# Patient Record
Sex: Female | Born: 1950
Health system: Southern US, Community
[De-identification: ages and names within clinical notes are randomized; demographics above are authoritative.]

## PROBLEM LIST (undated history)

## (undated) DIAGNOSIS — I639 Cerebral infarction, unspecified: Secondary | ICD-10-CM

## (undated) DIAGNOSIS — J209 Acute bronchitis, unspecified: Secondary | ICD-10-CM

## (undated) DIAGNOSIS — E669 Obesity, unspecified: Secondary | ICD-10-CM

## (undated) DIAGNOSIS — Z8249 Family history of ischemic heart disease and other diseases of the circulatory system: Secondary | ICD-10-CM

## (undated) DIAGNOSIS — G2581 Restless legs syndrome: Secondary | ICD-10-CM

## (undated) DIAGNOSIS — K589 Irritable bowel syndrome without diarrhea: Secondary | ICD-10-CM

## (undated) DIAGNOSIS — R079 Chest pain, unspecified: Secondary | ICD-10-CM

## (undated) DIAGNOSIS — M1990 Unspecified osteoarthritis, unspecified site: Secondary | ICD-10-CM

## (undated) DIAGNOSIS — F329 Major depressive disorder, single episode, unspecified: Secondary | ICD-10-CM

## (undated) DIAGNOSIS — I509 Heart failure, unspecified: Secondary | ICD-10-CM

## (undated) DIAGNOSIS — F32A Depression, unspecified: Secondary | ICD-10-CM

## (undated) DIAGNOSIS — J45909 Unspecified asthma, uncomplicated: Secondary | ICD-10-CM

## (undated) DIAGNOSIS — I5032 Chronic diastolic (congestive) heart failure: Secondary | ICD-10-CM

## (undated) DIAGNOSIS — I10 Essential (primary) hypertension: Secondary | ICD-10-CM

## (undated) DIAGNOSIS — G4733 Obstructive sleep apnea (adult) (pediatric): Secondary | ICD-10-CM

## (undated) DIAGNOSIS — F419 Anxiety disorder, unspecified: Secondary | ICD-10-CM

## (undated) DIAGNOSIS — E538 Deficiency of other specified B group vitamins: Secondary | ICD-10-CM

## (undated) DIAGNOSIS — D649 Anemia, unspecified: Secondary | ICD-10-CM

## (undated) DIAGNOSIS — J449 Chronic obstructive pulmonary disease, unspecified: Secondary | ICD-10-CM

## (undated) HISTORY — PX: TUBAL LIGATION: SHX77

## (undated) HISTORY — DX: Heart failure, unspecified: I50.9

## (undated) HISTORY — DX: Obesity, unspecified: E66.9

## (undated) HISTORY — DX: Chronic diastolic (congestive) heart failure: I50.32

## (undated) HISTORY — DX: Essential (primary) hypertension: I10

## (undated) HISTORY — DX: Anxiety disorder, unspecified: F41.9

## (undated) HISTORY — DX: Acute bronchitis, unspecified: J20.9

## (undated) HISTORY — DX: Depression, unspecified: F32.A

## (undated) HISTORY — DX: Deficiency of other specified B group vitamins: E53.8

## (undated) HISTORY — DX: Major depressive disorder, single episode, unspecified: F32.9

## (undated) HISTORY — DX: Restless legs syndrome: G25.81

## (undated) HISTORY — DX: Chronic obstructive pulmonary disease, unspecified: J44.9

## (undated) HISTORY — DX: Unspecified asthma, uncomplicated: J45.909

## (undated) HISTORY — DX: Cerebral infarction, unspecified: I63.9

## (undated) HISTORY — DX: Family history of ischemic heart disease and other diseases of the circulatory system: Z82.49

## (undated) HISTORY — PX: CHOLECYSTECTOMY: SHX55

## (undated) HISTORY — DX: Obstructive sleep apnea (adult) (pediatric): G47.33

## (undated) HISTORY — DX: Chest pain, unspecified: R07.9

## (undated) HISTORY — DX: Irritable bowel syndrome, unspecified: K58.9

---

## 1993-05-20 DIAGNOSIS — I639 Cerebral infarction, unspecified: Secondary | ICD-10-CM

## 1993-05-20 HISTORY — DX: Cerebral infarction, unspecified: I63.9

## 1996-05-20 HISTORY — PX: OTHER SURGICAL HISTORY: SHX169

## 2004-08-27 ENCOUNTER — Encounter: Payer: Self-pay | Admitting: General Practice

## 2004-09-17 ENCOUNTER — Encounter: Payer: Self-pay | Admitting: General Practice

## 2004-09-26 ENCOUNTER — Ambulatory Visit: Payer: Self-pay | Admitting: Specialist

## 2004-10-12 ENCOUNTER — Ambulatory Visit: Payer: Self-pay | Admitting: Unknown Physician Specialty

## 2004-12-04 ENCOUNTER — Ambulatory Visit: Payer: Self-pay

## 2005-03-07 ENCOUNTER — Ambulatory Visit: Payer: Self-pay

## 2005-05-02 ENCOUNTER — Ambulatory Visit: Payer: Self-pay

## 2006-06-24 ENCOUNTER — Emergency Department: Payer: Self-pay | Admitting: Unknown Physician Specialty

## 2006-06-24 ENCOUNTER — Other Ambulatory Visit: Payer: Self-pay

## 2007-03-21 ENCOUNTER — Other Ambulatory Visit: Payer: Self-pay

## 2007-03-21 ENCOUNTER — Emergency Department: Payer: Self-pay | Admitting: Emergency Medicine

## 2007-07-05 ENCOUNTER — Ambulatory Visit: Payer: Self-pay | Admitting: Family Medicine

## 2007-07-13 ENCOUNTER — Ambulatory Visit: Payer: Self-pay | Admitting: Family Medicine

## 2007-08-27 ENCOUNTER — Ambulatory Visit: Payer: Self-pay | Admitting: Family Medicine

## 2007-11-25 ENCOUNTER — Ambulatory Visit: Payer: Self-pay | Admitting: General Practice

## 2008-03-02 ENCOUNTER — Ambulatory Visit: Payer: Self-pay | Admitting: General Practice

## 2008-03-03 ENCOUNTER — Ambulatory Visit: Payer: Self-pay | Admitting: Family Medicine

## 2008-08-02 ENCOUNTER — Ambulatory Visit: Payer: Self-pay | Admitting: Family Medicine

## 2008-09-05 ENCOUNTER — Inpatient Hospital Stay: Payer: Self-pay | Admitting: Internal Medicine

## 2008-09-20 ENCOUNTER — Ambulatory Visit: Payer: Self-pay | Admitting: Family Medicine

## 2008-12-02 ENCOUNTER — Ambulatory Visit: Payer: Self-pay | Admitting: Family Medicine

## 2009-01-10 ENCOUNTER — Ambulatory Visit: Payer: Self-pay | Admitting: Family Medicine

## 2009-03-23 ENCOUNTER — Ambulatory Visit: Payer: Self-pay | Admitting: Family Medicine

## 2010-01-05 ENCOUNTER — Ambulatory Visit: Payer: Self-pay | Admitting: Family Medicine

## 2010-02-13 ENCOUNTER — Other Ambulatory Visit: Payer: Self-pay | Admitting: Family Medicine

## 2010-02-13 ENCOUNTER — Ambulatory Visit: Payer: Self-pay | Admitting: Cardiology

## 2010-03-15 ENCOUNTER — Other Ambulatory Visit: Payer: Self-pay | Admitting: Family Medicine

## 2010-04-04 ENCOUNTER — Ambulatory Visit: Payer: Self-pay | Admitting: Family Medicine

## 2010-05-04 ENCOUNTER — Emergency Department: Payer: Self-pay | Admitting: Emergency Medicine

## 2010-06-19 ENCOUNTER — Ambulatory Visit: Payer: Self-pay | Admitting: Family Medicine

## 2010-07-03 ENCOUNTER — Ambulatory Visit: Payer: Self-pay

## 2010-08-03 ENCOUNTER — Ambulatory Visit: Payer: Self-pay | Admitting: General Practice

## 2010-08-10 ENCOUNTER — Ambulatory Visit: Payer: Self-pay | Admitting: General Practice

## 2010-08-28 ENCOUNTER — Inpatient Hospital Stay: Payer: Self-pay | Admitting: Internal Medicine

## 2010-09-03 LAB — PATHOLOGY REPORT

## 2010-10-28 ENCOUNTER — Ambulatory Visit: Payer: Self-pay

## 2010-11-04 ENCOUNTER — Ambulatory Visit: Payer: Self-pay | Admitting: Family Medicine

## 2010-11-30 ENCOUNTER — Ambulatory Visit: Payer: Self-pay | Admitting: General Practice

## 2010-12-17 ENCOUNTER — Ambulatory Visit: Payer: Self-pay | Admitting: General Practice

## 2011-01-07 ENCOUNTER — Encounter: Payer: Self-pay | Admitting: General Practice

## 2011-01-19 ENCOUNTER — Encounter: Payer: Self-pay | Admitting: General Practice

## 2012-01-22 ENCOUNTER — Emergency Department: Payer: Self-pay | Admitting: Emergency Medicine

## 2012-01-22 LAB — CBC WITH DIFFERENTIAL/PLATELET
Basophil #: 0.1 10*3/uL (ref 0.0–0.1)
Eosinophil #: 0.3 10*3/uL (ref 0.0–0.7)
HCT: 35.8 % (ref 35.0–47.0)
HGB: 12 g/dL (ref 12.0–16.0)
Lymphocyte %: 24.7 %
MCHC: 33.4 g/dL (ref 32.0–36.0)
MCV: 83 fL (ref 80–100)
Monocyte #: 0.6 x10 3/mm (ref 0.2–0.9)
Neutrophil #: 5.8 10*3/uL (ref 1.4–6.5)
Neutrophil %: 64.7 %
RBC: 4.33 10*6/uL (ref 3.80–5.20)
RDW: 14.3 % (ref 11.5–14.5)
WBC: 9 10*3/uL (ref 3.6–11.0)

## 2012-01-22 LAB — COMPREHENSIVE METABOLIC PANEL
Albumin: 3.3 g/dL — ABNORMAL LOW (ref 3.4–5.0)
Alkaline Phosphatase: 135 U/L (ref 50–136)
Bilirubin,Total: 0.3 mg/dL (ref 0.2–1.0)
Chloride: 106 mmol/L (ref 98–107)
Creatinine: 0.97 mg/dL (ref 0.60–1.30)
EGFR (African American): >60
EGFR (Non-African Amer.): >60
Glucose: 89 mg/dL (ref 65–99)
Osmolality: 281 (ref 275–301)
Potassium: 3.9 mmol/L (ref 3.5–5.1)
SGOT(AST): 21 U/L (ref 15–37)
SGPT (ALT): 22 U/L (ref 12–78)
Sodium: 141 mmol/L (ref 136–145)
Total Protein: 7.5 g/dL (ref 6.4–8.2)

## 2012-01-22 LAB — PROTIME-INR
INR: 1
Prothrombin Time: 13.6 secs (ref 11.5–14.7)

## 2012-01-22 LAB — APTT: Activated PTT: 28 secs (ref 23.6–35.9)

## 2012-01-24 ENCOUNTER — Other Ambulatory Visit: Payer: Self-pay | Admitting: Family Medicine

## 2012-01-24 LAB — COMPREHENSIVE METABOLIC PANEL
Alkaline Phosphatase: 131 U/L (ref 50–136)
Anion Gap: 5 — ABNORMAL LOW (ref 7–16)
Bilirubin,Total: 0.3 mg/dL (ref 0.2–1.0)
Calcium, Total: 9.2 mg/dL (ref 8.5–10.1)
Creatinine: 0.97 mg/dL (ref 0.60–1.30)
EGFR (African American): >60
EGFR (Non-African Amer.): >60
Glucose: 84 mg/dL (ref 65–99)
Potassium: 4.2 mmol/L (ref 3.5–5.1)
SGOT(AST): 20 U/L (ref 15–37)

## 2012-02-26 ENCOUNTER — Emergency Department: Payer: Self-pay | Admitting: Emergency Medicine

## 2012-02-26 LAB — URINALYSIS, COMPLETE
Bilirubin,UR: NEGATIVE
Ketone: NEGATIVE
Protein: NEGATIVE
RBC,UR: 1 /HPF (ref 0–5)
WBC UR: 1 /HPF (ref 0–5)

## 2012-02-26 LAB — COMPREHENSIVE METABOLIC PANEL
Anion Gap: 9 (ref 7–16)
BUN: 9 mg/dL (ref 7–18)
Bilirubin,Total: 0.4 mg/dL (ref 0.2–1.0)
Chloride: 104 mmol/L (ref 98–107)
Creatinine: 0.82 mg/dL (ref 0.60–1.30)
EGFR (Non-African Amer.): >60
Osmolality: 284 (ref 275–301)
Potassium: 3.2 mmol/L — ABNORMAL LOW (ref 3.5–5.1)
Sodium: 143 mmol/L (ref 136–145)
Total Protein: 7.3 g/dL (ref 6.4–8.2)

## 2012-02-26 LAB — CBC
HCT: 37.2 % (ref 35.0–47.0)
MCH: 26.7 pg (ref 26.0–34.0)
Platelet: 255 10*3/uL (ref 150–440)
RBC: 4.55 10*6/uL (ref 3.80–5.20)
WBC: 6.5 10*3/uL (ref 3.6–11.0)

## 2012-02-26 LAB — TROPONIN I: Troponin-I: <0.02 ng/mL

## 2012-04-27 ENCOUNTER — Ambulatory Visit: Payer: Self-pay | Admitting: General Practice

## 2012-04-27 LAB — BASIC METABOLIC PANEL
Co2: 29 mmol/L (ref 21–32)
Creatinine: 0.87 mg/dL (ref 0.60–1.30)
EGFR (African American): >60
EGFR (Non-African Amer.): >60
Osmolality: 276 (ref 275–301)
Sodium: 138 mmol/L (ref 136–145)

## 2012-04-27 LAB — URINALYSIS, COMPLETE
Bilirubin,UR: NEGATIVE
Glucose,UR: NEGATIVE mg/dL (ref 0–75)
Hyaline Cast: 1
Specific Gravity: 1.014 (ref 1.003–1.030)
Squamous Epithelial: 2

## 2012-04-27 LAB — CBC
HCT: 35.6 % (ref 35.0–47.0)
HGB: 11.7 g/dL — ABNORMAL LOW (ref 12.0–16.0)
MCV: 80 fL (ref 80–100)
Platelet: 237 10*3/uL (ref 150–440)
RBC: 4.44 10*6/uL (ref 3.80–5.20)
WBC: 7.7 10*3/uL (ref 3.6–11.0)

## 2012-04-27 LAB — PROTIME-INR: INR: 1

## 2012-04-29 LAB — URINE CULTURE

## 2012-05-11 ENCOUNTER — Inpatient Hospital Stay: Payer: Self-pay | Admitting: General Practice

## 2012-05-12 LAB — BASIC METABOLIC PANEL
Anion Gap: 6 — ABNORMAL LOW (ref 7–16)
BUN: 13 mg/dL (ref 7–18)
Chloride: 104 mmol/L (ref 98–107)
Co2: 27 mmol/L (ref 21–32)
Creatinine: 1.01 mg/dL (ref 0.60–1.30)
EGFR (African American): >60
Glucose: 111 mg/dL — ABNORMAL HIGH (ref 65–99)
Potassium: 3.8 mmol/L (ref 3.5–5.1)
Sodium: 137 mmol/L (ref 136–145)

## 2012-05-13 LAB — BASIC METABOLIC PANEL
Anion Gap: 6 — ABNORMAL LOW (ref 7–16)
Calcium, Total: 8.2 mg/dL — ABNORMAL LOW (ref 8.5–10.1)
Chloride: 103 mmol/L (ref 98–107)
Co2: 27 mmol/L (ref 21–32)
Creatinine: 0.97 mg/dL (ref 0.60–1.30)
EGFR (African American): >60
Osmolality: 272 (ref 275–301)
Potassium: 3.8 mmol/L (ref 3.5–5.1)

## 2012-05-13 LAB — PLATELET COUNT: Platelet: 213 10*3/uL (ref 150–440)

## 2012-05-20 HISTORY — PX: JOINT REPLACEMENT: SHX530

## 2012-05-20 HISTORY — PX: TOTAL KNEE ARTHROPLASTY: SHX125

## 2012-06-01 ENCOUNTER — Encounter: Payer: Self-pay | Admitting: General Practice

## 2012-06-20 ENCOUNTER — Encounter: Payer: Self-pay | Admitting: General Practice

## 2013-06-04 ENCOUNTER — Ambulatory Visit: Payer: Self-pay | Admitting: Family Medicine

## 2013-09-22 ENCOUNTER — Inpatient Hospital Stay: Payer: Self-pay | Admitting: Internal Medicine

## 2013-09-22 LAB — CBC WITH DIFFERENTIAL/PLATELET
BASOS ABS: 0.1 10*3/uL (ref 0.0–0.1)
BASOS PCT: 0.8 %
Eosinophil #: 0.1 10*3/uL (ref 0.0–0.7)
Eosinophil %: 1.5 %
HCT: 35.5 % (ref 35.0–47.0)
HGB: 11.3 g/dL — ABNORMAL LOW (ref 12.0–16.0)
LYMPHS PCT: 11.4 %
Lymphocyte #: 1.1 10*3/uL (ref 1.0–3.6)
MCH: 23.9 pg — ABNORMAL LOW (ref 26.0–34.0)
MCHC: 31.9 g/dL — AB (ref 32.0–36.0)
MCV: 75 fL — ABNORMAL LOW (ref 80–100)
Monocyte #: 0.6 x10 3/mm (ref 0.2–0.9)
Monocyte %: 5.8 %
NEUTROS PCT: 80.5 %
Neutrophil #: 8 10*3/uL — ABNORMAL HIGH (ref 1.4–6.5)
Platelet: 180 10*3/uL (ref 150–440)
RBC: 4.74 10*6/uL (ref 3.80–5.20)
RDW: 16 % — AB (ref 11.5–14.5)
WBC: 9.9 10*3/uL (ref 3.6–11.0)

## 2013-09-22 LAB — BASIC METABOLIC PANEL
Anion Gap: 7 (ref 7–16)
BUN: 14 mg/dL (ref 7–18)
CALCIUM: 8.8 mg/dL (ref 8.5–10.1)
CHLORIDE: 100 mmol/L (ref 98–107)
Co2: 25 mmol/L (ref 21–32)
Creatinine: 1.26 mg/dL (ref 0.60–1.30)
GFR CALC AF AMER: 53 — AB
GFR CALC NON AF AMER: 46 — AB
Glucose: 114 mg/dL — ABNORMAL HIGH (ref 65–99)
Osmolality: 266 (ref 275–301)
POTASSIUM: 4.4 mmol/L (ref 3.5–5.1)
Sodium: 132 mmol/L — ABNORMAL LOW (ref 136–145)

## 2013-09-22 LAB — TROPONIN I: Troponin-I: <0.02 ng/mL

## 2013-09-26 LAB — CBC WITH DIFFERENTIAL/PLATELET
BASOS PCT: 0.1 %
Basophil #: 0 10*3/uL (ref 0.0–0.1)
EOS ABS: 0 10*3/uL (ref 0.0–0.7)
Eosinophil %: 0.1 %
HCT: 33.3 % — ABNORMAL LOW (ref 35.0–47.0)
HGB: 10.3 g/dL — AB (ref 12.0–16.0)
Lymphocyte #: 1.1 10*3/uL (ref 1.0–3.6)
Lymphocyte %: 8.8 %
MCH: 23.2 pg — AB (ref 26.0–34.0)
MCHC: 30.9 g/dL — ABNORMAL LOW (ref 32.0–36.0)
MCV: 75 fL — AB (ref 80–100)
Monocyte #: 0.5 x10 3/mm (ref 0.2–0.9)
Monocyte %: 3.7 %
NEUTROS ABS: 11.3 10*3/uL — AB (ref 1.4–6.5)
Neutrophil %: 87.3 %
Platelet: 241 10*3/uL (ref 150–440)
RBC: 4.44 10*6/uL (ref 3.80–5.20)
RDW: 16.4 % — ABNORMAL HIGH (ref 11.5–14.5)
WBC: 12.9 10*3/uL — ABNORMAL HIGH (ref 3.6–11.0)

## 2013-09-26 LAB — BASIC METABOLIC PANEL
ANION GAP: 2 — AB (ref 7–16)
BUN: 24 mg/dL — AB (ref 7–18)
Calcium, Total: 8.7 mg/dL (ref 8.5–10.1)
Chloride: 105 mmol/L (ref 98–107)
Co2: 28 mmol/L (ref 21–32)
Creatinine: 1.24 mg/dL (ref 0.60–1.30)
EGFR (African American): 54 — ABNORMAL LOW
EGFR (Non-African Amer.): 47 — ABNORMAL LOW
GLUCOSE: 143 mg/dL — AB (ref 65–99)
Osmolality: 277 (ref 275–301)
Potassium: 4.4 mmol/L (ref 3.5–5.1)
Sodium: 135 mmol/L — ABNORMAL LOW (ref 136–145)

## 2013-10-05 ENCOUNTER — Ambulatory Visit: Payer: Self-pay | Admitting: Family Medicine

## 2013-11-29 ENCOUNTER — Institutional Professional Consult (permissible substitution): Payer: Self-pay | Admitting: Internal Medicine

## 2014-01-11 ENCOUNTER — Institutional Professional Consult (permissible substitution): Payer: Self-pay | Admitting: Pulmonary Disease

## 2014-03-08 ENCOUNTER — Ambulatory Visit: Payer: Self-pay | Admitting: Family Medicine

## 2014-03-08 LAB — TSH: Thyroid Stimulating Horm: 5.63 u[IU]/mL — ABNORMAL HIGH

## 2014-03-22 ENCOUNTER — Inpatient Hospital Stay: Payer: Self-pay | Admitting: Internal Medicine

## 2014-03-22 LAB — CBC WITH DIFFERENTIAL/PLATELET
BASOS PCT: 0.6 %
Basophil #: 0.1 10*3/uL (ref 0.0–0.1)
EOS PCT: 0.6 %
Eosinophil #: 0.1 10*3/uL (ref 0.0–0.7)
HCT: 34.6 % — AB (ref 35.0–47.0)
HGB: 10.6 g/dL — ABNORMAL LOW (ref 12.0–16.0)
Lymphocyte #: 1.2 10*3/uL (ref 1.0–3.6)
Lymphocyte %: 8.4 %
MCH: 22.6 pg — AB (ref 26.0–34.0)
MCHC: 30.6 g/dL — ABNORMAL LOW (ref 32.0–36.0)
MCV: 74 fL — ABNORMAL LOW (ref 80–100)
Monocyte #: 0.5 x10 3/mm (ref 0.2–0.9)
Monocyte %: 3.5 %
Neutrophil #: 12.3 10*3/uL — ABNORMAL HIGH (ref 1.4–6.5)
Neutrophil %: 86.9 %
PLATELETS: 240 10*3/uL (ref 150–440)
RBC: 4.68 10*6/uL (ref 3.80–5.20)
RDW: 16.6 % — ABNORMAL HIGH (ref 11.5–14.5)
WBC: 14.2 10*3/uL — AB (ref 3.6–11.0)

## 2014-03-22 LAB — TROPONIN I: Troponin-I: <0.02 ng/mL

## 2014-03-22 LAB — BASIC METABOLIC PANEL
Anion Gap: 13 (ref 7–16)
BUN: 10 mg/dL (ref 7–18)
CALCIUM: 7.9 mg/dL — AB (ref 8.5–10.1)
CHLORIDE: 104 mmol/L (ref 98–107)
CO2: 25 mmol/L (ref 21–32)
Creatinine: 1.23 mg/dL (ref 0.60–1.30)
EGFR (African American): 57 — ABNORMAL LOW
EGFR (Non-African Amer.): 47 — ABNORMAL LOW
GLUCOSE: 184 mg/dL — AB (ref 65–99)
OSMOLALITY: 287 (ref 275–301)
Potassium: 2.9 mmol/L — ABNORMAL LOW (ref 3.5–5.1)
SODIUM: 142 mmol/L (ref 136–145)

## 2014-03-22 LAB — ED INFLUENZA
H1N1 flu by pcr: NOT DETECTED
Influenza A By PCR: NEGATIVE
Influenza B By PCR: NEGATIVE

## 2014-03-23 LAB — COMPREHENSIVE METABOLIC PANEL
ALBUMIN: 2.9 g/dL — AB (ref 3.4–5.0)
ANION GAP: 7 (ref 7–16)
Alkaline Phosphatase: 107 U/L
BILIRUBIN TOTAL: 0.2 mg/dL (ref 0.2–1.0)
BUN: 16 mg/dL (ref 7–18)
CHLORIDE: 102 mmol/L (ref 98–107)
CO2: 31 mmol/L (ref 21–32)
Calcium, Total: 9 mg/dL (ref 8.5–10.1)
Creatinine: 1.17 mg/dL (ref 0.60–1.30)
EGFR (Non-African Amer.): 50 — ABNORMAL LOW
Glucose: 144 mg/dL — ABNORMAL HIGH (ref 65–99)
Osmolality: 283 (ref 275–301)
Potassium: 4.2 mmol/L (ref 3.5–5.1)
SGOT(AST): 18 U/L (ref 15–37)
SGPT (ALT): 15 U/L
SODIUM: 140 mmol/L (ref 136–145)
TOTAL PROTEIN: 7.2 g/dL (ref 6.4–8.2)

## 2014-03-23 LAB — CBC WITH DIFFERENTIAL/PLATELET
BASOS ABS: 0 10*3/uL (ref 0.0–0.1)
BASOS PCT: 0.1 %
EOS ABS: 0 10*3/uL (ref 0.0–0.7)
Eosinophil %: 0.1 %
HCT: 32.6 % — ABNORMAL LOW (ref 35.0–47.0)
HGB: 10.3 g/dL — ABNORMAL LOW (ref 12.0–16.0)
Lymphocyte #: 0.6 10*3/uL — ABNORMAL LOW (ref 1.0–3.6)
Lymphocyte %: 3.6 %
MCH: 23.1 pg — ABNORMAL LOW (ref 26.0–34.0)
MCHC: 31.5 g/dL — ABNORMAL LOW (ref 32.0–36.0)
MCV: 73 fL — AB (ref 80–100)
Monocyte #: 0.4 x10 3/mm (ref 0.2–0.9)
Monocyte %: 2 %
Neutrophil #: 16.7 10*3/uL — ABNORMAL HIGH (ref 1.4–6.5)
Neutrophil %: 94.2 %
Platelet: 257 10*3/uL (ref 150–440)
RBC: 4.45 10*6/uL (ref 3.80–5.20)
RDW: 17.2 % — ABNORMAL HIGH (ref 11.5–14.5)
WBC: 17.7 10*3/uL — AB (ref 3.6–11.0)

## 2014-03-27 LAB — CULTURE, BLOOD (SINGLE)

## 2014-03-29 ENCOUNTER — Encounter: Payer: Self-pay | Admitting: *Deleted

## 2014-04-05 ENCOUNTER — Encounter (INDEPENDENT_AMBULATORY_CARE_PROVIDER_SITE_OTHER): Payer: Self-pay

## 2014-04-05 ENCOUNTER — Ambulatory Visit (INDEPENDENT_AMBULATORY_CARE_PROVIDER_SITE_OTHER): Payer: 59 | Admitting: Cardiovascular Disease

## 2014-04-05 ENCOUNTER — Encounter: Payer: Self-pay | Admitting: Cardiovascular Disease

## 2014-04-05 VITALS — BP 150/80 | HR 88 | Ht 64.0 in | Wt 295.1 lb

## 2014-04-05 DIAGNOSIS — R0602 Shortness of breath: Secondary | ICD-10-CM

## 2014-04-05 NOTE — Patient Instructions (Addendum)
[place]  Your caregiver has ordered a Stress Test with nuclear imaging. The purpose of this test is to evaluate the blood supply to your heart muscle. This procedure is referred to as a "Non-Invasive Stress Test." This is because other than having an IV started in your vein, nothing is inserted or "invades" your body. Cardiac stress tests are done to find areas of poor blood flow to the heart by determining the extent of coronary artery disease (CAD). Some patients exercise on a treadmill, which naturally increases the blood flow to your heart, while others who are  unable to walk on a treadmill due to physical limitations have a pharmacologic/chemical stress agent called Lexiscan . This medicine will mimic walking on a treadmill by temporarily increasing your coronary blood flow.   Please note: these test may take anywhere between 2-4 hours to complete  PLEASE REPORT TO [facility] AT THE FIRST DESK WILL DIRECT YOU WHERE TO GO  Date of Procedure:_________11/20/15____________________________  Arrival Time for Procedure:_______0715 am _______________________   PLEASE NOTIFY THE OFFICE AT LEAST 24 HOURS IN ADVANCE IF YOU ARE UNABLE TO KEEP YOUR APPOINTMENT.  [phone] AND  PLEASE NOTIFY NUCLEAR MEDICINE AT [facility] AT LEAST 24 HOURS IN ADVANCE IF YOU ARE UNABLE TO KEEP YOUR APPOINTMENT. [phone]  How to prepare for your Myoview test:  1. Do not eat or drink after midnight 2. No caffeine for 24 hours prior to test 3. No smoking 24 hours prior to test. 4. Your medication may be taken with water.  If your doctor stopped a medication because of this test, do not take that medication. 5. Ladies, please do not wear dresses.  Skirts or pants are appropriate. Please wear a short sleeve shirt. 6. No perfume, cologne or lotion. 7. Wear comfortable walking shoes. No heels!     Your physician recommends that you schedule a follow-up appointment in:  As needed

## 2014-04-07 ENCOUNTER — Encounter: Payer: Self-pay | Admitting: Cardiovascular Disease

## 2014-04-07 DIAGNOSIS — R0602 Shortness of breath: Secondary | ICD-10-CM | POA: Insufficient documentation

## 2014-04-07 NOTE — Assessment & Plan Note (Signed)
The patient's shortness of breath is likely due to her lung disease. However, she had some nonspecific inferior ST and T-wave changes during recent hospitalization. Chest x-ray showed mild pulmonary vascular congestion. I do not see evidence of heart failure by physical exam. I agree with further ischemic cardiac evaluation. I requested a treadmill nuclear stress test.

## 2014-04-07 NOTE — Progress Notes (Signed)
Primary care physician: Dr. Rutherford Nail  HPI  This is a 63 year old female who was referred from [facility] for evaluation of shortness of breath and abnormal EKG. She was hospitalized early this month with asthma exacerbation shortly after her trip back from [place]. She has been suffering from asthma since 1995. She also had a previous stroke. There is no previous cardiac history. No diabetes, hypertension or hyperlipidemia. She is not a smoker. There is no family history of premature coronary artery disease. She was treated for asthma during her hospitalization and was significantly hypertensive and tachycardic. EKG showed ST and T wave changes in the inferior leads. Cardiac enzymes were negative. Chest x-ray showed pulmonary vascular congestion. Echocardiogram showed normal LV systolic function with no significant valvular abnormalities. She reports overall improvement in symptoms. She continues to have significant exertional dyspnea without chest pain.  Allergies  Allergen Reactions  . Penicillins   . Sterapred [Prednisone]      Current Outpatient Prescriptions on File Prior to Visit  Medication Sig Dispense Refill  . budesonide-formoterol (SYMBICORT) 160-4.5 MCG/ACT inhaler Inhale 2 puffs into the lungs 2 (two) times daily as needed.    . cetirizine (ZYRTEC) 10 MG tablet Take 10 mg by mouth daily.    . chlorpheniramine-HYDROcodone (TUSSIONEX PENNKINETIC ER) 10-8 MG/5ML LQCR Take 5 mLs by mouth every 12 (twelve) hours as needed for cough.    . diphenoxylate-atropine (LOMOTIL) 2.5-0.025 MG per tablet Take 2 tablets by mouth 4 (four) times daily as needed for diarrhea or loose stools.    Marland Kitchen escitalopram (LEXAPRO) 10 MG tablet Take 20 mg by mouth daily.     . Ipratropium-Albuterol (COMBIVENT RESPIMAT) 20-100 MCG/ACT AERS respimat Inhale 1 puff into the lungs 4 (four) times daily as needed for wheezing.    . pantoprazole (PROTONIX) 40 MG tablet Take 40 mg by mouth daily.    Marland Kitchen rOPINIRole (REQUIP) 2  MG tablet Take 2 mg by mouth at bedtime.    Marland Kitchen tiotropium (SPIRIVA) 18 MCG inhalation capsule Place 18 mcg into inhaler and inhale daily.     No current facility-administered medications on file prior to visit.     Past Medical History  Diagnosis Date  . Acute bronchitis   . OSA (obstructive sleep apnea)   . Irritable bowel syndrome   . Restless leg syndrome   . Anxiety and depression   . Stroke   . Obesity   . Malignant hypertension   . Asthma      Past Surgical History  Procedure Laterality Date  . Total knee arthroplasty Bilateral   . Tubal ligation Bilateral   . Cholecystectomy       No family history on file.   History   Social History  . Marital Status: Married    Spouse Name: N/A    Number of Children: N/A  . Years of Education: N/A   Occupational History  . Not on file.   Social History Main Topics  . Smoking status: Never Smoker   . Smokeless tobacco: Not on file  . Alcohol Use: Not on file  . Drug Use: Not on file  . Sexual Activity: Not on file   Other Topics Concern  . Not on file   Social History Narrative     ROS A 10 point review of system was performed. It is negative other than that mentioned in the history of present illness.   PHYSICAL EXAM   BP 150/80 mmHg  Pulse 88  Ht 5\' 4"  (1.626 m)  Wt 295 lb 1.9 oz (133.866 kg)  BMI 50.63 kg/m2 Constitutional: She is oriented to person, place, and time. She appears well-developed and well-nourished. No distress.  HENT: No nasal discharge.  Head: Normocephalic and atraumatic.  Eyes: Pupils are equal and round. No discharge.  Neck: Normal range of motion. Neck supple. No JVD present. No thyromegaly present.  Cardiovascular: Normal rate, regular rhythm, normal heart sounds. Exam reveals no gallop and no friction rub. No murmur heard.  Pulmonary/Chest: Effort normal and breath sounds normal. No stridor. No respiratory distress. She has no wheezes. She has no rales. She exhibits no  tenderness.  Abdominal: Soft. Bowel sounds are normal. She exhibits no distension. There is no tenderness. There is no rebound and no guarding.  Musculoskeletal: Normal range of motion. She exhibits no edema and no tenderness.  Neurological: She is alert and oriented to person, place, and time. Coordination normal.  Skin: Skin is warm and dry. No rash noted. She is not diaphoretic. No erythema. No pallor.  Psychiatric: She has a normal mood and affect. Her behavior is normal. Judgment and thought content normal.     EKG: Normal sinus rhythm with low voltage. No significant ST or T wave changes.   ASSESSMENT AND PLAN

## 2014-04-08 ENCOUNTER — Telehealth: Payer: Self-pay

## 2014-04-08 NOTE — Telephone Encounter (Signed)
Called to let us know this pt did not show up for stress test this am. Due to pt was not feeling well.

## 2014-04-08 NOTE — Telephone Encounter (Signed)
Yes please

## 2014-04-11 NOTE — Telephone Encounter (Signed)
Attempted to call patient to reschedule myoview  No answer no voicemail 11/23

## 2014-04-20 ENCOUNTER — Ambulatory Visit: Payer: Self-pay | Admitting: Family Medicine

## 2014-04-20 LAB — CBC WITH DIFFERENTIAL/PLATELET
Basophil #: 0 10*3/uL (ref 0.0–0.1)
Basophil %: 0.7 %
EOS PCT: 4.2 %
Eosinophil #: 0.2 10*3/uL (ref 0.0–0.7)
HCT: 33.3 % — AB (ref 35.0–47.0)
HGB: 10.2 g/dL — AB (ref 12.0–16.0)
LYMPHS ABS: 1.6 10*3/uL (ref 1.0–3.6)
Lymphocyte %: 29.6 %
MCH: 23.4 pg — AB (ref 26.0–34.0)
MCHC: 30.7 g/dL — AB (ref 32.0–36.0)
MCV: 76 fL — AB (ref 80–100)
MONOS PCT: 6.8 %
Monocyte #: 0.4 x10 3/mm (ref 0.2–0.9)
Neutrophil #: 3.2 10*3/uL (ref 1.4–6.5)
Neutrophil %: 58.7 %
PLATELETS: 267 10*3/uL (ref 150–440)
RBC: 4.37 10*6/uL (ref 3.80–5.20)
RDW: 17.4 % — ABNORMAL HIGH (ref 11.5–14.5)
WBC: 5.5 10*3/uL (ref 3.6–11.0)

## 2014-04-20 LAB — COMPREHENSIVE METABOLIC PANEL
Albumin: 2.9 g/dL — ABNORMAL LOW (ref 3.4–5.0)
Alkaline Phosphatase: 140 U/L — ABNORMAL HIGH
Anion Gap: 7 (ref 7–16)
BUN: 13 mg/dL (ref 7–18)
Bilirubin,Total: 0.4 mg/dL (ref 0.2–1.0)
CHLORIDE: 106 mmol/L (ref 98–107)
Calcium, Total: 8.5 mg/dL (ref 8.5–10.1)
Co2: 28 mmol/L (ref 21–32)
Creatinine: 1.13 mg/dL (ref 0.60–1.30)
EGFR (African American): >60
EGFR (Non-African Amer.): 52 — ABNORMAL LOW
GLUCOSE: 104 mg/dL — AB (ref 65–99)
OSMOLALITY: 282 (ref 275–301)
POTASSIUM: 4.4 mmol/L (ref 3.5–5.1)
SGOT(AST): 23 U/L (ref 15–37)
SGPT (ALT): 21 U/L
SODIUM: 141 mmol/L (ref 136–145)
Total Protein: 6.8 g/dL (ref 6.4–8.2)

## 2014-04-29 ENCOUNTER — Telehealth: Payer: Self-pay

## 2014-05-10 NOTE — Telephone Encounter (Signed)
lmam 05/10/14

## 2014-05-18 NOTE — Telephone Encounter (Signed)
appt 01/16 with Mungal

## 2014-05-25 ENCOUNTER — Ambulatory Visit: Payer: Self-pay | Admitting: Family Medicine

## 2014-05-25 LAB — COMPREHENSIVE METABOLIC PANEL
ALBUMIN: 3.2 g/dL — AB (ref 3.4–5.0)
ALK PHOS: 130 U/L — AB
Anion Gap: 7 (ref 7–16)
BUN: 15 mg/dL (ref 7–18)
Bilirubin,Total: 0.3 mg/dL (ref 0.2–1.0)
CALCIUM: 8.6 mg/dL (ref 8.5–10.1)
CHLORIDE: 106 mmol/L (ref 98–107)
CO2: 28 mmol/L (ref 21–32)
Creatinine: 1.24 mg/dL (ref 0.60–1.30)
EGFR (African American): 56 — ABNORMAL LOW
EGFR (Non-African Amer.): 47 — ABNORMAL LOW
Glucose: 86 mg/dL (ref 65–99)
Osmolality: 281 (ref 275–301)
Potassium: 3.6 mmol/L (ref 3.5–5.1)
SGOT(AST): 19 U/L (ref 15–37)
SGPT (ALT): 15 U/L
Sodium: 141 mmol/L (ref 136–145)
Total Protein: 7.2 g/dL (ref 6.4–8.2)

## 2014-05-31 ENCOUNTER — Ambulatory Visit (INDEPENDENT_AMBULATORY_CARE_PROVIDER_SITE_OTHER): Payer: 59 | Admitting: Internal Medicine

## 2014-05-31 ENCOUNTER — Encounter: Payer: Self-pay | Admitting: Internal Medicine

## 2014-05-31 VITALS — BP 142/78 | HR 91 | Temp 98.1°F | Ht 64.0 in | Wt 293.0 lb

## 2014-05-31 DIAGNOSIS — Z6841 Body Mass Index (BMI) 40.0 and over, adult: Secondary | ICD-10-CM | POA: Insufficient documentation

## 2014-05-31 DIAGNOSIS — J4530 Mild persistent asthma, uncomplicated: Secondary | ICD-10-CM

## 2014-05-31 DIAGNOSIS — R0602 Shortness of breath: Secondary | ICD-10-CM

## 2014-05-31 NOTE — Progress Notes (Signed)
Date: 05/31/2014  MRN# [id number] Samantha Mason [birth year]  Referring Physician: Dr. Hall Busing is a 64 y.o. old female seen in consultation for asthma optimization  CC:  Chief Complaint  Patient presents with  . Advice Only    pt referred by Dr. Rutherford Nail for sob. She had pneumo in 5/15 and was hospitalized and in Nov 15 bronchitis and was hospitalized. She is still having sob with exhertion. Pt has sl. wheezing. She denies chest tightness.    HPI: (prior hospitalizations and imaging reviewed).  Patient is a pleasant 64 yo female referred by Dr. Rutherford Nail for optimization of Asthma.  Patient was diagnosed with Asthma in 1995 (sob with rest and exertion for a day, went to the ED, found have a pneumonia and low hemoglobin, was advised to use albuterol as needed, see Dr. Alveta Heimlich in the interim, who clinically diagnosed her with asthma).  Her  triggers are sinus infection which leads to bronchitis and then asthma "attack" In 2014 had one episode of bronchitis, was treated with steroids and nebs, also started on Symbicort at that time. In May 2015 had bronchitis which lead pneumonia and was then hospitalized for 1 week, given IV antibiotic, steroids, nebulizers and discharged on Combivent, Symbicort and steroid taper. Between May 2015 and Nov 2015, patient was doing well, not using rescue inhaler monthly.  In Nov 2015 had another bronchitis episode with severe SOB, went to ED, noted to have mild EKG changes and was admitted for further observation.  Just prior to admission in May and Nov 2015, patient and her mother visited [place] ([place], [place]), and shortly became ill after both visits; this had not occurred during prior visits to [place]. Patient has been visiting [place] since 2000 to visit her daughter, in 55 was the first time she had 3 visits in one year.  Patient usually visits [place] during the summer.  In the visit of Nov 2015, trees were recently cut down  and the soil was turned up. Patient visited [place] in Aug 2015 for a weekend, did not have any symptoms after the visit.   After her Nov hospital admission, spiriva was added to her asthma regiment. During hospitalization in May 2015 she was given positive pressure with Bipap.  During Nov admission, was noted to have some mild vascular congestion, but with normal ECHO, saw [facility] cardiology, scheduled for a stress test, which is currently pending.  Patient has not had PFTs in 20years, she has office spirometry at [place] office in April 2015. Patient has a history of OSA on CPAP for the past 5 years, wears CPAP machine nightly. Patient endorses dyspnea with exertion. Can walk about 20yards or one flight of stair before experiencing dyspnea.  Has mild leg swelling if eating salty food Asthma triggers - environmental (heavy rain, cool air, pollen, ragweed) Pets - 2 dogs, 1 cat   PMHX:   Past Medical History  Diagnosis Date  . Acute bronchitis   . OSA (obstructive sleep apnea)   . Irritable bowel syndrome   . Restless leg syndrome   . Anxiety and depression   . Stroke   . Obesity   . Malignant hypertension   . Asthma    Surgical Hx:  Past Surgical History  Procedure Laterality Date  . Total knee arthroplasty Bilateral   . Tubal ligation Bilateral   . Cholecystectomy     Family Hx:  No family history on file. Social Hx:   History  Substance Use Topics  . Smoking status: Never Smoker   . Smokeless tobacco: Not on file  . Alcohol Use: Not on file   Medication:   Current Outpatient Rx  Name  Route  Sig  Dispense  Refill  . budesonide-formoterol (SYMBICORT) 160-4.5 MCG/ACT inhaler   Inhalation   Inhale 2 puffs into the lungs 2 (two) times daily as needed.         . cetirizine (ZYRTEC) 10 MG tablet   Oral   Take 10 mg by mouth daily.         . cholestyramine (QUESTRAN) 4 GM/DOSE powder      daily.      5   . diphenoxylate-atropine (LOMOTIL) 2.5-0.025 MG per tablet    Oral   Take 2 tablets by mouth 4 (four) times daily as needed for diarrhea or loose stools.         Marland Kitchen escitalopram (LEXAPRO) 10 MG tablet   Oral   Take 20 mg by mouth daily.          . furosemide (LASIX) 20 MG tablet   Oral   Take 20 mg by mouth daily as needed.         Marland Kitchen NUVIGIL 200 MG TABS   Oral   Take 200 mg by mouth daily.      2   . pantoprazole (PROTONIX) 40 MG tablet   Oral   Take 40 mg by mouth daily.         . potassium chloride SA (K-DUR,KLOR-CON) 20 MEQ tablet   Oral   Take 20 mEq by mouth daily.         Marland Kitchen rOPINIRole (REQUIP) 2 MG tablet   Oral   Take 2 mg by mouth at bedtime.         . Ipratropium-Albuterol (COMBIVENT RESPIMAT) 20-100 MCG/ACT AERS respimat   Inhalation   Inhale 1 puff into the lungs 4 (four) times daily as needed for wheezing.         . tiotropium (SPIRIVA) 18 MCG inhalation capsule   Inhalation   Place 18 mcg into inhaler and inhale daily.             Allergies:  Penicillins and Sterapred  Review of Systems: Gen:  Denies  fever, sweats, chills HEENT: Denies blurred vision, double vision, ear pain, eye pain, hearing loss, nose bleeds, sore throat Cvc:  No dizziness, chest pain or heaviness Resp:   SOB, mild cough Gi: Denies swallowing difficulty, stomach pain, nausea or vomiting, diarrhea, constipation, bowel incontinence Gu:  Denies bladder incontinence, burning urine Ext:   No Joint pain, stiffness or swelling Skin: No skin rash, easy bruising or bleeding or hives Endoc:  No polyuria, polydipsia , polyphagia or weight change Psych: No depression, insomnia or hallucinations  Other:  All other systems negative  Physical Examination:   VS: BP 142/78 mmHg  Pulse 91  Temp(Src) 98.1 F (36.7 C) (Oral)  Ht 5\' 4"  (1.626 m)  Wt 293 lb (132.904 kg)  BMI 50.27 kg/m2  SpO2 98%  General Appearance: No distress  Neuro:without focal findings, mental status, speech normal, alert and oriented, cranial nerves 2-12  intact, reflexes normal and symmetric, sensation grossly normal  HEENT: PERRLA, EOM intact, no ptosis, no other lesions noticed; Mallampati 3 Pulmonary: normal breath sounds., diaphragmatic excursion normal.No wheezing, No rales;   Sputum Production:none   CardiovascularNormal S1,S2.  No m/r/g.  Abdominal aorta pulsation normal.    Abdomen: Benign, Soft, non-tender, No masses,  hepatosplenomegaly, No lymphadenopathy Renal:  No costovertebral tenderness  GU:  No performed at this time. Endoc: No evident thyromegaly, no signs of acromegaly or Cushing features Skin:   warm, no rashes, no ecchymosis  Extremities: normal, no cyanosis, clubbing, no edema, warm with normal capillary refill. Other findings:none   Rad results: (The following images and results were reviewed by Dr. Stevenson Clinch). CXR 03/23/14 FINDINGS: Mild vascular congestion without edema or effusion. Negative for pneumonia.  IMPRESSION: Mild vascular congestion without edema.  CXR 10/05/13 FINDINGS: Mild interstitial prominence noted bilaterally. This have improved from prior exam and is consistent with improving pneumonitis/ bronchitis. Heart size stable. No pleural effusion or pneumothorax. No acute osseus abnormality   IMPRESSION: Findings consistent with improving interstitial pneumonitis/bronchitis.    Assessment and Plan: Asthma, chronic Patient with know diagnosis of asthma, with good control prior to 2014. I believe her recent asthma exacerbations has a temporal relationship to her travels to [place] (possible sick contact exposure, allergens\triggers, mold, mildew, fungus) Currently will plan the following - stop spiriva - cont with symbicort - avoid asthma triggers - order for HRCT (given persistent infections, evaluate for any bronchiectasis or atelectasis) - PFTs\6MWT - goal o her asthma is to reduce exacerbations\ED\Urgent care visits\use of prednisone - best controller at this time would be Symbicort and  PRN albuterol (both these medication reviewed with patient, use and administration).  - cont with CPAP compliance - weight loss (patient recently joined [facility] wt loss service, will plan to have diet modifications and exercise)  Obesity, morbid OBESITY  Wt: 293 BMI:50 Discussed importance of weight reduction.  Educated regarding limitation of  intake of greasy/fried foods.  Instructed on benefit of  a low-impact exercise program, starting slowly.  Discussed benefits of 30-45 minutes of some form of exercise daily as well as benefit of supervised exercise program.  - weight loss (patient recently joined [facility] wt loss service, will plan to have diet modifications and exercise)     Updated Medication List Outpatient Encounter Prescriptions as of 05/31/2014  Medication Sig  . budesonide-formoterol (SYMBICORT) 160-4.5 MCG/ACT inhaler Inhale 2 puffs into the lungs 2 (two) times daily as needed.  . cetirizine (ZYRTEC) 10 MG tablet Take 10 mg by mouth daily.  . cholestyramine (QUESTRAN) 4 GM/DOSE powder daily.  . diphenoxylate-atropine (LOMOTIL) 2.5-0.025 MG per tablet Take 2 tablets by mouth 4 (four) times daily as needed for diarrhea or loose stools.  Marland Kitchen escitalopram (LEXAPRO) 10 MG tablet Take 20 mg by mouth daily.   . furosemide (LASIX) 20 MG tablet Take 20 mg by mouth daily as needed.  Marland Kitchen NUVIGIL 200 MG TABS Take 200 mg by mouth daily.  . pantoprazole (PROTONIX) 40 MG tablet Take 40 mg by mouth daily.  . potassium chloride SA (K-DUR,KLOR-CON) 20 MEQ tablet Take 20 mEq by mouth daily.  Marland Kitchen rOPINIRole (REQUIP) 2 MG tablet Take 2 mg by mouth at bedtime.  . Ipratropium-Albuterol (COMBIVENT RESPIMAT) 20-100 MCG/ACT AERS respimat Inhale 1 puff into the lungs 4 (four) times daily as needed for wheezing.  . tiotropium (SPIRIVA) 18 MCG inhalation capsule Place 18 mcg into inhaler and inhale daily.  . [DISCONTINUED] chlorpheniramine-HYDROcodone (TUSSIONEX PENNKINETIC ER) 10-8 MG/5ML LQCR Take 5 mLs by  mouth every 12 (twelve) hours as needed for cough.    Orders for this visit: Orders Placed This Encounter  Procedures  . CT Chest High Resolution    Standing Status: Future     Number of Occurrences:      Standing Expiration Date:  07/30/2015    Scheduling Instructions:     Please schedule at [facility]    Order Specific Question:  Reason for Exam (SYMPTOM  OR DIAGNOSIS REQUIRED)    Answer:  sob/ cough    Order Specific Question:  Preferred imaging location?    Answer:  [facility]  . Pulmonary function test    Standing Status: Future     Number of Occurrences:      Standing Expiration Date: 06/01/2015    Scheduling Instructions:     Please schedule at [facility] office/ not scheduled yet.    Order Specific Question:  Where should this test be performed?    Answer:  [facility]    Order Specific Question:  Full PFT: includes the following: basic spirometry, spirometry pre & post bronchodilator, diffusion capacity (DLCO), lung volumes    Answer:  Full PFT    Order Specific Question:  MIP/MEP    Answer:  No    Order Specific Question:  6 minute walk    Answer:  Yes    Order Specific Question:  ABG    Answer:  No    Order Specific Question:  Diffusion capacity (DLCO)    Answer:  No    Order Specific Question:  Lung volumes    Answer:  No    Order Specific Question:  Methacholine challenge    Answer:  No     Thank  you for the consultation and for allowing [facility] to assist in the care of your patient. Our recommendations are noted above.  Please contact us if we can be of further service.   Vilinda Boehringer, MD [facility] Office Number: [phone]

## 2014-05-31 NOTE — Assessment & Plan Note (Addendum)
Patient with know diagnosis of asthma, with good control prior to 2014. I believe her recent asthma exacerbations has a temporal relationship to her travels to [place] (possible sick contact exposure, allergens\triggers, mold, mildew, fungus) Currently will plan the following - stop spiriva - cont with symbicort - avoid asthma triggers - order for HRCT (given persistent infections, evaluate for any bronchiectasis or atelectasis) - PFTs\6MWT - goal o her asthma is to reduce exacerbations\ED\Urgent care visits\use of prednisone - best controller at this time would be Symbicort and PRN albuterol (both these medication reviewed with patient, use and administration).  - cont with CPAP compliance - weight loss (patient recently joined [facility] wt loss service, will plan to have diet modifications and exercise)

## 2014-05-31 NOTE — Assessment & Plan Note (Signed)
OBESITY  Wt: 293 BMI:50 Discussed importance of weight reduction.  Educated regarding limitation of  intake of greasy/fried foods.  Instructed on benefit of  a low-impact exercise program, starting slowly.  Discussed benefits of 30-45 minutes of some form of exercise daily as well as benefit of supervised exercise program.  - weight loss (patient recently joined [facility] wt loss service, will plan to have diet modifications and exercise)

## 2014-05-31 NOTE — Patient Instructions (Signed)
We will get you set up for a CT scan. We will schedule you for a breathing and walk test. Stop Spiriva! Continue with Symbicort and Albuterol as needed. We will see you back in 1 month.

## 2014-09-06 NOTE — Op Note (Signed)
PATIENT NAME:  Samantha Mason, Samantha Mason MR#:  [id number] DATE OF BIRTH:  12-28-1950  DATE OF PROCEDURE:  05/11/2012  PREOPERATIVE DIAGNOSIS: Degenerative arthrosis of the left knee.   POSTOPERATIVE DIAGNOSIS: Degenerative arthrosis of the left knee.   PROCEDURE PERFORMED: Left total knee arthroplasty using computer-assisted navigation.   SURGEON: Laurice Record. Hooten, MD  ASSISTANT: Vance Peper, PA-C (required to maintain retraction throughout the procedure)   ANESTHESIA: Femoral nerve block and general.   ESTIMATED BLOOD LOSS: 50 mL.   FLUIDS REPLACED: 1500 mL of crystalloid.   TOURNIQUET TIME: 103 minutes.   DRAINS: Two medium drains to reinfusion system.   SOFT TISSUE RELEASES: Anterior cruciate ligament, posterior cruciate ligament, deep and superficial medial collateral ligament, and patellofemoral ligament.   IMPLANTS UTILIZED: DePuy PFC Sigma size 3 posterior stabilized femoral component (cemented), size 3 MBT tibial component (cemented), 32 mm three peg oval dome patella (cemented), and a 10 mm stabilized rotating platform polyethylene insert.   INDICATIONS FOR SURGERY: The patient is a 64 year old female who has been followed and seen for complaints of progressive left knee pain. X-rays demonstrated severe degenerative changes in tricompartmental fashion with relative varus deformity. She did not see any significant improvement despite activity modification, previous arthroscopy, and other conservative nonsurgical intervention. After discussion of the risks and benefits of surgical intervention, the patient expressed her understanding of the risks and benefits and agreed with plans for surgical intervention.   PROCEDURE IN DETAIL: The patient was brought to the operating room and, after adequate femoral nerve block and general anesthesia was achieved, a tourniquet was placed on the patient's upper left thigh. The patient's left knee and leg were cleaned and prepped with alcohol and DuraPrep  and draped in the usual sterile fashion. A "timeout" was performed as per usual protocol. The left lower extremity was exsanguinated using an Esmarch, and the tourniquet was inflated to 300 mmHg. An anterior longitudinal incision was made followed by a standard mid vastus approach. A large effusion was evacuated. The deep fibers of the medial collateral ligament were elevated in subperiosteal fashion off the medial flare of the tibia so as to maintain a continuous soft tissue sleeve. The patella was subluxed laterally and patellofemoral ligament was incised. Inspection of the knee demonstrated severe degenerative changes with full thickness loss or articular cartilage to the medial compartment and substantial degenerative changes to the patellofemoral and lateral compartments. Osteophytes were debrided using a rongeur. Anterior and posterior cruciate ligaments were excised. Two 4.0 mm Schanz pins were inserted into the femur and into the tibia for attachment of the array of trackers used for computer-assisted navigation. The hip center was identified using the circumduction technique. Distal landmarks were mapped using the computer. Distal femur and proximal tibia were mapped using the computer. Distal femoral cutting guide was positioned using computer-assisted navigation and the distal cut was performed and verified using the computer. The distal femur was sized and it was felt that a size 3 femoral component was appropriate. Size 3 cutting guide was positioned and the anterior cut was performed and verified using the computer. This was followed by completion of the posterior and chamfer cuts. Femoral cutting guide for the central box was then positioned and the central box cut was performed.   Attention was then directed to the proximal tibia. Medial and lateral menisci were excised. The extramedullary tibial cutting guide was positioned using computer-assisted navigation so as to achieve 0 degree varus valgus  alignment and 0 degree posterior slope. Cut was  performed and verified using the computer. The proximal tibia was sized and it was felt that a size 3 tibial tray was appropriate. Tibial and femoral trials were inserted followed by insertion of a 10 mm polyethylene trial. The knee was felt to be tight medially. A Cobb elevator was used to elevate the superficial fibers of the medial collateral ligament. This allowed for excellent mediolateral soft tissue balancing both in full extension and in flexion. Finally, the patella was cut and prepared so as to accommodate a 32 mm three peg oval dome patella. Patellar trial was placed and the knee was placed through range of motion with excellent patellar tracking appreciated.   Femoral trial was removed. Central post hole for the tibial component was reamed followed by insertion of a keel punch. Tibial trial was then removed. Cut surfaces of bone were irrigated with copious amounts of normal saline with antibiotic solution using pulsatile lavage and then suctioned dry. Polymethyl methacrylate cement with gentamicin was prepared in the usual fashion using a vacuum mixer. Cement was applied to the cut surface of the proximal tibia as well as along the undersurface of a size 3 MBT tibial component. The tibial component was positioned and impacted into place. Excess cement was removed using freer elevators. Cement was then applied to the cut surface of the femur as well as along the posterior flanges of a size 3 posterior stabilized femoral component. Femoral component was positioned and impacted into place. Excess cement was removed using freer elevators. A 10 mm polyethylene trial was inserted and the knee was brought in full extension with steady axial compression applied. Finally, cement was applied to the backside of a 32 mm three peg oval dome patella and the patellar component was positioned and patellar clamp applied.   After adequate curing of cement, the tourniquet  was deflated after total tourniquet time of 103 minutes. Hemostasis was achieved using electrocautery. The knee was irrigated with copious amounts of normal saline with antibiotic solution using pulsatile lavage and then suctioned dry. The knee was inspected for any residual cement debris. 30 mL of 0.25% Marcaine with epinephrine was injected along the posterior capsule. A 10 mm stabilized rotating platform polyethylene insert was placed and the knee was placed through a range of motion. Excellent patellar tracking was appreciated and excellent mediolateral soft tissue balancing was appreciated both in full extension and in flexion. Two medium drains were placed in the wound bed and brought out through a separate stab incision to be attached to a reinfusion system. The medial parapatellar portion of the incision was reapproximated using interrupted sutures of #1 Vicryl. The subcutaneous tissue was approximated in layers using first #0 Vicryl followed by #2-0 Vicryl. Skin was closed with skin staples. A sterile dressing was applied.   The patient tolerated the procedure well. She was transported to the recovery room in stable condition.  ____________________________ Laurice Record. Holley Bouche., MD jph:sb D: 05/11/2012 15:39:12 ET T: 05/12/2012 08:46:11 ET JOB#: [id number]  cc: Jeneen Rinks P. Holley Bouche., MD, <Dictator> JAMES P Holley Bouche MD ELECTRONICALLY SIGNED 05/17/2012 20:35

## 2014-09-06 NOTE — Discharge Summary (Signed)
PATIENT NAME:  Samantha Mason, Samantha Mason MR#:  [id number] DATE OF BIRTH:  September 04, 1950  DATE OF ADMISSION:  05/11/2012.  DATE OF DISCHARGE:  05/14/2012.  ADMITTING DIAGNOSIS: Degenerative arthrosis of the left knee.   DISCHARGE DIAGNOSIS: Degenerative arthrosis of the left knee.    HISTORY OF PRESENT ILLNESS: The patient is a 64 year old female who has been followed at [facility] for progression of left knee discomfort. She had previously underwent 2 left knee arthroscopies for meniscectomies. She was noted at that time to have grade III chondromalacia involving the medial compartment and as well as the patellofemoral compartment. She was unable to tolerate anti-inflammatory medications secondary to her history of gastritis. The patient did not see any significant improvement in her symptoms despite the series of Synvisc injections, tramadol, as well as hydrocodone. At the time of surgery, she was using a cane for ambulation. She had attempted to continue to do her regular work duties but was having difficulty time due to the severity of knee pain. She denied any locking of the knee but occasionally it was noted to have some near giving way and occasional activity-related swelling. The pain had progressed to the point that it was significantly interfering with her activities of daily living. X-rays taken in [place] showed the medial cartilage space narrowing with associated varus alignment. She was noted to have osteophytes as well as subchondral sclerosis. After discussion of the risks and benefits of surgical intervention, the patient expressed her understanding of the risks and benefits and agreed for plans for surgical intervention.   HOSPITAL COURSE/PROCEDURE: Left total knee arthroplasty using computer-assisted navigation.   ANESTHESIA: Femoral nerve block with general.   SOFT TISSUE RELEASE: Anterior cruciate ligament, posterior cruciate ligament, deep and superficial medial  collateral ligaments, as well as the patellofemoral ligament.   IMPLANTS UTILIZED: DePuy PFC Sigma size 3 posterior stabilized femoral component (cemented), size 3 MBT tibial component (cemented), 32 mm 3-pegged double-dome patella (cemented), and a 10 mm stabilized rotating platform polyethylene insert.   The patient tolerated the procedure very well. She had no complications. She was then taken to the PACU where she was stabilized and then transferred to the Orthopaedic floor. She began receiving anticoagulation therapy of Lovenox 30 mg subcutaneous every 12 hours per Anesthesia and Pharmacy protocol. She was fitted with TED stockings bilaterally. These were allowed to be removed 1 hour per 8 hour shift. The left one was applied on day 2 following removal of the Hemovac and dressing change. The patient was also fitted with the AV-I compression foot pumps bilaterally set at 80 mmHg. There has been no evidence of any DVTs of the lower extremity. The calves have been nontender. Her heels were elevated off the bed using rolled towels.   The patient has denied any chest pain or shortness of breath. Vital signs have been stable. She has been afebrile. Hemodynamically she was stable and no transfusions were given postoperatively other than the AutoVac transfusion given the first 6 hours. The patient was noted to use her CPAP intermittently while in the hospital.   The patient began receiving physical therapy on day 1 for gait training and transfers. She has done very well. Upon being discharged, was able to ambulate greater than 200 feet. She was independent with bed to chair transfers. Was able to go up and down 4 sets of steps. Occupational therapy was also initiated on day 1 for ADL and assistive devices. There have been no complications.   The patient's  IV, Foley and Hemovac were discontinued on day 2 along with dressing change. The Polar Care was reapplied to the surgical leg maintaining a temperature of  40 to 50 degrees Fahrenheit. Overall this has been an unremarkable hospital course.   DISPOSITION: The patient is being discharged to home in improved stable condition.   DISCHARGE INSTRUCTIONS:  1. She may weight bear as tolerated.  2. Continue TED stockings. These are to be worn during the day but may be removed at night.  3. Continue to use a walker until cleared by Physical Therapy to go to a quad cane.  4. She will receive home health physical therapy for 2 weeks followed by outpatient therapy.  5. She has a follow-up appointment in the clinic in 2 weeks for evaluation and staple removal. In the meantime, she is to keep the wound clean and dry.  6. She is placed on a regular diet.  7. She is to call the clinic if she shows any temperatures of 101.5 or greater or excessive bleeding.  8. She is to continue the Polar Care maintaining a temperature of 40 to 50 degrees Fahrenheit. Recommend that she try to wear this as much as she can around-the-clock.  9. She is to resume her regular medication she was on prior to admission.  10. She was given 3 prescriptions on discharge. These included Lovenox 40 mg subcutaneously daily 14 days, and then discontinue and begin taking one 81 mg enteric-coated aspirin. Ultram 50 to 100 mg every 4 to 6 hours p.r.n. for pain, and Roxicodone 5 to 10 mg every 4 to 6 hours p.r.n. for pain.   PAST MEDICAL HISTORY:  1. Asthma.  2. Seasonal allergies. 3. IBS.  4. Shingles.  5. Stroke in 1995.  6. Sleep apnea. 7. Hospitalization of gastritis in April 2012.   ____________________________ Vance Peper, PA jrw:jm D: 05/15/2012 07:44:32 ET T: 05/15/2012 12:59:07 ET JOB#: [id number]  cc: Vance Peper, PA, <Dictator> JON WOLFE PA ELECTRONICALLY SIGNED 05/18/2012 18:21

## 2014-09-10 NOTE — Discharge Summary (Signed)
PATIENT NAME:  Samantha Mason, WAHLEN MR#:  [id number] DATE OF BIRTH:  Mar 25, 1951  DATE OF ADMISSION:  09/22/2013 DATE OF DISCHARGE:  09/26/2013  PRIMARY CARE PHYSICIAN: Dr. Hardin Negus.   FINAL DIAGNOSES:  1. Acute respiratory failure which resolved.  2. Asthma exacerbation.  3. Hypertension.  4. Hyponatremia.  5. Gastroesophageal reflux disease.   MEDICATIONS ON DISCHARGE: Include Protonix 40 mg daily, Lexapro 10 mg daily, Requip 2 mg at bedtime, Zyrtec 10 mg daily, promethazine 25 mg every 6 hours as needed, Lomotil 0.025 mg/2.5 mg 2 tablets 4 times a day as needed, Librax 5 mg/2.5 mg 1 capsule 4 times a day before meals and at bedtime as needed, Symbicort 160/4.5 two puffs twice a day, prednisone 10 mg 4 tablets day one, 3 tablets day two, 2 tablets day three, 1 tablet days four and five, 1/2 tablet days six and seven, Combivent Respimat 1 puff 4 times a day as needed for shortness of breath.   DIET: Low-sodium diet, regular consistency.   ACTIVITY: As tolerated.   FOLLOWUP: Keep appointment with Dr. Hardin Negus.   HOSPITAL COURSE: The patient was admitted 09/22/2013 and discharged 09/26/2013. The patient was admitted with shortness of breath, asthma exacerbation, acute respiratory failure. Started on nebulizers, Solu-Medrol and Zithromax. Blood pressure was elevated when she came in, hypertensive urgency.   LABORATORY AND RADIOLOGICAL DATA DURING THE HOSPITAL COURSE: Included a troponin that was negative. White blood cell count 9.9, H and H 11.3 and 35.5, platelet count of 180. Glucose 114, BUN 14, creatinine 1.26, sodium 132, potassium 4.4, chloride 100, CO2 25. EKG: Sinus tachycardia. Chest x-ray showed bilateral diffuse interstitial thickening and peribronchial cuffing, most concerning for bronchitis. Creatinine upon discharge 1.24 with a GFR of 47, white count 12.9, hemoglobin 10.3.   HOSPITAL COURSE PER PROBLEM LIST:  1. For the patient's acute respiratory failure, the patient was requiring  oxygen on presentation. The patient was tapered off to room air upon discharge. This had resolved.  2. Asthma exacerbation: Advised Symbicort is a twice a day medication. She was given IV Solu-Medrol during the hospital course and a prednisone taper upon discharge. She finished a whole course of Zithromax while here in the hospital on discharge there was slight expiratory wheeze at bilateral bases. This should continue to improve over time.  3. Hypertension: Blood pressure was elevated on presentation but then came down to 127/66 once the patient was breathing better. Likely, the accelerated hypertension was secondary to the respiratory failure.  4. Hyponatremia: Sodium just 1 point less than the normal range. Not much emphasis put on this.  5. Gastroesophageal reflux disease, on Protonix.  6. Chronic kidney disease stage III: I think this is her baseline. Can check what Dr. Hardin Negus' records as outpatient.   TIME SPENT ON DISCHARGE: 35 minutes.   ____________________________ Tana Conch. Leslye Peer, MD rjw:gb D: 09/26/2013 14:32:05 ET T: 09/27/2013 02:32:35 ET JOB#: [id number]  cc: Tana Conch. Leslye Peer, MD, <Dictator> Morton Peters., MD Marisue Brooklyn MD ELECTRONICALLY SIGNED 10/06/2013 14:02

## 2014-09-10 NOTE — H&P (Signed)
PATIENT NAME:  Samantha Mason, ORBAN MR#:  [id number] DATE OF BIRTH:  30-Sep-1950  DATE OF ADMISSION:  09/22/2013  REFERRING PHYSICIAN:  Dr. Robet Leu.   PRIMARY CARE PHYSICIAN:  Dr. Rutherford Nail.   CHIEF COMPLAINT:  Short of breath.  HISTORY OF PRESENT ILLNESS:  A 64 year old Caucasian female with a history of asthma, obstructive sleep apnea on CPAP therapy, hypertension, presenting with shortness of breath.  She describes a four day duration of shortness of breath with progressively worsening associated URI-like symptoms including nasal congestion and discharge with associated cough of yellowish sputum as well as subjective chills.  Denies any fevers.  Her PCP prescribed her a five day course of Levaquin which she has completed; however, no change of her symptoms, noted symptoms progressively worsened today thus presented to the Emergency Department.  Currently complaining of shortness of breath.   REVIEW OF SYSTEMS:  CONSTITUTIONAL:  Positive for chills, fatigue, weakness.  Denies fevers.  EYES:  Denied blurred vision, double vision, eye pain. EARS, NOSE, THROAT:  Denies tinnitus, ear pain, hearing loss.  RESPIRATORY:  Positive for cough and shortness of breath described above.  CARDIOVASCULAR:  Denies chest pain, palpitations. GASTROINTESTINAL  Denies nausea, vomiting, diarrhea, abdominal pain.  GENITOURINARY:  Denies dysuria, hematuria.  ENDOCRINE:  Denies nocturia or thyroid problems.  HEMATOLOGY AND LYMPHATIC:  Denies easy bruising, bleeding.   SKIN:  Denies rash or lesion.   MUSCULOSKELETAL:  Denies pain in the neck, back, shoulder, knees, hip or arthritic symptoms.  NEUROLOGIC:  Denies paralysis, paresthesias.  PSYCHIATRIC:  Denies anxiety or depressive symptoms.  Otherwise, a full review of systems performed by me is negative.   PAST MEDICAL HISTORY:  Anemia, obstructive sleep apnea on CPAP therapy, hypertension, asthma usually well controlled, has never been intubated, usually does not  require rescue nebulizers as well as GERD.   SOCIAL HISTORY:  Denies any tobacco, alcohol or drug usage.   FAMILY HISTORY:  Positive for hypertension.   ALLERGIES:  PENICILLIN AND STERAPRED.   HOME MEDICATIONS:  Include Lexapro 10 mg by mouth daily, Lomotil 0.025 mg/2.5 mg 2 tabs 4 times daily as needed, promethazine 25 mg by mouth q. 6 hours as needed, Zyrtec 10 mg by mouth daily, Requip 2 mg by mouth at bedtime, Symbicort 160/4.5 mcg inhalation 2 puffs twice daily, Librax 5/2.5 mg 4 times daily before meals and bedtime as needed, Protonix 40 mg by mouth daily.   PHYSICAL EXAMINATION: VITAL SIGNS:  Temperature 100.8 degrees Fahrenheit, heart rate 131, respirations 38, blood pressure 198/83, saturating 90% on room air, saturating 100% on 2 liters nasal cannula.  Weight 137 kg, BUN 51.9.  GENERAL:  Obese Caucasian female currently in minimal distress secondary to respiratory status.  HEAD:  Normocephalic, atraumatic.  EYES:  Pupils equal, round, reactive to light.  Extraocular muscles intact.  No scleral icterus.   MOUTH:  Moist mucous membranes.  Dentition intact.  No abscess noted.  EARS, NOSE AND THROAT:  Clear without exudates.  No external lesions.  NECK:  Supple.  No thyromegaly.  No nodules.  No JVD.  PULMONARY:  Scant wheezing with prolonged expiratory phase noted, most prominently in right upper lobe as well as tachypnea.  No use of accessory muscles.  Good respiratory effort.  CHEST:  Nontender to palpation.  CARDIOVASCULAR:  S1, S2, tachycardic.  No murmurs, rubs or gallops.  No edema.  Pedal pulses 2+ bilaterally.  GASTROINTESTINAL:  Soft, nontender, nondistended.  No masses.  Positive bowel sounds.  No hepatosplenomegaly.  MUSCULOSKELETAL:  No swelling, clubbing, edema.  Range of motion full in all extremities.  NEUROLOGIC:  Cranial nerves II through XII intact.  No gross focal neurological deficits.  Sensation intact.  Reflexes intact.  SKIN:  No ulceration, lesions, rash or  cyanosis.  Skin warm and dry.  Turgor intact.  PSYCHIATRIC:  Mood and affect within normal limits.  The patient is alert and oriented x 3.  Insight and judgment intact.   LABORATORY DATA:  Chest x-ray performed revealing bilateral diffuse interstitial thickening and peribronchial cuffing concerning for bronchitis.  No evidence of pneumonia.  Sodium 132, potassium 4.4, chloride 100, bicarb 25, BUN 14, creatinine 1.26, glucose 114.  WBC 9.9, hemoglobin 11.3, platelets 180.   ASSESSMENT AND PLAN:  A 64 year old Caucasian female with history of asthma, obstructive sleep apnea, presenting with shortness of breath.  1.  Asthma exacerbation secondary to allergy/upper respiratory infection.  Provide azithromycin as well as supplemental O2 to keep SaO2 greater than 92%.  DuoNeb therapy q. 4 hours.  Steroids as well as continue home dosage of Symbicort.  2.  Hypertensive urgency:  We will resume home medications.  Add as needed hydralazine for systolic blood pressure greater than 99991111 or diastolic greater than [id number].  3.  Hyponatremia:  Intravenous fluid hydration with normal saline.  4.  Gastroesophageal reflux disease:  Proton pump inhibitor therapy.  5.  Venous thromboembolism prophylaxis with heparin subQ.  6.  CODE STATUS:  THE PATIENT IS A FULL CODE.   TIME SPENT:  45 minutes.    ____________________________ Aaron Mose. Hower, MD dkh:ea D: 09/22/2013 23:12:37 ET T: 09/22/2013 23:53:55 ET JOB#: [id number]  cc: Aaron Mose. Hower, MD, <Dictator> DAVID Woodfin Ganja MD ELECTRONICALLY SIGNED 09/24/2013 1:00

## 2014-09-10 NOTE — H&P (Signed)
PATIENT NAME:  Samantha Mason, Samantha Mason MR#:  [id number] DATE OF BIRTH:  12/06/1950  DATE OF ADMISSION:  03/22/2014  REFERRING PHYSICIAN:  Gretchen Short. Beather Arbour, MD  PRIMARY CARE PHYSICIAN:  Ashok Norris, MD   ADMITTING DOCTOR:  Juluis Mire, MD   CHIEF COMPLAINT:   1.  Cough with expectoration associated with shortness of breath and wheezing for the past 4 days.  2.  Fever of 1-day duration.    HISTORY OF PRESENT ILLNESS:  This is a 64 year old Caucasian female with a past medical history of bronchial asthma, history of obstructive sleep apnea on CPAP, irritable bowel syndrome, chronic anemia, restless leg syndrome, generalized anxiety disorder/depression, history of CVA with complete recovery, who was brought to the Emergency Room with the complaints of ongoing upper respiratory symptoms with cough with expectoration of yellow sputum for the past 3 to 4 days, associated with shortness of breath and wheezing. The patient was also noted to have a fever of 1-day duration. The symptoms continued to increase; hence, EMS was called, who gave the patient 2 rounds of DuoNebs and 1 round of albuterol and also IV Solu-Medrol 125 mg, following which the patient was brought to the Emergency Room for further evaluation. In the Emergency Room, the patient was evaluated by the ED physician and workup revealed an elevated white blood cell count of 14.2, chest x-ray was unremarkable, and she was diagnosed to have acute exacerbation of bronchial asthma secondary due to acute bronchitis versus pneumonia. Hence, the hospitalist service was consulted for further management and advice. The patient started feeling better after she received DuoNebs in the Emergency Room but states she is still having significant wheezing with shortness of breath, and oxygen saturation are maintaining around 90 to 94%.   PAST MEDICAL HISTORY:  1.  Bronchial asthma, history of obstructive sleep apnea on CPAP.  2.  Irritable bowel syndrome.  3.   Restless leg syndrome.  4.  Chronic anemia.  5.  Generalized anxiety disorder/depression.  6.  History of CVA with complete recovery.   PAST SURGICAL HISTORY: 1.  Status post left total knee replacement.  2.  Bilateral tubal ligation.  3.  Cholecystectomy.   ALLERGIES:   1.  PENICILLIN, WHICH CAUSES HER GASTROINTESTINAL UPSET.  2.  STERAPRED CAUSES HER DIFFICULTY IN SLEEP.   The patient received IV Solu-Medrol through EMS and tolerated it well, and also the patient stated that she received Solu-Medrol in the past without any problems.   HOME MEDICATIONS:  1.  Combivent 100/20 mcg inhalation 4 times a day as needed.  2.  Lexapro 10 mg p.o. 1 tablet a day.  3.  Lomotil 0.25 mg/2.5 mg orally 2 tablets 4 times a day as needed.  4.  Protonix 40 mg 1 tablet orally once a day.  5.  Questran 4 grams/5 grams portable reconstitution one packet orally once a day at bedtime.  6.  Requip 2 mg tablet 1 tablet orally at bedtime.  7.  Symbicort 160/4.5 mcg inhalation 2 puff inhalations 2 times a day.  8.  Zyrtec 10 mg tablet 1 tablet orally once a day.   FAMILY HISTORY:  Significant for hypertension.   SOCIAL HISTORY:  She is widowed. She works as a Software engineer at [employer]. Denies any history of smoking, alcohol, or substance abuse.   REVIEW OF SYSTEMS:  CONSTITUTIONAL:  Positive for fever, fatigue, and weakness of 1-day duration.   EYES:  Negative for blurred vision or double vision. No pain.  No redness. No inflammation.  EARS, NOSE, AND THROAT:  Negative for tinnitus, ear pain, hearing loss, or epistaxis. She did have some sinus congestion, which started about 3 to 4 days ago, which progressed into cough with expectoration.  RESPIRATORY:  Cough with expectoration of yellow sputum for the past 3 to 4 days, associated with shortness of breath with wheezing for the past 3 to 4 days. No hemoptysis. No painful respirations. She does have a history of asthma, for which she takes  her chronic medications.  CARDIOVASCULAR:  Negative for chest pain, palpitations, syncope, or dizziness. No orthopnea. No pedal edema.  GASTROINTESTINAL:  Negative for nausea, vomiting, diarrhea, abdominal pain, hematemesis, melena, or GERD symptoms.  GENITOURINARY:  Negative for dysuria, hematuria, frequency, or urgency.  ENDOCRINE:  Negative for polyuria or polydipsia. No heat or cold intolerance.  HEMATOLOGIC AND LYMPHATIC:  History of chronic anemia present. Negative for easy bruising or bleeding.   INTEGUMENTARY:  Negative for acne, skin rash, or lesions.  MUSCULOSKELETAL:  Negative for arthritis, back pain, or gout.  NEUROLOGICAL:  Negative for focal weakness or numbness. History of CVA with complete recovery in the past. Negative for any TIA or seizures.   PSYCHIATRIC:  Positive for anxiety/depression, for which she takes Lexapro and is under control.   PHYSICAL EXAMINATION: VITAL SIGNS:  On arrival to the Emergency Room, temperature 101.2 degrees Fahrenheit, pulse rate 135 per minute, respirations 33 per minute, blood pressure 184/69, oxygen saturation 94% on oxygen supplementation of 2 liters. Current vital signs:  Temperature 98.2, pulse rate 170, respirations 20 per minute, blood pressure 167/62, oxygen saturation 93% on oxygen 2 liters.  GENERAL:  Well-developed, well-nourished, obese lady, alert and oriented, not in acute distress.  HEAD:  Atraumatic, normocephalic.  EYES:  Pupils are equal and reactive to light. No conjunctival erythema. No scleral icterus. Extraocular movements are intact.  NOSE:  No nasal lesions. No drainage.  EARS:  No drainage. No external lesions.  ORAL CAVITY:  No mucosal lesions. No exudates. No thrush. No masses.  NECK:  Supple. No JVD. No thyromegaly. No carotid bruit. Range of motion is normal.  RESPIRATORY:  Bilateral air entry present. Not using accessory muscles of respiration. Bilateral rhonchi present. No rales.  CARDIOVASCULAR:  S1, S2 regular.  Tachycardia present. No murmurs, gallops, or clicks appreciated. Peripheral pulses are equal at carotid, femoral, and pedal pulses. No pedal edema.  GASTROINTESTINAL:  Abdomen is soft, obese, and nontender. No hepatosplenomegaly. Bowel sounds present and equal in all 4 quadrants. No rigidity. No guarding.  GENITOURINARY:  Deferred.  MUSCULOSKELETAL:  Normal range of motion. Strength and tone are equal bilaterally in all 4 limbs. No tenderness. No effusion.  SKIN:  Inspection within normal limits.  LYMPH NODES:  No lymphadenopathy.  VASCULAR:  Good dorsalis pedis and posterior tibial pulses.  NEUROLOGICAL:  Alert, awake, and oriented x 3. Cranial nerves II through XII are grossly intact. Deep tendon reflexes are 2+ bilaterally and symmetrical. Motor strength is 5/5 in both upper and lower extremities.  PSYCHIATRIC:  Judgment and insight are adequate. Alert and oriented x 3. Memory and mood are within normal limits.   LABORATORY DATA:  Serum glucose is 184, BUN 10, creatinine 1.23, sodium 142, potassium 2.9, chloride 104, bicarbonate 25, and total calcium is 7.9. Troponin is less than 0.02. WBC is 14.2, hemoglobin 10.6, hematocrit 34.6, platelet count 240, and MCV is 74.   IMAGING STUDIES:  Chest x-ray:  Heart size and mediastinal contour within normal limits. Both  lungs are clear. No active disease.   EKG:  Sinus tachycardia with ventricular rate of 130 beats per minute. Nonspecific ST-T changes.    ASSESSMENT AND PLAN:  This is a 64 year old Caucasian female with a past medical history significant for bronchial asthma, history of obstructive sleep apnea on continuous positive airway pressure, irritable bowel syndrome, restless leg syndrome, chronic anemia, generalized anxiety disorder/depression, and history of cerebrovascular accident with complete recovery, who presents with the complaints of cough with expectoration of yellow sputum, associated with shortness of breath and wheezing ongoing for the  past 3 to 4 days and fever with chills of 1-day duration.   1.  Acute exacerbation of bronchial asthma secondary due to acute bronchitis, rule out pneumonia. Plan:  Admit to med-surge under telemetry. Oxygen supplementation, sputum cultures, blood cultures, vigorous nebulizers, IV Solu-Medrol, and IV Levaquin.  2.  Hypokalemia, unclear etiology. Plan:  Potassium supplementation. Follow BMP.  3.  Obstructive sleep apnea on continuous positive airway pressure. Continue same.  4.  History of irritable bowel syndrome, stable on home medications. Continue same.  5.  History of chronic anemia, stable. Monitor.  6.  History of restless leg syndrome, stable on home medications. Continue same.  7.  History of generalized anxiety disorder/depression, stable on Lexapro. Continue same.  8.  History of cerebrovascular accident with complete recovery. No symptoms. Monitor.  9.  Deep vein thrombosis prophylaxis with subcutaneous Lovenox.   CODE STATUS:  Full code.   TIME SPENT:  55 minutes.    ____________________________ Juluis Mire, MD enr:nb D: 03/22/2014 05:21:38 ET T: 03/22/2014 05:50:28 ET JOB#: [id number]  cc: Juluis Mire, MD, <Dictator> Ashok Norris, MD  Juluis Mire MD ELECTRONICALLY SIGNED 03/22/2014 18:29

## 2014-09-10 NOTE — Discharge Summary (Signed)
PATIENT NAME:  Samantha Mason, Samantha Mason MR#:  [id number] DATE OF BIRTH:  Nov 29, 1950  DATE OF ADMISSION:  03/22/2014 DATE OF DISCHARGE:  03/25/2014  ADMITTING DIAGNOSIS: Chronic obstructive pulmonary disease exacerbation.   DISCHARGE DIAGNOSES:  1.  Systemic inflammatory response syndrome due to asthma exacerbation.  2.  Acute bronchitis.  3.  History of obstructive sleep apnea on continuous positive airway pressure at night.  4.  History of irritable bowel syndrome.  5.  Restless leg syndrome.  6.  Generalized anxiety/depression.  7.  Stroke with complete recovery.  8.  Obesity. 9.  Malignant hypertension during this admission. 10.  Questionable fluid overload.   DISCHARGE CONDITION: Stable.   DISCHARGE MEDICATIONS: The patient is to continue  Protonix 40 mg p.o. daily, Lexapro 10 mg daily. Requip 2 mg at bedtime, Zyrtec 10 mg daily, Lomotil 0.025 mg/2.5 mg 2 tablets 4 times daily as needed, Questran 1 packet once daily at bedtime, Combivent respimat CFC free 1 puff 4 times daily as needed, DuoNeb 1 inhalation up to 6 times daily as needed with nebulizing machine with prescription for which he is given upon discharge, Symbicort 160/4.5 at 2 puffs twice daily, prednisone taper at 60 mg p.o. once on March 26, 2014, then taper by 10 mg every 2 days until stopped, lisinopril 10 mg p.o. twice daily, tiotropium 1 inhalation once daily, guaifenesin 300 mg twice daily, Levaquin 500 mg p.o. every 24 hours for 7 more days, Tussionex 5 mL twice daily.   HOME OXYGEN: None.   DIET: 2 gram salt, low-fat, low-cholesterol, low-calorie diet. The patient was advised to lose weight, if possible. Diet consistency: Regular.   ACTIVITY LIMITATIONS: As tolerated.   REFERRAL: To pulmonary rehabilitation.   FOLLOWUP APPOINTMENT: With Dr. Rutherford Nail in 2 days after discharge, Allyne Gee, MD, in 1 week after discharge. Also, [facility] in 2 or 3 days for outpatient stress test.   CONSULTANTS: Care management,  social work.   RADIOLOGIC STUDIES: Chest x-ray, portable, single view, done on of the 3rd of November revealing no active disease. Repeated chest x-ray, portable, single view of March 23, 2014, showed mild vascular congestion without edema. Echocardiogram on November 6, /2015. The results are still pending at the time of dictation.   HOSPITAL COURSE: The patient is a 64 year old Caucasian female with past medical history significant for history of asthma who presents to the hospital with complaints of cough, expectoration, shortness of breath, as well as wheezing and fever. Please refer to Dr. Ephriam Jenkins admission note on March 22, 2014. On arrival to the hospital to the Emergency Room, the patient denied any smoking. Her physical examination revealed unremarkable cardiovascular examination. Respiratory; bilateral air entry was present and now using accessory muscles of respiration, but bilateral rhonchi were also present, but no rales. The patient's lab data done on arrival to the hospital showed elevated glucose level of 184, potassium of 2.9. Otherwise BMP was unremarkable. The patient's cardiac enzymes; troponin was less than 0.02. White blood cell count was elevated to 14.2, hemoglobin was 10.6, platelet count was 240,000. MCV was low at 74.  Absolute neutrophil count was 12.3. Blood cultures taken on the day of admission, March 22, 2014, showed no growth. Influenza, tuberculosis testing were negative. EKG showed sinus tachycardia at 130 beats per minute ST-T wave abnormality, consider inferior ischemia according to EKG criteria.   The patient was admitted to the hospital for further evaluation. She was initiated on antibiotics, as well as inhalers, inhalation therapy, nebulizers, and steroids.  With this, she improved significantly. Her blood pressure; however, was noted to be markedly elevated ranging from [id number] to [id number] systolic. She was initiated also on lisinopril for blood pressure management, which  was advanced to current levels. With this dual therapy, her condition significantly improved. It was felt that the patient's shortness of breath, cough was most likely bronchitis, but because she did have mild vascular congestion on her repeated chest x-ray and elevated blood pressure, we were also concerned about possible diastolic congestive heart failure. For this reason, we got an echocardiogram the results of which are still pending at the time of dictation. We recommend patient to follow up with cardiologist in the next few days after discharge to get an outpatient stress test done. We also recommend to follow her blood pressure readings very closely and advance her blood pressure medications if needed. In regard to her chronic medical problems such as obstructive sleep apnea, irritable bowel syndrome, restless leg syndrome, generalized anxiety/depression, the patient is to continue her outpatient management. No changes were made. The patient is being discharged in stable condition with the above-mentioned medications and followup. On the day of discharge, temperature was 98.5, pulse was 86, respiration rate was 20, blood pressure 165/71. Saturation was 91% to 93% on room air at rest.   TIME SPENT: 40 minutes.   ____________________________ Theodoro Grist, MD rv:am D: 03/25/2014 18:18:38 ET T: 03/26/2014 05:14:39 ET JOB#: [id number]  cc: Theodoro Grist, MD, <Dictator> Ashok Norris, MD Allyne Gee, MD [facility]   [place] MD ELECTRONICALLY SIGNED 04/17/2014 20:06

## 2014-11-07 ENCOUNTER — Other Ambulatory Visit: Payer: Self-pay | Admitting: Family Medicine

## 2014-12-07 ENCOUNTER — Ambulatory Visit (INDEPENDENT_AMBULATORY_CARE_PROVIDER_SITE_OTHER): Payer: 59 | Admitting: Family Medicine

## 2014-12-07 ENCOUNTER — Encounter: Payer: Self-pay | Admitting: Family Medicine

## 2014-12-07 VITALS — BP 142/72 | HR 95 | Temp 98.7°F | Resp 18 | Ht 64.0 in | Wt 295.1 lb

## 2014-12-07 DIAGNOSIS — N183 Chronic kidney disease, stage 3 unspecified: Secondary | ICD-10-CM

## 2014-12-07 DIAGNOSIS — Z9989 Dependence on other enabling machines and devices: Secondary | ICD-10-CM

## 2014-12-07 DIAGNOSIS — E039 Hypothyroidism, unspecified: Secondary | ICD-10-CM | POA: Diagnosis not present

## 2014-12-07 DIAGNOSIS — I5020 Unspecified systolic (congestive) heart failure: Secondary | ICD-10-CM

## 2014-12-07 DIAGNOSIS — E669 Obesity, unspecified: Secondary | ICD-10-CM

## 2014-12-07 DIAGNOSIS — G4733 Obstructive sleep apnea (adult) (pediatric): Secondary | ICD-10-CM | POA: Diagnosis not present

## 2014-12-07 MED ORDER — ROPINIROLE HCL 2 MG PO TABS: 2.0000 mg | ORAL_TABLET | Freq: Every day | ORAL | Status: DC

## 2014-12-07 MED ORDER — DIPHENOXYLATE-ATROPINE 2.5-0.025 MG PO TABS: 1.0000 | ORAL_TABLET | Freq: Three times a day (TID) | ORAL | Status: DC

## 2014-12-07 MED ORDER — ARMODAFINIL 200 MG PO TABS: 200.0000 mg | ORAL_TABLET | Freq: Every day | ORAL | Status: DC

## 2014-12-07 MED ORDER — CETIRIZINE HCL 10 MG PO TABS: 10.0000 mg | ORAL_TABLET | Freq: Every day | ORAL | Status: DC

## 2014-12-07 MED ORDER — PANTOPRAZOLE SODIUM 40 MG PO TBEC: 40.0000 mg | DELAYED_RELEASE_TABLET | Freq: Every day | ORAL | Status: DC

## 2014-12-07 MED ORDER — CHOLESTYRAMINE 4 GM/DOSE PO POWD: 4.0000 g | Freq: Every day | ORAL | Status: DC

## 2014-12-07 MED ORDER — ESCITALOPRAM OXALATE 10 MG PO TABS: 20.0000 mg | ORAL_TABLET | Freq: Every day | ORAL | Status: DC

## 2014-12-07 NOTE — Patient Instructions (Signed)

## 2014-12-07 NOTE — Progress Notes (Signed)
Name: Samantha Mason   MRN: [id number]    DOB: [birth year]   Date:12/07/2014       Progress Note  Subjective  Chief Complaint  Chief Complaint  Patient presents with  . Chronic Kidney Disease  . Obesity  . Asthma  . Congestive Heart Failure  . Hypertension    Asthma She complains of chest tightness, cough, difficulty breathing, shortness of breath and wheezing. There is no hemoptysis. This is a recurrent problem. The current episode started more than 1 year ago. The problem occurs intermittently. The problem has been unchanged. The cough is non-productive. Associated symptoms include nasal congestion. Pertinent negatives include no chest pain, fever, headaches, heartburn, myalgias, sore throat or weight loss. Her symptoms are alleviated by beta-agonist and steroid inhaler. Her past medical history is significant for asthma.  Congestive Heart Failure Presents for follow-up visit. Associated symptoms include edema and shortness of breath. Pertinent negatives include no chest pain, claudication or palpitations. The symptoms have been resolved. Past treatments include salt and fluid restriction and exercise. Compliance with prior treatments has been good. Compliance problems include adherence to exercise and adherence to diet.  Compliance with diet is 0-25%. Compliance with exercise is 0-25%. Compliance with medications is 76-100%.  Hypertension This is a chronic problem. The current episode started more than 1 year ago. The problem is unchanged. The problem is controlled. Associated symptoms include shortness of breath. Pertinent negatives include no blurred vision, chest pain, headaches, neck pain, orthopnea or palpitations. Risk factors for coronary artery disease include sedentary lifestyle, stress, obesity and post-menopausal state. Past treatments include diuretics. The current treatment provides mild improvement. There are no compliance problems.  Hypertensive end-organ damage includes a  thyroid problem. Identifiable causes of hypertension include chronic renal disease.  Thyroid Problem Presents for follow-up visit. Symptoms include depressed mood and weight gain. Patient reports no anxiety, constipation, diarrhea, palpitations, tremors or weight loss. The symptoms have been stable. Past treatments include levothyroxine.    Chronic kidney disease  Patient was noted during hospitalization to have C daily. She has was given an appointment to nephrologist but has not kept it. Chronic creatinine clearance was estimated to be 40. Currently she has some peripheral edema and is on Lasix. His note that she also has a history of congestive heart failure as noted above and acute hospitalization with exacerbation of her bronchitis. Workup by cardiologist was negative for any ongoing ischemic cardiac problems.  Obstructive sleep apnea  Patient has a long-standing history of obstructive sleep apnea and some CPAP. She still remains quite somnolent during the day and therefore is on Nuvigil.  Past Medical History  Diagnosis Date  . Acute bronchitis   . OSA (obstructive sleep apnea)   . Irritable bowel syndrome   . Restless leg syndrome   . Anxiety and depression   . Stroke   . Obesity   . Malignant hypertension   . Asthma     History  Substance Use Topics  . Smoking status: Never Smoker   . Smokeless tobacco: Never Used  . Alcohol Use: No     Current outpatient prescriptions:  .  budesonide-formoterol (SYMBICORT) 160-4.5 MCG/ACT inhaler, Inhale 2 puffs into the lungs 2 (two) times daily as needed., Disp: , Rfl:  .  cetirizine (ZYRTEC) 10 MG tablet, Take 10 mg by mouth daily., Disp: , Rfl:  .  cholestyramine (QUESTRAN) 4 GM/DOSE powder, daily., Disp: , Rfl: 5 .  diphenoxylate-atropine (LOMOTIL) 2.5-0.025 MG per tablet, TAKE 1 TABLET BY MOUTH  3 TIMES DAILY, Disp: 90 tablet, Rfl: 3 .  escitalopram (LEXAPRO) 10 MG tablet, Take 20 mg by mouth daily. , Disp: , Rfl:  .  NUVIGIL 200  MG TABS, Take 200 mg by mouth daily., Disp: , Rfl: 2 .  pantoprazole (PROTONIX) 40 MG tablet, Take 40 mg by mouth daily., Disp: , Rfl:  .  rOPINIRole (REQUIP) 2 MG tablet, Take 2 mg by mouth at bedtime., Disp: , Rfl:   Allergies  Allergen Reactions  . Penicillins     Review of Systems  Constitutional: Positive for weight gain. Negative for fever, chills and weight loss.  HENT: Negative for congestion, hearing loss, sore throat and tinnitus.   Eyes: Negative for blurred vision, double vision and redness.  Respiratory: Positive for cough, shortness of breath and wheezing. Negative for hemoptysis.   Cardiovascular: Positive for leg swelling. Negative for chest pain, palpitations, orthopnea and claudication.  Gastrointestinal: Negative for heartburn, nausea, vomiting, diarrhea, constipation and blood in stool.  Genitourinary: Negative for dysuria, urgency, frequency and hematuria.  Musculoskeletal: Negative for myalgias, back pain, joint pain, falls and neck pain.  Skin: Negative for itching.  Neurological: Positive for weakness. Negative for dizziness, tingling, tremors, focal weakness, seizures, loss of consciousness and headaches.  Endo/Heme/Allergies: Does not bruise/bleed easily.  Psychiatric/Behavioral: Negative for depression and substance abuse. The patient is not nervous/anxious and does not have insomnia.      Objective  Filed Vitals:   12/07/14 1209  BP: 142/72  Pulse: 95  Temp: 98.7 F (37.1 C)  Resp: 18  Height: 5\' 4"  (1.626 m)  Weight: 295 lb 2 oz (133.868 kg)  SpO2: 97%     Physical Exam  Constitutional: She is oriented to person, place, and time and well-developed, well-nourished, and in no distress.  Morbidly obese  HENT:  Head: Normocephalic.  Eyes: EOM are normal. Pupils are equal, round, and reactive to light.  Neck: Normal range of motion. No thyromegaly present.  Cardiovascular: Normal rate, regular rhythm and normal heart sounds.   No murmur  heard. Pulmonary/Chest: Effort normal and breath sounds normal.  Abdominal: Soft. Bowel sounds are normal.  Musculoskeletal: Normal range of motion. She exhibits edema.  Neurological: She is alert and oriented to person, place, and time. No cranial nerve deficit. Gait normal.  Skin: Skin is warm and dry. No rash noted.  Psychiatric: Mood, memory, affect and judgment normal.      Assessment & Plan  1. Systolic congestive heart failure, unspecified congestive heart failure chronicity Stable stable - Comprehensive metabolic panel - TSH  2. Hypothyroidism, unspecified hypothyroidism type Labs today  - TSH  3. Obesity Diet sheet - Comprehensive metabolic panel - TSH  4. OSA on CPAP Continue no Nuvigil - Armodafinil (NUVIGIL) 200 MG TABS; Take 200 mg by mouth daily.  Dispense: 30 tablet; Refill: 5  5. CKD (chronic kidney disease) stage 3, GFR 30-59 ml/min Referred to nephrologist

## 2014-12-08 ENCOUNTER — Encounter: Payer: Self-pay | Admitting: Family Medicine

## 2014-12-08 DIAGNOSIS — E669 Obesity, unspecified: Secondary | ICD-10-CM | POA: Insufficient documentation

## 2014-12-08 DIAGNOSIS — N183 Chronic kidney disease, stage 3 unspecified: Secondary | ICD-10-CM | POA: Insufficient documentation

## 2014-12-08 DIAGNOSIS — Z9989 Dependence on other enabling machines and devices: Secondary | ICD-10-CM | POA: Insufficient documentation

## 2014-12-08 DIAGNOSIS — E039 Hypothyroidism, unspecified: Secondary | ICD-10-CM | POA: Insufficient documentation

## 2014-12-08 DIAGNOSIS — I509 Heart failure, unspecified: Secondary | ICD-10-CM | POA: Insufficient documentation

## 2014-12-08 DIAGNOSIS — G4733 Obstructive sleep apnea (adult) (pediatric): Secondary | ICD-10-CM | POA: Insufficient documentation

## 2015-03-08 ENCOUNTER — Encounter: Payer: 59 | Admitting: Family Medicine

## 2015-05-21 DIAGNOSIS — J189 Pneumonia, unspecified organism: Secondary | ICD-10-CM

## 2015-05-21 HISTORY — DX: Pneumonia, unspecified organism: J18.9

## 2015-06-12 ENCOUNTER — Ambulatory Visit: Payer: 59 | Admitting: Family Medicine

## 2016-10-16 ENCOUNTER — Ambulatory Visit (INDEPENDENT_AMBULATORY_CARE_PROVIDER_SITE_OTHER): Payer: 59 | Admitting: Family Medicine

## 2016-10-16 ENCOUNTER — Encounter: Payer: Self-pay | Admitting: Family Medicine

## 2016-10-16 DIAGNOSIS — I10 Essential (primary) hypertension: Secondary | ICD-10-CM | POA: Diagnosis not present

## 2016-10-16 DIAGNOSIS — R197 Diarrhea, unspecified: Secondary | ICD-10-CM

## 2016-10-16 DIAGNOSIS — K219 Gastro-esophageal reflux disease without esophagitis: Secondary | ICD-10-CM

## 2016-10-16 MED ORDER — PANTOPRAZOLE SODIUM 40 MG PO TBEC: 40.0000 mg | DELAYED_RELEASE_TABLET | Freq: Every day | ORAL | 0 refills | Status: DC

## 2016-10-16 MED ORDER — DIPHENOXYLATE-ATROPINE 2.5-0.025 MG PO TABS: 1.0000 | ORAL_TABLET | Freq: Three times a day (TID) | ORAL | 0 refills | Status: DC | PRN

## 2016-10-16 NOTE — Progress Notes (Signed)
Name: Samantha Mason   MRN: [id number]    DOB: 29-Apr-1951   Date:10/16/2016       Progress Note  Subjective  Chief Complaint  Chief Complaint  Patient presents with  . GI Problem  . Depression    GI Problem  The primary symptoms include abdominal pain, nausea, diarrhea, myalgias and arthralgias. Primary symptoms do not include fever, weight loss or vomiting. The illness began more than 7 days ago. The onset was gradual.  The abdominal pain began more than 2 days ago (went on an ulcer diet consisting of jello, boiled noodles, and pasta.). The abdominal pain has been unchanged since its onset. The abdominal pain is located in the epigastric region.  The diarrhea began yesterday. The diarrhea is semi-solid (and foul smelling).  The illness does not include chills. Significant associated medical issues include irritable bowel syndrome.     Past Medical History:  Diagnosis Date  . Acute bronchitis   . Anxiety and depression   . Asthma   . Irritable bowel syndrome   . Malignant hypertension   . Obesity   . OSA (obstructive sleep apnea)   . Restless leg syndrome   . Stroke [facility])     Past Surgical History:  Procedure Laterality Date  . CHOLECYSTECTOMY    . saliva gland removal  1998  . TOTAL KNEE ARTHROPLASTY Left 2014  . TUBAL LIGATION Bilateral     Family History  Problem Relation Age of Onset  . Dementia Mother   . COPD Father        79  . Diabetes Father   . Heart failure Brother        2010    Social History   Social History  . Marital status: Married    Spouse name: N/A  . Number of children: 2  . Years of education: N/A   Occupational History  . Not on file.   Social History Main Topics  . Smoking status: Never Smoker  . Smokeless tobacco: Never Used  . Alcohol use No  . Drug use: No  . Sexual activity: No   Other Topics Concern  . Not on file   Social History Narrative  . No narrative on file     Current Outpatient Prescriptions:  .   budesonide-formoterol (SYMBICORT) 160-4.5 MCG/ACT inhaler, Inhale 2 puffs into the lungs 2 (two) times daily as needed., Disp: , Rfl:  .  cetirizine (ZYRTEC) 10 MG tablet, Take 1 tablet (10 mg total) by mouth daily., Disp: 30 tablet, Rfl: 2 .  diphenoxylate-atropine (LOMOTIL) 2.5-0.025 MG per tablet, Take 1 tablet by mouth 3 (three) times daily., Disp: 90 tablet, Rfl: 3 .  loperamide (IMODIUM A-D) 2 MG capsule, Take 2 mg by mouth 3 (three) times daily as needed for diarrhea or loose stools., Disp: , Rfl:  .  pantoprazole (PROTONIX) 40 MG tablet, Take 1 tablet (40 mg total) by mouth daily. (Patient taking differently: Take 40 mg by mouth 2 (two) times daily. ), Disp: 30 tablet, Rfl: 2 .  rOPINIRole (REQUIP) 2 MG tablet, Take 1 tablet (2 mg total) by mouth at bedtime., Disp: 30 tablet, Rfl: 2 .  Armodafinil (NUVIGIL) 200 MG TABS, Take 200 mg by mouth daily. (Patient not taking: Reported on 10/16/2016), Disp: 30 tablet, Rfl: 5 .  cholestyramine (QUESTRAN) 4 GM/DOSE powder, Take 1 packet (4 g total) by mouth daily. (Patient not taking: Reported on 10/16/2016), Disp: 378 g, Rfl: 5 .  escitalopram (LEXAPRO) 10 MG tablet,  Take 2 tablets (20 mg total) by mouth daily. (Patient not taking: Reported on 10/16/2016), Disp: 60 tablet, Rfl: 2  Allergies  Allergen Reactions  . Penicillins Diarrhea and Nausea And Vomiting     Review of Systems  Constitutional: Negative for chills, fever and weight loss.  Gastrointestinal: Positive for abdominal pain, diarrhea and nausea. Negative for vomiting.  Musculoskeletal: Positive for arthralgias and myalgias.     Objective  Vitals:   10/16/16 1142  BP: (!) 156/72  Pulse: (!) 103  Resp: 16  Temp: 98.2 F (36.8 C)  TempSrc: Oral  SpO2: 92%  Weight: 296 lb 3.2 oz (134.4 kg)  Height: 5\' 4"  (1.626 m)    Physical Exam  Constitutional: She is oriented to person, place, and time and well-developed, well-nourished, and in no distress.  HENT:  Head: Normocephalic  and atraumatic.  Cardiovascular: Normal rate, regular rhythm and normal heart sounds.   No murmur heard. Pulmonary/Chest: Effort normal and breath sounds normal.  Abdominal: Soft. Bowel sounds are normal. There is tenderness in the epigastric area.  Neurological: She is alert and oriented to person, place, and time.  Nursing note and vitals reviewed.      Assessment & Plan  1. Diarrhea in adult patient Likely a component of irritable bowel syndrome versus gastrointestinal irritation, changed antidiarrheal agent to Lomotil to be taken 3 times daily as needed for loose stools. She was advised to eat more fruits and vegetables to prevent onset of constipation - diphenoxylate-atropine (LOMOTIL) 2.5-0.025 MG tablet; Take 1 tablet by mouth 3 (three) times daily as needed for diarrhea or loose stools.  Dispense: 30 tablet; Refill: 0  2. Gastroesophageal reflux disease, esophagitis presence not specified Restart on pantoprazole 40 mg to be taken for gastric reflux - pantoprazole (PROTONIX) 40 MG tablet; Take 1 tablet (40 mg total) by mouth daily.  Dispense: 90 tablet; Refill: 0  3. Hypertension, unspecified type BP elevated, patient is not on any anti-hypertensive agent. Asked to check blood pressure at home and return in 1-2 weeks to be started on therapy if indicated.   Mariateresa Batra [facility] 10/16/2016 11:58 AM

## 2016-10-23 ENCOUNTER — Ambulatory Visit (INDEPENDENT_AMBULATORY_CARE_PROVIDER_SITE_OTHER): Payer: 59 | Admitting: Family Medicine

## 2016-10-23 ENCOUNTER — Encounter: Payer: Self-pay | Admitting: Family Medicine

## 2016-10-23 VITALS — BP 118/56 | HR 89 | Temp 98.1°F | Resp 20 | Ht 64.0 in | Wt 297.4 lb

## 2016-10-23 DIAGNOSIS — K219 Gastro-esophageal reflux disease without esophagitis: Secondary | ICD-10-CM

## 2016-10-23 DIAGNOSIS — G4726 Circadian rhythm sleep disorder, shift work type: Secondary | ICD-10-CM

## 2016-10-23 DIAGNOSIS — J4520 Mild intermittent asthma, uncomplicated: Secondary | ICD-10-CM

## 2016-10-23 MED ORDER — ARMODAFINIL 200 MG PO TABS: 200.0000 mg | ORAL_TABLET | Freq: Every day | ORAL | 0 refills | Status: DC

## 2016-10-23 MED ORDER — BUDESONIDE-FORMOTEROL FUMARATE 160-4.5 MCG/ACT IN AERO: 2.0000 | INHALATION_SPRAY | Freq: Two times a day (BID) | RESPIRATORY_TRACT | 2 refills | Status: DC | PRN

## 2016-10-23 NOTE — Progress Notes (Signed)
Name: Samantha Mason   MRN: [id number]    DOB: August 30, 1950   Date:10/23/2016       Progress Note  Subjective  Chief Complaint  Chief Complaint  Patient presents with  . Follow-up    1 week F/U BP  . Labs Only    Gastroesophageal Reflux  She complains of abdominal pain (burning in the epigastric region at times.) and chest pain (occasional chest pain). She reports no dysphagia, no heartburn, no nausea, no sore throat or no wheezing. This is a chronic problem. The problem occurs frequently. The problem has been gradually improving (Improved since she was started back on Protonix, ). Pertinent negatives include no melena or weight loss. Risk factors include obesity. She has tried a PPI for the symptoms. The treatment provided moderate relief. Past procedures do not include an abdominal ultrasound or an EGD.   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 No reported adverse effects.  Patient requesting some Court inhaler refill, she has chronic asthma, well controlled, no recent exacerbations, denies any shortness of breath   Past Medical History:  Diagnosis Date  . Acute bronchitis   . Anxiety and depression   . Asthma   . Irritable bowel syndrome   . Malignant hypertension   . Obesity   . OSA (obstructive sleep apnea)   . Restless leg syndrome   . Stroke [facility])     Past Surgical History:  Procedure Laterality Date  . CHOLECYSTECTOMY    . saliva gland removal  1998  . TOTAL KNEE ARTHROPLASTY Left 2014  . TUBAL LIGATION Bilateral     Family History  Problem Relation Age of Onset  . Dementia Mother   . COPD Father        60  . Diabetes Father   . Heart failure Brother        2010    Social History   Social History  . Marital status: Married    Spouse name: N/A  . Number of children: 2  . Years of education: N/A   Occupational  History  . Not on file.   Social History Main Topics  . Smoking status: Never Smoker  . Smokeless tobacco: Never Used  . Alcohol use No  . Drug use: No  . Sexual activity: No   Other Topics Concern  . Not on file   Social History Narrative  . No narrative on file     Current Outpatient Prescriptions:  .  Armodafinil (NUVIGIL) 200 MG TABS, Take 200 mg by mouth daily., Disp: 30 tablet, Rfl: 5 .  budesonide-formoterol (SYMBICORT) 160-4.5 MCG/ACT inhaler, Inhale 2 puffs into the lungs 2 (two) times daily as needed., Disp: , Rfl:  .  cetirizine (ZYRTEC) 10 MG tablet, Take 1 tablet (10 mg total) by mouth daily., Disp: 30 tablet, Rfl: 2 .  cholestyramine (QUESTRAN) 4 GM/DOSE powder, Take 1 packet (4 g total) by mouth daily., Disp: 378 g, Rfl: 5 .  diphenoxylate-atropine (LOMOTIL) 2.5-0.025 MG per tablet, Take 1 tablet by mouth 3 (three) times daily., Disp: 90 tablet, Rfl: 3 .  diphenoxylate-atropine (LOMOTIL) 2.5-0.025 MG tablet, Take 1 tablet by mouth 3 (three) times daily as needed for diarrhea or loose stools., Disp: 30 tablet, Rfl: 0 .  escitalopram (LEXAPRO) 10 MG tablet, Take 2 tablets (20 mg total) by mouth daily., Disp: 60 tablet, Rfl: 2 .  pantoprazole (PROTONIX) 40 MG tablet, Take 1 tablet (40 mg total) by mouth daily., Disp: 90 tablet, Rfl: 0 .  rOPINIRole (REQUIP) 2 MG tablet, Take 1 tablet (2 mg total) by mouth at bedtime., Disp: 30 tablet, Rfl: 2  Allergies  Allergen Reactions  . Penicillins Diarrhea and Nausea And Vomiting     Review of Systems  Constitutional: Negative for weight loss.  HENT: Negative for sore throat.   Respiratory: Negative for wheezing.   Cardiovascular: Positive for chest pain (occasional chest pain).  Gastrointestinal: Positive for abdominal pain (burning in the epigastric region at times.). Negative for dysphagia, heartburn, melena and nausea.      Objective  Vitals:   10/23/16 1138  BP: (!) 118/56  Pulse: 89  Resp: 20  Temp: 98.1 F  (36.7 C)  TempSrc: Oral  SpO2: 97%  Weight: 297 lb 6.4 oz (134.9 kg)  Height: 5\' 4"  (1.626 m)    Physical Exam  Constitutional: She is oriented to person, place, and time and well-developed, well-nourished, and in no distress.  HENT:  Head: Normocephalic and atraumatic.  Cardiovascular: Normal rate, regular rhythm and normal heart sounds.   No murmur heard. Pulmonary/Chest: Effort normal and breath sounds normal. She has no wheezes.  Abdominal: Soft. Bowel sounds are normal. There is no tenderness.  Musculoskeletal: She exhibits no edema.  Neurological: She is alert and oriented to person, place, and time.  Psychiatric: Mood, memory, affect and judgment normal.  Nursing note and vitals reviewed.     Assessment & Plan  1. Mild intermittent chronic asthma without complication Continue on maintenance Symbicort - budesonide-formoterol (SYMBICORT) 160-4.5 MCG/ACT inhaler; Inhale 2 puffs into the lungs 2 (two) times daily as needed.  Dispense: 1 Inhaler; Refill: 2  2. Shift work sleep disorder Prescription for Nuvigil provided - Armodafinil (NUVIGIL) 200 MG TABS; Take 200 mg by mouth daily.  Dispense: 30 tablet; Refill: 0  3. Gastroesophageal reflux disease, esophagitis presence not specified Referral to gastroenterology for consideration of endoscopy - Ambulatory referral to Gastroenterology   Rhyanna Sorce [facility] 10/23/2016 12:30 PM

## 2016-10-24 ENCOUNTER — Encounter: Payer: Self-pay | Admitting: Gastroenterology

## 2016-12-04 ENCOUNTER — Other Ambulatory Visit: Payer: Self-pay | Admitting: Family Medicine

## 2016-12-04 DIAGNOSIS — G4726 Circadian rhythm sleep disorder, shift work type: Secondary | ICD-10-CM

## 2016-12-17 ENCOUNTER — Other Ambulatory Visit: Payer: Self-pay | Admitting: Family Medicine

## 2016-12-17 DIAGNOSIS — G4726 Circadian rhythm sleep disorder, shift work type: Secondary | ICD-10-CM

## 2016-12-19 ENCOUNTER — Other Ambulatory Visit: Payer: Self-pay | Admitting: Family Medicine

## 2016-12-19 ENCOUNTER — Telehealth: Payer: Self-pay | Admitting: Family Medicine

## 2016-12-19 DIAGNOSIS — G4726 Circadian rhythm sleep disorder, shift work type: Secondary | ICD-10-CM

## 2016-12-19 NOTE — Telephone Encounter (Signed)
Dr. Manuella Ghazi, Pt was seen in June and was told to return in 3 months. She does have appointment scheduled for September. Asking for a refill on nuvigil. Please send to [place]

## 2016-12-19 NOTE — Telephone Encounter (Signed)
Pt was seen in June and was told to return in 3 months. She does have appointment scheduled for September. Asking for a refill on nuvigil. Please send to [place]

## 2016-12-19 NOTE — Telephone Encounter (Signed)
Prescription for armodafinil 200 mg daily has been sent to patient's pharmacy

## 2016-12-20 NOTE — Telephone Encounter (Signed)
Patient has been notified that prescription is ready for pickup

## 2016-12-30 ENCOUNTER — Other Ambulatory Visit: Payer: Self-pay | Admitting: Family Medicine

## 2016-12-30 DIAGNOSIS — G4726 Circadian rhythm sleep disorder, shift work type: Secondary | ICD-10-CM

## 2016-12-31 ENCOUNTER — Telehealth: Payer: Self-pay | Admitting: Family Medicine

## 2016-12-31 NOTE — Telephone Encounter (Signed)
PT SAID THAT PHARM IS SAYING YOU NEED HER TO HAVE AN APPT BEFORE SHE CAN GET THIS REFILLED BUT SHE HAS AN APPT WITH YOU 01-23-17 AT 11:05. 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

## 2017-01-02 NOTE — Telephone Encounter (Signed)
If this is regarding armodafinil, the prescription was signed on 12/19/2016. If patient has not received the prescription yet, it may be reprinted

## 2017-01-03 NOTE — Telephone Encounter (Signed)
Prescription has been reprinted

## 2017-01-23 ENCOUNTER — Ambulatory Visit: Payer: 59 | Admitting: Family Medicine

## 2017-03-12 ENCOUNTER — Other Ambulatory Visit
Admission: RE | Admit: 2017-03-12 | Discharge: 2017-03-12 | Disposition: A | Discharge status: Home / Self Care | Payer: 59 | Source: Ambulatory Visit | Attending: Family Medicine | Admitting: Family Medicine

## 2017-03-12 ENCOUNTER — Ambulatory Visit (INDEPENDENT_AMBULATORY_CARE_PROVIDER_SITE_OTHER): Payer: 59 | Admitting: Family Medicine

## 2017-03-12 ENCOUNTER — Encounter: Payer: Self-pay | Admitting: Family Medicine

## 2017-03-12 VITALS — BP 132/82 | HR 75 | Resp 16 | Ht 64.0 in | Wt 297.8 lb

## 2017-03-12 DIAGNOSIS — R2681 Unsteadiness on feet: Secondary | ICD-10-CM | POA: Diagnosis not present

## 2017-03-12 DIAGNOSIS — G4733 Obstructive sleep apnea (adult) (pediatric): Secondary | ICD-10-CM

## 2017-03-12 DIAGNOSIS — Z1239 Encounter for other screening for malignant neoplasm of breast: Secondary | ICD-10-CM

## 2017-03-12 DIAGNOSIS — J302 Other seasonal allergic rhinitis: Secondary | ICD-10-CM

## 2017-03-12 DIAGNOSIS — Z1231 Encounter for screening mammogram for malignant neoplasm of breast: Secondary | ICD-10-CM

## 2017-03-12 DIAGNOSIS — Z0001 Encounter for general adult medical examination with abnormal findings: Secondary | ICD-10-CM | POA: Diagnosis not present

## 2017-03-12 DIAGNOSIS — Z833 Family history of diabetes mellitus: Secondary | ICD-10-CM

## 2017-03-12 DIAGNOSIS — K589 Irritable bowel syndrome without diarrhea: Secondary | ICD-10-CM | POA: Insufficient documentation

## 2017-03-12 DIAGNOSIS — Z1211 Encounter for screening for malignant neoplasm of colon: Secondary | ICD-10-CM | POA: Diagnosis not present

## 2017-03-12 DIAGNOSIS — J4520 Mild intermittent asthma, uncomplicated: Secondary | ICD-10-CM | POA: Diagnosis not present

## 2017-03-12 DIAGNOSIS — K219 Gastro-esophageal reflux disease without esophagitis: Secondary | ICD-10-CM

## 2017-03-12 DIAGNOSIS — Z23 Encounter for immunization: Secondary | ICD-10-CM | POA: Diagnosis not present

## 2017-03-12 DIAGNOSIS — N183 Chronic kidney disease, stage 3 unspecified: Secondary | ICD-10-CM

## 2017-03-12 DIAGNOSIS — J309 Allergic rhinitis, unspecified: Secondary | ICD-10-CM | POA: Insufficient documentation

## 2017-03-12 DIAGNOSIS — G2581 Restless legs syndrome: Secondary | ICD-10-CM

## 2017-03-12 DIAGNOSIS — K580 Irritable bowel syndrome with diarrhea: Secondary | ICD-10-CM

## 2017-03-12 DIAGNOSIS — Z0000 Encounter for general adult medical examination without abnormal findings: Secondary | ICD-10-CM

## 2017-03-12 DIAGNOSIS — Z9989 Dependence on other enabling machines and devices: Secondary | ICD-10-CM

## 2017-03-12 LAB — LIPID PANEL
CHOL/HDL RATIO: 2.5 ratio
Cholesterol: 138 mg/dL (ref 0–200)
HDL: 56 mg/dL (ref >40)
LDL CALC: 66 mg/dL (ref 0–99)
Triglycerides: 81 mg/dL (ref <150)
VLDL: 16 mg/dL (ref 0–40)

## 2017-03-12 LAB — CBC
HEMATOCRIT: 35.1 % (ref 35.0–47.0)
HEMOGLOBIN: 11.4 g/dL — AB (ref 12.0–16.0)
MCH: 25.6 pg — AB (ref 26.0–34.0)
MCHC: 32.5 g/dL (ref 32.0–36.0)
MCV: 79 fL — AB (ref 80.0–100.0)
Platelets: 244 10*3/uL (ref 150–440)
RBC: 4.44 MIL/uL (ref 3.80–5.20)
RDW: 15.2 % — ABNORMAL HIGH (ref 11.5–14.5)
WBC: 7.2 10*3/uL (ref 3.6–11.0)

## 2017-03-12 LAB — COMPREHENSIVE METABOLIC PANEL
ALT: 12 U/L — ABNORMAL LOW (ref 14–54)
ANION GAP: 9 (ref 5–15)
AST: 16 U/L (ref 15–41)
Albumin: 3.6 g/dL (ref 3.5–5.0)
Alkaline Phosphatase: 73 U/L (ref 38–126)
BUN: 18 mg/dL (ref 6–20)
CO2: 29 mmol/L (ref 22–32)
Calcium: 8.7 mg/dL — ABNORMAL LOW (ref 8.9–10.3)
Chloride: 101 mmol/L (ref 101–111)
Creatinine, Ser: 0.95 mg/dL (ref 0.44–1.00)
GFR calc non Af Amer: >60 mL/min (ref >60)
GLUCOSE: 102 mg/dL — AB (ref 65–99)
Potassium: 3.6 mmol/L (ref 3.5–5.1)
SODIUM: 139 mmol/L (ref 135–145)
Total Bilirubin: 0.7 mg/dL (ref 0.3–1.2)
Total Protein: 7.2 g/dL (ref 6.5–8.1)

## 2017-03-12 LAB — TSH: TSH: 2.615 u[IU]/mL (ref 0.350–4.500)

## 2017-03-12 LAB — VITAMIN B12: VITAMIN B 12: 173 pg/mL — AB (ref 180–914)

## 2017-03-12 MED ORDER — CHOLESTYRAMINE 4 GM/DOSE PO POWD: 4.0000 g | Freq: Every day | ORAL | 5 refills | Status: DC

## 2017-03-12 MED ORDER — ZOSTER VAC RECOMB ADJUVANTED 50 MCG/0.5ML IM SUSR: 0.5000 mL | Freq: Once | INTRAMUSCULAR | 1 refills | Status: AC

## 2017-03-12 MED ORDER — ROPINIROLE HCL 1 MG PO TABS: 1.0000 mg | ORAL_TABLET | Freq: Every day | ORAL | 3 refills | Status: DC

## 2017-03-13 LAB — HIV ANTIBODY (ROUTINE TESTING W REFLEX): HIV Screen 4th Generation wRfx: NONREACTIVE

## 2017-03-13 LAB — HEPATITIS C ANTIBODY: HCV Ab: <0.1 s/co ratio (ref 0.0–0.9)

## 2017-03-13 LAB — HEMOGLOBIN A1C
Hgb A1c MFr Bld: 5.7 % — ABNORMAL HIGH (ref 4.8–5.6)
Mean Plasma Glucose: 116.89 mg/dL

## 2017-03-13 NOTE — Progress Notes (Addendum)
Date:  03/12/2017   Name:  Samantha Mason   DOB:  12-26-1950   MRN:  [id number]  PCP:  Roselee Nova, MD    Chief Complaint: Establish Care   History of Present Illness:  This is a 66 y.o. female seen for initial visit. S/p L TKR 2013, "light stroke" 1995 felt stress-induced. RLS on Requip, IBS with diarrhea, uses Questran daily and Phenergan prn, none in 8 months. GERD on Zantac qAM. Has used Provigil to stay awake during shift work in past and requests refill. AR on Zyrtec daily. Obesity stable, no regular exercise, torn cartilage R knee. CKD3, eGFR 47 in 2016. Hx asthma and shingles. OSA on CPAP. Father diedCOPD 9, mother alive 9 CHF/COPD/dementia, brother died 45 heart dz/DM. Had flu imm in August, Prevnar 2015, colonoscopy > 10 yrs ago, requests Cologuard, no recent mammogram.  Review of Systems:  Review of Systems  Constitutional: Negative for chills and fever.  HENT: Negative for ear pain, sinus pain, sore throat and trouble swallowing.   Eyes: Negative for pain.  Respiratory: Negative for cough and shortness of breath.   Cardiovascular: Negative for chest pain and leg swelling.  Gastrointestinal: Negative for abdominal pain.  Genitourinary: Negative for difficulty urinating.  Musculoskeletal: Negative for joint swelling.  Neurological: Negative for syncope and light-headedness.    Patient Active Problem List   Diagnosis Date Noted  . IBS (irritable bowel syndrome) 03/12/2017  . Allergic rhinitis 03/12/2017  . Restless leg syndrome 03/12/2017  . FH: diabetes mellitus 03/12/2017  . Diarrhea in adult patient 10/16/2016  . Acid reflux 10/16/2016  . Thyroid activity decreased 12/08/2014  . OSA on CPAP 12/08/2014  . CKD (chronic kidney disease) stage 3, GFR 30-59 ml/min (HCC) 12/08/2014  . Asthma, chronic 05/31/2014  . Obesity, morbid ([place]) 05/31/2014    Prior to Admission medications   Medication Sig Start Date End Date Taking? Authorizing Provider  cetirizine  (ZYRTEC) 10 MG tablet Take 1 tablet (10 mg total) by mouth daily. 12/07/14  Yes Ashok Norris, MD  cholestyramine Lucrezia Starch) 4 GM/DOSE powder Take 1 packet (4 g total) by mouth daily. 03/12/17  Yes Malary Aylesworth, [name], MD  diphenoxylate-atropine (LOMOTIL) 2.5-0.025 MG tablet Take 1 tablet by mouth 3 (three) times daily as needed for diarrhea or loose stools. 10/16/16  Yes Roselee Nova, MD  promethazine (PHENERGAN) 12.5 MG tablet Take 12.5 mg by mouth every 6 (six) hours as needed for nausea or vomiting.   Yes [provider]  ranitidine (ZANTAC) 150 MG capsule Take 150 mg by mouth daily.   Yes [provider]  rOPINIRole (REQUIP) 1 MG tablet Take 1 tablet (1 mg total) by mouth at bedtime. 03/12/17  Yes Joycelyn Liska, [name], MD    Allergies  Allergen Reactions  . Penicillins Diarrhea and Nausea And Vomiting    Past Surgical History:  Procedure Laterality Date  . CHOLECYSTECTOMY    . saliva gland removal  1998  . TOTAL KNEE ARTHROPLASTY Left 2014  . TUBAL LIGATION Bilateral     Social History  Substance Use Topics  . Smoking status: Never Smoker  . Smokeless tobacco: Never Used  . Alcohol use No    Family History  Problem Relation Age of Onset  . Dementia Mother   . COPD Father        53  . Diabetes Father   . Heart failure Brother        2010    Medication list has been reviewed and  updated.  Physical Examination: BP 132/82   Pulse 75   Resp 16   Ht '5\' 4"'$  (1.626 m)   Wt 297 lb 12.8 oz (135.1 kg)   SpO2 96%   BMI 51.12 kg/m   Physical Exam  Constitutional: She is oriented to person, place, and time. She appears well-developed and well-nourished.  HENT:  Head: Normocephalic and atraumatic.  Right Ear: External ear normal.  Left Ear: External ear normal.  Nose: Nose normal.  Mouth/Throat: Oropharynx is clear and moist.  TMs clear  Eyes: Pupils are equal, round, and reactive to light. Conjunctivae and EOM are normal.  Neck: Neck supple. No  thyromegaly present.  Cardiovascular: Normal rate, regular rhythm, normal heart sounds and intact distal pulses.   Pulmonary/Chest: Effort normal and breath sounds normal.  Abdominal: Soft. She exhibits no distension and no mass. There is no tenderness.  Musculoskeletal: She exhibits no edema.  Lymphadenopathy:    She has no cervical adenopathy.  Neurological: She is alert and oriented to person, place, and time. Coordination normal.  Romberg negative, gait wide-based  Skin: Skin is warm and dry.  Psychiatric: She has a normal mood and affect. Her behavior is normal.  Nursing note and vitals reviewed.   Assessment and Plan:  1. CKD (chronic kidney disease) stage 3, GFR 30-59 ml/min (HCC) eGFR 47 in 2016 - Comprehensive Metabolic Panel (CMET) - CBC  2. OSA on CPAP Discussed benefit of weight loss  3. Mild intermittent chronic asthma without complication No current meds  4. Gastroesophageal reflux disease, esophagitis presence not specified On Zantac daily, consider d/c  5. Obesity, morbid (HCC) Exercise/weight loss discussed - TSH - Lipid Profile  6. Irritable bowel syndrome with diarrhea Well controlled on Questran/Phenergan prn  7. Restless leg syndrome Well controlled on Requip  8. Seasonal allergic rhinitis, unspecified trigger Well controlled on Zyrtec daily  9. Gait instability - B12  10. FH: diabetes mellitus - HgB A1c  11. Need for pneumococcal vaccination - Pneumococcal polysaccharide vaccine 23-valent greater than or equal to 2yo subcutaneous/IM  12. Need for zoster vaccination - Zoster Vaccine Adjuvanted [facility]) injection; Inject 0.5 mLs into the muscle once.  Dispense: 0.5 mL; Refill: 1  13. Screening for colon cancer - Cologuard  14. Screening for breast cancer - MM Digital Screening; Future  15. Healthcare maintenance - HIV antibody (with reflex) - Hepatitis C Antibody  Return in about 4 weeks (around 04/09/2017).   45 mins spent  with pt, over half in counseling  Satira Anis. [facility]  03/13/2017   Addendum: Request for Provigil refill declined.

## 2017-03-14 ENCOUNTER — Encounter: Payer: Self-pay | Admitting: Family Medicine

## 2017-03-14 ENCOUNTER — Other Ambulatory Visit: Payer: Self-pay | Admitting: Family Medicine

## 2017-03-14 DIAGNOSIS — R7303 Prediabetes: Secondary | ICD-10-CM | POA: Insufficient documentation

## 2017-03-14 DIAGNOSIS — E538 Deficiency of other specified B group vitamins: Secondary | ICD-10-CM

## 2017-03-14 DIAGNOSIS — D509 Iron deficiency anemia, unspecified: Secondary | ICD-10-CM | POA: Insufficient documentation

## 2017-03-14 HISTORY — DX: Deficiency of other specified B group vitamins: E53.8

## 2017-03-14 MED ORDER — B-12 1000 MCG PO TABS: 1.0000 | ORAL_TABLET | Freq: Every day | ORAL | Status: DC

## 2017-03-14 NOTE — Progress Notes (Signed)
B12 level low, begin oral replacement

## 2017-03-15 LAB — VITAMIN D 25 HYDROXY (VIT D DEFICIENCY, FRACTURES): VIT D 25 HYDROXY: 8.9 ng/mL — AB (ref 30.0–100.0)

## 2017-03-17 ENCOUNTER — Other Ambulatory Visit: Payer: Self-pay | Admitting: Family Medicine

## 2017-03-17 DIAGNOSIS — E559 Vitamin D deficiency, unspecified: Secondary | ICD-10-CM

## 2017-03-17 MED ORDER — VITAMIN D3 125 MCG (5000 UT) PO CAPS: 1.0000 | ORAL_CAPSULE | Freq: Every day | ORAL | Status: DC

## 2017-03-17 NOTE — Progress Notes (Signed)
Vit D level low, begin oral replacement

## 2017-04-09 ENCOUNTER — Encounter: Payer: Self-pay | Admitting: Family Medicine

## 2017-04-09 ENCOUNTER — Ambulatory Visit (INDEPENDENT_AMBULATORY_CARE_PROVIDER_SITE_OTHER): Payer: 59 | Admitting: Family Medicine

## 2017-04-09 DIAGNOSIS — K219 Gastro-esophageal reflux disease without esophagitis: Secondary | ICD-10-CM

## 2017-04-09 DIAGNOSIS — R7303 Prediabetes: Secondary | ICD-10-CM

## 2017-04-09 DIAGNOSIS — E559 Vitamin D deficiency, unspecified: Secondary | ICD-10-CM | POA: Diagnosis not present

## 2017-04-09 DIAGNOSIS — E538 Deficiency of other specified B group vitamins: Secondary | ICD-10-CM

## 2017-04-09 DIAGNOSIS — Z9989 Dependence on other enabling machines and devices: Secondary | ICD-10-CM | POA: Diagnosis not present

## 2017-04-09 DIAGNOSIS — K580 Irritable bowel syndrome with diarrhea: Secondary | ICD-10-CM

## 2017-04-09 DIAGNOSIS — G4733 Obstructive sleep apnea (adult) (pediatric): Secondary | ICD-10-CM | POA: Diagnosis not present

## 2017-04-09 MED ORDER — ACETAMINOPHEN 500 MG PO TABS: 1000.0000 mg | ORAL_TABLET | Freq: Three times a day (TID) | ORAL | 0 refills | Status: DC | PRN

## 2017-04-09 NOTE — Progress Notes (Signed)
Date:  04/09/2017   Name:  Samantha Mason   DOB:  04/14/1951   MRN:  [id number]  PCP:  Adline Potter, MD    Chief Complaint: Chronic Kidney Disease   History of Present Illness:  This is a 66 y.o. female seen for one month f/u from initial visit. Blood work showed low B12/vit D and prediabetes, CKD3 resolved. Still on daily Zantac, willing to try taper. Using Aleve prn pain, IBS/RLS sxs stable.   Review of Systems:  Review of Systems  Constitutional: Negative for chills and fever.  Respiratory: Negative for cough and shortness of breath.   Cardiovascular: Negative for chest pain and leg swelling.  Genitourinary: Negative for difficulty urinating.  Neurological: Negative for syncope and light-headedness.    Patient Active Problem List   Diagnosis Date Noted  . Vitamin D deficiency 03/17/2017  . Prediabetes 03/14/2017  . Microcytic anemia 03/14/2017  . B12 deficiency 03/14/2017  . IBS (irritable bowel syndrome) 03/12/2017  . Allergic rhinitis 03/12/2017  . Restless leg syndrome 03/12/2017  . FH: diabetes mellitus 03/12/2017  . Diarrhea in adult patient 10/16/2016  . Acid reflux 10/16/2016  . OSA on CPAP 12/08/2014  . Asthma, chronic 05/31/2014  . Obesity, morbid ([place]) 05/31/2014    Prior to Admission medications   Medication Sig Start Date End Date Taking? Authorizing Provider  cetirizine (ZYRTEC) 10 MG tablet Take 1 tablet (10 mg total) by mouth daily. 12/07/14  Yes Ashok Norris, MD  Cholecalciferol (VITAMIN D3) 5000 units CAPS Take 1 capsule (5,000 Units total) by mouth daily. 03/17/17  Yes Anay Walter, [name], MD  cholestyramine Lucrezia Starch) 4 GM/DOSE powder Take 1 packet (4 g total) by mouth daily. 03/12/17  Yes Samantha Mason, [name], MD  Cyanocobalamin (B-12) 1000 MCG TABS Take 1 tablet by mouth daily. 03/14/17  Yes Melanee Cordial, [name], MD  diphenoxylate-atropine (LOMOTIL) 2.5-0.025 MG tablet Take 1 tablet by mouth 3 (three) times daily as needed for diarrhea or loose stools.  10/16/16  Yes Roselee Nova, MD  promethazine (PHENERGAN) 12.5 MG tablet Take 12.5 mg by mouth every 6 (six) hours as needed for nausea or vomiting.   Yes [provider]  ranitidine (ZANTAC) 150 MG capsule Take 150 mg by mouth daily as needed.   Yes [provider]  rOPINIRole (REQUIP) 1 MG tablet Take 1 tablet (1 mg total) by mouth at bedtime. 03/12/17  Yes Lauria Depoy, [name], MD  acetaminophen (TYLENOL) 500 MG tablet Take 2 tablets (1,000 mg total) by mouth every 8 (eight) hours as needed for moderate pain. 04/09/17   Adline Potter, MD    Allergies  Allergen Reactions  . Penicillins Diarrhea and Nausea And Vomiting    Past Surgical History:  Procedure Laterality Date  . CHOLECYSTECTOMY    . saliva gland removal  1998  . TOTAL KNEE ARTHROPLASTY Left 2014  . TUBAL LIGATION Bilateral     Social History   Tobacco Use  . Smoking status: Never Smoker  . Smokeless tobacco: Never Used  Substance Use Topics  . Alcohol use: No    Alcohol/week: 0.0 oz  . Drug use: No    Family History  Problem Relation Age of Onset  . Dementia Mother   . COPD Father        64  . Diabetes Father   . Heart failure Brother        2010    Medication list has been reviewed and updated.  Physical Examination: BP (!) 144/89   Pulse 75  Resp 16   Ht 5\' 4"  (1.626 m)   Wt 295 lb (133.8 kg)   SpO2 97%   BMI 50.64 kg/m   Physical Exam  Constitutional: She appears well-developed and well-nourished.  Cardiovascular: Normal rate, regular rhythm and normal heart sounds.  Pulmonary/Chest: Effort normal and breath sounds normal.  Musculoskeletal: She exhibits no edema.  Neurological: She is alert.  Skin: Skin is warm and dry.  Psychiatric: She has a normal mood and affect. Her behavior is normal.  Nursing note and vitals reviewed.   Assessment and Plan:  1. Obesity, morbid ([place]) Weight down 2#, exercise/weight loss discussed  2. Gastroesophageal reflux disease,  esophagitis presence not specified Attempt Zantac wean  3. Irritable bowel syndrome with diarrhea Stable on current regimen  4. OSA on CPAP Stable  5. Prediabetes Dx/px discussed, a1c next visit  6. B12 deficiency On supplement, level next visit  7. Vitamin D deficiency On supplement, level next visit  Return in about 3 months (around 07/10/2017).  Satira Anis. Dawsyn Ramsaran, [facility]  04/09/2017

## 2017-07-16 ENCOUNTER — Encounter: Payer: Self-pay | Admitting: Family Medicine

## 2017-07-16 ENCOUNTER — Other Ambulatory Visit
Admission: RE | Admit: 2017-07-16 | Discharge: 2017-07-16 | Disposition: A | Discharge status: Home / Self Care | Payer: 59 | Source: Ambulatory Visit | Attending: Family Medicine | Admitting: Family Medicine

## 2017-07-16 ENCOUNTER — Ambulatory Visit: Payer: 59 | Admitting: Family Medicine

## 2017-07-16 DIAGNOSIS — G4733 Obstructive sleep apnea (adult) (pediatric): Secondary | ICD-10-CM | POA: Diagnosis not present

## 2017-07-16 DIAGNOSIS — K219 Gastro-esophageal reflux disease without esophagitis: Secondary | ICD-10-CM

## 2017-07-16 DIAGNOSIS — Z23 Encounter for immunization: Secondary | ICD-10-CM

## 2017-07-16 DIAGNOSIS — K580 Irritable bowel syndrome with diarrhea: Secondary | ICD-10-CM

## 2017-07-16 DIAGNOSIS — E559 Vitamin D deficiency, unspecified: Secondary | ICD-10-CM | POA: Insufficient documentation

## 2017-07-16 DIAGNOSIS — G2581 Restless legs syndrome: Secondary | ICD-10-CM

## 2017-07-16 DIAGNOSIS — J302 Other seasonal allergic rhinitis: Secondary | ICD-10-CM

## 2017-07-16 DIAGNOSIS — Z8249 Family history of ischemic heart disease and other diseases of the circulatory system: Secondary | ICD-10-CM | POA: Diagnosis not present

## 2017-07-16 DIAGNOSIS — R7303 Prediabetes: Secondary | ICD-10-CM | POA: Insufficient documentation

## 2017-07-16 DIAGNOSIS — D509 Iron deficiency anemia, unspecified: Secondary | ICD-10-CM

## 2017-07-16 DIAGNOSIS — E538 Deficiency of other specified B group vitamins: Secondary | ICD-10-CM | POA: Diagnosis not present

## 2017-07-16 DIAGNOSIS — Z9989 Dependence on other enabling machines and devices: Secondary | ICD-10-CM

## 2017-07-16 HISTORY — DX: Family history of ischemic heart disease and other diseases of the circulatory system: Z82.49

## 2017-07-16 LAB — COMPREHENSIVE METABOLIC PANEL
ALBUMIN: 3.6 g/dL (ref 3.5–5.0)
ALK PHOS: 84 U/L (ref 38–126)
ALT: 19 U/L (ref 14–54)
ANION GAP: 6 (ref 5–15)
AST: 21 U/L (ref 15–41)
BUN: 14 mg/dL (ref 6–20)
CHLORIDE: 102 mmol/L (ref 101–111)
CO2: 30 mmol/L (ref 22–32)
Calcium: 8.9 mg/dL (ref 8.9–10.3)
Creatinine, Ser: 0.92 mg/dL (ref 0.44–1.00)
GFR calc Af Amer: >60 mL/min (ref >60)
GFR calc non Af Amer: >60 mL/min (ref >60)
Glucose, Bld: 91 mg/dL (ref 65–99)
POTASSIUM: 4.4 mmol/L (ref 3.5–5.1)
SODIUM: 138 mmol/L (ref 135–145)
Total Bilirubin: 0.5 mg/dL (ref 0.3–1.2)
Total Protein: 7.4 g/dL (ref 6.5–8.1)

## 2017-07-16 LAB — CBC
HEMATOCRIT: 35 % (ref 35.0–47.0)
HEMOGLOBIN: 11.4 g/dL — AB (ref 12.0–16.0)
MCH: 25.6 pg — AB (ref 26.0–34.0)
MCHC: 32.7 g/dL (ref 32.0–36.0)
MCV: 78.3 fL — AB (ref 80.0–100.0)
Platelets: 250 10*3/uL (ref 150–440)
RBC: 4.47 MIL/uL (ref 3.80–5.20)
RDW: 16.3 % — ABNORMAL HIGH (ref 11.5–14.5)
WBC: 8.6 10*3/uL (ref 3.6–11.0)

## 2017-07-16 LAB — HEMOGLOBIN A1C
HEMOGLOBIN A1C: 5.9 % — AB (ref 4.8–5.6)
Mean Plasma Glucose: 122.63 mg/dL

## 2017-07-16 LAB — FERRITIN: FERRITIN: 19 ng/mL (ref 11–307)

## 2017-07-16 LAB — VITAMIN B12: Vitamin B-12: 191 pg/mL (ref 180–914)

## 2017-07-16 LAB — SEDIMENTATION RATE: SED RATE: 57 mm/h — AB (ref 0–30)

## 2017-07-16 NOTE — Progress Notes (Signed)
Date:  07/16/2017   Name:  Samantha Mason   DOB:  [birth year]   MRN:  [id number]  PCP:  Adline Potter, MD    Chief Complaint: Obesity (f/u )   History of Present Illness:  This is a 67 y.o. female seen for three month f/u. Mother on hospice, going downhill. Concerned about FH blood clots, wonders what testing needs to be done. GERD taking Zantac twice weekly only. Using CPAP nightly. Prediabetes, B12 def, vit D def dx'd in Oct, on B12/D supplements. Also had microcytic anemia, TSh ok. IBS worse with family stress, RLS well controlled on Requip. BLE not improving overnight.  Review of Systems:  Review of Systems  Constitutional: Negative for chills and fever.  Respiratory: Negative for cough and shortness of breath.   Cardiovascular: Negative for chest pain.  Genitourinary: Negative for difficulty urinating.  Musculoskeletal: Negative for joint swelling.  Neurological: Negative for syncope and light-headedness.    Patient Active Problem List   Diagnosis Date Noted  . Family history of blood clots 07/16/2017  . Vitamin D deficiency 03/17/2017  . Prediabetes 03/14/2017  . Microcytic anemia 03/14/2017  . B12 deficiency 03/14/2017  . IBS (irritable bowel syndrome) 03/12/2017  . Allergic rhinitis 03/12/2017  . Restless leg syndrome 03/12/2017  . FH: diabetes mellitus 03/12/2017  . Diarrhea in adult patient 10/16/2016  . Acid reflux 10/16/2016  . OSA on CPAP 12/08/2014  . Asthma, chronic 05/31/2014  . Obesity, morbid ([place]) 05/31/2014    Prior to Admission medications   Medication Sig Start Date End Date Taking? Authorizing Provider  acetaminophen (TYLENOL) 500 MG tablet Take 2 tablets (1,000 mg total) by mouth every 8 (eight) hours as needed for moderate pain. 04/09/17  Yes Sander Remedios, [name], MD  aspirin 81 MG tablet Take 81 mg by mouth daily.   Yes [provider]  cetirizine (ZYRTEC) 10 MG tablet Take 1 tablet (10 mg total) by mouth daily. 12/07/14  Yes Ashok Norris, MD  Cholecalciferol (VITAMIN D3) 5000 units CAPS Take 1 capsule (5,000 Units total) by mouth daily. 03/17/17  Yes Amelie Caracci, [name], MD  cholestyramine Lucrezia Starch) 4 GM/DOSE powder Take 1 packet (4 g total) by mouth daily. 03/12/17  Yes Aaiden Depoy, [name], MD  Cyanocobalamin (B-12) 1000 MCG TABS Take 1 tablet by mouth daily. 03/14/17  Yes Juvia Aerts, [name], MD  diphenoxylate-atropine (LOMOTIL) 2.5-0.025 MG tablet Take 1 tablet by mouth 3 (three) times daily as needed for diarrhea or loose stools. 10/16/16  Yes Roselee Nova, MD  promethazine (PHENERGAN) 12.5 MG tablet Take 12.5 mg by mouth every 6 (six) hours as needed for nausea or vomiting.   Yes [provider]  ranitidine (ZANTAC) 150 MG capsule Take 150 mg by mouth daily as needed.   Yes [provider]  rOPINIRole (REQUIP) 1 MG tablet Take 1 tablet (1 mg total) by mouth at bedtime. 03/12/17  Yes Daichi Moris, [name], MD    Allergies  Allergen Reactions  . Penicillins Diarrhea and Nausea And Vomiting    Past Surgical History:  Procedure Laterality Date  . CHOLECYSTECTOMY    . saliva gland removal  1998  . TOTAL KNEE ARTHROPLASTY Left 2014  . TUBAL LIGATION Bilateral     Social History   Tobacco Use  . Smoking status: Never Smoker  . Smokeless tobacco: Never Used  Substance Use Topics  . Alcohol use: No    Alcohol/week: 0.0 oz  . Drug use: No    Family History  Problem Relation Age of  Onset  . Dementia Mother   . COPD Father        5  . Diabetes Father   . Heart failure Brother        2010    Medication list has been reviewed and updated.  Physical Examination: BP 122/82   Pulse 79   Resp 16   Ht '5\' 4"'$  (1.626 m)   Wt (!) 300 lb 12.8 oz (136.4 kg)   SpO2 95%   BMI 51.63 kg/m   Physical Exam  Constitutional: She appears well-developed and well-nourished.  Cardiovascular: Normal rate, regular rhythm and normal heart sounds.  Pulmonary/Chest: Effort normal and breath sounds normal.   Musculoskeletal:  1+ BLE edema  Neurological: She is alert.  Skin: Skin is warm and dry.  Psychiatric: She has a normal mood and affect. Her behavior is normal.  Nursing note and vitals reviewed.   Assessment and Plan:  1. Obesity, morbid ([place]) Weight up 5#, exercise/weight loss discussed, consider MNT referral  2. OSA on CPAP Stable  3. Irritable bowel syndrome with diarrhea Worse lately due to stress, cont Questran/Lomotil/Phenergan prn  4. Gastroesophageal reflux disease, esophagitis presence not specified Stable on Zantac prn  5. Prediabetes - HgB A1c  6. Restless leg syndrome Well controlled on Requip  7. Seasonal allergic rhinitis, unspecified trigger Well controlled on Zyrtec  8. Microcytic anemia Cologuard ordered (? returned), consider colonoscopy if persists - CBC - Ferritin  9. B12 deficiency On supplement - B12  10. Vitamin D deficiency On supplement - Vitamin D (25 hydroxy)  11. Family history of blood clots Consider asa 81 mg daily, hypercoag testing - Sed Rate (ESR) - Comprehensive Metabolic Panel (CMET)  12. Need for pneumococcal vaccination - Pneumococcal polysaccharide vaccine 23-valent greater than or equal to 2yo subcutaneous/IM  Return in about 6 months (around 01/13/2018).  Satira Anis. [facility]  07/16/2017

## 2017-07-17 LAB — VITAMIN D 25 HYDROXY (VIT D DEFICIENCY, FRACTURES): VIT D 25 HYDROXY: 5.1 ng/mL — AB (ref 30.0–100.0)

## 2017-10-03 ENCOUNTER — Ambulatory Visit: Payer: 59 | Admitting: Podiatry

## 2017-10-03 ENCOUNTER — Ambulatory Visit: Payer: Self-pay | Admitting: Podiatry

## 2017-10-03 ENCOUNTER — Ambulatory Visit (INDEPENDENT_AMBULATORY_CARE_PROVIDER_SITE_OTHER): Payer: 59

## 2017-10-03 ENCOUNTER — Encounter: Payer: Self-pay | Admitting: Podiatry

## 2017-10-03 DIAGNOSIS — M7752 Other enthesopathy of left foot: Secondary | ICD-10-CM

## 2017-10-03 DIAGNOSIS — M779 Enthesopathy, unspecified: Secondary | ICD-10-CM | POA: Diagnosis not present

## 2017-10-03 DIAGNOSIS — M84375A Stress fracture, left foot, initial encounter for fracture: Secondary | ICD-10-CM

## 2017-10-06 MED ORDER — NONFORMULARY OR COMPOUNDED ITEM: 2 refills | Status: DC

## 2017-10-06 NOTE — Progress Notes (Signed)
   HPI: 67 year old female presenting today as a new patient with a chief complaint of progressively worsening, constant, sharp, stabbing pain to the dorsum of the left foot that began 2 months ago. She reports an associated painful nodule to the area. She states the pain wakes her at night. Walking and standing increases the pain. Icing helps alleviate the pain minimally. She has been using Voltaren gel with no significant relief. Patient is here for further evaluation and treatment.   Past Medical History:  Diagnosis Date  . Acute bronchitis   . Anxiety and depression   . Asthma   . Irritable bowel syndrome   . Malignant hypertension   . Obesity   . OSA (obstructive sleep apnea)   . Restless leg syndrome   . Stroke [facility])      Physical Exam: General: The patient is alert and oriented x3 in no acute distress.  Dermatology: Skin is warm, dry and supple bilateral lower extremities. Negative for open lesions or macerations.  Vascular: Palpable pedal pulses bilaterally. No edema or erythema noted. Capillary refill within normal limits.  Neurological: Epicritic and protective threshold grossly intact bilaterally.   Musculoskeletal Exam: Pain with palpation to the dorsolateral left foot. Range of motion within normal limits to all pedal and ankle joints bilateral. Muscle strength 5/5 in all groups bilateral.   Radiographic Exam:  Normal osseous mineralization. Joint spaces preserved. No fracture/dislocation/boney destruction.    Assessment: 1. Tendinitis dorsolateral left foot   Plan of Care:  1. Patient evaluated. X-Rays reviewed.  2. Injection of 0.5 mLs Celestone Soluspan injected into the dorsolateral midfoot.  3. Prescription for pain cream to be dispensed from [facility].  4. Compression anklet dispensed.  5. H/o gastritis. Patient cannot take oral NSAIDs.  6. Return to clinic in 4 weeks.      Edrick Kins, DPM [facility]  Dr. Edrick Kins, DPM     [address], [place] [address]                Office [phone]  Fax [phone]

## 2017-11-04 ENCOUNTER — Ambulatory Visit: Payer: 59 | Admitting: Podiatry

## 2017-11-04 ENCOUNTER — Encounter: Payer: Self-pay | Admitting: Podiatry

## 2017-11-04 DIAGNOSIS — M779 Enthesopathy, unspecified: Secondary | ICD-10-CM | POA: Diagnosis not present

## 2017-11-04 DIAGNOSIS — M7752 Other enthesopathy of left foot: Secondary | ICD-10-CM

## 2017-11-04 MED ORDER — METHYLPREDNISOLONE 4 MG PO TBPK: ORAL_TABLET | ORAL | 0 refills | Status: DC

## 2017-11-07 NOTE — Progress Notes (Signed)
   HPI: 67 year old female presenting today for follow up evaluation of left foot pain. She states her pain has improved. She does, however, note she is now experiencing throbbing pain to the left ankle if she walks without the ankle brace. Wearing the compression anklet helps alleviate her symptoms. Patient is here for further evaluation and treatment.   Past Medical History:  Diagnosis Date  . Acute bronchitis   . Anxiety and depression   . Asthma   . Irritable bowel syndrome   . Malignant hypertension   . Obesity   . OSA (obstructive sleep apnea)   . Restless leg syndrome   . Stroke [facility])      Physical Exam: General: The patient is alert and oriented x3 in no acute distress.  Dermatology: Skin is warm, dry and supple bilateral lower extremities. Negative for open lesions or macerations.  Vascular: Palpable pedal pulses bilaterally. No edema or erythema noted. Capillary refill within normal limits.  Neurological: Epicritic and protective threshold grossly intact bilaterally.   Musculoskeletal Exam: Pain with palpation to the dorsolateral left foot. Range of motion within normal limits to all pedal and ankle joints bilateral. Muscle strength 5/5 in all groups bilateral.   Assessment: 1. Extensor tendinitis left    Plan of Care:  1. Patient evaluated.  2. Injection of 0.5 mLs Celestone Soluspan injected into the dorsolateral midfoot along the extensor tendon sheath 3. Prescription for Medrol Dose Pak provided to patient.  4. Ankle brace dispensed.  5. Continue using pain cream from [employer].  6. H/o gastritis. Patient cannot take oral NSAIDs.  7. Return to clinic in 4 weeks.      Edrick Kins, DPM [facility]  Dr. Edrick Kins, DPM    [address], [place] [address]                Office [phone]  Fax [phone]

## 2017-12-02 ENCOUNTER — Ambulatory Visit: Payer: 59 | Admitting: Podiatry

## 2018-01-11 ENCOUNTER — Other Ambulatory Visit: Payer: Self-pay | Admitting: Internal Medicine

## 2018-01-11 ENCOUNTER — Encounter: Payer: Self-pay | Admitting: Internal Medicine

## 2018-01-14 ENCOUNTER — Ambulatory Visit: Payer: 59 | Admitting: Internal Medicine

## 2018-01-14 ENCOUNTER — Ambulatory Visit: Payer: 59 | Admitting: Family Medicine

## 2018-02-13 ENCOUNTER — Encounter: Payer: Self-pay | Admitting: Internal Medicine

## 2018-02-13 ENCOUNTER — Other Ambulatory Visit
Admission: RE | Admit: 2018-02-13 | Discharge: 2018-02-13 | Disposition: A | Discharge status: Home / Self Care | Payer: 59 | Source: Ambulatory Visit | Attending: Internal Medicine | Admitting: Internal Medicine

## 2018-02-13 ENCOUNTER — Ambulatory Visit (INDEPENDENT_AMBULATORY_CARE_PROVIDER_SITE_OTHER): Payer: 59 | Admitting: Internal Medicine

## 2018-02-13 VITALS — BP 144/82 | HR 76 | Ht 64.0 in | Wt 289.0 lb

## 2018-02-13 DIAGNOSIS — R7303 Prediabetes: Secondary | ICD-10-CM

## 2018-02-13 DIAGNOSIS — Z1231 Encounter for screening mammogram for malignant neoplasm of breast: Secondary | ICD-10-CM

## 2018-02-13 DIAGNOSIS — J4520 Mild intermittent asthma, uncomplicated: Secondary | ICD-10-CM | POA: Diagnosis not present

## 2018-02-13 DIAGNOSIS — Z1211 Encounter for screening for malignant neoplasm of colon: Secondary | ICD-10-CM

## 2018-02-13 DIAGNOSIS — Z1239 Encounter for other screening for malignant neoplasm of breast: Secondary | ICD-10-CM

## 2018-02-13 DIAGNOSIS — K580 Irritable bowel syndrome with diarrhea: Secondary | ICD-10-CM

## 2018-02-13 DIAGNOSIS — E538 Deficiency of other specified B group vitamins: Secondary | ICD-10-CM | POA: Diagnosis not present

## 2018-02-13 DIAGNOSIS — Z9989 Dependence on other enabling machines and devices: Secondary | ICD-10-CM

## 2018-02-13 DIAGNOSIS — E559 Vitamin D deficiency, unspecified: Secondary | ICD-10-CM | POA: Diagnosis not present

## 2018-02-13 DIAGNOSIS — Z0000 Encounter for general adult medical examination without abnormal findings: Secondary | ICD-10-CM | POA: Insufficient documentation

## 2018-02-13 DIAGNOSIS — G2581 Restless legs syndrome: Secondary | ICD-10-CM | POA: Diagnosis not present

## 2018-02-13 DIAGNOSIS — G4733 Obstructive sleep apnea (adult) (pediatric): Secondary | ICD-10-CM

## 2018-02-13 DIAGNOSIS — I10 Essential (primary) hypertension: Secondary | ICD-10-CM

## 2018-02-13 DIAGNOSIS — J4550 Severe persistent asthma, uncomplicated: Secondary | ICD-10-CM | POA: Insufficient documentation

## 2018-02-13 LAB — LIPID PANEL
Cholesterol: 152 mg/dL (ref 0–200)
HDL: 56 mg/dL (ref >40)
LDL Cholesterol: 86 mg/dL (ref 0–99)
Total CHOL/HDL Ratio: 2.7 RATIO
Triglycerides: 50 mg/dL (ref <150)
VLDL: 10 mg/dL (ref 0–40)

## 2018-02-13 LAB — CBC WITH DIFFERENTIAL/PLATELET
BASOS ABS: 0.1 10*3/uL (ref 0–0.1)
Basophils Relative: 1 %
Eosinophils Absolute: 0.3 10*3/uL (ref 0–0.7)
Eosinophils Relative: 5 %
HCT: 35.8 % (ref 35.0–47.0)
HEMOGLOBIN: 11.7 g/dL — AB (ref 12.0–16.0)
LYMPHS PCT: 28 %
Lymphs Abs: 2 10*3/uL (ref 1.0–3.6)
MCH: 26.6 pg (ref 26.0–34.0)
MCHC: 32.7 g/dL (ref 32.0–36.0)
MCV: 81.2 fL (ref 80.0–100.0)
Monocytes Absolute: 0.5 10*3/uL (ref 0.2–0.9)
Monocytes Relative: 7 %
NEUTROS PCT: 59 %
Neutro Abs: 4.4 10*3/uL (ref 1.4–6.5)
Platelets: 265 10*3/uL (ref 150–440)
RBC: 4.41 MIL/uL (ref 3.80–5.20)
RDW: 14.9 % — ABNORMAL HIGH (ref 11.5–14.5)
WBC: 7.4 10*3/uL (ref 3.6–11.0)

## 2018-02-13 LAB — COMPREHENSIVE METABOLIC PANEL
ALK PHOS: 65 U/L (ref 38–126)
ALT: 14 U/L (ref 0–44)
AST: 17 U/L (ref 15–41)
Albumin: 3.7 g/dL (ref 3.5–5.0)
Anion gap: 8 (ref 5–15)
BUN: 16 mg/dL (ref 8–23)
CO2: 27 mmol/L (ref 22–32)
CREATININE: 0.94 mg/dL (ref 0.44–1.00)
Calcium: 9.4 mg/dL (ref 8.9–10.3)
Chloride: 105 mmol/L (ref 98–111)
GFR calc non Af Amer: >60 mL/min (ref >60)
Glucose, Bld: 102 mg/dL — ABNORMAL HIGH (ref 70–99)
Potassium: 3.7 mmol/L (ref 3.5–5.1)
SODIUM: 140 mmol/L (ref 135–145)
TOTAL PROTEIN: 7.3 g/dL (ref 6.5–8.1)
Total Bilirubin: 0.7 mg/dL (ref 0.3–1.2)

## 2018-02-13 LAB — POCT URINALYSIS DIPSTICK
Bilirubin, UA: NEGATIVE
GLUCOSE UA: NEGATIVE
Ketones, UA: NEGATIVE
LEUKOCYTES UA: NEGATIVE
Nitrite, UA: NEGATIVE
Protein, UA: NEGATIVE
Spec Grav, UA: 1.015 (ref 1.010–1.025)
Urobilinogen, UA: 0.2 E.U./dL
pH, UA: 6 (ref 5.0–8.0)

## 2018-02-13 LAB — HEMOGLOBIN A1C
HEMOGLOBIN A1C: 5.8 % — AB (ref 4.8–5.6)
MEAN PLASMA GLUCOSE: 119.76 mg/dL

## 2018-02-13 LAB — VITAMIN B12: VITAMIN B 12: 471 pg/mL (ref 180–914)

## 2018-02-13 LAB — TSH: TSH: 2.178 u[IU]/mL (ref 0.350–4.500)

## 2018-02-13 MED ORDER — LISINOPRIL 10 MG PO TABS: 10.0000 mg | ORAL_TABLET | Freq: Every day | ORAL | 0 refills | Status: DC

## 2018-02-13 MED ORDER — ARMODAFINIL 200 MG PO TABS: 200.0000 mg | ORAL_TABLET | Freq: Every day | ORAL | 0 refills | Status: DC | PRN

## 2018-02-13 MED ORDER — ROPINIROLE HCL 1 MG PO TABS: 1.0000 mg | ORAL_TABLET | Freq: Every day | ORAL | 3 refills | Status: DC

## 2018-02-13 NOTE — Progress Notes (Signed)
Date:  02/13/2018   Name:  Samantha Mason   DOB:  February 06, 1951   MRN:  [id number]   Chief Complaint: Annual Exam (Breast Exam.) Samantha Mason is a 67 y.o. female who presents today for her Complete Annual Exam. She feels fairly well. She reports exercising little due to knee and foot pain. She reports she is sleeping fairly well.  She is due for mammogram and CRC screening.  Diabetes  She presents for her follow-up diabetic visit. Diabetes type: prediabetes. Pertinent negatives for hypoglycemia include no dizziness, headaches, nervousness/anxiousness or tremors. Pertinent negatives for diabetes include no chest pain, no fatigue, no polydipsia and no polyuria.  Asthma  She complains of chest tightness and wheezing. There is no cough or shortness of breath. This is a recurrent problem. The problem occurs intermittently. The problem has been unchanged. Pertinent negatives include no chest pain, fever, headaches or trouble swallowing. Her past medical history is significant for asthma.  Gastroesophageal Reflux  She complains of wheezing. She reports no abdominal pain, no chest pain or no coughing. This is a chronic problem. Pertinent negatives include no fatigue. She has tried a histamine-2 antagonist for the symptoms.  IBS with diarrhea - had a colonoscopy many years ago. Sx are stable on questran since gall bladder surgery. RLS - on ropinrole with good control of symptoms. OSA - on CPAP, no study in over 10 years.  More sleepy during the day.  Had to drive to [place] and almost fell asleep driving,  She has used nuvigil in the past and would like another Rx.  BP - was on medications in the past but stopped them when BP seemed better.  She does not monitor BP at home.  Review of chart shows most of her reading are greater that 962 systolic.  She relates a history of small stroke many years ago and is afraid of having another.  Review of Systems  Constitutional: Negative for chills, fatigue  and fever.  HENT: Negative for congestion, hearing loss, tinnitus, trouble swallowing and voice change.   Eyes: Negative for visual disturbance.  Respiratory: Positive for wheezing. Negative for cough, chest tightness and shortness of breath.   Cardiovascular: Negative for chest pain, palpitations and leg swelling.  Gastrointestinal: Negative for abdominal pain, constipation, diarrhea and vomiting.  Endocrine: Negative for polydipsia and polyuria.  Genitourinary: Negative for dysuria, frequency, genital sores, vaginal bleeding and vaginal discharge.  Musculoskeletal: Negative for arthralgias, gait problem and joint swelling.  Skin: Negative for color change and rash.  Neurological: Negative for dizziness, tremors, light-headedness and headaches.  Hematological: Negative for adenopathy. Does not bruise/bleed easily.  Psychiatric/Behavioral: Negative for dysphoric mood and sleep disturbance. The patient is not nervous/anxious.     Patient Active Problem List   Diagnosis Date Noted  . Mild intermittent asthma without complication 83/66/2947  . Vitamin D deficiency 03/17/2017  . Prediabetes 03/14/2017  . Microcytic anemia 03/14/2017  . B12 deficiency 03/14/2017  . IBS (irritable bowel syndrome) 03/12/2017  . Allergic rhinitis 03/12/2017  . Restless leg syndrome 03/12/2017  . Acid reflux 10/16/2016  . OSA on CPAP 12/08/2014  . Obesity, morbid ([place]) 05/31/2014    Allergies  Allergen Reactions  . Penicillins Diarrhea and Nausea And Vomiting    Past Surgical History:  Procedure Laterality Date  . CHOLECYSTECTOMY    . saliva gland removal  1998  . TOTAL KNEE ARTHROPLASTY Left 2014  . TUBAL LIGATION Bilateral     Social History   Tobacco  Use  . Smoking status: Never Smoker  . Smokeless tobacco: Never Used  Substance Use Topics  . Alcohol use: No    Alcohol/week: 0.0 standard drinks  . Drug use: No     Medication list has been reviewed and updated.  Current Meds    Medication Sig  . acetaminophen (TYLENOL) 500 MG tablet Take 2 tablets (1,000 mg total) by mouth every 8 (eight) hours as needed for moderate pain.  Marland Kitchen albuterol (ACCUNEB) 1.25 MG/3ML nebulizer solution Take 1 ampule by nebulization every 6 (six) hours as needed for wheezing.  . budesonide-formoterol (SYMBICORT) 160-4.5 MCG/ACT inhaler Inhale 2 puffs into the lungs 2 (two) times daily as needed.  . cetirizine (ZYRTEC) 10 MG tablet Take 1 tablet (10 mg total) by mouth daily.  . cholestyramine (QUESTRAN) 4 GM/DOSE powder Take 1 packet (4 g total) by mouth daily.  . diphenoxylate-atropine (LOMOTIL) 2.5-0.025 MG tablet Take 1 tablet by mouth 3 (three) times daily as needed for diarrhea or loose stools.  . NON FORMULARY CPAP at night  . ranitidine (ZANTAC) 150 MG capsule Take 150 mg by mouth daily as needed.  Marland Kitchen rOPINIRole (REQUIP) 1 MG tablet Take 1 tablet (1 mg total) by mouth at bedtime.  . [DISCONTINUED] rOPINIRole (REQUIP) 1 MG tablet Take 1 tablet (1 mg total) by mouth at bedtime.    PHQ 2/9 Scores 02/13/2018 03/12/2017 10/16/2016 12/07/2014  PHQ - 2 Score 1 0 6 0  PHQ- 9 Score - - 12 -    Physical Exam  Constitutional: She is oriented to person, place, and time. She appears well-developed and well-nourished. No distress.  HENT:  Head: Normocephalic and atraumatic.  Right Ear: Tympanic membrane and ear canal normal.  Left Ear: Tympanic membrane and ear canal normal.  Nose: Right sinus exhibits no maxillary sinus tenderness. Left sinus exhibits no maxillary sinus tenderness.  Mouth/Throat: Uvula is midline and oropharynx is clear and moist.  Eyes: Conjunctivae and EOM are normal. Right eye exhibits no discharge. Left eye exhibits no discharge. No scleral icterus.  Neck: Normal range of motion. Carotid bruit is not present. No erythema present. No thyromegaly present.  Cardiovascular: Normal rate, regular rhythm, normal heart sounds and normal pulses.  Pulmonary/Chest: Effort normal. No  respiratory distress. She has no wheezes. Right breast exhibits no mass, no nipple discharge, no skin change and no tenderness. Left breast exhibits no mass, no nipple discharge, no skin change and no tenderness.  Abdominal: Soft. Bowel sounds are normal. There is no hepatosplenomegaly. There is no tenderness. There is no CVA tenderness.  Musculoskeletal: She exhibits no edema or tenderness.       Right knee: She exhibits decreased range of motion.       Left knee: She exhibits decreased range of motion.  Lymphadenopathy:    She has no cervical adenopathy.    She has no axillary adenopathy.  Neurological: She is alert and oriented to person, place, and time. She has normal reflexes. No cranial nerve deficit or sensory deficit. Gait (antalgic) abnormal.  Skin: Skin is warm, dry and intact. No rash noted.  Psychiatric: She has a normal mood and affect. Her speech is normal and behavior is normal. Thought content normal.  Nursing note and vitals reviewed.   BP (!) 144/82 (BP Location: Right Arm, Patient Position: Sitting, Cuff Size: Large)   Pulse 76   Ht 5\' 4"  (1.626 m)   Wt 289 lb (131.1 kg)   SpO2 97%   BMI 49.61 kg/m  Assessment and Plan: 1. Annual physical exam - Lipid panel - POCT urinalysis dipstick  2. Breast cancer screening - MM 3D SCREEN BREAST BILATERAL; Future  3. Colon cancer screening Submit Cologuard in the next 2 weeks  4. Mild intermittent asthma without complication Continue current inhalers  5. Restless leg syndrome Doing well on requip nightly - CBC with Differential/Platelet - rOPINIRole (REQUIP) 1 MG tablet; Take 1 tablet (1 mg total) by mouth at bedtime.  Dispense: 90 tablet; Refill: 3  6. Essential hypertension Begin medications Recheck in 6 weeks - lisinopril (PRINIVIL,ZESTRIL) 10 MG tablet; Take 1 tablet (10 mg total) by mouth daily.  Dispense: 90 tablet; Refill: 0  7. Prediabetes Check labs - Comprehensive metabolic panel - Hemoglobin A1c -  TSH  8. B12 deficiency - Vitamin B12  9. Vitamin D deficiency - VITAMIN D 25 Hydroxy (Vit-D Deficiency, Fractures)  10. OSA on CPAP Likely needs repeat study - discuss next visit Will give a few Nuvigil  11. Irritable bowel syndrome with diarrhea Continue current management   Partially dictated using Editor, commissioning. Any errors are unintentional.  Halina Maidens, MD [facility]  02/13/2018

## 2018-02-14 LAB — VITAMIN D 25 HYDROXY (VIT D DEFICIENCY, FRACTURES): Vit D, 25-Hydroxy: 38.1 ng/mL (ref 30.0–100.0)

## 2018-03-03 DIAGNOSIS — Z1211 Encounter for screening for malignant neoplasm of colon: Secondary | ICD-10-CM | POA: Diagnosis not present

## 2018-03-03 DIAGNOSIS — Z1212 Encounter for screening for malignant neoplasm of rectum: Secondary | ICD-10-CM | POA: Diagnosis not present

## 2018-03-03 LAB — COLOGUARD: Cologuard: POSITIVE

## 2018-03-13 ENCOUNTER — Telehealth: Payer: Self-pay | Admitting: Internal Medicine

## 2018-03-13 ENCOUNTER — Encounter: Payer: Self-pay | Admitting: Internal Medicine

## 2018-03-13 ENCOUNTER — Other Ambulatory Visit: Payer: Self-pay

## 2018-03-13 DIAGNOSIS — R195 Other fecal abnormalities: Secondary | ICD-10-CM

## 2018-03-13 NOTE — Telephone Encounter (Signed)
I am entering referral to [facility] for Positive Cologaurd patient aware.

## 2018-03-13 NOTE — Telephone Encounter (Signed)
Please let pt know that Cologuard test was positive. This means that she has a 90% of having either a pre-cancerous polyp or colon cancer and she should have a colonoscopy. Let me know where she wants to go and I will place the referral.

## 2018-03-13 NOTE — Telephone Encounter (Signed)
Please review note and inform patient

## 2018-03-16 ENCOUNTER — Other Ambulatory Visit: Payer: Self-pay

## 2018-03-16 DIAGNOSIS — R195 Other fecal abnormalities: Secondary | ICD-10-CM

## 2018-03-19 ENCOUNTER — Other Ambulatory Visit: Payer: Self-pay | Admitting: Family Medicine

## 2018-03-27 ENCOUNTER — Ambulatory Visit: Payer: 59 | Admitting: Internal Medicine

## 2018-03-27 ENCOUNTER — Encounter: Payer: Self-pay | Admitting: Internal Medicine

## 2018-03-27 VITALS — BP 128/78 | HR 74 | Resp 16 | Ht 64.0 in | Wt 283.0 lb

## 2018-03-27 DIAGNOSIS — K219 Gastro-esophageal reflux disease without esophagitis: Secondary | ICD-10-CM

## 2018-03-27 DIAGNOSIS — I10 Essential (primary) hypertension: Secondary | ICD-10-CM | POA: Diagnosis not present

## 2018-03-27 DIAGNOSIS — R195 Other fecal abnormalities: Secondary | ICD-10-CM

## 2018-03-27 DIAGNOSIS — M546 Pain in thoracic spine: Secondary | ICD-10-CM | POA: Diagnosis not present

## 2018-03-27 MED ORDER — PANTOPRAZOLE SODIUM 40 MG PO TBEC: 40.0000 mg | DELAYED_RELEASE_TABLET | Freq: Every day | ORAL | 1 refills | Status: DC

## 2018-03-27 NOTE — Progress Notes (Signed)
Date:  03/27/2018   Name:  Samantha Mason   DOB:  10-25-1950   MRN:  [id number]   Chief Complaint: Obesity; Foot Pain (right heel pain-? spur ); and Back Pain (Right Lumbar region x 2 months feels like spasms )  Hypertension  This is a new problem. The problem has been gradually improving since onset. The problem is controlled. Pertinent negatives include no chest pain, headaches, palpitations or shortness of breath. Risk factors for coronary artery disease include diabetes mellitus and dyslipidemia. Past treatments include ACE inhibitors.  Gastroesophageal Reflux  She complains of heartburn. She reports no chest pain, no coughing or no wheezing. This is a recurrent problem. The problem has been unchanged. The heartburn is located in the substernum. The heartburn is of moderate intensity. Associated symptoms include fatigue. She has tried a PPI for the symptoms. The treatment provided moderate relief.   Positive Cologuard test - pt is now scheduled for a colonoscopy next month. She also has mammogram scheduled for next week.  Back ache - she has pain on her right lateral mid back for the past few months.  No known injury.  Taking tylenol.  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    Review of Systems  Constitutional: Positive for fatigue. Negative for chills and fever.  Respiratory: Negative for cough, chest tightness, shortness of breath and wheezing.   Cardiovascular: Negative for chest pain and palpitations.  Gastrointestinal: Positive for heartburn.  Musculoskeletal: Positive for arthralgias, gait problem and myalgias (in right mid flank area).  Neurological: Negative for dizziness and headaches.    Patient Active Problem List   Diagnosis Date Noted  . Positive colorectal cancer screening using Cologuard test 03/13/2018  . Mild intermittent asthma without complication 50/01/3817  . Vitamin D deficiency 03/17/2017  . Prediabetes  03/14/2017  . Microcytic anemia 03/14/2017  . B12 deficiency 03/14/2017  . IBS (irritable bowel syndrome) 03/12/2017  . Allergic rhinitis 03/12/2017  . Restless leg syndrome 03/12/2017  . Acid reflux 10/16/2016  . Essential hypertension 10/16/2016  . OSA on CPAP 12/08/2014  . Obesity, morbid ([place]) 05/31/2014    Allergies  Allergen Reactions  . Penicillins Diarrhea and Nausea And Vomiting    Past Surgical History:  Procedure Laterality Date  . CHOLECYSTECTOMY    . saliva gland removal  1998  . TOTAL KNEE ARTHROPLASTY Left 2014  . TUBAL LIGATION Bilateral     Social History   Tobacco Use  . Smoking status: Never Smoker  . Smokeless tobacco: Never Used  Substance Use Topics  . Alcohol use: No    Alcohol/week: 0.0 standard drinks  . Drug use: No     Medication list has been reviewed and updated.  Current Meds  Medication Sig  . acetaminophen (TYLENOL) 500 MG tablet Take 2 tablets (1,000 mg total) by mouth every 8 (eight) hours as needed for moderate pain.  Marland Kitchen albuterol (ACCUNEB) 1.25 MG/3ML nebulizer solution Take 1 ampule by nebulization every 6 (six) hours as needed for wheezing.  . Armodafinil 200 MG TABS Take 200 mg by mouth daily as needed.  . budesonide-formoterol (SYMBICORT) 160-4.5 MCG/ACT inhaler Inhale 2 puffs into the lungs 2 (two) times daily as needed.  . cetirizine (ZYRTEC) 10 MG tablet Take 1 tablet (10 mg total) by mouth daily.  . cholestyramine (QUESTRAN) 4 GM/DOSE powder MIX 4GM AND DRINK BY MOUTH DAILY.  . diphenoxylate-atropine (LOMOTIL) 2.5-0.025 MG tablet Take 1 tablet by mouth  3 (three) times daily as needed for diarrhea or loose stools.  Marland Kitchen lisinopril (PRINIVIL,ZESTRIL) 10 MG tablet Take 1 tablet (10 mg total) by mouth daily.  . NON FORMULARY CPAP at night  . pantoprazole (PROTONIX) 40 MG tablet Take 40 mg by mouth daily.  Marland Kitchen rOPINIRole (REQUIP) 1 MG tablet Take 1 tablet (1 mg total) by mouth at bedtime.    PHQ 2/9 Scores 02/13/2018 03/12/2017  10/16/2016 12/07/2014  PHQ - 2 Score 1 0 6 0  PHQ- 9 Score - - 12 -    Physical Exam  Constitutional: She is oriented to person, place, and time. She appears well-developed. No distress.  HENT:  Head: Normocephalic and atraumatic.  Neck: Normal range of motion. Neck supple.  Cardiovascular: Normal rate, regular rhythm and normal heart sounds.  Pulmonary/Chest: Effort normal and breath sounds normal. No respiratory distress.  Abdominal: Soft.  Musculoskeletal: She exhibits no edema.  Lymphadenopathy:    She has no cervical adenopathy.  Neurological: She is alert and oriented to person, place, and time.  Skin: Skin is warm and dry. No rash noted.  Psychiatric: She has a normal mood and affect. Her behavior is normal. Thought content normal.  Nursing note and vitals reviewed.   BP 128/78   Pulse 74   Resp 16   Ht 5\' 4"  (1.626 m)   Wt 283 lb (128.4 kg)   SpO2 95%   BMI 48.58 kg/m   Assessment and Plan: 1. Essential hypertension Controlled Continue current dose of lisinopril  2. Positive colorectal cancer screening using Cologuard test Colonoscopy scheduled  3. Gastroesophageal reflux disease, esophagitis presence not specified Resume PPI as needed - pantoprazole (PROTONIX) 40 MG tablet; Take 1 tablet (40 mg total) by mouth daily.  Dispense: 90 tablet; Refill: 1  4. Acute right-sided thoracic back pain Likely muscular - continue tylenol and add heat  Partially dictated using Editor, commissioning. Any errors are unintentional.  Halina Maidens, MD [facility]  03/27/2018

## 2018-03-30 ENCOUNTER — Ambulatory Visit
Admission: RE | Admit: 2018-03-30 | Discharge: 2018-03-30 | Disposition: A | Discharge status: Home / Self Care | Payer: 59 | Source: Ambulatory Visit | Attending: Internal Medicine | Admitting: Internal Medicine

## 2018-03-30 ENCOUNTER — Encounter: Payer: Self-pay | Admitting: *Deleted

## 2018-03-30 DIAGNOSIS — Z1239 Encounter for other screening for malignant neoplasm of breast: Secondary | ICD-10-CM

## 2018-03-30 DIAGNOSIS — Z1231 Encounter for screening mammogram for malignant neoplasm of breast: Secondary | ICD-10-CM | POA: Diagnosis not present

## 2018-03-31 ENCOUNTER — Ambulatory Visit: Payer: 59 | Admitting: Certified Registered"

## 2018-03-31 ENCOUNTER — Encounter
Admission: RE | Disposition: A | Discharge status: Home / Self Care | Payer: Self-pay | Source: Ambulatory Visit | Attending: Gastroenterology

## 2018-03-31 ENCOUNTER — Ambulatory Visit
Admission: RE | Admit: 2018-03-31 | Discharge: 2018-03-31 | Disposition: A | Discharge status: Home / Self Care | Payer: 59 | Source: Ambulatory Visit | Attending: Gastroenterology | Admitting: Gastroenterology

## 2018-03-31 ENCOUNTER — Encounter: Payer: Self-pay | Admitting: *Deleted

## 2018-03-31 DIAGNOSIS — G2581 Restless legs syndrome: Secondary | ICD-10-CM | POA: Insufficient documentation

## 2018-03-31 DIAGNOSIS — Z8249 Family history of ischemic heart disease and other diseases of the circulatory system: Secondary | ICD-10-CM | POA: Insufficient documentation

## 2018-03-31 DIAGNOSIS — F329 Major depressive disorder, single episode, unspecified: Secondary | ICD-10-CM | POA: Insufficient documentation

## 2018-03-31 DIAGNOSIS — Z6841 Body Mass Index (BMI) 40.0 and over, adult: Secondary | ICD-10-CM | POA: Diagnosis not present

## 2018-03-31 DIAGNOSIS — K589 Irritable bowel syndrome without diarrhea: Secondary | ICD-10-CM | POA: Diagnosis not present

## 2018-03-31 DIAGNOSIS — R195 Other fecal abnormalities: Secondary | ICD-10-CM | POA: Insufficient documentation

## 2018-03-31 DIAGNOSIS — Z79899 Other long term (current) drug therapy: Secondary | ICD-10-CM | POA: Insufficient documentation

## 2018-03-31 DIAGNOSIS — Z96652 Presence of left artificial knee joint: Secondary | ICD-10-CM | POA: Insufficient documentation

## 2018-03-31 DIAGNOSIS — G4733 Obstructive sleep apnea (adult) (pediatric): Secondary | ICD-10-CM | POA: Insufficient documentation

## 2018-03-31 DIAGNOSIS — J4520 Mild intermittent asthma, uncomplicated: Secondary | ICD-10-CM | POA: Diagnosis not present

## 2018-03-31 DIAGNOSIS — K640 First degree hemorrhoids: Secondary | ICD-10-CM | POA: Diagnosis not present

## 2018-03-31 DIAGNOSIS — D122 Benign neoplasm of ascending colon: Secondary | ICD-10-CM

## 2018-03-31 DIAGNOSIS — F418 Other specified anxiety disorders: Secondary | ICD-10-CM | POA: Diagnosis not present

## 2018-03-31 DIAGNOSIS — I10 Essential (primary) hypertension: Secondary | ICD-10-CM | POA: Diagnosis not present

## 2018-03-31 DIAGNOSIS — Z8673 Personal history of transient ischemic attack (TIA), and cerebral infarction without residual deficits: Secondary | ICD-10-CM | POA: Insufficient documentation

## 2018-03-31 DIAGNOSIS — J45909 Unspecified asthma, uncomplicated: Secondary | ICD-10-CM | POA: Diagnosis not present

## 2018-03-31 DIAGNOSIS — Z880 Allergy status to penicillin: Secondary | ICD-10-CM | POA: Insufficient documentation

## 2018-03-31 DIAGNOSIS — Z7951 Long term (current) use of inhaled steroids: Secondary | ICD-10-CM | POA: Insufficient documentation

## 2018-03-31 DIAGNOSIS — K635 Polyp of colon: Secondary | ICD-10-CM | POA: Diagnosis not present

## 2018-03-31 DIAGNOSIS — E669 Obesity, unspecified: Secondary | ICD-10-CM | POA: Insufficient documentation

## 2018-03-31 HISTORY — PX: COLONOSCOPY WITH PROPOFOL: SHX5780

## 2018-03-31 SURGERY — COLONOSCOPY WITH PROPOFOL
Anesthesia: General

## 2018-03-31 MED ORDER — PROPOFOL 500 MG/50ML IV EMUL
INTRAVENOUS | Status: DC | PRN
  Administered 2018-03-31: 100 ug/kg/min via INTRAVENOUS

## 2018-03-31 MED ORDER — LIDOCAINE HCL (CARDIAC) PF 100 MG/5ML IV SOSY
PREFILLED_SYRINGE | INTRAVENOUS | Status: DC | PRN
  Administered 2018-03-31: 80 mg via INTRAVENOUS

## 2018-03-31 MED ORDER — SODIUM CHLORIDE 0.9 % IV SOLN
INTRAVENOUS | Status: DC
  Administered 2018-03-31: 1000 mL via INTRAVENOUS

## 2018-03-31 MED ORDER — PROPOFOL 10 MG/ML IV BOLUS
INTRAVENOUS | Status: AC
  Filled 2018-03-31: qty 20

## 2018-03-31 MED ORDER — PROPOFOL 10 MG/ML IV BOLUS
INTRAVENOUS | Status: DC | PRN
  Administered 2018-03-31: 70 mg via INTRAVENOUS

## 2018-03-31 NOTE — Anesthesia Preprocedure Evaluation (Signed)
Anesthesia Evaluation  Patient identified by MRN, date of birth, ID band Patient awake    Reviewed: Allergy & Precautions, NPO status , Patient's Chart, lab work & pertinent test results  History of Anesthesia Complications Negative for: history of anesthetic complications  Airway Mallampati: III  TM Distance: >3 FB Neck ROM: Full    Dental  (+) Poor Dentition, Chipped   Pulmonary asthma , sleep apnea ,    breath sounds clear to auscultation- rhonchi (-) wheezing      Cardiovascular hypertension, Pt. on medications (-) CAD, (-) Past MI, (-) Cardiac Stents and (-) CABG  Rhythm:Regular Rate:Normal - Systolic murmurs and - Diastolic murmurs    Neuro/Psych PSYCHIATRIC DISORDERS Anxiety Depression CVA, No Residual Symptoms    GI/Hepatic Neg liver ROS, GERD  ,  Endo/Other  negative endocrine ROSneg diabetes  Renal/GU negative Renal ROS     Musculoskeletal negative musculoskeletal ROS (+)   Abdominal (+) + obese,   Peds  Hematology  (+) anemia ,   Anesthesia Other Findings Past Medical History: No date: Acute bronchitis No date: Anxiety and depression No date: Asthma 07/16/2017: Family history of blood clots No date: Irritable bowel syndrome No date: Malignant hypertension No date: Obesity No date: OSA (obstructive sleep apnea) No date: Restless leg syndrome No date: Stroke [facility])   Reproductive/Obstetrics                             Anesthesia Physical Anesthesia Plan  ASA: III  Anesthesia Plan: General   Post-op Pain Management:    Induction: Intravenous  PONV Risk Score and Plan: 2 and Propofol infusion  Airway Management Planned: Natural Airway  Additional Equipment:   Intra-op Plan:   Post-operative Plan:   Informed Consent: I have reviewed the patients History and Physical, chart, labs and discussed the procedure including the risks, benefits and alternatives for the  proposed anesthesia with the patient or authorized representative who has indicated his/her understanding and acceptance.   Dental advisory given  Plan Discussed with: CRNA and Anesthesiologist  Anesthesia Plan Comments:         Anesthesia Quick Evaluation

## 2018-03-31 NOTE — Anesthesia Postprocedure Evaluation (Signed)
Anesthesia Post Note  Patient: Samantha Mason  Procedure(s) Performed: COLONOSCOPY WITH PROPOFOL (N/A )  Patient location during evaluation: Endoscopy Anesthesia Type: General Level of consciousness: awake and alert and oriented Pain management: pain level controlled Vital Signs Assessment: post-procedure vital signs reviewed and stable Respiratory status: spontaneous breathing, nonlabored ventilation and respiratory function stable Cardiovascular status: blood pressure returned to baseline and stable Postop Assessment: no signs of nausea or vomiting Anesthetic complications: no     Last Vitals:  Vitals:   03/31/18 1008 03/31/18 1018  BP: 136/60 (!) 164/83  Pulse: 77 78  Resp: 18 (!) 21  Temp:    SpO2: 100% 100%    Last Pain:  Vitals:   03/31/18 1018  TempSrc:   PainSc: 0-No pain                 Larri Brewton

## 2018-03-31 NOTE — Anesthesia Post-op Follow-up Note (Signed)
Anesthesia QCDR form completed.        

## 2018-03-31 NOTE — H&P (Signed)
Lucilla Lame, MD [facility] [address] [place], [place] [address] Phone:[phone] Fax : [phone]  Primary Care Physician:  Glean Hess, MD Primary Gastroenterologist:  Dr. Allen Norris  Pre-Procedure History & Physical: HPI:  Samantha Mason is a 67 y.o. female is here for an colonoscopy.   Past Medical History:  Diagnosis Date  . Acute bronchitis   . Anxiety and depression   . Asthma   . Family history of blood clots 07/16/2017  . Irritable bowel syndrome   . Malignant hypertension   . Obesity   . OSA (obstructive sleep apnea)   . Restless leg syndrome   . Stroke [facility])     Past Surgical History:  Procedure Laterality Date  . CHOLECYSTECTOMY    . JOINT REPLACEMENT  2014  . saliva gland removal  1998  . TOTAL KNEE ARTHROPLASTY Left 2014  . TUBAL LIGATION Bilateral     Prior to Admission medications   Medication Sig Start Date End Date Taking? Authorizing Provider  acetaminophen (TYLENOL) 500 MG tablet Take 2 tablets (1,000 mg total) by mouth every 8 (eight) hours as needed for moderate pain. 04/09/17  Yes Plonk, [name], MD  Armodafinil 200 MG TABS Take 200 mg by mouth daily as needed. 02/13/18  Yes Glean Hess, MD  cetirizine (ZYRTEC) 10 MG tablet Take 1 tablet (10 mg total) by mouth daily. 12/07/14  Yes Ashok Norris, MD  cholestyramine Lucrezia Starch) 4 GM/DOSE powder MIX 4GM AND DRINK BY MOUTH DAILY. 03/20/18  Yes Glean Hess, MD  diphenoxylate-atropine (LOMOTIL) 2.5-0.025 MG tablet Take 1 tablet by mouth 3 (three) times daily as needed for diarrhea or loose stools. 10/16/16  Yes Rochel Brome A, MD  lisinopril (PRINIVIL,ZESTRIL) 10 MG tablet Take 1 tablet (10 mg total) by mouth daily. 02/13/18  Yes Glean Hess, MD  NON FORMULARY CPAP at night   Yes [provider]  pantoprazole (PROTONIX) 40 MG tablet Take 1 tablet (40 mg total) by mouth daily. 03/27/18  Yes Glean Hess, MD  rOPINIRole (REQUIP) 1 MG tablet Take 1 tablet (1 mg  total) by mouth at bedtime. 02/13/18  Yes Glean Hess, MD  albuterol (ACCUNEB) 1.25 MG/3ML nebulizer solution Take 1 ampule by nebulization every 6 (six) hours as needed for wheezing.    [provider]  budesonide-formoterol (SYMBICORT) 160-4.5 MCG/ACT inhaler Inhale 2 puffs into the lungs 2 (two) times daily as needed.    [provider]    Allergies as of 03/16/2018 - Review Complete 02/13/2018  Allergen Reaction Noted  . Penicillins Diarrhea and Nausea And Vomiting 03/29/2014    Family History  Problem Relation Age of Onset  . Dementia Mother   . COPD Father        51  . Diabetes Father   . Heart failure Brother        2010    Social History   Socioeconomic History  . Marital status: Widowed    Spouse name: Not on file  . Number of children: 2  . Years of education: Not on file  . Highest education level: Not on file  Occupational History  . Not on file  Social Needs  . Financial resource strain: Not on file  . Food insecurity:    Worry: Not on file    Inability: Not on file  . Transportation needs:    Medical: Not on file    Non-medical: Not on file  Tobacco Use  . Smoking status:  Never Smoker  . Smokeless tobacco: Never Used  Substance and Sexual Activity  . Alcohol use: No    Alcohol/week: 0.0 standard drinks  . Drug use: No  . Sexual activity: Never  Lifestyle  . Physical activity:    Days per week: Not on file    Minutes per session: Not on file  . Stress: Not on file  Relationships  . Social connections:    Talks on phone: Not on file    Gets together: Not on file    Attends religious service: Not on file    Active member of club or organization: Not on file    Attends meetings of clubs or organizations: Not on file    Relationship status: Not on file  . Intimate partner violence:    Fear of current or ex partner: Not on file    Emotionally abused: Not on file    Physically abused: Not on file    Forced sexual activity:  Not on file  Other Topics Concern  . Not on file  Social History Narrative  . Not on file    Review of Systems: See HPI, otherwise negative ROS  Physical Exam: BP (!) 174/71   Pulse 75   Temp 97.7 F (36.5 C) (Tympanic)   Resp 18   Ht 5\' 4"  (1.626 m)   Wt 127.9 kg   SpO2 96%   BMI 48.41 kg/m  General:   Alert,  pleasant and cooperative in NAD Head:  Normocephalic and atraumatic. Neck:  Supple; no masses or thyromegaly. Lungs:  Clear throughout to auscultation.    Heart:  Regular rate and rhythm. Abdomen:  Soft, nontender and nondistended. Normal bowel sounds, without guarding, and without rebound.   Neurologic:  Alert and  oriented x4;  grossly normal neurologically.  Impression/Plan: Samantha Mason is here for an colonoscopy to be performed for positive cologard  Risks, benefits, limitations, and alternatives regarding  colonoscopy have been reviewed with the patient.  Questions have been answered.  All parties agreeable.   Lucilla Lame, MD  03/31/2018, 9:29 AM

## 2018-03-31 NOTE — Op Note (Signed)
[facility] Gastroenterology Patient Name: Samantha Mason Procedure Date: 03/31/2018 9:36 AM MRN: [id number] Account #: [id number] Date of Birth: [birth year] Admit Type: Outpatient Age: 67 Room: [facility] ENDO ROOM 4 Gender: Female Note Status: Finalized Procedure:            Colonoscopy Indications:          Positive Cologuard test Providers:            Lucilla Lame MD, MD Referring MD:         Halina Maidens, MD (Referring MD) Medicines:            Propofol per Anesthesia Complications:        No immediate complications. Procedure:            Pre-Anesthesia Assessment:                       - Prior to the procedure, a History and Physical was                        performed, and patient medications and allergies were                        reviewed. The patient's tolerance of previous                        anesthesia was also reviewed. The risks and benefits of                        the procedure and the sedation options and risks were                        discussed with the patient. All questions were                        answered, and informed consent was obtained. Prior                        Anticoagulants: The patient has taken no previous                        anticoagulant or antiplatelet agents. ASA Grade                        Assessment: II - A patient with mild systemic disease.                        After reviewing the risks and benefits, the patient was                        deemed in satisfactory condition to undergo the                        procedure.                       After obtaining informed consent, the colonoscope was                        passed under direct vision. Throughout the procedure,  the patient's blood pressure, pulse, and oxygen                        saturations were monitored continuously. The                        Colonoscope was introduced through the anus and                        advanced  to the the cecum, identified by appendiceal                        orifice and ileocecal valve. The colonoscopy was                        performed without difficulty. The patient tolerated the                        procedure well. The quality of the bowel preparation                        was excellent. Findings:      The perianal and digital rectal examinations were normal.      A 3 mm polyp was found in the ascending colon. The polyp was sessile.       The polyp was removed with a cold biopsy forceps. Resection and       retrieval were complete.      Non-bleeding internal hemorrhoids were found during retroflexion. The       hemorrhoids were Grade I (internal hemorrhoids that do not prolapse). Impression:           - One 3 mm polyp in the ascending colon, removed with a                        cold biopsy forceps. Resected and retrieved.                       - Non-bleeding internal hemorrhoids. Recommendation:       - Discharge patient to home.                       - Resume previous diet.                       - Continue present medications.                       - Await pathology results.                       - Repeat colonoscopy in 5 years if polyp adenoma and 10                        years if hyperplastic Procedure Code(s):    --- Professional ---                       [phone], Colonoscopy, flexible; with biopsy, single or                        multiple Diagnosis Code(s):    --- Professional ---  R19.5, Other fecal abnormalities                       D12.2, Benign neoplasm of ascending colon CPT copyright 2018 American Medical Association. All rights reserved. The codes documented in this report are preliminary and upon coder review may  be revised to meet current compliance requirements. Lucilla Lame MD, MD 03/31/2018 9:55:08 AM This report has been signed electronically. Number of Addenda: 0 Note Initiated On: 03/31/2018 9:36 AM Scope Withdrawal  Time: 0 hours 6 minutes 22 seconds  Total Procedure Duration: 0 hours 12 minutes 48 seconds       [facility]

## 2018-03-31 NOTE — Transfer of Care (Signed)
Immediate Anesthesia Transfer of Care Note  Patient: Samantha Mason  Procedure(s) Performed: COLONOSCOPY WITH PROPOFOL (N/A )  Patient Location: PACU  Anesthesia Type:General  Level of Consciousness: awake, alert  and oriented  Airway & Oxygen Therapy: Patient Spontanous Breathing and Patient connected to nasal cannula oxygen  Post-op Assessment: Report given to RN and Post -op Vital signs reviewed and stable  Post vital signs: Reviewed and stable  Last Vitals:  Vitals Value Taken Time  BP 118/53 03/31/2018  9:58 AM  Temp 36.4 C 03/31/2018  9:58 AM  Pulse 81 03/31/2018  9:58 AM  Resp 17 03/31/2018  9:58 AM  SpO2 98 % 03/31/2018  9:58 AM  Vitals shown include unvalidated device data.  Last Pain:  Vitals:   03/31/18 0958  TempSrc: Tympanic  PainSc: Asleep         Complications: No apparent anesthesia complications

## 2018-04-01 ENCOUNTER — Encounter: Payer: Self-pay | Admitting: Gastroenterology

## 2018-04-03 LAB — SURGICAL PATHOLOGY

## 2018-04-04 ENCOUNTER — Encounter: Payer: Self-pay | Admitting: Gastroenterology

## 2018-05-26 ENCOUNTER — Other Ambulatory Visit: Payer: Self-pay | Admitting: Internal Medicine

## 2018-05-26 DIAGNOSIS — I10 Essential (primary) hypertension: Secondary | ICD-10-CM

## 2018-07-01 ENCOUNTER — Other Ambulatory Visit: Payer: Self-pay

## 2018-07-01 ENCOUNTER — Encounter: Payer: Self-pay | Admitting: Emergency Medicine

## 2018-07-01 ENCOUNTER — Emergency Department: Payer: 59

## 2018-07-01 ENCOUNTER — Observation Stay
Admission: EM | Admit: 2018-07-01 | Discharge: 2018-07-02 | Disposition: A | Discharge status: Home / Self Care | Payer: 59 | Attending: Specialist | Admitting: Specialist

## 2018-07-01 DIAGNOSIS — Z6841 Body Mass Index (BMI) 40.0 and over, adult: Secondary | ICD-10-CM | POA: Diagnosis not present

## 2018-07-01 DIAGNOSIS — Z79899 Other long term (current) drug therapy: Secondary | ICD-10-CM | POA: Insufficient documentation

## 2018-07-01 DIAGNOSIS — R079 Chest pain, unspecified: Secondary | ICD-10-CM | POA: Diagnosis present

## 2018-07-01 DIAGNOSIS — G4733 Obstructive sleep apnea (adult) (pediatric): Secondary | ICD-10-CM | POA: Diagnosis not present

## 2018-07-01 DIAGNOSIS — N179 Acute kidney failure, unspecified: Secondary | ICD-10-CM | POA: Diagnosis not present

## 2018-07-01 DIAGNOSIS — F329 Major depressive disorder, single episode, unspecified: Secondary | ICD-10-CM | POA: Diagnosis not present

## 2018-07-01 DIAGNOSIS — K219 Gastro-esophageal reflux disease without esophagitis: Secondary | ICD-10-CM | POA: Insufficient documentation

## 2018-07-01 DIAGNOSIS — R7989 Other specified abnormal findings of blood chemistry: Secondary | ICD-10-CM | POA: Diagnosis not present

## 2018-07-01 DIAGNOSIS — R42 Dizziness and giddiness: Secondary | ICD-10-CM | POA: Diagnosis not present

## 2018-07-01 DIAGNOSIS — G2581 Restless legs syndrome: Secondary | ICD-10-CM | POA: Insufficient documentation

## 2018-07-01 DIAGNOSIS — R519 Headache, unspecified: Secondary | ICD-10-CM

## 2018-07-01 DIAGNOSIS — R0602 Shortness of breath: Secondary | ICD-10-CM | POA: Diagnosis not present

## 2018-07-01 DIAGNOSIS — R0789 Other chest pain: Secondary | ICD-10-CM | POA: Diagnosis not present

## 2018-07-01 DIAGNOSIS — R51 Headache: Principal | ICD-10-CM | POA: Insufficient documentation

## 2018-07-01 DIAGNOSIS — Z8673 Personal history of transient ischemic attack (TIA), and cerebral infarction without residual deficits: Secondary | ICD-10-CM | POA: Insufficient documentation

## 2018-07-01 DIAGNOSIS — I10 Essential (primary) hypertension: Secondary | ICD-10-CM | POA: Insufficient documentation

## 2018-07-01 DIAGNOSIS — F419 Anxiety disorder, unspecified: Secondary | ICD-10-CM | POA: Diagnosis not present

## 2018-07-01 LAB — BASIC METABOLIC PANEL
Anion gap: 7 (ref 5–15)
BUN: 22 mg/dL (ref 8–23)
CALCIUM: 9.5 mg/dL (ref 8.9–10.3)
CO2: 26 mmol/L (ref 22–32)
Chloride: 104 mmol/L (ref 98–111)
Creatinine, Ser: 1.19 mg/dL — ABNORMAL HIGH (ref 0.44–1.00)
GFR calc Af Amer: 55 mL/min — ABNORMAL LOW (ref >60)
GFR, EST NON AFRICAN AMERICAN: 48 mL/min — AB (ref >60)
GLUCOSE: 132 mg/dL — AB (ref 70–99)
Potassium: 3.9 mmol/L (ref 3.5–5.1)
Sodium: 137 mmol/L (ref 135–145)

## 2018-07-01 LAB — CBC
HEMATOCRIT: 37.7 % (ref 36.0–46.0)
Hemoglobin: 11.9 g/dL — ABNORMAL LOW (ref 12.0–15.0)
MCH: 26.4 pg (ref 26.0–34.0)
MCHC: 31.6 g/dL (ref 30.0–36.0)
MCV: 83.6 fL (ref 80.0–100.0)
Platelets: 313 10*3/uL (ref 150–400)
RBC: 4.51 MIL/uL (ref 3.87–5.11)
RDW: 13.9 % (ref 11.5–15.5)
WBC: 10.1 10*3/uL (ref 4.0–10.5)
nRBC: 0 % (ref 0.0–0.2)

## 2018-07-01 LAB — TROPONIN I
Troponin I: <0.03 ng/mL (ref <0.03)
Troponin I: <0.03 ng/mL (ref <0.03)

## 2018-07-01 LAB — FIBRIN DERIVATIVES D-DIMER (ARMC ONLY): FIBRIN DERIVATIVES D-DIMER (ARMC): 751.21 ng{FEU}/mL — AB (ref 0.00–499.00)

## 2018-07-01 MED ORDER — SODIUM CHLORIDE 0.9 % IV BOLUS
1000.0000 mL | Freq: Once | INTRAVENOUS | Status: AC
  Administered 2018-07-01: 1000 mL via INTRAVENOUS

## 2018-07-01 MED ORDER — ONDANSETRON HCL 4 MG/2ML IJ SOLN
4.0000 mg | Freq: Once | INTRAMUSCULAR | Status: AC
  Administered 2018-07-01: 4 mg via INTRAVENOUS
  Filled 2018-07-01: qty 2

## 2018-07-01 NOTE — ED Notes (Signed)
Patient is resting comfortably. Blood sent to the lab.

## 2018-07-01 NOTE — ED Notes (Signed)
Patient complaining of nausea. IV zofran given per order. Will continue to monitor.

## 2018-07-01 NOTE — ED Provider Notes (Signed)
[facility] Emergency Department Provider Note  ___________________________________________   First MD Initiated Contact with Patient 07/01/18 2040     (approximate)  I have reviewed the triage vital signs and the nursing notes.   HISTORY  Chief Complaint Chest Pain   HPI Samantha Mason is a 68 y.o. female with a history of anxiety as well as malignant hypertension and stroke was presented emergency department with chest pressure as well as back pain that started approximately 3 PM this afternoon.  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  States that the chest pressure as well as back pain have improved and are only minimal at this time.  No exacerbating factors such as movement, deep breathing or cough or exertion.  States that her blood pressure was in the 150s earlier today and that she has had similar symptoms in the past and says that she was diagnosed with stroke at that time.  However, at that time said that she was having left-sided facial numbness which she denies this time.  Denies any focal weakness as well.  Says has been eating and drinking normally lately.  No medication changes.  No nausea vomiting or diarrhea.  States that she is having 9 out of 10, throbbing headache to the right occipital region which has been slowly increasing over the course of the day.  Not reporting any neck pain.  No fever or body aches.   Past Medical History:  Diagnosis Date  . Acute bronchitis   . Anxiety and depression   . Asthma   . Family history of blood clots 07/16/2017  . Irritable bowel syndrome   . Malignant hypertension   . Obesity   . OSA (obstructive sleep apnea)   . Restless leg syndrome   . Stroke [facility])     Patient Active Problem List   Diagnosis Date Noted  . Benign neoplasm of ascending colon   . Positive colorectal cancer screening using Cologuard test 03/13/2018  . Mild intermittent asthma without  complication 58/01/9832  . Vitamin D deficiency 03/17/2017  . Prediabetes 03/14/2017  . Microcytic anemia 03/14/2017  . B12 deficiency 03/14/2017  . IBS (irritable bowel syndrome) 03/12/2017  . Allergic rhinitis 03/12/2017  . Restless leg syndrome 03/12/2017  . Acid reflux 10/16/2016  . Essential hypertension 10/16/2016  . OSA on CPAP 12/08/2014  . Obesity, morbid ([place]) 05/31/2014    Past Surgical History:  Procedure Laterality Date  . CHOLECYSTECTOMY    . COLONOSCOPY WITH PROPOFOL N/A 03/31/2018   Procedure: COLONOSCOPY WITH PROPOFOL;  Surgeon: Lucilla Lame, MD;  Location: [facility] ENDOSCOPY;  Service: Endoscopy;  Laterality: N/A;  . JOINT REPLACEMENT  2014  . saliva gland removal  1998  . TOTAL KNEE ARTHROPLASTY Left 2014  . TUBAL LIGATION Bilateral     Prior to Admission medications   Medication Sig Start Date End Date Taking? Authorizing Provider  acetaminophen (TYLENOL) 500 MG tablet Take 2 tablets (1,000 mg total) by mouth every 8 (eight) hours as needed for moderate pain. 04/09/17   Plonk, [name], MD  albuterol (ACCUNEB) 1.25 MG/3ML nebulizer solution Take 1 ampule by nebulization every 6 (six) hours as needed for wheezing.    [provider]  Armodafinil 200 MG TABS Take 200 mg by mouth daily as needed. 02/13/18   Glean Hess, MD  budesonide-formoterol [facility]) 160-4.5 MCG/ACT inhaler Inhale 2 puffs into the lungs 2 (two) times daily as needed.  [provider]  cetirizine (ZYRTEC) 10 MG tablet Take 1 tablet (10 mg total) by mouth daily. 12/07/14   Ashok Norris, MD  cholestyramine Lucrezia Starch) 4 GM/DOSE powder MIX 4GM AND DRINK BY MOUTH DAILY. 03/20/18   Glean Hess, MD  diphenoxylate-atropine (LOMOTIL) 2.5-0.025 MG tablet Take 1 tablet by mouth 3 (three) times daily as needed for diarrhea or loose stools. 10/16/16   Roselee Nova, MD  lisinopril (PRINIVIL,ZESTRIL) 10 MG tablet TAKE 1 TABLET (10 MG TOTAL) BY MOUTH DAILY. 05/26/18   Glean Hess, MD  NON FORMULARY CPAP at night    [provider]  pantoprazole (PROTONIX) 40 MG tablet Take 1 tablet (40 mg total) by mouth daily. 03/27/18   Glean Hess, MD  rOPINIRole (REQUIP) 1 MG tablet Take 1 tablet (1 mg total) by mouth at bedtime. 02/13/18   Glean Hess, MD    Allergies Penicillins  Family History  Problem Relation Age of Onset  . Dementia Mother   . COPD Father        58  . Diabetes Father   . Heart failure Brother        2010    Social History Social History   Tobacco Use  . Smoking status: Never Smoker  . Smokeless tobacco: Never Used  Substance Use Topics  . Alcohol use: No    Alcohol/week: 0.0 standard drinks  . Drug use: No    Review of Systems  Constitutional: No fever/chills Eyes: No visual changes. ENT: No sore throat. Cardiovascular: As above Respiratory: Denies shortness of breath. Gastrointestinal: No abdominal pain.  No nausea, no vomiting.  No diarrhea.  No constipation. Genitourinary: Negative for dysuria. Musculoskeletal: As above Skin: Negative for rash. Neurological: Negative for  focal weakness or numbness.   ____________________________________________   PHYSICAL EXAM:  VITAL SIGNS: ED Triage Vitals  Enc Vitals Group     BP 07/01/18 1922 (!) 157/67     Pulse Rate 07/01/18 1922 97     Resp 07/01/18 1922 18     Temp 07/01/18 1922 98.6 F (37 C)     Temp Source 07/01/18 1922 Oral     SpO2 07/01/18 1922 97 %     Weight 07/01/18 1918 287 lb (130.2 kg)     Height 07/01/18 1918 5\' 4"  (1.626 m)     Head Circumference --      Peak Flow --      Pain Score 07/01/18 1918 8     Pain Loc --      Pain Edu? --      Excl. in [place]? --     Constitutional: Alert and oriented. Well appearing and in no acute distress. Eyes: Conjunctivae are normal.  Head: Atraumatic. Nose: No congestion/rhinnorhea. Mouth/Throat: Mucous membranes are moist.  Neck: No stridor.   Cardiovascular: Normal rate, regular rhythm.  Grossly normal heart sounds.  Good peripheral circulation with equal and bilateral radial as well as dorsalis pedis pulses.  Chest pain is not reproducible to palpation. Respiratory: Normal respiratory effort.  No retractions. Lungs CTAB. Gastrointestinal: Soft and nontender. No distention. No CVA tenderness. Musculoskeletal: No lower extremity tenderness nor edema.  No joint effusions.  No reproducible back pain upon palpation. Neurologic:  Normal speech and language. No gross focal neurologic deficits are appreciated. Skin:  Skin is warm, dry and intact. No rash noted. Psychiatric: Mood and affect are normal. Speech and behavior are normal.  ____________________________________________   LABS (all labs ordered are listed,  but only abnormal results are displayed)  Labs Reviewed  BASIC METABOLIC PANEL - Abnormal; Notable for the following components:      Result Value   Glucose, Bld 132 (*)    Creatinine, Ser 1.19 (*)    GFR calc non Af Amer 48 (*)    GFR calc Af Amer 55 (*)    All other components within normal limits  CBC - Abnormal; Notable for the following components:   Hemoglobin 11.9 (*)    All other components within normal limits  TROPONIN I  TROPONIN I  FIBRIN DERIVATIVES D-DIMER ([facility] ONLY)   ____________________________________________  EKG  ED ECG REPORT I, Doran Stabler, the attending physician, personally viewed and interpreted this ECG.   Date: 07/01/2018  EKG Time: 1920  Rate: 98  Rhythm: normal sinus rhythm  Axis: Normal  Intervals:none  ST&T Change: No ST segment elevation or depression.  No abnormal T wave inversion.  ____________________________________________  RADIOLOGY  No active cardiopulmonary disease on chest x-ray.  CT head without acute pathology. ____________________________________________   PROCEDURES  Procedure(s) performed:   Procedures  Critical Care performed:   ____________________________________________   INITIAL  IMPRESSION / ASSESSMENT AND PLAN / ED COURSE  Pertinent labs & imaging results that were available during my care of the patient were reviewed by me and considered in my medical decision making (see chart for details).  Differential diagnosis includes, but is not limited to, ACS, aortic dissection, pulmonary embolism, cardiac tamponade, pneumothorax, pneumonia, pericarditis, myocarditis, GI-related causes including esophagitis/gastritis, and musculoskeletal chest wall pain.   As part of my medical decision making, I reviewed the following data within the electronic MEDICAL RECORD NUMBER Notes from prior ED visits  ----------------------------------------- 11:23 PM on 07/01/2018 -----------------------------------------  Patient says that after fluids that her headache has resolved and her back pain is resolved but now she is having a recurrence of her chest pressure.  Reassuring EKG, 2- troponins.  I asked about PE risk factors and she says that her mother died from multiple PEs.  Will send d-dimer.  Also discussed admission versus discharge and the patient would prefer to be discharged and follow-up with cardiology providing she has a negative d-dimer.  She is understanding of the suspected diagnosis as well as the treatment and willing to comply.  Signed up to Dr. Owens Shark.  Unlikely to be aortic dissection.  Back pain has resolved.  Pulses equal and bilateral to the radial as well as dorsalis pedis pulses.  Normal mediastinum on the chest x-ray. ____________________________________________   FINAL CLINICAL IMPRESSION(S) / ED DIAGNOSES  Chest pain.  Headache.  NEW MEDICATIONS STARTED DURING THIS VISIT:  New Prescriptions   No medications on file     Note:  This document was prepared using Dragon voice recognition software and may include unintentional dictation errors.     Orbie Pyo, MD 07/01/18 [phone]

## 2018-07-01 NOTE — ED Notes (Signed)
Patient transported to CT 

## 2018-07-01 NOTE — ED Triage Notes (Signed)
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  Denies n/v/d.  States SOB.  Pt speaking in complete and coherent sentences, chest rise even and unlabored, skin warm and dry and in NAD at this time.

## 2018-07-02 ENCOUNTER — Observation Stay (HOSPITAL_BASED_OUTPATIENT_CLINIC_OR_DEPARTMENT_OTHER): Payer: 59

## 2018-07-02 ENCOUNTER — Encounter: Payer: Self-pay | Admitting: Radiology

## 2018-07-02 ENCOUNTER — Emergency Department: Payer: 59

## 2018-07-02 DIAGNOSIS — F419 Anxiety disorder, unspecified: Secondary | ICD-10-CM | POA: Diagnosis not present

## 2018-07-02 DIAGNOSIS — R079 Chest pain, unspecified: Secondary | ICD-10-CM

## 2018-07-02 DIAGNOSIS — R0789 Other chest pain: Secondary | ICD-10-CM | POA: Diagnosis not present

## 2018-07-02 DIAGNOSIS — R7989 Other specified abnormal findings of blood chemistry: Secondary | ICD-10-CM | POA: Diagnosis not present

## 2018-07-02 DIAGNOSIS — I10 Essential (primary) hypertension: Secondary | ICD-10-CM | POA: Diagnosis not present

## 2018-07-02 DIAGNOSIS — R0602 Shortness of breath: Secondary | ICD-10-CM | POA: Diagnosis not present

## 2018-07-02 DIAGNOSIS — N179 Acute kidney failure, unspecified: Secondary | ICD-10-CM | POA: Diagnosis not present

## 2018-07-02 DIAGNOSIS — F329 Major depressive disorder, single episode, unspecified: Secondary | ICD-10-CM | POA: Diagnosis not present

## 2018-07-02 DIAGNOSIS — G4733 Obstructive sleep apnea (adult) (pediatric): Secondary | ICD-10-CM

## 2018-07-02 DIAGNOSIS — R51 Headache: Principal | ICD-10-CM

## 2018-07-02 DIAGNOSIS — J45909 Unspecified asthma, uncomplicated: Secondary | ICD-10-CM | POA: Diagnosis not present

## 2018-07-02 DIAGNOSIS — G2581 Restless legs syndrome: Secondary | ICD-10-CM | POA: Diagnosis not present

## 2018-07-02 HISTORY — DX: Chest pain, unspecified: R07.9

## 2018-07-02 LAB — NM MYOCAR MULTI W/SPECT W/WALL MOTION / EF
CHL CUP NUCLEAR SDS: 0
CSEPEW: 1 METS
Exercise duration (min): 0 min
Exercise duration (sec): 0 s
LV dias vol: 64 mL (ref 46–106)
LV sys vol: 24 mL
MPHR: 154 {beats}/min
NUC STRESS TID: 1.24
Peak HR: 97 {beats}/min
Percent HR: 62 %
Rest HR: 77 {beats}/min
SRS: 0
SSS: 1

## 2018-07-02 LAB — CBC
HCT: 35.5 % — ABNORMAL LOW (ref 36.0–46.0)
Hemoglobin: 10.9 g/dL — ABNORMAL LOW (ref 12.0–15.0)
MCH: 26.3 pg (ref 26.0–34.0)
MCHC: 30.7 g/dL (ref 30.0–36.0)
MCV: 85.5 fL (ref 80.0–100.0)
Platelets: 297 10*3/uL (ref 150–400)
RBC: 4.15 MIL/uL (ref 3.87–5.11)
RDW: 14.1 % (ref 11.5–15.5)
WBC: 10.1 10*3/uL (ref 4.0–10.5)
nRBC: 0 % (ref 0.0–0.2)

## 2018-07-02 LAB — CREATININE, SERUM
Creatinine, Ser: 1.09 mg/dL — ABNORMAL HIGH (ref 0.44–1.00)
GFR calc Af Amer: >60 mL/min (ref >60)
GFR calc non Af Amer: 53 mL/min — ABNORMAL LOW (ref >60)

## 2018-07-02 LAB — TROPONIN I: Troponin I: <0.03 ng/mL (ref <0.03)

## 2018-07-02 MED ORDER — PANTOPRAZOLE SODIUM 40 MG PO TBEC
40.0000 mg | DELAYED_RELEASE_TABLET | Freq: Every day | ORAL | Status: DC
  Administered 2018-07-02: 40 mg via ORAL
  Filled 2018-07-02: qty 1

## 2018-07-02 MED ORDER — CHOLESTYRAMINE LIGHT 4 G PO PACK
4.0000 g | PACK | Freq: Every day | ORAL | Status: DC
  Filled 2018-07-02: qty 1

## 2018-07-02 MED ORDER — ALBUTEROL SULFATE (2.5 MG/3ML) 0.083% IN NEBU: 1.5000 mL | INHALATION_SOLUTION | Freq: Four times a day (QID) | RESPIRATORY_TRACT | Status: DC | PRN

## 2018-07-02 MED ORDER — TECHNETIUM TC 99M TETROFOSMIN IV KIT
11.6300 | PACK | Freq: Once | INTRAVENOUS | Status: AC | PRN
  Administered 2018-07-02: 11.63 via INTRAVENOUS

## 2018-07-02 MED ORDER — ENOXAPARIN SODIUM 40 MG/0.4ML ~~LOC~~ SOLN: 40.0000 mg | Freq: Two times a day (BID) | SUBCUTANEOUS | Status: DC

## 2018-07-02 MED ORDER — MORPHINE SULFATE (PF) 2 MG/ML IV SOLN
INTRAVENOUS | Status: AC
  Filled 2018-07-02: qty 1

## 2018-07-02 MED ORDER — METOPROLOL TARTRATE 25 MG PO TABS
25.0000 mg | ORAL_TABLET | Freq: Two times a day (BID) | ORAL | Status: DC
  Filled 2018-07-02: qty 1

## 2018-07-02 MED ORDER — IOHEXOL 350 MG/ML SOLN
75.0000 mL | Freq: Once | INTRAVENOUS | Status: AC | PRN
  Administered 2018-07-02: 75 mL via INTRAVENOUS

## 2018-07-02 MED ORDER — SODIUM CHLORIDE 0.9% FLUSH: 3.0000 mL | INTRAVENOUS | Status: DC | PRN

## 2018-07-02 MED ORDER — ASPIRIN 81 MG PO CHEW
CHEWABLE_TABLET | ORAL | Status: AC
  Filled 2018-07-02: qty 4

## 2018-07-02 MED ORDER — ASPIRIN EC 325 MG PO TBEC
325.0000 mg | DELAYED_RELEASE_TABLET | Freq: Every day | ORAL | Status: DC
  Administered 2018-07-02: 325 mg via ORAL
  Filled 2018-07-02: qty 1

## 2018-07-02 MED ORDER — LISINOPRIL 10 MG PO TABS
10.0000 mg | ORAL_TABLET | Freq: Every day | ORAL | Status: DC
  Administered 2018-07-02: 10 mg via ORAL
  Filled 2018-07-02: qty 1

## 2018-07-02 MED ORDER — ONDANSETRON HCL 4 MG/2ML IJ SOLN: 4.0000 mg | Freq: Four times a day (QID) | INTRAMUSCULAR | Status: DC | PRN

## 2018-07-02 MED ORDER — ENOXAPARIN SODIUM 40 MG/0.4ML ~~LOC~~ SOLN
40.0000 mg | Freq: Two times a day (BID) | SUBCUTANEOUS | Status: DC
  Administered 2018-07-02: 40 mg via SUBCUTANEOUS
  Filled 2018-07-02 (×2): qty 0.4

## 2018-07-02 MED ORDER — MORPHINE SULFATE (PF) 2 MG/ML IV SOLN: 2.0000 mg | INTRAVENOUS | Status: DC | PRN

## 2018-07-02 MED ORDER — CHOLESTYRAMINE LIGHT 4 G PO PACK
4.0000 g | PACK | Freq: Once | ORAL | Status: AC
  Administered 2018-07-02: 4 g via ORAL
  Filled 2018-07-02: qty 1

## 2018-07-02 MED ORDER — ASPIRIN 81 MG PO CHEW
324.0000 mg | CHEWABLE_TABLET | Freq: Once | ORAL | Status: AC
  Administered 2018-07-02: 324 mg via ORAL

## 2018-07-02 MED ORDER — ROPINIROLE HCL 1 MG PO TABS: 2.0000 mg | ORAL_TABLET | Freq: Every day | ORAL | Status: DC

## 2018-07-02 MED ORDER — MOMETASONE FURO-FORMOTEROL FUM 200-5 MCG/ACT IN AERO
2.0000 | INHALATION_SPRAY | Freq: Two times a day (BID) | RESPIRATORY_TRACT | Status: DC
  Administered 2018-07-02: 2 via RESPIRATORY_TRACT
  Filled 2018-07-02: qty 8.8

## 2018-07-02 MED ORDER — LORATADINE 10 MG PO TABS
10.0000 mg | ORAL_TABLET | Freq: Every day | ORAL | Status: DC
  Administered 2018-07-02: 10 mg via ORAL
  Filled 2018-07-02: qty 1

## 2018-07-02 MED ORDER — HYOSCYAMINE SULFATE 0.125 MG SL SUBL
0.2500 mg | SUBLINGUAL_TABLET | Freq: Once | SUBLINGUAL | Status: DC
  Filled 2018-07-02: qty 2

## 2018-07-02 MED ORDER — SODIUM CHLORIDE 0.9% FLUSH
3.0000 mL | Freq: Two times a day (BID) | INTRAVENOUS | Status: DC
  Administered 2018-07-02: 3 mL via INTRAVENOUS

## 2018-07-02 MED ORDER — ALUM & MAG HYDROXIDE-SIMETH 200-200-20 MG/5ML PO SUSP: 30.0000 mL | Freq: Once | ORAL | Status: DC

## 2018-07-02 MED ORDER — HYDRALAZINE HCL 20 MG/ML IJ SOLN: 10.0000 mg | INTRAMUSCULAR | Status: DC | PRN

## 2018-07-02 MED ORDER — REGADENOSON 0.4 MG/5ML IV SOLN
0.4000 mg | Freq: Once | INTRAVENOUS | Status: AC
  Administered 2018-07-02: 0.4 mg via INTRAVENOUS

## 2018-07-02 MED ORDER — ACETAMINOPHEN 500 MG PO TABS: 1000.0000 mg | ORAL_TABLET | Freq: Three times a day (TID) | ORAL | Status: DC | PRN

## 2018-07-02 MED ORDER — ALPRAZOLAM 0.25 MG PO TABS: 0.2500 mg | ORAL_TABLET | Freq: Two times a day (BID) | ORAL | Status: DC | PRN

## 2018-07-02 MED ORDER — NITROGLYCERIN 0.4 MG SL SUBL: 0.4000 mg | SUBLINGUAL_TABLET | SUBLINGUAL | Status: DC | PRN

## 2018-07-02 MED ORDER — ACETAMINOPHEN 325 MG PO TABS: 650.0000 mg | ORAL_TABLET | ORAL | Status: DC | PRN

## 2018-07-02 MED ORDER — TECHNETIUM TC 99M TETROFOSMIN IV KIT
32.1030 | PACK | Freq: Once | INTRAVENOUS | Status: AC | PRN
  Administered 2018-07-02: 32.103 via INTRAVENOUS

## 2018-07-02 MED ORDER — MORPHINE SULFATE (PF) 2 MG/ML IV SOLN
2.0000 mg | Freq: Once | INTRAVENOUS | Status: AC
  Administered 2018-07-02: 2 mg via INTRAVENOUS

## 2018-07-02 MED ORDER — DIPHENOXYLATE-ATROPINE 2.5-0.025 MG PO TABS
1.0000 | ORAL_TABLET | Freq: Three times a day (TID) | ORAL | Status: DC | PRN
  Filled 2018-07-02: qty 1

## 2018-07-02 NOTE — H&P (Signed)
[facility] NAME: Samantha Mason    MR#:  [id number]  DATE OF BIRTH:  Nov 23, 1950  DATE OF ADMISSION:  07/01/2018  PRIMARY CARE PHYSICIAN: Glean Hess, MD   REQUESTING/REFERRING PHYSICIAN:   CHIEF COMPLAINT:   Chief Complaint  Patient presents with  . Chest Pain    HISTORY OF PRESENT ILLNESS: Samantha Mason  is a 68 y.o. female with a known history per below presented to the emergency room with acute chest pressure with associated back pain that started around 3 PM today associated with shortness of breath, lightheadedness, dizziness, ill feeling, left facial numbness, mild headache, not associated with exertion, ER work-up was unimpressive, patient evaluated in the emergency room, no apparent distress, resting comfortably in bed, patient stated that her blood pressure was in the 150s to 846K range systolically earlier today, patient is now being admitted for acute chest pain secondary to unknown etiology.  PAST MEDICAL HISTORY:   Past Medical History:  Diagnosis Date  . Acute bronchitis   . Anxiety and depression   . Asthma   . Family history of blood clots 07/16/2017  . Irritable bowel syndrome   . Malignant hypertension   . Obesity   . OSA (obstructive sleep apnea)   . Restless leg syndrome   . Stroke [facility])     PAST SURGICAL HISTORY:  Past Surgical History:  Procedure Laterality Date  . CHOLECYSTECTOMY    . COLONOSCOPY WITH PROPOFOL N/A 03/31/2018   Procedure: COLONOSCOPY WITH PROPOFOL;  Surgeon: Lucilla Lame, MD;  Location: [facility] ENDOSCOPY;  Service: Endoscopy;  Laterality: N/A;  . JOINT REPLACEMENT  2014  . saliva gland removal  1998  . TOTAL KNEE ARTHROPLASTY Left 2014  . TUBAL LIGATION Bilateral     SOCIAL HISTORY:  Social History   Tobacco Use  . Smoking status: Never Smoker  . Smokeless tobacco: Never Used  Substance Use Topics  . Alcohol use: No    Alcohol/week: 0.0 standard drinks    FAMILY  HISTORY:  Family History  Problem Relation Age of Onset  . Dementia Mother   . COPD Father        14  . Diabetes Father   . Heart failure Brother        2010    DRUG ALLERGIES:  Allergies  Allergen Reactions  . Penicillins Diarrhea and Nausea And Vomiting    REVIEW OF SYSTEMS:   CONSTITUTIONAL: No fever, fatigue or weakness.  EYES: No blurred or double vision.  EARS, NOSE, AND THROAT: No tinnitus or ear pain.  RESPIRATORY: No cough, shortness of breath, wheezing or hemoptysis.  CARDIOVASCULAR: +chest pain,no orthopnea, edema.  GASTROINTESTINAL: No nausea, vomiting, diarrhea or abdominal pain.  GENITOURINARY: No dysuria, hematuria.  ENDOCRINE: No polyuria, nocturia,  HEMATOLOGY: No anemia, easy bruising or bleeding SKIN: No rash or lesion. MUSCULOSKELETAL: No joint pain or arthritis.   NEUROLOGIC: No tingling, numbness, weakness.  PSYCHIATRY: No anxiety or depression.   MEDICATIONS AT HOME:  Prior to Admission medications   Medication Sig Start Date End Date Taking? Authorizing Provider  acetaminophen (TYLENOL) 500 MG tablet Take 2 tablets (1,000 mg total) by mouth every 8 (eight) hours as needed for moderate pain. 04/09/17  Yes Plonk, [name], MD  albuterol (ACCUNEB) 1.25 MG/3ML nebulizer solution Take 1 ampule by nebulization every 6 (six) hours as needed for wheezing.   Yes [provider]  budesonide-formoterol (SYMBICORT) 160-4.5 MCG/ACT inhaler Inhale 2 puffs into the lungs 2 (  two) times daily as needed.   Yes [provider]  cetirizine (ZYRTEC) 10 MG tablet Take 1 tablet (10 mg total) by mouth daily. 12/07/14  Yes Ashok Norris, MD  cholestyramine Lucrezia Starch) 4 GM/DOSE powder MIX 4GM AND DRINK BY MOUTH DAILY. 03/20/18  Yes Glean Hess, MD  diphenoxylate-atropine (LOMOTIL) 2.5-0.025 MG tablet Take 1 tablet by mouth 3 (three) times daily as needed for diarrhea or loose stools. 10/16/16  Yes Keith Rake Asad A, MD  lisinopril (PRINIVIL,ZESTRIL) 10  MG tablet TAKE 1 TABLET (10 MG TOTAL) BY MOUTH DAILY. 05/26/18  Yes Glean Hess, MD  NON FORMULARY CPAP at night   Yes [provider]  pantoprazole (PROTONIX) 40 MG tablet Take 1 tablet (40 mg total) by mouth daily. 03/27/18  Yes Glean Hess, MD  rOPINIRole (REQUIP) 1 MG tablet Take 1 tablet (1 mg total) by mouth at bedtime. Patient taking differently: Take 2 mg by mouth at bedtime.  02/13/18  Yes Glean Hess, MD      PHYSICAL EXAMINATION:   VITAL SIGNS: Blood pressure 132/60, pulse 84, temperature 98.6 F (37 C), temperature source Oral, resp. rate 18, height 5\' 4"  (1.626 m), weight 130.2 kg, SpO2 96 %.  GENERAL:  68 y.o.-year-old patient lying in the bed with no acute distress.  Extreme morbid obesity, nontoxic-appearing EYES: Pupils equal, round, reactive to light and accommodation. No scleral icterus. Extraocular muscles intact.  HEENT: Head atraumatic, normocephalic. Oropharynx and nasopharynx clear.  NECK:  Supple, no jugular venous distention. No thyroid enlargement, no tenderness.  LUNGS: Normal breath sounds bilaterally, no wheezing, rales,rhonchi or crepitation. No use of accessory muscles of respiration.  CARDIOVASCULAR: S1, S2 normal. No murmurs, rubs, or gallops.  ABDOMEN: Soft, nontender, nondistended. Bowel sounds present. No organomegaly or mass.  EXTREMITIES: No pedal edema, cyanosis, or clubbing.  NEUROLOGIC: Cranial nerves II through XII are intact. Muscle strength 5/5 in all extremities. Sensation intact. Gait not checked.  PSYCHIATRIC: The patient is alert and oriented x 3.  SKIN: No obvious rash, lesion, or ulcer.   LABORATORY PANEL:   CBC Recent Labs  Lab 07/01/18 1922  WBC 10.1  HGB 11.9*  HCT 37.7  PLT 313  MCV 83.6  MCH 26.4  MCHC 31.6  RDW 13.9   ------------------------------------------------------------------------------------------------------------------  Chemistries  Recent Labs  Lab 07/01/18 1922  NA 137  K 3.9   CL 104  CO2 26  GLUCOSE 132*  BUN 22  CREATININE 1.19*  CALCIUM 9.5   ------------------------------------------------------------------------------------------------------------------ estimated creatinine clearance is 62.3 mL/min (A) (by C-G formula based on SCr of 1.19 mg/dL (H)). ------------------------------------------------------------------------------------------------------------------ No results for input(s): TSH, T4TOTAL, T3FREE, THYROIDAB in the last 72 hours.  Invalid input(s): FREET3   Coagulation profile No results for input(s): INR, PROTIME in the last 168 hours. ------------------------------------------------------------------------------------------------------------------- No results for input(s): DDIMER in the last 72 hours. -------------------------------------------------------------------------------------------------------------------  Cardiac Enzymes Recent Labs  Lab 07/01/18 1922 07/01/18 2213  TROPONINI <0.03 <0.03   ------------------------------------------------------------------------------------------------------------------ Invalid input(s): POCBNP  ---------------------------------------------------------------------------------------------------------------  Urinalysis    Component Value Date/Time   COLORURINE Yellow 04/27/2012 0958   APPEARANCEUR Clear 04/27/2012 0958   LABSPEC 1.014 04/27/2012 0958   PHURINE 6.0 04/27/2012 0958   GLUCOSEU Negative 04/27/2012 0958   HGBUR 1+ 04/27/2012 0958   BILIRUBINUR neg 02/13/2018 0945   BILIRUBINUR Negative 04/27/2012 0958   KETONESUR Negative 04/27/2012 0958   PROTEINUR Negative 02/13/2018 0945   PROTEINUR Negative 04/27/2012 0958   UROBILINOGEN 0.2 02/13/2018 0945   NITRITE neg 02/13/2018  0945   NITRITE Negative 04/27/2012 0958   LEUKOCYTESUR Negative 02/13/2018 0945   LEUKOCYTESUR Negative 04/27/2012 0958     RADIOLOGY: Dg Chest 2 View  Result Date: 07/01/2018 CLINICAL DATA:   Central chest pain radiating to the back, dizziness. EXAM: CHEST - 2 VIEW COMPARISON:  03/23/2014. FINDINGS: The heart size and mediastinal contours are within normal limits. Both lungs are clear. The visualized skeletal structures are unremarkable. Mildly prominent central markings could represent bronchitic change. No effusion or pneumothorax. Bones unremarkable. IMPRESSION: No active cardiopulmonary disease.  Question mild bronchitic change. Electronically Signed   By: Staci Righter M.D.   On: 07/01/2018 19:50   Ct Head Wo Contrast  Result Date: 07/01/2018 CLINICAL DATA:  Dizziness, chest pain EXAM: CT HEAD WITHOUT CONTRAST TECHNIQUE: Contiguous axial images were obtained from the base of the skull through the vertex without intravenous contrast. COMPARISON:  None. FINDINGS: Brain: Mild low-density areas throughout the deep white matter, likely chronic small vessel disease. No acute intracranial abnormality. Specifically, no hemorrhage, hydrocephalus, mass lesion, acute infarction, or significant intracranial injury. Vascular: No hyperdense vessel or unexpected calcification. Skull: No acute calvarial abnormality. Sinuses/Orbits: Visualized paranasal sinuses and mastoids clear. Orbital soft tissues unremarkable. Other: None IMPRESSION: Chronic small vessel disease throughout the deep white matter. No acute intracranial abnormality. Electronically Signed   By: Rolm Baptise M.D.   On: 07/01/2018 21:18   Ct Angio Chest Pe W And/or Wo Contrast  Result Date: 07/02/2018 CLINICAL DATA:  Chest pain, shortness of breath, elevated D-dimer EXAM: CT ANGIOGRAPHY CHEST WITH CONTRAST TECHNIQUE: Multidetector CT imaging of the chest was performed using the standard protocol during bolus administration of intravenous contrast. Multiplanar CT image reconstructions and MIPs were obtained to evaluate the vascular anatomy. CONTRAST:  53mL OMNIPAQUE IOHEXOL 350 MG/ML SOLN COMPARISON:  Chest x-ray earlier today. FINDINGS:  Cardiovascular: Heart is normal size. Aorta is normal caliber. No filling defects in the pulmonary arteries to suggest pulmonary emboli. Mediastinum/Nodes: No mediastinal, hilar, or axillary adenopathy. Lungs/Pleura: Lungs are clear. No focal airspace opacities or suspicious nodules. No effusions. Upper Abdomen: Imaging into the upper abdomen shows no acute findings. Prior cholecystectomy Musculoskeletal: Chest wall soft tissues are unremarkable. No acute bony abnormality. Review of the MIP images confirms the above findings. IMPRESSION: No evidence of pulmonary embolus. No acute cardiopulmonary disease. Electronically Signed   By: Rolm Baptise M.D.   On: 07/02/2018 00:54    EKG: Orders placed or performed during the hospital encounter of 07/01/18  . EKG 12-Lead  . EKG 12-Lead  . ED EKG  . ED EKG    IMPRESSION AND PLAN: *Acute chest pain Referred to the observation unit on a chest pain protocol, continue to cycle cardiac enzymes, if rules out will proceed with nuclear medicine stress testing in the morning, aspirin, beta-blocker therapy, supplemental oxygen PRN, nitrates as needed, IV morphine PRN breakthrough pain, check lipids in the morning, cardiology to see, continue close medical monitoring  *Chronic asthma without exacerbation Stable Continue home regiment, breathing treatments PRN  *Chronic obstructive sleep apnea Stable CPAP at bedtime/as needed  *Chronic hypertension Continue home regiment, add Lopressor, PRN hydralazine, vitals per routine, make changes as per necessary  *Chronic anxiety Ativan as needed  *Chronic GERD PPI daily  *Chronic morbid obesity Most likely secondary to excess calories Lifestyle modification recommended   All the records are reviewed and case discussed with ED provider. Management plans discussed with the patient, family and they are in agreement.  CODE STATUS:full  TOTAL TIME TAKING CARE OF THIS PATIENT: 40 minutes.    Avel Peace  Righteous Claiborne M.D on 07/02/2018   Between 7am to 6pm - Pager - [phone]  After 6pm go to [email or web address] - password [email or web address] [place] [facility]  Office  [phone]  CC: Primary care physician; Glean Hess, MD   Note: This dictation was prepared with Dragon dictation along with smaller phrase technology. Any transcriptional errors that result from this process are unintentional.

## 2018-07-02 NOTE — Consult Note (Deleted)
Samantha Mason is a 68 y.o. female  [id number]  Primary Cardiologist: New patient to Dr. Neoma Laming Reason for Consultation: Chest pain  HPI: 68yo female with a past medical history of OSA, HTN, stroke, obesity, and anxiety presented to ER with acute chest pressure and back pain with shortness of breath, dizziness, and left facial numbness. Initial workup was negative with no acute changes on EKG, negative troponin x3, negative head CT and chest CT. Admitted for observation.    Review of Systems: Patient is feeling better today, mild chest tightness and reports shortness of breath with exertion, but no further dizziness.    Past Medical History:  Diagnosis Date  . Acute bronchitis   . Anxiety and depression   . Asthma   . Family history of blood clots 07/16/2017  . Irritable bowel syndrome   . Malignant hypertension   . Obesity   . OSA (obstructive sleep apnea)   . Restless leg syndrome   . Stroke [facility])     Medications Prior to Admission  Medication Sig Dispense Refill  . acetaminophen (TYLENOL) 500 MG tablet Take 2 tablets (1,000 mg total) by mouth every 8 (eight) hours as needed for moderate pain. 30 tablet 0  . albuterol (ACCUNEB) 1.25 MG/3ML nebulizer solution Take 1 ampule by nebulization every 6 (six) hours as needed for wheezing.    . budesonide-formoterol (SYMBICORT) 160-4.5 MCG/ACT inhaler Inhale 2 puffs into the lungs 2 (two) times daily as needed.    . cetirizine (ZYRTEC) 10 MG tablet Take 1 tablet (10 mg total) by mouth daily. 30 tablet 2  . cholestyramine (QUESTRAN) 4 GM/DOSE powder MIX 4GM AND DRINK BY MOUTH DAILY. 378 g 5  . diphenoxylate-atropine (LOMOTIL) 2.5-0.025 MG tablet Take 1 tablet by mouth 3 (three) times daily as needed for diarrhea or loose stools. 30 tablet 0  . lisinopril (PRINIVIL,ZESTRIL) 10 MG tablet TAKE 1 TABLET (10 MG TOTAL) BY MOUTH DAILY. 90 tablet 3  . NON FORMULARY CPAP at night    . pantoprazole (PROTONIX) 40 MG tablet Take 1 tablet (40  mg total) by mouth daily. 90 tablet 1  . rOPINIRole (REQUIP) 1 MG tablet Take 1 tablet (1 mg total) by mouth at bedtime. (Patient taking differently: Take 2 mg by mouth at bedtime. ) 90 tablet 3     . alum & mag hydroxide-simeth  30 mL Oral Once  . aspirin EC  325 mg Oral Daily  . cholestyramine light  4 g Oral QHS  . enoxaparin (LOVENOX) injection  40 mg Subcutaneous BID  . lisinopril  10 mg Oral Daily  . loratadine  10 mg Oral Daily  . metoprolol tartrate  25 mg Oral BID  . mometasone-formoterol  2 puff Inhalation BID  . pantoprazole  40 mg Oral Daily  . rOPINIRole  2 mg Oral QHS    Infusions:   Allergies  Allergen Reactions  . Penicillins Diarrhea and Nausea And Vomiting    Social History   Socioeconomic History  . Marital status: Widowed    Spouse name: Not on file  . Number of children: 2  . Years of education: Not on file  . Highest education level: Not on file  Occupational History  . Not on file  Social Needs  . Financial resource strain: Not on file  . Food insecurity:    Worry: Not on file    Inability: Not on file  . Transportation needs:    Medical: Not on file  Non-medical: Not on file  Tobacco Use  . Smoking status: Never Smoker  . Smokeless tobacco: Never Used  Substance and Sexual Activity  . Alcohol use: No    Alcohol/week: 0.0 standard drinks  . Drug use: No  . Sexual activity: Never  Lifestyle  . Physical activity:    Days per week: Not on file    Minutes per session: Not on file  . Stress: Not on file  Relationships  . Social connections:    Talks on phone: Not on file    Gets together: Not on file    Attends religious service: Not on file    Active member of club or organization: Not on file    Attends meetings of clubs or organizations: Not on file    Relationship status: Not on file  . Intimate partner violence:    Fear of current or ex partner: Not on file    Emotionally abused: Not on file    Physically abused: Not on file     Forced sexual activity: Not on file  Other Topics Concern  . Not on file  Social History Narrative  . Not on file    Family History  Problem Relation Age of Onset  . Dementia Mother   . COPD Father        48  . Diabetes Father   . Heart failure Brother        2010    PHYSICAL EXAM: Vitals:   07/02/18 0331 07/02/18 0813  BP: (!) 147/59 (!) 118/56  Pulse: 86 78  Resp: 20   Temp: 98.7 F (37.1 C) 98.4 F (36.9 C)  SpO2: 95% 93%    No intake or output data in the 24 hours ending 07/02/18 0845  General:  Well appearing. No respiratory difficulty HEENT: normal Neck: supple. no JVD. Carotids 2+ bilat; no bruits. No lymphadenopathy or thryomegaly appreciated. Cor: PMI nondisplaced. Regular rate & rhythm. No rubs, gallops or murmurs. Lungs: clear Abdomen: soft, nontender, nondistended. No hepatosplenomegaly. No bruits or masses. Good bowel sounds. Extremities: no cyanosis, clubbing, rash, edema Neuro: alert & oriented x 3, cranial nerves grossly intact. moves all 4 extremities w/o difficulty. Affect pleasant.  ECG: NSR 98bpm normal EKG  Results for orders placed or performed during the hospital encounter of 07/01/18 (from the past 24 hour(s))  Basic metabolic panel     Status: Abnormal   Collection Time: 07/01/18  7:22 PM  Result Value Ref Range   Sodium 137 135 - 145 mmol/L   Potassium 3.9 3.5 - 5.1 mmol/L   Chloride 104 98 - 111 mmol/L   CO2 26 22 - 32 mmol/L   Glucose, Bld 132 (H) 70 - 99 mg/dL   BUN 22 8 - 23 mg/dL   Creatinine, Ser 1.19 (H) 0.44 - 1.00 mg/dL   Calcium 9.5 8.9 - 10.3 mg/dL   GFR calc non Af Amer 48 (L) >60 mL/min   GFR calc Af Amer 55 (L) >60 mL/min   Anion gap 7 5 - 15  CBC     Status: Abnormal   Collection Time: 07/01/18  7:22 PM  Result Value Ref Range   WBC 10.1 4.0 - 10.5 K/uL   RBC 4.51 3.87 - 5.11 MIL/uL   Hemoglobin 11.9 (L) 12.0 - 15.0 g/dL   HCT 37.7 36.0 - 46.0 %   MCV 83.6 80.0 - 100.0 fL   MCH 26.4 26.0 - 34.0 pg   MCHC  31.6 30.0 - 36.0 g/dL  RDW 13.9 11.5 - 15.5 %   Platelets 313 150 - 400 K/uL   nRBC 0.0 0.0 - 0.2 %  Troponin I - ONCE - STAT     Status: None   Collection Time: 07/01/18  7:22 PM  Result Value Ref Range   Troponin I <0.03 <0.03 ng/mL  Troponin I - Once-Timed     Status: None   Collection Time: 07/01/18 10:13 PM  Result Value Ref Range   Troponin I <0.03 <0.03 ng/mL  Fibrin derivatives D-Dimer ([facility] only)     Status: Abnormal   Collection Time: 07/01/18 11:16 PM  Result Value Ref Range   Fibrin derivatives D-dimer ([facility]) 751.21 (H) 0.00 - 499.00 ng/mL (FEU)  Troponin I - Now Then Q3H     Status: None   Collection Time: 07/02/18  3:52 AM  Result Value Ref Range   Troponin I <0.03 <0.03 ng/mL  CBC     Status: Abnormal   Collection Time: 07/02/18  3:52 AM  Result Value Ref Range   WBC 10.1 4.0 - 10.5 K/uL   RBC 4.15 3.87 - 5.11 MIL/uL   Hemoglobin 10.9 (L) 12.0 - 15.0 g/dL   HCT 35.5 (L) 36.0 - 46.0 %   MCV 85.5 80.0 - 100.0 fL   MCH 26.3 26.0 - 34.0 pg   MCHC 30.7 30.0 - 36.0 g/dL   RDW 14.1 11.5 - 15.5 %   Platelets 297 150 - 400 K/uL   nRBC 0.0 0.0 - 0.2 %  Creatinine, serum     Status: Abnormal   Collection Time: 07/02/18  3:52 AM  Result Value Ref Range   Creatinine, Ser 1.09 (H) 0.44 - 1.00 mg/dL   GFR calc non Af Amer 53 (L) >60 mL/min   GFR calc Af Amer >60 >60 mL/min   Dg Chest 2 View  Result Date: 07/01/2018 CLINICAL DATA:  Central chest pain radiating to the back, dizziness. EXAM: CHEST - 2 VIEW COMPARISON:  03/23/2014. FINDINGS: The heart size and mediastinal contours are within normal limits. Both lungs are clear. The visualized skeletal structures are unremarkable. Mildly prominent central markings could represent bronchitic change. No effusion or pneumothorax. Bones unremarkable. IMPRESSION: No active cardiopulmonary disease.  Question mild bronchitic change. Electronically Signed   By: Staci Righter M.D.   On: 07/01/2018 19:50   Ct Head Wo Contrast  Result  Date: 07/01/2018 CLINICAL DATA:  Dizziness, chest pain EXAM: CT HEAD WITHOUT CONTRAST TECHNIQUE: Contiguous axial images were obtained from the base of the skull through the vertex without intravenous contrast. COMPARISON:  None. FINDINGS: Brain: Mild low-density areas throughout the deep white matter, likely chronic small vessel disease. No acute intracranial abnormality. Specifically, no hemorrhage, hydrocephalus, mass lesion, acute infarction, or significant intracranial injury. Vascular: No hyperdense vessel or unexpected calcification. Skull: No acute calvarial abnormality. Sinuses/Orbits: Visualized paranasal sinuses and mastoids clear. Orbital soft tissues unremarkable. Other: None IMPRESSION: Chronic small vessel disease throughout the deep white matter. No acute intracranial abnormality. Electronically Signed   By: Rolm Baptise M.D.   On: 07/01/2018 21:18   Ct Angio Chest Pe W And/or Wo Contrast  Result Date: 07/02/2018 CLINICAL DATA:  Chest pain, shortness of breath, elevated D-dimer EXAM: CT ANGIOGRAPHY CHEST WITH CONTRAST TECHNIQUE: Multidetector CT imaging of the chest was performed using the standard protocol during bolus administration of intravenous contrast. Multiplanar CT image reconstructions and MIPs were obtained to evaluate the vascular anatomy. CONTRAST:  11mL OMNIPAQUE IOHEXOL 350 MG/ML SOLN COMPARISON:  Chest x-ray earlier  today. FINDINGS: Cardiovascular: Heart is normal size. Aorta is normal caliber. No filling defects in the pulmonary arteries to suggest pulmonary emboli. Mediastinum/Nodes: No mediastinal, hilar, or axillary adenopathy. Lungs/Pleura: Lungs are clear. No focal airspace opacities or suspicious nodules. No effusions. Upper Abdomen: Imaging into the upper abdomen shows no acute findings. Prior cholecystectomy Musculoskeletal: Chest wall soft tissues are unremarkable. No acute bony abnormality. Review of the MIP images confirms the above findings. IMPRESSION: No evidence of  pulmonary embolus. No acute cardiopulmonary disease. Electronically Signed   By: Rolm Baptise M.D.   On: 07/02/2018 00:54     ASSESSMENT AND PLAN: Chest pain and dizziness of unknown etiology: negative troponin x 3, normal EKG, negative chest CT, continues to have mild chest tightness, but much improved from yesterday.Suspect non-cardiac etiology.   Advise discharge today with outpatient follow up and testing. Will schedule CCTA and echo as early as today.   Schedule testing at [facility], will place orders.    Jake Bathe, NP-C Cell: [phone]

## 2018-07-02 NOTE — Consult Note (Signed)
Cardiology Consultation:   Patient ID: Samantha Mason MRN: [id number]; DOB: 1950/11/12  Admit date: 07/01/2018 Date of Consult: 07/02/2018  Primary Care Provider: Glean Hess, MD Primary Cardiologist: Iverson Alamin, Dr. Rockey Situ Primary Electrophysiologist:  None    Patient Profile:   Samantha Mason is a 68 y.o. female with a hx of malignant HTN, obesity, OSA, and past h/o CVA but no other known past cardiac medical history who is being seen today for the evaluation of atypical chest pain at the request of Dr. Verdell Carmine.  History of Present Illness:   Samantha Mason is a 68 yo female with no past cardiac history other than HTN on PTA lisinopril 10mg . 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   Over the last few weeks, the patient has reportedly felt very fatigued despite increasing her amount of sleep. She also reported she chronically feels cold.  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 The CP started in her chest and radiated to her back. It progressed while she was working at the [employer], causing her to need to sit down. Associated symptoms included progressive up to 9/10 in severity "throbbing headache," worse in the right occipital region. No associated nausea, emesis, or SOB. She did note similar sx in the past and reportedly at the time of her stroke; however, she stated that at the time of her CVA, she also had left sided facial numbness. Some RNs present at that time did an initial EKG and felt it might be abnormal so referred her to the ED for for further workup. In the ED vitals significant for BP 157/67, HR 97. Labs with glucose 132, Cr 1.19, Hgb 11.9. EKG NSR 98 bpm, no acute changes. Ddimer 751.21. CT without PE.   At the time of consultation, patient stated she also had baseline worry regarding a family history PE. Since  admission, patient has had an episode of nausea, relieved with zofran.  Past Medical History:  Diagnosis Date  . Acute bronchitis   . Anxiety and depression   . Asthma   . Family history of blood clots 07/16/2017  . Irritable bowel syndrome   . Malignant hypertension   . Obesity   . OSA (obstructive sleep apnea)   . Restless leg syndrome   . Stroke Samantha Mason)     Past Surgical History:  Procedure Laterality Date  . CHOLECYSTECTOMY    . COLONOSCOPY WITH PROPOFOL N/A 03/31/2018   Procedure: COLONOSCOPY WITH PROPOFOL;  Surgeon: Lucilla Lame, MD;  Location: [facility] ENDOSCOPY;  Service: Endoscopy;  Laterality: N/A;  . JOINT REPLACEMENT  2014  . saliva gland removal  1998  . TOTAL KNEE ARTHROPLASTY Left 2014  . TUBAL LIGATION Bilateral      Home Medications:  Prior to Admission medications   Medication Sig Start Date End Date Taking? Authorizing Provider  acetaminophen (TYLENOL) 500 MG tablet Take 2 tablets (1,000 mg total) by mouth every 8 (eight) hours as needed for moderate pain. 04/09/17  Yes Plonk, [name], MD  albuterol (ACCUNEB) 1.25 MG/3ML nebulizer solution Take 1 ampule by nebulization every 6 (six) hours as needed for wheezing.   Yes [provider]  budesonide-formoterol (SYMBICORT) 160-4.5 MCG/ACT inhaler Inhale 2 puffs into the lungs 2 (two) times daily as needed.   Yes [provider]  cetirizine (ZYRTEC) 10 MG tablet Take 1 tablet (10 mg total)  by mouth daily. 12/07/14  Yes Ashok Norris, MD  cholestyramine Lucrezia Starch) 4 GM/DOSE powder MIX 4GM AND DRINK BY MOUTH DAILY. 03/20/18  Yes Glean Hess, MD  diphenoxylate-atropine (LOMOTIL) 2.5-0.025 MG tablet Take 1 tablet by mouth 3 (three) times daily as needed for diarrhea or loose stools. 10/16/16  Yes Keith Rake Asad A, MD  lisinopril (PRINIVIL,ZESTRIL) 10 MG tablet TAKE 1 TABLET (10 MG TOTAL) BY MOUTH DAILY. 05/26/18  Yes Glean Hess, MD  NON FORMULARY CPAP at night   Yes [provider]    pantoprazole (PROTONIX) 40 MG tablet Take 1 tablet (40 mg total) by mouth daily. 03/27/18  Yes Glean Hess, MD  rOPINIRole (REQUIP) 1 MG tablet Take 1 tablet (1 mg total) by mouth at bedtime. Patient taking differently: Take 2 mg by mouth at bedtime.  02/13/18  Yes Glean Hess, MD    Inpatient Medications: Scheduled Meds: . alum & mag hydroxide-simeth  30 mL Oral Once  . aspirin EC  325 mg Oral Daily  . cholestyramine light  4 g Oral QHS  . enoxaparin (LOVENOX) injection  40 mg Subcutaneous BID  . lisinopril  10 mg Oral Daily  . loratadine  10 mg Oral Daily  . metoprolol tartrate  25 mg Oral BID  . mometasone-formoterol  2 puff Inhalation BID  . pantoprazole  40 mg Oral Daily  . rOPINIRole  2 mg Oral QHS   Continuous Infusions:  PRN Meds: acetaminophen, albuterol, ALPRAZolam, diphenoxylate-atropine, hydrALAZINE, morphine injection, nitroGLYCERIN, ondansetron (ZOFRAN) IV  Allergies:    Allergies  Allergen Reactions  . Penicillins Diarrhea and Nausea And Vomiting    Social History:   Social History   Socioeconomic History  . Marital status: Widowed    Spouse name: Not on file  . Number of children: 2  . Years of education: Not on file  . Highest education level: Not on file  Occupational History  . Not on file  Social Needs  . Financial resource strain: Not on file  . Food insecurity:    Worry: Not on file    Inability: Not on file  . Transportation needs:    Medical: Not on file    Non-medical: Not on file  Tobacco Use  . Smoking status: Never Smoker  . Smokeless tobacco: Never Used  Substance and Sexual Activity  . Alcohol use: No    Alcohol/week: 0.0 standard drinks  . Drug use: No  . Sexual activity: Never  Lifestyle  . Physical activity:    Days per week: Not on file    Minutes per session: Not on file  . Stress: Not on file  Relationships  . Social connections:    Talks on phone: Not on file    Gets together: Not on file    Attends  religious service: Not on file    Active member of club or organization: Not on file    Attends meetings of clubs or organizations: Not on file    Relationship status: Not on file  . Intimate partner violence:    Fear of current or ex partner: Not on file    Emotionally abused: Not on file    Physically abused: Not on file    Forced sexual activity: Not on file  Other Topics Concern  . Not on file  Social History Narrative  . Not on file    Family History:    Family History  Problem Relation Age of Onset  . Dementia Mother   .  COPD Father        36  . Diabetes Father   . Heart failure Brother        2010     ROS:  Please see the history of present illness.   Review of Systems  Constitutional: Positive for malaise/fatigue. Negative for chills and fever.  Respiratory: Negative for cough and shortness of breath.   Cardiovascular: Positive for chest pain. Negative for palpitations, orthopnea and leg swelling.  Genitourinary:       Gas  Neurological: Positive for dizziness.  Psychiatric/Behavioral:       Anxiety d/t family history of PE Hypersomnia  All other systems reviewed and are negative.   All other ROS reviewed and negative.     Physical Exam/Data:   Vitals:   07/02/18 0200 07/02/18 0230 07/02/18 0306 07/02/18 0331  BP: 132/60 (!) 141/55 (!) 138/49 (!) 147/59  Pulse: 84 86 84 86  Resp:   17 20  Temp:    98.7 F (37.1 C)  TempSrc:    Oral  SpO2: 96% 95% 97% 95%  Weight:      Height:       No intake or output data in the 24 hours ending 07/02/18 0816 Filed Weights   07/01/18 1918  Weight: 130.2 kg   Body mass index is 49.26 kg/m.  General:  Well nourished, well developed, in no acute distress HEENT: normal Neck: JVD difficult to assess d/t body habitus Vascular: Radial pulses 2+ bilaterally  No carotid bruit bilaterally Cardiac:  normal S1, S2; RRR; no murmur  Lungs:  clear to auscultation bilaterally, no wheezing, rhonchi or rales  Abd: soft,  nontender, no hepatomegaly  Ext: no bilateral lower extremity edema, pedal pulses 2+ Musculoskeletal:  No deformities Skin: warm and dry  Neuro:  no focal abnormalities noted Psych:  Normal affect   EKG: Refer to HPI: NSR, no acute changes Telemetry:  Telemetry was personally reviewed and demonstrates:  NSR  CV Studies:   Relevant CV Studies:  Pending NM study  Laboratory Data:  Chemistry Recent Labs  Lab 07/01/18 1922 07/02/18 0352  NA 137  --   K 3.9  --   CL 104  --   CO2 26  --   GLUCOSE 132*  --   BUN 22  --   CREATININE 1.19* 1.09*  CALCIUM 9.5  --   GFRNONAA 48* 53*  GFRAA 55* >60  ANIONGAP 7  --     No results for input(s): PROT, ALBUMIN, AST, ALT, ALKPHOS, BILITOT in the last 168 hours. Hematology Recent Labs  Lab 07/01/18 1922 07/02/18 0352  WBC 10.1 10.1  RBC 4.51 4.15  HGB 11.9* 10.9*  HCT 37.7 35.5*  MCV 83.6 85.5  MCH 26.4 26.3  MCHC 31.6 30.7  RDW 13.9 14.1  PLT 313 297   Cardiac Enzymes Recent Labs  Lab 07/01/18 1922 07/01/18 2213 07/02/18 0352  TROPONINI <0.03 <0.03 <0.03   No results for input(s): TROPIPOC in the last 168 hours.  BNPNo results for input(s): BNP, PROBNP in the last 168 hours.  DDimer No results for input(s): DDIMER in the last 168 hours.  Radiology/Studies:  Dg Chest 2 View  Result Date: 07/01/2018 CLINICAL DATA:  Central chest pain radiating to the back, dizziness. EXAM: CHEST - 2 VIEW COMPARISON:  03/23/2014. FINDINGS: The heart size and mediastinal contours are within normal limits. Both lungs are clear. The visualized skeletal structures are unremarkable. Mildly prominent central markings could represent bronchitic change. No effusion  or pneumothorax. Bones unremarkable. IMPRESSION: No active cardiopulmonary disease.  Question mild bronchitic change. Electronically Signed   By: Staci Righter M.D.   On: 07/01/2018 19:50   Ct Head Wo Contrast  Result Date: 07/01/2018 CLINICAL DATA:  Dizziness, chest pain EXAM: CT  HEAD WITHOUT CONTRAST TECHNIQUE: Contiguous axial images were obtained from the base of the skull through the vertex without intravenous contrast. COMPARISON:  None. FINDINGS: Brain: Mild low-density areas throughout the deep white matter, likely chronic small vessel disease. No acute intracranial abnormality. Specifically, no hemorrhage, hydrocephalus, mass lesion, acute infarction, or significant intracranial injury. Vascular: No hyperdense vessel or unexpected calcification. Skull: No acute calvarial abnormality. Sinuses/Orbits: Visualized paranasal sinuses and mastoids clear. Orbital soft tissues unremarkable. Other: None IMPRESSION: Chronic small vessel disease throughout the deep white matter. No acute intracranial abnormality. Electronically Signed   By: Rolm Baptise M.D.   On: 07/01/2018 21:18   Ct Angio Chest Pe W And/or Wo Contrast  Result Date: 07/02/2018 CLINICAL DATA:  Chest pain, shortness of breath, elevated D-dimer EXAM: CT ANGIOGRAPHY CHEST WITH CONTRAST TECHNIQUE: Multidetector CT imaging of the chest was performed using the standard protocol during bolus administration of intravenous contrast. Multiplanar CT image reconstructions and MIPs were obtained to evaluate the vascular anatomy. CONTRAST:  67mL OMNIPAQUE IOHEXOL 350 MG/ML SOLN COMPARISON:  Chest x-ray earlier today. FINDINGS: Cardiovascular: Heart is normal size. Aorta is normal caliber. No filling defects in the pulmonary arteries to suggest pulmonary emboli. Mediastinum/Nodes: No mediastinal, hilar, or axillary adenopathy. Lungs/Pleura: Lungs are clear. No focal airspace opacities or suspicious nodules. No effusions. Upper Abdomen: Imaging into the upper abdomen shows no acute findings. Prior cholecystectomy Musculoskeletal: Chest wall soft tissues are unremarkable. No acute bony abnormality. Review of the MIP images confirms the above findings. IMPRESSION: No evidence of pulmonary embolus. No acute cardiopulmonary disease.  Electronically Signed   By: Rolm Baptise M.D.   On: 07/02/2018 00:54    Assessment and Plan:   Atypical chest pain  - No current chest pain. No known history of cardiac disease.  - Troponin negative. EKG without acute changes. Chest pain reported in the setting of elevated BP. Ddimer 700s but CTA negative.   - Rules out for ACS given negative enzymes and EKG without changes. - In the ED, started on ASA 325mg  daily. Patient has remote h/o stroke but as not on as PTA medication, consider stopping or changing to 81mg  daily. Started on Hydralazine PRN for elevated SBP >160. Continue with PTA lisinopril. Pending A1C and NM study to further risk stratify. If low risk study, no further ischemic workup needed and patient could follow-up as outpatient if further chest pain. Recommendation for aggressive risk factor of elevated BP, however, as well as diet and exercise changes. - Pending NM study results.  HTN - Currently controlled at 118/49, HR 75. Continue lisinopril 10mg .  Could consider escalation of lisinopril to 20mg  daily.       AKI - Cr mildly elevated from baseline in ED, now decreasing: 0.9  1.19  1.09. Daily BMET to monitor renal function and electrolytes.    Anemia - Hgb 11.9  10.9. Hct decreased. Monitor with daily CBC. Recommend transfusion for Hgb less than 8. Stop ASA and consider etiology workup with further drop.  OSA - Continue CPAP as needed  Obesity - Lifestyle changes recommended including diet and exercise  For questions or updates, please contact [place] Please consult [email or web address] for contact info under     Signed, Robin Searing  Mickle Plumb, PA-C  07/02/2018 8:16 AM

## 2018-07-02 NOTE — Progress Notes (Signed)
Patient admitted for CP while working and was feeling dizzy. Went to ED for CP workup, trops neg, EKG NSR, although D-dimer was elevated, CT showed no PE. Patient is being admitted for observation.  Patient was ordered lovenox 40 mg daily for VTE prophylaxis; however, BMI > 40. CrCl 62.3 ml/min (Scr 1.19 baseline 0.92 - 0.94).  As a result will readjust lovenox to 40 mg twice daily for BMI > 40 w/ CrCl > 30 ml/min.  Tobie Lords, PharmD, BCPS Clinical Pharmacist 07/02/2018

## 2018-07-02 NOTE — Progress Notes (Signed)
Stress test within normal limits.  Discharged to home.  No changes in her medications.  She will schedule a follow up with her PCP.

## 2018-07-02 NOTE — Discharge Summary (Signed)
[facility] [place] NAME: Samantha Mason    MR#:  [id number]  DATE OF BIRTH:  1951/05/02  DATE OF ADMISSION:  07/01/2018 ADMITTING PHYSICIAN: Gorden Harms, MD  DATE OF DISCHARGE: 07/02/2018  PRIMARY CARE PHYSICIAN: Glean Hess, MD    ADMISSION DIAGNOSIS:  Nonintractable headache, unspecified chronicity pattern, unspecified headache type [R51] Chest pain, unspecified type [R07.9]  DISCHARGE DIAGNOSIS:  Active Problems:   Chest pain   SECONDARY DIAGNOSIS:   Past Medical History:  Diagnosis Date  . Acute bronchitis   . Anxiety and depression   . Asthma   . Family history of blood clots 07/16/2017  . Irritable bowel syndrome   . Malignant hypertension   . Obesity   . OSA (obstructive sleep apnea)   . Restless leg syndrome   . Stroke [facility])     HOSPITAL COURSE:   68 year old female with past medical history obstructive sleep apnea, restless leg syndrome, history of previous CVA, anxiety/depression, asthma, hypertension who presented to the hospital due to chest pain, shortness of breath.  1.  Chest pain/shortness of breath that given patient's risk factors she was observed overnight telemetry.  3 sets of cardiac markers were checked which were negative. - Patient had CT chest with contrast which showed no evidence of acute pulmonary embolism. -Patient also underwent a nuclear medicine stress test which was negative for acute ischemia.  She is clinically symptomatic and therefore being discharged.  2.  History of asthma-no acute exacerbation.  Patient will continue her Symbicort.  3.  Restless leg syndrome-patient will continue her Requip.    4.  Essential hypertension-continue lisinopril.  DISCHARGE CONDITIONS:   Stable.   CONSULTS OBTAINED:  Treatment Team:  Minna Merritts, MD  DRUG ALLERGIES:   Allergies  Allergen Reactions  . Penicillins Diarrhea and Nausea And Vomiting    DISCHARGE MEDICATIONS:    Allergies as of 07/02/2018      Reactions   Penicillins Diarrhea, Nausea And Vomiting      Medication List    TAKE these medications   acetaminophen 500 MG tablet Commonly known as:  TYLENOL Take 2 tablets (1,000 mg total) by mouth every 8 (eight) hours as needed for moderate pain. Notes to patient:  NONE GIVEN TODAY\   albuterol 1.25 MG/3ML nebulizer solution Commonly known as:  ACCUNEB Take 1 ampule by nebulization every 6 (six) hours as needed for wheezing. Notes to patient:  NONE GIVEN TODAY   budesonide-formoterol 160-4.5 MCG/ACT inhaler Commonly known as:  SYMBICORT Inhale 2 puffs into the lungs 2 (two) times daily as needed. Notes to patient:  NONE GIVEN TODAY   cetirizine 10 MG tablet Commonly known as:  ZYRTEC Take 1 tablet (10 mg total) by mouth daily.   cholestyramine 4 GM/DOSE powder Commonly known as:  QUESTRAN MIX 4GM AND DRINK BY MOUTH DAILY.   diphenoxylate-atropine 2.5-0.025 MG tablet Commonly known as:  LOMOTIL Take 1 tablet by mouth 3 (three) times daily as needed for diarrhea or loose stools. Notes to patient:  NONE GIVEN TODAY   lisinopril 10 MG tablet Commonly known as:  PRINIVIL,ZESTRIL TAKE 1 TABLET (10 MG TOTAL) BY MOUTH DAILY.   NON FORMULARY CPAP at night   pantoprazole 40 MG tablet Commonly known as:  PROTONIX Take 1 tablet (40 mg total) by mouth daily.   rOPINIRole 1 MG tablet Commonly known as:  REQUIP Take 1 tablet (1 mg total) by mouth at bedtime. What changed:  how much  to take         DISCHARGE INSTRUCTIONS:   DIET:  Cardiac diet  DISCHARGE CONDITION:  Stable  ACTIVITY:  Activity as tolerated  OXYGEN:  Home Oxygen: No.   Oxygen Delivery: room air  DISCHARGE LOCATION:  home   If you experience worsening of your admission symptoms, develop shortness of breath, life threatening emergency, suicidal or homicidal thoughts you must seek medical attention immediately by calling 911 or calling your MD immediately   if symptoms less severe.  You Must read complete instructions/literature along with all the possible adverse reactions/side effects for all the Medicines you take and that have been prescribed to you. Take any new Medicines after you have completely understood and accpet all the possible adverse reactions/side effects.   Please note  You were cared for by a hospitalist during your hospital stay. If you have any questions about your discharge medications or the care you received while you were in the hospital after you are discharged, you can call the unit and asked to speak with the hospitalist on call if the hospitalist that took care of you is not available. Once you are discharged, your primary care physician will handle any further medical issues. Please note that NO REFILLS for any discharge medications will be authorized once you are discharged, as it is imperative that you return to your primary care physician (or establish a relationship with a primary care physician if you do not have one) for your aftercare needs so that they can reassess your need for medications and monitor your lab values.     Today   No acute complaints presently, chest pain has resolved.  Will discharge home today.  VITAL SIGNS:  Blood pressure (!) 118/49, pulse 75, temperature 98.4 F (36.9 C), temperature source Oral, resp. rate 20, height 5\' 4"  (1.626 m), weight 130.2 kg, SpO2 93 %.  I/O:    Intake/Output Summary (Last 24 hours) at 07/02/2018 1433 Last data filed at 07/02/2018 1300 Gross per 24 hour  Intake 240 ml  Output -  Net 240 ml    PHYSICAL EXAMINATION:  GENERAL:  68 y.o.-year-old patient lying in the bed with no acute distress.  EYES: Pupils equal, round, reactive to light and accommodation. No scleral icterus. Extraocular muscles intact.  HEENT: Head atraumatic, normocephalic. Oropharynx and nasopharynx clear.  NECK:  Supple, no jugular venous distention. No thyroid enlargement, no  tenderness.  LUNGS: Normal breath sounds bilaterally, no wheezing, rales,rhonchi. No use of accessory muscles of respiration.  CARDIOVASCULAR: S1, S2 normal. No murmurs, rubs, or gallops.  ABDOMEN: Soft, non-tender, non-distended. Bowel sounds present. No organomegaly or mass.  EXTREMITIES: No pedal edema, cyanosis, or clubbing.  NEUROLOGIC: Cranial nerves II through XII are intact. No focal motor or sensory defecits b/l.  PSYCHIATRIC: The patient is alert and oriented x 3.  SKIN: No obvious rash, lesion, or ulcer.   DATA REVIEW:   CBC Recent Labs  Lab 07/02/18 0352  WBC 10.1  HGB 10.9*  HCT 35.5*  PLT 297    Chemistries  Recent Labs  Lab 07/01/18 1922 07/02/18 0352  NA 137  --   K 3.9  --   CL 104  --   CO2 26  --   GLUCOSE 132*  --   BUN 22  --   CREATININE 1.19* 1.09*  CALCIUM 9.5  --     Cardiac Enzymes Recent Labs  Lab 07/02/18 [place] <0.03    Microbiology Results  No  results found for this or any previous visit.  RADIOLOGY:  Dg Chest 2 View  Result Date: 07/01/2018 CLINICAL DATA:  Central chest pain radiating to the back, dizziness. EXAM: CHEST - 2 VIEW COMPARISON:  03/23/2014. FINDINGS: The heart size and mediastinal contours are within normal limits. Both lungs are clear. The visualized skeletal structures are unremarkable. Mildly prominent central markings could represent bronchitic change. No effusion or pneumothorax. Bones unremarkable. IMPRESSION: No active cardiopulmonary disease.  Question mild bronchitic change. Electronically Signed   By: Staci Righter M.D.   On: 07/01/2018 19:50   Ct Head Wo Contrast  Result Date: 07/01/2018 CLINICAL DATA:  Dizziness, chest pain EXAM: CT HEAD WITHOUT CONTRAST TECHNIQUE: Contiguous axial images were obtained from the base of the skull through the vertex without intravenous contrast. COMPARISON:  None. FINDINGS: Brain: Mild low-density areas throughout the deep white matter, likely chronic small vessel  disease. No acute intracranial abnormality. Specifically, no hemorrhage, hydrocephalus, mass lesion, acute infarction, or significant intracranial injury. Vascular: No hyperdense vessel or unexpected calcification. Skull: No acute calvarial abnormality. Sinuses/Orbits: Visualized paranasal sinuses and mastoids clear. Orbital soft tissues unremarkable. Other: None IMPRESSION: Chronic small vessel disease throughout the deep white matter. No acute intracranial abnormality. Electronically Signed   By: Rolm Baptise M.D.   On: 07/01/2018 21:18   Ct Angio Chest Pe W And/or Wo Contrast  Result Date: 07/02/2018 CLINICAL DATA:  Chest pain, shortness of breath, elevated D-dimer EXAM: CT ANGIOGRAPHY CHEST WITH CONTRAST TECHNIQUE: Multidetector CT imaging of the chest was performed using the standard protocol during bolus administration of intravenous contrast. Multiplanar CT image reconstructions and MIPs were obtained to evaluate the vascular anatomy. CONTRAST:  89mL OMNIPAQUE IOHEXOL 350 MG/ML SOLN COMPARISON:  Chest x-ray earlier today. FINDINGS: Cardiovascular: Heart is normal size. Aorta is normal caliber. No filling defects in the pulmonary arteries to suggest pulmonary emboli. Mediastinum/Nodes: No mediastinal, hilar, or axillary adenopathy. Lungs/Pleura: Lungs are clear. No focal airspace opacities or suspicious nodules. No effusions. Upper Abdomen: Imaging into the upper abdomen shows no acute findings. Prior cholecystectomy Musculoskeletal: Chest wall soft tissues are unremarkable. No acute bony abnormality. Review of the MIP images confirms the above findings. IMPRESSION: No evidence of pulmonary embolus. No acute cardiopulmonary disease. Electronically Signed   By: Rolm Baptise M.D.   On: 07/02/2018 00:54   Nm Myocar Multi W/spect W/wall Motion / Ef  Result Date: 07/02/2018 Pharmacological myocardial perfusion imaging study with no significant  ischemia Normal wall motion, EF estimated at 66% No EKG changes  concerning for ischemia at peak stress or in recovery. Low risk scan Signed, Esmond Plants, MD, Ph.D [facility]      Management plans discussed with the patient, family and they are in agreement.  CODE STATUS:     Code Status Orders  (From admission, onward)         Start     Ordered   07/02/18 0321  Full code  Continuous     07/02/18 0320        Code Status History    This patient has a current code status but no historical code status.      TOTAL TIME TAKING CARE OF THIS PATIENT: 40 minutes.    Henreitta Leber M.D on 07/02/2018 at 2:33 PM  Between 7am to 6pm - Pager - [phone]  After 6pm go to [email or web address] - Proofreader  [facility]  Office  [phone]  CC: Primary care physician; Glean Hess, MD

## 2018-07-02 NOTE — Progress Notes (Signed)
Family Meeting Note  Advance Directive:yes  Today a meeting took place with the Patient.  Patient is able to participate   The following clinical team members were present during this meeting:MD  The following were discussed:Patient's diagnosis:CP , Patient's progosis: Unable to determine and Goals for treatment: Full Code  Additional follow-up to be provided: prn  Time spent during discussion:20 minutes  Gorden Harms, MD

## 2018-07-02 NOTE — Progress Notes (Signed)
[place] was admitted to the Hospital on 07/01/2018 and Discharged  07/02/2018 and should be excused from work/school   for 3 days starting 07/01/2018 , may return to work/school without any restrictions.  Call Abel Presto MD, [facility]  [phone] with questions.  Henreitta Leber M.D on 07/02/2018,at 2:46 PM

## 2018-07-02 NOTE — ED Notes (Signed)
ED TO INPATIENT HANDOFF REPORT  Name/Age/Gender Samantha Mason 68 y.o. female  Code Status   Home/SNF/Other Home  Chief Complaint lightheaded, SOB, chest pain  Level of Care/Admitting Diagnosis ED Disposition    ED Disposition Condition [place]: [place] [100120]  Level of Care: Med-Surg [16]  Diagnosis: Chest pain [789381]  Admitting Physician: Gorden Harms [[id number]]  Attending Physician: Gorden Harms [[id number]]  PT Class (Do Not Modify): Observation [104]  PT Acc Code (Do Not Modify): Observation [10022]       Medical History Past Medical History:  Diagnosis Date  . Acute bronchitis   . Anxiety and depression   . Asthma   . Family history of blood clots 07/16/2017  . Irritable bowel syndrome   . Malignant hypertension   . Obesity   . OSA (obstructive sleep apnea)   . Restless leg syndrome   . Stroke [facility])     Allergies Allergies  Allergen Reactions  . Penicillins Diarrhea and Nausea And Vomiting    IV Location/Drains/Wounds Patient Lines/Drains/Airways Status   Active Line/Drains/Airways    Name:   Placement date:   Placement time:   Site:   Days:   Peripheral IV 07/01/18 Right Antecubital   07/01/18    2117    Antecubital   1          Labs/Imaging Results for orders placed or performed during the hospital encounter of 07/01/18 (from the past 48 hour(s))  Basic metabolic panel     Status: Abnormal   Collection Time: 07/01/18  7:22 PM  Result Value Ref Range   Sodium 137 135 - 145 mmol/L   Potassium 3.9 3.5 - 5.1 mmol/L   Chloride 104 98 - 111 mmol/L   CO2 26 22 - 32 mmol/L   Glucose, Bld 132 (H) 70 - 99 mg/dL   BUN 22 8 - 23 mg/dL   Creatinine, Ser 1.19 (H) 0.44 - 1.00 mg/dL   Calcium 9.5 8.9 - 10.3 mg/dL   GFR calc non Af Amer 48 (L) >60 mL/min   GFR calc Af Amer 55 (L) >60 mL/min   Anion gap 7 5 - 15    Comment: Performed at [facility], [address]  [place], [place] [address]  CBC     Status: Abnormal   Collection Time: 07/01/18  7:22 PM  Result Value Ref Range   WBC 10.1 4.0 - 10.5 K/uL   RBC 4.51 3.87 - 5.11 MIL/uL   Hemoglobin 11.9 (L) 12.0 - 15.0 g/dL   HCT 37.7 36.0 - 46.0 %   MCV 83.6 80.0 - 100.0 fL   MCH 26.4 26.0 - 34.0 pg   MCHC 31.6 30.0 - 36.0 g/dL   RDW 13.9 11.5 - 15.5 %   Platelets 313 150 - 400 K/uL   nRBC 0.0 0.0 - 0.2 %    Comment: Performed at [facility], [address] [place], [place] [address]  Troponin I - ONCE - STAT     Status: None   Collection Time: 07/01/18  7:22 PM  Result Value Ref Range   Troponin I <0.03 <0.03 ng/mL    Comment: Performed at [facility], [address] [place], [place] [address]  Troponin I - Once-Timed     Status: None   Collection Time: 07/01/18 10:13 PM  Result Value Ref Range   Troponin I <0.03 <0.03 ng/mL    Comment: Performed at [facility]  [facility], [place], [place] [address]  Fibrin derivatives D-Dimer [facility] only)     Status: Abnormal   Collection Time: 07/01/18 11:16 PM  Result Value Ref Range   Fibrin derivatives D-dimer ([facility]) 751.21 (H) 0.00 - 499.00 ng/mL (FEU)    Comment: (NOTE) <> Exclusion of Venous Thromboembolism (VTE) - OUTPATIENT ONLY   (Emergency Department or [place])   0-499 ng/ml (FEU): With a low to intermediate pretest probability                      for VTE this test result excludes the diagnosis                      of VTE.   >499 ng/ml (FEU) : VTE not excluded; additional work up for VTE is                      required. <> Testing on Inpatients and Evaluation of Disseminated Intravascular   Coagulation (DIC) Reference Range:   0-499 ng/ml (FEU) Performed at [facility], [address] [place], [place] [address]    Dg Chest 2 View  Result Date: 07/01/2018 CLINICAL DATA:  Central chest pain radiating to the back, dizziness. EXAM: CHEST - 2 VIEW COMPARISON:  03/23/2014. FINDINGS: The heart size  and mediastinal contours are within normal limits. Both lungs are clear. The visualized skeletal structures are unremarkable. Mildly prominent central markings could represent bronchitic change. No effusion or pneumothorax. Bones unremarkable. IMPRESSION: No active cardiopulmonary disease.  Question mild bronchitic change. Electronically Signed   By: Staci Righter M.D.   On: 07/01/2018 19:50   Ct Head Wo Contrast  Result Date: 07/01/2018 CLINICAL DATA:  Dizziness, chest pain EXAM: CT HEAD WITHOUT CONTRAST TECHNIQUE: Contiguous axial images were obtained from the base of the skull through the vertex without intravenous contrast. COMPARISON:  None. FINDINGS: Brain: Mild low-density areas throughout the deep white matter, likely chronic small vessel disease. No acute intracranial abnormality. Specifically, no hemorrhage, hydrocephalus, mass lesion, acute infarction, or significant intracranial injury. Vascular: No hyperdense vessel or unexpected calcification. Skull: No acute calvarial abnormality. Sinuses/Orbits: Visualized paranasal sinuses and mastoids clear. Orbital soft tissues unremarkable. Other: None IMPRESSION: Chronic small vessel disease throughout the deep white matter. No acute intracranial abnormality. Electronically Signed   By: Rolm Baptise M.D.   On: 07/01/2018 21:18   Ct Angio Chest Pe W And/or Wo Contrast  Result Date: 07/02/2018 CLINICAL DATA:  Chest pain, shortness of breath, elevated D-dimer EXAM: CT ANGIOGRAPHY CHEST WITH CONTRAST TECHNIQUE: Multidetector CT imaging of the chest was performed using the standard protocol during bolus administration of intravenous contrast. Multiplanar CT image reconstructions and MIPs were obtained to evaluate the vascular anatomy. CONTRAST:  62mL OMNIPAQUE IOHEXOL 350 MG/ML SOLN COMPARISON:  Chest x-ray earlier today. FINDINGS: Cardiovascular: Heart is normal size. Aorta is normal caliber. No filling defects in the pulmonary arteries to suggest pulmonary  emboli. Mediastinum/Nodes: No mediastinal, hilar, or axillary adenopathy. Lungs/Pleura: Lungs are clear. No focal airspace opacities or suspicious nodules. No effusions. Upper Abdomen: Imaging into the upper abdomen shows no acute findings. Prior cholecystectomy Musculoskeletal: Chest wall soft tissues are unremarkable. No acute bony abnormality. Review of the MIP images confirms the above findings. IMPRESSION: No evidence of pulmonary embolus. No acute cardiopulmonary disease. Electronically Signed   By: Rolm Baptise M.D.   On: 07/02/2018 00:54    Pending Labs Unresulted Labs (From admission, onward)  Start     Ordered   Signed and Held  HIV antibody (Routine Testing)  Once,   R     Signed and Held   Signed and Held  Troponin I - Now Then Q3H  Now then every 3 hours,   TIMED     Signed and Held   Signed and Held  CBC  (enoxaparin (LOVENOX)    CrCl >/= 30 ml/min)  Once,   R    Comments:  Baseline for enoxaparin therapy IF NOT ALREADY DRAWN.  Notify MD if PLT < 100 K.    Signed and Held   Signed and Held  Creatinine, serum  (enoxaparin (LOVENOX)    CrCl >/= 30 ml/min)  Once,   R    Comments:  Baseline for enoxaparin therapy IF NOT ALREADY DRAWN.    Signed and Held   Signed and Held  Creatinine, serum  (enoxaparin (LOVENOX)    CrCl >/= 30 ml/min)  Weekly,   R    Comments:  while on enoxaparin therapy    Signed and Held          Vitals/Pain Today's Vitals   07/02/18 0129 07/02/18 0130 07/02/18 0200 07/02/18 0230  BP:  138/60 132/60 (!) 141/55  Pulse:  90 84 86  Resp:      Temp:      TempSrc:      SpO2:  95% 96% 95%  Weight:      Height:      PainSc: 3        Isolation Precautions No active isolations  Medications Medications  sodium chloride 0.9 % bolus 1,000 mL (0 mLs Intravenous Stopped 07/01/18 2208)  ondansetron (ZOFRAN) injection 4 mg (4 mg Intravenous Given 07/01/18 2351)  aspirin chewable tablet 324 mg (324 mg Oral Given 07/02/18 0020)  morphine 2 MG/ML injection 2  mg (2 mg Intravenous Given 07/02/18 0020)  iohexol (OMNIPAQUE) 350 MG/ML injection 75 mL (75 mLs Intravenous Contrast Given 07/02/18 0043)    Mobility walks

## 2018-07-03 ENCOUNTER — Telehealth: Payer: Self-pay

## 2018-07-03 LAB — HIV ANTIBODY (ROUTINE TESTING W REFLEX): HIV Screen 4th Generation wRfx: NONREACTIVE

## 2018-07-03 LAB — HEMOGLOBIN A1C
Hgb A1c MFr Bld: 5.6 % (ref 4.8–5.6)
Mean Plasma Glucose: 114 mg/dL

## 2018-07-03 NOTE — Telephone Encounter (Signed)
Transition Care Management Follow-up Telephone Call  Date of discharge and from where: 07/02/18 [facility]  How have you been since you were released from the hospital? Pt states she has been doing okay. Had some pain in her back and slight tightness in her chest but it went away quickly and nothing like the pain she experienced before  Any questions or concerns? No   Items Reviewed:  Did the pt receive and understand the discharge instructions provided? Yes   Medications obtained and verified? Yes   Any new allergies since your discharge? No   Dietary orders reviewed? Yes  Do you have support at home? Yes   Functional Questionnaire: (I = Independent and D = Dependent) ADLs: I  Bathing/Dressing- I  Meal Prep- I  Eating- I  Maintaining continence- I  Transferring/Ambulation- i  Managing Meds- I  Follow up appointments reviewed:   PCP Hospital f/u appt confirmed? Yes  Scheduled to see Dr. Army Melia on 07/07/18 @ 3:20 pm.  [facility] f/u appt confirmed? Yes  Scheduled to see Dr. Sharolyn Douglas on 08/04/18  Are transportation arrangements needed? No   If their condition worsens, is the pt aware to call PCP or go to the Emergency Dept.? Yes  Was the patient provided with contact information for the PCP's office or ED? Yes  Was to pt encouraged to call back with questions or concerns? Yes

## 2018-07-03 NOTE — Telephone Encounter (Signed)
1st attempt to reach patient for TCM call and to confirm hospital follow up appt.   Left msg for pt to call back at [phone] or call the office.

## 2018-07-07 ENCOUNTER — Ambulatory Visit: Payer: 59 | Admitting: Internal Medicine

## 2018-07-07 ENCOUNTER — Other Ambulatory Visit: Payer: Self-pay

## 2018-07-07 ENCOUNTER — Encounter: Payer: Self-pay | Admitting: Internal Medicine

## 2018-07-07 VITALS — BP 110/64 | HR 75 | Ht 64.0 in | Wt 281.0 lb

## 2018-07-07 DIAGNOSIS — R079 Chest pain, unspecified: Secondary | ICD-10-CM | POA: Diagnosis not present

## 2018-07-07 DIAGNOSIS — I10 Essential (primary) hypertension: Secondary | ICD-10-CM

## 2018-07-07 DIAGNOSIS — R42 Dizziness and giddiness: Secondary | ICD-10-CM

## 2018-07-07 DIAGNOSIS — D519 Vitamin B12 deficiency anemia, unspecified: Secondary | ICD-10-CM | POA: Insufficient documentation

## 2018-07-07 DIAGNOSIS — G4726 Circadian rhythm sleep disorder, shift work type: Secondary | ICD-10-CM | POA: Diagnosis not present

## 2018-07-07 MED ORDER — NUVIGIL 200 MG PO TABS: 200.0000 mg | ORAL_TABLET | Freq: Every day | ORAL | 0 refills | Status: DC

## 2018-07-07 NOTE — Progress Notes (Signed)
Date:  07/07/2018   Name:  Samantha Mason   DOB:  March 27, 1951   MRN:  [id number]   Chief Complaint: No chief complaint on file. Hospital follow up.  Admitted with chest pain to [facility] 07/01/18 to 07/02/18.  She is here for one week follow up.  68 year old female with past medical history obstructive sleep apnea, restless leg syndrome, history of previous CVA, anxiety/depression, asthma, hypertension who presented to the hospital due to chest pain, shortness of breath.  1.  Chest pain/shortness of breath that given patient's risk factors she was observed overnight telemetry.  3 sets of cardiac markers were checked which were negative. - Patient had CT chest with contrast which showed no evidence of acute pulmonary embolism. -Patient also underwent a nuclear medicine stress test which was negative for acute ischemia.  She is clinically symptomatic and therefore being discharged.  2.  History of asthma-no acute exacerbation.  Patient will continue her Symbicort.  3.  Restless leg syndrome-patient will continue her Requip.    4.  Essential hypertension-continue lisinopril.  Pharmacological myocardial perfusion imaging study with no significant  ischemia Normal wall motion, EF estimated at 66% No EKG changes concerning for ischemia at peak stress or in recovery. Low risk scan  Lab Results  Component Value Date   CREATININE 1.09 (H) 07/02/2018   BUN 22 07/01/2018   NA 137 07/01/2018   K 3.9 07/01/2018   CL 104 07/01/2018   CO2 26 07/01/2018   Lab Results  Component Value Date   HGBA1C 5.6 07/02/2018    Chest Pain   This is a new problem. The current episode started in the past 7 days. The problem has been resolved. Associated symptoms include weakness (leg heaviness when walking). Pertinent negatives include no abdominal pain, cough, diaphoresis, dizziness, fever, nausea, palpitations or vomiting.  Her past medical history is significant for hypertension. Prior diagnostic  workup includes chest x-ray (and serial enzymes).  Hypertension  This is a chronic problem. The problem is unchanged. The problem is controlled. Pertinent negatives include no chest pain or palpitations. Past treatments include ACE inhibitors. The current treatment provides significant improvement.  Dizziness  This is a recurrent problem. The problem occurs every several days. Associated symptoms include arthralgias and weakness (leg heaviness when walking). Pertinent negatives include no abdominal pain, chest pain, chills, coughing, diaphoresis, fatigue, fever, joint swelling, nausea, vertigo, visual change or vomiting. Associated symptoms comments: Had a more severe episode associated with chest pain for which she was recently admitted.    Review of Systems  Constitutional: Negative for chills, diaphoresis, fatigue, fever and unexpected weight change.  HENT: Negative for ear pain, hearing loss, tinnitus and trouble swallowing.   Respiratory: Negative for cough, chest tightness and wheezing.   Cardiovascular: Negative for chest pain, palpitations and leg swelling.  Gastrointestinal: Negative for abdominal pain, diarrhea, nausea and vomiting.  Musculoskeletal: Positive for arthralgias and gait problem. Negative for joint swelling.  Neurological: Positive for weakness (leg heaviness when walking) and light-headedness. Negative for dizziness, vertigo, syncope and speech difficulty.  Psychiatric/Behavioral: Negative for dysphoric mood and sleep disturbance. The patient is not nervous/anxious.     Patient Active Problem List   Diagnosis Date Noted  . B12 deficiency anemia 07/07/2018  . Benign neoplasm of ascending colon   . Positive colorectal cancer screening using Cologuard test 03/13/2018  . Mild intermittent asthma without complication [id number]/41/[id number]  . Vitamin D deficiency 03/17/2017  . Prediabetes 03/14/2017  . Microcytic anemia 03/14/2017  .  IBS (irritable bowel syndrome) 03/12/2017  .  Allergic rhinitis 03/12/2017  . Restless leg syndrome 03/12/2017  . Acid reflux 10/16/2016  . Essential hypertension 10/16/2016  . OSA on CPAP 12/08/2014  . Obesity, morbid ([place]) 05/31/2014    Allergies  Allergen Reactions  . Penicillins Diarrhea and Nausea And Vomiting    Past Surgical History:  Procedure Laterality Date  . CHOLECYSTECTOMY    . COLONOSCOPY WITH PROPOFOL N/A 03/31/2018   Procedure: COLONOSCOPY WITH PROPOFOL;  Surgeon: Lucilla Lame, MD;  Location: [facility] ENDOSCOPY;  Service: Endoscopy;  Laterality: N/A;  . JOINT REPLACEMENT  2014  . saliva gland removal  1998  . TOTAL KNEE ARTHROPLASTY Left 2014  . TUBAL LIGATION Bilateral     Social History   Tobacco Use  . Smoking status: Never Smoker  . Smokeless tobacco: Never Used  Substance Use Topics  . Alcohol use: No    Alcohol/week: 0.0 standard drinks  . Drug use: No     Medication list has been reviewed and updated.  Current Meds  Medication Sig  . acetaminophen (TYLENOL) 500 MG tablet Take 2 tablets (1,000 mg total) by mouth every 8 (eight) hours as needed for moderate pain.  Marland Kitchen albuterol (ACCUNEB) 1.25 MG/3ML nebulizer solution Take 1 ampule by nebulization every 6 (six) hours as needed for wheezing.  . budesonide-formoterol (SYMBICORT) 160-4.5 MCG/ACT inhaler Inhale 2 puffs into the lungs 2 (two) times daily as needed.  . cetirizine (ZYRTEC) 10 MG tablet Take 1 tablet (10 mg total) by mouth daily.  . cholestyramine (QUESTRAN) 4 GM/DOSE powder MIX 4GM AND DRINK BY MOUTH DAILY.  . diphenoxylate-atropine (LOMOTIL) 2.5-0.025 MG tablet Take 1 tablet by mouth 3 (three) times daily as needed for diarrhea or loose stools.  Marland Kitchen lisinopril (PRINIVIL,ZESTRIL) 10 MG tablet TAKE 1 TABLET (10 MG TOTAL) BY MOUTH DAILY.  . NON FORMULARY CPAP at night  . pantoprazole (PROTONIX) 40 MG tablet Take 1 tablet (40 mg total) by mouth daily.  Marland Kitchen rOPINIRole (REQUIP) 1 MG tablet Take 1 tablet (1 mg total) by mouth at bedtime. (Patient  taking differently: Take 2 mg by mouth at bedtime. )    PHQ 2/9 Scores 07/07/2018 02/13/2018 03/12/2017 10/16/2016  PHQ - 2 Score 0 1 0 6  PHQ- 9 Score - - - 12    Physical Exam Vitals signs and nursing note reviewed.  Constitutional:      General: She is not in acute distress.    Appearance: She is well-developed.  HENT:     Head: Normocephalic and atraumatic.  Eyes:     Extraocular Movements:     Right eye: Nystagmus present.     Left eye: Nystagmus present.     Pupils: Pupils are equal, round, and reactive to light.     Comments: With lateral gaze to the right  Neck:     Musculoskeletal: Normal range of motion and neck supple.  Cardiovascular:     Rate and Rhythm: Normal rate and regular rhythm.     Pulses: Normal pulses.  Pulmonary:     Effort: Pulmonary effort is normal. No respiratory distress.     Breath sounds: Normal breath sounds.  Musculoskeletal: Normal range of motion.  Lymphadenopathy:     Cervical: No cervical adenopathy.  Skin:    General: Skin is warm and dry.     Findings: No rash.  Neurological:     Mental Status: She is alert and oriented to person, place, and time.  Psychiatric:  Behavior: Behavior normal.        Thought Content: Thought content normal.     BP 110/64   Pulse 75   Ht 5\' 4"  (1.626 m)   Wt 281 lb (127.5 kg)   SpO2 98%   BMI 48.23 kg/m   Assessment and Plan: 1. Vertigo Increase fluids, no medication for now - Ambulatory referral to ENT  2. Chest pain, unspecified type Resolved Myoview stress test was low suspicion No further intervention at this time Does need to work on weight loss with diet since unable to exercise  3. Essential hypertension controlled  4. Shift work sleep disorder Use as needed only - NUVIGIL 200 MG TABS; Take 200 mg by mouth daily.  Dispense: 10 tablet; Refill: 0   Partially dictated using Editor, commissioning. Any errors are unintentional.  Halina Maidens, MD [facility]  07/07/2018

## 2018-07-24 ENCOUNTER — Other Ambulatory Visit: Payer: Self-pay

## 2018-07-24 ENCOUNTER — Inpatient Hospital Stay
Admission: EM | Admit: 2018-07-24 | Discharge: 2018-07-26 | DRG: 190 | Disposition: A | Discharge status: Home / Self Care | Payer: 59 | Attending: Specialist | Admitting: Specialist

## 2018-07-24 ENCOUNTER — Encounter: Payer: Self-pay | Admitting: Emergency Medicine

## 2018-07-24 ENCOUNTER — Emergency Department: Payer: 59

## 2018-07-24 DIAGNOSIS — Z825 Family history of asthma and other chronic lower respiratory diseases: Secondary | ICD-10-CM

## 2018-07-24 DIAGNOSIS — Z6841 Body Mass Index (BMI) 40.0 and over, adult: Secondary | ICD-10-CM

## 2018-07-24 DIAGNOSIS — J9601 Acute respiratory failure with hypoxia: Secondary | ICD-10-CM | POA: Diagnosis present

## 2018-07-24 DIAGNOSIS — G2581 Restless legs syndrome: Secondary | ICD-10-CM | POA: Diagnosis present

## 2018-07-24 DIAGNOSIS — R05 Cough: Secondary | ICD-10-CM

## 2018-07-24 DIAGNOSIS — Z8673 Personal history of transient ischemic attack (TIA), and cerebral infarction without residual deficits: Secondary | ICD-10-CM | POA: Diagnosis not present

## 2018-07-24 DIAGNOSIS — J441 Chronic obstructive pulmonary disease with (acute) exacerbation: Principal | ICD-10-CM | POA: Diagnosis present

## 2018-07-24 DIAGNOSIS — Z7951 Long term (current) use of inhaled steroids: Secondary | ICD-10-CM

## 2018-07-24 DIAGNOSIS — K219 Gastro-esophageal reflux disease without esophagitis: Secondary | ICD-10-CM | POA: Diagnosis present

## 2018-07-24 DIAGNOSIS — I10 Essential (primary) hypertension: Secondary | ICD-10-CM | POA: Diagnosis present

## 2018-07-24 DIAGNOSIS — G4733 Obstructive sleep apnea (adult) (pediatric): Secondary | ICD-10-CM | POA: Diagnosis not present

## 2018-07-24 DIAGNOSIS — Z7722 Contact with and (suspected) exposure to environmental tobacco smoke (acute) (chronic): Secondary | ICD-10-CM | POA: Diagnosis present

## 2018-07-24 DIAGNOSIS — Z8249 Family history of ischemic heart disease and other diseases of the circulatory system: Secondary | ICD-10-CM | POA: Diagnosis not present

## 2018-07-24 DIAGNOSIS — F419 Anxiety disorder, unspecified: Secondary | ICD-10-CM | POA: Diagnosis not present

## 2018-07-24 DIAGNOSIS — J449 Chronic obstructive pulmonary disease, unspecified: Secondary | ICD-10-CM | POA: Diagnosis present

## 2018-07-24 DIAGNOSIS — R0602 Shortness of breath: Secondary | ICD-10-CM | POA: Diagnosis not present

## 2018-07-24 DIAGNOSIS — Z833 Family history of diabetes mellitus: Secondary | ICD-10-CM

## 2018-07-24 DIAGNOSIS — Z96652 Presence of left artificial knee joint: Secondary | ICD-10-CM | POA: Diagnosis present

## 2018-07-24 DIAGNOSIS — J45901 Unspecified asthma with (acute) exacerbation: Secondary | ICD-10-CM | POA: Diagnosis present

## 2018-07-24 DIAGNOSIS — R000 Tachycardia, unspecified: Secondary | ICD-10-CM | POA: Diagnosis not present

## 2018-07-24 DIAGNOSIS — R0600 Dyspnea, unspecified: Secondary | ICD-10-CM | POA: Diagnosis not present

## 2018-07-24 DIAGNOSIS — R059 Cough, unspecified: Secondary | ICD-10-CM

## 2018-07-24 HISTORY — DX: Chronic obstructive pulmonary disease, unspecified: J44.9

## 2018-07-24 LAB — CBC WITH DIFFERENTIAL/PLATELET
Abs Immature Granulocytes: 0.05 10*3/uL (ref 0.00–0.07)
Basophils Absolute: 0.1 10*3/uL (ref 0.0–0.1)
Basophils Relative: 0 %
EOS PCT: 1 %
Eosinophils Absolute: 0.1 10*3/uL (ref 0.0–0.5)
HCT: 33.8 % — ABNORMAL LOW (ref 36.0–46.0)
Hemoglobin: 10.6 g/dL — ABNORMAL LOW (ref 12.0–15.0)
Immature Granulocytes: 0 %
Lymphocytes Relative: 11 %
Lymphs Abs: 1.3 10*3/uL (ref 0.7–4.0)
MCH: 26.4 pg (ref 26.0–34.0)
MCHC: 31.4 g/dL (ref 30.0–36.0)
MCV: 84.3 fL (ref 80.0–100.0)
Monocytes Absolute: 0.7 10*3/uL (ref 0.1–1.0)
Monocytes Relative: 6 %
Neutro Abs: 10.3 10*3/uL — ABNORMAL HIGH (ref 1.7–7.7)
Neutrophils Relative %: 82 %
Platelets: 251 10*3/uL (ref 150–400)
RBC: 4.01 MIL/uL (ref 3.87–5.11)
RDW: 14 % (ref 11.5–15.5)
WBC: 12.6 10*3/uL — ABNORMAL HIGH (ref 4.0–10.5)
nRBC: 0 % (ref 0.0–0.2)

## 2018-07-24 LAB — COMPREHENSIVE METABOLIC PANEL
ALT: 13 U/L (ref 0–44)
AST: 17 U/L (ref 15–41)
Albumin: 3.6 g/dL (ref 3.5–5.0)
Alkaline Phosphatase: 63 U/L (ref 38–126)
Anion gap: 11 (ref 5–15)
BUN: 13 mg/dL (ref 8–23)
CO2: 27 mmol/L (ref 22–32)
Calcium: 9.5 mg/dL (ref 8.9–10.3)
Chloride: 101 mmol/L (ref 98–111)
Creatinine, Ser: 0.87 mg/dL (ref 0.44–1.00)
GFR calc Af Amer: >60 mL/min (ref >60)
GFR calc non Af Amer: >60 mL/min (ref >60)
Glucose, Bld: 121 mg/dL — ABNORMAL HIGH (ref 70–99)
Potassium: 4.3 mmol/L (ref 3.5–5.1)
Sodium: 139 mmol/L (ref 135–145)
Total Bilirubin: 0.5 mg/dL (ref 0.3–1.2)
Total Protein: 7.4 g/dL (ref 6.5–8.1)

## 2018-07-24 LAB — BRAIN NATRIURETIC PEPTIDE: B Natriuretic Peptide: 52 pg/mL (ref 0.0–100.0)

## 2018-07-24 LAB — TROPONIN I

## 2018-07-24 MED ORDER — HYDROCODONE-ACETAMINOPHEN 5-325 MG PO TABS: 1.0000 | ORAL_TABLET | ORAL | Status: DC | PRN

## 2018-07-24 MED ORDER — HYDROXYZINE HCL 10 MG PO TABS
10.0000 mg | ORAL_TABLET | Freq: Three times a day (TID) | ORAL | Status: DC | PRN
  Filled 2018-07-24: qty 1

## 2018-07-24 MED ORDER — IPRATROPIUM-ALBUTEROL 0.5-2.5 (3) MG/3ML IN SOLN
3.0000 mL | Freq: Four times a day (QID) | RESPIRATORY_TRACT | Status: DC
  Administered 2018-07-24: 3 mL via RESPIRATORY_TRACT
  Filled 2018-07-24: qty 3

## 2018-07-24 MED ORDER — METHYLPREDNISOLONE SODIUM SUCC 125 MG IJ SOLR
60.0000 mg | Freq: Four times a day (QID) | INTRAMUSCULAR | Status: DC
  Administered 2018-07-24 – 2018-07-25 (×2): 60 mg via INTRAVENOUS
  Filled 2018-07-24 (×2): qty 2

## 2018-07-24 MED ORDER — ROPINIROLE HCL 1 MG PO TABS
2.0000 mg | ORAL_TABLET | Freq: Every day | ORAL | Status: DC
  Administered 2018-07-24 – 2018-07-25 (×2): 2 mg via ORAL
  Filled 2018-07-24 (×2): qty 2

## 2018-07-24 MED ORDER — ENOXAPARIN SODIUM 40 MG/0.4ML ~~LOC~~ SOLN
40.0000 mg | Freq: Two times a day (BID) | SUBCUTANEOUS | Status: DC
  Administered 2018-07-24 – 2018-07-26 (×4): 40 mg via SUBCUTANEOUS
  Filled 2018-07-24 (×4): qty 0.4

## 2018-07-24 MED ORDER — IPRATROPIUM-ALBUTEROL 0.5-2.5 (3) MG/3ML IN SOLN
RESPIRATORY_TRACT | Status: AC
  Administered 2018-07-24: 3 mL
  Filled 2018-07-24: qty 3

## 2018-07-24 MED ORDER — MOMETASONE FURO-FORMOTEROL FUM 200-5 MCG/ACT IN AERO
2.0000 | INHALATION_SPRAY | Freq: Two times a day (BID) | RESPIRATORY_TRACT | Status: DC
  Administered 2018-07-24: 2 via RESPIRATORY_TRACT
  Filled 2018-07-24: qty 8.8

## 2018-07-24 MED ORDER — CHOLESTYRAMINE 4 G PO PACK
4.0000 g | PACK | Freq: Every day | ORAL | Status: DC
  Administered 2018-07-24: 22:00:00 4 g via ORAL
  Filled 2018-07-24 (×2): qty 1

## 2018-07-24 MED ORDER — IPRATROPIUM-ALBUTEROL 0.5-2.5 (3) MG/3ML IN SOLN
3.0000 mL | Freq: Once | RESPIRATORY_TRACT | Status: AC
  Administered 2018-07-24: 3 mL via RESPIRATORY_TRACT

## 2018-07-24 MED ORDER — LISINOPRIL 10 MG PO TABS
10.0000 mg | ORAL_TABLET | Freq: Every day | ORAL | Status: DC
  Administered 2018-07-24 – 2018-07-26 (×3): 10 mg via ORAL
  Filled 2018-07-24 (×3): qty 1

## 2018-07-24 MED ORDER — GUAIFENESIN ER 600 MG PO TB12
600.0000 mg | ORAL_TABLET | Freq: Two times a day (BID) | ORAL | Status: DC
  Administered 2018-07-25 – 2018-07-26 (×3): 600 mg via ORAL
  Filled 2018-07-24 (×4): qty 1

## 2018-07-24 MED ORDER — ALBUTEROL SULFATE (2.5 MG/3ML) 0.083% IN NEBU
2.5000 mg | INHALATION_SOLUTION | RESPIRATORY_TRACT | Status: DC | PRN
  Administered 2018-07-25: 01:00:00 2.5 mg via RESPIRATORY_TRACT
  Filled 2018-07-24: qty 3

## 2018-07-24 MED ORDER — IPRATROPIUM-ALBUTEROL 0.5-2.5 (3) MG/3ML IN SOLN
3.0000 mL | Freq: Once | RESPIRATORY_TRACT | Status: AC
  Administered 2018-07-24: 3 mL via RESPIRATORY_TRACT
  Filled 2018-07-24: qty 3

## 2018-07-24 MED ORDER — IPRATROPIUM-ALBUTEROL 0.5-2.5 (3) MG/3ML IN SOLN: 3.0000 mL | RESPIRATORY_TRACT | Status: DC | PRN

## 2018-07-24 MED ORDER — ARMODAFINIL 200 MG PO TABS: 200.0000 mg | ORAL_TABLET | Freq: Every day | ORAL | Status: DC

## 2018-07-24 MED ORDER — ONDANSETRON HCL 4 MG/2ML IJ SOLN: 4.0000 mg | Freq: Four times a day (QID) | INTRAMUSCULAR | Status: DC | PRN

## 2018-07-24 MED ORDER — ACETAMINOPHEN 500 MG PO TABS: 500.0000 mg | ORAL_TABLET | Freq: Three times a day (TID) | ORAL | Status: DC | PRN

## 2018-07-24 MED ORDER — IPRATROPIUM-ALBUTEROL 0.5-2.5 (3) MG/3ML IN SOLN
3.0000 mL | RESPIRATORY_TRACT | Status: DC
  Filled 2018-07-24: qty 3

## 2018-07-24 MED ORDER — DIPHENOXYLATE-ATROPINE 2.5-0.025 MG PO TABS: 1.0000 | ORAL_TABLET | Freq: Three times a day (TID) | ORAL | Status: DC | PRN

## 2018-07-24 MED ORDER — SODIUM CHLORIDE 0.45 % IV SOLN
INTRAVENOUS | Status: DC
  Administered 2018-07-24: via INTRAVENOUS

## 2018-07-24 MED ORDER — SODIUM CHLORIDE 0.9 % IV SOLN
100.0000 mg | Freq: Two times a day (BID) | INTRAVENOUS | Status: DC
  Administered 2018-07-25: 05:00:00 100 mg via INTRAVENOUS
  Filled 2018-07-24 (×3): qty 100

## 2018-07-24 MED ORDER — METHYLPREDNISOLONE SODIUM SUCC 125 MG IJ SOLR
125.0000 mg | Freq: Once | INTRAMUSCULAR | Status: AC
  Administered 2018-07-24: 125 mg via INTRAVENOUS
  Filled 2018-07-24: qty 2

## 2018-07-24 MED ORDER — LORATADINE 10 MG PO TABS
10.0000 mg | ORAL_TABLET | Freq: Every day | ORAL | Status: DC
  Administered 2018-07-25: 11:00:00 10 mg via ORAL
  Filled 2018-07-24 (×2): qty 1

## 2018-07-24 MED ORDER — ONDANSETRON HCL 4 MG PO TABS: 4.0000 mg | ORAL_TABLET | Freq: Four times a day (QID) | ORAL | Status: DC | PRN

## 2018-07-24 MED ORDER — POLYETHYLENE GLYCOL 3350 17 G PO PACK: 17.0000 g | PACK | Freq: Every day | ORAL | Status: DC | PRN

## 2018-07-24 MED ORDER — METOPROLOL TARTRATE 25 MG PO TABS
25.0000 mg | ORAL_TABLET | Freq: Two times a day (BID) | ORAL | Status: DC
  Administered 2018-07-24 – 2018-07-26 (×5): 25 mg via ORAL
  Filled 2018-07-24 (×5): qty 1

## 2018-07-24 MED ORDER — IPRATROPIUM-ALBUTEROL 0.5-2.5 (3) MG/3ML IN SOLN
3.0000 mL | RESPIRATORY_TRACT | Status: DC
  Administered 2018-07-24 – 2018-07-25 (×3): 3 mL via RESPIRATORY_TRACT
  Filled 2018-07-24 (×3): qty 3

## 2018-07-24 MED ORDER — PANTOPRAZOLE SODIUM 40 MG PO TBEC
40.0000 mg | DELAYED_RELEASE_TABLET | Freq: Every day | ORAL | Status: DC
  Administered 2018-07-25 – 2018-07-26 (×2): 40 mg via ORAL
  Filled 2018-07-24 (×2): qty 1

## 2018-07-24 NOTE — Progress Notes (Signed)
Patient seen for svn. Treatments were intially q2 hours. Found patient on 3 liter nasal cannula with o2 sat of 88 rr 26 hr 112; sitting at side of bed distressed and barely moving air bilaterally. svn given. Hr increased to 123 rr 24 and diffused wheezing throughout after tx.  o2 saturation increased to 93%. I placed patient on 40% venturi mask and asked for patient to at least be moved closer to nursing station due to difficulty breathing and status of breathing and consideration of a telemetry monitor if increase in svn treatments is ordered for this patient. Patient states her breathing is breathing with venturi at 40%. Will continue to monitor.

## 2018-07-24 NOTE — Progress Notes (Signed)
Anticoagulation monitoring(Lovenox):  67 o femaleordered Lovenox 40 mg Q12h  Filed Weights   07/24/18 1113  Weight: 283 lb (128.4 kg)   BMI 48.6  Lab Results  Component Value Date   CREATININE 0.87 07/24/2018   CREATININE 1.09 (H) 07/02/2018   CREATININE 1.19 (H) 07/01/2018   Estimated Creatinine Clearance: 84.5 mL/min (by C-G formula based on SCr of 0.87 mg/dL). Hemoglobin & Hematocrit     Component Value Date/Time   HGB 10.6 (L) 07/24/2018 1133   HGB 10.2 (L) 04/20/2014 1610   HCT 33.8 (L) 07/24/2018 1133   HCT 33.3 (L) 04/20/2014 1610     Per Protocol for Patient with estCrcl > 30 ml/min and BMI > 40, will transition to Lovenox 40 mg Q12h.

## 2018-07-24 NOTE — ED Notes (Signed)
Partient AAOx4 no SOB or DOE on 3L no Acute distress.

## 2018-07-24 NOTE — Progress Notes (Signed)
Family Meeting Note  Advance Directive:yes  Today a meeting took place with the Patient.  Patient is able to participate   The following clinical team members were present during this meeting:MD  The following were discussed:Patient's diagnosis: COPD exacerbation, Patient's progosis: Unable to determine and Goals for treatment: Full Code  Additional follow-up to be provided: prn  Time spent during discussion:20 minutes  Gorden Harms, MD

## 2018-07-24 NOTE — H&P (Signed)
[facility] NAME: Samantha Mason    MR#:  [id number]  DATE OF BIRTH:  05-13-1951  DATE OF ADMISSION:  07/24/2018  PRIMARY CARE PHYSICIAN: Glean Hess, MD   REQUESTING/REFERRING PHYSICIAN:   CHIEF COMPLAINT:   Chief Complaint  Patient presents with  . Shortness of Breath    HISTORY OF PRESENT ILLNESS: Samantha Mason  is a 68 y.o. female with a known history per below presenting from home with 1 day history of worsening shortness of breath, dyspnea on exertion, had a head cold a few days ago that went down in her chest per patient, generalized weakness, fatigue, chest tightness with deep breathing, in the emergency room patient was hypoxic, placed on oxygen via nasal cannula, white count was 12,000, chest x-ray negative, noted sinus tachycardia, patient valuated in the emergency room, no apparent distress, resting comfortably in bed, son is at the bedside, patient admitted for acute on COPD exacerbation   PAST MEDICAL HISTORY:   Past Medical History:  Diagnosis Date  . Acute bronchitis   . Anxiety and depression   . Asthma   . B12 deficiency 03/14/2017  . Chest pain 07/02/2018  . Family history of blood clots 07/16/2017  . Irritable bowel syndrome   . Malignant hypertension   . Obesity   . OSA (obstructive sleep apnea)   . Restless leg syndrome   . Stroke [facility])     PAST SURGICAL HISTORY:  Past Surgical History:  Procedure Laterality Date  . CHOLECYSTECTOMY    . COLONOSCOPY WITH PROPOFOL N/A 03/31/2018   Procedure: COLONOSCOPY WITH PROPOFOL;  Surgeon: Lucilla Lame, MD;  Location: [facility] ENDOSCOPY;  Service: Endoscopy;  Laterality: N/A;  . JOINT REPLACEMENT  2014  . saliva gland removal  1998  . TOTAL KNEE ARTHROPLASTY Left 2014  . TUBAL LIGATION Bilateral     SOCIAL HISTORY:  Social History   Tobacco Use  . Smoking status: Never Smoker  . Smokeless tobacco: Never Used  Substance Use Topics  . Alcohol use: No   Alcohol/week: 0.0 standard drinks    FAMILY HISTORY:  Family History  Problem Relation Age of Onset  . Dementia Mother   . COPD Father        84  . Diabetes Father   . Heart failure Brother        2010    DRUG ALLERGIES:  Allergies  Allergen Reactions  . Penicillins Diarrhea and Nausea And Vomiting    REVIEW OF SYSTEMS:   CONSTITUTIONAL: + Subjective fever, fatigue, weakness.  EYES: No blurred or double vision.  EARS, NOSE, AND THROAT: No tinnitus or ear pain.  RESPIRATORY: + cough, shortness of breath, wheezing  CARDIOVASCULAR: No chest pain, orthopnea, edema.  GASTROINTESTINAL: No nausea, vomiting, diarrhea or abdominal pain.  GENITOURINARY: No dysuria, hematuria.  ENDOCRINE: No polyuria, nocturia,  HEMATOLOGY: No anemia, easy bruising or bleeding SKIN: No rash or lesion. MUSCULOSKELETAL: No joint pain or arthritis.   NEUROLOGIC: No tingling, numbness, weakness.  PSYCHIATRY: No anxiety or depression.   MEDICATIONS AT HOME:  Prior to Admission medications   Medication Sig Start Date End Date Taking? Authorizing Provider  albuterol (ACCUNEB) 1.25 MG/3ML nebulizer solution Take 1 ampule by nebulization every 6 (six) hours as needed for wheezing.   Yes [provider]  cetirizine (ZYRTEC) 10 MG tablet Take 1 tablet (10 mg total) by mouth daily. 12/07/14  Yes Ashok Norris, MD  cholestyramine Lucrezia Starch) 4 GM/DOSE powder MIX 4GM  AND DRINK BY MOUTH DAILY. 03/20/18  Yes Glean Hess, MD  lisinopril (PRINIVIL,ZESTRIL) 10 MG tablet TAKE 1 TABLET (10 MG TOTAL) BY MOUTH DAILY. 05/26/18  Yes Glean Hess, MD  mometasone-formoterol (DULERA) 100-5 MCG/ACT AERO Inhale 2 puffs into the lungs 2 (two) times daily.   Yes [provider]  pantoprazole (PROTONIX) 40 MG tablet Take 1 tablet (40 mg total) by mouth daily. 03/27/18  Yes Glean Hess, MD  rOPINIRole (REQUIP) 1 MG tablet Take 1 tablet (1 mg total) by mouth at bedtime. Patient taking differently:  Take 2 mg by mouth at bedtime.  02/13/18  Yes Glean Hess, MD  acetaminophen (TYLENOL) 500 MG tablet Take 2 tablets (1,000 mg total) by mouth every 8 (eight) hours as needed for moderate pain. 04/09/17   Plonk, [name], MD  budesonide-formoterol (SYMBICORT) 160-4.5 MCG/ACT inhaler Inhale 2 puffs into the lungs 2 (two) times daily as needed.    [provider]  diphenoxylate-atropine (LOMOTIL) 2.5-0.025 MG tablet Take 1 tablet by mouth 3 (three) times daily as needed for diarrhea or loose stools. 10/16/16   Roselee Nova, MD  NUVIGIL 200 MG TABS Take 200 mg by mouth daily. 07/07/18   Glean Hess, MD      PHYSICAL EXAMINATION:   VITAL SIGNS: Blood pressure (!) 152/61, pulse (!) 116, temperature 99.6 F (37.6 C), temperature source Oral, resp. rate 20, height 5\' 4"  (1.626 m), weight 128.4 kg, SpO2 94 %.  GENERAL:  68 y.o.-year-old patient lying in the bed with no acute distress.  Morbid obesity EYES: Pupils equal, round, reactive to light and accommodation. No scleral icterus. Extraocular muscles intact.  HEENT: Head atraumatic, normocephalic. Oropharynx and nasopharynx clear.  NECK:  Supple, no jugular venous distention. No thyroid enlargement, no tenderness.  LUNGS: Diminished breath sounds with rhonchi bilaterally. No use of accessory muscles of respiration.  CARDIOVASCULAR: S1, S2 normal. No murmurs, rubs, or gallops.  ABDOMEN: Soft, nontender, nondistended. Bowel sounds present. No organomegaly or mass.  EXTREMITIES: No pedal edema, cyanosis, or clubbing.  NEUROLOGIC: Cranial nerves II through XII are intact. Muscle strength 5/5 in all extremities. Sensation intact. Gait not checked.  PSYCHIATRIC: The patient is alert and oriented x 3.  SKIN: No obvious rash, lesion, or ulcer.   LABORATORY PANEL:   CBC Recent Labs  Lab 07/24/18 1133  WBC 12.6*  HGB 10.6*  HCT 33.8*  PLT 251  MCV 84.3  MCH 26.4  MCHC 31.4  RDW 14.0  LYMPHSABS 1.3  MONOABS 0.7  EOSABS  0.1  BASOSABS 0.1   ------------------------------------------------------------------------------------------------------------------  Chemistries  Recent Labs  Lab 07/24/18 1133  NA 139  K 4.3  CL 101  CO2 27  GLUCOSE 121*  BUN 13  CREATININE 0.87  CALCIUM 9.5  AST 17  ALT 13  ALKPHOS 63  BILITOT 0.5   ------------------------------------------------------------------------------------------------------------------ estimated creatinine clearance is 84.5 mL/min (by C-G formula based on SCr of 0.87 mg/dL). ------------------------------------------------------------------------------------------------------------------ No results for input(s): TSH, T4TOTAL, T3FREE, THYROIDAB in the last 72 hours.  Invalid input(s): FREET3   Coagulation profile No results for input(s): INR, PROTIME in the last 168 hours. ------------------------------------------------------------------------------------------------------------------- No results for input(s): DDIMER in the last 72 hours. -------------------------------------------------------------------------------------------------------------------  Cardiac Enzymes Recent Labs  Lab 07/24/18 1133  TROPONINI <0.03   ------------------------------------------------------------------------------------------------------------------ Invalid input(s): POCBNP  ---------------------------------------------------------------------------------------------------------------  Urinalysis    Component Value Date/Time   COLORURINE Yellow 04/27/2012 0958   APPEARANCEUR Clear 04/27/2012 0958   LABSPEC 1.014 04/27/2012 2440  PHURINE 6.0 04/27/2012 0958   GLUCOSEU Negative 04/27/2012 0958   HGBUR 1+ 04/27/2012 0958   BILIRUBINUR neg 02/13/2018 0945   BILIRUBINUR Negative 04/27/2012 0958   KETONESUR Negative 04/27/2012 0958   PROTEINUR Negative 02/13/2018 0945   PROTEINUR Negative 04/27/2012 0958   UROBILINOGEN 0.2 02/13/2018 0945    NITRITE neg 02/13/2018 0945   NITRITE Negative 04/27/2012 0958   LEUKOCYTESUR Negative 02/13/2018 0945   LEUKOCYTESUR Negative 04/27/2012 0958     RADIOLOGY: Dg Chest Port 1 View  Result Date: 07/24/2018 CLINICAL DATA:  Short of breath. EXAM: PORTABLE CHEST 1 VIEW COMPARISON:  07/01/2018 FINDINGS: The heart size and mediastinal contours are within normal limits. Both lungs are clear. The visualized skeletal structures are unremarkable. IMPRESSION: No active disease. Electronically Signed   By: Kerby Moors M.D.   On: 07/24/2018 12:08    EKG: Orders placed or performed during the hospital encounter of 07/24/18  . ED EKG  . ED EKG  . EKG 12-Lead  . EKG 12-Lead    IMPRESSION AND PLAN: *Acute on COPD/asthmatic exacerbation Admit to regular nursing for bed, IV Solu-Medrol with tapering as tolerated, aggressive pulmonary toilet bronchodilator therapy, continue Dulera, mucolytic agents, empiric doxycycline for 5-day course, respiratory therapy to see, check sputum cultures, and wean O2 off as tolerated  *Chronic obstructive sleep apnea CPAP at bedtime/as needed-may use home device  *Chronic GAD, unspecified Vistaril as needed  *Acute sinus tachycardia Secondary to COPD exacerbation as well as mild dehydration Noted nuclear medicine stress testing done last month which was a normal study IV fluids for rehydration, low-dose beta-blocker therapy, and continue close medical monitoring  *Chronic hypertension Continue lisinopril, add beta-blocker therapy, vitals per routine, make changes as per necessary    All the records are reviewed and case discussed with ED provider. Management plans discussed with the patient, family and they are in agreement.  CODE STATUS:full Code Status History    Date Active Date Inactive Code Status Order ID Comments User Context   07/02/2018 0320 07/02/2018 1855 Full Code [id number]  Dion Sibal, Avel Peace, MD Inpatient       TOTAL TIME TAKING CARE OF THIS  PATIENT: 45 minutes.    Avel Peace Samantha Mason M.D on 07/24/2018   Between 7am to 6pm - Pager - [phone]  After 6pm go to [email or web address] - password [email or web address] [place] [facility]  Office  [phone]  CC: Primary care physician; Glean Hess, MD   Note: This dictation was prepared with Dragon dictation along with smaller phrase technology. Any transcriptional errors that result from this process are unintentional.

## 2018-07-24 NOTE — ED Notes (Signed)
Pt awake and alert still complains of shortness of breath. Patient exhibits pursed lip breathing on 3 L.

## 2018-07-24 NOTE — ED Notes (Signed)
RA sats 88-89%.  Patient placed back on 3L/ [place].  Sats improved to 93%

## 2018-07-24 NOTE — ED Provider Notes (Signed)
[facility] Provider Note   ____________________________________________    I have reviewed the triage vital signs and the nursing notes.   HISTORY  Chief Complaint Shortness of Breath     HPI Samantha Mason is a 68 y.o. female with a history of COPD who presents today with worsening shortness of breath.  Patient describes several days of worsening wheezing, shortness of breath especially with exertion.  Denies chest pain but does describe chest tightness.  No fevers or chills.  Positive dry cough.  Some body aches.  No nausea or vomiting or diaphoresis.  Is not take anything for this except for nebs at home which have only helped somewhat  Past Medical History:  Diagnosis Date  . Acute bronchitis   . Anxiety and depression   . Asthma   . B12 deficiency 03/14/2017  . Chest pain 07/02/2018  . Family history of blood clots 07/16/2017  . Irritable bowel syndrome   . Malignant hypertension   . Obesity   . OSA (obstructive sleep apnea)   . Restless leg syndrome   . Stroke [facility])     Patient Active Problem List   Diagnosis Date Noted  . COPD (chronic obstructive pulmonary disease) ([place]) 07/24/2018  . B12 deficiency anemia 07/07/2018  . Benign neoplasm of ascending colon   . Positive colorectal cancer screening using Cologuard test 03/13/2018  . Mild intermittent asthma without complication 56/25/6389  . Vitamin D deficiency 03/17/2017  . Prediabetes 03/14/2017  . Microcytic anemia 03/14/2017  . IBS (irritable bowel syndrome) 03/12/2017  . Allergic rhinitis 03/12/2017  . Restless leg syndrome 03/12/2017  . Acid reflux 10/16/2016  . Essential hypertension 10/16/2016  . OSA on CPAP 12/08/2014  . Obesity, morbid ([place]) 05/31/2014    Past Surgical History:  Procedure Laterality Date  . CHOLECYSTECTOMY    . COLONOSCOPY WITH PROPOFOL N/A 03/31/2018   Procedure: COLONOSCOPY WITH PROPOFOL;  Surgeon: Lucilla Lame, MD;   Location: [facility] ENDOSCOPY;  Service: Endoscopy;  Laterality: N/A;  . JOINT REPLACEMENT  2014  . saliva gland removal  1998  . TOTAL KNEE ARTHROPLASTY Left 2014  . TUBAL LIGATION Bilateral     Prior to Admission medications   Medication Sig Start Date End Date Taking? Authorizing Provider  albuterol (ACCUNEB) 1.25 MG/3ML nebulizer solution Take 1 ampule by nebulization every 6 (six) hours as needed for wheezing.   Yes [provider]  cetirizine (ZYRTEC) 10 MG tablet Take 1 tablet (10 mg total) by mouth daily. 12/07/14  Yes Ashok Norris, MD  cholestyramine Lucrezia Starch) 4 GM/DOSE powder MIX 4GM AND DRINK BY MOUTH DAILY. 03/20/18  Yes Glean Hess, MD  lisinopril (PRINIVIL,ZESTRIL) 10 MG tablet TAKE 1 TABLET (10 MG TOTAL) BY MOUTH DAILY. 05/26/18  Yes Glean Hess, MD  mometasone-formoterol (DULERA) 100-5 MCG/ACT AERO Inhale 2 puffs into the lungs 2 (two) times daily.   Yes [provider]  pantoprazole (PROTONIX) 40 MG tablet Take 1 tablet (40 mg total) by mouth daily. 03/27/18  Yes Glean Hess, MD  rOPINIRole (REQUIP) 1 MG tablet Take 1 tablet (1 mg total) by mouth at bedtime. Patient taking differently: Take 2 mg by mouth at bedtime.  02/13/18  Yes Glean Hess, MD  acetaminophen (TYLENOL) 500 MG tablet Take 2 tablets (1,000 mg total) by mouth every 8 (eight) hours as needed for moderate pain. 04/09/17   Plonk, [name], MD  budesonide-formoterol (SYMBICORT) 160-4.5 MCG/ACT inhaler Inhale 2 puffs into the lungs 2 (  two) times daily as needed.    [provider]  diphenoxylate-atropine (LOMOTIL) 2.5-0.025 MG tablet Take 1 tablet by mouth 3 (three) times daily as needed for diarrhea or loose stools. 10/16/16   Roselee Nova, MD  NUVIGIL 200 MG TABS Take 200 mg by mouth daily. 07/07/18   Glean Hess, MD     Allergies Penicillins  Family History  Problem Relation Age of Onset  . Dementia Mother   . COPD Father        31  . Diabetes Father    . Heart failure Brother        2010    Social History Social History   Tobacco Use  . Smoking status: Never Smoker  . Smokeless tobacco: Never Used  Substance Use Topics  . Alcohol use: No    Alcohol/week: 0.0 standard drinks  . Drug use: No    Review of Systems  Constitutional: No fever/chills Eyes: No visual changes.  ENT: No sore throat. Cardiovascular: As above Respiratory: As above Gastrointestinal: No abdominal pain.  No nausea, no vomiting.   Genitourinary: Negative for dysuria. Musculoskeletal: Negative for back pain. Skin: Negative for rash. Neurological: Negative for headaches or weakness   ____________________________________________   PHYSICAL EXAM:  VITAL SIGNS: ED Triage Vitals  Enc Vitals Group     BP 07/24/18 1117 (!) 177/82     Pulse Rate 07/24/18 1116 (!) 125     Resp 07/24/18 1116 (!) 32     Temp 07/24/18 1116 99.6 F (37.6 C)     Temp Source 07/24/18 1116 Oral     SpO2 07/24/18 1116 (!) 89 %     Weight 07/24/18 1113 128.4 kg (283 lb)     Height 07/24/18 1113 1.626 m (5\' 4" )     Head Circumference --      Peak Flow --      Pain Score 07/24/18 1112 9     Pain Loc --      Pain Edu? --      Excl. in [place]? --     Constitutional: Alert and oriented.  Eyes: Conjunctivae are normal.   Nose: No congestion/rhinnorhea. Mouth/Throat: Mucous membranes are moist.    Cardiovascular: Tachycardia regular rhythm. Grossly normal heart sounds.  Good peripheral circulation. Respiratory: Increased respiratory effort with tachypnea, no retractions, diffuse wheezing Gastrointestinal: Soft and nontender. No distention.  No CVA tenderness. Genitourinary: deferred Musculoskeletal: No lower extremity tenderness nor edema.  Warm and well perfused Neurologic:  Normal speech and language. No gross focal neurologic deficits are appreciated.  Skin:  Skin is warm, dry and intact. No rash noted.   ____________________________________________   LABS (all labs  ordered are listed, but only abnormal results are displayed)  Labs Reviewed  CBC WITH DIFFERENTIAL/PLATELET - Abnormal; Notable for the following components:      Result Value   WBC 12.6 (*)    Hemoglobin 10.6 (*)    HCT 33.8 (*)    Neutro Abs 10.3 (*)    All other components within normal limits  COMPREHENSIVE METABOLIC PANEL - Abnormal; Notable for the following components:   Glucose, Bld 121 (*)    All other components within normal limits  BRAIN NATRIURETIC PEPTIDE  TROPONIN I   ____________________________________________  EKG  ED ECG REPORT I, Lavonia Drafts, the attending physician, personally viewed and interpreted this ECG.  Date: 07/24/2018  Rhythm: Sinus tachycardia QRS Axis: normal Intervals: normal ST/T Wave abnormalities: Nonspecific changes Narrative Interpretation: no evidence of  acute ischemia  ____________________________________________  RADIOLOGY  X-ray negative for pneumonia ____________________________________________   PROCEDURES  Procedure(s) performed: No  Procedures   Critical Care performed:yes  CRITICAL CARE Performed by: Lavonia Drafts   Total critical care time: 30 minutes  Critical care time was exclusive of separately billable procedures and treating other patients.  Critical care was necessary to treat or prevent imminent or life-threatening deterioration.  Critical care was time spent personally by me on the following activities: development of treatment plan with patient and/or surrogate as well as nursing, discussions with consultants, evaluation of patient's response to treatment, examination of patient, obtaining history from patient or surrogate, ordering and performing treatments and interventions, ordering and review of laboratory studies, ordering and review of radiographic studies, pulse oximetry and re-evaluation of patient's condition.  ____________________________________________   INITIAL IMPRESSION /  ASSESSMENT AND PLAN / ED COURSE  Pertinent labs & imaging results that were available during my care of the patient were reviewed by me and considered in my medical decision making (see chart for details).  Patient presents with significant shortness of breath with tachypnea, she is tachycardic and obviously working to breathe.  IV Solu-Medrol ordered, multiple duo nebs, placed on a cardiac monitor, we will watch carefully in case she requires BiPAP  Lab work is overall reassuring, no evidence of pneumonia.  Patient's breathing has improved somewhat however she is still wheezing diffusely, she will require admission for further management    ____________________________________________   FINAL CLINICAL IMPRESSION(S) / ED DIAGNOSES  Final diagnoses:  COPD exacerbation ([place])        Note:  This document was prepared using Dragon voice recognition software and may include unintentional dictation errors.   Lavonia Drafts, MD 07/24/18 [id number]

## 2018-07-24 NOTE — ED Notes (Signed)
ED TO INPATIENT HANDOFF REPORT  ED Nurse Name and Phone #: George Ina [phone]  S Name/Age/Gender Samantha Mason 68 y.o. female Room/Bed: ED06A/ED06A  Code Status   Code Status: Prior  Home/SNF/Other Home Patient oriented to: self, place, time and situation Is this baseline? Yes   Triage Complete: Triage complete  Chief Complaint Difficutly Breathing   Triage Note Started with [facility] over night. 4 neb at home. Labored. Wheezing. Hypoxia.    Allergies Allergies  Allergen Reactions  . Penicillins Diarrhea and Nausea And Vomiting    Level of Care/Admitting Diagnosis ED Disposition    ED Disposition Condition [facility] Area: [place] [[id number]]  Level of Care: Med-Surg [16]  Diagnosis: COPD (chronic obstructive pulmonary disease) [facility]) [[id number]]  Admitting Physician: Gorden Harms [[id number]]  Attending Physician: Gorden Harms [[id number]]  Estimated length of stay: past midnight tomorrow  Certification:: I certify this patient will need inpatient services for at least 2 midnights  PT Class (Do Not Modify): Inpatient [101]  PT Acc Code (Do Not Modify): Private [1]       B Medical/Surgery History Past Medical History:  Diagnosis Date  . Acute bronchitis   . Anxiety and depression   . Asthma   . B12 deficiency 03/14/2017  . Chest pain 07/02/2018  . Family history of blood clots 07/16/2017  . Irritable bowel syndrome   . Malignant hypertension   . Obesity   . OSA (obstructive sleep apnea)   . Restless leg syndrome   . Stroke [facility])    Past Surgical History:  Procedure Laterality Date  . CHOLECYSTECTOMY    . COLONOSCOPY WITH PROPOFOL N/A 03/31/2018   Procedure: COLONOSCOPY WITH PROPOFOL;  Surgeon: Lucilla Lame, MD;  Location: [facility] ENDOSCOPY;  Service: Endoscopy;  Laterality: N/A;  . JOINT REPLACEMENT  2014  . saliva gland removal  1998  . TOTAL KNEE ARTHROPLASTY Left 2014  . TUBAL LIGATION Bilateral      A IV  Location/Drains/Wounds Patient Lines/Drains/Airways Status   Active Line/Drains/Airways    Name:   Placement date:   Placement time:   Site:   Days:   Peripheral IV 07/24/18 Right Antecubital   07/24/18    1130    Antecubital   less than 1          Intake/Output Last 24 hours No intake or output data in the 24 hours ending 07/24/18 1424  Labs/Imaging Results for orders placed or performed during the hospital encounter of 07/24/18 (from the past 48 hour(s))  Brain natriuretic peptide     Status: None   Collection Time: 07/24/18 11:33 AM  Result Value Ref Range   B Natriuretic Peptide 52.0 0.0 - 100.0 pg/mL    Comment: Performed at [facility], [address] [place], [place] [address]  CBC with Differential     Status: Abnormal   Collection Time: 07/24/18 11:33 AM  Result Value Ref Range   WBC 12.6 (H) 4.0 - 10.5 K/uL   RBC 4.01 3.87 - 5.11 MIL/uL   Hemoglobin 10.6 (L) 12.0 - 15.0 g/dL   HCT 33.8 (L) 36.0 - 46.0 %   MCV 84.3 80.0 - 100.0 fL   MCH 26.4 26.0 - 34.0 pg   MCHC 31.4 30.0 - 36.0 g/dL   RDW 14.0 11.5 - 15.5 %   Platelets 251 150 - 400 K/uL   nRBC 0.0 0.0 - 0.2 %   Neutrophils Relative % 82 %   Neutro  Abs 10.3 (H) 1.7 - 7.7 K/uL   Lymphocytes Relative 11 %   Lymphs Abs 1.3 0.7 - 4.0 K/uL   Monocytes Relative 6 %   Monocytes Absolute 0.7 0.1 - 1.0 K/uL   Eosinophils Relative 1 %   Eosinophils Absolute 0.1 0.0 - 0.5 K/uL   Basophils Relative 0 %   Basophils Absolute 0.1 0.0 - 0.1 K/uL   Immature Granulocytes 0 %   Abs Immature Granulocytes 0.05 0.00 - 0.07 K/uL    Comment: Performed at [facility], [address] [place], [place] [address]  Comprehensive metabolic panel     Status: Abnormal   Collection Time: 07/24/18 11:33 AM  Result Value Ref Range   Sodium 139 135 - 145 mmol/L   Potassium 4.3 3.5 - 5.1 mmol/L   Chloride 101 98 - 111 mmol/L   CO2 27 22 - 32 mmol/L   Glucose, Bld 121 (H) 70 - 99 mg/dL   BUN 13 8 - 23 mg/dL    Creatinine, Ser 0.87 0.44 - 1.00 mg/dL   Calcium 9.5 8.9 - 10.3 mg/dL   Total Protein 7.4 6.5 - 8.1 g/dL   Albumin 3.6 3.5 - 5.0 g/dL   AST 17 15 - 41 U/L   ALT 13 0 - 44 U/L   Alkaline Phosphatase 63 38 - 126 U/L   Total Bilirubin 0.5 0.3 - 1.2 mg/dL   GFR calc non Af Amer >60 >60 mL/min   GFR calc Af Amer >60 >60 mL/min   Anion gap 11 5 - 15    Comment: Performed at [facility], [address] [place], [place] [address]  Troponin I - ONCE - STAT     Status: None   Collection Time: 07/24/18 11:33 AM  Result Value Ref Range   Troponin I <0.03 <0.03 ng/mL    Comment: Performed at [facility], [address], [place], [place] [address]   Dg Chest Port 1 View  Result Date: 07/24/2018 CLINICAL DATA:  Short of breath. EXAM: PORTABLE CHEST 1 VIEW COMPARISON:  07/01/2018 FINDINGS: The heart size and mediastinal contours are within normal limits. Both lungs are clear. The visualized skeletal structures are unremarkable. IMPRESSION: No active disease. Electronically Signed   By: Kerby Moors M.D.   On: 07/24/2018 12:08    Pending Labs Unresulted Labs (From admission, onward)    Start     Ordered   Signed and Held  CBC  (enoxaparin (LOVENOX)    CrCl >/= 30 ml/min)  Once,   R    Comments:  Baseline for enoxaparin therapy IF NOT ALREADY DRAWN.  Notify MD if PLT < 100 K.    Signed and Held   Signed and Held  Creatinine, serum  (enoxaparin (LOVENOX)    CrCl >/= 30 ml/min)  Once,   R    Comments:  Baseline for enoxaparin therapy IF NOT ALREADY DRAWN.    Signed and Held   Signed and Held  Creatinine, serum  (enoxaparin (LOVENOX)    CrCl >/= 30 ml/min)  Weekly,   R    Comments:  while on enoxaparin therapy    Signed and Held   Signed and Held  Culture, sputum-assessment  Once,   R     Signed and Held          Vitals/Pain Today's Vitals   07/24/18 1245 07/24/18 1300 07/24/18 1330 07/24/18 1408  BP:  (!) 156/76 (!) 152/61   Pulse: (!) 117  (!) 116  Resp: 20 (!)  22 20   Temp:      TempSrc:      SpO2: (!) 89%  94%   Weight:      Height:      PainSc:    6     Isolation Precautions Droplet precaution  Medications Medications  ipratropium-albuterol (DUONEB) 0.5-2.5 (3) MG/3ML nebulizer solution (  Not Given 07/24/18 1420)  metoprolol tartrate (LOPRESSOR) tablet 25 mg (has no administration in time range)  ipratropium-albuterol (DUONEB) 0.5-2.5 (3) MG/3ML nebulizer solution 3 mL (3 mLs Nebulization Given 07/24/18 1126)  ipratropium-albuterol (DUONEB) 0.5-2.5 (3) MG/3ML nebulizer solution 3 mL (3 mLs Nebulization Given 07/24/18 1126)  methylPREDNISolone sodium succinate (SOLU-MEDROL) 125 mg/2 mL injection 125 mg (125 mg Intravenous Given 07/24/18 1131)  ipratropium-albuterol (DUONEB) 0.5-2.5 (3) MG/3ML nebulizer solution 3 mL (3 mLs Nebulization Given 07/24/18 1321)    Mobility walks Low fall risk   Focused Assessments Pulmonary Assessment Handoff:  Lung sounds: Bilateral Breath Sounds: Diminished, Expiratory wheezes L Breath Sounds: Diminished, Expiratory wheezes R Breath Sounds: Diminished, Expiratory wheezes O2 Device: Nasal Cannula O2 Flow Rate (L/min): 3 L/min      R Recommendations: See Admitting Provider Note  Report given to:   Additional Notes:

## 2018-07-24 NOTE — ED Triage Notes (Signed)
Started with [facility] over night. 4 neb at home. Labored. Wheezing. Hypoxia.

## 2018-07-25 ENCOUNTER — Inpatient Hospital Stay: Payer: 59

## 2018-07-25 LAB — BLOOD GAS, ARTERIAL
Acid-Base Excess: 5.6 mmol/L — ABNORMAL HIGH (ref 0.0–2.0)
Bicarbonate: 29.8 mmol/L — ABNORMAL HIGH (ref 20.0–28.0)
FIO2: 0.4
O2 Saturation: 96.8 %
Patient temperature: 37
pCO2 arterial: 41 mmHg (ref 32.0–48.0)
pH, Arterial: 7.47 — ABNORMAL HIGH (ref 7.350–7.450)
pO2, Arterial: 83 mmHg (ref 83.0–108.0)

## 2018-07-25 MED ORDER — ARMODAFINIL 200 MG PO TABS: 200.0000 mg | ORAL_TABLET | Freq: Every day | ORAL | Status: DC | PRN

## 2018-07-25 MED ORDER — CHOLESTYRAMINE LIGHT 4 G PO PACK
4.0000 g | PACK | Freq: Every day | ORAL | Status: DC
  Filled 2018-07-25: qty 1

## 2018-07-25 MED ORDER — FUROSEMIDE 10 MG/ML IJ SOLN
20.0000 mg | Freq: Once | INTRAMUSCULAR | Status: DC
  Filled 2018-07-25: qty 2

## 2018-07-25 MED ORDER — IPRATROPIUM-ALBUTEROL 0.5-2.5 (3) MG/3ML IN SOLN
3.0000 mL | RESPIRATORY_TRACT | Status: DC
  Administered 2018-07-25 – 2018-07-26 (×7): 3 mL via RESPIRATORY_TRACT
  Filled 2018-07-25 (×7): qty 3

## 2018-07-25 MED ORDER — IPRATROPIUM-ALBUTEROL 0.5-2.5 (3) MG/3ML IN SOLN: 3.0000 mL | Freq: Four times a day (QID) | RESPIRATORY_TRACT | Status: DC

## 2018-07-25 MED ORDER — CHOLESTYRAMINE LIGHT 4 G PO PACK
4.0000 g | PACK | Freq: Every day | ORAL | Status: DC
  Administered 2018-07-25: 4 g via ORAL
  Filled 2018-07-25 (×2): qty 1

## 2018-07-25 MED ORDER — BUDESONIDE 0.5 MG/2ML IN SUSP
0.5000 mg | Freq: Two times a day (BID) | RESPIRATORY_TRACT | Status: DC
  Administered 2018-07-25 – 2018-07-26 (×2): 0.5 mg via RESPIRATORY_TRACT
  Filled 2018-07-25 (×2): qty 2

## 2018-07-25 MED ORDER — DOXYCYCLINE HYCLATE 100 MG PO TABS
100.0000 mg | ORAL_TABLET | Freq: Two times a day (BID) | ORAL | Status: DC
  Administered 2018-07-25 – 2018-07-26 (×3): 100 mg via ORAL
  Filled 2018-07-25 (×3): qty 1

## 2018-07-25 MED ORDER — CHOLESTYRAMINE LIGHT 4 G PO PACK: 4.0000 g | PACK | Freq: Every day | ORAL | Status: DC

## 2018-07-25 MED ORDER — METHYLPREDNISOLONE SODIUM SUCC 125 MG IJ SOLR
60.0000 mg | Freq: Two times a day (BID) | INTRAMUSCULAR | Status: DC
  Administered 2018-07-25 – 2018-07-26 (×2): 60 mg via INTRAVENOUS
  Filled 2018-07-25 (×2): qty 2

## 2018-07-25 NOTE — Progress Notes (Signed)
PT c/o "just not feel the same", "something's different doesn't feel right" Contacted on call MD whom ordered ABG and discontinuation of fluids. Fluids stopped. Pt requesting to see pulmonologist. MD is aware of request. Per MD once have ABG results will see if we need to more up a level of care/ BiPAP whereby e-link pulmonologist may consult.

## 2018-07-25 NOTE — Progress Notes (Signed)
Patient's breathing is much more improved on venturi mask. Not feeling as sob or tight in chest but continues to have persistent cough.  Resting saturation is now in the 90s vs over 110 when initially seen. She is now on a telemetry monitor. Ambulating to bathroom was very hard for her so staff provided bedside commode for now.  Continues to open up nicely after duoneb tx. Will continue to monitor.

## 2018-07-25 NOTE — Progress Notes (Signed)
Patient has since been placed on bipap after abg and cxr. She is resting well and able to sleep. Breathing tx given in line with bipap. Wheezing remains. Will continue to monitor.

## 2018-07-25 NOTE — Progress Notes (Signed)
[place] [facility] [place] NAME: Samantha Mason    MR#:  [id number]  DATE OF BIRTH:  1950/10/02  SUBJECTIVE:   She presented to the hospital due to shortness of breath, wheezing and noted to be in asthma/COPD exacerbation.  Patient says she feels a lot better since yesterday.   REVIEW OF SYSTEMS:    Review of Systems  Constitutional: Negative for chills and fever.  HENT: Negative for congestion and tinnitus.   Eyes: Negative for blurred vision and double vision.  Respiratory: Positive for shortness of breath and wheezing. Negative for cough.   Cardiovascular: Negative for chest pain, orthopnea and PND.  Gastrointestinal: Negative for abdominal pain, diarrhea, nausea and vomiting.  Genitourinary: Negative for dysuria and hematuria.  Neurological: Negative for dizziness, sensory change and focal weakness.  All other systems reviewed and are negative.   Nutrition: Regular diet Tolerating Diet: Y es Tolerating PT: Ambulatory  DRUG ALLERGIES:   Allergies  Allergen Reactions  . Penicillins Diarrhea and Nausea And Vomiting    VITALS:  Blood pressure (!) 161/75, pulse (!) 102, temperature 98.3 F (36.8 C), temperature source Oral, resp. rate (!) 22, height 5\' 4"  (1.626 m), weight 128.4 kg, SpO2 95 %.  PHYSICAL EXAMINATION:   Physical Exam  GENERAL:  68 y.o.-year-old obese patient lying in bed in no acute distress.  EYES: Pupils equal, round, reactive to light and accommodation. No scleral icterus. Extraocular muscles intact.  HEENT: Head atraumatic, normocephalic. Oropharynx and nasopharynx clear.  NECK:  Supple, no jugular venous distention. No thyroid enlargement, no tenderness.  LUNGS: Good air entry bilaterally, diffuse expiratory wheezing bilaterally, no rales, rhonchi CARDIOVASCULAR: S1, S2 normal. No murmurs, rubs, or gallops.  ABDOMEN: Soft, nontender, nondistended. Bowel sounds present. No organomegaly or mass.  EXTREMITIES:  No cyanosis, clubbing or edema b/l.    NEUROLOGIC: Cranial nerves II through XII are intact. No focal Motor or sensory deficits b/l.   PSYCHIATRIC: The patient is alert and oriented x 3.  SKIN: No obvious rash, lesion, or ulcer.    LABORATORY PANEL:   CBC Recent Labs  Lab 07/24/18 1133  WBC 12.6*  HGB 10.6*  HCT 33.8*  PLT 251   ------------------------------------------------------------------------------------------------------------------  Chemistries  Recent Labs  Lab 07/24/18 1133  NA 139  K 4.3  CL 101  CO2 27  GLUCOSE 121*  BUN 13  CREATININE 0.87  CALCIUM 9.5  AST 17  ALT 13  ALKPHOS 63  BILITOT 0.5   ------------------------------------------------------------------------------------------------------------------  Cardiac Enzymes Recent Labs  Lab 07/24/18 1133  TROPONINI <0.03   ------------------------------------------------------------------------------------------------------------------  RADIOLOGY:  Dg Chest Port 1 View  Result Date: 07/25/2018 CLINICAL DATA:  Labored breathing tonight.  History of bronchitis. EXAM: PORTABLE CHEST 1 VIEW COMPARISON:  Chest radiograph July 24, 2018 FINDINGS: Cardiomediastinal silhouette is normal. Mildly calcified aortic arch. Bronchitic changes out pleural effusion or focal consolidation. No pneumothorax. Soft tissue planes and included osseous structures are non suspicious. IMPRESSION: 1. Bronchitic changes without focal consolidation. 2.  Aortic Atherosclerosis (ICD10-I70.0). Electronically Signed   By: Elon Alas M.D.   On: 07/25/2018 01:50   Dg Chest Port 1 View  Result Date: 07/24/2018 CLINICAL DATA:  Short of breath. EXAM: PORTABLE CHEST 1 VIEW COMPARISON:  07/01/2018 FINDINGS: The heart size and mediastinal contours are within normal limits. Both lungs are clear. The visualized skeletal structures are unremarkable. IMPRESSION: No active disease. Electronically Signed   By: Kerby Moors M.D.   On:  07/24/2018  12:08     ASSESSMENT AND PLAN:   68 year old female with past medical history of asthma, secondhand smoke exposure, restless leg syndrome, history of previous CVA, hypertension, obesity, anxiety/depression who presented to the hospital due to shortness of breath and noted to be in acute respiratory failure with hypoxia.  1.  Acute respiratory failure with hypoxia- secondary to COPD/asthma exacerbation. -Continue O2 supplementation, was on BiPAP initially but now weaned off.  Overall feels much better. -Continue IV steroids, scheduled duo nebs, Pulmicort nebs.  2.  COPD/asthma exacerbation source of patient's worsening shortness of breath. - Continue IV steroids, scheduled duo nebs, Pulmicort nebs. Chest x-ray was negative for acute pneumonia.  Continue oral doxycycline.  3.  Essential hypertension-continue lisinopril, metoprolol.  4.  Restless leg syndrome-continue Requip.  5.  GERD-continue Protonix.    All the records are reviewed and case discussed with Care Management/Social Worker. Management plans discussed with the patient, family and they are in agreement.  CODE STATUS: Full code  DVT Prophylaxis: Lovenox  TOTAL TIME TAKING CARE OF THIS PATIENT: 30 minutes.   POSSIBLE D/C IN 1-2 DAYS, DEPENDING ON CLINICAL CONDITION.   Henreitta Leber M.D on 07/25/2018 at 1:27 PM  Between 7am to 6pm - Pager - [phone]  After 6pm go to [email or web address] - Proofreader  [facility]  Office  [phone]  CC: Primary care physician; Glean Hess, MD

## 2018-07-26 MED ORDER — GUAIFENESIN-DM 100-10 MG/5ML PO SYRP: 5.0000 mL | ORAL_SOLUTION | ORAL | 0 refills | Status: DC | PRN

## 2018-07-26 MED ORDER — DOXYCYCLINE HYCLATE 100 MG PO TABS: 100.0000 mg | ORAL_TABLET | Freq: Two times a day (BID) | ORAL | 0 refills | Status: AC

## 2018-07-26 MED ORDER — PREDNISONE 10 MG PO TABS: ORAL_TABLET | ORAL | 0 refills | Status: DC

## 2018-07-26 MED ORDER — GUAIFENESIN-DM 100-10 MG/5ML PO SYRP: 5.0000 mL | ORAL_SOLUTION | ORAL | Status: DC | PRN

## 2018-07-26 NOTE — Discharge Summary (Signed)
[facility] NAME: Samantha Mason    MR#:  [id number]  DATE OF BIRTH:  1950-06-26  DATE OF ADMISSION:  07/24/2018 ADMITTING PHYSICIAN: Gorden Harms, MD  DATE OF DISCHARGE: 07/26/2018  PRIMARY CARE PHYSICIAN: Glean Hess, MD    ADMISSION DIAGNOSIS:  COPD exacerbation ([place]) [J44.1]  DISCHARGE DIAGNOSIS:  Active Problems:   COPD (chronic obstructive pulmonary disease) ([place])   SECONDARY DIAGNOSIS:   Past Medical History:  Diagnosis Date  . Acute bronchitis   . Anxiety and depression   . Asthma   . B12 deficiency 03/14/2017  . Chest pain 07/02/2018  . Family history of blood clots 07/16/2017  . Irritable bowel syndrome   . Malignant hypertension   . Obesity   . OSA (obstructive sleep apnea)   . Restless leg syndrome   . Stroke [facility])     HOSPITAL COURSE:   68 year old female with past medical history of asthma, secondhand smoke exposure, restless leg syndrome, history of previous CVA, hypertension, obesity, anxiety/depression who presented to the hospital due to shortness of breath and noted to be in acute respiratory failure with hypoxia.  1.  Acute respiratory failure with hypoxia- secondary to COPD/asthma exacerbation. -Patient was treated with IV steroids scheduled duo nebs, Pulmicort nebs and given empiric doxycycline.  Patient also initially was placed on BiPAP.  She has improved and now has been weaned off of BiPAP.  She ambulates and is no longer hypoxic.  She is being discharged home on a prednisone taper and empiric doxycycline with maintenance of her inhalers and nebulizer treatments.  2.  COPD/asthma exacerbation source of patient's worsening shortness of breath. -Improved with therapy as mentioned above.  Patient's chest x-ray was negative for acute pneumonia.  She is now being discharged on oral prednisone taper and empiric antibiotics.  She will continue her maintenance inhalers as stated below. -She was  advised to seek out a pulmonologist to get outpatient PFTs done.  3.  Essential hypertension- she will continue lisinopril, metoprolol.  4.  Restless leg syndrome- she will continue Requip.  5.  GERD- she will continue Protonix.  DISCHARGE CONDITIONS:   Stable   CONSULTS OBTAINED:    DRUG ALLERGIES:   Allergies  Allergen Reactions  . Penicillins Diarrhea and Nausea And Vomiting    DISCHARGE MEDICATIONS:   Allergies as of 07/26/2018      Reactions   Penicillins Diarrhea, Nausea And Vomiting      Medication List    TAKE these medications   acetaminophen 500 MG tablet Commonly known as:  TYLENOL Take 2 tablets (1,000 mg total) by mouth every 8 (eight) hours as needed for moderate pain.   albuterol 1.25 MG/3ML nebulizer solution Commonly known as:  ACCUNEB Take 1 ampule by nebulization every 6 (six) hours as needed for wheezing.   budesonide-formoterol 160-4.5 MCG/ACT inhaler Commonly known as:  SYMBICORT Inhale 2 puffs into the lungs 2 (two) times daily as needed.   cetirizine 10 MG tablet Commonly known as:  ZYRTEC Take 1 tablet (10 mg total) by mouth daily.   cholestyramine 4 GM/DOSE powder Commonly known as:  QUESTRAN MIX 4GM AND DRINK BY MOUTH DAILY.   diphenoxylate-atropine 2.5-0.025 MG tablet Commonly known as:  Lomotil Take 1 tablet by mouth 3 (three) times daily as needed for diarrhea or loose stools.   doxycycline 100 MG tablet Commonly known as:  VIBRA-TABS Take 1 tablet (100 mg total) by mouth every 12 (twelve) hours for  5 days.   guaiFENesin-dextromethorphan 100-10 MG/5ML syrup Commonly known as:  ROBITUSSIN DM Take 5 mLs by mouth every 4 (four) hours as needed for cough.   lisinopril 10 MG tablet Commonly known as:  PRINIVIL,ZESTRIL TAKE 1 TABLET (10 MG TOTAL) BY MOUTH DAILY.   mometasone-formoterol 100-5 MCG/ACT Aero Commonly known as:  DULERA Inhale 2 puffs into the lungs 2 (two) times daily.   Nuvigil 200 MG Tabs Generic drug:   Armodafinil Take 200 mg by mouth daily.   pantoprazole 40 MG tablet Commonly known as:  PROTONIX Take 1 tablet (40 mg total) by mouth daily.   predniSONE 10 MG tablet Commonly known as:  DELTASONE Label  & dispense according to the schedule below. 5 Pills PO for 1 day then, 4 Pills PO for 1 day, 3 Pills PO for 1 day, 2 Pills PO for 1 day, 1 Pill PO for 1 days then STOP.   rOPINIRole 1 MG tablet Commonly known as:  REQUIP Take 1 tablet (1 mg total) by mouth at bedtime. What changed:  how much to take         DISCHARGE INSTRUCTIONS:   DIET:  Regular diet  DISCHARGE CONDITION:  Stable  ACTIVITY:  Activity as tolerated  OXYGEN:  Home Oxygen: No.   Oxygen Delivery: room air  DISCHARGE LOCATION:  home   If you experience worsening of your admission symptoms, develop shortness of breath, life threatening emergency, suicidal or homicidal thoughts you must seek medical attention immediately by calling 911 or calling your MD immediately  if symptoms less severe.  You Must read complete instructions/literature along with all the possible adverse reactions/side effects for all the Medicines you take and that have been prescribed to you. Take any new Medicines after you have completely understood and accpet all the possible adverse reactions/side effects.   Please note  You were cared for by a hospitalist during your hospital stay. If you have any questions about your discharge medications or the care you received while you were in the hospital after you are discharged, you can call the unit and asked to speak with the hospitalist on call if the hospitalist that took care of you is not available. Once you are discharged, your primary care physician will handle any further medical issues. Please note that NO REFILLS for any discharge medications will be authorized once you are discharged, as it is imperative that you return to your primary care physician (or establish a relationship  with a primary care physician if you do not have one) for your aftercare needs so that they can reassess your need for medications and monitor your lab values.     Today   Shortness of breath, wheezing improved since admission.  Patient ambulated is no longer hypoxic.  Still has some exertional dyspnea.  Will discharge home today on oral prednisone taper and empiric antibiotics.  VITAL SIGNS:  Blood pressure (!) 129/57, pulse 86, temperature 98.3 F (36.8 C), resp. rate 20, height 5\' 4"  (1.626 m), weight 128.4 kg, SpO2 93 %.  I/O:    Intake/Output Summary (Last 24 hours) at 07/26/2018 1323 Last data filed at 07/26/2018 0900 Gross per 24 hour  Intake 720 ml  Output -  Net 720 ml    PHYSICAL EXAMINATION:   GENERAL:  68 y.o.-year-old obese patient lying in bed in no acute distress.  EYES: Pupils equal, round, reactive to light and accommodation. No scleral icterus. Extraocular muscles intact.  HEENT: Head atraumatic, normocephalic. Oropharynx and  nasopharynx clear.  NECK:  Supple, no jugular venous distention. No thyroid enlargement, no tenderness.  LUNGS: Good air entry bilaterally,minimal end-exp. Wheezing b/l, no rales, rhonchi CARDIOVASCULAR: S1, S2 normal. No murmurs, rubs, or gallops.  ABDOMEN: Soft, nontender, nondistended. Bowel sounds present. No organomegaly or mass.  EXTREMITIES: No cyanosis, clubbing or edema b/l.    NEUROLOGIC: Cranial nerves II through XII are intact. No focal Motor or sensory deficits b/l.   PSYCHIATRIC: The patient is alert and oriented x 3.  SKIN: No obvious rash, lesion, or ulcer.   DATA REVIEW:   CBC Recent Labs  Lab 07/24/18 1133  WBC 12.6*  HGB 10.6*  HCT 33.8*  PLT 251    Chemistries  Recent Labs  Lab 07/24/18 1133  NA 139  K 4.3  CL 101  CO2 27  GLUCOSE 121*  BUN 13  CREATININE 0.87  CALCIUM 9.5  AST 17  ALT 13  ALKPHOS 63  BILITOT 0.5    Cardiac Enzymes Recent Labs  Lab 07/24/18 1133  TROPONINI <0.03     Microbiology Results  No results found for this or any previous visit.  RADIOLOGY:  Dg Chest Port 1 View  Result Date: 07/25/2018 CLINICAL DATA:  Labored breathing tonight.  History of bronchitis. EXAM: PORTABLE CHEST 1 VIEW COMPARISON:  Chest radiograph July 24, 2018 FINDINGS: Cardiomediastinal silhouette is normal. Mildly calcified aortic arch. Bronchitic changes out pleural effusion or focal consolidation. No pneumothorax. Soft tissue planes and included osseous structures are non suspicious. IMPRESSION: 1. Bronchitic changes without focal consolidation. 2.  Aortic Atherosclerosis (ICD10-I70.0). Electronically Signed   By: Elon Alas M.D.   On: 07/25/2018 01:50      Management plans discussed with the patient, family and they are in agreement.  CODE STATUS:     Code Status Orders  (From admission, onward)         Start     Ordered   07/24/18 1522  Full code  Continuous     07/24/18 1521        TOTAL TIME TAKING CARE OF THIS PATIENT: 40 minutes.    Henreitta Leber M.D on 07/26/2018 at 1:23 PM  Between 7am to 6pm - Pager - [phone]  After 6pm go to [email or web address] - Proofreader  [facility]  Office  [phone]  CC: Primary care physician; Glean Hess, MD

## 2018-07-26 NOTE — Progress Notes (Signed)
Samantha Mason was admitted to the Hospital on 07/24/2018 and Discharged  07/26/2018 and should be excused from work/school   for 7 days starting 07/24/2018 , may return to work/school without any restrictions.  Call Abel Presto MD, [facility]  [phone] with questions.  Henreitta Leber M.D on 07/26/2018,at 1:21 PM

## 2018-07-27 ENCOUNTER — Telehealth: Payer: Self-pay

## 2018-07-27 NOTE — Telephone Encounter (Signed)
Transition Care Management Follow-up Telephone Call  Date of discharge and from where: 07/26/18 [facility]  How have you been since you were released from the hospital? Pt states she is feeling better but still doing duoneb nebulizer treatments every 4 hours. She is still feeling weak, especially with any exertion.   Any questions or concerns? No   Items Reviewed:  Did the pt receive and understand the discharge instructions provided? Yes   Medications obtained and verified? Yes   Any new allergies since your discharge? No   Dietary orders reviewed? Yes  Do you have support at home? Yes   Functional Questionnaire: (I = Independent and D = Dependent) ADLs: I  Bathing/Dressing- I  Meal Prep- I  Eating- I  Maintaining continence- I  Transferring/Ambulation- I  Managing Meds- I  Follow up appointments reviewed:   PCP Hospital f/u appt confirmed? Yes  Scheduled to see Dr. Army Melia on 08/03/18 @ 2:40.  Are transportation arrangements needed? No   If their condition worsens, is the pt aware to call PCP or go to the Emergency Dept.? Yes  Was the patient provided with contact information for the PCP's office or ED? Yes  Was to pt encouraged to call back with questions or concerns? Yes

## 2018-07-28 ENCOUNTER — Other Ambulatory Visit: Payer: Self-pay | Admitting: *Deleted

## 2018-07-28 NOTE — Patient Outreach (Signed)
Samantha Mason [facility]) Care Management  07/28/2018  Samantha Mason January 28, 1951 [id number]   Transition of care telephone call  Referral received: 07/28/18 Initial outreach: 07/28/18 Insurance: [place]  Initial unsuccessful telephone call to patient's preferred number in order to complete transition of care assessment; no answer, left HIPAA compliant voicemail message requesting return call.   Objective: Per the electronic medical record, Samantha Mason was hospitalized at [facility] from  3/6-07/26/18 for COPD exacerbation. Comorbidities include: HTN, COPD, OSA on CPAP, GERD, IBS, B12 deficiency, microcytic anemia, morbid obesity, prediabetes, restless leg syndrome and VIt D deficiency. She was discharged to home on 07/26/18 without the need for home health services or durable medical equipment per the discharge summary.   Plan: If no return call from the patient by end of business day today, this RNCM will route unsuccessful outreach letter with [place] Management pamphlet and 24 hour Nurse Advice Line Magnet to [place] Management clinical pool to be mailed to patient's home address. This RNCM will attempt another outreach within 4 business days.

## 2018-07-31 ENCOUNTER — Other Ambulatory Visit: Payer: Self-pay | Admitting: *Deleted

## 2018-07-31 ENCOUNTER — Ambulatory Visit: Payer: Self-pay | Admitting: *Deleted

## 2018-07-31 NOTE — Patient Outreach (Signed)
[facility]) Care Management  07/31/2018  Samantha Mason May 27, 1950 [id number]   Transition of care telephone call  Referral received: 07/28/18 Initial outreach: 07/28/18 Insurance: [place] Choice Plan  Subjective: Successful telephone call to patient's mobile number in order to complete transition of care assessment; 2 HIPAA identifiers verified. Explained purpose of call and completed transition of care assessment.  States she is still easily fatigued and dyspneic with minimal activity. She is using her medications include her home nebulizer and maintenance inhaler as directed. She says she will speak with her provider on 3/16 about changing back to Symbicort in place of the [facility] as she feels the [facility] is less effective.    Objective: Samantha Mason was hospitalized at [facility] from  3/6-07/26/18 for COPD exacerbation. Comorbidities include: HTN, COPD, OSA on CPAP, GERD, IBS, B12 deficiency, microcytic anemia, morbid obesity, prediabetes, restless leg syndrome and Vit D deficiency. She was discharged to home on 07/26/18 without the need for home health services or durable medical equipment per the discharge summary.   Assessment:  Patient voices good understanding of all discharge instructions.  See transition of care flowsheet for assessment details.  Plan:  No ongoing care management needs identified so will close case to [place] Management care management services and route successful outreach letter with [place] Management pamphlet and 24 Hour Nurse Line Magnet to [facility] Management clinical pool to be mailed to patient's home address.   Barrington Ellison RN,CCM,CDE [place] Management Coordinator Office Phone [phone] Office Fax [phone]

## 2018-08-01 ENCOUNTER — Encounter: Payer: Self-pay | Admitting: Internal Medicine

## 2018-08-01 ENCOUNTER — Other Ambulatory Visit: Payer: Self-pay | Admitting: Internal Medicine

## 2018-08-01 DIAGNOSIS — I700 Atherosclerosis of aorta: Secondary | ICD-10-CM | POA: Insufficient documentation

## 2018-08-03 ENCOUNTER — Other Ambulatory Visit: Payer: Self-pay

## 2018-08-03 ENCOUNTER — Ambulatory Visit (INDEPENDENT_AMBULATORY_CARE_PROVIDER_SITE_OTHER): Payer: 59 | Admitting: Internal Medicine

## 2018-08-03 ENCOUNTER — Encounter: Payer: Self-pay | Admitting: Internal Medicine

## 2018-08-03 VITALS — BP 140/82 | HR 86 | Temp 98.1°F | Resp 16 | Ht 64.0 in | Wt 290.0 lb

## 2018-08-03 DIAGNOSIS — I700 Atherosclerosis of aorta: Secondary | ICD-10-CM | POA: Diagnosis not present

## 2018-08-03 DIAGNOSIS — J441 Chronic obstructive pulmonary disease with (acute) exacerbation: Secondary | ICD-10-CM | POA: Diagnosis not present

## 2018-08-03 DIAGNOSIS — J4520 Mild intermittent asthma, uncomplicated: Secondary | ICD-10-CM | POA: Diagnosis not present

## 2018-08-03 MED ORDER — BUDESONIDE-FORMOTEROL FUMARATE 160-4.5 MCG/ACT IN AERO: 2.0000 | INHALATION_SPRAY | Freq: Two times a day (BID) | RESPIRATORY_TRACT | 3 refills | Status: DC

## 2018-08-03 NOTE — Progress Notes (Signed)
Date:  08/03/2018   Name:  Samantha Mason   DOB:  [birth year]   MRN:  [id number]   Chief Complaint: Hospitalization Follow-up (Admit 3/6 Disch 3/8 COPD  not feeling better yet and is still tight Wants to STOP Dulera and go back to Symbicort. ) Admitted to [facility] with COPD/asthma exacerbation.  Treated briefly with BiPap and was able to be weaned off O2.  Sent home with prednisone taper and Doxycycline which she has finished.  She feels much better but does not like Dulera (formulary requirement) and would like to go back to Symbicort. CXR showed no pneumonia.  There was note made of aortic atherosclerosis.  HPI  Review of Systems  Constitutional: Negative for chills, fatigue and fever.  HENT: Negative for trouble swallowing.   Respiratory: Positive for shortness of breath. Negative for cough, chest tightness and wheezing.   Cardiovascular: Negative for chest pain, palpitations and leg swelling.  Neurological: Negative for facial asymmetry and headaches.  Psychiatric/Behavioral: Negative for dysphoric mood and sleep disturbance.    Patient Active Problem List   Diagnosis Date Noted  . Aortic atherosclerosis ([place]) 08/01/2018  . COPD (chronic obstructive pulmonary disease) ([place]) 07/24/2018  . B12 deficiency anemia 07/07/2018  . Benign neoplasm of ascending colon   . Positive colorectal cancer screening using Cologuard test 03/13/2018  . Mild intermittent asthma without complication 47/01/6282  . Vitamin D deficiency 03/17/2017  . Prediabetes 03/14/2017  . Microcytic anemia 03/14/2017  . IBS (irritable bowel syndrome) 03/12/2017  . Allergic rhinitis 03/12/2017  . Restless leg syndrome 03/12/2017  . Acid reflux 10/16/2016  . Essential hypertension 10/16/2016  . OSA on CPAP 12/08/2014  . Obesity, morbid ([place]) 05/31/2014    Allergies  Allergen Reactions  . Penicillins Diarrhea and Nausea And Vomiting    Past Surgical History:  Procedure Laterality Date  . CHOLECYSTECTOMY     . COLONOSCOPY WITH PROPOFOL N/A 03/31/2018   Procedure: COLONOSCOPY WITH PROPOFOL;  Surgeon: Lucilla Lame, MD;  Location: [facility] ENDOSCOPY;  Service: Endoscopy;  Laterality: N/A;  . JOINT REPLACEMENT  2014  . saliva gland removal  1998  . TOTAL KNEE ARTHROPLASTY Left 2014  . TUBAL LIGATION Bilateral     Social History   Tobacco Use  . Smoking status: Never Smoker  . Smokeless tobacco: Never Used  Substance Use Topics  . Alcohol use: No    Alcohol/week: 0.0 standard drinks  . Drug use: No     Medication list has been reviewed and updated.  Current Meds  Medication Sig  . albuterol (ACCUNEB) 1.25 MG/3ML nebulizer solution Take 1 ampule by nebulization every 6 (six) hours as needed for wheezing.  . cetirizine (ZYRTEC) 10 MG tablet Take 1 tablet (10 mg total) by mouth daily.  . cholestyramine (QUESTRAN) 4 GM/DOSE powder MIX 4GM AND DRINK BY MOUTH DAILY.  . diphenoxylate-atropine (LOMOTIL) 2.5-0.025 MG tablet Take 1 tablet by mouth 3 (three) times daily as needed for diarrhea or loose stools.  Marland Kitchen lisinopril (PRINIVIL,ZESTRIL) 10 MG tablet TAKE 1 TABLET (10 MG TOTAL) BY MOUTH DAILY.  . mometasone-formoterol (DULERA) 100-5 MCG/ACT AERO Inhale 2 puffs into the lungs 2 (two) times daily.  Marland Kitchen NUVIGIL 200 MG TABS Take 200 mg by mouth daily.  . pantoprazole (PROTONIX) 40 MG tablet Take 1 tablet (40 mg total) by mouth daily.  Marland Kitchen rOPINIRole (REQUIP) 1 MG tablet Take 1 tablet (1 mg total) by mouth at bedtime. (Patient taking differently: Take 2 mg by mouth at bedtime. )  PHQ 2/9 Scores 07/07/2018 02/13/2018 03/12/2017 10/16/2016  PHQ - 2 Score 0 1 0 6  PHQ- 9 Score - - - 12    Physical Exam Vitals signs and nursing note reviewed.  Constitutional:      General: She is not in acute distress.    Appearance: She is well-developed.  HENT:     Head: Normocephalic and atraumatic.  Neck:     Musculoskeletal: Normal range of motion and neck supple.  Cardiovascular:     Rate and Rhythm: Normal  rate and regular rhythm.     Heart sounds: No murmur.  Pulmonary:     Effort: Pulmonary effort is normal. No respiratory distress.     Breath sounds: Normal breath sounds. No wheezing or rales.  Lymphadenopathy:     Cervical: No cervical adenopathy.  Skin:    General: Skin is warm and dry.     Findings: No rash.  Neurological:     Mental Status: She is alert and oriented to person, place, and time.  Psychiatric:        Behavior: Behavior normal.        Thought Content: Thought content normal.     Wt Readings from Last 3 Encounters:  08/03/18 290 lb (131.5 kg)  07/24/18 283 lb (128.4 kg)  07/07/18 281 lb (127.5 kg)    BP 140/82   Pulse 86   Temp 98.1 F (36.7 C) (Oral)   Resp 16   Ht 5\' 4"  (1.626 m)   Wt 290 lb (131.5 kg)   SpO2 98%   BMI 49.78 kg/m   Assessment and Plan: 1. Chronic obstructive pulmonary disease with acute exacerbation (HCC) Change to symbicort and refer to Pulmonary for further testing - budesonide-formoterol (SYMBICORT) 160-4.5 MCG/ACT inhaler; Inhale 2 puffs into the lungs 2 (two) times daily.  Dispense: 1 Inhaler; Refill: 3 - Ambulatory referral to Pulmonology  2. Mild intermittent asthma without complication Continue as above - pulmonary to help clarify dx  3. Aortic atherosclerosis ([place]) On Questran Should probably be on statin - will discuss further at visit next month   Partially dictated using [place]. Any errors are unintentional.  Halina Maidens, MD [facility]  08/03/2018

## 2018-08-04 ENCOUNTER — Ambulatory Visit: Payer: 59 | Admitting: Nurse Practitioner

## 2018-08-11 ENCOUNTER — Encounter: Payer: Self-pay | Admitting: Internal Medicine

## 2018-08-11 ENCOUNTER — Other Ambulatory Visit: Payer: Self-pay

## 2018-08-11 ENCOUNTER — Ambulatory Visit: Payer: 59 | Admitting: Internal Medicine

## 2018-08-11 VITALS — BP 152/84 | HR 102 | Ht 64.0 in | Wt 292.0 lb

## 2018-08-11 DIAGNOSIS — J4550 Severe persistent asthma, uncomplicated: Secondary | ICD-10-CM

## 2018-08-11 NOTE — Patient Instructions (Signed)
Continue symbicort 2 puffs twice daily, rinse mouth after use.  Continue zyrtec, CPAP, requip.

## 2018-08-11 NOTE — Progress Notes (Signed)
[facility] Consultation      Assessment and Plan:  Asthma with recent exacerbation. - Currently doing better, appears to have recovered from her recent exacerbation. - Continue Symbicort, Zyrtec.  Obstructive sleep apnea. - Patient states she is using CPAP every night, does not have daytime sleepiness. - Her CPAP is 67 years old, we discussed at next visit to possibly send her for a new sleep study try to get her new device.  Restless leg syndrome. - Symptoms currently well controlled with Requip, to continue.  Return in about 6 months (around 02/11/2019).    Date: 08/11/2018  MRN# [id number] Samantha Mason 09-15-1950  Referring Physician:  Dr. Verdell Carmine.   Samantha Mason is a 68 y.o. old female seen in consultation for chief complaint of:    Chief Complaint  Patient presents with  . pulmonary consult    per Dr. Army Melia- pt reports of sob with exertion, non prod cough, wheezing & nasal/head congestion. recent admission 07/24/18    HPI:  The patient is a 68 year old female she has a history of asthma, she was recently admitted to the hospital from 3/6 to 3/8 for COPD exacerbation, treated with IV steroids and duo nebs, she was initially placed on BiPAP. She had previously seen Dr. Stevenson Clinch for asthma in 2016, noted to have triggers of pollen.  Patient also has a history of restless leg syndrome on Requip, OSA on nuvigil. Her medication list includes Symbicort, she was tried on dulera in the past but it made her throat sore.  She is taking symbicort 2 puffs twice daily, rinses mouth after use.   Up until recently she has not had to use her nebs, she had occasional chest tightness. She takes zyrtec daily, no singulair. She has 2 dogs, 1 sleeps in bed with her.  She has reflux controlled with protonix.  She has sinus drainage, controlled with zyrtec.   She uses cpap every night, she is not sleepy during the day, but she takes nuvigil when she is driving long  distances.  She is taking requip which controls her RLS.   **CBC 07/24/18>> AEC 300.  **ABG 07/25/2018>> 7.47/40 1/80 3/29.8. **CT chest 07/02/2018>> images personally reviewed, lung volumes are reduced consistent with restrictive lung disease from body habitus **Chest x-ray 07/24/2018>> imaging personally reviewed, increased lung markings suggestive of chronic bronchitis, hyperinflation suggestive of emphysema.  PMHX:   Past Medical History:  Diagnosis Date  . Acute bronchitis   . Anxiety and depression   . Asthma   . B12 deficiency 03/14/2017  . Chest pain 07/02/2018  . Family history of blood clots 07/16/2017  . Irritable bowel syndrome   . Malignant hypertension   . Obesity   . OSA (obstructive sleep apnea)   . Restless leg syndrome   . Stroke [facility])    Surgical Hx:  Past Surgical History:  Procedure Laterality Date  . CHOLECYSTECTOMY    . COLONOSCOPY WITH PROPOFOL N/A 03/31/2018   Procedure: COLONOSCOPY WITH PROPOFOL;  Surgeon: Lucilla Lame, MD;  Location[facility] ENDOSCOPY;  Service: Endoscopy;  Laterality: N/A;  . JOINT REPLACEMENT  2014  . saliva gland removal  1998  . TOTAL KNEE ARTHROPLASTY Left 2014  . TUBAL LIGATION Bilateral    Family Hx:  Family History  Problem Relation Age of Onset  . Dementia Mother   . COPD Father        1  . Diabetes Father   . Heart failure Brother  2010   Social Hx:   Social History   Tobacco Use  . Smoking status: Never Smoker  . Smokeless tobacco: Never Used  Substance Use Topics  . Alcohol use: No    Alcohol/week: 0.0 standard drinks  . Drug use: No   Medication:    Current Outpatient Medications:  .  acetaminophen (TYLENOL) 500 MG tablet, Take 2 tablets (1,000 mg total) by mouth every 8 (eight) hours as needed for moderate pain., Disp: 30 tablet, Rfl: 0 .  albuterol (ACCUNEB) 1.25 MG/3ML nebulizer solution, Take 1 ampule by nebulization every 6 (six) hours as needed for wheezing., Disp: , Rfl:  .  budesonide-formoterol  (SYMBICORT) 160-4.5 MCG/ACT inhaler, Inhale 2 puffs into the lungs 2 (two) times daily., Disp: 1 Inhaler, Rfl: 3 .  cetirizine (ZYRTEC) 10 MG tablet, Take 1 tablet (10 mg total) by mouth daily., Disp: 30 tablet, Rfl: 2 .  cholestyramine (QUESTRAN) 4 GM/DOSE powder, MIX 4GM AND DRINK BY MOUTH DAILY., Disp: 378 g, Rfl: 5 .  diphenoxylate-atropine (LOMOTIL) 2.5-0.025 MG tablet, Take 1 tablet by mouth 3 (three) times daily as needed for diarrhea or loose stools., Disp: 30 tablet, Rfl: 0 .  lisinopril (PRINIVIL,ZESTRIL) 10 MG tablet, TAKE 1 TABLET (10 MG TOTAL) BY MOUTH DAILY., Disp: 90 tablet, Rfl: 3 .  NUVIGIL 200 MG TABS, Take 200 mg by mouth daily., Disp: 10 tablet, Rfl: 0 .  pantoprazole (PROTONIX) 40 MG tablet, Take 1 tablet (40 mg total) by mouth daily., Disp: 90 tablet, Rfl: 1 .  rOPINIRole (REQUIP) 1 MG tablet, Take 1 tablet (1 mg total) by mouth at bedtime. (Patient taking differently: Take 2 mg by mouth at bedtime. ), Disp: 90 tablet, Rfl: 3   Allergies:  Penicillins  Review of Systems: Gen:  Denies  fever, sweats, chills HEENT: Denies blurred vision, double vision. bleeds, sore throat Cvc:  No dizziness, chest pain. Resp:   Denies cough or sputum production, shortness of breath Gi: Denies swallowing difficulty, stomach pain. Gu:  Denies bladder incontinence, burning urine Ext:   No Joint pain, stiffness. Skin: No skin rash,  hives  Endoc:  No polyuria, polydipsia. Psych: No depression, insomnia. Other:  All other systems were reviewed with the patient and were negative other that what is mentioned in the HPI.   Physical Examination:   VS: BP (!) 152/84 (BP Location: Left Arm, Cuff Size: Normal)   Pulse (!) 102   Ht 5\' 4"  (1.626 m)   Wt 292 lb (132.5 kg)   SpO2 96%   BMI 50.12 kg/m   General Appearance: No distress  Neuro:without focal findings,  speech normal,  HEENT: PERRLA, EOM intact.   Pulmonary: normal breath sounds, No wheezing.  CardiovascularNormal S1,S2.  No  m/r/g.   Abdomen: Benign, Soft, non-tender. Renal:  No costovertebral tenderness  GU:  No performed at this time. Endoc: No evident thyromegaly, no signs of acromegaly. Skin:   warm, no rashes, no ecchymosis  Extremities: normal, no cyanosis, clubbing.  Other findings:    LABORATORY PANEL:   CBC No results for input(s): WBC, HGB, HCT, PLT in the last 168 hours. ------------------------------------------------------------------------------------------------------------------  Chemistries  No results for input(s): NA, K, CL, CO2, GLUCOSE, BUN, CREATININE, CALCIUM, MG, AST, ALT, ALKPHOS, BILITOT in the last 168 hours.  Invalid input(s): GFRCGP ------------------------------------------------------------------------------------------------------------------  Cardiac Enzymes No results for input(s): TROPONINI in the last 168 hours. ------------------------------------------------------------  RADIOLOGY:  No results found.     Thank  you for the consultation and for allowing [facility] to assist in the care of your patient. Our recommendations are noted above.  Please contact us if we can be of further service.   Marda Stalker, M.D., F.C.C.P.  Board Certified in Internal Medicine, Pulmonary Medicine, [place], and Sleep Medicine.  [facility] Office Number: [phone]   08/11/2018

## 2018-08-23 ENCOUNTER — Encounter: Payer: Self-pay | Admitting: Internal Medicine

## 2018-08-26 ENCOUNTER — Ambulatory Visit: Payer: 59 | Admitting: Internal Medicine

## 2018-11-09 ENCOUNTER — Other Ambulatory Visit: Payer: Self-pay | Admitting: Internal Medicine

## 2018-11-09 DIAGNOSIS — K219 Gastro-esophageal reflux disease without esophagitis: Secondary | ICD-10-CM

## 2019-01-08 ENCOUNTER — Other Ambulatory Visit: Payer: Self-pay | Admitting: Internal Medicine

## 2019-01-08 DIAGNOSIS — G2581 Restless legs syndrome: Secondary | ICD-10-CM

## 2019-01-11 ENCOUNTER — Emergency Department
Admission: EM | Admit: 2019-01-11 | Discharge: 2019-01-11 | Disposition: A | Discharge status: Home / Self Care | Payer: 59 | Attending: Emergency Medicine | Admitting: Emergency Medicine

## 2019-01-11 ENCOUNTER — Telehealth: Payer: Self-pay | Admitting: Internal Medicine

## 2019-01-11 ENCOUNTER — Emergency Department: Payer: 59

## 2019-01-11 ENCOUNTER — Encounter: Payer: Self-pay | Admitting: Emergency Medicine

## 2019-01-11 ENCOUNTER — Other Ambulatory Visit: Payer: Self-pay

## 2019-01-11 DIAGNOSIS — Z79899 Other long term (current) drug therapy: Secondary | ICD-10-CM | POA: Diagnosis not present

## 2019-01-11 DIAGNOSIS — I10 Essential (primary) hypertension: Secondary | ICD-10-CM | POA: Insufficient documentation

## 2019-01-11 DIAGNOSIS — R51 Headache: Secondary | ICD-10-CM | POA: Diagnosis not present

## 2019-01-11 DIAGNOSIS — R0602 Shortness of breath: Secondary | ICD-10-CM | POA: Insufficient documentation

## 2019-01-11 DIAGNOSIS — Z8673 Personal history of transient ischemic attack (TIA), and cerebral infarction without residual deficits: Secondary | ICD-10-CM | POA: Diagnosis not present

## 2019-01-11 LAB — COMPREHENSIVE METABOLIC PANEL
ALT: 13 U/L (ref 0–44)
AST: 16 U/L (ref 15–41)
Albumin: 3.5 g/dL (ref 3.5–5.0)
Alkaline Phosphatase: 71 U/L (ref 38–126)
Anion gap: 9 (ref 5–15)
BUN: 16 mg/dL (ref 8–23)
CO2: 25 mmol/L (ref 22–32)
Calcium: 9.4 mg/dL (ref 8.9–10.3)
Chloride: 105 mmol/L (ref 98–111)
Creatinine, Ser: 1.06 mg/dL — ABNORMAL HIGH (ref 0.44–1.00)
GFR calc Af Amer: >60 mL/min (ref >60)
GFR calc non Af Amer: 55 mL/min — ABNORMAL LOW (ref >60)
Glucose, Bld: 119 mg/dL — ABNORMAL HIGH (ref 70–99)
Potassium: 3.9 mmol/L (ref 3.5–5.1)
Sodium: 139 mmol/L (ref 135–145)
Total Bilirubin: 0.4 mg/dL (ref 0.3–1.2)
Total Protein: 7.2 g/dL (ref 6.5–8.1)

## 2019-01-11 LAB — CBC
HCT: 37 % (ref 36.0–46.0)
Hemoglobin: 11.6 g/dL — ABNORMAL LOW (ref 12.0–15.0)
MCH: 25.6 pg — ABNORMAL LOW (ref 26.0–34.0)
MCHC: 31.4 g/dL (ref 30.0–36.0)
MCV: 81.7 fL (ref 80.0–100.0)
Platelets: 289 10*3/uL (ref 150–400)
RBC: 4.53 MIL/uL (ref 3.87–5.11)
RDW: 14.4 % (ref 11.5–15.5)
WBC: 9.2 10*3/uL (ref 4.0–10.5)
nRBC: 0 % (ref 0.0–0.2)

## 2019-01-11 LAB — BRAIN NATRIURETIC PEPTIDE: B Natriuretic Peptide: 27 pg/mL (ref 0.0–100.0)

## 2019-01-11 LAB — DIFFERENTIAL
Abs Immature Granulocytes: 0.05 10*3/uL (ref 0.00–0.07)
Basophils Absolute: 0.1 10*3/uL (ref 0.0–0.1)
Basophils Relative: 1 %
Eosinophils Absolute: 0.3 10*3/uL (ref 0.0–0.5)
Eosinophils Relative: 3 %
Immature Granulocytes: 1 %
Lymphocytes Relative: 21 %
Lymphs Abs: 1.9 10*3/uL (ref 0.7–4.0)
Monocytes Absolute: 0.6 10*3/uL (ref 0.1–1.0)
Monocytes Relative: 7 %
Neutro Abs: 6.3 10*3/uL (ref 1.7–7.7)
Neutrophils Relative %: 67 %

## 2019-01-11 LAB — PROTIME-INR
INR: 1 (ref 0.8–1.2)
Prothrombin Time: 12.8 seconds (ref 11.4–15.2)

## 2019-01-11 LAB — TROPONIN I (HIGH SENSITIVITY)
Troponin I (High Sensitivity): 5 ng/L (ref <18)
Troponin I (High Sensitivity): 6 ng/L (ref <18)

## 2019-01-11 LAB — APTT: aPTT: 30 seconds (ref 24–36)

## 2019-01-11 MED ORDER — KETOROLAC TROMETHAMINE 30 MG/ML IJ SOLN
15.0000 mg | Freq: Once | INTRAMUSCULAR | Status: AC
  Administered 2019-01-11: 15 mg via INTRAVENOUS
  Filled 2019-01-11: qty 1

## 2019-01-11 MED ORDER — PROCHLORPERAZINE EDISYLATE 10 MG/2ML IJ SOLN
10.0000 mg | Freq: Once | INTRAMUSCULAR | Status: AC
  Administered 2019-01-11: 10 mg via INTRAVENOUS
  Filled 2019-01-11: qty 2

## 2019-01-11 MED ORDER — FUROSEMIDE 10 MG/ML IJ SOLN
20.0000 mg | Freq: Once | INTRAMUSCULAR | Status: AC
  Administered 2019-01-11: 20 mg via INTRAVENOUS
  Filled 2019-01-11: qty 4

## 2019-01-11 MED ORDER — DIPHENHYDRAMINE HCL 50 MG/ML IJ SOLN
25.0000 mg | Freq: Once | INTRAMUSCULAR | Status: AC
  Administered 2019-01-11: 25 mg via INTRAVENOUS
  Filled 2019-01-11: qty 1

## 2019-01-11 MED ORDER — LISINOPRIL 10 MG PO TABS: 15.0000 mg | ORAL_TABLET | Freq: Every day | ORAL | 0 refills | Status: DC

## 2019-01-11 NOTE — ED Notes (Signed)
Patient transported to X-ray 

## 2019-01-11 NOTE — ED Provider Notes (Signed)
[facility] Emergency Department Provider Note  ____________________________________________   First MD Initiated Contact with Patient 01/11/19 1537     (approximate)  I have reviewed the triage vital signs and the nursing notes.   HISTORY  Chief Complaint Headache and Hypertension    HPI Samantha Mason is a 68 y.o. female with past medical history as below including OSA, stroke, hypertension, reported congestive heart failure, here with headache and shortness of breath.  The patient states that starting at the end of last week, she began to experience a mild, aching, throbbing, frontal headache.  Since then, she has had intermittently mild to moderate headache.  The headache feels like it is behind her bilateral eyes.  No vision changes.  She checked her blood pressure at work, and it was in the 244 systolic which is very high for her.  She subsequent presents for evaluation.  She he reports a remote history of stroke, but denies any new neurological deficits.  No vision changes, focal numbness or weakness.  No facial droop.  No speech difficulty.  She also endorses mild worsening of shortness of breath with exertion.  She denies any shortness of breath or chest pain at rest.  She reportedly has had to be on Lasix in the past for congestive heart failure but does not regularly see a cardiologist.  No other acute complaints.  No fevers or chills.       Past Medical History:  Diagnosis Date   Acute bronchitis    Anxiety and depression    Asthma    B12 deficiency 03/14/2017   Chest pain 07/02/2018   COPD (chronic obstructive pulmonary disease) ([place]) 07/24/2018   Family history of blood clots 07/16/2017   Irritable bowel syndrome    Malignant hypertension    Obesity    OSA (obstructive sleep apnea)    Restless leg syndrome    Stroke [facility])     Patient Active Problem List   Diagnosis Date Noted   Aortic atherosclerosis ([place]) 08/01/2018    B12 deficiency anemia 07/07/2018   Benign neoplasm of ascending colon    Positive colorectal cancer screening using Cologuard test 03/13/2018   Severe persistent asthma 02/13/2018   Vitamin D deficiency 03/17/2017   Prediabetes 03/14/2017   Microcytic anemia 03/14/2017   IBS (irritable bowel syndrome) 03/12/2017   Allergic rhinitis 03/12/2017   Restless leg syndrome 03/12/2017   Acid reflux 10/16/2016   Essential hypertension 10/16/2016   OSA on CPAP 12/08/2014   Obesity, morbid ([place]) 05/31/2014    Past Surgical History:  Procedure Laterality Date   CHOLECYSTECTOMY     COLONOSCOPY WITH PROPOFOL N/A 03/31/2018   Procedure: COLONOSCOPY WITH PROPOFOL;  Surgeon: Lucilla Lame, MD;  Location: [facility] ENDOSCOPY;  Service: Endoscopy;  Laterality: N/A;   JOINT REPLACEMENT  2014   saliva gland removal  1998   TOTAL KNEE ARTHROPLASTY Left 2014   TUBAL LIGATION Bilateral     Prior to Admission medications   Medication Sig Start Date End Date Taking? Authorizing Provider  acetaminophen (TYLENOL) 500 MG tablet Take 2 tablets (1,000 mg total) by mouth every 8 (eight) hours as needed for moderate pain. 04/09/17   Plonk, [name], MD  albuterol (ACCUNEB) 1.25 MG/3ML nebulizer solution Take 1 ampule by nebulization every 6 (six) hours as needed for wheezing.    [provider]  budesonide-formoterol (SYMBICORT) 160-4.5 MCG/ACT inhaler Inhale 2 puffs into the lungs 2 (two) times daily. 08/03/18   Glean Hess, MD  cetirizine (ZYRTEC) 10 MG tablet Take 1 tablet (10 mg total) by mouth daily. 12/07/14   Ashok Norris, MD  cholestyramine Lucrezia Starch) 4 GM/DOSE powder MIX 4GM AND DRINK BY MOUTH DAILY. 03/20/18   Glean Hess, MD  diphenoxylate-atropine (LOMOTIL) 2.5-0.025 MG tablet Take 1 tablet by mouth 3 (three) times daily as needed for diarrhea or loose stools. 10/16/16   Roselee Nova, MD  lisinopril (ZESTRIL) 10 MG tablet Take 1.5 tablets (15 mg total) by mouth  daily for 10 days. 01/11/19 01/21/19  Duffy Bruce, MD  NUVIGIL 200 MG TABS Take 200 mg by mouth daily. 07/07/18   Glean Hess, MD  pantoprazole (PROTONIX) 40 MG tablet TAKE 1 TABLET BY MOUTH DAILY 11/09/18   Glean Hess, MD  rOPINIRole (REQUIP) 1 MG tablet TAKE 1 TABLET (1 MG TOTAL) BY MOUTH AT BEDTIME. 01/08/19   Glean Hess, MD    Allergies Penicillins  Family History  Problem Relation Age of Onset   Dementia Mother    COPD Father        38   Diabetes Father    Heart failure Brother        2010    Social History Social History   Tobacco Use   Smoking status: Never Smoker   Smokeless tobacco: Never Used  Substance Use Topics   Alcohol use: No    Alcohol/week: 0.0 standard drinks   Drug use: No    Review of Systems  Review of Systems  Constitutional: Negative for fatigue and fever.  HENT: Negative for congestion and sore throat.   Eyes: Negative for visual disturbance.  Respiratory: Positive for shortness of breath. Negative for cough.   Cardiovascular: Negative for chest pain.  Gastrointestinal: Negative for abdominal pain, diarrhea, nausea and vomiting.  Genitourinary: Negative for flank pain.  Musculoskeletal: Negative for back pain and neck pain.  Skin: Negative for rash and wound.  Neurological: Positive for headaches. Negative for weakness.  All other systems reviewed and are negative.    ____________________________________________  PHYSICAL EXAM:      VITAL SIGNS: ED Triage Vitals [01/11/19 1448]  Enc Vitals Group     BP (!) 203/80     Pulse Rate (!) 102     Resp 18     Temp 98.4 F (36.9 C)     Temp Source Oral     SpO2 96 %     Weight (!) 310 lb (140.6 kg)     Height 5\' 4"  (1.626 m)     Head Circumference      Peak Flow      Pain Score 9     Pain Loc      Pain Edu?      Excl. in [place]?      Physical Exam Vitals signs and nursing note reviewed.  Constitutional:      General: She is not in acute distress.     Appearance: She is well-developed.  HENT:     Head: Normocephalic and atraumatic.  Eyes:     Conjunctiva/sclera: Conjunctivae normal.  Neck:     Musculoskeletal: Neck supple.  Cardiovascular:     Rate and Rhythm: Normal rate and regular rhythm.     Heart sounds: Normal heart sounds. No murmur. No friction rub.  Pulmonary:     Effort: Pulmonary effort is normal. No respiratory distress.     Breath sounds: Normal breath sounds. No wheezing or rales.  Abdominal:     General: There is  no distension.     Palpations: Abdomen is soft.     Tenderness: There is no abdominal tenderness.  Musculoskeletal:     Comments: 2+ pitting edema bilateral lower extremities  Skin:    General: Skin is warm.     Capillary Refill: Capillary refill takes less than 2 seconds.  Neurological:     Mental Status: She is alert and oriented to person, place, and time.     Motor: No abnormal muscle tone.      Neurological Exam:  Mental Status: Alert and oriented to person, place, and time. Attention and concentration normal. Speech clear. Recent memory is intact. Cranial Nerves: Visual fields grossly intact. EOMI and PERRLA. No nystagmus noted. Facial sensation intact at forehead, maxillary cheek, and chin/mandible bilaterally. No facial asymmetry or weakness. Hearing grossly normal. Uvula is midline, and palate elevates symmetrically. Normal SCM and trapezius strength. Tongue midline without fasciculations. Motor: Muscle strength 5/5 in proximal and distal UE and LE bilaterally. No pronator drift. Muscle tone normal. Reflexes: 2+ and symmetrical in all four extremities.  Sensation: Intact to light touch in upper and lower extremities distally bilaterally.  Gait: Normal without ataxia. Coordination: Normal FTN bilaterally.    ____________________________________________   LABS (all labs ordered are listed, but only abnormal results are displayed)  Labs Reviewed  CBC - Abnormal; Notable for the following  components:      Result Value   Hemoglobin 11.6 (*)    MCH 25.6 (*)    All other components within normal limits  COMPREHENSIVE METABOLIC PANEL - Abnormal; Notable for the following components:   Glucose, Bld 119 (*)    Creatinine, Ser 1.06 (*)    GFR calc non Af Amer 55 (*)    All other components within normal limits  PROTIME-INR  APTT  DIFFERENTIAL  BRAIN NATRIURETIC PEPTIDE  TROPONIN I (HIGH SENSITIVITY)  TROPONIN I (HIGH SENSITIVITY)    ____________________________________________  EKG: Normal sinus rhythm, ventricular rate 99.  No acute ST or T-segment changes.  Normal axis.  QTc 436. ________________________________________  RADIOLOGY All imaging, including plain films, CT scans, and ultrasounds, independently reviewed by me, and interpretations confirmed via formal radiology reads.  ED MD interpretation:   CT head: No acute abnormality Chest x-ray: No acute abnormality  Official radiology report(s): Dg Chest 2 View  Result Date: 01/11/2019 CLINICAL DATA:  Shortness of breath. Dyspnea on exertion. EXAM: CHEST - 2 VIEW COMPARISON:  Chest x-rays dated 07/25/2018 and 07/01/2018 FINDINGS: The heart size and pulmonary vascularity are. There is slight chronic peribronchial thickening. No infiltrates or effusions. No bone abnormality. IMPRESSION: No acute cardiopulmonary disease. Slight chronic bronchitic changes. Electronically Signed   By: Lorriane Shire M.D.   On: 01/11/2019 16:45   Ct Head Wo Contrast  Result Date: 01/11/2019 CLINICAL DATA:  Acute severe headache, hypertension EXAM: CT HEAD WITHOUT CONTRAST TECHNIQUE: Contiguous axial images were obtained from the base of the skull through the vertex without intravenous contrast. COMPARISON:  07/01/2018 FINDINGS: Brain: Similar minor chronic white microvascular ischemic changes throughout both cerebral hemispheres. No acute intracranial, mass lesion, new infarction, shift, herniation, hydrocephalus, or extra-axial collection.  No focal mass effect or edema. Cisterns patent. No cerebellar abnormality. Vascular: No hyperdense vessel or unexpected calcification. Skull: Normal. Negative for fracture or focal lesion. Sinuses/Orbits: No acute finding. Other: None. IMPRESSION: Stable mild white matter microvascular ischemic changes. No interval change or acute process by noncontrast CT. Electronically Signed   By: Jerilynn Mages.  Shick M.D.   On: 01/11/2019 15:14  ____________________________________________  PROCEDURES   Procedure(s) performed (including Critical Care):  Procedures  ____________________________________________  INITIAL IMPRESSION / MDM / [place] / ED COURSE  As part of my medical decision making, I reviewed the following data within the electronic MEDICAL RECORD NUMBER Notes from prior ED visits and [place] Controlled Substance Database      *DELESHA POHLMAN was evaluated in Emergency Department on 01/11/2019 for the symptoms described in the history of present illness. She was evaluated in the context of the global COVID-19 pandemic, which necessitated consideration that the patient might be at risk for infection with the SARS-CoV-2 virus that causes COVID-19. Institutional protocols and algorithms that pertain to the evaluation of patients at risk for COVID-19 are in a state of rapid change based on information released by regulatory bodies including the CDC and federal and state organizations. These policies and algorithms were followed during the patient's care in the ED.  Some ED evaluations and interventions may be delayed as a result of limited staffing during the pandemic.*      Medical Decision Making: 68 yo F here with mild generalized HA, SOB w/ exertion. Suspect HTN-related headache, possible underlying CHF based no history. CT scan neg, no focal deficits to suggset CVA. She Aox4 with no signs of HTN encephalopathy. No CP, EKG nonischemic with neg trop - doubt ACS. CXR clear. BNP, remainder of labs  very reassuring. Renal function @ baseline. Will treat for HTN, HA, and refer to PCP. May ultimately need outpt TTE but do not feel this is emergently indicated. BP and HA both improved. No fever, chills, or signs of meningitis or encephalitis. HA began gradually and is not c/f SAH.  ____________________________________________  FINAL CLINICAL IMPRESSION(S) / ED DIAGNOSES  Final diagnoses:  Essential hypertension     MEDICATIONS GIVEN DURING THIS VISIT:  Medications  furosemide (LASIX) injection 20 mg (20 mg Intravenous Given 01/11/19 1610)  prochlorperazine (COMPAZINE) injection 10 mg (10 mg Intravenous Given 01/11/19 1729)  diphenhydrAMINE (BENADRYL) injection 25 mg (25 mg Intravenous Given 01/11/19 1730)  ketorolac (TORADOL) 30 MG/ML injection 15 mg (15 mg Intravenous Given 01/11/19 1729)     ED Discharge Orders         Ordered    lisinopril (ZESTRIL) 10 MG tablet  Daily     01/11/19 1823           Note:  This document was prepared using Dragon voice recognition software and may include unintentional dictation errors.   Duffy Bruce, MD 01/11/19 1850

## 2019-01-11 NOTE — ED Triage Notes (Signed)
Pt presents to ED via POV with c/o HA, HTN, and blurred vision. Pt states hx of stroke. 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 Pt ambulatory without difficulty, still c/o HA and mild blurry vision.

## 2019-01-11 NOTE — ED Notes (Signed)
Called lab and spoke with [facility] and they will add on Trop and BNP

## 2019-01-11 NOTE — Discharge Instructions (Signed)
For now, to treat your high blood pressure, I would recommend taking 1.5 tablets of the lisinopril for a total of 15 mg, instead of 10 mg, once daily.  Monitor your blood pressure twice a day at home.  Call your doctor in the morning to discuss your ER visit.  Given your shortness of breath, I think it be very reasonable to set you up with possible outpatient cardiac work-up.  For now, take Tylenol as needed for headache and the lisinopril for your high blood pressure.  You should also discuss your leg swelling with your doctor and consider possible Lasix as needed.

## 2019-01-11 NOTE — Telephone Encounter (Signed)
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

## 2019-01-11 NOTE — Telephone Encounter (Signed)
Patient was told go to the ER for her BP- it was running 235/124

## 2019-01-13 ENCOUNTER — Other Ambulatory Visit: Payer: Self-pay

## 2019-01-13 ENCOUNTER — Encounter: Payer: Self-pay | Admitting: Internal Medicine

## 2019-01-13 ENCOUNTER — Ambulatory Visit: Payer: 59 | Admitting: Internal Medicine

## 2019-01-13 DIAGNOSIS — I10 Essential (primary) hypertension: Secondary | ICD-10-CM | POA: Diagnosis not present

## 2019-01-13 MED ORDER — LISINOPRIL 20 MG PO TABS: 20.0000 mg | ORAL_TABLET | Freq: Every day | ORAL | 1 refills | Status: DC

## 2019-01-13 MED ORDER — HYDROCHLOROTHIAZIDE 25 MG PO TABS: 25.0000 mg | ORAL_TABLET | Freq: Every day | ORAL | 1 refills | Status: DC

## 2019-01-13 NOTE — Progress Notes (Signed)
Date:  01/13/2019   Name:  Samantha Mason   DOB:  09-25-1950   MRN:  [id number]   Chief Complaint: Hypertension (Er follow up for High BP. Still having a headache but not as bad as the day of the ER visit.)  Hypertension This is a chronic problem. The problem has been gradually worsening since onset. Associated symptoms include headaches. Pertinent negatives include no chest pain, palpitations, peripheral edema or shortness of breath. There are no associated agents to hypertension. Treatments tried: seen in ED - lisinopril changed to 15 mg. Improvement on treatment: pt unable to check BP at home. There are no compliance problems.   Headache  This is a new problem. Pertinent negatives include no abdominal pain, dizziness or fever. Treatments tried: seen in ED - CT showed only microvascular changes with no acute abnormality.  HA felt to be related HTN. Her past medical history is significant for hypertension.  Headache was treated in ED with compazine, benadryl and toradol.  She is feeling much better, still has mild headache.   Review of Systems  Constitutional: Positive for unexpected weight change (has gained a fair amount of weight). Negative for chills, fatigue and fever.  Respiratory: Negative for choking, shortness of breath and wheezing.   Cardiovascular: Positive for leg swelling (has mild chronic edema). Negative for chest pain and palpitations.  Gastrointestinal: Negative for abdominal pain and constipation.  Neurological: Positive for headaches. Negative for dizziness.  Hematological: Negative for adenopathy.  Psychiatric/Behavioral: Negative for confusion, decreased concentration and sleep disturbance.    Patient Active Problem List   Diagnosis Date Noted  . Aortic atherosclerosis ([place]) 08/01/2018  . B12 deficiency anemia 07/07/2018  . Benign neoplasm of ascending colon   . Positive colorectal cancer screening using Cologuard test 03/13/2018  . Severe persistent asthma  02/13/2018  . Vitamin D deficiency 03/17/2017  . Prediabetes 03/14/2017  . Microcytic anemia 03/14/2017  . IBS (irritable bowel syndrome) 03/12/2017  . Allergic rhinitis 03/12/2017  . Restless leg syndrome 03/12/2017  . Acid reflux 10/16/2016  . Essential hypertension 10/16/2016  . OSA on CPAP 12/08/2014  . Obesity, morbid ([place]) 05/31/2014    Allergies  Allergen Reactions  . Penicillins Diarrhea and Nausea And Vomiting    Past Surgical History:  Procedure Laterality Date  . CHOLECYSTECTOMY    . COLONOSCOPY WITH PROPOFOL N/A 03/31/2018   Procedure: COLONOSCOPY WITH PROPOFOL;  Surgeon: Lucilla Lame, MD;  Location: [facility] ENDOSCOPY;  Service: Endoscopy;  Laterality: N/A;  . JOINT REPLACEMENT  2014  . saliva gland removal  1998  . TOTAL KNEE ARTHROPLASTY Left 2014  . TUBAL LIGATION Bilateral     Social History   Tobacco Use  . Smoking status: Never Smoker  . Smokeless tobacco: Never Used  Substance Use Topics  . Alcohol use: No    Alcohol/week: 0.0 standard drinks  . Drug use: No     Medication list has been reviewed and updated.  Current Meds  Medication Sig  . acetaminophen (TYLENOL) 500 MG tablet Take 2 tablets (1,000 mg total) by mouth every 8 (eight) hours as needed for moderate pain.  Marland Kitchen albuterol (ACCUNEB) 1.25 MG/3ML nebulizer solution Take 1 ampule by nebulization every 6 (six) hours as needed for wheezing.  . budesonide-formoterol (SYMBICORT) 160-4.5 MCG/ACT inhaler Inhale 2 puffs into the lungs 2 (two) times daily.  . cetirizine (ZYRTEC) 10 MG tablet Take 1 tablet (10 mg total) by mouth daily.  . cholestyramine (QUESTRAN) 4 GM/DOSE powder MIX 4GM AND  DRINK BY MOUTH DAILY.  . diphenoxylate-atropine (LOMOTIL) 2.5-0.025 MG tablet Take 1 tablet by mouth 3 (three) times daily as needed for diarrhea or loose stools.  Marland Kitchen lisinopril (ZESTRIL) 20 MG tablet Take 1 tablet (20 mg total) by mouth daily for 20 days.  Marland Kitchen NUVIGIL 200 MG TABS Take 200 mg by mouth daily.  .  pantoprazole (PROTONIX) 40 MG tablet TAKE 1 TABLET BY MOUTH DAILY  . rOPINIRole (REQUIP) 1 MG tablet TAKE 1 TABLET (1 MG TOTAL) BY MOUTH AT BEDTIME. (Patient taking differently: Take 2 mg by mouth at bedtime. )  . [DISCONTINUED] lisinopril (ZESTRIL) 10 MG tablet Take 1.5 tablets (15 mg total) by mouth daily for 10 days.    PHQ 2/9 Scores 01/13/2019 07/07/2018 02/13/2018 03/12/2017  PHQ - 2 Score 0 0 1 0  PHQ- 9 Score - - - -    BP Readings from Last 3 Encounters:  01/13/19 (!) 164/80  01/11/19 (!) 161/64  08/11/18 (!) 152/84    Physical Exam Vitals signs and nursing note reviewed.  Constitutional:      General: She is not in acute distress.    Appearance: She is well-developed.  HENT:     Head: Normocephalic and atraumatic.  Cardiovascular:     Rate and Rhythm: Normal rate and regular rhythm.     Heart sounds: No murmur.  Pulmonary:     Effort: Pulmonary effort is normal. No respiratory distress.  Musculoskeletal: Normal range of motion.     Right lower leg: Edema present.     Left lower leg: Edema present.  Skin:    General: Skin is warm and dry.     Findings: No rash.  Neurological:     Mental Status: She is alert and oriented to person, place, and time.  Psychiatric:        Behavior: Behavior normal.        Thought Content: Thought content normal.     Wt Readings from Last 3 Encounters:  01/13/19 (!) 306 lb (138.8 kg)  01/11/19 (!) 310 lb (140.6 kg)  08/11/18 292 lb (132.5 kg)    BP (!) 164/80   Pulse 90   Ht 5\' 4"  (1.626 m)   Wt (!) 306 lb (138.8 kg)   SpO2 93%   BMI 52.52 kg/m   Assessment and Plan: 1. Essential hypertension Uncontrolled with essentially normal ED workup Pt will require more medication to get BP to goal Will avoid CCB due to edema Plan to increase ACEI to 20 mg per day and add HCTZ 25 mg Note to be out of work through the end of the week Follow up in 4 weeks at next appt May take tylenol as needed for headache - lisinopril (ZESTRIL)  20 MG tablet; Take 1 tablet (20 mg total) by mouth daily for 20 days.  Dispense: 90 tablet; Refill: 1 - hydrochlorothiazide (HYDRODIURIL) 25 MG tablet; Take 1 tablet (25 mg total) by mouth daily.  Dispense: 90 tablet; Refill: 1   Partially dictated using Editor, commissioning. Any errors are unintentional.  Halina Maidens, MD [facility]  01/13/2019

## 2019-01-15 NOTE — Telephone Encounter (Signed)
Please advise? This is regarding patients FMLA forms.

## 2019-01-15 NOTE — Telephone Encounter (Signed)
Replied in a separate my chart message because it would not let me reply to this message. See my chart message named FMLA after this.

## 2019-01-18 NOTE — Telephone Encounter (Signed)
Dr. Army Melia- you may need to respond to the patient so that she understands?  Please advise.

## 2019-01-20 ENCOUNTER — Ambulatory Visit: Payer: 59 | Admitting: Internal Medicine

## 2019-01-27 ENCOUNTER — Other Ambulatory Visit: Payer: Self-pay

## 2019-01-27 DIAGNOSIS — G2581 Restless legs syndrome: Secondary | ICD-10-CM

## 2019-01-27 MED ORDER — ROPINIROLE HCL 1 MG PO TABS: 1.0000 mg | ORAL_TABLET | Freq: Two times a day (BID) | ORAL | 2 refills | Status: DC

## 2019-02-04 ENCOUNTER — Telehealth: Payer: Self-pay

## 2019-02-04 NOTE — Telephone Encounter (Signed)
Received [employer] FMLA papers and filled out the forms based on HTN as examples did list as FMLA reasoning. I simply said that she was out 3 days Dx: Elevated BP episode/ EDEMA R/L leg and that yes she has chronic condition but it does require f/u appts for essential Htn related top this as well as her Edema R and L leg.  I also stated she will not need  she can go back on 01/18/2019 with no restrictions. Patient must have this to keep from being given points anytime she is taken out of work and [employer] does cover HTN and Edema. Forms are on PCP desk for sign and fax if she approves.

## 2019-02-09 DIAGNOSIS — Z96652 Presence of left artificial knee joint: Secondary | ICD-10-CM | POA: Diagnosis not present

## 2019-02-09 DIAGNOSIS — G8929 Other chronic pain: Secondary | ICD-10-CM | POA: Diagnosis not present

## 2019-02-09 DIAGNOSIS — M25561 Pain in right knee: Secondary | ICD-10-CM | POA: Diagnosis not present

## 2019-02-09 DIAGNOSIS — M1711 Unilateral primary osteoarthritis, right knee: Secondary | ICD-10-CM | POA: Diagnosis not present

## 2019-02-09 DIAGNOSIS — M1712 Unilateral primary osteoarthritis, left knee: Secondary | ICD-10-CM | POA: Diagnosis not present

## 2019-02-13 DIAGNOSIS — Z96652 Presence of left artificial knee joint: Secondary | ICD-10-CM | POA: Insufficient documentation

## 2019-02-13 DIAGNOSIS — M1711 Unilateral primary osteoarthritis, right knee: Secondary | ICD-10-CM | POA: Insufficient documentation

## 2019-02-15 ENCOUNTER — Encounter: Payer: Self-pay | Admitting: Internal Medicine

## 2019-02-15 ENCOUNTER — Other Ambulatory Visit: Payer: Self-pay

## 2019-02-15 ENCOUNTER — Other Ambulatory Visit
Admission: RE | Admit: 2019-02-15 | Discharge: 2019-02-15 | Disposition: A | Discharge status: Home / Self Care | Payer: 59 | Attending: Internal Medicine | Admitting: Internal Medicine

## 2019-02-15 ENCOUNTER — Ambulatory Visit (INDEPENDENT_AMBULATORY_CARE_PROVIDER_SITE_OTHER): Payer: 59 | Admitting: Internal Medicine

## 2019-02-15 VITALS — BP 124/66 | HR 85 | Ht 64.0 in | Wt 303.0 lb

## 2019-02-15 DIAGNOSIS — Z0000 Encounter for general adult medical examination without abnormal findings: Secondary | ICD-10-CM

## 2019-02-15 DIAGNOSIS — Z1231 Encounter for screening mammogram for malignant neoplasm of breast: Secondary | ICD-10-CM | POA: Diagnosis not present

## 2019-02-15 DIAGNOSIS — J4550 Severe persistent asthma, uncomplicated: Secondary | ICD-10-CM

## 2019-02-15 DIAGNOSIS — R7303 Prediabetes: Secondary | ICD-10-CM

## 2019-02-15 DIAGNOSIS — E559 Vitamin D deficiency, unspecified: Secondary | ICD-10-CM | POA: Insufficient documentation

## 2019-02-15 DIAGNOSIS — I10 Essential (primary) hypertension: Secondary | ICD-10-CM | POA: Insufficient documentation

## 2019-02-15 DIAGNOSIS — D513 Other dietary vitamin B12 deficiency anemia: Secondary | ICD-10-CM | POA: Diagnosis not present

## 2019-02-15 DIAGNOSIS — Z23 Encounter for immunization: Secondary | ICD-10-CM | POA: Diagnosis not present

## 2019-02-15 LAB — TSH: TSH: 1.856 u[IU]/mL (ref 0.350–4.500)

## 2019-02-15 LAB — POCT URINALYSIS DIPSTICK
Bilirubin, UA: NEGATIVE
Glucose, UA: NEGATIVE
Ketones, UA: NEGATIVE
Leukocytes, UA: NEGATIVE
Nitrite, UA: NEGATIVE
Protein, UA: NEGATIVE
Spec Grav, UA: 1.02 (ref 1.010–1.025)
Urobilinogen, UA: 0.2 E.U./dL
pH, UA: 5 (ref 5.0–8.0)

## 2019-02-15 LAB — HEMOGLOBIN A1C
Hgb A1c MFr Bld: 6 % — ABNORMAL HIGH (ref 4.8–5.6)
Mean Plasma Glucose: 125.5 mg/dL

## 2019-02-15 LAB — LIPID PANEL
Cholesterol: 148 mg/dL (ref 0–200)
HDL: 57 mg/dL (ref >40)
LDL Cholesterol: 76 mg/dL (ref 0–99)
Total CHOL/HDL Ratio: 2.6 RATIO
Triglycerides: 77 mg/dL (ref <150)
VLDL: 15 mg/dL (ref 0–40)

## 2019-02-15 LAB — VITAMIN B12: Vitamin B-12: 223 pg/mL (ref 180–914)

## 2019-02-15 MED ORDER — MODAFINIL 200 MG PO TABS: 200.0000 mg | ORAL_TABLET | Freq: Every day | ORAL | 0 refills | Status: DC | PRN

## 2019-02-15 MED ORDER — LISINOPRIL 10 MG PO TABS: 10.0000 mg | ORAL_TABLET | Freq: Every day | ORAL | 1 refills | Status: DC

## 2019-02-15 NOTE — Progress Notes (Signed)
Date:  02/15/2019   Name:  Samantha Mason   DOB:  1950-10-17   MRN:  [id number]   Chief Complaint: Annual Exam (Breast Exam. High dose flu shot. ) Samantha Mason is a 68 y.o. female who presents today for her Complete Annual Exam. She feels fairly well. She reports exercising some. She reports she is sleeping fairly well. She denies breast issues.  She is due for mammogram. She is having a rash from Nuvigil that she took this past weekend.  Mammogram 01/2018 Colonoscopy 01/2018 Pap discontinued  Hypertension This is a chronic problem. The problem is controlled (138/60). Pertinent negatives include no chest pain, headaches, palpitations or shortness of breath. Past treatments include ACE inhibitors and diuretics (cut lisinopril back to 10 mg due to dizziness). The current treatment provides significant improvement.  Gastroesophageal Reflux She complains of heartburn. She reports no abdominal pain, no chest pain, no coughing or no wheezing. Pertinent negatives include no fatigue.  Asthma There is no cough, shortness of breath or wheezing. This is a recurrent problem. The problem occurs intermittently. Associated symptoms include heartburn. Pertinent negatives include no chest pain, fever, headaches or trouble swallowing. Her symptoms are alleviated by beta-agonist and steroid inhaler (Symbicort). Her past medical history is significant for asthma.  Knee Pain  There was no injury mechanism. The pain is present in the left knee and right knee. The quality of the pain is described as aching and shooting. The pain is at a severity of 6/10. The pain is moderate. The pain has been constant since onset. The symptoms are aggravated by weight bearing and movement. She has tried acetaminophen and NSAIDs for the symptoms. The treatment provided mild relief.   Lab Results  Component Value Date   WBC 9.2 01/11/2019   HGB 11.6 (L) 01/11/2019   HCT 37.0 01/11/2019   MCV 81.7 01/11/2019   PLT 289  01/11/2019   Lab Results  Component Value Date   CREATININE 1.06 (H) 01/11/2019   BUN 16 01/11/2019   NA 139 01/11/2019   K 3.9 01/11/2019   CL 105 01/11/2019   CO2 25 01/11/2019   Lab Results  Component Value Date   CHOL 152 02/13/2018   HDL 56 02/13/2018   LDLCALC 86 02/13/2018   TRIG 50 02/13/2018   CHOLHDL 2.7 02/13/2018   Lab Results  Component Value Date   TSH 2.178 02/13/2018     Review of Systems  Constitutional: Negative for chills, fatigue and fever.  HENT: Negative for congestion, hearing loss, tinnitus, trouble swallowing and voice change.   Eyes: Negative for visual disturbance.  Respiratory: Negative for cough, chest tightness, shortness of breath and wheezing.   Cardiovascular: Negative for chest pain, palpitations and leg swelling.  Gastrointestinal: Positive for heartburn. Negative for abdominal pain, constipation, diarrhea and vomiting.  Endocrine: Negative for polydipsia and polyuria.  Genitourinary: Negative for dysuria, frequency, genital sores, vaginal bleeding and vaginal discharge.  Musculoskeletal: Positive for arthralgias (right knee OA - needs TKA). Negative for gait problem and joint swelling.  Skin: Positive for rash. Negative for color change.  Neurological: Negative for dizziness, tremors, light-headedness and headaches.  Hematological: Negative for adenopathy. Does not bruise/bleed easily.  Psychiatric/Behavioral: Negative for dysphoric mood and sleep disturbance. The patient is not nervous/anxious.     Patient Active Problem List   Diagnosis Date Noted  . Aortic atherosclerosis ([place]) 08/01/2018  . B12 deficiency anemia 07/07/2018  . Benign neoplasm of ascending colon   . Positive colorectal  cancer screening using Cologuard test 03/13/2018  . Severe persistent asthma 02/13/2018  . Vitamin D deficiency 03/17/2017  . Prediabetes 03/14/2017  . Microcytic anemia 03/14/2017  . IBS (irritable bowel syndrome) 03/12/2017  . Allergic rhinitis  03/12/2017  . Restless leg syndrome 03/12/2017  . Acid reflux 10/16/2016  . Essential hypertension 10/16/2016  . OSA on CPAP 12/08/2014  . Obesity, morbid ([place]) 05/31/2014    Allergies  Allergen Reactions  . Nuvigil [Armodafinil] Hives  . Penicillins Diarrhea and Nausea And Vomiting    Past Surgical History:  Procedure Laterality Date  . CHOLECYSTECTOMY    . COLONOSCOPY WITH PROPOFOL N/A 03/31/2018   Procedure: COLONOSCOPY WITH PROPOFOL;  Surgeon: Lucilla Lame, MD;  Location: [facility] ENDOSCOPY;  Service: Endoscopy;  Laterality: N/A;  . JOINT REPLACEMENT  2014  . saliva gland removal  1998  . TOTAL KNEE ARTHROPLASTY Left 2014  . TUBAL LIGATION Bilateral     Social History   Tobacco Use  . Smoking status: Never Smoker  . Smokeless tobacco: Never Used  Substance Use Topics  . Alcohol use: No    Alcohol/week: 0.0 standard drinks  . Drug use: No     Medication list has been reviewed and updated.  Current Meds  Medication Sig  . acetaminophen (TYLENOL) 500 MG tablet Take 2 tablets (1,000 mg total) by mouth every 8 (eight) hours as needed for moderate pain.  Marland Kitchen albuterol (ACCUNEB) 1.25 MG/3ML nebulizer solution Take 1 ampule by nebulization every 6 (six) hours as needed for wheezing.  . budesonide-formoterol (SYMBICORT) 160-4.5 MCG/ACT inhaler Inhale 2 puffs into the lungs 2 (two) times daily.  . cetirizine (ZYRTEC) 10 MG tablet Take 1 tablet (10 mg total) by mouth daily.  . cholestyramine (QUESTRAN) 4 GM/DOSE powder MIX 4GM AND DRINK BY MOUTH DAILY.  . diphenoxylate-atropine (LOMOTIL) 2.5-0.025 MG tablet Take 1 tablet by mouth 3 (three) times daily as needed for diarrhea or loose stools.  . hydrochlorothiazide (HYDRODIURIL) 25 MG tablet Take 1 tablet (25 mg total) by mouth daily.  Marland Kitchen lisinopril (ZESTRIL) 20 MG tablet Take 1 tablet (20 mg total) by mouth daily for 20 days. (Patient taking differently: Take 10 mg by mouth daily. )  . modafinil (PROVIGIL) 200 MG tablet Take 1  tablet (200 mg total) by mouth daily as needed.  . pantoprazole (PROTONIX) 40 MG tablet TAKE 1 TABLET BY MOUTH DAILY  . rOPINIRole (REQUIP) 1 MG tablet Take 1 tablet (1 mg total) by mouth 2 (two) times daily.  . [DISCONTINUED] modafinil (PROVIGIL) 200 MG tablet Take 200 mg by mouth daily.    PHQ 2/9 Scores 02/15/2019 01/13/2019 07/07/2018 02/13/2018  PHQ - 2 Score 0 0 0 1  PHQ- 9 Score - - - -    BP Readings from Last 3 Encounters:  02/15/19 124/66  01/13/19 (!) 164/80  01/11/19 (!) 161/64    Physical Exam Vitals signs and nursing note reviewed.  Constitutional:      General: She is not in acute distress.    Appearance: She is well-developed.  HENT:     Head: Normocephalic and atraumatic.     Right Ear: Tympanic membrane and ear canal normal.     Left Ear: Tympanic membrane and ear canal normal.     Nose:     Right Sinus: No maxillary sinus tenderness.     Left Sinus: No maxillary sinus tenderness.  Eyes:     General: No scleral icterus.       Right eye: No discharge.  Left eye: No discharge.     Conjunctiva/sclera: Conjunctivae normal.  Neck:     Musculoskeletal: Normal range of motion. No erythema.     Thyroid: No thyromegaly.     Vascular: No carotid bruit.  Cardiovascular:     Rate and Rhythm: Normal rate and regular rhythm.     Pulses: Normal pulses.     Heart sounds: Normal heart sounds.  Pulmonary:     Effort: Pulmonary effort is normal. No respiratory distress.     Breath sounds: No wheezing.  Chest:     Breasts:        Right: No mass, nipple discharge, skin change or tenderness.        Left: No mass, nipple discharge, skin change or tenderness.  Abdominal:     General: Bowel sounds are normal.     Palpations: Abdomen is soft.     Tenderness: There is no abdominal tenderness.  Musculoskeletal:     Right knee: She exhibits decreased range of motion. She exhibits no effusion.     Right lower leg: No edema.     Left lower leg: No edema.   Lymphadenopathy:     Cervical: No cervical adenopathy.  Skin:    General: Skin is warm and dry.     Findings: No rash.  Neurological:     Mental Status: She is alert and oriented to person, place, and time.     Cranial Nerves: No cranial nerve deficit.     Sensory: No sensory deficit.     Deep Tendon Reflexes: Reflexes are normal and symmetric.  Psychiatric:        Speech: Speech normal.        Behavior: Behavior normal.        Thought Content: Thought content normal.     Wt Readings from Last 3 Encounters:  02/15/19 (!) 303 lb (137.4 kg)  01/13/19 (!) 306 lb (138.8 kg)  01/11/19 (!) 310 lb (140.6 kg)    BP 124/66   Pulse 85   Ht 5\' 4"  (1.626 m)   Wt (!) 303 lb (137.4 kg)   SpO2 95%   BMI 52.01 kg/m   Assessment and Plan: 1. Annual physical exam Normal exam except for weight Continue to work on diet changes for weight loss - Lipid panel - POCT urinalysis dipstick  2. Encounter for screening mammogram for breast cancer To be scheduled - MM 3D SCREEN BREAST BILATERAL; Future  3. Essential hypertension Clinically stable exam with well controlled BP.   Tolerating medications, lisinopril and hctz, without side effects at this time. Pt to continue current regimen and low sodium diet; benefits of regular exercise as able discussed. - TSH  4. Prediabetes Controlled; continue diet and weight loss - Hemoglobin A1c  5. Severe persistent asthma without complication Symptoms are well controlled on current therapy No escalation in symptoms, night time inhaler use, or recent ED or antibiotic/oral steroid use.  6. Need for immunization against influenza - Flu Vaccine QUAD High Dose(Fluad)  7. Vitamin D deficiency Encourage pt to take regularly to maintain adequate levels for eventual TKAs - VITAMIN D 25 Hydroxy (Vit-D Deficiency, Fractures)  8. Other dietary vitamin B12 deficiency anemia Check labs, continue supplementation - Vitamin B12   Partially dictated using  Editor, commissioning. Any errors are unintentional.  Halina Maidens, MD [facility]  02/15/2019

## 2019-02-16 ENCOUNTER — Other Ambulatory Visit: Payer: Self-pay | Admitting: Internal Medicine

## 2019-02-16 ENCOUNTER — Encounter: Payer: Self-pay | Admitting: Internal Medicine

## 2019-02-16 DIAGNOSIS — E559 Vitamin D deficiency, unspecified: Secondary | ICD-10-CM

## 2019-02-16 LAB — VITAMIN D 25 HYDROXY (VIT D DEFICIENCY, FRACTURES): Vit D, 25-Hydroxy: 16.3 ng/mL — ABNORMAL LOW (ref 30.0–100.0)

## 2019-02-16 MED ORDER — VITAMIN D (ERGOCALCIFEROL) 1.25 MG (50000 UNIT) PO CAPS: 50000.0000 [IU] | ORAL_CAPSULE | ORAL | 3 refills | Status: DC

## 2019-02-16 NOTE — Progress Notes (Signed)
Patient agrees to take vit supplement. Wants sent to pharmacy.

## 2019-02-23 ENCOUNTER — Other Ambulatory Visit: Payer: Self-pay | Admitting: Internal Medicine

## 2019-03-03 ENCOUNTER — Ambulatory Visit: Payer: 59 | Admitting: Internal Medicine

## 2019-03-03 ENCOUNTER — Ambulatory Visit: Payer: 59 | Admitting: Pulmonary Disease

## 2019-03-03 ENCOUNTER — Encounter: Payer: Self-pay | Admitting: Internal Medicine

## 2019-03-03 ENCOUNTER — Other Ambulatory Visit: Payer: Self-pay

## 2019-03-03 VITALS — BP 138/78 | HR 118 | Temp 97.9°F | Ht 64.0 in | Wt 297.8 lb

## 2019-03-03 DIAGNOSIS — J4520 Mild intermittent asthma, uncomplicated: Secondary | ICD-10-CM

## 2019-03-03 DIAGNOSIS — G4733 Obstructive sleep apnea (adult) (pediatric): Secondary | ICD-10-CM

## 2019-03-03 NOTE — Progress Notes (Signed)
[facility] Consultation      Date: 03/03/2019  MRN# [id number] Samantha Mason 08/25/1950  Referring Physician:  Dr. Verdell Carmine.   **CBC 07/24/18>> AEC 300.  **ABG 07/25/2018>> 7.47/40 1/80 3/29.8. **CT chest 07/02/2018>> images personally reviewed, lung volumes are reduced consistent with restrictive lung disease from body habitus **Chest x-ray 07/24/2018>>  increased lung markings suggestive of chronic bronchitis, hyperinflation suggestive of emphysema.   SYNOPSIS The patient is a 68 year old female she has a history of asthma admitted to the hospital from 3/6 to 3/8 for COPD exacerbation, treated with IV steroids and duo nebs, she was initially placed on BiPAP. She had previously seen Dr. Stevenson Clinch for asthma in 2016, noted to have triggers of pollen.  -history of restless leg syndrome on Requip, OSA on nuvigil. -Her medication list includes Symbicort, she was tried on dulera in the past but it made her throat sore.  -rinses mouth after use.  - She has 2 dogs, 1 sleeps in bed with her.  She has reflux controlled with protonix.  She has sinus drainage, controlled with zyrtec.   She uses cpap every night -she is not sleepy during the day, but she takes nuvigil when she is driving long distances.  She is taking requip which controls her RLS.   CC  Follow up ASTHMA Follow up OSA   HPI:   Follow-up for asthma At this time her asthma seems to be well-controlled at this time No signs of exacerbation at this time No indication for steroids at this time or antibiotics    Follow-up OSA Patient uses CPAP every night She takes Requip for restless leg syndrome   No asthma exacerbation at this time No evidence of heart failure at this time No evidence or signs of infection at this time No respiratory distress No fevers, chills, nausea, vomiting, diarrhea No evidence of lower extremity edema No evidence hemoptysis  Surgical preop assessment Patient has low to  moderate risk for postop complications Patient is to undergo knee replacement surgery Patient is optimized with her medical therapy and CPAP     PMHX:   Past Medical History:  Diagnosis Date  . Acute bronchitis   . Anxiety and depression   . Asthma   . B12 deficiency 03/14/2017  . Chest pain 07/02/2018  . COPD (chronic obstructive pulmonary disease) ([place]) 07/24/2018  . Family history of blood clots 07/16/2017  . Irritable bowel syndrome   . Malignant hypertension   . Obesity   . OSA (obstructive sleep apnea)   . Restless leg syndrome   . Stroke [facility])    Surgical Hx:  Past Surgical History:  Procedure Laterality Date  . CHOLECYSTECTOMY    . COLONOSCOPY WITH PROPOFOL N/A 03/31/2018   Procedure: COLONOSCOPY WITH PROPOFOL;  Surgeon: Lucilla Lame, MD;  Location: [facility] ENDOSCOPY;  Service: Endoscopy;  Laterality: N/A;  . JOINT REPLACEMENT  2014  . saliva gland removal  1998  . TOTAL KNEE ARTHROPLASTY Left 2014  . TUBAL LIGATION Bilateral    Family Hx:  Family History  Problem Relation Age of Onset  . Dementia Mother   . COPD Father        84  . Diabetes Father   . Heart failure Brother        2010   Social Hx:   Social History   Tobacco Use  . Smoking status: Never Smoker  . Smokeless tobacco: Never Used  Substance Use Topics  . Alcohol use: No    Alcohol/week:  0.0 standard drinks  . Drug use: No   Medication:    Current Outpatient Medications:  .  acetaminophen (TYLENOL) 500 MG tablet, Take 2 tablets (1,000 mg total) by mouth every 8 (eight) hours as needed for moderate pain., Disp: 30 tablet, Rfl: 0 .  albuterol (ACCUNEB) 1.25 MG/3ML nebulizer solution, Take 1 ampule by nebulization every 6 (six) hours as needed for wheezing., Disp: , Rfl:  .  budesonide-formoterol (SYMBICORT) 160-4.5 MCG/ACT inhaler, Inhale 2 puffs into the lungs 2 (two) times daily., Disp: 1 Inhaler, Rfl: 3 .  cetirizine (ZYRTEC) 10 MG tablet, Take 1 tablet (10 mg total) by mouth daily., Disp:  30 tablet, Rfl: 2 .  cholestyramine (QUESTRAN) 4 GM/DOSE powder, MIX 4GM AND DRINK BY MOUTH DAILY. *SANDOZ ONLY*, Disp: 378 g, Rfl: 5 .  diphenoxylate-atropine (LOMOTIL) 2.5-0.025 MG tablet, Take 1 tablet by mouth 3 (three) times daily as needed for diarrhea or loose stools., Disp: 30 tablet, Rfl: 0 .  hydrochlorothiazide (HYDRODIURIL) 25 MG tablet, Take 1 tablet (25 mg total) by mouth daily., Disp: 90 tablet, Rfl: 1 .  lisinopril (ZESTRIL) 10 MG tablet, Take 1 tablet (10 mg total) by mouth daily., Disp: 90 tablet, Rfl: 1 .  modafinil (PROVIGIL) 200 MG tablet, Take 1 tablet (200 mg total) by mouth daily as needed., Disp: 10 tablet, Rfl: 0 .  pantoprazole (PROTONIX) 40 MG tablet, TAKE 1 TABLET BY MOUTH DAILY, Disp: 90 tablet, Rfl: 1 .  rOPINIRole (REQUIP) 1 MG tablet, Take 1 tablet (1 mg total) by mouth 2 (two) times daily., Disp: 180 tablet, Rfl: 2 .  Vitamin D, Ergocalciferol, (DRISDOL) 1.25 MG (50000 UT) CAPS capsule, Take 1 capsule (50,000 Units total) by mouth every 7 (seven) days., Disp: 12 capsule, Rfl: 3   Allergies:  Nuvigil [armodafinil] and Penicillins   Review of Systems:  Gen:  Denies  fever, sweats, chills weight loss  HEENT: Denies blurred vision, double vision, ear pain, eye pain, hearing loss, nose bleeds, sore throat Cardiac:  No dizziness, chest pain or heaviness, chest tightness,edema, No JVD Resp:   No cough, -sputum production, -shortness of breath,-wheezing, -hemoptysis,  Gi: Denies swallowing difficulty, stomach pain, nausea or vomiting, diarrhea, constipation, bowel incontinence Gu:  Denies bladder incontinence, burning urine Ext:   Denies Joint pain, stiffness or swelling Skin: Denies  skin rash, easy bruising or bleeding or hives Endoc:  Denies polyuria, polydipsia , polyphagia or weight change Psych:   Denies depression, insomnia or hallucinations  Other:  All other systems negative  BP 138/78   Pulse (!) 118   Temp 97.9 F (36.6 C) (Temporal)   Ht 5\' 4"   (1.626 m)   Wt 297 lb 12.8 oz (135.1 kg)   SpO2 97%   BMI 51.12 kg/m    Physical Examination:   GENERAL:NAD, no fevers, chills, no weakness no fatigue HEAD: Normocephalic, atraumatic.  EYES: PERLA, EOMI No scleral icterus.  NECK: Supple. No thyromegaly.  No JVD.  PULMONARY: CTA B/L no wheezing, rhonchi, crackles CARDIOVASCULAR: S1 and S2. Regular rate and rhythm. No murmurs GASTROINTESTINAL: Soft, nontender, nondistended. Positive bowel sounds.  MUSCULOSKELETAL: No swelling, clubbing, or edema.  NEUROLOGIC: No gross focal neurological deficits. 5/5 strength all extremities SKIN: No ulceration, lesions, rashes, or cyanosis.  PSYCHIATRIC: Insight, judgment intact. -depression -anxiety ALL OTHER ROS ARE NEGATIVE      Assessment and Plan:  Mild intermittent asthma Seems to be well-controlled at this time with Symbicort She continues to take Zyrtec  OSA Patient uses and benefits from  CPAP therapy Patient with excellent compliance   Restless leg syndrome Seems to be well-controlled with Requip  Obesity -recommend significant weight loss -recommend changing diet  Deconditioned state -Recommend increased daily activity and exercise    Surgical preop assessment Patient has low to moderate risk for postop complications Patient is to undergo knee replacement surgery Patient is optimized with her medical therapy and CPAP  Preoperative Pulmonary Risk Assessment Patient is proposed for low to moderate risk surgery.   Definite risk factors for post-operative pulmonary complications include age >49, COPD, CHF, ASA class >2, functional dependence, OSA, pulmonary HTN, baseline hypoxia, poor nutritional status, surgery >3 hours, emergency surgery and high-risk surgical sites (AAA, upper abdomen, thoracic, head/neck). - ABG is unlikely to aid in pre-op evaluation  General Risk Reduction Strategies: - All patients warrant post-operative incentive spirometry. For those with  obstruction, also consider flutter valve. - Early ambulation, PT/OT - DVT prophylaxis where appropriate - Adequate pain control without oversedation       COVID-19 EDUCATION: The signs and symptoms of COVID-19 were discussed with the patient and how to seek care for testing.  The importance of social distancing was discussed today. Hand Washing Techniques and avoid touching face was advised.     MEDICATION ADJUSTMENTS/LABS AND TESTS ORDERED: Continue CPAP as prescribed  Continue Symbicort  ALBUTEROL As needed  AVOID allergens  Try Caffeine pills OTC on long drives   CURRENT MEDICATIONS REVIEWED AT LENGTH WITH PATIENT TODAY   Patient satisfied with Plan of action and management. All questions answered  Follow up in 6 months    Courtnay Petrilla Patricia Pesa, M.D.  [facility]  Medical Director [place] Director [facility] Cardio-Pulmonary Department

## 2019-03-03 NOTE — Patient Instructions (Signed)
Continue CPAP as prescribed  Continue Symbicort  ALBUTEROL As needed  AVOID allergens  Try Caffeine pills OTC on long drives

## 2019-03-15 IMAGING — MG DIGITAL SCREENING BILATERAL MAMMOGRAM WITH TOMO AND CAD
6 of 10 series · 6 of 30 positions shown · non-contrast
Comparison: None.

ACR Breast Density Category a: The breast tissue is almost entirely
fatty.

CLINICAL DATA: Screening.

EXAM:
DIGITAL SCREENING BILATERAL MAMMOGRAM WITH TOMO AND CAD

[L MLO synth-2D]
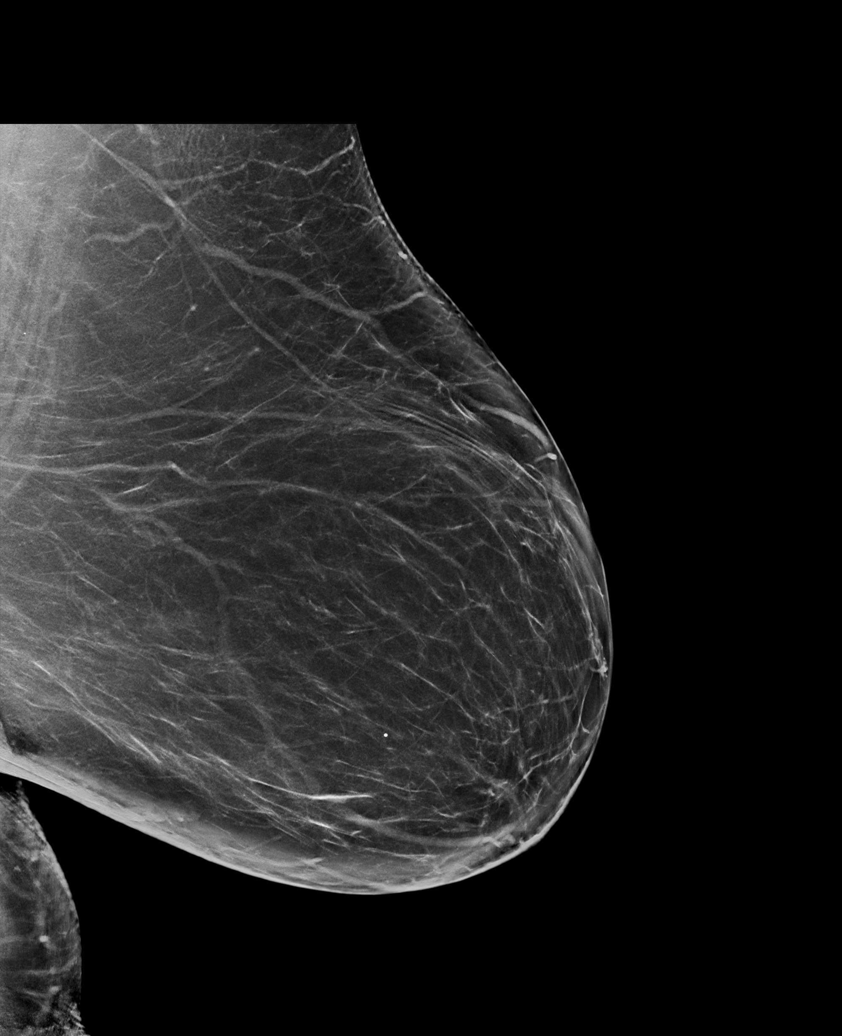

[L CC synth-2D]
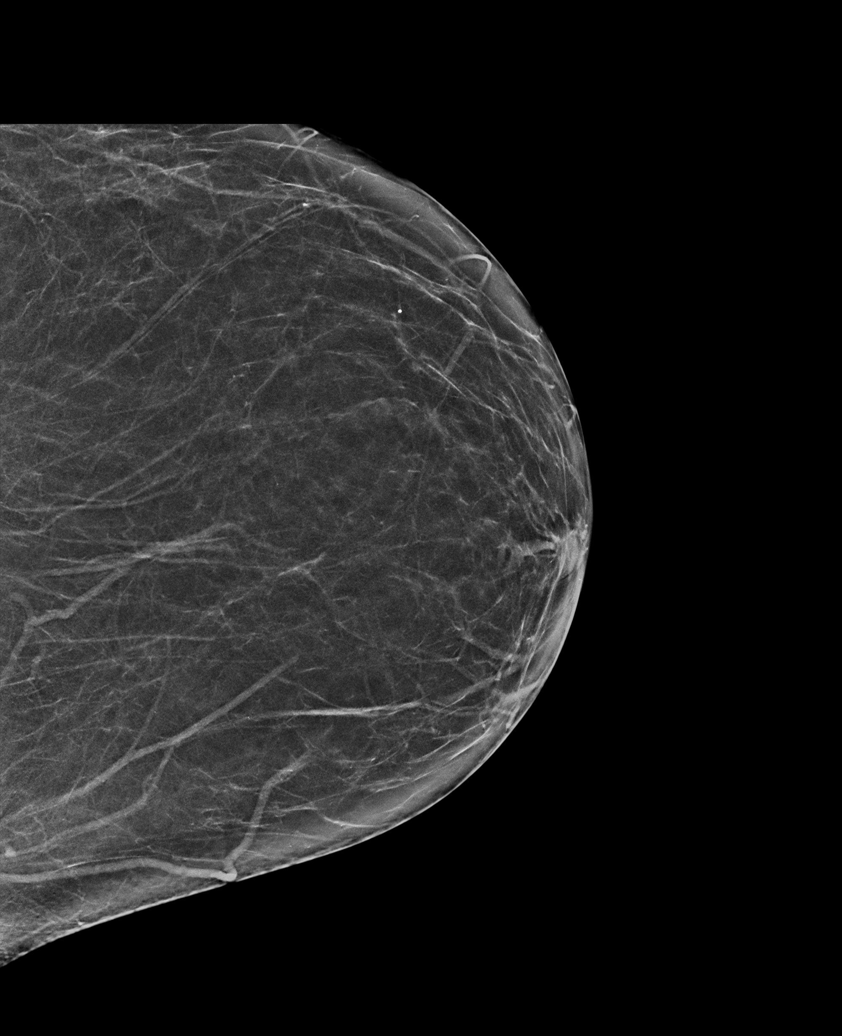

[R CC synth-2D]
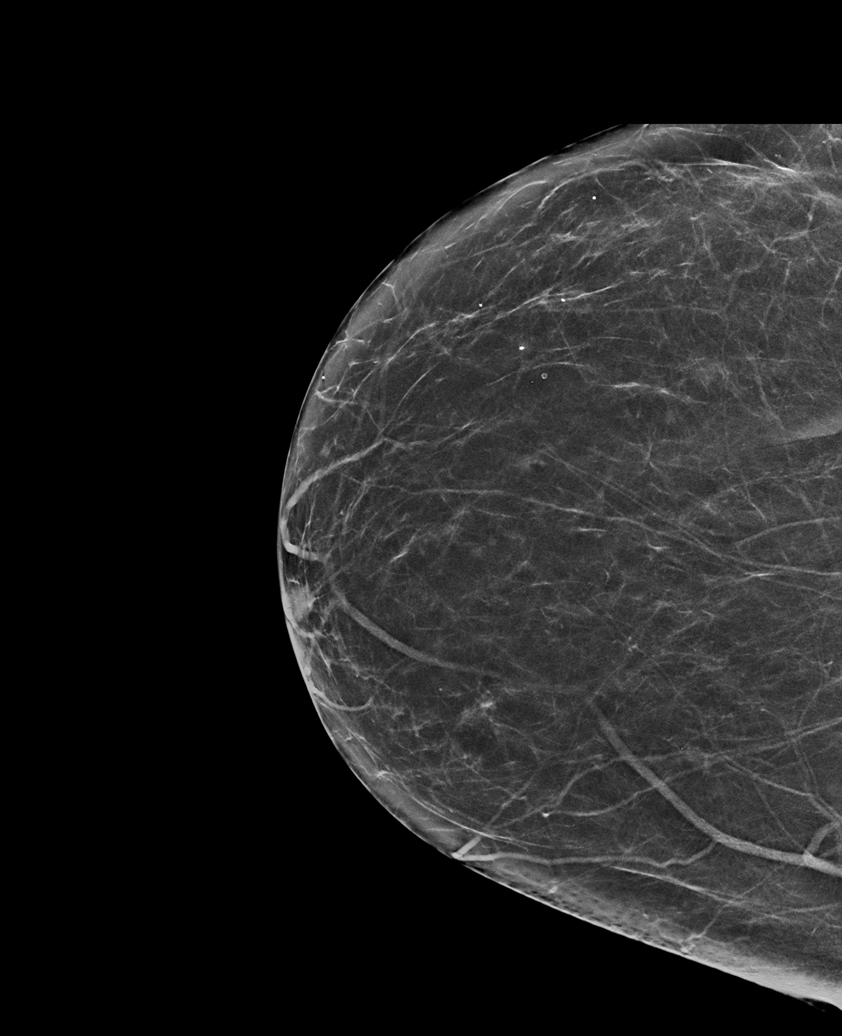

[L XCCL synth-2D]
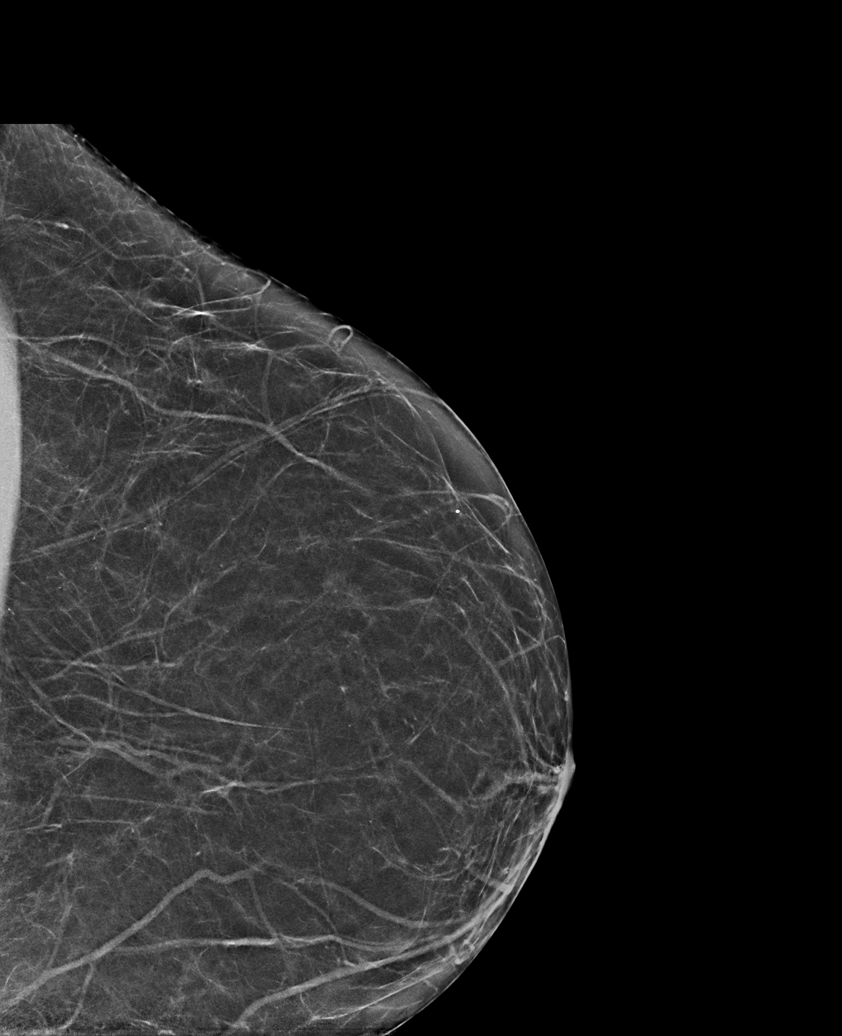

[R MLO synth-2D]
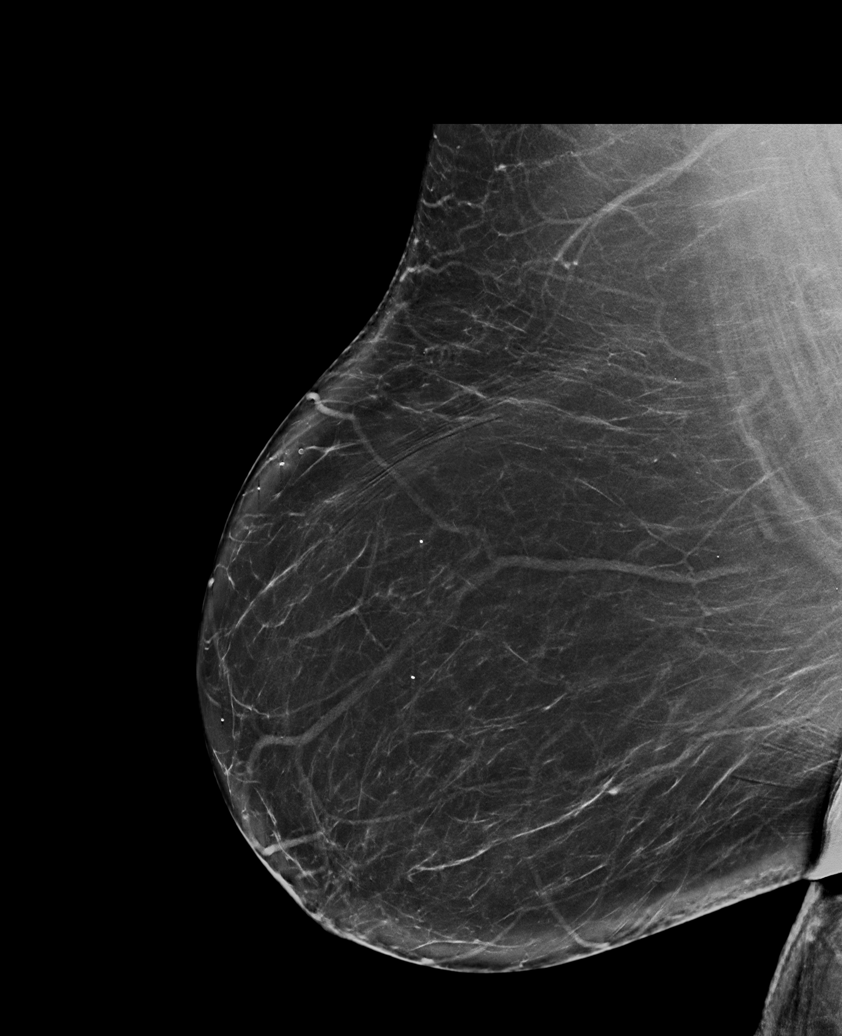

[L XCCL tomo · tomo slice 31/61.0]
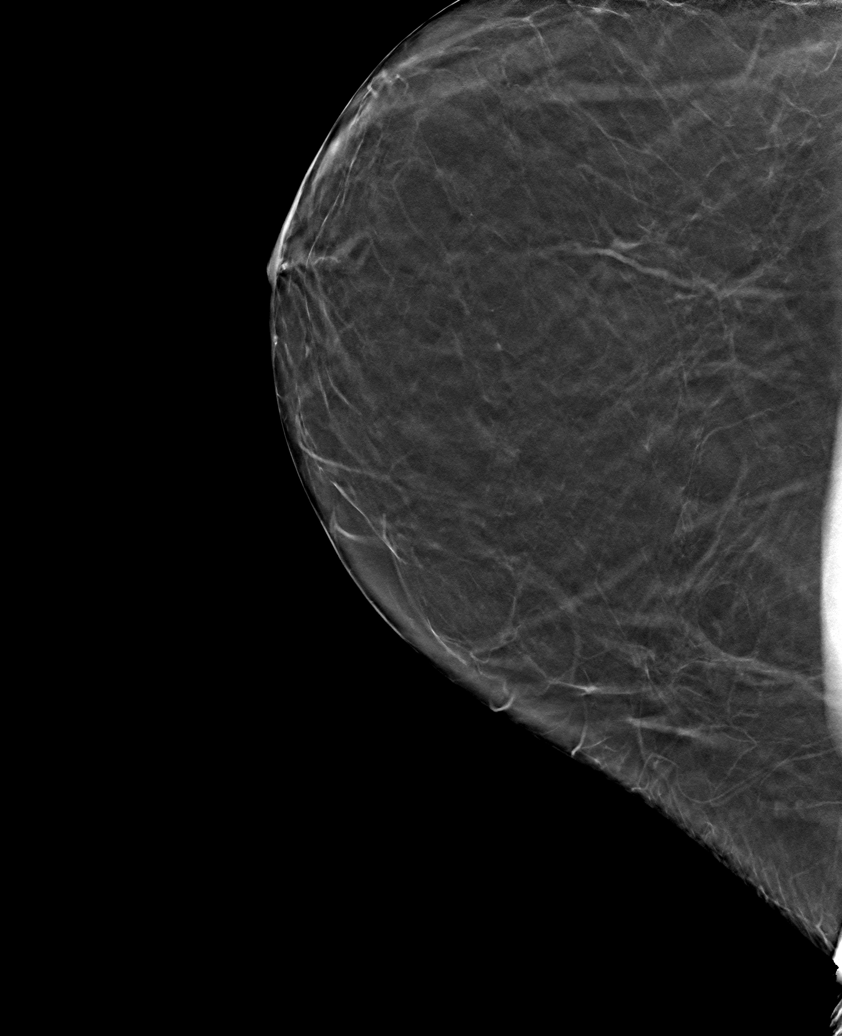

[6 of 30 positions shown; findings below may reference images not displayed]

FINDINGS: There are no findings suspicious for malignancy. Images were
processed with CAD.
IMPRESSION: No mammographic evidence of malignancy. A result letter of this
screening mammogram will be mailed directly to the patient.

RECOMMENDATION:
Screening mammogram in one year. (Code:[id number])

BI-RADS CATEGORY  1: Negative.

## 2019-03-19 ENCOUNTER — Other Ambulatory Visit: Payer: Self-pay | Admitting: Internal Medicine

## 2019-03-19 DIAGNOSIS — J441 Chronic obstructive pulmonary disease with (acute) exacerbation: Secondary | ICD-10-CM

## 2019-04-04 NOTE — Discharge Instructions (Signed)
Instructions after Total Knee Replacement   Samantha Mason, Jr., M.D.     Dept. of Orthopaedics & Sports Medicine  [facility]  [address]  [place], [place]  [address]  Phone: [phone]   Fax: [phone]    DIET: Drink plenty of non-alcoholic fluids. Resume your normal diet. Include foods high in fiber.  ACTIVITY:  You may use crutches or a walker with weight-bearing as tolerated, unless instructed otherwise. You may be weaned off of the walker or crutches by your Physical Therapist.  Do NOT place pillows under the knee. Anything placed under the knee could limit your ability to straighten the knee.   Continue doing gentle exercises. Exercising will reduce the pain and swelling, increase motion, and prevent muscle weakness.   Please continue to use the TED compression stockings for 6 weeks. You may remove the stockings at night, but should reapply them in the morning. Do not drive or operate any equipment until instructed.  WOUND CARE:  Continue to use the PolarCare or ice packs periodically to reduce pain and swelling. You may bathe or shower after the staples are removed at the first office visit following surgery.  MEDICATIONS: You may resume your regular medications. Please take the pain medication as prescribed on the medication. Do not take pain medication on an empty stomach. You have been given a prescription for a blood thinner (Lovenox or Coumadin). Please take the medication as instructed. (NOTE: After completing a 2 week course of Lovenox, take one Enteric-coated aspirin once a day. This along with elevation will help reduce the possibility of phlebitis in your operated leg.) Do not drive or drink alcoholic beverages when taking pain medications.  CALL THE OFFICE FOR: Temperature above 101 degrees Excessive bleeding or drainage on the dressing. Excessive swelling, coldness, or paleness of the toes. Persistent nausea and vomiting.  FOLLOW-UP:  You  should have an appointment to return to the office in 10-14 days after surgery. Arrangements have been made for continuation of Physical Therapy (either home therapy or outpatient therapy).   [facility] Department Directory         [email or web address]       [email or web address]          Cardiology  Appointments: [place] - [phone] [place] - [phone]  Endocrinology  Appointments: [place] - [phone] [place] - [phone]  Gastroenterology  Appointments: [place] - [phone] [place] - [phone]        General Surgery   Appointments: [place] - [phone]  Internal Medicine/Family Medicine  Appointments: [place] - [phone] [place] - [phone] [place] - [phone]  Metabolic and Weigh Loss Surgery  Appointments: [place] - [phone]        Neurology  Appointments: [place] - [phone] [place] - [phone]  Neurosurgery  Appointments: [place] - [phone]  Obstetrics & Gynecology  Appointments: [place] - [phone] [place] - [phone]        Pediatrics  Appointments: [place] - [phone] [place] - [phone]  Physiatry  Appointments: [place] -[phone]  Physical Therapy  Appointments: [place] - [phone] [place] - [phone]        Podiatry  Appointments: [place] - [phone] [place] - [phone]  Pulmonology  Appointments: [place] - [phone]  Rheumatology  Appointments: [place] - [phone]        [place] Location: [facility]  [address] [place], [place]  [address]  [place] Location: [facility] [address] [place], [place]  [address]  [place] Location: [facility] [address] [place], [place]  [address]    

## 2019-04-19 ENCOUNTER — Encounter
Admission: RE | Admit: 2019-04-19 | Discharge: 2019-04-19 | Disposition: A | Discharge status: Home / Self Care | Payer: 59 | Source: Ambulatory Visit | Attending: Orthopedic Surgery | Admitting: Orthopedic Surgery

## 2019-04-19 ENCOUNTER — Other Ambulatory Visit: Payer: Self-pay

## 2019-04-19 DIAGNOSIS — Z01818 Encounter for other preprocedural examination: Secondary | ICD-10-CM | POA: Insufficient documentation

## 2019-04-19 HISTORY — DX: Anemia, unspecified: D64.9

## 2019-04-19 HISTORY — DX: Unspecified osteoarthritis, unspecified site: M19.90

## 2019-04-19 LAB — URINALYSIS, ROUTINE W REFLEX MICROSCOPIC
Bacteria, UA: NONE SEEN
Bilirubin Urine: NEGATIVE
Glucose, UA: NEGATIVE mg/dL
Ketones, ur: NEGATIVE mg/dL
Leukocytes,Ua: NEGATIVE
Nitrite: NEGATIVE
Protein, ur: NEGATIVE mg/dL
Specific Gravity, Urine: 1.008 (ref 1.005–1.030)
pH: 5 (ref 5.0–8.0)

## 2019-04-19 LAB — COMPREHENSIVE METABOLIC PANEL
ALT: 15 U/L (ref 0–44)
AST: 17 U/L (ref 15–41)
Albumin: 3.4 g/dL — ABNORMAL LOW (ref 3.5–5.0)
Alkaline Phosphatase: 71 U/L (ref 38–126)
Anion gap: 11 (ref 5–15)
BUN: 39 mg/dL — ABNORMAL HIGH (ref 8–23)
CO2: 21 mmol/L — ABNORMAL LOW (ref 22–32)
Calcium: 9.2 mg/dL (ref 8.9–10.3)
Chloride: 106 mmol/L (ref 98–111)
Creatinine, Ser: 1.75 mg/dL — ABNORMAL HIGH (ref 0.44–1.00)
GFR calc Af Amer: 34 mL/min — ABNORMAL LOW (ref >60)
GFR calc non Af Amer: 30 mL/min — ABNORMAL LOW (ref >60)
Glucose, Bld: 113 mg/dL — ABNORMAL HIGH (ref 70–99)
Potassium: 3.7 mmol/L (ref 3.5–5.1)
Sodium: 138 mmol/L (ref 135–145)
Total Bilirubin: 0.6 mg/dL (ref 0.3–1.2)
Total Protein: 7.2 g/dL (ref 6.5–8.1)

## 2019-04-19 LAB — TYPE AND SCREEN
ABO/RH(D): B POS
Antibody Screen: NEGATIVE

## 2019-04-19 LAB — CBC
HCT: 34.8 % — ABNORMAL LOW (ref 36.0–46.0)
Hemoglobin: 11 g/dL — ABNORMAL LOW (ref 12.0–15.0)
MCH: 25.9 pg — ABNORMAL LOW (ref 26.0–34.0)
MCHC: 31.6 g/dL (ref 30.0–36.0)
MCV: 81.9 fL (ref 80.0–100.0)
Platelets: 272 10*3/uL (ref 150–400)
RBC: 4.25 MIL/uL (ref 3.87–5.11)
RDW: 15.4 % (ref 11.5–15.5)
WBC: 9.7 10*3/uL (ref 4.0–10.5)
nRBC: 0 % (ref 0.0–0.2)

## 2019-04-19 LAB — SURGICAL PCR SCREEN
MRSA, PCR: NEGATIVE
Staphylococcus aureus: NEGATIVE

## 2019-04-19 LAB — PROTIME-INR
INR: 1.1 (ref 0.8–1.2)
Prothrombin Time: 14.1 seconds (ref 11.4–15.2)

## 2019-04-19 LAB — HEMOGLOBIN A1C
Hgb A1c MFr Bld: 6.1 % — ABNORMAL HIGH (ref 4.8–5.6)
Mean Plasma Glucose: 128.37 mg/dL

## 2019-04-19 LAB — C-REACTIVE PROTEIN: CRP: 0.8 mg/dL (ref <1.0)

## 2019-04-19 LAB — SEDIMENTATION RATE: Sed Rate: 56 mm/hr — ABNORMAL HIGH (ref 0–30)

## 2019-04-19 LAB — APTT: aPTT: 29 seconds (ref 24–36)

## 2019-04-19 NOTE — Patient Instructions (Addendum)
Your procedure is scheduled on: 04-28-19 [facility] Report to Same Day Surgery 2nd floor medical mall [facility] Entrance-take elevator on left to 2nd floor.  Check in with surgery information desk.) To find out your arrival time please call [phone] between 1PM - 3PM on 04-27-19 TUESDAY  Remember: Instructions that are not followed completely may result in serious medical risk, up to and including death, or upon the discretion of your surgeon and anesthesiologist your surgery may need to be rescheduled.    _x___ 1. Do not eat food after midnight the night before your procedure. NO GUM OR CANDY AFTER MIDNIGHT. You may drink clear liquids up to 2 hours before you are scheduled to arrive at the hospital for your procedure.  Do not drink clear liquids within 2 hours of your scheduled arrival to the hospital.  Clear liquids include  --Water or Apple juice without pulp  --Gatorade  --Black Coffee or Clear Tea (No milk, no creamers, do not add anything to the coffee or Tea   ____Ensure clear carbohydrate drink on the way to the hospital for bariatric patients  _X___Ensure clear carbohydrate drink 3 hours before surgery.     __x__ 2. No Alcohol for 24 hours before or after surgery.   __x__3. No Smoking or e-cigarettes for 24 prior to surgery.  Do not use any chewable tobacco products for at least 6 hour prior to surgery   ____  4. Bring all medications with you on the day of surgery if instructed.    __x__ 5. Notify your doctor if there is any change in your medical condition     (cold, fever, infections).    x___6. On the morning of surgery brush your teeth with toothpaste and water.  You may rinse your mouth with mouth wash if you wish.  Do not swallow any toothpaste or mouthwash.   Do not wear jewelry, make-up, hairpins, clips or nail polish.  Do not wear lotions, powders, or perfumes.  Do not shave 48 hours prior to surgery. Men may shave face and neck.  Do not bring valuables to  the hospital.    [facility] is not responsible for any belongings or valuables.               Contacts, dentures or bridgework may not be worn into surgery.  Leave your suitcase in the car. After surgery it may be brought to your room.  For patients admitted to the hospital, discharge time is determined by your treatment team.  _  Patients discharged the day of surgery will not be allowed to drive home.  You will need someone to drive you home and stay with you the night of your procedure.    Please read over the following fact sheets that you were given:   [facility] Preparing for Surgery and or MRSA Information   _x___ TAKE THE FOLLOWING MEDICATION THE MORNING OF SURGERY WITH A SMALL SIP OF WATER. These include:  1. ZYRTEC (CETIRIZINE)  2. PROTONIX (PANTOPRAZOLE)  3. TAKE AN EXTRA PROTONIX THE NIGHT BEFORE YOUR SURGERY  4.   5.  6.  ____Fleets enema or Magnesium Citrate as directed.   _x___ Use CHG Soap or sage wipes as directed on instruction sheet   _X___ Use inhalers on the day of surgery and bring to hospital day of surgery-USE YOUR SYMBICORT AND YOUR ALBUTEROL NEBULIZER AM OF SURGERY   ____ Stop Metformin and Janumet 2 days prior to surgery.    ____ Take  1/2 of usual insulin dose the night before surgery and none on the morning surgery.   ____ Follow recommendations from Cardiologist, Pulmonologist or PCP regarding stopping Aspirin, Coumadin, Plavix ,Eliquis, Effient, or Pradaxa, and Pletal.  X____Stop Anti-inflammatories such as Advil, Aleve, Ibuprofen, Motrin, Naproxen, Naprosyn, Goodies powders or aspirin products 7 DAYS PRIOR TO SURGERY-OK to take Tylenol    ____ Stop supplements until after surgery.   _X___ Bring C-Pap to the hospital.

## 2019-04-19 NOTE — Pre-Procedure Instructions (Signed)
PULMONARY CLEARANCE ON CHART-MODERATE RISK

## 2019-04-20 ENCOUNTER — Other Ambulatory Visit: Payer: 59

## 2019-04-20 LAB — URINE CULTURE
Culture: <10000 — AB
Special Requests: NORMAL

## 2019-04-21 ENCOUNTER — Other Ambulatory Visit: Payer: Self-pay | Admitting: *Deleted

## 2019-04-21 LAB — IGE: IgE (Immunoglobulin E), Serum: 34 IU/mL (ref 6–495)

## 2019-04-21 NOTE — Patient Outreach (Signed)
[facility]) Care Management  04/21/2019  Samantha Mason 1950-08-08 [id number]   Preoperative Screening Call Referral received: 04/19/19 Surgery/procedure date: 04/28/19 Insurance: [employer]   Subjective:  Initial successful telephone call to patient's preferred number in order to complete preoperative screening. 2 HIPAA identifiers verified. Discussed purpose of preoperative call. Patient voices understanding and agrees to call. She states she understands the reason for the surgery and will call on 04/27/19 to find out the time of arrival for surgery on 12/9. She   completed her preoperative testing on 04/19/19  and has no additional questions she will return on 12/4 for covid testing.  She says she expects to be in the hospital 2-3 days.  She states that she is currently on shortterm disability due to difficulty with mobility  and she is  in contact  with [employer]. She does have  hospital indemnity benefit and is aware she will have to file the claim after her  surgery. She  says she  will have 24/7 care at home provided by her son  to assist in her recovery. She does have advanced directive in place. She agrees to a post hospital discharge transition of care call.    Objective:  Per chart review, patient scheduled for Total knee arthroplasty Right  . She completed pre-op testing on 04/19/19 .  Assessment: Preoperative call completed, no preoperative needs identified.   Plan: RNCM will call patient for transition of care outreach within 72 hours of hospital discharge notification.   Joylene Draft, RN, [place] Management Coordinator  [phone]- Mobile [phone]- Toll Free Main Office

## 2019-04-23 ENCOUNTER — Other Ambulatory Visit: Payer: Self-pay

## 2019-04-23 ENCOUNTER — Other Ambulatory Visit
Admission: RE | Admit: 2019-04-23 | Discharge: 2019-04-23 | Disposition: A | Discharge status: Home / Self Care | Payer: 59 | Source: Ambulatory Visit | Attending: Orthopedic Surgery | Admitting: Orthopedic Surgery

## 2019-04-23 DIAGNOSIS — Z20828 Contact with and (suspected) exposure to other viral communicable diseases: Secondary | ICD-10-CM | POA: Diagnosis not present

## 2019-04-23 DIAGNOSIS — Z01812 Encounter for preprocedural laboratory examination: Secondary | ICD-10-CM | POA: Insufficient documentation

## 2019-04-23 LAB — SARS CORONAVIRUS 2 (TAT 6-24 HRS): SARS Coronavirus 2: NEGATIVE

## 2019-04-27 ENCOUNTER — Other Ambulatory Visit: Admission: RE | Admit: 2019-04-27 | Payer: 59 | Source: Ambulatory Visit

## 2019-04-28 NOTE — Pre-Procedure Instructions (Signed)
MEDICAL CLEARANCE AND PULMONARY CLEARANCE ON CHART FROM DR [place] DR Mortimer Fries

## 2019-04-29 MED ORDER — TRANEXAMIC ACID-NACL 1000-0.7 MG/100ML-% IV SOLN
1000.0000 mg | INTRAVENOUS | Status: AC
  Administered 2019-04-30: 1000 mg via INTRAVENOUS

## 2019-04-29 MED ORDER — CLINDAMYCIN PHOSPHATE 900 MG/50ML IV SOLN
900.0000 mg | INTRAVENOUS | Status: AC
  Administered 2019-04-30: 900 mg via INTRAVENOUS

## 2019-04-30 ENCOUNTER — Inpatient Hospital Stay: Payer: 59

## 2019-04-30 ENCOUNTER — Other Ambulatory Visit: Payer: Self-pay

## 2019-04-30 ENCOUNTER — Encounter
Admission: RE | Disposition: A | Discharge status: Home Health | Payer: Self-pay | Source: Home / Self Care | Attending: Orthopedic Surgery

## 2019-04-30 ENCOUNTER — Encounter: Payer: Self-pay | Admitting: Orthopedic Surgery

## 2019-04-30 ENCOUNTER — Inpatient Hospital Stay
Admission: RE | Admit: 2019-04-30 | Discharge: 2019-05-02 | DRG: 470 | Disposition: A | Discharge status: Home Health | Payer: 59 | Attending: Orthopedic Surgery | Admitting: Orthopedic Surgery

## 2019-04-30 ENCOUNTER — Inpatient Hospital Stay: Payer: 59 | Admitting: Anesthesiology

## 2019-04-30 DIAGNOSIS — Z8249 Family history of ischemic heart disease and other diseases of the circulatory system: Secondary | ICD-10-CM

## 2019-04-30 DIAGNOSIS — Z6841 Body Mass Index (BMI) 40.0 and over, adult: Secondary | ICD-10-CM | POA: Diagnosis not present

## 2019-04-30 DIAGNOSIS — Z841 Family history of disorders of kidney and ureter: Secondary | ICD-10-CM

## 2019-04-30 DIAGNOSIS — I10 Essential (primary) hypertension: Secondary | ICD-10-CM | POA: Diagnosis not present

## 2019-04-30 DIAGNOSIS — Z825 Family history of asthma and other chronic lower respiratory diseases: Secondary | ICD-10-CM | POA: Diagnosis not present

## 2019-04-30 DIAGNOSIS — Z96652 Presence of left artificial knee joint: Secondary | ICD-10-CM | POA: Diagnosis present

## 2019-04-30 DIAGNOSIS — E669 Obesity, unspecified: Secondary | ICD-10-CM | POA: Diagnosis not present

## 2019-04-30 DIAGNOSIS — K219 Gastro-esophageal reflux disease without esophagitis: Secondary | ICD-10-CM | POA: Diagnosis present

## 2019-04-30 DIAGNOSIS — Z8673 Personal history of transient ischemic attack (TIA), and cerebral infarction without residual deficits: Secondary | ICD-10-CM | POA: Diagnosis not present

## 2019-04-30 DIAGNOSIS — J449 Chronic obstructive pulmonary disease, unspecified: Secondary | ICD-10-CM | POA: Diagnosis present

## 2019-04-30 DIAGNOSIS — Z79899 Other long term (current) drug therapy: Secondary | ICD-10-CM

## 2019-04-30 DIAGNOSIS — D519 Vitamin B12 deficiency anemia, unspecified: Secondary | ICD-10-CM | POA: Diagnosis present

## 2019-04-30 DIAGNOSIS — G4733 Obstructive sleep apnea (adult) (pediatric): Secondary | ICD-10-CM | POA: Diagnosis not present

## 2019-04-30 DIAGNOSIS — E559 Vitamin D deficiency, unspecified: Secondary | ICD-10-CM | POA: Diagnosis present

## 2019-04-30 DIAGNOSIS — Z96651 Presence of right artificial knee joint: Secondary | ICD-10-CM | POA: Diagnosis not present

## 2019-04-30 DIAGNOSIS — M1711 Unilateral primary osteoarthritis, right knee: Principal | ICD-10-CM | POA: Diagnosis present

## 2019-04-30 DIAGNOSIS — G2581 Restless legs syndrome: Secondary | ICD-10-CM | POA: Diagnosis present

## 2019-04-30 DIAGNOSIS — F418 Other specified anxiety disorders: Secondary | ICD-10-CM | POA: Diagnosis not present

## 2019-04-30 DIAGNOSIS — Z7951 Long term (current) use of inhaled steroids: Secondary | ICD-10-CM | POA: Diagnosis not present

## 2019-04-30 DIAGNOSIS — Z96659 Presence of unspecified artificial knee joint: Secondary | ICD-10-CM

## 2019-04-30 DIAGNOSIS — Z471 Aftercare following joint replacement surgery: Secondary | ICD-10-CM | POA: Diagnosis not present

## 2019-04-30 HISTORY — PX: KNEE ARTHROPLASTY: SHX992

## 2019-04-30 LAB — ABO/RH: ABO/RH(D): B POS

## 2019-04-30 SURGERY — ARTHROPLASTY, KNEE, TOTAL, USING IMAGELESS COMPUTER-ASSISTED NAVIGATION
Anesthesia: Spinal | Site: Knee | Laterality: Right

## 2019-04-30 MED ORDER — TRAMADOL HCL 50 MG PO TABS
50.0000 mg | ORAL_TABLET | ORAL | Status: DC | PRN
  Administered 2019-05-02: 50 mg via ORAL
  Filled 2019-04-30: qty 1

## 2019-04-30 MED ORDER — CLINDAMYCIN PHOSPHATE 900 MG/50ML IV SOLN
INTRAVENOUS | Status: AC
  Filled 2019-04-30: qty 50

## 2019-04-30 MED ORDER — ALBUTEROL SULFATE (2.5 MG/3ML) 0.083% IN NEBU: 1.5000 mL | INHALATION_SOLUTION | Freq: Four times a day (QID) | RESPIRATORY_TRACT | Status: DC | PRN

## 2019-04-30 MED ORDER — PROPOFOL 500 MG/50ML IV EMUL
INTRAVENOUS | Status: AC
  Filled 2019-04-30: qty 50

## 2019-04-30 MED ORDER — VITAMIN B-12 1000 MCG PO TABS
1000.0000 ug | ORAL_TABLET | Freq: Every day | ORAL | Status: DC
  Administered 2019-05-01 – 2019-05-02 (×2): 1000 ug via ORAL
  Filled 2019-04-30 (×2): qty 1

## 2019-04-30 MED ORDER — BUDESONIDE-FORMOTEROL FUMARATE 160-4.5 MCG/ACT IN AERO
2.0000 | INHALATION_SPRAY | Freq: Two times a day (BID) | RESPIRATORY_TRACT | Status: DC
  Administered 2019-04-30 – 2019-05-02 (×4): 2 via RESPIRATORY_TRACT
  Filled 2019-04-30: qty 6

## 2019-04-30 MED ORDER — TETRACAINE HCL 1 % IJ SOLN
INTRAMUSCULAR | Status: DC | PRN
  Administered 2019-04-30: 3 mg via INTRASPINAL

## 2019-04-30 MED ORDER — GABAPENTIN 300 MG PO CAPS
ORAL_CAPSULE | ORAL | Status: AC
  Administered 2019-04-30: 300 mg via ORAL
  Filled 2019-04-30: qty 1

## 2019-04-30 MED ORDER — OXYCODONE HCL 5 MG PO TABS
5.0000 mg | ORAL_TABLET | Freq: Once | ORAL | Status: AC | PRN
  Administered 2019-04-30: 5 mg via ORAL

## 2019-04-30 MED ORDER — SODIUM CHLORIDE 0.9 % IV SOLN
INTRAVENOUS | Status: DC | PRN
  Administered 2019-04-30: 20 ug/min via INTRAVENOUS

## 2019-04-30 MED ORDER — CLINDAMYCIN PHOSPHATE 600 MG/50ML IV SOLN
600.0000 mg | Freq: Four times a day (QID) | INTRAVENOUS | Status: AC
  Administered 2019-04-30 – 2019-05-01 (×4): 600 mg via INTRAVENOUS
  Filled 2019-04-30 (×4): qty 50

## 2019-04-30 MED ORDER — HYDROMORPHONE HCL 1 MG/ML IJ SOLN: 0.5000 mg | INTRAMUSCULAR | Status: DC | PRN

## 2019-04-30 MED ORDER — CELECOXIB 200 MG PO CAPS
200.0000 mg | ORAL_CAPSULE | Freq: Two times a day (BID) | ORAL | Status: DC
  Administered 2019-04-30 – 2019-05-02 (×3): 200 mg via ORAL
  Filled 2019-04-30 (×4): qty 1

## 2019-04-30 MED ORDER — VITAMIN D (ERGOCALCIFEROL) 1.25 MG (50000 UNIT) PO CAPS
50000.0000 [IU] | ORAL_CAPSULE | ORAL | Status: DC
  Filled 2019-04-30: qty 1

## 2019-04-30 MED ORDER — TRANEXAMIC ACID-NACL 1000-0.7 MG/100ML-% IV SOLN
INTRAVENOUS | Status: AC
  Administered 2019-04-30: 1000 mg via INTRAVENOUS
  Filled 2019-04-30: qty 100

## 2019-04-30 MED ORDER — LORATADINE 10 MG PO TABS
10.0000 mg | ORAL_TABLET | Freq: Every day | ORAL | Status: DC
  Administered 2019-05-01 – 2019-05-02 (×2): 10 mg via ORAL
  Filled 2019-04-30 (×2): qty 1

## 2019-04-30 MED ORDER — ALUM & MAG HYDROXIDE-SIMETH 200-200-20 MG/5ML PO SUSP: 30.0000 mL | ORAL | Status: DC | PRN

## 2019-04-30 MED ORDER — SODIUM CHLORIDE 0.9 % IV SOLN
INTRAVENOUS | Status: DC
  Administered 2019-04-30 (×2): via INTRAVENOUS

## 2019-04-30 MED ORDER — MIDAZOLAM HCL 2 MG/2ML IJ SOLN
INTRAMUSCULAR | Status: AC
  Filled 2019-04-30: qty 2

## 2019-04-30 MED ORDER — MAGNESIUM HYDROXIDE 400 MG/5ML PO SUSP
30.0000 mL | Freq: Every day | ORAL | Status: DC
  Filled 2019-04-30 (×2): qty 30

## 2019-04-30 MED ORDER — OXYCODONE HCL 5 MG/5ML PO SOLN: 5.0000 mg | Freq: Once | ORAL | Status: AC | PRN

## 2019-04-30 MED ORDER — GABAPENTIN 300 MG PO CAPS: 300.0000 mg | ORAL_CAPSULE | Freq: Once | ORAL | Status: AC

## 2019-04-30 MED ORDER — MENTHOL 3 MG MT LOZG
1.0000 | LOZENGE | OROMUCOSAL | Status: DC | PRN
  Filled 2019-04-30 (×2): qty 9

## 2019-04-30 MED ORDER — ACETAMINOPHEN 10 MG/ML IV SOLN
1000.0000 mg | Freq: Four times a day (QID) | INTRAVENOUS | Status: AC
  Administered 2019-04-30 – 2019-05-01 (×4): 1000 mg via INTRAVENOUS
  Filled 2019-04-30 (×4): qty 100

## 2019-04-30 MED ORDER — SENNOSIDES-DOCUSATE SODIUM 8.6-50 MG PO TABS
1.0000 | ORAL_TABLET | Freq: Two times a day (BID) | ORAL | Status: DC
  Administered 2019-04-30 – 2019-05-02 (×4): 1 via ORAL
  Filled 2019-04-30 (×4): qty 1

## 2019-04-30 MED ORDER — PANTOPRAZOLE SODIUM 40 MG PO TBEC
40.0000 mg | DELAYED_RELEASE_TABLET | Freq: Two times a day (BID) | ORAL | Status: DC
  Administered 2019-04-30 – 2019-05-02 (×4): 40 mg via ORAL
  Filled 2019-04-30 (×4): qty 1

## 2019-04-30 MED ORDER — MOMETASONE FURO-FORMOTEROL FUM 200-5 MCG/ACT IN AERO
2.0000 | INHALATION_SPRAY | Freq: Two times a day (BID) | RESPIRATORY_TRACT | Status: DC
  Filled 2019-04-30: qty 8.8

## 2019-04-30 MED ORDER — ACETAMINOPHEN 325 MG PO TABS
325.0000 mg | ORAL_TABLET | Freq: Four times a day (QID) | ORAL | Status: DC | PRN
  Administered 2019-05-02: 650 mg via ORAL
  Filled 2019-04-30: qty 2

## 2019-04-30 MED ORDER — METOCLOPRAMIDE HCL 10 MG PO TABS
10.0000 mg | ORAL_TABLET | Freq: Three times a day (TID) | ORAL | Status: AC
  Administered 2019-04-30 – 2019-05-02 (×7): 10 mg via ORAL
  Filled 2019-04-30 (×7): qty 1

## 2019-04-30 MED ORDER — OXYCODONE HCL 5 MG PO TABS: 10.0000 mg | ORAL_TABLET | ORAL | Status: DC | PRN

## 2019-04-30 MED ORDER — CELECOXIB 200 MG PO CAPS: 400.0000 mg | ORAL_CAPSULE | Freq: Once | ORAL | Status: AC

## 2019-04-30 MED ORDER — BUPIVACAINE HCL (PF) 0.25 % IJ SOLN
INTRAMUSCULAR | Status: AC
  Filled 2019-04-30: qty 60

## 2019-04-30 MED ORDER — TRANEXAMIC ACID-NACL 1000-0.7 MG/100ML-% IV SOLN: 1000.0000 mg | Freq: Once | INTRAVENOUS | Status: AC

## 2019-04-30 MED ORDER — FENTANYL CITRATE (PF) 100 MCG/2ML IJ SOLN
INTRAMUSCULAR | Status: AC
  Administered 2019-04-30: 25 ug via INTRAVENOUS
  Filled 2019-04-30: qty 2

## 2019-04-30 MED ORDER — PHENYLEPHRINE HCL (PRESSORS) 10 MG/ML IV SOLN
INTRAVENOUS | Status: AC
  Filled 2019-04-30: qty 1

## 2019-04-30 MED ORDER — TRANEXAMIC ACID-NACL 1000-0.7 MG/100ML-% IV SOLN
INTRAVENOUS | Status: AC
  Filled 2019-04-30: qty 100

## 2019-04-30 MED ORDER — LISINOPRIL 10 MG PO TABS
10.0000 mg | ORAL_TABLET | ORAL | Status: DC
  Administered 2019-05-01 – 2019-05-02 (×2): 10 mg via ORAL
  Filled 2019-04-30 (×2): qty 1

## 2019-04-30 MED ORDER — MIDAZOLAM HCL 5 MG/5ML IJ SOLN
INTRAMUSCULAR | Status: DC | PRN
  Administered 2019-04-30: 2 mg via INTRAVENOUS

## 2019-04-30 MED ORDER — BUPIVACAINE LIPOSOME 1.3 % IJ SUSP
INTRAMUSCULAR | Status: AC
  Filled 2019-04-30: qty 20

## 2019-04-30 MED ORDER — ENSURE PRE-SURGERY PO LIQD
296.0000 mL | Freq: Once | ORAL | Status: AC
  Administered 2019-04-30: 296 mL via ORAL
  Filled 2019-04-30: qty 296

## 2019-04-30 MED ORDER — FENTANYL CITRATE (PF) 100 MCG/2ML IJ SOLN
25.0000 ug | INTRAMUSCULAR | Status: DC | PRN
  Administered 2019-04-30 (×3): 25 ug via INTRAVENOUS

## 2019-04-30 MED ORDER — CHLORHEXIDINE GLUCONATE 4 % EX LIQD
60.0000 mL | Freq: Once | CUTANEOUS | Status: AC
  Administered 2019-04-30: 4 via TOPICAL

## 2019-04-30 MED ORDER — FLEET ENEMA 7-19 GM/118ML RE ENEM: 1.0000 | ENEMA | Freq: Once | RECTAL | Status: DC | PRN

## 2019-04-30 MED ORDER — OXYCODONE HCL 5 MG PO TABS
5.0000 mg | ORAL_TABLET | ORAL | Status: DC | PRN
  Administered 2019-04-30 – 2019-05-02 (×3): 5 mg via ORAL
  Filled 2019-04-30 (×3): qty 1

## 2019-04-30 MED ORDER — LACTATED RINGERS IV SOLN
INTRAVENOUS | Status: DC
  Administered 2019-04-30 (×2): via INTRAVENOUS

## 2019-04-30 MED ORDER — NEOMYCIN-POLYMYXIN B GU 40-200000 IR SOLN
Status: AC
  Filled 2019-04-30: qty 20

## 2019-04-30 MED ORDER — ONDANSETRON HCL 4 MG PO TABS: 4.0000 mg | ORAL_TABLET | Freq: Four times a day (QID) | ORAL | Status: DC | PRN

## 2019-04-30 MED ORDER — BISACODYL 10 MG RE SUPP: 10.0000 mg | Freq: Every day | RECTAL | Status: DC | PRN

## 2019-04-30 MED ORDER — PHENOL 1.4 % MT LIQD
1.0000 | OROMUCOSAL | Status: DC | PRN
  Filled 2019-04-30: qty 177

## 2019-04-30 MED ORDER — ACETAMINOPHEN 10 MG/ML IV SOLN
INTRAVENOUS | Status: DC | PRN
  Administered 2019-04-30: 1000 mg via INTRAVENOUS

## 2019-04-30 MED ORDER — FERROUS SULFATE 325 (65 FE) MG PO TABS
325.0000 mg | ORAL_TABLET | Freq: Two times a day (BID) | ORAL | Status: DC
  Administered 2019-04-30 – 2019-05-02 (×4): 325 mg via ORAL
  Filled 2019-04-30 (×4): qty 1

## 2019-04-30 MED ORDER — NEOMYCIN-POLYMYXIN B GU 40-200000 IR SOLN
Status: DC | PRN
  Administered 2019-04-30: 14 mL

## 2019-04-30 MED ORDER — CHOLESTYRAMINE 4 G PO PACK
4.0000 g | PACK | Freq: Every day | ORAL | Status: DC
  Administered 2019-05-01: 4 g via ORAL
  Filled 2019-04-30 (×3): qty 1

## 2019-04-30 MED ORDER — METOCLOPRAMIDE HCL 5 MG/ML IJ SOLN: 5.0000 mg | Freq: Three times a day (TID) | INTRAMUSCULAR | Status: DC | PRN

## 2019-04-30 MED ORDER — METOCLOPRAMIDE HCL 10 MG PO TABS: 5.0000 mg | ORAL_TABLET | Freq: Three times a day (TID) | ORAL | Status: DC | PRN

## 2019-04-30 MED ORDER — BUPIVACAINE HCL (PF) 0.25 % IJ SOLN
INTRAMUSCULAR | Status: DC | PRN
  Administered 2019-04-30: 60 mL

## 2019-04-30 MED ORDER — ROPINIROLE HCL 1 MG PO TABS
1.0000 mg | ORAL_TABLET | Freq: Two times a day (BID) | ORAL | Status: DC
  Administered 2019-04-30 – 2019-05-02 (×4): 1 mg via ORAL
  Filled 2019-04-30 (×4): qty 1

## 2019-04-30 MED ORDER — SODIUM CHLORIDE 0.9 % IV SOLN
INTRAVENOUS | Status: DC | PRN
  Administered 2019-04-30: 60 mL

## 2019-04-30 MED ORDER — SODIUM CHLORIDE FLUSH 0.9 % IV SOLN
INTRAVENOUS | Status: AC
  Filled 2019-04-30: qty 40

## 2019-04-30 MED ORDER — ACETAMINOPHEN 10 MG/ML IV SOLN
INTRAVENOUS | Status: AC
  Filled 2019-04-30: qty 100

## 2019-04-30 MED ORDER — BUPIVACAINE HCL (PF) 0.5 % IJ SOLN
INTRAMUSCULAR | Status: DC | PRN
  Administered 2019-04-30: 2.5 mL

## 2019-04-30 MED ORDER — DEXAMETHASONE SODIUM PHOSPHATE 10 MG/ML IJ SOLN
INTRAMUSCULAR | Status: AC
  Administered 2019-04-30: 8 mg via INTRAVENOUS
  Filled 2019-04-30: qty 1

## 2019-04-30 MED ORDER — DIPHENOXYLATE-ATROPINE 2.5-0.025 MG PO TABS: 1.0000 | ORAL_TABLET | Freq: Three times a day (TID) | ORAL | Status: DC | PRN

## 2019-04-30 MED ORDER — OXYCODONE HCL 5 MG PO TABS
ORAL_TABLET | ORAL | Status: AC
  Filled 2019-04-30: qty 1

## 2019-04-30 MED ORDER — ONDANSETRON HCL 4 MG/2ML IJ SOLN: 4.0000 mg | Freq: Four times a day (QID) | INTRAMUSCULAR | Status: DC | PRN

## 2019-04-30 MED ORDER — HYDROMORPHONE HCL 1 MG/ML IJ SOLN
INTRAMUSCULAR | Status: AC
  Administered 2019-04-30: 0.25 mg via INTRAVENOUS
  Filled 2019-04-30: qty 1

## 2019-04-30 MED ORDER — DIPHENHYDRAMINE HCL 12.5 MG/5ML PO ELIX: 12.5000 mg | ORAL_SOLUTION | ORAL | Status: DC | PRN

## 2019-04-30 MED ORDER — ENOXAPARIN SODIUM 30 MG/0.3ML ~~LOC~~ SOLN
30.0000 mg | Freq: Two times a day (BID) | SUBCUTANEOUS | Status: DC
  Administered 2019-05-01 – 2019-05-02 (×3): 30 mg via SUBCUTANEOUS
  Filled 2019-04-30 (×3): qty 0.3

## 2019-04-30 MED ORDER — PROPOFOL 500 MG/50ML IV EMUL
INTRAVENOUS | Status: DC | PRN
  Administered 2019-04-30: 75 ug/kg/min via INTRAVENOUS

## 2019-04-30 MED ORDER — DEXAMETHASONE SODIUM PHOSPHATE 10 MG/ML IJ SOLN: 8.0000 mg | Freq: Once | INTRAMUSCULAR | Status: AC

## 2019-04-30 MED ORDER — CELECOXIB 200 MG PO CAPS
ORAL_CAPSULE | ORAL | Status: AC
  Administered 2019-04-30: 06:00:00 400 mg via ORAL
  Filled 2019-04-30: qty 2

## 2019-04-30 MED ORDER — GABAPENTIN 300 MG PO CAPS
300.0000 mg | ORAL_CAPSULE | Freq: Every day | ORAL | Status: DC
  Administered 2019-04-30: 21:00:00 300 mg via ORAL
  Filled 2019-04-30 (×2): qty 1

## 2019-04-30 MED ORDER — HYDROMORPHONE HCL 1 MG/ML IJ SOLN
0.2500 mg | INTRAMUSCULAR | Status: DC | PRN
  Administered 2019-04-30 (×3): 0.25 mg via INTRAVENOUS

## 2019-04-30 MED ORDER — HYDROCHLOROTHIAZIDE 25 MG PO TABS
25.0000 mg | ORAL_TABLET | Freq: Every day | ORAL | Status: DC
  Administered 2019-05-01 – 2019-05-02 (×2): 25 mg via ORAL
  Filled 2019-04-30 (×2): qty 1

## 2019-04-30 SURGICAL SUPPLY — 77 items
BATTERY INSTRU NAVIGATION (MISCELLANEOUS) ×8 IMPLANT
BLADE SAW 70X12.5 (BLADE) ×2 IMPLANT
BLADE SAW 90X13X1.19 OSCILLAT (BLADE) ×2 IMPLANT
BLADE SAW 90X25X1.19 OSCILLAT (BLADE) ×2 IMPLANT
BONE CEMENT GENTAMICIN (Cement) ×4 IMPLANT
BTRY SRG DRVR LF (MISCELLANEOUS) ×4
CANISTER SUCT 3000ML PPV (MISCELLANEOUS) ×2 IMPLANT
CEMENT BONE GENTAMICIN 40 (Cement) IMPLANT
CEMENT TIBIA MBT SIZE 4 (Knees) IMPLANT
COOLER ICEMAN CLASSIC (MISCELLANEOUS) ×2 IMPLANT
COVER WAND RF STERILE (DRAPES) ×2 IMPLANT
CUFF TOURN SGL QUICK 24 (TOURNIQUET CUFF)
CUFF TOURN SGL QUICK 30 (TOURNIQUET CUFF)
CUFF TOURN SGL QUICK 34 (TOURNIQUET CUFF) ×2
CUFF TRNQT CYL 24X4X16.5-23 (TOURNIQUET CUFF) IMPLANT
CUFF TRNQT CYL 30X4X21-28X (TOURNIQUET CUFF) IMPLANT
CUFF TRNQT CYL 34X4.125X (TOURNIQUET CUFF) IMPLANT
DRAPE 3/4 80X56 (DRAPES) ×2 IMPLANT
DRSG DERMACEA 8X12 NADH (GAUZE/BANDAGES/DRESSINGS) ×2 IMPLANT
DRSG OPSITE POSTOP 4X14 (GAUZE/BANDAGES/DRESSINGS) ×2 IMPLANT
DRSG TEGADERM 4X4.75 (GAUZE/BANDAGES/DRESSINGS) ×2 IMPLANT
DURAPREP 26ML APPLICATOR (WOUND CARE) ×4 IMPLANT
ELECT REM PT RETURN 9FT ADLT (ELECTROSURGICAL) ×2
ELECTRODE REM PT RTRN 9FT ADLT (ELECTROSURGICAL) ×1 IMPLANT
EX-PIN ORTHOLOCK NAV 4X150 (PIN) ×4 IMPLANT
FEMUR SIGMA PS SZ 3.0 R (Femur) ×1 IMPLANT
GLOVE BIO SURGEON STRL SZ7.5 (GLOVE) ×4 IMPLANT
GLOVE BIOGEL M STRL SZ7.5 (GLOVE) ×4 IMPLANT
GLOVE BIOGEL PI IND STRL 7.5 (GLOVE) ×1 IMPLANT
GLOVE BIOGEL PI INDICATOR 7.5 (GLOVE) ×6
GLOVE INDICATOR 8.0 STRL GRN (GLOVE) ×2 IMPLANT
GOWN STRL REUS W/ TWL LRG LVL3 (GOWN DISPOSABLE) ×2 IMPLANT
GOWN STRL REUS W/ TWL XL LVL3 (GOWN DISPOSABLE) ×1 IMPLANT
GOWN STRL REUS W/TWL LRG LVL3 (GOWN DISPOSABLE) ×4
GOWN STRL REUS W/TWL XL LVL3 (GOWN DISPOSABLE) ×2
HEMOVAC 400CC 10FR (MISCELLANEOUS) ×2 IMPLANT
HOLDER FOLEY CATH W/STRAP (MISCELLANEOUS) ×2 IMPLANT
HOOD PEEL AWAY FLYTE STAYCOOL (MISCELLANEOUS) ×4 IMPLANT
INSERT TIBIAL PFC SIG SZ3 10MM (Knees) ×1 IMPLANT
KIT TURNOVER KIT A (KITS) ×2 IMPLANT
KNIFE SCULPS 14X20 (INSTRUMENTS) ×2 IMPLANT
LABEL OR SOLS (LABEL) ×2 IMPLANT
MANIFOLD NEPTUNE II (INSTRUMENTS) ×2 IMPLANT
NDL SAFETY ECLIPSE 18X1.5 (NEEDLE) ×1 IMPLANT
NDL SPNL 20GX3.5 QUINCKE YW (NEEDLE) ×2 IMPLANT
NEEDLE HYPO 18GX1.5 SHARP (NEEDLE) ×2
NEEDLE SPNL 20GX3.5 QUINCKE YW (NEEDLE) ×4 IMPLANT
NS IRRIG 500ML POUR BTL (IV SOLUTION) ×2 IMPLANT
PACK TOTAL KNEE (MISCELLANEOUS) ×2 IMPLANT
PAD WRAPON POLAR KNEE (MISCELLANEOUS) ×1 IMPLANT
PATELLA DOME PFC 35MM (Knees) ×1 IMPLANT
PENCIL SMOKE ULTRAEVAC 22 CON (MISCELLANEOUS) ×2 IMPLANT
PIN DRILL QUICK PACK ×2 IMPLANT
PIN FIXATION 1/8DIA X 3INL (PIN) ×6 IMPLANT
PULSAVAC PLUS IRRIG FAN TIP (DISPOSABLE) ×2
SOL .9 NS 3000ML IRR  AL (IV SOLUTION) ×1
SOL .9 NS 3000ML IRR AL (IV SOLUTION) ×1
SOL .9 NS 3000ML IRR UROMATIC (IV SOLUTION) ×1 IMPLANT
SOL PREP PVP 2OZ (MISCELLANEOUS) ×2
SOLUTION PREP PVP 2OZ (MISCELLANEOUS) ×1 IMPLANT
SPONGE DRAIN TRACH 4X4 STRL 2S (GAUZE/BANDAGES/DRESSINGS) ×2 IMPLANT
STAPLER SKIN PROX 35W (STAPLE) ×2 IMPLANT
STOCKINETTE IMPERV 14X48 (MISCELLANEOUS) IMPLANT
STRAP TIBIA SHORT (MISCELLANEOUS) ×2 IMPLANT
SUCTION FRAZIER HANDLE 10FR (MISCELLANEOUS) ×1
SUCTION TUBE FRAZIER 10FR DISP (MISCELLANEOUS) ×1 IMPLANT
SUT VIC AB 0 CT1 36 (SUTURE) ×3 IMPLANT
SUT VIC AB 1 CT1 36 (SUTURE) ×4 IMPLANT
SUT VIC AB 2-0 CT2 27 (SUTURE) ×2 IMPLANT
SYR 20ML LL LF (SYRINGE) ×2 IMPLANT
SYR 30ML LL (SYRINGE) ×4 IMPLANT
TIBIA MBT CEMENT SIZE 4 (Knees) ×2 IMPLANT
TIP FAN IRRIG PULSAVAC PLUS (DISPOSABLE) ×1 IMPLANT
TOWEL OR 17X26 4PK STRL BLUE (TOWEL DISPOSABLE) ×2 IMPLANT
TOWER CARTRIDGE SMART MIX (DISPOSABLE) ×2 IMPLANT
TRAY FOLEY MTR SLVR 16FR STAT (SET/KITS/TRAYS/PACK) ×2 IMPLANT
WRAPON POLAR PAD KNEE (MISCELLANEOUS) ×2

## 2019-04-30 NOTE — Evaluation (Signed)
Physical Therapy Evaluation Patient Details Name: Samantha Mason MRN: [id number] DOB: 1950/09/22 Today's Date: 04/30/2019   History of Present Illness  Pt is a 68 yo female diagnosed with degenerative arthrosis of the right knee and is s/p elective R TKA.  PMH includes: HTN, COPD, asthma, anxiety, depression, and GERD.    Clinical Impression  Pt presented with deficits in strength, transfers, mobility, gait, balance, R knee ROM, and activity tolerance but overall performed well especially considering POD#0 status.  Pt did initially c/o mild double vision that resolved during the session but pt continued to c/o stationary objects appearing to move slightly, nursing notified.  Pt required no physical assistance during the session and was able to amb 10' with a RW and step-to pattern with good stability.  Pt reported 8/10 R knee pain at onset of session but was not limited by pain at any point.  Pt should make good progress while in acute care and will benefit from HHPT services upon discharge to safely address above deficits for decreased caregiver assistance and eventual return to PLOF.      Follow Up Recommendations Home health PT;Supervision for mobility/OOB    Equipment Recommendations  Other (comment)(Pt has a BSC and a standard sized RW but would benefit from a bariatric RW)    Recommendations for Other Services       Precautions / Restrictions Precautions Precautions: Fall Precaution Comments: Pt able to perform Ind RLE SLR's without extensor lag, no KI required Restrictions Weight Bearing Restrictions: Yes RLE Weight Bearing: Weight bearing as tolerated      Mobility  Bed Mobility Overal bed mobility: Modified Independent             General bed mobility comments: Extra time and effort during sup to/from sit and use of the bed rail  Transfers Overall transfer level: Needs assistance Equipment used: Rolling walker (2 wheeled) Transfers: Sit to/from Stand Sit to  Stand: Min guard;From elevated surface         General transfer comment: Mod verbal cues for R foot placement during sit to/from stand with pt demonstrating fair concentric and eccentric control  Ambulation/Gait Ambulation/Gait assistance: Min guard Gait Distance (Feet): 10 Feet Assistive device: Rolling walker (2 wheeled) Gait Pattern/deviations: Step-to pattern;Decreased step length - left;Decreased stance time - right;Antalgic Gait velocity: decreased   General Gait Details: Mildly antalgic gait pattern on the RLE but steady without LOB or buckling  Stairs            Wheelchair Mobility    Modified Rankin (Stroke Patients Only)       Balance Overall balance assessment: Needs assistance   Sitting balance-Leahy Scale: Normal     Standing balance support: Bilateral upper extremity supported;During functional activity Standing balance-Leahy Scale: Good Standing balance comment: Mod lean on the RW for support but no LOB noted                             Pertinent Vitals/Pain Pain Assessment: 0-10 Pain Score: 8  Pain Location: R knee Pain Descriptors / Indicators: Aching;Sore Pain Intervention(s): Premedicated before session;Monitored during session    Home Living Family/patient expects to be discharged to:: Private residence Living Arrangements: Children;Other (Comment)(Son available 24/7) Available Help at Discharge: Family;Available 24 hours/day Type of Home: House Home Access: Stairs to enter Entrance Stairs-Rails: Left Entrance Stairs-Number of Steps: 1 Home Layout: One level Home Equipment: Bedside commode;Walker - 2 wheels;Walker - 4 wheels;Cane - single  point      Prior Function Level of Independence: Independent         Comments: Pt Ind with amb without an AD HH distances and with a SPC limited community distances, no fall history, Ind with ADLs     Hand Dominance        Extremity/Trunk Assessment   Upper Extremity  Assessment Upper Extremity Assessment: Overall WFL for tasks assessed    Lower Extremity Assessment Lower Extremity Assessment: Generalized weakness;RLE deficits/detail RLE Deficits / Details: Pt with good bilateral ankle AROM with DF and PF strong against manual resistance; R hip flex strength >/= 3/5 RLE: Unable to fully assess due to pain RLE Sensation: WNL RLE Coordination: WNL       Communication   Communication: No difficulties  Cognition Arousal/Alertness: Awake/alert Behavior During Therapy: WFL for tasks assessed/performed Overall Cognitive Status: Within Functional Limits for tasks assessed                                        General Comments      Exercises Total Joint Exercises Ankle Circles/Pumps: Strengthening;AROM;Both;10 reps Quad Sets: AROM;Strengthening;Right;5 reps;10 reps Gluteal Sets: Strengthening;Both;10 reps Heel Slides: AROM;Right;5 reps Hip ABduction/ADduction: AROM;Both;10 reps Straight Leg Raises: AROM;Both;10 reps Long Arc Quad: AROM;Strengthening;Right;10 reps;15 reps Knee Flexion: AROM;Strengthening;Right;10 reps;15 reps Goniometric ROM: R knee AROM: 0-78 Marching in Standing: AROM;Both;5 reps;Standing Other Exercises Other Exercises: Positioning education to promote R knee ext PROM Other Exercises: HEP education and review per handout   Assessment/Plan    PT Assessment Patient needs continued PT services  PT Problem List Decreased strength;Decreased range of motion;Decreased activity tolerance;Decreased balance;Decreased mobility;Decreased knowledge of use of DME       PT Treatment Interventions DME instruction;Gait training;Stair training;Functional mobility training;Therapeutic activities;Therapeutic exercise;Balance training;Patient/family education    PT Goals (Current goals can be found in the Care Plan section)  Acute Rehab PT Goals Patient Stated Goal: To be able to walk and not hurt PT Goal Formulation: With  patient Time For Goal Achievement: 05/13/19 Potential to Achieve Goals: Good    Frequency BID   Barriers to discharge        Co-evaluation               AM-PAC PT "6 Clicks" Mobility  Outcome Measure Help needed turning from your back to your side while in a flat bed without using bedrails?: A Little Help needed moving from lying on your back to sitting on the side of a flat bed without using bedrails?: A Little Help needed moving to and from a bed to a chair (including a wheelchair)?: A Little Help needed standing up from a chair using your arms (e.g., wheelchair or bedside chair)?: A Little Help needed to walk in hospital room?: A Little Help needed climbing 3-5 steps with a railing? : A Little 6 Click Score: 18    End of Session Equipment Utilized During Treatment: Gait belt Activity Tolerance: Patient tolerated treatment well Patient left: in bed;with call bell/phone within reach;with bed alarm set;with family/visitor present;with SCD's reapplied;Other (comment)(Polar care to R knee) Nurse Communication: Mobility status;Other (comment)(Pt c/o double vision that resolved and changed to stationary objects slightly moving) PT Visit Diagnosis: Other abnormalities of gait and mobility (R26.89);Muscle weakness (generalized) (M62.81)    Time: 1610-9604 PT Time Calculation (min) (ACUTE ONLY): 56 min   Charges:   PT Evaluation $PT Eval Moderate Complexity: 1  Mod PT Treatments $Therapeutic Exercise: 8-22 mins $Therapeutic Activity: 8-22 mins        D. Royetta Asal PT, DPT 04/30/19, 5:40 PM

## 2019-04-30 NOTE — Anesthesia Procedure Notes (Signed)
Spinal  Patient location during procedure: OR Start time: 04/30/2019 7:32 AM End time: 04/30/2019 7:36 AM Staffing Performed: resident/CRNA  Anesthesiologist: Durenda Hurt, MD Resident/CRNA: Jerrye Noble, CRNA Preanesthetic Checklist Completed: patient identified, IV checked, site marked, risks and benefits discussed, surgical consent, monitors and equipment checked, pre-op evaluation and timeout performed Spinal Block Patient position: sitting Prep: ChloraPrep Patient monitoring: continuous pulse ox and blood pressure Approach: midline Location: L3-4 Injection technique: single-shot Needle Needle type: Pencan  Needle gauge: 24 G Needle length: 9 cm

## 2019-04-30 NOTE — H&P (Signed)
The patient has been re-examined, and the chart reviewed, and there have been no interval changes to the documented history and physical.    The risks, benefits, and alternatives have been discussed at length. The patient expressed understanding of the risks benefits and agreed with plans for surgical intervention.  Mistee Soliman P. Kelvin Sennett, Jr. M.D.    

## 2019-04-30 NOTE — Anesthesia Preprocedure Evaluation (Addendum)
Anesthesia Evaluation  Patient identified by MRN, date of birth, ID band Patient awake    Reviewed: Allergy & Precautions, H&P , NPO status , Patient's Chart, lab work & pertinent test results  Airway Mallampati: II  TM Distance: >3 FB Neck ROM: full    Dental  (+) Teeth Intact   Pulmonary asthma , sleep apnea and Continuous Positive Airway Pressure Ventilation , COPD,           Cardiovascular hypertension,      Neuro/Psych PSYCHIATRIC DISORDERS Anxiety Depression CVA    GI/Hepatic Neg liver ROS, GERD  Controlled,  Endo/Other  Morbid obesity (super morbid obesity BMI 51)  Renal/GU      Musculoskeletal   Abdominal   Peds  Hematology  (+) Blood dyscrasia, anemia ,   Anesthesia Other Findings Past Medical History: No date: Acute bronchitis No date: Anemia No date: Anxiety and depression No date: Arthritis No date: Asthma 03/14/2017: B12 deficiency 07/02/2018: Chest pain 07/24/2018: COPD (chronic obstructive pulmonary disease) ([place]) 07/16/2017: Family history of blood clots No date: Irritable bowel syndrome No date: Malignant hypertension No date: Obesity No date: OSA (obstructive sleep apnea)     Comment:  uses cpap 2017: Pneumonia No date: Restless leg syndrome 1995: Stroke ([place])     Comment:  mild  Past Surgical History: No date: CHOLECYSTECTOMY 03/31/2018: COLONOSCOPY WITH PROPOFOL; N/A     Comment:  Procedure: COLONOSCOPY WITH PROPOFOL;  Surgeon: Lucilla Lame, MD;  Location: [facility] ENDOSCOPY;  Service:               Endoscopy;  Laterality: N/A; 2014: JOINT REPLACEMENT 1998: saliva gland removal 2014: TOTAL KNEE ARTHROPLASTY; Left No date: TUBAL LIGATION; Bilateral  BMI    Body Mass Index: 51.28 kg/m      Reproductive/Obstetrics negative OB ROS                           Anesthesia Physical Anesthesia Plan  ASA: III  Anesthesia Plan: Spinal   Post-op Pain  Management:    Induction:   PONV Risk Score and Plan: Propofol infusion  Airway Management Planned: Natural Airway and Simple Face Mask  Additional Equipment:   Intra-op Plan:   Post-operative Plan:   Informed Consent: I have reviewed the patients History and Physical, chart, labs and discussed the procedure including the risks, benefits and alternatives for the proposed anesthesia with the patient or authorized representative who has indicated his/her understanding and acceptance.     Dental Advisory Given  Plan Discussed with: Anesthesiologist  Anesthesia Plan Comments:        Anesthesia Quick Evaluation

## 2019-04-30 NOTE — Progress Notes (Signed)
Dr. Marry Guan paged per pt request to get Symbicort home medication to replace dulera inhaler. Verbal order for Symbicort placed. MD made aware of blurry vision. VSS. No other symptoms. Denies headache. Pt is not in distress. Will continue to monitor.

## 2019-04-30 NOTE — Progress Notes (Signed)
Patient in recovery room. Came out able to move her toes. Patient states 10/10 pain and wants to bend her knee states she thinks the pain will go away. Unable to bend knee due to surgery. Patient given pain medication.

## 2019-04-30 NOTE — Anesthesia Post-op Follow-up Note (Signed)
Anesthesia QCDR form completed.        

## 2019-04-30 NOTE — Transfer of Care (Signed)
Immediate Anesthesia Transfer of Care Note  Patient: Samantha Mason  Procedure(s) Performed: COMPUTER ASSISTED TOTAL KNEE ARTHROPLASTY (Right Knee)  Patient Location: PACU  Anesthesia Type:Spinal  Level of Consciousness: awake, alert  and oriented  Airway & Oxygen Therapy: Patient Spontanous Breathing  Post-op Assessment: Report given to RN and Post -op Vital signs reviewed and stable  Post vital signs: Reviewed and stable  Last Vitals:  Vitals Value Taken Time  BP 131/68 04/30/19 1219  Temp    Pulse 115 04/30/19 1221  Resp 22 04/30/19 1221  SpO2 96 % 04/30/19 1221  Vitals shown include unvalidated device data.  Last Pain:  Vitals:   04/30/19 0602  TempSrc: Tympanic         Complications: No apparent anesthesia complications

## 2019-04-30 NOTE — Op Note (Signed)
OPERATIVE NOTE  DATE OF SURGERY:  04/30/2019  PATIENT NAME:  Samantha Mason   DOB: Oct 12, 1950  MRN: [id number]  PRE-OPERATIVE DIAGNOSIS: Degenerative arthrosis of the right knee, primary  POST-OPERATIVE DIAGNOSIS:  Same  PROCEDURE:  Right total knee arthroplasty using computer-assisted navigation  SURGEON:  Marciano Sequin. M.D.  ASSISTANT: Cassell Smiles, PA-C (present and scrubbed throughout the case, critical for assistance with exposure, retraction, instrumentation, and closure)  ANESTHESIA: spinal  ESTIMATED BLOOD LOSS: 50 mL  FLUIDS REPLACED: 1500 mL of crystalloid  TOURNIQUET TIME: 128 minutes  DRAINS: 2 medium Hemovac drains  SOFT TISSUE RELEASES: Anterior cruciate ligament, posterior cruciate ligament, deep and superficial medial collateral ligament, patellofemoral ligament  IMPLANTS UTILIZED: DePuy PFC Sigma size 3 posterior stabilized femoral component (cemented), size 4 MBT tibial component (cemented), 35 mm 3 peg oval dome patella (cemented), and a 10 mm stabilized rotating platform polyethylene insert.  INDICATIONS FOR SURGERY: Samantha Mason is a 68 y.o. year old female with a long history of progressive knee pain. X-rays demonstrated severe degenerative changes in tricompartmental fashion. The patient had not seen any significant improvement despite conservative nonsurgical intervention. After discussion of the risks and benefits of surgical intervention, the patient expressed understanding of the risks benefits and agree with plans for total knee arthroplasty.   The risks, benefits, and alternatives were discussed at length including but not limited to the risks of infection, bleeding, nerve injury, stiffness, blood clots, the need for revision surgery, cardiopulmonary complications, among others, and they were willing to proceed.  PROCEDURE IN DETAIL: The patient was brought into the operating room and, after adequate spinal anesthesia was achieved, a tourniquet  was placed on the patient's upper thigh. The patient's knee and leg were cleaned and prepped with alcohol and DuraPrep and draped in the usual sterile fashion. A "timeout" was performed as per usual protocol. The lower extremity was exsanguinated using an Esmarch, and the tourniquet was inflated to 300 mmHg. An anterior longitudinal incision was made followed by a standard mid vastus approach. The deep fibers of the medial collateral ligament were elevated in a subperiosteal fashion off of the medial flare of the tibia so as to maintain a continuous soft tissue sleeve. The patella was subluxed laterally and the patellofemoral ligament was incised. Inspection of the knee demonstrated severe degenerative changes with full-thickness loss of articular cartilage. Osteophytes were debrided using a rongeur. Anterior and posterior cruciate ligaments were excised. Two 4.0 mm Schanz pins were inserted in the femur and into the tibia for attachment of the array of trackers used for computer-assisted navigation. Hip center was identified using a circumduction technique. Distal landmarks were mapped using the computer. The distal femur and proximal tibia were mapped using the computer. The distal femoral cutting guide was positioned using computer-assisted navigation so as to achieve a 5 distal valgus cut. The femur was sized and it was felt that a size 3 femoral component was appropriate. A size 3 femoral cutting guide was positioned and the anterior cut was performed and verified using the computer. This was followed by completion of the posterior and chamfer cuts. Femoral cutting guide for the central box was then positioned in the center box cut was performed.  Attention was then directed to the proximal tibia. Medial and lateral menisci were excised. The extramedullary tibial cutting guide was positioned using computer-assisted navigation so as to achieve a 0 varus-valgus alignment and 0 posterior slope. The cut was  performed and verified using the computer.  The proximal tibia was sized and it was felt that a size 4 tibial tray was appropriate. Tibial and femoral trials were inserted followed by insertion of a 10 mm polyethylene insert. The knee was felt to be tight medially. A Cobb elevator was used to elevate the superficial fibers of the medial collateral ligament.  This allowed for excellent mediolateral soft tissue balancing both in flexion and in full extension. Finally, the patella was cut and prepared so as to accommodate a 35 mm 3 peg oval dome patella. A patella trial was placed and the knee was placed through a range of motion with excellent patellar tracking appreciated. The femoral trial was removed after debridement of posterior osteophytes. The central post-hole for the tibial component was reamed followed by insertion of a keel punch. Tibial trials were then removed. Cut surfaces of bone were irrigated with copious amounts of normal saline with antibiotic solution using pulsatile lavage and then suctioned dry. Polymethylmethacrylate cement with gentamicin was prepared in the usual fashion using a vacuum mixer. Cement was applied to the cut surface of the proximal tibia as well as along the undersurface of a size 4 MBT tibial component. Tibial component was positioned and impacted into place. Excess cement was removed using [employer] fast streamer. Cement was then applied to the cut surfaces of the femur as well as along the posterior flanges of the size 3 femoral component. The femoral component was positioned and impacted into place. Excess cement was removed using [employer] fast streamer. A 10 mm polyethylene trial was inserted and the knee was brought into full extension with steady axial compression applied. Finally, cement was applied to the backside of a 35 mm 3 peg oval dome patella and the patellar component was positioned and patellar clamp applied. Excess cement was removed using [employer] fast streamer. After adequate curing  of the cement, the tourniquet was deflated after a total tourniquet time of 128 minutes. Hemostasis was achieved using electrocautery. The knee was irrigated with copious amounts of normal saline with antibiotic solution using pulsatile lavage and then suctioned dry. 20 mL of 1.3% Exparel and 60 mL of 0.25% Marcaine in 40 mL of normal saline was injected along the posterior capsule, medial and lateral gutters, and along the arthrotomy site. A 10 mm stabilized rotating platform polyethylene insert was inserted and the knee was placed through a range of motion with excellent mediolateral soft tissue balancing appreciated and excellent patellar tracking noted. 2 medium drains were placed in the wound bed and brought out through separate stab incisions. The medial parapatellar portion of the incision was reapproximated using interrupted sutures of #1 Vicryl. Subcutaneous tissue was approximated in layers using first #0 Vicryl followed #2-0 Vicryl. The skin was approximated with skin staples. A sterile dressing was applied.  The patient tolerated the procedure well and was transported to the recovery room in stable condition.    Wojciech Willetts P. Holley Bouche., M.D.

## 2019-04-30 NOTE — Progress Notes (Addendum)
Patient laying in bed asleep.  Eyes closed. NAD.

## 2019-04-30 NOTE — H&P (Signed)
Progress Notes  Table of Contents for Progress Notes  Samantha Mason, [place] - 04/20/2019 2:15 PM EST  Rush Landmark, CMA - 04/20/2019 2:15 PM EST    Samantha Fudge, PA - 04/20/2019 2:15 PM EST Formatting of this note might be different from the original. [facility] Chief Complaint:   Chief Complaint  Patient presents with  . Knee Pain  H & P RIGHT KNEE   History of Present Illness:   Samantha Mason is a 68 y.o. female that presents to clinic today for her preoperative history and evaluation. Patient presents unaccompanied. The patient is scheduled to undergo a right total knee arthroplasty on 04/28/19 by Dr. Marry Guan. Her pain began man years ago. The pain is located primarily along the medial aspect of the knee. She reports associated swelling and some giving way of the knee. She denies associated numbness or tingling, or locking of the knee.  The patient's symptoms have progressed to the point that they decrease her quality of life. The patient has previously undergone conservative treatment including NSAIDS and injections to the knee without adequate control of her symptoms.  Of note, patient is also status post left total knee arthroplasty by Dr. Marry Guan on 05/11/12 and states the left knee is doing well.   Patient denies any significant cardiac history or history of blood clots.   Past Medical, Surgical, Family, Social History, Allergies, Medications:   Past Medical History:  Past Medical History:  Diagnosis Date  . Acid reflux 10/16/2016  . Asthma, unspecified asthma severity, unspecified whether complicated, unspecified whether persistent  . B12 deficiency anemia 07/07/2018  . Hypertension  . IBS (irritable bowel syndrome) 03/12/2017  With diarrhea, worse after cholecystectomy  . 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 Prediabetes 03/14/2017  . Restless leg syndrome 03/12/2017  . Vitamin D deficiency 03/17/2017   Past Surgical History:  Past Surgical History:  Procedure Laterality Date  . CHOLECYSTECTOMY  1982  . EXCISION SALIVARY GLAND 1980s  . Left knee arthroscopy 08/10/2010  Dr. Marry Guan  . Left knee arthroscopy 12/17/2010  . Left total knee arthroplasty 05/11/2012  Dr Marry Guan   Current Medications:  Current Outpatient Medications  Medication Sig Dispense Refill  . cetirizine (ZYRTEC) 10 MG tablet Take 10 mg by mouth once daily  . acetaminophen (TYLENOL) 500 MG tablet Take 1,000 mg by mouth 3 (three) times daily  . cholestyramine (QUESTRAN) 4 gram oral powder Take 5.7 g by mouth nightly  . cyanocobalamin (VITAMIN B12) 1000 MCG tablet Take 1,000 mcg by mouth once daily  . hydroCHLOROthiazide (HYDRODIURIL) 25 MG tablet Take 25 mg by mouth once daily  . lisinopriL (ZESTRIL) 10 MG tablet Take 10 mg by mouth once daily  . modafiniL (PROVIGIL) 200 MG tablet 200 mg once daily  . pantoprazole (PROTONIX) 40 MG DR tablet Take 40 mg by mouth once daily  . rOPINIRole (REQUIP) 1 MG tablet 1 mg nightly  . SYMBICORT 160-4.5 mcg/actuation inhaler Inhale 2 inhalations into the lungs 2 (two) times daily  . VITAMIN D2 1,250 mcg (50,000 unit) capsule 50,000 Units once a week   No current facility-administered medications for this visit.   Allergies:  Allergies  Allergen Reactions  . Armodafinil Hives  . Penicillins Diarrhea and Nausea And Vomiting  Did it involve swelling of  the face/tongue/throat, SOB, or low BP? no Did it involve sudden or severe rash/hives, skin peeling, or any reaction on the inside of your mouth or nose? No Did you need to seek medical attention at a hospital or doctor's office? No When did it last happen?in her 37s If all above answers are "NO", may proceed with cephalosporin use.   Social History:  Social History   Socioeconomic History   . Marital status: Widowed  Spouse name: Not on file  . Number of children: 2  . Years of education: 35  . Highest education level: Not on file  Occupational History  . Occupation: [employer]  . Financial resource strain: Not on file  . Food insecurity  Worry: Not on file  Inability: Not on file  . Transportation needs  Medical: Not on file  Non-medical: Not on file  Tobacco Use  . Smoking status: Never Smoker  . Smokeless tobacco: Never Used  Substance and Sexual Activity  . Alcohol use: Never  Frequency: Never  . Drug use: Never  . Sexual activity: Defer  Partners: Male  Lifestyle  . Physical activity  Days per week: Not on file  Minutes per session: Not on file  . Stress: Not on file  Relationships  . Social Medical illustrator on phone: Not on file  Gets together: Not on file  Attends religious service: Not on file  Active member of club or organization: Not on file  Attends meetings of clubs or organizations: Not on file  Relationship status: Not on file  Other Topics Concern  . Not on file  Social History Narrative  . Not on file   Family History:  Family History  Problem Relation Age of Onset  . Chronic renal disease Mother  . COPD Father  . Heart disease Brother   Review of Systems:   A 10+ ROS was performed, reviewed, and the pertinent orthopaedic findings are documented in the HPI.   Physical Examination:   BP 140/80  Ht 162.6 cm (5\' 4" )  Wt (!) 135.5 kg (298 lb 12.8 oz)  BMI 51.29 kg/m   Patient is a well-developed, well-nourished female in no acute distress. Patient has normal mood and affect. Patient is alert and oriented to person, place, and time.   HEENT: Atraumatic, normocephalic. Pupils equal and reactive to light. Extraocular motion intact. Noninjected sclera.  Cardiovascular: Regular rate and rhythm, with no murmurs, rubs, or gallops. Distal pulses auscultated with doppler.  Respiratory: Lungs clear to  auscultation bilaterally.   Right Knee: Soft tissue swelling: mild Effusion: minimal Erythema: none Crepitance: mild Tenderness: medial Alignment: relative varus Mediolateral laxity: medial pseudolaxity Posterior sag: negative Patellar tracking: Good tracking without evidence of subluxation or tilt Atrophy: No significant atrophy.  Quadriceps tone was fair to good. Range of motion: 0/0/110 degrees  Sensation intact over the saphenous, lateral sural cutaneous, superficial fibular, and deep fibular nerve distributions.  Tests Performed/Reviewed:  X-rays  No new radiographs were obtained today. Previous radiographs were reviewed of the right knee and revealed complete loss of medial joint space with bone-on-bone contact and subchondral sclerosis. Significant osteophyte formation noted in the medial compartment. Lateral view reveals significant osteophyte formation posteriorly. No fractures noted.  Impression:   ICD-10-CM  1. Primary osteoarthritis of right knee M17.11  2. BMI 50.0-59.9, adult (CMS-HCC) Active [phone]   Plan:   The patient has end-stage degenerative changes of the right knee. It was explained to the patient that the condition  is progressive in nature. Having failed conservative treatment, the patient has elected to proceed with a total joint arthroplasty. The patient will undergo a total joint arthroplasty with Dr. Marry Guan. The risks of surgery, including blood clot and infection, were discussed with the patient. Measures to reduce these risks, including the use of anticoagulation, perioperative antibiotics, and early ambulation were discussed. The importance of postoperative physical therapy was discussed with the patient. The patient elects to proceed with surgery. The patient is instructed to stop all blood thinners prior to surgery. The patient is instructed to call the hospital the day before surgery to learn of the proper arrival time. Patient is instructed to bring  her CPAP machine with her to the hospital.  Contact our office with any questions or concerns. Follow up as indicated, or sooner should any new problems arise, if conditions worsen, or if they are otherwise concerned.   Samantha Fudge, PA [facility] [place], [place] [address] Phone: [phone]  This note was generated in part with voice recognition software and I apologize for any typographical errors that were not detected and corrected.   Electronically signed by Samantha Fudge, PA at 04/23/2019 11:27 AM EST

## 2019-05-01 LAB — GLUCOSE, CAPILLARY: Glucose-Capillary: 151 mg/dL — ABNORMAL HIGH (ref 70–99)

## 2019-05-01 NOTE — Evaluation (Signed)
Occupational Therapy Evaluation Patient Details Name: Samantha Mason MRN: [id number] DOB: 1951-01-07 Today's Date: 05/01/2019    History of Present Illness Pt is a 68 yo female diagnosed with degenerative arthrosis of the right knee and is s/p elective R TKA.  PMH includes: HTN, COPD, asthma, anxiety, depression, and GERD.   Clinical Impression   Pt is very pleasant and motivated 68 yrs old female that was working up to Oct 19 in [place] and driving the day prior to surgery - pt has a lot of DME like shower transfer bench for tub shower combo- BSC  And RW to use at home - and son lives with her. She was Supervision in mobility with good safety - pt can benefit from one more session of OT for AE and DME use and functional mobility safety in IADL's     Follow Up Recommendations  Supervision - Intermittent    Equipment Recommendations       Recommendations for Other Services       Precautions / Restrictions Precautions Precautions: Fall;Knee Precaution Comments: Pt able to perform Ind RLE SLR's without extensor lag, no KI required Restrictions Weight Bearing Restrictions: Yes RLE Weight Bearing: Weight bearing as tolerated      Mobility Bed Mobility Overal bed mobility: Modified Independent             General bed mobility comments: Extra time and effort during sup to/from sit and use of the bed rail  Transfers Overall transfer level: Needs assistance Equipment used: Rolling walker (2 wheeled) Transfers: Sit to/from Stand Sit to Stand: Min guard;From elevated surface         General transfer comment: CG and supervision for sit<> stand , and functional mobilyt to bathroom - no LOB and good safety    Balance Overall balance assessment: Needs assistance   Sitting balance-Leahy Scale: Normal     Standing balance support: Bilateral upper extremity supported;During functional activity Standing balance-Leahy Scale: Good Standing balance comment: Can stand  without holding on to walker to wash hands at sink                           ADL either performed or assessed with clinical judgement   ADL                                         General ADL Comments: Min A using sock aid for socks donn and reacher for doffing , max for shoes and tedhose, supervision for pants ; S for bathroom hygiene and standing at sink for grooming     Vision Baseline Vision/History: Wears glasses Patient Visual Report: No change from baseline       Perception     Praxis      Pertinent Vitals/Pain Pain Assessment: No/denies pain Pain Score: 3  Pain Location: R knee; 0/10 at rest, 3/10 during ambulation Pain Descriptors / Indicators: Aching;Sore Pain Intervention(s): Premedicated before session;Monitored during session     Hand Dominance Left   Extremity/Trunk Assessment Upper Extremity Assessment Upper Extremity Assessment: Overall WFL for tasks assessed           Communication Communication Communication: No difficulties   Cognition Arousal/Alertness: Awake/alert Behavior During Therapy: WFL for tasks assessed/performed Overall Cognitive Status: Within Functional Limits for tasks assessed  General Comments       Exercises Total Joint Exercises Ankle Circles/Pumps: Strengthening;AROM;Both;10 reps Quad Sets: AROM;Strengthening;Right;10 reps;15 reps Gluteal Sets: Strengthening;Both;10 reps Heel Slides: AROM;Right;10 reps Hip ABduction/ADduction: AROM;Both;10 reps Straight Leg Raises: AROM;Both;10 reps Long Arc Quad: AROM;Strengthening;Right;10 reps;15 reps Knee Flexion: AROM;Strengthening;Right;10 reps;15 reps Goniometric ROM: R knee AROM: 0-76 deg Marching in Standing: AROM;Both;Standing;10 reps Other Exercises Other Exercises: Pt ed on safety in bathroom - not to pick up walker , narrow quaters - side stepping , and turn to face sink to was hands,  ed on use  of sock aid for socks donning and using reacher for doffing socks and shoes - do have reacher at home Other Exercises: HEP education and review per handout   Shoulder Instructions      Home Living Family/patient expects to be discharged to:: Private residence Living Arrangements: Children;Other (Comment)(sone lives with her) Available Help at Discharge: Family;Available 24 hours/day Type of Home: House Home Access: Stairs to enter [employer] of Steps: 3 in the back per pt   Home Layout: One level     Bathroom Shower/Tub: Tub/shower unit(but she had shower bench)         Home Equipment: Bedside commode;Walker - 2 wheels;Walker - 4 wheels;Cane - single point          Prior Functioning/Environment Level of Independence: Independent        Comments: Pt Ind with amb without an AD HH distances and with a SPC limited community distances, no fall history, Ind with ADLs and 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 [facility]        OT Problem List: Decreased strength;Decreased knowledge of use of DME or AE      OT Treatment/Interventions: Self-care/ADL training;Patient/family education;Therapeutic activities;DME and/or AE instruction    OT Goals(Current goals can be found in the care plan section) Acute Rehab OT Goals Patient Stated Goal: Go home - son at home to help as needed OT Goal Formulation: With patient Time For Goal Achievement: 05/08/19 Potential to Achieve Goals: Good  OT Frequency: Min 1X/week   Barriers to D/C:            Co-evaluation              AM-PAC OT "6 Clicks" Daily Activity     Outcome Measure Help from another person eating meals?: None Help from another person taking care of personal grooming?: None Help from another person toileting, which includes using toliet, bedpan, or urinal?: A Little Help from another person bathing (including washing, rinsing, drying)?: A Little Help from another person to put on and taking off regular upper  body clothing?: None Help from another person to put on and taking off regular lower body clothing?: A Lot 6 Click Score: 20   End of Session Equipment Utilized During Treatment: Gait belt;Rolling walker  Activity Tolerance: Patient tolerated treatment well Patient left: in chair;with call bell/phone within reach;with chair alarm set  OT Visit Diagnosis: Other abnormalities of gait and mobility (R26.89)                Time: 6433-2951 OT Time Calculation (min): 24 min Charges:  OT General Charges $OT Visit: 1 Visit OT Evaluation $OT Eval Low Complexity: 1 Low OT Treatments $Self Care/Home Management : 8-22 mins  1. Pt to be Supervision in functional mobility in simulate IADLs and to bathroom with good safety and no LOB  1 week 2. Pt to be min A in donning and doffing of shoes  1 week   Rosalyn Gess OTR/L,CLT 05/01/2019, 12:59 PM

## 2019-05-01 NOTE — Progress Notes (Signed)
Physical Therapy Treatment Patient Details Name: Samantha Mason MRN: [id number] DOB: 1950-12-09 Today's Date: 05/01/2019    History of Present Illness Pt is a 68 yo female diagnosed with degenerative arthrosis of the right knee and is s/p elective R TKA.  PMH includes: HTN, COPD, asthma, anxiety, depression, and GERD.    PT Comments    Pt presented with deficits in strength, transfers, mobility, gait, balance, R knee ROM, and activity tolerance but made good progress towards goals.  Pt found on 2LO2/min with SpO2 99-100%.  Per nursing ok for trial wean on room air.  Pt remained Mod Ind with bed mobility tasks with grossly improving speed and control of the RLE out of bed.  Pt required some effort to stand from the EOB but was steady without the need of physical assistance.  Pt ambulated 1 x 30' and 1 x 100' with a RW and CGA with gait pattern slowly progressing from step-to to step-through pattern with improving cadence.  Max R knee pain reported during ambulation was 3/10 with no pain at rest.  Pt's SpO2 ranged from 95% during amb to 98% at rest on room air with pt left on room air per nursing request.  Pt will require stair training prior to discharge. Pt will benefit from HHPT services upon discharge to safely address above deficits for decreased caregiver assistance and eventual return to PLOF.     Follow Up Recommendations  Home health PT;Supervision for mobility/OOB     Equipment Recommendations  Other (comment)(Pt may benefit from Bariatric RW)    Recommendations for Other Services       Precautions / Restrictions Precautions Precautions: Fall Precaution Comments: Pt able to perform Ind RLE SLR's without extensor lag, no KI required Restrictions Weight Bearing Restrictions: Yes RLE Weight Bearing: Weight bearing as tolerated    Mobility  Bed Mobility Overal bed mobility: Modified Independent             General bed mobility comments: Extra time and effort during  sup to/from sit and use of the bed rail  Transfers Overall transfer level: Needs assistance Equipment used: Rolling walker (2 wheeled) Transfers: Sit to/from Stand Sit to Stand: Min guard;From elevated surface         General transfer comment: Mod verbal cues for R foot placement during sit to/from stand with pt demonstrating fair concentric and eccentric control  Ambulation/Gait Ambulation/Gait assistance: Min guard Gait Distance (Feet): 100 Feet x 1, 30 Feet x 1 Assistive device: Rolling walker (2 wheeled) Gait Pattern/deviations: Step-to pattern;Decreased step length - left;Decreased stance time - right;Antalgic;Step-through pattern Gait velocity: decreased   General Gait Details: Mildly antalgic gait pattern on the RLE but steady without LOB or buckling; gait pattern gradually progressed from step-to to step-through   Stairs             Wheelchair Mobility    Modified Rankin (Stroke Patients Only)       Balance Overall balance assessment: Needs assistance   Sitting balance-Leahy Scale: Normal     Standing balance support: Bilateral upper extremity supported;During functional activity Standing balance-Leahy Scale: Good Standing balance comment: Min lean on the RW for support but no LOB noted                            Cognition Arousal/Alertness: Awake/alert Behavior During Therapy: WFL for tasks assessed/performed Overall Cognitive Status: Within Functional Limits for tasks assessed  Exercises Total Joint Exercises Ankle Circles/Pumps: Strengthening;AROM;Both;10 reps Quad Sets: AROM;Strengthening;Right;10 reps;15 reps Gluteal Sets: Strengthening;Both;10 reps Heel Slides: AROM;Right;10 reps Hip ABduction/ADduction: AROM;Both;10 reps Straight Leg Raises: AROM;Both;10 reps Long Arc Quad: AROM;Strengthening;Right;10 reps;15 reps Knee Flexion: AROM;Strengthening;Right;10 reps;15  reps Goniometric ROM: R knee AROM: 0-76 deg Marching in Standing: AROM;Both;Standing;10 reps Other Exercises Other Exercises: 90 deg R turn training x 4 to prevent CKC twisting on R knee Other Exercises: HEP education and review per handout    General Comments        Pertinent Vitals/Pain Pain Assessment: 0-10 Pain Score: 3  Pain Location: R knee; 0/10 at rest, 3/10 during ambulation Pain Descriptors / Indicators: Aching;Sore Pain Intervention(s): Premedicated before session;Monitored during session    Home Living                      Prior Function            PT Goals (current goals can now be found in the care plan section) Progress towards PT goals: Progressing toward goals    Frequency    BID      PT Plan Current plan remains appropriate    Co-evaluation              AM-PAC PT "6 Clicks" Mobility   Outcome Measure  Help needed turning from your back to your side while in a flat bed without using bedrails?: A Little Help needed moving from lying on your back to sitting on the side of a flat bed without using bedrails?: A Little Help needed moving to and from a bed to a chair (including a wheelchair)?: A Little Help needed standing up from a chair using your arms (e.g., wheelchair or bedside chair)?: A Little Help needed to walk in hospital room?: A Little Help needed climbing 3-5 steps with a railing? : A Little 6 Click Score: 18    End of Session Equipment Utilized During Treatment: Gait belt Activity Tolerance: Patient tolerated treatment well Patient left: in chair;with call bell/phone within reach;with chair alarm set;with SCD's reapplied;Other (comment)(Polar care donned to R knee) Nurse Communication: Mobility status;Other (comment)(Results of trail wean on room air, per nursing ok to leave pt on room air at end of session) PT Visit Diagnosis: Other abnormalities of gait and mobility (R26.89);Muscle weakness (generalized)  (M62.81);Pain Pain - Right/Left: Right Pain - part of body: Knee     Time: 9163-8466 PT Time Calculation (min) (ACUTE ONLY): 42 min  Charges:  $Gait Training: 8-22 mins $Therapeutic Exercise: 8-22 mins $Therapeutic Activity: 8-22 mins                     D. Scott Gamble Enderle PT, DPT 05/01/19, 11:43 AM

## 2019-05-01 NOTE — Progress Notes (Signed)
After speaking with on call MD He thought that perhaps the neuroyin prescribed may have be the cause of her drowsiness. Insrtucted to continue to watch patient and to re page if any other symptoms emerged.

## 2019-05-01 NOTE — Progress Notes (Signed)
   Subjective: 1 Day Post-Op Procedure(s) (LRB): COMPUTER ASSISTED TOTAL KNEE ARTHROPLASTY (Right) Patient reports pain as 0 on 0-10 scale.   Patient is well, and has had no acute complaints or problems.  Patient drowsy, lethargic last night, this has resolved.  Patient states she feels normal and at baseline. Denies any CP, SOB, ABD pain. We will start physical therapy today.  Plan is to go Home after hospital stay.  Objective: Vital signs in last 24 hours: Temp:  [97 F (36.1 C)-98.2 F (36.8 C)] 97.6 F (36.4 C) (12/12 0821) Pulse Rate:  [64-78] 64 (12/12 0821) Resp:  [16-18] 17 (12/12 0821) BP: (107-155)/(49-121) 110/58 (12/12 0821) SpO2:  [93 %-100 %] 97 % (12/12 0821) Weight:  [134.7 kg] 134.7 kg (12/11 1700)  Intake/Output from previous day: 12/11 0701 - 12/12 0700 In: 3938.3 [I.V.:3503.2; IV Piggyback:335.1] Out: 900 [Urine:850; Blood:50] Intake/Output this shift: Total I/O In: 210.5 [I.V.:95.2; IV Piggyback:115.2] Out: -   No results for input(s): HGB in the last 72 hours. No results for input(s): WBC, RBC, HCT, PLT in the last 72 hours. No results for input(s): NA, K, CL, CO2, BUN, CREATININE, GLUCOSE, CALCIUM in the last 72 hours. No results for input(s): LABPT, INR in the last 72 hours.  EXAM General - Patient is Alert, Appropriate and Oriented.  No signs of confusion Extremity - Neurovascular intact Sensation intact distally Intact pulses distally Dorsiflexion/Plantar flexion intact No cellulitis present Compartment soft Dressing - dressing C/D/I and no drainage, Hemovac intact Motor Function - intact, moving foot and toes well on exam.   Past Medical History:  Diagnosis Date  . Acute bronchitis   . Anemia   . Anxiety and depression   . Arthritis   . Asthma   . B12 deficiency 03/14/2017  . Chest pain 07/02/2018  . COPD (chronic obstructive pulmonary disease) ([facility]) 07/24/2018  . Family history of blood clots 07/16/2017  . Irritable bowel syndrome   .  Malignant hypertension   . Obesity   . OSA (obstructive sleep apnea)    uses cpap  . Pneumonia 2017  . Restless leg syndrome   . Stroke ([place]) 1995   mild    Assessment/Plan:   1 Day Post-Op Procedure(s) (LRB): COMPUTER ASSISTED TOTAL KNEE ARTHROPLASTY (Right) Active Problems:   Total knee replacement status  Estimated body mass index is 50.98 kg/m as calculated from the following:   Height as of this encounter: 5\' 4"  (1.626 m).   Weight as of this encounter: 134.7 kg. Advance diet Up with therapy, weightbearing as tolerated Work on bowel movement Vital signs are stable Pain well controlled Care management to assist with discharge   DVT Prophylaxis - Lovenox, Foot Pumps and TED hose Weight-Bearing as tolerated to right leg   T. Rachelle Hora, PA-C [facility] 05/01/2019, 8:58 AM

## 2019-05-01 NOTE — Progress Notes (Signed)
Physical Therapy Treatment Patient Details Name: Samantha Mason MRN: [id number] DOB: Sep 30, 1950 Today's Date: 05/01/2019    History of Present Illness Pt is a 68 yo female diagnosed with degenerative arthrosis of the right knee and is s/p elective R TKA.  PMH includes: HTN, COPD, asthma, anxiety, depression, and GERD. OF note, rapid response called this date, pt stable at this time, no new orders    PT Comments    Pt is making great progress towards goals with ability to navigate RN station demonstrate good weight acceptance on surgical limb as well as improvement in gait sequencing. Stair training performed and pt feels safe to dc home. Good endurance with there-ex. Limited pain with exertion. All mobility performed safely on RA. At this time, would recommend dc home as pt has reached all acute care goals.   Follow Up Recommendations  Home health PT     Equipment Recommendations  (BRW)    Recommendations for Other Services       Precautions / Restrictions Precautions Precautions: Fall;Knee Precaution Booklet Issued: Yes (comment) Precaution Comments: Pt able to perform Ind RLE SLR's without extensor lag, no KI required Restrictions Weight Bearing Restrictions: Yes RLE Weight Bearing: Weight bearing as tolerated    Mobility  Bed Mobility Overal bed mobility: Modified Independent             General bed mobility comments: not performed as pt received in recliner  Transfers Overall transfer level: Needs assistance Equipment used: Rolling walker (2 wheeled) Transfers: Sit to/from Stand Sit to Stand: Supervision         General transfer comment: safe technique with 1 cue for correct hand placement. upright posture noted  Ambulation/Gait Ambulation/Gait assistance: Supervision Gait Distance (Feet): 250 Feet Assistive device: Rolling walker (2 wheeled) Gait Pattern/deviations: Step-through pattern Gait velocity: decreased   General Gait Details: ambulated  with symmetrical reciprocal gait pattern. Able to demonstrate upright posture. Slow speed and minimal pain reported   Stairs Stairs: Yes Stairs assistance: Min guard Stair Management: One rail Left;Forwards;Step to pattern Number of Stairs: 8 General stair comments: 2 x 4 stairs with cga. First rep with B railing and 2nd attempt with L rail to simulate home environment. Step to gait pattern performed with safe technique. O2 sats at 100% on RA and HR at 107 bpm with exertion.    Wheelchair Mobility    Modified Rankin (Stroke Patients Only)       Balance Overall balance assessment: Needs assistance Sitting-balance support: Feet supported;No upper extremity supported Sitting balance-Leahy Scale: Normal     Standing balance support: Bilateral upper extremity supported;During functional activity Standing balance-Leahy Scale: Good Standing balance comment: able to maintain balance without hands on assist                            Cognition Arousal/Alertness: Awake/alert Behavior During Therapy: WFL for tasks assessed/performed Overall Cognitive Status: Within Functional Limits for tasks assessed                                        Exercises Total Joint Exercises Ankle Circles/Pumps: Strengthening;AROM;Both;10 reps Quad Sets: AROM;Strengthening;Right;10 reps;15 reps Gluteal Sets: Strengthening;Both;10 reps Heel Slides: AROM;Right;10 reps Hip ABduction/ADduction: AROM;Both;10 reps Straight Leg Raises: AROM;Both;10 reps Long Arc Quad: AROM;Strengthening;Right;10 reps;15 reps Knee Flexion: AROM;Strengthening;Right;10 reps;15 reps Goniometric ROM: R knee AROM: 0-76 deg Marching in Standing:  AROM;Both;Standing;10 reps Other Exercises Other Exercises: Pt ed on safety in bathroom - not to pick up walker , narrow quaters - side stepping , and turn to face sink to was hands,  ed on use of sock aid for socks donning and using reacher for doffing socks  and shoes - do have reacher at home Other Exercises: HEP education and review per handout Other Exercises: seated ther-ex performed on R LE including AP, quad sets, SLRs, hip abd/add, and SAQ. All ther-ex performed x 12 reps with cga. Other Exercises: Ambulated to bathroom with cga. Removed BSC as she prefers to sit on low toilet. CGA for transfers and able to maintain balance while standing at sink for hand washing.    General Comments        Pertinent Vitals/Pain Pain Assessment: 0-10 Pain Score: 2  Pain Location: R knee Pain Descriptors / Indicators: Tightness Pain Intervention(s): Limited activity within patient's tolerance;Repositioned;Ice applied    Home Living Family/patient expects to be discharged to:: Private residence Living Arrangements: Children;Other (Comment)(sone lives with her) Available Help at Discharge: Family;Available 24 hours/day Type of Home: House Home Access: Stairs to enter   Home Layout: One level Home Equipment: Bedside commode;Walker - 2 wheels;Walker - 4 wheels;Cane - single point      Prior Function Level of Independence: Independent      Comments: Pt Ind with amb without an AD HH distances and with a SPC limited community distances, no fall history, Ind with ADLs and 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 [facility]   PT Goals (current goals can now be found in the care plan section) Acute Rehab PT Goals Patient Stated Goal: Go home - son at home to help as needed PT Goal Formulation: With patient Time For Goal Achievement: 05/13/19 Potential to Achieve Goals: Good Progress towards PT goals: Progressing toward goals    Frequency    BID      PT Plan Current plan remains appropriate    Co-evaluation              AM-PAC PT "6 Clicks" Mobility   Outcome Measure  Help needed turning from your back to your side while in a flat bed without using bedrails?: A Little Help needed moving from lying on your back to sitting on the side of a  flat bed without using bedrails?: A Little Help needed moving to and from a bed to a chair (including a wheelchair)?: A Little Help needed standing up from a chair using your arms (e.g., wheelchair or bedside chair)?: A Little Help needed to walk in hospital room?: A Little Help needed climbing 3-5 steps with a railing? : A Little 6 Click Score: 18    End of Session Equipment Utilized During Treatment: Gait belt Activity Tolerance: Patient tolerated treatment well Patient left: in chair;with call bell/phone within reach;with chair alarm set;with SCD's reapplied;Other (comment) Nurse Communication: Mobility status;Other (comment) PT Visit Diagnosis: Other abnormalities of gait and mobility (R26.89);Muscle weakness (generalized) (M62.81);Pain Pain - Right/Left: Right Pain - part of body: Knee     Time: 3875-6433 PT Time Calculation (min) (ACUTE ONLY): 40 min  Charges:  $Gait Training: 8-22 mins $Therapeutic Exercise: 8-22 mins $Therapeutic Activity: 8-22 mins                     Greggory Stallion, PT, DPT [phone]    Samantha Mason 05/01/2019, 2:27 PM

## 2019-05-01 NOTE — Progress Notes (Signed)
Patient found to be very drowsy lethargic and unable to stay awake. BP taken; Initially high but when rechecked it was found to be in a normal range 108/53. BP was also taken manually to ensure accuracy. BP was 118/52. Patient pupils were pinpoint but grips were equal bilaterally. MD on call paged.

## 2019-05-01 NOTE — Progress Notes (Signed)
Patient started coughing and wheezing after drinking some tea. Her o2 sats dropped down into the 70's. Rapid response called O2 initiated. Patients o2 saturation immediately came back up to 99%. Patient states she feels better no additional difficulty breathing, lungs clear. Patient also asked about her recent drowsiness. Patient states this has also improved. Patient called her daughter to notify her she was feeling much better.

## 2019-05-02 MED ORDER — TRAMADOL HCL 50 MG PO TABS: 50.0000 mg | ORAL_TABLET | ORAL | 0 refills | Status: DC | PRN

## 2019-05-02 MED ORDER — ENOXAPARIN SODIUM 40 MG/0.4ML ~~LOC~~ SOLN: 40.0000 mg | SUBCUTANEOUS | 0 refills | Status: DC

## 2019-05-02 MED ORDER — OXYCODONE HCL 5 MG PO TABS: 5.0000 mg | ORAL_TABLET | ORAL | 0 refills | Status: DC | PRN

## 2019-05-02 NOTE — Plan of Care (Signed)
  Problem: Education: Goal: Knowledge of General Education information will improve Description: Including pain rating scale, medication(s)/side effects and non-pharmacologic comfort measures Outcome: Adequate for Discharge   Problem: Health Behavior/Discharge Planning: Goal: Ability to manage health-related needs will improve Outcome: Adequate for Discharge   Problem: Clinical Measurements: Goal: Ability to maintain clinical measurements within normal limits will improve Outcome: Adequate for Discharge Goal: Will remain free from infection Outcome: Adequate for Discharge Goal: Diagnostic test results will improve Outcome: Adequate for Discharge Goal: Respiratory complications will improve Outcome: Adequate for Discharge Goal: Cardiovascular complication will be avoided Outcome: Adequate for Discharge   Problem: Activity: Goal: Risk for activity intolerance will decrease Outcome: Adequate for Discharge   Problem: Nutrition: Goal: Adequate nutrition will be maintained Outcome: Adequate for Discharge   Problem: Coping: Goal: Level of anxiety will decrease Outcome: Adequate for Discharge   Problem: Elimination: Goal: Will not experience complications related to bowel motility Outcome: Adequate for Discharge Goal: Will not experience complications related to urinary retention Outcome: Adequate for Discharge   Problem: Pain Managment: Goal: General experience of comfort will improve Outcome: Adequate for Discharge   Problem: Safety: Goal: Ability to remain free from injury will improve Outcome: Adequate for Discharge   Problem: Skin Integrity: Goal: Risk for impaired skin integrity will decrease Outcome: Adequate for Discharge   Problem: Education: Goal: Knowledge of the prescribed therapeutic regimen will improve Outcome: Adequate for Discharge Goal: Individualized Educational Video(s) Outcome: Adequate for Discharge   Problem: Activity: Goal: Ability to avoid  complications of mobility impairment will improve Outcome: Adequate for Discharge Goal: Range of joint motion will improve Outcome: Adequate for Discharge   Problem: Clinical Measurements: Goal: Postoperative complications will be avoided or minimized Outcome: Adequate for Discharge   Problem: Pain Management: Goal: Pain level will decrease with appropriate interventions Outcome: Adequate for Discharge   Problem: Skin Integrity: Goal: Will show signs of wound healing Outcome: Adequate for Discharge   

## 2019-05-02 NOTE — Discharge Summary (Signed)
Physician Discharge Summary  Patient ID: ZOEE HEENEY MRN: [id number] DOB/AGE: 06/20/1950 68 y.o.  Admit date: 04/30/2019 Discharge date: 05/02/2019  Admission Diagnoses:  Total knee replacement status [Z96.659]   Discharge Diagnoses: Patient Active Problem List   Diagnosis Date Noted  . Total knee replacement status 04/30/2019  . Primary osteoarthritis of right knee 02/13/2019  . S/P total knee arthroplasty, left 02/13/2019  . Aortic atherosclerosis ([place]) 08/01/2018  . B12 deficiency anemia 07/07/2018  . Benign neoplasm of ascending colon   . Positive colorectal cancer screening using Cologuard test 03/13/2018  . Severe persistent asthma 02/13/2018  . Vitamin D deficiency 03/17/2017  . Prediabetes 03/14/2017  . Microcytic anemia 03/14/2017  . IBS (irritable bowel syndrome) 03/12/2017  . Allergic rhinitis 03/12/2017  . Restless leg syndrome 03/12/2017  . Acid reflux 10/16/2016  . Essential hypertension 10/16/2016  . OSA on CPAP 12/08/2014  . BMI 50.0-59.9, adult ([place]) 05/31/2014    Past Medical History:  Diagnosis Date  . Acute bronchitis   . Anemia   . Anxiety and depression   . Arthritis   . Asthma   . B12 deficiency 03/14/2017  . Chest pain 07/02/2018  . COPD (chronic obstructive pulmonary disease) ([place]) 07/24/2018  . Family history of blood clots 07/16/2017  . Irritable bowel syndrome   . Malignant hypertension   . Obesity   . OSA (obstructive sleep apnea)    uses cpap  . Pneumonia 2017  . Restless leg syndrome   . Stroke [facility]) 1995   mild     Transfusion: none   Consultants (if any):   Discharged Condition: Improved  Hospital Course: Samantha Mason is an 68 y.o. female who was admitted 04/30/2019 with a diagnosis of right knee osteoarthritis and went to the operating room on 04/30/2019 and underwent the above named procedures.    Surgeries: Procedure(s): COMPUTER ASSISTED TOTAL KNEE ARTHROPLASTY on 04/30/2019 Patient tolerated the surgery  well. Taken to PACU where she was stabilized and then transferred to the orthopedic floor.  Started on Lovenox 30 mg q 12 hrs. Foot pumps applied bilaterally at 80 mm. Heels elevated on bed with rolled towels. No evidence of DVT. Negative [place]. Physical therapy started on day #1 for gait training and transfer. OT started day #1 for ADL and assisted devices.  Patient's foley was d/c on day #1.  On postop day 1, patient made good progress of physical therapy.  Ambulating 250 feet plus stairs.  Patient's IV and hemovac was d/c on day #2.  On post op day #2 patient was stable and ready for discharge to home with home health PT.  Implants: DePuy PFC Sigma size 3 posterior stabilized femoral component (cemented), size 4 MBT tibial component (cemented), 35 mm 3 peg oval dome patella (cemented), and a 10 mm stabilized rotating platform polyethylene insert.  She was given perioperative antibiotics:  Anti-infectives (From admission, onward)   Start     Dose/Rate Route Frequency Ordered Stop   04/30/19 1430  clindamycin (CLEOCIN) IVPB 600 mg     600 mg 100 mL/hr over 30 Minutes Intravenous Every 6 hours 04/30/19 1426 05/01/19 1437   04/30/19 0600  clindamycin (CLEOCIN) IVPB 900 mg     900 mg 100 mL/hr over 30 Minutes Intravenous On call to O.R. 04/29/19 2225 04/30/19 0747    .  She was given sequential compression devices, early ambulation, and Lovenox, teds for DVT prophylaxis.  She benefited maximally from the hospital stay and there were no complications.  Recent vital signs:  Vitals:   05/01/19 1528 05/02/19 0022  BP: (!) 120/47 (!) 154/54  Pulse: 82 96  Resp: 17 18  Temp: 98 F (36.7 C) 98.3 F (36.8 C)  SpO2: 94% 97%    Recent laboratory studies:  Lab Results  Component Value Date   HGB 11.0 (L) 04/19/2019   HGB 11.6 (L) 01/11/2019   HGB 10.6 (L) 07/24/2018   Lab Results  Component Value Date   WBC 9.7 04/19/2019   PLT 272 04/19/2019   Lab Results  Component Value  Date   INR 1.1 04/19/2019   Lab Results  Component Value Date   NA 138 04/19/2019   K 3.7 04/19/2019   CL 106 04/19/2019   CO2 21 (L) 04/19/2019   BUN 39 (H) 04/19/2019   CREATININE 1.75 (H) 04/19/2019   GLUCOSE 113 (H) 04/19/2019    Discharge Medications:   Allergies as of 05/02/2019      Reactions   Nuvigil [armodafinil] Hives   Penicillins Diarrhea, Nausea And Vomiting   Did it involve swelling of the face/tongue/throat, SOB, or low BP? no Did it involve sudden or severe rash/hives, skin peeling, or any reaction on the inside of your mouth or nose? No Did you need to seek medical attention at a hospital or doctor's office? No When did it last happen?in her 24s If all above answers are "NO", may proceed with cephalosporin use.      Medication List    TAKE these medications   acetaminophen 500 MG tablet Commonly known as: TYLENOL Take 2 tablets (1,000 mg total) by mouth every 8 (eight) hours as needed for moderate pain.   albuterol 1.25 MG/3ML nebulizer solution Commonly known as: ACCUNEB Take 1 ampule by nebulization every 6 (six) hours as needed for wheezing.   cetirizine 10 MG tablet Commonly known as: ZYRTEC Take 1 tablet (10 mg total) by mouth daily. What changed: when to take this   cholestyramine 4 GM/DOSE powder Commonly known as: QUESTRAN MIX 4GM AND DRINK BY MOUTH DAILY. *SANDOZ ONLY* What changed: See the new instructions.   diphenoxylate-atropine 2.5-0.025 MG tablet Commonly known as: Lomotil Take 1 tablet by mouth 3 (three) times daily as needed for diarrhea or loose stools.   enoxaparin 40 MG/0.4ML injection Commonly known as: Lovenox Inject 0.4 mLs (40 mg total) into the skin daily for 14 days.   hydrochlorothiazide 25 MG tablet Commonly known as: HYDRODIURIL Take 1 tablet (25 mg total) by mouth daily.   lisinopril 10 MG tablet Commonly known as: ZESTRIL Take 1 tablet (10 mg total) by mouth daily. What changed: when to take this    modafinil 200 MG tablet Commonly known as: PROVIGIL Take 1 tablet (200 mg total) by mouth daily as needed.   oxyCODONE 5 MG immediate release tablet Commonly known as: Oxy IR/ROXICODONE Take 1-2 tablets (5-10 mg total) by mouth every 4 (four) hours as needed for moderate pain (pain score 4-6).   pantoprazole 40 MG tablet Commonly known as: PROTONIX TAKE 1 TABLET BY MOUTH DAILY What changed: when to take this   rOPINIRole 1 MG tablet Commonly known as: REQUIP Take 1 tablet (1 mg total) by mouth 2 (two) times daily. What changed:   when to take this  additional instructions   Symbicort 160-4.5 MCG/ACT inhaler Generic drug: budesonide-formoterol INHALE 2 PUFFS INTO THE LUNGS 2 (TWO) TIMES DAILY. What changed: See the new instructions.   traMADol 50 MG tablet Commonly known as: ULTRAM Take 1-2 tablets (  50-100 mg total) by mouth every 4 (four) hours as needed for moderate pain.   vitamin B-12 1000 MCG tablet Commonly known as: CYANOCOBALAMIN Take 1,000 mcg by mouth daily.   Vitamin D (Ergocalciferol) 1.25 MG (50000 UT) Caps capsule Commonly known as: DRISDOL Take 1 capsule (50,000 Units total) by mouth every 7 (seven) days.            Durable Medical Equipment  (From admission, onward)         Start     Ordered   04/30/19 1426  DME Walker rolling  Once    Question:  Patient needs a walker to treat with the following condition  Answer:  Total knee replacement status   04/30/19 1426   04/30/19 1426  DME Bedside commode  Once    Question:  Patient needs a bedside commode to treat with the following condition  Answer:  Total knee replacement status   04/30/19 1426          Diagnostic Studies: DG Knee Right Port  Result Date: 04/30/2019 CLINICAL DATA:  Status post right total knee replacement. EXAM: PORTABLE RIGHT KNEE - 1-2 VIEW COMPARISON:  Sep 26, 2004. FINDINGS: The right femoral and tibial components appear to be well situated. Expected postoperative  changes are noted in the surrounding soft tissues, including surgical drain seen anterior to distal femur. IMPRESSION: Status post right total knee arthroplasty. Electronically Signed   By: Marijo Conception M.D.   On: 04/30/2019 12:37    Disposition: Discharge disposition: 01-Home or Self Care         Follow-up Information    [facility] On 05/13/2019.   Specialty: Orthopedic Surgery Why: at 9:45am Contact information: [place] [address] [phone]        Dereck Leep, MD On 06/10/2019.   Specialty: Orthopedic Surgery Why: at 1:45pm Contact information: [place] [address] [phone]            Signed: Feliberto Gottron 05/02/2019, 9:18 AM

## 2019-05-02 NOTE — Progress Notes (Signed)
Physical Therapy Treatment Patient Details Name: Samantha Mason MRN: [id number] DOB: [birth year] Today's Date: 05/02/2019    History of Present Illness Pt is a 68 yo female diagnosed with degenerative arthrosis of the right knee and is s/p elective R TKA.  PMH includes: HTN, COPD, asthma, anxiety, depression, and GERD. OF note, rapid response called this date, pt stable at this time, no new orders    PT Comments    Pt presented with minor deficits in strength, transfers, mobility, gait, balance, and activity tolerance along with R knee ROM but continued to make good progress towards goals.  Pt steady with gait with improving cadence and LLE step length with no instability noted.  Pt declined to practice stairs again this date secondary to fatigue from a poor night's sleep but reported no concerns following stair training during the previous session.  Pt showed good functional strength during transfers from various height surfaces including from standard height commode.  Pt will benefit from HHPT services upon discharge to safely address above deficits for decreased caregiver assistance and eventual return to PLOF.     Follow Up Recommendations  Home health PT     Equipment Recommendations  Other (comment)(BRW)    Recommendations for Other Services       Precautions / Restrictions Precautions Precautions: Fall;Knee Precaution Comments: Pt able to perform Ind RLE SLR's without extensor lag, no KI required Restrictions Weight Bearing Restrictions: Yes RLE Weight Bearing: Weight bearing as tolerated    Mobility  Bed Mobility Overal bed mobility: Modified Independent             General bed mobility comments: Min extra time and effort with rail not required  Transfers Overall transfer level: Needs assistance Equipment used: Rolling walker (2 wheeled) Transfers: Sit to/from Stand Sit to Stand: Supervision         General transfer comment: Good eccentric and concentric  control with transfers from various height surfaces with min verbal cues for R foot positioning  Ambulation/Gait Ambulation/Gait assistance: Supervision Gait Distance (Feet): 200 Feet Assistive device: Rolling walker (2 wheeled) Gait Pattern/deviations: Step-through pattern Gait velocity: decreased   General Gait Details: Symmetrical reciprocal pattern with good control and stability and improved cadence   Stairs         General stair comments: Pt declined stair training this session secondary to general fatigue; pt reports no concerns with stairs after stair training previous session   Wheelchair Mobility    Modified Rankin (Stroke Patients Only)       Balance Overall balance assessment: Needs assistance Sitting-balance support: Feet supported;No upper extremity supported Sitting balance-Leahy Scale: Normal     Standing balance support: Bilateral upper extremity supported;During functional activity Standing balance-Leahy Scale: Good                              Cognition Arousal/Alertness: Awake/alert Behavior During Therapy: WFL for tasks assessed/performed Overall Cognitive Status: Within Functional Limits for tasks assessed                                        Exercises Total Joint Exercises Ankle Circles/Pumps: Strengthening;AROM;Both;10 reps Quad Sets: AROM;Strengthening;Right;10 reps;15 reps Gluteal Sets: Strengthening;Both;10 reps Heel Slides: AROM;Right;10 reps Hip ABduction/ADduction: AROM;Both;10 reps Straight Leg Raises: AROM;Both;10 reps Long Arc Quad: AROM;Strengthening;Right;10 reps;15 reps Knee Flexion: AROM;Strengthening;Right;10 reps;15 reps Goniometric ROM: R knee  AROM: 0-78 deg Marching in Standing: AROM;Both;Standing;10 reps Other Exercises Other Exercises: Positioning review for R knee ext PROM Other Exercises: HEP education and review per handout with focus on R knee ROM therex Other Exercises: Car  transfer sequencing discussion and visual demonstration using room chair to simulate car    General Comments        Pertinent Vitals/Pain Pain Assessment: No/denies pain    Home Living                      Prior Function            PT Goals (current goals can now be found in the care plan section) Progress towards PT goals: Progressing toward goals    Frequency    BID      PT Plan Current plan remains appropriate    Co-evaluation              AM-PAC PT "6 Clicks" Mobility   Outcome Measure  Help needed turning from your back to your side while in a flat bed without using bedrails?: None Help needed moving from lying on your back to sitting on the side of a flat bed without using bedrails?: None Help needed moving to and from a bed to a chair (including a wheelchair)?: A Little Help needed standing up from a chair using your arms (e.g., wheelchair or bedside chair)?: A Little Help needed to walk in hospital room?: A Little Help needed climbing 3-5 steps with a railing? : A Little 6 Click Score: 20    End of Session Equipment Utilized During Treatment: Gait belt Activity Tolerance: Patient tolerated treatment well Patient left: in chair;with call bell/phone within reach;with chair alarm set;with SCD's reapplied;Other (comment)(Polar care to R knee) Nurse Communication: Mobility status PT Visit Diagnosis: Other abnormalities of gait and mobility (R26.89);Muscle weakness (generalized) (M62.81);Pain Pain - Right/Left: Right Pain - part of body: Knee     Time: 1005-1045 PT Time Calculation (min) (ACUTE ONLY): 40 min  Charges:  $Gait Training: 8-22 mins $Therapeutic Exercise: 8-22 mins $Therapeutic Activity: 8-22 mins                     D. Scott Kaya Pottenger PT, DPT 05/02/19, 11:09 AM

## 2019-05-02 NOTE — TOC Initial Note (Addendum)
Transition of Care [facility]) - Initial/Assessment Note    Patient Details  Name: Samantha Mason MRN: [id number] Date of Birth: 18-Mar-1951  Transition of Care [facility]) CM/SW Contact:    Elliot Gurney [place], [place] Phone Number: 05/02/2019, 2:47 PM  Clinical Narrative:                 Patient is a Samantha Mason is an 68 y.o. female who was admitted 04/30/2019 for a right knee replacement. Patient assessed by phone and reports residing in a home with her adult son. Per patient, she has a walker, bedside commode and shower chair at home. Her son, provides transportation to her medical appointments. Patient agrees with the recommendation for [facility] and has no preference in terms of agency used.  This Education officer, museum contacted [facility], spoke with [place] who states that she already has a chart open on patient and will follow up post discharge to provide PT services. She will send a message to the [facility] contact Drue Novel regarding patient's  discharge today. Patient informed of referral and agrees.    Algis Lehenbauer, [place] Clinical Social Worker  [phone]   Expected Discharge Plan: [place]     Patient Goals and CMS Choice Patient states their goals for this hospitalization and ongoing recovery are:: To return home and get stronger      Expected Discharge Plan and Services Expected Discharge Plan: [place] In-house Referral: Clinical Social Work   Post Acute Care Choice: [place] arrangements for the past 2 months: [place] Expected Discharge Date: 05/02/19                         HH Arranged: PT [place]: [facility] [employer] Date [place]: 05/02/19 Time [place]: [place] Representative spoke with at [place]: Darrick Grinder, [facility] Arrangements/Services Living arrangements for the past 2 months: [place] Lives with:: Adult  Children Patient language and need for interpreter reviewed:: Yes Do you feel safe going back to the place where you live?: Yes      Need for Family Participation in Patient Care: Yes (Comment) Care giver support system in place?: Yes (comment) Current home services: DME(walker, bedside commode and shower chair) Criminal Activity/Legal Involvement Pertinent to Current Situation/Hospitalization: No - Comment as needed  Activities of Daily [place] Devices/Equipment: Eyeglasses, Cane (specify quad or straight), Walker (specify type), Shower chair with back, Bedside commode/3-in-1 ADL Screening (condition at time of admission) Patient's cognitive ability adequate to safely complete daily activities?: Yes Is the patient deaf or have difficulty hearing?: No Does the patient have difficulty seeing, even when wearing glasses/contacts?: Yes Does the patient have difficulty concentrating, remembering, or making decisions?: No Patient able to express need for assistance with ADLs?: Yes Does the patient have difficulty dressing or bathing?: No Independently performs ADLs?: No Does the patient have difficulty walking or climbing stairs?: Yes Weakness of Legs: Right Weakness of Arms/Hands: None  Permission Sought/Granted                  Emotional Assessment   Attitude/Demeanor/Rapport: Engaged Affect (typically observed): Accepting Orientation: : Oriented to Self, Oriented to Place, Oriented to  Time, Oriented to Situation Alcohol / Substance Use: Not Applicable Psych Involvement: No (comment)  Admission diagnosis:  Total knee replacement status [Z96.659] Patient Active Problem List   Diagnosis  Date Noted  . Total knee replacement status 04/30/2019  . Primary osteoarthritis of right knee 02/13/2019  . S/P total knee arthroplasty, left 02/13/2019  . Aortic atherosclerosis ([place]) 08/01/2018  . B12 deficiency anemia 07/07/2018  . Benign neoplasm of ascending colon   .  Positive colorectal cancer screening using Cologuard test 03/13/2018  . Severe persistent asthma 02/13/2018  . Vitamin D deficiency 03/17/2017  . Prediabetes 03/14/2017  . Microcytic anemia 03/14/2017  . IBS (irritable bowel syndrome) 03/12/2017  . Allergic rhinitis 03/12/2017  . Restless leg syndrome 03/12/2017  . Acid reflux 10/16/2016  . Essential hypertension 10/16/2016  . OSA on CPAP 12/08/2014  . BMI 50.0-59.9, adult ([place]) 05/31/2014   PCP:  Glean Hess, MD Pharmacy:   [place], [address] [place] [address] Phone: [phone] Fax: [phone]     Social Determinants of Health (SDOH) Interventions    Readmission Risk Interventions No flowsheet data found.

## 2019-05-02 NOTE — Progress Notes (Signed)
Patient discharging home. Instructions and prescriptions given to patient, verbalized understanding. IV removed. Son at bedside to take patient home.

## 2019-05-03 ENCOUNTER — Encounter: Payer: Self-pay | Admitting: *Deleted

## 2019-05-03 ENCOUNTER — Other Ambulatory Visit: Payer: Self-pay | Admitting: *Deleted

## 2019-05-03 NOTE — Anesthesia Postprocedure Evaluation (Signed)
Anesthesia Post Note  Patient: Samantha Mason  Procedure(s) Performed: COMPUTER ASSISTED TOTAL KNEE ARTHROPLASTY (Right Knee)  Patient location during evaluation: PACU Anesthesia Type: Spinal Level of consciousness: awake and alert Pain management: pain level controlled Vital Signs Assessment: post-procedure vital signs reviewed and stable Respiratory status: spontaneous breathing, nonlabored ventilation and respiratory function stable Cardiovascular status: blood pressure returned to baseline and stable Postop Assessment: no apparent nausea or vomiting Anesthetic complications: no     Last Vitals:  Vitals:   05/02/19 0022 05/02/19 1045  BP: (!) 154/54 (!) 125/55  Pulse: 96 97  Resp: 18 18  Temp: 36.8 C 36.8 C  SpO2: 97% 94%    Last Pain:  Vitals:   05/02/19 1427  TempSrc:   PainSc: [place]

## 2019-05-03 NOTE — Patient Outreach (Signed)
[name]) Care Management  05/03/2019  Samantha Mason 12/23/1950 [id number]   Transition of care call/case closure   Referral received: 04/19/19 Initial outreach: Preop call on 04/21/19, initial post discharge call on 05/03/19 Insurance: [employer]    Subjective: Initial successful telephone call to patient's preferred number in order to complete transition of care assessment; 2 HIPAA identifiers verified. Explained purpose of call and completed transition of care assessment.  States she is doing fairly well, having pain states currently taking extra strength tylenol to manage pain tolerable at 3/10.  She reports receiving paper  prescriptions to include oxycodone, tramadol and Lovenox at discharge, she uses [facility] outpatient pharmacy that was closed at discharge on yesterday.  She reports that her son is unable to go to pharmacy on today to pick up medication. 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 Discussed option of pharmacy closer to her , she declines prefers to use get filled at [place] outpatient pharmacy.  She has agreed to contact a friend to help with getting prescriptions today.  She denies post-operative problems, says surgical incisions area with dressing intact unremarkable.  She reports  tolerating diet, denies bowel or bladder problems. She reports that her son is at home to assist in her recovery. States he is able to assist,her up from sitting position when she is sitting in chair, she reports she is able walk to the bathroom using walker  states that she does  have a 3 in 1 at home but   managing with system with her bathroom set up. She reports getting up from the chair frequently during the day, she didn't sleep well in the bed on last night, so she got up to rest in recliner chair during the night.  She report having TED hose for use  at home , she hasn't put them on for today ,  but she will ask her son to assist .   She ongoing health issue of chronic condition of hypertension , asthma, she is agreeable to  referral to one of the [place] chronic disease management programs after explained.  She says she does  have the hospital indemnity She uses a [facility] outpatient pharmacy, at [place].  She denies educational needs related to staying safe during the COVID 19 pandemic.    Objective:  Samantha Mason  was hospitalized at [facility] from 12/11-12/13/20  For Right total knee Arthroplasty  Comorbidities include: Hypertension, Asthma, COPD, Obstructive sleep apnea.  She  was discharged to home on 05/02/19 with home health physical therapy, arranged [facility] with [facility] anticipating call from agency within 24 hours of discharge.    Assessment:  Patient voices good understanding of all discharge instructions.  See transition of care flowsheet for assessment details.   Plan:  Reviewed hospital discharge diagnosis of Right total knee arthroplasty  and discharge treatment plan using hospital discharge instructions, assessing medication adherence, reviewing problems requiring provider notification, and discussing the importance of follow up with surgeon as  directed. Reviewed [employer] announcements that all [place] members will receive the Healthy Lifestyle Premium rate in 2021.  Patient agreeable to referral to [facility] for chronic disease management .  Using [facility] website, Placed referral to  participate in [place]'s Active Health Management chronic disease management program,  for chronic conditions of Hypertension, Asthma Will send contact information patient personal email as requested for Hospital indemnity plan and [facility]  home health phone number, and active health  management .   Will plan follow up call in the next 2 business days,to verify she has home health in place and prescriptions filled.  Will route successful outreach letter with [place] [facility] pamphlet and 24 Hour Nurse Line Magnet to [facility] clinical pool to be mailed to patient's home address. Thanked patient for their services to [facility].  Joylene Draft, RN, [place] Management Coordinator  [phone]- Mobile [phone]- Toll Free Main Office

## 2019-05-04 ENCOUNTER — Other Ambulatory Visit: Payer: Self-pay | Admitting: *Deleted

## 2019-05-04 NOTE — Patient Outreach (Signed)
[place] [facility]) Care Management  05/04/2019  Samantha Mason [birth year] [id number]   Transition of care call/case closure   Referral received: 04/19/19 Initial outreach:12/2 preop screening call, initial post discharge call on 05/03/19.  Insurance: [employer]    Subjective: Successful follow up telephone call to patient's preferred number in order to follow up and complete transition of care.  HIPAA verified by 2 identifiers. She reports doing pretty good on today. She reports that she was able to get her prescriptions filled on yesterday, she began her lovenox on last evening. She states having knowledge on administering it , as she used it after her previous at her other knee surgery. She reports pain is controlled with prescribed medications , using oxycodone on last night. She continues to use ice to her knee. Reinforced use of incentive spirometry. She discussed continuing to move around home, walks down her hallway a few times a day, her son is assisting with getting up from her chair. She states she continues to work on exercises, taught by therapy inpatient . She reports that she has not received call from [facility] regarding initial home visit yet. I placed call to [facility] , representative verified referral and anticipates patient to receive a call from scheduled later today or tomorrow for visit arrangement, verified patient has contact number if concerns.  Discussed with patient outpatient pharmacy at [facility] does mail order delivery as an option, if difficulty getting to pharmacy , she appreciates the information but states she is set for right now using [facility] outpatient pharmacy.  Samantha Mason states she is out on [employer] and has made contact regarding hospital indemnity and filing a claim.    Objective:  Samantha Mason was hospitalized at [facility] from 12/11-12/13/20  For Right total knee Arthroplasty   Comorbidities include: Hypertension, Asthma, COPD, Obstructive sleep apnea.  She  was discharged to home on 12/13/20with home health physical therapy, arranged for follow [facility] with [facility].   Assessment:  Patient voices good understanding of all discharge instructions.  See transition of care flowsheet for assessment details.   Plan:  No ongoing care management needs identified so will close case to [place] [facility] services and route successful outreach letter with [facility] Management pamphlet and 24 Hour Nurse Line Magnet to [facility] clinical pool to be mailed to patient's home address. Thanked patient for their services to [facility].  Joylene Draft, RN, [place] Management Coordinator  [phone]- Mobile [phone]- Toll Free Main Office

## 2019-05-05 DIAGNOSIS — I700 Atherosclerosis of aorta: Secondary | ICD-10-CM | POA: Diagnosis not present

## 2019-05-05 DIAGNOSIS — D519 Vitamin B12 deficiency anemia, unspecified: Secondary | ICD-10-CM | POA: Diagnosis not present

## 2019-05-05 DIAGNOSIS — D509 Iron deficiency anemia, unspecified: Secondary | ICD-10-CM | POA: Diagnosis not present

## 2019-05-05 DIAGNOSIS — F329 Major depressive disorder, single episode, unspecified: Secondary | ICD-10-CM | POA: Diagnosis not present

## 2019-05-05 DIAGNOSIS — J449 Chronic obstructive pulmonary disease, unspecified: Secondary | ICD-10-CM | POA: Diagnosis not present

## 2019-05-05 DIAGNOSIS — Z471 Aftercare following joint replacement surgery: Secondary | ICD-10-CM | POA: Diagnosis not present

## 2019-05-05 DIAGNOSIS — I10 Essential (primary) hypertension: Secondary | ICD-10-CM | POA: Diagnosis not present

## 2019-05-05 DIAGNOSIS — J4550 Severe persistent asthma, uncomplicated: Secondary | ICD-10-CM | POA: Diagnosis not present

## 2019-05-05 DIAGNOSIS — G2581 Restless legs syndrome: Secondary | ICD-10-CM | POA: Diagnosis not present

## 2019-05-07 ENCOUNTER — Encounter: Payer: Self-pay | Admitting: Radiology

## 2019-05-07 ENCOUNTER — Emergency Department: Payer: 59

## 2019-05-07 ENCOUNTER — Emergency Department
Admission: EM | Admit: 2019-05-07 | Discharge: 2019-05-07 | Disposition: A | Discharge status: Home / Self Care | Payer: 59 | Attending: Emergency Medicine | Admitting: Emergency Medicine

## 2019-05-07 ENCOUNTER — Other Ambulatory Visit: Payer: Self-pay

## 2019-05-07 DIAGNOSIS — J45901 Unspecified asthma with (acute) exacerbation: Secondary | ICD-10-CM | POA: Diagnosis not present

## 2019-05-07 DIAGNOSIS — I700 Atherosclerosis of aorta: Secondary | ICD-10-CM | POA: Diagnosis not present

## 2019-05-07 DIAGNOSIS — Z471 Aftercare following joint replacement surgery: Secondary | ICD-10-CM | POA: Diagnosis not present

## 2019-05-07 DIAGNOSIS — D509 Iron deficiency anemia, unspecified: Secondary | ICD-10-CM | POA: Diagnosis not present

## 2019-05-07 DIAGNOSIS — J449 Chronic obstructive pulmonary disease, unspecified: Secondary | ICD-10-CM | POA: Diagnosis not present

## 2019-05-07 DIAGNOSIS — J4550 Severe persistent asthma, uncomplicated: Secondary | ICD-10-CM | POA: Diagnosis not present

## 2019-05-07 DIAGNOSIS — I10 Essential (primary) hypertension: Secondary | ICD-10-CM | POA: Insufficient documentation

## 2019-05-07 DIAGNOSIS — Z79899 Other long term (current) drug therapy: Secondary | ICD-10-CM | POA: Diagnosis not present

## 2019-05-07 DIAGNOSIS — D519 Vitamin B12 deficiency anemia, unspecified: Secondary | ICD-10-CM | POA: Diagnosis not present

## 2019-05-07 DIAGNOSIS — M79604 Pain in right leg: Secondary | ICD-10-CM | POA: Insufficient documentation

## 2019-05-07 DIAGNOSIS — R0602 Shortness of breath: Secondary | ICD-10-CM | POA: Diagnosis not present

## 2019-05-07 DIAGNOSIS — G2581 Restless legs syndrome: Secondary | ICD-10-CM | POA: Diagnosis not present

## 2019-05-07 DIAGNOSIS — F329 Major depressive disorder, single episode, unspecified: Secondary | ICD-10-CM | POA: Diagnosis not present

## 2019-05-07 DIAGNOSIS — Z96653 Presence of artificial knee joint, bilateral: Secondary | ICD-10-CM | POA: Diagnosis not present

## 2019-05-07 LAB — COMPREHENSIVE METABOLIC PANEL
ALT: 18 U/L (ref 0–44)
AST: 16 U/L (ref 15–41)
Albumin: 3 g/dL — ABNORMAL LOW (ref 3.5–5.0)
Alkaline Phosphatase: 63 U/L (ref 38–126)
Anion gap: 8 (ref 5–15)
BUN: 17 mg/dL (ref 8–23)
CO2: 27 mmol/L (ref 22–32)
Calcium: 9.3 mg/dL (ref 8.9–10.3)
Chloride: 105 mmol/L (ref 98–111)
Creatinine, Ser: 1.36 mg/dL — ABNORMAL HIGH (ref 0.44–1.00)
GFR calc Af Amer: 47 mL/min — ABNORMAL LOW (ref >60)
GFR calc non Af Amer: 40 mL/min — ABNORMAL LOW (ref >60)
Glucose, Bld: 112 mg/dL — ABNORMAL HIGH (ref 70–99)
Potassium: 4.3 mmol/L (ref 3.5–5.1)
Sodium: 140 mmol/L (ref 135–145)
Total Bilirubin: 0.6 mg/dL (ref 0.3–1.2)
Total Protein: 6.9 g/dL (ref 6.5–8.1)

## 2019-05-07 LAB — CBC
HCT: 29.7 % — ABNORMAL LOW (ref 36.0–46.0)
Hemoglobin: 9.6 g/dL — ABNORMAL LOW (ref 12.0–15.0)
MCH: 26.3 pg (ref 26.0–34.0)
MCHC: 32.3 g/dL (ref 30.0–36.0)
MCV: 81.4 fL (ref 80.0–100.0)
Platelets: 366 10*3/uL (ref 150–400)
RBC: 3.65 MIL/uL — ABNORMAL LOW (ref 3.87–5.11)
RDW: 15.7 % — ABNORMAL HIGH (ref 11.5–15.5)
WBC: 9.2 10*3/uL (ref 4.0–10.5)
nRBC: 0 % (ref 0.0–0.2)

## 2019-05-07 LAB — TROPONIN I (HIGH SENSITIVITY): Troponin I (High Sensitivity): 5 ng/L (ref <18)

## 2019-05-07 MED ORDER — PREDNISONE 10 MG (21) PO TBPK: ORAL_TABLET | ORAL | 0 refills | Status: DC

## 2019-05-07 MED ORDER — IOHEXOL 350 MG/ML SOLN
75.0000 mL | Freq: Once | INTRAVENOUS | Status: AC | PRN
  Administered 2019-05-07: 75 mL via INTRAVENOUS

## 2019-05-07 MED ORDER — METHYLPREDNISOLONE SODIUM SUCC 125 MG IJ SOLR
125.0000 mg | Freq: Once | INTRAMUSCULAR | Status: AC
  Administered 2019-05-07: 125 mg via INTRAVENOUS
  Filled 2019-05-07: qty 2

## 2019-05-07 MED ORDER — OXYCODONE-ACETAMINOPHEN 5-325 MG PO TABS
1.0000 | ORAL_TABLET | Freq: Once | ORAL | Status: AC
  Administered 2019-05-07: 1 via ORAL
  Filled 2019-05-07: qty 1

## 2019-05-07 NOTE — Discharge Instructions (Addendum)
Please seek medical attention for any high fevers, chest pain, shortness of breath, change in behavior, persistent vomiting, bloody stool or any other new or concerning symptoms.  

## 2019-05-07 NOTE — ED Notes (Signed)
Patient transported to Ultrasound 

## 2019-05-07 NOTE — ED Triage Notes (Signed)
Pt states last night she had difficulty breathing throughout the night, states that she hasn't had breathing problems since march and doesn't feel like it is bronchitis, pt states she is post op knee surg a week. Pt states pt visited her today and did a test that hurt the back of her calf, pt states she used her inhaler 4 times over night and last use was 0730 this am

## 2019-05-07 NOTE — ED Provider Notes (Signed)
[facility] Emergency Department Provider Note   ____________________________________________   I have reviewed the triage vital signs and the nursing notes.   HISTORY  Chief Complaint Wheezing, Shortness of Breath, and Post-op Problem   History limited by: Not Limited   HPI Samantha Mason is a 68 y.o. female who presents to the emergency department today because of concern for possible blood clot. The patient underwent right knee surgery roughly one week ago. Last night when she was in bed she had somewhat sudden onset of shortness of breath. This was accompanied by chest tightness. The patient did use her inhaler at home which did offer some relief for the patient's symptoms. She says that when nurse came out today and squeezed her right calf she had severe pain. She has noticed that the pain in her leg has been increasing over the past couple of days. She denies any fevers.    Records reviewed. Per medical record review patient has a history of asthma, COPD.   Past Medical History:  Diagnosis Date  . Acute bronchitis   . Anemia   . Anxiety and depression   . Arthritis   . Asthma   . B12 deficiency 03/14/2017  . Chest pain 07/02/2018  . COPD (chronic obstructive pulmonary disease) ([place]) 07/24/2018  . Family history of blood clots 07/16/2017  . Irritable bowel syndrome   . Malignant hypertension   . Obesity   . OSA (obstructive sleep apnea)    uses cpap  . Pneumonia 2017  . Restless leg syndrome   . Stroke [facility]) 1995   mild    Patient Active Problem List   Diagnosis Date Noted  . Total knee replacement status 04/30/2019  . Primary osteoarthritis of right knee 02/13/2019  . S/P total knee arthroplasty, left 02/13/2019  . Aortic atherosclerosis ([place]) 08/01/2018  . B12 deficiency anemia 07/07/2018  . Benign neoplasm of ascending colon   . Positive colorectal cancer screening using Cologuard test 03/13/2018  . Severe persistent asthma 02/13/2018   . Vitamin D deficiency 03/17/2017  . Prediabetes 03/14/2017  . Microcytic anemia 03/14/2017  . IBS (irritable bowel syndrome) 03/12/2017  . Allergic rhinitis 03/12/2017  . Restless leg syndrome 03/12/2017  . Acid reflux 10/16/2016  . Essential hypertension 10/16/2016  . OSA on CPAP 12/08/2014  . BMI 50.0-59.9, adult ([place]) 05/31/2014    Past Surgical History:  Procedure Laterality Date  . CHOLECYSTECTOMY    . COLONOSCOPY WITH PROPOFOL N/A 03/31/2018   Procedure: COLONOSCOPY WITH PROPOFOL;  Surgeon: Lucilla Lame, MD;  Location: [facility] ENDOSCOPY;  Service: Endoscopy;  Laterality: N/A;  . JOINT REPLACEMENT  2014  . KNEE ARTHROPLASTY Right 04/30/2019   Procedure: COMPUTER ASSISTED TOTAL KNEE ARTHROPLASTY;  Surgeon: Dereck Leep, MD;  Location: [facility] ORS;  Service: Orthopedics;  Laterality: Right;  . saliva gland removal  1998  . TOTAL KNEE ARTHROPLASTY Left 2014  . TUBAL LIGATION Bilateral     Prior to Admission medications   Medication Sig Start Date End Date Taking? Authorizing Provider  acetaminophen (TYLENOL) 500 MG tablet Take 2 tablets (1,000 mg total) by mouth every 8 (eight) hours as needed for moderate pain. 04/09/17   Plonk, [name], MD  albuterol (ACCUNEB) 1.25 MG/3ML nebulizer solution Take 1 ampule by nebulization every 6 (six) hours as needed for wheezing.    [provider]  cetirizine (ZYRTEC) 10 MG tablet Take 1 tablet (10 mg total) by mouth daily. Patient taking differently: Take 10 mg by mouth every morning.  12/07/14   Ashok Norris, MD  cholestyramine Lucrezia Starch) 4 GM/DOSE powder MIX 4GM AND DRINK BY MOUTH DAILY. *SANDOZ ONLY* Patient taking differently: Take 4 g by mouth at bedtime.  02/23/19   Glean Hess, MD  diphenoxylate-atropine (LOMOTIL) 2.5-0.025 MG tablet Take 1 tablet by mouth 3 (three) times daily as needed for diarrhea or loose stools. 10/16/16   Roselee Nova, MD  enoxaparin (LOVENOX) 40 MG/0.4ML injection Inject 0.4 mLs (40 mg total)  into the skin daily for 14 days. 05/02/19 05/16/19  Duanne Guess, PA-C  hydrochlorothiazide (HYDRODIURIL) 25 MG tablet Take 1 tablet (25 mg total) by mouth daily. 01/13/19   Glean Hess, MD  lisinopril (ZESTRIL) 10 MG tablet Take 1 tablet (10 mg total) by mouth daily. Patient taking differently: Take 10 mg by mouth every morning.  02/15/19 05/16/19  Glean Hess, MD  modafinil (PROVIGIL) 200 MG tablet Take 1 tablet (200 mg total) by mouth daily as needed. Patient not taking: Reported on 04/19/2019 02/15/19   Glean Hess, MD  oxyCODONE (OXY IR/ROXICODONE) 5 MG immediate release tablet Take 1-2 tablets (5-10 mg total) by mouth every 4 (four) hours as needed for moderate pain (pain score 4-6). 05/02/19   Duanne Guess, PA-C  pantoprazole (PROTONIX) 40 MG tablet TAKE 1 TABLET BY MOUTH DAILY Patient taking differently: Take 40 mg by mouth every morning.  11/09/18   Glean Hess, MD  rOPINIRole (REQUIP) 1 MG tablet Take 1 tablet (1 mg total) by mouth 2 (two) times daily. Patient taking differently: Take 1 mg by mouth at bedtime. And prn throughout the day if needed 01/27/19   Glean Hess, MD  SYMBICORT 160-4.5 MCG/ACT inhaler INHALE 2 PUFFS INTO THE LUNGS 2 (TWO) TIMES DAILY. Patient taking differently: Inhale 2 puffs into the lungs 2 (two) times daily.  03/20/19   Glean Hess, MD  traMADol (ULTRAM) 50 MG tablet Take 1-2 tablets (50-100 mg total) by mouth every 4 (four) hours as needed for moderate pain. 05/02/19   Duanne Guess, PA-C  vitamin B-12 (CYANOCOBALAMIN) 1000 MCG tablet Take 1,000 mcg by mouth daily.    [provider]  Vitamin D, Ergocalciferol, (DRISDOL) 1.25 MG (50000 UT) CAPS capsule Take 1 capsule (50,000 Units total) by mouth every 7 (seven) days. 02/16/19   Glean Hess, MD    Allergies Nuvigil [armodafinil], Penicillins, and Gabapentin  Family History  Problem Relation Age of Onset  . Dementia Mother   . COPD Father        69   . Diabetes Father   . Heart failure Brother        2010    Social History Social History   Tobacco Use  . Smoking status: Never Smoker  . Smokeless tobacco: Never Used  Substance Use Topics  . Alcohol use: No    Alcohol/week: 0.0 standard drinks  . Drug use: No    Review of Systems Constitutional: No fever/chills Eyes: No visual changes. ENT: No sore throat. Cardiovascular: Positive for chest tightness.  Respiratory: Positive for shortness of breath.  Gastrointestinal: No abdominal pain.  No nausea, no vomiting.  No diarrhea.   Genitourinary: Negative for dysuria. Musculoskeletal: Positive for right knee and right calf pain.  Skin: Negative for rash. Neurological: Negative for headaches, focal weakness or numbness.  ____________________________________________   PHYSICAL EXAM:  VITAL SIGNS: ED Triage Vitals  Enc Vitals Group     BP 05/07/19 1149 (!) 170/69  Pulse Rate 05/07/19 1149 (!) 104     Resp 05/07/19 1149 18     Temp 05/07/19 1149 99 F (37.2 C)     Temp Source 05/07/19 1149 Oral     SpO2 05/07/19 1149 98 %     Weight 05/07/19 1150 296 lb 15.4 oz (134.7 kg)     Height 05/07/19 1150 5\' 4"  (1.626 m)     Head Circumference --      Peak Flow --      Pain Score 05/07/19 1150 7   Constitutional: Alert and oriented.  Eyes: Conjunctivae are normal.  ENT      Head: Normocephalic and atraumatic.      Nose: No congestion/rhinnorhea.      Mouth/Throat: Mucous membranes are moist.      Neck: No stridor. Hematological/Lymphatic/Immunilogical: No cervical lymphadenopathy. Cardiovascular: Normal rate, regular rhythm.  No murmurs, rubs, or gallops.  Respiratory: Normal respiratory effort without tachypnea nor retractions. Positive for bilateral and diffuse expiratory wheezing.  Gastrointestinal: Soft and non tender. No rebound. No guarding.  Genitourinary: Deferred Musculoskeletal: Normal range of motion in all extremities. Right knee with vertical incision  scar. Some tenderness to palpation of the right calf.  Neurologic:  Normal speech and language. No gross focal neurologic deficits are appreciated.  Skin:  Skin is warm, dry and intact. No rash noted. Psychiatric: Mood and affect are normal. Speech and behavior are normal. Patient exhibits appropriate insight and judgment.  ____________________________________________    LABS (pertinent positives/negatives)  CBC wbc 9.2, hgb 9.6, plt 366 CMP wnl except glu 112, cr 1.36, alb 3.0 Trop 5  ____________________________________________    RADIOLOGY  CT angio No evidence of PE and no acute cardiopulmonary disease  [place] right lower extremity Negative for DVT ____________________________________________   PROCEDURES  Procedures  ____________________________________________   INITIAL IMPRESSION / ASSESSMENT AND PLAN / ED COURSE  Pertinent labs & imaging results that were available during my care of the patient were reviewed by me and considered in my medical decision making (see chart for details).   Patient presented to the emergency department today with primary concern for shortness of breath, chest tightness and concern for possible blood clot.  CT angios performed prior to my evaluation was negative for acute pulmonary embolism.  I did obtain a right lower extremity ultrasound which also was negative for DVT.  This point I think likely suffering from asthma exacerbation.  Patient was given steroids here.  Will give patient further steroids as a prescription.  Discussed with patient continuation of inhalers.  Did offer Covid test however patient declined.  ___________________________________________   FINAL CLINICAL IMPRESSION(S) / ED DIAGNOSES  Final diagnoses:  Exacerbation of asthma, unspecified asthma severity, unspecified whether persistent  Right leg pain     Note: This dictation was prepared with Dragon dictation. Any transcriptional errors that result from this  process are unintentional     Nance Pear, MD 05/07/19 1801

## 2019-05-10 ENCOUNTER — Other Ambulatory Visit: Payer: Self-pay | Admitting: Internal Medicine

## 2019-05-10 DIAGNOSIS — J4550 Severe persistent asthma, uncomplicated: Secondary | ICD-10-CM | POA: Diagnosis not present

## 2019-05-10 DIAGNOSIS — K219 Gastro-esophageal reflux disease without esophagitis: Secondary | ICD-10-CM

## 2019-05-10 DIAGNOSIS — F329 Major depressive disorder, single episode, unspecified: Secondary | ICD-10-CM | POA: Diagnosis not present

## 2019-05-10 DIAGNOSIS — D509 Iron deficiency anemia, unspecified: Secondary | ICD-10-CM | POA: Diagnosis not present

## 2019-05-10 DIAGNOSIS — G2581 Restless legs syndrome: Secondary | ICD-10-CM | POA: Diagnosis not present

## 2019-05-10 DIAGNOSIS — D519 Vitamin B12 deficiency anemia, unspecified: Secondary | ICD-10-CM | POA: Diagnosis not present

## 2019-05-10 DIAGNOSIS — J449 Chronic obstructive pulmonary disease, unspecified: Secondary | ICD-10-CM | POA: Diagnosis not present

## 2019-05-10 DIAGNOSIS — Z471 Aftercare following joint replacement surgery: Secondary | ICD-10-CM | POA: Diagnosis not present

## 2019-05-10 DIAGNOSIS — I10 Essential (primary) hypertension: Secondary | ICD-10-CM | POA: Diagnosis not present

## 2019-05-10 DIAGNOSIS — I700 Atherosclerosis of aorta: Secondary | ICD-10-CM | POA: Diagnosis not present

## 2019-05-12 DIAGNOSIS — G2581 Restless legs syndrome: Secondary | ICD-10-CM | POA: Diagnosis not present

## 2019-05-12 DIAGNOSIS — J4550 Severe persistent asthma, uncomplicated: Secondary | ICD-10-CM | POA: Diagnosis not present

## 2019-05-12 DIAGNOSIS — F329 Major depressive disorder, single episode, unspecified: Secondary | ICD-10-CM | POA: Diagnosis not present

## 2019-05-12 DIAGNOSIS — I10 Essential (primary) hypertension: Secondary | ICD-10-CM | POA: Diagnosis not present

## 2019-05-12 DIAGNOSIS — J449 Chronic obstructive pulmonary disease, unspecified: Secondary | ICD-10-CM | POA: Diagnosis not present

## 2019-05-12 DIAGNOSIS — D519 Vitamin B12 deficiency anemia, unspecified: Secondary | ICD-10-CM | POA: Diagnosis not present

## 2019-05-12 DIAGNOSIS — D509 Iron deficiency anemia, unspecified: Secondary | ICD-10-CM | POA: Diagnosis not present

## 2019-05-12 DIAGNOSIS — Z471 Aftercare following joint replacement surgery: Secondary | ICD-10-CM | POA: Diagnosis not present

## 2019-05-12 DIAGNOSIS — I700 Atherosclerosis of aorta: Secondary | ICD-10-CM | POA: Diagnosis not present

## 2019-05-13 ENCOUNTER — Ambulatory Visit: Payer: 59 | Admitting: Physical Therapy

## 2019-05-18 ENCOUNTER — Ambulatory Visit: Payer: 59 | Attending: Orthopedic Surgery | Admitting: Physical Therapy

## 2019-05-18 ENCOUNTER — Encounter: Payer: Self-pay | Admitting: Physical Therapy

## 2019-05-18 ENCOUNTER — Other Ambulatory Visit: Payer: Self-pay

## 2019-05-18 DIAGNOSIS — M6281 Muscle weakness (generalized): Secondary | ICD-10-CM | POA: Diagnosis not present

## 2019-05-18 DIAGNOSIS — M25661 Stiffness of right knee, not elsewhere classified: Secondary | ICD-10-CM | POA: Insufficient documentation

## 2019-05-18 DIAGNOSIS — Z96651 Presence of right artificial knee joint: Secondary | ICD-10-CM | POA: Insufficient documentation

## 2019-05-18 DIAGNOSIS — R269 Unspecified abnormalities of gait and mobility: Secondary | ICD-10-CM | POA: Diagnosis not present

## 2019-05-18 DIAGNOSIS — M25561 Pain in right knee: Secondary | ICD-10-CM | POA: Diagnosis not present

## 2019-05-19 NOTE — Therapy (Signed)
[facility], [place] [address] [place], [place], [address] Phone: [phone]   Fax:  [phone]  Physical Therapy Evaluation  Patient Details  Name: Samantha Mason MRN: [id number] Date of Birth: [birth year] Referring Provider (PT): Dr. Marry Guan   Encounter Date: 05/18/2019  PT End of Session - 05/19/19 1249    Visit Number  1    Number of Visits  8    Date for PT Re-Evaluation  06/15/19    Authorization - Visit Number  1    Authorization - Number of Visits  10    PT Start Time  4818    PT Stop Time  1521    PT Time Calculation (min)  56 min    Activity Tolerance  Patient tolerated treatment well;Patient limited by pain    Behavior During Therapy  [facility] for tasks assessed/performed       Past Medical History:  Diagnosis Date  . Acute bronchitis   . Anemia   . Anxiety and depression   . Arthritis   . Asthma   . B12 deficiency 03/14/2017  . Chest pain 07/02/2018  . COPD (chronic obstructive pulmonary disease) ([place]) 07/24/2018  . Family history of blood clots 07/16/2017  . Irritable bowel syndrome   . Malignant hypertension   . Obesity   . OSA (obstructive sleep apnea)    uses cpap  . Pneumonia 2017  . Restless leg syndrome   . Stroke [facility]) 1995   mild    Past Surgical History:  Procedure Laterality Date  . CHOLECYSTECTOMY    . COLONOSCOPY WITH PROPOFOL N/A 03/31/2018   Procedure: COLONOSCOPY WITH PROPOFOL;  Surgeon: Lucilla Lame, MD;  [facility]: [facility] ENDOSCOPY;  Service: Endoscopy;  Laterality: N/A;  . JOINT REPLACEMENT  2014  . KNEE ARTHROPLASTY Right 04/30/2019   Procedure: COMPUTER ASSISTED TOTAL KNEE ARTHROPLASTY;  Surgeon: Dereck Leep, MD;  Location: [facility] ORS;  Service: Orthopedics;  Laterality: Right;  . saliva gland removal  1998  . TOTAL KNEE ARTHROPLASTY Left 2014  . TUBAL LIGATION Bilateral     There were no vitals filed for this visit.   Subjective Assessment - 05/19/19 1237    Subjective  Pt. s/p R  TKA on 05/11/2019 and has been receiving HHPT.  Pt. reports 6/10 R knee pain walking in to PT with use of RW.  Pt. reports 0/10 R knee pain at best.  Pt. reports on 12/18 she was having increase pain in R lower leg (no DVT).  Pt. lives with son and is hoping to return to work at [employer] par-time vs. full-time.    Patient is accompained by:  Family member    Pertinent History  Pt. known to PT clinic.  See medical chart.    Limitations  Standing;Walking;House hold activities    Patient Stated Goals  Increase R knee ROM/ strength to improve pain-free mobility    Currently in Pain?  Yes    Pain Score  6     Pain Location  Knee    Pain Orientation  Right    Pain Descriptors / Indicators  Tender;Tightness;Aching    Pain Type  Surgical pain;Acute pain         [facility] PT Assessment - 05/19/19 0001      Assessment   Medical Diagnosis  s/p R TKA    Referring Provider (PT)  Dr. 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 Amb. With RW currently, pt. Has SPC at home.   Stitches removed last week/ steri strips in place  Pt. Not sleeping well at night Pt. Taking Tramadol for pain/ avoiding Oxy at this time. Pt. Is hoping to return to driving at 6 weeks s/p   Objective measurements completed on examination: See above findings.     Discussed ex. Program Nustep L3 10 min. Seat position 9-7.      PT Education - 05/19/19 1248    Education Details  Discussed  HEP (reviewed HHPT ex. program)- supine based    Person(s) Educated  Patient    Methods  Explanation;Demonstration    Comprehension  Verbalized understanding;Returned demonstration          PT Long Term Goals - 05/19/19 1302      PT LONG TERM GOAL #1   Title  Pt. will increase FOTO to 53 to improve functional mobility.    Baseline  Initial FOTO: 31    Time  4    Period  Weeks    Status  New    Target Date  06/15/19      PT LONG TERM GOAL #2   Title  Pt. will increase R knee AROM (0 to >110 deg.) to improve standing/walking.    Baseline  L knee AROM: 0 to 111 deg. R knee AROM: -4 to 84 deg. R knee ROM flexion 88 deg.    Time  4    Period  Weeks    Status  New    Target Date  06/15/19      PT LONG TERM GOAL #3   Title  Pt. independent with HEP to increase LE strength 1/2 muscle grade to improve standing tolerance/ return to work.    Baseline  L LE strength grossly 5/5 MMT except hip flexion/ abduction 4+/5 MMT. R LE strength grossly 4+/5 MMT except hip flexion 4/5 MMT    Time  4    Period  Weeks    Status  New    Target Date  06/15/19      PT LONG TERM GOAL #4   Title  Pt. will ambulate with consistent 2-point gait pattern with use of SPC on inside/ outside surfaces to improve mobility.    Baseline  Moderate R antalgic gait pattern with use of RW    Time  4    Period  Weeks    Status  New    Target Date  06/15/19          Plan - 05/19/19 1251    Clinical Impression Statement  Pt. is a pleasant 68 y/o female s/p R TKA on 04/30/2019.  Pt. reports 6/10 R knee pain with walking/ knee flexion and 0/10 at best.  Pt. reports increase R knee swelling over past week.  R/L circumferential measurements: joint line (59.5/55 cm), distal quad (65/63 cm), mid-calf (53.5/49 cm).  L knee AROM: 0 to 111 deg.  R knee AROM: -4 to 84 deg.  R knee ROM flexion 88 deg.  L LE strength grossly 5/5 MMT except hip flexion/ abduction 4+/5 MMT.  R LE strength grossly 4+/5 MMT except hip flexion 4/5  MMT.  Pt. ambulates with moderate R antalgic gait pattern with RW.  Pt. ambulates with limited R hip/knee flexion and step length due to R knee joint stiffness.  FOTO: initial 31/ goal 53.  PT unable to access R knee incision due to TED hose/ pants on today.  Pt. will benefit from skilled PT services to increase R knee ROM/ strength to improve pain-free functional mobility.    Stability/Clinical Decision Making  Evolving/Moderate complexity    Clinical Decision Making  Moderate    Rehab Potential  Good    PT Frequency  2x / week    PT Duration  4 weeks    PT Treatment/Interventions  ADLs/Self Care Home Management;Aquatic Therapy;Cryotherapy;Moist Heat;Electrical Stimulation;Gait training;Stair training;Functional mobility training;Neuromuscular re-education;Balance training;Therapeutic exercise;Therapeutic activities;Patient/family education;Manual techniques;Passive range of motion    PT Next Visit Plan  Issue HEP    PT Home Exercise Plan  discussed HHPT ex. program.       Patient will benefit from skilled therapeutic intervention in order to improve the following deficits and impairments:  Abnormal gait, Decreased balance, Decreased endurance, Decreased mobility, Difficulty walking, Hypomobility, Obesity, Improper body mechanics, Decreased range of motion, Decreased activity tolerance, Decreased strength, Impaired flexibility, Postural dysfunction, Pain  Visit Diagnosis: Status post total right knee replacement  Joint stiffness of knee, right  Muscle weakness (generalized)  Gait difficulty  Acute pain of right knee     Problem List Patient Active Problem List   Diagnosis Date Noted  . Total knee replacement status 04/30/2019  . Primary osteoarthritis of right knee 02/13/2019  . S/P total knee arthroplasty, left 02/13/2019  . Aortic atherosclerosis ([place]) 08/01/2018  . B12 deficiency anemia 07/07/2018  . Benign neoplasm of ascending colon   . Positive colorectal cancer screening  using Cologuard test 03/13/2018  . Severe persistent asthma 02/13/2018  . Vitamin D deficiency 03/17/2017  . Prediabetes 03/14/2017  . Microcytic anemia 03/14/2017  . IBS (irritable bowel syndrome) 03/12/2017  . Allergic rhinitis 03/12/2017  . Restless leg syndrome 03/12/2017  . Acid reflux 10/16/2016  . Essential hypertension 10/16/2016  . OSA on CPAP 12/08/2014  . BMI 50.0-59.9, adult ([place]) 05/31/2014   Pura Spice, PT, DPT # [phone] 05/19/2019, 1:09 PM  [place] [facility] [address] [place], [place], [address] Phone: [phone]   Fax:  [phone]  Name: RETTIE LAIRD MRN: [id number] Date of Birth: 05-07-1951

## 2019-05-20 ENCOUNTER — Ambulatory Visit: Payer: 59 | Admitting: Physical Therapy

## 2019-05-20 ENCOUNTER — Encounter: Payer: Self-pay | Admitting: Physical Therapy

## 2019-05-20 ENCOUNTER — Other Ambulatory Visit: Payer: Self-pay

## 2019-05-20 DIAGNOSIS — M25661 Stiffness of right knee, not elsewhere classified: Secondary | ICD-10-CM

## 2019-05-20 DIAGNOSIS — M6281 Muscle weakness (generalized): Secondary | ICD-10-CM | POA: Diagnosis not present

## 2019-05-20 DIAGNOSIS — R269 Unspecified abnormalities of gait and mobility: Secondary | ICD-10-CM

## 2019-05-20 DIAGNOSIS — Z96651 Presence of right artificial knee joint: Secondary | ICD-10-CM

## 2019-05-20 DIAGNOSIS — M25561 Pain in right knee: Secondary | ICD-10-CM | POA: Diagnosis not present

## 2019-05-20 NOTE — Therapy (Signed)
[place] [facility] [address] [place], [place], [address] Phone: [phone]   Fax:  [phone]  Physical Therapy Treatment  Patient Details  Name: Samantha Mason MRN: [id number] Date of Birth: January 01, 1951 Referring Provider (PT): Dr. Marry Guan   Encounter Date: 05/20/2019  PT End of Session - 05/20/19 1759    Visit Number  2    Number of Visits  8    Date for PT Re-Evaluation  06/15/19    Authorization - Visit Number  2    Authorization - Number of Visits  10    PT Start Time  2951    PT Stop Time  1603    PT Time Calculation (min)  49 min    Activity Tolerance  Patient tolerated treatment well;Patient limited by pain    Behavior During Therapy  [facility] for tasks assessed/performed       Past Medical History:  Diagnosis Date  . Acute bronchitis   . Anemia   . Anxiety and depression   . Arthritis   . Asthma   . B12 deficiency 03/14/2017  . Chest pain 07/02/2018  . COPD (chronic obstructive pulmonary disease) ([place]) 07/24/2018  . Family history of blood clots 07/16/2017  . Irritable bowel syndrome   . Malignant hypertension   . Obesity   . OSA (obstructive sleep apnea)    uses cpap  . Pneumonia 2017  . Restless leg syndrome   . Stroke [facility]) 1995   mild    Past Surgical History:  Procedure Laterality Date  . CHOLECYSTECTOMY    . COLONOSCOPY WITH PROPOFOL N/A 03/31/2018   Procedure: COLONOSCOPY WITH PROPOFOL;  Surgeon: Lucilla Lame, MD;  Location: [facility] ENDOSCOPY;  Service: Endoscopy;  Laterality: N/A;  . JOINT REPLACEMENT  2014  . KNEE ARTHROPLASTY Right 04/30/2019   Procedure: COMPUTER ASSISTED TOTAL KNEE ARTHROPLASTY;  Surgeon: Dereck Leep, MD;  Location: [facility] ORS;  Service: Orthopedics;  Laterality: Right;  . saliva gland removal  1998  . TOTAL KNEE ARTHROPLASTY Left 2014  . TUBAL LIGATION Bilateral     There were no vitals filed for this visit.  Subjective Assessment - 05/20/19 1555    Subjective  Pt. entered PT  with use of SPC and wearing TED hose/ shorts.  Pt. presents with less swelling in R knee as compared to initial evaluation.    Patient is accompained by:  Family member    Pertinent History  Pt. known to PT clinic.  See medical chart.    Limitations  Standing;Walking;House hold activities    Patient Stated Goals  Increase R knee ROM/ strength to improve pain-free mobility    Currently in Pain?  Yes    Pain Score  4     Pain Location  Knee    Pain Orientation  Right         There.ex.:  Nustep L0-3 seat 8-7 13 min. B UE/LE (consistent cadence) Reviewed HEP Supine heel slides 20x. Supine SLR on R 10x (fatigue)- good R knee extension Standing knee flexion/ walking f/b in //-bars Ambulate in clinic with [facility] working on increase hip flexion/ heel strike.   Manual tx.:  Supine R patellar mobs. (sup./inf.) 5x each.  Assessment of incision (1 staple in place)- minimal scabbing/ good healing Supine R knee AA/PROM flexion 6x with static holds (pain limited in R calf)- 91 deg. Flexion STM To R distal quad/ hamstring/ proximal gastroc.   Pt. Will ice at home.  PT Long Term Goals - 05/19/19 1302      PT LONG TERM GOAL #1   Title  Pt. will increase FOTO to 53 to improve functional mobility.    Baseline  Initial FOTO: 31    Time  4    Period  Weeks    Status  New    Target Date  06/15/19      PT LONG TERM GOAL #2   Title  Pt. will increase R knee AROM (0 to >110 deg.) to improve standing/walking.    Baseline  L knee AROM: 0 to 111 deg. R knee AROM: -4 to 84 deg. R knee ROM flexion 88 deg.    Time  4    Period  Weeks    Status  New    Target Date  06/15/19      PT LONG TERM GOAL #3   Title  Pt. independent with HEP to increase LE strength 1/2 muscle grade to improve standing tolerance/ return to work.    Baseline  L LE strength grossly 5/5 MMT except hip flexion/ abduction 4+/5 MMT. R LE strength grossly 4+/5 MMT except hip flexion 4/5 MMT    Time  4    Period  Weeks     Status  New    Target Date  06/15/19      PT LONG TERM GOAL #4   Title  Pt. will ambulate with consistent 2-point gait pattern with use of SPC on inside/ outside surfaces to improve mobility.    Baseline  Moderate R antalgic gait pattern with use of RW    Time  4    Period  Weeks    Status  New    Target Date  06/15/19            Plan - 05/20/19 1800    Clinical Impression Statement  Pt. presents with good incision healing, but 1 staple noted.  Pt. will contact MD to discuss removing staple soon.  Pt. able to increase R knee flexion to 91 deg. in supine position.  Pain limited and c/o increase R gastroc pain with knee flexion.  Redness noted at distal incision but pt. reports she had a reaction of cold/ frostbite a week ago.  PT encouraged pt. to focus on R knee flexion and heel strike with gait to improve overall mobility.    Stability/Clinical Decision Making  Evolving/Moderate complexity    Clinical Decision Making  Moderate    Rehab Potential  Good    PT Frequency  2x / week    PT Duration  4 weeks    PT Treatment/Interventions  ADLs/Self Care Home Management;Aquatic Therapy;Cryotherapy;Moist Heat;Electrical Stimulation;Gait training;Stair training;Functional mobility training;Neuromuscular re-education;Balance training;Therapeutic exercise;Therapeutic activities;Patient/family education;Manual techniques;Passive range of motion    PT Next Visit Plan  Issue HEP    PT Home Exercise Plan  discussed HHPT ex. program.       Patient will benefit from skilled therapeutic intervention in order to improve the following deficits and impairments:  Abnormal gait, Decreased balance, Decreased endurance, Decreased mobility, Difficulty walking, Hypomobility, Obesity, Improper body mechanics, Decreased range of motion, Decreased activity tolerance, Decreased strength, Impaired flexibility, Postural dysfunction, Pain  Visit Diagnosis: Status post total right knee replacement  Joint stiffness  of knee, right  Muscle weakness (generalized)  Gait difficulty  Acute pain of right knee     Problem List Patient Active Problem List   Diagnosis Date Noted  . Total knee replacement status 04/30/2019  . Primary  osteoarthritis of right knee 02/13/2019  . S/P total knee arthroplasty, left 02/13/2019  . Aortic atherosclerosis ([place]) 08/01/2018  . B12 deficiency anemia 07/07/2018  . Benign neoplasm of ascending colon   . Positive colorectal cancer screening using Cologuard test 03/13/2018  . Severe persistent asthma 02/13/2018  . Vitamin D deficiency 03/17/2017  . Prediabetes 03/14/2017  . Microcytic anemia 03/14/2017  . IBS (irritable bowel syndrome) 03/12/2017  . Allergic rhinitis 03/12/2017  . Restless leg syndrome 03/12/2017  . Acid reflux 10/16/2016  . Essential hypertension 10/16/2016  . OSA on CPAP 12/08/2014  . BMI 50.0-59.9, adult ([place]) 05/31/2014   Pura Spice, PT, DPT # [phone] 05/20/2019, 6:05 PM  [place] [facility] [address] [place], [place], [address] Phone: [phone]   Fax:  [phone]  Name: Samantha Mason MRN: [id number] Date of Birth: [birth year]

## 2019-05-25 ENCOUNTER — Encounter: Payer: Self-pay | Admitting: Physical Therapy

## 2019-05-25 ENCOUNTER — Other Ambulatory Visit: Payer: Self-pay

## 2019-05-25 ENCOUNTER — Ambulatory Visit: Payer: 59 | Attending: Orthopedic Surgery | Admitting: Physical Therapy

## 2019-05-25 DIAGNOSIS — Z96651 Presence of right artificial knee joint: Secondary | ICD-10-CM | POA: Diagnosis not present

## 2019-05-25 DIAGNOSIS — M25661 Stiffness of right knee, not elsewhere classified: Secondary | ICD-10-CM | POA: Diagnosis not present

## 2019-05-25 DIAGNOSIS — M25561 Pain in right knee: Secondary | ICD-10-CM | POA: Insufficient documentation

## 2019-05-25 DIAGNOSIS — R269 Unspecified abnormalities of gait and mobility: Secondary | ICD-10-CM | POA: Insufficient documentation

## 2019-05-25 DIAGNOSIS — M6281 Muscle weakness (generalized): Secondary | ICD-10-CM | POA: Insufficient documentation

## 2019-05-25 NOTE — Patient Instructions (Signed)
Access Code: [email or web address]  URL: [email or web address]  Date: 05/25/2019  Prepared by: Dorcas Carrow   Exercises  Supine Quad Set - 10 reps - 3 sets - 3 s hold - 1x daily - 7x weekly  Supine Heel Slides - 15 reps - 3 sets - 5 s hold - 1x daily - 7x weekly  Mini Squat with Counter Support - 10 reps - 3 sets - 1x daily - 7x weekly  Standing March with Counter Support - 10 reps - 3 sets - 1x daily - 7x weekly  Standing Hip Abduction with Counter Support - 10 reps - 3 sets - 1x daily - 7x weekly  Standing Hamstring Stretch with Step - 3 reps - 1 sets - 30 s hold - 1x daily - 7x weekly  Standing Knee Flexion Stretch on Step - 3 reps - 1 sets - 30 s hold - 1x daily - 7x weekly

## 2019-05-27 ENCOUNTER — Other Ambulatory Visit: Payer: Self-pay

## 2019-05-27 ENCOUNTER — Ambulatory Visit: Payer: 59 | Admitting: Physical Therapy

## 2019-05-27 DIAGNOSIS — M25661 Stiffness of right knee, not elsewhere classified: Secondary | ICD-10-CM | POA: Diagnosis not present

## 2019-05-27 DIAGNOSIS — R269 Unspecified abnormalities of gait and mobility: Secondary | ICD-10-CM | POA: Diagnosis not present

## 2019-05-27 DIAGNOSIS — Z96651 Presence of right artificial knee joint: Secondary | ICD-10-CM

## 2019-05-27 DIAGNOSIS — M6281 Muscle weakness (generalized): Secondary | ICD-10-CM

## 2019-05-27 DIAGNOSIS — M25561 Pain in right knee: Secondary | ICD-10-CM | POA: Diagnosis not present

## 2019-05-28 NOTE — Therapy (Signed)
[place] [facility] [address] [place], [place], [address] Phone: [phone]   Fax:  [phone]  Physical Therapy Treatment  Patient Details  Name: Samantha Mason MRN: [id number] Date of Birth: 06-Nov-1950 Referring Provider (PT): Dr. Marry Guan   Encounter Date: 05/25/2019  PT End of Session - 05/28/19 1701    Visit Number  3    Number of Visits  8    Date for PT Re-Evaluation  06/15/19    Authorization - Visit Number  3    Authorization - Number of Visits  10    PT Start Time  1112    PT Stop Time  1202    PT Time Calculation (min)  50 min    Activity Tolerance  Patient tolerated treatment well;Patient limited by pain    Behavior During Therapy  [facility] for tasks assessed/performed       Past Medical History:  Diagnosis Date  . Acute bronchitis   . Anemia   . Anxiety and depression   . Arthritis   . Asthma   . B12 deficiency 03/14/2017  . Chest pain 07/02/2018  . COPD (chronic obstructive pulmonary disease) ([place]) 07/24/2018  . Family history of blood clots 07/16/2017  . Irritable bowel syndrome   . Malignant hypertension   . Obesity   . OSA (obstructive sleep apnea)    uses cpap  . Pneumonia 2017  . Restless leg syndrome   . Stroke [facility]) 1995   mild    Past Surgical History:  Procedure Laterality Date  . CHOLECYSTECTOMY    . COLONOSCOPY WITH PROPOFOL N/A 03/31/2018   Procedure: COLONOSCOPY WITH PROPOFOL;  Surgeon: Lucilla Lame, MD;  Location: [facility] ENDOSCOPY;  Service: Endoscopy;  Laterality: N/A;  . JOINT REPLACEMENT  2014  . KNEE ARTHROPLASTY Right 04/30/2019   Procedure: COMPUTER ASSISTED TOTAL KNEE ARTHROPLASTY;  Surgeon: Dereck Leep, MD;  Location: [facility] ORS;  Service: Orthopedics;  Laterality: Right;  . saliva gland removal  1998  . TOTAL KNEE ARTHROPLASTY Left 2014  . TUBAL LIGATION Bilateral     There were no vitals filed for this visit.  Subjective Assessment - 05/28/19 0914    Subjective  Pt. reports  swelling in knee is doing okay today.  Pt. had staple removed at MD office.  Pt. wearing TED hose and shorts today.    Patient is accompained by:  Family member    Pertinent History  Pt. known to PT clinic.  See medical chart.    Limitations  Standing;Walking;House hold activities    Patient Stated Goals  Increase R knee ROM/ strength to improve pain-free mobility    Currently in Pain?  Yes    Pain Score  4     Pain Location  Knee    Pain Orientation  Right    Pain Descriptors / Indicators  Tightness    Pain Type  Acute pain;Surgical pain       There.ex.:  Nustep L3 10 min. B UE/LE  Supine Quad Set - 10 reps - 3 sets - 3 s hold Supine Heel Slides - 15 reps - 3 sets - 5 s hold  Mini Squat with Counter Support - 10 reps - 3 sets  Standing March with Counter Support - 10 reps - 3 sets  Standing Hip Abduction with Counter Support - 10 reps - 3 sets  Standing Hamstring Stretch with Step - 3 reps - 1 sets - 30 s hold  Standing Knee Flexion Stretch  on Step - 3 reps - 1 sets - 30 s hold   Manual tx.:  Supine R knee AA/PROM flexion 8x with static holds Supine patellar mobs. (all planes) Discussed scar massage    PT Education - 05/28/19 1700    Education Details  See HEP    Person(s) Educated  Patient;Spouse    Methods  Explanation;Demonstration;Handout    Comprehension  Verbalized understanding;Returned demonstration          PT Long Term Goals - 05/19/19 1302      PT LONG TERM GOAL #1   Title  Pt. will increase FOTO to 53 to improve functional mobility.    Baseline  Initial FOTO: 31    Time  4    Period  Weeks    Status  New    Target Date  06/15/19      PT LONG TERM GOAL #2   Title  Pt. will increase R knee AROM (0 to >110 deg.) to improve standing/walking.    Baseline  L knee AROM: 0 to 111 deg. R knee AROM: -4 to 84 deg. R knee ROM flexion 88 deg.    Time  4    Period  Weeks    Status  New    Target Date  06/15/19      PT LONG TERM GOAL #3   Title  Pt.  independent with HEP to increase LE strength 1/2 muscle grade to improve standing tolerance/ return to work.    Baseline  L LE strength grossly 5/5 MMT except hip flexion/ abduction 4+/5 MMT. R LE strength grossly 4+/5 MMT except hip flexion 4/5 MMT    Time  4    Period  Weeks    Status  New    Target Date  06/15/19      PT LONG TERM GOAL #4   Title  Pt. will ambulate with consistent 2-point gait pattern with use of SPC on inside/ outside surfaces to improve mobility.    Baseline  Moderate R antalgic gait pattern with use of RW    Time  4    Period  Weeks    Status  New    Target Date  06/15/19        Pt. remains limited with R knee flexion in supine position to around 98 deg. Pt. tolerates 30 second static holds during R knee flexion before needing a rest break. Pain in R distal quad/ knee joint resolves quickly in extended posture. Pt. continues to benefit from use of SPC with all aspects of standing/ gait. Several small scabs present over incision (healing well).       Patient will benefit from skilled therapeutic intervention in order to improve the following deficits and impairments:  Abnormal gait, Decreased balance, Decreased endurance, Decreased mobility, Difficulty walking, Hypomobility, Obesity, Improper body mechanics, Decreased range of motion, Decreased activity tolerance, Decreased strength, Impaired flexibility, Postural dysfunction, Pain  Visit Diagnosis: Status post total right knee replacement  Joint stiffness of knee, right  Muscle weakness (generalized)  Gait difficulty  Acute pain of right knee     Problem List Patient Active Problem List   Diagnosis Date Noted  . Total knee replacement status 04/30/2019  . Primary osteoarthritis of right knee 02/13/2019  . S/P total knee arthroplasty, left 02/13/2019  . Aortic atherosclerosis ([place]) 08/01/2018  . B12 deficiency anemia 07/07/2018  . Benign neoplasm of ascending colon   . Positive colorectal cancer  screening using Cologuard test 03/13/2018  .  Severe persistent asthma 02/13/2018  . Vitamin D deficiency 03/17/2017  . Prediabetes 03/14/2017  . Microcytic anemia 03/14/2017  . IBS (irritable bowel syndrome) 03/12/2017  . Allergic rhinitis 03/12/2017  . Restless leg syndrome 03/12/2017  . Acid reflux 10/16/2016  . Essential hypertension 10/16/2016  . OSA on CPAP 12/08/2014  . BMI 50.0-59.9, adult ([place]) 05/31/2014   Pura Spice, PT, DPT # [phone] 05/28/2019, 5:06 PM  [place] [facility] [address] [place], [place], [address] Phone: [phone]   Fax:  [phone]  Name: LUANNA WEESNER MRN: [id number] Date of Birth: 11-Oct-1950

## 2019-05-29 ENCOUNTER — Encounter: Payer: Self-pay | Admitting: Physical Therapy

## 2019-05-29 NOTE — Therapy (Signed)
[name] [address]. [place], [place], [address] Phone: [phone]   Fax:  [phone]  Physical Therapy Treatment  Patient Details  Name: Samantha Mason MRN: [id number] Date of Birth: 07-19-1950 Referring Provider (PT): Dr. Marry Guan   Encounter Date: 05/27/2019  PT End of Session - 05/29/19 1122    Visit Number  4    Number of Visits  8    Date for PT Re-Evaluation  06/15/19    Authorization - Visit Number  4    Authorization - Number of Visits  10    PT Start Time  1110    PT Stop Time  1202    PT Time Calculation (min)  52 min    Activity Tolerance  Patient tolerated treatment well;Patient limited by pain    Behavior During Therapy  [facility] for tasks assessed/performed       Past Medical History:  Diagnosis Date  . Acute bronchitis   . Anemia   . Anxiety and depression   . Arthritis   . Asthma   . B12 deficiency 03/14/2017  . Chest pain 07/02/2018  . COPD (chronic obstructive pulmonary disease) ([place]) 07/24/2018  . Family history of blood clots 07/16/2017  . Irritable bowel syndrome   . Malignant hypertension   . Obesity   . OSA (obstructive sleep apnea)    uses cpap  . Pneumonia 2017  . Restless leg syndrome   . Stroke [facility]) 1995   mild    Past Surgical History:  Procedure Laterality Date  . CHOLECYSTECTOMY    . COLONOSCOPY WITH PROPOFOL N/A 03/31/2018   Procedure: COLONOSCOPY WITH PROPOFOL;  Surgeon: Lucilla Lame, MD;  Location: [facility] ENDOSCOPY;  Service: Endoscopy;  Laterality: N/A;  . JOINT REPLACEMENT  2014  . KNEE ARTHROPLASTY Right 04/30/2019   Procedure: COMPUTER ASSISTED TOTAL KNEE ARTHROPLASTY;  Surgeon: Dereck Leep, MD;  Location: [facility] ORS;  Service: Orthopedics;  Laterality: Right;  . saliva gland removal  1998  . TOTAL KNEE ARTHROPLASTY Left 2014  . TUBAL LIGATION Bilateral     There were no vitals filed for this visit.  Subjective Assessment - 05/29/19 1120    Subjective  Pt. states she  feels stiff in R knee this morning.  Pt. entered PT with use of SPC and TED hose.  Pt. has been using massage cream on incision and order scar massage cream.    Patient is accompained by:  Family member    Pertinent History  Pt. known to PT clinic.  See medical chart.    Limitations  Standing;Walking;House hold activities    Patient Stated Goals  Increase R knee ROM/ strength to improve pain-free mobility    Currently in Pain?  Yes    Pain Score  4     Pain Location  Knee    Pain Orientation  Right    Pain Descriptors / Indicators  Tightness;Aching         There.ex.:  Nustep seat 7 L3 10+ min. B UE/LE (focus on maintaining R knee midline/ flexion) //-bar walking: forward/ backwards/ lateral with min. To no UE assist 5x each Step ups/ lunges at stairs 10x2.  B UE assist required.   Reviewed standing there.ex/ HEP Supine R SLR/ marching/ quad sets (manual feedback)  Manual tx.:  Supine patellar mobs. (sup/inf.) 5x each (grade III) STM to R distal quad/ hamstring 4 min. Scar tissue massage with instruction on proper technique Supine R knee AA/PROM flexion 7x with  static holds (100 deg.)- pain limited.     PT Long Term Goals - 05/19/19 1302      PT LONG TERM GOAL #1   Title  Pt. will increase FOTO to 53 to improve functional mobility.    Baseline  Initial FOTO: 31    Time  4    Period  Weeks    Status  New    Target Date  06/15/19      PT LONG TERM GOAL #2   Title  Pt. will increase R knee AROM (0 to >110 deg.) to improve standing/walking.    Baseline  L knee AROM: 0 to 111 deg. R knee AROM: -4 to 84 deg. R knee ROM flexion 88 deg.    Time  4    Period  Weeks    Status  New    Target Date  06/15/19      PT LONG TERM GOAL #3   Title  Pt. independent with HEP to increase LE strength 1/2 muscle grade to improve standing tolerance/ return to work.    Baseline  L LE strength grossly 5/5 MMT except hip flexion/ abduction 4+/5 MMT. R LE strength grossly 4+/5 MMT except hip  flexion 4/5 MMT    Time  4    Period  Weeks    Status  New    Target Date  06/15/19      PT LONG TERM GOAL #4   Title  Pt. will ambulate with consistent 2-point gait pattern with use of SPC on inside/ outside surfaces to improve mobility.    Baseline  Moderate R antalgic gait pattern with use of RW    Time  4    Period  Weeks    Status  New    Target Date  06/15/19            Plan - 05/29/19 1123    Clinical Impression Statement  R knee flexion is slowly progressing but remains pain limited/ stiff in supine posture.  Pt. ambulates with more consistent step pattern/ heel strike in clinic with use of SPC.  Pt. is able to ambulate without use of SPC but gait pattern more antalgic/ limited knee flexion.  Pt. fatigues quickly with standing ther.ex. and reports difficulty breathing with use of mask.  Several short seated rest breaks taken t/o tx. session.    Stability/Clinical Decision Making  Evolving/Moderate complexity    Clinical Decision Making  Moderate    Rehab Potential  Good    PT Frequency  2x / week    PT Duration  4 weeks    PT Treatment/Interventions  ADLs/Self Care Home Management;Aquatic Therapy;Cryotherapy;Moist Heat;Electrical Stimulation;Gait training;Stair training;Functional mobility training;Neuromuscular re-education;Balance training;Therapeutic exercise;Therapeutic activities;Patient/family education;Manual techniques;Passive range of motion    PT Next Visit Plan  Progress R knee flexion    PT Home Exercise Plan  Access Code: [email or web address]       Patient will benefit from skilled therapeutic intervention in order to improve the following deficits and impairments:  Abnormal gait, Decreased balance, Decreased endurance, Decreased mobility, Difficulty walking, Hypomobility, Obesity, Improper body mechanics, Decreased range of motion, Decreased activity tolerance, Decreased strength, Impaired flexibility, Postural dysfunction, Pain  Visit Diagnosis: Status post total right  knee replacement  Joint stiffness of knee, right  Muscle weakness (generalized)  Gait difficulty  Acute pain of right knee     Problem List Patient Active Problem List   Diagnosis Date Noted  . Total knee replacement status 04/30/2019  . Primary  osteoarthritis of right knee 02/13/2019  . S/P total knee arthroplasty, left 02/13/2019  . Aortic atherosclerosis ([place]) 08/01/2018  . B12 deficiency anemia 07/07/2018  . Benign neoplasm of ascending colon   . Positive colorectal cancer screening using Cologuard test 03/13/2018  . Severe persistent asthma 02/13/2018  . Vitamin D deficiency 03/17/2017  . Prediabetes 03/14/2017  . Microcytic anemia 03/14/2017  . IBS (irritable bowel syndrome) 03/12/2017  . Allergic rhinitis 03/12/2017  . Restless leg syndrome 03/12/2017  . Acid reflux 10/16/2016  . Essential hypertension 10/16/2016  . OSA on CPAP 12/08/2014  . BMI 50.0-59.9, adult ([place]) 05/31/2014   Pura Spice, PT, DPT # [phone] 05/29/2019, 11:32 AM  [facility] [address] [place], [place], [address] Phone: [phone]   Fax:  [phone]  Name: Samantha Mason MRN: [id number] Date of Birth: 1950/10/21

## 2019-06-01 ENCOUNTER — Encounter: Payer: 59 | Admitting: Physical Therapy

## 2019-06-03 ENCOUNTER — Ambulatory Visit: Payer: 59 | Admitting: Physical Therapy

## 2019-06-08 ENCOUNTER — Encounter: Payer: 59 | Admitting: Physical Therapy

## 2019-06-10 ENCOUNTER — Ambulatory Visit: Payer: 59 | Admitting: Physical Therapy

## 2019-06-14 ENCOUNTER — Telehealth: Payer: Self-pay

## 2019-06-14 ENCOUNTER — Other Ambulatory Visit: Payer: Self-pay | Admitting: Internal Medicine

## 2019-06-14 ENCOUNTER — Telehealth: Payer: Self-pay | Admitting: Internal Medicine

## 2019-06-14 DIAGNOSIS — I10 Essential (primary) hypertension: Secondary | ICD-10-CM

## 2019-06-14 MED ORDER — PROMETHAZINE HCL 25 MG PO TABS: 25.0000 mg | ORAL_TABLET | Freq: Four times a day (QID) | ORAL | 0 refills | Status: DC | PRN

## 2019-06-14 NOTE — Telephone Encounter (Signed)
Spoke with pt- see telephone message from 06/14/19.

## 2019-06-14 NOTE — Telephone Encounter (Signed)
Patient is having headaches, vomiting, stomach irritability.

## 2019-06-14 NOTE — Telephone Encounter (Signed)
Pt said she takes tramadol PRN since she had a knee replacement 04/30/19. She said she believes this has flared up her gastritis.   Having nausea, vomiting about 3x in 24 hours, and stomach pains. No fever or SOB.  Wants stronger phenergan called into [facility].   Will try this for the next 24 hours and if no better then she will go to UC.

## 2019-06-15 ENCOUNTER — Encounter: Payer: 59 | Admitting: Physical Therapy

## 2019-06-17 ENCOUNTER — Ambulatory Visit: Payer: 59 | Admitting: Physical Therapy

## 2019-06-22 ENCOUNTER — Other Ambulatory Visit: Payer: Self-pay

## 2019-06-22 ENCOUNTER — Encounter: Payer: Self-pay | Admitting: Physical Therapy

## 2019-06-22 ENCOUNTER — Ambulatory Visit: Payer: 59 | Attending: Orthopedic Surgery | Admitting: Physical Therapy

## 2019-06-22 DIAGNOSIS — M25561 Pain in right knee: Secondary | ICD-10-CM | POA: Insufficient documentation

## 2019-06-22 DIAGNOSIS — R269 Unspecified abnormalities of gait and mobility: Secondary | ICD-10-CM | POA: Diagnosis not present

## 2019-06-22 DIAGNOSIS — Z96651 Presence of right artificial knee joint: Secondary | ICD-10-CM | POA: Diagnosis not present

## 2019-06-22 DIAGNOSIS — M25661 Stiffness of right knee, not elsewhere classified: Secondary | ICD-10-CM | POA: Diagnosis not present

## 2019-06-22 DIAGNOSIS — M6281 Muscle weakness (generalized): Secondary | ICD-10-CM | POA: Diagnosis not present

## 2019-06-22 DIAGNOSIS — M1711 Unilateral primary osteoarthritis, right knee: Secondary | ICD-10-CM | POA: Diagnosis not present

## 2019-06-22 NOTE — Therapy (Signed)
[facility] [address]. [place], [place], [address] Phone: [phone]   Fax:  [phone]  Physical Therapy Treatment  Patient Details  Name: Samantha Mason MRN: [id number] Date of Birth: 10-20-1950 Referring Provider (PT): Dr. Marry Guan   Encounter Date: 06/22/2019  PT End of Session - 06/22/19 1323    Visit Number  5    Number of Visits  13    Date for PT Re-Evaluation  07/20/19    Authorization - Visit Number  1    Authorization - Number of Visits  10    PT Start Time  1114    PT Stop Time  1203    PT Time Calculation (min)  49 min    Activity Tolerance  Patient tolerated treatment well;Patient limited by pain    Behavior During Therapy  [facility] for tasks assessed/performed       Past Medical History:  Diagnosis Date  . Acute bronchitis   . Anemia   . Anxiety and depression   . Arthritis   . Asthma   . B12 deficiency 03/14/2017  . Chest pain 07/02/2018  . COPD (chronic obstructive pulmonary disease) ([place]) 07/24/2018  . Family history of blood clots 07/16/2017  . Irritable bowel syndrome   . Malignant hypertension   . Obesity   . OSA (obstructive sleep apnea)    uses cpap  . Pneumonia 2017  . Restless leg syndrome   . Stroke [facility]) 1995   mild    Past Surgical History:  Procedure Laterality Date  . CHOLECYSTECTOMY    . COLONOSCOPY WITH PROPOFOL N/A 03/31/2018   Procedure: COLONOSCOPY WITH PROPOFOL;  Surgeon: Lucilla Lame, MD;  Location: [facility] ENDOSCOPY;  Service: Endoscopy;  Laterality: N/A;  . JOINT REPLACEMENT  2014  . KNEE ARTHROPLASTY Right 04/30/2019   Procedure: COMPUTER ASSISTED TOTAL KNEE ARTHROPLASTY;  Surgeon: Dereck Leep, MD;  Location: [facility] ORS;  Service: Orthopedics;  Laterality: Right;  . saliva gland removal  1998  . TOTAL KNEE ARTHROPLASTY Left 2014  . TUBAL LIGATION Bilateral     There were no vitals filed for this visit.  Subjective Assessment - 06/22/19 1321    Subjective  Pt. returned to  PT today after cancelling last 3 weeks of treatment sessions due to diagnosis of gastritis.  Pt. reports she has been really inactive and sleeping a lot due to nausea/ vomiting.  Pt. states gastritis was due to taking Tramadol.  Pt. reports 1/10 R knee pain currently at rest.    Patient is accompained by:  Family member    Pertinent History  Pt. known to PT clinic.  See medical chart.    Limitations  Standing;Walking;House hold activities    Patient Stated Goals  Increase R knee ROM/ strength to improve pain-free mobility    Currently in Pain?  Yes    Pain Score  1     Pain Location  Knee    Pain Orientation  Right    Pain Descriptors / Indicators  Aching    Pain Type  Chronic pain         [facility] PT Assessment - 06/22/19 0001      Assessment   Medical Diagnosis  s/p R TKA    Referring Provider (PT)  Dr. Marry Guan    Onset Date/Surgical Date  04/30/19    Next MD Visit  06/22/2019    Prior Therapy  yes, L TKA      Home Environment   Living  Environment  Private residence      Prior Function   Level of Independence  Independent      Cognition   Overall Cognitive Status  Within Functional Limits for tasks assessed        R knee pain 1/10, pt. Still has difficulty with R knee pain at night (8/10)- unable to sleep on back and prefers to sleep on R side.      There.ex.:  Nustep seat 8-7 L4 10+ min. B UE/LE (focus on maintaining R knee midline/ flexion) Step ups (step to/ recip.) at stairs 6x2.  B UE assist required.   Supine R SLR/ marching/ quad sets (manual feedback) Discussed HEP (moderate deconditioning noted)  Manual tx.:  Supine patellar mobs. (sup/inf.) 5x each (grade III) STM to R distal quad/ hamstring 4 min. Supine R knee AA/PROM flexion 8x with static holds (101 deg.)- pain limited.    PT Long Term Goals - 06/22/19 1148      PT LONG TERM GOAL #1   Title  Pt. will increase FOTO to 53 to improve functional mobility.    Baseline  Initial FOTO: 31.  2/2: 47  (marked increase).    Time  4    Period  Weeks    Status  Partially Met    Target Date  07/20/19      PT LONG TERM GOAL #2   Title  Pt. will increase R knee AROM (0 to >110 deg.) to improve standing/walking.    Baseline  L knee AROM: 0 to 111 deg. R knee AROM: -4 to 84 deg. R knee ROM flexion 88 deg.  2/2: R knee 0 to 101 deg. after manual stretches.    Time  4    Period  Weeks    Status  Partially Met    Target Date  07/20/19      PT LONG TERM GOAL #3   Title  Pt. independent with HEP to increase LE strength 1/2 muscle grade to improve standing tolerance/ return to work.    Baseline  L LE strength grossly 5/5 MMT except hip flexion/ abduction 4+/5 MMT. R LE strength grossly 4+/5 MMT except hip flexion 4/5 MMT.  2/2: B hip flexion 4/5 MMT and B quad/hamstring 5/5 MMT.    Time  4    Period  Weeks    Status  Partially Met    Target Date  07/20/19      PT LONG TERM GOAL #4   Title  Pt. will ambulate with consistent 2-point gait pattern with use of SPC on inside/ outside surfaces to improve mobility.    Baseline  Moderate R antalgic gait pattern with use of RW    Time  4    Period  Weeks    Status  Partially Met    Target Date  07/20/19            Plan - 06/22/19 1324    Clinical Impression Statement  Pt. presents to PT clinic with reports of moderate fatigue/ deconditioning due to being significantly inactive over past several weeks.  Pt. entered PT with R antalgic gait (moderate R hip ER/ limited R knee flexion/ heel strike with lateral trunk lean) without use of SPC.  Pt. continues to benefit from use of SPC with outside walking for safety and to correct gait pattern.  R knee AROM reamins limited (0 to 101 deg.) after manual stretches/ manual techniques to R distal quad and hamstring.  Good incision healing and  PT educated pt. on importance of scar massage.  B LE muscle strength grossly 5/5 MMT except hip flexion 4/5 MMT (no increase pain).  Pt. easily fatigued during PT tx.  session with several rest breaks reported.  Pt. has persistent cough noted during tx. session.  Pt. will benefit from increase skilled PT to increase hip strengthening/ R knee AROM to improve pain-free gait/ mobility/overall conditioning.    Stability/Clinical Decision Making  Evolving/Moderate complexity    Clinical Decision Making  Moderate    Rehab Potential  Good    PT Frequency  2x / week    PT Duration  4 weeks    PT Treatment/Interventions  ADLs/Self Care Home Management;Aquatic Therapy;Cryotherapy;Moist Heat;Electrical Stimulation;Gait training;Stair training;Functional mobility training;Neuromuscular re-education;Balance training;Therapeutic exercise;Therapeutic activities;Patient/family education;Manual techniques;Passive range of motion    PT Next Visit Plan  Discussed MD appt./ Increase R knee flexion/ LE strengthening.    PT Home Exercise Plan  Access Code: [email or web address]       Patient will benefit from skilled therapeutic intervention in order to improve the following deficits and impairments:  Abnormal gait, Decreased balance, Decreased endurance, Decreased mobility, Difficulty walking, Hypomobility, Obesity, Improper body mechanics, Decreased range of motion, Decreased activity tolerance, Decreased strength, Impaired flexibility, Postural dysfunction, Pain  Visit Diagnosis: Status post total right knee replacement  Joint stiffness of knee, right  Muscle weakness (generalized)  Gait difficulty  Acute pain of right knee     Problem List Patient Active Problem List   Diagnosis Date Noted  . Total knee replacement status 04/30/2019  . Primary osteoarthritis of right knee 02/13/2019  . S/P total knee arthroplasty, left 02/13/2019  . Aortic atherosclerosis ([place]) 08/01/2018  . B12 deficiency anemia 07/07/2018  . Benign neoplasm of ascending colon   . Positive colorectal cancer screening using Cologuard test 03/13/2018  . Severe persistent asthma 02/13/2018  . Vitamin D  deficiency 03/17/2017  . Prediabetes 03/14/2017  . Microcytic anemia 03/14/2017  . IBS (irritable bowel syndrome) 03/12/2017  . Allergic rhinitis 03/12/2017  . Restless leg syndrome 03/12/2017  . Acid reflux 10/16/2016  . Essential hypertension 10/16/2016  . OSA on CPAP 12/08/2014  . BMI 50.0-59.9, adult ([place]) 05/31/2014   Pura Spice, PT, DPT # [phone] 06/22/2019, 1:34 PM  [facility] [address] [place], [place], [address] Phone: [phone]   Fax:  [phone]  Name: Samantha Mason MRN: [id number] Date of Birth: 24-Aug-1950

## 2019-06-24 ENCOUNTER — Encounter: Payer: Self-pay | Admitting: Physical Therapy

## 2019-06-24 ENCOUNTER — Other Ambulatory Visit: Payer: Self-pay

## 2019-06-24 ENCOUNTER — Ambulatory Visit: Payer: 59 | Admitting: Physical Therapy

## 2019-06-24 DIAGNOSIS — M25661 Stiffness of right knee, not elsewhere classified: Secondary | ICD-10-CM

## 2019-06-24 DIAGNOSIS — M25561 Pain in right knee: Secondary | ICD-10-CM

## 2019-06-24 DIAGNOSIS — M6281 Muscle weakness (generalized): Secondary | ICD-10-CM

## 2019-06-24 DIAGNOSIS — Z96651 Presence of right artificial knee joint: Secondary | ICD-10-CM

## 2019-06-24 DIAGNOSIS — R269 Unspecified abnormalities of gait and mobility: Secondary | ICD-10-CM

## 2019-06-24 NOTE — Therapy (Signed)
[facility] [address] [place], [place], [address] Phone: [phone]   Fax:  [phone]  Physical Therapy Treatment  Patient Details  Name: Samantha Mason MRN: [id number] Date of Birth: 12-02-1950 Referring Provider (PT): Dr. Marry Guan   Encounter Date: 06/24/2019  PT End of Session - 06/24/19 1442    Visit Number  6    Number of Visits  13    Date for PT Re-Evaluation  07/20/19    Authorization - Visit Number  2    Authorization - Number of Visits  10    PT Start Time  1115    PT Stop Time  1208    PT Time Calculation (min)  53 min    Activity Tolerance  Patient tolerated treatment well;Patient limited by pain    Behavior During Therapy  [facility] for tasks assessed/performed       Past Medical History:  Diagnosis Date  . Acute bronchitis   . Anemia   . Anxiety and depression   . Arthritis   . Asthma   . B12 deficiency 03/14/2017  . Chest pain 07/02/2018  . COPD (chronic obstructive pulmonary disease) ([place]) 07/24/2018  . Family history of blood clots 07/16/2017  . Irritable bowel syndrome   . Malignant hypertension   . Obesity   . OSA (obstructive sleep apnea)    uses cpap  . Pneumonia 2017  . Restless leg syndrome   . Stroke [facility]) 1995   mild    Past Surgical History:  Procedure Laterality Date  . CHOLECYSTECTOMY    . COLONOSCOPY WITH PROPOFOL N/A 03/31/2018   Procedure: COLONOSCOPY WITH PROPOFOL;  Surgeon: Lucilla Lame, MD;  Location: [facility] ENDOSCOPY;  Service: Endoscopy;  Laterality: N/A;  . JOINT REPLACEMENT  2014  . KNEE ARTHROPLASTY Right 04/30/2019   Procedure: COMPUTER ASSISTED TOTAL KNEE ARTHROPLASTY;  Surgeon: Dereck Leep, MD;  Location: [facility] ORS;  Service: Orthopedics;  Laterality: Right;  . saliva gland removal  1998  . TOTAL KNEE ARTHROPLASTY Left 2014  . TUBAL LIGATION Bilateral     There were no vitals filed for this visit.  Subjective Assessment - 06/24/19 1429    Subjective  Pt. reports no  pain in her R knee and had increased soreness in her R thigh since last PT session but the soreness decreases today prior to this session. Pt. reports her sleep gets better after putting a pillow between two legs to take some pressure off from L leg to R leg during R sidelying sleeping. Pt. reports she has to swing her R leg in order to get in and out of her car due to limited R knee flexion ROM.    Patient is accompained by:  --    Pertinent History  Pt. known to PT clinic.  See medical chart.    Limitations  Standing;Walking;House hold activities    Patient Stated Goals  Increase R knee ROM/ strength to improve pain-free mobility    Currently in Pain?  No/denies    Pain Score  0-No pain    Pain Location  Knee    Pain Orientation  Right          R knee pain 0/10, pt. Has increased sleep quality during night by having a pillow between two legs to relieve pressure put from L leg to R leg.   There.ex.:  Nustep seat 8 L4 10 min. B UE/LE (focus on maintaining R knee midline/ flexion) Step ups and down (  step through/ recip.) at stairs 4 laps x 4 stairs. Heavy B UE assist required. Supine R SLR 8 reps x 1 set, 10 reps x 1 set Quad sets, hold for 5 s 15 reps x 2 sets, tactile cueing for quad contraction Standing knee to chest on stair on R knee 5 s hold x 10 reps, verbal cueing for performing to the point tightness is felt  Manual tx.:  Supine patellar mobs. (sup/inf.) 1 min each (grade III) STM to R distal quad/ hamstring 4 min. Supine R knee AA/PROM flexion 10-15 s with static holds x 10 reps x 2 sets, pain limited.    PT Long Term Goals - 06/22/19 1148      PT LONG TERM GOAL #1   Title  Pt. will increase FOTO to 53 to improve functional mobility.    Baseline  Initial FOTO: 31.  2/2: 47 (marked increase).    Time  4    Period  Weeks    Status  Partially Met    Target Date  07/20/19      PT LONG TERM GOAL #2   Title  Pt. will increase R knee AROM (0 to >110 deg.) to improve  standing/walking.    Baseline  L knee AROM: 0 to 111 deg. R knee AROM: -4 to 84 deg. R knee ROM flexion 88 deg.  2/2: R knee 0 to 101 deg. after manual stretches.    Time  4    Period  Weeks    Status  Partially Met    Target Date  07/20/19      PT LONG TERM GOAL #3   Title  Pt. independent with HEP to increase LE strength 1/2 muscle grade to improve standing tolerance/ return to work.    Baseline  L LE strength grossly 5/5 MMT except hip flexion/ abduction 4+/5 MMT. R LE strength grossly 4+/5 MMT except hip flexion 4/5 MMT.  2/2: B hip flexion 4/5 MMT and B quad/hamstring 5/5 MMT.    Time  4    Period  Weeks    Status  Partially Met    Target Date  07/20/19      PT LONG TERM GOAL #4   Title  Pt. will ambulate with consistent 2-point gait pattern with use of SPC on inside/ outside surfaces to improve mobility.    Baseline  Moderate R antalgic gait pattern with use of RW    Time  4    Period  Weeks    Status  Partially Met    Target Date  07/20/19            Plan - 06/24/19 1443    Clinical Impression Statement  Pt. responds well to STM in distal quadriceps muscles and tendon with mild increased soreness. Pt. has increased pain and tightness during supine AAROM in knee flexion but pain decreases once off the knee flexed position. Pt. needs to use heavy UE assist with both hands hold on handles and has more difficulty and less confidence when going down stair than going up during stair training. Pt. has less control on R knee during quad eccentric contraction phase during stair training. Pt. is limited by deconditioning during stair training.    Stability/Clinical Decision Making  Evolving/Moderate complexity    Clinical Decision Making  Moderate    Rehab Potential  Good    PT Frequency  2x / week    PT Duration  4 weeks    PT Treatment/Interventions  ADLs/Self Care Home Management;Aquatic Therapy;Cryotherapy;Moist Heat;Electrical Stimulation;Gait training;Stair training;Functional  mobility training;Neuromuscular re-education;Balance training;Therapeutic exercise;Therapeutic activities;Patient/family education;Manual techniques;Passive range of motion    PT Next Visit Plan  Discussed MD appt./ Increase R knee flexion/ LE strengthening/conditioning    PT Home Exercise Plan  Access Code: [email or web address]    Consulted and Agree with Plan of Care  Patient       Patient will benefit from skilled therapeutic intervention in order to improve the following deficits and impairments:  Abnormal gait, Decreased balance, Decreased endurance, Decreased mobility, Difficulty walking, Hypomobility, Obesity, Improper body mechanics, Decreased range of motion, Decreased activity tolerance, Decreased strength, Impaired flexibility, Postural dysfunction, Pain  Visit Diagnosis: Status post total right knee replacement  Joint stiffness of knee, right  Muscle weakness (generalized)  Gait difficulty  Acute pain of right knee     Problem List Patient Active Problem List   Diagnosis Date Noted  . Total knee replacement status 04/30/2019  . Primary osteoarthritis of right knee 02/13/2019  . S/P total knee arthroplasty, left 02/13/2019  . Aortic atherosclerosis ([place]) 08/01/2018  . B12 deficiency anemia 07/07/2018  . Benign neoplasm of ascending colon   . Positive colorectal cancer screening using Cologuard test 03/13/2018  . Severe persistent asthma 02/13/2018  . Vitamin D deficiency 03/17/2017  . Prediabetes 03/14/2017  . Microcytic anemia 03/14/2017  . IBS (irritable bowel syndrome) 03/12/2017  . Allergic rhinitis 03/12/2017  . Restless leg syndrome 03/12/2017  . Acid reflux 10/16/2016  . Essential hypertension 10/16/2016  . OSA on CPAP 12/08/2014  . BMI 50.0-59.9, adult ([place]) 05/31/2014   Pura Spice, PT, DPT # [id number] Mittie Bodo, SPT 06/25/2019, 1:26 PM  [facility] [address] [place], [place], [address] Phone: [phone]    Fax:  [phone]  Name: Samantha Mason MRN: [id number] Date of Birth: 01/28/1951

## 2019-06-25 DIAGNOSIS — Z471 Aftercare following joint replacement surgery: Secondary | ICD-10-CM | POA: Diagnosis not present

## 2019-06-30 ENCOUNTER — Encounter: Payer: Self-pay | Admitting: Physical Therapy

## 2019-06-30 ENCOUNTER — Other Ambulatory Visit: Payer: Self-pay

## 2019-06-30 ENCOUNTER — Ambulatory Visit: Payer: 59 | Admitting: Physical Therapy

## 2019-06-30 DIAGNOSIS — M6281 Muscle weakness (generalized): Secondary | ICD-10-CM | POA: Diagnosis not present

## 2019-06-30 DIAGNOSIS — M25561 Pain in right knee: Secondary | ICD-10-CM | POA: Diagnosis not present

## 2019-06-30 DIAGNOSIS — M25661 Stiffness of right knee, not elsewhere classified: Secondary | ICD-10-CM | POA: Diagnosis not present

## 2019-06-30 DIAGNOSIS — Z96651 Presence of right artificial knee joint: Secondary | ICD-10-CM | POA: Diagnosis not present

## 2019-06-30 DIAGNOSIS — R269 Unspecified abnormalities of gait and mobility: Secondary | ICD-10-CM

## 2019-06-30 NOTE — Therapy (Signed)
[place] [facility] [address] [place], [place], [address] Phone: [phone]   Fax:  [phone]  Physical Therapy Treatment  Patient Details  Name: Samantha Mason MRN: [id number] Date of Birth: 12/30/1950 Referring Provider (PT): Dr. Marry Guan   Encounter Date: 06/30/2019  PT End of Session - 06/30/19 1504    Visit Number  7    Number of Visits  13    Date for PT Re-Evaluation  07/20/19    Authorization - Visit Number  3    Authorization - Number of Visits  10    PT Start Time  1400    PT Stop Time  1447    PT Time Calculation (min)  47 min    Activity Tolerance  Patient tolerated treatment well;Patient limited by pain    Behavior During Therapy  [facility] for tasks assessed/performed       Past Medical History:  Diagnosis Date  . Acute bronchitis   . Anemia   . Anxiety and depression   . Arthritis   . Asthma   . B12 deficiency 03/14/2017  . Chest pain 07/02/2018  . COPD (chronic obstructive pulmonary disease) ([place]) 07/24/2018  . Family history of blood clots 07/16/2017  . Irritable bowel syndrome   . Malignant hypertension   . Obesity   . OSA (obstructive sleep apnea)    uses cpap  . Pneumonia 2017  . Restless leg syndrome   . Stroke [facility]) 1995   mild    Past Surgical History:  Procedure Laterality Date  . CHOLECYSTECTOMY    . COLONOSCOPY WITH PROPOFOL N/A 03/31/2018   Procedure: COLONOSCOPY WITH PROPOFOL;  Surgeon: Lucilla Lame, MD;  Location: [facility] ENDOSCOPY;  Service: Endoscopy;  Laterality: N/A;  . JOINT REPLACEMENT  2014  . KNEE ARTHROPLASTY Right 04/30/2019   Procedure: COMPUTER ASSISTED TOTAL KNEE ARTHROPLASTY;  Surgeon: Dereck Leep, MD;  Location: [facility] ORS;  Service: Orthopedics;  Laterality: Right;  . saliva gland removal  1998  . TOTAL KNEE ARTHROPLASTY Left 2014  . TUBAL LIGATION Bilateral     There were no vitals filed for this visit.  Subjective Assessment - 06/30/19 1458    Subjective  Pt. reports  2/10 pain in R posterior knee when bending. Pt. reports difficulty getting in and out of the hot tub at her home and she has to get her L leg in or out first before R leg. Pt. reports increased stiffness in R knee after prolonged sitting or more than 15 min of driving.    Patient is accompained by:  Family member    Pertinent History  Pt. known to PT clinic.  See medical chart.    Limitations  Standing;Walking;House hold activities    Patient Stated Goals  Increase R knee ROM/ strength to improve pain-free mobility    Currently in Pain?  Yes    Pain Score  2     Pain Location  Knee    Pain Orientation  Right    Pain Descriptors / Indicators  Aching    Pain Type  Chronic pain;Surgical pain    Pain Onset  More than a month ago    Pain Frequency  Intermittent      Therex: Nustep S7-9 L 4 10 min Standing forward marching/backward walking x 4 laps  Standing hip abd/ extension/flexion 10 reps x 1 set for L, 10 reps x 2 sets for R, limited by R knee pain (6/10) when standing on R leg during  these therex Forward step over two small green hurdles and two large hurdles 3 lap/step over two small green hurdles and two yoga blocks 3 laps, limited by hip flexor weakness in R when stepping over large green hurdles Standing isometric hold 5 s x 8 reps into knee flexion on stair  Manual tx: Supine L knee AA/PROM 6x with holds (94 deg. Initial flexion/ 100 deg. After manual tx.) Reassessed scar mobility/ massage Seated MET for R knee flexion, isometric hold 5 s x 8 reps, verbal cueing for isometric holds       PT Long Term Goals - 06/22/19 1148      PT LONG TERM GOAL #1   Title  Pt. will increase FOTO to 53 to improve functional mobility.    Baseline  Initial FOTO: 31.  2/2: 47 (marked increase).    Time  4    Period  Weeks    Status  Partially Met    Target Date  07/20/19      PT LONG TERM GOAL #2   Title  Pt. will increase R knee AROM (0 to >110 deg.) to improve standing/walking.     Baseline  L knee AROM: 0 to 111 deg. R knee AROM: -4 to 84 deg. R knee ROM flexion 88 deg.  2/2: R knee 0 to 101 deg. after manual stretches.    Time  4    Period  Weeks    Status  Partially Met    Target Date  07/20/19      PT LONG TERM GOAL #3   Title  Pt. independent with HEP to increase LE strength 1/2 muscle grade to improve standing tolerance/ return to work.    Baseline  L LE strength grossly 5/5 MMT except hip flexion/ abduction 4+/5 MMT. R LE strength grossly 4+/5 MMT except hip flexion 4/5 MMT.  2/2: B hip flexion 4/5 MMT and B quad/hamstring 5/5 MMT.    Time  4    Period  Weeks    Status  Partially Met    Target Date  07/20/19      PT LONG TERM GOAL #4   Title  Pt. will ambulate with consistent 2-point gait pattern with use of SPC on inside/ outside surfaces to improve mobility.    Baseline  Moderate R antalgic gait pattern with use of RW    Time  4    Period  Weeks    Status  Partially Met    Target Date  07/20/19            Plan - 06/30/19 1703    Clinical Impression Statement  R knee flexion PROM: 100 deg. Pt. has increased R knee pain to 6/10 during R leg stance phase when performing standing hip abd/ext/flex within //-bars and is limited by pain by 10 reps of 1 set for each of therex when standing on R leg. The symptom decreases during rest breaks between therex. Pt. has difficulty to step over large green hurdles due to weakness in R hip flexor and is modified to step over small green hurdles instead. Pt. demonstrates increased passive knee flexion during MET.    Examination-Activity Limitations  Bathing;Stairs;Sleep;Squat;Lift;Toileting;Stand    Examination-Participation Restrictions  Driving    Stability/Clinical Decision Making  Evolving/Moderate complexity    Clinical Decision Making  Moderate    Rehab Potential  Good    PT Frequency  2x / week    PT Duration  4 weeks    PT Treatment/Interventions  ADLs/Self Care Home Management;Aquatic  Therapy;Cryotherapy;Moist Heat;Electrical Stimulation;Gait training;Stair training;Functional mobility training;Neuromuscular re-education;Balance training;Therapeutic exercise;Therapeutic activities;Patient/family education;Manual techniques;Passive range of motion    PT Next Visit Plan  Discussed MD appt./ Increase R knee flexion(MET, reassess knee flexion)/ LE strengthening/conditioning/increase standing activities    PT Home Exercise Plan  Access Code: [email or web address]    Consulted and Agree with Plan of Care  Patient       Patient will benefit from skilled therapeutic intervention in order to improve the following deficits and impairments:  Abnormal gait, Decreased balance, Decreased endurance, Decreased mobility, Difficulty walking, Hypomobility, Obesity, Improper body mechanics, Decreased range of motion, Decreased activity tolerance, Decreased strength, Impaired flexibility, Postural dysfunction, Pain  Visit Diagnosis: Status post total right knee replacement  Joint stiffness of knee, right  Muscle weakness (generalized)  Gait difficulty  Acute pain of right knee     Problem List Patient Active Problem List   Diagnosis Date Noted  . Total knee replacement status 04/30/2019  . Primary osteoarthritis of right knee 02/13/2019  . S/P total knee arthroplasty, left 02/13/2019  . Aortic atherosclerosis ([place]) 08/01/2018  . B12 deficiency anemia 07/07/2018  . Benign neoplasm of ascending colon   . Positive colorectal cancer screening using Cologuard test 03/13/2018  . Severe persistent asthma 02/13/2018  . Vitamin D deficiency 03/17/2017  . Prediabetes 03/14/2017  . Microcytic anemia 03/14/2017  . IBS (irritable bowel syndrome) 03/12/2017  . Allergic rhinitis 03/12/2017  . Restless leg syndrome 03/12/2017  . Acid reflux 10/16/2016  . Essential hypertension 10/16/2016  . OSA on CPAP 12/08/2014  . BMI 50.0-59.9, adult ([place]) 05/31/2014   Pura Spice, PT, DPT # [place],  SPT 07/01/2019, 1:58 PM  [place] [facility] [address]. [place], [place], [address] Phone: [phone]   Fax:  [phone]  Name: Samantha Mason MRN: [id number] Date of Birth: 07-12-1950

## 2019-07-01 ENCOUNTER — Other Ambulatory Visit: Payer: Self-pay | Admitting: Internal Medicine

## 2019-07-02 ENCOUNTER — Other Ambulatory Visit: Payer: Self-pay

## 2019-07-02 ENCOUNTER — Ambulatory Visit: Payer: 59 | Admitting: Physical Therapy

## 2019-07-02 ENCOUNTER — Encounter: Payer: Self-pay | Admitting: Physical Therapy

## 2019-07-02 DIAGNOSIS — R269 Unspecified abnormalities of gait and mobility: Secondary | ICD-10-CM

## 2019-07-02 DIAGNOSIS — M25661 Stiffness of right knee, not elsewhere classified: Secondary | ICD-10-CM | POA: Diagnosis not present

## 2019-07-02 DIAGNOSIS — M25561 Pain in right knee: Secondary | ICD-10-CM

## 2019-07-02 DIAGNOSIS — Z96651 Presence of right artificial knee joint: Secondary | ICD-10-CM

## 2019-07-02 DIAGNOSIS — M6281 Muscle weakness (generalized): Secondary | ICD-10-CM

## 2019-07-02 NOTE — Therapy (Signed)
[place] [facility] [address] [place], [place], [address] Phone: [phone]   Fax:  [phone]  Physical Therapy Treatment  Patient Details  Name: Samantha Mason MRN: [id number] Date of Birth: June 09, 1950 Referring Provider (PT): Dr. Marry Guan   Encounter Date: 07/02/2019  PT End of Session - 07/02/19 1302    Visit Number  8    Number of Visits  13    Date for PT Re-Evaluation  07/20/19    Authorization - Visit Number  4    Authorization - Number of Visits  10    PT Start Time  1302    PT Stop Time  1357    PT Time Calculation (min)  55 min    Activity Tolerance  Patient tolerated treatment well;Patient limited by pain    Behavior During Therapy  [facility] for tasks assessed/performed       Past Medical History:  Diagnosis Date  . Acute bronchitis   . Anemia   . Anxiety and depression   . Arthritis   . Asthma   . B12 deficiency 03/14/2017  . Chest pain 07/02/2018  . COPD (chronic obstructive pulmonary disease) ([place]) 07/24/2018  . Family history of blood clots 07/16/2017  . Irritable bowel syndrome   . Malignant hypertension   . Obesity   . OSA (obstructive sleep apnea)    uses cpap  . Pneumonia 2017  . Restless leg syndrome   . Stroke [facility]) 1995   mild    Past Surgical History:  Procedure Laterality Date  . CHOLECYSTECTOMY    . COLONOSCOPY WITH PROPOFOL N/A 03/31/2018   Procedure: COLONOSCOPY WITH PROPOFOL;  Surgeon: Lucilla Lame, MD;  Location: [facility] ENDOSCOPY;  Service: Endoscopy;  Laterality: N/A;  . JOINT REPLACEMENT  2014  . KNEE ARTHROPLASTY Right 04/30/2019   Procedure: COMPUTER ASSISTED TOTAL KNEE ARTHROPLASTY;  Surgeon: Dereck Leep, MD;  Location: [facility] ORS;  Service: Orthopedics;  Laterality: Right;  . saliva gland removal  1998  . TOTAL KNEE ARTHROPLASTY Left 2014  . TUBAL LIGATION Bilateral     There were no vitals filed for this visit.  Subjective Assessment - 07/02/19 1255    Subjective  Pt. reports  doing well and states she has no pain prior to PT tx. session.    Patient is accompained by:  Family member    Pertinent History  Pt. known to PT clinic.  See medical chart.    Limitations  Standing;Walking;House hold activities    Patient Stated Goals  Increase R knee ROM/ strength to improve pain-free mobility    Currently in Pain?  No/denies    Pain Onset  More than a month ago         Therex: Nustep S8-7 L4 10 min. B UE/LE.   Walking around PT clinic with cuing to increase hip flexion/ heel strike 2x. Seated LE ex. (4#): LAQ/ standing marching/ hip abduction/ extension/ heel raises 20x each.   Lateral walking in //-bars 3x (mirror feedback).   Step ups at stairs (no ankle wts.)- 12x (recip. Pattern).   Supine R hip/knee flexion manual isometric ex. 10x  Manual tx: Supine R knee AA/PROM 5x with holds. (R knee PROM flexion 110 deg.- marked increase). Supine patellar mobs. (all planes).  Distal R quad STM during knee flexion static holds.     PT Long Term Goals - 06/22/19 1148      PT LONG TERM GOAL #1   Title  Pt. will increase FOTO  to 53 to improve functional mobility.    Baseline  Initial FOTO: 31.  2/2: 47 (marked increase).    Time  4    Period  Weeks    Status  Partially Met    Target Date  07/20/19      PT LONG TERM GOAL #2   Title  Pt. will increase R knee AROM (0 to >110 deg.) to improve standing/walking.    Baseline  L knee AROM: 0 to 111 deg. R knee AROM: -4 to 84 deg. R knee ROM flexion 88 deg.  2/2: R knee 0 to 101 deg. after manual stretches.    Time  4    Period  Weeks    Status  Partially Met    Target Date  07/20/19      PT LONG TERM GOAL #3   Title  Pt. independent with HEP to increase LE strength 1/2 muscle grade to improve standing tolerance/ return to work.    Baseline  L LE strength grossly 5/5 MMT except hip flexion/ abduction 4+/5 MMT. R LE strength grossly 4+/5 MMT except hip flexion 4/5 MMT.  2/2: B hip flexion 4/5 MMT and B quad/hamstring 5/5  MMT.    Time  4    Period  Weeks    Status  Partially Met    Target Date  07/20/19      PT LONG TERM GOAL #4   Title  Pt. will ambulate with consistent 2-point gait pattern with use of SPC on inside/ outside surfaces to improve mobility.    Baseline  Moderate R antalgic gait pattern with use of RW    Time  4    Period  Weeks    Status  Partially Met    Target Date  07/20/19            Plan - 07/02/19 1302    Clinical Impression Statement  Pt. has marked increase in R knee PROM flexion (110 deg.) after manual tx./ stretches.  Pt. progressing well with standing ther.ex./ LE resisted ex.  Pt. requires only 2 short seated rest breaks during the tx. session.  Good incision healing.  Pt. continues to ambulate with increase lateral lean and PT instructed on step pattern/ hip flexion/ heel strike.    Examination-Activity Limitations  Bathing;Stairs;Sleep;Squat;Lift;Toileting;Stand    Examination-Participation Restrictions  Driving    Stability/Clinical Decision Making  Evolving/Moderate complexity    Clinical Decision Making  Moderate    Rehab Potential  Good    PT Frequency  2x / week    PT Duration  4 weeks    PT Treatment/Interventions  ADLs/Self Care Home Management;Aquatic Therapy;Cryotherapy;Moist Heat;Electrical Stimulation;Gait training;Stair training;Functional mobility training;Neuromuscular re-education;Balance training;Therapeutic exercise;Therapeutic activities;Patient/family education;Manual techniques;Passive range of motion    PT Next Visit Plan  Discussed MD appt./ Increase R knee flexion(MET, reassess knee flexion)/ LE strengthening/conditioning/increase standing activities    PT Home Exercise Plan  Access Code: [email or web address]    Consulted and Agree with Plan of Care  Patient       Patient will benefit from skilled therapeutic intervention in order to improve the following deficits and impairments:  Abnormal gait, Decreased balance, Decreased endurance, Decreased mobility,  Difficulty walking, Hypomobility, Obesity, Improper body mechanics, Decreased range of motion, Decreased activity tolerance, Decreased strength, Impaired flexibility, Postural dysfunction, Pain  Visit Diagnosis: Status post total right knee replacement  Joint stiffness of knee, right  Muscle weakness (generalized)  Gait difficulty  Acute pain of right knee  Problem List Patient Active Problem List   Diagnosis Date Noted  . Total knee replacement status 04/30/2019  . Primary osteoarthritis of right knee 02/13/2019  . S/P total knee arthroplasty, left 02/13/2019  . Aortic atherosclerosis ([place]) 08/01/2018  . B12 deficiency anemia 07/07/2018  . Benign neoplasm of ascending colon   . Positive colorectal cancer screening using Cologuard test 03/13/2018  . Severe persistent asthma 02/13/2018  . Vitamin D deficiency 03/17/2017  . Prediabetes 03/14/2017  . Microcytic anemia 03/14/2017  . IBS (irritable bowel syndrome) 03/12/2017  . Allergic rhinitis 03/12/2017  . Restless leg syndrome 03/12/2017  . Acid reflux 10/16/2016  . Essential hypertension 10/16/2016  . OSA on CPAP 12/08/2014  . BMI 50.0-59.9, adult ([place]) 05/31/2014   Pura Spice, PT, DPT # [id number] 07/02/2019, 1:58 PM  [place] [facility] [address] [place], [place], [address] Phone: [phone]   Fax:  [phone]  Name: Samantha Mason MRN: [id number] Date of Birth: Dec 07, 1950

## 2019-07-06 ENCOUNTER — Other Ambulatory Visit: Payer: Self-pay

## 2019-07-06 ENCOUNTER — Ambulatory Visit: Payer: 59 | Admitting: Physical Therapy

## 2019-07-06 DIAGNOSIS — R269 Unspecified abnormalities of gait and mobility: Secondary | ICD-10-CM

## 2019-07-06 DIAGNOSIS — M25661 Stiffness of right knee, not elsewhere classified: Secondary | ICD-10-CM

## 2019-07-06 DIAGNOSIS — Z96651 Presence of right artificial knee joint: Secondary | ICD-10-CM

## 2019-07-06 DIAGNOSIS — M6281 Muscle weakness (generalized): Secondary | ICD-10-CM | POA: Diagnosis not present

## 2019-07-06 DIAGNOSIS — M25561 Pain in right knee: Secondary | ICD-10-CM | POA: Diagnosis not present

## 2019-07-06 NOTE — Therapy (Signed)
[place] [facility] [address]. [place], [place], [address] Phone: [phone]   Fax:  [phone]  Physical Therapy Treatment  Patient Details  Name: Samantha Mason MRN: [id number] Date of Birth: [birth year] Referring Provider (PT): Dr. Marry Guan   Encounter Date: 07/06/2019  PT End of Session - 07/06/19 1509    Visit Number  9    Number of Visits  13    Date for PT Re-Evaluation  07/20/19    Authorization - Visit Number  5    Authorization - Number of Visits  10    PT Start Time  1330    PT Stop Time  1415    PT Time Calculation (min)  45 min    Activity Tolerance  Patient tolerated treatment well;Patient limited by pain    Behavior During Therapy  [facility] for tasks assessed/performed       Past Medical History:  Diagnosis Date  . Acute bronchitis   . Anemia   . Anxiety and depression   . Arthritis   . Asthma   . B12 deficiency 03/14/2017  . Chest pain 07/02/2018  . COPD (chronic obstructive pulmonary disease) ([place]) 07/24/2018  . Family history of blood clots 07/16/2017  . Irritable bowel syndrome   . Malignant hypertension   . Obesity   . OSA (obstructive sleep apnea)    uses cpap  . Pneumonia 2017  . Restless leg syndrome   . Stroke [facility]) 1995   mild    Past Surgical History:  Procedure Laterality Date  . CHOLECYSTECTOMY    . COLONOSCOPY WITH PROPOFOL N/A 03/31/2018   Procedure: COLONOSCOPY WITH PROPOFOL;  Surgeon: Lucilla Lame, MD;  Location: [facility] ENDOSCOPY;  Service: Endoscopy;  Laterality: N/A;  . JOINT REPLACEMENT  2014  . KNEE ARTHROPLASTY Right 04/30/2019   Procedure: COMPUTER ASSISTED TOTAL KNEE ARTHROPLASTY;  Surgeon: Dereck Leep, MD;  Location: [facility] ORS;  Service: Orthopedics;  Laterality: Right;  . saliva gland removal  1998  . TOTAL KNEE ARTHROPLASTY Left 2014  . TUBAL LIGATION Bilateral     There were no vitals filed for this visit.  Subjective Assessment - 07/06/19 1507    Subjective  Pt. reports  she is doing well at home but if she standing on R leg and put too much pressure on it she feels like the R knee is going to buckle.    Patient is accompained by:  Family member    Pertinent History  Pt. known to PT clinic.  See medical chart.    Limitations  Standing;Walking;House hold activities    Patient Stated Goals  Increase R knee ROM/ strength to improve pain-free mobility    Currently in Pain?  No/denies    Pain Onset  More than a month ago      Therex: Nustep S8-7 L4 10 min. B UE/LE.   Seated LE ex. (4#): LAQ hold for 5 s/ standing hip abduction/ extension/ 10 x each.   Step ups at stairs (no ankle wts.)- 3 laps (recip. Pattern) with heavy UE assist.   Standing on stairs lean trunk forward hold for 5 s x 10 reps to increase R knee flexion  Supine R hip/knee flexion manual isometric ex. 10x Supine SAQ with red bolster/ SLR 10x2 (fatigue)  Manual tx: Supine R knee AA/PROM 5x with holds. (R knee PROM flexion 110 deg.- marked increase). Supine patellar mobs. (all planes).  Distal R quad STM during knee flexion static holds  PT Long Term Goals - 06/22/19 1148      PT LONG TERM GOAL #1   Title  Pt. will increase FOTO to 53 to improve functional mobility.    Baseline  Initial FOTO: 31.  2/2: 47 (marked increase).    Time  4    Period  Weeks    Status  Partially Met    Target Date  07/20/19      PT LONG TERM GOAL #2   Title  Pt. will increase R knee AROM (0 to >110 deg.) to improve standing/walking.    Baseline  L knee AROM: 0 to 111 deg. R knee AROM: -4 to 84 deg. R knee ROM flexion 88 deg.  2/2: R knee 0 to 101 deg. after manual stretches.    Time  4    Period  Weeks    Status  Partially Met    Target Date  07/20/19      PT LONG TERM GOAL #3   Title  Pt. independent with HEP to increase LE strength 1/2 muscle grade to improve standing tolerance/ return to work.    Baseline  L LE strength grossly 5/5 MMT except hip flexion/ abduction 4+/5 MMT. R LE strength  grossly 4+/5 MMT except hip flexion 4/5 MMT.  2/2: B hip flexion 4/5 MMT and B quad/hamstring 5/5 MMT.    Time  4    Period  Weeks    Status  Partially Met    Target Date  07/20/19      PT LONG TERM GOAL #4   Title  Pt. will ambulate with consistent 2-point gait pattern with use of SPC on inside/ outside surfaces to improve mobility.    Baseline  Moderate R antalgic gait pattern with use of RW    Time  4    Period  Weeks    Status  Partially Met    Target Date  07/20/19            Plan - 07/06/19 1510    Clinical Impression Statement  Pt demonstrates less tolerate in standing therex when stand on R leg due to fear of R knee buckling. Pt. requires heavy UE assist on handle during stair negotiation with step through pattern. Pt. tolerates well with manual isometric tx/stretches.    Examination-Activity Limitations  Bathing;Stairs;Sleep;Squat;Lift;Toileting;Stand    Examination-Participation Restrictions  Driving    Stability/Clinical Decision Making  Evolving/Moderate complexity    Clinical Decision Making  Moderate    Rehab Potential  Good    PT Frequency  2x / week    PT Duration  4 weeks    PT Treatment/Interventions  ADLs/Self Care Home Management;Aquatic Therapy;Cryotherapy;Moist Heat;Electrical Stimulation;Gait training;Stair training;Functional mobility training;Neuromuscular re-education;Balance training;Therapeutic exercise;Therapeutic activities;Patient/family education;Manual techniques;Passive range of motion    PT Next Visit Plan  Discussed MD appt./ Increase R knee flexion(MET, reassess knee flexion)/ LE strengthening/conditioning/increase standing activities    PT Home Exercise Plan  Access Code: [email or web address]    Consulted and Agree with Plan of Care  Patient       Patient will benefit from skilled therapeutic intervention in order to improve the following deficits and impairments:  Abnormal gait, Decreased balance, Decreased endurance, Decreased mobility, Difficulty  walking, Hypomobility, Obesity, Improper body mechanics, Decreased range of motion, Decreased activity tolerance, Decreased strength, Impaired flexibility, Postural dysfunction, Pain  Visit Diagnosis: Status post total right knee replacement  Joint stiffness of knee, right  Muscle weakness (generalized)  Gait difficulty     Problem List  Patient Active Problem List   Diagnosis Date Noted  . Total knee replacement status 04/30/2019  . Primary osteoarthritis of right knee 02/13/2019  . S/P total knee arthroplasty, left 02/13/2019  . Aortic atherosclerosis ([place]) 08/01/2018  . B12 deficiency anemia 07/07/2018  . Benign neoplasm of ascending colon   . Positive colorectal cancer screening using Cologuard test 03/13/2018  . Severe persistent asthma 02/13/2018  . Vitamin D deficiency 03/17/2017  . Prediabetes 03/14/2017  . Microcytic anemia 03/14/2017  . IBS (irritable bowel syndrome) 03/12/2017  . Allergic rhinitis 03/12/2017  . Restless leg syndrome 03/12/2017  . Acid reflux 10/16/2016  . Essential hypertension 10/16/2016  . OSA on CPAP 12/08/2014  . BMI 50.0-59.9, adult ([place]) 05/31/2014   Pura Spice, PT, DPT # [place], SPT 07/06/2019, 3:14 PM  [place] [facility] [address] [place], [place], [address] Phone: [phone]   Fax:  [phone]  Name: Samantha Mason MRN: [id number] Date of Birth: 1951/04/17

## 2019-07-08 ENCOUNTER — Ambulatory Visit: Payer: 59 | Admitting: Physical Therapy

## 2019-07-09 ENCOUNTER — Ambulatory Visit: Payer: 59 | Admitting: Physical Therapy

## 2019-07-13 ENCOUNTER — Other Ambulatory Visit: Payer: Self-pay

## 2019-07-13 ENCOUNTER — Ambulatory Visit: Payer: 59 | Admitting: Physical Therapy

## 2019-07-13 DIAGNOSIS — M25661 Stiffness of right knee, not elsewhere classified: Secondary | ICD-10-CM | POA: Diagnosis not present

## 2019-07-13 DIAGNOSIS — M6281 Muscle weakness (generalized): Secondary | ICD-10-CM | POA: Diagnosis not present

## 2019-07-13 DIAGNOSIS — R269 Unspecified abnormalities of gait and mobility: Secondary | ICD-10-CM

## 2019-07-13 DIAGNOSIS — M25561 Pain in right knee: Secondary | ICD-10-CM

## 2019-07-13 DIAGNOSIS — Z96651 Presence of right artificial knee joint: Secondary | ICD-10-CM | POA: Diagnosis not present

## 2019-07-13 NOTE — Therapy (Signed)
[facility] [address]. [place], [place], [address] Phone: [phone]   Fax:  [phone]  Physical Therapy Treatment  Patient Details  Name: Samantha Mason MRN: [id number] Date of Birth: 06-Jan-1951 Referring Provider (PT): Dr. Marry Guan   Encounter Date: 07/13/2019  PT End of Session - 07/13/19 1459    Visit Number  10    Number of Visits  13    Date for PT Re-Evaluation  07/20/19    Authorization - Visit Number  6    Authorization - Number of Visits  10    PT Start Time  3762    PT Stop Time  1444    PT Time Calculation (min)  47 min    Activity Tolerance  Patient tolerated treatment well;Patient limited by pain    Behavior During Therapy  [facility] for tasks assessed/performed       Past Medical History:  Diagnosis Date  . Acute bronchitis   . Anemia   . Anxiety and depression   . Arthritis   . Asthma   . B12 deficiency 03/14/2017  . Chest pain 07/02/2018  . COPD (chronic obstructive pulmonary disease) ([place]) 07/24/2018  . Family history of blood clots 07/16/2017  . Irritable bowel syndrome   . Malignant hypertension   . Obesity   . OSA (obstructive sleep apnea)    uses cpap  . Pneumonia 2017  . Restless leg syndrome   . Stroke [facility]) 1995   mild    Past Surgical History:  Procedure Laterality Date  . CHOLECYSTECTOMY    . COLONOSCOPY WITH PROPOFOL N/A 03/31/2018   Procedure: COLONOSCOPY WITH PROPOFOL;  Surgeon: Lucilla Lame, MD;  Location: [facility] ENDOSCOPY;  Service: Endoscopy;  Laterality: N/A;  . JOINT REPLACEMENT  2014  . KNEE ARTHROPLASTY Right 04/30/2019   Procedure: COMPUTER ASSISTED TOTAL KNEE ARTHROPLASTY;  Surgeon: Dereck Leep, MD;  Location: [facility] ORS;  Service: Orthopedics;  Laterality: Right;  . saliva gland removal  1998  . TOTAL KNEE ARTHROPLASTY Left 2014  . TUBAL LIGATION Bilateral     There were no vitals filed for this visit.  Subjective Assessment - 07/13/19 1509    Subjective  Pt. reports  that she was able to take a shower without having to hold on to anything and felt more confident in her legs. Pt. is happy with her progress but wants to improve trusting her R knee.    Patient is accompained by:  Family member    Pertinent History  Pt. known to PT clinic.  See medical chart.    Limitations  Standing;Walking;House hold activities    Patient Stated Goals  Increase R knee ROM/ strength to improve pain-free mobility    Currently in Pain?  Yes    Pain Score  2     Pain Location  [place]    Pain Orientation  Right;Proximal;Lateral    Pain Descriptors / Indicators  Aching    Pain Type  Surgical pain;Chronic pain    Pain Onset  More than a month ago        TheraEx Standing stair stretch 3 x 30 seconds Standing Hip 3-way in //-bars with UE assist (light touch) 15 x bilaterally Standing hip flexion with step over hurdle 3 second hold x 5 laps Forward and backwards tandem stance walking on airex balance pad x 5 laps Standing calf raises with 3 second lower x 15  Seated LAQ with 3 second hold x 10  Supine SLR  2 x 10   NuStep 14' level 5 seat 7  (.50 mile)  Manual Therapy Seated knee flexion MET with towel under distal femur 5 second knee extension 10 seconds knee flexion stretch x 15 Supine knee flexion end range stretching 10 seconds x 10 (pt. Achieved 109 degs of knee flexion)    PT Long Term Goals - 06/22/19 1148      PT LONG TERM GOAL #1   Title  Pt. will increase FOTO to 53 to improve functional mobility.    Baseline  Initial FOTO: 31.  2/2: 47 (marked increase).    Time  4    Period  Weeks    Status  Partially Met    Target Date  07/20/19      PT LONG TERM GOAL #2   Title  Pt. will increase R knee AROM (0 to >110 deg.) to improve standing/walking.    Baseline  L knee AROM: 0 to 111 deg. R knee AROM: -4 to 84 deg. R knee ROM flexion 88 deg.  2/2: R knee 0 to 101 deg. after manual stretches.    Time  4    Period  Weeks    Status  Partially Met    Target Date   07/20/19      PT LONG TERM GOAL #3   Title  Pt. independent with HEP to increase LE strength 1/2 muscle grade to improve standing tolerance/ return to work.    Baseline  L LE strength grossly 5/5 MMT except hip flexion/ abduction 4+/5 MMT. R LE strength grossly 4+/5 MMT except hip flexion 4/5 MMT.  2/2: B hip flexion 4/5 MMT and B quad/hamstring 5/5 MMT.    Time  4    Period  Weeks    Status  Partially Met    Target Date  07/20/19      PT LONG TERM GOAL #4   Title  Pt. will ambulate with consistent 2-point gait pattern with use of SPC on inside/ outside surfaces to improve mobility.    Baseline  Moderate R antalgic gait pattern with use of RW    Time  4    Period  Weeks    Status  Partially Met    Target Date  07/20/19         Plan - 07/13/19 1501    Clinical Impression Statement  Pt. demonstrated increased standing tolerance on the R leg with UE support with standing hip 3 way and airex balance pad. Pt. had increased fatigue with forward/backward tandem walking with balance beam. Pt. had increased circumduction with the R knee with walking in //-bars with hurdles. Pt. required verbal cueing to decrease UE usage but was able to only use 1 UE in //-bars. Pt. had increased forward trunk lean with standing hip extension.Pt. was able to progress to level 5 resistance on the NuStep for increased duration to improve LE musuclar endurance. Pt. achieved 109 deg of R knee flexion today after seated METs.    Examination-Activity Limitations  Bathing;Stairs;Sleep;Squat;Lift;Toileting;Stand    Examination-Participation Restrictions  Driving    Stability/Clinical Decision Making  Evolving/Moderate complexity    Rehab Potential  Good    PT Frequency  2x / week    PT Duration  4 weeks    PT Treatment/Interventions  ADLs/Self Care Home Management;Aquatic Therapy;Cryotherapy;Moist Heat;Electrical Stimulation;Gait training;Stair training;Functional mobility training;Neuromuscular re-education;Balance  training;Therapeutic exercise;Therapeutic activities;Patient/family education;Manual techniques;Passive range of motion    PT Next Visit Plan  Add weights for standing hip 3  way, introduce squats/lifting techniques    PT Home Exercise Plan  Access Code: [email or web address]    Consulted and Agree with Plan of Care  Patient       Patient will benefit from skilled therapeutic intervention in order to improve the following deficits and impairments:  Abnormal gait, Decreased balance, Decreased endurance, Decreased mobility, Difficulty walking, Hypomobility, Obesity, Improper body mechanics, Decreased range of motion, Decreased activity tolerance, Decreased strength, Impaired flexibility, Postural dysfunction, Pain  Visit Diagnosis: Status post total right knee replacement  Joint stiffness of knee, right  Muscle weakness (generalized)  Gait difficulty  Acute pain of right knee     Problem List Patient Active Problem List   Diagnosis Date Noted  . Total knee replacement status 04/30/2019  . Primary osteoarthritis of right knee 02/13/2019  . S/P total knee arthroplasty, left 02/13/2019  . Aortic atherosclerosis ([place]) 08/01/2018  . B12 deficiency anemia 07/07/2018  . Benign neoplasm of ascending colon   . Positive colorectal cancer screening using Cologuard test 03/13/2018  . Severe persistent asthma 02/13/2018  . Vitamin D deficiency 03/17/2017  . Prediabetes 03/14/2017  . Microcytic anemia 03/14/2017  . IBS (irritable bowel syndrome) 03/12/2017  . Allergic rhinitis 03/12/2017  . Restless leg syndrome 03/12/2017  . Acid reflux 10/16/2016  . Essential hypertension 10/16/2016  . OSA on CPAP 12/08/2014  . BMI 50.0-59.9, adult ([place]) 05/31/2014   Pura Spice, PT, DPT # [id number] Andrey Campanile, SPT 07/14/2019, 11:23 AM  [facility] [address] [facility] [address] [place], [place], [address] Phone: [phone]   Fax:  [phone]  Name: MYLENA SEDBERRY MRN: [id number] Date of Birth: Oct 23, 1950

## 2019-07-14 ENCOUNTER — Encounter: Payer: Self-pay | Admitting: Physical Therapy

## 2019-07-15 ENCOUNTER — Encounter: Payer: Self-pay | Admitting: Physical Therapy

## 2019-07-15 ENCOUNTER — Other Ambulatory Visit: Payer: Self-pay

## 2019-07-15 ENCOUNTER — Ambulatory Visit: Payer: 59 | Admitting: Physical Therapy

## 2019-07-15 DIAGNOSIS — M25561 Pain in right knee: Secondary | ICD-10-CM | POA: Diagnosis not present

## 2019-07-15 DIAGNOSIS — Z96651 Presence of right artificial knee joint: Secondary | ICD-10-CM

## 2019-07-15 DIAGNOSIS — M25661 Stiffness of right knee, not elsewhere classified: Secondary | ICD-10-CM | POA: Diagnosis not present

## 2019-07-15 DIAGNOSIS — M6281 Muscle weakness (generalized): Secondary | ICD-10-CM

## 2019-07-15 DIAGNOSIS — R269 Unspecified abnormalities of gait and mobility: Secondary | ICD-10-CM

## 2019-07-15 NOTE — Therapy (Signed)
[facility] [address]. [place], [place], [address] Phone: [phone]   Fax:  [phone]  Physical Therapy Treatment  Patient Details  Name: Samantha Mason MRN: [id number] Date of Birth: Jan 12, 1951 Referring Provider (PT): Dr. Marry Guan   Encounter Date: 07/15/2019  PT End of Session - 07/15/19 1451    Visit Number  11    Number of Visits  13    Date for PT Re-Evaluation  07/20/19    Authorization - Visit Number  7    Authorization - Number of Visits  10    PT Start Time  0157    PT Stop Time  2355    PT Time Calculation (min)  52 min    Activity Tolerance  Patient tolerated treatment well;Patient limited by fatigue;Patient limited by pain    Behavior During Therapy  [facility] for tasks assessed/performed       Past Medical History:  Diagnosis Date  . Acute bronchitis   . Anemia   . Anxiety and depression   . Arthritis   . Asthma   . B12 deficiency 03/14/2017  . Chest pain 07/02/2018  . COPD (chronic obstructive pulmonary disease) ([place]) 07/24/2018  . Family history of blood clots 07/16/2017  . Irritable bowel syndrome   . Malignant hypertension   . Obesity   . OSA (obstructive sleep apnea)    uses cpap  . Pneumonia 2017  . Restless leg syndrome   . Stroke [facility]) 1995   mild    Past Surgical History:  Procedure Laterality Date  . CHOLECYSTECTOMY    . COLONOSCOPY WITH PROPOFOL N/A 03/31/2018   Procedure: COLONOSCOPY WITH PROPOFOL;  Surgeon: Lucilla Lame, MD;  Location: [facility] ENDOSCOPY;  Service: Endoscopy;  Laterality: N/A;  . JOINT REPLACEMENT  2014  . KNEE ARTHROPLASTY Right 04/30/2019   Procedure: COMPUTER ASSISTED TOTAL KNEE ARTHROPLASTY;  Surgeon: Dereck Leep, MD;  Location: [facility] ORS;  Service: Orthopedics;  Laterality: Right;  . saliva gland removal  1998  . TOTAL KNEE ARTHROPLASTY Left 2014  . TUBAL LIGATION Bilateral     There were no vitals filed for this visit.  Subjective Assessment - 07/15/19 1450     Subjective  Pt. reports some pain in the R lateral joint line but does not have sensation to the area. Pt. reports some soreness from last treatment session.    Patient is accompained by:  Family member    Pertinent History  Pt. known to PT clinic.  See medical chart.    Limitations  Standing;Walking;House hold activities    Patient Stated Goals  Increase R knee ROM/ strength to improve pain-free mobility    Currently in Pain?  Yes    Pain Score  2     Pain Orientation  Right;Proximal;Lateral    Pain Descriptors / Indicators  Aching    Pain Type  Surgical pain    Pain Onset  More than a month ago       TheraEx NuStep seat #7 10 min level 6 resistance Standing knee flexion step stretches 10 second holds x 10  Squats to blue mat table w/ 8# ball (lowered all the way) 3 x 10  TRX squat touches to chair 3 x 10  Standing resisted side steps with RTB above knee 3 minutes with UE support  Supine knee flexion with swiss ball 3 x 10  Supine bridging 3 x 10   Manual Therapy Manual knee flexion stretching 10 sec hold at  end range x 10 with 15 second rest in between sets Biofreeze massage to lateral joint line x 2 minutes      PT Long Term Goals - 06/22/19 1148      PT LONG TERM GOAL #1   Title  Pt. will increase FOTO to 53 to improve functional mobility.    Baseline  Initial FOTO: 31.  2/2: 47 (marked increase).    Time  4    Period  Weeks    Status  Partially Met    Target Date  07/20/19      PT LONG TERM GOAL #2   Title  Pt. will increase R knee AROM (0 to >110 deg.) to improve standing/walking.    Baseline  L knee AROM: 0 to 111 deg. R knee AROM: -4 to 84 deg. R knee ROM flexion 88 deg.  2/2: R knee 0 to 101 deg. after manual stretches.    Time  4    Period  Weeks    Status  Partially Met    Target Date  07/20/19      PT LONG TERM GOAL #3   Title  Pt. independent with HEP to increase LE strength 1/2 muscle grade to improve standing tolerance/ return to work.    Baseline   L LE strength grossly 5/5 MMT except hip flexion/ abduction 4+/5 MMT. R LE strength grossly 4+/5 MMT except hip flexion 4/5 MMT.  2/2: B hip flexion 4/5 MMT and B quad/hamstring 5/5 MMT.    Time  4    Period  Weeks    Status  Partially Met    Target Date  07/20/19      PT LONG TERM GOAL #4   Title  Pt. will ambulate with consistent 2-point gait pattern with use of SPC on inside/ outside surfaces to improve mobility.    Baseline  Moderate R antalgic gait pattern with use of RW    Time  4    Period  Weeks    Status  Partially Met    Target Date  07/20/19            Plan - 07/15/19 1452    Clinical Impression Statement  Pt. demonstrated improved eccentric quad control with TRX squat touches to chair. Pt. was fatigued with standing resisted lateral walks with UE support after 3 minutes. Pt. has increased SOB with exercise and requires verbal cueing for decreased bilateral LE external rotation with side stepping. Pt. ambulates with increased lateral trunk lean. Pt. increased NuStep resistance to level 6 for 10 minutes.    Examination-Activity Limitations  Bathing;Stairs;Sleep;Squat;Lift;Toileting;Stand    Examination-Participation Restrictions  Driving    Stability/Clinical Decision Making  Evolving/Moderate complexity    Rehab Potential  Good    PT Frequency  2x / week    PT Duration  4 weeks    PT Treatment/Interventions  ADLs/Self Care Home Management;Aquatic Therapy;Cryotherapy;Moist Heat;Electrical Stimulation;Gait training;Stair training;Functional mobility training;Neuromuscular re-education;Balance training;Therapeutic exercise;Therapeutic activities;Patient/family education;Manual techniques;Passive range of motion    PT Next Visit Plan  Increase standing time, resisted sidesteps, stagger squats    PT Home Exercise Plan  Access Code: [email or web address]    Consulted and Agree with Plan of Care  Patient       Patient will benefit from skilled therapeutic intervention in order to improve  the following deficits and impairments:  Abnormal gait, Decreased balance, Decreased endurance, Decreased mobility, Difficulty walking, Hypomobility, Obesity, Improper body mechanics, Decreased range of motion, Decreased activity tolerance, Decreased strength, Impaired  flexibility, Postural dysfunction, Pain  Visit Diagnosis: Status post total right knee replacement  Joint stiffness of knee, right  Muscle weakness (generalized)  Gait difficulty  Acute pain of right knee     Problem List Patient Active Problem List   Diagnosis Date Noted  . Total knee replacement status 04/30/2019  . Primary osteoarthritis of right knee 02/13/2019  . S/P total knee arthroplasty, left 02/13/2019  . Aortic atherosclerosis ([place]) 08/01/2018  . B12 deficiency anemia 07/07/2018  . Benign neoplasm of ascending colon   . Positive colorectal cancer screening using Cologuard test 03/13/2018  . Severe persistent asthma 02/13/2018  . Vitamin D deficiency 03/17/2017  . Prediabetes 03/14/2017  . Microcytic anemia 03/14/2017  . IBS (irritable bowel syndrome) 03/12/2017  . Allergic rhinitis 03/12/2017  . Restless leg syndrome 03/12/2017  . Acid reflux 10/16/2016  . Essential hypertension 10/16/2016  . OSA on CPAP 12/08/2014  . BMI 50.0-59.9, adult ([place]) 05/31/2014   Pura Spice, PT, DPT # [id number] Andrey Campanile, SPT 07/16/2019, 12:36 PM  [place] [facility] [address] [place], [place], [address] Phone: [phone]   Fax:  [phone]  Name: Samantha Mason MRN: [id number] Date of Birth: 11-05-1950

## 2019-07-20 ENCOUNTER — Other Ambulatory Visit: Payer: Self-pay

## 2019-07-20 ENCOUNTER — Encounter: Payer: Self-pay | Admitting: Physical Therapy

## 2019-07-20 ENCOUNTER — Ambulatory Visit: Payer: 59 | Attending: Orthopedic Surgery | Admitting: Physical Therapy

## 2019-07-20 DIAGNOSIS — M6281 Muscle weakness (generalized): Secondary | ICD-10-CM | POA: Insufficient documentation

## 2019-07-20 DIAGNOSIS — M25561 Pain in right knee: Secondary | ICD-10-CM | POA: Diagnosis present

## 2019-07-20 DIAGNOSIS — R269 Unspecified abnormalities of gait and mobility: Secondary | ICD-10-CM | POA: Diagnosis present

## 2019-07-20 DIAGNOSIS — Z96651 Presence of right artificial knee joint: Secondary | ICD-10-CM | POA: Diagnosis not present

## 2019-07-20 DIAGNOSIS — M25661 Stiffness of right knee, not elsewhere classified: Secondary | ICD-10-CM | POA: Insufficient documentation

## 2019-07-20 NOTE — Therapy (Signed)
[place] [facility] [address]. [place], [place], [address] Phone: [phone]   Fax:  [phone]  Physical Therapy Treatment  Patient Details  Name: Samantha Mason MRN: [id number] Date of Birth: [birth year] Referring Provider (PT): Dr. Marry Guan   Encounter Date: 07/20/2019  PT End of Session - 07/20/19 1405    Visit Number  12    Number of Visits  13    Date for PT Re-Evaluation  07/20/19    Authorization - Visit Number  8    Authorization - Number of Visits  10    PT Start Time  1401    PT Stop Time  1451    PT Time Calculation (min)  50 min    Activity Tolerance  Patient tolerated treatment well;Patient limited by fatigue;Patient limited by pain    Behavior During Therapy  [facility] for tasks assessed/performed       Past Medical History:  Diagnosis Date  . Acute bronchitis   . Anemia   . Anxiety and depression   . Arthritis   . Asthma   . B12 deficiency 03/14/2017  . Chest pain 07/02/2018  . COPD (chronic obstructive pulmonary disease) ([place]) 07/24/2018  . Family history of blood clots 07/16/2017  . Irritable bowel syndrome   . Malignant hypertension   . Obesity   . OSA (obstructive sleep apnea)    uses cpap  . Pneumonia 2017  . Restless leg syndrome   . Stroke [facility]) 1995   mild    Past Surgical History:  Procedure Laterality Date  . CHOLECYSTECTOMY    . COLONOSCOPY WITH PROPOFOL N/A 03/31/2018   Procedure: COLONOSCOPY WITH PROPOFOL;  Surgeon: Lucilla Lame, MD;  Location: [facility] ENDOSCOPY;  Service: Endoscopy;  Laterality: N/A;  . JOINT REPLACEMENT  2014  . KNEE ARTHROPLASTY Right 04/30/2019   Procedure: COMPUTER ASSISTED TOTAL KNEE ARTHROPLASTY;  Surgeon: Dereck Leep, MD;  Location: [facility] ORS;  Service: Orthopedics;  Laterality: Right;  . saliva gland removal  1998  . TOTAL KNEE ARTHROPLASTY Left 2014  . TUBAL LIGATION Bilateral     There were no vitals filed for this visit.  Subjective Assessment - 07/20/19 1404    Subjective  Pt. reports no new complaints.  Pt. states she was inactive over the weekend.  Pt. hopes to return to work at end to the month.  MD f/u on 3/16.    Patient is accompained by:  Family member    Pertinent History  Pt. known to PT clinic.  See medical chart.    Limitations  Standing;Walking;House hold activities    Patient Stated Goals  Increase R knee ROM/ strength to improve pain-free mobility    Currently in Pain?  No/denies         Thera Ex.  NuStep seat #7 10 min level 6 resistance Standing 4# LE ex. Program (marching/ hip abduction/ heel raises/ knee flexion/ LAQ/ walking in PT clinic)- 20x each.  Cuing to improve posture.   Sit to standing from gray chair 5x (no UE assist)  Step ups/ down at stairs (focus on quad control) Walking in hallway with cuing to correct lateral leaning during gait  Manual Tx.  Manual knee flexion stretching 30 sec hold at end range x 5. Supine distal quad STM during knee flexion stretches.  Reviewed scar massage     PT Long Term Goals - 06/22/19 1148      PT LONG TERM GOAL #1   Title  Pt. will  increase FOTO to 53 to improve functional mobility.    Baseline  Initial FOTO: 31.  2/2: 47 (marked increase).    Time  4    Period  Weeks    Status  Partially Met    Target Date  07/20/19      PT LONG TERM GOAL #2   Title  Pt. will increase R knee AROM (0 to >110 deg.) to improve standing/walking.    Baseline  L knee AROM: 0 to 111 deg. R knee AROM: -4 to 84 deg. R knee ROM flexion 88 deg.  2/2: R knee 0 to 101 deg. after manual stretches.    Time  4    Period  Weeks    Status  Partially Met    Target Date  07/20/19      PT LONG TERM GOAL #3   Title  Pt. independent with HEP to increase LE strength 1/2 muscle grade to improve standing tolerance/ return to work.    Baseline  L LE strength grossly 5/5 MMT except hip flexion/ abduction 4+/5 MMT. R LE strength grossly 4+/5 MMT except hip flexion 4/5 MMT.  2/2: B hip flexion 4/5 MMT and B  quad/hamstring 5/5 MMT.    Time  4    Period  Weeks    Status  Partially Met    Target Date  07/20/19      PT LONG TERM GOAL #4   Title  Pt. will ambulate with consistent 2-point gait pattern with use of SPC on inside/ outside surfaces to improve mobility.    Baseline  Moderate R antalgic gait pattern with use of RW    Time  4    Period  Weeks    Status  Partially Met    Target Date  07/20/19            Plan - 07/20/19 1406    Clinical Impression Statement  R knee flexion to 110 deg. after LE stretching/ manual tx.  Pt. ambulates with less lateral leaning when cued during hallway/ outside walking.  Light UE assist on handrails during step ups/ down.  Improving quad control and progression to longer standing tolerance during therex. in //-bars.  Pt. hoping to return to work at end of month.    Examination-Activity Limitations  Bathing;Stairs;Sleep;Squat;Lift;Toileting;Stand    Examination-Participation Restrictions  Driving    Stability/Clinical Decision Making  Evolving/Moderate complexity    Clinical Decision Making  Moderate    Rehab Potential  Good    PT Frequency  2x / week    PT Duration  4 weeks    PT Treatment/Interventions  ADLs/Self Care Home Management;Aquatic Therapy;Cryotherapy;Moist Heat;Electrical Stimulation;Gait training;Stair training;Functional mobility training;Neuromuscular re-education;Balance training;Therapeutic exercise;Therapeutic activities;Patient/family education;Manual techniques;Passive range of motion    PT Next Visit Plan  Increase standing time, resisted sidesteps, quad strengthening.  RECERT next tx. session.    PT Home Exercise Plan  Access Code: [email or web address]    Consulted and Agree with Plan of Care  Patient       Patient will benefit from skilled therapeutic intervention in order to improve the following deficits and impairments:  Abnormal gait, Decreased balance, Decreased endurance, Decreased mobility, Difficulty walking, Hypomobility, Obesity,  Improper body mechanics, Decreased range of motion, Decreased activity tolerance, Decreased strength, Impaired flexibility, Postural dysfunction, Pain  Visit Diagnosis: Status post total right knee replacement  Joint stiffness of knee, right  Muscle weakness (generalized)  Gait difficulty  Acute pain of right knee     Problem List  Patient Active Problem List   Diagnosis Date Noted  . Total knee replacement status 04/30/2019  . Primary osteoarthritis of right knee 02/13/2019  . S/P total knee arthroplasty, left 02/13/2019  . Aortic atherosclerosis ([place]) 08/01/2018  . B12 deficiency anemia 07/07/2018  . Benign neoplasm of ascending colon   . Positive colorectal cancer screening using Cologuard test 03/13/2018  . Severe persistent asthma 02/13/2018  . Vitamin D deficiency 03/17/2017  . Prediabetes 03/14/2017  . Microcytic anemia 03/14/2017  . IBS (irritable bowel syndrome) 03/12/2017  . Allergic rhinitis 03/12/2017  . Restless leg syndrome 03/12/2017  . Acid reflux 10/16/2016  . Essential hypertension 10/16/2016  . OSA on CPAP 12/08/2014  . BMI 50.0-59.9, adult ([place]) 05/31/2014   Pura Spice, PT, DPT # [id number] 07/20/2019, 6:43 PM  [facility] [address] [place], [place], [address] Phone: [phone]   Fax:  [phone]  Name: Samantha Mason MRN: [id number] Date of Birth: 11/17/1950

## 2019-07-22 ENCOUNTER — Other Ambulatory Visit: Payer: Self-pay

## 2019-07-22 ENCOUNTER — Ambulatory Visit: Payer: 59 | Admitting: Physical Therapy

## 2019-07-22 ENCOUNTER — Encounter: Payer: Self-pay | Admitting: Physical Therapy

## 2019-07-22 DIAGNOSIS — M25661 Stiffness of right knee, not elsewhere classified: Secondary | ICD-10-CM

## 2019-07-22 DIAGNOSIS — M25561 Pain in right knee: Secondary | ICD-10-CM

## 2019-07-22 DIAGNOSIS — M6281 Muscle weakness (generalized): Secondary | ICD-10-CM

## 2019-07-22 DIAGNOSIS — Z96651 Presence of right artificial knee joint: Secondary | ICD-10-CM | POA: Diagnosis not present

## 2019-07-22 DIAGNOSIS — R269 Unspecified abnormalities of gait and mobility: Secondary | ICD-10-CM | POA: Diagnosis not present

## 2019-07-22 NOTE — Therapy (Signed)
[facility] [address] [place], [place], [address] Phone: [phone]   Fax:  [phone]  Physical Therapy Treatment  Patient Details  Name: Samantha Mason MRN: [id number] Date of Birth: 1951/03/07 Referring Provider (PT): Dr. Marry Guan   Encounter Date: 07/22/2019   Treatment: 43 of 21.  Recert date: 06/22/6331 5456 to 1444   Past Medical History:  Diagnosis Date  . Acute bronchitis   . Anemia   . Anxiety and depression   . Arthritis   . Asthma   . B12 deficiency 03/14/2017  . Chest pain 07/02/2018  . COPD (chronic obstructive pulmonary disease) ([place]) 07/24/2018  . Family history of blood clots 07/16/2017  . Irritable bowel syndrome   . Malignant hypertension   . Obesity   . OSA (obstructive sleep apnea)    uses cpap  . Pneumonia 2017  . Restless leg syndrome   . Stroke [facility]) 1995   mild    Past Surgical History:  Procedure Laterality Date  . CHOLECYSTECTOMY    . COLONOSCOPY WITH PROPOFOL N/A 03/31/2018   Procedure: COLONOSCOPY WITH PROPOFOL;  Surgeon: Lucilla Lame, MD;  Location: [facility] ENDOSCOPY;  Service: Endoscopy;  Laterality: N/A;  . JOINT REPLACEMENT  2014  . KNEE ARTHROPLASTY Right 04/30/2019   Procedure: COMPUTER ASSISTED TOTAL KNEE ARTHROPLASTY;  Surgeon: Dereck Leep, MD;  Location: [facility] ORS;  Service: Orthopedics;  Laterality: Right;  . saliva gland removal  1998  . TOTAL KNEE ARTHROPLASTY Left 2014  . TUBAL LIGATION Bilateral     There were no vitals filed for this visit.     Pt. reports no pain. Pt. states she hasn't been too busy at home. Pt. return to Dr. Marry Guan in a couple weeks to determine RTW date.       Thera Ex.  NuStep seat #7 10 min L6 (warm-up)- no rest breaks.   TRX sit to stand 10x2 (chair landmark).   Standing 4# LE ex. Program (high marching/ lateral walking/ toe and heel raises/ knee flexion/ LAQ/ walking in PT clinic with no assistive device).   20x each.  Cuing to  improve posture.   Step ups/ down at stairs (focus on quad control) Walking in hallway with cuing to correct lateral leaning during gait  Manual tx.:  Supine R knee flexion/ extension stretches at mat table (110 deg. Flexion after tx.).    FOTO: 63 (goal met)    PT Long Term Goals - 07/27/19 1629      PT LONG TERM GOAL #1   Title  Pt. will increase FOTO to 53 to improve functional mobility.    Baseline  Initial FOTO: 31.  2/2: 47 (marked increase).  3/4: 63    Time  4    Period  Weeks    Status  Achieved    Target Date  07/22/19      PT LONG TERM GOAL #2   Title  Pt. will increase R knee AROM (0 to >110 deg.) to improve standing/walking.    Baseline  L knee AROM: 0 to 111 deg. R knee AROM: -4 to 84 deg. R knee ROM flexion 88 deg.  2/2: R knee 0 to 101 deg. after manual stretches.  3/4: 110 deg. after manual stretches.    Time  4    Period  Weeks    Status  Partially Met    Target Date  08/19/19      PT LONG TERM GOAL #3   Title  Pt. independent with HEP to increase LE strength 1/2 muscle grade to improve standing tolerance/ return to work.    Baseline  L LE strength grossly 5/5 MMT except hip flexion/ abduction 4+/5 MMT. R LE strength grossly 4+/5 MMT except hip flexion 4/5 MMT.  2/2: B hip flexion 4/5 MMT and B quad/hamstring 5/5 MMT.    Time  4    Period  Weeks    Status  Partially Met    Target Date  08/19/19      PT LONG TERM GOAL #4   Title  Pt. will ambulate with consistent 2-point gait pattern with use of SPC on inside/ outside surfaces to improve mobility.    Baseline  Moderate R antalgic gait pattern with use of RW.  3/4: pt. ambulates without assistive device.  Cuing to correct lateral sway/ leaning.    Time  4    Period  Weeks    Status  Partially Met    Target Date  08/19/19         FOTO: initial 31/ today 63/ goal 53. Verbal correction with walking in hallway/ outside to prevent lateral leaning. R knee flexion to 110 deg. after LE stretching/ manual tx.  Pt. ambulates with less lateral leaning when cued during hallway/ outside walking. Light UE assist on handrails during step ups/ down. Improving quad control and progression to longer standing tolerance during therex. in //-bars. Pt. hoping to return to work at end of month.      Patient will benefit from skilled therapeutic intervention in order to improve the following deficits and impairments:  Abnormal gait, Decreased balance, Decreased endurance, Decreased mobility, Difficulty walking, Hypomobility, Obesity, Improper body mechanics, Decreased range of motion, Decreased activity tolerance, Decreased strength, Impaired flexibility, Postural dysfunction, Pain  Visit Diagnosis: Joint stiffness of knee, right  Status post total right knee replacement  Muscle weakness (generalized)  Gait difficulty  Acute pain of right knee     Problem List Patient Active Problem List   Diagnosis Date Noted  . Total knee replacement status 04/30/2019  . Primary osteoarthritis of right knee 02/13/2019  . S/P total knee arthroplasty, left 02/13/2019  . Aortic atherosclerosis ([place]) 08/01/2018  . B12 deficiency anemia 07/07/2018  . Benign neoplasm of ascending colon   . Positive colorectal cancer screening using Cologuard test 03/13/2018  . Severe persistent asthma 02/13/2018  . Vitamin D deficiency 03/17/2017  . Prediabetes 03/14/2017  . Microcytic anemia 03/14/2017  . IBS (irritable bowel syndrome) 03/12/2017  . Allergic rhinitis 03/12/2017  . Restless leg syndrome 03/12/2017  . Acid reflux 10/16/2016  . Essential hypertension 10/16/2016  . OSA on CPAP 12/08/2014  . BMI 50.0-59.9, adult ([place]) 05/31/2014   Pura Spice, PT, DPT # [phone] 07/27/2019, 4:31 PM  [place] [address] [place], [place], [address] Phone: [phone]   Fax:  [phone]  Name: Samantha Mason MRN: [id number] Date of Birth: 06/09/1950

## 2019-07-27 ENCOUNTER — Ambulatory Visit: Payer: 59 | Admitting: Physical Therapy

## 2019-07-27 ENCOUNTER — Other Ambulatory Visit: Payer: Self-pay

## 2019-07-27 DIAGNOSIS — M6281 Muscle weakness (generalized): Secondary | ICD-10-CM

## 2019-07-27 DIAGNOSIS — Z96651 Presence of right artificial knee joint: Secondary | ICD-10-CM | POA: Diagnosis not present

## 2019-07-27 DIAGNOSIS — M25661 Stiffness of right knee, not elsewhere classified: Secondary | ICD-10-CM

## 2019-07-27 DIAGNOSIS — R269 Unspecified abnormalities of gait and mobility: Secondary | ICD-10-CM | POA: Diagnosis not present

## 2019-07-27 DIAGNOSIS — M25561 Pain in right knee: Secondary | ICD-10-CM | POA: Diagnosis not present

## 2019-07-29 ENCOUNTER — Other Ambulatory Visit: Payer: Self-pay

## 2019-07-29 ENCOUNTER — Ambulatory Visit: Payer: 59 | Admitting: Physical Therapy

## 2019-07-29 ENCOUNTER — Encounter: Payer: Self-pay | Admitting: Physical Therapy

## 2019-07-29 DIAGNOSIS — Z96651 Presence of right artificial knee joint: Secondary | ICD-10-CM | POA: Diagnosis not present

## 2019-07-29 DIAGNOSIS — R269 Unspecified abnormalities of gait and mobility: Secondary | ICD-10-CM | POA: Diagnosis not present

## 2019-07-29 DIAGNOSIS — M25561 Pain in right knee: Secondary | ICD-10-CM | POA: Diagnosis not present

## 2019-07-29 DIAGNOSIS — M6281 Muscle weakness (generalized): Secondary | ICD-10-CM

## 2019-07-29 DIAGNOSIS — M25661 Stiffness of right knee, not elsewhere classified: Secondary | ICD-10-CM | POA: Diagnosis not present

## 2019-07-30 NOTE — Therapy (Signed)
[facility] [address] [place], [place], [address] Phone: [phone]   Fax:  [phone]  Physical Therapy Treatment  Patient Details  Name: Samantha Mason MRN: [id number] Date of Birth: 04-24-1951 Referring Provider (PT): Dr. Marry Guan   Encounter Date: 07/27/2019  PT End of Session - 07/30/19 1914    Visit Number  14    Number of Visits  21    Date for PT Re-Evaluation  08/19/19    Authorization - Visit Number  2    Authorization - Number of Visits  10    PT Start Time  4656    PT Stop Time  1450    PT Time Calculation (min)  53 min    Activity Tolerance  Patient tolerated treatment well;Patient limited by fatigue    Behavior During Therapy  [facility] for tasks assessed/performed       Past Medical History:  Diagnosis Date  . Acute bronchitis   . Anemia   . Anxiety and depression   . Arthritis   . Asthma   . B12 deficiency 03/14/2017  . Chest pain 07/02/2018  . COPD (chronic obstructive pulmonary disease) ([place]) 07/24/2018  . Family history of blood clots 07/16/2017  . Irritable bowel syndrome   . Malignant hypertension   . Obesity   . OSA (obstructive sleep apnea)    uses cpap  . Pneumonia 2017  . Restless leg syndrome   . Stroke [facility]) 1995   mild    Past Surgical History:  Procedure Laterality Date  . CHOLECYSTECTOMY    . COLONOSCOPY WITH PROPOFOL N/A 03/31/2018   Procedure: COLONOSCOPY WITH PROPOFOL;  Surgeon: Lucilla Lame, MD;  Location: [facility] ENDOSCOPY;  Service: Endoscopy;  Laterality: N/A;  . JOINT REPLACEMENT  2014  . KNEE ARTHROPLASTY Right 04/30/2019   Procedure: COMPUTER ASSISTED TOTAL KNEE ARTHROPLASTY;  Surgeon: Dereck Leep, MD;  Location: [facility] ORS;  Service: Orthopedics;  Laterality: Right;  . saliva gland removal  1998  . TOTAL KNEE ARTHROPLASTY Left 2014  . TUBAL LIGATION Bilateral     There were no vitals filed for this visit.      Pt. entered PT with no new complaints. Pt. is hoping to  return to work the week after MD f/u.      Thera Ex.  NuStep seat #7 10 min L6 (warm-up)- no rest breaks.   TRX sit to stand 10x2 (chair landmark).   Standing 5# LE ex. Program (high marching/ lateral walking/ toe and heel raises/ knee flexion/ LAQ/ walking in PT clinic with no assistive device).   20x each. Cuing to improve posture.  Step ups/ down at stairs (focus on quad control) Walking in hallway with cuing to correct lateral leaning during gait Airex step ups/ overs (forward/ lateral).   Walking lunges in //-bars 3x    PT Long Term Goals - 07/27/19 1629      PT LONG TERM GOAL #1   Title  Pt. will increase FOTO to 53 to improve functional mobility.    Baseline  Initial FOTO: 31.  2/2: 47 (marked increase).  3/4: 63    Time  4    Period  Weeks    Status  Achieved    Target Date  07/22/19      PT LONG TERM GOAL #2   Title  Pt. will increase R knee AROM (0 to >110 deg.) to improve standing/walking.    Baseline  L knee AROM: 0 to 111  deg. R knee AROM: -4 to 84 deg. R knee ROM flexion 88 deg.  2/2: R knee 0 to 101 deg. after manual stretches.  3/4: 110 deg. after manual stretches.    Time  4    Period  Weeks    Status  Partially Met    Target Date  08/19/19      PT LONG TERM GOAL #3   Title  Pt. independent with HEP to increase LE strength 1/2 muscle grade to improve standing tolerance/ return to work.    Baseline  L LE strength grossly 5/5 MMT except hip flexion/ abduction 4+/5 MMT. R LE strength grossly 4+/5 MMT except hip flexion 4/5 MMT.  2/2: B hip flexion 4/5 MMT and B quad/hamstring 5/5 MMT.    Time  4    Period  Weeks    Status  Partially Met    Target Date  08/19/19      PT LONG TERM GOAL #4   Title  Pt. will ambulate with consistent 2-point gait pattern with use of SPC on inside/ outside surfaces to improve mobility.    Baseline  Moderate R antalgic gait pattern with use of RW.  3/4: pt. ambulates without assistive device.  Cuing to correct lateral sway/  leaning.    Time  4    Period  Weeks    Status  Partially Met    Target Date  08/19/19            Plan - 07/30/19 1915    Clinical Impression Statement  PT focusing on LE muscle strengthening/endurance to assist with returning to work.  Pt. ambulates with consistent knee flexion/ step pattern and focused on preventing lateral leaning.  No issues with Airex step ups and progressing well with standing resisted LE ther.ex. in //-bars.  Pt. encouraged to walk on a daily basis to improve LE endurance.    Examination-Activity Limitations  Bathing;Stairs;Sleep;Squat;Lift;Toileting;Stand    Examination-Participation Restrictions  Driving    Stability/Clinical Decision Making  Evolving/Moderate complexity    Clinical Decision Making  Moderate    Rehab Potential  Good    PT Frequency  2x / week    PT Duration  4 weeks    PT Treatment/Interventions  ADLs/Self Care Home Management;Aquatic Therapy;Cryotherapy;Moist Heat;Electrical Stimulation;Gait training;Stair training;Functional mobility training;Neuromuscular re-education;Balance training;Therapeutic exercise;Therapeutic activities;Patient/family education;Manual techniques;Passive range of motion    PT Next Visit Plan  Increase B LE generalized strengthening/ standing endurance to improve return to work.    PT Home Exercise Plan  Access Code: [email or web address]    Consulted and Agree with Plan of Care  Patient       Patient will benefit from skilled therapeutic intervention in order to improve the following deficits and impairments:  Abnormal gait, Decreased balance, Decreased endurance, Decreased mobility, Difficulty walking, Hypomobility, Obesity, Improper body mechanics, Decreased range of motion, Decreased activity tolerance, Decreased strength, Impaired flexibility, Postural dysfunction, Pain  Visit Diagnosis: Status post total right knee replacement  Joint stiffness of knee, right  Muscle weakness (generalized)  Gait difficulty  Acute  pain of right knee     Problem List Patient Active Problem List   Diagnosis Date Noted  . Total knee replacement status 04/30/2019  . Primary osteoarthritis of right knee 02/13/2019  . S/P total knee arthroplasty, left 02/13/2019  . Aortic atherosclerosis ([place]) 08/01/2018  . B12 deficiency anemia 07/07/2018  . Benign neoplasm of ascending colon   . Positive colorectal cancer screening using Cologuard test 03/13/2018  . Severe persistent  asthma 02/13/2018  . Vitamin D deficiency 03/17/2017  . Prediabetes 03/14/2017  . Microcytic anemia 03/14/2017  . IBS (irritable bowel syndrome) 03/12/2017  . Allergic rhinitis 03/12/2017  . Restless leg syndrome 03/12/2017  . Acid reflux 10/16/2016  . Essential hypertension 10/16/2016  . OSA on CPAP 12/08/2014  . BMI 50.0-59.9, adult ([place]) 05/31/2014   Pura Spice, PT, DPT # [phone] 07/30/2019, 7:26 PM  [facility] [facility] [address] [place], [place], [address] Phone: [phone]   Fax:  [phone]  Name: Samantha Mason MRN: [id number] Date of Birth: Jul 12, 1950

## 2019-07-31 ENCOUNTER — Encounter: Payer: Self-pay | Admitting: Physical Therapy

## 2019-07-31 NOTE — Therapy (Signed)
[place] [address] [place], [place], [address] Phone: [phone]   Fax:  [phone]  Physical Therapy Treatment  Patient Details  Name: Samantha Mason MRN: [id number] Date of Birth: [birth year] Referring Provider (PT): Dr. Marry Guan   Encounter Date: 07/29/2019  PT End of Session - 07/31/19 1232    Visit Number  15    Number of Visits  21    Date for PT Re-Evaluation  08/19/19    Authorization - Visit Number  3    Authorization - Number of Visits  10    PT Start Time  7903    PT Stop Time  1453    PT Time Calculation (min)  51 min    Activity Tolerance  Patient tolerated treatment well;Patient limited by fatigue    Behavior During Therapy  [facility] for tasks assessed/performed       Past Medical History:  Diagnosis Date  . Acute bronchitis   . Anemia   . Anxiety and depression   . Arthritis   . Asthma   . B12 deficiency 03/14/2017  . Chest pain 07/02/2018  . COPD (chronic obstructive pulmonary disease) ([place]) 07/24/2018  . Family history of blood clots 07/16/2017  . Irritable bowel syndrome   . Malignant hypertension   . Obesity   . OSA (obstructive sleep apnea)    uses cpap  . Pneumonia 2017  . Restless leg syndrome   . Stroke [facility]) 1995   mild    Past Surgical History:  Procedure Laterality Date  . CHOLECYSTECTOMY    . COLONOSCOPY WITH PROPOFOL N/A 03/31/2018   Procedure: COLONOSCOPY WITH PROPOFOL;  Surgeon: Lucilla Lame, MD;  Location: [facility] ENDOSCOPY;  Service: Endoscopy;  Laterality: N/A;  . JOINT REPLACEMENT  2014  . KNEE ARTHROPLASTY Right 04/30/2019   Procedure: COMPUTER ASSISTED TOTAL KNEE ARTHROPLASTY;  Surgeon: Dereck Leep, MD;  Location: [facility] ORS;  Service: Orthopedics;  Laterality: Right;  . saliva gland removal  1998  . TOTAL KNEE ARTHROPLASTY Left 2014  . TUBAL LIGATION Bilateral     There were no vitals filed for this visit.  Subjective Assessment - 07/31/19 1225    Subjective  Pt.  reports no pain in knee.  Pt. is hoping to return to work the week after MD f/u.  Pt. feels she will be able to return to work and is hoping to start parttime and progress to fulltime over several weeks.    Patient is accompained by:  Family member    Pertinent History  Pt. known to PT clinic.  See medical chart.    Limitations  Standing;Walking;House hold activities    Patient Stated Goals  Increase R knee ROM/ strength to improve pain-free mobility    Currently in Pain?  No/denies          Thera Ex.  NuStep seat #7 10 minL6 (warm-up)- no rest breaks.  TRX sit to stand 10x2 (chair landmark). Standing 5# LE ex. Program (highmarching/lateral walking/toe andheel raises/ knee flexion/ LAQ/ walking in PT clinic with no assistive device). 20x each. Cuing to improve posture.  Step ups/ down at stairs (focus on quad control).  Step touches with no UE assist (recip. Pattern/ increase cadence) Walking in hallway with cuing to correct lateral leaning during gait Walking lunges in //-bars 3x Discussed home walking program to improve endurance   PT Long Term Goals - 07/27/19 1629      PT LONG TERM GOAL #1   Title  Pt. will increase FOTO to 53 to improve functional mobility.    Baseline  Initial FOTO: 31.  2/2: 47 (marked increase).  3/4: 63    Time  4    Period  Weeks    Status  Achieved    Target Date  07/22/19      PT LONG TERM GOAL #2   Title  Pt. will increase R knee AROM (0 to >110 deg.) to improve standing/walking.    Baseline  L knee AROM: 0 to 111 deg. R knee AROM: -4 to 84 deg. R knee ROM flexion 88 deg.  2/2: R knee 0 to 101 deg. after manual stretches.  3/4: 110 deg. after manual stretches.    Time  4    Period  Weeks    Status  Partially Met    Target Date  08/19/19      PT LONG TERM GOAL #3   Title  Pt. independent with HEP to increase LE strength 1/2 muscle grade to improve standing tolerance/ return to work.    Baseline  L LE strength grossly 5/5 MMT except  hip flexion/ abduction 4+/5 MMT. R LE strength grossly 4+/5 MMT except hip flexion 4/5 MMT.  2/2: B hip flexion 4/5 MMT and B quad/hamstring 5/5 MMT.    Time  4    Period  Weeks    Status  Partially Met    Target Date  08/19/19      PT LONG TERM GOAL #4   Title  Pt. will ambulate with consistent 2-point gait pattern with use of SPC on inside/ outside surfaces to improve mobility.    Baseline  Moderate R antalgic gait pattern with use of RW.  3/4: pt. ambulates without assistive device.  Cuing to correct lateral sway/ leaning.    Time  4    Period  Weeks    Status  Partially Met    Target Date  08/19/19            Plan - 07/31/19 1233    Clinical Impression Statement  Pt. presents with good R knee AROM (0 to 110 deg.) with no c/o pain.  Pt. progressing with LE strengthening to improve standing tolerance.  Generalized muscle fatigue is still noted but pt. recovers quickly with seated rest break.  Pt. encouraged to be more active at home with ex. program/ daily walking outside.  Pt. will be able to return to work once cleared by MD next week.    Examination-Activity Limitations  Bathing;Stairs;Sleep;Squat;Lift;Toileting;Stand    Examination-Participation Restrictions  Driving    Stability/Clinical Decision Making  Evolving/Moderate complexity    Clinical Decision Making  Moderate    Rehab Potential  Good    PT Frequency  2x / week    PT Duration  4 weeks    PT Treatment/Interventions  ADLs/Self Care Home Management;Aquatic Therapy;Cryotherapy;Moist Heat;Electrical Stimulation;Gait training;Stair training;Functional mobility training;Neuromuscular re-education;Balance training;Therapeutic exercise;Therapeutic activities;Patient/family education;Manual techniques;Passive range of motion    PT Next Visit Plan  Increase B LE generalized strengthening/ standing endurance to improve return to work.    PT Home Exercise Plan  Access Code: [email or web address]    Consulted and Agree with Plan of Care   Patient       Patient will benefit from skilled therapeutic intervention in order to improve the following deficits and impairments:  Abnormal gait, Decreased balance, Decreased endurance, Decreased mobility, Difficulty walking, Hypomobility, Obesity, Improper body mechanics, Decreased range of motion, Decreased activity tolerance, Decreased strength, Impaired flexibility, Postural  dysfunction, Pain  Visit Diagnosis: Status post total right knee replacement  Joint stiffness of knee, right  Muscle weakness (generalized)  Gait difficulty  Acute pain of right knee     Problem List Patient Active Problem List   Diagnosis Date Noted  . Total knee replacement status 04/30/2019  . Primary osteoarthritis of right knee 02/13/2019  . S/P total knee arthroplasty, left 02/13/2019  . Aortic atherosclerosis ([place]) 08/01/2018  . B12 deficiency anemia 07/07/2018  . Benign neoplasm of ascending colon   . Positive colorectal cancer screening using Cologuard test 03/13/2018  . Severe persistent asthma 02/13/2018  . Vitamin D deficiency 03/17/2017  . Prediabetes 03/14/2017  . Microcytic anemia 03/14/2017  . IBS (irritable bowel syndrome) 03/12/2017  . Allergic rhinitis 03/12/2017  . Restless leg syndrome 03/12/2017  . Acid reflux 10/16/2016  . Essential hypertension 10/16/2016  . OSA on CPAP 12/08/2014  . BMI 50.0-59.9, adult ([place]) 05/31/2014   Pura Spice, PT, DPT # [phone] 07/31/2019, 12:37 PM  [place] [facility] [address] [place], [place], [address] Phone: [phone]   Fax:  [phone]  Name: Samantha Mason MRN: [id number] Date of Birth: 12-31-1950

## 2019-08-03 ENCOUNTER — Encounter: Payer: 59 | Admitting: Physical Therapy

## 2019-08-03 DIAGNOSIS — Z96651 Presence of right artificial knee joint: Secondary | ICD-10-CM | POA: Diagnosis not present

## 2019-08-03 DIAGNOSIS — Z6841 Body Mass Index (BMI) 40.0 and over, adult: Secondary | ICD-10-CM | POA: Diagnosis not present

## 2019-08-05 ENCOUNTER — Encounter: Payer: Self-pay | Admitting: Physical Therapy

## 2019-08-05 ENCOUNTER — Other Ambulatory Visit: Payer: Self-pay

## 2019-08-05 ENCOUNTER — Ambulatory Visit: Payer: 59 | Admitting: Physical Therapy

## 2019-08-05 DIAGNOSIS — M25661 Stiffness of right knee, not elsewhere classified: Secondary | ICD-10-CM

## 2019-08-05 DIAGNOSIS — Z96651 Presence of right artificial knee joint: Secondary | ICD-10-CM | POA: Diagnosis not present

## 2019-08-05 DIAGNOSIS — M25561 Pain in right knee: Secondary | ICD-10-CM | POA: Diagnosis not present

## 2019-08-05 DIAGNOSIS — R269 Unspecified abnormalities of gait and mobility: Secondary | ICD-10-CM | POA: Diagnosis not present

## 2019-08-05 DIAGNOSIS — M6281 Muscle weakness (generalized): Secondary | ICD-10-CM | POA: Diagnosis not present

## 2019-08-05 NOTE — Therapy (Signed)
[facility] [address] [place], [place], [address] Phone: [phone]   Fax:  [phone]  Physical Therapy Treatment  Patient Details  Name: Samantha Mason MRN: [id number] Date of Birth: June 28, 1950 Referring Provider (PT): Dr. Marry Guan   Encounter Date: 08/05/2019  PT End of Session - 08/05/19 1341    Visit Number  16    Number of Visits  21    Date for PT Re-Evaluation  08/19/19    Authorization - Visit Number  4    Authorization - Number of Visits  10    PT Start Time  8264    PT Stop Time  1447    PT Time Calculation (min)  51 min    Activity Tolerance  Patient tolerated treatment well;Patient limited by fatigue    Behavior During Therapy  [facility] for tasks assessed/performed       Past Medical History:  Diagnosis Date  . Acute bronchitis   . Anemia   . Anxiety and depression   . Arthritis   . Asthma   . B12 deficiency 03/14/2017  . Chest pain 07/02/2018  . COPD (chronic obstructive pulmonary disease) ([place]) 07/24/2018  . Family history of blood clots 07/16/2017  . Irritable bowel syndrome   . Malignant hypertension   . Obesity   . OSA (obstructive sleep apnea)    uses cpap  . Pneumonia 2017  . Restless leg syndrome   . Stroke [facility]) 1995   mild    Past Surgical History:  Procedure Laterality Date  . CHOLECYSTECTOMY    . COLONOSCOPY WITH PROPOFOL N/A 03/31/2018   Procedure: COLONOSCOPY WITH PROPOFOL;  Surgeon: Lucilla Lame, MD;  Location: [facility] ENDOSCOPY;  Service: Endoscopy;  Laterality: N/A;  . JOINT REPLACEMENT  2014  . KNEE ARTHROPLASTY Right 04/30/2019   Procedure: COMPUTER ASSISTED TOTAL KNEE ARTHROPLASTY;  Surgeon: Dereck Leep, MD;  Location: [facility] ORS;  Service: Orthopedics;  Laterality: Right;  . saliva gland removal  1998  . TOTAL KNEE ARTHROPLASTY Left 2014  . TUBAL LIGATION Bilateral     There were no vitals filed for this visit.  Subjective Assessment - 08/05/19 1341    Subjective  Pt. states  MD f/u went well and pt. is scheduled to return to work next Monday part-time for a week or 2 before returning to 2nd shift full-time.  Pt. reports no knee pain prior to PT tx. session.    Patient is accompained by:  Family member    Pertinent History  Pt. known to PT clinic.  See medical chart.    Limitations  Standing;Walking;House hold activities    Patient Stated Goals  Increase R knee ROM/ strength to improve pain-free mobility    Currently in Pain?  No/denies         Thera Ex.  NuStep seat #7 10 minL6 (warm-up)- no rest breaks. Walking lunges in //-bars 3x (UE assist required).   Step ups/ down at stairs (focus on quad control).  Step touches with no UE assist (recip. Pattern/ increase cadence). Standing5# LE ex. Program (highmarching/LAQ/ walking in //-bars and PT clinic with no assistive device).Supine SAQ 5#/ hip flexion 5# 10x2 each.  Bolster bridging 20x.   Walking in hallway with cuing to correct lateral leaning during gait Supine R knee flexion/ extension AROM reassessment (0-110 deg.).   Goal reassessment (all goals met) Discussed home walking program to improve endurance      PT Long Term Goals - 08/05/19 1557  PT LONG TERM GOAL #1   Title  Pt. will increase FOTO to 53 to improve functional mobility.    Baseline  Initial FOTO: 31.  2/2: 47 (marked increase).  3/4: 63    Time  4    Period  Weeks    Status  Achieved    Target Date  07/22/19      PT LONG TERM GOAL #2   Title  Pt. will increase R knee AROM (0 to >110 deg.) to improve standing/walking.    Baseline  L knee AROM: 0 to 111 deg. R knee AROM: -4 to 84 deg. R knee ROM flexion 88 deg.  2/2: R knee 0 to 101 deg. after manual stretches.  3/4: 110 deg. after manual stretches.  3/18: R knee AROM: 0-110 deg. after stretches.    Time  4    Period  Weeks    Status  Achieved    Target Date  08/05/19      PT LONG TERM GOAL #3   Title  Pt. independent with HEP to increase LE strength 1/2 muscle  grade to improve standing tolerance/ return to work.    Baseline  L LE strength grossly 5/5 MMT except hip flexion/ abduction 4+/5 MMT. R LE strength grossly 4+/5 MMT except hip flexion 4/5 MMT.  2/2: B hip flexion 4/5 MMT and B quad/hamstring 5/5 MMT.    Time  4    Period  Weeks    Status  Achieved    Target Date  08/05/19      PT LONG TERM GOAL #4   Title  Pt. will ambulate with consistent 2-point gait pattern with use of SPC on inside/ outside surfaces to improve mobility.    Baseline  Moderate R antalgic gait pattern with use of RW.  3/4: pt. ambulates without assistive device.  Cuing to correct lateral sway/ leaning.    Time  4    Period  Weeks    Status  Achieved    Target Date  08/05/19         Plan - 08/05/19 1342    Clinical Impression Statement  Pt. has progressed well with skilled PT services over past couple months.  Pt. has made significant progress over past month to promote return to work.  R knee AROM remains at 0-110 deg. flexion after stretching/ ther.ex.  Pt. tolerates increase standing/ therex. with LE resistance.  Pt. will continue to benefit from consistent HEP to maintain physical/ functional gains.  Pt. instructed to contact PT if any questions or issues once pt. returns to work.  Discharge from PT at this time.    Examination-Activity Limitations  Bathing;Stairs;Sleep;Squat;Lift;Toileting;Stand    Examination-Participation Restrictions  Driving    Stability/Clinical Decision Making  Evolving/Moderate complexity    Clinical Decision Making  Moderate    Rehab Potential  Good    PT Frequency  2x / week    PT Duration  4 weeks    PT Treatment/Interventions  ADLs/Self Care Home Management;Aquatic Therapy;Cryotherapy;Moist Heat;Electrical Stimulation;Gait training;Stair training;Functional mobility training;Neuromuscular re-education;Balance training;Therapeutic exercise;Therapeutic activities;Patient/family education;Manual techniques;Passive range of motion    PT  Next Visit Plan  Discharge.  Pt. instructed to contact PT if any questions or issues upon return to work next week.    PT Home Exercise Plan  Access Code: [email or web address]    Consulted and Agree with Plan of Care  Patient       Patient will benefit from skilled therapeutic intervention in order to improve the  following deficits and impairments:  Abnormal gait, Decreased balance, Decreased endurance, Decreased mobility, Difficulty walking, Hypomobility, Obesity, Improper body mechanics, Decreased range of motion, Decreased activity tolerance, Decreased strength, Impaired flexibility, Postural dysfunction, Pain  Visit Diagnosis: Status post total right knee replacement  Joint stiffness of knee, right  Muscle weakness (generalized)  Gait difficulty  Acute pain of right knee     Problem List Patient Active Problem List   Diagnosis Date Noted  . Total knee replacement status 04/30/2019  . Primary osteoarthritis of right knee 02/13/2019  . S/P total knee arthroplasty, left 02/13/2019  . Aortic atherosclerosis ([place]) 08/01/2018  . B12 deficiency anemia 07/07/2018  . Benign neoplasm of ascending colon   . Positive colorectal cancer screening using Cologuard test 03/13/2018  . Severe persistent asthma 02/13/2018  . Vitamin D deficiency 03/17/2017  . Prediabetes 03/14/2017  . Microcytic anemia 03/14/2017  . IBS (irritable bowel syndrome) 03/12/2017  . Allergic rhinitis 03/12/2017  . Restless leg syndrome 03/12/2017  . Acid reflux 10/16/2016  . Essential hypertension 10/16/2016  . OSA on CPAP 12/08/2014  . BMI 50.0-59.9, adult ([place]) 05/31/2014   Pura Spice, PT, DPT # [phone] 08/05/2019, 3:59 PM  [facility] [address] [place], [place], [address] Phone: [phone]   Fax:  [phone]  Name: MARTIZA SPETH MRN: [id number] Date of Birth: Oct 12, 1950

## 2019-08-17 ENCOUNTER — Ambulatory Visit: Payer: 59 | Admitting: Internal Medicine

## 2019-08-20 ENCOUNTER — Other Ambulatory Visit: Payer: Self-pay | Admitting: Internal Medicine

## 2019-08-20 DIAGNOSIS — I10 Essential (primary) hypertension: Secondary | ICD-10-CM

## 2019-10-19 ENCOUNTER — Other Ambulatory Visit: Payer: Self-pay | Admitting: Internal Medicine

## 2019-10-19 DIAGNOSIS — J441 Chronic obstructive pulmonary disease with (acute) exacerbation: Secondary | ICD-10-CM

## 2019-10-20 ENCOUNTER — Inpatient Hospital Stay: Payer: 59 | Admitting: Internal Medicine

## 2019-11-30 ENCOUNTER — Other Ambulatory Visit: Payer: Self-pay | Admitting: Internal Medicine

## 2019-11-30 DIAGNOSIS — K219 Gastro-esophageal reflux disease without esophagitis: Secondary | ICD-10-CM

## 2019-12-13 ENCOUNTER — Other Ambulatory Visit: Payer: Self-pay | Admitting: Internal Medicine

## 2019-12-14 ENCOUNTER — Other Ambulatory Visit: Payer: Self-pay | Admitting: Internal Medicine

## 2019-12-14 NOTE — Telephone Encounter (Signed)
Requested medication (s) are due for refill today:   Provider to decide  Requested medication (s) are on the active medication list:   Yes  Future visit scheduled:   Yes in 2 months with Army Melia   Last ordered: Non delegated refill   Requested Prescriptions  Pending Prescriptions Disp Refills   promethazine (PHENERGAN) 25 MG tablet [Pharmacy Med Name: PROMETHAZINE 25 MG TABLET 25 Tablet] 30 tablet 0    Sig: TAKE 1 TABLET BY MOUTH EVERY 6 HOURS AS NEEDED FOR NAUSEA OR VOMITING.      Not Delegated - Gastroenterology: Antiemetics Failed - 12/14/2019  7:41 AM      Failed - This refill cannot be delegated      Failed - Valid encounter within last 6 months    Recent Outpatient Visits           10 months ago Annual physical exam   [facility] Glean Hess, MD   11 months ago Essential hypertension   [facility] Glean Hess, MD   1 year ago Chronic obstructive pulmonary disease with acute exacerbation [facility])   [facility] Glean Hess, MD   1 year ago Essential hypertension   [facility] Glean Hess, MD   1 year ago Essential hypertension   [facility] Glean Hess, MD       Future Appointments             In 2 months Army Melia Jesse Sans, MD [facility]

## 2020-02-01 ENCOUNTER — Other Ambulatory Visit: Payer: Self-pay | Admitting: Internal Medicine

## 2020-02-01 DIAGNOSIS — G2581 Restless legs syndrome: Secondary | ICD-10-CM

## 2020-02-01 NOTE — Telephone Encounter (Signed)
Requested medication (s) are due for refill today: Yes  Requested medication (s) are on the active medication list: Yes  Last refill:  01/27/19  Future visit scheduled: Yes  Notes to clinic:  Prescription has expired.    Requested Prescriptions  Pending Prescriptions Disp Refills   rOPINIRole (REQUIP) 1 MG tablet [Pharmacy Med Name: rOPINIRole HCL 1 MG TABS 1 Tablet] 30 tablet 3    Sig: TAKE 1 TABLET (1 MG TOTAL) BY MOUTH AT BEDTIME.      Neurology:  Parkinsonian Agents Failed - 02/01/2020  7:35 AM      Failed - Last BP in normal range    BP Readings from Last 1 Encounters:  05/07/19 (!) 147/62          Passed - Valid encounter within last 12 months    Recent Outpatient Visits           11 months ago Annual physical exam   [facility] Glean Hess, MD   1 year ago Essential hypertension   [facility] Glean Hess, MD   1 year ago Chronic obstructive pulmonary disease with acute exacerbation [facility])   [facility] Glean Hess, MD   1 year ago Essential hypertension   [facility] Glean Hess, MD   1 year ago Essential hypertension   [facility] Glean Hess, MD       Future Appointments             In 1 week Parrett, Fonnie Mu, NP [facility]   In 2 weeks Army Melia, Jesse Sans, MD [facility], [facility]

## 2020-02-10 ENCOUNTER — Ambulatory Visit
Admission: RE | Admit: 2020-02-10 | Discharge: 2020-02-10 | Disposition: A | Discharge status: Home / Self Care | Payer: 59 | Attending: Adult Health | Admitting: Adult Health

## 2020-02-10 ENCOUNTER — Other Ambulatory Visit
Admission: RE | Admit: 2020-02-10 | Discharge: 2020-02-10 | Disposition: A | Discharge status: Home / Self Care | Payer: 59 | Source: Home / Self Care | Attending: Adult Health | Admitting: Adult Health

## 2020-02-10 ENCOUNTER — Ambulatory Visit: Payer: 59 | Admitting: Adult Health

## 2020-02-10 ENCOUNTER — Other Ambulatory Visit: Payer: Self-pay

## 2020-02-10 ENCOUNTER — Ambulatory Visit
Admission: RE | Admit: 2020-02-10 | Discharge: 2020-02-10 | Disposition: A | Discharge status: Home / Self Care | Payer: 59 | Source: Ambulatory Visit | Attending: Adult Health | Admitting: Adult Health

## 2020-02-10 ENCOUNTER — Encounter: Payer: Self-pay | Admitting: Adult Health

## 2020-02-10 VITALS — BP 144/70 | HR 100 | Temp 97.7°F | Ht 64.0 in | Wt 304.8 lb

## 2020-02-10 DIAGNOSIS — R0602 Shortness of breath: Secondary | ICD-10-CM

## 2020-02-10 DIAGNOSIS — J4550 Severe persistent asthma, uncomplicated: Secondary | ICD-10-CM

## 2020-02-10 DIAGNOSIS — G4733 Obstructive sleep apnea (adult) (pediatric): Secondary | ICD-10-CM

## 2020-02-10 DIAGNOSIS — J90 Pleural effusion, not elsewhere classified: Secondary | ICD-10-CM | POA: Diagnosis not present

## 2020-02-10 DIAGNOSIS — J45909 Unspecified asthma, uncomplicated: Secondary | ICD-10-CM | POA: Diagnosis not present

## 2020-02-10 DIAGNOSIS — Z9989 Dependence on other enabling machines and devices: Secondary | ICD-10-CM

## 2020-02-10 LAB — CBC WITH DIFFERENTIAL/PLATELET
Abs Immature Granulocytes: 0.03 10*3/uL (ref 0.00–0.07)
Basophils Absolute: 0.1 10*3/uL (ref 0.0–0.1)
Basophils Relative: 1 %
Eosinophils Absolute: 0.3 10*3/uL (ref 0.0–0.5)
Eosinophils Relative: 3 %
HCT: 33.7 % — ABNORMAL LOW (ref 36.0–46.0)
Hemoglobin: 10.5 g/dL — ABNORMAL LOW (ref 12.0–15.0)
Immature Granulocytes: 0 %
Lymphocytes Relative: 19 %
Lymphs Abs: 1.8 10*3/uL (ref 0.7–4.0)
MCH: 25.6 pg — ABNORMAL LOW (ref 26.0–34.0)
MCHC: 31.2 g/dL (ref 30.0–36.0)
MCV: 82.2 fL (ref 80.0–100.0)
Monocytes Absolute: 0.6 10*3/uL (ref 0.1–1.0)
Monocytes Relative: 6 %
Neutro Abs: 6.7 10*3/uL (ref 1.7–7.7)
Neutrophils Relative %: 71 %
Platelets: 300 10*3/uL (ref 150–400)
RBC: 4.1 MIL/uL (ref 3.87–5.11)
RDW: 15.2 % (ref 11.5–15.5)
WBC: 9.5 10*3/uL (ref 4.0–10.5)
nRBC: 0 % (ref 0.0–0.2)

## 2020-02-10 LAB — BASIC METABOLIC PANEL
Anion gap: 11 (ref 5–15)
BUN: 33 mg/dL — ABNORMAL HIGH (ref 8–23)
CO2: 23 mmol/L (ref 22–32)
Calcium: 9.3 mg/dL (ref 8.9–10.3)
Chloride: 104 mmol/L (ref 98–111)
Creatinine, Ser: 1.31 mg/dL — ABNORMAL HIGH (ref 0.44–1.00)
GFR calc Af Amer: 49 mL/min — ABNORMAL LOW (ref >60)
GFR calc non Af Amer: 42 mL/min — ABNORMAL LOW (ref >60)
Glucose, Bld: 100 mg/dL — ABNORMAL HIGH (ref 70–99)
Potassium: 4.7 mmol/L (ref 3.5–5.1)
Sodium: 138 mmol/L (ref 135–145)

## 2020-02-10 LAB — BRAIN NATRIURETIC PEPTIDE: B Natriuretic Peptide: 29.5 pg/mL (ref 0.0–100.0)

## 2020-02-10 NOTE — Progress Notes (Signed)
$'@Patient'V$  ID: Samantha Mason, female    DOB: 1951-02-18, 69 y.o.   MRN: [id number]  Chief Complaint  Patient presents with  . Follow-up    OSA     Referring provider: Glean Hess, MD  HPI: 70 year old female followed for  asthma and obstructive sleep apnea and restless leg syndrome  TEST/EVENTS :    **ABG 07/25/2018>> 7.47/40 1/80 3/29.8.  **CT chest 07/02/2018>> I  lung volumes are reduced consistent with restrictive lung disease from body habitus  **Chest x-ray 07/24/2018>>  increased lung markings suggestive of chronic bronchitis, hyperinflation suggestive of emphysema.  CT chest 04/2019 -Neg PE, no acute process.    02/10/2020 Follow up : OSA , Asthma , RLS  Patient presents for 1 year follow-up.  Patient has underlying obstructive sleep apnea.  She is on nocturnal CPAP. Says she wears all night for ~7 hrs. Sleeps well with it but is always tired.   Patient has asthma says overall breathing has been doing okay.   She remains on Symbicort twice daily.  Of note patient is on ACE inhibitor. Does have cough , worse with eating .  Noticed breathing has not been as good, gets winded with minimal activity , especially if she bends over. More albuterol inhaler use over last 2 weeks. Says Asthma not as good in the fall .  Weight is up 10 pounds.  Denies any increased leg swelling.  No orthopnea.No record of PFTs.   Patient has restless leg syndrome.  She is on Requip at bedtime. Controls symptoms well.   Works partime , second shift.     Allergies  Allergen Reactions  . Nuvigil [Armodafinil] Hives  . Penicillins Diarrhea and Nausea And Vomiting    Did it involve swelling of the face/tongue/throat, SOB, or low BP? no Did it involve sudden or severe rash/hives, skin peeling, or any reaction on the inside of your mouth or nose? No Did you need to seek medical attention at a hospital or doctor's office? No When did it last happen?in her 30s If all above answers are "NO",  may proceed with cephalosporin use.   . Gabapentin Other (See Comments)    "wiped her out, couldn't stay awake"  . Provigil [Modafinil] Hives    Immunization History  Administered Date(s) Administered  . Fluad Quad(high Dose 65+) 02/15/2019  . Influenza Split 02/23/2018  . Influenza-Unspecified 02/16/2014, 01/18/2017  . Moderna SARS-COVID-2 Vaccination 01/05/2020, 02/01/2020  . Pneumococcal Conjugate-13 05/16/2014  . Pneumococcal Polysaccharide-23 03/12/2017, 07/16/2017    Past Medical History:  Diagnosis Date  . Acute bronchitis   . Anemia   . Anxiety and depression   . Arthritis   . Asthma   . B12 deficiency 03/14/2017  . Chest pain 07/02/2018  . COPD (chronic obstructive pulmonary disease) ([place]) 07/24/2018  . Family history of blood clots 07/16/2017  . Irritable bowel syndrome   . Malignant hypertension   . Obesity   . OSA (obstructive sleep apnea)    uses cpap  . Pneumonia 2017  . Restless leg syndrome   . Stroke [facility]) 1995   mild    Tobacco History: Social History   Tobacco Use  Smoking Status Never Smoker  Smokeless Tobacco Never Used   Counseling given: Not Answered   Outpatient Medications Prior to Visit  Medication Sig Dispense Refill  . acetaminophen (TYLENOL) 500 MG tablet Take 2 tablets (1,000 mg total) by mouth every 8 (eight) hours as needed for moderate pain. 30 tablet 0  .  albuterol (ACCUNEB) 1.25 MG/3ML nebulizer solution Take 1 ampule by nebulization every 6 (six) hours as needed for wheezing.    . cetirizine (ZYRTEC) 10 MG tablet Take 1 tablet (10 mg total) by mouth daily. (Patient taking differently: Take 10 mg by mouth every morning. ) 30 tablet 2  . cholestyramine (QUESTRAN) 4 GM/DOSE powder Take 1 packet (4 g total) by mouth at bedtime. 378 g 5  . diphenoxylate-atropine (LOMOTIL) 2.5-0.025 MG tablet Take 1 tablet by mouth 3 (three) times daily as needed for diarrhea or loose stools. 30 tablet 0  . hydrochlorothiazide (HYDRODIURIL) 25 MG  tablet TAKE 1 TABLET BY MOUTH DAILY. 90 tablet 1  . lisinopril (ZESTRIL) 10 MG tablet TAKE 1 TABLET BY MOUTH DAILY. 90 tablet 3  . pantoprazole (PROTONIX) 40 MG tablet TAKE 1 TABLET BY MOUTH DAILY 90 tablet 1  . promethazine (PHENERGAN) 25 MG tablet TAKE 1 TABLET BY MOUTH EVERY 6 HOURS AS NEEDED FOR NAUSEA OR VOMITING. 30 tablet 0  . rOPINIRole (REQUIP) 1 MG tablet Take 1 tablet (1 mg total) by mouth at bedtime. And prn throughout the day if needed 30 tablet 0  . SYMBICORT 160-4.5 MCG/ACT inhaler INHALE 2 PUFFS INTO THE LUNGS 2 (TWO) TIMES DAILY. 10.2 g 3  . traMADol (ULTRAM) 50 MG tablet Take 1-2 tablets (50-100 mg total) by mouth every 4 (four) hours as needed for moderate pain. 40 tablet 0  . vitamin B-12 (CYANOCOBALAMIN) 1000 MCG tablet Take 1,000 mcg by mouth daily.    . Vitamin D, Ergocalciferol, (DRISDOL) 1.25 MG (50000 UT) CAPS capsule Take 1 capsule (50,000 Units total) by mouth every 7 (seven) days. 12 capsule 3  . modafinil (PROVIGIL) 200 MG tablet Take 1 tablet (200 mg total) by mouth daily as needed. 10 tablet 0  . predniSONE (STERAPRED UNI-PAK 21 TAB) 10 MG (21) TBPK tablet Per packaging instructions 21 tablet 0  . oxyCODONE (OXY IR/ROXICODONE) 5 MG immediate release tablet Take 1-2 tablets (5-10 mg total) by mouth every 4 (four) hours as needed for moderate pain (pain score 4-6). (Patient not taking: Reported on 02/10/2020) 40 tablet 0  . enoxaparin (LOVENOX) 40 MG/0.4ML injection Inject 0.4 mLs (40 mg total) into the skin daily for 14 days. 5.6 mL 0   No facility-administered medications prior to visit.     Review of Systems:   Constitutional:   No  weight loss, night sweats,  Fevers, chills,  +fatigue, or  lassitude.  HEENT:   No headaches,  Difficulty swallowing,  Tooth/dental problems, or  Sore throat,                No sneezing, itching, ear ache, nasal congestion, post nasal drip,   CV:  No chest pain,  Orthopnea, PND, swelling in lower extremities, anasarca, dizziness,  palpitations, syncope.   GI  No heartburn, indigestion, abdominal pain, nausea, vomiting, diarrhea, change in bowel habits, loss of appetite, bloody stools.   Resp:    No chest wall deformity  Skin: no rash or lesions.  GU: no dysuria, change in color of urine, no urgency or frequency.  No flank pain, no hematuria   MS:  No joint pain or swelling.  No decreased range of motion.  No back pain.    Physical Exam  BP (!) 144/70 (BP Location: Left Arm, Cuff Size: Normal)   Pulse 100   Temp 97.7 F (36.5 C) (Temporal)   Ht $R'5\' 4"'Ki$  (1.626 m)   Wt (!) 304 lb 12.8 oz (138.3  kg)   SpO2 95%   BMI 52.32 kg/m   GEN: A/Ox3; pleasant , NAD, well nourished    HEENT:  [place]/AT,   NOSE-clear, THROAT-clear, no lesions, no postnasal drip or exudate noted.  Class III MP airway  NECK:  Supple w/ fair ROM; no JVD; normal carotid impulses w/o bruits; no thyromegaly or nodules palpated; no lymphadenopathy.    RESP  Clear  P & A; w/o, wheezes/ rales/ or rhonchi. no accessory muscle use, no dullness to percussion  CARD:  RRR, no m/r/g, tr  peripheral edema, pulses intact, no cyanosis or clubbing.  GI:   Soft & nt; nml bowel sounds; no organomegaly or masses detected.   Musco: Warm bil, no deformities or joint swelling noted.   Neuro: alert, no focal deficits noted.    Skin: Warm, no lesions or rashes    Lab Results:  CBC    Component Value Date/Time   WBC 9.2 05/07/2019 1208   RBC 3.65 (L) 05/07/2019 1208   HGB 9.6 (L) 05/07/2019 1208   HGB 10.2 (L) 04/20/2014 1610   HCT 29.7 (L) 05/07/2019 1208   HCT 33.3 (L) 04/20/2014 1610   PLT 366 05/07/2019 1208   PLT 267 04/20/2014 1610   MCV 81.4 05/07/2019 1208   MCV 76 (L) 04/20/2014 1610   MCH 26.3 05/07/2019 1208   MCHC 32.3 05/07/2019 1208   RDW 15.7 (H) 05/07/2019 1208   RDW 17.4 (H) 04/20/2014 1610   LYMPHSABS 1.9 01/11/2019 1453   LYMPHSABS 1.6 04/20/2014 1610   MONOABS 0.6 01/11/2019 1453   MONOABS 0.4 04/20/2014 1610   EOSABS  0.3 01/11/2019 1453   EOSABS 0.2 04/20/2014 1610   BASOSABS 0.1 01/11/2019 1453   BASOSABS 0.0 04/20/2014 1610    BMET    Component Value Date/Time   NA 140 05/07/2019 1208   NA 141 05/25/2014 1407   K 4.3 05/07/2019 1208   K 3.6 05/25/2014 1407   CL 105 05/07/2019 1208   CL 106 05/25/2014 1407   CO2 27 05/07/2019 1208   CO2 28 05/25/2014 1407   GLUCOSE 112 (H) 05/07/2019 1208   GLUCOSE 86 05/25/2014 1407   BUN 17 05/07/2019 1208   BUN 15 05/25/2014 1407   CREATININE 1.36 (H) 05/07/2019 1208   CREATININE 1.24 05/25/2014 1407   CALCIUM 9.3 05/07/2019 1208   CALCIUM 8.6 05/25/2014 1407   GFRNONAA 40 (L) 05/07/2019 1208   GFRNONAA 47 (L) 05/25/2014 1407   GFRNONAA 47 (L) 09/26/2013 0325   GFRAA 47 (L) 05/07/2019 1208   GFRAA 56 (L) 05/25/2014 1407   GFRAA 54 (L) 09/26/2013 0325    BNP    Component Value Date/Time   BNP 27.0 01/11/2019 1453    ProBNP No results found for: PROBNP  Imaging: No results found.    No flowsheet data found.  No results found for: NITRICOXIDE      Assessment & Plan:   OSA on CPAP Controlled on current CPAP.  Unable to get CPAP download.  Reached out to DME but unable to get due to age of machine  Needs new CPAP machine  Plan  Patient Instructions  Continue on CPAP at bedtime. We will order a new CPAP machine. Continue to wear all night long Work on healthy weight Do not drive a sleepy Continue on Requip at bedtime  Continue on Symbicort 2 puffs twice daily Albuterol inhaler as needed Would discuss with Primary MD that Lisinopril may be aggravating your cough /wheezing .  Chest  x-ray today Labs today Follow-up in 6 weeks with Dr. Mortimer Fries with PFTs and As needed    Please contact office for sooner follow up if symptoms do not improve or worsen or seek emergency care       Severe persistent asthma Moderate persistent asthma-patient has pretty significant burden questionable etiology may be other things going on.  We  will check chest x-ray labs with CBC be met and BNP.  Also will check PFTs.  For now continue on Symbicort have close follow-up and reevaluate once PFTs are completed Also has daily cough would consider changing ACE inhibitor if possible  Plan  Patient Instructions  Continue on CPAP at bedtime. We will order a new CPAP machine. Continue to wear all night long Work on healthy weight Do not drive a sleepy Continue on Requip at bedtime  Continue on Symbicort 2 puffs twice daily Albuterol inhaler as needed Would discuss with Primary MD that Lisinopril may be aggravating your cough /wheezing .  Chest x-ray today Labs today Follow-up in 6 weeks with Dr. Mortimer Fries with PFTs and As needed    Please contact office for sooner follow up if symptoms do not improve or worsen or seek emergency care          Rexene Edison, NP 02/10/2020

## 2020-02-10 NOTE — Addendum Note (Signed)
Addended by: Santiago Bur on: 02/10/2020 03:54 PM   Modules accepted: Orders

## 2020-02-10 NOTE — Assessment & Plan Note (Signed)
Controlled on current CPAP.  Unable to get CPAP download.  Reached out to DME but unable to get due to age of machine  Needs new CPAP machine  Plan  Patient Instructions  Continue on CPAP at bedtime. We will order a new CPAP machine. Continue to wear all night long Work on healthy weight Do not drive a sleepy Continue on Requip at bedtime  Continue on Symbicort 2 puffs twice daily Albuterol inhaler as needed Would discuss with Primary MD that Lisinopril may be aggravating your cough /wheezing .  Chest x-ray today Labs today Follow-up in 6 weeks with Dr. Mortimer Fries with PFTs and As needed    Please contact office for sooner follow up if symptoms do not improve or worsen or seek emergency care

## 2020-02-10 NOTE — Assessment & Plan Note (Signed)
Moderate persistent asthma-patient has pretty significant burden questionable etiology may be other things going on.  We will check chest x-ray labs with CBC be met and BNP.  Also will check PFTs.  For now continue on Symbicort have close follow-up and reevaluate once PFTs are completed Also has daily cough would consider changing ACE inhibitor if possible  Plan  Patient Instructions  Continue on CPAP at bedtime. We will order a new CPAP machine. Continue to wear all night long Work on healthy weight Do not drive a sleepy Continue on Requip at bedtime  Continue on Symbicort 2 puffs twice daily Albuterol inhaler as needed Would discuss with Primary MD that Lisinopril may be aggravating your cough /wheezing .  Chest x-ray today Labs today Follow-up in 6 weeks with Dr. Mortimer Mason with PFTs and As needed    Please contact office for sooner follow up if symptoms do not improve or worsen or seek emergency care

## 2020-02-10 NOTE — Patient Instructions (Addendum)
Continue on CPAP at bedtime. We will order a new CPAP machine. Continue to wear all night long Work on healthy weight Do not drive a sleepy Continue on Requip at bedtime  Continue on Symbicort 2 puffs twice daily Albuterol inhaler as needed Would discuss with Primary MD that Lisinopril may be aggravating your cough /wheezing .  Chest x-ray today Labs today Follow-up in 6 weeks with Dr. Mortimer Fries with PFTs and As needed    Please contact office for sooner follow up if symptoms do not improve or worsen or seek emergency care

## 2020-02-11 NOTE — Progress Notes (Signed)
Called and left message on voicemail to please return call for results. Contact number provided.

## 2020-02-15 ENCOUNTER — Telehealth: Payer: Self-pay | Admitting: Adult Health

## 2020-02-15 NOTE — Telephone Encounter (Signed)
ATC patient , per her DPR you can leave detailed message on home phone only.  I called home phone. Left message per TP recommendations to Chest Xray and Labs.  I will also send to normal results pool to send this in a letter as this is our 2nd attempt calling and no answer from patient. A MyChart message also sent to patient.

## 2020-02-15 NOTE — Progress Notes (Signed)
Date:  02/16/2020   Name:  Samantha Mason   DOB:  08-29-1950   MRN:  [id number]   Chief Complaint: Annual Exam (Breast Exam. No pap- aged out. ) and Flu Vaccine (Reg Dose. )  Samantha Mason is a 69 y.o. female who presents today for her Complete Annual Exam. She feels well. She reports exercising - none. She reports she is sleeping well. Breast complaints - none.  She has been having a bit more trouble breathing with SOB but no wheezing.  Seen by Pulmonary and had labs, CXR and planning breathing test and CPAP eval.  Mammogram: 03/2018 DEXA: none Pap smear: aged out Colonoscopy: 03/2018 (after positive cologuard)  Immunization History  Administered Date(s) Administered  . Fluad Quad(high Dose 65+) 02/15/2019  . Influenza Split 02/23/2018  . Influenza-Unspecified 02/16/2014, 01/18/2017  . Moderna SARS-COVID-2 Vaccination 01/05/2020, 02/01/2020  . Pneumococcal Conjugate-13 05/16/2014  . Pneumococcal Polysaccharide-23 03/12/2017, 07/16/2017    Hypertension This is a chronic problem. The problem is controlled. Associated symptoms include shortness of breath. Pertinent negatives include no chest pain, headaches or palpitations. Past treatments include ACE inhibitors and diuretics.  Diabetes She presents for her follow-up diabetic visit. Diabetes type: prediabetes. Pertinent negatives for hypoglycemia include no dizziness, headaches, nervousness/anxiousness or tremors. Pertinent negatives for diabetes include no chest pain, no fatigue, no polydipsia and no polyuria.  RLS - having more early evening sx now that she is home more.  Would like to increase the Requip to bid if possible.  Lab Results  Component Value Date   CREATININE 1.31 (H) 02/10/2020   BUN 33 (H) 02/10/2020   NA 138 02/10/2020   K 4.7 02/10/2020   CL 104 02/10/2020   CO2 23 02/10/2020   Lab Results  Component Value Date   CHOL 148 02/15/2019   HDL 57 02/15/2019   LDLCALC 76 02/15/2019   TRIG 77 02/15/2019     CHOLHDL 2.6 02/15/2019   Lab Results  Component Value Date   TSH 1.856 02/15/2019   Lab Results  Component Value Date   HGBA1C 6.1 (H) 04/19/2019   Lab Results  Component Value Date   WBC 9.5 02/10/2020   HGB 10.5 (L) 02/10/2020   HCT 33.7 (L) 02/10/2020   MCV 82.2 02/10/2020   PLT 300 02/10/2020   Lab Results  Component Value Date   ALT 18 05/07/2019   AST 16 05/07/2019   ALKPHOS 63 05/07/2019   BILITOT 0.6 05/07/2019     Review of Systems  Constitutional: Negative for chills, fatigue and fever.  HENT: Negative for congestion, hearing loss, tinnitus, trouble swallowing and voice change.   Eyes: Negative for visual disturbance.  Respiratory: Positive for shortness of breath. Negative for cough, chest tightness and wheezing.   Cardiovascular: Negative for chest pain, palpitations and leg swelling.  Gastrointestinal: Negative for abdominal pain, constipation, diarrhea and vomiting.  Endocrine: Negative for polydipsia and polyuria.  Genitourinary: Positive for frequency and urgency. Negative for dysuria, genital sores, vaginal bleeding and vaginal discharge.  Musculoskeletal: Positive for arthralgias. Negative for gait problem and joint swelling.  Skin: Negative for color change and rash.  Neurological: Negative for dizziness, tremors, light-headedness and headaches.  Hematological: Negative for adenopathy. Does not bruise/bleed easily.  Psychiatric/Behavioral: Negative for dysphoric mood and sleep disturbance. The patient is not nervous/anxious.     Patient Active Problem List   Diagnosis Date Noted  . Total knee replacement status 04/30/2019  . Primary osteoarthritis of right knee 02/13/2019  .  S/P total knee arthroplasty, left 02/13/2019  . Aortic atherosclerosis ([place]) 08/01/2018  . B12 deficiency anemia 07/07/2018  . Benign neoplasm of ascending colon   . Positive colorectal cancer screening using Cologuard test 03/13/2018  . Severe persistent asthma 02/13/2018   . Vitamin D deficiency 03/17/2017  . Prediabetes 03/14/2017  . Microcytic anemia 03/14/2017  . IBS (irritable bowel syndrome) 03/12/2017  . Allergic rhinitis 03/12/2017  . Restless leg syndrome 03/12/2017  . Acid reflux 10/16/2016  . Essential hypertension 10/16/2016  . OSA on CPAP 12/08/2014  . BMI 50.0-59.9, adult ([place]) 05/31/2014    Allergies  Allergen Reactions  . Nuvigil [Armodafinil] Hives  . Penicillins Diarrhea and Nausea And Vomiting    Did it involve swelling of the face/tongue/throat, SOB, or low BP? no Did it involve sudden or severe rash/hives, skin peeling, or any reaction on the inside of your mouth or nose? No Did you need to seek medical attention at a hospital or doctor's office? No When did it last happen?in her 27s If all above answers are "NO", may proceed with cephalosporin use.   . Gabapentin Other (See Comments)    "wiped her out, couldn't stay awake"  . Provigil [Modafinil] Hives    Past Surgical History:  Procedure Laterality Date  . CHOLECYSTECTOMY    . COLONOSCOPY WITH PROPOFOL N/A 03/31/2018   Procedure: COLONOSCOPY WITH PROPOFOL;  Surgeon: Lucilla Lame, MD;  Location: [facility] ENDOSCOPY;  Service: Endoscopy;  Laterality: N/A;  . KNEE ARTHROPLASTY Right 04/30/2019   Procedure: COMPUTER ASSISTED TOTAL KNEE ARTHROPLASTY;  Surgeon: Dereck Leep, MD;  Location: [facility] ORS;  Service: Orthopedics;  Laterality: Right;  . saliva gland removal  1998  . TOTAL KNEE ARTHROPLASTY Left 2014  . TUBAL LIGATION Bilateral     Social History   Tobacco Use  . Smoking status: Never Smoker  . Smokeless tobacco: Never Used  Vaping Use  . Vaping Use: Never used  Substance Use Topics  . Alcohol use: No    Alcohol/week: 0.0 standard drinks  . Drug use: No     Medication list has been reviewed and updated.  Current Meds  Medication Sig  . acetaminophen (TYLENOL) 500 MG tablet Take 2 tablets (1,000 mg total) by mouth every 8 (eight) hours as needed for  moderate pain.  Marland Kitchen albuterol (ACCUNEB) 1.25 MG/3ML nebulizer solution Take 1 ampule by nebulization every 6 (six) hours as needed for wheezing.  . cetirizine (ZYRTEC) 10 MG tablet Take 1 tablet (10 mg total) by mouth daily. (Patient taking differently: Take 10 mg by mouth every morning. )  . cholestyramine (QUESTRAN) 4 GM/DOSE powder Take 1 packet (4 g total) by mouth at bedtime.  . diphenoxylate-atropine (LOMOTIL) 2.5-0.025 MG tablet Take 1 tablet by mouth 3 (three) times daily as needed for diarrhea or loose stools.  Marland Kitchen lisinopril (ZESTRIL) 10 MG tablet TAKE 1 TABLET BY MOUTH DAILY.  . pantoprazole (PROTONIX) 40 MG tablet TAKE 1 TABLET BY MOUTH DAILY  . promethazine (PHENERGAN) 25 MG tablet TAKE 1 TABLET BY MOUTH EVERY 6 HOURS AS NEEDED FOR NAUSEA OR VOMITING.  . rOPINIRole (REQUIP) 1 MG tablet Take 1 tablet (1 mg total) by mouth in the morning and at bedtime. And prn throughout the day if needed  . SYMBICORT 160-4.5 MCG/ACT inhaler INHALE 2 PUFFS INTO THE LUNGS 2 (TWO) TIMES DAILY.  . [DISCONTINUED] rOPINIRole (REQUIP) 1 MG tablet Take 1 tablet (1 mg total) by mouth at bedtime. And prn throughout the day if needed  PHQ 2/9 Scores 02/16/2020 02/15/2019 01/13/2019 07/07/2018  PHQ - 2 Score 0 0 0 0  PHQ- 9 Score 0 - - -    GAD 7 : Generalized Anxiety Score 02/16/2020  Nervous, Anxious, on Edge 0  Control/stop worrying 0  Worry too much - different things 0  Trouble relaxing 0  Restless 0  Easily annoyed or irritable 0  Afraid - awful might happen 0  Total GAD 7 Score 0  Anxiety Difficulty Not difficult at all    BP Readings from Last 3 Encounters:  02/16/20 138/76  02/10/20 (!) 144/70  05/07/19 (!) 147/62    Physical Exam Vitals and nursing note reviewed.  Constitutional:      General: She is not in acute distress.    Appearance: She is well-developed. She is obese.  HENT:     Head: Normocephalic and atraumatic.     Right Ear: Tympanic membrane and ear canal normal.     Left  Ear: Tympanic membrane and ear canal normal.     Nose:     Right Sinus: No maxillary sinus tenderness.     Left Sinus: No maxillary sinus tenderness.  Eyes:     General: No scleral icterus.       Right eye: No discharge.        Left eye: No discharge.     Conjunctiva/sclera: Conjunctivae normal.  Neck:     Thyroid: No thyromegaly.     Vascular: No carotid bruit.  Cardiovascular:     Rate and Rhythm: Normal rate and regular rhythm.     Pulses: Normal pulses.     Heart sounds: Normal heart sounds.  Pulmonary:     Effort: Pulmonary effort is normal. No respiratory distress.     Breath sounds: No wheezing.  Chest:     Breasts:        Right: No mass, nipple discharge, skin change or tenderness.        Left: No mass, nipple discharge, skin change or tenderness.  Abdominal:     General: Bowel sounds are normal.     Palpations: Abdomen is soft.     Tenderness: There is no abdominal tenderness.  Musculoskeletal:     Cervical back: Normal range of motion. No erythema.     Right lower leg: No edema.     Left lower leg: No edema.  Lymphadenopathy:     Cervical: No cervical adenopathy.  Skin:    General: Skin is warm and dry.     Capillary Refill: Capillary refill takes less than 2 seconds.     Findings: No rash.  Neurological:     General: No focal deficit present.     Mental Status: She is alert and oriented to person, place, and time.     Cranial Nerves: No cranial nerve deficit.     Sensory: No sensory deficit.     Deep Tendon Reflexes: Reflexes are normal and symmetric.  Psychiatric:        Attention and Perception: Attention normal.        Mood and Affect: Mood normal.        Behavior: Behavior normal.     Wt Readings from Last 3 Encounters:  02/16/20 (!) 306 lb (138.8 kg)  02/10/20 (!) 304 lb 12.8 oz (138.3 kg)  05/07/19 296 lb 15.4 oz (134.7 kg)    BP 138/76 (BP Location: Right Arm, Patient Position: Sitting, Cuff Size: Large)   Pulse 90   Ht 5\' 4"  (1.626 m)  Wt  (!) 306 lb (138.8 kg)   SpO2 95%   BMI 52.52 kg/m   Assessment and Plan: 1. Annual physical exam Exam is normal except for weight. Encourage regular exercise and appropriate dietary changes. - Lipid panel - POCT urinalysis dipstick  2. Encounter for screening mammogram for breast cancer - MM 3D SCREEN BREAST BILATERAL; Future  3. Encounter for screening for osteoporosis - DG Bone Density; Future  4. Essential hypertension Clinically stable exam with well controlled BP. Tolerating medications without side effects at this time. Pt to continue current regimen and low sodium diet; benefits of regular exercise as able discussed. - Comprehensive metabolic panel - TSH  5. Prediabetes - Hemoglobin A1c  6. Other dietary vitamin B12 deficiency anemia - Vitamin B12  7. Vitamin D deficiency - VITAMIN D 25 Hydroxy (Vit-D Deficiency, Fractures)  8. Aortic atherosclerosis ([place]) Should be on statin therapy Will discuss when labs return  9. BMI 50.0-59.9, adult ([place]) Continue efforts at weight loss  10. Restless leg syndrome Increase to bid prn for improved control - rOPINIRole (REQUIP) 1 MG tablet; Take 1 tablet (1 mg total) by mouth in the morning and at bedtime. And prn throughout the day if needed  Dispense: 180 tablet; Refill: 1   Partially dictated using Editor, commissioning. Any errors are unintentional.  Halina Maidens, MD [facility]  02/16/2020

## 2020-02-16 ENCOUNTER — Other Ambulatory Visit: Payer: Self-pay

## 2020-02-16 ENCOUNTER — Other Ambulatory Visit: Payer: Self-pay | Admitting: Internal Medicine

## 2020-02-16 ENCOUNTER — Encounter: Payer: Self-pay | Admitting: Internal Medicine

## 2020-02-16 ENCOUNTER — Other Ambulatory Visit
Admission: RE | Admit: 2020-02-16 | Discharge: 2020-02-16 | Disposition: A | Discharge status: Home / Self Care | Payer: 59 | Attending: Internal Medicine | Admitting: Internal Medicine

## 2020-02-16 ENCOUNTER — Ambulatory Visit (INDEPENDENT_AMBULATORY_CARE_PROVIDER_SITE_OTHER): Payer: 59 | Admitting: Internal Medicine

## 2020-02-16 VITALS — BP 138/76 | HR 90 | Ht 64.0 in | Wt 306.0 lb

## 2020-02-16 DIAGNOSIS — E559 Vitamin D deficiency, unspecified: Secondary | ICD-10-CM

## 2020-02-16 DIAGNOSIS — I10 Essential (primary) hypertension: Secondary | ICD-10-CM

## 2020-02-16 DIAGNOSIS — R7303 Prediabetes: Secondary | ICD-10-CM

## 2020-02-16 DIAGNOSIS — Z13820 Encounter for screening for osteoporosis: Secondary | ICD-10-CM | POA: Diagnosis not present

## 2020-02-16 DIAGNOSIS — D513 Other dietary vitamin B12 deficiency anemia: Secondary | ICD-10-CM | POA: Diagnosis not present

## 2020-02-16 DIAGNOSIS — Z0000 Encounter for general adult medical examination without abnormal findings: Secondary | ICD-10-CM | POA: Insufficient documentation

## 2020-02-16 DIAGNOSIS — Z1231 Encounter for screening mammogram for malignant neoplasm of breast: Secondary | ICD-10-CM | POA: Diagnosis not present

## 2020-02-16 DIAGNOSIS — I700 Atherosclerosis of aorta: Secondary | ICD-10-CM

## 2020-02-16 DIAGNOSIS — Z6841 Body Mass Index (BMI) 40.0 and over, adult: Secondary | ICD-10-CM

## 2020-02-16 DIAGNOSIS — Z23 Encounter for immunization: Secondary | ICD-10-CM | POA: Diagnosis not present

## 2020-02-16 DIAGNOSIS — G2581 Restless legs syndrome: Secondary | ICD-10-CM | POA: Diagnosis not present

## 2020-02-16 LAB — HEMOGLOBIN A1C
Hgb A1c MFr Bld: 5.7 % — ABNORMAL HIGH (ref 4.8–5.6)
Mean Plasma Glucose: 116.89 mg/dL

## 2020-02-16 LAB — VITAMIN B12: Vitamin B-12: 324 pg/mL (ref 180–914)

## 2020-02-16 LAB — COMPREHENSIVE METABOLIC PANEL
ALT: 14 U/L (ref 0–44)
AST: 17 U/L (ref 15–41)
Albumin: 3.5 g/dL (ref 3.5–5.0)
Alkaline Phosphatase: 73 U/L (ref 38–126)
Anion gap: 10 (ref 5–15)
BUN: 35 mg/dL — ABNORMAL HIGH (ref 8–23)
CO2: 23 mmol/L (ref 22–32)
Calcium: 9 mg/dL (ref 8.9–10.3)
Chloride: 104 mmol/L (ref 98–111)
Creatinine, Ser: 1.25 mg/dL — ABNORMAL HIGH (ref 0.44–1.00)
GFR calc Af Amer: 52 mL/min — ABNORMAL LOW (ref >60)
GFR calc non Af Amer: 44 mL/min — ABNORMAL LOW (ref >60)
Glucose, Bld: 101 mg/dL — ABNORMAL HIGH (ref 70–99)
Potassium: 4.6 mmol/L (ref 3.5–5.1)
Sodium: 137 mmol/L (ref 135–145)
Total Bilirubin: 0.7 mg/dL (ref 0.3–1.2)
Total Protein: 7.5 g/dL (ref 6.5–8.1)

## 2020-02-16 LAB — POCT URINALYSIS DIPSTICK
Bilirubin, UA: NEGATIVE
Glucose, UA: NEGATIVE
Ketones, UA: NEGATIVE
Leukocytes, UA: NEGATIVE
Nitrite, UA: NEGATIVE
Protein, UA: NEGATIVE
Spec Grav, UA: 1.02 (ref 1.010–1.025)
Urobilinogen, UA: 0.2 E.U./dL
pH, UA: 5 (ref 5.0–8.0)

## 2020-02-16 LAB — LIPID PANEL
Cholesterol: 162 mg/dL (ref 0–200)
HDL: 64 mg/dL (ref >40)
LDL Cholesterol: 83 mg/dL (ref 0–99)
Total CHOL/HDL Ratio: 2.5 RATIO
Triglycerides: 75 mg/dL (ref <150)
VLDL: 15 mg/dL (ref 0–40)

## 2020-02-16 LAB — TSH: TSH: 1.582 u[IU]/mL (ref 0.350–4.500)

## 2020-02-16 LAB — VITAMIN D 25 HYDROXY (VIT D DEFICIENCY, FRACTURES): Vit D, 25-Hydroxy: 28.89 ng/mL — ABNORMAL LOW (ref 30–100)

## 2020-02-16 MED ORDER — ROPINIROLE HCL 1 MG PO TABS: 1.0000 mg | ORAL_TABLET | Freq: Two times a day (BID) | ORAL | 1 refills | Status: DC

## 2020-02-21 ENCOUNTER — Other Ambulatory Visit: Payer: Self-pay | Admitting: Internal Medicine

## 2020-02-21 DIAGNOSIS — E2839 Other primary ovarian failure: Secondary | ICD-10-CM | POA: Insufficient documentation

## 2020-02-22 DIAGNOSIS — H524 Presbyopia: Secondary | ICD-10-CM | POA: Diagnosis not present

## 2020-03-06 ENCOUNTER — Telehealth: Payer: Self-pay | Admitting: Internal Medicine

## 2020-03-06 NOTE — Telephone Encounter (Signed)
Lm to relay date/time of covid test prior PFT.  03/09/[facility]

## 2020-03-07 NOTE — Telephone Encounter (Signed)
Lm x2 for patient. Letter has been mailed to address on file per office protocol.   

## 2020-03-07 NOTE — Telephone Encounter (Signed)
Pt returning missed call. Informed of covid test/pft date/time. Nothing further needed.

## 2020-03-09 ENCOUNTER — Other Ambulatory Visit: Payer: Self-pay

## 2020-03-09 ENCOUNTER — Other Ambulatory Visit
Admission: RE | Admit: 2020-03-09 | Discharge: 2020-03-09 | Disposition: A | Discharge status: Home / Self Care | Payer: 59 | Source: Ambulatory Visit | Attending: Adult Health | Admitting: Adult Health

## 2020-03-09 DIAGNOSIS — Z01818 Encounter for other preprocedural examination: Secondary | ICD-10-CM | POA: Insufficient documentation

## 2020-03-09 DIAGNOSIS — Z20822 Contact with and (suspected) exposure to covid-19: Secondary | ICD-10-CM | POA: Insufficient documentation

## 2020-03-10 ENCOUNTER — Other Ambulatory Visit: Payer: Self-pay

## 2020-03-10 ENCOUNTER — Ambulatory Visit: Payer: 59 | Attending: Adult Health

## 2020-03-10 DIAGNOSIS — R0602 Shortness of breath: Secondary | ICD-10-CM | POA: Insufficient documentation

## 2020-03-10 DIAGNOSIS — J988 Other specified respiratory disorders: Secondary | ICD-10-CM | POA: Diagnosis not present

## 2020-03-10 DIAGNOSIS — J984 Other disorders of lung: Secondary | ICD-10-CM | POA: Diagnosis not present

## 2020-03-10 LAB — PULMONARY FUNCTION TEST ARMC ONLY
DL/VA % pred: 120 %
DL/VA: 5.01 ml/min/mmHg/L
DLCO unc % pred: 112 %
DLCO unc: 22.23 ml/min/mmHg
FEF 25-75 Post: 2.23 L/sec
FEF 25-75 Pre: 1.59 L/sec
FEF2575-%Change-Post: 40 %
FEF2575-%Pred-Post: 109 %
FEF2575-%Pred-Pre: 77 %
FEV1-%Change-Post: 7 %
FEV1-%Pred-Post: 68 %
FEV1-%Pred-Pre: 63 %
FEV1-Post: 1.61 L
FEV1-Pre: 1.49 L
FEV1FVC-%Change-Post: 0 %
FEV1FVC-%Pred-Pre: 107 %
FEV6-%Change-Post: 8 %
FEV6-%Pred-Post: 66 %
FEV6-%Pred-Pre: 61 %
FEV6-Post: 1.98 L
FEV6-Pre: 1.82 L
FEV6FVC-%Pred-Post: 104 %
FEV6FVC-%Pred-Pre: 104 %
FVC-%Change-Post: 8 %
FVC-%Pred-Post: 63 %
FVC-%Pred-Pre: 58 %
FVC-Post: 1.98 L
Post FEV1/FVC ratio: 81 %
Post FEV6/FVC ratio: 100 %
Pre FEV1/FVC ratio: 82 %
Pre FEV6/FVC Ratio: 100 %
RV % pred: 67 %
RV: 1.44 L
TLC % pred: 72 %
TLC: 3.66 L

## 2020-03-10 LAB — SARS CORONAVIRUS 2 (TAT 6-24 HRS): SARS Coronavirus 2: NEGATIVE

## 2020-03-17 NOTE — Progress Notes (Signed)
ATC x1, Left VM to return call.

## 2020-03-21 NOTE — Progress Notes (Signed)
LVM x2 to return call regarding PFT results.

## 2020-03-24 ENCOUNTER — Other Ambulatory Visit: Payer: Self-pay | Admitting: Internal Medicine

## 2020-03-24 ENCOUNTER — Ambulatory Visit (INDEPENDENT_AMBULATORY_CARE_PROVIDER_SITE_OTHER): Payer: 59 | Admitting: Internal Medicine

## 2020-03-24 ENCOUNTER — Encounter: Payer: Self-pay | Admitting: Internal Medicine

## 2020-03-24 ENCOUNTER — Other Ambulatory Visit: Payer: Self-pay

## 2020-03-24 VITALS — BP 144/78 | HR 95 | Temp 97.1°F | Ht 64.0 in | Wt 314.2 lb

## 2020-03-24 DIAGNOSIS — J449 Chronic obstructive pulmonary disease, unspecified: Secondary | ICD-10-CM

## 2020-03-24 DIAGNOSIS — J4520 Mild intermittent asthma, uncomplicated: Secondary | ICD-10-CM | POA: Diagnosis not present

## 2020-03-24 DIAGNOSIS — R0602 Shortness of breath: Secondary | ICD-10-CM | POA: Diagnosis not present

## 2020-03-24 MED ORDER — SPIRIVA RESPIMAT 1.25 MCG/ACT IN AERS: 2.0000 | INHALATION_SPRAY | RESPIRATORY_TRACT | 6 refills | Status: DC

## 2020-03-24 NOTE — Progress Notes (Signed)
[facility] Consultation      Date: 03/03/2019  MRN# [id number] Samantha Mason [birth year]  Referring Physician:  Dr. Verdell Carmine.   **CBC 07/24/18>> AEC 300.  **ABG 07/25/2018>> 7.47/40 1/80 3/29.8. **CT chest 07/02/2018>> images personally reviewed, lung volumes are reduced consistent with restrictive lung disease from body habitus **Chest x-ray 07/24/2018>>  increased lung markings suggestive of chronic bronchitis, hyperinflation suggestive of emphysema.   SYNOPSIS The patient is a 69 year old female she has a history of asthma admitted to the hospital from 3/6 to 3/8 for COPD exacerbation, treated with IV steroids and duo nebs, she was initially placed on BiPAP. She had previously seen Dr. Stevenson Clinch for asthma in 2016, noted to have triggers of pollen.  -history of restless leg syndrome on Requip, OSA on nuvigil. -Her medication list includes Symbicort, she was tried on dulera in the past but it made her throat sore.  -rinses mouth after use.  - She has 2 dogs, 1 sleeps in bed with her.  She has reflux controlled with protonix.  She has sinus drainage, controlled with zyrtec.   She uses cpap every night -she is not sleepy during the day, but she takes nuvigil when she is driving long distances.  She is taking requip which controls her RLS.   CC Follow-up asthma Follow-up sleep apnea   HPI:   Follow-up asthma Asthma seems to be well-controlled at this time No signs of exacerbation No indication for steroids at this time  No indication for antibiotics  No exacerbation at this time No evidence of heart failure at this time No evidence or signs of infection at this time No respiratory distress No fevers, chills, nausea, vomiting, diarrhea No evidence of lower extremity edema No evidence hemoptysis   Follow-up OSA Patient uses CPAP at night however she needs to get new machine Compliance report not available She takes Requip for restless leg  syndrome   Progressive shortness of breath and increased work of breathing over the last several months Patient complains of orthopnea Intermittent leg swelling Recommend cardiology referral for evaluation for cardiac dysfunction     PMHX:   Past Medical History:  Diagnosis Date  . Acute bronchitis   . Anemia   . Anxiety and depression   . Arthritis   . Asthma   . B12 deficiency 03/14/2017  . Chest pain 07/02/2018  . COPD (chronic obstructive pulmonary disease) ([place]) 07/24/2018  . Family history of blood clots 07/16/2017  . Irritable bowel syndrome   . Malignant hypertension   . Obesity   . OSA (obstructive sleep apnea)    uses cpap  . Pneumonia 2017  . Restless leg syndrome   . Stroke [facility]) 1995   mild   Surgical Hx:  Past Surgical History:  Procedure Laterality Date  . CHOLECYSTECTOMY    . COLONOSCOPY WITH PROPOFOL N/A 03/31/2018   Procedure: COLONOSCOPY WITH PROPOFOL;  Surgeon: Lucilla Lame, MD;  Location: [facility] ENDOSCOPY;  Service: Endoscopy;  Laterality: N/A;  . KNEE ARTHROPLASTY Right 04/30/2019   Procedure: COMPUTER ASSISTED TOTAL KNEE ARTHROPLASTY;  Surgeon: Dereck Leep, MD;  Location: [facility] ORS;  Service: Orthopedics;  Laterality: Right;  . saliva gland removal  1998  . TOTAL KNEE ARTHROPLASTY Left 2014  . TUBAL LIGATION Bilateral    Family Hx:  Family History  Problem Relation Age of Onset  . Dementia Mother   . COPD Father        57  . Diabetes Father   . Heart failure  Brother        2010   Social Hx:   Social History   Tobacco Use  . Smoking status: Never Smoker  . Smokeless tobacco: Never Used  Vaping Use  . Vaping Use: Never used  Substance Use Topics  . Alcohol use: No    Alcohol/week: 0.0 standard drinks  . Drug use: No   Medication:    Current Outpatient Medications:  .  acetaminophen (TYLENOL) 500 MG tablet, Take 2 tablets (1,000 mg total) by mouth every 8 (eight) hours as needed for moderate pain., Disp: 30 tablet, Rfl: 0 .   albuterol (ACCUNEB) 1.25 MG/3ML nebulizer solution, Take 1 ampule by nebulization every 6 (six) hours as needed for wheezing., Disp: , Rfl:  .  cetirizine (ZYRTEC) 10 MG tablet, Take 1 tablet (10 mg total) by mouth daily. (Patient taking differently: Take 10 mg by mouth every morning. ), Disp: 30 tablet, Rfl: 2 .  cholestyramine (QUESTRAN) 4 GM/DOSE powder, Take 1 packet (4 g total) by mouth at bedtime., Disp: 378 g, Rfl: 5 .  diphenoxylate-atropine (LOMOTIL) 2.5-0.025 MG tablet, Take 1 tablet by mouth 3 (three) times daily as needed for diarrhea or loose stools., Disp: 30 tablet, Rfl: 0 .  lisinopril (ZESTRIL) 10 MG tablet, TAKE 1 TABLET BY MOUTH DAILY., Disp: 90 tablet, Rfl: 3 .  pantoprazole (PROTONIX) 40 MG tablet, TAKE 1 TABLET BY MOUTH DAILY, Disp: 90 tablet, Rfl: 1 .  promethazine (PHENERGAN) 25 MG tablet, TAKE 1 TABLET BY MOUTH EVERY 6 HOURS AS NEEDED FOR NAUSEA OR VOMITING., Disp: 30 tablet, Rfl: 0 .  rOPINIRole (REQUIP) 1 MG tablet, Take 1 tablet (1 mg total) by mouth in the morning and at bedtime. And prn throughout the day if needed, Disp: 180 tablet, Rfl: 1 .  SYMBICORT 160-4.5 MCG/ACT inhaler, INHALE 2 PUFFS INTO THE LUNGS 2 (TWO) TIMES DAILY., Disp: 10.2 g, Rfl: 3   Allergies:  Nuvigil [armodafinil], Penicillins, Gabapentin, and Provigil [modafinil]    Review of Systems:  Gen:  Denies  fever, sweats, chills weight loss  HEENT: Denies blurred vision, double vision, ear pain, eye pain, hearing loss, nose bleeds, sore throat Cardiac:  No dizziness, chest pain or heaviness, chest tightness,edema, No JVD Resp:   No cough, -sputum production, +shortness of breath,+wheezing, -hemoptysis,  Gi: Denies swallowing difficulty, stomach pain, nausea or vomiting, diarrhea, constipation, bowel incontinence Other:  All other systems negative  BP (!) 144/78 (BP Location: Left Arm, Patient Position: Sitting, Cuff Size: Normal)   Pulse 95   Temp (!) 97.1 F (36.2 C) (Temporal)   Ht 5\' 4"   (1.626 m)   Wt (!) 314 lb 3.2 oz (142.5 kg)   SpO2 96%   BMI 53.93 kg/m    Physical Examination:   General Appearance: No distress  Neuro:without focal findings,  speech normal,  HEENT: PERRLA, EOM intact.   Pulmonary: normal breath sounds, No wheezing.  CardiovascularNormal S1,S2.  No m/r/g.   Abdomen: Benign, Soft, non-tender. Renal:  No costovertebral tenderness  GU:  Not performed at this time. Endoc: No evident thyromegaly Skin:   warm, no rashes, no ecchymosis  Extremities: normal, no cyanosis, clubbing. PSYCHIATRIC: Mood, affect within normal limits.   ALL OTHER ROS ARE NEGATIVE      Assessment and Plan:   Mild intermittent asthma Moderate Restrictive Lung disease Seems to be well-controlled at this time with Symbicort  Continues to take Zyrtec for allergic rhinitis  Avoid allergens  Add Spiriva Respimat 1.25 to her regimen  Progressive shortness of breath and work of breathing There is no active wheezing at this time however I do feel there may be a component of cardiac dysfunction especially in the setting of sleep apnea and orbit obesity Myoview scan September 2020 was within normal limits Recommend cardiology referral for reassessment  OSA Patient needs new machine Compliance report not available  Restless leg syndrome Seems to be well controlled with Requip  Obesity -recommend significant weight loss -recommend changing diet  Deconditioned state -Recommend increased daily activity and exercise   COVID-19 EDUCATION: The signs and symptoms of COVID-19 were discussed with the patient and how to seek care for testing.  The importance of social distancing was discussed today. Hand Washing Techniques and avoid touching face was advised.   Total time spent 37 minutes  MEDICATION ADJUSTMENTS/LABS AND TESTS ORDERED: Continue CPAP as prescribed, needs new machine  Continue Symbicort ADD SPIRIVA RESPIMAT 1.25  ALBUTEROL As needed  AVOID  allergens  Cardiology referral  for SOB    CURRENT MEDICATIONS REVIEWED AT LENGTH WITH PATIENT TODAY   Patient satisfied with Plan of action and management. All questions answered  Follow up in 1 year   Sarahlynn Cisnero Patricia Pesa, M.D.  [facility]  Medical Director [place] Director [facility] Cardio-Pulmonary Department

## 2020-03-24 NOTE — Patient Instructions (Addendum)
Continue CPAP as prescribed, needs new machine  Continue Symbicort ADD SPIRIVA RESPIMAT 1.25  ALBUTEROL As needed  AVOID allergens  Cardiology referral  for SOB

## 2020-03-30 NOTE — Progress Notes (Signed)
Patient had f/u appointment with Dr. Mortimer Fries on 03/24/2020.  Nothing further needed.

## 2020-04-05 ENCOUNTER — Other Ambulatory Visit: Payer: 59

## 2020-04-10 ENCOUNTER — Telehealth: Payer: Self-pay | Admitting: Adult Health

## 2020-04-10 DIAGNOSIS — Z9989 Dependence on other enabling machines and devices: Secondary | ICD-10-CM

## 2020-04-10 DIAGNOSIS — G4733 Obstructive sleep apnea (adult) (pediatric): Secondary | ICD-10-CM

## 2020-04-11 NOTE — Telephone Encounter (Signed)
Called and spoke to patient, who is requesting order for generic cpap machine.  Patient stated that order states resmed and these machines are currently on backorder.   Dr. Mortimer Fries, please advise. thanks

## 2020-04-12 NOTE — Telephone Encounter (Signed)
Proceed as requested

## 2020-04-12 NOTE — Telephone Encounter (Signed)
Order has been placed to [facility] for new cpap machine.  Patient is aware and voiced her understanding.  Nothing further needed.

## 2020-04-28 ENCOUNTER — Other Ambulatory Visit: Payer: Self-pay | Admitting: Internal Medicine

## 2020-04-28 DIAGNOSIS — J441 Chronic obstructive pulmonary disease with (acute) exacerbation: Secondary | ICD-10-CM

## 2020-05-24 ENCOUNTER — Other Ambulatory Visit: Payer: Self-pay | Admitting: Internal Medicine

## 2020-05-30 ENCOUNTER — Ambulatory Visit
Admission: RE | Admit: 2020-05-30 | Discharge: 2020-05-30 | Disposition: A | Discharge status: Home / Self Care | Payer: 59 | Source: Ambulatory Visit | Attending: Internal Medicine | Admitting: Internal Medicine

## 2020-05-30 ENCOUNTER — Other Ambulatory Visit: Payer: Self-pay

## 2020-05-30 DIAGNOSIS — Z1231 Encounter for screening mammogram for malignant neoplasm of breast: Secondary | ICD-10-CM | POA: Insufficient documentation

## 2020-05-30 DIAGNOSIS — E2839 Other primary ovarian failure: Secondary | ICD-10-CM | POA: Insufficient documentation

## 2020-05-30 DIAGNOSIS — Z780 Asymptomatic menopausal state: Secondary | ICD-10-CM | POA: Diagnosis not present

## 2020-05-30 DIAGNOSIS — M85851 Other specified disorders of bone density and structure, right thigh: Secondary | ICD-10-CM | POA: Diagnosis not present

## 2020-05-31 ENCOUNTER — Ambulatory Visit: Payer: Self-pay | Admitting: *Deleted

## 2020-05-31 NOTE — Telephone Encounter (Signed)
Spoke to pt. Went over labs results and mammogram results. Pt verbalized understanding. Told pt she should be getting results from mamm. Mailed to her.  KP

## 2020-05-31 NOTE — Telephone Encounter (Signed)
Patient calling to receive bone density results and mammogram results. No information noted from Dr. Army Melia at this time. Reviewed with patient she would receive a call back when information from PCP in chart. Patient verbalized understanding .

## 2020-06-09 ENCOUNTER — Other Ambulatory Visit: Payer: Self-pay | Admitting: Internal Medicine

## 2020-06-09 DIAGNOSIS — K219 Gastro-esophageal reflux disease without esophagitis: Secondary | ICD-10-CM

## 2020-07-03 DIAGNOSIS — D2372 Other benign neoplasm of skin of left lower limb, including hip: Secondary | ICD-10-CM | POA: Diagnosis not present

## 2020-07-03 DIAGNOSIS — M79672 Pain in left foot: Secondary | ICD-10-CM | POA: Diagnosis not present

## 2020-07-03 DIAGNOSIS — M19072 Primary osteoarthritis, left ankle and foot: Secondary | ICD-10-CM | POA: Diagnosis not present

## 2020-07-12 ENCOUNTER — Other Ambulatory Visit: Payer: Self-pay | Admitting: Internal Medicine

## 2020-07-12 DIAGNOSIS — I10 Essential (primary) hypertension: Secondary | ICD-10-CM

## 2020-08-02 ENCOUNTER — Ambulatory Visit: Payer: 59 | Admitting: Internal Medicine

## 2020-08-16 ENCOUNTER — Other Ambulatory Visit: Payer: Self-pay | Admitting: Internal Medicine

## 2020-08-16 DIAGNOSIS — G2581 Restless legs syndrome: Secondary | ICD-10-CM

## 2020-08-16 NOTE — Telephone Encounter (Signed)
Requested medication (s) are due for refill today - yes  Requested medication (s) are on the active medication list - yes  Future visit scheduled -yes  Last refill: 06/13/20  Notes to clinic: Request non delegated Rx  Requested Prescriptions  Pending Prescriptions Disp Refills   promethazine (PHENERGAN) 25 MG tablet [Pharmacy Med Name: PROMETHAZINE 25 MG TABLET 25 Tablet] 30 tablet 0    Sig: TAKE 1 TABLET BY MOUTH EVERY 6 HOURS AS NEEDED FOR NAUSEA OR VOMITING.      Not Delegated - Gastroenterology: Antiemetics Failed - 08/16/2020 12:11 PM      Failed - This refill cannot be delegated      Failed - Valid encounter within last 6 months    Recent Outpatient Visits           6 months ago Annual physical exam   [facility] Glean Hess, MD   1 year ago Annual physical exam   [facility] Glean Hess, MD   1 year ago Essential hypertension   [facility] Glean Hess, MD   2 years ago Chronic obstructive pulmonary disease with acute exacerbation [facility])   [facility] Glean Hess, MD   2 years ago Essential hypertension   [place], Laura H, MD       Future Appointments             In 6 days Glean Hess, MD [facility], [place]   In 6 months Glean Hess, MD [facility]              Signed Prescriptions Disp Refills   rOPINIRole (REQUIP) 1 MG tablet 180 tablet 0    Sig: TAKE 1 TABLET BY MOUTH IN THE MORNING AND AT BEDTIME AND AS NEEDED THROUGHOUT THE DAY IF NEEDED      Neurology:  Parkinsonian Agents Failed - 08/16/2020 12:11 PM      Failed - Last BP in normal range    BP Readings from Last 1 Encounters:  03/24/20 (!) 144/78          Passed - Valid encounter within last 12 months    Recent Outpatient Visits           6 months ago Annual physical exam   [facility] Glean Hess, MD   1 year ago Annual physical exam   [facility] Glean Hess, MD   1 year ago Essential hypertension   [facility] Glean Hess, MD   2 years ago Chronic obstructive pulmonary disease with acute exacerbation [facility])   [facility] Glean Hess, MD   2 years ago Essential hypertension   [place], Laura H, MD       Future Appointments             In 6 days Glean Hess, MD [facility], [place]   In 6 months [place] Melia Jesse Sans, MD [facility], [facility]                 Requested Prescriptions  Pending Prescriptions Disp Refills   promethazine (PHENERGAN) 25 MG tablet [Pharmacy Med Name: PROMETHAZINE 25 MG TABLET 25 Tablet] 30 tablet 0    Sig: TAKE 1 TABLET BY MOUTH EVERY 6 HOURS AS NEEDED FOR NAUSEA OR VOMITING.      Not Delegated - Gastroenterology: Antiemetics Failed - 08/16/2020 12:11 PM  Failed - This refill cannot be delegated      Failed - Valid encounter within last 6 months    Recent Outpatient Visits           6 months ago Annual physical exam   [facility] Glean Hess, MD   1 year ago Annual physical exam   [facility] Glean Hess, MD   1 year ago Essential hypertension   [facility] Glean Hess, MD   2 years ago Chronic obstructive pulmonary disease with acute exacerbation [facility])   [facility] Glean Hess, MD   2 years ago Essential hypertension   [place], Laura H, MD       Future Appointments             In 6 days Glean Hess, MD [facility], [place]   In 6 months Glean Hess, MD [facility], [facility]              Signed Prescriptions Disp Refills   rOPINIRole (REQUIP) 1 MG tablet 180 tablet 0    Sig: TAKE 1 TABLET BY MOUTH IN THE MORNING AND AT BEDTIME AND AS NEEDED THROUGHOUT THE DAY IF NEEDED      Neurology:  Parkinsonian Agents Failed - 08/16/2020 12:11 PM      Failed - Last BP in normal range    BP  Readings from Last 1 Encounters:  03/24/20 (!) 144/78          Passed - Valid encounter within last 12 months    Recent Outpatient Visits           6 months ago Annual physical exam   [facility] Glean Hess, MD   1 year ago Annual physical exam   [facility] Glean Hess, MD   1 year ago Essential hypertension   [facility] Glean Hess, MD   2 years ago Chronic obstructive pulmonary disease with acute exacerbation [facility])   [facility] Glean Hess, MD   2 years ago Essential hypertension   [facility] Glean Hess, MD       Future Appointments             In 6 days Glean Hess, MD [facility], [place]   In 6 months Army Melia, Jesse Sans, MD [facility], [facility]

## 2020-08-19 ENCOUNTER — Other Ambulatory Visit: Payer: Self-pay

## 2020-08-19 MED FILL — Tiotropium Bromide Monohydrate Inhal Aerosol 1.25 MCG/ACT: RESPIRATORY_TRACT | 30 days supply | Qty: 4 | Fill #0 | Status: AC

## 2020-08-19 MED FILL — Promethazine HCl Tab 25 MG: ORAL | 30 days supply | Qty: 30 | Fill #0 | Status: AC

## 2020-08-19 MED FILL — Budesonide-Formoterol Fumarate Dihyd Aerosol 160-4.5 MCG/ACT: RESPIRATORY_TRACT | 30 days supply | Qty: 10.2 | Fill #0 | Status: AC

## 2020-08-19 MED FILL — Ropinirole Hydrochloride Tab 1 MG: ORAL | 90 days supply | Qty: 180 | Fill #0 | Status: AC

## 2020-08-22 ENCOUNTER — Ambulatory Visit: Payer: 59 | Admitting: Internal Medicine

## 2020-08-29 ENCOUNTER — Emergency Department: Payer: 59

## 2020-08-29 ENCOUNTER — Encounter: Payer: Self-pay | Admitting: Emergency Medicine

## 2020-08-29 ENCOUNTER — Other Ambulatory Visit: Payer: Self-pay

## 2020-08-29 ENCOUNTER — Inpatient Hospital Stay
Admission: EM | Admit: 2020-08-29 | Discharge: 2020-09-02 | DRG: 202 | Disposition: A | Discharge status: Home / Self Care | Payer: 59 | Attending: Internal Medicine | Admitting: Internal Medicine

## 2020-08-29 DIAGNOSIS — G4733 Obstructive sleep apnea (adult) (pediatric): Secondary | ICD-10-CM | POA: Diagnosis not present

## 2020-08-29 DIAGNOSIS — Z888 Allergy status to other drugs, medicaments and biological substances status: Secondary | ICD-10-CM

## 2020-08-29 DIAGNOSIS — Z8673 Personal history of transient ischemic attack (TIA), and cerebral infarction without residual deficits: Secondary | ICD-10-CM

## 2020-08-29 DIAGNOSIS — Z6841 Body Mass Index (BMI) 40.0 and over, adult: Secondary | ICD-10-CM

## 2020-08-29 DIAGNOSIS — J9601 Acute respiratory failure with hypoxia: Secondary | ICD-10-CM | POA: Diagnosis present

## 2020-08-29 DIAGNOSIS — F419 Anxiety disorder, unspecified: Secondary | ICD-10-CM | POA: Diagnosis present

## 2020-08-29 DIAGNOSIS — E538 Deficiency of other specified B group vitamins: Secondary | ICD-10-CM | POA: Diagnosis present

## 2020-08-29 DIAGNOSIS — Z825 Family history of asthma and other chronic lower respiratory diseases: Secondary | ICD-10-CM

## 2020-08-29 DIAGNOSIS — K219 Gastro-esophageal reflux disease without esophagitis: Secondary | ICD-10-CM | POA: Diagnosis not present

## 2020-08-29 DIAGNOSIS — J45901 Unspecified asthma with (acute) exacerbation: Secondary | ICD-10-CM | POA: Diagnosis present

## 2020-08-29 DIAGNOSIS — K580 Irritable bowel syndrome with diarrhea: Secondary | ICD-10-CM | POA: Diagnosis not present

## 2020-08-29 DIAGNOSIS — Z9851 Tubal ligation status: Secondary | ICD-10-CM | POA: Diagnosis not present

## 2020-08-29 DIAGNOSIS — I129 Hypertensive chronic kidney disease with stage 1 through stage 4 chronic kidney disease, or unspecified chronic kidney disease: Secondary | ICD-10-CM | POA: Diagnosis not present

## 2020-08-29 DIAGNOSIS — Z880 Allergy status to penicillin: Secondary | ICD-10-CM

## 2020-08-29 DIAGNOSIS — D519 Vitamin B12 deficiency anemia, unspecified: Secondary | ICD-10-CM | POA: Diagnosis present

## 2020-08-29 DIAGNOSIS — Z9989 Dependence on other enabling machines and devices: Secondary | ICD-10-CM

## 2020-08-29 DIAGNOSIS — R0609 Other forms of dyspnea: Secondary | ICD-10-CM | POA: Diagnosis not present

## 2020-08-29 DIAGNOSIS — J449 Chronic obstructive pulmonary disease, unspecified: Secondary | ICD-10-CM | POA: Diagnosis not present

## 2020-08-29 DIAGNOSIS — Z8701 Personal history of pneumonia (recurrent): Secondary | ICD-10-CM

## 2020-08-29 DIAGNOSIS — N1831 Chronic kidney disease, stage 3a: Secondary | ICD-10-CM | POA: Diagnosis not present

## 2020-08-29 DIAGNOSIS — Z85038 Personal history of other malignant neoplasm of large intestine: Secondary | ICD-10-CM

## 2020-08-29 DIAGNOSIS — Z20822 Contact with and (suspected) exposure to covid-19: Secondary | ICD-10-CM | POA: Diagnosis present

## 2020-08-29 DIAGNOSIS — J4541 Moderate persistent asthma with (acute) exacerbation: Principal | ICD-10-CM | POA: Diagnosis present

## 2020-08-29 DIAGNOSIS — I10 Essential (primary) hypertension: Secondary | ICD-10-CM | POA: Diagnosis present

## 2020-08-29 DIAGNOSIS — M1990 Unspecified osteoarthritis, unspecified site: Secondary | ICD-10-CM | POA: Diagnosis present

## 2020-08-29 DIAGNOSIS — R0602 Shortness of breath: Secondary | ICD-10-CM | POA: Diagnosis not present

## 2020-08-29 DIAGNOSIS — F32A Depression, unspecified: Secondary | ICD-10-CM | POA: Diagnosis present

## 2020-08-29 DIAGNOSIS — G2581 Restless legs syndrome: Secondary | ICD-10-CM | POA: Diagnosis not present

## 2020-08-29 DIAGNOSIS — R079 Chest pain, unspecified: Secondary | ICD-10-CM | POA: Diagnosis not present

## 2020-08-29 DIAGNOSIS — K589 Irritable bowel syndrome without diarrhea: Secondary | ICD-10-CM | POA: Diagnosis present

## 2020-08-29 DIAGNOSIS — R000 Tachycardia, unspecified: Secondary | ICD-10-CM | POA: Diagnosis not present

## 2020-08-29 DIAGNOSIS — Z8249 Family history of ischemic heart disease and other diseases of the circulatory system: Secondary | ICD-10-CM

## 2020-08-29 LAB — BASIC METABOLIC PANEL
Anion gap: 9 (ref 5–15)
BUN: 16 mg/dL (ref 8–23)
CO2: 28 mmol/L (ref 22–32)
Calcium: 9.3 mg/dL (ref 8.9–10.3)
Chloride: 102 mmol/L (ref 98–111)
Creatinine, Ser: 1.16 mg/dL — ABNORMAL HIGH (ref 0.44–1.00)
GFR, Estimated: 51 mL/min — ABNORMAL LOW (ref >60)
Glucose, Bld: 116 mg/dL — ABNORMAL HIGH (ref 70–99)
Potassium: 3.8 mmol/L (ref 3.5–5.1)
Sodium: 139 mmol/L (ref 135–145)

## 2020-08-29 LAB — CBC
HCT: 32.8 % — ABNORMAL LOW (ref 36.0–46.0)
Hemoglobin: 10.2 g/dL — ABNORMAL LOW (ref 12.0–15.0)
MCH: 24.4 pg — ABNORMAL LOW (ref 26.0–34.0)
MCHC: 31.1 g/dL (ref 30.0–36.0)
MCV: 78.5 fL — ABNORMAL LOW (ref 80.0–100.0)
Platelets: 273 10*3/uL (ref 150–400)
RBC: 4.18 MIL/uL (ref 3.87–5.11)
RDW: 15.9 % — ABNORMAL HIGH (ref 11.5–15.5)
WBC: 8 10*3/uL (ref 4.0–10.5)
nRBC: 0 % (ref 0.0–0.2)

## 2020-08-29 LAB — PROCALCITONIN: Procalcitonin: <0.1 ng/mL

## 2020-08-29 LAB — BRAIN NATRIURETIC PEPTIDE: B Natriuretic Peptide: 44.6 pg/mL (ref 0.0–100.0)

## 2020-08-29 LAB — POC SARS CORONAVIRUS 2 AG -  ED: SARS Coronavirus 2 Ag: NEGATIVE

## 2020-08-29 LAB — TROPONIN I (HIGH SENSITIVITY)
Troponin I (High Sensitivity): 9 ng/L (ref <18)
Troponin I (High Sensitivity): 9 ng/L (ref <18)

## 2020-08-29 LAB — D-DIMER, QUANTITATIVE: D-Dimer, Quant: 0.87 ug/mL-FEU — ABNORMAL HIGH (ref 0.00–0.50)

## 2020-08-29 MED ORDER — METHYLPREDNISOLONE SODIUM SUCC 125 MG IJ SOLR
125.0000 mg | INTRAMUSCULAR | Status: AC
  Administered 2020-08-29: 125 mg via INTRAVENOUS
  Filled 2020-08-29: qty 2

## 2020-08-29 MED ORDER — LORATADINE 10 MG PO TABS
10.0000 mg | ORAL_TABLET | Freq: Every day | ORAL | Status: DC
  Administered 2020-08-30 – 2020-09-02 (×4): 10 mg via ORAL
  Filled 2020-08-29 (×4): qty 1

## 2020-08-29 MED ORDER — IOHEXOL 350 MG/ML SOLN
75.0000 mL | Freq: Once | INTRAVENOUS | Status: AC | PRN
  Administered 2020-08-29: 75 mL via INTRAVENOUS
  Filled 2020-08-29: qty 75

## 2020-08-29 MED ORDER — POLYETHYLENE GLYCOL 3350 17 G PO PACK: 17.0000 g | PACK | Freq: Every day | ORAL | Status: DC | PRN

## 2020-08-29 MED ORDER — LEVALBUTEROL HCL 1.25 MG/0.5ML IN NEBU
1.2500 mg | INHALATION_SOLUTION | Freq: Once | RESPIRATORY_TRACT | Status: AC
  Administered 2020-08-29: 1.25 mg via RESPIRATORY_TRACT
  Filled 2020-08-29: qty 0.5

## 2020-08-29 MED ORDER — MOMETASONE FURO-FORMOTEROL FUM 200-5 MCG/ACT IN AERO
2.0000 | INHALATION_SPRAY | Freq: Two times a day (BID) | RESPIRATORY_TRACT | Status: DC
  Filled 2020-08-29: qty 8.8

## 2020-08-29 MED ORDER — PANTOPRAZOLE SODIUM 40 MG PO TBEC
40.0000 mg | DELAYED_RELEASE_TABLET | Freq: Every day | ORAL | Status: DC
  Administered 2020-08-30 – 2020-09-02 (×4): 40 mg via ORAL
  Filled 2020-08-29 (×5): qty 1

## 2020-08-29 MED ORDER — IPRATROPIUM-ALBUTEROL 0.5-2.5 (3) MG/3ML IN SOLN
3.0000 mL | Freq: Once | RESPIRATORY_TRACT | Status: AC
  Administered 2020-08-29: 3 mL via RESPIRATORY_TRACT

## 2020-08-29 MED ORDER — IPRATROPIUM-ALBUTEROL 0.5-2.5 (3) MG/3ML IN SOLN
3.0000 mL | Freq: Four times a day (QID) | RESPIRATORY_TRACT | Status: DC
  Administered 2020-08-29 – 2020-08-31 (×7): 3 mL via RESPIRATORY_TRACT
  Filled 2020-08-29 (×7): qty 3

## 2020-08-29 MED ORDER — CHOLESTYRAMINE LIGHT 4 G PO PACK
4.0000 g | PACK | Freq: Every day | ORAL | Status: DC
  Administered 2020-08-30 – 2020-09-01 (×4): 4 g via ORAL
  Filled 2020-08-29 (×5): qty 1

## 2020-08-29 MED ORDER — ENOXAPARIN SODIUM 80 MG/0.8ML ~~LOC~~ SOLN
0.5000 mg/kg | SUBCUTANEOUS | Status: DC
  Administered 2020-08-30 – 2020-09-01 (×4): 72.5 mg via SUBCUTANEOUS
  Filled 2020-08-29 (×6): qty 0.8

## 2020-08-29 MED ORDER — MAGNESIUM SULFATE IN D5W 1-5 GM/100ML-% IV SOLN
1.0000 g | Freq: Once | INTRAVENOUS | Status: AC
  Administered 2020-08-29: 1 g via INTRAVENOUS
  Filled 2020-08-29: qty 100

## 2020-08-29 MED ORDER — SODIUM CHLORIDE 0.9 % IV SOLN
500.0000 mg | Freq: Once | INTRAVENOUS | Status: AC
  Administered 2020-08-29: 500 mg via INTRAVENOUS
  Filled 2020-08-29: qty 500

## 2020-08-29 MED ORDER — ROPINIROLE HCL 1 MG PO TABS: 1.0000 mg | ORAL_TABLET | Freq: Two times a day (BID) | ORAL | Status: DC | PRN

## 2020-08-29 MED ORDER — LISINOPRIL 10 MG PO TABS
10.0000 mg | ORAL_TABLET | Freq: Every day | ORAL | Status: DC
  Administered 2020-08-30 – 2020-09-02 (×4): 10 mg via ORAL
  Filled 2020-08-29 (×4): qty 1

## 2020-08-29 MED ORDER — SODIUM CHLORIDE 0.9% FLUSH
3.0000 mL | Freq: Two times a day (BID) | INTRAVENOUS | Status: DC
  Administered 2020-08-30 – 2020-09-01 (×7): 3 mL via INTRAVENOUS

## 2020-08-29 MED ORDER — ACETAMINOPHEN 325 MG PO TABS
650.0000 mg | ORAL_TABLET | Freq: Four times a day (QID) | ORAL | Status: DC | PRN
  Administered 2020-08-30: 650 mg via ORAL
  Filled 2020-08-29 (×2): qty 2

## 2020-08-29 MED ORDER — ALBUTEROL SULFATE (2.5 MG/3ML) 0.083% IN NEBU
2.5000 mg | INHALATION_SOLUTION | RESPIRATORY_TRACT | Status: DC | PRN
  Administered 2020-08-30 (×2): 2.5 mg via RESPIRATORY_TRACT
  Filled 2020-08-29 (×2): qty 3

## 2020-08-29 MED ORDER — ACETAMINOPHEN 650 MG RE SUPP: 650.0000 mg | Freq: Four times a day (QID) | RECTAL | Status: DC | PRN

## 2020-08-29 MED ORDER — PREDNISONE 20 MG PO TABS
40.0000 mg | ORAL_TABLET | Freq: Every day | ORAL | Status: DC
  Administered 2020-08-30 – 2020-09-02 (×4): 40 mg via ORAL
  Filled 2020-08-29 (×4): qty 2

## 2020-08-29 MED ORDER — ENOXAPARIN SODIUM 40 MG/0.4ML ~~LOC~~ SOLN: 40.0000 mg | SUBCUTANEOUS | Status: DC

## 2020-08-29 MED ORDER — IPRATROPIUM-ALBUTEROL 0.5-2.5 (3) MG/3ML IN SOLN
3.0000 mL | Freq: Once | RESPIRATORY_TRACT | Status: AC
  Administered 2020-08-29: 3 mL via RESPIRATORY_TRACT
  Filled 2020-08-29: qty 6

## 2020-08-29 MED ORDER — CHOLESTYRAMINE 4 GM/DOSE PO POWD: 4.0000 g | Freq: Every day | ORAL | Status: DC

## 2020-08-29 NOTE — ED Notes (Signed)
Pt to CT at this time.

## 2020-08-29 NOTE — ED Provider Notes (Signed)
[facility] Emergency Department Provider Note  ____________________________________________   Event Date/Time   First MD Initiated Contact with Patient 08/29/20 1235     (approximate)  I have reviewed the triage vital signs and the nursing notes.   HISTORY  Chief Complaint Short of breath   HPI Samantha Mason is a 70 y.o. female   here for evaluation of shortness of breath.  Patient began experiencing shortness of breath on Sunday evening.  Has history of asthma and COPD.  She tried multiple nebulizer treatments at home but is not improving.  Feels short of breath, also having some pain in her mid back.  No fevers.  She has been having a productive cough and when she coughs she is coughed up a phlegm that she reports tastes funny almost like a pus  No nausea vomiting no fevers or chills.  No abdominal pain.  Works as a Education administrator here  Past Medical History:  Diagnosis Date  . Acute bronchitis   . Anemia   . Anxiety and depression   . Arthritis   . Asthma   . B12 deficiency 03/14/2017  . Chest pain 07/02/2018  . COPD (chronic obstructive pulmonary disease) ([place]) 07/24/2018  . Family history of blood clots 07/16/2017  . Irritable bowel syndrome   . Malignant hypertension   . Obesity   . OSA (obstructive sleep apnea)    uses cpap  . Pneumonia 2017  . Restless leg syndrome   . Stroke [facility]) 1995   mild    Patient Active Problem List   Diagnosis Date Noted  . Asthma in adult, moderate persistent, with acute exacerbation 08/29/2020  . Ovarian failure 02/21/2020  . Total knee replacement status 04/30/2019  . Primary osteoarthritis of right knee 02/13/2019  . S/P total knee arthroplasty, left 02/13/2019  . Aortic atherosclerosis ([place]) 08/01/2018  . B12 deficiency anemia 07/07/2018  . Benign neoplasm of ascending colon   . Positive colorectal cancer screening using Cologuard test 03/13/2018  . Severe persistent asthma 02/13/2018  .  Vitamin D deficiency 03/17/2017  . Prediabetes 03/14/2017  . Microcytic anemia 03/14/2017  . IBS (irritable bowel syndrome) 03/12/2017  . Allergic rhinitis 03/12/2017  . Restless leg syndrome 03/12/2017  . Acid reflux 10/16/2016  . Essential hypertension 10/16/2016  . OSA on CPAP 12/08/2014  . BMI 50.0-59.9, adult ([place]) 05/31/2014    Past Surgical History:  Procedure Laterality Date  . CHOLECYSTECTOMY    . COLONOSCOPY WITH PROPOFOL N/A 03/31/2018   Procedure: COLONOSCOPY WITH PROPOFOL;  Surgeon: Lucilla Lame, MD;  Location: [facility] ENDOSCOPY;  Service: Endoscopy;  Laterality: N/A;  . KNEE ARTHROPLASTY Right 04/30/2019   Procedure: COMPUTER ASSISTED TOTAL KNEE ARTHROPLASTY;  Surgeon: Dereck Leep, MD;  Location: [facility] ORS;  Service: Orthopedics;  Laterality: Right;  . saliva gland removal  1998  . TOTAL KNEE ARTHROPLASTY Left 2014  . TUBAL LIGATION Bilateral     Prior to Admission medications   Medication Sig Start Date End Date Taking? Authorizing Provider  acetaminophen (TYLENOL) 500 MG tablet Take 2 tablets (1,000 mg total) by mouth every 8 (eight) hours as needed for moderate pain. 04/09/17   Plonk, [name], MD  albuterol (ACCUNEB) 1.25 MG/3ML nebulizer solution Take 1 ampule by nebulization every 6 (six) hours as needed for wheezing.    [provider]  budesonide-formoterol (SYMBICORT) 160-4.5 MCG/ACT inhaler INHALE 2 PUFFS BY MOUTH 2 TIMES DAILY. 04/28/20 04/28/21  Glean Hess, MD  cetirizine (ZYRTEC) 10 MG tablet  Take 1 tablet (10 mg total) by mouth daily. Patient taking differently: Take 10 mg by mouth every morning.  12/07/14   Ashok Norris, MD  cholestyramine Lucrezia Starch) 4 GM/DOSE powder MIX 4 GM AND DRINK BY MOUTH DAILY AT BEDTIME. 07/12/20   Glean Hess, MD  diphenoxylate-atropine (LOMOTIL) 2.5-0.025 MG tablet Take 1 tablet by mouth 3 (three) times daily as needed for diarrhea or loose stools. Patient not taking: Reported on 03/24/2020 10/16/16   Roselee Nova, MD  lisinopril (ZESTRIL) 10 MG tablet TAKE 1 TABLET BY MOUTH DAILY. 07/12/20 07/12/21  Glean Hess, MD  pantoprazole (PROTONIX) 40 MG tablet TAKE 1 TABLET BY MOUTH DAILY 06/09/20 06/09/21  Glean Hess, MD  promethazine (PHENERGAN) 25 MG tablet TAKE 1 TABLET BY MOUTH EVERY 6 HOURS AS NEEDED FOR NAUSEA OR VOMITING. 08/16/20 08/16/21  Glean Hess, MD  rOPINIRole (REQUIP) 1 MG tablet TAKE 1 TABLET BY MOUTH IN THE MORNING AND AT BEDTIME AND AS NEEDED THROUGHOUT THE DAY IF NEEDED 08/16/20 08/16/21  Glean Hess, MD  Tiotropium Bromide Monohydrate 1.25 MCG/ACT AERS INHALE 2 PUFFS INTO THE LUNGS ONCE DAILY 03/24/20 03/24/21  Flora Lipps, MD    Allergies Nuvigil [armodafinil], Penicillins, Gabapentin, and Provigil [modafinil]  Family History  Problem Relation Age of Onset  . Dementia Mother   . COPD Father        33  . Diabetes Father   . Heart failure Brother        2010  . Breast cancer Neg Hx     Social History Social History   Tobacco Use  . Smoking status: Never Smoker  . Smokeless tobacco: Never Used  Vaping Use  . Vaping Use: Never used  Substance Use Topics  . Alcohol use: No    Alcohol/week: 0.0 standard drinks  . Drug use: No    Review of Systems Constitutional: No fever/chills Eyes: No visual changes. ENT: No sore throat. Cardiovascular: Denies chest pain. Respiratory: Short of breath, noticeably worse Gastrointestinal: No abdominal pain.   Genitourinary: Negative for dysuria. Musculoskeletal: Negative for back pain. Skin: Negative for rash. Neurological: Negative for headaches, areas of focal weakness or numbness.    ____________________________________________   PHYSICAL EXAM:  VITAL SIGNS: ED Triage Vitals  Enc Vitals Group     BP 08/29/20 1227 (!) 164/58     Pulse Rate 08/29/20 1232 (!) 111     Resp 08/29/20 1227 20     Temp 08/29/20 1227 98.6 F (37 C)     Temp Source 08/29/20 1227 Oral     SpO2 08/29/20 1227 96 %      Weight 08/29/20 1232 (!) 320 lb (145.2 kg)     Height 08/29/20 1232 5\' 4"  (1.626 m)     Head Circumference --      Peak Flow --      Pain Score 08/29/20 1232 8     Pain Loc --      Pain Edu? --      Excl. in [place]? --     Constitutional: Alert and oriented.  Appears moderately dyspneic.  Some wheezing notable. Eyes: Conjunctivae are normal. Head: Atraumatic. Nose: No congestion/rhinnorhea. Mouth/Throat: Mucous membranes are moist. Neck: No stridor.  Cardiovascular: Mildly tachycardic rate, regular rhythm. Grossly normal heart sounds.  Good peripheral circulation. Respiratory: Mild increased work of breathing mild accessory muscle use.  Moderate end expiratory wheezing.  No focal rales, and speaks in phrases Gastrointestinal: Soft and nontender. No distention.  Morbid obesity. Musculoskeletal: No lower extremity tenderness Neurologic:  Normal speech and language. No gross focal neurologic deficits are appreciated.  Skin:  Skin is warm, dry and intact. No rash noted. Psychiatric: Mood and affect are normal. Speech and behavior are normal.  ____________________________________________   LABS (all labs ordered are listed, but only abnormal results are displayed)  Labs Reviewed  BASIC METABOLIC PANEL - Abnormal; Notable for the following components:      Result Value   Glucose, Bld 116 (*)    Creatinine, Ser 1.16 (*)    GFR, Estimated 51 (*)    All other components within normal limits  CBC - Abnormal; Notable for the following components:   Hemoglobin 10.2 (*)    HCT 32.8 (*)    MCV 78.5 (*)    MCH 24.4 (*)    RDW 15.9 (*)    All other components within normal limits  D-DIMER, QUANTITATIVE - Abnormal; Notable for the following components:   D-Dimer, Quant 0.87 (*)    All other components within normal limits  SARS CORONAVIRUS 2 (TAT 6-24 HRS)  CULTURE, BLOOD (ROUTINE X 2)  CULTURE, BLOOD (ROUTINE X 2)  RESPIRATORY PANEL BY PCR  BRAIN NATRIURETIC PEPTIDE  PROCALCITONIN   HIV ANTIBODY (ROUTINE TESTING W REFLEX)  BASIC METABOLIC PANEL  CBC  POC SARS CORONAVIRUS 2 AG -  ED  TROPONIN I (HIGH SENSITIVITY)  TROPONIN I (HIGH SENSITIVITY)   ____________________________________________  EKG  Reviewed inter by me at 1315 Heart rate 110 QRS 80 QTc 430 Sinus tachycardia no evidence of acute ischemia ____________________________________________  RADIOLOGY  DG Chest 2 View  Result Date: 08/29/2020 CLINICAL DATA:  Shortness of breath EXAM: CHEST - 2 VIEW COMPARISON:  02/10/2020 FINDINGS: Cardiac shadow is within normal limits. The lungs are well aerated bilaterally. Mild bronchitic changes are again identified and similar to that seen on the prior exam likely of a more chronic nature. No focal infiltrate or effusion is seen. No bony abnormality is noted. IMPRESSION: Chronic bronchitic changes without acute abnormality. Electronically Signed   By: Inez Catalina M.D.   On: 08/29/2020 14:14   CT Angio Chest PE W and/or Wo Contrast  Result Date: 08/29/2020 CLINICAL DATA:  70 year old with shortness of breath and chest pain. EXAM: CT ANGIOGRAPHY CHEST WITH CONTRAST TECHNIQUE: Multidetector CT imaging of the chest was performed using the standard protocol during bolus administration of intravenous contrast. Multiplanar CT image reconstructions and MIPs were obtained to evaluate the vascular anatomy. CONTRAST:  59mL OMNIPAQUE IOHEXOL 350 MG/ML SOLN COMPARISON:  Chest CT 05/07/2019 FINDINGS: Cardiovascular: No evidence for filling defects in the main or lobar pulmonary arteries. Limited evaluation of the segmental pulmonary arteries and more distal branches. Heart size is within normal limits. No significant pericardial fluid. Atherosclerotic calcification involving thoracic aorta without aneurysm. Mediastinum/Nodes: No significant lymph node enlargement in the mediastinum or hilar regions. No axillary lymph node enlargement. Lungs/Pleura: Trachea and mainstem bronchi are  patent. No large pleural effusions. Patchy densities along the periphery of the right lower lobe on sequence 6 image 46 are new. Questionable tiny nodule in the periphery of the right upper lobe on sequence 6, image 18 is probably chronic. Ground-glass opacities in the right upper lobe are similar to the previous examination may be related to areas of air trapping. Subtle ground-glass opacities in left upper lobe are nonspecific. Upper Abdomen: Cholecystectomy. No acute abnormality in the visualized upper abdomen. Musculoskeletal: No acute bone abnormality. Review of the MIP images confirms the above  findings. IMPRESSION: 1. Subtle patchy densities in the right lower lobe. Findings could represent a small area of infection in the right lower lobe. Additional ground-glass areas in the upper lobes which could be related air trapping versus atelectasis. 2. No evidence for a large or central pulmonary embolism. Study has technical limitations but there is no evidence for pulmonary embolism in the main or lobar pulmonary arteries. 3.  Aortic Atherosclerosis (ICD10-I70.0). Electronically Signed   By: Markus Daft M.D.   On: 08/29/2020 15:47    CT chest reviewed, no pulmonary embolism noted.  Some technical limitations.  Subtle patchy density right lower lobe. ____________________________________________   PROCEDURES  Procedure(s) performed: None  Procedures  Critical Care performed: Yes, see critical care note(s)  CRITICAL CARE Performed by: Delman Kitten   Total critical care time: 25 minutes  Critical care time was exclusive of separately billable procedures and treating other patients.  Critical care was necessary to treat or prevent imminent or life-threatening deterioration.  Critical care was time spent personally by me on the following activities: development of treatment plan with patient and/or surrogate as well as nursing, discussions with consultants, evaluation of patient's response to  treatment, examination of patient, obtaining history from patient or surrogate, ordering and performing treatments and interventions, ordering and review of laboratory studies, ordering and review of radiographic studies, pulse oximetry and re-evaluation of patient's condition.  ____________________________________________   INITIAL IMPRESSION / ASSESSMENT AND PLAN / ED COURSE  Pertinent labs & imaging results that were available during my care of the patient were reviewed by me and considered in my medical decision making (see chart for details).   Patient presents for dyspnea, has history of COPD asthma, audible wheezing on examination and increased work of breathing.  Been using inhalers at home without relief.  We will treat aggressively for what appears to be likely asthma or bronchitis type symptomatology, however also maintain broad differential.  No evidence of acute ACS.  Additionally further work-up included CT angiogram of the chest which reveals a possible right lower lobe pneumonia, will start azithromycin, order blood cultures, procalcitonin.  May also additionally represent viral etiology, nonetheless patient responding to treatments, but after feeling improved and wheezing had resolved, patient trial of ambulation did not become hypoxic but did become acutely tachycardic and quite dyspneic once again.  I suspect the patient may have relatively low reserve capacity, and given the patient's significant dyspnea on exertion I believe the patient would benefit from further hospitalization.  Discussed with the hospitalist Dr. Trilby Drummer, patient accepted.  Patient and her son at the bedside understanding agreeable with plan for admission.  Working diagnosis at this time possible viral versus bacterial pneumonia with associated asthma or COPD exacerbation.  Further care and work-up under the hospitalist service      ____________________________________________   FINAL CLINICAL IMPRESSION(S) /  ED DIAGNOSES  Final diagnoses:  Moderate persistent asthma with exacerbation        Note:  This document was prepared using Dragon voice recognition software and may include unintentional dictation errors       Delman Kitten, MD 08/29/20 2030

## 2020-08-29 NOTE — ED Triage Notes (Signed)
Pt arrived from home with c/o SOB starting Sunday night. Pt states hx of asthma. Pt states she used 3 nebulizer treatments with no relief. Pt states she is not wheezing, just feels like she is having trouble breathing.  Pt states she also used her daily and her rescue inhaler today with no relief.   Pt states "raw" feeling in her chest with the SOB.

## 2020-08-29 NOTE — ED Provider Notes (Incomplete)
[facility] Emergency Department Provider Note  ____________________________________________   Event Date/Time   First MD Initiated Contact with Patient 08/29/20 1235     (approximate)  I have reviewed the triage vital signs and the nursing notes.   HISTORY  Chief Complaint Short of breath   HPI Samantha Mason is a 70 y.o. female   here for evaluation of shortness of breath.  Patient began experiencing shortness of breath on Sunday evening.  Has history of asthma and COPD.  She tried multiple nebulizer treatments at home but is not improving.  Feels short of breath, also having some pain in her mid back.  No fevers.  She has been having a productive cough and when she coughs she is coughed up a phlegm that she reports tastes funny almost like a pus  No nausea vomiting no fevers or chills.  No abdominal pain.  Works as a Education administrator here  Past Medical History:  Diagnosis Date  . Acute bronchitis   . Anemia   . Anxiety and depression   . Arthritis   . Asthma   . B12 deficiency 03/14/2017  . Chest pain 07/02/2018  . COPD (chronic obstructive pulmonary disease) ([place]) 07/24/2018  . Family history of blood clots 07/16/2017  . Irritable bowel syndrome   . Malignant hypertension   . Obesity   . OSA (obstructive sleep apnea)    uses cpap  . Pneumonia 2017  . Restless leg syndrome   . Stroke [facility]) 1995   mild    Patient Active Problem List   Diagnosis Date Noted  . Ovarian failure 02/21/2020  . Total knee replacement status 04/30/2019  . Primary osteoarthritis of right knee 02/13/2019  . S/P total knee arthroplasty, left 02/13/2019  . Aortic atherosclerosis ([place]) 08/01/2018  . B12 deficiency anemia 07/07/2018  . Benign neoplasm of ascending colon   . Positive colorectal cancer screening using Cologuard test 03/13/2018  . Severe persistent asthma 02/13/2018  . Vitamin D deficiency 03/17/2017  . Prediabetes 03/14/2017  . Microcytic  anemia 03/14/2017  . IBS (irritable bowel syndrome) 03/12/2017  . Allergic rhinitis 03/12/2017  . Restless leg syndrome 03/12/2017  . Acid reflux 10/16/2016  . Essential hypertension 10/16/2016  . OSA on CPAP 12/08/2014  . BMI 50.0-59.9, adult ([place]) 05/31/2014    Past Surgical History:  Procedure Laterality Date  . CHOLECYSTECTOMY    . COLONOSCOPY WITH PROPOFOL N/A 03/31/2018   Procedure: COLONOSCOPY WITH PROPOFOL;  Surgeon: Lucilla Lame, MD;  Location: [facility] ENDOSCOPY;  Service: Endoscopy;  Laterality: N/A;  . KNEE ARTHROPLASTY Right 04/30/2019   Procedure: COMPUTER ASSISTED TOTAL KNEE ARTHROPLASTY;  Surgeon: Dereck Leep, MD;  Location: [facility] ORS;  Service: Orthopedics;  Laterality: Right;  . saliva gland removal  1998  . TOTAL KNEE ARTHROPLASTY Left 2014  . TUBAL LIGATION Bilateral     Prior to Admission medications   Medication Sig Start Date End Date Taking? Authorizing Provider  acetaminophen (TYLENOL) 500 MG tablet Take 2 tablets (1,000 mg total) by mouth every 8 (eight) hours as needed for moderate pain. 04/09/17   Plonk, [name], MD  albuterol (ACCUNEB) 1.25 MG/3ML nebulizer solution Take 1 ampule by nebulization every 6 (six) hours as needed for wheezing.    [provider]  budesonide-formoterol (SYMBICORT) 160-4.5 MCG/ACT inhaler INHALE 2 PUFFS BY MOUTH 2 TIMES DAILY. 04/28/20 04/28/21  Glean Hess, MD  cetirizine (ZYRTEC) 10 MG tablet Take 1 tablet (10 mg total) by mouth daily. Patient taking  differently: Take 10 mg by mouth every morning.  12/07/14   Ashok Norris, MD  cholestyramine Lucrezia Starch) 4 GM/DOSE powder MIX 4 GM AND DRINK BY MOUTH DAILY AT BEDTIME. 07/12/20   Glean Hess, MD  diphenoxylate-atropine (LOMOTIL) 2.5-0.025 MG tablet Take 1 tablet by mouth 3 (three) times daily as needed for diarrhea or loose stools. Patient not taking: Reported on 03/24/2020 10/16/16   Roselee Nova, MD  lisinopril (ZESTRIL) 10 MG tablet TAKE 1 TABLET BY MOUTH  DAILY. 07/12/20 07/12/21  Glean Hess, MD  pantoprazole (PROTONIX) 40 MG tablet TAKE 1 TABLET BY MOUTH DAILY 06/09/20 06/09/21  Glean Hess, MD  promethazine (PHENERGAN) 25 MG tablet TAKE 1 TABLET BY MOUTH EVERY 6 HOURS AS NEEDED FOR NAUSEA OR VOMITING. 08/16/20 08/16/21  Glean Hess, MD  rOPINIRole (REQUIP) 1 MG tablet TAKE 1 TABLET BY MOUTH IN THE MORNING AND AT BEDTIME AND AS NEEDED THROUGHOUT THE DAY IF NEEDED 08/16/20 08/16/21  Glean Hess, MD  Tiotropium Bromide Monohydrate 1.25 MCG/ACT AERS INHALE 2 PUFFS INTO THE LUNGS ONCE DAILY 03/24/20 03/24/21  Flora Lipps, MD    Allergies Nuvigil [armodafinil], Penicillins, Gabapentin, and Provigil [modafinil]  Family History  Problem Relation Age of Onset  . Dementia Mother   . COPD Father        38  . Diabetes Father   . Heart failure Brother        2010  . Breast cancer Neg Hx     Social History Social History   Tobacco Use  . Smoking status: Never Smoker  . Smokeless tobacco: Never Used  Vaping Use  . Vaping Use: Never used  Substance Use Topics  . Alcohol use: No    Alcohol/week: 0.0 standard drinks  . Drug use: No    Review of Systems Constitutional: No fever/chills Eyes: No visual changes. ENT: No sore throat. Cardiovascular: Denies chest pain. Respiratory: Short of breath, noticeably worse Gastrointestinal: No abdominal pain.   Genitourinary: Negative for dysuria. Musculoskeletal: Negative for back pain. Skin: Negative for rash. Neurological: Negative for headaches, areas of focal weakness or numbness.    ____________________________________________   PHYSICAL EXAM:  VITAL SIGNS: ED Triage Vitals  Enc Vitals Group     BP 08/29/20 1227 (!) 164/58     Pulse Rate 08/29/20 1232 (!) 111     Resp 08/29/20 1227 20     Temp 08/29/20 1227 98.6 F (37 C)     Temp Source 08/29/20 1227 Oral     SpO2 08/29/20 1227 96 %     Weight 08/29/20 1232 (!) 320 lb (145.2 kg)     Height 08/29/20 1232 5'  4" (1.626 m)     Head Circumference --      Peak Flow --      Pain Score 08/29/20 1232 8     Pain Loc --      Pain Edu? --      Excl. in [place]? --     Constitutional: Alert and oriented. Well appearing and in no acute distress. Eyes: Conjunctivae are normal. Head: Atraumatic. Nose: No congestion/rhinnorhea. Mouth/Throat: Mucous membranes are moist. Neck: No stridor.  Cardiovascular: Normal rate, regular rhythm. Grossly normal heart sounds.  Good peripheral circulation. Respiratory: Normal respiratory effort.  No retractions. Lungs CTAB. Gastrointestinal: Soft and nontender. No distention. {**Genitourinary:  **}Musculoskeletal: No lower extremity tenderness nor edema. Neurologic:  Normal speech and language. No gross focal neurologic deficits are appreciated.  Skin:  Skin is warm,  dry and intact. No rash noted. Psychiatric: Mood and affect are normal. Speech and behavior are normal.  ____________________________________________   LABS (all labs ordered are listed, but only abnormal results are displayed)  Labs Reviewed  BASIC METABOLIC PANEL - Abnormal; Notable for the following components:      Result Value   Glucose, Bld 116 (*)    Creatinine, Ser 1.16 (*)    GFR, Estimated 51 (*)    All other components within normal limits  CBC - Abnormal; Notable for the following components:   Hemoglobin 10.2 (*)    HCT 32.8 (*)    MCV 78.5 (*)    MCH 24.4 (*)    RDW 15.9 (*)    All other components within normal limits  D-DIMER, QUANTITATIVE - Abnormal; Notable for the following components:   D-Dimer, Quant 0.87 (*)    All other components within normal limits  SARS CORONAVIRUS 2 (TAT 6-24 HRS)  CULTURE, BLOOD (ROUTINE X 2)  CULTURE, BLOOD (ROUTINE X 2)  BRAIN NATRIURETIC PEPTIDE  PROCALCITONIN  POC SARS CORONAVIRUS 2 AG -  ED  TROPONIN I (HIGH SENSITIVITY)  TROPONIN I (HIGH SENSITIVITY)    ____________________________________________  EKG  *** ____________________________________________  RADIOLOGY  *** ____________________________________________   PROCEDURES  Procedure(s) performed: {Name/None:19197::"***, see procedure note(s).","None"}  Procedures  Critical Care performed: {CriticalCareYesNo:19197::"Yes, see critical care note(s)","No"}  ____________________________________________   INITIAL IMPRESSION / ASSESSMENT AND PLAN / ED COURSE  Pertinent labs & imaging results that were available during my care of the patient were reviewed by me and considered in my medical decision making (see chart for details).   ***      ____________________________________________   FINAL CLINICAL IMPRESSION(S) / ED DIAGNOSES  Final diagnoses:  None        Note:  This document was prepared using Dragon voice recognition software and may include unintentional dictation errors  {** REMINDER - THIS NOTE IS NOT A FINAL MEDICAL RECORD UNTIL IT IS SIGNED.  UNTIL THEN, THE CONTENT BELOW MAY REFLECT INFORMATION FROM A DOCUMENTATION TEMPLATE, NOT THE ACTUAL PATIENT VISIT. **}

## 2020-08-29 NOTE — ED Notes (Addendum)
Pt ambulated: Oxygen at 95%, HR 133; pt physically having a harder time catching her breathe states, "Can we take a short cut? Im out of breathe". Pt returned to room. Jacqualine Code MD and Bubba Hales RN notified.

## 2020-08-29 NOTE — H&P (Signed)
History and Physical   Samantha Mason:[id number] DOB: [birth year] DOA: 08/29/2020  PCP: Glean Hess, MD   Patient coming from: Home  Chief Complaint: Shortness of breath  HPI: Samantha Mason is a 70 y.o. female with medical history significant of anxiety, depression, GERD, B12 deficiency, obesity, hypertension, IBS, OSA, RLS, colon cancer, asthma, chronic bronchitis, CVA who presents with shortness of breath.  Patient reports she has had about 3 days of shortness of breath.  She has been using her home duo nebs consistently without improvement and in fact having some steady worsening despite the duo nebs.  Presented to the ED today due to failure to improve on home therapies.  She states she feels particularly symptomatic with any exertion.  And this was shown in the ED whenever she attempted to walk she became profoundly tachycardic.  She denies fevers, chills, abdominal pain, constipation, diarrhea, nausea, vomiting.  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  ED Course: Vital signs in the ED significant for heart rate in the 100s, blood pressure in the 321Y to 248 systolic, respirations in the 20s, saturating well on room air.  Lab work-up showed CMP with creatinine of 1.16 which is stable, glucose 110, hemoglobin 10.2 which is stable.  Troponin negative x2, D-dimer elevated to 0.87, BNP normal at 44, respiratory panel for flu and Covid pending, chest x-ray with chronic bronchitic changes, CT PE without large PE though technically limited, patchy right lower lobe changes question pneumonia.  Patient received 2 duo nebs, 2 albuterol nebs, magnesium, Solu-Medrol in the ED and also given a dose of azithromycin.  Review of Systems: As per HPI otherwise all other systems reviewed and are negative.  Past Medical History:  Diagnosis Date  . Acute bronchitis   . Anemia   . Anxiety and depression   . Arthritis   . Asthma   . B12 deficiency 03/14/2017  .  Chest pain 07/02/2018  . COPD (chronic obstructive pulmonary disease) ([place]) 07/24/2018  . Family history of blood clots 07/16/2017  . Irritable bowel syndrome   . Malignant hypertension   . Obesity   . OSA (obstructive sleep apnea)    uses cpap  . Pneumonia 2017  . Restless leg syndrome   . Stroke [facility]) 1995   mild    Past Surgical History:  Procedure Laterality Date  . CHOLECYSTECTOMY    . COLONOSCOPY WITH PROPOFOL N/A 03/31/2018   Procedure: COLONOSCOPY WITH PROPOFOL;  Surgeon: Lucilla Lame, MD;  Location: [facility] ENDOSCOPY;  Service: Endoscopy;  Laterality: N/A;  . KNEE ARTHROPLASTY Right 04/30/2019   Procedure: COMPUTER ASSISTED TOTAL KNEE ARTHROPLASTY;  Surgeon: Dereck Leep, MD;  Location: [facility] ORS;  Service: Orthopedics;  Laterality: Right;  . saliva gland removal  1998  . TOTAL KNEE ARTHROPLASTY Left 2014  . TUBAL LIGATION Bilateral     Social History  reports that she has never smoked. She has never used smokeless tobacco. She reports that she does not drink alcohol and does not use drugs.  Allergies  Allergen Reactions  . Nuvigil [Armodafinil] Hives  . Penicillins Diarrhea and Nausea And Vomiting    Did it involve swelling of the face/tongue/throat, SOB, or low BP? no Did it involve sudden or severe rash/hives, skin peeling, or any reaction on the inside of your mouth or nose? No Did you need to seek medical attention at a hospital or doctor's office? No When did it last happen?in her 73s If all above answers are "  NO", may proceed with cephalosporin use.   . Gabapentin Other (See Comments)    "wiped her out, couldn't stay awake"  . Provigil [Modafinil] Hives    Family History  Problem Relation Age of Onset  . Dementia Mother   . COPD Father        56  . Diabetes Father   . Heart failure Brother        2010  . Breast cancer Neg Hx   Reviewed on admission  Prior to Admission medications   Medication Sig Start Date End Date Taking? Authorizing  Provider  acetaminophen (TYLENOL) 500 MG tablet Take 2 tablets (1,000 mg total) by mouth every 8 (eight) hours as needed for moderate pain. 04/09/17   Plonk, [name], MD  albuterol (ACCUNEB) 1.25 MG/3ML nebulizer solution Take 1 ampule by nebulization every 6 (six) hours as needed for wheezing.    [provider]  budesonide-formoterol (SYMBICORT) 160-4.5 MCG/ACT inhaler INHALE 2 PUFFS BY MOUTH 2 TIMES DAILY. 04/28/20 04/28/21  Glean Hess, MD  cetirizine (ZYRTEC) 10 MG tablet Take 1 tablet (10 mg total) by mouth daily. Patient taking differently: Take 10 mg by mouth every morning.  12/07/14   Ashok Norris, MD  cholestyramine Lucrezia Starch) 4 GM/DOSE powder MIX 4 GM AND DRINK BY MOUTH DAILY AT BEDTIME. 07/12/20   Glean Hess, MD  diphenoxylate-atropine (LOMOTIL) 2.5-0.025 MG tablet Take 1 tablet by mouth 3 (three) times daily as needed for diarrhea or loose stools. Patient not taking: Reported on 03/24/2020 10/16/16   Roselee Nova, MD  lisinopril (ZESTRIL) 10 MG tablet TAKE 1 TABLET BY MOUTH DAILY. 07/12/20 07/12/21  Glean Hess, MD  pantoprazole (PROTONIX) 40 MG tablet TAKE 1 TABLET BY MOUTH DAILY 06/09/20 06/09/21  Glean Hess, MD  promethazine (PHENERGAN) 25 MG tablet TAKE 1 TABLET BY MOUTH EVERY 6 HOURS AS NEEDED FOR NAUSEA OR VOMITING. 08/16/20 08/16/21  Glean Hess, MD  rOPINIRole (REQUIP) 1 MG tablet TAKE 1 TABLET BY MOUTH IN THE MORNING AND AT BEDTIME AND AS NEEDED THROUGHOUT THE DAY IF NEEDED 08/16/20 08/16/21  Glean Hess, MD  Tiotropium Bromide Monohydrate 1.25 MCG/ACT AERS INHALE 2 PUFFS INTO THE LUNGS ONCE DAILY 03/24/20 03/24/21  Flora Lipps, MD    Physical Exam: Vitals:   08/29/20 1500 08/29/20 1630 08/29/20 1647 08/29/20 1806  BP: (!) 172/73 (!) 172/73  (!) 172/73  Pulse: (!) 109 (!) 105 (!) 115 (!) 110  Resp: 20 20  19   Temp:      TempSrc:      SpO2: 92% 97% 91% 94%  Weight:      Height:       Physical Exam Constitutional:       General: She is not in acute distress.    Appearance: Normal appearance. She is obese.  HENT:     Head: Normocephalic and atraumatic.     Mouth/Throat:     Mouth: Mucous membranes are moist.     Pharynx: Oropharynx is clear.  Eyes:     Extraocular Movements: Extraocular movements intact.     Pupils: Pupils are equal, round, and reactive to light.  Cardiovascular:     Rate and Rhythm: Regular rhythm. Tachycardia present.     Pulses: Normal pulses.     Heart sounds: Normal heart sounds.  Pulmonary:     Effort: Pulmonary effort is normal. No respiratory distress.     Breath sounds: Wheezing present.  Abdominal:     General: Bowel sounds  are normal. There is no distension.     Palpations: Abdomen is soft.     Tenderness: There is no abdominal tenderness.  Musculoskeletal:        General: No swelling or deformity.  Skin:    General: Skin is warm and dry.  Neurological:     General: No focal deficit present.     Mental Status: Mental status is at baseline.    Labs on Admission: I have personally reviewed following labs and imaging studies  CBC: Recent Labs  Lab 08/29/20 1314  WBC 8.0  HGB 10.2*  HCT 32.8*  MCV 78.5*  PLT 875    Basic Metabolic Panel: Recent Labs  Lab 08/29/20 1314  NA 139  K 3.8  CL 102  CO2 28  GLUCOSE 116*  BUN 16  CREATININE 1.16*  CALCIUM 9.3    GFR: Estimated Creatinine Clearance: 66.6 mL/min (A) (by C-G formula based on SCr of 1.16 mg/dL (H)).  Liver Function Tests: No results for input(s): AST, ALT, ALKPHOS, BILITOT, PROT, ALBUMIN in the last 168 hours.  Urine analysis:    Component Value Date/Time   COLORURINE STRAW (A) 04/19/2019 1314   APPEARANCEUR CLEAR (A) 04/19/2019 1314   APPEARANCEUR Clear 04/27/2012 0958   LABSPEC 1.008 04/19/2019 1314   LABSPEC 1.014 04/27/2012 0958   PHURINE 5.0 04/19/2019 1314   GLUCOSEU NEGATIVE 04/19/2019 1314   GLUCOSEU Negative 04/27/2012 0958   HGBUR SMALL (A) 04/19/2019 1314   BILIRUBINUR  neg 02/16/2020 1134   BILIRUBINUR Negative 04/27/2012 [employer] 04/19/2019 1314   PROTEINUR Negative 02/16/2020 1134   PROTEINUR NEGATIVE 04/19/2019 1314   UROBILINOGEN 0.2 02/16/2020 1134   NITRITE neg 02/16/2020 1134   NITRITE NEGATIVE 04/19/2019 1314   LEUKOCYTESUR Negative 02/16/2020 1134   LEUKOCYTESUR NEGATIVE 04/19/2019 1314   LEUKOCYTESUR Negative 04/27/2012 0958    Radiological Exams on Admission: DG Chest 2 View  Result Date: 08/29/2020 CLINICAL DATA:  Shortness of breath EXAM: CHEST - 2 VIEW COMPARISON:  02/10/2020 FINDINGS: Cardiac shadow is within normal limits. The lungs are well aerated bilaterally. Mild bronchitic changes are again identified and similar to that seen on the prior exam likely of a more chronic nature. No focal infiltrate or effusion is seen. No bony abnormality is noted. IMPRESSION: Chronic bronchitic changes without acute abnormality. Electronically Signed   By: Inez Catalina M.D.   On: 08/29/2020 14:14   CT Angio Chest PE W and/or Wo Contrast  Result Date: 08/29/2020 CLINICAL DATA:  70 year old with shortness of breath and chest pain. EXAM: CT ANGIOGRAPHY CHEST WITH CONTRAST TECHNIQUE: Multidetector CT imaging of the chest was performed using the standard protocol during bolus administration of intravenous contrast. Multiplanar CT image reconstructions and MIPs were obtained to evaluate the vascular anatomy. CONTRAST:  20mL OMNIPAQUE IOHEXOL 350 MG/ML SOLN COMPARISON:  Chest CT 05/07/2019 FINDINGS: Cardiovascular: No evidence for filling defects in the main or lobar pulmonary arteries. Limited evaluation of the segmental pulmonary arteries and more distal branches. Heart size is within normal limits. No significant pericardial fluid. Atherosclerotic calcification involving thoracic aorta without aneurysm. Mediastinum/Nodes: No significant lymph node enlargement in the mediastinum or hilar regions. No axillary lymph node enlargement. Lungs/Pleura:  Trachea and mainstem bronchi are patent. No large pleural effusions. Patchy densities along the periphery of the right lower lobe on sequence 6 image 46 are new. Questionable tiny nodule in the periphery of the right upper lobe on sequence 6, image 18 is probably chronic. Ground-glass opacities in the  right upper lobe are similar to the previous examination may be related to areas of air trapping. Subtle ground-glass opacities in left upper lobe are nonspecific. Upper Abdomen: Cholecystectomy. No acute abnormality in the visualized upper abdomen. Musculoskeletal: No acute bone abnormality. Review of the MIP images confirms the above findings. IMPRESSION: 1. Subtle patchy densities in the right lower lobe. Findings could represent a small area of infection in the right lower lobe. Additional ground-glass areas in the upper lobes which could be related air trapping versus atelectasis. 2. No evidence for a large or central pulmonary embolism. Study has technical limitations but there is no evidence for pulmonary embolism in the main or lobar pulmonary arteries. 3.  Aortic Atherosclerosis (ICD10-I70.0). Electronically Signed   By: Markus Daft M.D.   On: 08/29/2020 15:47   EKG: Independently reviewed.  Sinus tachycardia at 106 bpm.  Low voltage in some leads.  Assessment/Plan Principal Problem:   Asthma in adult, moderate persistent, with acute exacerbation Active Problems:   BMI 50.0-59.9, adult (HCC)   OSA on CPAP   Acid reflux   Essential hypertension   IBS (irritable bowel syndrome)   Restless leg syndrome   B12 deficiency anemia  Asthma exacerbation/COPD exacerbation > Patient with a history of asthma and chronic bronchitis presenting with 3 days of shortness of breath nonresponsive to duo nebs at home. > Received Solu-Medrol, magnesium, albuterol and DuoNebs in the ED with some improvement however continued have significant tachycardia and shortness of breath with ambulation. > CTA chest did not  show large PE however there was some technical limitations.  CT did show patchy right lower lobe changes possibly consistent with pneumonia.  However there was no leukocytosis. > Suspect viral etiology will check Covid and flu screening as well as full viral panel. > Patient also with some significant obesity likely leaving her with poor respiratory reserve possible OHS. - Monitor on telemetry - Continue DuoNebs every 6 hours while awake - As needed albuterol neb - Prednisone 50 mg daily - Please home Symbicort with formulary Dulera - Received a dose of azithromycin in the ED - Follow-up Covid and flu screen as well as viral panel - Trend fever curve and white count - Check procalcitonin  GERD - Continue home PPI  OSA ?OHS - Continue home CPAP  IBS - Cholestyramine nightly  Hypertension - Continue home lisinopril  Anemia > Chronic anemia due to B12 deficiency.  Hemoglobin 10.2 in the ED which is stable. - Trend CBC  ROS - As needed ropinirole  DVT prophylaxis: Lovenox  Code Status:   Full Family Communication:  None on admission  Disposition Plan:   Patient is from:  Home  Anticipated DC to:  Home  Anticipated DC date:  1 to 3 days  Anticipated DC barriers: None  Consults called:  None  Admission status:  Observation, MedSurg with telemetry  Severity of Illness: The appropriate patient status for this patient is OBSERVATION. Observation status is judged to be reasonable and necessary in order to provide the required intensity of service to ensure the patient's safety. The patient's presenting symptoms, physical exam findings, and initial radiographic and laboratory data in the context of their medical condition is felt to place them at decreased risk for further clinical deterioration. Furthermore, it is anticipated that the patient will be medically stable for discharge from the hospital within 2 midnights of admission. The following factors support the patient status of  observation.   " The patient's presenting symptoms include shortness  of breath, wheezing, tachycardia. " The physical exam findings include wheezing, tachycardia. " The initial radiographic and laboratory data are hemoglobin 10.2 which is stable, creatinine 1.16 which is stable, D-dimer 0.87, BNP 44, chest x-ray with chronic bronchitic changes, CT PE with patchy right lower lobe changes but no evidence of PE.   Marcelyn Bruins MD [facility]  How to contact the [facility] Attending or Consulting provider [place] or covering provider during after hours [place], for this patient?   1. Check the care team in [facility] and look for a) attending/consulting [facility] provider listed and b) the [facility] team listed 2. Log into [email or web address] and use [place]'s universal password to access. If you do not have the password, please contact the hospital operator. 3. Locate the [facility] provider you are looking for under [facility] and page to a number that you can be directly reached. 4. If you still have difficulty reaching the provider, please page the [facility] (Director on Call) for the Hospitalists listed on amion for assistance.  08/29/2020, 8:29 PM

## 2020-08-29 NOTE — ED Notes (Signed)
PT to X-Ray at this time

## 2020-08-29 NOTE — ED Notes (Addendum)
Lab called to draw labs

## 2020-08-29 NOTE — Progress Notes (Signed)
PHARMACIST - PHYSICIAN COMMUNICATION  CONCERNING:  Enoxaparin (Lovenox) for DVT Prophylaxis    RECOMMENDATION: Patient was prescribed enoxaprin 40mg  q24 hours for VTE prophylaxis.   Filed Weights   08/29/20 1232  Weight: (!) 145.2 kg (320 lb)    Body mass index is 54.93 kg/m.  Estimated Creatinine Clearance: 66.6 mL/min (A) (by C-G formula based on SCr of 1.16 mg/dL (H)).   Based on [place] patient is candidate for enoxaparin 0.5mg /kg TBW SQ every 24 hours based on BMI being >30.  DESCRIPTION: Pharmacy has adjusted enoxaparin dose per [facility] policy.  Patient is now receiving enoxaparin 72.5 mg every 24 hours    Vira Blanco, PharmD Clinical Pharmacist  08/29/2020 9:49 PM

## 2020-08-30 ENCOUNTER — Ambulatory Visit: Payer: 59 | Admitting: Internal Medicine

## 2020-08-30 DIAGNOSIS — J4541 Moderate persistent asthma with (acute) exacerbation: Secondary | ICD-10-CM | POA: Diagnosis not present

## 2020-08-30 DIAGNOSIS — I10 Essential (primary) hypertension: Secondary | ICD-10-CM | POA: Diagnosis not present

## 2020-08-30 LAB — CBC
HCT: 33.9 % — ABNORMAL LOW (ref 36.0–46.0)
Hemoglobin: 10.7 g/dL — ABNORMAL LOW (ref 12.0–15.0)
MCH: 24.4 pg — ABNORMAL LOW (ref 26.0–34.0)
MCHC: 31.6 g/dL (ref 30.0–36.0)
MCV: 77.4 fL — ABNORMAL LOW (ref 80.0–100.0)
Platelets: 304 10*3/uL (ref 150–400)
RBC: 4.38 MIL/uL (ref 3.87–5.11)
RDW: 16 % — ABNORMAL HIGH (ref 11.5–15.5)
WBC: 8.9 10*3/uL (ref 4.0–10.5)
nRBC: 0 % (ref 0.0–0.2)

## 2020-08-30 LAB — HIV ANTIBODY (ROUTINE TESTING W REFLEX): HIV Screen 4th Generation wRfx: NONREACTIVE

## 2020-08-30 LAB — BASIC METABOLIC PANEL
Anion gap: 10 (ref 5–15)
BUN: 22 mg/dL (ref 8–23)
CO2: 27 mmol/L (ref 22–32)
Calcium: 9.3 mg/dL (ref 8.9–10.3)
Chloride: 101 mmol/L (ref 98–111)
Creatinine, Ser: 1.31 mg/dL — ABNORMAL HIGH (ref 0.44–1.00)
GFR, Estimated: 44 mL/min — ABNORMAL LOW (ref >60)
Glucose, Bld: 144 mg/dL — ABNORMAL HIGH (ref 70–99)
Potassium: 4.4 mmol/L (ref 3.5–5.1)
Sodium: 138 mmol/L (ref 135–145)

## 2020-08-30 LAB — SARS CORONAVIRUS 2 (TAT 6-24 HRS): SARS Coronavirus 2: NEGATIVE

## 2020-08-30 NOTE — Plan of Care (Signed)
  Problem: Clinical Measurements: Goal: Will remain free from infection 08/30/2020 0140 by Delight Ovens, RN Outcome: Progressing 08/30/2020 0140 by Delight Ovens, RN Outcome: Progressing

## 2020-08-30 NOTE — Progress Notes (Addendum)
Progress Note    Samantha Mason  WCH:[id number] DOB: June 22, 1950  DOA: 08/29/2020 PCP: Glean Hess, MD      Brief Narrative:    Medical records reviewed and are as summarized below:  Samantha Mason is a 70 y.o. female       Assessment/Plan:   Principal Problem:   Asthma in adult, moderate persistent, with acute exacerbation Active Problems:   BMI 50.0-59.9, adult (HCC)   OSA on CPAP   Acid reflux   Essential hypertension   IBS (irritable bowel syndrome)   Restless leg syndrome   B12 deficiency anemia    Body mass index is 54.93 kg/m.  (Morbid obesity)   Acute asthma exacerbation: Continue prednisone and bronchodilators. Oxygen saturation went down to 90% on room air with ambulation.  Taper off oxygen.  Hypertension: Continue lisinopril  OSA: Continue CPAP at night  Other comorbidities include GERD, vitamin B12 deficiency, anxiety, depression, history of stroke  Diet Order            Diet Heart Room service appropriate? Yes; Fluid consistency: Thin  Diet effective now                    Consultants:  None  Procedures:  None    Medications:   . cholestyramine light  4 g Oral QHS  . enoxaparin (LOVENOX) injection  0.5 mg/kg Subcutaneous Q24H  . ipratropium-albuterol  3 mL Nebulization Q6H WA  . lisinopril  10 mg Oral Daily  . loratadine  10 mg Oral Daily  . mometasone-formoterol  2 puff Inhalation BID  . pantoprazole  40 mg Oral Daily  . predniSONE  40 mg Oral Q breakfast  . sodium chloride flush  3 mL Intravenous Q12H   Continuous Infusions:   Anti-infectives (From admission, onward)   Start     Dose/Rate Route Frequency Ordered Stop   08/29/20 1900  azithromycin (ZITHROMAX) 500 mg in sodium chloride 0.9 % 250 mL IVPB        500 mg 250 mL/hr over 60 Minutes Intravenous  Once 08/29/20 1859 08/29/20 2157             Family Communication/Anticipated D/C date and plan/Code Status   DVT prophylaxis:       Code Status: Full Code  Family Communication: None Disposition Plan:    Status is: Observation  The patient will require care spanning > 2 midnights and should be moved to inpatient because: Inpatient level of care appropriate due to severity of illness  Dispo: The patient is from: Home              Anticipated d/c is to: Home              Patient currently is not medically stable to d/c.   Difficult to place patient No           Subjective:   C/o: Cough, wheezing and shortness of breath  Objective:    Vitals:   08/30/20 0312 08/30/20 0542 08/30/20 0757 08/30/20 0852  BP:  (!) 162/63  (!) 163/67  Pulse:  99  (!) 110  Resp:  20  16  Temp:  98.4 F (36.9 C)  98.4 F (36.9 C)  TempSrc:  Oral    SpO2: 92% 91% 96% 94%  Weight:      Height:       No data found.   Intake/Output Summary (Last 24 hours) at 08/30/2020 0915 Last data filed  at 08/29/2020 2157 Gross per 24 hour  Intake 349.22 ml  Output --  Net 349.22 ml   Filed Weights   08/29/20 1232  Weight: (!) 145.2 kg    Exam:  GEN: NAD SKIN: No rash EYES: EOMI ENT: MMM CV: RRR PULM: b/l wheezing.  No rales heard ABD: soft, ND, NT, +BS CNS: AAO x 3, non focal EXT: No edema or tenderness        Data Reviewed:   I have personally reviewed following labs and imaging studies:  Labs: Labs show the following:   Basic Metabolic Panel: Recent Labs  Lab 08/29/20 1314 08/30/20 0531  NA 139 138  K 3.8 4.4  CL 102 101  CO2 28 27  GLUCOSE 116* 144*  BUN 16 22  CREATININE 1.16* 1.31*  CALCIUM 9.3 9.3   GFR Estimated Creatinine Clearance: 59 mL/min (A) (by C-G formula based on SCr of 1.31 mg/dL (H)). Liver Function Tests: No results for input(s): AST, ALT, ALKPHOS, BILITOT, PROT, ALBUMIN in the last 168 hours. No results for input(s): LIPASE, AMYLASE in the last 168 hours. No results for input(s): AMMONIA in the last 168 hours. Coagulation profile No results for input(s): INR, PROTIME in  the last 168 hours.  CBC: Recent Labs  Lab 08/29/20 1314 08/30/20 0531  WBC 8.0 8.9  HGB 10.2* 10.7*  HCT 32.8* 33.9*  MCV 78.5* 77.4*  PLT 273 304   Cardiac Enzymes: No results for input(s): CKTOTAL, CKMB, CKMBINDEX, TROPONINI in the last 168 hours. BNP (last 3 results) No results for input(s): PROBNP in the last 8760 hours. CBG: No results for input(s): GLUCAP in the last 168 hours. D-Dimer: Recent Labs    08/29/20 1314  DDIMER 0.87*   Hgb A1c: No results for input(s): HGBA1C in the last 72 hours. Lipid Profile: No results for input(s): CHOL, HDL, LDLCALC, TRIG, CHOLHDL, LDLDIRECT in the last 72 hours. Thyroid function studies: No results for input(s): TSH, T4TOTAL, T3FREE, THYROIDAB in the last 72 hours.  Invalid input(s): FREET3 Anemia work up: No results for input(s): VITAMINB12, FOLATE, FERRITIN, TIBC, IRON, RETICCTPCT in the last 72 hours. Sepsis Labs: Recent Labs  Lab 08/29/20 1314 08/29/20 1515 08/30/20 0531  PROCALCITON  --  <0.10  --   WBC 8.0  --  8.9    Microbiology Recent Results (from the past 240 hour(s))  SARS CORONAVIRUS 2 (TAT 6-24 HRS) Nasopharyngeal Nasopharyngeal Swab     Status: None   Collection Time: 08/29/20  1:37 PM   Specimen: Nasopharyngeal Swab  Result Value Ref Range Status   SARS Coronavirus 2 NEGATIVE NEGATIVE Final    Comment: (NOTE) SARS-CoV-2 target nucleic acids are NOT DETECTED.  The SARS-CoV-2 RNA is generally detectable in upper and lower respiratory specimens during the acute phase of infection. Negative results do not preclude SARS-CoV-2 infection, do not rule out co-infections with other pathogens, and should not be used as the sole basis for treatment or other patient management decisions. Negative results must be combined with clinical observations, patient history, and epidemiological information. The expected result is Negative.  Fact Sheet for Patients: SugarRoll.be  Fact  Sheet for Healthcare Providers: https://www.woods-mathews.com/  This test is not yet approved or cleared by the [place] FDA and  has been authorized for detection and/or diagnosis of SARS-CoV-2 by FDA under an Emergency Use Authorization (EUA). This EUA will remain  in effect (meaning this test can be used) for the duration of the COVID-19 declaration under Se ction 564(b)(1) of the  Act, 21 U.S.C. section 360bbb-3(b)(1), unless the authorization is terminated or revoked sooner.  Performed at [facility], [place] [address], [place], [place] [address]   Culture, blood (Routine X 2) w Reflex to ID Panel     Status: None (Preliminary result)   Collection Time: 08/29/20  8:07 PM   Specimen: Left Antecubital; Blood  Result Value Ref Range Status   Specimen Description LEFT ANTECUBITAL  Final   Special Requests   Final    BOTTLES DRAWN AEROBIC AND ANAEROBIC Blood Culture adequate volume   Culture   Final    NO GROWTH < 12 HOURS Performed at [facility], [address], [place], [place] [address]    Report Status PENDING  Incomplete  Culture, blood (Routine X 2) w Reflex to ID Panel     Status: None (Preliminary result)   Collection Time: 08/29/20  8:11 PM   Specimen: BLOOD LEFT HAND  Result Value Ref Range Status   Specimen Description BLOOD LEFT HAND  Final   Special Requests   Final    BOTTLES DRAWN AEROBIC ONLY Blood Culture adequate volume   Culture   Final    NO GROWTH < 12 HOURS Performed at [facility], [address], [place], [place] [address]    Report Status PENDING  Incomplete    Procedures and diagnostic studies:  DG Chest 2 View  Result Date: 08/29/2020 CLINICAL DATA:  Shortness of breath EXAM: CHEST - 2 VIEW COMPARISON:  02/10/2020 FINDINGS: Cardiac shadow is within normal limits. The lungs are well aerated bilaterally. Mild bronchitic changes are again identified and similar to that seen on the prior exam likely of a more  chronic nature. No focal infiltrate or effusion is seen. No bony abnormality is noted. IMPRESSION: Chronic bronchitic changes without acute abnormality. Electronically Signed   By: Inez Catalina M.D.   On: 08/29/2020 14:14   CT Angio Chest PE W and/or Wo Contrast  Result Date: 08/29/2020 CLINICAL DATA:  70 year old with shortness of breath and chest pain. EXAM: CT ANGIOGRAPHY CHEST WITH CONTRAST TECHNIQUE: Multidetector CT imaging of the chest was performed using the standard protocol during bolus administration of intravenous contrast. Multiplanar CT image reconstructions and MIPs were obtained to evaluate the vascular anatomy. CONTRAST:  35mL OMNIPAQUE IOHEXOL 350 MG/ML SOLN COMPARISON:  Chest CT 05/07/2019 FINDINGS: Cardiovascular: No evidence for filling defects in the main or lobar pulmonary arteries. Limited evaluation of the segmental pulmonary arteries and more distal branches. Heart size is within normal limits. No significant pericardial fluid. Atherosclerotic calcification involving thoracic aorta without aneurysm. Mediastinum/Nodes: No significant lymph node enlargement in the mediastinum or hilar regions. No axillary lymph node enlargement. Lungs/Pleura: Trachea and mainstem bronchi are patent. No large pleural effusions. Patchy densities along the periphery of the right lower lobe on sequence 6 image 46 are new. Questionable tiny nodule in the periphery of the right upper lobe on sequence 6, image 18 is probably chronic. Ground-glass opacities in the right upper lobe are similar to the previous examination may be related to areas of air trapping. Subtle ground-glass opacities in left upper lobe are nonspecific. Upper Abdomen: Cholecystectomy. No acute abnormality in the visualized upper abdomen. Musculoskeletal: No acute bone abnormality. Review of the MIP images confirms the above findings. IMPRESSION: 1. Subtle patchy densities in the right lower lobe. Findings could represent a small area of  infection in the right lower lobe. Additional ground-glass areas in the upper lobes which could be related air trapping  versus atelectasis. 2. No evidence for a large or central pulmonary embolism. Study has technical limitations but there is no evidence for pulmonary embolism in the main or lobar pulmonary arteries. 3.  Aortic Atherosclerosis (ICD10-I70.0). Electronically Signed   By: Markus Daft M.D.   On: 08/29/2020 15:47               LOS: 0 days   Arthi Mcdonald  [facility]   Pager on [email or web address]. If 7PM-7AM, please contact night-coverage at [email or web address]     08/30/2020, 9:15 AM

## 2020-08-31 ENCOUNTER — Observation Stay (HOSPITAL_COMMUNITY)
Admit: 2020-08-31 | Discharge: 2020-08-31 | Disposition: A | Discharge status: Home / Self Care | Payer: 59 | Attending: Internal Medicine | Admitting: Internal Medicine

## 2020-08-31 ENCOUNTER — Observation Stay: Admit: 2020-08-31 | Payer: 59

## 2020-08-31 ENCOUNTER — Encounter: Payer: Self-pay | Admitting: Internal Medicine

## 2020-08-31 DIAGNOSIS — I10 Essential (primary) hypertension: Secondary | ICD-10-CM | POA: Diagnosis not present

## 2020-08-31 DIAGNOSIS — J4541 Moderate persistent asthma with (acute) exacerbation: Secondary | ICD-10-CM | POA: Diagnosis not present

## 2020-08-31 DIAGNOSIS — R0609 Other forms of dyspnea: Secondary | ICD-10-CM | POA: Diagnosis not present

## 2020-08-31 LAB — ECHOCARDIOGRAM COMPLETE
AR max vel: 2.26 cm2
AV Area VTI: 2.1 cm2
AV Area mean vel: 2.02 cm2
AV Mean grad: 4.5 mmHg
AV Peak grad: 8.1 mmHg
Ao pk vel: 1.42 m/s
Area-P 1/2: 6.96 cm2
Height: 64 in
S' Lateral: 2.63 cm
Weight: 5120 oz

## 2020-08-31 MED ORDER — LEVALBUTEROL HCL 0.63 MG/3ML IN NEBU
0.6300 mg | INHALATION_SOLUTION | Freq: Four times a day (QID) | RESPIRATORY_TRACT | Status: DC
  Administered 2020-08-31 – 2020-09-01 (×4): 0.63 mg via RESPIRATORY_TRACT
  Filled 2020-08-31 (×6): qty 3

## 2020-08-31 NOTE — Progress Notes (Signed)
*  PRELIMINARY RESULTS* Echocardiogram 2D Echocardiogram has been performed.  Samantha Mason 08/31/2020, 2:04 PM

## 2020-08-31 NOTE — Progress Notes (Addendum)
Progress Note    Samantha Mason  RDE:[id number] DOB: 1950-06-03  DOA: 08/29/2020 PCP: Glean Hess, MD      Brief Narrative:    Medical records reviewed and are as summarized below:  Samantha Mason is a 70 y.o. female       Assessment/Plan:   Principal Problem:   Asthma in adult, moderate persistent, with acute exacerbation Active Problems:   BMI 50.0-59.9, adult (HCC)   OSA on CPAP   Acid reflux   Essential hypertension   IBS (irritable bowel syndrome)   Restless leg syndrome   B12 deficiency anemia    Body mass index is 54.93 kg/m.  (Morbid obesity)   Acute asthma exacerbation: Switch DuoNeb to Xopenex.  Continue prednisone.  Obtain 2D echo for further evaluation.  Acute hypoxic respiratory failure: Oxygen saturation dropped to 85% on room air last night.  Continue 2 L/min oxygen and taper off oxygen as able.  Sinus tachycardia: Likely from asthma exacerbation.  Monitor heart rate closely.  Hypertension: Continue lisinopril  OSA: Continue CPAP at night  Other comorbidities include GERD, vitamin B12 deficiency, anxiety, depression, history of stroke  Diet Order            Diet Heart Room service appropriate? Yes; Fluid consistency: Thin  Diet effective now                    Consultants:  None  Procedures:  None    Medications:   . cholestyramine light  4 g Oral QHS  . enoxaparin (LOVENOX) injection  0.5 mg/kg Subcutaneous Q24H  . levalbuterol  0.63 mg Nebulization Q6H  . lisinopril  10 mg Oral Daily  . loratadine  10 mg Oral Daily  . mometasone-formoterol  2 puff Inhalation BID  . pantoprazole  40 mg Oral Daily  . predniSONE  40 mg Oral Q breakfast  . sodium chloride flush  3 mL Intravenous Q12H   Continuous Infusions:   Anti-infectives (From admission, onward)   Start     Dose/Rate Route Frequency Ordered Stop   08/29/20 1900  azithromycin (ZITHROMAX) 500 mg in sodium chloride 0.9 % 250 mL IVPB        500  mg 250 mL/hr over 60 Minutes Intravenous  Once 08/29/20 1859 08/29/20 2157             Family Communication/Anticipated D/C date and plan/Code Status   DVT prophylaxis:      Code Status: Full Code  Family Communication: None Disposition Plan:    Status is: Observation  The patient will require care spanning > 2 midnights and should be moved to inpatient because: Inpatient level of care appropriate due to severity of illness  Dispo: The patient is from: Home              Anticipated d/c is to: Home              Patient currently is not medically stable to d/c.   Difficult to place patient No           Subjective:   C/o cough, shortness of breath and wheezing.  Shortness of breath is worse with exertion.  Heart rate also goes up with mild activity.  She requested that albuterol was switched to Xopenex (levalbuterol) because she believes she had better response with this in the emergency room.  Objective:    Vitals:   08/30/20 1949 08/31/20 0014 08/31/20 0343 08/31/20 0839  BP: Marland Kitchen)  163/65 (!) 174/67 (!) 173/56 (!) 147/68  Pulse: (!) 104 (!) 101 (!) 110 (!) 107  Resp: 18 20 20 16   Temp: 98.1 F (36.7 C) 98.3 F (36.8 C) 98.2 F (36.8 C) 98.2 F (36.8 C)  TempSrc: Oral Oral Oral   SpO2: 97% 96% 96% 96%  Weight:      Height:       No data found.   Intake/Output Summary (Last 24 hours) at 08/31/2020 1508 Last data filed at 08/31/2020 1347 Gross per 24 hour  Intake 240 ml  Output 0 ml  Net 240 ml   Filed Weights   08/29/20 1232  Weight: (!) 145.2 kg    Exam:  GEN: NAD SKIN: Warm and dry EYES: EOMI ENT: MMM CV: RRR, tachycardic PULM: Decreased air entry bilaterally.  B/l wheezing.  No rales heard. ABD: soft, obese, NT, +BS CNS: AAO x 3, non focal EXT: No edema or tenderness        Data Reviewed:   I have personally reviewed following labs and imaging studies:  Labs: Labs show the following:   Basic Metabolic Panel: Recent  Labs  Lab 08/29/20 1314 08/30/20 0531  NA 139 138  K 3.8 4.4  CL 102 101  CO2 28 27  GLUCOSE 116* 144*  BUN 16 22  CREATININE 1.16* 1.31*  CALCIUM 9.3 9.3   GFR Estimated Creatinine Clearance: 59 mL/min (A) (by C-G formula based on SCr of 1.31 mg/dL (H)). Liver Function Tests: No results for input(s): AST, ALT, ALKPHOS, BILITOT, PROT, ALBUMIN in the last 168 hours. No results for input(s): LIPASE, AMYLASE in the last 168 hours. No results for input(s): AMMONIA in the last 168 hours. Coagulation profile No results for input(s): INR, PROTIME in the last 168 hours.  CBC: Recent Labs  Lab 08/29/20 1314 08/30/20 0531  WBC 8.0 8.9  HGB 10.2* 10.7*  HCT 32.8* 33.9*  MCV 78.5* 77.4*  PLT 273 304   Cardiac Enzymes: No results for input(s): CKTOTAL, CKMB, CKMBINDEX, TROPONINI in the last 168 hours. BNP (last 3 results) No results for input(s): PROBNP in the last 8760 hours. CBG: No results for input(s): GLUCAP in the last 168 hours. D-Dimer: Recent Labs    08/29/20 1314  DDIMER 0.87*   Hgb A1c: No results for input(s): HGBA1C in the last 72 hours. Lipid Profile: No results for input(s): CHOL, HDL, LDLCALC, TRIG, CHOLHDL, LDLDIRECT in the last 72 hours. Thyroid function studies: No results for input(s): TSH, T4TOTAL, T3FREE, THYROIDAB in the last 72 hours.  Invalid input(s): FREET3 Anemia work up: No results for input(s): VITAMINB12, FOLATE, FERRITIN, TIBC, IRON, RETICCTPCT in the last 72 hours. Sepsis Labs: Recent Labs  Lab 08/29/20 1314 08/29/20 1515 08/30/20 0531  PROCALCITON  --  <0.10  --   WBC 8.0  --  8.9    Microbiology Recent Results (from the past 240 hour(s))  SARS CORONAVIRUS 2 (TAT 6-24 HRS) Nasopharyngeal Nasopharyngeal Swab     Status: None   Collection Time: 08/29/20  1:37 PM   Specimen: Nasopharyngeal Swab  Result Value Ref Range Status   SARS Coronavirus 2 NEGATIVE NEGATIVE Final    Comment: (NOTE) SARS-CoV-2 target nucleic acids are NOT  DETECTED.  The SARS-CoV-2 RNA is generally detectable in upper and lower respiratory specimens during the acute phase of infection. Negative results do not preclude SARS-CoV-2 infection, do not rule out co-infections with other pathogens, and should not be used as the sole basis for treatment or other patient management  decisions. Negative results must be combined with clinical observations, patient history, and epidemiological information. The expected result is Negative.  Fact Sheet for Patients: SugarRoll.be  Fact Sheet for Healthcare Providers: https://www.woods-mathews.com/  This test is not yet approved or cleared by the [place] FDA and  has been authorized for detection and/or diagnosis of SARS-CoV-2 by FDA under an Emergency Use Authorization (EUA). This EUA will remain  in effect (meaning this test can be used) for the duration of the COVID-19 declaration under Se ction 564(b)(1) of the Act, 21 U.S.C. section 360bbb-3(b)(1), unless the authorization is terminated or revoked sooner.  Performed at [facility], [place] [address] [place], [place] [address]   Culture, blood (Routine X 2) w Reflex to ID Panel     Status: None (Preliminary result)   Collection Time: 08/29/20  8:07 PM   Specimen: Left Antecubital; Blood  Result Value Ref Range Status   Specimen Description LEFT ANTECUBITAL  Final   Special Requests   Final    BOTTLES DRAWN AEROBIC AND ANAEROBIC Blood Culture adequate volume   Culture   Final    NO GROWTH 2 DAYS Performed at [facility], [address], [place], [place] [address]    Report Status PENDING  Incomplete  Culture, blood (Routine X 2) w Reflex to ID Panel     Status: None (Preliminary result)   Collection Time: 08/29/20  8:11 PM   Specimen: BLOOD LEFT HAND  Result Value Ref Range Status   Specimen Description BLOOD LEFT HAND  Final   Special Requests   Final    BOTTLES DRAWN AEROBIC  ONLY Blood Culture adequate volume   Culture   Final    NO GROWTH 2 DAYS Performed at [facility], [address] [place], [place] [address]    Report Status PENDING  Incomplete    Procedures and diagnostic studies:  CT Angio Chest PE W and/or Wo Contrast  Result Date: 08/29/2020 CLINICAL DATA:  70 year old with shortness of breath and chest pain. EXAM: CT ANGIOGRAPHY CHEST WITH CONTRAST TECHNIQUE: Multidetector CT imaging of the chest was performed using the standard protocol during bolus administration of intravenous contrast. Multiplanar CT image reconstructions and MIPs were obtained to evaluate the vascular anatomy. CONTRAST:  44mL OMNIPAQUE IOHEXOL 350 MG/ML SOLN COMPARISON:  Chest CT 05/07/2019 FINDINGS: Cardiovascular: No evidence for filling defects in the main or lobar pulmonary arteries. Limited evaluation of the segmental pulmonary arteries and more distal branches. Heart size is within normal limits. No significant pericardial fluid. Atherosclerotic calcification involving thoracic aorta without aneurysm. Mediastinum/Nodes: No significant lymph node enlargement in the mediastinum or hilar regions. No axillary lymph node enlargement. Lungs/Pleura: Trachea and mainstem bronchi are patent. No large pleural effusions. Patchy densities along the periphery of the right lower lobe on sequence 6 image 46 are new. Questionable tiny nodule in the periphery of the right upper lobe on sequence 6, image 18 is probably chronic. Ground-glass opacities in the right upper lobe are similar to the previous examination may be related to areas of air trapping. Subtle ground-glass opacities in left upper lobe are nonspecific. Upper Abdomen: Cholecystectomy. No acute abnormality in the visualized upper abdomen. Musculoskeletal: No acute bone abnormality. Review of the MIP images confirms the above findings. IMPRESSION: 1. Subtle patchy densities in the right lower lobe. Findings could represent a small  area of infection in the right lower lobe. Additional ground-glass areas in the upper lobes which could be related air trapping versus atelectasis.  2. No evidence for a large or central pulmonary embolism. Study has technical limitations but there is no evidence for pulmonary embolism in the main or lobar pulmonary arteries. 3.  Aortic Atherosclerosis (ICD10-I70.0). Electronically Signed   By: Markus Daft M.D.   On: 08/29/2020 15:47               LOS: 0 days   Princessa Lesmeister  [facility]   Pager on [email or web address]. If 7PM-7AM, please contact night-coverage at [email or web address]     08/31/2020, 3:08 PM

## 2020-09-01 DIAGNOSIS — Z6841 Body Mass Index (BMI) 40.0 and over, adult: Secondary | ICD-10-CM | POA: Diagnosis not present

## 2020-09-01 DIAGNOSIS — J449 Chronic obstructive pulmonary disease, unspecified: Secondary | ICD-10-CM | POA: Diagnosis present

## 2020-09-01 DIAGNOSIS — Z888 Allergy status to other drugs, medicaments and biological substances status: Secondary | ICD-10-CM | POA: Diagnosis not present

## 2020-09-01 DIAGNOSIS — I10 Essential (primary) hypertension: Secondary | ICD-10-CM | POA: Diagnosis not present

## 2020-09-01 DIAGNOSIS — Z20822 Contact with and (suspected) exposure to covid-19: Secondary | ICD-10-CM | POA: Diagnosis present

## 2020-09-01 DIAGNOSIS — J4541 Moderate persistent asthma with (acute) exacerbation: Secondary | ICD-10-CM | POA: Diagnosis present

## 2020-09-01 DIAGNOSIS — F419 Anxiety disorder, unspecified: Secondary | ICD-10-CM | POA: Diagnosis present

## 2020-09-01 DIAGNOSIS — M1990 Unspecified osteoarthritis, unspecified site: Secondary | ICD-10-CM | POA: Diagnosis present

## 2020-09-01 DIAGNOSIS — J9601 Acute respiratory failure with hypoxia: Secondary | ICD-10-CM | POA: Diagnosis present

## 2020-09-01 DIAGNOSIS — F32A Depression, unspecified: Secondary | ICD-10-CM | POA: Diagnosis present

## 2020-09-01 DIAGNOSIS — K589 Irritable bowel syndrome without diarrhea: Secondary | ICD-10-CM | POA: Diagnosis present

## 2020-09-01 DIAGNOSIS — N1831 Chronic kidney disease, stage 3a: Secondary | ICD-10-CM | POA: Diagnosis present

## 2020-09-01 DIAGNOSIS — Z8701 Personal history of pneumonia (recurrent): Secondary | ICD-10-CM | POA: Diagnosis not present

## 2020-09-01 DIAGNOSIS — E538 Deficiency of other specified B group vitamins: Secondary | ICD-10-CM | POA: Diagnosis present

## 2020-09-01 DIAGNOSIS — J45901 Unspecified asthma with (acute) exacerbation: Secondary | ICD-10-CM | POA: Diagnosis present

## 2020-09-01 DIAGNOSIS — Z9851 Tubal ligation status: Secondary | ICD-10-CM | POA: Diagnosis not present

## 2020-09-01 DIAGNOSIS — Z825 Family history of asthma and other chronic lower respiratory diseases: Secondary | ICD-10-CM | POA: Diagnosis not present

## 2020-09-01 DIAGNOSIS — D519 Vitamin B12 deficiency anemia, unspecified: Secondary | ICD-10-CM | POA: Diagnosis present

## 2020-09-01 DIAGNOSIS — G2581 Restless legs syndrome: Secondary | ICD-10-CM | POA: Diagnosis present

## 2020-09-01 DIAGNOSIS — Z85038 Personal history of other malignant neoplasm of large intestine: Secondary | ICD-10-CM | POA: Diagnosis not present

## 2020-09-01 DIAGNOSIS — G4733 Obstructive sleep apnea (adult) (pediatric): Secondary | ICD-10-CM | POA: Diagnosis present

## 2020-09-01 DIAGNOSIS — I129 Hypertensive chronic kidney disease with stage 1 through stage 4 chronic kidney disease, or unspecified chronic kidney disease: Secondary | ICD-10-CM | POA: Diagnosis present

## 2020-09-01 DIAGNOSIS — K219 Gastro-esophageal reflux disease without esophagitis: Secondary | ICD-10-CM | POA: Diagnosis present

## 2020-09-01 DIAGNOSIS — Z880 Allergy status to penicillin: Secondary | ICD-10-CM | POA: Diagnosis not present

## 2020-09-01 DIAGNOSIS — Z8673 Personal history of transient ischemic attack (TIA), and cerebral infarction without residual deficits: Secondary | ICD-10-CM | POA: Diagnosis not present

## 2020-09-01 LAB — BASIC METABOLIC PANEL
Anion gap: 9 (ref 5–15)
BUN: 30 mg/dL — ABNORMAL HIGH (ref 8–23)
CO2: 26 mmol/L (ref 22–32)
Calcium: 9 mg/dL (ref 8.9–10.3)
Chloride: 104 mmol/L (ref 98–111)
Creatinine, Ser: 1.36 mg/dL — ABNORMAL HIGH (ref 0.44–1.00)
GFR, Estimated: 42 mL/min — ABNORMAL LOW (ref >60)
Glucose, Bld: 121 mg/dL — ABNORMAL HIGH (ref 70–99)
Potassium: 3.7 mmol/L (ref 3.5–5.1)
Sodium: 139 mmol/L (ref 135–145)

## 2020-09-01 MED ORDER — FUROSEMIDE 10 MG/ML IJ SOLN
20.0000 mg | Freq: Once | INTRAMUSCULAR | Status: AC
  Administered 2020-09-01: 20 mg via INTRAVENOUS
  Filled 2020-09-01: qty 4

## 2020-09-01 MED ORDER — LEVALBUTEROL HCL 0.63 MG/3ML IN NEBU
0.6300 mg | INHALATION_SOLUTION | Freq: Three times a day (TID) | RESPIRATORY_TRACT | Status: DC
  Administered 2020-09-01 – 2020-09-02 (×3): 0.63 mg via RESPIRATORY_TRACT
  Filled 2020-09-01 (×5): qty 3

## 2020-09-01 NOTE — Progress Notes (Addendum)
Progress Note    Samantha Mason  CNO:[id number] DOB: 06-09-1950  DOA: 08/29/2020 PCP: Glean Hess, MD      Brief Narrative:    Medical records reviewed and are as summarized below:  Samantha Mason is a 70 y.o. female       Assessment/Plan:   Principal Problem:   Asthma in adult, moderate persistent, with acute exacerbation Active Problems:   BMI 50.0-59.9, adult (HCC)   OSA on CPAP   Acid reflux   Essential hypertension   IBS (irritable bowel syndrome)   Restless leg syndrome   B12 deficiency anemia    Body mass index is 54.93 kg/m.  (Morbid obesity)   Acute asthma exacerbation: Continue Xopenex and prednisone.  Consult pulmonologist to assist with management.   2D echo showed grade 1 diastolic dysfunction and EF estimated at 50 to 55%.  Patient was given IV Lasix 20 mg x 1 dose today.  Discussed management of diastolic dysfunction and the importance of positive lifestyle changes including weight loss.  Acute hypoxic respiratory failure: She remains on 2 L/min oxygen by nasal cannula.  Taper off oxygen as able.  Sinus tachycardia: Overall, heart rate is better.   Hypertension: Continue lisinopril  CKD stage IIIa: Creatinine is trending up.  Continue to monitor.  OSA: Continue CPAP at night  Other comorbidities include GERD, vitamin B12 deficiency, anxiety, depression, history of stroke  Diet Order            Diet Heart Room service appropriate? Yes; Fluid consistency: Thin  Diet effective now                    Consultants:  Consult pulmonologist  Procedures:  None    Medications:   . cholestyramine light  4 g Oral QHS  . enoxaparin (LOVENOX) injection  0.5 mg/kg Subcutaneous Q24H  . levalbuterol  0.63 mg Nebulization TID  . lisinopril  10 mg Oral Daily  . loratadine  10 mg Oral Daily  . mometasone-formoterol  2 puff Inhalation BID  . pantoprazole  40 mg Oral Daily  . predniSONE  40 mg Oral Q breakfast  . sodium  chloride flush  3 mL Intravenous Q12H   Continuous Infusions:   Anti-infectives (From admission, onward)   Start     Dose/Rate Route Frequency Ordered Stop   08/29/20 1900  azithromycin (ZITHROMAX) 500 mg in sodium chloride 0.9 % 250 mL IVPB        500 mg 250 mL/hr over 60 Minutes Intravenous  Once 08/29/20 1859 08/29/20 2157             Family Communication/Anticipated D/C date and plan/Code Status   DVT prophylaxis:      Code Status: Full Code  Family Communication: None Disposition Plan:    Status is: Inpatient  Remains inpatient appropriate because:Inpatient level of care appropriate due to severity of illness   Dispo: The patient is from: Home              Anticipated d/c is to: Home              Patient currently is not medically stable to d/c.   Difficult to place patient No                 Subjective:   C/o coughing spells, chest tightness and shortness of breath with activity.  She thinks Xopenex works better for her than DuoNeb.  Objective:    Vitals:  09/01/20 0243 09/01/20 0520 09/01/20 0746 09/01/20 1136  BP:  (!) 151/81 (!) 157/87 (!) 173/82  Pulse: 98 81 82 81  Resp: 16 18 18 18   Temp:  97.8 F (36.6 C) 97.9 F (36.6 C) 97.8 F (36.6 C)  TempSrc:      SpO2: 95% 92% 93% 94%  Weight:      Height:       No data found.   Intake/Output Summary (Last 24 hours) at 09/01/2020 1423 Last data filed at 09/01/2020 0959 Gross per 24 hour  Intake 240 ml  Output --  Net 240 ml   Filed Weights   08/29/20 1232  Weight: (!) 145.2 kg    Exam:  GEN: NAD SKIN: No rash EYES: EOMI ENT: MMM CV: RRR PULM: Decreased air entry bilaterally.  Diffuse bilateral expiratory wheezing.  No rales heard. ABD: soft, obese, NT, +BS CNS: AAO x 3, non focal EXT: No edema or tenderness       Data Reviewed:   I have personally reviewed following labs and imaging studies:  Labs: Labs show the following:   Basic Metabolic  Panel: Recent Labs  Lab 08/29/20 1314 08/30/20 0531 09/01/20 1026  NA 139 138 139  K 3.8 4.4 3.7  CL 102 101 104  CO2 28 27 26   GLUCOSE 116* 144* 121*  BUN 16 22 30*  CREATININE 1.16* 1.31* 1.36*  CALCIUM 9.3 9.3 9.0   GFR Estimated Creatinine Clearance: 56.8 mL/min (A) (by C-G formula based on SCr of 1.36 mg/dL (H)). Liver Function Tests: No results for input(s): AST, ALT, ALKPHOS, BILITOT, PROT, ALBUMIN in the last 168 hours. No results for input(s): LIPASE, AMYLASE in the last 168 hours. No results for input(s): AMMONIA in the last 168 hours. Coagulation profile No results for input(s): INR, PROTIME in the last 168 hours.  CBC: Recent Labs  Lab 08/29/20 1314 08/30/20 0531  WBC 8.0 8.9  HGB 10.2* 10.7*  HCT 32.8* 33.9*  MCV 78.5* 77.4*  PLT 273 304   Cardiac Enzymes: No results for input(s): CKTOTAL, CKMB, CKMBINDEX, TROPONINI in the last 168 hours. BNP (last 3 results) No results for input(s): PROBNP in the last 8760 hours. CBG: No results for input(s): GLUCAP in the last 168 hours. D-Dimer: No results for input(s): DDIMER in the last 72 hours. Hgb A1c: No results for input(s): HGBA1C in the last 72 hours. Lipid Profile: No results for input(s): CHOL, HDL, LDLCALC, TRIG, CHOLHDL, LDLDIRECT in the last 72 hours. Thyroid function studies: No results for input(s): TSH, T4TOTAL, T3FREE, THYROIDAB in the last 72 hours.  Invalid input(s): FREET3 Anemia work up: No results for input(s): VITAMINB12, FOLATE, FERRITIN, TIBC, IRON, RETICCTPCT in the last 72 hours. Sepsis Labs: Recent Labs  Lab 08/29/20 1314 08/29/20 1515 08/30/20 0531  PROCALCITON  --  <0.10  --   WBC 8.0  --  8.9    Microbiology Recent Results (from the past 240 hour(s))  SARS CORONAVIRUS 2 (TAT 6-24 HRS) Nasopharyngeal Nasopharyngeal Swab     Status: None   Collection Time: 08/29/20  1:37 PM   Specimen: Nasopharyngeal Swab  Result Value Ref Range Status   SARS Coronavirus 2 NEGATIVE  NEGATIVE Final    Comment: (NOTE) SARS-CoV-2 target nucleic acids are NOT DETECTED.  The SARS-CoV-2 RNA is generally detectable in upper and lower respiratory specimens during the acute phase of infection. Negative results do not preclude SARS-CoV-2 infection, do not rule out co-infections with other pathogens, and should not be used as  the sole basis for treatment or other patient management decisions. Negative results must be combined with clinical observations, patient history, and epidemiological information. The expected result is Negative.  Fact Sheet for Patients: SugarRoll.be  Fact Sheet for Healthcare Providers: https://www.woods-mathews.com/  This test is not yet approved or cleared by the [place] FDA and  has been authorized for detection and/or diagnosis of SARS-CoV-2 by FDA under an Emergency Use Authorization (EUA). This EUA will remain  in effect (meaning this test can be used) for the duration of the COVID-19 declaration under Se ction 564(b)(1) of the Act, 21 U.S.C. section 360bbb-3(b)(1), unless the authorization is terminated or revoked sooner.  Performed at [facility], [place] [address], [place], [place] [address]   Culture, blood (Routine X 2) w Reflex to ID Panel     Status: None (Preliminary result)   Collection Time: 08/29/20  8:07 PM   Specimen: Left Antecubital; Blood  Result Value Ref Range Status   Specimen Description LEFT ANTECUBITAL  Final   Special Requests   Final    BOTTLES DRAWN AEROBIC AND ANAEROBIC Blood Culture adequate volume   Culture   Final    NO GROWTH 3 DAYS Performed at [facility], [address] [place], [place] [address]    Report Status PENDING  Incomplete  Culture, blood (Routine X 2) w Reflex to ID Panel     Status: None (Preliminary result)   Collection Time: 08/29/20  8:11 PM   Specimen: BLOOD LEFT HAND  Result Value Ref Range Status   Specimen Description  BLOOD LEFT HAND  Final   Special Requests   Final    BOTTLES DRAWN AEROBIC ONLY Blood Culture adequate volume   Culture   Final    NO GROWTH 3 DAYS Performed at [facility], [address], [place], [place] [address]    Report Status PENDING  Incomplete    Procedures and diagnostic studies:  ECHOCARDIOGRAM COMPLETE  Result Date: 08/31/2020    ECHOCARDIOGRAM REPORT   Patient Name:   FRAYDA EGLEY Date of Exam: 08/31/2020 Medical Rec #:  [id number]        Height:       64.0 in Accession #:    [id number]       Weight:       320.0 lb Date of Birth:  1950-11-11       BSA:          2.389 m Patient Age:    40 years         BP:           147/68 mmHg Patient Gender: F                HR:           107 bpm. Exam Location:  [facility] Procedure: 2D Echo, Cardiac Doppler and Color Doppler Indications:     Dyspnea R06.00  History:         Patient has no prior history of Echocardiogram examinations.                  COPD and Stroke; Risk Factors:Hypertension.  Sonographer:     Sherrie Sport RDCS (AE) Referring Phys:  [id number] Jennye Boroughs Diagnosing Phys: Ida Rogue MD  Sonographer Comments: Suboptimal apical window and Technically challenging study due to limited acoustic windows. IMPRESSIONS  1. Left ventricular ejection fraction, by estimation, is 55 to 60%. The left ventricle has normal function. The left ventricle  has no regional wall motion abnormalities. Left ventricular diastolic parameters are consistent with Grade I diastolic dysfunction (impaired relaxation).  2. Right ventricular systolic function is normal. The right ventricular size is normal.  3. Challenging image quality. FINDINGS  Left Ventricle: Left ventricular ejection fraction, by estimation, is 55 to 60%. The left ventricle has normal function. The left ventricle has no regional wall motion abnormalities. The left ventricular internal cavity size was normal in size. There is  no left ventricular hypertrophy. Left ventricular diastolic  parameters are consistent with Grade I diastolic dysfunction (impaired relaxation). Right Ventricle: The right ventricular size is normal. No increase in right ventricular wall thickness. Right ventricular systolic function is normal. Left Atrium: Left atrial size was normal in size. Right Atrium: Right atrial size was normal in size. Pericardium: There is no evidence of pericardial effusion. Mitral Valve: The mitral valve is normal in structure. No evidence of mitral valve regurgitation. No evidence of mitral valve stenosis. Tricuspid Valve: The tricuspid valve is normal in structure. Tricuspid valve regurgitation is not demonstrated. No evidence of tricuspid stenosis. Aortic Valve: The aortic valve was not well visualized. Aortic valve regurgitation is not visualized. No aortic stenosis is present. Aortic valve mean gradient measures 4.5 mmHg. Aortic valve peak gradient measures 8.1 mmHg. Aortic valve area, by VTI measures 2.10 cm. Pulmonic Valve: The pulmonic valve was normal in structure. Pulmonic valve regurgitation is not visualized. No evidence of pulmonic stenosis. Aorta: The aortic root is normal in size and structure. Venous: The inferior vena cava is normal in size with greater than 50% respiratory variability, suggesting right atrial pressure of 3 mmHg. IAS/Shunts: No atrial level shunt detected by color flow Doppler.  LEFT VENTRICLE PLAX 2D LVIDd:         3.83 cm  Diastology LVIDs:         2.63 cm  LV e' medial:    5.55 cm/s LV PW:         1.33 cm  LV E/e' medial:  19.8 LV IVS:        1.04 cm  LV e' lateral:   6.85 cm/s LVOT diam:     2.00 cm  LV E/e' lateral: 16.1 LV SV:         61 LV SV Index:   25 LVOT Area:     3.14 cm  RIGHT VENTRICLE RV Basal diam:  3.33 cm RV S prime:     20.70 cm/s TAPSE (M-mode): 5.3 cm LEFT ATRIUM             Index       RIGHT ATRIUM           Index LA diam:        3.90 cm 1.63 cm/m  RA Area:     15.20 cm LA Vol (A2C):   51.9 ml 21.73 ml/m RA Volume:   33.90 ml  14.19  ml/m LA Vol (A4C):   47.9 ml 20.05 ml/m LA Biplane Vol: 49.9 ml 20.89 ml/m  AORTIC VALVE AV Area (Vmax):    2.26 cm AV Area (Vmean):   2.02 cm AV Area (VTI):     2.10 cm AV Vmax:           142.00 cm/s AV Vmean:          98.850 cm/s AV VTI:            0.289 m AV Peak Grad:      8.1 mmHg AV Mean Grad:  4.5 mmHg LVOT Vmax:         102.00 cm/s LVOT Vmean:        63.700 cm/s LVOT VTI:          0.193 m LVOT/AV VTI ratio: 0.67  AORTA Ao Root diam: 3.10 cm MITRAL VALVE                TRICUSPID VALVE MV Area (PHT): 6.96 cm     TR Peak grad:   11.7 mmHg MV Decel Time: 109 msec     TR Vmax:        171.00 cm/s MV E velocity: 110.00 cm/s MV A velocity: 144.00 cm/s  SHUNTS MV E/A ratio:  0.76         Systemic VTI:  0.19 m                             Systemic Diam: 2.00 cm Ida Rogue MD Electronically signed by Ida Rogue MD Signature Date/Time: 08/31/2020/5:29:21 PM    Final                LOS: 0 days   Azrielle Springsteen  [facility]   Pager on [email or web address]. If 7PM-7AM, please contact night-coverage at [email or web address]     09/01/2020, 2:23 PM

## 2020-09-02 MED ORDER — NYSTATIN 100000 UNIT/GM EX POWD
Freq: Two times a day (BID) | CUTANEOUS | Status: DC
  Filled 2020-09-02: qty 15

## 2020-09-02 NOTE — Discharge Instructions (Signed)
Asthma, Adult  Asthma is a long-term (chronic) condition in which the airways get tight and narrow. The airways are the breathing passages that lead from the nose and mouth down into the lungs. A person with asthma will have times when symptoms get worse. These are called asthma attacks. They can cause coughing, whistling sounds when you breathe (wheezing), shortness of breath, and chest pain. They can make it hard to breathe. There is no cure for asthma, but medicines and lifestyle changes can help control it. There are many things that can bring on an asthma attack or make asthma symptoms worse (triggers). Common triggers include:  Mold.  Dust.  Cigarette smoke.  Cockroaches.  Things that can cause allergy symptoms (allergens). These include animal skin flakes (dander) and pollen from trees or grass.  Things that pollute the air. These may include household cleaners, wood smoke, smog, or chemical odors.  Cold air, weather changes, and wind.  Crying or laughing hard.  Stress.  Certain medicines or drugs.  Certain foods such as dried fruit, potato chips, and grape juice.  Infections, such as a cold or the flu.  Certain medical conditions or diseases.  Exercise or tiring activities. Asthma may be treated with medicines and by staying away from the things that cause asthma attacks. Types of medicines may include:  Controller medicines. These help prevent asthma symptoms. They are usually taken every day.  Fast-acting reliever or rescue medicines. These quickly relieve asthma symptoms. They are used as needed and provide short-term relief.  Allergy medicines if your attacks are brought on by allergens.  Medicines to help control the body's defense (immune) system. Follow these instructions at home: Avoiding triggers in your home  Change your heating and air conditioning filter often.  Limit your use of fireplaces and wood stoves.  Get rid of pests (such as roaches and  mice) and their droppings.  Throw away plants if you see mold on them.  Clean your floors. Dust regularly. Use cleaning products that do not smell.  Have someone vacuum when you are not home. Use a vacuum cleaner with a HEPA filter if possible.  Replace carpet with wood, tile, or vinyl flooring. Carpet can trap animal skin flakes and dust.  Use allergy-proof pillows, mattress covers, and box spring covers.  Wash bed sheets and blankets every week in hot water. Dry them in a dryer.  Keep your bedroom free of any triggers.  Avoid pets and keep windows closed when things that cause allergy symptoms are in the air.  Use blankets that are made of polyester or cotton.  Clean bathrooms and kitchens with bleach. If possible, have someone repaint the walls in these rooms with mold-resistant paint. Keep out of the rooms that are being cleaned and painted.  Wash your hands often with soap and water. If soap and water are not available, use hand sanitizer.  Do not allow anyone to smoke in your home. General instructions  Take over-the-counter and prescription medicines only as told by your doctor. ? Talk with your doctor if you have questions about how or when to take your medicines. ? Make note if you need to use your medicines more often than usual.  Do not use any products that contain nicotine or tobacco, such as cigarettes and e-cigarettes. If you need help quitting, ask your doctor.  Stay away from secondhand smoke.  Avoid doing things outdoors when allergen counts are high and when air quality is low.  Wear a ski mask   when doing outdoor activities in the winter. The mask should cover your nose and mouth. Exercise indoors on cold days if you can.  Warm up before you exercise. Take time to cool down after exercise.  Use a peak flow meter as told by your doctor. A peak flow meter is a tool that measures how well the lungs are working.  Keep track of the peak flow meter's readings.  Write them down.  Follow your asthma action plan. This is a written plan for taking care of your asthma and treating your attacks.  Make sure you get all the shots (vaccines) that your doctor recommends. Ask your doctor about a flu shot and a pneumonia shot.  Keep all follow-up visits as told by your doctor. This is important. Contact a doctor if:  You have wheezing, shortness of breath, or a cough even while taking medicine to prevent attacks.  The mucus you cough up (sputum) is thicker than usual.  The mucus you cough up changes from clear or white to yellow, green, gray, or bloody.  You have problems from the medicine you are taking, such as: ? A rash. ? Itching. ? Swelling. ? Trouble breathing.  You need reliever medicines more than 2-3 times a week.  Your peak flow reading is still at 50-79% of your personal best after following the action plan for 1 hour.  You have a fever. Get help right away if:  You seem to be worse and are not responding to medicine during an asthma attack.  You are short of breath even at rest.  You get short of breath when doing very little activity.  You have trouble eating, drinking, or talking.  You have chest pain or tightness.  You have a fast heartbeat.  Your lips or fingernails start to turn blue.  You are light-headed or dizzy, or you faint.  Your peak flow is less than 50% of your personal best.  You feel too tired to breathe normally. Summary  Asthma is a long-term (chronic) condition in which the airways get tight and narrow. An asthma attack can make it hard to breathe.  Asthma cannot be cured, but medicines and lifestyle changes can help control it.  Make sure you understand how to avoid triggers and how and when to use your medicines. This information is not intended to replace advice given to you by your health care provider. Make sure you discuss any questions you have with your health care provider. Document Revised:  09/08/2019 Document Reviewed: 09/08/2019 Elsevier Patient Education  2021 [employer]   

## 2020-09-02 NOTE — Discharge Summary (Signed)
Physician Discharge Summary  KYRSTYN GREEAR FBP:[id number] DOB: 08-28-1950 DOA: 08/29/2020  PCP: Glean Hess, MD  Admit date: 08/29/2020 Discharge date: 09/02/2020  Discharge disposition: Home   Recommendations for Outpatient Follow-Up:   Follow-up with PCP or pulmonologist in 1 week   Discharge Diagnosis:   Principal Problem:   Asthma in adult, moderate persistent, with acute exacerbation Active Problems:   BMI 50.0-59.9, adult (HCC)   OSA on CPAP   Acid reflux   Essential hypertension   IBS (irritable bowel syndrome)   Restless leg syndrome   B12 deficiency anemia   Acute asthma exacerbation    Discharge Condition: Stable.  Diet recommendation:  Diet Order            Diet - low sodium heart healthy           Diet Heart Room service appropriate? Yes; Fluid consistency: Thin  Diet effective now                   Code Status: Full Code     Hospital Course:   Ms. Nekeshia Lenhardt is a 70 year old man with medical history significant for asthma, morbid obesity, anxiety, depression, GERD, vitamin B12 deficiency, hypertension IBS, OSA, restless leg syndrome, colon cancer, arthritis.  She presented to the hospital because of increasing shortness of breath.  Shortness of breath is particularly worse with exertion.  She had been using her nebulizer inhalers at home but symptoms did not improve.  She was admitted to the hospital for acute asthma exacerbation. She was treated with steroids and bronchodilators.  She developed acute hypoxic respiratory requiring oxygen therapy.  Symptoms have slowly improved and she has been able to ambulate without any problems prior to discharge.     Discharge Exam:    Vitals:   09/01/20 2057 09/02/20 0547 09/02/20 0832 09/02/20 0907  BP: (!) 168/76 (!) 146/68  (!) 150/62  Pulse: 87 79 86 85  Resp: 18 18 18 18   Temp: 98 F (36.7 C) 97.8 F (36.6 C)  98.3 F (36.8 C)  TempSrc:      SpO2: 94% 99% 93% 94%  Weight:       Height:         GEN: NAD SKIN: Warm and dry EYES: EOMI ENT: MMM CV: RRR PULM: Mild occasional wheezing. No rales heard ABD: soft, obese, NT, +BS CNS: AAO x 3, non focal EXT: No edema or tenderness   The results of significant diagnostics from this hospitalization (including imaging, microbiology, ancillary and laboratory) are listed below for reference.     Procedures and Diagnostic Studies:   ECHOCARDIOGRAM COMPLETE  Result Date: 08/31/2020    ECHOCARDIOGRAM REPORT   Patient Name:   CAYLIN NASS Date of Exam: 08/31/2020 Medical Rec #:  [id number]        Height:       64.0 in Accession #:    [id number]       Weight:       320.0 lb Date of Birth:  1950-12-21       BSA:          2.389 m Patient Age:    70 years         BP:           147/68 mmHg Patient Gender: F                HR:           107 bpm. Exam Location:  [facility] Procedure: 2D Echo, Cardiac Doppler and Color Doppler Indications:     Dyspnea R06.00  History:         Patient has no prior history of Echocardiogram examinations.                  COPD and Stroke; Risk Factors:Hypertension.  Sonographer:     Sherrie Sport RDCS (AE) Referring Phys:  [id number] Jennye Boroughs Diagnosing Phys: Ida Rogue MD  Sonographer Comments: Suboptimal apical window and Technically challenging study due to limited acoustic windows. IMPRESSIONS  1. Left ventricular ejection fraction, by estimation, is 55 to 60%. The left ventricle has normal function. The left ventricle has no regional wall motion abnormalities. Left ventricular diastolic parameters are consistent with Grade I diastolic dysfunction (impaired relaxation).  2. Right ventricular systolic function is normal. The right ventricular size is normal.  3. Challenging image quality. FINDINGS  Left Ventricle: Left ventricular ejection fraction, by estimation, is 55 to 60%. The left ventricle has normal function. The left ventricle has no regional wall motion abnormalities. The left ventricular internal  cavity size was normal in size. There is  no left ventricular hypertrophy. Left ventricular diastolic parameters are consistent with Grade I diastolic dysfunction (impaired relaxation). Right Ventricle: The right ventricular size is normal. No increase in right ventricular wall thickness. Right ventricular systolic function is normal. Left Atrium: Left atrial size was normal in size. Right Atrium: Right atrial size was normal in size. Pericardium: There is no evidence of pericardial effusion. Mitral Valve: The mitral valve is normal in structure. No evidence of mitral valve regurgitation. No evidence of mitral valve stenosis. Tricuspid Valve: The tricuspid valve is normal in structure. Tricuspid valve regurgitation is not demonstrated. No evidence of tricuspid stenosis. Aortic Valve: The aortic valve was not well visualized. Aortic valve regurgitation is not visualized. No aortic stenosis is present. Aortic valve mean gradient measures 4.5 mmHg. Aortic valve peak gradient measures 8.1 mmHg. Aortic valve area, by VTI measures 2.10 cm. Pulmonic Valve: The pulmonic valve was normal in structure. Pulmonic valve regurgitation is not visualized. No evidence of pulmonic stenosis. Aorta: The aortic root is normal in size and structure. Venous: The inferior vena cava is normal in size with greater than 50% respiratory variability, suggesting right atrial pressure of 3 mmHg. IAS/Shunts: No atrial level shunt detected by color flow Doppler.  LEFT VENTRICLE PLAX 2D LVIDd:         3.83 cm  Diastology LVIDs:         2.63 cm  LV e' medial:    5.55 cm/s LV PW:         1.33 cm  LV E/e' medial:  19.8 LV IVS:        1.04 cm  LV e' lateral:   6.85 cm/s LVOT diam:     2.00 cm  LV E/e' lateral: 16.1 LV SV:         61 LV SV Index:   25 LVOT Area:     3.14 cm  RIGHT VENTRICLE RV Basal diam:  3.33 cm RV S prime:     20.70 cm/s TAPSE (M-mode): 5.3 cm LEFT ATRIUM             Index       RIGHT ATRIUM           Index LA diam:        3.90 cm  1.63 cm/m  RA Area:     15.20 cm LA Vol (A2C):  51.9 ml 21.73 ml/m RA Volume:   33.90 ml  14.19 ml/m LA Vol (A4C):   47.9 ml 20.05 ml/m LA Biplane Vol: 49.9 ml 20.89 ml/m  AORTIC VALVE AV Area (Vmax):    2.26 cm AV Area (Vmean):   2.02 cm AV Area (VTI):     2.10 cm AV Vmax:           142.00 cm/s AV Vmean:          98.850 cm/s AV VTI:            0.289 m AV Peak Grad:      8.1 mmHg AV Mean Grad:      4.5 mmHg LVOT Vmax:         102.00 cm/s LVOT Vmean:        63.700 cm/s LVOT VTI:          0.193 m LVOT/AV VTI ratio: 0.67  AORTA Ao Root diam: 3.10 cm MITRAL VALVE                TRICUSPID VALVE MV Area (PHT): 6.96 cm     TR Peak grad:   11.7 mmHg MV Decel Time: 109 msec     TR Vmax:        171.00 cm/s MV E velocity: 110.00 cm/s MV A velocity: 144.00 cm/s  SHUNTS MV E/A ratio:  0.76         Systemic VTI:  0.19 m                             Systemic Diam: 2.00 cm Ida Rogue MD Electronically signed by Ida Rogue MD Signature Date/Time: 08/31/2020/5:29:21 PM    Final      Labs:   Basic Metabolic Panel: Recent Labs  Lab 08/29/20 1314 08/30/20 0531 09/01/20 1026  NA 139 138 139  K 3.8 4.4 3.7  CL 102 101 104  CO2 28 27 26   GLUCOSE 116* 144* 121*  BUN 16 22 30*  CREATININE 1.16* 1.31* 1.36*  CALCIUM 9.3 9.3 9.0   GFR Estimated Creatinine Clearance: 56.8 mL/min (A) (by C-G formula based on SCr of 1.36 mg/dL (H)). Liver Function Tests: No results for input(s): AST, ALT, ALKPHOS, BILITOT, PROT, ALBUMIN in the last 168 hours. No results for input(s): LIPASE, AMYLASE in the last 168 hours. No results for input(s): AMMONIA in the last 168 hours. Coagulation profile No results for input(s): INR, PROTIME in the last 168 hours.  CBC: Recent Labs  Lab 08/29/20 1314 08/30/20 0531  WBC 8.0 8.9  HGB 10.2* 10.7*  HCT 32.8* 33.9*  MCV 78.5* 77.4*  PLT 273 304   Cardiac Enzymes: No results for input(s): CKTOTAL, CKMB, CKMBINDEX, TROPONINI in the last 168 hours. BNP: Invalid  input(s): POCBNP CBG: No results for input(s): GLUCAP in the last 168 hours. D-Dimer No results for input(s): DDIMER in the last 72 hours. Hgb A1c No results for input(s): HGBA1C in the last 72 hours. Lipid Profile No results for input(s): CHOL, HDL, LDLCALC, TRIG, CHOLHDL, LDLDIRECT in the last 72 hours. Thyroid function studies No results for input(s): TSH, T4TOTAL, T3FREE, THYROIDAB in the last 72 hours.  Invalid input(s): FREET3 Anemia work up No results for input(s): VITAMINB12, FOLATE, FERRITIN, TIBC, IRON, RETICCTPCT in the last 72 hours. Microbiology Recent Results (from the past 240 hour(s))  SARS CORONAVIRUS 2 (TAT 6-24 HRS) Nasopharyngeal Nasopharyngeal Swab     Status: None   Collection Time:  08/29/20  1:37 PM   Specimen: Nasopharyngeal Swab  Result Value Ref Range Status   SARS Coronavirus 2 NEGATIVE NEGATIVE Final    Comment: (NOTE) SARS-CoV-2 target nucleic acids are NOT DETECTED.  The SARS-CoV-2 RNA is generally detectable in upper and lower respiratory specimens during the acute phase of infection. Negative results do not preclude SARS-CoV-2 infection, do not rule out co-infections with other pathogens, and should not be used as the sole basis for treatment or other patient management decisions. Negative results must be combined with clinical observations, patient history, and epidemiological information. The expected result is Negative.  Fact Sheet for Patients: SugarRoll.be  Fact Sheet for Healthcare Providers: https://www.woods-mathews.com/  This test is not yet approved or cleared by the [place] FDA and  has been authorized for detection and/or diagnosis of SARS-CoV-2 by FDA under an Emergency Use Authorization (EUA). This EUA will remain  in effect (meaning this test can be used) for the duration of the COVID-19 declaration under Se ction 564(b)(1) of the Act, 21 U.S.C. section 360bbb-3(b)(1), unless the  authorization is terminated or revoked sooner.  Performed at [facility], [place] [address] [place], [place] [address]   Culture, blood (Routine X 2) w Reflex to ID Panel     Status: None (Preliminary result)   Collection Time: 08/29/20  8:07 PM   Specimen: Left Antecubital; Blood  Result Value Ref Range Status   Specimen Description LEFT ANTECUBITAL  Final   Special Requests   Final    BOTTLES DRAWN AEROBIC AND ANAEROBIC Blood Culture adequate volume   Culture   Final    NO GROWTH 4 DAYS Performed at [facility], [address], [place], [place] [address]    Report Status PENDING  Incomplete  Culture, blood (Routine X 2) w Reflex to ID Panel     Status: None (Preliminary result)   Collection Time: 08/29/20  8:11 PM   Specimen: BLOOD LEFT HAND  Result Value Ref Range Status   Specimen Description BLOOD LEFT HAND  Final   Special Requests   Final    BOTTLES DRAWN AEROBIC ONLY Blood Culture adequate volume   Culture   Final    NO GROWTH 4 DAYS Performed at [facility], [address] [place], [place] [address]    Report Status PENDING  Incomplete     Discharge Instructions:   Discharge Instructions    Diet - low sodium heart healthy   Complete by: As directed    Discharge instructions   Complete by: As directed    Follow up with PCP or pulmonologist in 1 week   Increase activity slowly   Complete by: As directed      Allergies as of 09/02/2020      Reactions   Nuvigil [armodafinil] Hives   Penicillins Diarrhea, Nausea And Vomiting   Did it involve swelling of the face/tongue/throat, SOB, or low BP? no Did it involve sudden or severe rash/hives, skin peeling, or any reaction on the inside of your mouth or nose? No Did you need to seek medical attention at a hospital or doctor's office? No When did it last happen?in her 42s If all above answers are "NO", may proceed with cephalosporin use.   Gabapentin Other (See Comments)   "wiped  her out, couldn't stay awake"   Provigil [modafinil] Hives      Medication List    STOP taking these medications   diphenoxylate-atropine 2.5-0.025 MG tablet Commonly known as: Lomotil  TAKE these medications   acetaminophen 500 MG tablet Commonly known as: TYLENOL Take 2 tablets (1,000 mg total) by mouth every 8 (eight) hours as needed for moderate pain.   albuterol 1.25 MG/3ML nebulizer solution Commonly known as: ACCUNEB Take 1 ampule by nebulization every 6 (six) hours as needed for wheezing.   cetirizine 10 MG tablet Commonly known as: ZYRTEC Take 1 tablet (10 mg total) by mouth daily. What changed: when to take this   cholestyramine 4 GM/DOSE powder Commonly known as: QUESTRAN MIX 4 GM AND DRINK BY MOUTH DAILY AT BEDTIME.   lisinopril 10 MG tablet Commonly known as: ZESTRIL TAKE 1 TABLET BY MOUTH DAILY.   multivitamin-iron-minerals-folic acid chewable tablet Chew 1 tablet by mouth daily.   Oscal 500/200 D-3 500-200 MG-UNIT tablet Generic drug: calcium-vitamin D Take 1 tablet by mouth daily with breakfast.   pantoprazole 40 MG tablet Commonly known as: PROTONIX TAKE 1 TABLET BY MOUTH DAILY   promethazine 25 MG tablet Commonly known as: PHENERGAN TAKE 1 TABLET BY MOUTH EVERY 6 HOURS AS NEEDED FOR NAUSEA OR VOMITING.   rOPINIRole 1 MG tablet Commonly known as: REQUIP TAKE 1 TABLET BY MOUTH IN THE MORNING AND AT BEDTIME AND AS NEEDED THROUGHOUT THE DAY IF NEEDED What changed:   how much to take  how to take this  when to take this   Spiriva Respimat 1.25 MCG/ACT Aers Generic drug: Tiotropium Bromide Monohydrate INHALE 2 PUFFS INTO THE LUNGS ONCE DAILY   Symbicort 160-4.5 MCG/ACT inhaler Generic drug: budesonide-formoterol INHALE 2 PUFFS BY MOUTH 2 TIMES DAILY.         Time coordinating discharge: 29 minutes  Signed:  Rudell Marlowe  [facility] 09/02/2020, 9:16 AM   Pager on [email or web address]. If 7PM-7AM, please contact  night-coverage at [email or web address]

## 2020-09-02 NOTE — Plan of Care (Signed)

## 2020-09-02 NOTE — Progress Notes (Signed)
Patient discharging home. Instructions given to patient, verbalized understanding. Patients family will be transporting patient home.

## 2020-09-03 LAB — CULTURE, BLOOD (ROUTINE X 2)
Culture: NO GROWTH
Culture: NO GROWTH
Special Requests: ADEQUATE
Special Requests: ADEQUATE

## 2020-09-04 ENCOUNTER — Telehealth: Payer: Self-pay | Admitting: Internal Medicine

## 2020-09-04 ENCOUNTER — Telehealth: Payer: Self-pay

## 2020-09-04 ENCOUNTER — Other Ambulatory Visit: Payer: Self-pay

## 2020-09-04 ENCOUNTER — Other Ambulatory Visit: Payer: Self-pay | Admitting: *Deleted

## 2020-09-04 MED ORDER — PREDNISONE 20 MG PO TABS
20.0000 mg | ORAL_TABLET | Freq: Every day | ORAL | 0 refills | Status: DC
  Filled 2020-09-04: qty 10, 10d supply, fill #0

## 2020-09-04 MED ORDER — AZITHROMYCIN 250 MG PO TABS
ORAL_TABLET | ORAL | 0 refills | Status: AC
  Filled 2020-09-04: qty 6, 5d supply, fill #0

## 2020-09-04 NOTE — Telephone Encounter (Signed)
Pred 20 mg daily for 10 days Z pak

## 2020-09-04 NOTE — Patient Outreach (Signed)
[facility]) Care Management  09/04/2020  Samantha Mason 21-Apr-1951 [id number]  Transition of care telephone call  Referral received: 09/04/20 Initial outreach: 09/04/20 Insurance: [employer]  Initial unsuccessful telephone call to patient's preferred number in order to complete transition of care assessment; no answer, left HIPAA compliant voicemail message requesting return call.   Objective: Per electronic record Samantha Mason  was hospitalized at [facility] 4/15-4/16/22 for Acute  Asthma exacerbation.  Comorbidities include: Asthma, IBS, Hypertension , GERD She was discharged to home on 09/02/20 without the need for home health services or DME.     Plan: This RNCM will route unsuccessful outreach letter with [place] Management pamphlet and 24 hour Nurse Advice Line Magnet to [place] Management clinical pool to be mailed to patient's home address. This RNCM will attempt another outreach within 4 business days.   Joylene Draft, RN, BSN  [place] Management Coordinator  [phone]- Mobile [phone]- Toll Free Main Office

## 2020-09-04 NOTE — Telephone Encounter (Signed)
Called and spoke to patient.  Patient stated that she was recently discharged on 09/02/2020 for asthma exacerbation.  Since discharge she has been experiencing increased sob, dry cough, wheezing and chest tightness. Patient unable to complete full sentence without stopping for a breathe.  She was not discharged with prednisone or abx.  Using symbicort BID, Spiriva once, combivent Q2H and duoneb 1-2x daily with temporary relief.  Denied F/C/S.  Dr. Mortimer Fries, please advise. Thanks

## 2020-09-04 NOTE — Telephone Encounter (Signed)
Patient is aware of below recommendations. Advised patient to present to ED or UC if sx worsen. She voiced her understanding.   Rx for prednisone and zpak has been sent to preferred pharmacy.  Nothing further needed at this time.

## 2020-09-04 NOTE — Telephone Encounter (Signed)
Transition Care Management Follow-up Telephone Call  Date of discharge and from where: 09/02/20  How have you been since you were released from the hospital? Pt states she is doing okay; still having some shortness of breath and coughing  Any questions or concerns? No  Items Reviewed:  Did the pt receive and understand the discharge instructions provided? Yes   Medications obtained and verified? Yes   Other? No   Any new allergies since your discharge? No   Dietary orders reviewed? Yes  Do you have support at home? Yes   Home Care and Equipment/Supplies: Were home health services ordered? no  Were any new equipment or medical supplies ordered?  No  Functional Questionnaire: (I = Independent and D = Dependent) ADLs: I  Bathing/Dressing- I  Meal Prep- I  Eating- i  Maintaining continence- I  Transferring/Ambulation- I  Managing Meds- I  Follow up appointments reviewed:   PCP Hospital f/u appt confirmed? No  Pt trying to schedule with pulmonology for hosp follow up; pt aware PCP to contact if appt needed.   [facility] f/u appt confirmed? No  pt awaiting appt. Are transportation arrangements needed? No   If their condition worsens, is the pt aware to call PCP or go to the Emergency Dept.? Yes  Was the patient provided with contact information for the PCP's office or ED? Yes  Was to pt encouraged to call back with questions or concerns? Yes

## 2020-09-05 ENCOUNTER — Other Ambulatory Visit: Payer: Self-pay

## 2020-09-05 MED ORDER — CHOLESTYRAMINE 4 G PO PACK
4.0000 g | PACK | Freq: Every day | ORAL | 2 refills | Status: DC
  Filled 2020-09-05: qty 60, 60d supply, fill #0
  Filled 2020-11-20: qty 60, 60d supply, fill #1

## 2020-09-06 ENCOUNTER — Other Ambulatory Visit: Payer: Self-pay

## 2020-09-07 ENCOUNTER — Other Ambulatory Visit: Payer: Self-pay

## 2020-09-07 ENCOUNTER — Other Ambulatory Visit: Payer: Self-pay | Admitting: *Deleted

## 2020-09-07 NOTE — Patient Outreach (Signed)
[facility]) Care Management  09/07/2020  Samantha Mason 1950-05-27 [id number]   Transition of care call Referral received: 09/04/20 Initial outreach attempt: 09/04/20 Insurance: [place] attempt #2 2nd unsuccessful telephone call to patient's preferred contact number in order to complete post hospital discharge transition of care assessment , no answer mailbox is full unable to leave a message.     Objective: Per electronic record Samantha Mason was hospitalized [facility] 4/15-4/16/22 for Acute  Asthma exacerbation.  Comorbidities include: Asthma, IBS, Hypertension , GERD She was discharged to home on 09/02/20 without the need for home health servicesor DME.   Plan If no return call from patient will attempt 3rd outreach in the next 4 business days.    Joylene Draft, RN, BSN  [place] Management Coordinator  [phone]- Mobile [phone]- Toll Free Main Office

## 2020-09-13 ENCOUNTER — Other Ambulatory Visit: Payer: Self-pay | Admitting: *Deleted

## 2020-09-13 ENCOUNTER — Other Ambulatory Visit: Payer: Self-pay

## 2020-09-13 NOTE — Patient Outreach (Signed)
[facility]) Care Management  09/13/2020  Samantha Mason 05-26-1950 [id number]   Transition of care call Referral received: 09/04/20 Initial outreach attempt: 09/04/20 Insurance: [employer]   # 3 Outreach call attempt  Third unsuccessful telephone call to patient's preferred contact number in order to complete post hospital discharge transition of care assessment; no answer, left HIPAA compliant message requesting return call.   Objective: [facility]. Comorbidities include: Asthma, IBS, Hypertension , GERD Shewas discharged to home on 4/16/22without the need for home health servicesor DME.  Plan: If no return call from patient, will plan return call in the next 3 weeks.    Joylene Draft, RN, BSN  [place] Management Coordinator  [phone]- Mobile [phone]- Toll Free Main Office

## 2020-09-19 ENCOUNTER — Other Ambulatory Visit: Payer: Self-pay

## 2020-09-19 ENCOUNTER — Emergency Department: Payer: 59

## 2020-09-19 ENCOUNTER — Observation Stay
Admission: EM | Admit: 2020-09-19 | Discharge: 2020-09-22 | Disposition: A | Discharge status: Home / Self Care | Payer: 59 | Attending: Internal Medicine | Admitting: Internal Medicine

## 2020-09-19 DIAGNOSIS — G2581 Restless legs syndrome: Secondary | ICD-10-CM | POA: Diagnosis not present

## 2020-09-19 DIAGNOSIS — R7303 Prediabetes: Secondary | ICD-10-CM | POA: Diagnosis not present

## 2020-09-19 DIAGNOSIS — Z96653 Presence of artificial knee joint, bilateral: Secondary | ICD-10-CM | POA: Diagnosis not present

## 2020-09-19 DIAGNOSIS — Z79899 Other long term (current) drug therapy: Secondary | ICD-10-CM | POA: Diagnosis not present

## 2020-09-19 DIAGNOSIS — Z20822 Contact with and (suspected) exposure to covid-19: Secondary | ICD-10-CM | POA: Insufficient documentation

## 2020-09-19 DIAGNOSIS — Z9989 Dependence on other enabling machines and devices: Secondary | ICD-10-CM | POA: Diagnosis not present

## 2020-09-19 DIAGNOSIS — J449 Chronic obstructive pulmonary disease, unspecified: Secondary | ICD-10-CM | POA: Insufficient documentation

## 2020-09-19 DIAGNOSIS — J9601 Acute respiratory failure with hypoxia: Principal | ICD-10-CM | POA: Insufficient documentation

## 2020-09-19 DIAGNOSIS — J9602 Acute respiratory failure with hypercapnia: Secondary | ICD-10-CM | POA: Diagnosis present

## 2020-09-19 DIAGNOSIS — J4551 Severe persistent asthma with (acute) exacerbation: Secondary | ICD-10-CM | POA: Diagnosis not present

## 2020-09-19 DIAGNOSIS — G4733 Obstructive sleep apnea (adult) (pediatric): Secondary | ICD-10-CM

## 2020-09-19 DIAGNOSIS — K219 Gastro-esophageal reflux disease without esophagitis: Secondary | ICD-10-CM | POA: Diagnosis not present

## 2020-09-19 DIAGNOSIS — I10 Essential (primary) hypertension: Secondary | ICD-10-CM | POA: Insufficient documentation

## 2020-09-19 DIAGNOSIS — D509 Iron deficiency anemia, unspecified: Secondary | ICD-10-CM | POA: Diagnosis present

## 2020-09-19 DIAGNOSIS — Z85038 Personal history of other malignant neoplasm of large intestine: Secondary | ICD-10-CM | POA: Insufficient documentation

## 2020-09-19 DIAGNOSIS — J45901 Unspecified asthma with (acute) exacerbation: Secondary | ICD-10-CM | POA: Insufficient documentation

## 2020-09-19 DIAGNOSIS — R0602 Shortness of breath: Secondary | ICD-10-CM | POA: Diagnosis not present

## 2020-09-19 DIAGNOSIS — R079 Chest pain, unspecified: Secondary | ICD-10-CM | POA: Diagnosis not present

## 2020-09-19 DIAGNOSIS — J441 Chronic obstructive pulmonary disease with (acute) exacerbation: Secondary | ICD-10-CM

## 2020-09-19 LAB — RESP PANEL BY RT-PCR (FLU A&B, COVID) ARPGX2
Influenza A by PCR: NEGATIVE
Influenza B by PCR: NEGATIVE
SARS Coronavirus 2 by RT PCR: NEGATIVE

## 2020-09-19 LAB — BLOOD GAS, ARTERIAL
Acid-Base Excess: 2.6 mmol/L — ABNORMAL HIGH (ref 0.0–2.0)
Bicarbonate: 27.2 mmol/L (ref 20.0–28.0)
FIO2: 24
O2 Saturation: 96.7 %
Patient temperature: 37
pCO2 arterial: 41 mmHg (ref 32.0–48.0)
pH, Arterial: 7.43 (ref 7.350–7.450)
pO2, Arterial: 85 mmHg (ref 83.0–108.0)

## 2020-09-19 LAB — CBC
HCT: 35.3 % — ABNORMAL LOW (ref 36.0–46.0)
Hemoglobin: 10.9 g/dL — ABNORMAL LOW (ref 12.0–15.0)
MCH: 24.6 pg — ABNORMAL LOW (ref 26.0–34.0)
MCHC: 30.9 g/dL (ref 30.0–36.0)
MCV: 79.7 fL — ABNORMAL LOW (ref 80.0–100.0)
Platelets: 312 10*3/uL (ref 150–400)
RBC: 4.43 MIL/uL (ref 3.87–5.11)
RDW: 16.4 % — ABNORMAL HIGH (ref 11.5–15.5)
WBC: 12.3 10*3/uL — ABNORMAL HIGH (ref 4.0–10.5)
nRBC: 0 % (ref 0.0–0.2)

## 2020-09-19 LAB — BASIC METABOLIC PANEL
Anion gap: 10 (ref 5–15)
BUN: 30 mg/dL — ABNORMAL HIGH (ref 8–23)
CO2: 26 mmol/L (ref 22–32)
Calcium: 9.6 mg/dL (ref 8.9–10.3)
Chloride: 104 mmol/L (ref 98–111)
Creatinine, Ser: 1.62 mg/dL — ABNORMAL HIGH (ref 0.44–1.00)
GFR, Estimated: 34 mL/min — ABNORMAL LOW (ref >60)
Glucose, Bld: 133 mg/dL — ABNORMAL HIGH (ref 70–99)
Potassium: 4 mmol/L (ref 3.5–5.1)
Sodium: 140 mmol/L (ref 135–145)

## 2020-09-19 LAB — TROPONIN I (HIGH SENSITIVITY)
Troponin I (High Sensitivity): 9 ng/L (ref <18)
Troponin I (High Sensitivity): 9 ng/L (ref <18)

## 2020-09-19 LAB — BRAIN NATRIURETIC PEPTIDE: B Natriuretic Peptide: 20.3 pg/mL (ref 0.0–100.0)

## 2020-09-19 LAB — D-DIMER, QUANTITATIVE: D-Dimer, Quant: 0.61 ug/mL-FEU — ABNORMAL HIGH (ref 0.00–0.50)

## 2020-09-19 MED ORDER — MAGNESIUM SULFATE 2 GM/50ML IV SOLN
2.0000 g | Freq: Once | INTRAVENOUS | Status: AC
  Administered 2020-09-19: 2 g via INTRAVENOUS
  Filled 2020-09-19: qty 50

## 2020-09-19 MED ORDER — ACETAMINOPHEN 650 MG RE SUPP: 650.0000 mg | Freq: Four times a day (QID) | RECTAL | Status: DC | PRN

## 2020-09-19 MED ORDER — ENOXAPARIN SODIUM 80 MG/0.8ML IJ SOSY
0.5000 mg/kg | PREFILLED_SYRINGE | INTRAMUSCULAR | Status: DC
  Administered 2020-09-20 – 2020-09-21 (×3): 72.5 mg via SUBCUTANEOUS
  Filled 2020-09-19: qty 0.8
  Filled 2020-09-19: qty 0.72
  Filled 2020-09-19 (×2): qty 0.8

## 2020-09-19 MED ORDER — IPRATROPIUM-ALBUTEROL 0.5-2.5 (3) MG/3ML IN SOLN
3.0000 mL | Freq: Four times a day (QID) | RESPIRATORY_TRACT | Status: DC
  Administered 2020-09-19 – 2020-09-21 (×7): 3 mL via RESPIRATORY_TRACT
  Filled 2020-09-19 (×7): qty 3

## 2020-09-19 MED ORDER — ROPINIROLE HCL 1 MG PO TABS
1.0000 mg | ORAL_TABLET | Freq: Every day | ORAL | Status: DC
  Administered 2020-09-20 – 2020-09-21 (×2): 1 mg via ORAL
  Filled 2020-09-19 (×3): qty 1

## 2020-09-19 MED ORDER — PREDNISONE 20 MG PO TABS: 40.0000 mg | ORAL_TABLET | Freq: Every day | ORAL | Status: DC

## 2020-09-19 MED ORDER — PANTOPRAZOLE SODIUM 40 MG PO TBEC
40.0000 mg | DELAYED_RELEASE_TABLET | Freq: Every day | ORAL | Status: DC
  Administered 2020-09-20 – 2020-09-22 (×3): 40 mg via ORAL
  Filled 2020-09-19 (×3): qty 1

## 2020-09-19 MED ORDER — METHYLPREDNISOLONE SODIUM SUCC 125 MG IJ SOLR
125.0000 mg | Freq: Once | INTRAMUSCULAR | Status: AC
  Administered 2020-09-19: 125 mg via INTRAVENOUS
  Filled 2020-09-19: qty 2

## 2020-09-19 MED ORDER — ALBUTEROL SULFATE (2.5 MG/3ML) 0.083% IN NEBU: 2.5000 mg | INHALATION_SOLUTION | RESPIRATORY_TRACT | Status: DC | PRN

## 2020-09-19 MED ORDER — METHYLPREDNISOLONE SODIUM SUCC 125 MG IJ SOLR
60.0000 mg | Freq: Four times a day (QID) | INTRAMUSCULAR | Status: AC
  Administered 2020-09-20 – 2020-09-21 (×4): 60 mg via INTRAVENOUS
  Filled 2020-09-19 (×4): qty 2

## 2020-09-19 MED ORDER — DOXYCYCLINE HYCLATE 100 MG PO TABS
100.0000 mg | ORAL_TABLET | Freq: Two times a day (BID) | ORAL | Status: DC
  Administered 2020-09-19 – 2020-09-22 (×6): 100 mg via ORAL
  Filled 2020-09-19 (×6): qty 1

## 2020-09-19 MED ORDER — SODIUM CHLORIDE 0.9 % IV SOLN
75.0000 mL/h | INTRAVENOUS | Status: AC
  Administered 2020-09-19 – 2020-09-20 (×2): 75 mL/h via INTRAVENOUS

## 2020-09-19 MED ORDER — LORATADINE 10 MG PO TABS
10.0000 mg | ORAL_TABLET | Freq: Every day | ORAL | Status: DC
  Administered 2020-09-20 – 2020-09-22 (×3): 10 mg via ORAL
  Filled 2020-09-19 (×3): qty 1

## 2020-09-19 MED ORDER — ACETAMINOPHEN 325 MG PO TABS
650.0000 mg | ORAL_TABLET | Freq: Four times a day (QID) | ORAL | Status: DC | PRN
  Administered 2020-09-19 – 2020-09-20 (×3): 650 mg via ORAL
  Filled 2020-09-19 (×3): qty 2

## 2020-09-19 MED ORDER — IPRATROPIUM-ALBUTEROL 0.5-2.5 (3) MG/3ML IN SOLN
3.0000 mL | RESPIRATORY_TRACT | Status: DC | PRN
  Administered 2020-09-19: 3 mL via RESPIRATORY_TRACT
  Filled 2020-09-19: qty 3

## 2020-09-19 MED ORDER — IPRATROPIUM-ALBUTEROL 0.5-2.5 (3) MG/3ML IN SOLN
3.0000 mL | Freq: Once | RESPIRATORY_TRACT | Status: AC
  Administered 2020-09-19: 3 mL via RESPIRATORY_TRACT
  Filled 2020-09-19: qty 3

## 2020-09-19 MED ORDER — HYDROCODONE-ACETAMINOPHEN 5-325 MG PO TABS
1.0000 | ORAL_TABLET | ORAL | Status: DC | PRN
  Administered 2020-09-20: 1 via ORAL
  Administered 2020-09-21 – 2020-09-22 (×2): 2 via ORAL
  Filled 2020-09-19: qty 2
  Filled 2020-09-19: qty 1
  Filled 2020-09-19: qty 2

## 2020-09-19 MED ORDER — MOMETASONE FURO-FORMOTEROL FUM 200-5 MCG/ACT IN AERO
2.0000 | INHALATION_SPRAY | Freq: Two times a day (BID) | RESPIRATORY_TRACT | Status: DC
  Filled 2020-09-19: qty 8.8

## 2020-09-19 NOTE — ED Triage Notes (Addendum)
Pt states that she was seen 3 weeks ago with asthma issues and diagnosed with chf at that time, pt reports that today while at work her chest became tight and states that she is wheezing and is having left elbow pain and in between her shoulder blades, pt is breathing hard in triage. 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

## 2020-09-19 NOTE — ED Provider Notes (Signed)
[facility] Emergency Department Provider Note   ____________________________________________   Event Date/Time   First MD Initiated Contact with Patient 09/19/20 1732     (approximate)  I have reviewed the triage vital signs and the nursing notes.   HISTORY  Chief Complaint Chest Pain and Wheezing    HPI Samantha Mason is a 70 y.o. female with below stated past medical history and significant for asthma who presents for approximately 4 hours of worsening shortness of breath, chest tightness, wheezing, and left elbow pain.  Patient states that she is taken all of her medications on time and as prescribed today including Symbicort and tried a nebulizer prior to work given shortness of breath.  Patient states that symptoms have been worsening and have no relieving factors.  Does endorse dyspnea on exertion.  Patient currently denies any vision changes, tinnitus, difficulty speaking, facial droop, sore throat, abdominal pain, nausea/vomiting/diarrhea, dysuria, or weakness/numbness/paresthesias in any extremity         Past Medical History:  Diagnosis Date  . Acute bronchitis   . Anemia   . Anxiety and depression   . Arthritis   . Asthma   . B12 deficiency 03/14/2017  . Chest pain 07/02/2018  . COPD (chronic obstructive pulmonary disease) ([place]) 07/24/2018  . Family history of blood clots 07/16/2017  . Irritable bowel syndrome   . Malignant hypertension   . Obesity   . OSA (obstructive sleep apnea)    uses cpap  . Pneumonia 2017  . Restless leg syndrome   . Stroke [facility]) 1995   mild    Patient Active Problem List   Diagnosis Date Noted  . Acute respiratory failure with hypoxia ([place]) 09/20/2020  . Asthma exacerbation 09/19/2020  . Acute asthma exacerbation 09/01/2020  . Asthma in adult, moderate persistent, with acute exacerbation 08/29/2020  . Ovarian failure 02/21/2020  . Total knee replacement status 04/30/2019  . Primary osteoarthritis of  right knee 02/13/2019  . S/P total knee arthroplasty, left 02/13/2019  . Aortic atherosclerosis ([place]) 08/01/2018  . B12 deficiency anemia 07/07/2018  . Benign neoplasm of ascending colon   . Positive colorectal cancer screening using Cologuard test 03/13/2018  . Severe persistent asthma 02/13/2018  . Vitamin D deficiency 03/17/2017  . Prediabetes 03/14/2017  . Microcytic anemia 03/14/2017  . IBS (irritable bowel syndrome) 03/12/2017  . Allergic rhinitis 03/12/2017  . Restless leg syndrome 03/12/2017  . Acid reflux 10/16/2016  . Essential hypertension 10/16/2016  . OSA on CPAP 12/08/2014  . BMI 50.0-59.9, adult ([place]) 05/31/2014    Past Surgical History:  Procedure Laterality Date  . CHOLECYSTECTOMY    . COLONOSCOPY WITH PROPOFOL N/A 03/31/2018   Procedure: COLONOSCOPY WITH PROPOFOL;  Surgeon: Lucilla Lame, MD;  Location: [facility] END[facility]COPY;  Service: Endoscopy;  Laterality: N/A;  . KNEE ARTHROPLASTY Right 04/30/2019   Procedure: COMPUTER ASSISTED TOTAL KNEE ARTHROPLASTY;  Surgeon: Dereck Leep, MD;  Location: [facility] ORS;  Service: Orthopedics;  Laterality: Right;  . saliva gland removal  1998  . TOTAL KNEE ARTHROPLASTY Left 2014  . TUBAL LIGATION Bilateral     Prior to Admission medications   Medication Sig Start Date End Date Taking? Authorizing Provider  acetaminophen (TYLENOL) 500 MG tablet Take 2 tablets (1,000 mg total) by mouth every 8 (eight) hours as needed for moderate pain. 04/09/17  Yes Plonk, [name], MD  calcium-vitamin D (OSCAL 500/200 D-3) 500-200 MG-UNIT tablet Take 1 tablet by mouth daily with breakfast.   Yes [provider]  cetirizine (ZYRTEC) 10 MG tablet Take 1 tablet (10 mg total) by mouth daily. Patient taking differently: Take 10 mg by mouth every morning. 12/07/14  Yes Ashok Norris, MD  cholestyramine (QUESTRAN) 4 g packet Take 1 packet (4 g total) and drink by mouth at bedtime. 07/12/20  Yes Glean Hess, MD  fluticasone [facility]) 50  MCG/ACT nasal spray Place 1 spray into both nostrils daily. 09/22/20  Yes Rai, Ripudeep K, MD  lisinopril (ZESTRIL) 10 MG tablet TAKE 1 TABLET BY MOUTH DAILY. 07/12/20 07/12/21 Yes Glean Hess, MD  multivitamin-iron-minerals-folic acid (CENTRUM) chewable tablet Chew 1 tablet by mouth daily.   Yes [provider]  pantoprazole (PROTONIX) 40 MG tablet TAKE 1 TABLET BY MOUTH DAILY 06/09/20 06/09/21 Yes Glean Hess, MD  predniSONE (DELTASONE) 10 MG tablet Prednisone dosing: Take  Prednisone 40mg  (4 tabs) x 3 days, then taper to 30mg  (3 tabs) x 3 days, then 20mg  (2 tabs) x 3days, then 10mg  (1 tab) x 3days, then OFF. 09/22/20  Yes Rai, Ripudeep K, MD  promethazine (PHENERGAN) 25 MG tablet TAKE 1 TABLET BY MOUTH EVERY 6 HOURS AS NEEDED FOR NAUSEA OR VOMITING. 08/16/20 08/16/21 Yes Glean Hess, MD  Tiotropium Bromide Monohydrate 1.25 MCG/ACT AERS INHALE 2 PUFFS INTO THE LUNGS ONCE DAILY 03/24/20 03/24/21 Yes Flora Lipps, MD  albuterol (ACCUNEB) 1.25 MG/3ML nebulizer solution Take 3 mLs (1.25 mg total) by nebulization every 6 (six) hours as needed for wheezing. 09/22/20   Rai, Ripudeep Raliegh Ip, MD  benzonatate (TESSALON) 100 MG capsule Take 1 capsule (100 mg total) by mouth 3 (three) times daily as needed for cough. 09/22/20   Rai, Vernelle Emerald, MD  budesonide-formoterol (SYMBICORT) 160-4.5 MCG/ACT inhaler Inhale 2 puffs into the lungs in the morning and at bedtime. 09/22/20 04/14/21  Rai, Vernelle Emerald, MD  cholestyramine Lucrezia Starch) 4 GM/DOSE powder MIX 4 GM AND DRINK BY MOUTH DAILY AT BEDTIME. 07/12/20   Glean Hess, MD  doxycycline (VIBRA-TABS) 100 MG tablet Take 1 tablet (100 mg total) by mouth 2 (two) times daily for 4 days. 09/22/20 09/26/20  Rai, Ripudeep K, MD  rOPINIRole (REQUIP) 1 MG tablet Take 1 tablet (1 mg total) by mouth at bedtime. 09/22/20 01/20/21  Rai, Vernelle Emerald, MD    Allergies Nuvigil [armodafinil], Penicillins, Gabapentin, and Provigil [modafinil]  Family History  Problem Relation Age  of Onset  . Dementia Mother   . COPD Father        44  . Diabetes Father   . Heart failure Brother        2010  . Breast cancer Neg Hx     Social History Social History   Tobacco Use  . Smoking status: Never Smoker  . Smokeless tobacco: Never Used  Vaping Use  . Vaping Use: Never used  Substance Use Topics  . Alcohol use: No    Alcohol/week: 0.0 standard drinks  . Drug use: No    Review of Systems Constitutional: No fever/chills Eyes: No visual changes. ENT: No sore throat. Cardiovascular: Endorses chest pain. Respiratory: Endorses shortness of breath. Gastrointestinal: No abdominal pain.  No nausea, no vomiting.  No diarrhea. Genitourinary: Negative for dysuria. Musculoskeletal: Endorses left elbow pain Skin: Negative for rash. Neurological: Negative for headaches, weakness/numbness/paresthesias in any extremity Psychiatric: Negative for suicidal ideation/homicidal ideation   ____________________________________________   PHYSICAL EXAM:  VITAL SIGNS: ED Triage Vitals  Enc Vitals Group     BP 09/19/20 1644 (!) 150/76     Pulse Rate 09/19/20  1644 (!) 109     Resp 09/19/20 1644 (!) 24     Temp 09/19/20 1644 98.8 F (37.1 C)     Temp Source 09/19/20 1644 Oral     SpO2 09/19/20 1642 97 %     Weight 09/19/20 1646 (!) 322 lb (146.1 kg)     Height 09/19/20 1646 5\' 4"  (1.626 m)     Head Circumference --      Peak Flow --      Pain Score 09/19/20 1645 9     Pain Loc --      Pain Edu? --      Excl. in [place]? --    Constitutional: Alert and oriented. Well appearing and in no acute distress. Eyes: Conjunctivae are normal. PERRL. Head: Atraumatic. Nose: No congestion/rhinnorhea. Mouth/Throat: Mucous membranes are moist. Neck: No stridor Cardiovascular: Grossly normal heart sounds.  Good peripheral circulation. Respiratory: Mildly increased respiratory effort, expiratory wheezes over bilateral lung fields.  No retractions Gastrointestinal: Soft and nontender. No  distention. Musculoskeletal: No obvious deformities Neurologic:  Normal speech and language. No gross focal neurologic deficits are appreciated. Skin:  Skin is warm and dry. No rash noted. Psychiatric: Mood and affect are normal. Speech and behavior are normal.  ____________________________________________   LABS (all labs ordered are listed, but only abnormal results are displayed)  Labs Reviewed  BASIC METABOLIC PANEL - Abnormal; Notable for the following components:      Result Value   Glucose, Bld 133 (*)    BUN 30 (*)    Creatinine, Ser 1.62 (*)    GFR, Estimated 34 (*)    All other components within normal limits  CBC - Abnormal; Notable for the following components:   WBC 12.3 (*)    Hemoglobin 10.9 (*)    HCT 35.3 (*)    MCV 79.7 (*)    MCH 24.6 (*)    RDW 16.4 (*)    All other components within normal limits  D-DIMER, QUANTITATIVE - Abnormal; Notable for the following components:   D-Dimer, Quant 0.61 (*)    All other components within normal limits  BLOOD GAS, ARTERIAL - Abnormal; Notable for the following components:   Acid-Base Excess 2.6 (*)    All other components within normal limits  IRON AND TIBC - Abnormal; Notable for the following components:   Iron 19 (*)    Saturation Ratios 5 (*)    All other components within normal limits  CBC WITH DIFFERENTIAL/PLATELET - Abnormal; Notable for the following components:   WBC 11.3 (*)    Hemoglobin 11.0 (*)    MCH 24.5 (*)    RDW 16.5 (*)    Neutro Abs 10.5 (*)    Lymphs Abs 0.6 (*)    All other components within normal limits  COMPREHENSIVE METABOLIC PANEL - Abnormal; Notable for the following components:   Glucose, Bld 192 (*)    BUN 27 (*)    Creatinine, Ser 1.38 (*)    Albumin 3.4 (*)    GFR, Estimated 42 (*)    All other components within normal limits  BRAIN NATRIURETIC PEPTIDE - Abnormal; Notable for the following components:   B Natriuretic Peptide 131.4 (*)    All other components within normal  limits  RESP PANEL BY RT-PCR (FLU A&B, COVID) ARPGX2  MRSA PCR SCREENING  BRAIN NATRIURETIC PEPTIDE  VITAMIN B12  FOLATE  FERRITIN  RETICULOCYTES  MAGNESIUM  PHOSPHORUS  TSH  TROPONIN I (HIGH SENSITIVITY)  TROPONIN I (HIGH SENSITIVITY)  TROPONIN I (HIGH SENSITIVITY)   ____________________________________________  EKG  ED ECG REPORT I, Naaman Plummer, the attending physician, personally viewed and interpreted this ECG.  Date: 09/19/2020 EKG Time: 1646 Rate: 111 Rhythm: Tachycardic sinus rhythm QRS Axis: normal Intervals: normal ST/T Wave abnormalities: normal Narrative Interpretation: no evidence of acute ischemia  ____________________________________________  RADIOLOGY  ED MD interpretation: 2 view chest x-ray shows no evidence of acute abnormalities including no pneumonia, pneumothorax, or widened mediastinum  Official radiology report(s): No results found.  ____________________________________________   PROCEDURES  Procedure(s) performed (including Critical Care):  .1-3 Lead EKG Interpretation Performed by: Naaman Plummer, MD Authorized by: Naaman Plummer, MD     Interpretation: abnormal     ECG rate:  101   ECG rate assessment: tachycardic     Rhythm: sinus tachycardia     Ectopy: none     Conduction: normal   .Critical Care Performed by: Naaman Plummer, MD Authorized by: Naaman Plummer, MD   Critical care provider statement:    Critical care time (minutes):  31   Critical care time was exclusive of:  Separately billable procedures and treating other patients   Critical care was necessary to treat or prevent imminent or life-threatening deterioration of the following conditions:  Respiratory failure   Critical care was time spent personally by me on the following activities:  Discussions with consultants, evaluation of patient's response to treatment, examination of patient, ordering and performing treatments and interventions, ordering and  review of laboratory studies, ordering and review of radiographic studies, pulse oximetry, re-evaluation of patient's condition, obtaining history from patient or surrogate and review of old charts   I assumed direction of critical care for this patient from another provider in my specialty: no     Care discussed with: admitting provider       ____________________________________________   INITIAL IMPRESSION / [place] / ED COURSE  As part of my medical decision making, I reviewed the following data within the [place] notes reviewed and incorporated, Labs reviewed, EKG interpreted, Old chart reviewed, Radiograph reviewed and Notes from prior ED visits reviewed and incorporated        Presentation most consistent with acute asthma exacerbation.  Presentation less concerning for pneumonia, heart failure, foreign body airway obstruction, pulmonary embolism, tamponade, atypical ACS  Reassuring factors: No AMS, silent respirations, belly-breathing, or other sign of impending ventilatory failure. Never intubated or admitted to the hospital for asthma exacerbation.  Workup Defer labs and imaging given clinically in exacerbation of known asthma with similar exacerbation presentations per patient.  Reassessment: Patient STILL without AMS, silent respirations, belly-breathing, or other sign of impending ventilatory failure. HOWEVER, they are only with minimal improvement of symptoms with steroids and breathing treatments over several hours. Disposition: OBS admission for continued treatment and airway monitoring.      ____________________________________________   FINAL CLINICAL IMPRESSION(S) / ED DIAGNOSES  Final diagnoses:  Severe persistent asthma with exacerbation     ED Discharge Orders         Ordered    predniSONE (DELTASONE) 10 MG tablet        09/22/20 1157    doxycycline (VIBRA-TABS) 100 MG tablet  2 times daily        09/22/20  1157    rOPINIRole (REQUIP) 1 MG tablet  Daily at bedtime        09/22/20 1157    benzonatate (TESSALON) 100 MG capsule  3 times daily PRN  09/22/20 1157    budesonide-formoterol (SYMBICORT) 160-4.5 MCG/ACT inhaler  2 times daily        09/22/20 1157    albuterol (ACCUNEB) 1.25 MG/3ML nebulizer solution  Every 6 hours PRN        09/22/20 1157    Increase activity slowly        09/22/20 1157    Diet - low sodium heart healthy        09/22/20 1157    Ambulatory referral to Cardiology       Comments: History of diastolic CHF, patient requesting to follow up with cardiology.   09/22/20 1157    fluticasone (FLONASE) 50 MCG/ACT nasal spray  Daily        09/22/20 1200           Note:  This document was prepared using Dragon voice recognition software and may include unintentional dictation errors.   Naaman Plummer, MD 09/24/20 [phone]

## 2020-09-19 NOTE — ED Notes (Signed)
RT called for ABG.

## 2020-09-19 NOTE — ED Notes (Signed)
Ambulated in room on room air. Patient tachypneic while ambulating. RR 28-30. SPO2 91%. Dr. Cheri Fowler notified.

## 2020-09-19 NOTE — H&P (Addendum)
Samantha Mason UXN:[id number] DOB: 10-23-1950 DOA: 09/19/2020    PCP: Glean Hess, MD   Outpatient Specialists:   CARDS: needs to find a heart Dr.   Pulmonary  Dr. Mortimer Fries   Patient arrived to ER on 09/19/20 at 1637 Referred by Attending Naaman Plummer, MD   Patient coming from: home Lives   With family     Chief Complaint:   Chief Complaint  Patient presents with  . Chest Pain  . Wheezing    HPI: Samantha Mason is a 70 y.o. female with medical history significant of Asthma, OSA on CPAP, HTN,   morbid obesity, anxiety, depression, GERD, vitamin B12 deficiency, hypertension, CKD    Presented with  Worsening SOB noted to tachypnea She was at work at that time reports that her chest became tight and she started to feel she is wheezing.  This was similar to her prior asthma attacks. This was associated with pain between her shoulder blades and left elbow. Hypoxic down to 89% on RA and started on O2  Recent diagnosis with CHF She works as a Occupational psychologist for [employer] Last admit was mid April 2022 with asthma exacerbation  States she has been home only 3 days and started to feel sick again She called pulmonology and was given z-pack and prednisone She finished last prednisone on 5 days ago No sick contacts Infectious risk factors:  Reports  shortness of breath,      Has been vaccinated against COVID    Initial COVID TEST  NEGATIVE   Lab Results  Component Value Date   [place] 09/19/2020   [place] NEGATIVE 08/29/2020   [place] NEGATIVE 03/09/2020   [place] NEGATIVE 04/23/2019    Regarding pertinent Chronic problems:      HTN on lisinopril   chronic CHF diastolic/systolic/ combined - last echo April 2022 showing grade 1 diastolic dysfunction preserved EF      Morbid obesity-   BMI Readings from Last 1 Encounters:  09/19/20 55.27 kg/m      Asthma/COPD     - not  followed by pulmonology   not  on baseline oxygen      Takes Spiriva  Symbicort and albuterol as needed   OSA -on nocturnal  CPAP,     CKD stage IIIb- baseline Cr 1.3 Estimated Creatinine Clearance: 47.9 mL/min (A) (by C-G formula based on SCr of 1.62 mg/dL (H)).  Lab Results  Component Value Date   CREATININE 1.62 (H) 09/19/2020   CREATININE 1.36 (H) 09/01/2020   CREATININE 1.31 (H) 08/30/2020     Chronic anemia - baseline hg Hemoglobin & Hematocrit  Recent Labs    08/29/20 1314 08/30/20 0531 09/19/20 1650  HGB 10.2* 10.7* 10.9*    While in ER: Was given a dose of Solu-Medrol magnesium and nebulizer treatments but still continues to have significant wheezing and now requiring oxygen D-dimer mildly elevated but no current chest pain Patient noted to have mild AKI not a candidate for CTA at this time Troponin within normal limits no evidence of ischemic changes on EKG Admitted for asthma/COPD exacerbation   ED Triage Vitals  Enc Vitals Group     BP 09/19/20 1644 (!) 150/76     Pulse Rate 09/19/20 1644 (!) 109     Resp 09/19/20 1644 (!) 24     Temp 09/19/20 1644 98.8 F (37.1 C)     Temp Source 09/19/20 1644 Oral     SpO2 09/19/20 1642 97 %  Weight 09/19/20 1646 (!) 322 lb (146.1 kg)     Height 09/19/20 1646 5\' 4"  (1.626 m)     Head Circumference --      Peak Flow --      Pain Score 09/19/20 1645 9     Pain Loc --      Pain Edu? --      Excl. in [place]? --   TMAX(24)@     _________________________________________ Significant initial  Findings: Abnormal Labs Reviewed  BASIC METABOLIC PANEL - Abnormal; Notable for the following components:      Result Value   Glucose, Bld 133 (*)    BUN 30 (*)    Creatinine, Ser 1.62 (*)    GFR, Estimated 34 (*)    All other components within normal limits  CBC - Abnormal; Notable for the following components:   WBC 12.3 (*)    Hemoglobin 10.9 (*)    HCT 35.3 (*)    MCV 79.7 (*)    MCH 24.6 (*)    RDW 16.4 (*)    All other components within normal limits  D-DIMER, QUANTITATIVE - Abnormal;  Notable for the following components:   D-Dimer, Quant 0.61 (*)    All other components within normal limits   ____________________________________________   CXR -  NON acute  _________________________ Troponin 9  ECG: Ordered Personally reviewed by me showing: HR : 111 Rhythm:   Sinus tachycardia     no evidence of ischemic changes QTC 443 ____________________ This patient meets SIRS Criteria    The recent clinical data is shown below. Vitals:   09/19/20 1830 09/19/20 1900 09/19/20 1930 09/19/20 1951  BP: (!) 152/48 (!) 139/51    Pulse: (!) 102 (!) 104 (!) 102   Resp: 19 (!) 22 (!) 25 (!) 28  Temp:      TempSrc:      SpO2: 92% 93% 91% 91%  Weight:      Height:        WBC     Component Value Date/Time   WBC 12.3 (H) 09/19/2020 1650   LYMPHSABS 1.8 02/10/2020 1558   LYMPHSABS 1.6 04/20/2014 1610   MONOABS 0.6 02/10/2020 1558   MONOABS 0.4 04/20/2014 1610   EOSABS 0.3 02/10/2020 1558   EOSABS 0.2 04/20/2014 1610   BASOSABS 0.1 02/10/2020 1558   BASOSABS 0.0 04/20/2014 1610      UA     ordered     Results for orders placed or performed during the hospital encounter of 09/19/20  Resp Panel by RT-PCR (Flu A&B, Covid) Nasopharyngeal Swab     Status: None   Collection Time: 09/19/20  5:43 PM   Specimen: Nasopharyngeal Swab; Nasopharyngeal(NP) swabs in vial transport medium  Result Value Ref Range Status   SARS Coronavirus 2 by RT PCR NEGATIVE NEGATIVE Final         Influenza A by PCR NEGATIVE NEGATIVE Final   Influenza B by PCR NEGATIVE NEGATIVE Final          _______________________________________________ Hospitalist was called for admission for asthma/COPD exacerbation  The following Work up has been ordered so far:  Orders Placed This Encounter  Procedures  . Resp Panel by RT-PCR (Flu A&B, Covid) Nasopharyngeal Swab  . DG Chest 2 View  . Basic metabolic panel  . CBC  . D-dimer, quantitative  . Brain natriuretic peptide  . Document Height and Actual  Weight  . Consult to hospitalist  . Airborne and Contact precautions  . EKG 12-Lead  .  ED EKG    Following Medications were ordered in ER: Medications  ipratropium-albuterol (DUONEB) 0.5-2.5 (3) MG/3ML nebulizer solution 3 mL (has no administration in time range)  ipratropium-albuterol (DUONEB) 0.5-2.5 (3) MG/3ML nebulizer solution 3 mL (3 mLs Nebulization Given 09/19/20 1742)  methylPREDNISolone sodium succinate (SOLU-MEDROL) 125 mg/2 mL injection 125 mg (125 mg Intravenous Given 09/19/20 1742)        Consult Orders  (From admission, onward)         Start     Ordered   09/19/20 2020  Consult to hospitalist  Once       Provider:  (Not yet assigned)  Question Answer Comment  Place call to: [phone]   Reason for Consult Admit   Diagnosis/Clinical Info for Consult: asthma exacerbation      09/19/20 2019            OTHER Significant initial  Findings:  labs showing:    Recent Labs  Lab 09/19/20 1650  NA 140  K 4.0  CO2 26  GLUCOSE 133*  BUN 30*  CREATININE 1.62*  CALCIUM 9.6    Cr ,  Up from baseline see below Lab Results  Component Value Date   CREATININE 1.62 (H) 09/19/2020   CREATININE 1.36 (H) 09/01/2020   CREATININE 1.31 (H) 08/30/2020    No results for input(s): AST, ALT, ALKPHOS, BILITOT, PROT, ALBUMIN in the last 168 hours. Lab Results  Component Value Date   CALCIUM 9.6 09/19/2020      Plt: Lab Results  Component Value Date   PLT 312 09/19/2020     COVID-19 Labs  Recent Labs    09/19/20 1923  DDIMER 0.61*    Lab Results  Component Value Date   SARSCOV2NAA NEGATIVE 09/19/2020   SARSCOV2NAA NEGATIVE 08/29/2020   SARSCOV2NAA NEGATIVE 03/09/2020   SARSCOV2NAA NEGATIVE 04/23/2019      ABG    Component Value Date/Time   PHART 7.43 09/19/2020 2107   PCO2ART 41 09/19/2020 2107   PO2ART 85 09/19/2020 2107   HCO3 27.2 09/19/2020 2107   O2SAT 96.7 09/19/2020 2107        Recent Labs  Lab 09/19/20 1650  WBC 12.3*  HGB 10.9*   HCT 35.3*  MCV 79.7*  PLT 312    HG/HCT  Stable,     Component Value Date/Time   HGB 10.9 (L) 09/19/2020 1650   HGB 10.2 (L) 04/20/2014 1610   HCT 35.3 (L) 09/19/2020 1650   HCT 33.3 (L) 04/20/2014 1610   MCV 79.7 (L) 09/19/2020 1650   MCV 76 (L) 04/20/2014 1610       Cardiac Panel (last 3 results) No results for input(s): CKTOTAL, CKMB, TROPONINI, RELINDX in the last 72 hours.   BNP (last 3 results) Recent Labs    02/10/20 1558 08/29/20 1314 09/19/20 1650  BNP 29.5 44.6 20.3    DM  labs:  HbA1C: Recent Labs    02/16/20 1154  HGBA1C 5.7*       CBG (last 3)  No results for input(s): GLUCAP in the last 72 hours.       Cultures:    Component Value Date/Time   SDES BLOOD LEFT HAND 08/29/2020 2011   SPECREQUEST  08/29/2020 2011    BOTTLES DRAWN AEROBIC ONLY Blood Culture adequate volume   CULT  08/29/2020 2011    NO GROWTH 5 DAYS Performed at [facility], [address] [place], [place] [address]    REPTSTATUS 09/03/2020 FINAL 08/29/2020 2011  Radiological Exams on Admission: DG Chest 2 View  Result Date: 09/19/2020 CLINICAL DATA:  Shortness of breath EXAM: CHEST - 2 VIEW COMPARISON:  August 29, 2020 chest radiograph and chest CT FINDINGS: Lungs are clear. Heart size and pulmonary vascularity are normal. No adenopathy. No pneumothorax. No bone lesions. IMPRESSION: Lungs clear.  Cardiac silhouette normal. Electronically Signed   By: Lowella Grip III M.D.   On: 09/19/2020 17:19   _______________________________________________________________________________________________________ Latest  Blood pressure (!) 139/51, pulse (!) 102, temperature 98.8 F (37.1 C), temperature source Oral, resp. rate (!) 28, height 5\' 4"  (1.626 m), weight (!) 146.1 kg, SpO2 91 %.   Review of Systems:    Pertinent positives include:   fatigue,   shortness of breath at rest.   dyspnea on exertion Constitutional:  No weight loss, night sweats, Fevers, chills  weight loss  HEENT:  No headaches, Difficulty swallowing,Tooth/dental problems,Sore throat,  No sneezing, itching, ear ache, nasal congestion, post nasal drip,  Cardio-vascular:  No chest pain, Orthopnea, PND, anasarca, dizziness, palpitations.no Bilateral lower extremity swelling  GI:  No heartburn, indigestion, abdominal pain, nausea, vomiting, diarrhea, change in bowel habits, loss of appetite, melena, blood in stool, hematemesis Resp:  no, No excess mucus, no productive cough, No non-productive cough, No coughing up of blood.No change in color of mucus.No wheezing. Skin:  no rash or lesions. No jaundice GU:  no dysuria, change in color of urine, no urgency or frequency. No straining to urinate.  No flank pain.  Musculoskeletal:  No joint pain or no joint swelling. No decreased range of motion. No back pain.  Psych:  No change in mood or affect. No depression or anxiety. No memory loss.  Neuro: no localizing neurological complaints, no tingling, no weakness, no double vision, no gait abnormality, no slurred speech, no confusion  All systems reviewed and apart from [place] all are negative _______________________________________________________________________________________________ Past Medical History:   Past Medical History:  Diagnosis Date  . Acute bronchitis   . Anemia   . Anxiety and depression   . Arthritis   . Asthma   . B12 deficiency 03/14/2017  . Chest pain 07/02/2018  . COPD (chronic obstructive pulmonary disease) ([place]) 07/24/2018  . Family history of blood clots 07/16/2017  . Irritable bowel syndrome   . Malignant hypertension   . Obesity   . OSA (obstructive sleep apnea)    uses cpap  . Pneumonia 2017  . Restless leg syndrome   . Stroke [facility]) 1995   mild      Past Surgical History:  Procedure Laterality Date  . CHOLECYSTECTOMY    . COLONOSCOPY WITH PROPOFOL N/A 03/31/2018   Procedure: COLONOSCOPY WITH PROPOFOL;  Surgeon: Lucilla Lame, MD;  Location[facility]  ENDOSCOPY;  Service: Endoscopy;  Laterality: N/A;  . KNEE ARTHROPLASTY Right 04/30/2019   Procedure: COMPUTER ASSISTED TOTAL KNEE ARTHROPLASTY;  Surgeon: Dereck Leep, MD;  Location: [facility] ORS;  Service: Orthopedics;  Laterality: Right;  . saliva gland removal  1998  . TOTAL KNEE ARTHROPLASTY Left 2014  . TUBAL LIGATION Bilateral     Social History:  Ambulatory  independently       reports that she has never smoked. She has never used smokeless tobacco. She reports that she does not drink alcohol and does not use drugs.   Family History:   Family History  Problem Relation Age of Onset  . Dementia Mother   . COPD Father        33  . Diabetes Father   .  Heart failure Brother        2010  . Breast cancer Neg Hx    ______________________________________________________________________________________________ Allergies: Allergies  Allergen Reactions  . Nuvigil [Armodafinil] Hives  . Penicillins Diarrhea and Nausea And Vomiting    Did it involve swelling of the face/tongue/throat, SOB, or low BP? no Did it involve sudden or severe rash/hives, skin peeling, or any reaction on the inside of your mouth or nose? No Did you need to seek medical attention at a hospital or doctor's office? No When did it last happen?in her 36s If all above answers are "NO", may proceed with cephalosporin use.   . Gabapentin Other (See Comments)    "wiped her out, couldn't stay awake"  . Provigil [Modafinil] Hives     Prior to Admission medications   Medication Sig Start Date End Date Taking? Authorizing Provider  acetaminophen (TYLENOL) 500 MG tablet Take 2 tablets (1,000 mg total) by mouth every 8 (eight) hours as needed for moderate pain. 04/09/17   Plonk, [name], MD  albuterol (ACCUNEB) 1.25 MG/3ML nebulizer solution Take 1 ampule by nebulization every 6 (six) hours as needed for wheezing.    [provider]  budesonide-formoterol (SYMBICORT) 160-4.5 MCG/ACT inhaler INHALE 2  PUFFS BY MOUTH 2 TIMES DAILY. 04/28/20 04/28/21  Glean Hess, MD  calcium-vitamin D (OSCAL 500/200 D-3) 500-200 MG-UNIT tablet Take 1 tablet by mouth daily with breakfast.    [provider]  cetirizine (ZYRTEC) 10 MG tablet Take 1 tablet (10 mg total) by mouth daily. Patient taking differently: Take 10 mg by mouth every morning. 12/07/14   Ashok Norris, MD  cholestyramine (QUESTRAN) 4 g packet Take 1 packet (4 g total) and drink by mouth at bedtime. 07/12/20   Glean Hess, MD  cholestyramine Lucrezia Starch) 4 GM/DOSE powder MIX 4 GM AND DRINK BY MOUTH DAILY AT BEDTIME. 07/12/20   Glean Hess, MD  lisinopril (ZESTRIL) 10 MG tablet TAKE 1 TABLET BY MOUTH DAILY. 07/12/20 07/12/21  Glean Hess, MD  multivitamin-iron-minerals-folic acid (CENTRUM) chewable tablet Chew 1 tablet by mouth daily.    [provider]  pantoprazole (PROTONIX) 40 MG tablet TAKE 1 TABLET BY MOUTH DAILY 06/09/20 06/09/21  Glean Hess, MD  predniSONE (DELTASONE) 20 MG tablet Take 1 tablet (20 mg total) by mouth daily with breakfast. 09/04/20   Flora Lipps, MD  promethazine (PHENERGAN) 25 MG tablet TAKE 1 TABLET BY MOUTH EVERY 6 HOURS AS NEEDED FOR NAUSEA OR VOMITING. 08/16/20 08/16/21  Glean Hess, MD  rOPINIRole (REQUIP) 1 MG tablet TAKE 1 TABLET BY MOUTH IN THE MORNING AND AT BEDTIME AND AS NEEDED THROUGHOUT THE DAY IF NEEDED Patient taking differently: Take 1 mg by mouth at bedtime. 08/16/20 08/16/21  Glean Hess, MD  Tiotropium Bromide Monohydrate 1.25 MCG/ACT AERS INHALE 2 PUFFS INTO THE LUNGS ONCE DAILY 03/24/20 03/24/21  Flora Lipps, MD    ___________________________________________________________________________________________________ Physical Exam: Vitals with BMI 09/19/2020 09/19/2020 09/19/2020  Height - - -  Weight - - -  BMI - - -  Systolic - 244 010  Diastolic - 51 48  Pulse 272 104 102     1. General:  in No  Acute distress   Chronically ill -appearing 2.  Psychological: Alert and  Oriented 3. Head/ENT:     Dry Mucous Membranes                          Head Non traumatic, neck supple  Poor Dentition 4. SKIN:   decreased Skin turgor,  Skin clean Dry and intact no rash 5. Heart: Regular rate and rhythm no Murmur, no Rub or gallop 6. Lungs:some wheezes no crackles   7. Abdomen: Soft,  non-tender, Non distended obese bowel sounds present 8. Lower extremities: no clubbing, cyanosis, no  edema 9. Neurologically Grossly intact, moving all 4 extremities equally   10. MSK: Normal range of motion    Chart has been reviewed  ______________________________________________________________________________________________  Assessment/Plan  70 y.o. female with medical history significant of Asthma, OSA on CPAP, HTN,   morbid obesity, anxiety, depression, GERD, vitamin B12 deficiency, hypertension, CKD    Admitted for  asthma/COPD exacerbation   Present on Admission: . Asthma ?COPD exacerbation - Will initiate: Steroid taper  -  Antibiotics  Doxycycline, - Albuterol  PRN, - scheduled duoneb,  -  Breo or Dulera at discharge   -  Mucinex.  Titrate O2 to saturation >90%. Follow patients respiratory status.   nfluenza PCR neg ABG WNL  Currently mentating well no evidence of symptomatic hypercarbia  . Restless leg syndrome stable continue home  . Prediabetes -check hemoglobin A1c  . Microcytic anemia -anemia panel . Marland Kitchen Essential hypertension -switch to norvasc given AKI  Chronic diastolic CHF -give gentle fluids given significant tachycardia continue to monitor currently does not appear to be fluid overload  . Acid reflux -PPI  Morbid obesity - nutritional consult indicated Other plan as per orders.  AKI - obtain urine electrolytes, hold lisinopril gentle fluids  OSA - on CPAP  DVT prophylaxis:   Lovenox       Code Status:    Code Status: Prior FULL CODE  as per patient   I had personally discussed CODE  STATUS with patient     Family Communication:   Family   at  Bedside SOn   Disposition Plan:     To home once workup is complete and patient is stable   Following barriers for discharge:                            Electrolytes corrected                               Anemia stable                      Would benefit from PT/OT eval prior to DC  Ordered                                      Consults called: none   Admission status:  ED Disposition    ED Disposition Condition [place]: [place] [[id number]]  Level of Care: Progressive Cardiac [[id number]]  Admit to Progressive based on following criteria: CARDIOVASCULAR & THORACIC of moderate stability with acute coronary syndrome symptoms/low risk myocardial infarction/hypertensive urgency/arrhythmias/heart failure potentially compromising stability and stable post cardiovascular intervention patients.  Covid Evaluation: Confirmed COVID Negative  Diagnosis: Asthma exacerbation [[id number]]  Admitting Physician: Toy Baker [[id number]]  Attending Physician: Toy Baker [[id number]]  Estimated length of stay: past midnight tomorrow  Certification:: I certify this patient will need inpatient services for at least 2 midnights          inpatient     I  Expect 2 midnight stay secondary to severity of patient's current illness need for inpatient interventions justified by the following:  hemodynamic instability despite optimal treatment (tachycardia hypoxia )   and extensive comorbidities including:  CHF   COPD/asthma  Morbid Obesity .  CKD   That are currently affecting medical management.   I expect  patient to be hospitalized for 2 midnights requiring inpatient medical care.  Patient is at high risk for adverse outcome (such as loss of life or disability) if not treated.  Indication for inpatient stay as follows:    Hemodynamic instability despite maximal medical therapy,    New or  worsening hypoxia  Need for  , IV fluids, IV sterodis    Level of care progressive tele indefinitely please discontinue once patient no longer qualifies COVID-19 Labs    Lab Results  Component Value Date   [place] 09/19/2020     Precautions: admitted as Covid Negative     PPE: Used by the provider:   N95  eye Goggles,  Gloves       Samantha Mason 09/20/2020, 1:41 AM    [facility]     after 2 AM please page floor coverage PA If 7AM-7PM, please contact the day team taking care of the patient using [email or web address]   Patient was evaluated in the context of the global COVID-19 pandemic, which necessitated consideration that the patient might be at risk for infection with the SARS-CoV-2 virus that causes COVID-19. Institutional protocols and algorithms that pertain to the evaluation of patients at risk for COVID-19 are in a state of rapid change based on information released by regulatory bodies including the CDC and federal and state organizations. These policies and algorithms were followed during the patient's care.

## 2020-09-19 NOTE — Progress Notes (Signed)
PHARMACIST - PHYSICIAN COMMUNICATION  CONCERNING:  Enoxaparin (Lovenox) for DVT Prophylaxis    RECOMMENDATION: Patient was prescribed enoxaprin 40mg  q24 hours for VTE prophylaxis.   Filed Weights   09/19/20 1646  Weight: (!) 146.1 kg (322 lb)    Body mass index is 55.27 kg/m.  Estimated Creatinine Clearance: 47.9 mL/min (A) (by C-G formula based on SCr of 1.62 mg/dL (H)).   Based on [place] patient is candidate for enoxaparin 0.5mg /kg TBW SQ every 24 hours based on BMI being >30.  DESCRIPTION: Pharmacy has adjusted enoxaparin dose per [facility] [place] policy.  Patient is now receiving enoxaparin 0.5 mg/kg every 24 hours   Renda Rolls, PharmD, [facility] 09/19/2020 11:22 PM

## 2020-09-20 ENCOUNTER — Encounter: Payer: Self-pay | Admitting: Internal Medicine

## 2020-09-20 ENCOUNTER — Other Ambulatory Visit: Payer: Self-pay

## 2020-09-20 DIAGNOSIS — K219 Gastro-esophageal reflux disease without esophagitis: Secondary | ICD-10-CM | POA: Diagnosis not present

## 2020-09-20 DIAGNOSIS — J4551 Severe persistent asthma with (acute) exacerbation: Secondary | ICD-10-CM | POA: Diagnosis not present

## 2020-09-20 DIAGNOSIS — J441 Chronic obstructive pulmonary disease with (acute) exacerbation: Secondary | ICD-10-CM | POA: Diagnosis not present

## 2020-09-20 DIAGNOSIS — Z96653 Presence of artificial knee joint, bilateral: Secondary | ICD-10-CM | POA: Diagnosis not present

## 2020-09-20 DIAGNOSIS — K2100 Gastro-esophageal reflux disease with esophagitis, without bleeding: Secondary | ICD-10-CM

## 2020-09-20 DIAGNOSIS — Z9989 Dependence on other enabling machines and devices: Secondary | ICD-10-CM | POA: Diagnosis not present

## 2020-09-20 DIAGNOSIS — I10 Essential (primary) hypertension: Secondary | ICD-10-CM | POA: Diagnosis not present

## 2020-09-20 DIAGNOSIS — Z79899 Other long term (current) drug therapy: Secondary | ICD-10-CM | POA: Diagnosis not present

## 2020-09-20 DIAGNOSIS — D509 Iron deficiency anemia, unspecified: Secondary | ICD-10-CM

## 2020-09-20 DIAGNOSIS — J9601 Acute respiratory failure with hypoxia: Secondary | ICD-10-CM | POA: Diagnosis not present

## 2020-09-20 DIAGNOSIS — J449 Chronic obstructive pulmonary disease, unspecified: Secondary | ICD-10-CM | POA: Diagnosis not present

## 2020-09-20 DIAGNOSIS — R7303 Prediabetes: Secondary | ICD-10-CM | POA: Diagnosis not present

## 2020-09-20 DIAGNOSIS — G4733 Obstructive sleep apnea (adult) (pediatric): Secondary | ICD-10-CM | POA: Diagnosis not present

## 2020-09-20 DIAGNOSIS — Z85038 Personal history of other malignant neoplasm of large intestine: Secondary | ICD-10-CM | POA: Diagnosis not present

## 2020-09-20 DIAGNOSIS — Z20822 Contact with and (suspected) exposure to covid-19: Secondary | ICD-10-CM | POA: Diagnosis not present

## 2020-09-20 LAB — COMPREHENSIVE METABOLIC PANEL
ALT: 19 U/L (ref 0–44)
AST: 27 U/L (ref 15–41)
Albumin: 3.4 g/dL — ABNORMAL LOW (ref 3.5–5.0)
Alkaline Phosphatase: 71 U/L (ref 38–126)
Anion gap: 12 (ref 5–15)
BUN: 27 mg/dL — ABNORMAL HIGH (ref 8–23)
CO2: 24 mmol/L (ref 22–32)
Calcium: 9.3 mg/dL (ref 8.9–10.3)
Chloride: 101 mmol/L (ref 98–111)
Creatinine, Ser: 1.38 mg/dL — ABNORMAL HIGH (ref 0.44–1.00)
GFR, Estimated: 42 mL/min — ABNORMAL LOW (ref >60)
Glucose, Bld: 192 mg/dL — ABNORMAL HIGH (ref 70–99)
Potassium: 3.8 mmol/L (ref 3.5–5.1)
Sodium: 137 mmol/L (ref 135–145)
Total Bilirubin: 0.7 mg/dL (ref 0.3–1.2)
Total Protein: 7.4 g/dL (ref 6.5–8.1)

## 2020-09-20 LAB — CBC WITH DIFFERENTIAL/PLATELET
Abs Immature Granulocytes: 0.06 10*3/uL (ref 0.00–0.07)
Basophils Absolute: 0 10*3/uL (ref 0.0–0.1)
Basophils Relative: 0 %
Eosinophils Absolute: 0 10*3/uL (ref 0.0–0.5)
Eosinophils Relative: 0 %
HCT: 36.5 % (ref 36.0–46.0)
Hemoglobin: 11 g/dL — ABNORMAL LOW (ref 12.0–15.0)
Immature Granulocytes: 1 %
Lymphocytes Relative: 6 %
Lymphs Abs: 0.6 10*3/uL — ABNORMAL LOW (ref 0.7–4.0)
MCH: 24.5 pg — ABNORMAL LOW (ref 26.0–34.0)
MCHC: 30.1 g/dL (ref 30.0–36.0)
MCV: 81.3 fL (ref 80.0–100.0)
Monocytes Absolute: 0.1 10*3/uL (ref 0.1–1.0)
Monocytes Relative: 0 %
Neutro Abs: 10.5 10*3/uL — ABNORMAL HIGH (ref 1.7–7.7)
Neutrophils Relative %: 93 %
Platelets: 300 10*3/uL (ref 150–400)
RBC: 4.49 MIL/uL (ref 3.87–5.11)
RDW: 16.5 % — ABNORMAL HIGH (ref 11.5–15.5)
WBC: 11.3 10*3/uL — ABNORMAL HIGH (ref 4.0–10.5)
nRBC: 0 % (ref 0.0–0.2)

## 2020-09-20 LAB — FERRITIN: Ferritin: 31 ng/mL (ref 11–307)

## 2020-09-20 LAB — IRON AND TIBC
Iron: 19 ug/dL — ABNORMAL LOW (ref 28–170)
Saturation Ratios: 5 % — ABNORMAL LOW (ref 10.4–31.8)
TIBC: 388 ug/dL (ref 250–450)
UIBC: 369 ug/dL

## 2020-09-20 LAB — PHOSPHORUS: Phosphorus: 3 mg/dL (ref 2.5–4.6)

## 2020-09-20 LAB — TSH: TSH: 0.488 u[IU]/mL (ref 0.350–4.500)

## 2020-09-20 LAB — MAGNESIUM: Magnesium: 1.9 mg/dL (ref 1.7–2.4)

## 2020-09-20 LAB — RETICULOCYTES
Immature Retic Fract: 10.1 % (ref 2.3–15.9)
RBC.: 4.59 MIL/uL (ref 3.87–5.11)
Retic Count, Absolute: 63.3 10*3/uL (ref 19.0–186.0)
Retic Ct Pct: 1.4 % (ref 0.4–3.1)

## 2020-09-20 LAB — MRSA PCR SCREENING: MRSA by PCR: NEGATIVE

## 2020-09-20 LAB — VITAMIN B12: Vitamin B-12: 476 pg/mL (ref 180–914)

## 2020-09-20 LAB — FOLATE: Folate: 19.5 ng/mL (ref >5.9)

## 2020-09-20 MED ORDER — AMLODIPINE BESYLATE 5 MG PO TABS
5.0000 mg | ORAL_TABLET | Freq: Every day | ORAL | Status: DC
  Administered 2020-09-20 – 2020-09-22 (×3): 5 mg via ORAL
  Filled 2020-09-20 (×3): qty 1

## 2020-09-20 MED ORDER — METHYLPREDNISOLONE SODIUM SUCC 125 MG IJ SOLR: 60.0000 mg | Freq: Two times a day (BID) | INTRAMUSCULAR | Status: DC

## 2020-09-20 MED ORDER — ARFORMOTEROL TARTRATE 15 MCG/2ML IN NEBU
15.0000 ug | INHALATION_SOLUTION | Freq: Two times a day (BID) | RESPIRATORY_TRACT | Status: DC
  Administered 2020-09-20 – 2020-09-22 (×4): 15 ug via RESPIRATORY_TRACT
  Filled 2020-09-20 (×7): qty 2

## 2020-09-20 MED ORDER — LABETALOL HCL 5 MG/ML IV SOLN
INTRAVENOUS | Status: AC
  Filled 2020-09-20: qty 4

## 2020-09-20 MED ORDER — BUDESONIDE 0.25 MG/2ML IN SUSP
0.2500 mg | Freq: Two times a day (BID) | RESPIRATORY_TRACT | Status: DC
  Administered 2020-09-20 – 2020-09-22 (×5): 0.25 mg via RESPIRATORY_TRACT
  Filled 2020-09-20 (×5): qty 2

## 2020-09-20 MED ORDER — LABETALOL HCL 5 MG/ML IV SOLN
20.0000 mg | INTRAVENOUS | Status: DC | PRN
  Administered 2020-09-20 – 2020-09-22 (×3): 20 mg via INTRAVENOUS
  Filled 2020-09-20 (×2): qty 4

## 2020-09-20 MED ORDER — BENZONATATE 100 MG PO CAPS
100.0000 mg | ORAL_CAPSULE | Freq: Three times a day (TID) | ORAL | Status: DC
  Administered 2020-09-20 – 2020-09-22 (×6): 100 mg via ORAL
  Filled 2020-09-20 (×6): qty 1

## 2020-09-20 MED ORDER — PREDNISONE 20 MG PO TABS
40.0000 mg | ORAL_TABLET | Freq: Every day | ORAL | Status: DC
  Administered 2020-09-21: 40 mg via ORAL
  Filled 2020-09-20: qty 2

## 2020-09-20 NOTE — Evaluation (Signed)
Physical Therapy Evaluation Patient Details Name: Samantha Mason MRN: [id number] DOB: 15-Jan-1951 Today's Date: 09/20/2020   History of Present Illness  Pt is a 70 y.o. female with medical history significant of asthma, OSA on CPAP, HTN, morbid obesity, anxiety, depression, GERD, vitamin B12 deficiency, hypertension, CKD, who presented with worsening SOB and tachypnea.  MD assessment includes: COPD exacerbation, microcytic anemia, and AKI.    Clinical Impression  Pt was pleasant and motivated to participate during the session. Pt's SpO2 on room air at rest was 94% and HR 93 bpm.  After amb 125' pt's SpO2 was 97% and HR 100 bpm.  Pt was Ind with all functional tasks and was steady during gait assessment without an AD.  Pt took one standing rest break during ambulation and reported feeling decreased activity tolerance compared to her baseline.  Pt stated that she had no need of continued PT services and that she would follow up with her MD if that changes.  Will complete PT orders at this time but will reassess pt pending a change in status upon receipt of new PT orders.       Follow Up Recommendations No PT follow up    Equipment Recommendations  None recommended by PT    Recommendations for Other Services       Precautions / Restrictions Precautions Precautions: Fall Restrictions Weight Bearing Restrictions: No      Mobility  Bed Mobility Overal bed mobility: Independent             General bed mobility comments: Able to perform with ease    Transfers Overall transfer level: Independent Equipment used: None             General transfer comment: Good eccentric and concentric control and stability  Ambulation/Gait  Ind Amb without AD Gait Distance (Feet): 125 Feet Assistive device: None Gait Pattern/deviations: WFL(Within Functional Limits) Gait velocity: WFL   General Gait Details: Pt required one short standing rest break during ambulation with SpO2 and HR  WNL on room air  Stairs            Wheelchair Mobility    Modified Rankin (Stroke Patients Only)       Balance Overall balance assessment: No apparent balance deficits (not formally assessed)                                           Pertinent Vitals/Pain Pain Assessment: 0-10 Pain Score: 8  Pain Location: headache Pain Descriptors / Indicators: Headache Pain Intervention(s): Patient requesting pain meds-RN notified    Home Living Family/patient expects to be discharged to:: Private residence Living Arrangements: Children Available Help at Discharge: Family;Available 24 hours/day Type of Home: House Home Access: Stairs to enter Entrance Stairs-Rails: Chemical engineer of Steps: 2 in front and 3 in back Home Layout: One level Home Equipment: Bedside commode;Walker - 2 wheels;Walker - 4 wheels;Cane - single point Additional Comments: has access to late mom's DME but does not use herself, lives with son    Prior Function Level of Independence: Independent         Comments: Independent with ADLs and functional mobility without AD. No falls in past six months. Pt works as Occupational psychologist at [employer] and reported being on her feet/walking "95% of the time".     Hand Dominance        Extremity/Trunk Assessment  Upper Extremity Assessment Upper Extremity Assessment: Overall WFL for tasks assessed    Lower Extremity Assessment Lower Extremity Assessment: Overall WFL for tasks assessed    Cervical / Trunk Assessment Cervical / Trunk Assessment: Normal  Communication   Communication: No difficulties  Cognition Arousal/Alertness: Awake/alert Behavior During Therapy: WFL for tasks assessed/performed Overall Cognitive Status: Within Functional Limits for tasks assessed                                        General Comments General comments (skin integrity, edema, etc.): SpO2 93-96% on RA during functional  mobility of short household distances and ADLs    Exercises Other Exercises Other Exercises: Education on energy conservation strategies (i.e., pursed lipped breathing and seated rest breaks)   Assessment/Plan    PT Assessment Patent does not need any further PT services  PT Problem List         PT Treatment Interventions      PT Goals (Current goals can be found in the Care Plan section)  Acute Rehab PT Goals Patient Stated Goal: to go back to work PT Goal Formulation: All assessment and education complete, DC therapy    Frequency     Barriers to discharge        Co-evaluation               AM-PAC PT "6 Clicks" Mobility  Outcome Measure Help needed turning from your back to your side while in a flat bed without using bedrails?: None Help needed moving from lying on your back to sitting on the side of a flat bed without using bedrails?: None Help needed moving to and from a bed to a chair (including a wheelchair)?: None Help needed standing up from a chair using your arms (e.g., wheelchair or bedside chair)?: None Help needed to walk in hospital room?: None Help needed climbing 3-5 steps with a railing? : None 6 Click Score: 24    End of Session   Activity Tolerance: Patient tolerated treatment well Patient left: in chair;with call bell/phone within reach Nurse Communication: Mobility status PT Visit Diagnosis: Muscle weakness (generalized) (M62.81)    Time: 3086-5784 PT Time Calculation (min) (ACUTE ONLY): 19 min   Charges:   PT Evaluation $PT Eval Low Complexity: 1 Low          D. Scott Falon Huesca PT, DPT 09/20/20, 11:01 AM

## 2020-09-20 NOTE — ED Notes (Signed)
Urine specimen requested, urine cup provided.

## 2020-09-20 NOTE — Evaluation (Signed)
Occupational Therapy Evaluation Patient Details Name: Samantha Mason MRN: [id number] DOB: 07/18/1950 Today's Date: 09/20/2020    History of Present Illness 70 y.o. female with medical history significant of Asthma, OSA on CPAP, HTN,   morbid obesity, anxiety, depression, GERD, vitamin B12 deficiency, hypertension, CKD, who presented with worsening SOB noted to tachypnea   Clinical Impression   Pt seen for OT evaluation this date. Prior to hospital admission, pt was independent in all aspects of ADL/IADL and functional mobility w/out AD. Pt denies falls history in past 6 months. Pt lives with her son in a 1 story home with 2-3 steps to enter with hand rail available. This date, pt able to perform bed mobility, functional mobility of short household distances (~40 ft), toilet transfers, toilet hygiene, and standing hand hygiene, with SPO2 >93% throughout session while on RA. Pt educated on energy conservation strategies, including pursed lip breathing and seated rest breaks, with pt verbalizing understanding. Pt currently demonstrates baseline independence to perform ADLs and mobility tasks and no strength, sensory, coordination, cognitive, or visual deficits appreciated with assessment. No additional skilled OT needs identified at this time. Will sign off. Please re-consult if additional OT needs arise.    Follow Up Recommendations  No OT follow up    Equipment Recommendations  None recommended by OT       Precautions / Restrictions Precautions Precautions: Fall Restrictions Weight Bearing Restrictions: No      Mobility Bed Mobility Overal bed mobility: Independent             General bed mobility comments: Able to perform with ease    Transfers Overall transfer level: Independent Equipment used: None                  Balance Overall balance assessment: Independent                                         ADL either performed or assessed with  clinical judgement   ADL Overall ADL's : Independent                                       General ADL Comments: Independent with functional mobility of short household distances (~40 ft), toilet transfers, toilet hygiene, and standing hand hygiene, with SPO2 >93% throughout while on RA     Vision Baseline Vision/History: Wears glasses Wears Glasses: Reading only Patient Visual Report: No change from baseline              Pertinent Vitals/Pain Pain Assessment: 0-10 Pain Score: 8  Pain Location: headache Pain Descriptors / Indicators: Aching Pain Intervention(s): Limited activity within patient's tolerance;Monitored during session;Patient requesting pain meds-RN notified        Extremity/Trunk Assessment Upper Extremity Assessment Upper Extremity Assessment: Overall WFL for tasks assessed   Lower Extremity Assessment Lower Extremity Assessment: Overall WFL for tasks assessed   Cervical / Trunk Assessment Cervical / Trunk Assessment: Normal   Communication Communication Communication: No difficulties   Cognition Arousal/Alertness: Awake/alert Behavior During Therapy: WFL for tasks assessed/performed Overall Cognitive Status: Within Functional Limits for tasks assessed  General Comments  SpO2 93-96% on RA during functional mobility of short household distances and ADLs    Exercises Other Exercises Other Exercises: Education on energy conservation strategies (i.e., pursed lipped breathing and seated rest breaks)        Home Living Family/patient expects to be discharged to:: Private residence Living Arrangements: Children Available Help at Discharge: Family;Available 24 hours/day Type of Home: House Home Access: Stairs to enter [employer] of Steps: 2 in front and 3 in back Entrance Stairs-Rails: Left;Right Home Layout: One level     Bathroom Shower/Tub: Tub/shower unit          Home Equipment: Bedside commode;Walker - 2 wheels;Walker - 4 wheels;Cane - single point   Additional Comments: has access to [employer] DME but does not use herself      Prior Functioning/Environment Level of Independence: Independent        Comments: Independent with ADLs and functional mobility without AD. No falls in past six months. Pt works as Occupational psychologist at [employer] List: Cardiopulmonary status limiting activity      OT Treatment/Interventions:      OT Goals(Current goals can be found in the care plan section) Acute Rehab OT Goals Patient Stated Goal: to go back to work OT Goal Formulation: With patient Time For Goal Achievement: 10/04/20 Potential to Achieve Goals: Good  OT Frequency:      AM-PAC OT "6 Clicks" Daily Activity     Outcome Measure Help from another person eating meals?: None Help from another person taking care of personal grooming?: None Help from another person toileting, which includes using toliet, bedpan, or urinal?: None Help from another person bathing (including washing, rinsing, drying)?: None Help from another person to put on and taking off regular upper body clothing?: None Help from another person to put on and taking off regular lower body clothing?: A Little 6 Click Score: 23   End of Session Equipment Utilized During Treatment: Oxygen Nurse Communication: Mobility status  Activity Tolerance: Patient tolerated treatment well Patient left: in bed;with call bell/phone within reach  OT Visit Diagnosis: Muscle weakness (generalized) (M62.81)                Time: 9528-4132 OT Time Calculation (min): 23 min Charges:  OT General Charges $OT Visit: 1 Visit OT Evaluation $OT Eval Moderate Complexity: 1 Mod OT Treatments $Self Care/Home Management : 8-22 mins  Fredirick Maudlin, OTR/L [place]

## 2020-09-20 NOTE — Progress Notes (Signed)
[facility]                                                                              Patient Demographics  Samantha Mason, is a 70 y.o. female, DOB - 03-04-1951, HKV:[id number]  Admit date - 09/19/2020   Admitting Physician Toy Baker, MD  Outpatient Primary MD for the patient is Glean Hess, MD  Outpatient specialists:   LOS - 1  days   Medical records reviewed and are as summarized below:    Chief Complaint  Patient presents with  . Chest Pain  . Wheezing       Brief summary   Patient is a 70 year old female with history of asthma, OSA on CPAP, GERD, depression, vitamin B12 deficiency, hypertension, CKD, recent diagnosis of diastolic CHF presented with chest tightness and wheezing similar to prior asthma attacks.  Patient was noted to be hypoxic down to 89% on room air and was placed on O2.  Last admission in April 2022 with asthma exacerbation.  Patient reported that she was home only for 3 days and started to feel sick again.  She followed up with pulmonology, was given Z-Pak and prednisone.  Patient was admitted for acute hypoxic respiratory failure due to asthma exacerbation   Assessment & Plan    Principal Problem:   Acute respiratory failure with hypoxia (HCC) secondary to asthma exacerbation -Improving however still has wheezing bilaterally and not at baseline yet -Continue IV Solu-Medrol 60 mg every 6 hours today and hopefully will be able to transition to oral prednisone in a.m. -Continue scheduled duo nebs, placed on Tessalon, Brovana, Pulmicort -Home O2 evaluation prior to discharge -Wean O2 as tolerated.  Flutter valve  Active Problems:   OSA on CPAP -Continue CPAP qhs  Chronic diastolic CHF -DC IV fluids to avoid fluid overload -2D echo in 08/2020 had shown EF of 55 to 60% with G1 DD    Essential hypertension -BP currently stable  GERD -Continue PPI  AKI on CKD stage 3a -Baseline creatinine 1.3, presented with  creatinine of 1.6 -Patient was placed on gentle hydration, creatinine has now improved to baseline 1.3, DC IV fluids  Morbid obesity Estimated body mass index is 55.27 kg/m as calculated from the following:   Height as of this encounter: 5\' 4"  (1.626 m).   Weight as of this encounter: 146.1 kg.  Code Status: full  DVT Prophylaxis:  lovenox    Level of Care: Level of care: Progressive Cardiac Family Communication: Discussed all imaging results, lab results, explained to the patient    Disposition Plan:     Status is: Observation  The patient remains OBS appropriate and will d/c before 2 midnights.  Dispo: The patient is from: Home              Anticipated d/c is to: Home              Patient currently is not medically stable to d/c.  Still wheezing, not at baseline, hopefully will DC home in a.m.   Difficult to place patient No      Time Spent in minutes 35 minutes  Procedures:  None  Consultants:   None  Antimicrobials:   Anti-infectives (From admission, onward)   Start     Dose/Rate Route Frequency Ordered Stop   09/19/20 2245  doxycycline (VIBRA-TABS) tablet 100 mg        100 mg Oral Every 12 hours 09/19/20 2231 09/24/20 2159         Medications  Scheduled Meds: . amLODipine  5 mg Oral Daily  . arformoterol  15 mcg Nebulization BID  . benzonatate  100 mg Oral TID  . budesonide (PULMICORT) nebulizer solution  0.25 mg Nebulization BID  . doxycycline  100 mg Oral Q12H  . enoxaparin (LOVENOX) injection  0.5 mg/kg Subcutaneous Q24H  . ipratropium-albuterol  3 mL Nebulization Q6H  . loratadine  10 mg Oral Daily  . methylPREDNISolone (SOLU-MEDROL) injection  60 mg Intravenous Q6H  . pantoprazole  40 mg Oral Daily  . [START ON 09/21/2020] predniSONE  40 mg Oral Q breakfast  . rOPINIRole  1 mg Oral QHS   Continuous Infusions: PRN Meds:.acetaminophen **OR** acetaminophen, albuterol, HYDROcodone-acetaminophen, ipratropium-albuterol,  labetalol      Subjective:   Tzippy Testerman was seen and examined today.  O2 sats 93% on 2 L, still wheezing, shortness of breath on exertion.  Patient denies dizziness,  abdominal pain, N/V/D/C, new weakness, numbess, tingling. No acute events overnight.    Objective:   Vitals:   09/20/20 1000 09/20/20 1004 09/20/20 1349 09/20/20 1523  BP: (!) 148/82   (!) 156/57  Pulse: 88   98  Resp:    20  Temp: 98.2 F (36.8 C)   98.1 F (36.7 C)  TempSrc: Oral   Oral  SpO2: 93% 93% 97% 97%  Weight:      Height:        Intake/Output Summary (Last 24 hours) at 09/20/2020 1618 Last data filed at 09/20/2020 1300 Gross per 24 hour  Intake 50 ml  Output 450 ml  Net -400 ml     Wt Readings from Last 3 Encounters:  09/19/20 (!) 146.1 kg  08/29/20 (!) 145.2 kg  03/24/20 (!) 142.5 kg     Exam  General: Alert and oriented x 3, NAD  Cardiovascular: S1 S2 auscultated, no murmurs, RRR  Respiratory: Bilateral expiratory wheezing  Gastrointestinal: Soft, nontender, nondistended, + bowel sounds  Ext: no pedal edema bilaterally  Neuro: no neuro deficits  Musculoskeletal: No digital cyanosis, clubbing  Skin: No rashes  Psych: Normal affect and demeanor, alert and oriented x3    Data Reviewed:  I have personally reviewed following labs and imaging studies  Micro Results Recent Results (from the past 240 hour(s))  Resp Panel by RT-PCR (Flu A&B, Covid) Nasopharyngeal Swab     Status: None   Collection Time: 09/19/20  5:43 PM   Specimen: Nasopharyngeal Swab; Nasopharyngeal(NP) swabs in vial transport medium  Result Value Ref Range Status   SARS Coronavirus 2 by RT PCR NEGATIVE NEGATIVE Final    Comment: (NOTE) SARS-CoV-2 target nucleic acids are NOT DETECTED.  The SARS-CoV-2 RNA is generally detectable in upper respiratory specimens during the acute phase of infection. The lowest concentration of SARS-CoV-2 viral copies this assay can detect is 138 copies/mL. A negative result  does not preclude SARS-Cov-2 infection and should not be used as the sole basis for treatment or other patient management decisions. A negative result may occur with  improper specimen collection/handling, submission of specimen other than nasopharyngeal swab, presence of viral mutation(s) within the areas targeted by this assay, and inadequate  number of viral copies(<138 copies/mL). A negative result must be combined with clinical observations, patient history, and epidemiological information. The expected result is Negative.  Fact Sheet for Patients:  EntrepreneurPulse.com.au  Fact Sheet for Healthcare Providers:  IncredibleEmployment.be  This test is no t yet approved or cleared by the [place] FDA and  has been authorized for detection and/or diagnosis of SARS-CoV-2 by FDA under an Emergency Use Authorization (EUA). This EUA will remain  in effect (meaning this test can be used) for the duration of the COVID-19 declaration under Section 564(b)(1) of the Act, 21 U.S.C.section 360bbb-3(b)(1), unless the authorization is terminated  or revoked sooner.       Influenza A by PCR NEGATIVE NEGATIVE Final   Influenza B by PCR NEGATIVE NEGATIVE Final    Comment: (NOTE) The Xpert Xpress SARS-CoV-2/FLU/RSV plus assay is intended as an aid in the diagnosis of influenza from Nasopharyngeal swab specimens and should not be used as a sole basis for treatment. Nasal washings and aspirates are unacceptable for Xpert Xpress SARS-CoV-2/FLU/RSV testing.  Fact Sheet for Patients: EntrepreneurPulse.com.au  Fact Sheet for Healthcare Providers: IncredibleEmployment.be  This test is not yet approved or cleared by the [place] FDA and has been authorized for detection and/or diagnosis of SARS-CoV-2 by FDA under an Emergency Use Authorization (EUA). This EUA will remain in effect (meaning this test can be used) for  the duration of the COVID-19 declaration under Section 564(b)(1) of the Act, 21 U.S.C. section 360bbb-3(b)(1), unless the authorization is terminated or revoked.  Performed at [facility], [address] [place], [place] [address]   MRSA PCR Screening     Status: None   Collection Time: 09/19/20 11:25 PM   Specimen: Nasopharyngeal Swab  Result Value Ref Range Status   MRSA by PCR NEGATIVE NEGATIVE Final    Comment:        The GeneXpert MRSA Assay (FDA approved for NASAL specimens only), is one component of a comprehensive MRSA colonization surveillance program. It is not intended to diagnose MRSA infection nor to guide or monitor treatment for MRSA infections. Performed at [facility], [address] [place], [place] [address]     Radiology Reports DG Chest 2 View  Result Date: 09/19/2020 CLINICAL DATA:  Shortness of breath EXAM: CHEST - 2 VIEW COMPARISON:  August 29, 2020 chest radiograph and chest CT FINDINGS: Lungs are clear. Heart size and pulmonary vascularity are normal. No adenopathy. No pneumothorax. No bone lesions. IMPRESSION: Lungs clear.  Cardiac silhouette normal. Electronically Signed   By: Lowella Grip III M.D.   On: 09/19/2020 17:19   DG Chest 2 View  Result Date: 08/29/2020 CLINICAL DATA:  Shortness of breath EXAM: CHEST - 2 VIEW COMPARISON:  02/10/2020 FINDINGS: Cardiac shadow is within normal limits. The lungs are well aerated bilaterally. Mild bronchitic changes are again identified and similar to that seen on the prior exam likely of a more chronic nature. No focal infiltrate or effusion is seen. No bony abnormality is noted. IMPRESSION: Chronic bronchitic changes without acute abnormality. Electronically Signed   By: Inez Catalina M.D.   On: 08/29/2020 14:14   CT Angio Chest PE W and/or Wo Contrast  Result Date: 08/29/2020 CLINICAL DATA:  71 year old with shortness of breath and chest pain. EXAM: CT ANGIOGRAPHY CHEST WITH CONTRAST  TECHNIQUE: Multidetector CT imaging of the chest was performed using the standard protocol during bolus administration of intravenous contrast. Multiplanar CT image reconstructions and MIPs were obtained to evaluate the  vascular anatomy. CONTRAST:  86mL OMNIPAQUE IOHEXOL 350 MG/ML SOLN COMPARISON:  Chest CT 05/07/2019 FINDINGS: Cardiovascular: No evidence for filling defects in the main or lobar pulmonary arteries. Limited evaluation of the segmental pulmonary arteries and more distal branches. Heart size is within normal limits. No significant pericardial fluid. Atherosclerotic calcification involving thoracic aorta without aneurysm. Mediastinum/Nodes: No significant lymph node enlargement in the mediastinum or hilar regions. No axillary lymph node enlargement. Lungs/Pleura: Trachea and mainstem bronchi are patent. No large pleural effusions. Patchy densities along the periphery of the right lower lobe on sequence 6 image 46 are new. Questionable tiny nodule in the periphery of the right upper lobe on sequence 6, image 18 is probably chronic. Ground-glass opacities in the right upper lobe are similar to the previous examination may be related to areas of air trapping. Subtle ground-glass opacities in left upper lobe are nonspecific. Upper Abdomen: Cholecystectomy. No acute abnormality in the visualized upper abdomen. Musculoskeletal: No acute bone abnormality. Review of the MIP images confirms the above findings. IMPRESSION: 1. Subtle patchy densities in the right lower lobe. Findings could represent a small area of infection in the right lower lobe. Additional ground-glass areas in the upper lobes which could be related air trapping versus atelectasis. 2. No evidence for a large or central pulmonary embolism. Study has technical limitations but there is no evidence for pulmonary embolism in the main or lobar pulmonary arteries. 3.  Aortic Atherosclerosis (ICD10-I70.0). Electronically Signed   By: Markus Daft M.D.    On: 08/29/2020 15:47   ECHOCARDIOGRAM COMPLETE  Result Date: 08/31/2020    ECHOCARDIOGRAM REPORT   Patient Name:   AFRICA MASAKI Date of Exam: 08/31/2020 Medical Rec #:  [id number]        Height:       64.0 in Accession #:    [id number]       Weight:       320.0 lb Date of Birth:  17-Mar-1951       BSA:          2.389 m Patient Age:    36 years         BP:           147/68 mmHg Patient Gender: F                HR:           107 bpm. Exam Location:  [facility] Procedure: 2D Echo, Cardiac Doppler and Color Doppler Indications:     Dyspnea R06.00  History:         Patient has no prior history of Echocardiogram examinations.                  COPD and Stroke; Risk Factors:Hypertension.  Sonographer:     Sherrie Sport RDCS (AE) Referring Phys:  [id number] Jennye Boroughs Diagnosing Phys: Ida Rogue MD  Sonographer Comments: Suboptimal apical window and Technically challenging study due to limited acoustic windows. IMPRESSIONS  1. Left ventricular ejection fraction, by estimation, is 55 to 60%. The left ventricle has normal function. The left ventricle has no regional wall motion abnormalities. Left ventricular diastolic parameters are consistent with Grade I diastolic dysfunction (impaired relaxation).  2. Right ventricular systolic function is normal. The right ventricular size is normal.  3. Challenging image quality. FINDINGS  Left Ventricle: Left ventricular ejection fraction, by estimation, is 55 to 60%. The left ventricle has normal function. The left ventricle has no regional wall motion abnormalities. The left ventricular  internal cavity size was normal in size. There is  no left ventricular hypertrophy. Left ventricular diastolic parameters are consistent with Grade I diastolic dysfunction (impaired relaxation). Right Ventricle: The right ventricular size is normal. No increase in right ventricular wall thickness. Right ventricular systolic function is normal. Left Atrium: Left atrial size was normal in size. Right  Atrium: Right atrial size was normal in size. Pericardium: There is no evidence of pericardial effusion. Mitral Valve: The mitral valve is normal in structure. No evidence of mitral valve regurgitation. No evidence of mitral valve stenosis. Tricuspid Valve: The tricuspid valve is normal in structure. Tricuspid valve regurgitation is not demonstrated. No evidence of tricuspid stenosis. Aortic Valve: The aortic valve was not well visualized. Aortic valve regurgitation is not visualized. No aortic stenosis is present. Aortic valve mean gradient measures 4.5 mmHg. Aortic valve peak gradient measures 8.1 mmHg. Aortic valve area, by VTI measures 2.10 cm. Pulmonic Valve: The pulmonic valve was normal in structure. Pulmonic valve regurgitation is not visualized. No evidence of pulmonic stenosis. Aorta: The aortic root is normal in size and structure. Venous: The inferior vena cava is normal in size with greater than 50% respiratory variability, suggesting right atrial pressure of 3 mmHg. IAS/Shunts: No atrial level shunt detected by color flow Doppler.  LEFT VENTRICLE PLAX 2D LVIDd:         3.83 cm  Diastology LVIDs:         2.63 cm  LV e' medial:    5.55 cm/s LV PW:         1.33 cm  LV E/e' medial:  19.8 LV IVS:        1.04 cm  LV e' lateral:   6.85 cm/s LVOT diam:     2.00 cm  LV E/e' lateral: 16.1 LV SV:         61 LV SV Index:   25 LVOT Area:     3.14 cm  RIGHT VENTRICLE RV Basal diam:  3.33 cm RV S prime:     20.70 cm/s TAPSE (M-mode): 5.3 cm LEFT ATRIUM             Index       RIGHT ATRIUM           Index LA diam:        3.90 cm 1.63 cm/m  RA Area:     15.20 cm LA Vol (A2C):   51.9 ml 21.73 ml/m RA Volume:   33.90 ml  14.19 ml/m LA Vol (A4C):   47.9 ml 20.05 ml/m LA Biplane Vol: 49.9 ml 20.89 ml/m  AORTIC VALVE AV Area (Vmax):    2.26 cm AV Area (Vmean):   2.02 cm AV Area (VTI):     2.10 cm AV Vmax:           142.00 cm/s AV Vmean:          98.850 cm/s AV VTI:            0.289 m AV Peak Grad:      8.1 mmHg  AV Mean Grad:      4.5 mmHg LVOT Vmax:         102.00 cm/s LVOT Vmean:        63.700 cm/s LVOT VTI:          0.193 m LVOT/AV VTI ratio: 0.67  AORTA Ao Root diam: 3.10 cm MITRAL VALVE                TRICUSPID  VALVE MV Area (PHT): 6.96 cm     TR Peak grad:   11.7 mmHg MV Decel Time: 109 msec     TR Vmax:        171.00 cm/s MV E velocity: 110.00 cm/s MV A velocity: 144.00 cm/s  SHUNTS MV E/A ratio:  0.76         Systemic VTI:  0.19 m                             Systemic Diam: 2.00 cm Ida Rogue MD Electronically signed by Ida Rogue MD Signature Date/Time: 08/31/2020/5:29:21 PM    Final     Lab Data:  CBC: Recent Labs  Lab 09/19/20 1650 09/20/20 0408  WBC 12.3* 11.3*  NEUTROABS  --  10.5*  HGB 10.9* 11.0*  HCT 35.3* 36.5  MCV 79.7* 81.3  PLT 312 300   Basic Metabolic Panel: Recent Labs  Lab 09/19/20 1650 09/20/20 0408  NA 140 137  K 4.0 3.8  CL 104 101  CO2 26 24  GLUCOSE 133* 192*  BUN 30* 27*  CREATININE 1.62* 1.38*  CALCIUM 9.6 9.3  MG  --  1.9  PHOS  --  3.0   GFR: Estimated Creatinine Clearance: 56.2 mL/min (A) (by C-G formula based on SCr of 1.38 mg/dL (H)). Liver Function Tests: Recent Labs  Lab 09/20/20 0408  AST 27  ALT 19  ALKPHOS 71  BILITOT 0.7  PROT 7.4  ALBUMIN 3.4*   No results for input(s): LIPASE, AMYLASE in the last 168 hours. No results for input(s): AMMONIA in the last 168 hours. Coagulation Profile: No results for input(s): INR, PROTIME in the last 168 hours. Cardiac Enzymes: No results for input(s): CKTOTAL, CKMB, CKMBINDEX, TROPONINI in the last 168 hours. BNP (last 3 results) No results for input(s): PROBNP in the last 8760 hours. HbA1C: No results for input(s): HGBA1C in the last 72 hours. CBG: No results for input(s): GLUCAP in the last 168 hours. Lipid Profile: No results for input(s): CHOL, HDL, LDLCALC, TRIG, CHOLHDL, LDLDIRECT in the last 72 hours. Thyroid Function Tests: Recent Labs    09/20/20 0408  TSH 0.488    Anemia Panel: Recent Labs    09/20/20 0408  VITAMINB12 476  FOLATE 19.5  FERRITIN 31  TIBC 388  IRON 19*  RETICCTPCT 1.4   Urine analysis:    Component Value Date/Time   COLORURINE STRAW (A) 04/19/2019 1314   APPEARANCEUR CLEAR (A) 04/19/2019 1314   APPEARANCEUR Clear 04/27/2012 0958   LABSPEC 1.008 04/19/2019 1314   LABSPEC 1.014 04/27/2012 0958   PHURINE 5.0 04/19/2019 1314   GLUCOSEU NEGATIVE 04/19/2019 1314   GLUCOSEU Negative 04/27/2012 0958   HGBUR SMALL (A) 04/19/2019 1314   BILIRUBINUR neg 02/16/2020 1134   BILIRUBINUR Negative 04/27/2012 0958   KETONESUR NEGATIVE 04/19/2019 1314   PROTEINUR Negative 02/16/2020 1134   PROTEINUR NEGATIVE 04/19/2019 1314   UROBILINOGEN 0.2 02/16/2020 1134   NITRITE neg 02/16/2020 1134   NITRITE NEGATIVE 04/19/2019 1314   LEUKOCYTESUR Negative 02/16/2020 1134   LEUKOCYTESUR NEGATIVE 04/19/2019 1314   LEUKOCYTESUR Negative 04/27/2012 0958     Casandra Dallaire M.D. [facility] 09/20/2020, 4:18 PM  Available via Epic secure chat 7am-7pm After 7 pm, please refer to night coverage provider listed on amion.

## 2020-09-21 DIAGNOSIS — R7303 Prediabetes: Secondary | ICD-10-CM | POA: Diagnosis not present

## 2020-09-21 DIAGNOSIS — K219 Gastro-esophageal reflux disease without esophagitis: Secondary | ICD-10-CM | POA: Diagnosis not present

## 2020-09-21 DIAGNOSIS — Z9989 Dependence on other enabling machines and devices: Secondary | ICD-10-CM

## 2020-09-21 DIAGNOSIS — J9601 Acute respiratory failure with hypoxia: Secondary | ICD-10-CM | POA: Diagnosis not present

## 2020-09-21 DIAGNOSIS — Z20822 Contact with and (suspected) exposure to covid-19: Secondary | ICD-10-CM | POA: Diagnosis not present

## 2020-09-21 DIAGNOSIS — I10 Essential (primary) hypertension: Secondary | ICD-10-CM | POA: Diagnosis not present

## 2020-09-21 DIAGNOSIS — G4733 Obstructive sleep apnea (adult) (pediatric): Secondary | ICD-10-CM

## 2020-09-21 DIAGNOSIS — J4551 Severe persistent asthma with (acute) exacerbation: Secondary | ICD-10-CM | POA: Diagnosis not present

## 2020-09-21 DIAGNOSIS — Z96653 Presence of artificial knee joint, bilateral: Secondary | ICD-10-CM | POA: Diagnosis not present

## 2020-09-21 DIAGNOSIS — Z79899 Other long term (current) drug therapy: Secondary | ICD-10-CM | POA: Diagnosis not present

## 2020-09-21 DIAGNOSIS — Z85038 Personal history of other malignant neoplasm of large intestine: Secondary | ICD-10-CM | POA: Diagnosis not present

## 2020-09-21 DIAGNOSIS — D509 Iron deficiency anemia, unspecified: Secondary | ICD-10-CM | POA: Diagnosis not present

## 2020-09-21 DIAGNOSIS — J441 Chronic obstructive pulmonary disease with (acute) exacerbation: Secondary | ICD-10-CM | POA: Diagnosis not present

## 2020-09-21 DIAGNOSIS — J449 Chronic obstructive pulmonary disease, unspecified: Secondary | ICD-10-CM | POA: Diagnosis not present

## 2020-09-21 DIAGNOSIS — K2100 Gastro-esophageal reflux disease with esophagitis, without bleeding: Secondary | ICD-10-CM | POA: Diagnosis not present

## 2020-09-21 LAB — TROPONIN I (HIGH SENSITIVITY): Troponin I (High Sensitivity): 10 ng/L (ref <18)

## 2020-09-21 LAB — BRAIN NATRIURETIC PEPTIDE: B Natriuretic Peptide: 131.4 pg/mL — ABNORMAL HIGH (ref 0.0–100.0)

## 2020-09-21 MED ORDER — PREDNISONE 10 MG PO TABS: 10.0000 mg | ORAL_TABLET | Freq: Every day | ORAL | Status: DC

## 2020-09-21 MED ORDER — CHOLESTYRAMINE LIGHT 4 G PO PACK
4.0000 g | PACK | Freq: Every day | ORAL | Status: DC
  Administered 2020-09-21: 4 g via ORAL
  Filled 2020-09-21 (×2): qty 1

## 2020-09-21 MED ORDER — FUROSEMIDE 10 MG/ML IJ SOLN: 20.0000 mg | Freq: Once | INTRAMUSCULAR | Status: DC

## 2020-09-21 MED ORDER — PREDNISONE 20 MG PO TABS: 30.0000 mg | ORAL_TABLET | Freq: Every day | ORAL | Status: DC

## 2020-09-21 MED ORDER — IPRATROPIUM-ALBUTEROL 0.5-2.5 (3) MG/3ML IN SOLN
3.0000 mL | Freq: Three times a day (TID) | RESPIRATORY_TRACT | Status: DC
  Administered 2020-09-21 – 2020-09-22 (×3): 3 mL via RESPIRATORY_TRACT
  Filled 2020-09-21 (×3): qty 3

## 2020-09-21 MED ORDER — PREDNISONE 20 MG PO TABS
40.0000 mg | ORAL_TABLET | Freq: Every day | ORAL | Status: DC
  Administered 2020-09-22: 40 mg via ORAL
  Filled 2020-09-21: qty 2

## 2020-09-21 MED ORDER — PREDNISONE 20 MG PO TABS: 20.0000 mg | ORAL_TABLET | Freq: Every day | ORAL | Status: DC

## 2020-09-21 NOTE — Consult Note (Signed)
NAME:  Samantha Mason, MRN:  [id number], DOB:  [birth year], LOS: 1 ADMISSION DATE:  09/19/2020, CONSULTATION DATE: 09/21/2020 REFERRING MD: Dr. Tana Coast, CHIEF COMPLAINT: Shortness of breath, wheezing, chest tightness  History of Present Illness:  Samantha Mason is a 70 year old female with a past medical history significant for asthma, OSA on CPAP, GERD, HFpEF, CKD, hypertension who presented to [facility] ED on 09/19/2020 with complaints of shortness of breath, chest tightness and pain between her shoulder blades, and wheezing.  She reported her symptoms were similar to prior asthma attacks.  Of note she was recently admitted to [facility] on April 12 through September 02 2020 with asthma exacerbation, was discharged home with plans for pulmonary follow-up outpatient.  3 days after discharge she began to exhibit symptoms again, which she was unable to be seen in the pulmonary office.  However she was prescribed a Z-Pak and prednisone by pulmonary.  However her symptoms continued to worsen prompting her to seek medical evaluation.   She reports her triggers for asthma include cigarette smoke (she does NOT smoke or allow others to smoke around her) and pollen.  She endorses GERD which he takes Protonix.  She does have 2 dogs that reside in her home and sleep in bed with her.  Prior to her most recent exacerbation in April, she endorses her last asthma exacerbation was approximately 3 years ago.  ED Course: In the ED she was found to be hypoxic requiring supplemental O2.  She was given Solu-Medrol, magnesium, and nebulizer treatments.  D-dimer was mildly elevated, she is however she is not a candidate for CTA due to bout acute kidney injury.  Troponins were within normal limits and EKG with no evidence of ischemic changes.  She was admitted to the progressive cardiac unit by hospitalist for further work-up and treatment of acute hypoxic respiratory failure in setting of asthma exacerbation.  PCCM has been consulted for  further assistance in management of asthma exacerbation.  Pertinent  Medical History  Asthma COPD OSA on CPAP Pneumonia Hypertension GERD Anxiety Depression Irritable bowel syndrome Obesity Stroke  Significant Hospital Events: Including procedures, antibiotic start and stop dates in addition to other pertinent events   . 09/19/2020: Presented to ED, required admission to progressive cardiac unit for asthma exacerbation . 09/21/2020: PCCM consulted, wean to room air, IV steroids transitioned to p.o. prednisone  Interim History / Subjective:  -Patient reports she is feeling better -Does report dyspnea on exertion, intermittent nonproductive cough, and tightness between her shoulder blades and right elbow -Currently on room air, hemodynamically stable -IV Solu-Medrol transitioned to p.o. prednisone today  Objective   Blood pressure (!) 145/50, pulse 90, temperature 97.7 F (36.5 C), resp. rate 18, height 5\' 4"  (1.626 m), weight (!) 147.1 kg, SpO2 94 %.        Intake/Output Summary (Last 24 hours) at 09/21/2020 1800 Last data filed at 09/21/2020 0901 Gross per 24 hour  Intake 360 ml  Output 725 ml  Net -365 ml   Filed Weights   09/19/20 1646 09/21/20 0505 09/21/20 0700  Weight: (!) 146.1 kg (!) 147.1 kg (!) 147.1 kg    Examination: General: Acutely ill-appearing female, sitting in bed, on room air, no acute distress HENT: Atraumatic, normocephalic, neck supple, no JVD Lungs: Clear diminished to auscultation throughout, no wheezing or rales noted, even, nonlabored Cardiovascular: Regular rate and rhythm, S1-S2, no murmurs, rubs, gallops Abdomen: Obese, soft, nontender, nondistended, no guarding or rebound tenderness, bowel sounds positive x4 Extremities: Normal bulk  and tone, no deformities, 1+ edema bilateral lower extremities Neuro: Awake, alert and oriented x4, follows commands, no focal deficits, speech clear GU: Deferred  Labs/imaging that I havepersonally reviewed   (right click and "Reselect all SmartList Selections" daily)  Labs 09/21/20: BNP 131, HS Troponin 10 CXR 09/21/20>>Lungs are clear. Heart size and pulmonary vascularity are normal. No adenopathy. No pneumothorax. No bone lesions.  Resolved Hospital Problem list     Assessment & Plan:   Acute Hypoxic Respiratory Failure in the setting of Asthma Exacerbation PMHx of OSA on CPAP, COPD -Supplemental O2 as needed to maintain O2 sats >92% -CPAP qhs -Follow intermittent CXR & ABG as needed -Transitioned from IV Solu-Medrol to Prednisone today, orders placed for taper -Scheduled Bronchodilators & PRN -Continue Brovana & Pulmicort nebs -Continue Doxycycline -Antitussives -Continue Protonix for GERD -Follow up with Dr. Mortimer Fries upon discharge  Best practice (right click and "Reselect all SmartList Selections" daily)  Diet:  Oral Pain/Anxiety/Delirium protocol (if indicated): No VAP protocol (if indicated): Not indicated DVT prophylaxis: LMWH GI prophylaxis: PPI Glucose control:  SSI No Central venous access:  N/A Arterial line:  N/A Foley:  N/A Mobility:  OOB  PT consulted: N/A Last date of multidisciplinary goals of care discussion [N/A] Code Status:  full code Disposition: Progressive Cardiac  Myself and Dr. Loanne Drilling updated pt and her son at bedside 09/21/20.  Labs   CBC: Recent Labs  Lab 09/19/20 1650 09/20/20 0408  WBC 12.3* 11.3*  NEUTROABS  --  10.5*  HGB 10.9* 11.0*  HCT 35.3* 36.5  MCV 79.7* 81.3  PLT 312 462    Basic Metabolic Panel: Recent Labs  Lab 09/19/20 1650 09/20/20 0408  NA 140 137  K 4.0 3.8  CL 104 101  CO2 26 24  GLUCOSE 133* 192*  BUN 30* 27*  CREATININE 1.62* 1.38*  CALCIUM 9.6 9.3  MG  --  1.9  PHOS  --  3.0   GFR: Estimated Creatinine Clearance: 56.5 mL/min (A) (by C-G formula based on SCr of 1.38 mg/dL (H)). Recent Labs  Lab 09/19/20 1650 09/20/20 0408  WBC 12.3* 11.3*    Liver Function Tests: Recent Labs  Lab 09/20/20 0408   AST 27  ALT 19  ALKPHOS 71  BILITOT 0.7  PROT 7.4  ALBUMIN 3.4*   No results for input(s): LIPASE, AMYLASE in the last 168 hours. No results for input(s): AMMONIA in the last 168 hours.  ABG    Component Value Date/Time   PHART 7.43 09/19/2020 2107   PCO2ART 41 09/19/2020 2107   PO2ART 85 09/19/2020 2107   HCO3 27.2 09/19/2020 2107   O2SAT 96.7 09/19/2020 2107     Coagulation Profile: No results for input(s): INR, PROTIME in the last 168 hours.  Cardiac Enzymes: No results for input(s): CKTOTAL, CKMB, CKMBINDEX, TROPONINI in the last 168 hours.  HbA1C: Hgb A1c MFr Bld  Date/Time Value Ref Range Status  02/16/2020 11:54 AM 5.7 (H) 4.8 - 5.6 % Final    Comment:    (NOTE) Pre diabetes:          5.7%-6.4%  Diabetes:              >6.4%  Glycemic control for   <7.0% adults with diabetes   04/19/2019 01:14 PM 6.1 (H) 4.8 - 5.6 % Final    Comment:    (NOTE) Pre diabetes:          5.7%-6.4% Diabetes:              >  6.4% Glycemic control for   <7.0% adults with diabetes     CBG: No results for input(s): GLUCAP in the last 168 hours.  Review of Systems:   Positives in BOLD: Gen: Denies fever, chills, weight change, fatigue, night sweats HEENT: Denies blurred vision, double vision, hearing loss, tinnitus, sinus congestion, rhinorrhea, sore throat, neck stiffness, dysphagia PULM: Denies shortness of breath, cough, sputum production, hemoptysis, wheezing CV: Denies chest pain, edema, orthopnea, paroxysmal nocturnal dyspnea, palpitations GI: Denies abdominal pain, nausea, vomiting, diarrhea, hematochezia, melena, constipation, change in bowel habits GU: Denies dysuria, hematuria, polyuria, oliguria, urethral discharge Endocrine: Denies hot or cold intolerance, polyuria, polyphagia or appetite change Derm: Denies rash, dry skin, scaling or peeling skin change Heme: Denies easy bruising, bleeding, bleeding gums Neuro: Denies headache, numbness, weakness, slurred speech,  loss of memory or consciousness   Past Medical History:  She,  has a past medical history of Acute bronchitis, Anemia, Anxiety and depression, Arthritis, Asthma, B12 deficiency (03/14/2017), Chest pain (07/02/2018), COPD (chronic obstructive pulmonary disease) ([place]) (07/24/2018), Family history of blood clots (07/16/2017), Irritable bowel syndrome, Malignant hypertension, Obesity, OSA (obstructive sleep apnea), Pneumonia (2017), Restless leg syndrome, and Stroke ([place]) (1995).   Surgical History:   Past Surgical History:  Procedure Laterality Date  . CHOLECYSTECTOMY    . COLONOSCOPY WITH PROPOFOL N/A 03/31/2018   Procedure: COLONOSCOPY WITH PROPOFOL;  Surgeon: Lucilla Lame, MD;  Location: [facility] ENDOSCOPY;  Service: Endoscopy;  Laterality: N/A;  . KNEE ARTHROPLASTY Right 04/30/2019   Procedure: COMPUTER ASSISTED TOTAL KNEE ARTHROPLASTY;  Surgeon: Dereck Leep, MD;  Location: [facility] ORS;  Service: Orthopedics;  Laterality: Right;  . saliva gland removal  1998  . TOTAL KNEE ARTHROPLASTY Left 2014  . TUBAL LIGATION Bilateral      Social History:   reports that she has never smoked. She has never used smokeless tobacco. She reports that she does not drink alcohol and does not use drugs.   Family History:  Her family history includes COPD in her father; Dementia in her mother; Diabetes in her father; Heart failure in her brother. There is no history of Breast cancer.   Allergies Allergies  Allergen Reactions  . Nuvigil [Armodafinil] Hives  . Penicillins Diarrhea and Nausea And Vomiting    Did it involve swelling of the face/tongue/throat, SOB, or low BP? no Did it involve sudden or severe rash/hives, skin peeling, or any reaction on the inside of your mouth or nose? No Did you need to seek medical attention at a hospital or doctor's office? No When did it last happen?in her 59s If all above answers are "NO", may proceed with cephalosporin use.   . Gabapentin Other (See Comments)     "wiped her out, couldn't stay awake"  . Provigil [Modafinil] Hives     Home Medications  Prior to Admission medications   Medication Sig Start Date End Date Taking? Authorizing Provider  acetaminophen (TYLENOL) 500 MG tablet Take 2 tablets (1,000 mg total) by mouth every 8 (eight) hours as needed for moderate pain. 04/09/17  Yes Plonk, [name], MD  albuterol (ACCUNEB) 1.25 MG/3ML nebulizer solution Take 1 ampule by nebulization every 6 (six) hours as needed for wheezing.   Yes [provider]  budesonide-formoterol (SYMBICORT) 160-4.5 MCG/ACT inhaler INHALE 2 PUFFS BY MOUTH 2 TIMES DAILY. 04/28/20 04/28/21 Yes Glean Hess, MD  calcium-vitamin D (OSCAL 500/200 D-3) 500-200 MG-UNIT tablet Take 1 tablet by mouth daily with breakfast.   Yes [provider]  cetirizine (ZYRTEC) 10  MG tablet Take 1 tablet (10 mg total) by mouth daily. Patient taking differently: Take 10 mg by mouth every morning. 12/07/14  Yes Ashok Norris, MD  cholestyramine (QUESTRAN) 4 g packet Take 1 packet (4 g total) and drink by mouth at bedtime. 07/12/20  Yes Glean Hess, MD  lisinopril (ZESTRIL) 10 MG tablet TAKE 1 TABLET BY MOUTH DAILY. 07/12/20 07/12/21 Yes Glean Hess, MD  multivitamin-iron-minerals-folic acid (CENTRUM) chewable tablet Chew 1 tablet by mouth daily.   Yes [provider]  pantoprazole (PROTONIX) 40 MG tablet TAKE 1 TABLET BY MOUTH DAILY 06/09/20 06/09/21 Yes Glean Hess, MD  promethazine (PHENERGAN) 25 MG tablet TAKE 1 TABLET BY MOUTH EVERY 6 HOURS AS NEEDED FOR NAUSEA OR VOMITING. 08/16/20 08/16/21 Yes Glean Hess, MD  rOPINIRole (REQUIP) 1 MG tablet TAKE 1 TABLET BY MOUTH IN THE MORNING AND AT BEDTIME AND AS NEEDED THROUGHOUT THE DAY IF NEEDED Patient taking differently: Take 1 mg by mouth at bedtime. 08/16/20 08/16/21 Yes Glean Hess, MD  Tiotropium Bromide Monohydrate 1.25 MCG/ACT AERS INHALE 2 PUFFS INTO THE LUNGS ONCE DAILY 03/24/20 03/24/21 Yes  Kasa, Maretta Bees, MD  cholestyramine Lucrezia Starch) 4 GM/DOSE powder MIX 4 GM AND DRINK BY MOUTH DAILY AT BEDTIME. 07/12/20   Glean Hess, MD  predniSONE (DELTASONE) 20 MG tablet Take 1 tablet (20 mg total) by mouth daily with breakfast. Patient not taking: Reported on 09/19/2020 09/04/20   Flora Lipps, MD     Critical care time: 50 minutes    Darel Hong, AGACNP-BC  Pulmonary & [facility] epic messenger for cross cover needs If after hours, please call E-link

## 2020-09-21 NOTE — Progress Notes (Signed)
[facility]                                                                              Patient Demographics  Samantha Mason, is a 70 y.o. female, DOB - August 31, 1950, WUX:[id number]  Admit date - 09/19/2020   Admitting Physician Toy Baker, MD  Outpatient Primary MD for the patient is Glean Hess, MD  Outpatient specialists:   LOS - 1  days   Medical records reviewed and are as summarized below:    Chief Complaint  Patient presents with  . Chest Pain  . Wheezing       Brief summary   Patient is a 70 year old female with history of asthma, OSA on CPAP, GERD, depression, vitamin B12 deficiency, hypertension, CKD, recent diagnosis of diastolic CHF presented with chest tightness and wheezing similar to prior asthma attacks.  Patient was noted to be hypoxic down to 89% on room air and was placed on O2.  Last admission in April 2022 with asthma exacerbation.  Patient reported that she was home only for 3 days and started to feel sick again.  She followed up with pulmonology, was given Z-Pak and prednisone.  Patient was admitted for acute hypoxic respiratory failure due to asthma exacerbation   Assessment & Plan    Principal Problem:   Acute respiratory failure with hypoxia (HCC) secondary to asthma exacerbation - wheezing improving significantly however still coughing and feels "pressure between shoulder blades, chest, elbow" -IV Solu-Medrol, transition to oral prednisone today  -Continue scheduled duo nebs, placed on Tessalon, Brovana, Pulmicort, doxycycline -Home O2 evaluation prior to discharge -O2 weaned off, currently doing well -BNP 131.4, troponin 10.  EKG showed sinus tachycardia but no significant ST-T wave changes suggestive of ischemia.   -Recent echo in 08/2020 had shown normal EF of 55 to 60% with grade 1 DD -Had CTA chest during previous admission in 08/2020 had shown no PE, subtle patchy densities in right lower lobe and upper lobes could  be air trapping versus atelectasis - will give  Lasix 20 mg IV x1, states that she was not able to follow-up after previous admission with pulmonology, requested consultation with Dr Mortimer Fries    Active Problems:   OSA on CPAP -Continue CPAP qhs  Mild acute on chronic diastolic CHF likely due to the fluid overload -IV fluids were discontinued on 5/4, BNP 131.4 -2D echo in 08/2020 had shown EF of 55 to 60% with G1 DD -Lasix 20 mg IV x1    Essential hypertension -BP currently stable  GERD -Continue PPI  AKI on CKD stage 3a -Baseline creatinine 1.3, presented with creatinine of 1.6 - Creatinine back to baseline, follow closely   Morbid obesity Estimated body mass index is 55.65 kg/m as calculated from the following:   Height as of this encounter: 5\' 4"  (1.626 m).   Weight as of this encounter: 147.1 kg.  Code Status: full  DVT Prophylaxis:  lovenox    Level of Care: Level of care: Progressive Cardiac Family Communication: Discussed all imaging results, lab results, explained to the patient and daughter on the phone   Disposition Plan:  Status is: Observation  The patient remains OBS appropriate and will d/c before 2 midnights.  Dispo: The patient is from: Home              Anticipated d/c is to: Home              Patient currently is not medically stable to d/c.  States she feels chest pressure and between the shoulder blades, pulmonology consulted   Difficult to place patient No      Time Spent in minutes 35 minutes  Procedures:  None  Consultants:   None  Antimicrobials:   Anti-infectives (From admission, onward)   Start     Dose/Rate Route Frequency Ordered Stop   09/19/20 2245  doxycycline (VIBRA-TABS) tablet 100 mg        100 mg Oral Every 12 hours 09/19/20 2231 09/24/20 2159         Medications  Scheduled Meds: . amLODipine  5 mg Oral Daily  . arformoterol  15 mcg Nebulization BID  . benzonatate  100 mg Oral TID  . budesonide (PULMICORT)  nebulizer solution  0.25 mg Nebulization BID  . cholestyramine light  4 g Oral Daily  . doxycycline  100 mg Oral Q12H  . enoxaparin (LOVENOX) injection  0.5 mg/kg Subcutaneous Q24H  . ipratropium-albuterol  3 mL Nebulization Q6H  . loratadine  10 mg Oral Daily  . pantoprazole  40 mg Oral Daily  . predniSONE  40 mg Oral Q breakfast  . rOPINIRole  1 mg Oral QHS   Continuous Infusions: PRN Meds:.acetaminophen **OR** acetaminophen, albuterol, HYDROcodone-acetaminophen, ipratropium-albuterol, labetalol      Subjective:   Samantha Mason was seen and examined today.  Lungs improving but states pressure in the chest and between the shoulder blades, elbow pain.  Patient denies dizziness,  abdominal pain, N/V/D/C, new weakness, numbess, tingling. No acute events overnight.    Objective:   Vitals:   09/21/20 0105 09/21/20 0505 09/21/20 0700 09/21/20 1318  BP:  (!) 142/64  (!) 144/62  Pulse:  87  95  Resp:  18  18  Temp:  97.7 F (36.5 C)  97.9 F (36.6 C)  TempSrc:    Oral  SpO2: 94% 92%  95%  Weight:  (!) 147.1 kg (!) 147.1 kg   Height:        Intake/Output Summary (Last 24 hours) at 09/21/2020 1545 Last data filed at 09/21/2020 0901 Gross per 24 hour  Intake 360 ml  Output 725 ml  Net -365 ml     Wt Readings from Last 3 Encounters:  09/21/20 (!) 147.1 kg  08/29/20 (!) 145.2 kg  03/24/20 (!) 142.5 kg   Physical Exam  General: Alert and oriented x 3, NAD  Cardiovascular: S1 S2 clear, RRR.  Trace pedal edema b/l  Respiratory: Diminished breath sound at bases but wheezing improving  Gastrointestinal: Soft, nontender, nondistended, NBS  Ext: trace pedal edema bilaterally  Neuro: no new deficits  Musculoskeletal: No cyanosis, clubbing  Skin: No rashes  Psych: Normal affect and demeanor, alert and oriented x3    Data Reviewed:  I have personally reviewed following labs and imaging studies  Micro Results Recent Results (from the past 240 hour(s))  Resp Panel by  RT-PCR (Flu A&B, Covid) Nasopharyngeal Swab     Status: None   Collection Time: 09/19/20  5:43 PM   Specimen: Nasopharyngeal Swab; Nasopharyngeal(NP) swabs in vial transport medium  Result Value Ref Range Status   SARS Coronavirus 2 by RT  PCR NEGATIVE NEGATIVE Final    Comment: (NOTE) SARS-CoV-2 target nucleic acids are NOT DETECTED.  The SARS-CoV-2 RNA is generally detectable in upper respiratory specimens during the acute phase of infection. The lowest concentration of SARS-CoV-2 viral copies this assay can detect is 138 copies/mL. A negative result does not preclude SARS-Cov-2 infection and should not be used as the sole basis for treatment or other patient management decisions. A negative result may occur with  improper specimen collection/handling, submission of specimen other than nasopharyngeal swab, presence of viral mutation(s) within the areas targeted by this assay, and inadequate number of viral copies(<138 copies/mL). A negative result must be combined with clinical observations, patient history, and epidemiological information. The expected result is Negative.  Fact Sheet for Patients:  EntrepreneurPulse.com.au  Fact Sheet for Healthcare Providers:  IncredibleEmployment.be  This test is no t yet approved or cleared by the [place] FDA and  has been authorized for detection and/or diagnosis of SARS-CoV-2 by FDA under an Emergency Use Authorization (EUA). This EUA will remain  in effect (meaning this test can be used) for the duration of the COVID-19 declaration under Section 564(b)(1) of the Act, 21 U.S.C.section 360bbb-3(b)(1), unless the authorization is terminated  or revoked sooner.       Influenza A by PCR NEGATIVE NEGATIVE Final   Influenza B by PCR NEGATIVE NEGATIVE Final    Comment: (NOTE) The Xpert Xpress SARS-CoV-2/FLU/RSV plus assay is intended as an aid in the diagnosis of influenza from Nasopharyngeal swab  specimens and should not be used as a sole basis for treatment. Nasal washings and aspirates are unacceptable for Xpert Xpress SARS-CoV-2/FLU/RSV testing.  Fact Sheet for Patients: EntrepreneurPulse.com.au  Fact Sheet for Healthcare Providers: IncredibleEmployment.be  This test is not yet approved or cleared by the [place] FDA and has been authorized for detection and/or diagnosis of SARS-CoV-2 by FDA under an Emergency Use Authorization (EUA). This EUA will remain in effect (meaning this test can be used) for the duration of the COVID-19 declaration under Section 564(b)(1) of the Act, 21 U.S.C. section 360bbb-3(b)(1), unless the authorization is terminated or revoked.  Performed at [facility], [address] [place], [place] [address]   MRSA PCR Screening     Status: None   Collection Time: 09/19/20 11:25 PM   Specimen: Nasopharyngeal Swab  Result Value Ref Range Status   MRSA by PCR NEGATIVE NEGATIVE Final    Comment:        The GeneXpert MRSA Assay (FDA approved for NASAL specimens only), is one component of a comprehensive MRSA colonization surveillance program. It is not intended to diagnose MRSA infection nor to guide or monitor treatment for MRSA infections. Performed at [facility], [address] [place], [place] [address]     Radiology Reports DG Chest 2 View  Result Date: 09/19/2020 CLINICAL DATA:  Shortness of breath EXAM: CHEST - 2 VIEW COMPARISON:  August 29, 2020 chest radiograph and chest CT FINDINGS: Lungs are clear. Heart size and pulmonary vascularity are normal. No adenopathy. No pneumothorax. No bone lesions. IMPRESSION: Lungs clear.  Cardiac silhouette normal. Electronically Signed   By: Lowella Grip III M.D.   On: 09/19/2020 17:19   DG Chest 2 View  Result Date: 08/29/2020 CLINICAL DATA:  Shortness of breath EXAM: CHEST - 2 VIEW COMPARISON:  02/10/2020 FINDINGS: Cardiac shadow  is within normal limits. The lungs are well aerated bilaterally. Mild bronchitic changes are again identified and similar to that seen on the prior  exam likely of a more chronic nature. No focal infiltrate or effusion is seen. No bony abnormality is noted. IMPRESSION: Chronic bronchitic changes without acute abnormality. Electronically Signed   By: Inez Catalina M.D.   On: 08/29/2020 14:14   CT Angio Chest PE W and/or Wo Contrast  Result Date: 08/29/2020 CLINICAL DATA:  70 year old with shortness of breath and chest pain. EXAM: CT ANGIOGRAPHY CHEST WITH CONTRAST TECHNIQUE: Multidetector CT imaging of the chest was performed using the standard protocol during bolus administration of intravenous contrast. Multiplanar CT image reconstructions and MIPs were obtained to evaluate the vascular anatomy. CONTRAST:  41mL OMNIPAQUE IOHEXOL 350 MG/ML SOLN COMPARISON:  Chest CT 05/07/2019 FINDINGS: Cardiovascular: No evidence for filling defects in the main or lobar pulmonary arteries. Limited evaluation of the segmental pulmonary arteries and more distal branches. Heart size is within normal limits. No significant pericardial fluid. Atherosclerotic calcification involving thoracic aorta without aneurysm. Mediastinum/Nodes: No significant lymph node enlargement in the mediastinum or hilar regions. No axillary lymph node enlargement. Lungs/Pleura: Trachea and mainstem bronchi are patent. No large pleural effusions. Patchy densities along the periphery of the right lower lobe on sequence 6 image 46 are new. Questionable tiny nodule in the periphery of the right upper lobe on sequence 6, image 18 is probably chronic. Ground-glass opacities in the right upper lobe are similar to the previous examination may be related to areas of air trapping. Subtle ground-glass opacities in left upper lobe are nonspecific. Upper Abdomen: Cholecystectomy. No acute abnormality in the visualized upper abdomen. Musculoskeletal: No acute bone  abnormality. Review of the MIP images confirms the above findings. IMPRESSION: 1. Subtle patchy densities in the right lower lobe. Findings could represent a small area of infection in the right lower lobe. Additional ground-glass areas in the upper lobes which could be related air trapping versus atelectasis. 2. No evidence for a large or central pulmonary embolism. Study has technical limitations but there is no evidence for pulmonary embolism in the main or lobar pulmonary arteries. 3.  Aortic Atherosclerosis (ICD10-I70.0). Electronically Signed   By: Markus Daft M.D.   On: 08/29/2020 15:47   ECHOCARDIOGRAM COMPLETE  Result Date: 08/31/2020    ECHOCARDIOGRAM REPORT   Patient Name:   DRISHTI PEPPERMAN Date of Exam: 08/31/2020 Medical Rec #:  [id number]        Height:       64.0 in Accession #:    [id number]       Weight:       320.0 lb Date of Birth:  04/30/1951       BSA:          2.389 m Patient Age:    89 years         BP:           147/68 mmHg Patient Gender: F                HR:           107 bpm. Exam Location:  [facility] Procedure: 2D Echo, Cardiac Doppler and Color Doppler Indications:     Dyspnea R06.00  History:         Patient has no prior history of Echocardiogram examinations.                  COPD and Stroke; Risk Factors:Hypertension.  Sonographer:     Sherrie Sport RDCS (AE) Referring Phys:  [place] Diagnosing Phys: Ida Rogue MD  Sonographer Comments: Suboptimal apical window  and Technically challenging study due to limited acoustic windows. IMPRESSIONS  1. Left ventricular ejection fraction, by estimation, is 55 to 60%. The left ventricle has normal function. The left ventricle has no regional wall motion abnormalities. Left ventricular diastolic parameters are consistent with Grade I diastolic dysfunction (impaired relaxation).  2. Right ventricular systolic function is normal. The right ventricular size is normal.  3. Challenging image quality. FINDINGS  Left Ventricle: Left  ventricular ejection fraction, by estimation, is 55 to 60%. The left ventricle has normal function. The left ventricle has no regional wall motion abnormalities. The left ventricular internal cavity size was normal in size. There is  no left ventricular hypertrophy. Left ventricular diastolic parameters are consistent with Grade I diastolic dysfunction (impaired relaxation). Right Ventricle: The right ventricular size is normal. No increase in right ventricular wall thickness. Right ventricular systolic function is normal. Left Atrium: Left atrial size was normal in size. Right Atrium: Right atrial size was normal in size. Pericardium: There is no evidence of pericardial effusion. Mitral Valve: The mitral valve is normal in structure. No evidence of mitral valve regurgitation. No evidence of mitral valve stenosis. Tricuspid Valve: The tricuspid valve is normal in structure. Tricuspid valve regurgitation is not demonstrated. No evidence of tricuspid stenosis. Aortic Valve: The aortic valve was not well visualized. Aortic valve regurgitation is not visualized. No aortic stenosis is present. Aortic valve mean gradient measures 4.5 mmHg. Aortic valve peak gradient measures 8.1 mmHg. Aortic valve area, by VTI measures 2.10 cm. Pulmonic Valve: The pulmonic valve was normal in structure. Pulmonic valve regurgitation is not visualized. No evidence of pulmonic stenosis. Aorta: The aortic root is normal in size and structure. Venous: The inferior vena cava is normal in size with greater than 50% respiratory variability, suggesting right atrial pressure of 3 mmHg. IAS/Shunts: No atrial level shunt detected by color flow Doppler.  LEFT VENTRICLE PLAX 2D LVIDd:         3.83 cm  Diastology LVIDs:         2.63 cm  LV e' medial:    5.55 cm/s LV PW:         1.33 cm  LV E/e' medial:  19.8 LV IVS:        1.04 cm  LV e' lateral:   6.85 cm/s LVOT diam:     2.00 cm  LV E/e' lateral: 16.1 LV SV:         61 LV SV Index:   25 LVOT Area:      3.14 cm  RIGHT VENTRICLE RV Basal diam:  3.33 cm RV S prime:     20.70 cm/s TAPSE (M-mode): 5.3 cm LEFT ATRIUM             Index       RIGHT ATRIUM           Index LA diam:        3.90 cm 1.63 cm/m  RA Area:     15.20 cm LA Vol (A2C):   51.9 ml 21.73 ml/m RA Volume:   33.90 ml  14.19 ml/m LA Vol (A4C):   47.9 ml 20.05 ml/m LA Biplane Vol: 49.9 ml 20.89 ml/m  AORTIC VALVE AV Area (Vmax):    2.26 cm AV Area (Vmean):   2.02 cm AV Area (VTI):     2.10 cm AV Vmax:           142.00 cm/s AV Vmean:          98.850  cm/s AV VTI:            0.289 m AV Peak Grad:      8.1 mmHg AV Mean Grad:      4.5 mmHg LVOT Vmax:         102.00 cm/s LVOT Vmean:        63.700 cm/s LVOT VTI:          0.193 m LVOT/AV VTI ratio: 0.67  AORTA Ao Root diam: 3.10 cm MITRAL VALVE                TRICUSPID VALVE MV Area (PHT): 6.96 cm     TR Peak grad:   11.7 mmHg MV Decel Time: 109 msec     TR Vmax:        171.00 cm/s MV E velocity: 110.00 cm/s MV A velocity: 144.00 cm/s  SHUNTS MV E/A ratio:  0.76         Systemic VTI:  0.19 m                             Systemic Diam: 2.00 cm Ida Rogue MD Electronically signed by Ida Rogue MD Signature Date/Time: 08/31/2020/5:29:21 PM    Final     Lab Data:  CBC: Recent Labs  Lab 09/19/20 1650 09/20/20 0408  WBC 12.3* 11.3*  NEUTROABS  --  10.5*  HGB 10.9* 11.0*  HCT 35.3* 36.5  MCV 79.7* 81.3  PLT 312 323   Basic Metabolic Panel: Recent Labs  Lab 09/19/20 1650 09/20/20 0408  NA 140 137  K 4.0 3.8  CL 104 101  CO2 26 24  GLUCOSE 133* 192*  BUN 30* 27*  CREATININE 1.62* 1.38*  CALCIUM 9.6 9.3  MG  --  1.9  PHOS  --  3.0   GFR: Estimated Creatinine Clearance: 56.5 mL/min (A) (by C-G formula based on SCr of 1.38 mg/dL (H)). Liver Function Tests: Recent Labs  Lab 09/20/20 0408  AST 27  ALT 19  ALKPHOS 71  BILITOT 0.7  PROT 7.4  ALBUMIN 3.4*   No results for input(s): LIPASE, AMYLASE in the last 168 hours. No results for input(s): AMMONIA in the last 168  hours. Coagulation Profile: No results for input(s): INR, PROTIME in the last 168 hours. Cardiac Enzymes: No results for input(s): CKTOTAL, CKMB, CKMBINDEX, TROPONINI in the last 168 hours. BNP (last 3 results) No results for input(s): PROBNP in the last 8760 hours. HbA1C: No results for input(s): HGBA1C in the last 72 hours. CBG: No results for input(s): GLUCAP in the last 168 hours. Lipid Profile: No results for input(s): CHOL, HDL, LDLCALC, TRIG, CHOLHDL, LDLDIRECT in the last 72 hours. Thyroid Function Tests: Recent Labs    09/20/20 0408  TSH 0.488   Anemia Panel: Recent Labs    09/20/20 0408  VITAMINB12 476  FOLATE 19.5  FERRITIN 31  TIBC 388  IRON 19*  RETICCTPCT 1.4   Urine analysis:    Component Value Date/Time   COLORURINE STRAW (A) 04/19/2019 1314   APPEARANCEUR CLEAR (A) 04/19/2019 1314   APPEARANCEUR Clear 04/27/2012 0958   LABSPEC 1.008 04/19/2019 1314   LABSPEC 1.014 04/27/2012 0958   PHURINE 5.0 04/19/2019 1314   GLUCOSEU NEGATIVE 04/19/2019 1314   GLUCOSEU Negative 04/27/2012 0958   HGBUR SMALL (A) 04/19/2019 1314   BILIRUBINUR neg 02/16/2020 1134   BILIRUBINUR Negative 04/27/2012 [place] 04/19/2019 1314   PROTEINUR Negative 02/16/2020 1134  PROTEINUR NEGATIVE 04/19/2019 1314   UROBILINOGEN 0.2 02/16/2020 1134   NITRITE neg 02/16/2020 1134   NITRITE NEGATIVE 04/19/2019 1314   LEUKOCYTESUR Negative 02/16/2020 1134   LEUKOCYTESUR NEGATIVE 04/19/2019 1314   LEUKOCYTESUR Negative 04/27/2012 0958     Charne Mcbrien M.D. [facility] 09/21/2020, 3:45 PM  Available via Epic secure chat 7am-7pm After 7 pm, please refer to night coverage provider listed on amion.

## 2020-09-22 ENCOUNTER — Other Ambulatory Visit: Payer: Self-pay

## 2020-09-22 ENCOUNTER — Telehealth: Payer: Self-pay

## 2020-09-22 ENCOUNTER — Other Ambulatory Visit (HOSPITAL_COMMUNITY): Payer: Self-pay

## 2020-09-22 DIAGNOSIS — R7303 Prediabetes: Secondary | ICD-10-CM | POA: Diagnosis not present

## 2020-09-22 DIAGNOSIS — J4551 Severe persistent asthma with (acute) exacerbation: Secondary | ICD-10-CM | POA: Diagnosis not present

## 2020-09-22 DIAGNOSIS — K2100 Gastro-esophageal reflux disease with esophagitis, without bleeding: Secondary | ICD-10-CM | POA: Diagnosis not present

## 2020-09-22 DIAGNOSIS — J45901 Unspecified asthma with (acute) exacerbation: Secondary | ICD-10-CM | POA: Diagnosis not present

## 2020-09-22 DIAGNOSIS — J9601 Acute respiratory failure with hypoxia: Secondary | ICD-10-CM | POA: Diagnosis not present

## 2020-09-22 DIAGNOSIS — Z85038 Personal history of other malignant neoplasm of large intestine: Secondary | ICD-10-CM | POA: Diagnosis not present

## 2020-09-22 DIAGNOSIS — J441 Chronic obstructive pulmonary disease with (acute) exacerbation: Secondary | ICD-10-CM | POA: Diagnosis not present

## 2020-09-22 DIAGNOSIS — J449 Chronic obstructive pulmonary disease, unspecified: Secondary | ICD-10-CM | POA: Diagnosis not present

## 2020-09-22 DIAGNOSIS — D509 Iron deficiency anemia, unspecified: Secondary | ICD-10-CM | POA: Diagnosis not present

## 2020-09-22 DIAGNOSIS — I509 Heart failure, unspecified: Secondary | ICD-10-CM | POA: Diagnosis not present

## 2020-09-22 DIAGNOSIS — Z79899 Other long term (current) drug therapy: Secondary | ICD-10-CM | POA: Diagnosis not present

## 2020-09-22 DIAGNOSIS — K219 Gastro-esophageal reflux disease without esophagitis: Secondary | ICD-10-CM | POA: Diagnosis not present

## 2020-09-22 DIAGNOSIS — J45909 Unspecified asthma, uncomplicated: Secondary | ICD-10-CM | POA: Diagnosis not present

## 2020-09-22 DIAGNOSIS — G4733 Obstructive sleep apnea (adult) (pediatric): Secondary | ICD-10-CM | POA: Diagnosis not present

## 2020-09-22 DIAGNOSIS — Z9989 Dependence on other enabling machines and devices: Secondary | ICD-10-CM | POA: Diagnosis not present

## 2020-09-22 DIAGNOSIS — Z96653 Presence of artificial knee joint, bilateral: Secondary | ICD-10-CM | POA: Diagnosis not present

## 2020-09-22 DIAGNOSIS — I10 Essential (primary) hypertension: Secondary | ICD-10-CM | POA: Diagnosis not present

## 2020-09-22 DIAGNOSIS — Z20822 Contact with and (suspected) exposure to covid-19: Secondary | ICD-10-CM | POA: Diagnosis not present

## 2020-09-22 MED ORDER — ALBUTEROL SULFATE 1.25 MG/3ML IN NEBU
1.0000 | INHALATION_SOLUTION | Freq: Four times a day (QID) | RESPIRATORY_TRACT | 12 refills | Status: DC | PRN
  Filled 2020-09-22: qty 75, 7d supply, fill #0

## 2020-09-22 MED ORDER — BUDESONIDE-FORMOTEROL FUMARATE 160-4.5 MCG/ACT IN AERO
2.0000 | INHALATION_SPRAY | Freq: Two times a day (BID) | RESPIRATORY_TRACT | 3 refills | Status: DC
  Filled 2020-09-22: qty 10.2, 30d supply, fill #0
  Filled 2020-12-01: qty 10.2, 30d supply, fill #1

## 2020-09-22 MED ORDER — FLUTICASONE PROPIONATE 50 MCG/ACT NA SUSP
1.0000 | Freq: Every day | NASAL | 2 refills | Status: DC
  Filled 2020-09-22: qty 16, 60d supply, fill #0
  Filled 2020-09-22: qty 15.8, fill #0

## 2020-09-22 MED ORDER — ROPINIROLE HCL 1 MG PO TABS
1.0000 mg | ORAL_TABLET | Freq: Every day | ORAL | 3 refills | Status: DC
  Filled 2020-09-22: qty 30, 30d supply, fill #0

## 2020-09-22 MED ORDER — DOXYCYCLINE HYCLATE 100 MG PO TABS
100.0000 mg | ORAL_TABLET | Freq: Two times a day (BID) | ORAL | 0 refills | Status: AC
  Filled 2020-09-22: qty 8, 4d supply, fill #0

## 2020-09-22 MED ORDER — PREDNISONE 10 MG PO TABS
ORAL_TABLET | ORAL | 0 refills | Status: DC
  Filled 2020-09-22: qty 30, 12d supply, fill #0

## 2020-09-22 MED ORDER — BENZONATATE 100 MG PO CAPS
100.0000 mg | ORAL_CAPSULE | Freq: Three times a day (TID) | ORAL | 0 refills | Status: DC | PRN
  Filled 2020-09-22: qty 30, 10d supply, fill #0

## 2020-09-22 NOTE — Progress Notes (Addendum)
SATURATION QUALIFICATIONS: (This note is used to comply with regulatory documentation for home oxygen)  Patient Saturations on Room Air at Rest = 92%  Patient Saturations on Room Air while Ambulating = 88% End SpO2 90% on Room air.   Patient saturations on 2L nasal cannula while ambulating 95%    Please briefly explain why patient needs home oxygen: Patient dropped as low as 88% on RA while ambulating around nurses' station.

## 2020-09-22 NOTE — Progress Notes (Signed)
Patient discharged per orders. PIV/tele removed from patient. Discharged instructions provided, pt agreeable. Home O2 delivered. Patient taken down to vehicle.

## 2020-09-22 NOTE — Telephone Encounter (Signed)
Can patient come in next Wednesday, 09/27/20, at 1:40 PM for hospital follow-up?

## 2020-09-22 NOTE — Telephone Encounter (Signed)
Copied from [place] [phone]. Topic: Appointment Scheduling - Scheduling Inquiry for Clinic >> Sep 22, 2020 12:26 PM Yvette Rack wrote: Reason for CRM: Pt being discharged from hospital today and would like a call back to schedule hospital fu appt with Dr. Army Melia. Cb# [phone]

## 2020-09-22 NOTE — Plan of Care (Signed)

## 2020-09-22 NOTE — Discharge Summary (Addendum)
Physician Discharge Summary   Patient ID: Samantha Mason MRN: [id number] DOB/AGE: [birth year] 70 y.o.  Admit date: 09/19/2020 Discharge date: 09/22/2020  Primary Care Physician:  Glean Hess, MD   Recommendations for Outpatient Follow-up:  1. Follow up with PCP in 1-2 weeks 2. Outpatient referral to cardiology sent per patient's request  Home Health: None  Equipment/Devices: Home O2 evaluation qualifies for at least 2 L O2 while ambulating.  SATURATION QUALIFICATIONS: (This note is used to comply with regulatory documentation for home oxygen)  Patient Saturations on Room Air at Rest = 92%  Patient Saturations on Room Air while Ambulating = 88-91%   Please briefly explain why patient needs home oxygen: Patient dropped as low as 88% on RA while ambulating around nurses' station.   Discharge Condition: stable CODE STATUS: FULL Diet recommendation: Carb modified diet   Discharge Diagnoses:     . Acute respiratory failure with hypoxia (HCC) Asthma exacerbation OSA on CPAP Mild acute on chronic diastolic CHF likely due to fluid overload Acute kidney injury on CKD stage IIIa . Restless leg syndrome . Prediabetes . Microcytic anemia . Essential hypertension . GERD Morbid obesity   Consults: Pulmonology, Dr. Loanne Drilling    Allergies:   Allergies  Allergen Reactions  . Nuvigil [Armodafinil] Hives  . Penicillins Diarrhea and Nausea And Vomiting    Did it involve swelling of the face/tongue/throat, SOB, or low BP? no Did it involve sudden or severe rash/hives, skin peeling, or any reaction on the inside of your mouth or nose? No Did you need to seek medical attention at a hospital or doctor's office? No When did it last happen?in her 69s If all above answers are "NO", may proceed with cephalosporin use.   . Gabapentin Other (See Comments)    "wiped her out, couldn't stay awake"  . Provigil [Modafinil] Hives     DISCHARGE MEDICATIONS: Allergies as of  09/22/2020      Reactions   Nuvigil [armodafinil] Hives   Penicillins Diarrhea, Nausea And Vomiting   Did it involve swelling of the face/tongue/throat, SOB, or low BP? no Did it involve sudden or severe rash/hives, skin peeling, or any reaction on the inside of your mouth or nose? No Did you need to seek medical attention at a hospital or doctor's office? No When did it last happen?in her 69s If all above answers are "NO", may proceed with cephalosporin use.   Gabapentin Other (See Comments)   "wiped her out, couldn't stay awake"   Provigil [modafinil] Hives      Medication List    TAKE these medications   acetaminophen 500 MG tablet Commonly known as: TYLENOL Take 2 tablets (1,000 mg total) by mouth every 8 (eight) hours as needed for moderate pain.   albuterol 1.25 MG/3ML nebulizer solution Commonly known as: ACCUNEB Take 3 mLs (1.25 mg total) by nebulization every 6 (six) hours as needed for wheezing.   benzonatate 100 MG capsule Commonly known as: TESSALON Take 1 capsule (100 mg total) by mouth 3 (three) times daily as needed for cough.   budesonide-formoterol 160-4.5 MCG/ACT inhaler Commonly known as: SYMBICORT Inhale 2 puffs into the lungs in the morning and at bedtime. What changed:   how much to take  how to take this  when to take this   cetirizine 10 MG tablet Commonly known as: ZYRTEC Take 1 tablet (10 mg total) by mouth daily. What changed: when to take this   cholestyramine 4 GM/DOSE powder Commonly known as: Lucrezia Starch  MIX 4 GM AND DRINK BY MOUTH DAILY AT BEDTIME.   cholestyramine 4 g packet Commonly known as: QUESTRAN Take 1 packet (4 g total) and drink by mouth at bedtime.   doxycycline 100 MG tablet Commonly known as: VIBRA-TABS Take 1 tablet (100 mg total) by mouth 2 (two) times daily for 4 days.   lisinopril 10 MG tablet Commonly known as: ZESTRIL TAKE 1 TABLET BY MOUTH DAILY.   multivitamin-iron-minerals-folic acid chewable  tablet Chew 1 tablet by mouth daily.   Oscal 500/200 D-3 500-200 MG-UNIT tablet Generic drug: calcium-vitamin D Take 1 tablet by mouth daily with breakfast.   pantoprazole 40 MG tablet Commonly known as: PROTONIX TAKE 1 TABLET BY MOUTH DAILY   predniSONE 10 MG tablet Commonly known as: DELTASONE Prednisone dosing: Take  Prednisone 40mg  (4 tabs) x 3 days, then taper to 30mg  (3 tabs) x 3 days, then 20mg  (2 tabs) x 3days, then 10mg  (1 tab) x 3days, then OFF. What changed:   medication strength  how much to take  how to take this  when to take this  additional instructions   promethazine 25 MG tablet Commonly known as: PHENERGAN TAKE 1 TABLET BY MOUTH EVERY 6 HOURS AS NEEDED FOR NAUSEA OR VOMITING.   rOPINIRole 1 MG tablet Commonly known as: REQUIP Take 1 tablet (1 mg total) by mouth at bedtime.   Spiriva Respimat 1.25 MCG/ACT Aers Generic drug: Tiotropium Bromide Monohydrate INHALE 2 PUFFS INTO THE LUNGS ONCE DAILY        Brief H and P: For complete details please refer to admission H and P, but in brief *Patient is a 70 year old female with history of asthma, OSA on CPAP, GERD, depression, vitamin B12 deficiency, hypertension, CKD, recent diagnosis of diastolic CHF presented with chest tightness and wheezing similar to prior asthma attacks.  Patient was noted to be hypoxic down to 89% on room air and was placed on O2.  Last admission in April 2022 with asthma exacerbation.  Patient reported that she was home only for 3 days and started to feel sick again.  She followed up with pulmonology, was given Z-Pak and prednisone.  Patient was admitted for acute hypoxic respiratory failure due to asthma exacerbation.  Hospital Course:  Acute respiratory failure with hypoxia (HCC) secondary to asthma exacerbation - Presented with shortness of breath, chest tightness and wheezing, improved at the time of discharge. -Initially placed on IV Solu-Medrol, now transitioned to oral  prednisone with taper. -Continue Symbicort, duo nebs, tiotropium, doxycycline, Tessalon Perles as needed -BNP 131.4, troponin 10.  EKG showed sinus tachycardia but no significant ST-T wave changes suggestive of ischemia.   -Recent echo in 08/2020 had shown normal EF of 55 to 60% with grade 1 DD -Had CTA chest during previous admission in 08/2020 had shown no PE, subtle patchy densities in right lower lobe and upper lobes could be air trapping versus atelectasis -Patient was seen by pulmonology during admission, currently improving, recommended continue current management, prednisone taper, arrange follow-up with pulmonology outpatient Dr. Mortimer Fries  Home O2 evaluation prior to discharge     OSA on CPAP -Continue CPAP qhs  Chronic diastolic CHF -IV fluids were discontinued on 5/4, BNP 131.4 -2D echo in 08/2020 had shown EF of 55 to 60% with G1 DD    Essential hypertension -BP currently stable, continue lisinopril  GERD -Continue PPI  AKI on CKD stage 3a -Baseline creatinine 1.3, presented with creatinine of 1.6 - Creatinine back to baseline, creatinine 1.3  Morbid  obesity Estimated body mass index is 55.65 kg/m as calculated from the following:   Height as of this encounter: 5\' 4"  (1.626 m).   Weight as of this encounter: 147.1 kg.   Day of Discharge S: Doing much better, no chest tightness or any worsening shortness of breath.  Hoping to go home today  BP (!) 172/71 (BP Location: Right Arm)   Pulse 89   Temp 97.8 F (36.6 C) (Oral)   Resp 18   Ht 5\' 4"  (1.626 m)   Wt (!) 146.7 kg   SpO2 94%   BMI 55.53 kg/m   Physical Exam: General: Alert and awake oriented x3 not in any acute distress. CVS: S1-S2 clear no murmur rubs or gallops Chest: clear to auscultation bilaterally, no wheezing rales or rhonchi Abdomen: soft nontender, nondistended, normal bowel sounds Extremities: no cyanosis, clubbing or edema noted bilaterally Neuro: no new deficits    Get Medicines  reviewed and adjusted: Please take all your medications with you for your next visit with your Primary MD  Please request your Primary MD to go over all hospital tests and procedure/radiological results at the follow up. Please ask your Primary MD to get all Hospital records sent to his/her office.  If you experience worsening of your admission symptoms, develop shortness of breath, life threatening emergency, suicidal or homicidal thoughts you must seek medical attention immediately by calling 911 or calling your MD immediately  if symptoms less severe.  You must read complete instructions/literature along with all the possible adverse reactions/side effects for all the Medicines you take and that have been prescribed to you. Take any new Medicines after you have completely understood and accept all the possible adverse reactions/side effects.   Do not drive when taking pain medications.   Do not take more than prescribed Pain, Sleep and Anxiety Medications  Special Instructions: If you have smoked or chewed Tobacco  in the last 2 yrs please stop smoking, stop any regular Alcohol  and or any Recreational drug use.  Wear Seat belts while driving.  Please note  You were cared for by a hospitalist during your hospital stay. Once you are discharged, your primary care physician will handle any further medical issues. Please note that NO REFILLS for any discharge medications will be authorized once you are discharged, as it is imperative that you return to your primary care physician (or establish a relationship with a primary care physician if you do not have one) for your aftercare needs so that they can reassess your need for medications and monitor your lab values.   The results of significant diagnostics from this hospitalization (including imaging, microbiology, ancillary and laboratory) are listed below for reference.      Procedures/Studies:  DG Chest 2 View  Result Date:  09/19/2020 CLINICAL DATA:  Shortness of breath EXAM: CHEST - 2 VIEW COMPARISON:  August 29, 2020 chest radiograph and chest CT FINDINGS: Lungs are clear. Heart size and pulmonary vascularity are normal. No adenopathy. No pneumothorax. No bone lesions. IMPRESSION: Lungs clear.  Cardiac silhouette normal. Electronically Signed   By: Lowella Grip III M.D.   On: 09/19/2020 17:19   DG Chest 2 View  Result Date: 08/29/2020 CLINICAL DATA:  Shortness of breath EXAM: CHEST - 2 VIEW COMPARISON:  02/10/2020 FINDINGS: Cardiac shadow is within normal limits. The lungs are well aerated bilaterally. Mild bronchitic changes are again identified and similar to that seen on the prior exam likely of a more chronic nature. No  focal infiltrate or effusion is seen. No bony abnormality is noted. IMPRESSION: Chronic bronchitic changes without acute abnormality. Electronically Signed   By: Inez Catalina M.D.   On: 08/29/2020 14:14   CT Angio Chest PE W and/or Wo Contrast  Result Date: 08/29/2020 CLINICAL DATA:  70 year old with shortness of breath and chest pain. EXAM: CT ANGIOGRAPHY CHEST WITH CONTRAST TECHNIQUE: Multidetector CT imaging of the chest was performed using the standard protocol during bolus administration of intravenous contrast. Multiplanar CT image reconstructions and MIPs were obtained to evaluate the vascular anatomy. CONTRAST:  108mL OMNIPAQUE IOHEXOL 350 MG/ML SOLN COMPARISON:  Chest CT 05/07/2019 FINDINGS: Cardiovascular: No evidence for filling defects in the main or lobar pulmonary arteries. Limited evaluation of the segmental pulmonary arteries and more distal branches. Heart size is within normal limits. No significant pericardial fluid. Atherosclerotic calcification involving thoracic aorta without aneurysm. Mediastinum/Nodes: No significant lymph node enlargement in the mediastinum or hilar regions. No axillary lymph node enlargement. Lungs/Pleura: Trachea and mainstem bronchi are patent. No large  pleural effusions. Patchy densities along the periphery of the right lower lobe on sequence 6 image 46 are new. Questionable tiny nodule in the periphery of the right upper lobe on sequence 6, image 18 is probably chronic. Ground-glass opacities in the right upper lobe are similar to the previous examination may be related to areas of air trapping. Subtle ground-glass opacities in left upper lobe are nonspecific. Upper Abdomen: Cholecystectomy. No acute abnormality in the visualized upper abdomen. Musculoskeletal: No acute bone abnormality. Review of the MIP images confirms the above findings. IMPRESSION: 1. Subtle patchy densities in the right lower lobe. Findings could represent a small area of infection in the right lower lobe. Additional ground-glass areas in the upper lobes which could be related air trapping versus atelectasis. 2. No evidence for a large or central pulmonary embolism. Study has technical limitations but there is no evidence for pulmonary embolism in the main or lobar pulmonary arteries. 3.  Aortic Atherosclerosis (ICD10-I70.0). Electronically Signed   By: Markus Daft M.D.   On: 08/29/2020 15:47   ECHOCARDIOGRAM COMPLETE  Result Date: 08/31/2020    ECHOCARDIOGRAM REPORT   Patient Name:   Samantha Mason Date of Exam: 08/31/2020 Medical Rec #:  [id number]        Height:       64.0 in Accession #:    [id number]       Weight:       320.0 lb Date of Birth:  February 23, 1951       BSA:          2.389 m Patient Age:    48 years         BP:           147/68 mmHg Patient Gender: F                HR:           107 bpm. Exam Location:  [facility] Procedure: 2D Echo, Cardiac Doppler and Color Doppler Indications:     Dyspnea R06.00  History:         Patient has no prior history of Echocardiogram examinations.                  COPD and Stroke; Risk Factors:Hypertension.  Sonographer:     Sherrie Sport RDCS (AE) Referring Phys:  [id number] Jennye Boroughs Diagnosing Phys: Ida Rogue MD  Sonographer Comments: Suboptimal  apical window and Technically challenging study due to limited  acoustic windows. IMPRESSIONS  1. Left ventricular ejection fraction, by estimation, is 55 to 60%. The left ventricle has normal function. The left ventricle has no regional wall motion abnormalities. Left ventricular diastolic parameters are consistent with Grade I diastolic dysfunction (impaired relaxation).  2. Right ventricular systolic function is normal. The right ventricular size is normal.  3. Challenging image quality. FINDINGS  Left Ventricle: Left ventricular ejection fraction, by estimation, is 55 to 60%. The left ventricle has normal function. The left ventricle has no regional wall motion abnormalities. The left ventricular internal cavity size was normal in size. There is  no left ventricular hypertrophy. Left ventricular diastolic parameters are consistent with Grade I diastolic dysfunction (impaired relaxation). Right Ventricle: The right ventricular size is normal. No increase in right ventricular wall thickness. Right ventricular systolic function is normal. Left Atrium: Left atrial size was normal in size. Right Atrium: Right atrial size was normal in size. Pericardium: There is no evidence of pericardial effusion. Mitral Valve: The mitral valve is normal in structure. No evidence of mitral valve regurgitation. No evidence of mitral valve stenosis. Tricuspid Valve: The tricuspid valve is normal in structure. Tricuspid valve regurgitation is not demonstrated. No evidence of tricuspid stenosis. Aortic Valve: The aortic valve was not well visualized. Aortic valve regurgitation is not visualized. No aortic stenosis is present. Aortic valve mean gradient measures 4.5 mmHg. Aortic valve peak gradient measures 8.1 mmHg. Aortic valve area, by VTI measures 2.10 cm. Pulmonic Valve: The pulmonic valve was normal in structure. Pulmonic valve regurgitation is not visualized. No evidence of pulmonic stenosis. Aorta: The aortic root is normal in  size and structure. Venous: The inferior vena cava is normal in size with greater than 50% respiratory variability, suggesting right atrial pressure of 3 mmHg. IAS/Shunts: No atrial level shunt detected by color flow Doppler.  LEFT VENTRICLE PLAX 2D LVIDd:         3.83 cm  Diastology LVIDs:         2.63 cm  LV e' medial:    5.55 cm/s LV PW:         1.33 cm  LV E/e' medial:  19.8 LV IVS:        1.04 cm  LV e' lateral:   6.85 cm/s LVOT diam:     2.00 cm  LV E/e' lateral: 16.1 LV SV:         61 LV SV Index:   25 LVOT Area:     3.14 cm  RIGHT VENTRICLE RV Basal diam:  3.33 cm RV S prime:     20.70 cm/s TAPSE (M-mode): 5.3 cm LEFT ATRIUM             Index       RIGHT ATRIUM           Index LA diam:        3.90 cm 1.63 cm/m  RA Area:     15.20 cm LA Vol (A2C):   51.9 ml 21.73 ml/m RA Volume:   33.90 ml  14.19 ml/m LA Vol (A4C):   47.9 ml 20.05 ml/m LA Biplane Vol: 49.9 ml 20.89 ml/m  AORTIC VALVE AV Area (Vmax):    2.26 cm AV Area (Vmean):   2.02 cm AV Area (VTI):     2.10 cm AV Vmax:           142.00 cm/s AV Vmean:          98.850 cm/s AV VTI:  0.289 m AV Peak Grad:      8.1 mmHg AV Mean Grad:      4.5 mmHg LVOT Vmax:         102.00 cm/s LVOT Vmean:        63.700 cm/s LVOT VTI:          0.193 m LVOT/AV VTI ratio: 0.67  AORTA Ao Root diam: 3.10 cm MITRAL VALVE                TRICUSPID VALVE MV Area (PHT): 6.96 cm     TR Peak grad:   11.7 mmHg MV Decel Time: 109 msec     TR Vmax:        171.00 cm/s MV E velocity: 110.00 cm/s MV A velocity: 144.00 cm/s  SHUNTS MV E/A ratio:  0.76         Systemic VTI:  0.19 m                             Systemic Diam: 2.00 cm Ida Rogue MD Electronically signed by Ida Rogue MD Signature Date/Time: 08/31/2020/5:29:21 PM    Final        LAB RESULTS: Basic Metabolic Panel: Recent Labs  Lab 09/19/20 1650 09/20/20 0408  NA 140 137  K 4.0 3.8  CL 104 101  CO2 26 24  GLUCOSE 133* 192*  BUN 30* 27*  CREATININE 1.62* 1.38*  CALCIUM 9.6 9.3  MG  --   1.9  PHOS  --  3.0   Liver Function Tests: Recent Labs  Lab 09/20/20 0408  AST 27  ALT 19  ALKPHOS 71  BILITOT 0.7  PROT 7.4  ALBUMIN 3.4*   No results for input(s): LIPASE, AMYLASE in the last 168 hours. No results for input(s): AMMONIA in the last 168 hours. CBC: Recent Labs  Lab 09/19/20 1650 09/20/20 0408  WBC 12.3* 11.3*  NEUTROABS  --  10.5*  HGB 10.9* 11.0*  HCT 35.3* 36.5  MCV 79.7* 81.3  PLT 312 300   Cardiac Enzymes: No results for input(s): CKTOTAL, CKMB, CKMBINDEX, TROPONINI in the last 168 hours. BNP: Invalid input(s): POCBNP CBG: No results for input(s): GLUCAP in the last 168 hours.     Disposition and Follow-up: Discharge Instructions    Ambulatory referral to Cardiology   Complete by: As directed    History of diastolic CHF, patient requesting to follow up with cardiology.   Diet - low sodium heart healthy   Complete by: As directed    Increase activity slowly   Complete by: As directed        DISPOSITION: [facility]    Glean Hess, MD. Schedule an appointment as soon as possible for a visit in 2 week(s).   Specialty: Internal Medicine Contact information: [address] [place] [address] [phone]        Flora Lipps, MD. Schedule an appointment as soon as possible for a visit in 2 week(s).   Specialties: Pulmonary Disease, Cardiology Contact information: [address] [place] [address] [phone]                Time coordinating discharge:  35-minutes  Signed:   Estill Cotta M.D. [facility] 09/22/2020, 11:58 AM

## 2020-09-22 NOTE — Progress Notes (Deleted)
Patient does not have a legal guardian  

## 2020-09-25 ENCOUNTER — Other Ambulatory Visit: Payer: Self-pay

## 2020-09-25 ENCOUNTER — Other Ambulatory Visit: Payer: Self-pay | Admitting: *Deleted

## 2020-09-25 DIAGNOSIS — G4733 Obstructive sleep apnea (adult) (pediatric): Secondary | ICD-10-CM | POA: Diagnosis not present

## 2020-09-25 DIAGNOSIS — J45901 Unspecified asthma with (acute) exacerbation: Secondary | ICD-10-CM | POA: Diagnosis not present

## 2020-09-25 DIAGNOSIS — I509 Heart failure, unspecified: Secondary | ICD-10-CM | POA: Diagnosis not present

## 2020-09-25 NOTE — Patient Outreach (Addendum)
[facility]) Care Management  09/25/2020  Samantha Mason [birth year] [id number]   Transition of care call/case closure   Referral received: 09/04/20 Initial outreach:09/04/20 Insurance: [employer]    Outreach Attempt #1 after readmission  Subjective: Initial successful telephone call to patient's preferred number in order to complete transition of care assessment; 2 HIPAA identifiers verified. Explained purpose of call and completed transition of care assessment.  Samantha Mason states that she is doing okay, feeling tired some shortness of breath with activity. She discussed having home oxygen for  use reports not wearing at present reports oxygen saturation reading 95% resting, encouraged use with ambulating as prescribed and keeping a record of readings.  She is speaking in complete sentences during call, occasional cough she reports on today and has taken tessalon. She discussed recent episode of hospital stays related to Asthma , report otherwise condition had been under control unsure of new changes to aggravate condition,. She discussed  MD noting some heart failure this time, she report monitoring her weights at home, no increase in swelling or weight gain reviewed worsening sign symptoms to notify MD office of sooner. Noted Cardiology referral placed.   Patient son is assisting with her recovery.   Reviewed accessing the following [place] Benefits : She discussed  ongoing health issues of Asthma , hypertension  and declines enrolling in  [place] chronic disease management programs at this time .  She does have the hospital indemnity plan and has made contact to file claim.  She uses  [facility] outpatient pharmacy at [employer].   Objective:   [facility]. Comorbidities include: Asthma, IBS, Hypertension , GERD Shewas discharged to home on  4/16/22without the need for home health servicesor DME. Patient with readmission at [facility] 5/3-09/22/20 for Severe persistent Asthma exacerbation. She was discharged home on 09/22/20 with home oxygen at 2 liters when ambulatory Assessment:  Patient voices good understanding of all discharge instructions.  See transition of care flowsheet for assessment details.   Plan:  Reviewed hospital discharge diagnosis of severe persistent asthma exacerbation   and discharge treatment plan using hospital discharge instructions, assessing medication adherence, reviewing problems requiring provider notification, and discussing the importance of follow up with  primary care provider and/or specialists as directed.  Reinforced attending follow up appointments with PCP, Pulmonary office and notifying sooner if worsening of conditions .   Reviewed [place] healthy lifestyle program information to receive discounted premium for  2023   Step 1: Get  your annual physical  Step 2: Complete your health assessment  Step 3:Identify your current health status and complete the corresponding action step between May 20, 2020 and January 18, 2021.     No ongoing care management needs identified so will close case to [place] Management services and route successful outreach letter with [place] Management pamphlet and 24 Hour Nurse Line Magnet to [place] [facility] clinical pool to be mailed to patient's home address, along with heart healthy diet information.  Thanked patient for their services to [facility].   Samantha Draft, RN, BSN  [place] Management Coordinator  [phone]- Mobile [phone]- Toll Free Main Office

## 2020-09-26 ENCOUNTER — Other Ambulatory Visit: Payer: Self-pay | Admitting: Internal Medicine

## 2020-09-26 ENCOUNTER — Other Ambulatory Visit: Payer: Self-pay

## 2020-09-26 DIAGNOSIS — K219 Gastro-esophageal reflux disease without esophagitis: Secondary | ICD-10-CM

## 2020-09-26 MED ORDER — PANTOPRAZOLE SODIUM 40 MG PO TBEC
DELAYED_RELEASE_TABLET | Freq: Every day | ORAL | 1 refills | Status: DC
  Filled 2020-09-26: qty 90, 90d supply, fill #0

## 2020-10-03 ENCOUNTER — Ambulatory Visit: Payer: 59 | Admitting: *Deleted

## 2020-10-05 ENCOUNTER — Other Ambulatory Visit: Payer: Self-pay

## 2020-10-05 ENCOUNTER — Other Ambulatory Visit
Admission: RE | Admit: 2020-10-05 | Discharge: 2020-10-05 | Disposition: A | Discharge status: Home / Self Care | Payer: 59 | Attending: Internal Medicine | Admitting: Internal Medicine

## 2020-10-05 ENCOUNTER — Ambulatory Visit: Payer: 59 | Admitting: Internal Medicine

## 2020-10-05 ENCOUNTER — Encounter: Payer: Self-pay | Admitting: Internal Medicine

## 2020-10-05 VITALS — BP 140/64 | HR 98 | Temp 99.6°F | Ht 64.0 in | Wt 322.0 lb

## 2020-10-05 DIAGNOSIS — I10 Essential (primary) hypertension: Secondary | ICD-10-CM | POA: Insufficient documentation

## 2020-10-05 DIAGNOSIS — E8770 Fluid overload, unspecified: Secondary | ICD-10-CM | POA: Insufficient documentation

## 2020-10-05 DIAGNOSIS — J4541 Moderate persistent asthma with (acute) exacerbation: Secondary | ICD-10-CM | POA: Diagnosis not present

## 2020-10-05 DIAGNOSIS — R0602 Shortness of breath: Secondary | ICD-10-CM | POA: Insufficient documentation

## 2020-10-05 DIAGNOSIS — G4733 Obstructive sleep apnea (adult) (pediatric): Secondary | ICD-10-CM | POA: Diagnosis not present

## 2020-10-05 LAB — CBC WITH DIFFERENTIAL/PLATELET
Abs Immature Granulocytes: 0.08 10*3/uL — ABNORMAL HIGH (ref 0.00–0.07)
Basophils Absolute: 0 10*3/uL (ref 0.0–0.1)
Basophils Relative: 0 %
Eosinophils Absolute: 0.1 10*3/uL (ref 0.0–0.5)
Eosinophils Relative: 1 %
HCT: 35.2 % — ABNORMAL LOW (ref 36.0–46.0)
Hemoglobin: 11 g/dL — ABNORMAL LOW (ref 12.0–15.0)
Immature Granulocytes: 1 %
Lymphocytes Relative: 17 %
Lymphs Abs: 2.3 10*3/uL (ref 0.7–4.0)
MCH: 24.8 pg — ABNORMAL LOW (ref 26.0–34.0)
MCHC: 31.3 g/dL (ref 30.0–36.0)
MCV: 79.5 fL — ABNORMAL LOW (ref 80.0–100.0)
Monocytes Absolute: 1.2 10*3/uL — ABNORMAL HIGH (ref 0.1–1.0)
Monocytes Relative: 8 %
Neutro Abs: 10.4 10*3/uL — ABNORMAL HIGH (ref 1.7–7.7)
Neutrophils Relative %: 73 %
Platelets: 277 10*3/uL (ref 150–400)
RBC: 4.43 MIL/uL (ref 3.87–5.11)
RDW: 16.6 % — ABNORMAL HIGH (ref 11.5–15.5)
WBC: 14.1 10*3/uL — ABNORMAL HIGH (ref 4.0–10.5)
nRBC: 0 % (ref 0.0–0.2)

## 2020-10-05 LAB — BASIC METABOLIC PANEL
Anion gap: 7 (ref 5–15)
BUN: 20 mg/dL (ref 8–23)
CO2: 30 mmol/L (ref 22–32)
Calcium: 9.1 mg/dL (ref 8.9–10.3)
Chloride: 98 mmol/L (ref 98–111)
Creatinine, Ser: 1.32 mg/dL — ABNORMAL HIGH (ref 0.44–1.00)
GFR, Estimated: 44 mL/min — ABNORMAL LOW (ref >60)
Glucose, Bld: 94 mg/dL (ref 70–99)
Potassium: 4.1 mmol/L (ref 3.5–5.1)
Sodium: 135 mmol/L (ref 135–145)

## 2020-10-05 LAB — BRAIN NATRIURETIC PEPTIDE: B Natriuretic Peptide: 15.7 pg/mL (ref 0.0–100.0)

## 2020-10-05 NOTE — Patient Instructions (Signed)
Wear your oxygen while sleeping  Look for a call from Cardiology

## 2020-10-05 NOTE — Progress Notes (Signed)
Date:  10/05/2020   Name:  Samantha Mason   DOB:  1950-12-18   MRN:  [id number]   Chief Complaint: Hospitalization Follow-up (Asthma, COPD, pt feels horrible hasn't felt good since she came home, very fatigue, cough, back of shoulder blades hurt due to difficulty breathing) Hospital follow up.  Admitted to [facility] 09/19/20 to 09/22/20.  TOC call on 09/25/20.   Hospital Course:  Acute respiratory failure with hypoxia (HCC) secondary to asthma exacerbation -Presented with shortness of breath, chest tightness and wheezing, improved at the time of discharge. -Initially placed on IV Solu-Medrol, now transitioned to oral prednisone with taper. -Continue Symbicort, duo nebs, tiotropium, doxycycline, Tessalon Perles as needed -BNP 131.4, troponin10. EKG showed sinus tachycardia but no significant ST-T wave changes suggestive of ischemia.  -Recent echo in 4/2022had shown normal EF of 55 to 60% with grade 1 DD -Had CTA chest during previous admission in 08/2020 had shown no PE, subtle patchy densities in right lower lobe and upper lobes could be air trapping versus atelectasis -Patient was seen by pulmonology during admission, currently improving, recommended continue current management, prednisone taper, arrange follow-up with pulmonology outpatient Dr. Mortimer Fries Home O2 evaluation prior to discharge.  >>> She has finished the steroid taper - still short of breath even at rest at times.  She is not using the O2 unless her sats are below 90.  Most of the time her sats are 94-95 but may drop down while sleeping.  She continues to use Symbicort and Spiriva, along with albuterol nebs about once a day.  Also has a MDI.  She is not scheduled to see Pulmonary for three more weeks.  Home Health: None  Equipment/Devices: Home O2 evaluation qualifies for at least 2 L O2 while ambulating.  SATURATION QUALIFICATIONS: (Thisnote is usedto comply with regulatory documentation for home oxygen) Patient Saturations  on Room Air at Rest =92% Patient Saturations on Room Air while Ambulating =88-91% Please briefly explain why patient needs home oxygen:Patient dropped as low as 88% on RA while ambulating around nurses' station.  OSA on CPAP -Continue CPAP qhs  Chronic diastolic CHF -IV fluids were discontinued on 5/4, BNP 131.4 -2D echo in 08/2020 had shown EF of 55 to 60% with G1 DD >>> she had an elevated BNP.  Was advised to monitor her weights daily and take HCTZ as needed.  Several days ago her weight jumped up 5 lbs so she took HCTZ and diuresed.  She had asked for cardiology referral which was done but they claimed they could not contact her and sent her a letter.  Essential hypertension -BP currently stable, continue lisinopril  GERD -Continue PPI  AKI on CKD stage 3a -Baseline creatinine 1.3, presented with creatinine of 1.6 -Creatinine back to baseline, creatinine 1.3  Morbid obesity Estimated body mass index is 55.65 kg/m as calculated from the following: Height as of this encounter: 5\' 4"  (1.626 m). Weight as of this encounter: 147.1 kg. HPI  Lab Results  Component Value Date   CREATININE 1.32 (H) 10/05/2020   BUN 20 10/05/2020   NA 135 10/05/2020   K 4.1 10/05/2020   CL 98 10/05/2020   CO2 30 10/05/2020   Lab Results  Component Value Date   CHOL 162 02/16/2020   HDL 64 02/16/2020   LDLCALC 83 02/16/2020   TRIG 75 02/16/2020   CHOLHDL 2.5 02/16/2020   Lab Results  Component Value Date   TSH 0.488 09/20/2020   Lab Results  Component Value  Date   HGBA1C 5.7 (H) 02/16/2020   Lab Results  Component Value Date   WBC 14.1 (H) 10/05/2020   HGB 11.0 (L) 10/05/2020   HCT 35.2 (L) 10/05/2020   MCV 79.5 (L) 10/05/2020   PLT 277 10/05/2020   Lab Results  Component Value Date   ALT 19 09/20/2020   AST 27 09/20/2020   ALKPHOS 71 09/20/2020   BILITOT 0.7 09/20/2020     Review of Systems  Constitutional: Positive for fatigue, fever and unexpected  weight change. Negative for chills.  HENT: Negative for trouble swallowing.   Respiratory: Positive for chest tightness and shortness of breath. Negative for wheezing.   Cardiovascular: Positive for leg swelling. Negative for chest pain and palpitations.  Gastrointestinal: Negative for abdominal pain, diarrhea and vomiting.  Neurological: Positive for light-headedness. Negative for dizziness and headaches.  Psychiatric/Behavioral: Negative for dysphoric mood and sleep disturbance. The patient is not nervous/anxious.     Patient Active Problem List   Diagnosis Date Noted  . Asthma in adult, moderate persistent, with acute exacerbation 08/29/2020  . Ovarian failure 02/21/2020  . Total knee replacement status 04/30/2019  . Primary osteoarthritis of right knee 02/13/2019  . S/P total knee arthroplasty, left 02/13/2019  . Aortic atherosclerosis ([place]) 08/01/2018  . B12 deficiency anemia 07/07/2018  . Benign neoplasm of ascending colon   . Positive colorectal cancer screening using Cologuard test 03/13/2018  . Severe persistent asthma 02/13/2018  . Vitamin D deficiency 03/17/2017  . Prediabetes 03/14/2017  . Microcytic anemia 03/14/2017  . IBS (irritable bowel syndrome) 03/12/2017  . Allergic rhinitis 03/12/2017  . Restless leg syndrome 03/12/2017  . Acid reflux 10/16/2016  . Essential hypertension 10/16/2016  . OSA on CPAP 12/08/2014  . BMI 50.0-59.9, adult ([place]) 05/31/2014    Allergies  Allergen Reactions  . Nuvigil [Armodafinil] Hives  . Penicillins Diarrhea and Nausea And Vomiting    Did it involve swelling of the face/tongue/throat, SOB, or low BP? no Did it involve sudden or severe rash/hives, skin peeling, or any reaction on the inside of your mouth or nose? No Did you need to seek medical attention at a hospital or doctor's office? No When did it last happen?in her 22s If all above answers are "NO", may proceed with cephalosporin use.   . Gabapentin Other (See  Comments)    "wiped her out, couldn't stay awake"  . Provigil [Modafinil] Hives    Past Surgical History:  Procedure Laterality Date  . CHOLECYSTECTOMY    . COLONOSCOPY WITH PROPOFOL N/A 03/31/2018   Procedure: COLONOSCOPY WITH PROPOFOL;  Surgeon: Lucilla Lame, MD;  Location: [facility] ENDOSCOPY;  Service: Endoscopy;  Laterality: N/A;  . KNEE ARTHROPLASTY Right 04/30/2019   Procedure: COMPUTER ASSISTED TOTAL KNEE ARTHROPLASTY;  Surgeon: Dereck Leep, MD;  Location: [facility] ORS;  Service: Orthopedics;  Laterality: Right;  . saliva gland removal  1998  . TOTAL KNEE ARTHROPLASTY Left 2014  . TUBAL LIGATION Bilateral     Social History   Tobacco Use  . Smoking status: Never Smoker  . Smokeless tobacco: Never Used  Vaping Use  . Vaping Use: Never used  Substance Use Topics  . Alcohol use: No    Alcohol/week: 0.0 standard drinks  . Drug use: No     Medication list has been reviewed and updated.  Current Meds  Medication Sig  . acetaminophen (TYLENOL) 500 MG tablet Take 2 tablets (1,000 mg total) by mouth every 8 (eight) hours as needed for moderate pain.  Marland Kitchen  albuterol (ACCUNEB) 1.25 MG/3ML nebulizer solution Take 3 mLs (1.25 mg total) by nebulization every 6 (six) hours as needed for wheezing.  . benzonatate (TESSALON) 100 MG capsule Take 1 capsule (100 mg total) by mouth 3 (three) times daily as needed for cough.  . budesonide-formoterol (SYMBICORT) 160-4.5 MCG/ACT inhaler Inhale 2 puffs into the lungs in the morning and at bedtime.  . calcium-vitamin D (OSCAL 500/200 D-3) 500-200 MG-UNIT tablet Take 1 tablet by mouth daily with breakfast.  . cetirizine (ZYRTEC) 10 MG tablet Take 1 tablet (10 mg total) by mouth daily. (Patient taking differently: Take 10 mg by mouth every morning.)  . cholestyramine (QUESTRAN) 4 g packet Take 1 packet (4 g total) and drink by mouth at bedtime.  . cholestyramine (QUESTRAN) 4 GM/DOSE powder MIX 4 GM AND DRINK BY MOUTH DAILY AT BEDTIME.  Marland Kitchen lisinopril  (ZESTRIL) 10 MG tablet TAKE 1 TABLET BY MOUTH DAILY.  . multivitamin-iron-minerals-folic acid (CENTRUM) chewable tablet Chew 1 tablet by mouth daily.  . pantoprazole (PROTONIX) 40 MG tablet TAKE 1 TABLET BY MOUTH DAILY  . promethazine (PHENERGAN) 25 MG tablet TAKE 1 TABLET BY MOUTH EVERY 6 HOURS AS NEEDED FOR NAUSEA OR VOMITING.  . rOPINIRole (REQUIP) 1 MG tablet Take 1 tablet (1 mg total) by mouth at bedtime. (Patient taking differently: Take 1 mg by mouth 3 (three) times daily.)  . Tiotropium Bromide Monohydrate 1.25 MCG/ACT AERS INHALE 2 PUFFS INTO THE LUNGS ONCE DAILY    PHQ 2/9 Scores 10/05/2020 02/16/2020 02/15/2019 01/13/2019  PHQ - 2 Score 0 0 0 0  PHQ- 9 Score 3 0 - -    GAD 7 : Generalized Anxiety Score 10/05/2020 02/16/2020  Nervous, Anxious, on Edge 0 0  Control/stop worrying 0 0  Worry too much - different things 0 0  Trouble relaxing 0 0  Restless 0 0  Easily annoyed or irritable 0 0  Afraid - awful might happen 0 0  Total GAD 7 Score 0 0  Anxiety Difficulty - Not difficult at all    BP Readings from Last 3 Encounters:  10/05/20 140/64  09/22/20 (!) 175/73  09/02/20 (!) 150/62    Physical Exam Constitutional:      Appearance: She is obese. She is ill-appearing.  Neck:     Vascular: No carotid bruit.  Cardiovascular:     Rate and Rhythm: Regular rhythm. Tachycardia present.     Heart sounds: No murmur heard.   Pulmonary:     Effort: Pulmonary effort is normal. No respiratory distress.     Breath sounds: No wheezing or rhonchi.  Musculoskeletal:     Cervical back: Normal range of motion.     Right lower leg: No edema.     Left lower leg: No edema.  Lymphadenopathy:     Cervical: No cervical adenopathy.  Skin:    General: Skin is warm and dry.  Neurological:     General: No focal deficit present.     Mental Status: She is alert.     Wt Readings from Last 3 Encounters:  10/05/20 (!) 322 lb (146.1 kg)  09/22/20 (!) 323 lb 8 oz (146.7 kg)  08/29/20 (!)  320 lb (145.2 kg)    BP 140/64   Pulse 98   Temp 99.6 F (37.6 C) (Oral)   Ht 5\' 4"  (1.626 m)   Wt (!) 322 lb (146.1 kg)   SpO2 95%   BMI 55.27 kg/m   Assessment and Plan: 1. Asthma in adult, moderate persistent,  with acute exacerbation Clinically she is improved but with persistent SOB. Recommend wearing O2 while sleeping and PRN during the day Continue current regimen; no indication for additional steroids but do agree with Cardiology consultation - CBC with Differential/Platelet - Basic metabolic panel  2. Essential hypertension Clinically stable exam with well controlled BP. Tolerating medications without side effects at this time. Pt to continue current regimen and low sodium diet; benefits of regular exercise as able discussed.  3. Hypervolemia, unspecified hypervolemia type Recent volume status changes at home. Continue daily weights, HCTZ if needed - Ambulatory referral to Cardiology - B Nat Peptide  4. Shortness of breath - Ambulatory referral to Cardiology - B Nat Peptide   Partially dictated using [place]. Any errors are unintentional.  Halina Maidens, MD [facility]  10/05/2020

## 2020-10-17 NOTE — Progress Notes (Signed)
@Patient  ID: Samantha Mason, female    DOB: Oct 27, 1950, 70 y.o.   MRN: [id number]  Chief Complaint  Patient presents with  . Follow-up    OSA, ASTHMA, sob     Referring provider: Glean Hess, MD   SYNOPSIS The patient is a 70 year old female she has a history of asthma admitted to the hospital from 3/6 to 3/8 for COPD exacerbation, treated with IV steroids and duo nebs, she was initially placed on BiPAP. She had previously seen Dr. Stevenson Clinch for asthma in 2016, noted to have triggers of pollen.  HPI: 70 year old female, never smoked.  Past medical history significant for moderate persistent asthma, OSA, RLS. Patient of Dr. Mortimer Fries, last seen in office on 03/24/2020. She is on Symbicort, Requip and Nuvigil prn. She has 2 dogs at home, 1 sleeps in bed with her   Previous [facility] pulmonary encounter: 03/24/21- Dr. Mortimer Fries Follow-up asthma Asthma seems to be well-controlled at this time No signs of exacerbation No indication for steroids at this time  No indication for antibiotics No evidence of heart failure at this time No evidence or signs of infection at this time  Follow-up OSA Patient uses CPAP at night however she needs to get new machine Compliance report not available She takes Requip for restless leg syndrome  Progressive shortness of breath and increased work of breathing over the last several months Patient complains of orthopnea Intermittent leg swelling Recommend cardiology referral for evaluation for cardiac dysfunction   10/18/2020- Interim hx  Patient presents today for 13-month follow-up. During last visit she reported increased dyspnea symptoms with associated orthopnea and leg swelling. She was referred to cardiology in November 2022 for evaluation for cardiac dysfunction. Since last visit she was in the hospital in April and May for acute respiratory failure secondary to asthma exacerbation and congestive heart failure. She gets out of breath just sitting and/or  with minimal exertion. She has some associated chest tightness and occasional cough. Cough is dry, non-productive. She is compliant with Symbicort 160 and Spiriva 1.70mcg with little improvement. She tells me that she has been started on HCTZ but this is not on medication list or discharge summary. She has not yet seen cardiology in clinic yet, she has an apt in June with them. She has been 100% compliant with use since receiving new CPAP machine on May 19th.   Airview download 10/05/20-10/16/20 12/12 days (100%); 10 days > 4 hours Average usage days used  Pressure 5-15cm h20 (11.6cm h20- 95%) Airleaks 39.8L/min AHI 6.4    Imaging:  **CT chest 07/02/2018>> images personally reviewed, lung volumes are reduced consistent with restrictive lung disease from body habitus **Chest x-ray 07/24/2018>>  increased lung markings suggestive of chronic bronchitis, hyperinflation suggestive of emphysema.  Allergies  Allergen Reactions  . Nuvigil [Armodafinil] Hives  . Penicillins Diarrhea and Nausea And Vomiting    Did it involve swelling of the face/tongue/throat, SOB, or low BP? no Did it involve sudden or severe rash/hives, skin peeling, or any reaction on the inside of your mouth or nose? No Did you need to seek medical attention at a hospital or doctor's office? No When did it last happen?in her 50s If all above answers are "NO", may proceed with cephalosporin use.   . Gabapentin Other (See Comments)    "wiped her out, couldn't stay awake"  . Provigil [Modafinil] Hives    Immunization History  Administered Date(s) Administered  . Fluad Quad(high Dose 65+) 02/15/2019, 02/16/2020  . Influenza Split  02/23/2018  . Influenza-Unspecified 02/16/2014, 01/18/2017  . Moderna Sars-Covid-2 Vaccination 01/05/2020, 02/01/2020  . Pneumococcal Conjugate-13 05/16/2014  . Pneumococcal Polysaccharide-23 03/12/2017, 07/16/2017    Past Medical History:  Diagnosis Date  . Acute bronchitis   . Anemia   .  Anxiety and depression   . Arthritis   . Asthma   . B12 deficiency 03/14/2017  . Chest pain 07/02/2018  . COPD (chronic obstructive pulmonary disease) ([place]) 07/24/2018  . Family history of blood clots 07/16/2017  . Irritable bowel syndrome   . Malignant hypertension   . Obesity   . OSA (obstructive sleep apnea)    uses cpap  . Pneumonia 2017  . Restless leg syndrome   . Stroke [facility]) 1995   mild    Tobacco History: Social History   Tobacco Use  Smoking Status Never Smoker  Smokeless Tobacco Never Used   Counseling given: Not Answered   Outpatient Medications Prior to Visit  Medication Sig Dispense Refill  . acetaminophen (TYLENOL) 500 MG tablet Take 2 tablets (1,000 mg total) by mouth every 8 (eight) hours as needed for moderate pain. 30 tablet 0  . albuterol (ACCUNEB) 1.25 MG/3ML nebulizer solution Take 3 mLs (1.25 mg total) by nebulization every 6 (six) hours as needed for wheezing. 75 mL 12  . benzonatate (TESSALON) 100 MG capsule Take 1 capsule (100 mg total) by mouth 3 (three) times daily as needed for cough. 30 capsule 0  . budesonide-formoterol (SYMBICORT) 160-4.5 MCG/ACT inhaler Inhale 2 puffs into the lungs in the morning and at bedtime. 10.2 g 3  . calcium-vitamin D (OSCAL 500/200 D-3) 500-200 MG-UNIT tablet Take 1 tablet by mouth daily with breakfast.    . cetirizine (ZYRTEC) 10 MG tablet Take 1 tablet (10 mg total) by mouth daily. (Patient taking differently: Take 10 mg by mouth every morning.) 30 tablet 2  . cholestyramine (QUESTRAN) 4 g packet Take 1 packet (4 g total) and drink by mouth at bedtime. 60 each 2  . cholestyramine (QUESTRAN) 4 GM/DOSE powder MIX 4 GM AND DRINK BY MOUTH DAILY AT BEDTIME. 378 g 5  . lisinopril (ZESTRIL) 10 MG tablet TAKE 1 TABLET BY MOUTH DAILY. 90 tablet 3  . multivitamin-iron-minerals-folic acid (CENTRUM) chewable tablet Chew 1 tablet by mouth daily.    . pantoprazole (PROTONIX) 40 MG tablet TAKE 1 TABLET BY MOUTH DAILY 90 tablet 1  .  promethazine (PHENERGAN) 25 MG tablet TAKE 1 TABLET BY MOUTH EVERY 6 HOURS AS NEEDED FOR NAUSEA OR VOMITING. 30 tablet 0  . rOPINIRole (REQUIP) 1 MG tablet Take 1 tablet (1 mg total) by mouth at bedtime. (Patient taking differently: Take 1 mg by mouth 3 (three) times daily.) 30 tablet 3  . Tiotropium Bromide Monohydrate 1.25 MCG/ACT AERS INHALE 2 PUFFS INTO THE LUNGS ONCE DAILY 4 g 6   No facility-administered medications prior to visit.    Review of Systems  Review of Systems  Constitutional: Negative.   Respiratory: Positive for cough, chest tightness and shortness of breath. Negative for wheezing.   Cardiovascular: Positive for leg swelling.  Psychiatric/Behavioral: Negative.     Physical Exam  BP 130/62 (BP Location: Left Arm, Patient Position: Sitting, Cuff Size: Normal)   Pulse (!) 104   Temp (!) 97 F (36.1 C) (Temporal)   Ht 5\' 4"  (1.626 m)   Wt (!) 329 lb 12.8 oz (149.6 kg)   SpO2 94%   BMI 56.61 kg/m  Physical Exam Constitutional:      Appearance: Normal appearance.  HENT:     Head: Normocephalic and atraumatic.  Cardiovascular:     Rate and Rhythm: Normal rate and regular rhythm.     Comments: +1 BLE edema  Pulmonary:     Effort: Pulmonary effort is normal.     Breath sounds: Normal breath sounds. No wheezing, rhonchi or rales.  Skin:    General: Skin is warm and dry.  Neurological:     General: No focal deficit present.     Mental Status: She is alert and oriented to person, place, and time. Mental status is at baseline.  Psychiatric:        Mood and Affect: Mood normal.        Behavior: Behavior normal.        Thought Content: Thought content normal.        Judgment: Judgment normal.      Lab Results:  CBC    Component Value Date/Time   WBC 14.1 (H) 10/05/2020 1152   RBC 4.43 10/05/2020 1152   HGB 11.0 (L) 10/05/2020 1152   HGB 10.2 (L) 04/20/2014 1610   HCT 35.2 (L) 10/05/2020 1152   HCT 33.3 (L) 04/20/2014 1610   PLT 277 10/05/2020 1152    PLT 267 04/20/2014 1610   MCV 79.5 (L) 10/05/2020 1152   MCV 76 (L) 04/20/2014 1610   MCH 24.8 (L) 10/05/2020 1152   MCHC 31.3 10/05/2020 1152   RDW 16.6 (H) 10/05/2020 1152   RDW 17.4 (H) 04/20/2014 1610   LYMPHSABS 2.3 10/05/2020 1152   LYMPHSABS 1.6 04/20/2014 1610   MONOABS 1.2 (H) 10/05/2020 1152   MONOABS 0.4 04/20/2014 1610   EOSABS 0.1 10/05/2020 1152   EOSABS 0.2 04/20/2014 1610   BASOSABS 0.0 10/05/2020 1152   BASOSABS 0.0 04/20/2014 1610    BMET    Component Value Date/Time   NA 135 10/05/2020 1152   NA 141 05/25/2014 1407   K 4.1 10/05/2020 1152   K 3.6 05/25/2014 1407   CL 98 10/05/2020 1152   CL 106 05/25/2014 1407   CO2 30 10/05/2020 1152   CO2 28 05/25/2014 1407   GLUCOSE 94 10/05/2020 1152   GLUCOSE 86 05/25/2014 1407   BUN 20 10/05/2020 1152   BUN 15 05/25/2014 1407   CREATININE 1.32 (H) 10/05/2020 1152   CREATININE 1.24 05/25/2014 1407   CALCIUM 9.1 10/05/2020 1152   CALCIUM 8.6 05/25/2014 1407   GFRNONAA 44 (L) 10/05/2020 1152   GFRNONAA 47 (L) 05/25/2014 1407   GFRNONAA 47 (L) 09/26/2013 0325   GFRAA 52 (L) 02/16/2020 1154   GFRAA 56 (L) 05/25/2014 1407   GFRAA 54 (L) 09/26/2013 0325    BNP    Component Value Date/Time   BNP 15.7 10/05/2020 1152    ProBNP No results found for: PROBNP  Imaging: DG Chest 2 View  Result Date: 09/19/2020 CLINICAL DATA:  Shortness of breath EXAM: CHEST - 2 VIEW COMPARISON:  August 29, 2020 chest radiograph and chest CT FINDINGS: Lungs are clear. Heart size and pulmonary vascularity are normal. No adenopathy. No pneumothorax. No bone lesions. IMPRESSION: Lungs clear.  Cardiac silhouette normal. Electronically Signed   By: Lowella Grip III M.D.   On: 09/19/2020 17:19     Assessment & Plan:   OSA on CPAP - Received new CPAP machine on 10/05/20. She is 100% compliant with use. - Pressure 5-15cm h20; AHI 6.4 - No changes today - FU in 1 month with Dr. Mortimer Fries   Severe persistent asthma - Hospitalized in  April and May 2022 for acute respiratory failure secondary to acute asthma exacerbation and CHF. Trial Trelegy 100 x 2 weeks (hold Symb/Spiriva). Starting Singulair 10mg  qhs. Checking resp allergy panel, CBC with diff and Alpha-1 phenotype. Needs first available apt with Dr. Mortimer Fries which is in mid-July.   CHF (congestive heart failure) (HCC) - Echocardiogram in April 2022 showed EF 55%, grade 1 DD - Patient has apt with Dr. Birder Robson on June 30th    Martyn Ehrich, NP 10/18/2020

## 2020-10-18 ENCOUNTER — Encounter: Payer: Self-pay | Admitting: Primary Care

## 2020-10-18 ENCOUNTER — Other Ambulatory Visit: Payer: Self-pay

## 2020-10-18 ENCOUNTER — Ambulatory Visit (INDEPENDENT_AMBULATORY_CARE_PROVIDER_SITE_OTHER): Payer: 59 | Admitting: Primary Care

## 2020-10-18 VITALS — BP 130/62 | HR 104 | Temp 97.0°F | Ht 64.0 in | Wt 329.8 lb

## 2020-10-18 DIAGNOSIS — J4541 Moderate persistent asthma with (acute) exacerbation: Secondary | ICD-10-CM | POA: Diagnosis not present

## 2020-10-18 DIAGNOSIS — I509 Heart failure, unspecified: Secondary | ICD-10-CM | POA: Diagnosis not present

## 2020-10-18 DIAGNOSIS — J449 Chronic obstructive pulmonary disease, unspecified: Secondary | ICD-10-CM

## 2020-10-18 DIAGNOSIS — G4733 Obstructive sleep apnea (adult) (pediatric): Secondary | ICD-10-CM | POA: Diagnosis not present

## 2020-10-18 DIAGNOSIS — J4550 Severe persistent asthma, uncomplicated: Secondary | ICD-10-CM | POA: Diagnosis not present

## 2020-10-18 DIAGNOSIS — Z9989 Dependence on other enabling machines and devices: Secondary | ICD-10-CM

## 2020-10-18 MED ORDER — MONTELUKAST SODIUM 10 MG PO TABS
10.0000 mg | ORAL_TABLET | Freq: Every day | ORAL | 11 refills | Status: DC
  Filled 2020-10-18: qty 30, 30d supply, fill #0

## 2020-10-18 MED ORDER — TRELEGY ELLIPTA 100-62.5-25 MCG/INH IN AEPB: 100.0000 ug | INHALATION_SPRAY | Freq: Every day | RESPIRATORY_TRACT | 0 refills | Status: DC

## 2020-10-18 NOTE — Patient Instructions (Addendum)
COPD: - Start Singulair 10mg  daily at bedtime  - Stop Symbicort/Spiriva while TRYING sample Trelegy. - Start Trelegy 100, take one puff daily in am (rinse mouth after use). Check and see if insurance will covered Trelegy or [employer]. Notify us if you notice improvement in breathing on trelegy and you would like prescription sent into pharmacy for Trelegy    OSA: - CPAP download shows 1005 compliance with use. No changes today  - Aim to wear CPAP every night for 4-6 hours or longer - Do not drive if excessively tired   Orders: - Labs (CBC with diff, resp allergy profile, Alpha-1)  Follow-up: - Keep apt with cardiology in June as scheduled  - Needs visit on July 12 or 13th with Dr. Mortimer Fries    Montelukast Tablets What is this medicine? MONTELUKAST (mon te LOO kast) is used to prevent and treat the symptoms of asthma. It is also used to treat allergies. Do not use for an acute asthma attack. This medicine may be used for other purposes; ask your health care provider or pharmacist if you have questions. COMMON BRAND NAME(S): Singulair What should I tell my health care provider before I take this medicine? They need to know if you have any of these conditions:  liver disease  an unusual or allergic reaction to montelukast, other medicines, foods, dyes, or preservatives  pregnant or trying to get pregnant  breast-feeding How should I use this medicine? This medicine should be given by mouth. Follow the directions on the prescription label. Take this medicine at the same time every day. You may take this medicine with or without meals. Do not chew the tablets. Do not stop taking your medicine unless your doctor tells you to. Talk to your pediatrician regarding the use of this medicine in children. Special care may be needed. While this drug may be prescribed for children as young as 61 years of age for selected conditions, precautions do apply. Overdosage: If you think you have  taken too much of this medicine contact a poison control center or emergency room at once. NOTE: This medicine is only for you. Do not share this medicine with others. What if I miss a dose? If you miss a dose, skip it. Take your next dose at the normal time. Do not take extra or 2 doses at the same time to make up for the missed dose. What may interact with this medicine?  anti-infectives like rifampin and rifabutin  medicines for seizures like phenytoin, phenobarbital, and carbamazepine This list may not describe all possible interactions. Give your health care provider a list of all the medicines, herbs, non-prescription drugs, or dietary supplements you use. Also tell them if you smoke, drink alcohol, or use illegal drugs. Some items may interact with your medicine. What should I watch for while using this medicine? Visit your doctor or health care professional for regular checks on your progress. Tell your doctor or health care professional if your allergy or asthma symptoms do not improve. Take your medicine even when you do not have symptoms. Do not stop taking any of your medicine(s) unless your doctor tells you to. If you have asthma, talk to your doctor about what to do in an acute asthma attack. Always have your inhaled rescue medicine for asthma attacks with you. Patients and their families should watch for new or worsening thoughts of suicide or depression. Also watch for sudden changes in feelings such as feeling anxious, agitated, panicky, irritable, hostile, aggressive,  impulsive, severely restless, overly excited and hyperactive, or not being able to sleep. Any worsening of mood or thoughts of suicide or dying should be reported to your health care professional right away. What side effects may I notice from receiving this medicine? Side effects that you should report to your doctor or health care professional as soon as possible:  allergic reactions like skin rash or hives, or  swelling of the face, lips, or tongue  breathing problems  changes in emotions or moods  confusion  depressed mood  fever or infection  hallucinations  joint pain  painful lumps under the skin  pain, tingling, numbness in the hands or feet  redness, blistering, peeling, or loosening of the skin, including inside the mouth  restlessness  seizures  sleep walking  signs and symptoms of infection like fever; chills; cough; sore throat; flu-like illness  signs and symptoms of liver injury like dark yellow or brown urine; general ill feeling or flu-like symptoms; light-colored stools; loss of appetite; nausea; right upper belly pain; unusually weak or tired; yellowing of the eyes or skin  sinus pain or swelling  stuttering  suicidal thoughts or other mood changes  tremors  trouble sleeping  uncontrolled muscle movements  unusual bleeding or bruising  vivid or bad dreams Side effects that usually do not require medical attention (report to your doctor or health care professional if they continue or are bothersome):  dizziness  drowsiness  headache  runny nose  stomach upset  tiredness This list may not describe all possible side effects. Call your doctor for medical advice about side effects. You may report side effects to FDA at 1-800-FDA-1088. Where should I keep my medicine? Keep out of the reach of children. Store at room temperature between 15 and 30 degrees C (59 and 86 degrees F). Protect from light and moisture. Keep this medicine in the original bottle. Throw away any unused medicine after the expiration date. NOTE: This sheet is a summary. It may not cover all possible information. If you have questions about this medicine, talk to your doctor, pharmacist, or health care provider.  2021 Elsevier/Gold Standard (2020-04-01 15:22:47)

## 2020-10-18 NOTE — Assessment & Plan Note (Addendum)
-   Echocardiogram in April 2022 showed EF 55%, grade 1 DD - Patient has apt with Dr. Birder Robson on June 30th

## 2020-10-18 NOTE — Assessment & Plan Note (Addendum)
-   Hospitalized in April and May 2022 for acute respiratory failure secondary to acute asthma exacerbation and CHF. Trial Trelegy 100 x 2 weeks (hold Symb/Spiriva). Starting Singulair 10mg  qhs. Checking resp allergy panel, CBC with diff and Alpha-1 phenotype. Needs first available apt with Dr. Mortimer Fries which is in mid-July.

## 2020-10-18 NOTE — Assessment & Plan Note (Signed)
-   Received new CPAP machine on 10/05/20. She is 100% compliant with use. - Pressure 5-15cm h20; AHI 6.4 - No changes today - FU in 1 month with Dr. Mortimer Fries

## 2020-10-31 ENCOUNTER — Other Ambulatory Visit: Payer: Self-pay

## 2020-11-06 ENCOUNTER — Other Ambulatory Visit: Payer: Self-pay

## 2020-11-16 ENCOUNTER — Ambulatory Visit (INDEPENDENT_AMBULATORY_CARE_PROVIDER_SITE_OTHER): Payer: Medicare Other | Admitting: Internal Medicine

## 2020-11-16 ENCOUNTER — Other Ambulatory Visit: Payer: Self-pay

## 2020-11-16 ENCOUNTER — Encounter: Payer: Self-pay | Admitting: Internal Medicine

## 2020-11-16 VITALS — BP 140/62 | HR 113 | Ht 64.0 in | Wt 327.0 lb

## 2020-11-16 DIAGNOSIS — R0602 Shortness of breath: Secondary | ICD-10-CM

## 2020-11-16 DIAGNOSIS — I10 Essential (primary) hypertension: Secondary | ICD-10-CM

## 2020-11-16 DIAGNOSIS — Z8673 Personal history of transient ischemic attack (TIA), and cerebral infarction without residual deficits: Secondary | ICD-10-CM

## 2020-11-16 DIAGNOSIS — I5032 Chronic diastolic (congestive) heart failure: Secondary | ICD-10-CM

## 2020-11-16 DIAGNOSIS — I509 Heart failure, unspecified: Secondary | ICD-10-CM | POA: Diagnosis not present

## 2020-11-16 MED ORDER — FUROSEMIDE 40 MG PO TABS
40.0000 mg | ORAL_TABLET | Freq: Every day | ORAL | 1 refills | Status: DC
  Filled 2020-11-16: qty 30, 30d supply, fill #0

## 2020-11-16 MED ORDER — DILTIAZEM HCL ER COATED BEADS 180 MG PO CP24
180.0000 mg | ORAL_CAPSULE | Freq: Every day | ORAL | 1 refills | Status: DC
  Filled 2020-11-16: qty 30, 30d supply, fill #0

## 2020-11-16 NOTE — Patient Instructions (Signed)
Medication Instructions:   Your physician has recommended you make the following change in your medication:   START Furosemide (Lasix) 40mg  daily  START Diltiazem (extended release) 180mg  daily   *If you need a refill on your cardiac medications before your next appointment, please call your pharmacy*   Lab Work:  BMET today  If you have labs (blood work) drawn today and your tests are completely normal, you will receive your results only by: [place] (if you have MyChart) OR A paper copy in the mail If you have any lab test that is abnormal or we need to change your treatment, we will call you to review the results.   Testing/Procedures:  None ordered   Follow-Up: At [facility], you and your health needs are our priority.  As part of our continuing mission to provide you with exceptional heart care, we have created designated Provider Care Teams.  These Care Teams include your primary Cardiologist (physician) and Advanced Practice Providers (APPs -  Physician Assistants and Nurse Practitioners) who all work together to provide you with the care you need, when you need it.  We recommend signing up for the patient portal called "MyChart".  Sign up information is provided on this After Visit Summary.  MyChart is used to connect with patients for Virtual Visits (Telemedicine).  Patients are able to view lab/test results, encounter notes, upcoming appointments, etc.  Non-urgent messages can be sent to your provider as well.   To learn more about what you can do with MyChart, go to [email or web address].    Your next appointment:   1 month(s)  The format for your next appointment:   In Person  Provider:   You may see Dr. Harrell Gave End or one of the following Advanced Practice Providers on your designated Care Team:   Murray Hodgkins, NP Christell Faith, PA-C Marrianne Mood, PA-C Cadence Davie, [place] Laurann Montana, NP

## 2020-11-16 NOTE — Progress Notes (Signed)
New Outpatient Visit Date: 11/16/2020  Referring Provider: Glean Hess, MD [address] [place],  [place] [address]  Chief Complaint: Shortness of breath and diastolic dysfunction  HPI:  Ms. Kandler is a 69 y.o. female who is being seen today for the evaluation of shortness of breath and volume overload at the request of Dr. Army Melia. She has a history of chronic HFpEF, asthma, stroke (1995), hypertension, morbid obesity, and obstructive sleep apnea.  She was hospitalized last month with acute respiratory failure thought to be due to asthma exacerbation and a component of acute on chronic HFpEF.  She was seen once in our office in 2015 by Dr. Fletcher Anon for evaluation of shortness of breath and abnormal EKG.  She was also evaluated during a hospitalization in 06/2018 by Dr. Rockey Situ due to atypical chest pain.  Myocardial perfusion stress test at that time was low risk without ischemia.  Today, Ms. Olvera reports that she is feeling relatively well though she still has exertional dyspnea and occasional pain in her upper back and right arm at times.  She was hospitalized twice in the last few months, most recently in early May, with acute respiratory failure thought to be due to a combination of asthma and heart failure.  She continues to get short of breath when doing very light activity such as rolling in bed.  Her dyspnea seem to have gotten worse in late February.  She denies orthopnea and is compliant with her CPAP.  She has noticed occasional palpitations over the last month, typically lasting a few seconds at a time.  She denies associated symptoms.  Ms. Newlon reports waxing and waning edema of her ankles, which is dependent.  She has intermittently used hydrochlorothiazide samples to help with this, though she does not take this on a consistent basis.  --------------------------------------------------------------------------------------------------  Cardiovascular History &  Procedures: Cardiovascular Problems: Shortness of breath with history of HFpEF  Risk Factors: Hypertension, morbid obesity, and age greater than 91  Cath/PCI: None  CV Surgery: None  EP Procedures and Devices: None  Non-Invasive Evaluation(s): TTE (08/31/2020): Normal LV size and wall thickness.  LVEF 55-60% with grade 1 diastolic dysfunction.  Normal RV size and function.  Normal biatrial size.  No significant valvular abnormality.  Normal CVP. Pharmacologic MPI (07/02/2018): Low risk study without evidence of ischemia or scar.  LVEF 66%.  Recent CV Pertinent Labs: Lab Results  Component Value Date   CHOL 162 02/16/2020   HDL 64 02/16/2020   LDLCALC 83 02/16/2020   TRIG 75 02/16/2020   CHOLHDL 2.5 02/16/2020   INR 1.1 04/19/2019   INR 1.0 04/27/2012   BNP 15.7 10/05/2020   K 4.1 10/05/2020   K 3.6 05/25/2014   MG 1.9 09/20/2020   BUN 20 10/05/2020   BUN 15 05/25/2014   CREATININE 1.32 (H) 10/05/2020   CREATININE 1.24 05/25/2014    --------------------------------------------------------------------------------------------------  Past Medical History:  Diagnosis Date   Acute bronchitis    Anemia    Anxiety and depression    Arthritis    Asthma    B12 deficiency 03/14/2017   Chest pain 07/02/2018   COPD (chronic obstructive pulmonary disease) ([place]) 07/24/2018   Family history of blood clots 07/16/2017   Irritable bowel syndrome    Malignant hypertension    Obesity    OSA (obstructive sleep apnea)    uses cpap   Pneumonia 2017   Restless leg syndrome    Stroke ([place]) 1995   mild  Past Surgical History:  Procedure Laterality Date   CHOLECYSTECTOMY     COLONOSCOPY WITH PROPOFOL N/A 03/31/2018   Procedure: COLONOSCOPY WITH PROPOFOL;  Surgeon: Lucilla Lame, MD;  Location[facility] ENDOSCOPY;  Service: Endoscopy;  Laterality: N/A;   KNEE ARTHROPLASTY Right 04/30/2019   Procedure: COMPUTER ASSISTED TOTAL KNEE ARTHROPLASTY;  Surgeon: Dereck Leep, MD;  Location:  [facility] ORS;  Service: Orthopedics;  Laterality: Right;   saliva gland removal  1998   TOTAL KNEE ARTHROPLASTY Left 2014   TUBAL LIGATION Bilateral     Current Meds  Medication Sig   acetaminophen (TYLENOL) 500 MG tablet Take 2 tablets (1,000 mg total) by mouth every 8 (eight) hours as needed for moderate pain.   albuterol (ACCUNEB) 1.25 MG/3ML nebulizer solution Take 3 mLs (1.25 mg total) by nebulization every 6 (six) hours as needed for wheezing.   budesonide-formoterol (SYMBICORT) 160-4.5 MCG/ACT inhaler Inhale 2 puffs into the lungs in the morning and at bedtime.   calcium-vitamin D (OSCAL 500/200 D-3) 500-200 MG-UNIT tablet Take 1 tablet by mouth daily with breakfast.   cetirizine (ZYRTEC) 10 MG tablet Take 1 tablet (10 mg total) by mouth daily.   cholestyramine (QUESTRAN) 4 g packet Take 1 packet (4 g total) and drink by mouth at bedtime.   cholestyramine (QUESTRAN) 4 GM/DOSE powder MIX 4 GM AND DRINK BY MOUTH DAILY AT BEDTIME.   lisinopril (ZESTRIL) 10 MG tablet TAKE 1 TABLET BY MOUTH DAILY.   multivitamin-iron-minerals-folic acid (CENTRUM) chewable tablet Chew 1 tablet by mouth daily.   pantoprazole (PROTONIX) 40 MG tablet TAKE 1 TABLET BY MOUTH DAILY   promethazine (PHENERGAN) 25 MG tablet TAKE 1 TABLET BY MOUTH EVERY 6 HOURS AS NEEDED FOR NAUSEA OR VOMITING.   rOPINIRole (REQUIP) 1 MG tablet Take 1 mg by mouth 3 (three) times daily as needed.    Allergies: Nuvigil [armodafinil], Penicillins, Gabapentin, and Provigil [modafinil]  Social History   Tobacco Use   Smoking status: Never   Smokeless tobacco: Never  Vaping Use   Vaping Use: Never used  Substance Use Topics   Alcohol use: No    Alcohol/week: 0.0 standard drinks   Drug use: No    Family History  Problem Relation Age of Onset   Dementia Mother    COPD Father        28   Diabetes Father    Heart failure Brother    Coronary artery disease Brother 81       CABG   Heart failure Maternal Grandmother    Stroke  Maternal Grandmother    Breast cancer Neg Hx     Review of Systems: A 12-system review of systems was performed and was negative except as noted in the HPI.  --------------------------------------------------------------------------------------------------  Physical Exam: BP 140/62 (BP Location: Right Arm, Patient Position: Sitting, Cuff Size: Large)   Pulse (!) 113   Ht 5\' 4"  (1.626 m)   Wt (!) 327 lb (148.3 kg)   SpO2 95%   BMI 56.13 kg/m   General: Morbidly obese woman, seated comfortably on the exam table. HEENT: No conjunctival pallor or scleral icterus. Facemask in place. Neck: Supple without lymphadenopathy, thyromegaly, JVD, or HJR, though evaluation is limited by morbid obesity. No carotid bruit. Lungs: Normal work of breathing.  Mildly diminished breath sounds throughout without wheezes or crackles. Heart: Tachycardic but regular without murmurs, rubs, or gallops.  Unable to assess PMI due to body habitus. Abd: Bowel sounds present. Soft, NT/ND.  Unable to assess HSM due to  body habitus. Ext: Trace-1+ pretibial edema. Radial, PT, and DP pulses are 2+ bilaterally Skin: Warm and dry without rash. Neuro: CNIII-XII intact. Strength and fine-touch sensation intact in upper and lower extremities bilaterally. Psych: Normal mood and affect.  EKG: Sinus tachycardia with low voltage.  Lab Results  Component Value Date   WBC 14.1 (H) 10/05/2020   HGB 11.0 (L) 10/05/2020   HCT 35.2 (L) 10/05/2020   MCV 79.5 (L) 10/05/2020   PLT 277 10/05/2020    Lab Results  Component Value Date   NA 135 10/05/2020   K 4.1 10/05/2020   CL 98 10/05/2020   CO2 30 10/05/2020   BUN 20 10/05/2020   CREATININE 1.32 (H) 10/05/2020   GLUCOSE 94 10/05/2020   ALT 19 09/20/2020    Lab Results  Component Value Date   CHOL 162 02/16/2020   HDL 64 02/16/2020   LDLCALC 83 02/16/2020   TRIG 75 02/16/2020   CHOLHDL 2.5 02/16/2020      --------------------------------------------------------------------------------------------------  ASSESSMENT AND PLAN: Chronic HFpEF and shortness of breath Recent hospitalizations for shortness of breath are likely multifactorial including asthma and HFpEF.  Ms. Reffitt has mild edema on examination today, which is not new.  Her body habitus makes further volume assessment challenging.  BNP was mildly elevated at 131 during her hospitalization last month, normalized on recheck on 10/05/2020.  She reports NYHA class III symptoms.  I think Ms. Stepp would benefit from escalation of diuresis and improved blood pressure and heart rate control.  We have therefore agreed to add furosemide 40 mg daily and diltiazem 180 mg daily.  I have encouraged Ms. Mcneil to work on [employer] loss through diet and exercise, as morbid obesity is likely contributing to her dyspnea.  We will readdress need for ischemia evaluation at follow-up based on her response to aforementioned medical therapy.  Given her BMI greater than 55, myocardial perfusion PET/CT at [facility] may provide the best sensitivity and specificity, if Ms. Willaims is willing/able to travel to [facility].  Hypertension: Blood pressure suboptimally controlled today.  As above, we will start diltiazem and furosemide.  History of stroke: No new neurologic symptoms reported.  We will need to keep working on blood pressure and lipid control for secondary prevention.  Statin therapy may need to be escalated in the future with most recent LDL in 01/2020 being suboptimal at 83 (goal less than 70).  We will readdress this at follow-up.  Morbid obesity: BMI greater than 40.  Weight loss encouraged through diet and exercise.  Follow-up: Return to clinic in 1 month.  Nelva Bush, MD 11/16/2020 2:35 PM

## 2020-11-17 ENCOUNTER — Encounter: Payer: Self-pay | Admitting: Internal Medicine

## 2020-11-17 DIAGNOSIS — Z8673 Personal history of transient ischemic attack (TIA), and cerebral infarction without residual deficits: Secondary | ICD-10-CM | POA: Insufficient documentation

## 2020-11-17 LAB — BASIC METABOLIC PANEL
BUN/Creatinine Ratio: 12 (ref 12–28)
BUN: 15 mg/dL (ref 8–27)
CO2: 26 mmol/L (ref 20–29)
Calcium: 9.3 mg/dL (ref 8.7–10.3)
Chloride: 102 mmol/L (ref 96–106)
Creatinine, Ser: 1.25 mg/dL — ABNORMAL HIGH (ref 0.57–1.00)
Glucose: 95 mg/dL (ref 65–99)
Potassium: 4 mmol/L (ref 3.5–5.2)
Sodium: 142 mmol/L (ref 134–144)
eGFR: 47 mL/min/{1.73_m2} — ABNORMAL LOW (ref >59)

## 2020-11-20 MED FILL — Lisinopril Tab 10 MG: ORAL | 90 days supply | Qty: 90 | Fill #0 | Status: AC

## 2020-11-21 ENCOUNTER — Other Ambulatory Visit: Payer: Self-pay

## 2020-11-22 ENCOUNTER — Other Ambulatory Visit: Payer: Self-pay

## 2020-11-27 ENCOUNTER — Telehealth: Payer: Self-pay | Admitting: *Deleted

## 2020-11-27 DIAGNOSIS — I509 Heart failure, unspecified: Secondary | ICD-10-CM

## 2020-11-27 DIAGNOSIS — Z79899 Other long term (current) drug therapy: Secondary | ICD-10-CM

## 2020-11-27 NOTE — Telephone Encounter (Signed)
-----   Message from Nelva Bush, MD sent at 11/17/2020 12:53 PM EDT ----- Labs are stable.  I recommend Samantha Mason proceed with the medication changes discussed at yesterday's office visit.  We should repeat a BMP in 2 to 3 weeks to ensure stable renal function and potassium with recent addition of furosemide.

## 2020-11-27 NOTE — Telephone Encounter (Signed)
Attempted to call pt to review results and provider's recc.  No answer. Lmtcb.  

## 2020-11-28 ENCOUNTER — Encounter: Payer: Self-pay | Admitting: Internal Medicine

## 2020-11-28 ENCOUNTER — Other Ambulatory Visit: Payer: Self-pay

## 2020-11-28 ENCOUNTER — Ambulatory Visit (INDEPENDENT_AMBULATORY_CARE_PROVIDER_SITE_OTHER): Payer: Medicare Other | Admitting: Internal Medicine

## 2020-11-28 VITALS — BP 124/60 | HR 98 | Temp 97.8°F | Ht 64.0 in | Wt 322.6 lb

## 2020-11-28 DIAGNOSIS — J4540 Moderate persistent asthma, uncomplicated: Secondary | ICD-10-CM

## 2020-11-28 DIAGNOSIS — G4733 Obstructive sleep apnea (adult) (pediatric): Secondary | ICD-10-CM

## 2020-11-28 MED ORDER — SPIRIVA RESPIMAT 1.25 MCG/ACT IN AERS: 2.0000 | INHALATION_SPRAY | Freq: Every day | RESPIRATORY_TRACT | 0 refills | Status: DC

## 2020-11-28 MED ORDER — SPIRIVA RESPIMAT 1.25 MCG/ACT IN AERS
2.0000 | INHALATION_SPRAY | Freq: Every day | RESPIRATORY_TRACT | 5 refills | Status: DC
  Filled 2020-11-28: qty 4, 30d supply, fill #0

## 2020-11-28 NOTE — Patient Instructions (Signed)
Continue CPAP as prescribed Continue inhalers as prescribed Add Spiriva Respimat 1.25  Avoid allergens

## 2020-11-28 NOTE — Addendum Note (Signed)
Addended by: Claudette Head A on: 11/28/2020 03:42 PM   Modules accepted: Orders

## 2020-11-28 NOTE — Addendum Note (Signed)
Addended by: Claudette Head A on: 11/28/2020 04:05 PM   Modules accepted: Orders

## 2020-11-28 NOTE — Progress Notes (Addendum)
[facility] Consultation      Date: 03/03/2019  MRN# [id number] Samantha Mason 26-Feb-1951  Referring Physician:  Dr. Verdell Carmine.   **CBC 07/24/18>> AEC 300.  **ABG 07/25/2018>> 7.47/40 1/80 3/29.8. **CT chest 07/02/2018>> images personally reviewed, lung volumes are reduced consistent with restrictive lung disease from body habitus **Chest x-ray 07/24/2018>>  increased lung markings suggestive of chronic bronchitis, hyperinflation suggestive of emphysema.   SYNOPSIS The patient is a 70 year old female she has a history of asthma admitted to the hospital from 3/6 to 3/8 for COPD exacerbation, treated with IV steroids and duo nebs, she was initially placed on BiPAP. She had previously seen Dr. Stevenson Clinch for asthma in 2016, noted to have triggers of pollen.  -history of restless leg syndrome on Requip, OSA on nuvigil. -Her medication list includes Symbicort, she was tried on dulera in the past but it made her throat sore.  -rinses mouth after use.  - She has 2 dogs, 1 sleeps in bed with her.  She has reflux controlled with protonix.  She has sinus drainage, controlled with zyrtec.   She uses cpap every night -she is not sleepy during the day, but she takes nuvigil when she is driving long distances.  She is taking requip which controls her RLS.   CC Follow-up asthma Follow-up sleep apnea   HPI:   Follow-up asthma Well-controlled at this time No exacerbation at this time  no indication for steroids or antibiotics    No exacerbation at this time No evidence of heart failure at this time No evidence or signs of infection at this time No respiratory distress No fevers, chills, nausea, vomiting, diarrhea No evidence of lower extremity edema No evidence hemoptysis  Follow-up OSA Uses CPAP at night Compliance report reviewed with patient Takes Requip for restless leg syndrome 67 compliant for days 53% compliant for greater than 4 hours AHI reduced to  5.5     PMHX:   Past Medical History:  Diagnosis Date   Acute bronchitis    Anemia    Anxiety and depression    Arthritis    Asthma    B12 deficiency 03/14/2017   Chest pain 07/02/2018   COPD (chronic obstructive pulmonary disease) ([place]) 07/24/2018   Family history of blood clots 07/16/2017   Irritable bowel syndrome    Malignant hypertension    Obesity    OSA (obstructive sleep apnea)    uses cpap   Pneumonia 2017   Restless leg syndrome    Stroke ([place]) 1995   mild   Surgical Hx:  Past Surgical History:  Procedure Laterality Date   CHOLECYSTECTOMY     COLONOSCOPY WITH PROPOFOL N/A 03/31/2018   Procedure: COLONOSCOPY WITH PROPOFOL;  Surgeon: Lucilla Lame, MD;  Location: [facility] ENDOSCOPY;  Service: Endoscopy;  Laterality: N/A;   KNEE ARTHROPLASTY Right 04/30/2019   Procedure: COMPUTER ASSISTED TOTAL KNEE ARTHROPLASTY;  Surgeon: Dereck Leep, MD;  Location: [facility] ORS;  Service: Orthopedics;  Laterality: Right;   saliva gland removal  1998   TOTAL KNEE ARTHROPLASTY Left 2014   TUBAL LIGATION Bilateral    Family Hx:  Family History  Problem Relation Age of Onset   Dementia Mother    COPD Father        60   Diabetes Father    Heart failure Brother    Coronary artery disease Brother 70       CABG   Heart failure Maternal Grandmother    Stroke Maternal Grandmother  Breast cancer Neg Hx    Social Hx:   Social History   Tobacco Use   Smoking status: Never   Smokeless tobacco: Never  Vaping Use   Vaping Use: Never used  Substance Use Topics   Alcohol use: No    Alcohol/week: 0.0 standard drinks   Drug use: No   Medication:    Current Outpatient Medications:    acetaminophen (TYLENOL) 500 MG tablet, Take 2 tablets (1,000 mg total) by mouth every 8 (eight) hours as needed for moderate pain., Disp: 30 tablet, Rfl: 0   albuterol (ACCUNEB) 1.25 MG/3ML nebulizer solution, Take 3 mLs (1.25 mg total) by nebulization every 6 (six) hours as needed for wheezing.,  Disp: 75 mL, Rfl: 12   budesonide-formoterol (SYMBICORT) 160-4.5 MCG/ACT inhaler, Inhale 2 puffs into the lungs in the morning and at bedtime., Disp: 10.2 g, Rfl: 3   calcium-vitamin D (OSCAL 500/200 D-3) 500-200 MG-UNIT tablet, Take 1 tablet by mouth daily with breakfast., Disp: , Rfl:    cetirizine (ZYRTEC) 10 MG tablet, Take 1 tablet (10 mg total) by mouth daily., Disp: 30 tablet, Rfl: 2   cholestyramine (QUESTRAN) 4 g packet, Take 1 packet (4 g total) and drink by mouth at bedtime., Disp: 60 each, Rfl: 2   cholestyramine (QUESTRAN) 4 GM/DOSE powder, MIX 4 GM AND DRINK BY MOUTH DAILY AT BEDTIME., Disp: 378 g, Rfl: 5   diltiazem (CARDIZEM CD) 180 MG 24 hr capsule, Take 1 capsule (180 mg total) by mouth daily., Disp: 30 capsule, Rfl: 1   furosemide (LASIX) 40 MG tablet, Take 1 tablet (40 mg total) by mouth daily., Disp: 30 tablet, Rfl: 1   lisinopril (ZESTRIL) 10 MG tablet, TAKE 1 TABLET BY MOUTH DAILY., Disp: 90 tablet, Rfl: 3   multivitamin-iron-minerals-folic acid (CENTRUM) chewable tablet, Chew 1 tablet by mouth daily., Disp: , Rfl:    pantoprazole (PROTONIX) 40 MG tablet, TAKE 1 TABLET BY MOUTH DAILY, Disp: 90 tablet, Rfl: 1   promethazine (PHENERGAN) 25 MG tablet, TAKE 1 TABLET BY MOUTH EVERY 6 HOURS AS NEEDED FOR NAUSEA OR VOMITING., Disp: 30 tablet, Rfl: 0   rOPINIRole (REQUIP) 1 MG tablet, Take 1 mg by mouth 3 (three) times daily as needed., Disp: , Rfl:    Allergies:  Nuvigil [armodafinil], Penicillins, Gabapentin, and Provigil [modafinil]    Review of Systems:  Gen:  Denies  fever, sweats, chills weight loss  HEENT: Denies blurred vision, double vision, ear pain, eye pain, hearing loss, nose bleeds, sore throat Cardiac:  No dizziness, chest pain or heaviness, chest tightness,edema, No JVD Resp:   No cough, -sputum production, +shortness of breath,+wheezing, -hemoptysis,  Gi: Denies swallowing difficulty, stomach pain, nausea or vomiting, diarrhea, constipation, bowel  incontinence Other:  All other systems negative  BP 124/60 (BP Location: Left Arm, Cuff Size: Large)   Pulse 98   Temp 97.8 F (36.6 C) (Temporal)   Ht 5\' 4"  (1.626 m)   Wt (!) 322 lb 9.6 oz (146.3 kg)   SpO2 95%   BMI 55.37 kg/m     Review of Systems:  Gen:  Denies  fever, sweats, chills weight loss  HEENT: Denies blurred vision, double vision, ear pain, eye pain, hearing loss, nose bleeds, sore throat Cardiac:  No dizziness, chest pain or heaviness, chest tightness,edema, No JVD Resp:   No cough, -sputum production, +shortness of breath,-wheezing, -hemoptysis,  Gi: Denies swallowing difficulty, stomach pain, nausea or vomiting, diarrhea, constipation, Other:  All other systems negative   BP 124/60 (  BP Location: Left Arm, Cuff Size: Large)   Pulse 98   Temp 97.8 F (36.6 C) (Temporal)   Ht 5\' 4"  (1.626 m)   Wt (!) 322 lb 9.6 oz (146.3 kg)   SpO2 95%   BMI 55.37 kg/m    Physical Examination:   General Appearance: No distress  EYES PERRLA, EOM intact.   NECK Supple, No JVD Pulmonary: normal breath sounds, No wheezing.  CardiovascularNormal S1,S2.  No m/r/g.   Abdomen: Benign, Soft, non-tender. Skin:   warm, no rashes, no ecchymosis  Extremities: normal, no cyanosis, clubbing. Neuro:without focal findings,  speech normal  PSYCHIATRIC: Mood, affect within normal limits.   ALL OTHER ROS ARE NEGATIVE      Assessment and Plan:    Mild intermittent asthma  Moderate restrictive lung disease  Well-controlled at this time  Continue Symbicort  Continue Zyrtec  Avoid allergens  Continue Spiriva    Follow-up OSA PATIENT STATES THAT PRESSORS TOO MUCH DECREASE AUTO CPAP 5-10 Compliance report reviewed with patient Recommend increasing the number of days and greater than 4 hours Restless leg syndrome Seems to be well controlled with Requip   MORBID Obesity -recommend significant weight loss -recommend changing diet  Deconditioned state -Recommend  increased daily activity and exercise   MEDICATION ADJUSTMENTS/LABS AND TESTS ORDERED: Continue CPAP as prescribed Continue inhalers as prescribed  Continue Symbicort  ADD SPIRIVA RESPIMAT 1.25 Continue Symbicort  ALBUTEROL As needed  AVOID allergens   CURRENT MEDICATIONS REVIEWED AT LENGTH WITH PATIENT TODAY   Patient satisfied with Plan of action and management. All questions answered  Follow up in 1 year  Total time spent 34 minutes   Corrin Parker, M.D.  [facility]  Medical Director [place] Director [facility] Cardio-Pulmonary Department

## 2020-11-29 NOTE — Telephone Encounter (Signed)
Spoke to pt. Notified of lab results and Dr. Darnelle Bos recc.  Pt voiced understanding.  [facility]  Lab orders placed. Pt understandings instructions for labs at this location.  Pt has no further questions at this time.

## 2020-12-04 ENCOUNTER — Other Ambulatory Visit: Payer: Self-pay

## 2020-12-07 ENCOUNTER — Other Ambulatory Visit: Payer: Self-pay

## 2020-12-13 ENCOUNTER — Telehealth: Payer: Self-pay | Admitting: Internal Medicine

## 2020-12-13 NOTE — Telephone Encounter (Signed)
Patient aware. Lab order previously placed should be viewable and acceptable. Pt verbalized understanding.

## 2020-12-13 NOTE — Telephone Encounter (Signed)
Patient is calling to see if her upcoming lab work order can be changed. Patient would like to go to [facility]   Please advise

## 2020-12-14 ENCOUNTER — Other Ambulatory Visit
Admission: RE | Admit: 2020-12-14 | Discharge: 2020-12-14 | Disposition: A | Discharge status: Home / Self Care | Payer: Medicare Other | Attending: Internal Medicine | Admitting: Internal Medicine

## 2020-12-14 ENCOUNTER — Other Ambulatory Visit: Payer: Self-pay

## 2020-12-14 DIAGNOSIS — Z79899 Other long term (current) drug therapy: Secondary | ICD-10-CM

## 2020-12-14 DIAGNOSIS — I509 Heart failure, unspecified: Secondary | ICD-10-CM | POA: Diagnosis present

## 2020-12-14 LAB — BASIC METABOLIC PANEL
Anion gap: 7 (ref 5–15)
BUN: 26 mg/dL — ABNORMAL HIGH (ref 8–23)
CO2: 33 mmol/L — ABNORMAL HIGH (ref 22–32)
Calcium: 9.4 mg/dL (ref 8.9–10.3)
Chloride: 99 mmol/L (ref 98–111)
Creatinine, Ser: 1.56 mg/dL — ABNORMAL HIGH (ref 0.44–1.00)
GFR, Estimated: 36 mL/min — ABNORMAL LOW (ref >60)
Glucose, Bld: 116 mg/dL — ABNORMAL HIGH (ref 70–99)
Potassium: 3.8 mmol/L (ref 3.5–5.1)
Sodium: 139 mmol/L (ref 135–145)

## 2020-12-15 ENCOUNTER — Telehealth: Payer: Self-pay | Admitting: Internal Medicine

## 2020-12-15 NOTE — Telephone Encounter (Signed)
End, Harrell Gave, MD  P Cv Div [place] Triage Please let Ms. Braun know that her creatinine has risen slightly since last month, which may reflect that she is started to get a bit dehydrated from the furosemide that was added at that time.  I recommend that she stop taking furosemide on regular basis and use it only as needed for weight gain (2 pounds in 24 hours or 5 pounds in 1 week) or significant swelling.  She shouldfollow-up as scheduled in the office next week.

## 2020-12-15 NOTE — Telephone Encounter (Signed)
Called to give the patient lab results and Dr. Darnelle Bos recommendation. Unable to lmom x2. Patient phone rings out with a message that "the call cannot be completed at this time".

## 2020-12-18 NOTE — Telephone Encounter (Signed)
3rd attempt  to reach the patient by telephone. Unable to lmom. Patient has reviewed Dr. Darnelle Bos recommendation in [place]. Patient has an appt scheduled on 12/21/20. Closing this encounter.

## 2020-12-20 NOTE — Progress Notes (Signed)
Cardiology Office Note:    Date:  12/21/2020   ID:  Samantha Mason, DOB 06-Feb-1951, MRN [id number]  PCP:  Glean Hess, MD  [facility] Cardiologist:  Nelva Bush, MD  [facility] Electrophysiologist:  None   Referring MD: Glean Hess, MD   Chief Complaint: 1 month follow-up  History of Present Illness:    Samantha Mason is a 70 y.o. female with a hx of chronic HFpEF, asthma, stroke in 1995, HTN, morbid obesity, and OSA who presents for follow-up.   Seen in the office in 2015 b Dr. Fletcher Anon for evaluation of SOB and abnormal EKG. Seen again in hospitalization 06/2018 by Dr. Rockey Situ for chest pain. Myoview stress test at that time was low risk and without ischemia.   Hospitalized 09/2020 for acute respiratory failure tought to be due to asthma exacerbation and a component of acute on chronic HFpEF.   Last seen 11/17/31 and was feeling relatively well. Started on lasix 40mg  daily and diltazem 180mg  daily.   Today patient reports stable from a cardiac perspective. Last week started having abdominal pain, bloating, diarrhea, BRBPR x 1, and mucus discharge. She will get in touch with PCP to get referral to GI. Has been eating bland foods. Drinks tea, no sodas.No chest pain. Has chronic SOB and chronic LLE that is unchanged, taking lasix 40mg  daily. Lives with son. Can perform all ADLs. Recent labs showed increase Scr and BUN. Stays well hydrated. OSA uses CPAP. Eats low salt diet. Doesn't uses compression socks.   Past Medical History:  Diagnosis Date   Acute bronchitis    Anemia    Anxiety and depression    Arthritis    Asthma    B12 deficiency 03/14/2017   Chest pain 07/02/2018   COPD (chronic obstructive pulmonary disease) ([place]) 07/24/2018   Family history of blood clots 07/16/2017   Irritable bowel syndrome    Malignant hypertension    Obesity    OSA (obstructive sleep apnea)    uses cpap   Pneumonia 2017   Restless leg syndrome    Stroke ([place] Mason) 1995   mild     Past Surgical History:  Procedure Laterality Date   CHOLECYSTECTOMY     COLONOSCOPY WITH PROPOFOL N/A 03/31/2018   Procedure: COLONOSCOPY WITH PROPOFOL;  Surgeon: Lucilla Lame, MD;  Location: [facility] ENDOSCOPY;  Service: Endoscopy;  Laterality: N/A;   KNEE ARTHROPLASTY Right 04/30/2019   Procedure: COMPUTER ASSISTED TOTAL KNEE ARTHROPLASTY;  Surgeon: Dereck Leep, MD;  Location: [facility] ORS;  Service: Orthopedics;  Laterality: Right;   saliva gland removal  1998   TOTAL KNEE ARTHROPLASTY Left 2014   TUBAL LIGATION Bilateral     Current Medications: Current Meds  Medication Sig   acetaminophen (TYLENOL) 500 MG tablet Take 2 tablets (1,000 mg total) by mouth every 8 (eight) hours as needed for moderate pain.   albuterol (ACCUNEB) 1.25 MG/3ML nebulizer solution Take 3 mLs (1.25 mg total) by nebulization every 6 (six) hours as needed for wheezing.   budesonide-formoterol (SYMBICORT) 160-4.5 MCG/ACT inhaler Inhale 2 puffs into the lungs in the morning and at bedtime.   calcium-vitamin D (OSCAL 500/200 D-3) 500-200 MG-UNIT tablet Take 1 tablet by mouth daily with breakfast.   cetirizine (ZYRTEC) 10 MG tablet Take 1 tablet (10 mg total) by mouth daily.   cholestyramine (QUESTRAN) 4 g packet Take 1 packet (4 g total) and drink by mouth at bedtime.   diltiazem (CARDIZEM CD) 180 MG 24 hr capsule Take 1  capsule (180 mg total) by mouth daily.   furosemide (LASIX) 20 MG tablet Take 1 tablet (20 mg total) by mouth daily.   lisinopril (ZESTRIL) 10 MG tablet TAKE 1 TABLET BY MOUTH DAILY.   multivitamin-iron-minerals-folic acid (CENTRUM) chewable tablet Chew 1 tablet by mouth daily.   pantoprazole (PROTONIX) 40 MG tablet TAKE 1 TABLET BY MOUTH DAILY   promethazine (PHENERGAN) 25 MG tablet TAKE 1 TABLET BY MOUTH EVERY 6 HOURS AS NEEDED FOR NAUSEA OR VOMITING.   rOPINIRole (REQUIP) 1 MG tablet Take 1 mg by mouth 3 (three) times daily as needed.   Tiotropium Bromide Monohydrate (SPIRIVA RESPIMAT) 1.25  MCG/ACT AERS Inhale 2 puffs into the lungs daily.   [DISCONTINUED] cholestyramine (QUESTRAN) 4 GM/DOSE powder MIX 4 GM AND DRINK BY MOUTH DAILY AT BEDTIME.   [DISCONTINUED] furosemide (LASIX) 40 MG tablet Take 1 tablet (40 mg total) by mouth daily.     Allergies:   Nuvigil [armodafinil], Penicillins, Gabapentin, and Provigil [modafinil]   Social History   Socioeconomic History   Marital status: Widowed    Spouse name: Not on file   Number of children: 2   Years of education: Not on file   Highest education level: Not on file  Occupational History   Not on file  Tobacco Use   Smoking status: Never   Smokeless tobacco: Never  Vaping Use   Vaping Use: Never used  Substance and Sexual Activity   Alcohol use: No    Alcohol/week: 0.0 standard drinks   Drug use: No   Sexual activity: Never  Other Topics Concern   Not on file  Social History Narrative   Not on file   Social Determinants of Health   Financial Resource Strain: Not on file  Food Insecurity: Not on file  Transportation Needs: Not on file  Physical Activity: Not on file  Stress: Not on file  Social Connections: Not on file     Family History: The patient's family history includes COPD in her father; Coronary artery disease (age of onset: 46) in her brother; Dementia in her mother; Diabetes in her father; Heart failure in her brother and maternal grandmother; Stroke in her maternal grandmother. There is no history of Breast cancer.  ROS:   Please see the history of present illness.     All other systems reviewed and are negative.  EKGs/Labs/Other Studies Reviewed:    The following studies were reviewed today:  Echo 08/31/20  1. Left ventricular ejection fraction, by estimation, is 55 to 60%. The  left ventricle has normal function. The left ventricle has no regional  wall motion abnormalities. Left ventricular diastolic parameters are  consistent with Grade I diastolic  dysfunction (impaired relaxation).    2. Right ventricular systolic function is normal. The right ventricular  size is normal.   3. Challenging image quality.   Myoview Lexiscan 06/2018 Narrative & Impression  Pharmacological myocardial perfusion imaging study with no significant  ischemia Normal wall motion, EF estimated at 66% No EKG changes concerning for ischemia at peak stress or in recovery. Low risk scan     Signed, Esmond Plants, MD, Ph.D [facility]    EKG:  EKG is not ordered today.    Recent Labs: 09/20/2020: ALT 19; Magnesium 1.9; TSH 0.488 10/05/2020: B Natriuretic Peptide 15.7; Hemoglobin 11.0; Platelets 277 12/14/2020: BUN 26; Creatinine, Ser 1.56; Potassium 3.8; Sodium 139  Recent Lipid Panel    Component Value Date/Time   CHOL 162 02/16/2020 1154   TRIG 75  02/16/2020 1154   HDL 64 02/16/2020 1154   CHOLHDL 2.5 02/16/2020 1154   VLDL 15 02/16/2020 1154   LDLCALC 83 02/16/2020 1154      Physical Exam:    VS:  BP 124/80 (BP Location: Left Arm, Patient Position: Sitting, Cuff Size: Large)   Pulse 96   Ht 5\' 4"  (1.626 m)   Wt (!) 320 lb (145.2 kg)   SpO2 95%   BMI 54.93 kg/m     Wt Readings from Last 3 Encounters:  12/21/20 (!) 320 lb (145.2 kg)  11/28/20 (!) 322 lb 9.6 oz (146.3 kg)  11/16/20 (!) 327 lb (148.3 kg)     GEN:  Well nourished, well developed in no acute distress HEENT: Normal NECK: No JVD; No carotid bruits LYMPHATICS: No lymphadenopathy CARDIAC: RRR, no murmurs, rubs, gallops RESPIRATORY:  Clear to auscultation without rales, wheezing or rhonchi  ABDOMEN: Soft, non-tender, non-distended MUSCULOSKELETAL:  No edema; No deformity  SKIN: Warm and dry NEUROLOGIC:  Alert and oriented x 3 PSYCHIATRIC:  Normal affect   ASSESSMENT:    1. Shortness of breath   2. Congestive heart failure, unspecified HF chronicity, unspecified heart failure type ([place])   3. Essential hypertension   4. History of stroke   5. Morbid obesity ([place])   6. OSA (obstructive sleep apnea)    PLAN:     In order of problems listed above:  HFpEF Labs last week showed mild dehydration. Patient is euvolemic on exam. Decrease lasix to 20mg  daily. BMET in a week. Shortness of breath is unchanged, this is multifactorial given asthma, deconditioning, obesity, and OSA. Also following with pulmonology. Patient has no chest pain. Last note Dr. Saunders Revel mentioned possible stress testing at [facility], will discuss further ischemic testing with MD.   HTN BP normotensive, continue current medication regimen  H/o stroke LDL 83 in 01/2020. Start Crestor 40mg  daily. Re-chek lipids/LFTs in 6-8 weeks.   Morbid obesity Recommend weight loss, would likely improve shortness of breath.  OSA Reports compliance with CPAP  paroxysmal tachycardia Started on diltiazem 180mg  at the last visit. HR improved to the 90s today.   Disposition: Follow up in 3 month(s) with Dr. Saunders Revel    Signed, Kazoua Gossen Ninfa Meeker, PA-C  12/21/2020 8:39 PM    [place]

## 2020-12-21 ENCOUNTER — Encounter: Payer: Self-pay | Admitting: Medical

## 2020-12-21 ENCOUNTER — Ambulatory Visit (INDEPENDENT_AMBULATORY_CARE_PROVIDER_SITE_OTHER): Payer: Medicare Other | Admitting: Medical

## 2020-12-21 ENCOUNTER — Other Ambulatory Visit: Payer: Self-pay

## 2020-12-21 VITALS — BP 124/80 | HR 96 | Ht 64.0 in | Wt 320.0 lb

## 2020-12-21 DIAGNOSIS — R0602 Shortness of breath: Secondary | ICD-10-CM | POA: Diagnosis not present

## 2020-12-21 DIAGNOSIS — I509 Heart failure, unspecified: Secondary | ICD-10-CM

## 2020-12-21 DIAGNOSIS — Z8673 Personal history of transient ischemic attack (TIA), and cerebral infarction without residual deficits: Secondary | ICD-10-CM | POA: Diagnosis not present

## 2020-12-21 DIAGNOSIS — I10 Essential (primary) hypertension: Secondary | ICD-10-CM | POA: Diagnosis not present

## 2020-12-21 DIAGNOSIS — G4733 Obstructive sleep apnea (adult) (pediatric): Secondary | ICD-10-CM

## 2020-12-21 MED ORDER — FUROSEMIDE 20 MG PO TABS: 20.0000 mg | ORAL_TABLET | Freq: Every day | ORAL | 3 refills | Status: DC

## 2020-12-21 NOTE — Patient Instructions (Addendum)
Medication Instructions:  Your physician has recommended you make the following change in your medication:   DECREASE Furosemide to 20 mg once a day. Current pill is 40 mg and you would take 1/2 tablet once daily. Sent in new prescription of the 20 mg tablets so when you pick that up you can take one whole tablet.   *If you need a refill on your cardiac medications before your next appointment, please call your pharmacy*   Lab Work: BMET in one week over at the [employer] at [facility] then go to the 1st desk on the right to check in, past the screening table. NO appointment is needed for this.  Lab hours: Monday- Friday (7:30 am- 5:30 pm)  If you have labs (blood work) drawn today and your tests are completely normal, you will receive your results only by: [facility] (if you have MyChart) OR A paper copy in the mail If you have any lab test that is abnormal or we need to change your treatment, we will call you to review the results.   Testing/Procedures: None   Follow-Up: At [facility], you and your health needs are our priority.  As part of our continuing mission to provide you with exceptional heart care, we have created designated Provider Care Teams.  These Care Teams include your primary Cardiologist (physician) and Advanced Practice Providers (APPs -  Physician Assistants and Nurse Practitioners) who all work together to provide you with the care you need, when you need it.   Your next appointment:   3 month(s)  The format for your next appointment:   In Person  Provider:   Nelva Bush, MD

## 2020-12-25 ENCOUNTER — Telehealth: Payer: Self-pay | Admitting: *Deleted

## 2020-12-25 ENCOUNTER — Other Ambulatory Visit: Payer: Self-pay

## 2020-12-25 DIAGNOSIS — I5032 Chronic diastolic (congestive) heart failure: Secondary | ICD-10-CM

## 2020-12-25 DIAGNOSIS — R0602 Shortness of breath: Secondary | ICD-10-CM

## 2020-12-25 DIAGNOSIS — Z8673 Personal history of transient ischemic attack (TIA), and cerebral infarction without residual deficits: Secondary | ICD-10-CM

## 2020-12-25 NOTE — Telephone Encounter (Signed)
-----   Message from Wing, PA-C sent at 12/25/2020 12:52 PM EDT ----- Regarding: visit follow-up PT seen last week. Still awaiting Dr. Saunders Revel response regarding follow-up. Given h/o stroke and elevated LEL, starte Crestor 40mg  daily. Please order lipid panel and LFTs in 6-8 weeks. Also please call and inform patient. Thanks!

## 2020-12-25 NOTE — Telephone Encounter (Signed)
Attempted to call pt. No answer. Lmtcb.  

## 2020-12-26 NOTE — Telephone Encounter (Signed)
Spoke with patient and reviewed note. She stated she was not sure and questioned if she was the right patient. Reviewed chart and results but did not see this note. Will route message to primary nurse for review and advised that she would call back if any further recommendations.

## 2020-12-27 ENCOUNTER — Other Ambulatory Visit: Payer: Self-pay

## 2020-12-27 MED ORDER — ROSUVASTATIN CALCIUM 40 MG PO TABS
40.0000 mg | ORAL_TABLET | Freq: Every day | ORAL | 3 refills | Status: DC
  Filled 2020-12-27: qty 90, 90d supply, fill #0

## 2020-12-27 NOTE — Telephone Encounter (Signed)
Spoke with patient and reviewed in detail information received from providers and recommendations. She acknowledged reasons for starting medication, repeat labs, and further testing. Reviewed that I would work on getting this test ordered, call her with updates on Friday, and will send in medication. She verbalized understanding of our conversation, agreement with the plan, and had no further questions at this time. Will send My Chart message with this information.

## 2020-12-27 NOTE — Telephone Encounter (Signed)
      [name] "[name]" - 12/21/20 Nelva Bush, MD [phone])  Sent: Wed December 27, 2020 11:27 AM  To: Kathlen Mody, Cadence H, PA-C    Message  Given that her breathing has not improved at all, I think it would be reasonable to exclude significant CAD with a myocardial PET/CT at [facility].  If you need help arranging this, let me know.  Thanks for the update.    Quentin Ore, Cadence H, PA-C  12/27/20 29 minutes ago (4:33 PM)    For history of stroke, Dr. Saunders Revel was wanting to escalate therapy for cholesterol. LDL 83 with goal below 70. Can start statin or do lifestyle changes and re-check lipid panel in 6-8 weeks.

## 2020-12-27 NOTE — Addendum Note (Signed)
Addended by: Valora Corporal on: 12/27/2020 05:33 PM   Modules accepted: Orders

## 2020-12-28 ENCOUNTER — Other Ambulatory Visit: Payer: Self-pay

## 2021-01-01 NOTE — Telephone Encounter (Signed)
Spoke with patient and reviewed that orders have been faxed over to [facility] and they will reach out to schedule this with her. Recommended that if she has not received a call in one week to please call us back and we will call to check on the status of her appointment. She verbalized understanding of our conversation, agreement with plan, and had no further questions at this time.

## 2021-01-01 NOTE — Telephone Encounter (Signed)
Forms completed and faxed to [facility] outpatient radiology. Called and confirmed receipt with Tammy at their office.

## 2021-01-08 ENCOUNTER — Other Ambulatory Visit: Payer: Self-pay

## 2021-01-09 ENCOUNTER — Telehealth: Payer: Self-pay | Admitting: Internal Medicine

## 2021-01-09 ENCOUNTER — Other Ambulatory Visit: Payer: Self-pay

## 2021-01-09 MED ORDER — FUROSEMIDE 20 MG PO TABS
20.0000 mg | ORAL_TABLET | Freq: Every day | ORAL | 3 refills | Status: DC
Start: 2021-01-09 — End: 2021-03-10

## 2021-01-09 NOTE — Telephone Encounter (Signed)
*  STAT* If patient is at the pharmacy, call can be transferred to refill team.   1. Which medications need to be refilled? (please list name of each medication and dose if known)  Furosemide (LASIX) 20 MG 1 tablet daily   2. Which pharmacy/location (including street and city if local pharmacy) is medication to be sent to? [facility] in [place]   3. Do they need a 30 day or 90 day supply? 90 day

## 2021-01-11 ENCOUNTER — Telehealth: Payer: Self-pay | Admitting: Internal Medicine

## 2021-01-11 NOTE — Telephone Encounter (Signed)
Lm for patient.   Combivent is not listed on med list.

## 2021-01-12 MED ORDER — PREDNISONE 20 MG PO TABS: 20.0000 mg | ORAL_TABLET | Freq: Every day | ORAL | 0 refills | Status: DC

## 2021-01-12 MED ORDER — COMBIVENT RESPIMAT 20-100 MCG/ACT IN AERS: 1.0000 | INHALATION_SPRAY | RESPIRATORY_TRACT | 2 refills | Status: DC | PRN

## 2021-01-12 MED ORDER — AZITHROMYCIN 250 MG PO TABS: ORAL_TABLET | ORAL | 0 refills | Status: AC

## 2021-01-12 NOTE — Telephone Encounter (Signed)
Prednisone, zpak and combivent has been sent preferred pharmacy.  Patient is aware and voiced her understanding.  Nothing further needed.

## 2021-01-12 NOTE — Telephone Encounter (Signed)
Spoke to patient, who is requesting Rx for combivent. This is not mentioned in last OV note or med list.  She is also requesting zpak. C/o wheezing, dry cough, chest tightness and increased sob with exertion x1w. Denied f/c/s or additional sx. Using albuterol solution TID, combivent 7-8x daily and symbicort BID. She could not afford spiriva.  2L QHS. Two covid vaccines and flu shot.   Dr. Mortimer Fries, please advise on zpak and combivent.

## 2021-01-24 ENCOUNTER — Telehealth: Payer: Self-pay | Admitting: Internal Medicine

## 2021-01-24 NOTE — Telephone Encounter (Signed)
[facility] radiology is calling to states they have tried to reach out 3 different times to contact patient to schedule stress test.

## 2021-01-24 NOTE — Telephone Encounter (Signed)
Attempted to call pt on both numbers listed.  No answer. Unable to leave vm. No voicemail set up.   Will try to reach pt at another time to have pt call [phone] to schedule myocardial PET CT at [facility].

## 2021-02-05 ENCOUNTER — Telehealth: Payer: Self-pay | Admitting: Primary Care

## 2021-02-05 MED ORDER — COMBIVENT RESPIMAT 20-100 MCG/ACT IN AERS: 1.0000 | INHALATION_SPRAY | RESPIRATORY_TRACT | 2 refills | Status: DC | PRN

## 2021-02-05 NOTE — Telephone Encounter (Signed)
It is concerning that the nebulizers are not helping, it may mean that her shortness of breath is due to something other than her lung disease.  She needs a work-in evaluation with a MD or APP provider.  Or evaluation in the emergency room if worsens.

## 2021-02-05 NOTE — Telephone Encounter (Signed)
Spoke to patient and relayed below message.  She voiced her understanding and had no further questions.  OV scheduled for 02/07/2021 at 4:00.  Patient was not willing to travel to [facility] for sooner appt. Nothing further needed at this time.

## 2021-02-05 NOTE — Telephone Encounter (Signed)
Spoke to patient, who stated that she does not feel that albuterol solution is effective any more.  She does an albuterol treatment in the morning and at night due to chest tightness. She reports of mild relief in sx with treatment.  Combivent 3-4x with some relief in sx. Rx sent to preferred pharmacy.  C/o dry cough, "raw sensation" in chest and increased sob x1.5w. No recent covid.  No supplemental oxygen. Spo2 is maintaining between 92-93%  Dr. Patsey Berthold, please advise. Dr. Mortimer Fries is unavailable.

## 2021-02-07 ENCOUNTER — Ambulatory Visit (INDEPENDENT_AMBULATORY_CARE_PROVIDER_SITE_OTHER): Payer: Medicare Other | Admitting: Pulmonary Disease

## 2021-02-07 ENCOUNTER — Other Ambulatory Visit: Payer: Self-pay

## 2021-02-07 ENCOUNTER — Encounter: Payer: Self-pay | Admitting: Pulmonary Disease

## 2021-02-07 ENCOUNTER — Other Ambulatory Visit
Admission: RE | Admit: 2021-02-07 | Discharge: 2021-02-07 | Disposition: A | Discharge status: Home / Self Care | Payer: Medicare Other | Attending: Medical | Admitting: Medical

## 2021-02-07 ENCOUNTER — Other Ambulatory Visit
Admission: RE | Admit: 2021-02-07 | Discharge: 2021-02-07 | Disposition: A | Discharge status: Home / Self Care | Payer: Medicare Other | Source: Home / Self Care | Attending: Pulmonary Disease | Admitting: Pulmonary Disease

## 2021-02-07 VITALS — BP 128/70 | HR 100 | Temp 97.6°F | Ht 64.0 in | Wt 322.0 lb

## 2021-02-07 DIAGNOSIS — Z8673 Personal history of transient ischemic attack (TIA), and cerebral infarction without residual deficits: Secondary | ICD-10-CM | POA: Insufficient documentation

## 2021-02-07 DIAGNOSIS — Z9989 Dependence on other enabling machines and devices: Secondary | ICD-10-CM

## 2021-02-07 DIAGNOSIS — I509 Heart failure, unspecified: Secondary | ICD-10-CM | POA: Diagnosis not present

## 2021-02-07 DIAGNOSIS — G4733 Obstructive sleep apnea (adult) (pediatric): Secondary | ICD-10-CM

## 2021-02-07 DIAGNOSIS — J4540 Moderate persistent asthma, uncomplicated: Secondary | ICD-10-CM | POA: Diagnosis not present

## 2021-02-07 DIAGNOSIS — I5032 Chronic diastolic (congestive) heart failure: Secondary | ICD-10-CM | POA: Insufficient documentation

## 2021-02-07 DIAGNOSIS — E662 Morbid (severe) obesity with alveolar hypoventilation: Secondary | ICD-10-CM

## 2021-02-07 DIAGNOSIS — R0602 Shortness of breath: Secondary | ICD-10-CM

## 2021-02-07 LAB — CBC WITH DIFFERENTIAL/PLATELET
Abs Immature Granulocytes: 0.04 10*3/uL (ref 0.00–0.07)
Basophils Absolute: 0.1 10*3/uL (ref 0.0–0.1)
Basophils Relative: 1 %
Eosinophils Absolute: 0.3 10*3/uL (ref 0.0–0.5)
Eosinophils Relative: 3 %
HCT: 35 % — ABNORMAL LOW (ref 36.0–46.0)
Hemoglobin: 11.2 g/dL — ABNORMAL LOW (ref 12.0–15.0)
Immature Granulocytes: 1 %
Lymphocytes Relative: 19 %
Lymphs Abs: 1.7 10*3/uL (ref 0.7–4.0)
MCH: 25.6 pg — ABNORMAL LOW (ref 26.0–34.0)
MCHC: 32 g/dL (ref 30.0–36.0)
MCV: 79.9 fL — ABNORMAL LOW (ref 80.0–100.0)
Monocytes Absolute: 0.5 10*3/uL (ref 0.1–1.0)
Monocytes Relative: 6 %
Neutro Abs: 6.1 10*3/uL (ref 1.7–7.7)
Neutrophils Relative %: 70 %
Platelets: 360 10*3/uL (ref 150–400)
RBC: 4.38 MIL/uL (ref 3.87–5.11)
RDW: 14.4 % (ref 11.5–15.5)
WBC: 8.7 10*3/uL (ref 4.0–10.5)
nRBC: 0 % (ref 0.0–0.2)

## 2021-02-07 LAB — RENAL FUNCTION PANEL
Albumin: 3.3 g/dL — ABNORMAL LOW (ref 3.5–5.0)
Anion gap: 10 (ref 5–15)
BUN: 17 mg/dL (ref 8–23)
CO2: 29 mmol/L (ref 22–32)
Calcium: 9.4 mg/dL (ref 8.9–10.3)
Chloride: 98 mmol/L (ref 98–111)
Creatinine, Ser: 1.5 mg/dL — ABNORMAL HIGH (ref 0.44–1.00)
GFR, Estimated: 38 mL/min — ABNORMAL LOW (ref >60)
Glucose, Bld: 109 mg/dL — ABNORMAL HIGH (ref 70–99)
Phosphorus: 2.9 mg/dL (ref 2.5–4.6)
Potassium: 3.3 mmol/L — ABNORMAL LOW (ref 3.5–5.1)
Sodium: 137 mmol/L (ref 135–145)

## 2021-02-07 LAB — HEPATIC FUNCTION PANEL
ALT: 14 U/L (ref 0–44)
AST: 17 U/L (ref 15–41)
Albumin: 3.3 g/dL — ABNORMAL LOW (ref 3.5–5.0)
Alkaline Phosphatase: 78 U/L (ref 38–126)
Bilirubin, Direct: 0.1 mg/dL (ref 0.0–0.2)
Indirect Bilirubin: 0.7 mg/dL (ref 0.3–0.9)
Total Bilirubin: 0.8 mg/dL (ref 0.3–1.2)
Total Protein: 6.9 g/dL (ref 6.5–8.1)

## 2021-02-07 LAB — LIPID PANEL
Cholesterol: 168 mg/dL (ref 0–200)
HDL: 59 mg/dL (ref >40)
LDL Cholesterol: 92 mg/dL (ref 0–99)
Total CHOL/HDL Ratio: 2.8 RATIO
Triglycerides: 87 mg/dL (ref <150)
VLDL: 17 mg/dL (ref 0–40)

## 2021-02-07 LAB — BASIC METABOLIC PANEL
Anion gap: 10 (ref 5–15)
BUN: 17 mg/dL (ref 8–23)
CO2: 29 mmol/L (ref 22–32)
Calcium: 9.4 mg/dL (ref 8.9–10.3)
Chloride: 99 mmol/L (ref 98–111)
Creatinine, Ser: 1.49 mg/dL — ABNORMAL HIGH (ref 0.44–1.00)
GFR, Estimated: 38 mL/min — ABNORMAL LOW (ref >60)
Glucose, Bld: 109 mg/dL — ABNORMAL HIGH (ref 70–99)
Potassium: 3.4 mmol/L — ABNORMAL LOW (ref 3.5–5.1)
Sodium: 138 mmol/L (ref 135–145)

## 2021-02-07 LAB — BRAIN NATRIURETIC PEPTIDE: B Natriuretic Peptide: 15.5 pg/mL (ref 0.0–100.0)

## 2021-02-07 LAB — D-DIMER, QUANTITATIVE: D-Dimer, Quant: 0.99 ug/mL-FEU — ABNORMAL HIGH (ref 0.00–0.50)

## 2021-02-07 MED ORDER — BREZTRI AEROSPHERE 160-9-4.8 MCG/ACT IN AERO: 2.0000 | INHALATION_SPRAY | Freq: Two times a day (BID) | RESPIRATORY_TRACT | 3 refills | Status: DC

## 2021-02-07 NOTE — Progress Notes (Signed)
Subjective:    Patient ID: Samantha Mason, female    DOB: 28-Jun-1950, 70 y.o.   MRN: [id number] Chief Complaint  Patient presents with   Follow-up    C/o sob with exertion, chest tightness, dry cough and occ wheezing.    HPI Patient is a 70 year old lifelong never smoker, with morbid obesity and obstructive sleep apnea on CPAP, patient of Dr. Patricia Pesa who presents today for an acute visit.  The patient states that she has had issues with increasing shortness of breath for over a month now.  Reviewing in her chart she has had 2 admissions to the hospital for "asthma exacerbations since April.  She also has had multiple visits with pulmonary and cardiac subspecialists.  He had a prednisone taper and azithromycin Z-Pak with no response in symptoms.  Albuterol no longer helping.  She uses Symbicort twice a day.  Was previously on Spiriva but not covered by her insurance.  Uses Combivent with some mixed results.  She notes that she gets shortness of breath with minimal exertion like walking from her bedroom to the bathroom.  She has issues with diastolic dysfunction and congestive heart failure with preserved left ventricular ejection fraction.  In August she had Lasix decreased which has coincided with increasing shortness of breath.  She noted yesterday that she took a full dose of 40 mg Lasix and felt somewhat better.  Cardiology has ordered an ischemia work-up imaging at [facility]  The patient has not had this done yet she notes lower extremity edema every day.  She also notes some lower extremity pain and tenderness but cannot specify where.  This feels to be generalized.  She notes that when she walks her saturations are remaining between 92 to 93% as she does have an oximeter at home.  No orthopnea or paroxysmal nocturnal dyspnea.  She has had a dry cough has been nonproductive.  Of note she is on an ACE inhibitor.  She has been compliant with CPAP.  He does not endorse any fevers, chills or sweats.   No sputum production or hemoptysis.  She does not voice any other complaint today.  Family history reviewed and is notable for "blood clots" in various members of the family.  Review of Systems A 10 point review of systems was performed and it is as noted above otherwise negative.  Past Medical History:  Diagnosis Date   Acute bronchitis    Anemia    Anxiety and depression    Arthritis    Asthma    B12 deficiency 03/14/2017   Chest pain 07/02/2018   COPD (chronic obstructive pulmonary disease) ([place]) 07/24/2018   Family history of blood clots 07/16/2017   Irritable bowel syndrome    Malignant hypertension    Obesity    OSA (obstructive sleep apnea)    uses cpap   Pneumonia 2017   Restless leg syndrome    Stroke ([place]) 1995   mild   Past Surgical History:  Procedure Laterality Date   CHOLECYSTECTOMY     COLONOSCOPY WITH PROPOFOL N/A 03/31/2018   Procedure: COLONOSCOPY WITH PROPOFOL;  Surgeon: Lucilla Lame, MD;  Location: [facility] ENDOSCOPY;  Service: Endoscopy;  Laterality: N/A;   KNEE ARTHROPLASTY Right 04/30/2019   Procedure: COMPUTER ASSISTED TOTAL KNEE ARTHROPLASTY;  Surgeon: Dereck Leep, MD;  Location: [facility] ORS;  Service: Orthopedics;  Laterality: Right;   saliva gland removal  1998   TOTAL KNEE ARTHROPLASTY Left 2014   TUBAL LIGATION Bilateral    Family  History  Problem Relation Age of Onset   Dementia Mother    COPD Father        71   Diabetes Father    Heart failure Brother    Coronary artery disease Brother 73       CABG   Heart failure Maternal Grandmother    Stroke Maternal Grandmother    Breast cancer Neg Hx    Social History   Tobacco Use   Smoking status: Never   Smokeless tobacco: Never  Substance Use Topics   Alcohol use: No    Alcohol/week: 0.0 standard drinks   Allergies  Allergen Reactions   Nuvigil [Armodafinil] Hives   Penicillins Diarrhea and Nausea And Vomiting    Did it involve swelling of the face/tongue/throat, SOB, or low BP?  no Did it involve sudden or severe rash/hives, skin peeling, or any reaction on the inside of your mouth or nose? No Did you need to seek medical attention at a hospital or doctor's office? No When did it last happen?  in her 62s    If all above answers are "NO", may proceed with cephalosporin use.    Gabapentin Other (See Comments)    "wiped her out, couldn't stay awake"   Provigil [Modafinil] Hives   Current Meds  Medication Sig   acetaminophen (TYLENOL) 500 MG tablet Take 2 tablets (1,000 mg total) by mouth every 8 (eight) hours as needed for moderate pain.   albuterol (ACCUNEB) 1.25 MG/3ML nebulizer solution Take 3 mLs (1.25 mg total) by nebulization every 6 (six) hours as needed for wheezing.   budesonide-formoterol (SYMBICORT) 160-4.5 MCG/ACT inhaler Inhale 2 puffs into the lungs in the morning and at bedtime.   calcium-vitamin D (OSCAL 500/200 D-3) 500-200 MG-UNIT tablet Take 1 tablet by mouth daily with breakfast.   cetirizine (ZYRTEC) 10 MG tablet Take 1 tablet (10 mg total) by mouth daily.   cholestyramine (QUESTRAN) 4 g packet Take 1 packet (4 g total) and drink by mouth at bedtime.   furosemide (LASIX) 20 MG tablet Take 1 tablet (20 mg total) by mouth daily.   Ipratropium-Albuterol (COMBIVENT RESPIMAT) 20-100 MCG/ACT AERS respimat Inhale 1 puff into the lungs every 4 (four) hours as needed for wheezing.   lisinopril (ZESTRIL) 10 MG tablet TAKE 1 TABLET BY MOUTH DAILY.   multivitamin-iron-minerals-folic acid (CENTRUM) chewable tablet Chew 1 tablet by mouth daily.   pantoprazole (PROTONIX) 40 MG tablet TAKE 1 TABLET BY MOUTH DAILY   promethazine (PHENERGAN) 25 MG tablet TAKE 1 TABLET BY MOUTH EVERY 6 HOURS AS NEEDED FOR NAUSEA OR VOMITING.   rOPINIRole (REQUIP) 1 MG tablet Take 1 mg by mouth 3 (three) times daily as needed.   Immunization History  Administered Date(s) Administered   Fluad Quad(high Dose 65+) 02/15/2019, 02/16/2020   Influenza Split 02/23/2018    Influenza-Unspecified 02/16/2014, 01/18/2017   Moderna Sars-Covid-2 Vaccination 01/05/2020, 02/01/2020   Pneumococcal Conjugate-13 05/16/2014   Pneumococcal Polysaccharide-23 03/12/2017, 07/16/2017       Objective:   Physical Exam BP 128/70 (BP Location: Left Arm, Cuff Size: Large)   Pulse 100   Temp 97.6 F (36.4 C) (Temporal)   Ht 5\' 4"  (1.626 m)   Wt (!) 322 lb (146.1 kg)   SpO2 95%   BMI 55.27 kg/m  GENERAL: Morbidly obese woman, appears chronically ill.  No respiratory distress but some mild tachypnea. HEAD: Normocephalic, atraumatic.  EYES: Pupils equal, round, reactive to light.  No scleral icterus.  MOUTH: Oral mucosa moist.  No  thrush. NECK: Supple. No thyromegaly. Trachea midline. No JVD.  No adenopathy. PULMONARY: Good air entry bilaterally.  She is moving air very well she has absolutely no adventitious sounds. CARDIOVASCULAR: S1 and S2.  Mild tachycardia, regular rhythm.  Cannot appreciate murmur due to body habitus. ABDOMEN: Obese otherwise benign. MUSCULOSKELETAL: No joint deformity, no clubbing, he has nonpitting edema of the lower extremities 2+.   NEUROLOGIC: No overt focal deficit. SKIN: Intact,warm,dry. PSYCH: Flat affect, normal behavior.    Assessment & Plan:     ICD-10-CM   1. Shortness of breath  R06.02 D-dimer, quantitative    CBC w/Diff    B Nat Peptide    Renal Function Panel    DG Chest 2 View    NM Pulmonary Perfusion   Concerned that this is noncardiac in origin Will obtain D-dimer, VQ scan, CBC, renal panel Chest x-ray PA and lateral    2. Moderate persistent asthmatic bronchitis without complication  M62.94    I do not believe patient has an exacerbation of this She has not responded to usual management of this issue Does need optimization of her inhalers     3. Congestive heart failure, unspecified HF chronicity, unspecified heart failure type ([place])  I50.9    Encouraged to follow-up with the ischemia work-up as requested by Dr.  Saunders Revel Patient scheduling with [facility]    4. OSA on CPAP  G47.33    Z99.89    Continue CPAP    5. Obesity hypoventilation syndrome (HCC)  E66.2    Weight loss recommended     Meds ordered this encounter  Medications   Budeson-Glycopyrrol-Formoterol (BREZTRI AEROSPHERE) 160-9-4.8 MCG/ACT AERO    Sig: Inhale 2 puffs into the lungs in the morning and at bedtime.    Dispense:  10.7 g    Refill:  3   Orders Placed This Encounter  Procedures   DG Chest 2 View    Standing Status:   Future    Standing Expiration Date:   08/07/2021    Order Specific Question:   Reason for Exam (SYMPTOM  OR DIAGNOSIS REQUIRED)    Answer:   sob    Order Specific Question:   Preferred imaging location?    Answer:   [facility]   NM Pulmonary Perfusion    Standing Status:   Future    Standing Expiration Date:   02/07/2022    Order Specific Question:   If indicated for the ordered procedure, I authorize the administration of a radiopharmaceutical per Radiology protocol    Answer:   Yes    Order Specific Question:   Preferred imaging location?    Answer:   [facility]   D-dimer, quantitative    Standing Status:   Future    Standing Expiration Date:   02/07/2022   CBC w/Diff    Standing Status:   Future    Standing Expiration Date:   02/07/2022   B Nat Peptide    Standing Status:   Future    Standing Expiration Date:   02/07/2022   Renal Function Panel    Standing Status:   Future    Standing Expiration Date:   02/07/2022   I am concerned this patient's dyspnea has not necessarily due to pulmonary issues.  We are obtaining laboratory data as above.  1 possibility could be chronic PE with for which V/Q will be more accurate, in addition patient has known renal insufficiency.  We will optimize her inhaler by Judithann Sauger 2 puffs twice  a day.  Discontinue Symbicort.  Discontinue Combivent.  Continue as needed albuterol.  At present I do not see need for prednisone or repeat antibiotic.  Will await data order  above to better determine treatment.  Patient should be followed up in 2 to 3 weeks time with Dr. Mortimer Fries or nurse practitioner at that time.  Patient had difficulty controlling dry hacking cough.  He is on ACE inhibitor and would recommend that this be switched to ARB if possible.   Total visit time over 45 minutes.  Renold Don, MD Advanced Bronchoscopy PCCM [facility]    *This note was dictated using voice recognition software/Dragon.  Despite best efforts to proofread, errors can occur which can change the meaning.  Any change was purely unintentional.

## 2021-02-07 NOTE — Patient Instructions (Signed)
We are giving you a trial of Breztri 2 puffs twice a day this was called into your pharmacy this should be covered per our records.  You will need to get some blood work drawn today.  You will also get a chest x-ray.  We are also scheduling for a test called a VQ scan that will tell us about potential blood clots in your lung, we are using this test because your kidneys already have some injury to them.  I do not think that your shortness of breath is coming from a primary pulmonary issue at present.  We will call you with the results of your tests as they come back.  If your symptoms worsen please do not hesitate to come to the emergency room to be evaluated.   We will follow you up in 2 to 3 weeks time with the nurse practitioner at that time.

## 2021-02-08 ENCOUNTER — Telehealth: Payer: Self-pay | Admitting: *Deleted

## 2021-02-08 DIAGNOSIS — E876 Hypokalemia: Secondary | ICD-10-CM

## 2021-02-08 MED ORDER — POTASSIUM CHLORIDE ER 10 MEQ PO TBCR: 10.0000 meq | EXTENDED_RELEASE_TABLET | Freq: Every day | ORAL | 0 refills | Status: DC

## 2021-02-08 NOTE — Telephone Encounter (Signed)
Solmon Ice, RN  02/08/2021  1:33 PM EDT Back to Top    Per [place] via secure chat: Take Potassium 10 mEq daily and repeat BMET in 1 week. Patient notified of result.  Please refer to phone note from today for complete details.   Darlyne Russian, RN 02/08/2021 1:33 PM    Solmon Ice, RN  02/08/2021 12:50 PM EDT     Secure chat sent to Tarri Glenn, PA to advise of potassium result.    Solmon Ice, RN  02/08/2021  7:33 AM EDT     Preliminary results reivewed. Forwarded to provider's desktop for review and signature. Potassium 3.4 please advise.    New Rx sent to pharmacy for Potassium 10 mEq daily.  Repeat BMET ordered and scheduled to have drawn in [facility] office 02/15/21.

## 2021-02-08 NOTE — Telephone Encounter (Signed)
Spoke with pt.  She said [facility] did reach her to schedule myocardial PET CT. They wanted to scheduled her in the next 2 weeks and pt states she was unable to schedule that soon. So [facility] will reach back out to her the first of October when their schedule has opened up.  Pt states that she will schedule once [facility] calls her back.

## 2021-02-08 NOTE — Addendum Note (Signed)
Addended by: Claudette Head A on: 02/08/2021 08:09 AM   Modules accepted: Orders

## 2021-02-08 NOTE — Telephone Encounter (Signed)
Patient has been provided with information and [facility] will call patient back to schedule.  [facility] to call patient back to schedule at a later date due to patient not being able to schedule at that time. No further needs at this time.

## 2021-02-12 ENCOUNTER — Other Ambulatory Visit: Payer: Self-pay

## 2021-02-12 ENCOUNTER — Ambulatory Visit
Admission: RE | Admit: 2021-02-12 | Discharge: 2021-02-12 | Disposition: A | Discharge status: Home / Self Care | Payer: Medicare Other | Source: Ambulatory Visit | Attending: Pulmonary Disease | Admitting: Pulmonary Disease

## 2021-02-12 DIAGNOSIS — R0602 Shortness of breath: Secondary | ICD-10-CM | POA: Insufficient documentation

## 2021-02-12 MED ORDER — TECHNETIUM TO 99M ALBUMIN AGGREGATED
3.7900 | Freq: Once | INTRAVENOUS | Status: AC | PRN
  Administered 2021-02-12: 3.79 via INTRAVENOUS

## 2021-02-13 ENCOUNTER — Other Ambulatory Visit: Payer: Self-pay

## 2021-02-13 ENCOUNTER — Telehealth: Payer: Self-pay | Admitting: Pulmonary Disease

## 2021-02-13 ENCOUNTER — Emergency Department: Payer: Medicare Other

## 2021-02-13 ENCOUNTER — Emergency Department
Admission: EM | Admit: 2021-02-13 | Discharge: 2021-02-13 | Disposition: A | Discharge status: Home / Self Care | Payer: Medicare Other | Attending: Emergency Medicine | Admitting: Emergency Medicine

## 2021-02-13 DIAGNOSIS — I110 Hypertensive heart disease with heart failure: Secondary | ICD-10-CM | POA: Diagnosis not present

## 2021-02-13 DIAGNOSIS — Z7951 Long term (current) use of inhaled steroids: Secondary | ICD-10-CM | POA: Diagnosis not present

## 2021-02-13 DIAGNOSIS — Z79899 Other long term (current) drug therapy: Secondary | ICD-10-CM | POA: Diagnosis not present

## 2021-02-13 DIAGNOSIS — Z20822 Contact with and (suspected) exposure to covid-19: Secondary | ICD-10-CM | POA: Diagnosis not present

## 2021-02-13 DIAGNOSIS — R059 Cough, unspecified: Secondary | ICD-10-CM | POA: Diagnosis present

## 2021-02-13 DIAGNOSIS — I509 Heart failure, unspecified: Secondary | ICD-10-CM | POA: Diagnosis not present

## 2021-02-13 DIAGNOSIS — Z96653 Presence of artificial knee joint, bilateral: Secondary | ICD-10-CM | POA: Diagnosis not present

## 2021-02-13 DIAGNOSIS — J45901 Unspecified asthma with (acute) exacerbation: Secondary | ICD-10-CM | POA: Diagnosis not present

## 2021-02-13 DIAGNOSIS — Z85038 Personal history of other malignant neoplasm of large intestine: Secondary | ICD-10-CM | POA: Diagnosis not present

## 2021-02-13 DIAGNOSIS — J449 Chronic obstructive pulmonary disease, unspecified: Secondary | ICD-10-CM | POA: Insufficient documentation

## 2021-02-13 LAB — CBC WITH DIFFERENTIAL/PLATELET
Abs Immature Granulocytes: 0.04 K/uL (ref 0.00–0.07)
Basophils Absolute: 0.1 K/uL (ref 0.0–0.1)
Basophils Relative: 1 %
Eosinophils Absolute: 0.2 K/uL (ref 0.0–0.5)
Eosinophils Relative: 2 %
HCT: 33 % — ABNORMAL LOW (ref 36.0–46.0)
Hemoglobin: 10.5 g/dL — ABNORMAL LOW (ref 12.0–15.0)
Immature Granulocytes: 0 %
Lymphocytes Relative: 18 %
Lymphs Abs: 1.7 K/uL (ref 0.7–4.0)
MCH: 25.5 pg — ABNORMAL LOW (ref 26.0–34.0)
MCHC: 31.8 g/dL (ref 30.0–36.0)
MCV: 80.3 fL (ref 80.0–100.0)
Monocytes Absolute: 0.5 K/uL (ref 0.1–1.0)
Monocytes Relative: 6 %
Neutro Abs: 7 K/uL (ref 1.7–7.7)
Neutrophils Relative %: 73 %
Platelets: 345 K/uL (ref 150–400)
RBC: 4.11 MIL/uL (ref 3.87–5.11)
RDW: 14.6 % (ref 11.5–15.5)
WBC: 9.4 K/uL (ref 4.0–10.5)
nRBC: 0 % (ref 0.0–0.2)

## 2021-02-13 LAB — APTT: aPTT: 30 seconds (ref 24–36)

## 2021-02-13 LAB — COMPREHENSIVE METABOLIC PANEL WITH GFR
ALT: 14 U/L (ref 0–44)
AST: 15 U/L (ref 15–41)
Albumin: 3.3 g/dL — ABNORMAL LOW (ref 3.5–5.0)
Alkaline Phosphatase: 76 U/L (ref 38–126)
Anion gap: 9 (ref 5–15)
BUN: 15 mg/dL (ref 8–23)
CO2: 29 mmol/L (ref 22–32)
Calcium: 9.6 mg/dL (ref 8.9–10.3)
Chloride: 97 mmol/L — ABNORMAL LOW (ref 98–111)
Creatinine, Ser: 1.27 mg/dL — ABNORMAL HIGH (ref 0.44–1.00)
GFR, Estimated: 46 mL/min — ABNORMAL LOW
Glucose, Bld: 117 mg/dL — ABNORMAL HIGH (ref 70–99)
Potassium: 3.9 mmol/L (ref 3.5–5.1)
Sodium: 135 mmol/L (ref 135–145)
Total Bilirubin: 0.6 mg/dL (ref 0.3–1.2)
Total Protein: 7.1 g/dL (ref 6.5–8.1)

## 2021-02-13 LAB — PROTIME-INR
INR: 1.1 (ref 0.8–1.2)
Prothrombin Time: 13.7 seconds (ref 11.4–15.2)

## 2021-02-13 LAB — TROPONIN I (HIGH SENSITIVITY)
Troponin I (High Sensitivity): 7 ng/L
Troponin I (High Sensitivity): 6 ng/L

## 2021-02-13 LAB — RESP PANEL BY RT-PCR (FLU A&B, COVID) ARPGX2
Influenza A by PCR: NEGATIVE
Influenza B by PCR: NEGATIVE
SARS Coronavirus 2 by RT PCR: NEGATIVE

## 2021-02-13 MED ORDER — IPRATROPIUM-ALBUTEROL 0.5-2.5 (3) MG/3ML IN SOLN
9.0000 mL | Freq: Once | RESPIRATORY_TRACT | Status: AC
  Administered 2021-02-13: 9 mL via RESPIRATORY_TRACT
  Filled 2021-02-13: qty 3

## 2021-02-13 MED ORDER — PREDNISONE 10 MG PO TABS: 10.0000 mg | ORAL_TABLET | Freq: Every day | ORAL | 0 refills | Status: AC

## 2021-02-13 MED ORDER — METHYLPREDNISOLONE SODIUM SUCC 125 MG IJ SOLR
125.0000 mg | Freq: Once | INTRAMUSCULAR | Status: AC
  Administered 2021-02-13: 125 mg via INTRAVENOUS
  Filled 2021-02-13: qty 2

## 2021-02-13 MED ORDER — IOHEXOL 350 MG/ML SOLN
75.0000 mL | Freq: Once | INTRAVENOUS | Status: AC | PRN
  Administered 2021-02-13: 75 mL via INTRAVENOUS

## 2021-02-13 MED ORDER — IPRATROPIUM-ALBUTEROL 0.5-2.5 (3) MG/3ML IN SOLN: 3.0000 mL | RESPIRATORY_TRACT | 0 refills | Status: DC | PRN
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
## 2021-02-13 NOTE — Telephone Encounter (Signed)
Patient is aware of below message/recommendations and voiced her understanding. She will report to ED. Nothing further needed at this time.

## 2021-02-13 NOTE — Telephone Encounter (Signed)
Received call report from [facility] with [place] radiology on NM pulmonary perfusion. Results are in epic. Can not exclude PE.  Dr. Mortimer Fries, please advise. Dr. Patsey Berthold is unavailable.

## 2021-02-13 NOTE — ED Triage Notes (Signed)
Pt here with SOB x3 weeks. Pt had a pulmonary perfusion test yesterday and was told to come to the ED to be evaluated. Pt has a cough.

## 2021-02-13 NOTE — ED Provider Notes (Signed)
PA  [facility] Emergency Department Provider Note  ____________________________________________   Event Date/Time   First MD Initiated Contact with Patient 02/13/21 1737     (approximate)  I have reviewed the triage vital signs and the nursing notes.   HISTORY  Chief Complaint Shortness of Breath   HPI Samantha Mason is a 70 y.o. female with a past medical history of anemia, anxiety, arthritis, IBS, HTN, OSA and obesity as well as RLS as an asthma on oxygen as needed who presents for assessment approximate 1 week of worsening shortness of breath cough and wheezing not responding to her typical albuterol inhalers.  Patient states she had an outpatient VQ scan done and that was possibly concerning for PE at her pulmonologist told her to come to emergency room.  States he has had on and off headaches for the last couple days but no new chest pain, abdominal pain pain, back pain, vomiting, diarrhea, rash, recent falls or injuries or any other acute complaints.  She has not been coughing up any blood.  No other acute concerns at this time.  She is not currently on antibiotics or steroids.         Past Medical History:  Diagnosis Date   Acute bronchitis    Anemia    Anxiety and depression    Arthritis    Asthma    B12 deficiency 03/14/2017   Chest pain 07/02/2018   COPD (chronic obstructive pulmonary disease) ([place]) 07/24/2018   Family history of blood clots 07/16/2017   Irritable bowel syndrome    Malignant hypertension    Obesity    OSA (obstructive sleep apnea)    uses cpap   Pneumonia 2017   Restless leg syndrome    Stroke [facility]) 1995   mild    Patient Active Problem List   Diagnosis Date Noted   History of stroke 11/17/2020   Asthma in adult, moderate persistent, with acute exacerbation 08/29/2020   Ovarian failure 02/21/2020   Total knee replacement status 04/30/2019   Primary osteoarthritis of right knee 02/13/2019   S/P total knee  arthroplasty, left 02/13/2019   Aortic atherosclerosis ([place]) 08/01/2018   B12 deficiency anemia 07/07/2018   Benign neoplasm of ascending colon    Positive colorectal cancer screening using Cologuard test 03/13/2018   Severe persistent asthma 02/13/2018   Vitamin D deficiency 03/17/2017   Prediabetes 03/14/2017   Microcytic anemia 03/14/2017   IBS (irritable bowel syndrome) 03/12/2017   Allergic rhinitis 03/12/2017   Restless leg syndrome 03/12/2017   Acid reflux 10/16/2016   Essential hypertension 10/16/2016   CHF (congestive heart failure) ([place]) 12/08/2014   Morbid obesity ([place]) 12/08/2014   OSA on CPAP 12/08/2014   BMI 50.0-59.9, adult ([place]) 05/31/2014   Shortness of breath 04/07/2014    Past Surgical History:  Procedure Laterality Date   CHOLECYSTECTOMY     COLONOSCOPY WITH PROPOFOL N/A 03/31/2018   Procedure: COLONOSCOPY WITH PROPOFOL;  Surgeon: Lucilla Lame, MD;  Location: [facility] ENDOSCOPY;  Service: Endoscopy;  Laterality: N/A;   KNEE ARTHROPLASTY Right 04/30/2019   Procedure: COMPUTER ASSISTED TOTAL KNEE ARTHROPLASTY;  Surgeon: Dereck Leep, MD;  Location: [facility] ORS;  Service: Orthopedics;  Laterality: Right;   saliva gland removal  1998   TOTAL KNEE ARTHROPLASTY Left 2014   TUBAL LIGATION Bilateral     Prior to Admission medications   Medication Sig Start Date End Date Taking? Authorizing Provider  ipratropium-albuterol (DUONEB) 0.5-2.5 (3) MG/3ML SOLN Take 3 mLs by nebulization  every 4 (four) hours as needed for up to 7 days. 02/13/21 02/20/21 Yes Lucrezia Starch, MD  predniSONE (DELTASONE) 10 MG tablet Take 1 tablet (10 mg total) by mouth daily for 4 days. 02/13/21 02/17/21 Yes Lucrezia Starch, MD  acetaminophen (TYLENOL) 500 MG tablet Take 2 tablets (1,000 mg total) by mouth every 8 (eight) hours as needed for moderate pain. 04/09/17   Plonk, [name], MD  albuterol (ACCUNEB) 1.25 MG/3ML nebulizer solution Take 3 mLs (1.25 mg total) by nebulization every 6 (six) hours  as needed for wheezing. 09/22/20   Rai, Vernelle Emerald, MD  Budeson-Glycopyrrol-Formoterol (BREZTRI AEROSPHERE) 160-9-4.8 MCG/ACT AERO Inhale 2 puffs into the lungs in the morning and at bedtime. 02/07/21   Tyler Pita, MD  calcium-vitamin D (OSCAL 500/200 D-3) 500-200 MG-UNIT tablet Take 1 tablet by mouth daily with breakfast.    [provider]  cetirizine (ZYRTEC) 10 MG tablet Take 1 tablet (10 mg total) by mouth daily. 12/07/14   Ashok Norris, MD  cholestyramine (QUESTRAN) 4 g packet Take 1 packet (4 g total) and drink by mouth at bedtime. 07/12/20   Glean Hess, MD  diltiazem (CARDIZEM CD) 180 MG 24 hr capsule Take 1 capsule (180 mg total) by mouth daily. 11/16/20 01/15/21  End, Harrell Gave, MD  furosemide (LASIX) 20 MG tablet Take 1 tablet (20 mg total) by mouth daily. 01/09/21 04/09/21  Furth, Cadence H, PA-C  lisinopril (ZESTRIL) 10 MG tablet TAKE 1 TABLET BY MOUTH DAILY. 07/12/20 07/12/21  Glean Hess, MD  multivitamin-iron-minerals-folic acid (CENTRUM) chewable tablet Chew 1 tablet by mouth daily.    [provider]  pantoprazole (PROTONIX) 40 MG tablet TAKE 1 TABLET BY MOUTH DAILY 09/26/20 09/26/21  Glean Hess, MD  potassium chloride (KLOR-CON) 10 MEQ tablet Take 1 tablet (10 mEq total) by mouth daily. 02/08/21 03/10/21  Furth, Cadence H, PA-C  promethazine (PHENERGAN) 25 MG tablet TAKE 1 TABLET BY MOUTH EVERY 6 HOURS AS NEEDED FOR NAUSEA OR VOMITING. 08/16/20 08/16/21  Glean Hess, MD  rOPINIRole (REQUIP) 1 MG tablet Take 1 mg by mouth 3 (three) times daily as needed.    [provider]    Allergies Nuvigil [armodafinil], Penicillins, Gabapentin, and Provigil [modafinil]  Family History  Problem Relation Age of Onset   Dementia Mother    COPD Father        42   Diabetes Father    Heart failure Brother    Coronary artery disease Brother 57       CABG   Heart failure Maternal Grandmother    Stroke Maternal Grandmother    Breast  cancer Neg Hx     Social History Social History   Tobacco Use   Smoking status: Never   Smokeless tobacco: Never  Vaping Use   Vaping Use: Never used  Substance Use Topics   Alcohol use: No    Alcohol/week: 0.0 standard drinks   Drug use: No    Review of Systems  Review of Systems  Constitutional:  Positive for malaise/fatigue. Negative for chills and fever.  HENT:  Negative for sore throat.   Eyes:  Negative for pain.  Respiratory:  Positive for cough, shortness of breath and wheezing. Negative for stridor.   Cardiovascular:  Positive for orthopnea. Negative for chest pain.  Gastrointestinal:  Negative for vomiting.  Genitourinary:  Negative for dysuria.  Musculoskeletal:  Negative for myalgias.  Skin:  Negative for rash.  Neurological:  Negative for seizures, loss of consciousness  and headaches.  Psychiatric/Behavioral:  Negative for suicidal ideas.   All other systems reviewed and are negative.    ____________________________________________   PHYSICAL EXAM:  VITAL SIGNS: ED Triage Vitals  Enc Vitals Group     BP 02/13/21 1318 (!) 155/63     Pulse Rate 02/13/21 1318 99     Resp 02/13/21 1318 (!) 30     Temp 02/13/21 1318 98.5 F (36.9 C)     Temp Source 02/13/21 1318 Oral     SpO2 02/13/21 1318 (!) 89 %     Weight 02/13/21 1359 (!) 322 lb (146.1 kg)     Height 02/13/21 1359 5\' 4"  (1.626 m)     Head Circumference --      Peak Flow --      Pain Score 02/13/21 1359 6     Pain Loc --      Pain Edu? --      Excl. in [place]? --    Vitals:   02/13/21 1318 02/13/21 1812  BP: (!) 155/63 (!) 153/58  Pulse: 99 83  Resp: (!) 30 18  Temp: 98.5 F (36.9 C)   SpO2: (!) 89% 98%   Physical Exam Vitals and nursing note reviewed.  Constitutional:      General: She is not in acute distress.    Appearance: She is well-developed. She is obese.  HENT:     Head: Normocephalic and atraumatic.     Right Ear: External ear normal.     Left Ear: External ear normal.      Nose: Nose normal.  Eyes:     Conjunctiva/sclera: Conjunctivae normal.  Cardiovascular:     Rate and Rhythm: Normal rate and regular rhythm.     Heart sounds: No murmur heard. Pulmonary:     Effort: Tachypnea and respiratory distress present.     Breath sounds: Decreased breath sounds and wheezing present.  Abdominal:     Palpations: Abdomen is soft.     Tenderness: There is no abdominal tenderness.  Musculoskeletal:     Cervical back: Neck supple.  Skin:    General: Skin is warm and dry.     Capillary Refill: Capillary refill takes less than 2 seconds.  Neurological:     Mental Status: She is alert and oriented to person, place, and time.  Psychiatric:        Mood and Affect: Mood normal.     ____________________________________________   LABS (all labs ordered are listed, but only abnormal results are displayed)  Labs Reviewed  COMPREHENSIVE METABOLIC PANEL - Abnormal; Notable for the following components:      Result Value   Chloride 97 (*)    Glucose, Bld 117 (*)    Creatinine, Ser 1.27 (*)    Albumin 3.3 (*)    GFR, Estimated 46 (*)    All other components within normal limits  CBC WITH DIFFERENTIAL/PLATELET - Abnormal; Notable for the following components:   Hemoglobin 10.5 (*)    HCT 33.0 (*)    MCH 25.5 (*)    All other components within normal limits  RESP PANEL BY RT-PCR (FLU A&B, COVID) ARPGX2  PROTIME-INR  APTT  TROPONIN I (HIGH SENSITIVITY)  TROPONIN I (HIGH SENSITIVITY)   ____________________________________________  EKG  ECG shows normal sinus rhythm with a ventricular rate of 89 and some artifact in lead II and lead III without other clear evidence of acute ischemia or significant arrhythmia. ____________________________________________  RADIOLOGY  ED MD interpretation: CTA chest shows no  evidence of PE, aneurysm, significant pericardial effusion, pleural consolidation, effusion, edema, pneumothorax or other clear acute process but there is  evidence of some mosaicism concerning for likely small airway disease.   Official radiology report(s): CT Angio Chest PE W and/or Wo Contrast  Result Date: 02/13/2021 CLINICAL DATA:  Possible abnormal pulmonary perfusion test cough short of breath EXAM: CT ANGIOGRAPHY CHEST WITH CONTRAST TECHNIQUE: Multidetector CT imaging of the chest was performed using the standard protocol during bolus administration of intravenous contrast. Multiplanar CT image reconstructions and MIPs were obtained to evaluate the vascular anatomy. CONTRAST:  7mL OMNIPAQUE IOHEXOL 350 MG/ML SOLN COMPARISON:  Perfusion study 02/12/2021, chest x-ray 02/12/2021 CT chest 08/29/2020 FINDINGS: Cardiovascular: Satisfactory opacification of the pulmonary arteries to the segmental level. No evidence of pulmonary embolism. Nonaneurysmal aorta. No dissection seen. Borderline cardiomegaly. No pericardial effusion Mediastinum/Nodes: No enlarged mediastinal, hilar, or axillary lymph nodes. Thyroid gland, trachea, and esophagus demonstrate no significant findings. Lungs/Pleura: No consolidation, pleural effusion or pneumothorax. Mild mosaic attenuation likely due to small airways disease. Upper Abdomen: No acute abnormality. Musculoskeletal: Degenerative changes. No acute osseous abnormality. Review of the MIP images confirms the above findings. IMPRESSION: 1. Negative for acute pulmonary embolus. 2. Negative for consolidative pneumonia. Mild mosaicism, suspect that this is secondary to small airways disease Electronically Signed   By: Donavan Foil M.D.   On: 02/13/2021 15:40    ____________________________________________   PROCEDURES  Procedure(s) performed (including Critical Care):  Procedures   ____________________________________________   INITIAL IMPRESSION / ASSESSMENT AND PLAN / ED COURSE     Patient presents with above-stated history exam persistent worsening cough and shortness of breath of the last week not responding to  albuterol inhaler.  She states she is on 2 L of oxygen which she has been using at home and that she has this as a prescription as needed.  On arrival her room air sat is 89% which improved back to 99% on 2 L.  She does have diminished breath sounds on the next per wheezing on exam.  She is is been noted to be tachypneic with otherwise stable vital signs.  Differential clues acute asthma exacerbation, PE given nonspecific findings on recent VQ scan, bacterial pneumonia, CHF arrhythmia and anemia.  CMP has no significant electrolyte or metabolic derangements. ECG and troponin x2 are not suggestive of ACS or myocarditis there is no evidence of significant arrhythmia.  COVID influenza PCR is negative.  CBC shows no leukocytosis or significant acute anemia.  CTA chest shows no evidence of PE, aneurysm, significant pericardial effusion, pleural consolidation, effusion, edema, pneumothorax or other clear acute process but there is evidence of some mosaicism concerning for likely small airway disease.   Impression is likely asthma exacerbation.  Patient given Solu-Medrol and DuoNeb treatment.  On reassessment she was feeling much better and had improvement in work of breathing as well as decrease in wheezing.  She is still on her 2 L.  Given stable vitals with no increased oxygen requirement from what she has been prescribed at home with patient stating she is feeling better I think she is stable for discharge with close outpatient pulmonology follow-up.  He states DuoNeb's feel much better for her than albuterol and so she was given a nebulized prescription for this as well as a short course of prednisone.  Discharged stable condition.  Strict return precautions advised and discussed.     ____________________________________________   FINAL CLINICAL IMPRESSION(S) / ED DIAGNOSES  Final diagnoses:  Exacerbation of asthma,  unspecified asthma severity, unspecified whether persistent    Medications   iohexol (OMNIPAQUE) 350 MG/ML injection 75 mL (75 mLs Intravenous Contrast Given 02/13/21 1507)  ipratropium-albuterol (DUONEB) 0.5-2.5 (3) MG/3ML nebulizer solution 9 mL (9 mLs Nebulization Given 02/13/21 1816)  methylPREDNISolone sodium succinate (SOLU-MEDROL) 125 mg/2 mL injection 125 mg (125 mg Intravenous Given 02/13/21 1803)     ED Discharge Orders          Ordered    ipratropium-albuterol (DUONEB) 0.5-2.5 (3) MG/3ML SOLN  Every 4 hours PRN        02/13/21 1914    predniSONE (DELTASONE) 10 MG tablet  Daily        02/13/21 1914             Note:  This document was prepared using Dragon voice recognition software and may include unintentional dictation errors.    Lucrezia Starch, MD 02/13/21 [phone]

## 2021-02-14 ENCOUNTER — Telehealth: Payer: Self-pay | Admitting: Pulmonary Disease

## 2021-02-14 ENCOUNTER — Telehealth: Payer: Self-pay | Admitting: *Deleted

## 2021-02-14 NOTE — Telephone Encounter (Signed)
Thank you.  She is actually Dr. Zoila Shutter patient.  I saw her as work-in.

## 2021-02-14 NOTE — Telephone Encounter (Signed)
-----   Message from Solmon Ice, RN sent at 01/30/2021 11:51 AM EDT -----  ----- Message ----- From: Clarisse Gouge Sent: 01/25/2021   3:32 PM EDT To: Solmon Ice, RN  [facility]

## 2021-02-14 NOTE — Telephone Encounter (Signed)
Report received via fax from [facility] and sent to Dr. Saunders Revel and C. Kathlen Mody.

## 2021-02-15 ENCOUNTER — Other Ambulatory Visit: Payer: Medicare Other

## 2021-02-15 ENCOUNTER — Telehealth: Payer: Self-pay | Admitting: Internal Medicine

## 2021-02-15 MED ORDER — IPRATROPIUM-ALBUTEROL 0.5-2.5 (3) MG/3ML IN SOLN: 3.0000 mL | RESPIRATORY_TRACT | 1 refills | Status: DC | PRN

## 2021-02-15 NOTE — Telephone Encounter (Signed)
Patient is requesting a refill on duoneb. This was prescribed during ED visit.   Dr. Mortimer Fries please advise. Thanks

## 2021-02-15 NOTE — Telephone Encounter (Signed)
Patient calling to see if labs are still needed today was seen at ED 02/13/21, please advise

## 2021-02-15 NOTE — Telephone Encounter (Signed)
Left detailed message for patient making her aware that we are currently awaiting a response from Dr. Mortimer Fries.

## 2021-02-15 NOTE — Telephone Encounter (Signed)
Called over to [facility] imaging to follow up regarding patients pharmacological myocardial PET/CT. Lauren there in [facility] imaging states patient requested call back the second week in October in order to schedule this test. She states they have her in their system to call at that time.

## 2021-02-15 NOTE — Telephone Encounter (Signed)
Called [facility] to follow up if they have been able to schedule patient for this test. Will reach out to patient again to see if she can call to get this done.

## 2021-02-15 NOTE — Telephone Encounter (Signed)
Noted.  Will close encounter.  

## 2021-02-15 NOTE — Telephone Encounter (Signed)
Spoke with patient and she has called [facility] and they are supposed to call her sometime in October in order to schedule this testing. Advised that if she needed any assistance or if she does not hear from them to please call us back. I will also call to make sure they will call her next month as well. No further questions at this time.

## 2021-02-15 NOTE — Telephone Encounter (Signed)
Rx has been sent to preferred pharmacy.  Left message for patient.

## 2021-02-15 NOTE — Telephone Encounter (Signed)
Spoke with patient and reviewed that she did not need the lab for today per provider. She verbalized understanding with no further questions.   In a separate encounter I inquired if she had scheduled or spoke with [facility] about her testing. She states that [facility] is supposed to call her back in October to schedule. Will update that note and provider as well.    02/15/2021 Verbal discussion  Spoke with Cadence Kathlen Mody PA-C regarding repeat labs. Reviewed that patient had some done 2 days ago and wanted to see if she still wanted repeat labs. Verbal order was that patient did not need repeat labs that she is scheduled for today. Will review with scheduling to cancel appointment.

## 2021-02-16 NOTE — Telephone Encounter (Deleted)
Patient seen in office today with provider.

## 2021-02-18 NOTE — Progress Notes (Signed)
Date:  02/19/2021   Name:  CAMAURI FLEECE   DOB:  06-14-1950   MRN:  [id number]   Chief Complaint: Annual Exam (Breast exam no pap ) and Flu Vaccine MICKI CASSEL is a 70 y.o. female who presents today for her Complete Annual Exam. She feels well. She reports exercising none. She reports she is sleeping well. Breast complaints none. She was in the ED last week for a COPD exacerbation.  Extensive labs were done.  Mammogram: 05/2020 DEXA: 05/2020 normal/mild osteopenia hip Pap smear: discontinued Colonoscopy: 03/2018 repeat 2024  Immunization History  Administered Date(s) Administered   Fluad Quad(high Dose 65+) 02/15/2019, 02/16/2020, 02/19/2021   Influenza Split 02/23/2018   Influenza-Unspecified 02/16/2014, 01/18/2017   Moderna Sars-Covid-2 Vaccination 01/05/2020, 02/01/2020   Pneumococcal Conjugate-13 05/16/2014   Pneumococcal Polysaccharide-23 03/12/2017, 07/16/2017    Hypertension This is a chronic problem. The problem is controlled. Associated symptoms include shortness of breath. Pertinent negatives include no chest pain, headaches or palpitations. Past treatments include calcium channel blockers, ACE inhibitors and diuretics. The current treatment provides significant improvement. There are no compliance problems.  There is no history of kidney disease or CVA. chronic SOB may be related to CAD - currently being worked up.  Gastroesophageal Reflux She complains of heartburn. She reports no abdominal pain, no chest pain, no coughing or no wheezing. This is a recurrent problem. The problem occurs occasionally. The problem has been unchanged. Associated symptoms include fatigue. Risk factors include obesity and lack of exercise.  Asthma She complains of chest tightness, difficulty breathing and shortness of breath. There is no cough or wheezing. This is a chronic problem. The problem has been gradually worsening. Associated symptoms include heartburn. Pertinent negatives  include no chest pain, fever, headaches or trouble swallowing. Her symptoms are aggravated by any activity. Her symptoms are alleviated by ipratropium, leukotriene antagonist, steroid inhaler and beta-agonist. She reports minimal improvement on treatment. Her past medical history is significant for asthma.   Lab Results  Component Value Date   CREATININE 1.27 (H) 02/13/2021   BUN 15 02/13/2021   NA 135 02/13/2021   K 3.9 02/13/2021   CL 97 (L) 02/13/2021   CO2 29 02/13/2021   Lab Results  Component Value Date   CHOL 168 02/07/2021   HDL 59 02/07/2021   LDLCALC 92 02/07/2021   TRIG 87 02/07/2021   CHOLHDL 2.8 02/07/2021   Lab Results  Component Value Date   TSH 0.488 09/20/2020   Lab Results  Component Value Date   HGBA1C 5.7 (H) 02/16/2020   Lab Results  Component Value Date   WBC 9.4 02/13/2021   HGB 10.5 (L) 02/13/2021   HCT 33.0 (L) 02/13/2021   MCV 80.3 02/13/2021   PLT 345 02/13/2021   Lab Results  Component Value Date   ALT 14 02/13/2021   AST 15 02/13/2021   ALKPHOS 76 02/13/2021   BILITOT 0.6 02/13/2021  Last vitamin D Lab Results  Component Value Date   VD25OH 28.89 (L) 02/16/2020   Lab Results  Component Value Date   VITAMINB12 476 09/20/2020      Review of Systems  Constitutional:  Positive for fatigue. Negative for chills and fever.  HENT:  Negative for congestion, hearing loss, tinnitus, trouble swallowing and voice change.   Eyes:  Negative for visual disturbance.  Respiratory:  Positive for shortness of breath. Negative for cough, chest tightness and wheezing.   Cardiovascular:  Negative for chest pain, palpitations and leg swelling.  Gastrointestinal:  Positive for heartburn. Negative for abdominal pain, constipation, diarrhea and vomiting.  Endocrine: Negative for polydipsia and polyuria.  Genitourinary:  Negative for dysuria, frequency, genital sores, vaginal bleeding and vaginal discharge.  Musculoskeletal:  Negative for arthralgias,  gait problem and joint swelling.  Skin:  Negative for color change and rash.  Neurological:  Negative for dizziness, tremors, light-headedness and headaches.  Hematological:  Negative for adenopathy. Does not bruise/bleed easily.  Psychiatric/Behavioral:  Negative for dysphoric mood and sleep disturbance. The patient is not nervous/anxious.    Patient Active Problem List   Diagnosis Date Noted   History of stroke 11/17/2020   Ovarian failure 02/21/2020   Primary osteoarthritis of right knee 02/13/2019   S/P total knee arthroplasty, left 02/13/2019   Aortic atherosclerosis ([place]) 08/01/2018   B12 deficiency anemia 07/07/2018   Benign neoplasm of ascending colon    Severe persistent asthma 02/13/2018   Vitamin D deficiency 03/17/2017   Prediabetes 03/14/2017   Microcytic anemia 03/14/2017   IBS (irritable bowel syndrome) 03/12/2017   Allergic rhinitis 03/12/2017   Restless leg syndrome 03/12/2017   GERD (gastroesophageal reflux disease) 10/16/2016   Essential hypertension 10/16/2016   CHF (congestive heart failure) ([place]) 12/08/2014   Morbid obesity ([place]) 12/08/2014   OSA on CPAP 12/08/2014   Shortness of breath 04/07/2014    Allergies  Allergen Reactions   Nuvigil [Armodafinil] Hives   Penicillins Diarrhea and Nausea And Vomiting    Did it involve swelling of the face/tongue/throat, SOB, or low BP? no Did it involve sudden or severe rash/hives, skin peeling, or any reaction on the inside of your mouth or nose? No Did you need to seek medical attention at a hospital or doctor's office? No When did it last happen?  in her 97s    If all above answers are "NO", may proceed with cephalosporin use.    Gabapentin Other (See Comments)    "wiped her out, couldn't stay awake"   Provigil [Modafinil] Hives    Past Surgical History:  Procedure Laterality Date   CHOLECYSTECTOMY     COLONOSCOPY WITH PROPOFOL N/A 03/31/2018   Procedure: COLONOSCOPY WITH PROPOFOL;  Surgeon: Lucilla Lame,  MD;  Location: [facility] ENDOSCOPY;  Service: Endoscopy;  Laterality: N/A;   KNEE ARTHROPLASTY Right 04/30/2019   Procedure: COMPUTER ASSISTED TOTAL KNEE ARTHROPLASTY;  Surgeon: Dereck Leep, MD;  Location: [facility] ORS;  Service: Orthopedics;  Laterality: Right;   saliva gland removal  1998   TOTAL KNEE ARTHROPLASTY Left 2014   TUBAL LIGATION Bilateral     Social History   Tobacco Use   Smoking status: Never   Smokeless tobacco: Never  Vaping Use   Vaping Use: Never used  Substance Use Topics   Alcohol use: No    Alcohol/week: 0.0 standard drinks   Drug use: No     Medication list has been reviewed and updated.  Current Meds  Medication Sig   Budeson-Glycopyrrol-Formoterol (BREZTRI AEROSPHERE) 160-9-4.8 MCG/ACT AERO Inhale 2 puffs into the lungs in the morning and at bedtime.   calcium-vitamin D (OSCAL 500/200 D-3) 500-200 MG-UNIT tablet Take 1 tablet by mouth daily with breakfast.   cetirizine (ZYRTEC) 10 MG tablet Take 1 tablet (10 mg total) by mouth daily.   cholestyramine (QUESTRAN) 4 g packet Take 1 packet (4 g total) and drink by mouth at bedtime.   diltiazem (CARDIZEM CD) 180 MG 24 hr capsule Take 1 capsule (180 mg total) by mouth daily.   furosemide (LASIX) 20 MG tablet Take  1 tablet (20 mg total) by mouth daily.   ipratropium-albuterol (DUONEB) 0.5-2.5 (3) MG/3ML SOLN Take 3 mLs by nebulization every 4 (four) hours as needed.   lisinopril (ZESTRIL) 10 MG tablet TAKE 1 TABLET BY MOUTH DAILY.   multivitamin-iron-minerals-folic acid (CENTRUM) chewable tablet Chew 1 tablet by mouth daily.   pantoprazole (PROTONIX) 40 MG tablet TAKE 1 TABLET BY MOUTH DAILY   potassium chloride (KLOR-CON) 10 MEQ tablet Take 1 tablet (10 mEq total) by mouth daily.   promethazine (PHENERGAN) 25 MG tablet TAKE 1 TABLET BY MOUTH EVERY 6 HOURS AS NEEDED FOR NAUSEA OR VOMITING.   rOPINIRole (REQUIP) 1 MG tablet Take 1 mg by mouth 3 (three) times daily as needed.    PHQ 2/9 Scores 02/19/2021 10/05/2020  02/16/2020 02/15/2019  PHQ - 2 Score 0 0 0 0  PHQ- 9 Score 0 3 0 -    GAD 7 : Generalized Anxiety Score 02/19/2021 10/05/2020 02/16/2020  Nervous, Anxious, on Edge 0 0 0  Control/stop worrying 0 0 0  Worry too much - different things 0 0 0  Trouble relaxing 0 0 0  Restless 0 0 0  Easily annoyed or irritable 0 0 0  Afraid - awful might happen 0 0 0  Total GAD 7 Score 0 0 0  Anxiety Difficulty - - Not difficult at all    BP Readings from Last 3 Encounters:  02/19/21 138/88  02/13/21 (!) 152/49  02/07/21 128/70    Physical Exam Vitals and nursing note reviewed.  Constitutional:      General: She is not in acute distress.    Appearance: She is well-developed. She is obese.  HENT:     Head: Normocephalic and atraumatic.     Right Ear: Tympanic membrane and ear canal normal.     Left Ear: Tympanic membrane and ear canal normal.     Nose:     Right Sinus: No maxillary sinus tenderness.     Left Sinus: No maxillary sinus tenderness.  Eyes:     General: No scleral icterus.       Right eye: No discharge.        Left eye: No discharge.     Conjunctiva/sclera: Conjunctivae normal.  Neck:     Thyroid: No thyromegaly.     Vascular: No carotid bruit.  Cardiovascular:     Rate and Rhythm: Normal rate and regular rhythm.     Pulses: Normal pulses.     Heart sounds: Normal heart sounds.  Pulmonary:     Effort: Pulmonary effort is normal. No respiratory distress.     Breath sounds: Normal breath sounds. No wheezing.  Chest:  Breasts:    Right: No mass, nipple discharge, skin change or tenderness.     Left: No mass, nipple discharge, skin change or tenderness.  Abdominal:     General: Bowel sounds are normal.     Palpations: Abdomen is soft. There is no mass.     Tenderness: There is no abdominal tenderness.     Hernia: No hernia is present.  Musculoskeletal:     Cervical back: Normal range of motion. No erythema.     Right lower leg: No edema.     Left lower leg: No edema.   Lymphadenopathy:     Cervical: No cervical adenopathy.  Skin:    General: Skin is warm and dry.     Capillary Refill: Capillary refill takes less than 2 seconds.     Findings: No rash.  Neurological:  General: No focal deficit present.     Mental Status: She is alert and oriented to person, place, and time.     Cranial Nerves: No cranial nerve deficit.     Sensory: No sensory deficit.     Deep Tendon Reflexes: Reflexes are normal and symmetric.  Psychiatric:        Attention and Perception: Attention normal.        Mood and Affect: Mood normal.    Wt Readings from Last 3 Encounters:  02/19/21 (!) 329 lb (149.2 kg)  02/13/21 (!) 322 lb (146.1 kg)  02/07/21 (!) 322 lb (146.1 kg)    BP 138/88   Pulse 97   Temp 98.1 F (36.7 C) (Oral)   Ht 5\' 4"  (1.626 m)   Wt (!) 329 lb (149.2 kg)   SpO2 94%   BMI 56.47 kg/m   Assessment and Plan: 1. Essential hypertension Clinically stable exam with well controlled BP. Tolerating medications without side effects at this time. Currently being worked up by Cardiology for SOB - POCT urinalysis dipstick  2. Annual physical exam Up to date on screenings and immunizations  3. Encounter for screening mammogram for breast cancer Schedule at [facility] - MM 3D SCREEN BREAST BILATERAL  4. Gastroesophageal reflux disease with esophagitis without hemorrhage Symptoms well controlled on daily PPI No red flag signs such as weight loss, n/v, melena Will continue.  5. Prediabetes Check labs and advise - Hemoglobin A1c  6. Vitamin D deficiency Continue supplementation - VITAMIN D 25 Hydroxy (Vit-D Deficiency, Fractures)  7. Aortic atherosclerosis ([place]) Could not afford Lipitor - probably due to the wrong supplement  8. Severe persistent asthma, unspecified whether complicated Continue current regimen of Breztri and Duonebs  9. Need for immunization against influenza - Flu Vaccine QUAD High Dose(Fluad)   Partially dictated using Radio producer. Any errors are unintentional.  Halina Maidens, MD [facility]  02/19/2021

## 2021-02-19 ENCOUNTER — Other Ambulatory Visit: Payer: Self-pay

## 2021-02-19 ENCOUNTER — Other Ambulatory Visit
Admission: RE | Admit: 2021-02-19 | Discharge: 2021-02-19 | Disposition: A | Discharge status: Home / Self Care | Payer: Medicare Other | Attending: Internal Medicine | Admitting: Internal Medicine

## 2021-02-19 ENCOUNTER — Ambulatory Visit (INDEPENDENT_AMBULATORY_CARE_PROVIDER_SITE_OTHER): Payer: Medicare Other | Admitting: Internal Medicine

## 2021-02-19 ENCOUNTER — Encounter: Payer: Self-pay | Admitting: Internal Medicine

## 2021-02-19 ENCOUNTER — Other Ambulatory Visit: Payer: Self-pay | Admitting: Internal Medicine

## 2021-02-19 VITALS — BP 138/88 | HR 97 | Temp 98.1°F | Ht 64.0 in | Wt 329.0 lb

## 2021-02-19 DIAGNOSIS — E559 Vitamin D deficiency, unspecified: Secondary | ICD-10-CM

## 2021-02-19 DIAGNOSIS — R7303 Prediabetes: Secondary | ICD-10-CM | POA: Insufficient documentation

## 2021-02-19 DIAGNOSIS — I700 Atherosclerosis of aorta: Secondary | ICD-10-CM

## 2021-02-19 DIAGNOSIS — Z1231 Encounter for screening mammogram for malignant neoplasm of breast: Secondary | ICD-10-CM

## 2021-02-19 DIAGNOSIS — K2100 Gastro-esophageal reflux disease with esophagitis, without bleeding: Secondary | ICD-10-CM | POA: Diagnosis not present

## 2021-02-19 DIAGNOSIS — Z0000 Encounter for general adult medical examination without abnormal findings: Secondary | ICD-10-CM

## 2021-02-19 DIAGNOSIS — J4550 Severe persistent asthma, uncomplicated: Secondary | ICD-10-CM

## 2021-02-19 DIAGNOSIS — Z23 Encounter for immunization: Secondary | ICD-10-CM

## 2021-02-19 DIAGNOSIS — I10 Essential (primary) hypertension: Secondary | ICD-10-CM | POA: Diagnosis not present

## 2021-02-19 LAB — HEMOGLOBIN A1C
Hgb A1c MFr Bld: 6.2 % — ABNORMAL HIGH (ref 4.8–5.6)
Mean Plasma Glucose: 131.24 mg/dL

## 2021-02-19 LAB — POCT URINALYSIS DIPSTICK
Bilirubin, UA: NEGATIVE
Glucose, UA: NEGATIVE
Ketones, UA: NEGATIVE
Leukocytes, UA: NEGATIVE
Nitrite, UA: NEGATIVE
Protein, UA: NEGATIVE
Spec Grav, UA: 1.015 (ref 1.010–1.025)
Urobilinogen, UA: 0.2 E.U./dL
pH, UA: 5 (ref 5.0–8.0)

## 2021-02-19 LAB — VITAMIN B12: Vitamin B-12: 244 pg/mL (ref 180–914)

## 2021-02-19 NOTE — Telephone Encounter (Signed)
Medication Refill - Medication: requip 1 mg. Pt states she takes up to 3 times a day  Has the patient contacted their pharmacy? Yes.  Pt [facility] rx and [facility] to [facility] and [facility]  Preferred Pharm[facility] (with phone number [facility] [place] [address] in [place] the patient been seen for an appointment in the last year OR does the patient have an upcoming appointment? Yes.  Pt seen dr berglund today  Agent: Please be advised that RX refills may take up to 3 business days. We ask that you follow-up with your pharmacy.

## 2021-02-20 MED ORDER — ROPINIROLE HCL 1 MG PO TABS
1.0000 mg | ORAL_TABLET | Freq: Three times a day (TID) | ORAL | 0 refills | Status: DC | PRN
Start: 2021-02-20 — End: 2021-03-16

## 2021-02-20 NOTE — Telephone Encounter (Signed)
Medication refill

## 2021-02-20 NOTE — Telephone Encounter (Signed)
   Requested medications are on the active medication list yes   Last visit 02/19/21  Future visit scheduled 02/21/22  Notes to clinic This was ordered by a historical provider, please assess.

## 2021-02-22 ENCOUNTER — Ambulatory Visit: Payer: Medicare Other

## 2021-02-26 ENCOUNTER — Other Ambulatory Visit: Payer: Self-pay | Admitting: Internal Medicine

## 2021-02-26 NOTE — Telephone Encounter (Signed)
    Medication Refill - Medication: promethazine (PHENERGAN) 25 MG tablet  Has the patient contacted their pharmacy? No   (Agent: If no, request that the patient contact the pharmacy for the refill.) Patient states she has switched between several pharmacies and this is a new pharmacy and decided to call PCP because [facility].    Preferred Pharmacy (with phone number or street name):   [id number] [place] [place] Phone:  [phone]  Fax:  [phone]     Has the patient been seen for an appointment in the last year OR does the patient have an upcoming appointment? Yes   Agent: Please be advised that RX refills may take up to 3 business days. We ask that you follow-up with your pharmacy.     [id number] [address], [place] Phone:  [phone]  Fax:  [phone]

## 2021-02-27 MED ORDER — PROMETHAZINE HCL 25 MG PO TABS: ORAL_TABLET | Freq: Four times a day (QID) | ORAL | 0 refills | Status: DC | PRN

## 2021-02-27 NOTE — Telephone Encounter (Signed)
Requested medications are on the active medication list yes  Last visit 02/19/21  Future visit scheduled 02/21/22  Notes to clinic This medication can not be delegated, please assess.

## 2021-03-02 ENCOUNTER — Telehealth: Payer: Self-pay

## 2021-03-02 NOTE — Telephone Encounter (Signed)
Called and LVM for patient in regards to if she was still using her cpap.

## 2021-03-02 NOTE — Telephone Encounter (Signed)
Pt's old CPAP machine is currently not working and the new machine she feels as it is suffocating her.

## 2021-03-04 ENCOUNTER — Other Ambulatory Visit: Payer: Self-pay | Admitting: Medical

## 2021-03-05 ENCOUNTER — Encounter: Payer: Self-pay | Admitting: Primary Care

## 2021-03-05 ENCOUNTER — Other Ambulatory Visit: Payer: Self-pay

## 2021-03-05 ENCOUNTER — Emergency Department: Payer: Medicare Other

## 2021-03-05 ENCOUNTER — Inpatient Hospital Stay
Admission: EM | Admit: 2021-03-05 | Discharge: 2021-03-10 | DRG: 202 | Disposition: A | Discharge status: Home / Self Care | Payer: Medicare Other | Source: Ambulatory Visit | Attending: Internal Medicine | Admitting: Internal Medicine

## 2021-03-05 ENCOUNTER — Ambulatory Visit (INDEPENDENT_AMBULATORY_CARE_PROVIDER_SITE_OTHER): Payer: Medicare Other | Admitting: Primary Care

## 2021-03-05 DIAGNOSIS — E861 Hypovolemia: Secondary | ICD-10-CM | POA: Diagnosis not present

## 2021-03-05 DIAGNOSIS — R0602 Shortness of breath: Secondary | ICD-10-CM | POA: Diagnosis not present

## 2021-03-05 DIAGNOSIS — Z8249 Family history of ischemic heart disease and other diseases of the circulatory system: Secondary | ICD-10-CM

## 2021-03-05 DIAGNOSIS — J309 Allergic rhinitis, unspecified: Secondary | ICD-10-CM | POA: Diagnosis present

## 2021-03-05 DIAGNOSIS — G2581 Restless legs syndrome: Secondary | ICD-10-CM | POA: Diagnosis present

## 2021-03-05 DIAGNOSIS — I5033 Acute on chronic diastolic (congestive) heart failure: Secondary | ICD-10-CM | POA: Diagnosis present

## 2021-03-05 DIAGNOSIS — Z823 Family history of stroke: Secondary | ICD-10-CM

## 2021-03-05 DIAGNOSIS — J4551 Severe persistent asthma with (acute) exacerbation: Principal | ICD-10-CM | POA: Diagnosis present

## 2021-03-05 DIAGNOSIS — I5032 Chronic diastolic (congestive) heart failure: Secondary | ICD-10-CM | POA: Diagnosis present

## 2021-03-05 DIAGNOSIS — R0902 Hypoxemia: Secondary | ICD-10-CM

## 2021-03-05 DIAGNOSIS — R0600 Dyspnea, unspecified: Secondary | ICD-10-CM

## 2021-03-05 DIAGNOSIS — E860 Dehydration: Secondary | ICD-10-CM | POA: Diagnosis not present

## 2021-03-05 DIAGNOSIS — G4733 Obstructive sleep apnea (adult) (pediatric): Secondary | ICD-10-CM

## 2021-03-05 DIAGNOSIS — M1990 Unspecified osteoarthritis, unspecified site: Secondary | ICD-10-CM | POA: Diagnosis present

## 2021-03-05 DIAGNOSIS — E871 Hypo-osmolality and hyponatremia: Secondary | ICD-10-CM | POA: Diagnosis not present

## 2021-03-05 DIAGNOSIS — K219 Gastro-esophageal reflux disease without esophagitis: Secondary | ICD-10-CM | POA: Diagnosis present

## 2021-03-05 DIAGNOSIS — K580 Irritable bowel syndrome with diarrhea: Secondary | ICD-10-CM | POA: Diagnosis present

## 2021-03-05 DIAGNOSIS — I700 Atherosclerosis of aorta: Secondary | ICD-10-CM | POA: Diagnosis present

## 2021-03-05 DIAGNOSIS — J9601 Acute respiratory failure with hypoxia: Secondary | ICD-10-CM | POA: Diagnosis present

## 2021-03-05 DIAGNOSIS — Z20822 Contact with and (suspected) exposure to covid-19: Secondary | ICD-10-CM | POA: Diagnosis present

## 2021-03-05 DIAGNOSIS — Z8701 Personal history of pneumonia (recurrent): Secondary | ICD-10-CM

## 2021-03-05 DIAGNOSIS — Z880 Allergy status to penicillin: Secondary | ICD-10-CM

## 2021-03-05 DIAGNOSIS — I110 Hypertensive heart disease with heart failure: Secondary | ICD-10-CM | POA: Diagnosis present

## 2021-03-05 DIAGNOSIS — Z9851 Tubal ligation status: Secondary | ICD-10-CM

## 2021-03-05 DIAGNOSIS — Z8673 Personal history of transient ischemic attack (TIA), and cerebral infarction without residual deficits: Secondary | ICD-10-CM

## 2021-03-05 DIAGNOSIS — Z6841 Body Mass Index (BMI) 40.0 and over, adult: Secondary | ICD-10-CM

## 2021-03-05 DIAGNOSIS — E662 Morbid (severe) obesity with alveolar hypoventilation: Secondary | ICD-10-CM | POA: Diagnosis present

## 2021-03-05 DIAGNOSIS — N179 Acute kidney failure, unspecified: Secondary | ICD-10-CM | POA: Diagnosis not present

## 2021-03-05 DIAGNOSIS — Z9989 Dependence on other enabling machines and devices: Secondary | ICD-10-CM

## 2021-03-05 DIAGNOSIS — D649 Anemia, unspecified: Secondary | ICD-10-CM | POA: Diagnosis present

## 2021-03-05 DIAGNOSIS — J441 Chronic obstructive pulmonary disease with (acute) exacerbation: Principal | ICD-10-CM

## 2021-03-05 DIAGNOSIS — J4550 Severe persistent asthma, uncomplicated: Secondary | ICD-10-CM | POA: Diagnosis not present

## 2021-03-05 DIAGNOSIS — J45901 Unspecified asthma with (acute) exacerbation: Secondary | ICD-10-CM | POA: Diagnosis present

## 2021-03-05 DIAGNOSIS — Z888 Allergy status to other drugs, medicaments and biological substances status: Secondary | ICD-10-CM

## 2021-03-05 DIAGNOSIS — K589 Irritable bowel syndrome without diarrhea: Secondary | ICD-10-CM | POA: Diagnosis present

## 2021-03-05 DIAGNOSIS — Z825 Family history of asthma and other chronic lower respiratory diseases: Secondary | ICD-10-CM

## 2021-03-05 DIAGNOSIS — Z79899 Other long term (current) drug therapy: Secondary | ICD-10-CM

## 2021-03-05 DIAGNOSIS — I10 Essential (primary) hypertension: Secondary | ICD-10-CM | POA: Diagnosis present

## 2021-03-05 LAB — CBC
HCT: 34.2 % — ABNORMAL LOW (ref 36.0–46.0)
Hemoglobin: 10.6 g/dL — ABNORMAL LOW (ref 12.0–15.0)
MCH: 24.8 pg — ABNORMAL LOW (ref 26.0–34.0)
MCHC: 31 g/dL (ref 30.0–36.0)
MCV: 79.9 fL — ABNORMAL LOW (ref 80.0–100.0)
Platelets: 297 10*3/uL (ref 150–400)
RBC: 4.28 MIL/uL (ref 3.87–5.11)
RDW: 15.6 % — ABNORMAL HIGH (ref 11.5–15.5)
WBC: 9.4 10*3/uL (ref 4.0–10.5)
nRBC: 0 % (ref 0.0–0.2)

## 2021-03-05 LAB — COMPREHENSIVE METABOLIC PANEL
ALT: 10 U/L (ref 0–44)
AST: 16 U/L (ref 15–41)
Albumin: 3.2 g/dL — ABNORMAL LOW (ref 3.5–5.0)
Alkaline Phosphatase: 71 U/L (ref 38–126)
Anion gap: 10 (ref 5–15)
BUN: 15 mg/dL (ref 8–23)
CO2: 29 mmol/L (ref 22–32)
Calcium: 9.5 mg/dL (ref 8.9–10.3)
Chloride: 100 mmol/L (ref 98–111)
Creatinine, Ser: 1.11 mg/dL — ABNORMAL HIGH (ref 0.44–1.00)
GFR, Estimated: 54 mL/min — ABNORMAL LOW (ref >60)
Glucose, Bld: 97 mg/dL (ref 70–99)
Potassium: 3.9 mmol/L (ref 3.5–5.1)
Sodium: 139 mmol/L (ref 135–145)
Total Bilirubin: 0.6 mg/dL (ref 0.3–1.2)
Total Protein: 7.1 g/dL (ref 6.5–8.1)

## 2021-03-05 LAB — TROPONIN I (HIGH SENSITIVITY)
Troponin I (High Sensitivity): 8 ng/L (ref <18)
Troponin I (High Sensitivity): 8 ng/L (ref <18)

## 2021-03-05 LAB — BRAIN NATRIURETIC PEPTIDE: B Natriuretic Peptide: 23.9 pg/mL (ref 0.0–100.0)

## 2021-03-05 MED ORDER — ALBUTEROL SULFATE HFA 108 (90 BASE) MCG/ACT IN AERS
2.0000 | INHALATION_SPRAY | RESPIRATORY_TRACT | Status: DC | PRN
  Administered 2021-03-09: 2 via RESPIRATORY_TRACT
  Filled 2021-03-05: qty 6.7

## 2021-03-05 NOTE — ED Provider Notes (Signed)
Emergency Medicine Provider Triage Evaluation Note  Samantha Mason , a 70 y.o. female with a history of COPD and asthma presents to the emergency department complaining of worsening shortness of breath.  Patient is on supplemental oxygen at night and during the day at 2 L and has had no increased oxygen demands.  She denies chest pain or chest tightness.  No rhinorrhea, nasal congestion or nonproductive cough.  Denies recent weight gain.  Denies a history of CHF.  States that she does not feel increasingly anxious.  No pain in the upper back.  She has been afebrile at home.  Review of Systems  Positive: Patient has shortness of breath.  Negative: No chest pain, chest tightness   Physical Exam  BP (!) 163/71 (BP Location: Left Arm)   Pulse 86   Temp 98.3 F (36.8 C) (Oral)   Resp (!) 22   Ht 5\' 4"  (1.626 m)   Wt (!) 149.2 kg   SpO2 97%   BMI 56.47 kg/m  Gen:   Awake, no distress  Resp:  Patient seems breathless and tachypneic.  No wheezing or other adventitious lung sounds. MSK:   Moves extremities without difficulty  Other:    Medical Decision Making  Medically screening exam initiated at 3:32 PM.  Appropriate orders placed.  MANSI TOKAR was informed that the remainder of the evaluation will be completed by another provider, this initial triage assessment does not replace that evaluation, and the importance of remaining in the ED until their evaluation is complete.     Vallarie Mare [place], PA-C 03/05/21 1537    Lavonia Drafts, MD 03/05/21 [phone]

## 2021-03-05 NOTE — Assessment & Plan Note (Addendum)
-   Acute exacerbation. Patient is actively struggling to breath today in office. Her breathing has steadily worsening over the last few months. VSS, afebrile. Difficulty to assess lung sounds on exam d/t anxiety. She did not tolerate trial of Breztri. Currenly on Symbicort 165mcg two puffs twice daily, needs Spiriva 1.7mcg daily but can not afford.  Needs ED evaluation and treatment.

## 2021-03-05 NOTE — Assessment & Plan Note (Signed)
-   Not currently using CPAP. Pressure setting may need to be adjusted, likely would benefit from in-lab CPAP titration study on follow-up

## 2021-03-05 NOTE — Assessment & Plan Note (Addendum)
-   Symptoms are somewhat out of proportion with her PFTs results. CTA in September 2022 was negative fo PE or consolidative pneumonia. May need right heart cath,  HRCT and possibly may need neurology referral to rule out neuromuscular disease.

## 2021-03-05 NOTE — ED Triage Notes (Signed)
Pt states that her sob has cont to get worse, pt states this weekend is has gotten worse, pt was seen at her appointment at [facility] and brought the pt to the ER

## 2021-03-05 NOTE — Patient Instructions (Addendum)
Advised patient to present to ED d/t acute dyspnea symptoms

## 2021-03-05 NOTE — ED Provider Notes (Signed)
[facility] Emergency Department Provider Note   ____________________________________________   Event Date/Time   First MD Initiated Contact with Patient 03/05/21 2354     (approximate)  I have reviewed the triage vital signs and the nursing notes.   HISTORY  Chief Complaint Shortness of Breath    HPI Samantha Mason is a 70 y.o. female referred to the ED by her pulmonologist for admission for COPD exacerbation.  Patient with a history of COPD, OSA, hypertension, CVA who does not regularly use oxygen which she has available at home.  However, for the past 3 days she has used it continuously as she has noted room air saturations 88% while at rest.  She decided to wait until her previously scheduled pulmonology appointment today to be evaluated for acute breathing difficulty.  Her pulmonologist directed her to the ED for treatment and admission to further investigate underlying causes such as neuromuscular disease, cardiac etiology, etc.  Patient reports she recently had negative cardiac stress test and CTA chest evaluating PE.  Endorses chest tightness, wheezing, shortness of breath, dyspnea on exertion, dizziness.  Denies fever, cough, abdominal pain, nausea or vomiting.     Past Medical History:  Diagnosis Date   Acute bronchitis    Anemia    Anxiety and depression    Arthritis    Asthma    B12 deficiency 03/14/2017   Chest pain 07/02/2018   COPD (chronic obstructive pulmonary disease) ([place]) 07/24/2018   Family history of blood clots 07/16/2017   Irritable bowel syndrome    Malignant hypertension    Obesity    OSA (obstructive sleep apnea)    uses cpap   Pneumonia 2017   Restless leg syndrome    Stroke [facility]) 1995   mild    Patient Active Problem List   Diagnosis Date Noted   COPD with acute exacerbation ([place]) 03/06/2021   Chronic diastolic CHF (congestive heart failure) ([place]) 03/06/2021   History of stroke 11/17/2020   Ovarian failure  02/21/2020   Primary osteoarthritis of right knee 02/13/2019   S/P total knee arthroplasty, left 02/13/2019   Aortic atherosclerosis ([place]) 08/01/2018   B12 deficiency anemia 07/07/2018   Benign neoplasm of ascending colon    Severe persistent asthma 02/13/2018   Vitamin D deficiency 03/17/2017   Prediabetes 03/14/2017   Microcytic anemia 03/14/2017   IBS (irritable bowel syndrome) 03/12/2017   Allergic rhinitis 03/12/2017   Restless leg syndrome 03/12/2017   GERD (gastroesophageal reflux disease) 10/16/2016   Essential hypertension 10/16/2016   CHF (congestive heart failure) ([place]) 12/08/2014   Morbid obesity with BMI of 50.0-59.9, adult ([place]) 12/08/2014   OSA on CPAP 12/08/2014   Shortness of breath 04/07/2014    Past Surgical History:  Procedure Laterality Date   CHOLECYSTECTOMY     COLONOSCOPY WITH PROPOFOL N/A 03/31/2018   Procedure: COLONOSCOPY WITH PROPOFOL;  Surgeon: Lucilla Lame, MD;  Location: [facility] ENDOSCOPY;  Service: Endoscopy;  Laterality: N/A;   KNEE ARTHROPLASTY Right 04/30/2019   Procedure: COMPUTER ASSISTED TOTAL KNEE ARTHROPLASTY;  Surgeon: Dereck Leep, MD;  Location: [facility] ORS;  Service: Orthopedics;  Laterality: Right;   saliva gland removal  1998   TOTAL KNEE ARTHROPLASTY Left 2014   TUBAL LIGATION Bilateral     Prior to Admission medications   Medication Sig Start Date End Date Taking? Authorizing Provider  acetaminophen (TYLENOL) 500 MG tablet Take 1,000 mg by mouth every 6 (six) hours as needed.   Yes [provider]  azithromycin (  ZITHROMAX) 250 MG tablet TAKE 2 TABLETS BY MOUTH TODAY, THEN TAKE 1 TABLET DAILY FOR 4 DAYS 02/28/21  Yes [provider]  benzonatate (TESSALON) 100 MG capsule Take 100 mg by mouth 3 (three) times daily as needed for cough.   Yes [provider]  cetirizine (ZYRTEC) 10 MG tablet Take 1 tablet (10 mg total) by mouth daily. 12/07/14  Yes Ashok Norris, MD  cholestyramine (QUESTRAN) 4 g packet Take  1 packet (4 g total) and drink by mouth at bedtime. 07/12/20  Yes Glean Hess, MD  furosemide (LASIX) 20 MG tablet Take 1 tablet (20 mg total) by mouth daily. 01/09/21 04/09/21 Yes Furth, Cadence H, PA-C  ipratropium-albuterol (DUONEB) 0.5-2.5 (3) MG/3ML SOLN Take 3 mLs by nebulization every 4 (four) hours as needed. 02/15/21 03/17/21 Yes Kasa, Maretta Bees, MD  lisinopril (ZESTRIL) 10 MG tablet TAKE 1 TABLET BY MOUTH DAILY. 07/12/20 07/12/21 Yes Glean Hess, MD  multivitamin-iron-minerals-folic acid (CENTRUM) chewable tablet Chew 1 tablet by mouth daily.   Yes [provider]  pantoprazole (PROTONIX) 40 MG tablet TAKE 1 TABLET BY MOUTH DAILY 09/26/20 09/26/21 Yes Glean Hess, MD  promethazine (PHENERGAN) 25 MG tablet TAKE 1 TABLET BY MOUTH EVERY 6 HOURS AS NEEDED FOR NAUSEA OR VOMITING. 02/27/21 02/27/22 Yes Glean Hess, MD  rOPINIRole (REQUIP) 1 MG tablet Take 1 tablet (1 mg total) by mouth 3 (three) times daily as needed. 02/20/21  Yes Glean Hess, MD  albuterol (ACCUNEB) 1.25 MG/3ML nebulizer solution Take 3 mLs (1.25 mg total) by nebulization every 6 (six) hours as needed for wheezing. Patient not taking: Reported on 03/06/2021 09/22/20   Rai, Vernelle Emerald, MD  Budeson-Glycopyrrol-Formoterol (BREZTRI AEROSPHERE) 160-9-4.8 MCG/ACT AERO Inhale 2 puffs into the lungs in the morning and at bedtime. Patient not taking: Reported on 03/06/2021 02/07/21   Tyler Pita, MD  calcium-vitamin D (OSCAL 500/200 D-3) 500-200 MG-UNIT tablet Take 1 tablet by mouth daily with breakfast. Patient not taking: Reported on 03/06/2021    [provider]    Allergies Nuvigil [armodafinil], Penicillins, Gabapentin, and Provigil [modafinil]  Family History  Problem Relation Age of Onset   Dementia Mother    COPD Father        95   Diabetes Father    Heart failure Brother    Coronary artery disease Brother 28       CABG   Heart failure Maternal Grandmother    Stroke  Maternal Grandmother    Breast cancer Neg Hx     Social History Social History   Tobacco Use   Smoking status: Never   Smokeless tobacco: Never  Vaping Use   Vaping Use: Never used  Substance Use Topics   Alcohol use: No    Alcohol/week: 0.0 standard drinks   Drug use: No    Review of Systems  Constitutional: No fever/chills Eyes: No visual changes. ENT: No sore throat. Cardiovascular: Positive for chest pain. Respiratory: Positive for wheezing and shortness of breath. Gastrointestinal: No abdominal pain.  No nausea, no vomiting.  No diarrhea.  No constipation. Genitourinary: Negative for dysuria. Musculoskeletal: Negative for back pain. Skin: Negative for rash. Neurological: Positive for dizziness.  Negative for headaches, focal weakness or numbness.   ____________________________________________   PHYSICAL EXAM:  VITAL SIGNS: ED Triage Vitals [03/05/21 1518]  Enc Vitals Group     BP (!) 163/71     Pulse Rate 86     Resp (!) 22     Temp 98.3  F (36.8 C)     Temp Source Oral     SpO2 97 %     Weight (!) 329 lb (149.2 kg)     Height 5\' 4"  (1.626 m)     Head Circumference      Peak Flow      Pain Score 0     Pain Loc      Pain Edu?      Excl. in [place]?     Constitutional: Alert and oriented.  Elderly appearing and in mild to moderate acute distress. Eyes: Conjunctivae are normal. PERRL. EOMI. Head: Atraumatic. Nose: No congestion/rhinnorhea. Mouth/Throat: Mucous membranes are mildly dry. Neck: No stridor.   Cardiovascular: Normal rate, regular rhythm. Grossly normal heart sounds.  Good peripheral circulation. Respiratory: Increased respiratory effort.  Mild retractions. Lungs with audible wheezing. Gastrointestinal: Soft and nontender to light or deep palpation. No distention. No abdominal bruits. No CVA tenderness. Musculoskeletal: No lower extremity tenderness nor edema.  No joint effusions. Neurologic:  Normal speech and language. No gross focal  neurologic deficits are appreciated.  Skin:  Skin is warm, dry and intact. No rash noted. Psychiatric: Mood and affect are normal. Speech and behavior are normal.  ____________________________________________   LABS (all labs ordered are listed, but only abnormal results are displayed)  Labs Reviewed  CBC - Abnormal; Notable for the following components:      Result Value   Hemoglobin 10.6 (*)    HCT 34.2 (*)    MCV 79.9 (*)    MCH 24.8 (*)    RDW 15.6 (*)    All other components within normal limits  COMPREHENSIVE METABOLIC PANEL - Abnormal; Notable for the following components:   Creatinine, Ser 1.11 (*)    Albumin 3.2 (*)    GFR, Estimated 54 (*)    All other components within normal limits  RESP PANEL BY RT-PCR (FLU A&B, COVID) ARPGX2  BRAIN NATRIURETIC PEPTIDE  MAGNESIUM  TROPONIN I (HIGH SENSITIVITY)  TROPONIN I (HIGH SENSITIVITY)   ____________________________________________  EKG  ED ECG REPORT I, Jarid Sasso J, the attending physician, personally viewed and interpreted this ECG.   Date: 03/06/2021  EKG Time: 1542  Rate: 82  Rhythm: normal sinus rhythm  Axis: Normal  Intervals:none  ST&T Change: Nonspecific  ____________________________________________  RADIOLOGY I, Birdella Sippel J, personally viewed and evaluated these images (plain radiographs) as part of my medical decision making, as well as reviewing the written report by the radiologist.  ED MD interpretation: Chronic bronchitic lung changes, no acute abnormality  Official radiology report(s): DG Chest 2 View  Result Date: 03/05/2021 CLINICAL DATA:  Worsening shortness of breath. EXAM: CHEST - 2 VIEW COMPARISON:  CTA chest February 13, 2021 FINDINGS: The heart size and mediastinal contours are within normal limits. Chronic bronchitic lung changes. No focal airspace consolidation. Unchanged elevation/eventration of the right hemidiaphragm. No pleural effusion. No pneumothorax. The visualized skeletal  structures are unremarkable. IMPRESSION: Chronic bronchitic lung changes.  No focal consolidation. Electronically Signed   By: Dahlia Bailiff M.D.   On: 03/05/2021 16:28    ____________________________________________   PROCEDURES  Procedure(s) performed (including Critical Care):  .1-3 Lead EKG Interpretation Performed by: Paulette Blanch, MD Authorized by: Paulette Blanch, MD     Interpretation: normal     ECG rate:  85   ECG rate assessment: normal     Rhythm: sinus rhythm     Ectopy: none     Conduction: normal   Comments:  Patient placed on cardiac monitor to evaluate for arrhythmias   ____________________________________________   INITIAL IMPRESSION / ASSESSMENT AND PLAN / ED COURSE  As part of my medical decision making, I reviewed the following data within the [place] History obtained from family, Nursing notes reviewed and incorporated, Labs reviewed, EKG interpreted, Old chart reviewed, Radiograph reviewed, Discussed with admitting physician, and Notes from prior ED visits     70 year old female with COPD referred by her pulmonologist in mild respiratory distress. Differential includes, but is not limited to, viral syndrome, bronchitis including COPD exacerbation, pneumonia, reactive airway disease including asthma, CHF including exacerbation with or without pulmonary/interstitial edema, pneumothorax, ACS, thoracic trauma, and pulmonary embolism.   Laboratory results unremarkable; stable anemia.  Will start IV Solu-Medrol, DuoNeb, Magnesium.  Patient is currently on 2 L nasal cannula oxygen with saturations 97%.  Clinical Course as of 03/06/21 0526  Tue Mar 06, 2021  0127 Wheezing improved; diminished aeration remains.  Will administer second DuoNeb.  Respiratory panel is negative.  Will discuss with hospitalist services for admission. [JS]    Clinical Course User Index [JS] Paulette Blanch, MD      ____________________________________________   FINAL CLINICAL IMPRESSION(S) / ED DIAGNOSES  Final diagnoses:  COPD exacerbation ([place])  Dyspnea, unspecified type  Hypoxia     ED Discharge Orders     None        Note:  This document was prepared using Dragon voice recognition software and may include unintentional dictation errors.    Paulette Blanch, MD 03/06/21 [phone]

## 2021-03-05 NOTE — Progress Notes (Addendum)
@Patient  ID: Samantha Mason, female    DOB: 01-14-1951, 70 y.o.   MRN: [id number]  No chief complaint on file.   Referring provider: Glean Hess, MD   SYNOPSIS The patient is a 70 year old female she has a history of asthma admitted to the hospital from 3/6 to 3/8 for COPD exacerbation, treated with IV steroids and duo nebs, she was initially placed on BiPAP. She had previously seen Dr. Stevenson Clinch for asthma in 2016, noted to have triggers of pollen.  HPI: 70 year old female, never smoked.  Past medical history significant for moderate persistent asthma, OSA, RLS. Patient of Dr. Mortimer Fries, last seen in office on 03/24/2020. She is on Symbicort, Requip and Nuvigil prn. She has 2 dogs at home, 1 sleeps in bed with her   Previous [facility] pulmonary encounter: 03/24/21- Dr. Mortimer Fries Follow-up asthma Asthma seems to be well-controlled at this time No signs of exacerbation No indication for steroids at this time  No indication for antibiotics No evidence of heart failure at this time No evidence or signs of infection at this time  Follow-up OSA Patient uses CPAP at night however she needs to get new machine Compliance report not available She takes Requip for restless leg syndrome   Progressive shortness of breath and increased work of breathing over the last several months Patient complains of orthopnea Intermittent leg swelling Recommend cardiology referral for evaluation for cardiac dysfunction    10/18/2020 Patient presents today for 56-month follow-up. During last visit she reported increased dyspnea symptoms with associated orthopnea and leg swelling. She was referred to cardiology in November 2022 for evaluation for cardiac dysfunction. Since last visit she was in the hospital in April and May for acute respiratory failure secondary to asthma exacerbation and congestive heart failure. She gets out of breath just sitting and/or with minimal exertion. She has some associated chest tightness  and occasional cough. Cough is dry, non-productive. She is compliant with Symbicort 160 and Spiriva 1.58mcg with little improvement. She tells me that she has been started on HCTZ but this is not on medication list or discharge summary. She has not yet seen cardiology in clinic yet, she has an apt in June with them. She has been 100% compliant with use since receiving new CPAP machine on May 19th.   Airview download 10/05/20-10/16/20 12/12 days (100%); 10 days > 4 hours Average usage days used  Pressure 5-15cm h20 (11.6cm h20- 95%) Airleaks 39.8L/min AHI 6.4   11/28/20- Dr. Mortimer Fries Follow-up asthma Well-controlled at this time No exacerbation at this time  no indication for steroids or antibiotics    No exacerbation at this time No evidence of heart failure at this time No evidence or signs of infection at this time No respiratory distress No fevers, chills, nausea, vomiting, diarrhea No evidence of lower extremity edema No evidence hemoptysis  Follow-up OSA Uses CPAP at night Compliance report reviewed with patient Takes Requip for restless leg syndrome 67 compliant for days 53% compliant for greater than 4 hours AHI reduced to 5.5  02/07/21- Acute visit, Dr. Patsey Berthold  Patient is a 69 year old lifelong never smoker, with morbid obesity and obstructive sleep apnea on CPAP, patient of Dr. Patricia Pesa who presents today for an acute visit.  The patient states that she has had issues with increasing shortness of breath for over a month now.  Reviewing in her chart she has had 2 admissions to the hospital for "asthma exacerbations since April.  She also has had multiple visits with  pulmonary and cardiac subspecialists.  He had a prednisone taper and azithromycin Z-Pak with no response in symptoms.  Albuterol no longer helping.  She uses Symbicort twice a day.  Was previously on Spiriva but not covered by her insurance.  Uses Combivent with some mixed results.  She notes that she gets shortness of  breath with minimal exertion like walking from her bedroom to the bathroom.  She has issues with diastolic dysfunction and congestive heart failure with preserved left ventricular ejection fraction.  In August she had Lasix decreased which has coincided with increasing shortness of breath.  She noted yesterday that she took a full dose of 40 mg Lasix and felt somewhat better.  Cardiology has ordered an ischemia work-up imaging at [facility].  The patient has not had this done yet she notes lower extremity edema every day.  She also notes some lower extremity pain and tenderness but cannot specify where.  This feels to be generalized.  She notes that when she walks her saturations are remaining between 92 to 93% as she does have an oximeter at home.  No orthopnea or paroxysmal nocturnal dyspnea.  She has had a dry cough has been nonproductive.  Of note she is on an ACE inhibitor.  She has been compliant with CPAP.  He does not endorse any fevers, chills or sweats.  No sputum production or hemoptysis.  She does not voice any other complaint today.  Family history reviewed and is notable for "blood clots" in various members of the family.   03/05/2021- interim hx  Patient presents today for 2-3 week follow-up. She saw Dr. Patsey Berthold on 02/07/21 for acute visit for SOB. She was given trial Breztri Aerosphere 2 puffs twice daily. Ordered for CXR, labs and VQ scan. VQ scan was performed on 02/12/21 but could not exclude PE so she was advised to presents to ED. She had CTA in ED on 02/13/21 that was negative for PE or consolidative pneumonia.   She is not doing well today. Reports increased shortness of breath/work load to breath for the last 2-3 days. She has been having to use her oxygen at home, reports noticing O2 dropping to 88% RA with exertion over the weekend. She stopped using Breztri d/t sore throat. This has calmed down since she resume Symbicort. She is not currently taking Spiriva as it is not covered by her  insurance. She jsut recently needed to use her nebulizer yesterday fort the first time. Patient's old CPAP machine is not currently working and she feels new machine is suffocating her.    Imaging:  **CT chest 07/02/2018>> images personally reviewed, lung volumes are reduced consistent with restrictive lung disease from body habitus **Chest x-ray 07/24/2018>>  increased lung markings suggestive of chronic bronchitis, hyperinflation suggestive of emphysema. ** CTA in ED on 02/13/21 that was negative for PE or consolidative pneumonia   Allergies  Allergen Reactions   Nuvigil [Armodafinil] Hives   Penicillins Diarrhea and Nausea And Vomiting    Did it involve swelling of the face/tongue/throat, SOB, or low BP? no Did it involve sudden or severe rash/hives, skin peeling, or any reaction on the inside of your mouth or nose? No Did you need to seek medical attention at a hospital or doctor's office? No When did it last happen?  in her 78s    If all above answers are "NO", may proceed with cephalosporin use.    Gabapentin Other (See Comments)    "wiped her out, couldn't stay awake"  Provigil [Modafinil] Hives    Immunization History  Administered Date(s) Administered   Fluad Quad(high Dose 65+) 02/15/2019, 02/16/2020, 02/19/2021   Influenza Split 02/23/2018   Influenza-Unspecified 02/16/2014, 01/18/2017   Moderna Sars-Covid-2 Vaccination 01/05/2020, 02/01/2020   Pneumococcal Conjugate-13 05/16/2014   Pneumococcal Polysaccharide-23 03/12/2017, 07/16/2017    Past Medical History:  Diagnosis Date   Acute bronchitis    Anemia    Anxiety and depression    Arthritis    Asthma    B12 deficiency 03/14/2017   Chest pain 07/02/2018   COPD (chronic obstructive pulmonary disease) ([place]) 07/24/2018   Family history of blood clots 07/16/2017   Irritable bowel syndrome    Malignant hypertension    Obesity    OSA (obstructive sleep apnea)    uses cpap   Pneumonia 2017   Restless leg syndrome     Stroke ([place]) 1995   mild    Tobacco History: Social History   Tobacco Use  Smoking Status Never  Smokeless Tobacco Never   Counseling given: Not Answered   Outpatient Medications Prior to Visit  Medication Sig Dispense Refill   albuterol (ACCUNEB) 1.25 MG/3ML nebulizer solution Take 3 mLs (1.25 mg total) by nebulization every 6 (six) hours as needed for wheezing. 75 mL 12   azithromycin (ZITHROMAX) 250 MG tablet TAKE 2 TABLETS BY MOUTH TODAY, THEN TAKE 1 TABLET DAILY FOR 4 DAYS     Budeson-Glycopyrrol-Formoterol (BREZTRI AEROSPHERE) 160-9-4.8 MCG/ACT AERO Inhale 2 puffs into the lungs in the morning and at bedtime. 10.7 g 3   calcium-vitamin D (OSCAL 500/200 D-3) 500-200 MG-UNIT tablet Take 1 tablet by mouth daily with breakfast.     cetirizine (ZYRTEC) 10 MG tablet Take 1 tablet (10 mg total) by mouth daily. 30 tablet 2   cholestyramine (QUESTRAN) 4 g packet Take 1 packet (4 g total) and drink by mouth at bedtime. 60 each 2   furosemide (LASIX) 20 MG tablet Take 1 tablet (20 mg total) by mouth daily. 90 tablet 3   ipratropium-albuterol (DUONEB) 0.5-2.5 (3) MG/3ML SOLN Take 3 mLs by nebulization every 4 (four) hours as needed. 120 mL 1   lisinopril (ZESTRIL) 10 MG tablet TAKE 1 TABLET BY MOUTH DAILY. 90 tablet 3   multivitamin-iron-minerals-folic acid (CENTRUM) chewable tablet Chew 1 tablet by mouth daily.     pantoprazole (PROTONIX) 40 MG tablet TAKE 1 TABLET BY MOUTH DAILY 90 tablet 1   promethazine (PHENERGAN) 25 MG tablet TAKE 1 TABLET BY MOUTH EVERY 6 HOURS AS NEEDED FOR NAUSEA OR VOMITING. 30 tablet 0   rOPINIRole (REQUIP) 1 MG tablet Take 1 tablet (1 mg total) by mouth 3 (three) times daily as needed. 90 tablet 0   diltiazem (CARDIZEM CD) 180 MG 24 hr capsule Take 1 capsule (180 mg total) by mouth daily. 30 capsule 1   potassium chloride (KLOR-CON) 10 MEQ tablet Take 1 tablet (10 mEq total) by mouth daily. 30 tablet 0   No facility-administered medications prior to visit.     Review of Systems  Review of Systems  Constitutional: Negative.   HENT: Negative.    Respiratory:  Positive for cough and shortness of breath.   Cardiovascular:  Positive for leg swelling.    Physical Exam  BP 140/70 (BP Location: Left Arm, Patient Position: Sitting, Cuff Size: Normal)   Pulse 100   Temp (!) 97.5 F (36.4 C) (Oral)   Ht 5\' 4"  (1.626 m)   Wt (!) 329 lb (149.2 kg)   SpO2 96%  BMI 56.47 kg/m  Physical Exam Constitutional:      General: She is in acute distress.     Appearance: Normal appearance. She is obese.  HENT:     Head: Normocephalic and atraumatic.     Mouth/Throat:     Comments: Deferred d/t masking Cardiovascular:     Rate and Rhythm: Normal rate.  Pulmonary:     Comments: Difficult to assess lung sounds d/t anxiety/upper airway pseudo wheeze Musculoskeletal:     Comments: In WC  Skin:    General: Skin is warm and dry.  Neurological:     General: No focal deficit present.     Mental Status: She is alert and oriented to person, place, and time. Mental status is at baseline.  Psychiatric:        Mood and Affect: Mood normal.        Behavior: Behavior normal.        Thought Content: Thought content normal.        Judgment: Judgment normal.     Lab Results:  CBC    Component Value Date/Time   WBC 9.4 03/05/2021 1525   RBC 4.28 03/05/2021 1525   HGB 10.6 (L) 03/05/2021 1525   HGB 10.2 (L) 04/20/2014 1610   HCT 34.2 (L) 03/05/2021 1525   HCT 33.3 (L) 04/20/2014 1610   PLT 297 03/05/2021 1525   PLT 267 04/20/2014 1610   MCV 79.9 (L) 03/05/2021 1525   MCV 76 (L) 04/20/2014 1610   MCH 24.8 (L) 03/05/2021 1525   MCHC 31.0 03/05/2021 1525   RDW 15.6 (H) 03/05/2021 1525   RDW 17.4 (H) 04/20/2014 1610   LYMPHSABS 1.7 02/13/2021 1405   LYMPHSABS 1.6 04/20/2014 1610   MONOABS 0.5 02/13/2021 1405   MONOABS 0.4 04/20/2014 1610   EOSABS 0.2 02/13/2021 1405   EOSABS 0.2 04/20/2014 1610   BASOSABS 0.1 02/13/2021 1405   BASOSABS 0.0  04/20/2014 1610    BMET    Component Value Date/Time   NA 139 03/05/2021 1525   NA 142 11/16/2020 1451   NA 141 05/25/2014 1407   K 3.9 03/05/2021 1525   K 3.6 05/25/2014 1407   CL 100 03/05/2021 1525   CL 106 05/25/2014 1407   CO2 29 03/05/2021 1525   CO2 28 05/25/2014 1407   GLUCOSE 97 03/05/2021 1525   GLUCOSE 86 05/25/2014 1407   BUN 15 03/05/2021 1525   BUN 15 11/16/2020 1451   BUN 15 05/25/2014 1407   CREATININE 1.11 (H) 03/05/2021 1525   CREATININE 1.24 05/25/2014 1407   CALCIUM 9.5 03/05/2021 1525   CALCIUM 8.6 05/25/2014 1407   GFRNONAA 54 (L) 03/05/2021 1525   GFRNONAA 47 (L) 05/25/2014 1407   GFRNONAA 47 (L) 09/26/2013 0325   GFRAA 52 (L) 02/16/2020 1154   GFRAA 56 (L) 05/25/2014 1407   GFRAA 54 (L) 09/26/2013 0325    BNP    Component Value Date/Time   BNP 15.5 02/07/2021 1720    ProBNP No results found for: PROBNP  Imaging: DG Chest 2 View  Result Date: 02/13/2021 CLINICAL DATA:  Shortness of breath EXAM: CHEST - 2 VIEW COMPARISON:  09/19/2020 FINDINGS: Mild peribronchial thickening. Heart and mediastinal contours are within normal limits. No focal opacities or effusions. No acute bony abnormality. IMPRESSION: Mild bronchitic changes. Electronically Signed   By: Rolm Baptise M.D.   On: 02/13/2021 08:32   CT Angio Chest PE W and/or Wo Contrast  Result Date: 02/13/2021 CLINICAL DATA:  Possible abnormal pulmonary  perfusion test cough short of breath EXAM: CT ANGIOGRAPHY CHEST WITH CONTRAST TECHNIQUE: Multidetector CT imaging of the chest was performed using the standard protocol during bolus administration of intravenous contrast. Multiplanar CT image reconstructions and MIPs were obtained to evaluate the vascular anatomy. CONTRAST:  65mL OMNIPAQUE IOHEXOL 350 MG/ML SOLN COMPARISON:  Perfusion study 02/12/2021, chest x-ray 02/12/2021 CT chest 08/29/2020 FINDINGS: Cardiovascular: Satisfactory opacification of the pulmonary arteries to the segmental level. No  evidence of pulmonary embolism. Nonaneurysmal aorta. No dissection seen. Borderline cardiomegaly. No pericardial effusion Mediastinum/Nodes: No enlarged mediastinal, hilar, or axillary lymph nodes. Thyroid gland, trachea, and esophagus demonstrate no significant findings. Lungs/Pleura: No consolidation, pleural effusion or pneumothorax. Mild mosaic attenuation likely due to small airways disease. Upper Abdomen: No acute abnormality. Musculoskeletal: Degenerative changes. No acute osseous abnormality. Review of the MIP images confirms the above findings. IMPRESSION: 1. Negative for acute pulmonary embolus. 2. Negative for consolidative pneumonia. Mild mosaicism, suspect that this is secondary to small airways disease Electronically Signed   By: Donavan Foil M.D.   On: 02/13/2021 15:40   NM Pulmonary Perfusion  Result Date: 02/13/2021 CLINICAL DATA:  Shortness of breath. EXAM: NUCLEAR MEDICINE PERFUSION LUNG SCAN TECHNIQUE: Perfusion images were obtained in multiple projections after intravenous injection of radiopharmaceutical. Ventilation scans intentionally deferred if perfusion scan and chest x-ray adequate for interpretation during COVID 19 epidemic. RADIOPHARMACEUTICALS:  3.79 mCi Tc-22m MAA IV COMPARISON:  Chest x-ray 02/12/2021. CT chest 05/07/2019. Prior nuclear medicine lung scan report 08/08/1997. FINDINGS: Perfusion defects in the posterior left mid lung can not be excluded. Pulmonary embolus can not be excluded. Elevation right hemidiaphragm again noted. IMPRESSION: Perfusion defects in the posterior left mid lung can not be excluded. Pulmonary embolus can not be excluded. These results will be called to the ordering clinician or representative by the Radiologist Assistant, and communication documented in the PACS or [employer]. Electronically Signed   By: Marcello Moores  Register M.D.   On: 02/13/2021 11:15     Assessment & Plan:   Severe persistent asthma - Acute exacerbation. Patient is  actively struggling to breath today in office. Her breathing has steadily worsening over the last few months. VSS, afebrile. Difficulty to assess lung sounds on exam d/t anxiety. She did not tolerate trial of Breztri. Currenly on Symbicort 147mcg two puffs twice daily, needs Spiriva 1.69mcg daily but can not afford.  Needs ED evaluation and treatment.   OSA on CPAP - Not currently using CPAP. Pressure setting may need to be adjusted, likely would benefit from in-lab CPAP titration study on follow-up  Shortness of breath - Symptoms are somewhat out of proportion with her PFTs results. CTA in September 2022 was negative fo PE or consolidative pneumonia. May need right heart cath,  HRCT and possibly may need neurology referral to rule out neuromuscular disease.    Martyn Ehrich, NP 03/05/2021

## 2021-03-06 ENCOUNTER — Other Ambulatory Visit: Payer: Self-pay

## 2021-03-06 ENCOUNTER — Encounter: Payer: Self-pay | Admitting: Internal Medicine

## 2021-03-06 DIAGNOSIS — R0902 Hypoxemia: Secondary | ICD-10-CM | POA: Diagnosis present

## 2021-03-06 DIAGNOSIS — I5032 Chronic diastolic (congestive) heart failure: Secondary | ICD-10-CM | POA: Diagnosis present

## 2021-03-06 DIAGNOSIS — I10 Essential (primary) hypertension: Secondary | ICD-10-CM | POA: Diagnosis not present

## 2021-03-06 DIAGNOSIS — Z20822 Contact with and (suspected) exposure to covid-19: Secondary | ICD-10-CM | POA: Diagnosis present

## 2021-03-06 DIAGNOSIS — E662 Morbid (severe) obesity with alveolar hypoventilation: Secondary | ICD-10-CM | POA: Diagnosis present

## 2021-03-06 DIAGNOSIS — I110 Hypertensive heart disease with heart failure: Secondary | ICD-10-CM | POA: Diagnosis present

## 2021-03-06 DIAGNOSIS — J441 Chronic obstructive pulmonary disease with (acute) exacerbation: Secondary | ICD-10-CM | POA: Diagnosis present

## 2021-03-06 DIAGNOSIS — I700 Atherosclerosis of aorta: Secondary | ICD-10-CM | POA: Diagnosis present

## 2021-03-06 DIAGNOSIS — D649 Anemia, unspecified: Secondary | ICD-10-CM | POA: Diagnosis present

## 2021-03-06 DIAGNOSIS — K219 Gastro-esophageal reflux disease without esophagitis: Secondary | ICD-10-CM | POA: Diagnosis present

## 2021-03-06 DIAGNOSIS — J4551 Severe persistent asthma with (acute) exacerbation: Secondary | ICD-10-CM | POA: Diagnosis present

## 2021-03-06 DIAGNOSIS — Z880 Allergy status to penicillin: Secondary | ICD-10-CM | POA: Diagnosis not present

## 2021-03-06 DIAGNOSIS — I5033 Acute on chronic diastolic (congestive) heart failure: Secondary | ICD-10-CM | POA: Diagnosis present

## 2021-03-06 DIAGNOSIS — G2581 Restless legs syndrome: Secondary | ICD-10-CM | POA: Diagnosis present

## 2021-03-06 DIAGNOSIS — E861 Hypovolemia: Secondary | ICD-10-CM | POA: Diagnosis not present

## 2021-03-06 DIAGNOSIS — Z8673 Personal history of transient ischemic attack (TIA), and cerebral infarction without residual deficits: Secondary | ICD-10-CM | POA: Diagnosis not present

## 2021-03-06 DIAGNOSIS — Z6841 Body Mass Index (BMI) 40.0 and over, adult: Secondary | ICD-10-CM | POA: Diagnosis not present

## 2021-03-06 DIAGNOSIS — Z9851 Tubal ligation status: Secondary | ICD-10-CM | POA: Diagnosis not present

## 2021-03-06 DIAGNOSIS — Z888 Allergy status to other drugs, medicaments and biological substances status: Secondary | ICD-10-CM | POA: Diagnosis not present

## 2021-03-06 DIAGNOSIS — J9601 Acute respiratory failure with hypoxia: Secondary | ICD-10-CM | POA: Diagnosis present

## 2021-03-06 DIAGNOSIS — M1990 Unspecified osteoarthritis, unspecified site: Secondary | ICD-10-CM | POA: Diagnosis present

## 2021-03-06 DIAGNOSIS — Z8701 Personal history of pneumonia (recurrent): Secondary | ICD-10-CM | POA: Diagnosis not present

## 2021-03-06 DIAGNOSIS — Z9989 Dependence on other enabling machines and devices: Secondary | ICD-10-CM | POA: Diagnosis not present

## 2021-03-06 DIAGNOSIS — G4733 Obstructive sleep apnea (adult) (pediatric): Secondary | ICD-10-CM | POA: Diagnosis present

## 2021-03-06 DIAGNOSIS — Z79899 Other long term (current) drug therapy: Secondary | ICD-10-CM | POA: Diagnosis not present

## 2021-03-06 DIAGNOSIS — K589 Irritable bowel syndrome without diarrhea: Secondary | ICD-10-CM | POA: Diagnosis present

## 2021-03-06 DIAGNOSIS — N179 Acute kidney failure, unspecified: Secondary | ICD-10-CM | POA: Diagnosis not present

## 2021-03-06 DIAGNOSIS — E871 Hypo-osmolality and hyponatremia: Secondary | ICD-10-CM | POA: Diagnosis not present

## 2021-03-06 DIAGNOSIS — E860 Dehydration: Secondary | ICD-10-CM | POA: Diagnosis not present

## 2021-03-06 LAB — RESP PANEL BY RT-PCR (FLU A&B, COVID) ARPGX2
Influenza A by PCR: NEGATIVE
Influenza B by PCR: NEGATIVE
SARS Coronavirus 2 by RT PCR: NEGATIVE

## 2021-03-06 LAB — MAGNESIUM: Magnesium: 1.9 mg/dL (ref 1.7–2.4)

## 2021-03-06 MED ORDER — CHOLESTYRAMINE 4 G PO PACK
4.0000 g | PACK | Freq: Every day | ORAL | Status: DC
  Administered 2021-03-06 – 2021-03-09 (×4): 4 g via ORAL
  Filled 2021-03-06 (×5): qty 1

## 2021-03-06 MED ORDER — LISINOPRIL 10 MG PO TABS
5.0000 mg | ORAL_TABLET | Freq: Every day | ORAL | Status: DC
  Administered 2021-03-06: 5 mg via ORAL
  Filled 2021-03-06: qty 1

## 2021-03-06 MED ORDER — PREDNISONE 20 MG PO TABS
40.0000 mg | ORAL_TABLET | Freq: Every day | ORAL | Status: AC
  Administered 2021-03-07 – 2021-03-10 (×4): 40 mg via ORAL
  Filled 2021-03-06 (×4): qty 2

## 2021-03-06 MED ORDER — ROPINIROLE HCL 1 MG PO TABS
1.0000 mg | ORAL_TABLET | Freq: Every day | ORAL | Status: DC
  Administered 2021-03-06 – 2021-03-07 (×2): 1 mg via ORAL
  Filled 2021-03-06 (×2): qty 1

## 2021-03-06 MED ORDER — METHYLPREDNISOLONE SODIUM SUCC 125 MG IJ SOLR
125.0000 mg | Freq: Once | INTRAMUSCULAR | Status: AC
  Administered 2021-03-06: 125 mg via INTRAVENOUS
  Filled 2021-03-06: qty 2

## 2021-03-06 MED ORDER — GUAIFENESIN-DM 100-10 MG/5ML PO SYRP
10.0000 mL | ORAL_SOLUTION | Freq: Four times a day (QID) | ORAL | Status: DC | PRN
  Administered 2021-03-06 – 2021-03-09 (×4): 10 mL via ORAL
  Filled 2021-03-06 (×4): qty 10

## 2021-03-06 MED ORDER — ONDANSETRON HCL 4 MG PO TABS: 4.0000 mg | ORAL_TABLET | Freq: Four times a day (QID) | ORAL | Status: DC | PRN

## 2021-03-06 MED ORDER — IPRATROPIUM-ALBUTEROL 0.5-2.5 (3) MG/3ML IN SOLN
3.0000 mL | Freq: Once | RESPIRATORY_TRACT | Status: AC
  Administered 2021-03-06: 3 mL via RESPIRATORY_TRACT
  Filled 2021-03-06: qty 3

## 2021-03-06 MED ORDER — ACETAMINOPHEN 325 MG PO TABS: 650.0000 mg | ORAL_TABLET | Freq: Four times a day (QID) | ORAL | Status: DC | PRN

## 2021-03-06 MED ORDER — IPRATROPIUM-ALBUTEROL 0.5-2.5 (3) MG/3ML IN SOLN
3.0000 mL | Freq: Four times a day (QID) | RESPIRATORY_TRACT | Status: DC
  Administered 2021-03-06 – 2021-03-07 (×6): 3 mL via RESPIRATORY_TRACT
  Filled 2021-03-06 (×5): qty 3

## 2021-03-06 MED ORDER — CHLORHEXIDINE GLUCONATE 0.12 % MT SOLN
15.0000 mL | Freq: Two times a day (BID) | OROMUCOSAL | Status: DC
  Administered 2021-03-06 – 2021-03-10 (×8): 15 mL via OROMUCOSAL
  Filled 2021-03-06 (×7): qty 15

## 2021-03-06 MED ORDER — LISINOPRIL 20 MG PO TABS
20.0000 mg | ORAL_TABLET | Freq: Every day | ORAL | Status: DC
  Administered 2021-03-06 – 2021-03-07 (×2): 20 mg via ORAL
  Filled 2021-03-06: qty 2
  Filled 2021-03-06: qty 1

## 2021-03-06 MED ORDER — ORAL CARE MOUTH RINSE
15.0000 mL | Freq: Two times a day (BID) | OROMUCOSAL | Status: DC
  Administered 2021-03-06 – 2021-03-10 (×8): 15 mL via OROMUCOSAL

## 2021-03-06 MED ORDER — ENOXAPARIN SODIUM 80 MG/0.8ML IJ SOSY
0.5000 mg/kg | PREFILLED_SYRINGE | INTRAMUSCULAR | Status: DC
  Administered 2021-03-06 – 2021-03-10 (×5): 75 mg via SUBCUTANEOUS
  Filled 2021-03-06 (×6): qty 0.75

## 2021-03-06 MED ORDER — ONDANSETRON HCL 4 MG/2ML IJ SOLN: 4.0000 mg | Freq: Four times a day (QID) | INTRAMUSCULAR | Status: DC | PRN

## 2021-03-06 MED ORDER — PANTOPRAZOLE SODIUM 40 MG PO TBEC
40.0000 mg | DELAYED_RELEASE_TABLET | Freq: Every day | ORAL | Status: DC
  Administered 2021-03-06 – 2021-03-10 (×5): 40 mg via ORAL
  Filled 2021-03-06 (×4): qty 1

## 2021-03-06 MED ORDER — FUROSEMIDE 20 MG PO TABS
20.0000 mg | ORAL_TABLET | Freq: Every day | ORAL | Status: DC
  Administered 2021-03-06 – 2021-03-07 (×2): 20 mg via ORAL
  Filled 2021-03-06 (×2): qty 1

## 2021-03-06 MED ORDER — BENZONATATE 100 MG PO CAPS
100.0000 mg | ORAL_CAPSULE | Freq: Three times a day (TID) | ORAL | Status: DC | PRN
  Administered 2021-03-08: 13:00:00 100 mg via ORAL
  Filled 2021-03-06: qty 1

## 2021-03-06 MED ORDER — ACETAMINOPHEN 650 MG RE SUPP: 650.0000 mg | Freq: Four times a day (QID) | RECTAL | Status: DC | PRN

## 2021-03-06 MED ORDER — HYDRALAZINE HCL 20 MG/ML IJ SOLN: 10.0000 mg | Freq: Four times a day (QID) | INTRAMUSCULAR | Status: DC | PRN

## 2021-03-06 MED ORDER — METHYLPREDNISOLONE SODIUM SUCC 40 MG IJ SOLR
40.0000 mg | Freq: Two times a day (BID) | INTRAMUSCULAR | Status: AC
  Administered 2021-03-06 (×2): 40 mg via INTRAVENOUS
  Filled 2021-03-06 (×2): qty 1

## 2021-03-06 MED ORDER — MAGNESIUM SULFATE 2 GM/50ML IV SOLN
2.0000 g | Freq: Once | INTRAVENOUS | Status: AC
  Administered 2021-03-06: 2 g via INTRAVENOUS
  Filled 2021-03-06: qty 50

## 2021-03-06 NOTE — Progress Notes (Signed)
PROGRESS NOTE    Samantha Mason  QIW:[id number] DOB: 31-Oct-1950 DOA: 03/05/2021 PCP: Glean Hess, MD  EDOTFA/EDOTF   Assessment & Plan:   Principal Problem:   COPD with acute exacerbation [facility]) Active Problems:   Morbid obesity with BMI of 50.0-59.9, adult (HCC)   OSA on CPAP   Chronic diastolic CHF (congestive heart failure) (HCC)   Samantha Mason is a 70 y.o. female with medical history significant for asthma, OSA on CPAP, HTN, chronic diastolic heart failure, sent in by her pulmonologist because of continued breathing difficulty and O2 sats of 88% while at rest.  Patient was recently evaluated by her cardiologist and had a negative cardiac stress test.  Also states she had a negative PET scan.  States her CPAP machine is not working.  She went to her pulmonologist office today and they sent her in to the ED for further evaluation patient reports chest tightness associated with wheezing dyspnea on exertion and lightheadedness.   Acute hypoxemic respiratory failure 2/2 Presume asthma exacerbation - Was 88% on room air in outpatient setting and placed on 2L and sent to the ED. - Pt admitted to hx of asthma, but denied hx of COPD.  Pt is a never smoker. --No fever, no leukocytosis, CXR no signs of acute infection or pulm edema.  Pt was started on tx for bronchospasm on presentation with IV mag, IV solumedrol and DuoNeb. Plan: --cont IV solumedrol with taper to prednisone --cont DuoNeb q6h  --Continue supplemental O2 to keep sats >=92%, wean as tolerated     Chronic diastolic CHF  -Patient appears euvolemic, and BNP normal at 23 and chest x-ray not consistent with CHF - Last echo April 2022 showed EF 55 to 60%.  Grade 1 diastolic dysfunction --cont home lasix  HTN --BP elevated --cont Lisinopril at increased dose of 20 mg daily --cont home lasix     Morbid obesity with BMI of 50.0-59.9, adult ([place]) - This complicates overall prognosis and care     OSA on CPAP -  Patient reports her machine is broken and she has had trouble getting another  --CPAP nightly   DVT prophylaxis: Lovenox SQ Code Status: Full code  Family Communication: son updated at bedside today Level of care: Med-Surg Dispo:   The patient is from: home Anticipated d/c is to: home Anticipated d/c date is: 1-2 days Patient currently is not medically ready to d/c due to: New O2 requirement, severe dyspnea.   Subjective and Interval History:  Dyspnea improved, however, pt started having coughing spasm.     Objective: Vitals:   03/06/21 1132 03/06/21 1330 03/06/21 1530 03/06/21 1541  BP:  (!) 149/66 (!) 136/56   Pulse: (!) 106 97 (!) 113 (!) 107  Resp: 19 19 20  (!) 21  Temp:      TempSrc:      SpO2: 96% 93% 94% 95%  Weight:      Height:        Intake/Output Summary (Last 24 hours) at 03/06/2021 1639 Last data filed at 03/06/2021 1300 Gross per 24 hour  Intake 1 ml  Output --  Net 1 ml   Filed Weights   03/05/21 1518  Weight: (!) 149.2 kg    Examination:   Constitutional: NAD, AAOx3 HEENT: conjunctivae and lids normal, EOMI CV: No cyanosis.   RESP: normal respiratory effort, started coughing with deep breathing, on 2L Extremities: No effusions, edema in BLE SKIN: warm, dry Neuro: II - XII grossly intact.  Psych: Normal mood and affect.  Appropriate judgement and reason   Data Reviewed: I have personally reviewed following labs and imaging studies  CBC: Recent Labs  Lab 03/05/21 1525  WBC 9.4  HGB 10.6*  HCT 34.2*  MCV 79.9*  PLT 314   Basic Metabolic Panel: Recent Labs  Lab 03/05/21 1525 03/05/21 2139  NA 139  --   K 3.9  --   CL 100  --   CO2 29  --   GLUCOSE 97  --   BUN 15  --   CREATININE 1.11*  --   CALCIUM 9.5  --   MG  --  1.9   GFR: Estimated Creatinine Clearance: 70.8 mL/min (A) (by C-G formula based on SCr of 1.11 mg/dL (H)). Liver Function Tests: Recent Labs  Lab 03/05/21 1525  AST 16  ALT 10  ALKPHOS 71  BILITOT  0.6  PROT 7.1  ALBUMIN 3.2*   No results for input(s): LIPASE, AMYLASE in the last 168 hours. No results for input(s): AMMONIA in the last 168 hours. Coagulation Profile: No results for input(s): INR, PROTIME in the last 168 hours. Cardiac Enzymes: No results for input(s): CKTOTAL, CKMB, CKMBINDEX, TROPONINI in the last 168 hours. BNP (last 3 results) No results for input(s): PROBNP in the last 8760 hours. HbA1C: No results for input(s): HGBA1C in the last 72 hours. CBG: No results for input(s): GLUCAP in the last 168 hours. Lipid Profile: No results for input(s): CHOL, HDL, LDLCALC, TRIG, CHOLHDL, LDLDIRECT in the last 72 hours. Thyroid Function Tests: No results for input(s): TSH, T4TOTAL, FREET4, T3FREE, THYROIDAB in the last 72 hours. Anemia Panel: No results for input(s): VITAMINB12, FOLATE, FERRITIN, TIBC, IRON, RETICCTPCT in the last 72 hours. Sepsis Labs: No results for input(s): PROCALCITON, LATICACIDVEN in the last 168 hours.  Recent Results (from the past 240 hour(s))  Resp Panel by RT-PCR (Flu A&B, Covid) Nasopharyngeal Swab     Status: None   Collection Time: 03/06/21 12:14 AM   Specimen: Nasopharyngeal Swab; Nasopharyngeal(NP) swabs in vial transport medium  Result Value Ref Range Status   SARS Coronavirus 2 by RT PCR NEGATIVE NEGATIVE Final    Comment: (NOTE) SARS-CoV-2 target nucleic acids are NOT DETECTED.  The SARS-CoV-2 RNA is generally detectable in upper respiratory specimens during the acute phase of infection. The lowest concentration of SARS-CoV-2 viral copies this assay can detect is 138 copies/mL. A negative result does not preclude SARS-Cov-2 infection and should not be used as the sole basis for treatment or other patient management decisions. A negative result may occur with  improper specimen collection/handling, submission of specimen other than nasopharyngeal swab, presence of viral mutation(s) within the areas targeted by this assay, and  inadequate number of viral copies(<138 copies/mL). A negative result must be combined with clinical observations, patient history, and epidemiological information. The expected result is Negative.  Fact Sheet for Patients:  EntrepreneurPulse.com.au  Fact Sheet for Healthcare Providers:  IncredibleEmployment.be  This test is no t yet approved or cleared by the [place] FDA and  has been authorized for detection and/or diagnosis of SARS-CoV-2 by FDA under an Emergency Use Authorization (EUA). This EUA will remain  in effect (meaning this test can be used) for the duration of the COVID-19 declaration under Section 564(b)(1) of the Act, 21 U.S.C.section 360bbb-3(b)(1), unless the authorization is terminated  or revoked sooner.       Influenza A by PCR NEGATIVE NEGATIVE Final   Influenza B by PCR NEGATIVE NEGATIVE  Final    Comment: (NOTE) The Xpert Xpress SARS-CoV-2/FLU/RSV plus assay is intended as an aid in the diagnosis of influenza from Nasopharyngeal swab specimens and should not be used as a sole basis for treatment. Nasal washings and aspirates are unacceptable for Xpert Xpress SARS-CoV-2/FLU/RSV testing.  Fact Sheet for Patients: EntrepreneurPulse.com.au  Fact Sheet for Healthcare Providers: IncredibleEmployment.be  This test is not yet approved or cleared by the [place] FDA and has been authorized for detection and/or diagnosis of SARS-CoV-2 by FDA under an Emergency Use Authorization (EUA). This EUA will remain in effect (meaning this test can be used) for the duration of the COVID-19 declaration under Section 564(b)(1) of the Act, 21 U.S.C. section 360bbb-3(b)(1), unless the authorization is terminated or revoked.  Performed at [facility], [address] [place], [place] [address]       Radiology Studies: DG Chest 2 View  Result Date: 03/05/2021 CLINICAL DATA:   Worsening shortness of breath. EXAM: CHEST - 2 VIEW COMPARISON:  CTA chest February 13, 2021 FINDINGS: The heart size and mediastinal contours are within normal limits. Chronic bronchitic lung changes. No focal airspace consolidation. Unchanged elevation/eventration of the right hemidiaphragm. No pleural effusion. No pneumothorax. The visualized skeletal structures are unremarkable. IMPRESSION: Chronic bronchitic lung changes.  No focal consolidation. Electronically Signed   By: Dahlia Bailiff M.D.   On: 03/05/2021 16:28     Scheduled Meds:  enoxaparin (LOVENOX) injection  0.5 mg/kg Subcutaneous Q24H   furosemide  20 mg Oral Daily   ipratropium-albuterol  3 mL Nebulization Q6H WA   lisinopril  20 mg Oral Daily   methylPREDNISolone (SOLU-MEDROL) injection  40 mg Intravenous Q12H   Followed by   Derrill Memo ON 03/07/2021] predniSONE  40 mg Oral Q breakfast   Continuous Infusions:   LOS: 0 days   No charge note.  Enzo Bi, MD [facility] If 7PM-7AM, please contact night-coverage 03/06/2021, 4:39 PM

## 2021-03-06 NOTE — H&P (Signed)
History and Physical    Samantha Mason BPZ:[id number] DOB: 01/19/1951 DOA: 03/05/2021  PCP: Glean Hess, MD   Patient coming from: home  I have personally briefly reviewed patient's old medical records in [place]  Chief Complaint: shortness of breath  HPI: Samantha Mason is a 70 y.o. female with medical history significant for COPD, OSA on CPAP, HTN, chronic diastolic heart failure, sent in by her pulmonologist because of continued breathing difficulty and O2 sats of 88% while at rest.  Patient was recently evaluated by her cardiologist and had a negative cardiac stress test.  Also states she had a negative PET scan.  States her CPAP machine is not working.  She went to her pulmonologist office today and they sent her in to the ED i for further evaluation patient reports chest tightness associated with wheezing dyspnea on exertion and lightheadedness.  Denies nausea vomiting or diaphoresis.  Denies cough, fever or chills, abdominal pain vomiting or diarrhea  ED course: Vitals with blood pressure 163/71, tachypneic to 22 with O2 sat 97% on room air Blood work for the most part unremarkable  EKG, personally reviewed and interpreted: Sinus rhythm at 82 with nonspecific ST-T wave changes  Imaging: Chest x-ray with chronic bronchitic lung changes  Patient treated with duo nebs, Solu-Medrol and IV magnesium and hospitalist consulted for admission.  Review of Systems: As per HPI otherwise all other systems on review of systems negative.    Past Medical History:  Diagnosis Date   Acute bronchitis    Anemia    Anxiety and depression    Arthritis    Asthma    B12 deficiency 03/14/2017   Chest pain 07/02/2018   COPD (chronic obstructive pulmonary disease) ([place]) 07/24/2018   Family history of blood clots 07/16/2017   Irritable bowel syndrome    Malignant hypertension    Obesity    OSA (obstructive sleep apnea)    uses cpap   Pneumonia 2017   Restless leg syndrome     Stroke ([place]) 1995   mild    Past Surgical History:  Procedure Laterality Date   CHOLECYSTECTOMY     COLONOSCOPY WITH PROPOFOL N/A 03/31/2018   Procedure: COLONOSCOPY WITH PROPOFOL;  Surgeon: Lucilla Lame, MD;  Location: [facility] ENDOSCOPY;  Service: Endoscopy;  Laterality: N/A;   KNEE ARTHROPLASTY Right 04/30/2019   Procedure: COMPUTER ASSISTED TOTAL KNEE ARTHROPLASTY;  Surgeon: Dereck Leep, MD;  Location: [facility] ORS;  Service: Orthopedics;  Laterality: Right;   saliva gland removal  1998   TOTAL KNEE ARTHROPLASTY Left 2014   TUBAL LIGATION Bilateral      reports that she has never smoked. She has never used smokeless tobacco. She reports that she does not drink alcohol and does not use drugs.  Allergies  Allergen Reactions   Nuvigil [Armodafinil] Hives   Penicillins Diarrhea and Nausea And Vomiting    Did it involve swelling of the face/tongue/throat, SOB, or low BP? no Did it involve sudden or severe rash/hives, skin peeling, or any reaction on the inside of your mouth or nose? No Did you need to seek medical attention at a hospital or doctor's office? No When did it last happen?  in her 59s    If all above answers are "NO", may proceed with cephalosporin use.    Gabapentin Other (See Comments)    "wiped her out, couldn't stay awake"   Provigil [Modafinil] Hives    Family History  Problem Relation Age of Onset  Dementia Mother    COPD Father        14   Diabetes Father    Heart failure Brother    Coronary artery disease Brother 68       CABG   Heart failure Maternal Grandmother    Stroke Maternal Grandmother    Breast cancer Neg Hx       Prior to Admission medications   Medication Sig Start Date End Date Taking? Authorizing Provider  albuterol (ACCUNEB) 1.25 MG/3ML nebulizer solution Take 3 mLs (1.25 mg total) by nebulization every 6 (six) hours as needed for wheezing. 09/22/20   Rai, Ripudeep K, MD  azithromycin (ZITHROMAX) 250 MG tablet TAKE 2 TABLETS BY MOUTH  TODAY, THEN TAKE 1 TABLET DAILY FOR 4 DAYS 02/28/21   [provider]  Budeson-Glycopyrrol-Formoterol (BREZTRI AEROSPHERE) 160-9-4.8 MCG/ACT AERO Inhale 2 puffs into the lungs in the morning and at bedtime. 02/07/21   Tyler Pita, MD  calcium-vitamin D (OSCAL 500/200 D-3) 500-200 MG-UNIT tablet Take 1 tablet by mouth daily with breakfast.    [provider]  cetirizine (ZYRTEC) 10 MG tablet Take 1 tablet (10 mg total) by mouth daily. 12/07/14   Ashok Norris, MD  cholestyramine (QUESTRAN) 4 g packet Take 1 packet (4 g total) and drink by mouth at bedtime. 07/12/20   Glean Hess, MD  furosemide (LASIX) 20 MG tablet Take 1 tablet (20 mg total) by mouth daily. 01/09/21 04/09/21  Furth, Cadence H, PA-C  ipratropium-albuterol (DUONEB) 0.5-2.5 (3) MG/3ML SOLN Take 3 mLs by nebulization every 4 (four) hours as needed. 02/15/21 03/17/21  Flora Lipps, MD  lisinopril (ZESTRIL) 10 MG tablet TAKE 1 TABLET BY MOUTH DAILY. 07/12/20 07/12/21  Glean Hess, MD  multivitamin-iron-minerals-folic acid (CENTRUM) chewable tablet Chew 1 tablet by mouth daily.    [provider]  pantoprazole (PROTONIX) 40 MG tablet TAKE 1 TABLET BY MOUTH DAILY 09/26/20 09/26/21  Glean Hess, MD  promethazine (PHENERGAN) 25 MG tablet TAKE 1 TABLET BY MOUTH EVERY 6 HOURS AS NEEDED FOR NAUSEA OR VOMITING. 02/27/21 02/27/22  Glean Hess, MD  rOPINIRole (REQUIP) 1 MG tablet Take 1 tablet (1 mg total) by mouth 3 (three) times daily as needed. 02/20/21   Glean Hess, MD    Physical Exam: Vitals:   03/05/21 1518 03/05/21 2128 03/05/21 2141 03/06/21 0000  BP: (!) 163/71 (!) 156/85  (!) 177/144  Pulse: 86 89  (!) 106  Resp: (!) 22 (!) 21  17  Temp: 98.3 F (36.8 C)  98.7 F (37.1 C)   TempSrc: Oral  Oral   SpO2: 97% 99%  100%  Weight: (!) 149.2 kg     Height: 5\' 4"  (1.626 m)        Vitals:   03/05/21 1518 03/05/21 2128 03/05/21 2141 03/06/21 0000  BP: (!) 163/71 (!) 156/85   (!) 177/144  Pulse: 86 89  (!) 106  Resp: (!) 22 (!) 21  17  Temp: 98.3 F (36.8 C)  98.7 F (37.1 C)   TempSrc: Oral  Oral   SpO2: 97% 99%  100%  Weight: (!) 149.2 kg     Height: 5\' 4"  (1.626 m)         Constitutional: Alert and oriented x 3 . Not in any apparent distress HEENT:      Head: Normocephalic and atraumatic.         Eyes: PERLA, EOMI, Conjunctivae are normal. Sclera is non-icteric.  Mouth/Throat: Mucous membranes are moist.       Neck: Supple with no signs of meningismus. Cardiovascular: Regular rate and rhythm. No murmurs, gallops, or rubs. 2+ symmetrical distal pulses are present . No JVD.  1-2+ edema Respiratory: Respiratory effort slightly increased with decreased air movement and few scattered wheezes Gastrointestinal: Soft, non tender, and non distended with positive bowel sounds.  Genitourinary: No CVA tenderness. Musculoskeletal: Nontender with normal range of motion in all extremities. No cyanosis, or erythema of extremities. Neurologic:  Face is symmetric. Moving all extremities. No gross focal neurologic deficits . Skin: Skin is warm, dry.  No rash or ulcers Psychiatric: Mood and affect are normal    Labs on Admission: I have personally reviewed following labs and imaging studies  CBC: Recent Labs  Lab 03/05/21 1525  WBC 9.4  HGB 10.6*  HCT 34.2*  MCV 79.9*  PLT 834   Basic Metabolic Panel: Recent Labs  Lab 03/05/21 1525 03/05/21 2139  NA 139  --   K 3.9  --   CL 100  --   CO2 29  --   GLUCOSE 97  --   BUN 15  --   CREATININE 1.11*  --   CALCIUM 9.5  --   MG  --  1.9   GFR: Estimated Creatinine Clearance: 70.8 mL/min (A) (by C-G formula based on SCr of 1.11 mg/dL (H)). Liver Function Tests: Recent Labs  Lab 03/05/21 1525  AST 16  ALT 10  ALKPHOS 71  BILITOT 0.6  PROT 7.1  ALBUMIN 3.2*   No results for input(s): LIPASE, AMYLASE in the last 168 hours. No results for input(s): AMMONIA in the last 168 hours. Coagulation  Profile: No results for input(s): INR, PROTIME in the last 168 hours. Cardiac Enzymes: No results for input(s): CKTOTAL, CKMB, CKMBINDEX, TROPONINI in the last 168 hours. BNP (last 3 results) No results for input(s): PROBNP in the last 8760 hours. HbA1C: No results for input(s): HGBA1C in the last 72 hours. CBG: No results for input(s): GLUCAP in the last 168 hours. Lipid Profile: No results for input(s): CHOL, HDL, LDLCALC, TRIG, CHOLHDL, LDLDIRECT in the last 72 hours. Thyroid Function Tests: No results for input(s): TSH, T4TOTAL, FREET4, T3FREE, THYROIDAB in the last 72 hours. Anemia Panel: No results for input(s): VITAMINB12, FOLATE, FERRITIN, TIBC, IRON, RETICCTPCT in the last 72 hours. Urine analysis:    Component Value Date/Time   COLORURINE STRAW (A) 04/19/2019 1314   APPEARANCEUR CLEAR (A) 04/19/2019 1314   APPEARANCEUR Clear 04/27/2012 0958   LABSPEC 1.008 04/19/2019 1314   LABSPEC 1.014 04/27/2012 0958   PHURINE 5.0 04/19/2019 1314   GLUCOSEU NEGATIVE 04/19/2019 1314   GLUCOSEU Negative 04/27/2012 0958   HGBUR SMALL (A) 04/19/2019 1314   BILIRUBINUR neg 02/19/2021 1040   BILIRUBINUR Negative 04/27/2012 [place] 04/19/2019 1314   PROTEINUR Negative 02/19/2021 1040   PROTEINUR NEGATIVE 04/19/2019 1314   UROBILINOGEN 0.2 02/19/2021 1040   NITRITE neg 02/19/2021 1040   NITRITE NEGATIVE 04/19/2019 1314   LEUKOCYTESUR Negative 02/19/2021 1040   LEUKOCYTESUR NEGATIVE 04/19/2019 1314   LEUKOCYTESUR Negative 04/27/2012 0958    Radiological Exams on Admission: DG Chest 2 View  Result Date: 03/05/2021 CLINICAL DATA:  Worsening shortness of breath. EXAM: CHEST - 2 VIEW COMPARISON:  CTA chest February 13, 2021 FINDINGS: The heart size and mediastinal contours are within normal limits. Chronic bronchitic lung changes. No focal airspace consolidation. Unchanged elevation/eventration of the right hemidiaphragm. No pleural effusion. No pneumothorax.  The  visualized skeletal structures are unremarkable. IMPRESSION: Chronic bronchitic lung changes.  No focal consolidation. Electronically Signed   By: Dahlia Bailiff M.D.   On: 03/05/2021 16:28     Assessment/Plan    COPD with acute exacerbation (HCC) - Scheduled and as needed nebulized bronchodilator treatment - IV steroids - Antitussives - Consider consulting patient's pulmonologist   Hypoxia - O2 sat 97% on room air.  Was 88% on room air in outpatient setting - Suspect multifactorial related to the COPD, OSA and possibly obesity hypoventilation syndrome as well as - Supplemental oxygen and evaluate for home O2 at discharge    Chronic diastolic CHF  -Patient appears euvolemic, and BNP normal at 23 and chest x-ray not consistent with CHF - Continue home lisinopril and furosemide - Last echo April 2022 showed EF 55 to 60%.  Grade 1 diastolic dysfunction    Morbid obesity with BMI of 50.0-59.9, adult ([place]) - This complicates overall prognosis and care    OSA on CPAP - CPAP if desired - Patient reports her machine is broken and she has had trouble getting another 1    DVT prophylaxis: Lovenox  Code Status: full code  Family Communication:  none  Disposition Plan: Back to previous home environment Consults called: none  Status:At the time of admission, it appears that the appropriate admission status for this patient is INPATIENT. This is judged to be reasonable and necessary in order to provide the required intensity of service to ensure the patient's safety given the presenting symptoms, physical exam findings, and initial radiographic and laboratory data in the context of their  Comorbid conditions.   Patient requires inpatient status due to high intensity of service, high risk for further deterioration and high frequency of surveillance required.   I certify that at the point of admission it is my clinical judgment that the patient will require inpatient hospital care spanning beyond  [place] MD [facility]     03/06/2021, 3:08 AM

## 2021-03-06 NOTE — ED Notes (Signed)
[facility] RN aware of assigned bed 

## 2021-03-06 NOTE — ED Notes (Signed)
Patient is resting comfortably. Denies further needs at this time.

## 2021-03-06 NOTE — Progress Notes (Signed)
PHARMACIST - PHYSICIAN COMMUNICATION  CONCERNING:  Enoxaparin (Lovenox) for DVT Prophylaxis    RECOMMENDATION: Patient was prescribed enoxaprin 40mg  q24 hours for VTE prophylaxis.   Filed Weights   03/05/21 1518  Weight: (!) 149.2 kg (329 lb)    Body mass index is 56.47 kg/m.  Estimated Creatinine Clearance: 70.8 mL/min (A) (by C-G formula based on SCr of 1.11 mg/dL (H)).   Based on [place] patient is candidate for enoxaparin 0.5mg /kg TBW SQ every 24 hours based on BMI being >30.  DESCRIPTION: Pharmacy has adjusted enoxaparin dose per [facility] policy.  Patient is now receiving enoxaparin 0.5 mg/kg every 24 hours   Renda Rolls, PharmD, [facility] 03/06/2021 3:23 AM

## 2021-03-07 DIAGNOSIS — I5032 Chronic diastolic (congestive) heart failure: Secondary | ICD-10-CM

## 2021-03-07 DIAGNOSIS — G4733 Obstructive sleep apnea (adult) (pediatric): Secondary | ICD-10-CM | POA: Diagnosis not present

## 2021-03-07 DIAGNOSIS — J4551 Severe persistent asthma with (acute) exacerbation: Secondary | ICD-10-CM | POA: Diagnosis not present

## 2021-03-07 DIAGNOSIS — I10 Essential (primary) hypertension: Secondary | ICD-10-CM | POA: Diagnosis not present

## 2021-03-07 DIAGNOSIS — Z9989 Dependence on other enabling machines and devices: Secondary | ICD-10-CM

## 2021-03-07 MED ORDER — IPRATROPIUM-ALBUTEROL 0.5-2.5 (3) MG/3ML IN SOLN
3.0000 mL | Freq: Three times a day (TID) | RESPIRATORY_TRACT | Status: DC
  Administered 2021-03-08 – 2021-03-10 (×7): 3 mL via RESPIRATORY_TRACT
  Filled 2021-03-07 (×6): qty 3

## 2021-03-07 NOTE — Progress Notes (Signed)
PROGRESS NOTE    Samantha Mason   [id number]:[id number]  DOB: April 22, 1951  PCP: Glean Hess, MD    DOA: 03/05/2021 LOS: 1    Brief Narrative / Hospital Course to Date:   "Samantha Mason is a 70 y.o. female with medical history significant for asthma, OSA on CPAP, HTN, chronic diastolic heart failure, sent in by her pulmonologist because of continued breathing difficulty and O2 sats of 88% while at rest.  Patient was recently evaluated by her cardiologist and had a negative cardiac stress test.  Also states she had a negative PET scan.  States her CPAP machine is not working.  She went to her pulmonologist office today and they sent her in to the ED for further evaluation patient reports chest tightness associated with wheezing dyspnea on exertion and lightheadedness."  Assessment & Plan   Principal Problem:   COPD with acute exacerbation ([place]) Active Problems:   Morbid obesity with BMI of 50.0-59.9, adult (HCC)   OSA on CPAP   Chronic diastolic CHF (congestive heart failure) ([place])   Acute hypoxemic respiratory failure due to Acute exacerbation of severe persistent asthma Patient noted with spO2 88% on room air in pulmonology clinic. Placed on 2L and sent to the ED. No prior hx of COPD and patient is a never smoker. Follows with pulmonology for her asthma and OSA. No fever, no leukocytosis, CXR no signs of acute infection or pulm edema.   Pt was treated in ED with IV mag, IV solumedrol and DuoNeb. --Continue steroids, now transitioned to prednisone. --Continue scheduled Duonebs, PRN albuterol  --Supplement O2 to keep sats >=92%, wean as tolerated --Supportive care: antitussives, pulmonary hygiene   OSA on CPAP - Patient reports her machine is broken and she has had trouble getting another  --CPAP nightly --TOC to check with [facility] to replace home machine  Chronic diastolic CHF - pt appears euvolemic, and BNP normal at 23 and chest x-ray not consistent with CHF.     Echo in April 2022 showed EF 55 to 60%.  Grade 1 diastolic dysfunction. --Cont home Lasix 20 mg daily (Note: pt reports taking it PRN for weight gain) --Daily weights --Monitor renal function and electrolytes   Essential Hypertension - BP elevated on admission --Continue lisinopril at increased dose of 20 mg daily --Continue home Lasix  Morbid obesity: Body mass index is 56.47 kg/m.  Complicates overall care and prognosis.  Recommend lifestyle modifications including physical activity and diet for weight loss and overall long-term health. Body habitus affects respiratory mechanics, contributing to OSA, may also have OHS.    DVT prophylaxis: Lovenox   Diet:  Diet Orders (From admission, onward)     Start     Ordered   03/06/21 0314  Diet Heart Room service appropriate? Yes; Fluid consistency: Thin  Diet effective now       Question Answer Comment  Room service appropriate? Yes   Fluid consistency: Thin      03/06/21 0314              Code Status: Full Code   Subjective 03/07/21    Pt up in recliner when seen on rounds this morning.  She reports overall feeling better but gets quite short of breath even with talking.  Currently with out nasal cannula in place her oxygen sat in the mid 90s.  She reports ongoing dry cough but denies fevers or chills.  Confirms her CPAP machine is nonfunctioning and her breathing has progressively  worsened without be able to use this at home.   Disposition Plan & Communication   Status is: Inpatient  Remains inpatient appropriate because: Severity of illness with significant conversational dyspnea    Family Communication: None at bedside   Consults, Procedures, Significant Events   Consultants:  None  Procedures:  None  Antimicrobials:  Anti-infectives (From admission, onward)    None         Micro    Objective   Vitals:   03/07/21 0628 03/07/21 0741 03/07/21 0945 03/07/21 1157  BP: (!) 136/56 (!) 134/53 136/60  (!) 143/59  Pulse: 97 83  81  Resp: 20 20  20   Temp: 98.3 F (36.8 C) 97.8 F (36.6 C)  (!) 97.5 F (36.4 C)  TempSrc: Oral Oral  Oral  SpO2: 100% 100%  95%  Weight:      Height:        Intake/Output Summary (Last 24 hours) at 03/07/2021 1326 Last data filed at 03/07/2021 1028 Gross per 24 hour  Intake 240 ml  Output --  Net 240 ml   Filed Weights   03/05/21 1518  Weight: (!) 149.2 kg    Physical Exam:  General exam: awake, alert, no acute distress, morbidly obese HEENT: atraumatic, clear conjunctiva, anicteric sclera, moist mucus membranes, hearing grossly normal  Respiratory system: Lungs overall clear with poor aeration, no expiratory wheezes heard, no rhonchi, marked conversational dyspnea. Cardiovascular system: normal S1/S2, RRR, nonpitting lower extremity edema.   Gastrointestinal system: soft, NT, ND, no HSM felt, +bowel sounds. Central nervous system: A&O x3. no gross focal neurologic deficits, normal speech Skin: dry, intact, normal temperature Psychiatry: normal mood, congruent affect, judgement and insight appear normal  Labs   Data Reviewed: I have personally reviewed following labs and imaging studies  CBC: Recent Labs  Lab 03/05/21 1525  WBC 9.4  HGB 10.6*  HCT 34.2*  MCV 79.9*  PLT 814   Basic Metabolic Panel: Recent Labs  Lab 03/05/21 1525 03/05/21 2139  NA 139  --   K 3.9  --   CL 100  --   CO2 29  --   GLUCOSE 97  --   BUN 15  --   CREATININE 1.11*  --   CALCIUM 9.5  --   MG  --  1.9   GFR: Estimated Creatinine Clearance: 70.8 mL/min (A) (by C-G formula based on SCr of 1.11 mg/dL (H)). Liver Function Tests: Recent Labs  Lab 03/05/21 1525  AST 16  ALT 10  ALKPHOS 71  BILITOT 0.6  PROT 7.1  ALBUMIN 3.2*   No results for input(s): LIPASE, AMYLASE in the last 168 hours. No results for input(s): AMMONIA in the last 168 hours. Coagulation Profile: No results for input(s): INR, PROTIME in the last 168 hours. Cardiac  Enzymes: No results for input(s): CKTOTAL, CKMB, CKMBINDEX, TROPONINI in the last 168 hours. BNP (last 3 results) No results for input(s): PROBNP in the last 8760 hours. HbA1C: No results for input(s): HGBA1C in the last 72 hours. CBG: No results for input(s): GLUCAP in the last 168 hours. Lipid Profile: No results for input(s): CHOL, HDL, LDLCALC, TRIG, CHOLHDL, LDLDIRECT in the last 72 hours. Thyroid Function Tests: No results for input(s): TSH, T4TOTAL, FREET4, T3FREE, THYROIDAB in the last 72 hours. Anemia Panel: No results for input(s): VITAMINB12, FOLATE, FERRITIN, TIBC, IRON, RETICCTPCT in the last 72 hours. Sepsis Labs: No results for input(s): PROCALCITON, LATICACIDVEN in the last 168 hours.  Recent Results (from the  past 240 hour(s))  Resp Panel by RT-PCR (Flu A&B, Covid) Nasopharyngeal Swab     Status: None   Collection Time: 03/06/21 12:14 AM   Specimen: Nasopharyngeal Swab; Nasopharyngeal(NP) swabs in vial transport medium  Result Value Ref Range Status   SARS Coronavirus 2 by RT PCR NEGATIVE NEGATIVE Final    Comment: (NOTE) SARS-CoV-2 target nucleic acids are NOT DETECTED.  The SARS-CoV-2 RNA is generally detectable in upper respiratory specimens during the acute phase of infection. The lowest concentration of SARS-CoV-2 viral copies this assay can detect is 138 copies/mL. A negative result does not preclude SARS-Cov-2 infection and should not be used as the sole basis for treatment or other patient management decisions. A negative result may occur with  improper specimen collection/handling, submission of specimen other than nasopharyngeal swab, presence of viral mutation(s) within the areas targeted by this assay, and inadequate number of viral copies(<138 copies/mL). A negative result must be combined with clinical observations, patient history, and epidemiological information. The expected result is Negative.  Fact Sheet for Patients:   EntrepreneurPulse.com.au  Fact Sheet for Healthcare Providers:  IncredibleEmployment.be  This test is no t yet approved or cleared by the [place] FDA and  has been authorized for detection and/or diagnosis of SARS-CoV-2 by FDA under an Emergency Use Authorization (EUA). This EUA will remain  in effect (meaning this test can be used) for the duration of the COVID-19 declaration under Section 564(b)(1) of the Act, 21 U.S.C.section 360bbb-3(b)(1), unless the authorization is terminated  or revoked sooner.       Influenza A by PCR NEGATIVE NEGATIVE Final   Influenza B by PCR NEGATIVE NEGATIVE Final    Comment: (NOTE) The Xpert Xpress SARS-CoV-2/FLU/RSV plus assay is intended as an aid in the diagnosis of influenza from Nasopharyngeal swab specimens and should not be used as a sole basis for treatment. Nasal washings and aspirates are unacceptable for Xpert Xpress SARS-CoV-2/FLU/RSV testing.  Fact Sheet for Patients: EntrepreneurPulse.com.au  Fact Sheet for Healthcare Providers: IncredibleEmployment.be  This test is not yet approved or cleared by the [place] FDA and has been authorized for detection and/or diagnosis of SARS-CoV-2 by FDA under an Emergency Use Authorization (EUA). This EUA will remain in effect (meaning this test can be used) for the duration of the COVID-19 declaration under Section 564(b)(1) of the Act, 21 U.S.C. section 360bbb-3(b)(1), unless the authorization is terminated or revoked.  Performed at [facility], [address] [place], [place] [address]       Imaging Studies   DG Chest 2 View  Result Date: 03/05/2021 CLINICAL DATA:  Worsening shortness of breath. EXAM: CHEST - 2 VIEW COMPARISON:  CTA chest February 13, 2021 FINDINGS: The heart size and mediastinal contours are within normal limits. Chronic bronchitic lung changes. No focal airspace  consolidation. Unchanged elevation/eventration of the right hemidiaphragm. No pleural effusion. No pneumothorax. The visualized skeletal structures are unremarkable. IMPRESSION: Chronic bronchitic lung changes.  No focal consolidation. Electronically Signed   By: Dahlia Bailiff M.D.   On: 03/05/2021 16:28     Medications   Scheduled Meds:  chlorhexidine  15 mL Mouth Rinse BID   cholestyramine  4 g Oral QHS   enoxaparin (LOVENOX) injection  0.5 mg/kg Subcutaneous Q24H   furosemide  20 mg Oral Daily   ipratropium-albuterol  3 mL Nebulization Q6H WA   lisinopril  20 mg Oral Daily   mouth rinse  15 mL Mouth Rinse q12n4p   pantoprazole  40 mg Oral  Daily   predniSONE  40 mg Oral Q breakfast   rOPINIRole  1 mg Oral QHS   Continuous Infusions:     LOS: 1 day    Time spent: 30 minutes    Ezekiel Slocumb, DO [facility]  03/07/2021, 1:26 PM      If 7PM-7AM, please contact night-coverage. How to contact the [facility] Attending or Consulting provider [place] or covering provider during after hours [place], for this patient?    Check the care team in [facility] and look for a) attending/consulting TRH provider listed and b) the [facility] team listed Log into [email or web address] and use [place]'s universal password to access. If you do not have the password, please contact the hospital operator. Locate the [facility] provider you are looking for under [facility] and page to a number that you can be directly reached. If you still have difficulty reaching the provider, please page the [facility] (Director on Call) for the Hospitalists listed on amion for assistance.

## 2021-03-08 ENCOUNTER — Inpatient Hospital Stay: Payer: Medicare Other

## 2021-03-08 DIAGNOSIS — I5032 Chronic diastolic (congestive) heart failure: Secondary | ICD-10-CM | POA: Diagnosis not present

## 2021-03-08 DIAGNOSIS — J4551 Severe persistent asthma with (acute) exacerbation: Secondary | ICD-10-CM | POA: Diagnosis not present

## 2021-03-08 DIAGNOSIS — I10 Essential (primary) hypertension: Secondary | ICD-10-CM | POA: Diagnosis not present

## 2021-03-08 LAB — BASIC METABOLIC PANEL
Anion gap: 7 (ref 5–15)
BUN: 30 mg/dL — ABNORMAL HIGH (ref 8–23)
CO2: 29 mmol/L (ref 22–32)
Calcium: 8.9 mg/dL (ref 8.9–10.3)
Chloride: 94 mmol/L — ABNORMAL LOW (ref 98–111)
Creatinine, Ser: 1.45 mg/dL — ABNORMAL HIGH (ref 0.44–1.00)
GFR, Estimated: 39 mL/min — ABNORMAL LOW (ref >60)
Glucose, Bld: 97 mg/dL (ref 70–99)
Potassium: 4 mmol/L (ref 3.5–5.1)
Sodium: 130 mmol/L — ABNORMAL LOW (ref 135–145)

## 2021-03-08 LAB — D-DIMER, QUANTITATIVE: D-Dimer, Quant: 0.71 ug/mL-FEU — ABNORMAL HIGH (ref 0.00–0.50)

## 2021-03-08 LAB — TROPONIN I (HIGH SENSITIVITY): Troponin I (High Sensitivity): 9 ng/L (ref <18)

## 2021-03-08 LAB — MAGNESIUM: Magnesium: 1.9 mg/dL (ref 1.7–2.4)

## 2021-03-08 LAB — BRAIN NATRIURETIC PEPTIDE: B Natriuretic Peptide: 65.1 pg/mL (ref 0.0–100.0)

## 2021-03-08 LAB — OSMOLALITY: Osmolality: 285 mOsm/kg (ref 275–295)

## 2021-03-08 MED ORDER — BUDESONIDE-FORMOTEROL FUMARATE 160-4.5 MCG/ACT IN AERO
2.0000 | INHALATION_SPRAY | Freq: Two times a day (BID) | RESPIRATORY_TRACT | Status: DC
  Filled 2021-03-08: qty 6

## 2021-03-08 MED ORDER — FUROSEMIDE 10 MG/ML IJ SOLN
40.0000 mg | Freq: Once | INTRAMUSCULAR | Status: AC
  Administered 2021-03-08: 40 mg via INTRAVENOUS
  Filled 2021-03-08: qty 4

## 2021-03-08 MED ORDER — HYDRALAZINE HCL 50 MG PO TABS
25.0000 mg | ORAL_TABLET | Freq: Four times a day (QID) | ORAL | Status: DC | PRN
  Administered 2021-03-08: 25 mg via ORAL
  Filled 2021-03-08: qty 1

## 2021-03-08 MED ORDER — SODIUM CHLORIDE 0.9 % IV SOLN: INTRAVENOUS | Status: DC

## 2021-03-08 MED ORDER — ROPINIROLE HCL 1 MG PO TABS
1.0000 mg | ORAL_TABLET | Freq: Three times a day (TID) | ORAL | Status: DC
  Administered 2021-03-08 – 2021-03-10 (×7): 1 mg via ORAL
  Filled 2021-03-08 (×7): qty 1

## 2021-03-08 MED ORDER — HOME MED STORE IN PYXIS
1.0000 | Freq: Two times a day (BID) | Status: DC
  Administered 2021-03-08: 1 via RESPIRATORY_TRACT

## 2021-03-08 MED ORDER — LORATADINE 10 MG PO TABS
10.0000 mg | ORAL_TABLET | Freq: Every day | ORAL | Status: DC
  Administered 2021-03-08 – 2021-03-10 (×3): 10 mg via ORAL
  Filled 2021-03-08 (×3): qty 1

## 2021-03-08 NOTE — Progress Notes (Signed)
PROGRESS NOTE    Samantha Mason   QVZ:[id number]  DOB: 1951/04/27  PCP: Glean Hess, MD    DOA: 03/05/2021 LOS: 2    Brief Narrative / Hospital Course to Date:   "Samantha Mason is a 70 y.o. female with medical history significant for asthma, OSA on CPAP, HTN, chronic diastolic heart failure, sent in by her pulmonologist because of continued breathing difficulty and O2 sats of 88% while at rest.  Patient was recently evaluated by her cardiologist and had a negative cardiac stress test.  Also states she had a negative PET scan.  States her CPAP machine is not working.  She went to her pulmonologist office today and they sent her in to the ED for further evaluation patient reports chest tightness associated with wheezing dyspnea on exertion and lightheadedness."  Assessment & Plan   Principal Problem:   Asthma exacerbation Active Problems:   GERD (gastroesophageal reflux disease)   Essential hypertension   IBS (irritable bowel syndrome)   Allergic rhinitis   Restless leg syndrome   Severe persistent asthma   OSA on CPAP   Chronic diastolic CHF (congestive heart failure) ([place])   Morbid obesity with BMI of 50.0-59.9, adult ([place])   Acute hypoxemic respiratory failure due to Acute exacerbation of severe persistent asthma Patient noted with spO2 88% on room air in pulmonology clinic. Placed on 2L and sent to the ED. No prior hx of COPD and patient is a never smoker. Follows with pulmonology for her asthma and OSA. No fever, no leukocytosis, CXR no signs of acute infection or pulm edema.   Pt was treated in ED with IV mag, IV solumedrol and DuoNeb. --Continue steroids, now transitioned to prednisone. --Continue scheduled Duonebs, PRN albuterol  --Resume home Symbicort (Of note, patient gets sore throat from other inhaled corticosteroids, patient's own med order placed) --Supplement O2 to keep sats >=92%, wean as tolerated --Supportive care: antitussives, pulmonary  hygiene  10/20: This afternoon patient reportedly more dyspneic, reporting chest tightness and placed back on oxygen. --Evaluation pending: Chest x-ray, D-dimer, BNP and troponin --Stop IV fluids --Next steps based on above results, suspect needs diuresis  AKI -not present on admission.  Creatinine increased from 1.11 up to 1.45 today. Suspect this is prerenal azotemia and mild dehydration. -- Hold Lasix and lisinopril --IV fluids were started but stopped this afternoon due to above  Hyponatremia -not present on admission.  10/20: Sodium declined from 139 down to 130.   Normal serum osmolality.  Volume status difficult to assess.   Started on some IV fluids for mild dehydration but later stopped due to above --Follow BMP  OSA on CPAP - Patient reports her machine is broken and she has had trouble getting another  --CPAP nightly --TOC to check with [place] to replace home machine  Chronic diastolic CHF - pt appears euvolemic, and BNP normal at 23 and chest x-ray not consistent with CHF.    Echo in April 2022 showed EF 55 to 60%.  Grade 1 diastolic dysfunction. -- Hold home Lasix 20 mg daily due to AKI (Note: pt reports taking it PRN for weight gain) --Daily weights --Monitor renal function and electrolytes   Essential Hypertension - BP elevated on admission -- Hold lisinopril due to AKI (dose was increased dose of 20 mg daily) -- Hold home Lasix due to AKI  Morbid obesity: Body mass index is 56.38 kg/m.  Complicates overall care and prognosis.  Recommend lifestyle modifications including physical activity and  diet for weight loss and overall long-term health. Body habitus affects respiratory mechanics, contributing to OSA, may also have OHS.    DVT prophylaxis: Lovenox   Diet:  Diet Orders (From admission, onward)     Start     Ordered   03/06/21 0314  Diet Heart Room service appropriate? Yes; Fluid consistency: Thin  Diet effective now       Question Answer Comment   Room service appropriate? Yes   Fluid consistency: Thin      03/06/21 0314              Code Status: Full Code   Subjective 03/08/21    Pt up in recliner when seen on rounds this morning.  Reports dyspnea on exertion improving, takes less time to recover.  Still gets rapid heart rate and quite short of breath when up to ambulate to the bathroom.  Throat still feels raspy.  No fevers or chills.  Did not tolerate CPAP machine here last night due to too much pressure even when it was adjusted lower.  Her machine at home auto titrates while she is asleep.   Disposition Plan & Communication   Status is: Inpatient  Remains inpatient appropriate because: Severity of illness with ongoing dyspnea, and now with AKI and hyponatremia.    Family Communication: None at bedside   Consults, Procedures, Significant Events   Consultants:  None  Procedures:  None  Antimicrobials:  Anti-infectives (From admission, onward)    None         Micro    Objective   Vitals:   03/08/21 0300 03/08/21 0436 03/08/21 0825 03/08/21 1136  BP:  (!) 150/66 (!) 141/70 (!) 151/62  Pulse:  86 95 86  Resp:  17 20 20   Temp:  (!) 97.5 F (36.4 C) 98.2 F (36.8 C) 98.2 F (36.8 C)  TempSrc:  Oral Oral Oral  SpO2:  91% 93% 91%  Weight: (!) 149 kg     Height:        Intake/Output Summary (Last 24 hours) at 03/08/2021 1408 Last data filed at 03/08/2021 1200 Gross per 24 hour  Intake 702.48 ml  Output --  Net 702.48 ml   Filed Weights   03/05/21 1518 03/08/21 0300  Weight: (!) 149.2 kg (!) 149 kg    Physical Exam:  General exam: awake, alert, no acute distress, morbidly obese Respiratory system: Lungs overall clear with poor but improved aeration, no expiratory wheezes heard, no rhonchi, no conversational dyspnea noted today, on room air. Cardiovascular system: normal S1/S2, RRR, nonpitting lower extremity edema stable.   Central nervous system: A&O x3. no gross focal neurologic  deficits, normal speech Skin: dry, intact, normal temperature Psychiatry: normal mood, congruent affect, judgement and insight appear normal  Labs   Data Reviewed: I have personally reviewed following labs and imaging studies  CBC: Recent Labs  Lab 03/05/21 1525  WBC 9.4  HGB 10.6*  HCT 34.2*  MCV 79.9*  PLT 601   Basic Metabolic Panel: Recent Labs  Lab 03/05/21 1525 03/05/21 2139 03/08/21 0545  NA 139  --  130*  K 3.9  --  4.0  CL 100  --  94*  CO2 29  --  29  GLUCOSE 97  --  97  BUN 15  --  30*  CREATININE 1.11*  --  1.45*  CALCIUM 9.5  --  8.9  MG  --  1.9 1.9   GFR: Estimated Creatinine Clearance: 54.2 mL/min (A) (by  C-G formula based on SCr of 1.45 mg/dL (H)). Liver Function Tests: Recent Labs  Lab 03/05/21 1525  AST 16  ALT 10  ALKPHOS 71  BILITOT 0.6  PROT 7.1  ALBUMIN 3.2*   No results for input(s): LIPASE, AMYLASE in the last 168 hours. No results for input(s): AMMONIA in the last 168 hours. Coagulation Profile: No results for input(s): INR, PROTIME in the last 168 hours. Cardiac Enzymes: No results for input(s): CKTOTAL, CKMB, CKMBINDEX, TROPONINI in the last 168 hours. BNP (last 3 results) No results for input(s): PROBNP in the last 8760 hours. HbA1C: No results for input(s): HGBA1C in the last 72 hours. CBG: No results for input(s): GLUCAP in the last 168 hours. Lipid Profile: No results for input(s): CHOL, HDL, LDLCALC, TRIG, CHOLHDL, LDLDIRECT in the last 72 hours. Thyroid Function Tests: No results for input(s): TSH, T4TOTAL, FREET4, T3FREE, THYROIDAB in the last 72 hours. Anemia Panel: No results for input(s): VITAMINB12, FOLATE, FERRITIN, TIBC, IRON, RETICCTPCT in the last 72 hours. Sepsis Labs: No results for input(s): PROCALCITON, LATICACIDVEN in the last 168 hours.  Recent Results (from the past 240 hour(s))  Resp Panel by RT-PCR (Flu A&B, Covid) Nasopharyngeal Swab     Status: None   Collection Time: 03/06/21 12:14 AM    Specimen: Nasopharyngeal Swab; Nasopharyngeal(NP) swabs in vial transport medium  Result Value Ref Range Status   SARS Coronavirus 2 by RT PCR NEGATIVE NEGATIVE Final    Comment: (NOTE) SARS-CoV-2 target nucleic acids are NOT DETECTED.  The SARS-CoV-2 RNA is generally detectable in upper respiratory specimens during the acute phase of infection. The lowest concentration of SARS-CoV-2 viral copies this assay can detect is 138 copies/mL. A negative result does not preclude SARS-Cov-2 infection and should not be used as the sole basis for treatment or other patient management decisions. A negative result may occur with  improper specimen collection/handling, submission of specimen other than nasopharyngeal swab, presence of viral mutation(s) within the areas targeted by this assay, and inadequate number of viral copies(<138 copies/mL). A negative result must be combined with clinical observations, patient history, and epidemiological information. The expected result is Negative.  Fact Sheet for Patients:  EntrepreneurPulse.com.au  Fact Sheet for Healthcare Providers:  IncredibleEmployment.be  This test is no t yet approved or cleared by the [place] FDA and  has been authorized for detection and/or diagnosis of SARS-CoV-2 by FDA under an Emergency Use Authorization (EUA). This EUA will remain  in effect (meaning this test can be used) for the duration of the COVID-19 declaration under Section 564(b)(1) of the Act, 21 U.S.C.section 360bbb-3(b)(1), unless the authorization is terminated  or revoked sooner.       Influenza A by PCR NEGATIVE NEGATIVE Final   Influenza B by PCR NEGATIVE NEGATIVE Final    Comment: (NOTE) The Xpert Xpress SARS-CoV-2/FLU/RSV plus assay is intended as an aid in the diagnosis of influenza from Nasopharyngeal swab specimens and should not be used as a sole basis for treatment. Nasal washings and aspirates are  unacceptable for Xpert Xpress SARS-CoV-2/FLU/RSV testing.  Fact Sheet for Patients: EntrepreneurPulse.com.au  Fact Sheet for Healthcare Providers: IncredibleEmployment.be  This test is not yet approved or cleared by the [place] FDA and has been authorized for detection and/or diagnosis of SARS-CoV-2 by FDA under an Emergency Use Authorization (EUA). This EUA will remain in effect (meaning this test can be used) for the duration of the COVID-19 declaration under Section 564(b)(1) of the Act, 21 U.S.C. section 360bbb-3(b)(1), unless  the authorization is terminated or revoked.  Performed at [facility], [address] [place],  [address]       Imaging Studies   No results found.   Medications   Scheduled Meds:  budesonide-formoterol  2 puff Inhalation BID   chlorhexidine  15 mL Mouth Rinse BID   cholestyramine  4 g Oral QHS   enoxaparin (LOVENOX) injection  0.5 mg/kg Subcutaneous Q24H   ipratropium-albuterol  3 mL Nebulization TID   loratadine  10 mg Oral Daily   mouth rinse  15 mL Mouth Rinse q12n4p   pantoprazole  40 mg Oral Daily   predniSONE  40 mg Oral Q breakfast   rOPINIRole  1 mg Oral TID   Continuous Infusions:     LOS: 2 days    Time spent: 30 minutes with > 50% spent at bedside and in coordination of care     Ezekiel Slocumb, DO [facility]  03/08/2021, 2:08 PM      If 7PM-7AM, please contact night-coverage. How to contact the [facility] Attending or Consulting provider [place] or covering provider during after hours [place], for this patient?    Check the care team in [facility] and look for a) attending/consulting TRH provider listed and b) the [facility] team listed Log into [email or web address] and use [place]'s universal password to access. If you do not have the password, please contact the hospital operator. Locate the [facility] provider you are looking for under [facility] and page to a  number that you can be directly reached. If you still have difficulty reaching the provider, please page the [facility] (Director on Call) for the Hospitalists listed on amion for assistance.

## 2021-03-08 NOTE — Progress Notes (Signed)
Patient was c/o SOB and having tachyphemia. Patient is on 2.5 LPM. Patients O2 sats were 96-97% on the 2.5 LPM. Paged MD, new orders placed. Also gave patient scheduled breathing treatment.

## 2021-03-08 NOTE — TOC Initial Note (Signed)
Transition of Care [facility]) - Initial/Assessment Note    Patient Details  Name: Samantha Mason MRN: [id number] Date of Birth: 09/14/1950  Transition of Care [facility]) CM/SW Contact:    Shelbie Hutching, RN Phone Number: 03/08/2021, 3:25 PM  Clinical Narrative:                 Patient admitted to the hospital with COPD exacerbation.  RNCM met with patient at the bedside.  Patient reports that she is from home where her son lives with her.  Patient is independent with ADL's and recently retired.  Patient reports that she has oxygen at home and a CPAP machine but the CPAP is not working.  Patient has an oxygen concentrator and portable.  Patient's son can come and pick her up at discharge and bring her portable oxygen with him.  Patient does not have and PT follow up recommendations and does not want home health nursing.   RNCM contacted Zach with [employer] about the CPAP not working.  He will check into it.   Expected Discharge Plan: Home/Self Care Barriers to Discharge: Continued Medical Work up   Patient Goals and CMS Choice Patient states their goals for this hospitalization and ongoing recovery are:: To breath better and get back home      Expected Discharge Plan and Services Expected Discharge Plan: Home/Self Care       Living arrangements for the past 2 months: Single Family Home                 DME Arranged: N/A DME Agency: NA       HH Arranged: NA [place] Agency: NA        Prior Living Arrangements/Services Living arrangements for the past 2 months: Single Family Home Lives with:: Adult Children Patient language and need for interpreter reviewed:: Yes Do you feel safe going back to the place where you live?: Yes      Need for Family Participation in Patient Care: Yes (Comment) Care giver support system in place?: Yes (comment) Current home services: DME (oxygen) Criminal Activity/Legal Involvement Pertinent to Current Situation/Hospitalization: No - Comment as  needed  Activities of Daily Living Home Assistive Devices/Equipment: None ADL Screening (condition at time of admission) Patient's cognitive ability adequate to safely complete daily activities?: Yes Is the patient deaf or have difficulty hearing?: No Does the patient have difficulty seeing, even when wearing glasses/contacts?: No Does the patient have difficulty concentrating, remembering, or making decisions?: No Patient able to express need for assistance with ADLs?: Yes Does the patient have difficulty dressing or bathing?: No Independently performs ADLs?: Yes (appropriate for developmental age) Does the patient have difficulty walking or climbing stairs?: Yes Weakness of Legs: None Weakness of Arms/Hands: None  Permission Sought/Granted Permission sought to share information with : Case Manager, Family Supports Permission granted to share information with : Yes, Verbal Permission Granted  Share Information with NAME: Samantha Mason     Permission granted to share info w Relationship: son  Permission granted to share info w Contact Information: [phone]  Emotional Assessment Appearance:: Appears stated age Attitude/Demeanor/Rapport: Engaged Affect (typically observed): Accepting Orientation: : Oriented to Self, Oriented to Place, Oriented to  Time, Oriented to Situation Alcohol / Substance Use: Not Applicable Psych Involvement: No (comment)  Admission diagnosis:  Hypoxia [R09.02] COPD exacerbation (HCC) [J44.1] COPD with acute exacerbation (HCC) [J44.1] Dyspnea, unspecified type [R06.00] Patient Active Problem List   Diagnosis Date Noted   Chronic diastolic CHF (congestive heart failure) (  [place]) 03/06/2021   History of stroke 11/17/2020   Asthma exacerbation 09/19/2020   Ovarian failure 02/21/2020   Primary osteoarthritis of right knee 02/13/2019   S/P total knee arthroplasty, left 02/13/2019   Aortic atherosclerosis ([place]) 08/01/2018   B12 deficiency anemia  07/07/2018   Benign neoplasm of ascending colon    Severe persistent asthma 02/13/2018   Vitamin D deficiency 03/17/2017   Prediabetes 03/14/2017   Microcytic anemia 03/14/2017   IBS (irritable bowel syndrome) 03/12/2017   Allergic rhinitis 03/12/2017   Restless leg syndrome 03/12/2017   GERD (gastroesophageal reflux disease) 10/16/2016   Essential hypertension 10/16/2016   Morbid obesity with BMI of 50.0-59.9, adult ([place]) 12/08/2014   OSA on CPAP 12/08/2014   Shortness of breath 04/07/2014   PCP:  Glean Hess, MD Pharmacy:   [id number] [place], [place] [address] Phone: [phone] Fax: [phone]     Social Determinants of Health (SDOH) Interventions    Readmission Risk Interventions No flowsheet data found.

## 2021-03-08 NOTE — Evaluation (Signed)
Physical Therapy Evaluation Patient Details Name: Samantha Mason MRN: [id number] DOB: 19-Aug-1950 Today's Date: 03/08/2021  History of Present Illness  70 y.o. female with medical history significant for asthma, OSA on CPAP, HTN, chronic diastolic heart failure, sent in by her pulmonologist because of continued breathing difficulty and low O2 sats .  Clinical Impression  Pt did well with ambulation/mobility and was able to ambulate around the nurses' station w/o AD.  She did have significant fatigue with the effort, though O2 did maintain in the low 90s on room air, HR increased from 90s to 120s.  She reports she has generally had to limit activity for the last few months and especially the last week or 2 (needing to use newly issued O2 and doing regular breathing treatments).  PT moves well and has good strength, no real PT needs but clearly far from her baseline regarding activity tolerance and likely needing further pulmonary work up.      Recommendations for follow up therapy are one component of a multi-disciplinary discharge planning process, led by the attending physician.  Recommendations may be updated based on patient status, additional functional criteria and insurance authorization.  Follow Up Recommendations No PT follow up    Equipment Recommendations  None recommended by PT    Recommendations for Other Services       Precautions / Restrictions Precautions Precautions: None Restrictions Weight Bearing Restrictions: No      Mobility  Bed Mobility               General bed mobility comments: in recliner on arrival    Transfers Overall transfer level: Independent Equipment used: None             General transfer comment: easily gets to/from standing w/o assist or hesitancy  Ambulation/Gait Ambulation/Gait assistance: Modified independent (Device/Increase time) Gait Distance (Feet): 200 Feet Assistive device: None       General Gait Details: Pt  was able to maintain consistent and safe cadence without AD.  Pt on room air the entire time with sats in the 90-92% range t/o.  Pt's HR did increase with activity to the 120s with increased distance.  She did have significant fatigue with the effort with DOE and needed to return to sitting quickly on return to room.  Stairs            Wheelchair Mobility    Modified Rankin (Stroke Patients Only)       Balance Overall balance assessment: Modified Independent                                           Pertinent Vitals/Pain Pain Assessment: No/denies pain    Home Living Family/patient expects to be discharged to:: Private residence Living Arrangements: Children Available Help at Discharge: Family;Available 24 hours/day Type of Home: House Home Access: Stairs to enter Entrance Stairs-Rails: Chemical engineer of Steps: 2 in front and 3 in back Home Layout: One level Home Equipment: Bedside commode;Walker - 2 wheels;Walker - 4 wheels;Cane - single point Additional Comments: has access to late mom's DME but does not use herself, lives with son    Prior Function Level of Independence: Independent         Comments: Independent with ADLs and functional mobility, however in the last few months and more notedly the last 1-2 weeks she has been able to tolerate less  prolonged activity. No falls. Pt works as Occupational psychologist at [employer] and reported being on her feet/walking "95% of the time".     Hand Dominance        Extremity/Trunk Assessment   Upper Extremity Assessment Upper Extremity Assessment: Overall WFL for tasks assessed    Lower Extremity Assessment Lower Extremity Assessment: Overall WFL for tasks assessed       Communication   Communication: No difficulties  Cognition Arousal/Alertness: Awake/alert Behavior During Therapy: WFL for tasks assessed/performed Overall Cognitive Status: Within Functional Limits for tasks assessed                                         General Comments General comments (skin integrity, edema, etc.): Pt with no overt PT needs but clearly with some activity tolerance and fatigue issues.    Exercises     Assessment/Plan    PT Assessment Patent does not need any further PT services  PT Problem List         PT Treatment Interventions      PT Goals (Current goals can be found in the Care Plan section)  Acute Rehab PT Goals Patient Stated Goal: Get breathing better PT Goal Formulation: All assessment and education complete, DC therapy    Frequency     Barriers to discharge        Co-evaluation               AM-PAC PT "6 Clicks" Mobility  Outcome Measure Help needed turning from your back to your side while in a flat bed without using bedrails?: None Help needed moving from lying on your back to sitting on the side of a flat bed without using bedrails?: None Help needed moving to and from a bed to a chair (including a wheelchair)?: None Help needed standing up from a chair using your arms (e.g., wheelchair or bedside chair)?: None Help needed to walk in hospital room?: None Help needed climbing 3-5 steps with a railing? : None 6 Click Score: 24    End of Session Equipment Utilized During Treatment: Gait belt Activity Tolerance: Patient tolerated treatment well;Patient limited by fatigue Patient left: in chair;with call bell/phone within reach Nurse Communication: Mobility status (vitals with activity) PT Visit Diagnosis: Difficulty in walking, not elsewhere classified (R26.2)    Time: 7858-8502 PT Time Calculation (min) (ACUTE ONLY): 17 min   Charges:   PT Evaluation $PT Eval Low Complexity: 1 Low          Kreg Shropshire, DPT 03/08/2021, 10:11 AM

## 2021-03-09 DIAGNOSIS — I5032 Chronic diastolic (congestive) heart failure: Secondary | ICD-10-CM | POA: Diagnosis not present

## 2021-03-09 DIAGNOSIS — J4551 Severe persistent asthma with (acute) exacerbation: Secondary | ICD-10-CM | POA: Diagnosis not present

## 2021-03-09 DIAGNOSIS — Z9989 Dependence on other enabling machines and devices: Secondary | ICD-10-CM | POA: Diagnosis not present

## 2021-03-09 DIAGNOSIS — G4733 Obstructive sleep apnea (adult) (pediatric): Secondary | ICD-10-CM | POA: Diagnosis not present

## 2021-03-09 LAB — BASIC METABOLIC PANEL
Anion gap: 8 (ref 5–15)
BUN: 30 mg/dL — ABNORMAL HIGH (ref 8–23)
CO2: 33 mmol/L — ABNORMAL HIGH (ref 22–32)
Calcium: 8.9 mg/dL (ref 8.9–10.3)
Chloride: 93 mmol/L — ABNORMAL LOW (ref 98–111)
Creatinine, Ser: 1.61 mg/dL — ABNORMAL HIGH (ref 0.44–1.00)
GFR, Estimated: 35 mL/min — ABNORMAL LOW (ref >60)
Glucose, Bld: 111 mg/dL — ABNORMAL HIGH (ref 70–99)
Potassium: 4.2 mmol/L (ref 3.5–5.1)
Sodium: 134 mmol/L — ABNORMAL LOW (ref 135–145)

## 2021-03-09 MED ORDER — AMLODIPINE BESYLATE 5 MG PO TABS
5.0000 mg | ORAL_TABLET | Freq: Every day | ORAL | Status: DC
  Administered 2021-03-09 – 2021-03-10 (×2): 5 mg via ORAL
  Filled 2021-03-09 (×2): qty 1

## 2021-03-09 MED ORDER — DM-GUAIFENESIN ER 30-600 MG PO TB12
1.0000 | ORAL_TABLET | Freq: Every day | ORAL | Status: DC
  Administered 2021-03-09 – 2021-03-10 (×2): 1 via ORAL
  Filled 2021-03-09 (×2): qty 1

## 2021-03-09 MED ORDER — BUDESONIDE-FORMOTEROL FUMARATE 160-4.5 MCG/ACT IN AERO
2.0000 | INHALATION_SPRAY | Freq: Two times a day (BID) | RESPIRATORY_TRACT | Status: DC
  Administered 2021-03-09 – 2021-03-10 (×2): 2 via RESPIRATORY_TRACT
  Filled 2021-03-09: qty 6

## 2021-03-09 NOTE — Progress Notes (Addendum)
PROGRESS NOTE    Samantha Mason   ENI:[id number]  DOB: 10/19/1950  PCP: Glean Hess, MD    DOA: 03/05/2021 LOS: 3    Brief Narrative / Hospital Course to Date:   "Samantha Mason is a 70 y.o. female with medical history significant for asthma, OSA on CPAP, HTN, chronic diastolic heart failure, sent in by her pulmonologist because of continued breathing difficulty and O2 sats of 88% while at rest.  Patient was recently evaluated by her cardiologist and had a negative cardiac stress test.  Also states she had a negative PET scan.  States her CPAP machine is not working.  She went to her pulmonologist office today and they sent her in to the ED for further evaluation patient reports chest tightness associated with wheezing dyspnea on exertion and lightheadedness."  Assessment & Plan   Principal Problem:   Asthma exacerbation Active Problems:   GERD (gastroesophageal reflux disease)   Essential hypertension   IBS (irritable bowel syndrome)   Allergic rhinitis   Restless leg syndrome   Severe persistent asthma   OSA on CPAP   Chronic diastolic CHF (congestive heart failure) ([place])   Morbid obesity with BMI of 50.0-59.9, adult ([place])   Acute hypoxemic respiratory failure due to Acute exacerbation of severe persistent asthma Patient noted with spO2 88% on room air in pulmonology clinic. Placed on 2L and sent to the ED. No prior hx of COPD and patient is a never smoker. Follows with pulmonology for her asthma and OSA. No fever, no leukocytosis, CXR no signs of acute infection or pulm edema.   Pt was treated in ED with IV mag, IV solumedrol and DuoNeb. --Continue prednisone. --Continue scheduled Duonebs, PRN albuterol  --Resume home Symbicort (Of note, patient gets sore throat from other inhaled corticosteroids, patient's own med order placed) --Supplement O2 to keep sats >=90%, wean as tolerated --Supportive care: antitussives, pulmonary hygiene --Given 40 mg IV Lasix  once on 10/20 due to increased dyspnea with chest tightness and chest x-ray showing pulmonary vascular congestion --Monitor volume status: Daily weights  --No further diuresis for now given worsened renal function  AKI -not present on admission.  Creatinine increased from 1.11>>1.45>>1.61 today. Slightly worsened today after Lasix given yesterday, although this improves her respiratory symptoms. -- Hold Lasix and lisinopril, hold other nephrotoxins and avoid hypotension -- Monitor BMP  Hyponatremia -not present on admission.   10/20: Sodium declined from 139 down to 130.   10/21: Sodium improved to 134 after diuresis yesterday Normal serum osmolality.  Volume status difficult to assess.   Initially started on IV hydration but had worsening dyspnea with this, points to hypervolemia. --Follow BMP  OSA on CPAP - Patient reports her machine is broken and she has had trouble getting another  --CPAP nightly --TOC to check with [facility] to replace home machine  Chronic diastolic CHF - pt appears euvolemic, and BNP normal at 23 and chest x-ray on admission was not consistent with CHF.    Echo in April 2022 showed EF 55 to 60%.  Grade 1 diastolic dysfunction. -- Lasix on hold due to AKI --Daily weights --Monitor renal function and electrolytes   Essential Hypertension - BP elevated on admission -- Hold lisinopril due to AKI (dose was increased dose of 20 mg daily) -- Hold home Lasix due to AKI -- Started on amlodipine while her home meds are held --As needed hydralazine  Restless leg syndrome -continue home Requip  Allergic rhinitis -continue loratadine (takes  this using at home Zyrtec at home)  Morbid obesity: Body mass index is 56.76 kg/m.  Complicates overall care and prognosis.  Recommend lifestyle modifications including physical activity and diet for weight loss and overall long-term health. Body habitus affects respiratory mechanics, contributing to OSA, may also have  OHS.    DVT prophylaxis: Lovenox   Diet:  Diet Orders (From admission, onward)     Start     Ordered   03/06/21 0314  Diet Heart Room service appropriate? Yes; Fluid consistency: Thin  Diet effective now       Question Answer Comment  Room service appropriate? Yes   Fluid consistency: Thin      03/06/21 0314              Code Status: Full Code   Subjective 03/09/21    Pt sitting up in recliner when seen today.  She reports her significant shortness of breath yesterday afternoon improved after Lasix was given.  Reports excellent urine output with that.  Chest tightness is feeling better.  Still coughing but improved, phlegm is thick at times.  Denies fevers or chills.  No other acute complaints.   Disposition Plan & Communication   Status is: Inpatient  Remains inpatient appropriate because: Severity of illness with acute kidney injury, persistent dyspnea with exertion.    Family Communication: None at bedside   Consults, Procedures, Significant Events   Consultants:  None  Procedures:  None  Antimicrobials:  Anti-infectives (From admission, onward)    None         Micro    Objective   Vitals:   03/09/21 0544 03/09/21 0736 03/09/21 0922 03/09/21 1153  BP: (!) 148/63  (!) 143/57 (!) 134/56  Pulse: 86  100 95  Resp: 20  16 20   Temp: 98.6 F (37 C)  98 F (36.7 C) 98.6 F (37 C)  TempSrc: Oral  Oral   SpO2: 94% 91% 94% 92%  Weight: (!) 150 kg     Height:        Intake/Output Summary (Last 24 hours) at 03/09/2021 1331 Last data filed at 03/09/2021 1018 Gross per 24 hour  Intake 502.5 ml  Output 1 ml  Net 501.5 ml   Filed Weights   03/05/21 1518 03/08/21 0300 03/09/21 0544  Weight: (!) 149.2 kg (!) 149 kg (!) 150 kg    Physical Exam:  General exam: awake, alert, sitting in recliner, no acute distress, morbidly obese Respiratory system: Lungs are clear but generally diminished, no expiratory wheezes heard no rhonchi, normal  respiratory effort at rest, no conversational dyspnea, on room air. Cardiovascular system: normal S1/S2, RRR, nonpitting lower extremity edema appears stable.   Central nervous system: A&O x3. no gross focal neurologic deficits, normal speech Skin: Dry, intact, no rashes seen on visualized skin Psychiatry: normal mood, congruent affect, judgement and insight appear normal  Labs   Data Reviewed: I have personally reviewed following labs and imaging studies  CBC: Recent Labs  Lab 03/05/21 1525  WBC 9.4  HGB 10.6*  HCT 34.2*  MCV 79.9*  PLT 270   Basic Metabolic Panel: Recent Labs  Lab 03/05/21 1525 03/05/21 2139 03/08/21 0545 03/09/21 0251  NA 139  --  130* 134*  K 3.9  --  4.0 4.2  CL 100  --  94* 93*  CO2 29  --  29 33*  GLUCOSE 97  --  97 111*  BUN 15  --  30* 30*  CREATININE 1.11*  --  1.45* 1.61*  CALCIUM 9.5  --  8.9 8.9  MG  --  1.9 1.9  --    GFR: Estimated Creatinine Clearance: 49 mL/min (A) (by C-G formula based on SCr of 1.61 mg/dL (H)). Liver Function Tests: Recent Labs  Lab 03/05/21 1525  AST 16  ALT 10  ALKPHOS 71  BILITOT 0.6  PROT 7.1  ALBUMIN 3.2*   No results for input(s): LIPASE, AMYLASE in the last 168 hours. No results for input(s): AMMONIA in the last 168 hours. Coagulation Profile: No results for input(s): INR, PROTIME in the last 168 hours. Cardiac Enzymes: No results for input(s): CKTOTAL, CKMB, CKMBINDEX, TROPONINI in the last 168 hours. BNP (last 3 results) No results for input(s): PROBNP in the last 8760 hours. HbA1C: No results for input(s): HGBA1C in the last 72 hours. CBG: No results for input(s): GLUCAP in the last 168 hours. Lipid Profile: No results for input(s): CHOL, HDL, LDLCALC, TRIG, CHOLHDL, LDLDIRECT in the last 72 hours. Thyroid Function Tests: No results for input(s): TSH, T4TOTAL, FREET4, T3FREE, THYROIDAB in the last 72 hours. Anemia Panel: No results for input(s): VITAMINB12, FOLATE, FERRITIN, TIBC, IRON,  RETICCTPCT in the last 72 hours. Sepsis Labs: No results for input(s): PROCALCITON, LATICACIDVEN in the last 168 hours.  Recent Results (from the past 240 hour(s))  Resp Panel by RT-PCR (Flu A&B, Covid) Nasopharyngeal Swab     Status: None   Collection Time: 03/06/21 12:14 AM   Specimen: Nasopharyngeal Swab; Nasopharyngeal(NP) swabs in vial transport medium  Result Value Ref Range Status   SARS Coronavirus 2 by RT PCR NEGATIVE NEGATIVE Final    Comment: (NOTE) SARS-CoV-2 target nucleic acids are NOT DETECTED.  The SARS-CoV-2 RNA is generally detectable in upper respiratory specimens during the acute phase of infection. The lowest concentration of SARS-CoV-2 viral copies this assay can detect is 138 copies/mL. A negative result does not preclude SARS-Cov-2 infection and should not be used as the sole basis for treatment or other patient management decisions. A negative result may occur with  improper specimen collection/handling, submission of specimen other than nasopharyngeal swab, presence of viral mutation(s) within the areas targeted by this assay, and inadequate number of viral copies(<138 copies/mL). A negative result must be combined with clinical observations, patient history, and epidemiological information. The expected result is Negative.  Fact Sheet for Patients:  EntrepreneurPulse.com.au  Fact Sheet for Healthcare Providers:  IncredibleEmployment.be  This test is no t yet approved or cleared by the [place] FDA and  has been authorized for detection and/or diagnosis of SARS-CoV-2 by FDA under an Emergency Use Authorization (EUA). This EUA will remain  in effect (meaning this test can be used) for the duration of the COVID-19 declaration under Section 564(b)(1) of the Act, 21 U.S.C.section 360bbb-3(b)(1), unless the authorization is terminated  or revoked sooner.       Influenza A by PCR NEGATIVE NEGATIVE Final   Influenza  B by PCR NEGATIVE NEGATIVE Final    Comment: (NOTE) The Xpert Xpress SARS-CoV-2/FLU/RSV plus assay is intended as an aid in the diagnosis of influenza from Nasopharyngeal swab specimens and should not be used as a sole basis for treatment. Nasal washings and aspirates are unacceptable for Xpert Xpress SARS-CoV-2/FLU/RSV testing.  Fact Sheet for Patients: EntrepreneurPulse.com.au  Fact Sheet for Healthcare Providers: IncredibleEmployment.be  This test is not yet approved or cleared by the [place] FDA and has been authorized for detection and/or diagnosis of SARS-CoV-2 by FDA under an Emergency Use Authorization (EUA).  This EUA will remain in effect (meaning this test can be used) for the duration of the COVID-19 declaration under Section 564(b)(1) of the Act, 21 U.S.C. section 360bbb-3(b)(1), unless the authorization is terminated or revoked.  Performed at [facility], [address] [place], [place] [address]       Imaging Studies   DG Chest [place] 1 View  Result Date: 03/08/2021 CLINICAL DATA:  Dyspnea. EXAM: PORTABLE CHEST 1 VIEW COMPARISON:  March 05, 2021. FINDINGS: Mild cardiomegaly is noted with central pulmonary vascular congestion. No pneumothorax is noted. Mild bibasilar subsegmental atelectasis or possibly edema is noted. Bony thorax is unremarkable. No pneumothorax is noted. IMPRESSION: Mild cardiomegaly with central pulmonary vascular congestion. Mild bibasilar atelectasis or possibly edema is noted. Electronically Signed   By: Marijo Conception M.D.   On: 03/08/2021 16:10     Medications   Scheduled Meds:  amLODipine  5 mg Oral Daily   budesonide-formoterol  2 puff Inhalation BID   chlorhexidine  15 mL Mouth Rinse BID   cholestyramine  4 g Oral QHS   dextromethorphan-guaiFENesin  1 tablet Oral Daily   enoxaparin (LOVENOX) injection  0.5 mg/kg Subcutaneous Q24H   ipratropium-albuterol  3 mL Nebulization TID    loratadine  10 mg Oral Daily   mouth rinse  15 mL Mouth Rinse q12n4p   pantoprazole  40 mg Oral Daily   predniSONE  40 mg Oral Q breakfast   rOPINIRole  1 mg Oral TID   Continuous Infusions:     LOS: 3 days    Time spent: 25 minutes with > 50% spent at bedside and in coordination of care     Ezekiel Slocumb, DO [facility]  03/09/2021, 1:31 PM      If 7PM-7AM, please contact night-coverage. How to contact the [facility] Attending or Consulting provider [place] or covering provider during after hours [place], for this patient?    Check the care team in [facility] and look for a) attending/consulting TRH provider listed and b) the [facility] team listed Log into [email or web address] and use [place]'s universal password to access. If you do not have the password, please contact the hospital operator. Locate the [facility] provider you are looking for under [facility] and page to a number that you can be directly reached. If you still have difficulty reaching the provider, please page the [facility] (Director on Call) for the Hospitalists listed on amion for assistance.

## 2021-03-09 NOTE — Care Management Important Message (Signed)
Important Message  Patient Details  Name: Samantha Mason MRN: [id number] Date of Birth: [birth year]   Medicare Important Message Given:  Yes     Juliann Pulse A Shanedra Lave 03/09/2021, 12:01 PM

## 2021-03-09 NOTE — Progress Notes (Signed)
This is a patient of Dr. Zoila Shutter.  Her dyspnea is out of proportion to her PFT findings.  Had an equivocal VQ scan with negative CT angio recently.  May benefit from right heart cath to evaluate for possible pulmonary hypertension/pulmonary venoocclusive disease.  Renold Don, MD Advanced Bronchoscopy PCCM [facility]    *This note was dictated using voice recognition software/Dragon.  Despite best efforts to proofread, errors can occur which can change the meaning.  Any change was purely unintentional.

## 2021-03-10 LAB — CBC
HCT: 32.4 % — ABNORMAL LOW (ref 36.0–46.0)
Hemoglobin: 10.3 g/dL — ABNORMAL LOW (ref 12.0–15.0)
MCH: 24.9 pg — ABNORMAL LOW (ref 26.0–34.0)
MCHC: 31.8 g/dL (ref 30.0–36.0)
MCV: 78.3 fL — ABNORMAL LOW (ref 80.0–100.0)
Platelets: 323 10*3/uL (ref 150–400)
RBC: 4.14 MIL/uL (ref 3.87–5.11)
RDW: 15.6 % — ABNORMAL HIGH (ref 11.5–15.5)
WBC: 10 10*3/uL (ref 4.0–10.5)
nRBC: 0 % (ref 0.0–0.2)

## 2021-03-10 LAB — BASIC METABOLIC PANEL
Anion gap: 6 (ref 5–15)
BUN: 31 mg/dL — ABNORMAL HIGH (ref 8–23)
CO2: 34 mmol/L — ABNORMAL HIGH (ref 22–32)
Calcium: 8.7 mg/dL — ABNORMAL LOW (ref 8.9–10.3)
Chloride: 94 mmol/L — ABNORMAL LOW (ref 98–111)
Creatinine, Ser: 1.36 mg/dL — ABNORMAL HIGH (ref 0.44–1.00)
GFR, Estimated: 42 mL/min — ABNORMAL LOW (ref >60)
Glucose, Bld: 103 mg/dL — ABNORMAL HIGH (ref 70–99)
Potassium: 4.2 mmol/L (ref 3.5–5.1)
Sodium: 134 mmol/L — ABNORMAL LOW (ref 135–145)

## 2021-03-10 LAB — VITAMIN D 25 HYDROXY (VIT D DEFICIENCY, FRACTURES): Vit D, 25-Hydroxy: 24.39 ng/mL — ABNORMAL LOW (ref 30–100)

## 2021-03-10 MED ORDER — LISINOPRIL 10 MG PO TABS: 10.0000 mg | ORAL_TABLET | Freq: Every day | ORAL | Status: DC

## 2021-03-10 MED ORDER — IPRATROPIUM-ALBUTEROL 20-100 MCG/ACT IN AERS: 1.0000 | INHALATION_SPRAY | Freq: Four times a day (QID) | RESPIRATORY_TRACT | 1 refills | Status: DC | PRN

## 2021-03-10 MED ORDER — GUAIFENESIN-DM 100-10 MG/5ML PO SYRP: 10.0000 mL | ORAL_SOLUTION | Freq: Four times a day (QID) | ORAL | 0 refills | Status: DC | PRN

## 2021-03-10 MED ORDER — PREDNISONE 10 MG PO TABS: ORAL_TABLET | ORAL | 0 refills | Status: AC

## 2021-03-10 MED ORDER — IPRATROPIUM-ALBUTEROL 20-100 MCG/ACT IN AERS
1.0000 | INHALATION_SPRAY | Freq: Four times a day (QID) | RESPIRATORY_TRACT | Status: DC | PRN
  Filled 2021-03-10: qty 4

## 2021-03-10 MED ORDER — FUROSEMIDE 20 MG PO TABS: 20.0000 mg | ORAL_TABLET | Freq: Every day | ORAL | 3 refills | Status: DC | PRN

## 2021-03-10 MED ORDER — IPRATROPIUM-ALBUTEROL 0.5-2.5 (3) MG/3ML IN SOLN: 3.0000 mL | Freq: Four times a day (QID) | RESPIRATORY_TRACT | Status: DC | PRN

## 2021-03-10 MED ORDER — BUDESONIDE-FORMOTEROL FUMARATE 160-4.5 MCG/ACT IN AERO: 2.0000 | INHALATION_SPRAY | Freq: Two times a day (BID) | RESPIRATORY_TRACT | 12 refills | Status: DC

## 2021-03-10 NOTE — Progress Notes (Signed)
Pt's son present for discharge; pt discharged via wheelchair by volunteer to the [place] entrance

## 2021-03-10 NOTE — Discharge Summary (Signed)
Physician Discharge Summary  Samantha Mason QPY:[id number] DOB: 05/06/1951 DOA: 03/05/2021  PCP: Glean Hess, MD  Admit date: 03/05/2021 Discharge date: 03/10/2021  Admitted From: home Disposition:  home  Recommendations for Outpatient Follow-up:  Follow up with PCP in 1-2 weeks Please obtain BMP/CBC in one week Please follow up with Pulmonology Follow up on patient's BP control, it was mildly elevated during admission. Follow up on new CPAP machine, patient's current one not working. [facility] made aware.   Home Health: No  Equipment/Devices: None   Discharge Condition: Stable  CODE STATUS: Full  Diet recommendation: Heart Healthy     Discharge Diagnoses: Principal Problem:   Asthma exacerbation Active Problems:   GERD (gastroesophageal reflux disease)   Essential hypertension   IBS (irritable bowel syndrome)   Allergic rhinitis   Restless leg syndrome   Severe persistent asthma   OSA on CPAP   Chronic diastolic CHF (congestive heart failure) ([place])   Morbid obesity with BMI of 50.0-59.9, adult ([place])    Summary of HPI and Hospital Course:   "Samantha Mason is a 70 y.o. female with medical history significant for asthma, OSA on CPAP, HTN, chronic diastolic heart failure, sent in by her pulmonologist because of continued breathing difficulty and O2 sats of 88% while at rest.  Patient was recently evaluated by her cardiologist and had a negative cardiac stress test.  Also states she had a negative PET scan.  States her CPAP machine is not working.  She went to her pulmonologist office today and they sent her in to the ED for further evaluation patient reports chest tightness associated with wheezing dyspnea on exertion and lightheadedness."    Acute hypoxemic respiratory failure due to Acute exacerbation of severe persistent asthma Patient noted with spO2 88% on room air in pulmonology clinic. Placed on 2L and sent to the ED. No prior hx of COPD and  patient is a never smoker. Follows with pulmonology for her asthma and OSA. No fever, no leukocytosis, CXR no signs of acute infection or pulm edema.   Pt was treated in ED with IV mag, IV solumedrol and DuoNeb. --Continue prednisone taper 3 more days at d/c --Treated with scheduled Duonebs, PRN albuterol  --Resumed on home Symbicort (Of note, patient gets sore throat from other inhaled corticosteroids, patient's own med order placed) --Supplement O2 to keep sats >=90%, wean as tolerated --Supportive care: antitussives, pulmonary hygiene --Given 40 mg IV Lasix once on 10/20 due to increased dyspnea with chest tightness and chest x-ray showing pulmonary vascular congestion --Monitor volume status: Daily weights   Continue using home oxygen as needed if sats dropping below 90%. Follow up with pulmonology. [facility] is looking into getting you a new CPAP machine.  Our case manager has reached out to them   AKI -not present on admission.   Creatinine increased from 1.11>>1.45>>1.61... Slightly worsened today after Lasix given, although this improved her respiratory symptoms. Renal function improved.   Lasix and lisinopril resumed of d/c.   Close PCP follow up advised.   Hyponatremia -not present on admission.   Likely hypervolemic with diuretic held due to AKI Hospital course was prolonged due to this. 10/20: Sodium declined from 139 down to 130.   10/21: Sodium improved to 134 after diuresis yesterday Normal serum osmolality.   Volume status difficult to assess.  --BMP in follow up   OSA on CPAP - Patient reports her machine is broken and she has had trouble getting  another  --CPAP nightly --TOC contacted [facility] re replacement of home machine which is not functioning   Chronic diastolic CHF - pt appears euvolemic, and BNP normal at 23 and chest x-ray on admission was not consistent with CHF.    Echo in April 2022 showed EF 55 to 60%.  Grade 1 diastolic dysfunction. --  Lasix held due to AKI, resume at d/c --Daily weights to monitor volume status --Monitor renal function and electrolytes in follow up   Essential Hypertension - BP elevated on admission -- Held lisinopril due to AKI on admission, resumed one renal function improved.  -- Held home Lasix due to AKI on admission, resume on d/c -- Started on amlodipine while home meds were held --As needed hydralazine   Restless leg syndrome -continue home Requip   Allergic rhinitis - continue antihistamines   Morbid obesity: Body mass index is 56.76 kg/m.  Complicates overall care and prognosis.  Recommend lifestyle modifications including physical activity and diet for weight loss and overall long-term health. Body habitus affects respiratory mechanics, contributing to OSA, may also have OHS.       Discharge Instructions   Discharge Instructions     (HEART FAILURE PATIENTS) Call MD:  Anytime you have any of the following symptoms: 1) 3 pound weight gain in 24 hours or 5 pounds in 1 week 2) shortness of breath, with or without a dry hacking cough 3) swelling in the hands, feet or stomach 4) if you have to sleep on extra pillows at night in order to breathe.   Complete by: As directed    Call MD for:   Complete by: As directed    Worsening wheezing or shortness of breath.   Call MD for:  extreme fatigue   Complete by: As directed    Call MD for:  persistant dizziness or light-headedness   Complete by: As directed    Call MD for:  persistant nausea and vomiting   Complete by: As directed    Call MD for:  severe uncontrolled pain   Complete by: As directed    Call MD for:  temperature >100.4   Complete by: As directed    Diet - low sodium heart healthy   Complete by: As directed    Discharge instructions   Complete by: As directed    For ASTHMA --- I sent 3 more days of prednisone to taper off.   Take 30 mg, then 20 mg, then 10 mg -- one day each then stop. If you feel more wheezing or  worsening shortness of breath after you've finished the prednisone, please call your PCP or Pulmonologist's office.  LASIX -- continue taking this as needed for increased swelling or weight gain, as you were before.  BLOOD PRESSURE -- please monitor you BP at home and call your PCP if your BP is consistently staying over 140/90.  Continue taking your regular 10 mg dose of lisinopril for now.  Your BP was running higher in the hospital, but expect it will normalize when you're back home in your normal environment.   Increase activity slowly   Complete by: As directed       Allergies as of 03/10/2021       Reactions   Nuvigil [armodafinil] Hives   Penicillins Diarrhea, Nausea And Vomiting   Did it involve swelling of the face/tongue/throat, SOB, or low BP? no Did it involve sudden or severe rash/hives, skin peeling, or any reaction on the inside of your mouth  or nose? No Did you need to seek medical attention at a hospital or doctor's office? No When did it last happen?  in her 5s    If all above answers are "NO", may proceed with cephalosporin use.   Gabapentin Other (See Comments)   "wiped her out, couldn't stay awake"   Provigil [modafinil] Hives        Medication List     STOP taking these medications    azithromycin 250 MG tablet Commonly known as: ZITHROMAX   Oscal 500/200 D-3 500-5 MG-MCG Tabs Generic drug: Calcium Carbonate-Vitamin D       TAKE these medications    acetaminophen 500 MG tablet Commonly known as: TYLENOL Take 1,000 mg by mouth every 6 (six) hours as needed.   albuterol 1.25 MG/3ML nebulizer solution Commonly known as: ACCUNEB Take 3 mLs (1.25 mg total) by nebulization every 6 (six) hours as needed for wheezing.   benzonatate 100 MG capsule Commonly known as: TESSALON Take 100 mg by mouth 3 (three) times daily as needed for cough.   budesonide-formoterol 160-4.5 MCG/ACT inhaler Commonly known as: Symbicort Inhale 2 puffs into the lungs 2  (two) times daily.   cetirizine 10 MG tablet Commonly known as: ZYRTEC Take 1 tablet (10 mg total) by mouth daily.   cholestyramine 4 g packet Commonly known as: QUESTRAN Take 1 packet (4 g total) and drink by mouth at bedtime.   furosemide 20 MG tablet Commonly known as: LASIX Take 1 tablet (20 mg total) by mouth daily as needed (swelling or wt gain). What changed:  when to take this reasons to take this   guaiFENesin-dextromethorphan 100-10 MG/5ML syrup Commonly known as: ROBITUSSIN DM Take 10 mLs by mouth every 6 (six) hours as needed for cough.   ipratropium-albuterol 0.5-2.5 (3) MG/3ML Soln Commonly known as: DUONEB Take 3 mLs by nebulization every 4 (four) hours as needed. What changed: Another medication with the same name was added. Make sure you understand how and when to take each.   Ipratropium-Albuterol 20-100 MCG/ACT Aers respimat Commonly known as: COMBIVENT Inhale 1 puff into the lungs every 6 (six) hours as needed for wheezing or shortness of breath. What changed: You were already taking a medication with the same name, and this prescription was added. Make sure you understand how and when to take each.   lisinopril 10 MG tablet Commonly known as: ZESTRIL TAKE 1 TABLET BY MOUTH DAILY.   multivitamin-iron-minerals-folic acid chewable tablet Chew 1 tablet by mouth daily.   pantoprazole 40 MG tablet Commonly known as: PROTONIX TAKE 1 TABLET BY MOUTH DAILY   predniSONE 10 MG tablet Commonly known as: DELTASONE Take 3 tablets (30 mg total) by mouth daily with breakfast for 1 day, THEN 2 tablets (20 mg total) daily with breakfast for 1 day, THEN 1 tablet (10 mg total) daily with breakfast for 1 day. Start taking on: March 10, 2021   promethazine 25 MG tablet Commonly known as: PHENERGAN TAKE 1 TABLET BY MOUTH EVERY 6 HOURS AS NEEDED FOR NAUSEA OR VOMITING.   rOPINIRole 1 MG tablet Commonly known as: REQUIP Take 1 tablet (1 mg total) by mouth 3 (three)  times daily as needed.        Allergies  Allergen Reactions   Nuvigil [Armodafinil] Hives   Penicillins Diarrhea and Nausea And Vomiting    Did it involve swelling of the face/tongue/throat, SOB, or low BP? no Did it involve sudden or severe rash/hives, skin peeling, or any reaction on  the inside of your mouth or nose? No Did you need to seek medical attention at a hospital or doctor's office? No When did it last happen?  in her 60s    If all above answers are "NO", may proceed with cephalosporin use.    Gabapentin Other (See Comments)    "wiped her out, couldn't stay awake"   Provigil [Modafinil] Hives     If you experience worsening of your admission symptoms, develop shortness of breath, life threatening emergency, suicidal or homicidal thoughts you must seek medical attention immediately by calling 911 or calling your MD immediately  if symptoms less severe.    Please note   You were cared for by a hospitalist during your hospital stay. If you have any questions about your discharge medications or the care you received while you were in the hospital after you are discharged, you can call the unit and asked to speak with the hospitalist on call if the hospitalist that took care of you is not available. Once you are discharged, your primary care physician will handle any further medical issues. Please note that NO REFILLS for any discharge medications will be authorized once you are discharged, as it is imperative that you return to your primary care physician (or establish a relationship with a primary care physician if you do not have one) for your aftercare needs so that they can reassess your need for medications and monitor your lab values.   Consultations: none    Procedures/Studies: DG Chest 2 View  Result Date: 03/05/2021 CLINICAL DATA:  Worsening shortness of breath. EXAM: CHEST - 2 VIEW COMPARISON:  CTA chest February 13, 2021 FINDINGS: The heart size and  mediastinal contours are within normal limits. Chronic bronchitic lung changes. No focal airspace consolidation. Unchanged elevation/eventration of the right hemidiaphragm. No pleural effusion. No pneumothorax. The visualized skeletal structures are unremarkable. IMPRESSION: Chronic bronchitic lung changes.  No focal consolidation. Electronically Signed   By: Dahlia Bailiff M.D.   On: 03/05/2021 16:28   DG Chest 2 View  Result Date: 02/13/2021 CLINICAL DATA:  Shortness of breath EXAM: CHEST - 2 VIEW COMPARISON:  09/19/2020 FINDINGS: Mild peribronchial thickening. Heart and mediastinal contours are within normal limits. No focal opacities or effusions. No acute bony abnormality. IMPRESSION: Mild bronchitic changes. Electronically Signed   By: Rolm Baptise M.D.   On: 02/13/2021 08:32   CT Angio Chest PE W and/or Wo Contrast  Result Date: 02/13/2021 CLINICAL DATA:  Possible abnormal pulmonary perfusion test cough short of breath EXAM: CT ANGIOGRAPHY CHEST WITH CONTRAST TECHNIQUE: Multidetector CT imaging of the chest was performed using the standard protocol during bolus administration of intravenous contrast. Multiplanar CT image reconstructions and MIPs were obtained to evaluate the vascular anatomy. CONTRAST:  85mL OMNIPAQUE IOHEXOL 350 MG/ML SOLN COMPARISON:  Perfusion study 02/12/2021, chest x-ray 02/12/2021 CT chest 08/29/2020 FINDINGS: Cardiovascular: Satisfactory opacification of the pulmonary arteries to the segmental level. No evidence of pulmonary embolism. Nonaneurysmal aorta. No dissection seen. Borderline cardiomegaly. No pericardial effusion Mediastinum/Nodes: No enlarged mediastinal, hilar, or axillary lymph nodes. Thyroid gland, trachea, and esophagus demonstrate no significant findings. Lungs/Pleura: No consolidation, pleural effusion or pneumothorax. Mild mosaic attenuation likely due to small airways disease. Upper Abdomen: No acute abnormality. Musculoskeletal: Degenerative changes. No  acute osseous abnormality. Review of the MIP images confirms the above findings. IMPRESSION: 1. Negative for acute pulmonary embolus. 2. Negative for consolidative pneumonia. Mild mosaicism, suspect that this is secondary to small airways disease Electronically Signed  By: Donavan Foil M.D.   On: 02/13/2021 15:40   NM Pulmonary Perfusion  Result Date: 02/13/2021 CLINICAL DATA:  Shortness of breath. EXAM: NUCLEAR MEDICINE PERFUSION LUNG SCAN TECHNIQUE: Perfusion images were obtained in multiple projections after intravenous injection of radiopharmaceutical. Ventilation scans intentionally deferred if perfusion scan and chest x-ray adequate for interpretation during COVID 19 epidemic. RADIOPHARMACEUTICALS:  3.79 mCi Tc-22m MAA IV COMPARISON:  Chest x-ray 02/12/2021. CT chest 05/07/2019. Prior nuclear medicine lung scan report 08/08/1997. FINDINGS: Perfusion defects in the posterior left mid lung can not be excluded. Pulmonary embolus can not be excluded. Elevation right hemidiaphragm again noted. IMPRESSION: Perfusion defects in the posterior left mid lung can not be excluded. Pulmonary embolus can not be excluded. These results will be called to the ordering clinician or representative by the Radiologist Assistant, and communication documented in the PACS or [employer]. Electronically Signed   By: Marcello Moores  Register M.D.   On: 02/13/2021 11:15   DG Chest Port 1 View  Result Date: 03/08/2021 CLINICAL DATA:  Dyspnea. EXAM: PORTABLE CHEST 1 VIEW COMPARISON:  March 05, 2021. FINDINGS: Mild cardiomegaly is noted with central pulmonary vascular congestion. No pneumothorax is noted. Mild bibasilar subsegmental atelectasis or possibly edema is noted. Bony thorax is unremarkable. No pneumothorax is noted. IMPRESSION: Mild cardiomegaly with central pulmonary vascular congestion. Mild bibasilar atelectasis or possibly edema is noted. Electronically Signed   By: Marijo Conception M.D.   On: 03/08/2021 16:10        Subjective: Pt feeling better.  She had an episode of shortness of breath early this AM that improved with nebulizer treatment.     Discharge Exam: Vitals:   03/10/21 0433 03/10/21 0729  BP: (!) 154/77 (!) 142/69  Pulse: 80 81  Resp: 16 18  Temp: 98.1 F (36.7 C) 98.2 F (36.8 C)  SpO2: 94% 94%   Vitals:   03/09/21 1935 03/10/21 0433 03/10/21 0435 03/10/21 0729  BP: (!) 147/76 (!) 154/77  (!) 142/69  Pulse: 98 80  81  Resp: 18 16  18   Temp: 98.4 F (36.9 C) 98.1 F (36.7 C)  98.2 F (36.8 C)  TempSrc: Oral Oral  Oral  SpO2: 93% 94%  94%  Weight:   (!) 148.5 kg   Height:        General: Pt is alert, awake, not in acute distress, obese Cardiovascular: RRR, S1/S2 +, no rubs, no gallops Respiratory: CTA bilaterally with improved aeration at the bases, no wheezing, no rhonchi, normal respiratory effort, on room air Abdominal: Soft, NT, ND, bowel sounds + Extremities: stable nonpitting lower extremity edema, no cyanosis    The results of significant diagnostics from this hospitalization (including imaging, microbiology, ancillary and laboratory) are listed below for reference.     Microbiology: Recent Results (from the past 240 hour(s))  Resp Panel by RT-PCR (Flu A&B, Covid) Nasopharyngeal Swab     Status: None   Collection Time: 03/06/21 12:14 AM   Specimen: Nasopharyngeal Swab; Nasopharyngeal(NP) swabs in vial transport medium  Result Value Ref Range Status   SARS Coronavirus 2 by RT PCR NEGATIVE NEGATIVE Final    Comment: (NOTE) SARS-CoV-2 target nucleic acids are NOT DETECTED.  The SARS-CoV-2 RNA is generally detectable in upper respiratory specimens during the acute phase of infection. The lowest concentration of SARS-CoV-2 viral copies this assay can detect is 138 copies/mL. A negative result does not preclude SARS-Cov-2 infection and should not be used as the sole basis for treatment or other patient  management decisions. A negative result may occur  with  improper specimen collection/handling, submission of specimen other than nasopharyngeal swab, presence of viral mutation(s) within the areas targeted by this assay, and inadequate number of viral copies(<138 copies/mL). A negative result must be combined with clinical observations, patient history, and epidemiological information. The expected result is Negative.  Fact Sheet for Patients:  EntrepreneurPulse.com.au  Fact Sheet for Healthcare Providers:  IncredibleEmployment.be  This test is no t yet approved or cleared by the [place] FDA and  has been authorized for detection and/or diagnosis of SARS-CoV-2 by FDA under an Emergency Use Authorization (EUA). This EUA will remain  in effect (meaning this test can be used) for the duration of the COVID-19 declaration under Section 564(b)(1) of the Act, 21 U.S.C.section 360bbb-3(b)(1), unless the authorization is terminated  or revoked sooner.       Influenza A by PCR NEGATIVE NEGATIVE Final   Influenza B by PCR NEGATIVE NEGATIVE Final    Comment: (NOTE) The Xpert Xpress SARS-CoV-2/FLU/RSV plus assay is intended as an aid in the diagnosis of influenza from Nasopharyngeal swab specimens and should not be used as a sole basis for treatment. Nasal washings and aspirates are unacceptable for Xpert Xpress SARS-CoV-2/FLU/RSV testing.  Fact Sheet for Patients: EntrepreneurPulse.com.au  Fact Sheet for Healthcare Providers: IncredibleEmployment.be  This test is not yet approved or cleared by the [place] FDA and has been authorized for detection and/or diagnosis of SARS-CoV-2 by FDA under an Emergency Use Authorization (EUA). This EUA will remain in effect (meaning this test can be used) for the duration of the COVID-19 declaration under Section 564(b)(1) of the Act, 21 U.S.C. section 360bbb-3(b)(1), unless the authorization is terminated  or revoked.  Performed at [facility], [address] [place], [place] [address]      Labs: BNP (last 3 results) Recent Labs    02/07/21 1720 03/05/21 1525 03/08/21 1413  BNP 15.5 23.9 24.2   Basic Metabolic Panel: Recent Labs  Lab 03/05/21 1525 03/05/21 2139 03/08/21 0545 03/09/21 0251 03/10/21 0324  NA 139  --  130* 134* 134*  K 3.9  --  4.0 4.2 4.2  CL 100  --  94* 93* 94*  CO2 29  --  29 33* 34*  GLUCOSE 97  --  97 111* 103*  BUN 15  --  30* 30* 31*  CREATININE 1.11*  --  1.45* 1.61* 1.36*  CALCIUM 9.5  --  8.9 8.9 8.7*  MG  --  1.9 1.9  --   --    Liver Function Tests: Recent Labs  Lab 03/05/21 1525  AST 16  ALT 10  ALKPHOS 71  BILITOT 0.6  PROT 7.1  ALBUMIN 3.2*   No results for input(s): LIPASE, AMYLASE in the last 168 hours. No results for input(s): AMMONIA in the last 168 hours. CBC: Recent Labs  Lab 03/05/21 1525 03/10/21 0324  WBC 9.4 10.0  HGB 10.6* 10.3*  HCT 34.2* 32.4*  MCV 79.9* 78.3*  PLT 297 323   Cardiac Enzymes: No results for input(s): CKTOTAL, CKMB, CKMBINDEX, TROPONINI in the last 168 hours. BNP: Invalid input(s): POCBNP CBG: No results for input(s): GLUCAP in the last 168 hours. D-Dimer Recent Labs    03/08/21 1413  DDIMER 0.71*   Hgb A1c No results for input(s): HGBA1C in the last 72 hours. Lipid Profile No results for input(s): CHOL, HDL, LDLCALC, TRIG, CHOLHDL, LDLDIRECT in the last 72 hours. Thyroid function studies No results for input(s):  TSH, T4TOTAL, T3FREE, THYROIDAB in the last 72 hours.  Invalid input(s): FREET3 Anemia work up No results for input(s): VITAMINB12, FOLATE, FERRITIN, TIBC, IRON, RETICCTPCT in the last 72 hours. Urinalysis    Component Value Date/Time   COLORURINE STRAW (A) 04/19/2019 1314   APPEARANCEUR CLEAR (A) 04/19/2019 1314   APPEARANCEUR Clear 04/27/2012 0958   LABSPEC 1.008 04/19/2019 1314   LABSPEC 1.014 04/27/2012 0958   PHURINE 5.0 04/19/2019 1314   GLUCOSEU  NEGATIVE 04/19/2019 1314   GLUCOSEU Negative 04/27/2012 0958   HGBUR SMALL (A) 04/19/2019 1314   BILIRUBINUR neg 02/19/2021 1040   BILIRUBINUR Negative 04/27/2012 0958   KETONESUR NEGATIVE 04/19/2019 1314   PROTEINUR Negative 02/19/2021 1040   PROTEINUR NEGATIVE 04/19/2019 1314   UROBILINOGEN 0.2 02/19/2021 1040   NITRITE neg 02/19/2021 1040   NITRITE NEGATIVE 04/19/2019 1314   LEUKOCYTESUR Negative 02/19/2021 1040   LEUKOCYTESUR NEGATIVE 04/19/2019 1314   LEUKOCYTESUR Negative 04/27/2012 0958   Sepsis Labs Invalid input(s): PROCALCITONIN,  WBC,  LACTICIDVEN Microbiology Recent Results (from the past 240 hour(s))  Resp Panel by RT-PCR (Flu A&B, Covid) Nasopharyngeal Swab     Status: None   Collection Time: 03/06/21 12:14 AM   Specimen: Nasopharyngeal Swab; Nasopharyngeal(NP) swabs in vial transport medium  Result Value Ref Range Status   SARS Coronavirus 2 by RT PCR NEGATIVE NEGATIVE Final    Comment: (NOTE) SARS-CoV-2 target nucleic acids are NOT DETECTED.  The SARS-CoV-2 RNA is generally detectable in upper respiratory specimens during the acute phase of infection. The lowest concentration of SARS-CoV-2 viral copies this assay can detect is 138 copies/mL. A negative result does not preclude SARS-Cov-2 infection and should not be used as the sole basis for treatment or other patient management decisions. A negative result may occur with  improper specimen collection/handling, submission of specimen other than nasopharyngeal swab, presence of viral mutation(s) within the areas targeted by this assay, and inadequate number of viral copies(<138 copies/mL). A negative result must be combined with clinical observations, patient history, and epidemiological information. The expected result is Negative.  Fact Sheet for Patients:  EntrepreneurPulse.com.au  Fact Sheet for Healthcare Providers:  IncredibleEmployment.be  This test is no t yet  approved or cleared by the [place] FDA and  has been authorized for detection and/or diagnosis of SARS-CoV-2 by FDA under an Emergency Use Authorization (EUA). This EUA will remain  in effect (meaning this test can be used) for the duration of the COVID-19 declaration under Section 564(b)(1) of the Act, 21 U.S.C.section 360bbb-3(b)(1), unless the authorization is terminated  or revoked sooner.       Influenza A by PCR NEGATIVE NEGATIVE Final   Influenza B by PCR NEGATIVE NEGATIVE Final    Comment: (NOTE) The Xpert Xpress SARS-CoV-2/FLU/RSV plus assay is intended as an aid in the diagnosis of influenza from Nasopharyngeal swab specimens and should not be used as a sole basis for treatment. Nasal washings and aspirates are unacceptable for Xpert Xpress SARS-CoV-2/FLU/RSV testing.  Fact Sheet for Patients: EntrepreneurPulse.com.au  Fact Sheet for Healthcare Providers: IncredibleEmployment.be  This test is not yet approved or cleared by the [place] FDA and has been authorized for detection and/or diagnosis of SARS-CoV-2 by FDA under an Emergency Use Authorization (EUA). This EUA will remain in effect (meaning this test can be used) for the duration of the COVID-19 declaration under Section 564(b)(1) of the Act, 21 U.S.C. section 360bbb-3(b)(1), unless the authorization is terminated or revoked.  Performed at [facility], [address] [place], [place] [address]      Time coordinating discharge: Over 30 minutes  SIGNED:   Ezekiel Slocumb, DO [facility] 03/10/2021, 9:16 AM   If 7PM-7AM, please contact night-coverage [email or web address]

## 2021-03-10 NOTE — Plan of Care (Signed)

## 2021-03-10 NOTE — Progress Notes (Signed)
MD order received in [facility] to discharge pt home today; verbally reviewed AVS with pt including [facility] [place], [place]; no questions verbalized by the pt at this time; pt's discharge pending arrival of her son for a ride home

## 2021-03-12 ENCOUNTER — Telehealth: Payer: Self-pay

## 2021-03-12 NOTE — Telephone Encounter (Signed)
Transition Care Management Unsuccessful Follow-up Telephone Call  Date of discharge and from where:  [facility] 03/10/21  Attempts:  1st Attempt  Reason for unsuccessful TCM follow-up call:  Left voice message

## 2021-03-12 NOTE — Telephone Encounter (Signed)
Pt is calling [place] [facility] back please advise [phone]

## 2021-03-13 NOTE — Telephone Encounter (Signed)
Transition Care Management Follow-up Telephone Call Date of discharge and from where: 03/10/21 [facility] How have you been since you were released from the hospital? Pt states she is feeling better Any questions or concerns? No  Items Reviewed: Did the pt receive and understand the discharge instructions provided? Yes  Medications obtained and verified? Yes  Other? Yes  Any new allergies since your discharge? No  Dietary orders reviewed? Yes Do you have support at home? Yes   Home Care and Equipment/Supplies: Were home health services ordered? no Were any new equipment or medical supplies ordered?  No - pt needs new CPAP machine; pt advised to contact [facility] to order new device   Functional Questionnaire: (I = Independent and D = Dependent) ADLs: I  Bathing/Dressing- I  Meal Prep- I  Eating- I  Maintaining continence- I  Transferring/Ambulation- I  Managing Meds- I  Follow up appointments reviewed:  PCP Hospital f/u appt confirmed? Yes  Scheduled to see Dr. Army Melia on 03/16/21 @ 3:00. [facility] f/u appt confirmed? No  pt to contact [facility] Are transportation arrangements needed? No  If their condition worsens, is the pt aware to call PCP or go to the Emergency Dept.? Yes Was the patient provided with contact information for the PCP's office or ED? Yes Was to pt encouraged to call back with questions or concerns? Yes

## 2021-03-14 ENCOUNTER — Other Ambulatory Visit: Payer: Self-pay | Admitting: Internal Medicine

## 2021-03-14 NOTE — Telephone Encounter (Signed)
Requested Prescriptions  Pending Prescriptions Disp Refills  . cholestyramine (QUESTRAN) 4 g packet [Pharmacy Med Name: CHOLESTYRAMINE PACKET] 30 packet 11    Sig: DISSOLVE AND [place] 1 PACKET BY MOUTH AT BEDTIME     Cardiovascular:  Antilipid - Bile Acid Sequestrants Passed - 03/14/2021 12:18 PM      Passed - Total Cholesterol in normal range and within 360 days    Cholesterol  Date Value Ref Range Status  02/07/2021 168 0 - 200 mg/dL Final         Passed - LDL in normal range and within 360 days    LDL Cholesterol  Date Value Ref Range Status  02/07/2021 92 0 - 99 mg/dL Final    Comment:           Total Cholesterol/HDL:CHD Risk Coronary Heart Disease Risk Table                     Men   Women  1/2 Average Risk   3.4   3.3  Average Risk       5.0   4.4  2 X Average Risk   9.6   7.1  3 X Average Risk  23.4   11.0        Use the calculated Patient Ratio above and the CHD Risk Table to determine the patient's CHD Risk.        ATP III CLASSIFICATION (LDL):  <100     mg/dL   Optimal  100-129  mg/dL   Near or Above                    Optimal  130-159  mg/dL   Borderline  160-189  mg/dL   High  >190     mg/dL   Very High Performed at [facility], [address] [place], [place] [address]          Passed - HDL in normal range and within 360 days    HDL  Date Value Ref Range Status  02/07/2021 59 >40 mg/dL Final         Passed - Triglycerides in normal range and within 360 days    Triglycerides  Date Value Ref Range Status  02/07/2021 87 <150 mg/dL Final         Passed - Valid encounter within last 12 months    Recent Outpatient Visits          3 weeks ago Essential hypertension   [facility] Glean Hess, MD   5 months ago Asthma in adult, moderate persistent, with acute exacerbation   [facility] Glean Hess, MD   1 year ago Annual physical exam   [facility] Glean Hess, MD   2 years ago Annual  physical exam   [facility] Glean Hess, MD   2 years ago Essential hypertension   [facility] Glean Hess, MD      Future Appointments            In 2 days Glean Hess, MD [facility], [place]   In 2 weeks End, Harrell Gave, MD [facility], [facility]   In 11 months Army Melia, Jesse Sans, MD [facility], [place]

## 2021-03-15 ENCOUNTER — Other Ambulatory Visit: Payer: Self-pay | Admitting: Internal Medicine

## 2021-03-16 ENCOUNTER — Inpatient Hospital Stay: Payer: Medicare Other | Admitting: Internal Medicine

## 2021-03-21 ENCOUNTER — Other Ambulatory Visit (HOSPITAL_COMMUNITY): Payer: Self-pay

## 2021-03-28 ENCOUNTER — Encounter: Payer: Self-pay | Admitting: Internal Medicine

## 2021-03-28 ENCOUNTER — Ambulatory Visit (INDEPENDENT_AMBULATORY_CARE_PROVIDER_SITE_OTHER): Payer: Medicare Other | Admitting: Internal Medicine

## 2021-03-28 ENCOUNTER — Other Ambulatory Visit: Payer: Self-pay

## 2021-03-28 VITALS — BP 140/90 | HR 110 | Ht 64.0 in | Wt 330.4 lb

## 2021-03-28 DIAGNOSIS — I10 Essential (primary) hypertension: Secondary | ICD-10-CM

## 2021-03-28 DIAGNOSIS — J449 Chronic obstructive pulmonary disease, unspecified: Secondary | ICD-10-CM | POA: Diagnosis not present

## 2021-03-28 DIAGNOSIS — I5032 Chronic diastolic (congestive) heart failure: Secondary | ICD-10-CM | POA: Diagnosis not present

## 2021-03-28 MED ORDER — DILTIAZEM HCL ER COATED BEADS 180 MG PO CP24: 180.0000 mg | ORAL_CAPSULE | Freq: Every day | ORAL | 1 refills | Status: DC

## 2021-03-28 NOTE — Progress Notes (Signed)
Follow-up Outpatient Visit Date: 03/28/2021  Primary Care Provider: Glean Hess, [place] [address] [place] [address]  Chief Complaint: Shortness of breath  HPI:  Ms. Mckibbin is a 70 y.o. female with history of chronic HFpEF, hypertension, asthma, stroke (1995), morbid obesity, and obstructive sleep apnea, who presents for follow-up of HFpEF.  She was last seen in the office in August by Tarri Glenn, [place], at which time she complained of some abdominal discomfort and bloating as well as an episode of rectal bleeding.  She was hospitalized in mid October with a asthma/COPD exacerbation.  She underwent myocardial PET/CT at [facility] in early October, which was normal.  Today, Ms. Cancio reports that she is still short of breath following her hospitalization last month, though her oxygen saturations and exertional dyspnea are improving.  She denies chest pain, palpitations, and lightheadedness.  She notes occasional mild leg edema.  Home BP is typically well-controlled.  She was previously on diltiazem but she stopped this medication when her prescription ran out a few weeks ago.  She completed her course of prednisone last week.  --------------------------------------------------------------------------------------------------  Cardiovascular History & Procedures: Cardiovascular Problems: Shortness of breath with history of HFpEF   Risk Factors: Hypertension, morbid obesity, and age greater than 20   Cath/PCI: None   CV Surgery: None   EP Procedures and Devices: None   Non-Invasive Evaluation(s): Myocardial PET/CT (02/22/2021, [facility]): Normal study without evidence of ischemia or scar.  LVEF greater than 60%.  No significant coronary artery calcification. TTE (08/31/2020): Normal LV size and wall thickness.  LVEF 55-60% with grade 1 diastolic dysfunction.  Normal RV size and function.  Normal biatrial size.  No significant valvular abnormality.  Normal CVP. Pharmacologic  MPI (07/02/2018): Low risk study without evidence of ischemia or scar.  LVEF 66%.  Recent CV Pertinent Labs: Lab Results  Component Value Date   CHOL 168 02/07/2021   HDL 59 02/07/2021   LDLCALC 92 02/07/2021   TRIG 87 02/07/2021   CHOLHDL 2.8 02/07/2021   INR 1.1 02/13/2021   INR 1.0 04/27/2012   BNP 65.1 03/08/2021   K 4.2 03/10/2021   K 3.6 05/25/2014   MG 1.9 03/08/2021   BUN 31 (H) 03/10/2021   BUN 15 11/16/2020   BUN 15 05/25/2014   CREATININE 1.36 (H) 03/10/2021   CREATININE 1.24 05/25/2014    Past medical and surgical history were reviewed and updated in EPIC.  Current Meds  Medication Sig   acetaminophen (TYLENOL) 500 MG tablet Take 1,000 mg by mouth every 6 (six) hours as needed.   albuterol (ACCUNEB) 1.25 MG/3ML nebulizer solution Take 3 mLs (1.25 mg total) by nebulization every 6 (six) hours as needed for wheezing.   benzonatate (TESSALON) 100 MG capsule Take 100 mg by mouth 3 (three) times daily as needed for cough.   budesonide-formoterol (SYMBICORT) 160-4.5 MCG/ACT inhaler Inhale 2 puffs into the lungs 2 (two) times daily.   cetirizine (ZYRTEC) 10 MG tablet Take 1 tablet (10 mg total) by mouth daily.   cholestyramine (QUESTRAN) 4 g packet DISSOLVE AND DRINK 1 PACKET BY MOUTH AT BEDTIME   diltiazem (CARDIZEM CD) 180 MG 24 hr capsule Take 1 capsule (180 mg total) by mouth daily.   furosemide (LASIX) 20 MG tablet Take 1 tablet (20 mg total) by mouth daily as needed (swelling or wt gain).   Ipratropium-Albuterol (COMBIVENT) 20-100 MCG/ACT AERS respimat Inhale 1 puff into the lungs every 6 (six) hours as needed for wheezing  or shortness of breath.   ipratropium-albuterol (DUONEB) 0.5-2.5 (3) MG/3ML SOLN Take 3 mLs by nebulization every 4 (four) hours as needed.   lisinopril (ZESTRIL) 10 MG tablet TAKE 1 TABLET BY MOUTH DAILY.   multivitamin-iron-minerals-folic acid (CENTRUM) chewable tablet Chew 1 tablet by mouth daily.   pantoprazole (PROTONIX) 40 MG tablet TAKE 1  TABLET BY MOUTH DAILY   PREVALITE 4 g packet Take 1 packet by mouth at bedtime.   promethazine (PHENERGAN) 25 MG tablet TAKE 1 TABLET BY MOUTH EVERY 6 HOURS AS NEEDED FOR NAUSEA OR VOMITING.   rOPINIRole (REQUIP) 1 MG tablet TAKE 1 TABLET BY MOUTH 3 TIMES DAILY AS NEEDED.    Allergies: Nuvigil [armodafinil], Penicillins, Gabapentin, and Provigil [modafinil]  Social History   Tobacco Use   Smoking status: Never   Smokeless tobacco: Never  Vaping Use   Vaping Use: Never used  Substance Use Topics   Alcohol use: No    Alcohol/week: 0.0 standard drinks   Drug use: No    Family History  Problem Relation Age of Onset   Dementia Mother    COPD Father        6   Diabetes Father    Heart failure Brother    Coronary artery disease Brother 64       CABG   Heart failure Maternal Grandmother    Stroke Maternal Grandmother    Breast cancer Neg Hx     Review of Systems: A 12-system review of systems was performed and was negative except as noted in the HPI.  --------------------------------------------------------------------------------------------------  Physical Exam: BP 140/90 (BP Location: Left Arm, Patient Position: Sitting, Cuff Size: Large)   Pulse (!) 110   Ht 5\' 4"  (1.626 m)   Wt (!) 330 lb 6 oz (149.9 kg)   SpO2 95%   BMI 56.71 kg/m   General:  NAD. Neck: Unable to assess JVP due to body habitus. Lungs: Mildly diminished breath sounds throughout with scattered rhonchi.  No wheezes or crackles. Heart: Distant heart sounds.  Tachycardic but regular without murmurs. Abdomen: Soft, nontender, nondistended. Extremities: Trace ankle edema bilaterally.  EKG:  Sinus tachycardia.  Lab Results  Component Value Date   WBC 10.0 03/10/2021   HGB 10.3 (L) 03/10/2021   HCT 32.4 (L) 03/10/2021   MCV 78.3 (L) 03/10/2021   PLT 323 03/10/2021    Lab Results  Component Value Date   NA 134 (L) 03/10/2021   K 4.2 03/10/2021   CL 94 (L) 03/10/2021   CO2 34 (H)  03/10/2021   BUN 31 (H) 03/10/2021   CREATININE 1.36 (H) 03/10/2021   GLUCOSE 103 (H) 03/10/2021   ALT 10 03/05/2021    Lab Results  Component Value Date   CHOL 168 02/07/2021   HDL 59 02/07/2021   LDLCALC 92 02/07/2021   TRIG 87 02/07/2021   CHOLHDL 2.8 02/07/2021    --------------------------------------------------------------------------------------------------  ASSESSMENT AND PLAN: Chronic HFpEF: Volume assessment is challenging due to body habitus, though I suspect Ms. Eagleton is grossly euvolemic.  Given her resting tachycardia and mild hypertension, we will restart diltiazem 180 mg daily.  Continue prn furosemide.  Recent myocardial PET/CT at [facility] was reassuring without evidence of ischemia, scar, or coronary artery calcification.  Obstructive lung disease: Continue current inhalers and follow-up with Dr. Mortimer Fries at her earliest convenience.  Hypertension: BP mildly elevated today.  We will restart diltiazem and continue current dose of lisinopril.  Morbid obesity: Weight loss encouraged through diet and exercise.  Follow-up: Return  to clinic in 3 months.  Nelva Bush, MD 03/28/2021 7:30 PM

## 2021-03-28 NOTE — Patient Instructions (Signed)
Medication Instructions:   Your physician has recommended you make the following change in your medication:   RESTART Diltiazem 180 mg daily (this was added back to your med list as you were taking)  *If you need a refill on your cardiac medications before your next appointment, please call your pharmacy*   Lab Work:  None ordered  Testing/Procedures:  None ordered   Follow-Up: At [facility], you and your health needs are our priority.  As part of our continuing mission to provide you with exceptional heart care, we have created designated Provider Care Teams.  These Care Teams include your primary Cardiologist (physician) and Advanced Practice Providers (APPs -  Physician Assistants and Nurse Practitioners) who all work together to provide you with the care you need, when you need it.  We recommend signing up for the patient portal called "MyChart".  Sign up information is provided on this After Visit Summary.  MyChart is used to connect with patients for Virtual Visits (Telemedicine).  Patients are able to view lab/test results, encounter notes, upcoming appointments, etc.  Non-urgent messages can be sent to your provider as well.   To learn more about what you can do with MyChart, go to [email or web address].    Your next appointment:   3 month(s)  The format for your next appointment:   In Person  Provider:   You may see Nelva Bush, MD or one of the following Advanced Practice Providers on your designated Care Team:   Murray Hodgkins, NP Christell Faith, PA-C Cadence Kathlen Mody, [place]

## 2021-04-07 ENCOUNTER — Other Ambulatory Visit: Payer: Self-pay | Admitting: Internal Medicine

## 2021-04-07 NOTE — Telephone Encounter (Signed)
Requested medication (s) are due for refill today: yes  Requested medication (s) are on the active medication list: yes  Last refill:  02/27/21 #30  Future visit scheduled: yes  Notes to clinic:  med not delegated to NT to RF   Requested Prescriptions  Pending Prescriptions Disp Refills   promethazine (PHENERGAN) 25 MG tablet [Pharmacy Med Name: PROMETHAZINE 25 MG TABLET] 30 tablet 0    Sig: TAKE 1 TABLET BY MOUTH EVERY 6 HOURS AS NEEDED FOR NAUSEA OR VOMITING.     Not Delegated - Gastroenterology: Antiemetics Failed - 04/07/2021  2:19 PM      Failed - This refill cannot be delegated      Passed - Valid encounter within last 6 months    Recent Outpatient Visits           1 month ago Essential hypertension   [place], [place]   6 months ago Asthma in adult, moderate persistent, with acute exacerbation   [facility] Glean Hess, MD   1 year ago Annual physical exam   [facility] Glean Hess, MD   2 years ago Annual physical exam   [facility] Glean Hess, MD   2 years ago Essential hypertension   [place], MD       Future Appointments             In 2 months Sharolyn Douglas Clance Boll, NP [facility], [facility]   In 10 months Army Melia, Jesse Sans, MD [facility], [facility]

## 2021-04-11 ENCOUNTER — Other Ambulatory Visit: Payer: Self-pay | Admitting: Internal Medicine

## 2021-04-13 ENCOUNTER — Other Ambulatory Visit: Payer: Self-pay

## 2021-04-13 ENCOUNTER — Other Ambulatory Visit: Payer: Self-pay | Admitting: Medical

## 2021-04-25 ENCOUNTER — Other Ambulatory Visit: Payer: Self-pay | Admitting: Internal Medicine

## 2021-04-25 NOTE — Telephone Encounter (Signed)
Requested medications are due for refill today.  yes  Requested medications are on the active medications list.  yes  Last refill. 04/09/2021  Future visit scheduled.   yes  Notes to clinic.  Medication not delegated.    Requested Prescriptions  Pending Prescriptions Disp Refills   promethazine (PHENERGAN) 25 MG tablet [Pharmacy Med Name: PROMETHAZINE 25 MG TABLET] 30 tablet 0    Sig: TAKE 1 TABLET BY MOUTH EVERY 6 HOURS AS NEEDED FOR NAUSEA OR VOMITING.     Not Delegated - Gastroenterology: Antiemetics Failed - 04/25/2021  5:06 PM      Failed - This refill cannot be delegated      Passed - Valid encounter within last 6 months    Recent Outpatient Visits           2 months ago Essential hypertension   [place], MD   6 months ago Asthma in adult, moderate persistent, with acute exacerbation   [facility] Glean Hess, MD   1 year ago Annual physical exam   [facility] Glean Hess, MD   2 years ago Annual physical exam   [facility] Glean Hess, MD   2 years ago Essential hypertension   [place], MD       Future Appointments             In 2 months Sharolyn Douglas Clance Boll, NP [facility], [facility]   In 10 months Army Melia, Jesse Sans, MD [facility], [facility]

## 2021-05-02 ENCOUNTER — Ambulatory Visit: Payer: Medicare Other | Admitting: Pulmonary Disease

## 2021-05-10 ENCOUNTER — Other Ambulatory Visit: Payer: Self-pay | Admitting: Internal Medicine

## 2021-05-10 NOTE — Telephone Encounter (Deleted)
Requested Prescriptions  Pending Prescriptions Disp Refills   rOPINIRole (REQUIP) 1 MG tablet [Pharmacy Med Name: ROPINIROLE HCL 1 MG TABLET] 90 tablet 0    Sig: TAKE 1 TABLET BY MOUTH THREE TIMES A DAY AS NEEDED     Neurology:  Parkinsonian Agents Failed - 05/10/2021  1:36 PM      Failed - Last BP in normal range    BP Readings from Last 1 Encounters:  03/28/21 140/90         Passed - Valid encounter within last 12 months    Recent Outpatient Visits          2 months ago Essential hypertension   [facility] Glean Hess, MD   7 months ago Asthma in adult, moderate persistent, with acute exacerbation   [facility] Glean Hess, MD   1 year ago Annual physical exam   [facility] Glean Hess, MD   2 years ago Annual physical exam   [facility] Glean Hess, MD   2 years ago Essential hypertension   [place], MD      Future Appointments            In 1 month Sharolyn Douglas, Clance Boll, NP [facility], [facility]   In 9 months Army Melia, Jesse Sans, MD [facility], [place]

## 2021-05-11 ENCOUNTER — Other Ambulatory Visit: Payer: Self-pay | Admitting: Internal Medicine

## 2021-05-11 NOTE — Telephone Encounter (Signed)
Requested medication (s) are due for refill today:   Provider to review  Requested medication (s) are on the active medication list:   Yes  Future visit scheduled:   Yes   Last ordered: 04/26/2021 #30, 0 refills  Non delegated refill   Requested Prescriptions  Pending Prescriptions Disp Refills   promethazine (PHENERGAN) 25 MG tablet [Pharmacy Med Name: PROMETHAZINE 25 MG TABLET] 30 tablet 0    Sig: TAKE 1 TABLET BY MOUTH EVERY 6 HOURS AS NEEDED FOR NAUSEA OR VOMITING.     Not Delegated - Gastroenterology: Antiemetics Failed - 05/11/2021  7:27 AM      Failed - This refill cannot be delegated      Passed - Valid encounter within last 6 months    Recent Outpatient Visits           2 months ago Essential hypertension   [facility] Glean Hess, MD   7 months ago Asthma in adult, moderate persistent, with acute exacerbation   [facility] Glean Hess, MD   1 year ago Annual physical exam   [facility] Glean Hess, MD   2 years ago Annual physical exam   [facility] Glean Hess, MD   2 years ago Essential hypertension   [place], MD       Future Appointments             In 1 month Sharolyn Douglas, Clance Boll, NP [facility], [facility]   In 9 months Army Melia, Jesse Sans, MD [facility], [place]

## 2021-05-18 ENCOUNTER — Other Ambulatory Visit: Payer: Self-pay | Admitting: Internal Medicine

## 2021-05-18 NOTE — Telephone Encounter (Signed)
Refill of Ropinirole sent to requested pharmacy on 05/10/21.  Phenergan last filled on 04/26/21 #30 To pharmacy: PT IS REQUESTING MORE THAN A 7 DAY SUPPLY IF POSSIBLE. [place]

## 2021-05-27 ENCOUNTER — Other Ambulatory Visit: Payer: Self-pay | Admitting: Internal Medicine

## 2021-05-31 ENCOUNTER — Telehealth: Payer: Self-pay

## 2021-05-31 ENCOUNTER — Ambulatory Visit (INDEPENDENT_AMBULATORY_CARE_PROVIDER_SITE_OTHER): Payer: Medicare Other | Admitting: Pulmonary Disease

## 2021-05-31 ENCOUNTER — Encounter: Payer: Self-pay | Admitting: Pulmonary Disease

## 2021-05-31 ENCOUNTER — Other Ambulatory Visit: Payer: Self-pay

## 2021-05-31 VITALS — BP 138/88 | HR 104 | Temp 97.5°F | Ht 64.0 in | Wt 322.0 lb

## 2021-05-31 DIAGNOSIS — E559 Vitamin D deficiency, unspecified: Secondary | ICD-10-CM

## 2021-05-31 DIAGNOSIS — R053 Chronic cough: Secondary | ICD-10-CM

## 2021-05-31 DIAGNOSIS — G4733 Obstructive sleep apnea (adult) (pediatric): Secondary | ICD-10-CM

## 2021-05-31 DIAGNOSIS — R0602 Shortness of breath: Secondary | ICD-10-CM | POA: Diagnosis not present

## 2021-05-31 DIAGNOSIS — D509 Iron deficiency anemia, unspecified: Secondary | ICD-10-CM

## 2021-05-31 DIAGNOSIS — Z6841 Body Mass Index (BMI) 40.0 and over, adult: Secondary | ICD-10-CM

## 2021-05-31 DIAGNOSIS — Z9989 Dependence on other enabling machines and devices: Secondary | ICD-10-CM

## 2021-05-31 DIAGNOSIS — J4540 Moderate persistent asthma, uncomplicated: Secondary | ICD-10-CM | POA: Diagnosis not present

## 2021-05-31 MED ORDER — OLMESARTAN MEDOXOMIL 5 MG PO TABS: 5.0000 mg | ORAL_TABLET | Freq: Every day | ORAL | 0 refills | Status: DC

## 2021-05-31 MED ORDER — LEVALBUTEROL HCL 1.25 MG/0.5ML IN NEBU: 1.2500 mg | INHALATION_SOLUTION | Freq: Four times a day (QID) | RESPIRATORY_TRACT | 2 refills | Status: DC | PRN

## 2021-05-31 MED ORDER — TRELEGY ELLIPTA 200-62.5-25 MCG/ACT IN AEPB: 200.0000 ug | INHALATION_SPRAY | Freq: Every day | RESPIRATORY_TRACT | 0 refills | Status: DC

## 2021-05-31 MED ORDER — BENZONATATE 100 MG PO CAPS: 100.0000 mg | ORAL_CAPSULE | Freq: Three times a day (TID) | ORAL | 1 refills | Status: DC | PRN

## 2021-05-31 NOTE — Telephone Encounter (Signed)
[facility] in [facility] to [facility]ol[facility] however it sates the other alternative is Levalbuterol. Tried calling to clarify but was left on hold for 10+ minutes,

## 2021-05-31 NOTE — Progress Notes (Signed)
Subjective:    Patient ID: Samantha Mason, female    DOB: [birth year], 71 y.o.   MRN: [id number] Chief Complaint  Patient presents with   Follow-up   HPI Patient is a 71 year old lifelong never smoker, with morbid obesity and obstructive sleep apnea on CPAP, patient of Dr. Patricia Pesa who presents today for follow-up on the issue of dyspnea.  I saw this patient as an acute visit on 05 February 2021 and at that time he was noted that she had had 2 admissions to the hospital for "asthma exacerbations" since April 2022.  She last saw Dr. Mortimer Fries on 28 November 2020.  Her last visit here was on 05 March 2021 when she was seen by Geraldo Pitter, NP.  Prior to that she had an inconclusive VQ scan on 26 September and a CTA on 27 September that was negative for PE or pneumonia.  During her visit in October she was referred to the ED due to increased breathlessness and hypoxia on room air.  Was admitted at that time to [facility] for asthma exacerbation discharged on 27 October.  In the interim she has seen cardiology (Dr. Saunders Revel) who reinstated diltiazem at the time of the visit of 9 November.  She uses Symbicort twice a day.  Was previously on Spiriva but not covered by her insurance.  Uses Combivent with some mixed results.  She notes that she gets shortness of breath with minimal exertion like walking from her bedroom to the bathroom.  She has issues with diastolic dysfunction and congestive heart failure with preserved left ventricular ejection fraction.  Cardiology  ordered an ischemia work-up imaging at [facility] was negative for ischemia.  No orthopnea or paroxysmal nocturnal dyspnea.  She has had a dry cough has been nonproductive.  Of note she is on an ACE inhibitor.  She has not had any fevers, chills or sweats.  No sputum production and no hemoptysis.  She has had issues with inability to wear CPAP due to the machine being broken.  She had a replacement machine but this was a refurbished unit and it has not been  working well for her.  She has not had this evaluated by [employer] her DME company.  She has issues with morbid obesity and states that she cannot seem to "lose weight" she states that she is not eating much however she does admit to drinking sweetened iced tea throughout the day.  She was advised that this is likely adding to her weight issues.   Review of Systems A 10 point review of systems was performed and it is as noted above otherwise negative.   Current Meds  Medication Sig   acetaminophen (TYLENOL) 500 MG tablet Take 1,000 mg by mouth every 6 (six) hours as needed.   albuterol (ACCUNEB) 1.25 MG/3ML nebulizer solution Take 3 mLs (1.25 mg total) by nebulization every 6 (six) hours as needed for wheezing.   benzonatate (TESSALON) 100 MG capsule Take 100 mg by mouth 3 (three) times daily as needed for cough.   budesonide-formoterol (SYMBICORT) 160-4.5 MCG/ACT inhaler Inhale 2 puffs into the lungs 2 (two) times daily.   cetirizine (ZYRTEC) 10 MG tablet Take 1 tablet (10 mg total) by mouth daily.   cholestyramine (QUESTRAN) 4 g packet DISSOLVE AND DRINK 1 PACKET BY MOUTH AT BEDTIME   diltiazem (CARDIZEM CD) 180 MG 24 hr capsule Take 1 capsule (180 mg total) by mouth daily.   furosemide (LASIX) 20 MG tablet Take 1 tablet (20 mg  total) by mouth daily as needed (swelling or wt gain).   ipratropium-albuterol (DUONEB) 0.5-2.5 (3) MG/3ML SOLN TAKE 3 MLS BY NEBULIZATION EVERY 4 (FOUR) HOURS AS NEEDED.   lisinopril (ZESTRIL) 10 MG tablet TAKE 1 TABLET BY MOUTH DAILY.   pantoprazole (PROTONIX) 40 MG tablet TAKE 1 TABLET BY MOUTH DAILY   PREVALITE 4 g packet Take 1 packet by mouth at bedtime.   promethazine (PHENERGAN) 25 MG tablet TAKE 1 TABLET BY MOUTH EVERY 6 HOURS AS NEEDED FOR NAUSEA OR VOMITING.   rOPINIRole (REQUIP) 1 MG tablet TAKE 1 TABLET BY MOUTH THREE TIMES A DAY AS NEEDED   [DISCONTINUED] Ipratropium-Albuterol (COMBIVENT) 20-100 MCG/ACT AERS respimat Inhale 1 puff into the lungs every 6  (six) hours as needed for wheezing or shortness of breath.   Immunization History  Administered Date(s) Administered   Fluad Quad(high Dose 65+) 02/15/2019, 02/16/2020, 02/19/2021   Influenza Split 02/23/2018   Influenza-Unspecified 02/16/2014, 01/18/2017   Moderna Sars-Covid-2 Vaccination 01/05/2020, 02/01/2020   Pneumococcal Conjugate-13 05/16/2014   Pneumococcal Polysaccharide-23 03/12/2017, 07/16/2017       Objective:   Physical Exam BP 138/88 (BP Location: Left Arm, Patient Position: Sitting, Cuff Size: Normal)    Pulse (!) 104    Temp (!) 97.5 F (36.4 C) (Oral)    Ht 5\' 4"  (1.626 m)    Wt (!) 322 lb (146.1 kg)    SpO2 96%    BMI 55.27 kg/m  GENERAL: Morbidly obese woman, appears chronically ill.  No respiratory distress but some mild tachypnea.  She is wearing oxygen at 2 L/min.  Presents in transport chair. HEAD: Normocephalic, atraumatic.  EYES: Pupils equal, round, reactive to light.  No scleral icterus.  MOUTH: Oral mucosa moist.  No thrush. NECK: Supple. No thyromegaly. Trachea midline. No JVD.  No adenopathy. PULMONARY: Good air entry bilaterally.  She is moving air very well she has absolutely no adventitious sounds. CARDIOVASCULAR: S1 and S2.  Mild tachycardia, regular rhythm.  Cannot appreciate murmur due to body habitus. ABDOMEN: Obese, otherwise benign. MUSCULOSKELETAL: No joint deformity, no clubbing, he has nonpitting edema of the lower extremities 2+ (query lipedema).  Decreased muscle mass. NEUROLOGIC: No overt focal deficit.  Gait not tested SKIN: Intact,warm,dry. PSYCH: Flat affect, normal behavior.  Recent Results (from the past 2160 hour(s))  CBC     Status: Abnormal   Collection Time: 03/05/21  3:25 PM  Result Value Ref Range   WBC 9.4 4.0 - 10.5 K/uL   RBC 4.28 3.87 - 5.11 MIL/uL   Hemoglobin 10.6 (L) 12.0 - 15.0 g/dL   HCT 34.2 (L) 36.0 - 46.0 %   MCV 79.9 (L) 80.0 - 100.0 fL   MCH 24.8 (L) 26.0 - 34.0 pg   MCHC 31.0 30.0 - 36.0 g/dL   RDW 15.6  (H) 11.5 - 15.5 %   Platelets 297 150 - 400 K/uL   nRBC 0.0 0.0 - 0.2 %    Comment: Performed at [facility], [address], [place], [place] [address]  Comprehensive metabolic panel     Status: Abnormal   Collection Time: 03/05/21  3:25 PM  Result Value Ref Range   Sodium 139 135 - 145 mmol/L   Potassium 3.9 3.5 - 5.1 mmol/L   Chloride 100 98 - 111 mmol/L   CO2 29 22 - 32 mmol/L   Glucose, Bld 97 70 - 99 mg/dL    Comment: Glucose reference range applies only to samples taken after fasting for at least 8 hours.  BUN 15 8 - 23 mg/dL   Creatinine, Ser 1.11 (H) 0.44 - 1.00 mg/dL   Calcium 9.5 8.9 - 10.3 mg/dL   Total Protein 7.1 6.5 - 8.1 g/dL   Albumin 3.2 (L) 3.5 - 5.0 g/dL   AST 16 15 - 41 U/L   ALT 10 0 - 44 U/L   Alkaline Phosphatase 71 38 - 126 U/L   Total Bilirubin 0.6 0.3 - 1.2 mg/dL   GFR, Estimated 54 (L) >60 mL/min    Comment: (NOTE) Calculated using the CKD-EPI Creatinine Equation (2021)    Anion gap 10 5 - 15    Comment: Performed at [facility], [place], [place] [address]  Troponin I (High Sensitivity)     Status: None   Collection Time: 03/05/21  3:25 PM  Result Value Ref Range   Troponin I (High Sensitivity) 8 <18 ng/L    Comment: (NOTE) Elevated high sensitivity troponin I (hsTnI) values and significant  changes across serial measurements may suggest ACS but many other  chronic and acute conditions are known to elevate hsTnI results.  Refer to the "Links" section for chest pain algorithms and additional  guidance. Performed at [facility], [address] [place], [place] [address]   Brain natriuretic peptide     Status: None   Collection Time: 03/05/21  3:25 PM  Result Value Ref Range   B Natriuretic Peptide 23.9 0.0 - 100.0 pg/mL    Comment: Performed at [facility], [place], [place] [address]  Troponin I (High Sensitivity)     Status: None   Collection Time: 03/05/21  9:39 PM   Result Value Ref Range   Troponin I (High Sensitivity) 8 <18 ng/L    Comment: (NOTE) Elevated high sensitivity troponin I (hsTnI) values and significant  changes across serial measurements may suggest ACS but many other  chronic and acute conditions are known to elevate hsTnI results.  Refer to the "Links" section for chest pain algorithms and additional  guidance. Performed at [facility], [address] [place], [place] [address]   Magnesium     Status: None   Collection Time: 03/05/21  9:39 PM  Result Value Ref Range   Magnesium 1.9 1.7 - 2.4 mg/dL    Comment: Performed at [facility], [address] [place], [place] [address]  Resp Panel by RT-PCR (Flu A&B, Covid) Nasopharyngeal Swab     Status: None   Collection Time: 03/06/21 12:14 AM   Specimen: Nasopharyngeal Swab; Nasopharyngeal(NP) swabs in vial transport medium  Result Value Ref Range   SARS Coronavirus 2 by RT PCR NEGATIVE NEGATIVE    Comment: (NOTE) SARS-CoV-2 target nucleic acids are NOT DETECTED.  The SARS-CoV-2 RNA is generally detectable in upper respiratory specimens during the acute phase of infection. The lowest concentration of SARS-CoV-2 viral copies this assay can detect is 138 copies/mL. A negative result does not preclude SARS-Cov-2 infection and should not be used as the sole basis for treatment or other patient management decisions. A negative result may occur with  improper specimen collection/handling, submission of specimen other than nasopharyngeal swab, presence of viral mutation(s) within the areas targeted by this assay, and inadequate number of viral copies(<138 copies/mL). A negative result must be combined with clinical observations, patient history, and epidemiological information. The expected result is Negative.  Fact Sheet for Patients:  EntrepreneurPulse.com.au  Fact Sheet for Healthcare Providers:   IncredibleEmployment.be  This test is no t  yet approved or cleared by the [place] and  has been authorized for detection and/or diagnosis of SARS-CoV-2 by FDA under an Emergency Use Authorization (EUA). This EUA will remain  in effect (meaning this test can be used) for the duration of the COVID-19 declaration under Section 564(b)(1) of the Act, 21 U.S.C.section 360bbb-3(b)(1), unless the authorization is terminated  or revoked sooner.       Influenza A by PCR NEGATIVE NEGATIVE   Influenza B by PCR NEGATIVE NEGATIVE    Comment: (NOTE) The Xpert Xpress SARS-CoV-2/FLU/RSV plus assay is intended as an aid in the diagnosis of influenza from Nasopharyngeal swab specimens and should not be used as a sole basis for treatment. Nasal washings and aspirates are unacceptable for Xpert Xpress SARS-CoV-2/FLU/RSV testing.  Fact Sheet for Patients: EntrepreneurPulse.com.au  Fact Sheet for Healthcare Providers: IncredibleEmployment.be  This test is not yet approved or cleared by the [place] FDA and has been authorized for detection and/or diagnosis of SARS-CoV-2 by FDA under an Emergency Use Authorization (EUA). This EUA will remain in effect (meaning this test can be used) for the duration of the COVID-19 declaration under Section 564(b)(1) of the Act, 21 U.S.C. section 360bbb-3(b)(1), unless the authorization is terminated or revoked.  Performed at [facility], [address] [place], [place] [address]   Basic metabolic panel     Status: Abnormal   Collection Time: 03/08/21  5:45 AM  Result Value Ref Range   Sodium 130 (L) 135 - 145 mmol/L   Potassium 4.0 3.5 - 5.1 mmol/L   Chloride 94 (L) 98 - 111 mmol/L   CO2 29 22 - 32 mmol/L   Glucose, Bld 97 70 - 99 mg/dL    Comment: Glucose reference range applies only to samples taken after fasting for at least 8 hours.   BUN 30 (H) 8 - 23 mg/dL   Creatinine,  Ser 1.45 (H) 0.44 - 1.00 mg/dL   Calcium 8.9 8.9 - 10.3 mg/dL   GFR, Estimated 39 (L) >60 mL/min    Comment: (NOTE) Calculated using the CKD-EPI Creatinine Equation (2021)    Anion gap 7 5 - 15    Comment: Performed at [facility], [address] [place], [place] [address]  Magnesium     Status: None   Collection Time: 03/08/21  5:45 AM  Result Value Ref Range   Magnesium 1.9 1.7 - 2.4 mg/dL    Comment: Performed at [facility], [address] [place], [place] [address]  Osmolality     Status: None   Collection Time: 03/08/21  5:45 AM  Result Value Ref Range   Osmolality 285 275 - 295 mOsm/kg    Comment: Performed at [facility], [address] [place], [place] [address]  D-dimer, quantitative     Status: Abnormal   Collection Time: 03/08/21  2:13 PM  Result Value Ref Range   D-Dimer, Quant 0.71 (H) 0.00 - 0.50 ug/mL-FEU    Comment: (NOTE) At the manufacturer cut-off value of 0.5 g/mL FEU, this assay has a negative predictive value of 95-100%.This assay is intended for use in conjunction with a clinical pretest probability (PTP) assessment model to exclude pulmonary embolism (PE) and deep venous thrombosis (DVT) in outpatients suspected of PE or DVT. Results should be correlated with clinical presentation. Performed at [facility], [address] [place], [place] [address]   Troponin I (High Sensitivity)     Status: None   Collection Time: 03/08/21  2:13 PM  Result Value Ref Range   Troponin I (High Sensitivity) 9 <18 ng/L    Comment: (NOTE) Elevated high sensitivity troponin I (hsTnI) values and significant  changes across serial measurements may suggest ACS but many other  chronic and acute conditions are known to elevate hsTnI results.  Refer to the "Links" section for chest pain algorithms and additional  guidance. Performed at [facility], [address] [place], [place] [address]   Brain natriuretic  peptide     Status: None   Collection Time: 03/08/21  2:13 PM  Result Value Ref Range   B Natriuretic Peptide 65.1 0.0 - 100.0 pg/mL    Comment: Performed at [facility], [address] [place], [place] [address]  Basic metabolic panel     Status: Abnormal   Collection Time: 03/09/21  2:51 AM  Result Value Ref Range   Sodium 134 (L) 135 - 145 mmol/L   Potassium 4.2 3.5 - 5.1 mmol/L   Chloride 93 (L) 98 - 111 mmol/L   CO2 33 (H) 22 - 32 mmol/L   Glucose, Bld 111 (H) 70 - 99 mg/dL    Comment: Glucose reference range applies only to samples taken after fasting for at least 8 hours.   BUN 30 (H) 8 - 23 mg/dL   Creatinine, Ser 1.61 (H) 0.44 - 1.00 mg/dL   Calcium 8.9 8.9 - 10.3 mg/dL   GFR, Estimated 35 (L) >60 mL/min    Comment: (NOTE) Calculated using the CKD-EPI Creatinine Equation (2021)    Anion gap 8 5 - 15    Comment: Performed at [facility], [address] [place], [place] [address]  VITAMIN D 25 Hydroxy (Vit-D Deficiency, Fractures)     Status: Abnormal   Collection Time: 03/10/21  3:24 AM  Result Value Ref Range   Vit D, 25-Hydroxy 24.39 (L) 30 - 100 ng/mL    Comment: (NOTE) Vitamin D deficiency has been defined by the [place] practice guideline as a level of serum 25-OH  vitamin D less than 20 ng/mL (1,2). The Endocrine Society went on to  further define vitamin D insufficiency as a level between 21 and 29  ng/mL (2).  1. IOM (Institute of Medicine). 2010. Dietary reference intakes for  calcium and D. [place]: The [employer]. 2. Holick MF, [place], Bischoff-Ferrari HA, et al. Evaluation,  treatment, and prevention of vitamin D deficiency: an Endocrine  Society clinical practice guideline, JCEM. 2011 Jul; 96(7): 1911-30.  Performed at [facility], [place] [address] [place], [place] [address]   Basic metabolic panel     Status: Abnormal   Collection Time: 03/10/21  3:24 AM   Result Value Ref Range   Sodium 134 (L) 135 - 145 mmol/L   Potassium 4.2 3.5 - 5.1 mmol/L   Chloride 94 (L) 98 - 111 mmol/L   CO2 34 (H) 22 - 32 mmol/L   Glucose, Bld 103 (H) 70 - 99 mg/dL    Comment: Glucose reference range applies only to samples taken after fasting for at least 8 hours.   BUN 31 (H) 8 - 23 mg/dL   Creatinine, Ser 1.36 (H) 0.44 - 1.00 mg/dL   Calcium 8.7 (L) 8.9 - 10.3 mg/dL   GFR, Estimated 42 (L) >60 mL/min    Comment: (NOTE) Calculated using the CKD-EPI Creatinine Equation (2021)    Anion gap 6 5 - 15    Comment:  Performed at [facility], [address] [place], [place] [address]  CBC     Status: Abnormal   Collection Time: 03/10/21  3:24 AM  Result Value Ref Range   WBC 10.0 4.0 - 10.5 K/uL   RBC 4.14 3.87 - 5.11 MIL/uL   Hemoglobin 10.3 (L) 12.0 - 15.0 g/dL   HCT 32.4 (L) 36.0 - 46.0 %   MCV 78.3 (L) 80.0 - 100.0 fL   MCH 24.9 (L) 26.0 - 34.0 pg   MCHC 31.8 30.0 - 36.0 g/dL   RDW 15.6 (H) 11.5 - 15.5 %   Platelets 323 150 - 400 K/uL   nRBC 0.0 0.0 - 0.2 %    Comment: Performed at [facility], [address], [place], [place] [address]       Assessment & Plan:     ICD-10-CM   1. Moderate persistent asthma without complication  U98.11    Trial of Trelegy Ellipta 200,1 puff daily Levoalbuterol for as needed use     2. Shortness of breath  R06.02    Multifactorial Heart failure with preserved ejection fraction Extreme obesity with obesity hypoventilation likely Poorly compensated persistent asthma    3. Chronic cough  R05.3    Patient is on ACE inhibitor Discontinue ACE inhibitor Trial of olmesartan 5 mg daily Discussed with Dr. Saunders Revel via secure chat    4. OSA on CPAP  G47.33 AMB REFERRAL FOR DME   Z99.89    Has been unable to use CPAP machine State machine is "broken" Referral to [employer] for replacement as needed    5. Microcytic anemia  D50.9    Defer to primary care for work-up This issue can aggravate her  dyspnea    6. Vitamin D deficiency  E55.9    Patient will need supplementation Defer to primary care for recommendations    7. Morbid obesity with BMI of 50.0-59.9, adult (HCC)  E66.01    Z68.43    Needs weight loss Reduce sugar intake Consider dietitian counseling     We are giving the patient a trial of medications as above.  Recommend follow-up with our nurse practitioner in 2 to 3 weeks time then follow-up with Dr. Mortimer Fries her regular pulmonologist who also manages her OSA/CPAP.  She has issues with extreme obesity with sarcopenia this is aggravating her issues with shortness of breath and also likely causing poor control of her asthma.  Other recommendations as above.  Definitely needs to be off of ACE inhibitor, given her trial of olmesartan to see how she does with this and discontinue lisinopril.  Renold Don, MD Advanced Bronchoscopy PCCM [facility]    *This note was dictated using voice recognition software/Dragon.  Despite best efforts to proofread, errors can occur which can change the meaning. Any transcriptional errors that result from this process are unintentional and may not be fully corrected at the time of dictation.

## 2021-05-31 NOTE — Patient Instructions (Addendum)
One of the medications that you are on for your heart issues causes or aggravate cough.  This 1 is lisinopril.  We are switching to a different medication.  I will notify Dr. Saunders Revel of this change.  The new medication is olmersartan Nurse, mental health) which appears to be covered by your insurance.  STOP LISINOPRIL.  We are giving you a trial of an inhaler called Trelegy Ellipta 200 this is 1 puff daily, make sure you rinse your mouth well after you use it.  Let us know how you are doing on this inhaler so we can call it into your pharmacy..  DO NOT USE SYMBICORT WHILE USING THE TRELEGY.  We are giving you a new medication for your nebulizer to see if this helps with not increasing your heart rate.  This medication is called Xopenex or levoalbuterol.  DO NOT USE DUONEB OR COMBIVENT WHILE ON THE XOPENEX.  We have sent a request for [employer] to check your CPAP machine.   We will see him in follow-up in 2 to 3 weeks time with the nurse practitioner at that time.  Then we will get you back in schedule with Dr. Mortimer Fries.

## 2021-06-02 ENCOUNTER — Other Ambulatory Visit: Payer: Self-pay | Admitting: Internal Medicine

## 2021-06-02 NOTE — Telephone Encounter (Signed)
Requested Prescriptions  Pending Prescriptions Disp Refills   rOPINIRole (REQUIP) 1 MG tablet [Pharmacy Med Name: ROPINIROLE HCL 1 MG TABLET] 90 tablet 0    Sig: TAKE 1 TABLET BY MOUTH THREE TIMES A DAY AS NEEDED     Neurology:  Parkinsonian Agents Passed - 06/02/2021  1:23 PM      Passed - Last BP in normal range    BP Readings from Last 1 Encounters:  05/31/21 138/88         Passed - Valid encounter within last 12 months    Recent Outpatient Visits          3 months ago Essential hypertension   [facility] Glean Hess, MD   8 months ago Asthma in adult, moderate persistent, with acute exacerbation   [facility] Glean Hess, MD   1 year ago Annual physical exam   [facility] Glean Hess, MD   2 years ago Annual physical exam   [facility] Glean Hess, MD   2 years ago Essential hypertension   [place], MD      Future Appointments            In 3 weeks Sharolyn Douglas Clance Boll, NP [facility], [facility]   In 8 months Army Melia, Jesse Sans, MD [facility], [facility]

## 2021-06-04 ENCOUNTER — Telehealth: Payer: Self-pay | Admitting: Pulmonary Disease

## 2021-06-04 NOTE — Telephone Encounter (Signed)
Samantha Mason from [facility] stated Xopnex went through without a charge and that they will have patients medications ready tomorrow nothing further needed.

## 2021-06-05 ENCOUNTER — Telehealth: Payer: Self-pay | Admitting: Pulmonary Disease

## 2021-06-06 MED ORDER — LEVALBUTEROL HCL 1.25 MG/0.5ML IN NEBU: 1.2500 mg | INHALATION_SOLUTION | Freq: Four times a day (QID) | RESPIRATORY_TRACT | 2 refills | Status: DC | PRN

## 2021-06-06 NOTE — Telephone Encounter (Signed)
[facility] number provided with Ana-received recording that number is not in service. Spoke to patient, who stated that she has not picked up xopenex Rx because the pharmacy has additional questions.  Called local [facility] and spoke to [place]. Rachael stated that levalbterol is not covered under part D. She stated that she try to file under part B, however she will have to contact patient to obtain medicare part B information. Rx re sent with dx code.

## 2021-06-06 NOTE — Telephone Encounter (Signed)
Spoke to patient, who stated that she spoke to [facility] and provided part B information. Rx has been filed with 2 dollar co pay.  Nothing further needed at this time.

## 2021-06-12 ENCOUNTER — Other Ambulatory Visit: Payer: Self-pay | Admitting: Internal Medicine

## 2021-06-12 NOTE — Telephone Encounter (Signed)
Requested medications are due for refill today.  yes  Requested medications are on the active medications list.  yes  Last refill. 05/18/2021  Future visit scheduled.   yes  Notes to clinic.  Medication not delegated.    Requested Prescriptions  Pending Prescriptions Disp Refills   promethazine (PHENERGAN) 25 MG tablet [Pharmacy Med Name: PROMETHAZINE 25 MG TABLET] 30 tablet 1    Sig: TAKE 1 TABLET BY MOUTH EVERY 6 HOURS AS NEEDED FOR NAUSEA OR VOMITING.     Not Delegated - Gastroenterology: Antiemetics Failed - 06/12/2021  1:00 AM      Failed - This refill cannot be delegated      Passed - Valid encounter within last 6 months    Recent Outpatient Visits           3 months ago Essential hypertension   [facility] Glean Hess, MD   8 months ago Asthma in adult, moderate persistent, with acute exacerbation   [facility] Glean Hess, MD   1 year ago Annual physical exam   [facility] Glean Hess, MD   2 years ago Annual physical exam   [facility] Glean Hess, MD   2 years ago Essential hypertension   [facility] Glean Hess, MD       Future Appointments             In 2 weeks Sharolyn Douglas Clance Boll, NP [facility], [facility]   In 8 months Army Melia, Jesse Sans, MD [facility], [facility]

## 2021-06-26 ENCOUNTER — Telehealth: Payer: Self-pay | Admitting: Pulmonary Disease

## 2021-06-26 ENCOUNTER — Other Ambulatory Visit: Payer: Self-pay | Admitting: Internal Medicine

## 2021-06-26 MED ORDER — BUDESONIDE 0.5 MG/2ML IN SUSP: 0.5000 mg | Freq: Two times a day (BID) | RESPIRATORY_TRACT | 2 refills | Status: DC

## 2021-06-26 MED ORDER — IPRATROPIUM-ALBUTEROL 0.5-2.5 (3) MG/3ML IN SOLN: 3.0000 mL | Freq: Four times a day (QID) | RESPIRATORY_TRACT | 2 refills | Status: DC | PRN

## 2021-06-26 NOTE — Telephone Encounter (Signed)
Spoke to patient.  She would like to switch from Xopenex back to [employer]. She felt that Duoneb was more effective.  She currently has a flare and is having to do a breathing treatments Q3H.  C/o increased sob, dry cough, chest tightness, slight crackle and bilateral feet edema x1w.  Denied f/c/s or additional sx. 2L cont. Spo2 maintaining 90-95%. Fully vaccinated against covid and flu.  No recent covid or flu test.   Dr. Patsey Berthold, please advise.

## 2021-06-26 NOTE — Telephone Encounter (Signed)
DuoNeb will be duplication with the Trelegy.  However given that she has a flare currently I recommend she come to the ED given that she has bilateral edema and it could be more related to fluid.

## 2021-06-26 NOTE — Telephone Encounter (Signed)
Patient is aware of recommendations and voiced her understanding.  Patient would like to stop trelegy and take duoneb only. Per Dr. Lenell Antu can stop trelegy and do duobeb QID,Pulmicort BID and xopenex PRN.  Duoneb and Pulmicort 0.5 sent to preferred pharmacy. Patient is aware and voiced her understanding.  Nothing further needed at this time.

## 2021-06-27 ENCOUNTER — Telehealth: Payer: Self-pay

## 2021-06-27 ENCOUNTER — Other Ambulatory Visit: Payer: Self-pay | Admitting: Internal Medicine

## 2021-06-27 NOTE — Telephone Encounter (Signed)
Called and spoke to patient who states she does not currently use her cpap machine, she is taking it to DME company to get her machine looked at.,

## 2021-06-27 NOTE — Telephone Encounter (Signed)
Requested Prescriptions  Pending Prescriptions Disp Refills   rOPINIRole (REQUIP) 1 MG tablet [Pharmacy Med Name: ROPINIROLE HCL 1 MG TABLET] 90 tablet 0    Sig: TAKE 1 TABLET BY MOUTH THREE TIMES A DAY AS NEEDED     Neurology:  Parkinsonian Agents Passed - 06/27/2021  9:31 AM      Passed - Last BP in normal range    BP Readings from Last 1 Encounters:  05/31/21 138/88         Passed - Last Heart Rate in normal range    Pulse Readings from Last 1 Encounters:  05/31/21 (!) 104         Passed - Valid encounter within last 12 months    Recent Outpatient Visits          4 months ago Essential hypertension   [facility] Glean Hess, MD   8 months ago Asthma in adult, moderate persistent, with acute exacerbation   [facility] Glean Hess, MD   1 year ago Annual physical exam   [facility] Glean Hess, MD   2 years ago Annual physical exam   [facility] Glean Hess, MD   2 years ago Essential hypertension   [place], [place]      Future Appointments            Tomorrow Theora Gianotti, NP [facility], [facility]   In 7 months Army Melia, Jesse Sans, MD [facility], [facility]

## 2021-06-28 ENCOUNTER — Ambulatory Visit: Payer: Medicare Other | Admitting: Nurse Practitioner

## 2021-06-28 NOTE — Progress Notes (Deleted)
Office Visit    Patient Name: Samantha Mason Date of Encounter: 06/28/2021  Primary Care Provider:  Glean Hess, MD Primary Cardiologist:  Nelva Bush, MD  Chief Complaint    71 year old female with a history of chronic diastolic heart failure, aortic atherosclerosis, hypertension, CVA, OSA on CPAP, COPD, GERD, restless leg syndrome, anemia, and obesity who presents for follow-up related to heart failure.  Past Medical History    Past Medical History:  Diagnosis Date   Acute bronchitis    Anemia    Arthritis    Asthma    B12 deficiency 03/14/2017   Chest pain 07/02/2018   COPD (chronic obstructive pulmonary disease) ([place]) 07/24/2018   Family history of blood clots 07/16/2017   Irritable bowel syndrome    Malignant hypertension    Obesity    OSA (obstructive sleep apnea)    uses cpap   Pneumonia 2017   Restless leg syndrome    Stroke ([place]) 1995   mild   Past Surgical History:  Procedure Laterality Date   CHOLECYSTECTOMY     COLONOSCOPY WITH PROPOFOL N/A 03/31/2018   Procedure: COLONOSCOPY WITH PROPOFOL;  Surgeon: Lucilla Lame, MD;  Location: [facility] ENDOSCOPY;  Service: Endoscopy;  Laterality: N/A;   KNEE ARTHROPLASTY Right 04/30/2019   Procedure: COMPUTER ASSISTED TOTAL KNEE ARTHROPLASTY;  Surgeon: Dereck Leep, MD;  Location: [facility] ORS;  Service: Orthopedics;  Laterality: Right;   saliva gland removal  1998   TOTAL KNEE ARTHROPLASTY Left 2014   TUBAL LIGATION Bilateral     Allergies  Allergies  Allergen Reactions   Nuvigil [Armodafinil] Hives   Penicillins Diarrhea and Nausea And Vomiting    Did it involve swelling of the face/tongue/throat, SOB, or low BP? no Did it involve sudden or severe rash/hives, skin peeling, or any reaction on the inside of your mouth or nose? No Did you need to seek medical attention at a hospital or doctor's office? No When did it last happen?  in her 24s    If all above answers are NO, may proceed with  cephalosporin use.    Gabapentin Other (See Comments)    "wiped her out, couldn't stay awake"   Provigil [Modafinil] Hives    History of Present Illness    71 year old female with the above past medical history including chronic diastolic heart failure, aortic atherosclerosis, hypertension, CVA, OSA on CPAP, COPD, GERD, restless leg syndrome, anemia, and obesity.  Stress test in 2020 in the setting of chest pain, shortness of breath was low risk, EF 66%. Echocardiogram in April 2022 showed EF 55 to 60%, normal LV function, grade 1 diastolic dysfunction. Myocardial PET/[facility] in October 2022 showed no evidence of ischemia, EF 60 %.  She last saw Dr. Saunders Revel in 03/2021 and was restarted on diltiazem in the setting of resting tachycardia, mild hypertension.  Additionally, she follows with pulmonology for OSA, COPD. She last saw pulmonology in January 2023 reported ongoing shortness of breath, likely multifactorial in the setting of chronic diastolic heart failure, extreme obesity and poorly compensated persistent asthma.   She presents today for follow-up. Since her last visit  Chronic diastolic heart failure: Hypertension: OSA: COPD: Obesity: Disposition:   Home Medications    Current Outpatient Medications  Medication Sig Dispense Refill   acetaminophen (TYLENOL) 500 MG tablet Take 1,000 mg by mouth every 6 (six) hours as needed.     benzonatate (TESSALON) 100 MG capsule Take 1 capsule (100 mg total) by mouth 3 (three)  times daily as needed for cough. 20 capsule 1   budesonide (PULMICORT) 0.5 MG/2ML nebulizer solution Take 2 mLs (0.5 mg total) by nebulization 2 (two) times daily. 120 mL 2   cetirizine (ZYRTEC) 10 MG tablet Take 1 tablet (10 mg total) by mouth daily. 30 tablet 2   cholestyramine (QUESTRAN) 4 g packet DISSOLVE AND DRINK 1 PACKET BY MOUTH AT BEDTIME 30 packet 11   diltiazem (CARDIZEM CD) 180 MG 24 hr capsule Take 1 capsule (180 mg total) by mouth daily. 90 capsule 1    furosemide (LASIX) 20 MG tablet Take 1 tablet (20 mg total) by mouth daily as needed (swelling or wt gain). 90 tablet 3   ipratropium-albuterol (DUONEB) 0.5-2.5 (3) MG/3ML SOLN Take 3 mLs by nebulization every 6 (six) hours as needed. 360 mL 2   levalbuterol (XOPENEX) 1.25 MG/0.5ML nebulizer solution Take 1.25 mg by nebulization every 6 (six) hours as needed for wheezing or shortness of breath. 1 each 2   olmesartan (BENICAR) 5 MG tablet Take 1 tablet (5 mg total) by mouth daily. 30 tablet 0   pantoprazole (PROTONIX) 40 MG tablet TAKE 1 TABLET BY MOUTH DAILY 90 tablet 1   PREVALITE 4 g packet Take 1 packet by mouth at bedtime.     promethazine (PHENERGAN) 25 MG tablet TAKE 1 TABLET BY MOUTH EVERY 6 HOURS AS NEEDED FOR NAUSEA OR VOMITING. 30 tablet 1   rOPINIRole (REQUIP) 1 MG tablet TAKE 1 TABLET BY MOUTH THREE TIMES A DAY AS NEEDED 90 tablet 0   No current facility-administered medications for this visit.     Review of Systems    ***.  All other systems reviewed and are otherwise negative except as noted above.    Physical Exam    VS:  There were no vitals taken for this visit. , BMI There is no height or weight on file to calculate BMI.     GEN: Well nourished, well developed, in no acute distress. HEENT: normal. Neck: Supple, no JVD, carotid bruits, or masses. Cardiac: RRR, no murmurs, rubs, or gallops. No clubbing, cyanosis, edema.  Radials/DP/PT 2+ and equal bilaterally.  Respiratory:  Respirations regular and unlabored, clear to auscultation bilaterally. GI: Soft, nontender, nondistended, BS + x 4. MS: no deformity or atrophy. Skin: warm and dry, no rash. Neuro:  Strength and sensation are intact. Psych: Normal affect.  Accessory Clinical Findings    ECG personally reviewed by me today - *** - no acute changes.  Lab Results  Component Value Date   WBC 10.0 03/10/2021   HGB 10.3 (L) 03/10/2021   HCT 32.4 (L) 03/10/2021   MCV 78.3 (L) 03/10/2021   PLT 323 03/10/2021    Lab Results  Component Value Date   CREATININE 1.36 (H) 03/10/2021   BUN 31 (H) 03/10/2021   NA 134 (L) 03/10/2021   K 4.2 03/10/2021   CL 94 (L) 03/10/2021   CO2 34 (H) 03/10/2021   Lab Results  Component Value Date   ALT 10 03/05/2021   AST 16 03/05/2021   ALKPHOS 71 03/05/2021   BILITOT 0.6 03/05/2021   Lab Results  Component Value Date   CHOL 168 02/07/2021   HDL 59 02/07/2021   LDLCALC 92 02/07/2021   TRIG 87 02/07/2021   CHOLHDL 2.8 02/07/2021    Lab Results  Component Value Date   HGBA1C 6.2 (H) 02/19/2021    Assessment & Plan    1.  ***   Lenna Sciara, NP 06/28/2021,  9:17 AM

## 2021-06-29 ENCOUNTER — Ambulatory Visit: Payer: Medicare Other | Admitting: Adult Health

## 2021-07-15 ENCOUNTER — Encounter: Payer: Self-pay | Admitting: Internal Medicine

## 2021-07-15 ENCOUNTER — Emergency Department: Payer: Medicare Other

## 2021-07-15 ENCOUNTER — Other Ambulatory Visit: Payer: Self-pay

## 2021-07-15 ENCOUNTER — Inpatient Hospital Stay
Admission: EM | Admit: 2021-07-15 | Discharge: 2021-07-27 | DRG: 291 | Disposition: A | Discharge status: Home Health | Payer: Medicare Other | Attending: Internal Medicine | Admitting: Internal Medicine

## 2021-07-15 DIAGNOSIS — R0602 Shortness of breath: Secondary | ICD-10-CM

## 2021-07-15 DIAGNOSIS — I5033 Acute on chronic diastolic (congestive) heart failure: Secondary | ICD-10-CM | POA: Diagnosis present

## 2021-07-15 DIAGNOSIS — J9601 Acute respiratory failure with hypoxia: Secondary | ICD-10-CM

## 2021-07-15 DIAGNOSIS — J45901 Unspecified asthma with (acute) exacerbation: Secondary | ICD-10-CM | POA: Diagnosis present

## 2021-07-15 DIAGNOSIS — R778 Other specified abnormalities of plasma proteins: Secondary | ICD-10-CM

## 2021-07-15 DIAGNOSIS — G4733 Obstructive sleep apnea (adult) (pediatric): Secondary | ICD-10-CM | POA: Diagnosis present

## 2021-07-15 DIAGNOSIS — Z8673 Personal history of transient ischemic attack (TIA), and cerebral infarction without residual deficits: Secondary | ICD-10-CM | POA: Diagnosis not present

## 2021-07-15 DIAGNOSIS — J9621 Acute and chronic respiratory failure with hypoxia: Secondary | ICD-10-CM | POA: Diagnosis present

## 2021-07-15 DIAGNOSIS — N189 Chronic kidney disease, unspecified: Secondary | ICD-10-CM | POA: Diagnosis not present

## 2021-07-15 DIAGNOSIS — I248 Other forms of acute ischemic heart disease: Secondary | ICD-10-CM | POA: Diagnosis present

## 2021-07-15 DIAGNOSIS — Z9981 Dependence on supplemental oxygen: Secondary | ICD-10-CM | POA: Diagnosis not present

## 2021-07-15 DIAGNOSIS — D631 Anemia in chronic kidney disease: Secondary | ICD-10-CM | POA: Diagnosis present

## 2021-07-15 DIAGNOSIS — Z96653 Presence of artificial knee joint, bilateral: Secondary | ICD-10-CM | POA: Diagnosis present

## 2021-07-15 DIAGNOSIS — I5A Non-ischemic myocardial injury (non-traumatic): Secondary | ICD-10-CM | POA: Diagnosis present

## 2021-07-15 DIAGNOSIS — G4700 Insomnia, unspecified: Secondary | ICD-10-CM | POA: Diagnosis present

## 2021-07-15 DIAGNOSIS — Z9989 Dependence on other enabling machines and devices: Secondary | ICD-10-CM | POA: Diagnosis not present

## 2021-07-15 DIAGNOSIS — D509 Iron deficiency anemia, unspecified: Secondary | ICD-10-CM | POA: Diagnosis present

## 2021-07-15 DIAGNOSIS — G2581 Restless legs syndrome: Secondary | ICD-10-CM | POA: Diagnosis present

## 2021-07-15 DIAGNOSIS — J9602 Acute respiratory failure with hypercapnia: Secondary | ICD-10-CM

## 2021-07-15 DIAGNOSIS — I161 Hypertensive emergency: Secondary | ICD-10-CM | POA: Diagnosis present

## 2021-07-15 DIAGNOSIS — J4489 Other specified chronic obstructive pulmonary disease: Secondary | ICD-10-CM | POA: Diagnosis present

## 2021-07-15 DIAGNOSIS — Z79899 Other long term (current) drug therapy: Secondary | ICD-10-CM

## 2021-07-15 DIAGNOSIS — I130 Hypertensive heart and chronic kidney disease with heart failure and stage 1 through stage 4 chronic kidney disease, or unspecified chronic kidney disease: Principal | ICD-10-CM | POA: Diagnosis present

## 2021-07-15 DIAGNOSIS — N179 Acute kidney failure, unspecified: Secondary | ICD-10-CM | POA: Diagnosis present

## 2021-07-15 DIAGNOSIS — R110 Nausea: Secondary | ICD-10-CM | POA: Diagnosis present

## 2021-07-15 DIAGNOSIS — J449 Chronic obstructive pulmonary disease, unspecified: Secondary | ICD-10-CM | POA: Diagnosis present

## 2021-07-15 DIAGNOSIS — E871 Hypo-osmolality and hyponatremia: Secondary | ICD-10-CM | POA: Diagnosis present

## 2021-07-15 DIAGNOSIS — Z888 Allergy status to other drugs, medicaments and biological substances status: Secondary | ICD-10-CM | POA: Diagnosis not present

## 2021-07-15 DIAGNOSIS — J441 Chronic obstructive pulmonary disease with (acute) exacerbation: Secondary | ICD-10-CM | POA: Diagnosis present

## 2021-07-15 DIAGNOSIS — Z880 Allergy status to penicillin: Secondary | ICD-10-CM

## 2021-07-15 DIAGNOSIS — I10 Essential (primary) hypertension: Secondary | ICD-10-CM | POA: Diagnosis not present

## 2021-07-15 DIAGNOSIS — Z833 Family history of diabetes mellitus: Secondary | ICD-10-CM

## 2021-07-15 DIAGNOSIS — Z6841 Body Mass Index (BMI) 40.0 and over, adult: Secondary | ICD-10-CM

## 2021-07-15 DIAGNOSIS — J4551 Severe persistent asthma with (acute) exacerbation: Secondary | ICD-10-CM

## 2021-07-15 DIAGNOSIS — Z823 Family history of stroke: Secondary | ICD-10-CM

## 2021-07-15 DIAGNOSIS — R7303 Prediabetes: Secondary | ICD-10-CM | POA: Diagnosis present

## 2021-07-15 DIAGNOSIS — I5032 Chronic diastolic (congestive) heart failure: Secondary | ICD-10-CM | POA: Insufficient documentation

## 2021-07-15 DIAGNOSIS — N1831 Chronic kidney disease, stage 3a: Secondary | ICD-10-CM | POA: Diagnosis present

## 2021-07-15 DIAGNOSIS — R0600 Dyspnea, unspecified: Secondary | ICD-10-CM

## 2021-07-15 DIAGNOSIS — I5031 Acute diastolic (congestive) heart failure: Secondary | ICD-10-CM | POA: Diagnosis not present

## 2021-07-15 DIAGNOSIS — J9622 Acute and chronic respiratory failure with hypercapnia: Secondary | ICD-10-CM | POA: Diagnosis present

## 2021-07-15 DIAGNOSIS — Z20822 Contact with and (suspected) exposure to covid-19: Secondary | ICD-10-CM | POA: Diagnosis present

## 2021-07-15 DIAGNOSIS — K589 Irritable bowel syndrome without diarrhea: Secondary | ICD-10-CM | POA: Diagnosis present

## 2021-07-15 DIAGNOSIS — R609 Edema, unspecified: Secondary | ICD-10-CM

## 2021-07-15 DIAGNOSIS — Z8249 Family history of ischemic heart disease and other diseases of the circulatory system: Secondary | ICD-10-CM

## 2021-07-15 DIAGNOSIS — Z825 Family history of asthma and other chronic lower respiratory diseases: Secondary | ICD-10-CM

## 2021-07-15 DIAGNOSIS — Z7951 Long term (current) use of inhaled steroids: Secondary | ICD-10-CM

## 2021-07-15 LAB — BASIC METABOLIC PANEL
Anion gap: 9 (ref 5–15)
BUN: 16 mg/dL (ref 8–23)
CO2: 33 mmol/L — ABNORMAL HIGH (ref 22–32)
Calcium: 9.3 mg/dL (ref 8.9–10.3)
Chloride: 96 mmol/L — ABNORMAL LOW (ref 98–111)
Creatinine, Ser: 1.19 mg/dL — ABNORMAL HIGH (ref 0.44–1.00)
GFR, Estimated: 49 mL/min — ABNORMAL LOW (ref >60)
Glucose, Bld: 103 mg/dL — ABNORMAL HIGH (ref 70–99)
Potassium: 4.2 mmol/L (ref 3.5–5.1)
Sodium: 138 mmol/L (ref 135–145)

## 2021-07-15 LAB — BLOOD GAS, ARTERIAL
Acid-Base Excess: 7.8 mmol/L — ABNORMAL HIGH (ref 0.0–2.0)
Acid-Base Excess: 11.5 mmol/L — ABNORMAL HIGH (ref 0.0–2.0)
Bicarbonate: 36.1 mmol/L — ABNORMAL HIGH (ref 20.0–28.0)
Bicarbonate: 38.7 mmol/L — ABNORMAL HIGH (ref 20.0–28.0)
Delivery systems: POSITIVE
Delivery systems: POSITIVE
Expiratory PAP: 8 cmH2O
Expiratory PAP: 8 cmH2O
FIO2: 60 %
FIO2: 35 %
Inspiratory PAP: 18 cmH2O
Inspiratory PAP: 16 cmH2O
O2 Saturation: 100 %
O2 Saturation: 100 %
Patient temperature: 37
Patient temperature: 37
pCO2 arterial: 67 mmHg (ref 32–48)
pCO2 arterial: 61 mmHg — ABNORMAL HIGH (ref 32–48)
pH, Arterial: 7.34 — ABNORMAL LOW (ref 7.35–7.45)
pH, Arterial: 7.41 (ref 7.35–7.45)
pO2, Arterial: 225 mmHg — ABNORMAL HIGH (ref 83–108)
pO2, Arterial: 112 mmHg — ABNORMAL HIGH (ref 83–108)

## 2021-07-15 LAB — RESP PANEL BY RT-PCR (FLU A&B, COVID) ARPGX2
Influenza A by PCR: NEGATIVE
Influenza B by PCR: NEGATIVE
SARS Coronavirus 2 by RT PCR: NEGATIVE

## 2021-07-15 LAB — RESPIRATORY PANEL BY PCR

## 2021-07-15 LAB — CBC WITH DIFFERENTIAL/PLATELET
Abs Immature Granulocytes: 0.05 10*3/uL (ref 0.00–0.07)
Basophils Absolute: 0.1 10*3/uL (ref 0.0–0.1)
Basophils Relative: 1 %
Eosinophils Absolute: 0.5 10*3/uL (ref 0.0–0.5)
Eosinophils Relative: 5 %
HCT: 33.2 % — ABNORMAL LOW (ref 36.0–46.0)
Hemoglobin: 9.7 g/dL — ABNORMAL LOW (ref 12.0–15.0)
Immature Granulocytes: 1 %
Lymphocytes Relative: 15 %
Lymphs Abs: 1.5 10*3/uL (ref 0.7–4.0)
MCH: 22.8 pg — ABNORMAL LOW (ref 26.0–34.0)
MCHC: 29.2 g/dL — ABNORMAL LOW (ref 30.0–36.0)
MCV: 78.1 fL — ABNORMAL LOW (ref 80.0–100.0)
Monocytes Absolute: 0.6 10*3/uL (ref 0.1–1.0)
Monocytes Relative: 7 %
Neutro Abs: 7.1 10*3/uL (ref 1.7–7.7)
Neutrophils Relative %: 71 %
Platelets: 392 10*3/uL (ref 150–400)
RBC: 4.25 MIL/uL (ref 3.87–5.11)
RDW: 15.9 % — ABNORMAL HIGH (ref 11.5–15.5)
WBC: 9.8 10*3/uL (ref 4.0–10.5)
nRBC: 0 % (ref 0.0–0.2)

## 2021-07-15 LAB — HEMOGLOBIN A1C
Hgb A1c MFr Bld: 5.4 % (ref 4.8–5.6)
Mean Plasma Glucose: 108.28 mg/dL

## 2021-07-15 LAB — BLOOD GAS, VENOUS
Acid-Base Excess: 6.5 mmol/L — ABNORMAL HIGH (ref 0.0–2.0)
Bicarbonate: 33.9 mmol/L — ABNORMAL HIGH (ref 20.0–28.0)
Delivery systems: POSITIVE
FIO2: 50 %
O2 Saturation: 91.8 %
Patient temperature: 37
pCO2, Ven: 60 mmHg (ref 44–60)
pH, Ven: 7.36 (ref 7.25–7.43)
pO2, Ven: 59 mmHg — ABNORMAL HIGH (ref 32–45)

## 2021-07-15 LAB — BRAIN NATRIURETIC PEPTIDE: B Natriuretic Peptide: 103.2 pg/mL — ABNORMAL HIGH (ref 0.0–100.0)

## 2021-07-15 LAB — TROPONIN I (HIGH SENSITIVITY)
Troponin I (High Sensitivity): 15 ng/L (ref <18)
Troponin I (High Sensitivity): 21 ng/L — ABNORMAL HIGH (ref <18)
Troponin I (High Sensitivity): 34 ng/L — ABNORMAL HIGH (ref <18)
Troponin I (High Sensitivity): 34 ng/L — ABNORMAL HIGH (ref <18)

## 2021-07-15 MED ORDER — LORATADINE 10 MG PO TABS
10.0000 mg | ORAL_TABLET | Freq: Every day | ORAL | Status: DC
  Administered 2021-07-15 – 2021-07-27 (×13): 10 mg via ORAL
  Filled 2021-07-15 (×13): qty 1

## 2021-07-15 MED ORDER — ONDANSETRON HCL 4 MG/2ML IJ SOLN
4.0000 mg | Freq: Three times a day (TID) | INTRAMUSCULAR | Status: DC | PRN
  Administered 2021-07-19 – 2021-07-26 (×7): 4 mg via INTRAVENOUS
  Filled 2021-07-15 (×7): qty 2

## 2021-07-15 MED ORDER — MAGNESIUM SULFATE 2 GM/50ML IV SOLN
2.0000 g | Freq: Once | INTRAVENOUS | Status: AC
  Administered 2021-07-15: 2 g via INTRAVENOUS
  Filled 2021-07-15: qty 50

## 2021-07-15 MED ORDER — FUROSEMIDE 10 MG/ML IJ SOLN
INTRAMUSCULAR | Status: AC
  Administered 2021-07-15: 40 mg
  Filled 2021-07-15: qty 4

## 2021-07-15 MED ORDER — METHYLPREDNISOLONE SODIUM SUCC 125 MG IJ SOLR
80.0000 mg | Freq: Two times a day (BID) | INTRAMUSCULAR | Status: DC
Start: 2021-07-15 — End: 2021-07-16
  Administered 2021-07-15 – 2021-07-16 (×2): 80 mg via INTRAVENOUS
  Filled 2021-07-15 (×2): qty 2

## 2021-07-15 MED ORDER — HYDRALAZINE HCL 20 MG/ML IJ SOLN: 5.0000 mg | INTRAMUSCULAR | Status: DC | PRN

## 2021-07-15 MED ORDER — HYDRALAZINE HCL 20 MG/ML IJ SOLN
10.0000 mg | Freq: Once | INTRAMUSCULAR | Status: AC
Start: 2021-07-15 — End: 2021-07-15
  Administered 2021-07-15: 10 mg via INTRAVENOUS
  Filled 2021-07-15: qty 1

## 2021-07-15 MED ORDER — NITROGLYCERIN 2 % TD OINT
1.0000 [in_us] | TOPICAL_OINTMENT | Freq: Once | TRANSDERMAL | Status: AC
  Administered 2021-07-15: 1 [in_us] via TOPICAL

## 2021-07-15 MED ORDER — ENOXAPARIN SODIUM 100 MG/ML IJ SOSY
0.5000 mg/kg | PREFILLED_SYRINGE | INTRAMUSCULAR | Status: DC
  Administered 2021-07-16 – 2021-07-21 (×6): 85 mg via SUBCUTANEOUS
  Filled 2021-07-15 (×8): qty 0.85

## 2021-07-15 MED ORDER — HYDRALAZINE HCL 20 MG/ML IJ SOLN
10.0000 mg | INTRAMUSCULAR | Status: DC | PRN
  Administered 2021-07-16 (×2): 10 mg via INTRAVENOUS
  Filled 2021-07-15 (×2): qty 1

## 2021-07-15 MED ORDER — ALBUTEROL SULFATE (2.5 MG/3ML) 0.083% IN NEBU
15.0000 mg | INHALATION_SOLUTION | Freq: Once | RESPIRATORY_TRACT | Status: AC
  Administered 2021-07-15: 15 mg via RESPIRATORY_TRACT
  Filled 2021-07-15 (×2): qty 18

## 2021-07-15 MED ORDER — PANTOPRAZOLE SODIUM 40 MG PO TBEC
40.0000 mg | DELAYED_RELEASE_TABLET | Freq: Every day | ORAL | Status: DC
  Administered 2021-07-15 – 2021-07-24 (×10): 40 mg via ORAL
  Filled 2021-07-15 (×10): qty 1

## 2021-07-15 MED ORDER — METHYLPREDNISOLONE SODIUM SUCC 1000 MG IJ SOLR
160.0000 mg | Freq: Every day | INTRAMUSCULAR | Status: DC
Start: 2021-07-15 — End: 2021-07-15
  Filled 2021-07-15: qty 2.56

## 2021-07-15 MED ORDER — LABETALOL HCL 5 MG/ML IV SOLN
10.0000 mg | Freq: Once | INTRAVENOUS | Status: AC
  Administered 2021-07-15: 10 mg via INTRAVENOUS
  Filled 2021-07-15: qty 4

## 2021-07-15 MED ORDER — ALBUTEROL SULFATE (2.5 MG/3ML) 0.083% IN NEBU
2.5000 mg | INHALATION_SOLUTION | RESPIRATORY_TRACT | Status: DC | PRN
  Administered 2021-07-15 – 2021-07-27 (×14): 2.5 mg via RESPIRATORY_TRACT
  Filled 2021-07-15 (×14): qty 3

## 2021-07-15 MED ORDER — ACETAMINOPHEN 325 MG PO TABS
650.0000 mg | ORAL_TABLET | Freq: Four times a day (QID) | ORAL | Status: DC | PRN
  Administered 2021-07-15 – 2021-07-22 (×2): 650 mg via ORAL
  Filled 2021-07-15 (×2): qty 2

## 2021-07-15 MED ORDER — ROPINIROLE HCL 1 MG PO TABS
1.0000 mg | ORAL_TABLET | Freq: Three times a day (TID) | ORAL | Status: DC | PRN
  Administered 2021-07-15 – 2021-07-18 (×7): 1 mg via ORAL
  Filled 2021-07-15 (×9): qty 1

## 2021-07-15 MED ORDER — IPRATROPIUM-ALBUTEROL 0.5-2.5 (3) MG/3ML IN SOLN
3.0000 mL | RESPIRATORY_TRACT | Status: DC
  Administered 2021-07-15 – 2021-07-19 (×23): 3 mL via RESPIRATORY_TRACT
  Filled 2021-07-15 (×22): qty 3

## 2021-07-15 MED ORDER — CHOLESTYRAMINE 4 G PO PACK
4.0000 g | PACK | Freq: Every day | ORAL | Status: DC
  Administered 2021-07-15 – 2021-07-26 (×12): 4 g via ORAL
  Filled 2021-07-15 (×13): qty 1

## 2021-07-15 MED ORDER — FUROSEMIDE 10 MG/ML IJ SOLN
40.0000 mg | Freq: Two times a day (BID) | INTRAMUSCULAR | Status: DC
  Administered 2021-07-15 – 2021-07-16 (×3): 40 mg via INTRAVENOUS
  Filled 2021-07-15 (×3): qty 4

## 2021-07-15 MED ORDER — DM-GUAIFENESIN ER 30-600 MG PO TB12
1.0000 | ORAL_TABLET | Freq: Two times a day (BID) | ORAL | Status: DC | PRN
Start: 2021-07-15 — End: 2021-07-27
  Administered 2021-07-16 – 2021-07-27 (×19): 1 via ORAL
  Filled 2021-07-15 (×20): qty 1

## 2021-07-15 MED ORDER — FUROSEMIDE 10 MG/ML IJ SOLN
40.0000 mg | Freq: Once | INTRAMUSCULAR | Status: AC
  Administered 2021-07-15: 40 mg via INTRAVENOUS
  Filled 2021-07-15: qty 4

## 2021-07-15 MED ORDER — LOPERAMIDE HCL 2 MG PO CAPS
2.0000 mg | ORAL_CAPSULE | ORAL | Status: DC | PRN
  Administered 2021-07-15 – 2021-07-24 (×5): 2 mg via ORAL
  Filled 2021-07-15 (×5): qty 1

## 2021-07-15 MED ORDER — ASPIRIN EC 81 MG PO TBEC
81.0000 mg | DELAYED_RELEASE_TABLET | Freq: Every day | ORAL | Status: DC
  Administered 2021-07-15 – 2021-07-23 (×2): 81 mg via ORAL
  Filled 2021-07-15 (×12): qty 1

## 2021-07-15 MED ORDER — ACETAMINOPHEN 650 MG RE SUPP: 650.0000 mg | Freq: Four times a day (QID) | RECTAL | Status: DC | PRN

## 2021-07-15 MED ORDER — DILTIAZEM HCL ER COATED BEADS 180 MG PO CP24
180.0000 mg | ORAL_CAPSULE | Freq: Every day | ORAL | Status: DC
  Administered 2021-07-16 – 2021-07-27 (×12): 180 mg via ORAL
  Filled 2021-07-15 (×13): qty 1

## 2021-07-15 MED ORDER — HYDRALAZINE HCL 20 MG/ML IJ SOLN
10.0000 mg | Freq: Once | INTRAMUSCULAR | Status: DC
  Filled 2021-07-15: qty 1

## 2021-07-15 NOTE — ED Triage Notes (Signed)
Pt arrives in resp distress on cpap. Pt with pitting edema noted to bilateral lower extremities, pt very anxious. Skin warm and dry. Md at bedside.

## 2021-07-15 NOTE — H&P (Signed)
History and Physical    Samantha Mason JJK:[id number] DOB: 02/28/1951 DOA: 07/15/2021  Referring MD/NP/PA:   PCP: Glean Hess, MD   Patient coming from:  The patient is coming from home.  At baseline, pt is independent for most of ADL.        Chief Complaint: SOB  HPI: Samantha Mason is a 71 y.o. female with medical history significant of hypertension, dCHF, COPD on 2 L oxygen, prediabetes, asthma, stroke, GERD, RLS, OSA on CPAP, IBS, anemia, morbid obesity with BMI 63.95, CKD 3, who presents with shortness breath.  Patient states that her shortness breath has been progressively worsening since yesterday.  She has dry cough, no chest pain.  No fever or chills.  Patient was found to have severe respiratory distress and cannot speak in full sentence, currently speaks few words in ED, BiPAP started in ED.  Patient denies nausea, vomiting, diarrhea or abdominal pain.  No symptoms of UTI.  Data Reviewed and ED Course: pt was found to have negative COVID PCR, WBC 9.8, troponin level 15, 21, BNP 103, slightly worsening renal function, temperature normal, blood pressure 231/106, heart rate of 132, 40, oxygen saturation 100% on BiPAP.  ABG with pH 7.34, CO2 67 and O2 225. Chest x-ray showed diffuse interstitial edema.  ABG with pH 7.34, CO2 67, O2 225.  Patient is admitted to PCU inpatient.  EKG: I have personally reviewed.  Sinus rhythm, QTc 436, tachycardia with heart rate of 127, poor R wave progression   Review of Systems:   General: no fevers, chills, no body weight gain, has fatigue HEENT: no blurry vision, hearing changes or sore throat Respiratory: has dyspnea, coughing, wheezing CV: no chest pain, no palpitations GI: no nausea, vomiting, abdominal pain, diarrhea, constipation GU: no dysuria, burning on urination, increased urinary frequency, hematuria  Ext: has leg edema Neuro: no unilateral weakness, numbness, or tingling, no vision change or hearing loss Skin: no rash, no  skin tear. MSK: No muscle spasm, no deformity, no limitation of range of movement in spin Heme: No easy bruising.  Travel history: No recent long distant travel.   Allergy:  Allergies  Allergen Reactions   Nuvigil [Armodafinil] Hives   Penicillins Diarrhea and Nausea And Vomiting    Did it involve swelling of the face/tongue/throat, SOB, or low BP? no Did it involve sudden or severe rash/hives, skin peeling, or any reaction on the inside of your mouth or nose? No Did you need to seek medical attention at a hospital or doctor's office? No When did it last happen?  in her 67s    If all above answers are NO, may proceed with cephalosporin use.    Gabapentin Other (See Comments)    "wiped her out, couldn't stay awake"   Provigil [Modafinil] Hives    Past Medical History:  Diagnosis Date   Acute bronchitis    Anemia    Arthritis    Asthma    B12 deficiency 03/14/2017   Chest pain 07/02/2018   COPD (chronic obstructive pulmonary disease) ([place]) 07/24/2018   Family history of blood clots 07/16/2017   Irritable bowel syndrome    Malignant hypertension    Obesity    OSA (obstructive sleep apnea)    uses cpap   Pneumonia 2017   Restless leg syndrome    Stroke [facility]) 1995   mild    Past Surgical History:  Procedure Laterality Date   CHOLECYSTECTOMY     COLONOSCOPY WITH PROPOFOL N/A 03/31/2018  Procedure: COLONOSCOPY WITH PROPOFOL;  Surgeon: Lucilla Lame, MD;  Location[facility] ENDOSCOPY;  Service: Endoscopy;  Laterality: N/A;   KNEE ARTHROPLASTY Right 04/30/2019   Procedure: COMPUTER ASSISTED TOTAL KNEE ARTHROPLASTY;  Surgeon: Dereck Leep, MD;  Location: [facility] ORS;  Service: Orthopedics;  Laterality: Right;   saliva gland removal  1998   TOTAL KNEE ARTHROPLASTY Left 2014   TUBAL LIGATION Bilateral     Social History:  reports that she has never smoked. She has never used smokeless tobacco. She reports that she does not drink alcohol and does not use drugs.  Family  History:  Family History  Problem Relation Age of Onset   Dementia Mother    COPD Father        8   Diabetes Father    Heart failure Brother    Coronary artery disease Brother 75       CABG   Heart failure Maternal Grandmother    Stroke Maternal Grandmother    Breast cancer Neg Hx      Prior to Admission medications   Medication Sig Start Date End Date Taking? Authorizing Provider  acetaminophen (TYLENOL) 500 MG tablet Take 1,000 mg by mouth every 6 (six) hours as needed.   Yes [provider]  budesonide (PULMICORT) 0.5 MG/2ML nebulizer solution Take 2 mLs (0.5 mg total) by nebulization 2 (two) times daily. 06/26/21 06/26/22 Yes Tyler Pita, MD  cetirizine (ZYRTEC) 10 MG tablet Take 1 tablet (10 mg total) by mouth daily. 12/07/14  Yes Ashok Norris, MD  cholestyramine (QUESTRAN) 4 g packet DISSOLVE AND DRINK 1 PACKET BY MOUTH AT BEDTIME 03/14/21  Yes Glean Hess, MD  diltiazem (CARDIZEM CD) 180 MG 24 hr capsule Take 1 capsule (180 mg total) by mouth daily. 03/28/21 09/24/21 Yes End, Harrell Gave, MD  furosemide (LASIX) 20 MG tablet Take 1 tablet (20 mg total) by mouth daily as needed (swelling or wt gain). 03/10/21  Yes Nicole Kindred A, DO  ipratropium-albuterol (DUONEB) 0.5-2.5 (3) MG/3ML SOLN Take 3 mLs by nebulization every 6 (six) hours as needed. 06/26/21  Yes Tyler Pita, MD  levalbuterol Penne Lash) 1.25 MG/0.5ML nebulizer solution Take 1.25 mg by nebulization every 6 (six) hours as needed for wheezing or shortness of breath. 06/06/21 06/06/22 Yes Tyler Pita, MD  olmesartan (BENICAR) 5 MG tablet Take 1 tablet (5 mg total) by mouth daily. 05/31/21  Yes Tyler Pita, MD  pantoprazole (PROTONIX) 40 MG tablet TAKE 1 TABLET BY MOUTH DAILY 09/26/20 09/26/21 Yes Glean Hess, MD  PREVALITE 4 g packet Take 1 packet by mouth at bedtime. 03/20/21  Yes [provider]  rOPINIRole (REQUIP) 1 MG tablet TAKE 1 TABLET BY MOUTH THREE TIMES A DAY AS  NEEDED 06/27/21  Yes Glean Hess, MD  sodium chloride 0.9 % nebulizer solution as directed. 06/07/21  Yes [provider]  benzonatate (TESSALON) 100 MG capsule Take 1 capsule (100 mg total) by mouth 3 (three) times daily as needed for cough. Patient not taking: Reported on 07/15/2021 05/31/21   Tyler Pita, MD  promethazine (PHENERGAN) 25 MG tablet TAKE 1 TABLET BY MOUTH EVERY 6 HOURS AS NEEDED FOR NAUSEA OR VOMITING. 06/12/21 06/12/22  Glean Hess, MD    Physical Exam: Vitals:   07/15/21 0610 07/15/21 0612 07/15/21 0834 07/15/21 0927  BP:    (!) 165/89  Pulse: (!) 112  95 98  Resp:   (!) 25   Temp:  97.6 F (36.4 C)  TempSrc:  Axillary    SpO2: 100%  99% 98%  Weight:       General: Not in acute distress HEENT:       Eyes: PERRL, EOMI, no scleral icterus.       ENT: No discharge from the ears and nose, no pharynx injection, no tonsillar enlargement.        Neck: Difficult to assess JVD due to morbid obesity, no bruit, no mass felt. Heme: No neck lymph node enlargement. Cardiac: S1/S2, RRR, No murmurs, No gallops or rubs. Respiratory: Has crackles bilaterally, has diffused wheezing bilaterally GI: Soft, nondistended, nontender, no rebound pain, no organomegaly, BS present. GU: No hematuria Ext: 2+ pitting leg edema bilaterally. 1+DP/PT pulse bilaterally. Musculoskeletal: No joint deformities, No joint redness or warmth, no limitation of ROM in spin. Skin: No rashes.  Neuro: Alert, oriented X3, cranial nerves II-XII grossly intact, moves all extremities normally.   Psych: Patient is not psychotic, no suicidal or hemocidal ideation.  Labs on Admission: I have personally reviewed following labs and imaging studies  CBC: Recent Labs  Lab 07/15/21 0436  WBC 9.8  NEUTROABS 7.1  HGB 9.7*  HCT 33.2*  MCV 78.1*  PLT 270   Basic Metabolic Panel: Recent Labs  Lab 07/15/21 0436  NA 138  K 4.2  CL 96*  CO2 33*  GLUCOSE 103*  BUN 16  CREATININE  1.19*  CALCIUM 9.3   GFR: Estimated Creatinine Clearance: 70.7 mL/min (A) (by C-G formula based on SCr of 1.19 mg/dL (H)). Liver Function Tests: No results for input(s): AST, ALT, ALKPHOS, BILITOT, PROT, ALBUMIN in the last 168 hours. No results for input(s): LIPASE, AMYLASE in the last 168 hours. No results for input(s): AMMONIA in the last 168 hours. Coagulation Profile: No results for input(s): INR, PROTIME in the last 168 hours. Cardiac Enzymes: No results for input(s): CKTOTAL, CKMB, CKMBINDEX, TROPONINI in the last 168 hours. BNP (last 3 results) No results for input(s): PROBNP in the last 8760 hours. HbA1C: No results for input(s): HGBA1C in the last 72 hours. CBG: No results for input(s): GLUCAP in the last 168 hours. Lipid Profile: No results for input(s): CHOL, HDL, LDLCALC, TRIG, CHOLHDL, LDLDIRECT in the last 72 hours. Thyroid Function Tests: No results for input(s): TSH, T4TOTAL, FREET4, T3FREE, THYROIDAB in the last 72 hours. Anemia Panel: No results for input(s): VITAMINB12, FOLATE, FERRITIN, TIBC, IRON, RETICCTPCT in the last 72 hours. Urine analysis:    Component Value Date/Time   COLORURINE STRAW (A) 04/19/2019 1314   APPEARANCEUR CLEAR (A) 04/19/2019 1314   APPEARANCEUR Clear 04/27/2012 0958   LABSPEC 1.008 04/19/2019 1314   LABSPEC 1.014 04/27/2012 0958   PHURINE 5.0 04/19/2019 1314   GLUCOSEU NEGATIVE 04/19/2019 1314   GLUCOSEU Negative 04/27/2012 0958   HGBUR SMALL (A) 04/19/2019 1314   BILIRUBINUR neg 02/19/2021 1040   BILIRUBINUR Negative 04/27/2012 0958   KETONESUR NEGATIVE 04/19/2019 1314   PROTEINUR Negative 02/19/2021 1040   PROTEINUR NEGATIVE 04/19/2019 1314   UROBILINOGEN 0.2 02/19/2021 1040   NITRITE neg 02/19/2021 1040   NITRITE NEGATIVE 04/19/2019 1314   LEUKOCYTESUR Negative 02/19/2021 1040   LEUKOCYTESUR NEGATIVE 04/19/2019 1314   LEUKOCYTESUR Negative 04/27/2012 0958   Sepsis  Labs: @LABRCNTIP (procalcitonin:4,lacticidven:4) ) Recent Results (from the past 240 hour(s))  Resp Panel by RT-PCR (Flu A&B, Covid) Nasopharyngeal Swab     Status: None   Collection Time: 07/15/21  4:36 AM   Specimen: Nasopharyngeal Swab; Nasopharyngeal(NP) swabs in vial transport medium  Result Value Ref  Range Status   SARS Coronavirus 2 by RT PCR NEGATIVE NEGATIVE Final    Comment: (NOTE) SARS-CoV-2 target nucleic acids are NOT DETECTED.  The SARS-CoV-2 RNA is generally detectable in upper respiratory specimens during the acute phase of infection. The lowest concentration of SARS-CoV-2 viral copies this assay can detect is 138 copies/mL. A negative result does not preclude SARS-Cov-2 infection and should not be used as the sole basis for treatment or other patient management decisions. A negative result may occur with  improper specimen collection/handling, submission of specimen other than nasopharyngeal swab, presence of viral mutation(s) within the areas targeted by this assay, and inadequate number of viral copies(<138 copies/mL). A negative result must be combined with clinical observations, patient history, and epidemiological information. The expected result is Negative.  Fact Sheet for Patients:  EntrepreneurPulse.com.au  Fact Sheet for Healthcare Providers:  IncredibleEmployment.be  This test is no t yet approved or cleared by the [place] FDA and  has been authorized for detection and/or diagnosis of SARS-CoV-2 by FDA under an Emergency Use Authorization (EUA). This EUA will remain  in effect (meaning this test can be used) for the duration of the COVID-19 declaration under Section 564(b)(1) of the Act, 21 U.S.C.section 360bbb-3(b)(1), unless the authorization is terminated  or revoked sooner.       Influenza A by PCR NEGATIVE NEGATIVE Final   Influenza B by PCR NEGATIVE NEGATIVE Final    Comment: (NOTE) The Xpert Xpress  SARS-CoV-2/FLU/RSV plus assay is intended as an aid in the diagnosis of influenza from Nasopharyngeal swab specimens and should not be used as a sole basis for treatment. Nasal washings and aspirates are unacceptable for Xpert Xpress SARS-CoV-2/FLU/RSV testing.  Fact Sheet for Patients: EntrepreneurPulse.com.au  Fact Sheet for Healthcare Providers: IncredibleEmployment.be  This test is not yet approved or cleared by the [place] FDA and has been authorized for detection and/or diagnosis of SARS-CoV-2 by FDA under an Emergency Use Authorization (EUA). This EUA will remain in effect (meaning this test can be used) for the duration of the COVID-19 declaration under Section 564(b)(1) of the Act, 21 U.S.C. section 360bbb-3(b)(1), unless the authorization is terminated or revoked.  Performed at [facility], [address] [place],  [address]      Radiological Exams on Admission: DG Chest Portable 1 View  Result Date: 07/15/2021 CLINICAL DATA:  Shortness of breath EXAM: PORTABLE CHEST 1 VIEW COMPARISON:  03/08/2021 FINDINGS: Cardiomegaly with diffuse interstitial prominence and cephalized blood flow. Vascular pedicle widening. Mild atelectasis on the right. Elevated right diaphragm, chronic. No visible effusion or pneumothorax. IMPRESSION: CHF pattern. Electronically Signed   By: Jorje Guild M.D.   On: 07/15/2021 04:52      Assessment/Plan Principal Problem:   Acute on chronic respiratory failure with hypoxia (HCC) Active Problems:   Essential hypertension   Hypertensive emergency   OSA on CPAP   Microcytic anemia   History of stroke   Acute on chronic diastolic CHF (congestive heart failure) (HCC)   COPD exacerbation (HCC)   Asthma exacerbation   Stage 3a chronic kidney disease (CKD) ([place])   Morbid obesity with BMI of 60.0-69.9, adult (HCC)   Elevated troponin   Acute on chronic respiratory failure with hypoxia  ([place]): Likely due to combination of cough and COPD exacerbation.  Patient has wheezing on auscultation, indicating COPD exacerbation.  Patient has 2+ bilateral leg edema and diffuse interstitial edema on chest x-ray, clinically consistent with CHF exacerbation.  Patient has severe respiratory distress,  BiPAP started in ED.  -Admitted to PCU as inpatient -Bronchodilators -IV Lasix for CHF exacerbation -Wean off of BiPAP -Nasal cannula oxygen to maintain oxygen saturation above 93% when patient is off BiPAP  Acute on chronic diastolic CHF: 2D echo on 8/85/0277 showed EF 55-60% with grade 1 diastolic dysfunction.  Now patient has CHF exacerbation -Lasix 40 mg bid by IV -2d echo -Daily weights -strict I/O's -Low salt diet -Fluid restriction  COPD exacerbation and asthma exacerbation: -Bronchodilators -Patient received 2 g of magnesium sulfate in ED -Solu-Medrol 80 mg IV bid -Mucinex for cough  -Incentive spirometry -sputum culture -when pt is off BiPAP, will start nasal cannula oxygen as needed to maintain O2 saturation 93% or greater  Essential hypertension and hypertensive emergency: Bp 231/106 --> 154/70 -Patient was given IV hydralazine, IV labetalol and nitroglycerin patch in the ED -Continue as needed IV hydralazine -Restart Cardizem 180 mg daily -Patient is on IV Lasix  OSA on CPAP -Currently on BiPAP  Microcytic anemia: Hemoglobin 9.7 (at 10.3 on 03/10/2021) -Follow-up with CBC  History of stroke -Aspirin  Stage 3a chronic kidney disease (CKD) ([place]): Stable -Follow-up with BMP  Morbid obesity with BMI of 60.0-69.9, adult ([place]): BMI 63.95 -Diet and exercise.   -Encouraged to lose weight.   Elevated troponin: Troponin 15, 21, 34, likely demand ischemia -Check A1c, FLP -Aspirin       DVT ppx:  SQ Lovenox  Code Status: Full code  Family Communication:  Yes, patient's son  at bed side.    Disposition Plan:  Anticipate discharge back to previous  environment  Consults called:  none  Admission status and Level of care: Stepdown:    SDU/inpation         Severity of Illness:  The appropriate patient status for this patient is INPATIENT. Inpatient status is judged to be reasonable and necessary in order to provide the required intensity of service to ensure the patient's safety. The patient's presenting symptoms, physical exam findings, and initial radiographic and laboratory data in the context of their chronic comorbidities is felt to place them at high risk for further clinical deterioration. Furthermore, it is not anticipated that the patient will be medically stable for discharge from the hospital within 2 midnights of admission.   * I certify that at the point of admission it is my clinical judgment that the patient will require inpatient hospital care spanning beyond 2 midnights from the point of admission due to high intensity of service, high risk for further deterioration and high frequency of surveillance required.*       Date of Service 07/15/2021    Ivor Costa [facility]   If 7PM-7AM, please contact night-coverage [email or web address] 07/15/2021, 10:09 AM

## 2021-07-15 NOTE — TOC Initial Note (Signed)
Transition of Care [facility]) - Initial/Assessment Note    Patient Details  Name: Samantha Mason MRN: [id number] Date of Birth: 1950-08-10  Transition of [facility]) CM/SW Contact:    Janyth Contes, [place] Phone Number: 07/15/2021, 3:58 PM  Clinical Narrative:                  Patient admitted due to respiratory distress. Doctor may place order for PT eval if indicated. TOC will continue to follow.       Patient Goals and CMS Choice        Expected Discharge Plan and Services                                                Prior Living Arrangements/Services                       Activities of Daily Living      Permission Sought/Granted                  Emotional Assessment              Admission diagnosis:  Acute on chronic respiratory failure with hypoxia ([place]) [J96.21] Patient Active Problem List   Diagnosis Date Noted   Acute on chronic respiratory failure with hypoxia ([place]) 07/15/2021   COPD exacerbation ([place]) 07/15/2021   Asthma exacerbation 07/15/2021   Hypertensive emergency 07/15/2021   Stage 3a chronic kidney disease (CKD) ([place]) 07/15/2021   Morbid obesity with BMI of 60.0-69.9, adult ([place]) 07/15/2021   Elevated troponin 07/15/2021   Acute on chronic diastolic congestive heart failure (HCC)    Acute on chronic diastolic CHF (congestive heart failure) ([place]) 03/06/2021   History of stroke 11/17/2020   Ovarian failure 02/21/2020   Primary osteoarthritis of right knee 02/13/2019   S/P total knee arthroplasty, left 02/13/2019   Aortic atherosclerosis ([place]) 08/01/2018   Obstructive lung disease (generalized) ([place]) 07/24/2018   B12 deficiency anemia 07/07/2018   Benign neoplasm of ascending colon    Severe persistent asthma 02/13/2018   Vitamin D deficiency 03/17/2017   Prediabetes 03/14/2017   Microcytic anemia 03/14/2017   IBS (irritable bowel syndrome) 03/12/2017   Allergic rhinitis 03/12/2017   Restless leg syndrome  03/12/2017   GERD (gastroesophageal reflux disease) 10/16/2016   Essential hypertension 10/16/2016   Morbid obesity with BMI of 50.0-59.9, adult ([place]) 12/08/2014   OSA on CPAP 12/08/2014   Shortness of breath 04/07/2014   PCP:  Glean Hess, MD Pharmacy:   [id number] - [place], [place] - [address] [place] [address] Phone: [phone] Fax: [phone]     Social Determinants of Health (SDOH) Interventions    Readmission Risk Interventions No flowsheet data found.

## 2021-07-15 NOTE — Progress Notes (Signed)
°  Carryover admission to the Day Admitter.  I discussed this case with the EDP, Dr.Ward.  Per these discussions:   This is a 71 year old female with chronic hypoxic respiratory failure on 2 L continuous nasal cannula at baseline, who is admitted with acute on chronic hypoxic respiratory failure in the setting of suspected acute CHF exacerbation as well as acute COPD exacerbation after presenting with 1 day of progressive shortness of breath, with worsening edema in the bilateral lower extremities.  Brought in by EMS, and is unclear if the patient was ever objectively hypoxic, but overtly tachypneic, able to speak only 1-2 words at a time.  Empirically started on BiPAP.  Chest x-ray reportedly consistent with pulmonary edema.  Has received IV Lasix, solumedrol, receiving continuous DuoNeb.  Appearing more comfortable now with these measures, but remains on BiPAP at this time.  Blood pressure tolerating.  I subsequently placed order to admit to the stepdown unit for further evaluation and management of acute on chronic hypoxic respiratory failure in the setting of acute CHF exacerbation and acute COPD exacerbation.  I have placed some additional preliminary admit orders via the adult multi morbid admission order set.     Babs Bertin, DO Hospitalist

## 2021-07-15 NOTE — ED Notes (Signed)
Md and rrt notified on patient increased work of breathing Pts filter removed from bipap and dose of lasix/labetelol administered , pt tolerating well at this time.

## 2021-07-15 NOTE — Progress Notes (Signed)
Patient has been reminded several times of need to collect urine output when patient voids. Patient continues to move basin out of the toilet when voiding so staff has been unable to obtain strict intake and output. Will continue to educate patient and request collection basin be left in the commode.

## 2021-07-15 NOTE — ED Provider Notes (Signed)
[name] Provider Note    Event Date/Time   First MD Initiated Contact with Patient 07/15/21 0430     (approximate)   History   Respiratory Distress   HPI  Samantha Mason is a 71 y.o. female with history of morbid obesity, COPD on 2 L of oxygen chronically, hypertension, diastolic heart failure, CVA who presents to the emergency department EMS with respiratory distress, shortness of breath.  EMS reports shortness of breath ongoing for several days that worsened tonight.  She denies any fevers, productive cough, chest pain.  Has significant pitting edema in bilateral lower extremities.  EMS is not able to tell me if she was hypoxic at the scene.  They state when they arrived she was already on CPAP.  She received 125 mg of IV Solu-Medrol with EMS.  She gave herself 3 duo nebs prior to arrival.   History provided by patient, EMS, son.    Past Medical History:  Diagnosis Date   Acute bronchitis    Anemia    Arthritis    Asthma    B12 deficiency 03/14/2017   Chest pain 07/02/2018   COPD (chronic obstructive pulmonary disease) ([place]) 07/24/2018   Family history of blood clots 07/16/2017   Irritable bowel syndrome    Malignant hypertension    Obesity    OSA (obstructive sleep apnea)    uses cpap   Pneumonia 2017   Restless leg syndrome    Stroke ([place]) 1995   mild    Past Surgical History:  Procedure Laterality Date   CHOLECYSTECTOMY     COLONOSCOPY WITH PROPOFOL N/A 03/31/2018   Procedure: COLONOSCOPY WITH PROPOFOL;  Surgeon: Lucilla Lame, MD;  Location: [facility] ENDOSCOPY;  Service: Endoscopy;  Laterality: N/A;   KNEE ARTHROPLASTY Right 04/30/2019   Procedure: COMPUTER ASSISTED TOTAL KNEE ARTHROPLASTY;  Surgeon: Dereck Leep, MD;  Location: [facility] ORS;  Service: Orthopedics;  Laterality: Right;   saliva gland removal  1998   TOTAL KNEE ARTHROPLASTY Left 2014   TUBAL LIGATION Bilateral     MEDICATIONS:  Prior to Admission medications    Medication Sig Start Date End Date Taking? Authorizing Provider  acetaminophen (TYLENOL) 500 MG tablet Take 1,000 mg by mouth every 6 (six) hours as needed.   Yes [provider]  budesonide (PULMICORT) 0.5 MG/2ML nebulizer solution Take 2 mLs (0.5 mg total) by nebulization 2 (two) times daily. 06/26/21 06/26/22 Yes Tyler Pita, MD  cetirizine (ZYRTEC) 10 MG tablet Take 1 tablet (10 mg total) by mouth daily. 12/07/14  Yes Ashok Norris, MD  cholestyramine (QUESTRAN) 4 g packet DISSOLVE AND DRINK 1 PACKET BY MOUTH AT BEDTIME 03/14/21  Yes Glean Hess, MD  diltiazem (CARDIZEM CD) 180 MG 24 hr capsule Take 1 capsule (180 mg total) by mouth daily. 03/28/21 09/24/21 Yes End, Harrell Gave, MD  furosemide (LASIX) 20 MG tablet Take 1 tablet (20 mg total) by mouth daily as needed (swelling or wt gain). 03/10/21  Yes Nicole Kindred A, DO  ipratropium-albuterol (DUONEB) 0.5-2.5 (3) MG/3ML SOLN Take 3 mLs by nebulization every 6 (six) hours as needed. 06/26/21  Yes Tyler Pita, MD  levalbuterol Penne Lash) 1.25 MG/0.5ML nebulizer solution Take 1.25 mg by nebulization every 6 (six) hours as needed for wheezing or shortness of breath. 06/06/21 06/06/22 Yes Tyler Pita, MD  olmesartan (BENICAR) 5 MG tablet Take 1 tablet (5 mg total) by mouth daily. 05/31/21  Yes Tyler Pita, MD  pantoprazole (PROTONIX) 40 MG tablet  TAKE 1 TABLET BY MOUTH DAILY 09/26/20 09/26/21 Yes Glean Hess, MD  PREVALITE 4 g packet Take 1 packet by mouth at bedtime. 03/20/21  Yes [provider]  rOPINIRole (REQUIP) 1 MG tablet TAKE 1 TABLET BY MOUTH THREE TIMES A DAY AS NEEDED 06/27/21  Yes Glean Hess, MD  sodium chloride 0.9 % nebulizer solution as directed. 06/07/21  Yes [provider]  benzonatate (TESSALON) 100 MG capsule Take 1 capsule (100 mg total) by mouth 3 (three) times daily as needed for cough. Patient not taking: Reported on 07/15/2021 05/31/21   Tyler Pita, MD   promethazine (PHENERGAN) 25 MG tablet TAKE 1 TABLET BY MOUTH EVERY 6 HOURS AS NEEDED FOR NAUSEA OR VOMITING. 06/12/21 06/12/22  Glean Hess, MD    Physical Exam   Triage Vital Signs: ED Triage Vitals  Enc Vitals Group     BP 07/15/21 0431 (!) 231/106     Pulse Rate 07/15/21 0431 (!) 132     Resp 07/15/21 0431 (!) 40     Temp --      Temp Source 07/15/21 0431 Axillary     SpO2 07/15/21 0431 100 %     Weight 07/15/21 0432 (!) 372 lb 9.2 oz (169 kg)     Height --      Head Circumference --      Peak Flow --      Pain Score 07/15/21 0432 0     Pain Loc --      Pain Edu? --      Excl. in [place]? --     Most recent vital signs: Vitals:   07/15/21 0610 07/15/21 0612  BP:    Pulse: (!) 112   Resp:    Temp:  97.6 F (36.4 C)  SpO2: 100%     CONSTITUTIONAL: Alert and oriented, in respiratory distress, morbidly obese HEAD: Normocephalic, atraumatic EYES: Conjunctivae clear, pupils appear equal, sclera nonicteric ENT: normal nose; moist mucous membranes NECK: Supple, normal ROM CARD: Regular and tachycardic; S1 and S2 appreciated; no murmurs, no clicks, no rubs, no gallops RESP: Patient is tachypneic and only able to speak 1-2 words at a time.  She is in respiratory distress with increased work of breathing and appears very anxious.  She has significantly diminished aeration diffusely.  I do not appreciate rhonchi, wheezing or rales. ABD/GI: Normal bowel sounds; non-distended; soft, non-tender, no rebound, no guarding, no peritoneal signs BACK: The back appears normal EXT: Normal ROM in all joints; no deformity noted, 3+ pitting edema in bilateral lower extremities, no calf tenderness but exam limited due to morbid obesity and pitting edema SKIN: Normal color for age and race; warm; no rash on exposed skin NEURO: Moves all extremities equally, normal speech PSYCH: The patient's mood and manner are appropriate.   ED Results / Procedures / Treatments   LABS: (all labs ordered  are listed, but only abnormal results are displayed) Labs Reviewed  CBC WITH DIFFERENTIAL/PLATELET - Abnormal; Notable for the following components:      Result Value   Hemoglobin 9.7 (*)    HCT 33.2 (*)    MCV 78.1 (*)    MCH 22.8 (*)    MCHC 29.2 (*)    RDW 15.9 (*)    All other components within normal limits  BASIC METABOLIC PANEL - Abnormal; Notable for the following components:   Chloride 96 (*)    CO2 33 (*)    Glucose, Bld 103 (*)  Creatinine, Ser 1.19 (*)    GFR, Estimated 49 (*)    All other components within normal limits  BRAIN NATRIURETIC PEPTIDE - Abnormal; Notable for the following components:   B Natriuretic Peptide 103.2 (*)    All other components within normal limits  BLOOD GAS, ARTERIAL - Abnormal; Notable for the following components:   pH, Arterial 7.34 (*)    pCO2 arterial 67 (*)    pO2, Arterial 225 (*)    Bicarbonate 36.1 (*)    Acid-Base Excess 7.8 (*)    All other components within normal limits  BLOOD GAS, ARTERIAL - Abnormal; Notable for the following components:   pCO2 arterial 61 (*)    pO2, Arterial 112 (*)    Bicarbonate 38.7 (*)    Acid-Base Excess 11.5 (*)    All other components within normal limits  RESP PANEL BY RT-PCR (FLU A&B, COVID) ARPGX2  TROPONIN I (HIGH SENSITIVITY)  TROPONIN I (HIGH SENSITIVITY)     EKG:  EKG Interpretation  Date/Time:  Sunday July 15 2021 04:39:47 EST Ventricular Rate:  127 PR Interval:  152 QRS Duration: 100 QT Interval:  300 QTC Calculation: 436 R Axis:   57 Text Interpretation: Sinus tachycardia Ventricular premature complex Aberrant conduction of SV complex(es) Low voltage, precordial leads Confirmed by Pryor Curia [phone]) on 07/15/2021 6:40:53 AM         RADIOLOGY: My personal review and interpretation of imaging: Chest x-ray shows pulmonary edema.  I have personally reviewed all radiology reports.   DG Chest Portable 1 View  Result Date: 07/15/2021 CLINICAL DATA:  Shortness of  breath EXAM: PORTABLE CHEST 1 VIEW COMPARISON:  03/08/2021 FINDINGS: Cardiomegaly with diffuse interstitial prominence and cephalized blood flow. Vascular pedicle widening. Mild atelectasis on the right. Elevated right diaphragm, chronic. No visible effusion or pneumothorax. IMPRESSION: CHF pattern. Electronically Signed   By: Jorje Guild M.D.   On: 07/15/2021 04:52     PROCEDURES:  Critical Care performed: Yes, see critical care procedure note(s)   CRITICAL CARE Performed by: Cyril Mourning Somara Frymire   Total critical care time: 65 minutes  Critical care time was exclusive of separately billable procedures and treating other patients.  Critical care was necessary to treat or prevent imminent or life-threatening deterioration.  Critical care was time spent personally by me on the following activities: development of treatment plan with patient and/or surrogate as well as nursing, discussions with consultants, evaluation of patient's response to treatment, examination of patient, obtaining history from patient or surrogate, ordering and performing treatments and interventions, ordering and review of laboratory studies, ordering and review of radiographic studies, pulse oximetry and re-evaluation of patient's condition.   Marland Kitchen1-3 Lead EKG Interpretation Performed by: Cameran Ahmed, Delice Bison, DO Authorized by: Antinio Sanderfer, Delice Bison, DO     Interpretation: abnormal     ECG rate:  125   ECG rate assessment: tachycardic     Rhythm: sinus tachycardia     Ectopy: none     Conduction: normal      IMPRESSION / MDM / ASSESSMENT AND PLAN / ED COURSE  I reviewed the triage vital signs and the nursing notes.    Patient here with respiratory distress, peripheral edema on CPAP.  The patient is on the cardiac monitor to evaluate for evidence of arrhythmia and/or significant heart rate changes.   DIFFERENTIAL DIAGNOSIS (includes but not limited to):   CHF exacerbation, COPD/asthma exacerbation, pneumothorax,  pneumonia, PE, ACS   PLAN: We will obtain CBC, BMP, troponin, BNP, chest  x-ray.  We will continue albuterol treatments and give magnesium and Lasix.  EKG shows sinus tachycardia without ischemia.  We will obtain an ABG.  We have transitioned her to BiPAP.   MEDICATIONS GIVEN IN ED: Medications  acetaminophen (TYLENOL) tablet 650 mg (has no administration in time range)    Or  acetaminophen (TYLENOL) suppository 650 mg (has no administration in time range)  nitroGLYCERIN (NITROGLYN) 2 % ointment 1 inch (has no administration in time range)  albuterol (PROVENTIL) (2.5 MG/3ML) 0.083% nebulizer solution 15 mg (15 mg Nebulization Given 07/15/21 0436)  magnesium sulfate IVPB 2 g 50 mL (0 g Intravenous Stopped 07/15/21 0606)  furosemide (LASIX) injection 40 mg (40 mg Intravenous Given 07/15/21 0449)  hydrALAZINE (APRESOLINE) injection 10 mg (10 mg Intravenous Given 07/15/21 7416)     ED COURSE: Labs show anemia which is close to where she was in October.  She has chronic kidney disease which looks stable.  Troponin is negative.  BNP minimally elevated at 103 but may be falsely low due to morbid obesity.  Her initial ABG shows a respiratory acidosis.  Chest x-ray reviewed by myself and radiologist and shows pulmonary edema.  No infiltrate or pneumothorax.  She seems to be clinically improving on BiPAP.  We will closely monitor and recheck ABG in 1 hour.    Patient looks like she is much more comfortable.  Tachypnea has improved as has her tachycardia.  She was extremely hypertensive here which I think may be contributing to her pulmonary edema.  She has received Lasix and hydralazine.  We will place an inch of Nitropaste on her chest as well for hypertensive emergency.  Repeat ABG shows improvement in her PCO2 and pH.  I feel she is appropriate to be admitted to the hospitalist service.   CONSULTS:  Consulted and discussed patient's case with hospitalist, Dr. Velia Meyer.  I have recommended admission and  consulting physician agrees and will place admission orders.  Patient (and family if present) agree with this plan.   I reviewed all nursing notes, vitals, pertinent previous records.  All labs, EKGs, imaging ordered have been independently reviewed and interpreted by myself.    OUTSIDE RECORDS REVIEWED: Reviewed patient's last office visit with pulmonology Dr. Vernard Gambles on 05/31/2021 and cardiology note with Dr. Harrell Gave and on 03/28/2021.         FINAL CLINICAL IMPRESSION(S) / ED DIAGNOSES   Final diagnoses:  Acute on chronic diastolic congestive heart failure (HCC)  Acute respiratory failure with hypoxia and hypercapnia (HCC)  COPD exacerbation ([place])  Hypertensive emergency     Rx / DC Orders   ED Discharge Orders     None        Note:  This document was prepared using Dragon voice recognition software and may include unintentional dictation errors.   Malijah Lietz, Delice Bison, DO 07/15/21 [phone]

## 2021-07-16 ENCOUNTER — Inpatient Hospital Stay (HOSPITAL_COMMUNITY)
Admit: 2021-07-16 | Discharge: 2021-07-16 | Disposition: A | Discharge status: Home / Self Care | Payer: Medicare Other | Attending: Internal Medicine | Admitting: Internal Medicine

## 2021-07-16 DIAGNOSIS — I5031 Acute diastolic (congestive) heart failure: Secondary | ICD-10-CM | POA: Diagnosis not present

## 2021-07-16 DIAGNOSIS — J9621 Acute and chronic respiratory failure with hypoxia: Secondary | ICD-10-CM | POA: Diagnosis not present

## 2021-07-16 DIAGNOSIS — J449 Chronic obstructive pulmonary disease, unspecified: Secondary | ICD-10-CM

## 2021-07-16 DIAGNOSIS — I5033 Acute on chronic diastolic (congestive) heart failure: Secondary | ICD-10-CM | POA: Diagnosis not present

## 2021-07-16 DIAGNOSIS — J441 Chronic obstructive pulmonary disease with (acute) exacerbation: Secondary | ICD-10-CM | POA: Diagnosis not present

## 2021-07-16 LAB — ECHOCARDIOGRAM COMPLETE
AR max vel: 2.86 cm2
AV Area VTI: 3.52 cm2
AV Area mean vel: 2.91 cm2
AV Mean grad: 6 mmHg
AV Peak grad: 12.8 mmHg
Ao pk vel: 1.79 m/s
Area-P 1/2: 12.86 cm2
Height: 64 in
MV VTI: 3.35 cm2
S' Lateral: 3.23 cm
Weight: 5693.16 oz

## 2021-07-16 LAB — BASIC METABOLIC PANEL
Anion gap: 15 (ref 5–15)
BUN: 30 mg/dL — ABNORMAL HIGH (ref 8–23)
CO2: 32 mmol/L (ref 22–32)
Calcium: 9.8 mg/dL (ref 8.9–10.3)
Chloride: 90 mmol/L — ABNORMAL LOW (ref 98–111)
Creatinine, Ser: 1.67 mg/dL — ABNORMAL HIGH (ref 0.44–1.00)
GFR, Estimated: 33 mL/min — ABNORMAL LOW (ref >60)
Glucose, Bld: 143 mg/dL — ABNORMAL HIGH (ref 70–99)
Potassium: 4.5 mmol/L (ref 3.5–5.1)
Sodium: 137 mmol/L (ref 135–145)

## 2021-07-16 LAB — LIPID PANEL
Cholesterol: 160 mg/dL (ref 0–200)
HDL: 81 mg/dL (ref >40)
LDL Cholesterol: 68 mg/dL (ref 0–99)
Total CHOL/HDL Ratio: 2 RATIO
Triglycerides: 57 mg/dL (ref <150)
VLDL: 11 mg/dL (ref 0–40)

## 2021-07-16 LAB — MAGNESIUM: Magnesium: 2 mg/dL (ref 1.7–2.4)

## 2021-07-16 MED ORDER — PERFLUTREN LIPID MICROSPHERE
1.0000 mL | INTRAVENOUS | Status: AC | PRN
  Administered 2021-07-16: 3 mL via INTRAVENOUS
  Filled 2021-07-16: qty 10

## 2021-07-16 MED ORDER — ROSUVASTATIN CALCIUM 5 MG PO TABS: 5.0000 mg | ORAL_TABLET | Freq: Every day | ORAL | Status: DC

## 2021-07-16 MED ORDER — METHYLPREDNISOLONE SODIUM SUCC 40 MG IJ SOLR
40.0000 mg | Freq: Every day | INTRAMUSCULAR | Status: AC
  Administered 2021-07-17 – 2021-07-18 (×2): 40 mg via INTRAVENOUS
  Filled 2021-07-16 (×2): qty 1

## 2021-07-16 MED ORDER — POTASSIUM CHLORIDE CRYS ER 20 MEQ PO TBCR: 20.0000 meq | EXTENDED_RELEASE_TABLET | Freq: Every day | ORAL | Status: DC

## 2021-07-16 NOTE — Assessment & Plan Note (Addendum)
Patient on Lasix 60 mg IV twice a day.  Patient's baseline weight around 322.  Weight this morning of 328.  Discharged on Demadex.  She will follow-up with cardiology as outpatient

## 2021-07-16 NOTE — Assessment & Plan Note (Addendum)
Hemoglobin remaining stable, today at 9.6.  Received IV iron during the hospital course.

## 2021-07-16 NOTE — Assessment & Plan Note (Deleted)
Without chest pain, myocardial injury likely due to respiratory failure

## 2021-07-16 NOTE — Assessment & Plan Note (Addendum)
LDL 68, Continue low-dose aspirin

## 2021-07-16 NOTE — Assessment & Plan Note (Addendum)
Transitional care working on setting up NIV for home.  Patient on baseline 2 L of oxygen.  Initially required BiPAP for respiratory distress.  Unable to tolerate the BiPAP and was switched over to CPAP because we do not have an IV here.  Set up with [facility] through [facility].

## 2021-07-16 NOTE — Assessment & Plan Note (Deleted)
Creatinine 1.83 today.  Better than the 2.03 from yesterday.

## 2021-07-16 NOTE — Assessment & Plan Note (Addendum)
Patient is at home CPAP not working.  TOC set up noninvasive ventilation at night.  Currently not tolerating the BiPAP very well and went back to CPAP.  Set up with trilogy as outpatient

## 2021-07-16 NOTE — Hospital Course (Addendum)
Samantha Mason is a 71 y.o. F with diastolic CHF, and COPD with chronic respiratory failure on 2 L nasal cannula as well as obstructive sleep apnea on CPAP, stage IIIb chronic kidney disease, hypertension and morbid obesity who presented to the emergency room on 2/26 for leg swelling and shortness of breath and found to be acute diastolic heart failure.  The patient's baseline weight is 322 pounds and she came in at 359 pounds.    Patient has been undergoing IV diuresis with Lasix and by day of discharge down to 328 pounds.  At this point, patient felt to be stable and was unclear if this was her new norm in terms of dry weight but felt the rest of her diuresis could be managed at home.

## 2021-07-16 NOTE — Assessment & Plan Note (Addendum)
Likely CHF and not COPD.  Continue nebulizers as needed

## 2021-07-16 NOTE — Assessment & Plan Note (Addendum)
Discharged on Cardizem and Demadex

## 2021-07-16 NOTE — Assessment & Plan Note (Deleted)
See above

## 2021-07-16 NOTE — Progress Notes (Signed)
Progress Note   Patient: Samantha Mason KKX:[id number] DOB: Mar 15, 1951 DOA: 07/15/2021     1 DOS: the patient was seen and examined on 07/16/2021       Brief hospital course: Samantha Mason is a 71 y.o. F with dCHF and COPD on 2L home O2, OSA on CPAP, anemia, IBS, morbid obesity, and HTN and CKD IIIb baseline 1.2-1.6 who presented with dyspnea for a few days and dry cough.  In the ER, she was in severe respiratory distress, required BiPAP.  CXR with edema, BP 231/106, pCO2 67.  Was admitted and started on Lasix, steroids, bronchodilators.        Assessment and Plan: * Acute on chronic respiratory failure with hypoxia ([place])- (present on admission) Admitted in respiratory distress requiring BiPAP. Improving with Lasix, off BiPAP now Due to CHF.  Acute on chronic diastolic CHF (congestive heart failure) ([place])- (present on admission) Ins and outs not measured yesterday, reports good urine output Creatinine slightly up today Still appears quite volume overloaded and out of breath at rest -Follow echocardiogram  - Continue IV Lasix twice daily - Hold potassium for now - Strict ins and outs, daily weights, - Close monitoring of renal function     Myocardial injury- (present on admission) Without chest pain, myocardial injury likely due to respiratory failure  Morbid obesity with BMI of 60.0-69.9, adult (HCC) BMI 61  Stage 3a chronic kidney disease (CKD) ([place])- (present on admission) Creatinine 1.6 today, close to her baseline    Hypertensive emergency- (present on admission) See above  Asthma with COPD ([place])- (present on admission)    COPD exacerbation ([place])- (present on admission) It is possible that some of her dyspnea and wheezing yesterday were from asthma. No significant sputum to indicate antibiotics utility - Continue steroids, reduced dose - Continue bronchodilators  History of stroke LDL 68, defer statin - Continue low-dose aspirin   Microcytic  anemia- (present on admission) Hemoglobin stable relative to baseline, no clinical bleeding - Check iron studies  Essential hypertension- (present on admission) Blood pressure initially 200s.  140s to 170s overnight with diuresis. - Continue diltiazem, furosemide - Continue as needed hydralazine  OSA on CPAP - Continue CPAP at night - Hold Provigil        Subjective: Patient is feeling somewhat better.  She still very swollen legs.  No Chest pain, wheezing is resolved.  No sputum.  No confusion, no fever.  Physical Exam: Vitals:   07/16/21 0432 07/16/21 0747 07/16/21 1220 07/16/21 1428  BP:  (!) 165/75 (!) 152/72   Pulse:  (!) 102    Resp:  20    Temp:      TempSrc:      SpO2: 95% 97%  96%  Weight:      Height:       Obese adult female, sitting on edge of the bed, eating breakfast RRR, no murmurs, heart sounds distant, marked peripheral edema in the legs, pitting Respiratory rate increased, appears dyspneic at rest, lung sounds distant, cannot appreciate wheezing Mentation normal, face symmetric, speech fluent, moves extremities with normal strength and coordination, attention normal, affect appropriate.    Data Reviewed: Labs and imaging are notable for creatinine 1.2 up to 1.7 today, troponins 30s and flat, LDL 68, respiratory virus panel normal, microcytic anemia with hemoglobin 9.7, ECG with normal sinus rhythm, nonspecific changes Chest x-ray with edema, A1c 5.4  Family Communication:   Disposition: Status is: Inpatient Remains inpatient appropriate because: Patient requires ongoing IV Lasix, likely  for 2-3 more days until she is completely diuresed, with close monitoring of renal function given no change overnight.          Planned Discharge Destination:  TBD        Author: Edwin Dada, MD 07/16/2021 2:50 PM  For on call review [email or web address].

## 2021-07-16 NOTE — Consult Note (Addendum)
° °  Heart Failure Nurse Navigator Note  HFpEF 55-60%.  Grade 1 Diastolic dysfunction echo performed in April 2022.  Echocardiogram results pending on this admission.  She presented to the emergency room with complaints of worsening shortness of breath, unable to speak in full sentence and had also noted worsening lower extremity edema for 2 weeks.  Comorbidities:  Hypertension COPD on 2 L of oxygen Asthma Stroke GERD Obstructive sleep apnea/CPAP Anemia Morbid obesity  Medications:  Aspirin 81 mg daily Diltiazem 180 mg daily Furosemide 40 mg IV every 12 hours  Labs:  Sodium 137, potassium 4.5, chloride 90, CO2 32, BUN 30, creatinine 1.67 GFR 33, magnesium 2, total cholesterol 160, triglycerides 57, HDL 81, LDL 68.  Hemoglobin A1c is 6.2 Weight is 161.4 kg Blood pressure 152/72 Intake 720 mL Output 700 mL  Initial meeting with patient and her son that she lives with.  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  Her son is the one that prepares the meals, he states that if her recipe calls for salt he will decrease the amount that he puts in the food that he is preparing.  She does not use salt at the table.  They eat at restaurants very rarely.    Discussed fluid restriction in the reasoning behind, she did not feel that she went over 64 ounces daily, mostly drinking water.  Discussed the importance of daily weight and what to report.  She is not currently using the CPAP, as it is not working but goes on to state the mask makes her claustrophobiic.    She has an appointment with the outpatient heart failure clinic on March 7 at 3:30 in the afternoon.  She has a no-show ratio of 10% which is 9 out of 87 appointments.  Also made aware of the [place] program patient is very interested.  Referral sent to [facility].  She was given the living with heart failure teaching booklet,  zone magnet, information on low-sodium and also supplied with a low-sodium cookbook.  Pricilla Riffle RN [facility]

## 2021-07-16 NOTE — Plan of Care (Signed)
?  Problem: Education: ?Goal: Ability to demonstrate management of disease process will improve ?Outcome: Progressing ?Goal: Ability to verbalize understanding of medication therapies will improve ?Outcome: Progressing ?Goal: Individualized Educational Video(s) ?Outcome: Progressing ?  ?Problem: Activity: ?Goal: Capacity to carry out activities will improve ?Outcome: Progressing ?  ?Problem: Cardiac: ?Goal: Ability to achieve and maintain adequate cardiopulmonary perfusion will improve ?Outcome: Progressing ?  ?Problem: Education: ?Goal: Knowledge of disease or condition will improve ?Outcome: Progressing ?Goal: Knowledge of the prescribed therapeutic regimen will improve ?Outcome: Progressing ?Goal: Individualized Educational Video(s) ?Outcome: Progressing ?  ?Problem: Activity: ?Goal: Ability to tolerate increased activity will improve ?Outcome: Progressing ?Goal: Will verbalize the importance of balancing activity with adequate rest periods ?Outcome: Progressing ?  ?Problem: Respiratory: ?Goal: Ability to maintain a clear airway will improve ?Outcome: Progressing ?Goal: Levels of oxygenation will improve ?Outcome: Progressing ?Goal: Ability to maintain adequate ventilation will improve ?Outcome: Progressing ?  ?

## 2021-07-16 NOTE — Progress Notes (Signed)
*  PRELIMINARY RESULTS* Echocardiogram 2D Echocardiogram has been performed.  Samantha Mason 07/16/2021, 10:02 AM

## 2021-07-16 NOTE — Assessment & Plan Note (Addendum)
Meets criteria with BMI greater than 40 

## 2021-07-17 ENCOUNTER — Inpatient Hospital Stay: Payer: Medicare Other

## 2021-07-17 DIAGNOSIS — I5033 Acute on chronic diastolic (congestive) heart failure: Secondary | ICD-10-CM | POA: Diagnosis not present

## 2021-07-17 DIAGNOSIS — J441 Chronic obstructive pulmonary disease with (acute) exacerbation: Secondary | ICD-10-CM | POA: Diagnosis not present

## 2021-07-17 DIAGNOSIS — J9621 Acute and chronic respiratory failure with hypoxia: Secondary | ICD-10-CM | POA: Diagnosis not present

## 2021-07-17 DIAGNOSIS — J449 Chronic obstructive pulmonary disease, unspecified: Secondary | ICD-10-CM | POA: Diagnosis not present

## 2021-07-17 DIAGNOSIS — N179 Acute kidney failure, unspecified: Secondary | ICD-10-CM | POA: Diagnosis present

## 2021-07-17 DIAGNOSIS — N189 Chronic kidney disease, unspecified: Secondary | ICD-10-CM | POA: Diagnosis present

## 2021-07-17 LAB — BASIC METABOLIC PANEL
Anion gap: 14 (ref 5–15)
BUN: 41 mg/dL — ABNORMAL HIGH (ref 8–23)
CO2: 31 mmol/L (ref 22–32)
Calcium: 9.3 mg/dL (ref 8.9–10.3)
Chloride: 91 mmol/L — ABNORMAL LOW (ref 98–111)
Creatinine, Ser: 2.03 mg/dL — ABNORMAL HIGH (ref 0.44–1.00)
GFR, Estimated: 26 mL/min — ABNORMAL LOW (ref >60)
Glucose, Bld: 107 mg/dL — ABNORMAL HIGH (ref 70–99)
Potassium: 4.3 mmol/L (ref 3.5–5.1)
Sodium: 136 mmol/L (ref 135–145)

## 2021-07-17 LAB — URINALYSIS, COMPLETE (UACMP) WITH MICROSCOPIC
Bacteria, UA: NONE SEEN
Bilirubin Urine: NEGATIVE
Glucose, UA: NEGATIVE mg/dL
Hgb urine dipstick: NEGATIVE
Ketones, ur: NEGATIVE mg/dL
Leukocytes,Ua: NEGATIVE
Nitrite: NEGATIVE
Protein, ur: NEGATIVE mg/dL
Specific Gravity, Urine: 1.011 (ref 1.005–1.030)
pH: 5 (ref 5.0–8.0)

## 2021-07-17 LAB — IRON AND TIBC
Iron: 31 ug/dL (ref 28–170)
Saturation Ratios: 8 % — ABNORMAL LOW (ref 10.4–31.8)
TIBC: 400 ug/dL (ref 250–450)
UIBC: 369 ug/dL

## 2021-07-17 LAB — CBC
HCT: 29.2 % — ABNORMAL LOW (ref 36.0–46.0)
Hemoglobin: 8.8 g/dL — ABNORMAL LOW (ref 12.0–15.0)
MCH: 22.9 pg — ABNORMAL LOW (ref 26.0–34.0)
MCHC: 30.1 g/dL (ref 30.0–36.0)
MCV: 76 fL — ABNORMAL LOW (ref 80.0–100.0)
Platelets: 414 10*3/uL — ABNORMAL HIGH (ref 150–400)
RBC: 3.84 MIL/uL — ABNORMAL LOW (ref 3.87–5.11)
RDW: 16.3 % — ABNORMAL HIGH (ref 11.5–15.5)
WBC: 12.5 10*3/uL — ABNORMAL HIGH (ref 4.0–10.5)
nRBC: 0 % (ref 0.0–0.2)

## 2021-07-17 LAB — CREATININE, URINE, RANDOM: Creatinine, Urine: 101 mg/dL

## 2021-07-17 LAB — HEPATITIS B SURFACE ANTIBODY,QUALITATIVE: Hep B S Ab: NONREACTIVE

## 2021-07-17 LAB — PROTEIN / CREATININE RATIO, URINE
Creatinine, Urine: 104 mg/dL
Protein Creatinine Ratio: 0.06 mg/mg{Cre} (ref 0.00–0.15)
Total Protein, Urine: 6 mg/dL

## 2021-07-17 LAB — HEPATITIS B CORE ANTIBODY, TOTAL: Hep B Core Total Ab: NONREACTIVE

## 2021-07-17 LAB — FERRITIN: Ferritin: 19 ng/mL (ref 11–307)

## 2021-07-17 LAB — HEPATITIS C ANTIBODY: HCV Ab: NONREACTIVE

## 2021-07-17 LAB — HEPATITIS B SURFACE ANTIGEN: Hepatitis B Surface Ag: NONREACTIVE

## 2021-07-17 MED ORDER — FERROUS SULFATE 325 (65 FE) MG PO TABS
325.0000 mg | ORAL_TABLET | ORAL | Status: DC
  Administered 2021-07-17: 325 mg via ORAL
  Filled 2021-07-17: qty 1

## 2021-07-17 MED ORDER — FUROSEMIDE 10 MG/ML IJ SOLN
60.0000 mg | Freq: Two times a day (BID) | INTRAMUSCULAR | Status: DC
  Administered 2021-07-17 – 2021-07-25 (×16): 60 mg via INTRAVENOUS
  Filled 2021-07-17 (×16): qty 6

## 2021-07-17 MED ORDER — FUROSEMIDE 40 MG PO TABS: 40.0000 mg | ORAL_TABLET | Freq: Every day | ORAL | Status: DC

## 2021-07-17 MED ORDER — SODIUM CHLORIDE 0.9 % IV SOLN
125.0000 mg | Freq: Every day | INTRAVENOUS | Status: AC
  Administered 2021-07-17 – 2021-07-20 (×4): 125 mg via INTRAVENOUS
  Filled 2021-07-17 (×4): qty 10

## 2021-07-17 MED ORDER — FUROSEMIDE 40 MG PO TABS: 40.0000 mg | ORAL_TABLET | Freq: Two times a day (BID) | ORAL | Status: DC

## 2021-07-17 NOTE — Progress Notes (Addendum)
Progress Note   Patient: KAYLEENA EKE SLH:[id number] DOB: 06-27-1950 DOA: 07/15/2021     2 DOS: the patient was seen and examined on 07/17/2021       Brief hospital course: Mrs. Woolen is a 71 y.o. F with dCHF and COPD on 2L home O2, OSA on CPAP, anemia, IBS, morbid obesity, and HTN and CKD IIIb baseline 1.2-1.6 who presented with leg swelling for 2 weeks, then dyspnea for a few days and dry cough.  In the ER, she was in severe respiratory distress, required BiPAP.  CXR with edema, BP 231/106, pCO2 67.  Was admitted and started on Lasix, steroids, bronchodilators.   2/27: Started diuresis, Cr trending up  2/28: Cr worse, Nephrology consulted        Assessment and Plan: * Acute on chronic respiratory failure with hypoxia ([place])- (present on admission) Admitted in respiratory distress requiring BiPAP. Improving with Lasix, off BiPAP now Due to CHF.  Acute on chronic diastolic CHF (congestive heart failure) ([place])- (present on admission) Net negative only 900cc yesterday, but up four pounds.  Cr up again, and patient appearing to have worse congestion. Echo with preserved EF. - Consult Nephrology for worsening renal function - Increase Lasix to 60 BID  - Hold potassium for now - Strict ins and outs, daily weights, - Close monitoring of renal function     Acute kidney injury superimposed on chronic kidney disease ([place]) I suspect this is congestive. - Consult Nephrology - Obtain US renal - Check urine protein, urine electrolytes   Myocardial injury- (present on admission) Without chest pain, myocardial injury likely due to respiratory failure  Morbid obesity with BMI of 60.0-69.9, adult (HCC) BMI 61  Stage 3a chronic kidney disease (CKD) ([place])- (present on admission) Creatinine 1.6 today, close to her baseline    Hypertensive emergency- (present on admission) See above  Asthma with COPD ([place])- (present on admission)    COPD exacerbation ([place])- (present  on admission) It is possible that some of her dyspnea and wheezing yesterday were from asthma, but this is not the main driver. No significant sputum to indicate antibiotics utility - Continue steroids, two more days - Continue bronchodilators  History of stroke LDL 68, defer statin - Continue low-dose aspirin   Microcytic anemia- (present on admission) Hemoglobin stable relative to baseline, no clinical bleeding Iron sats low despite oral iron - Start IV iron  Essential hypertension- (present on admission) Blood pressure initially 200s.  Now resolved - Continue diltiazem, furosemide - Continue as needed hydralazine  OSA on CPAP - Continue CPAP at night - Hold Provigil        Subjective: More swollen today, coughing more, more orthopneic, more congested, still extremely short of breath, had to sleep in a chair.  Physical Exam: Vitals:   07/17/21 0756 07/17/21 1100 07/17/21 1202 07/17/21 1343  BP: (!) 139/55  (!) 154/60   Pulse: 88  88   Resp:      Temp: 97.9 F (36.6 C)  98 F (36.7 C)   TempSrc: Oral     SpO2: 96% 94% 96% 94%  Weight:      Height:       Obese adult female, sitting on edge of the bed, appears uncomfortable RRR, no murmurs, heart sounds reduced, lower extremity edema still present, worse than yesterday  Respiratory rate increased, appears dyspneic at rest, lung sounds distant, cannot appreciate wheezing Mentation normal, face symmetric, speech fluent, moves extremities with normal strength and coordination, attention normal, affect appropriate.  Data Reviewed: discussed with nephrology She is worsening Her creatinine is up to 2 today, her iron stores are low, her hemoglobin is unchanged at 8.8 her other electrolytes are stable.  Family Communication:   Disposition: Status is: Inpatient Remains inpatient appropriate because: Patient has worsening congestion and renal failure.  She will require ongoing Lasix and close monitoring of renal  function with nephrology.  Likely several more days admission at least          Planned Discharge Destination:  TBD        Author: Edwin Dada, MD 07/17/2021 2:32 PM  For on call review [email or web address].

## 2021-07-17 NOTE — Progress Notes (Signed)
Rapid Response Event Note   Reason for Call : shortness of breath   Initial Focused Assessment: On my arrival pt is sitting up in recliner, coughing, and is visibly short of breath. She is alert and oriented. All VSS.    Interventions: Discussed with primary RN about administering lasix dose that is ordered for 1800. Dr. Loleta Books at bedside and agrees to go ahead and give ordered 60mg  lasix IV push. MD also wants pt placed on bipap, RT at bedside and is aware. Chest xray ordered.   Plan of Care: Dr. Loleta Books has changed pt's status to PCU. Pt will remain on progressive unit at this time and is agreeable to bipap. Will follow up with primary RN before shift change.    Event Summary:   MD Notified: Dr. Loleta Books Call Time: [place] Time: 8403 End Time: [place], RN

## 2021-07-17 NOTE — Progress Notes (Signed)
Patient on BIPAP, per pt she is feeling much better, She looks relaxed, son at bedside.

## 2021-07-17 NOTE — Progress Notes (Signed)
Chaplain responded to rapid code. Offered prayer, ministry of presence.

## 2021-07-17 NOTE — Consult Note (Signed)
[facility]  CONSULT NOTE    Date: 07/17/2021                  Patient Name:  Samantha Mason  MRN: [id number]  DOB: 09-15-1950  Age / Sex: 71 y.o., female         PCP: Glean Hess, MD                 Service Requesting Consult: Dr. Loleta Books                 Reason for Consult: Acute kidney injury            History of Present Illness: Ms. Samantha Mason has been having shortness of breath, progressive peripheral edema and cough for the last 2 weeks. Patient's son is at bedside who assists with history taking. Patient's mother was previously a patient of mine. 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   Patient states she has been taking her furosemide as needed. She has not been adding salt to her food. However her food may contain salt. Patient states for the last 2 weeks, despite use of furosemide, her legs continue to swell and she is having worsening shortness of breath, cough, dyspnea on exertion and wheezing. Denies any fevers or chills. Denies any sick contacts.   Patient did reach out to her pulmonologist when symptoms began and stopped her Trelegy and was doing duoneb, pulmicort and xopenex.   Patient currently without a CPAP machine.    Medications: Outpatient medications: Medications Prior to Admission  Medication Sig Dispense Refill Last Dose   acetaminophen (TYLENOL) 500 MG tablet Take 1,000 mg by mouth every 6 (six) hours as needed.   prn at prn   budesonide (PULMICORT) 0.5 MG/2ML nebulizer solution Take 2 mLs (0.5 mg total) by nebulization 2 (two) times daily. 120 mL 2 07/14/2021   cetirizine (ZYRTEC) 10 MG tablet Take 1 tablet (10 mg total) by mouth daily. 30 tablet 2 prn at prn   cholestyramine (QUESTRAN) 4 g packet DISSOLVE AND DRINK 1 PACKET BY MOUTH AT BEDTIME 30 packet 11 07/14/2021   diltiazem (CARDIZEM CD) 180 MG 24 hr capsule Take 1 capsule (180 mg total) by mouth daily. 90 capsule 1 Past Month   furosemide  (LASIX) 20 MG tablet Take 1 tablet (20 mg total) by mouth daily as needed (swelling or wt gain). 90 tablet 3 07/14/2021   ipratropium-albuterol (DUONEB) 0.5-2.5 (3) MG/3ML SOLN Take 3 mLs by nebulization every 6 (six) hours as needed. 360 mL 2 07/15/2021   levalbuterol (XOPENEX) 1.25 MG/0.5ML nebulizer solution Take 1.25 mg by nebulization every 6 (six) hours as needed for wheezing or shortness of breath. 1 each 2 prn at prn   olmesartan (BENICAR) 5 MG tablet Take 1 tablet (5 mg total) by mouth daily. 30 tablet 0 Past Month   pantoprazole (PROTONIX) 40 MG tablet TAKE 1 TABLET BY MOUTH DAILY 90 tablet 1 07/14/2021   PREVALITE 4 g packet Take 1 packet by mouth at bedtime.   07/14/2021   rOPINIRole (REQUIP) 1 MG tablet TAKE 1 TABLET BY MOUTH THREE TIMES A DAY AS NEEDED 90 tablet 0 07/14/2021   sodium chloride 0.9 % nebulizer solution as directed.   Past Month   benzonatate (TESSALON) 100 MG capsule Take 1 capsule (100 mg total) by mouth 3 (three) times daily as needed for cough. (Patient not taking: Reported on 07/15/2021) 20 capsule 1 Not Taking  promethazine (PHENERGAN) 25 MG tablet TAKE 1 TABLET BY MOUTH EVERY 6 HOURS AS NEEDED FOR NAUSEA OR VOMITING. 30 tablet 1 prn at prn    Current medications: Current Facility-Administered Medications  Medication Dose Route Frequency Provider Last Rate Last Admin   acetaminophen (TYLENOL) tablet 650 mg  650 mg Oral Q6H PRN Howerter, Justin B, DO   650 mg at 07/15/21 2030   Or   acetaminophen (TYLENOL) suppository 650 mg  650 mg Rectal Q6H PRN Howerter, Justin B, DO       albuterol (PROVENTIL) (2.5 MG/3ML) 0.083% nebulizer solution 2.5 mg  2.5 mg Nebulization Q4H PRN Ivor Costa, MD   2.5 mg at 07/17/21 1342   aspirin EC tablet 81 mg  81 mg Oral Daily Ivor Costa, MD   81 mg at 07/15/21 1006   cholestyramine (QUESTRAN) packet 4 g  4 g Oral QHS Ivor Costa, MD   4 g at 07/16/21 2139   dextromethorphan-guaiFENesin ([place] DM) 30-600 MG per 12 hr tablet 1 tablet  1  tablet Oral BID PRN Edwin Dada, MD   1 tablet at 07/17/21 0354   diltiazem (CARDIZEM CD) 24 hr capsule 180 mg  180 mg Oral Daily Ivor Costa, MD   180 mg at 07/17/21 0811   enoxaparin (LOVENOX) injection 85 mg  0.5 mg/kg Subcutaneous Q24H Ivor Costa, MD   85 mg at 07/17/21 7371   ferric gluconate (FERRLECIT) 125 mg in sodium chloride 0.9 % 100 mL IVPB  125 mg Intravenous Daily Danford, Suann Larry, MD       furosemide (LASIX) tablet 40 mg  40 mg Oral BID Colon Flattery, NP       hydrALAZINE (APRESOLINE) injection 10 mg  10 mg Intravenous Q2H PRN Ivor Costa, MD   10 mg at 07/16/21 1810   ipratropium-albuterol (DUONEB) 0.5-2.5 (3) MG/3ML nebulizer solution 3 mL  3 mL Nebulization Q4H Ivor Costa, MD   3 mL at 07/17/21 1100   loperamide (IMODIUM) capsule 2 mg  2 mg Oral PRN Foust, Katy L, NP   2 mg at 07/15/21 2248   loratadine (CLARITIN) tablet 10 mg  10 mg Oral Daily Ivor Costa, MD   10 mg at 07/17/21 0626   methylPREDNISolone sodium succinate (SOLU-MEDROL) 40 mg/mL injection 40 mg  40 mg Intravenous Daily Edwin Dada, MD   40 mg at 07/17/21 0812   ondansetron (ZOFRAN) injection 4 mg  4 mg Intravenous Q8H PRN Ivor Costa, MD       pantoprazole (PROTONIX) EC tablet 40 mg  40 mg Oral Daily Ivor Costa, MD   40 mg at 07/17/21 9485   rOPINIRole (REQUIP) tablet 1 mg  1 mg Oral TID PRN Ivor Costa, MD   1 mg at 07/17/21 0818      Allergies: Allergies  Allergen Reactions   Nuvigil [Armodafinil] Hives   Penicillins Diarrhea and Nausea And Vomiting    Did it involve swelling of the face/tongue/throat, SOB, or low BP? no Did it involve sudden or severe rash/hives, skin peeling, or any reaction on the inside of your mouth or nose? No Did you need to seek medical attention at a hospital or doctor's office? No When did it last happen?  in her 65s    If all above answers are NO, may proceed with cephalosporin use.    Gabapentin Other (See Comments)    "wiped her out, couldn't  stay awake"   Provigil [Modafinil] Hives      Past Medical History:  Past Medical History:  Diagnosis Date   Acute bronchitis    Anemia    Arthritis    Asthma    B12 deficiency 03/14/2017   Chest pain 07/02/2018   COPD (chronic obstructive pulmonary disease) ([place]) 07/24/2018   Family history of blood clots 07/16/2017   Irritable bowel syndrome    Malignant hypertension    Obesity    OSA (obstructive sleep apnea)    uses cpap   Pneumonia 2017   Restless leg syndrome    Stroke ([place]) 1995   mild     Past Surgical History: Past Surgical History:  Procedure Laterality Date   CHOLECYSTECTOMY     COLONOSCOPY WITH PROPOFOL N/A 03/31/2018   Procedure: COLONOSCOPY WITH PROPOFOL;  Surgeon: Lucilla Lame, MD;  Location: [facility] ENDOSCOPY;  Service: Endoscopy;  Laterality: N/A;   KNEE ARTHROPLASTY Right 04/30/2019   Procedure: COMPUTER ASSISTED TOTAL KNEE ARTHROPLASTY;  Surgeon: Dereck Leep, MD;  Location: [facility] ORS;  Service: Orthopedics;  Laterality: Right;   saliva gland removal  1998   TOTAL KNEE ARTHROPLASTY Left 2014   TUBAL LIGATION Bilateral      Family History: Family History  Problem Relation Age of Onset   Dementia Mother    COPD Father        12   Diabetes Father    Heart failure Brother    Coronary artery disease Brother 11       CABG   Heart failure Maternal Grandmother    Stroke Maternal Grandmother    Breast cancer Neg Hx      Social History: Social History   Socioeconomic History   Marital status: Widowed    Spouse name: Not on file   Number of children: 2   Years of education: Not on file   Highest education level: Not on file  Occupational History   Not on file  Tobacco Use   Smoking status: Never   Smokeless tobacco: Never  Vaping Use   Vaping Use: Never used  Substance and Sexual Activity   Alcohol use: No    Alcohol/week: 0.0 standard drinks   Drug use: No   Sexual activity: Never  Other Topics Concern   Not on file  Social  History Narrative   Not on file   Social Determinants of Health   Financial Resource Strain: Not on file  Food Insecurity: Not on file  Transportation Needs: Not on file  Physical Activity: Not on file  Stress: Not on file  Social Connections: Not on file  Intimate Partner Violence: Not on file     Review of Systems: Review of Systems  Constitutional: Negative.   HENT: Negative.    Eyes: Negative.   Respiratory:  Positive for cough, sputum production, shortness of breath and wheezing. Negative for hemoptysis.   Cardiovascular:  Positive for chest pain, orthopnea, leg swelling and PND. Negative for palpitations and claudication.  Gastrointestinal: Negative.   Genitourinary:  Negative for dysuria, flank pain, frequency, hematuria and urgency.  Musculoskeletal:  Negative for back pain, falls, joint pain, myalgias and neck pain.  Skin: Negative.   Neurological: Negative.   Endo/Heme/Allergies: Negative.   Psychiatric/Behavioral: Negative.     Vital Signs: Blood pressure (!) 154/60, pulse 88, temperature 98 F (36.7 C), resp. rate 20, height 5\' 4"  (1.626 m), weight (!) 162.8 kg, SpO2 94 %.  Weight trends: Filed Weights   07/15/21 0432 07/16/21 0308 07/17/21 0447  Weight: (!) 169 kg (!) 161.4 kg (!) 162.8 kg  Physical Exam: General: NAD, sitting up in bed.   Head: Normocephalic, atraumatic. Moist oral mucosal membranes  Eyes: Anicteric, PERRL  Neck: Supple, trachea midline  Lungs:  Bilateral wheezing, 4 L [place]  Heart: regular  Abdomen:  Soft, nontender, obese  Extremities: ++ peripheral edema.  Neurologic: Nonfocal, moving all four extremities  Skin: No lesions  Access: none     Lab results: Basic Metabolic Panel: Recent Labs  Lab 07/15/21 0436 07/16/21 0446 07/17/21 0645  NA 138 137 136  K 4.2 4.5 4.3  CL 96* 90* 91*  CO2 33* 32 31  GLUCOSE 103* 143* 107*  BUN 16 30* 41*  CREATININE 1.19* 1.67* 2.03*  CALCIUM 9.3 9.8 9.3  MG  --  2.0  --     Liver  Function Tests: No results for input(s): AST, ALT, ALKPHOS, BILITOT, PROT, ALBUMIN in the last 168 hours. No results for input(s): LIPASE, AMYLASE in the last 168 hours. No results for input(s): AMMONIA in the last 168 hours.  CBC: Recent Labs  Lab 07/15/21 0436 07/17/21 0645  WBC 9.8 12.5*  NEUTROABS 7.1  --   HGB 9.7* 8.8*  HCT 33.2* 29.2*  MCV 78.1* 76.0*  PLT 392 414*    Cardiac Enzymes: No results for input(s): CKTOTAL, CKMB, CKMBINDEX, TROPONINI in the last 168 hours.  BNP: Invalid input(s): POCBNP  CBG: No results for input(s): GLUCAP in the last 168 hours.  Microbiology: Results for orders placed or performed during the hospital encounter of 07/15/21  Resp Panel by RT-PCR (Flu A&B, Covid) Nasopharyngeal Swab     Status: None   Collection Time: 07/15/21  4:36 AM   Specimen: Nasopharyngeal Swab; Nasopharyngeal(NP) swabs in vial transport medium  Result Value Ref Range Status   SARS Coronavirus 2 by RT PCR NEGATIVE NEGATIVE Final    Comment: (NOTE) SARS-CoV-2 target nucleic acids are NOT DETECTED.  The SARS-CoV-2 RNA is generally detectable in upper respiratory specimens during the acute phase of infection. The lowest concentration of SARS-CoV-2 viral copies this assay can detect is 138 copies/mL. A negative result does not preclude SARS-Cov-2 infection and should not be used as the sole basis for treatment or other patient management decisions. A negative result may occur with  improper specimen collection/handling, submission of specimen other than nasopharyngeal swab, presence of viral mutation(s) within the areas targeted by this assay, and inadequate number of viral copies(<138 copies/mL). A negative result must be combined with clinical observations, patient history, and epidemiological information. The expected result is Negative.  Fact Sheet for Patients:  EntrepreneurPulse.com.au  Fact Sheet for Healthcare Providers:   IncredibleEmployment.be  This test is no t yet approved or cleared by the [place] FDA and  has been authorized for detection and/or diagnosis of SARS-CoV-2 by FDA under an Emergency Use Authorization (EUA). This EUA will remain  in effect (meaning this test can be used) for the duration of the COVID-19 declaration under Section 564(b)(1) of the Act, 21 U.S.C.section 360bbb-3(b)(1), unless the authorization is terminated  or revoked sooner.       Influenza A by PCR NEGATIVE NEGATIVE Final   Influenza B by PCR NEGATIVE NEGATIVE Final    Comment: (NOTE) The Xpert Xpress SARS-CoV-2/FLU/RSV plus assay is intended as an aid in the diagnosis of influenza from Nasopharyngeal swab specimens and should not be used as a sole basis for treatment. Nasal washings and aspirates are unacceptable for Xpert Xpress SARS-CoV-2/FLU/RSV testing.  Fact Sheet for Patients: EntrepreneurPulse.com.au  Fact Sheet for Healthcare Providers:  IncredibleEmployment.be  This test is not yet approved or cleared by the [place] and has been authorized for detection and/or diagnosis of SARS-CoV-2 by FDA under an Emergency Use Authorization (EUA). This EUA will remain in effect (meaning this test can be used) for the duration of the COVID-19 declaration under Section 564(b)(1) of the Act, 21 U.S.C. section 360bbb-3(b)(1), unless the authorization is terminated or revoked.  Performed at [facility], [place], [place] [address]   Respiratory (~20 pathogens) panel by PCR     Status: None   Collection Time: 07/15/21  6:00 PM   Specimen: Nasopharyngeal Swab; Respiratory  Result Value Ref Range Status   Adenovirus NOT DETECTED NOT DETECTED Final   Coronavirus 229E NOT DETECTED NOT DETECTED Final    Comment: (NOTE) The Coronavirus on the Respiratory Panel, DOES NOT test for the novel  Coronavirus (2019 nCoV)     Coronavirus HKU1 NOT DETECTED NOT DETECTED Final   Coronavirus NL63 NOT DETECTED NOT DETECTED Final   Coronavirus OC43 NOT DETECTED NOT DETECTED Final   Metapneumovirus NOT DETECTED NOT DETECTED Final   Rhinovirus / Enterovirus NOT DETECTED NOT DETECTED Final   Influenza A NOT DETECTED NOT DETECTED Final   Influenza B NOT DETECTED NOT DETECTED Final   Parainfluenza Virus 1 NOT DETECTED NOT DETECTED Final   Parainfluenza Virus 2 NOT DETECTED NOT DETECTED Final   Parainfluenza Virus 3 NOT DETECTED NOT DETECTED Final   Parainfluenza Virus 4 NOT DETECTED NOT DETECTED Final   Respiratory Syncytial Virus NOT DETECTED NOT DETECTED Final   Bordetella pertussis NOT DETECTED NOT DETECTED Final   Bordetella Parapertussis NOT DETECTED NOT DETECTED Final   Chlamydophila pneumoniae NOT DETECTED NOT DETECTED Final   Mycoplasma pneumoniae NOT DETECTED NOT DETECTED Final    Comment: Performed at [facility], [place]. [address], [place], [place] [address]    Coagulation Studies: No results for input(s): LABPROT, INR in the last 72 hours.  Urinalysis: Recent Labs    07/17/21 0945  COLORURINE YELLOW*  LABSPEC 1.011  PHURINE 5.0  GLUCOSEU NEGATIVE  HGBUR NEGATIVE  BILIRUBINUR NEGATIVE  KETONESUR NEGATIVE  PROTEINUR NEGATIVE  NITRITE NEGATIVE  LEUKOCYTESUR NEGATIVE      Imaging: US RENAL  Result Date: 07/17/2021 CLINICAL DATA:  Acute kidney injury EXAM: RENAL / URINARY TRACT ULTRASOUND COMPLETE COMPARISON:  CT abdomen pelvis 08/30/2010 FINDINGS: Right Kidney: Renal measurements: 10.3 x 4.4 x 3.8 cm = volume: 89 mL. Echogenicity within normal limits. No mass or hydronephrosis visualized. Mild perinephric effusion. Left Kidney: Renal measurements: 10.6 x 4.4 x 4.6 cm = volume: 122 mL. Echogenicity within normal limits. No mass or hydronephrosis visualized. Mild perinephric effusion Bladder: Appears normal for degree of bladder distention. Other: Image quality degraded by patient body habitus  IMPRESSION: Relatively small kidneys. No hydronephrosis. Bilateral perinephric effusions. Electronically Signed   By: Franchot Gallo M.D.   On: 07/17/2021 11:35   DG Chest Port 1 View  Result Date: 07/17/2021 CLINICAL DATA:  Chronic respiratory failure with hypoxia. EXAM: PORTABLE CHEST 1 VIEW COMPARISON:  July 15, 2021. FINDINGS: Stable cardiomegaly with central pulmonary vascular congestion. No pneumothorax or pleural effusion is noted. Bony thorax is unremarkable. IMPRESSION: Stable cardiomegaly with central pulmonary vascular congestion. Electronically Signed   By: Marijo Conception M.D.   On: 07/17/2021 08:49   ECHOCARDIOGRAM COMPLETE  Result Date: 07/16/2021    ECHOCARDIOGRAM REPORT   Patient Name:   Samantha Mason Date of Exam: 07/16/2021 Medical Rec #:  [id number]        Height:       64.0 in Accession #:    [id number]       Weight:       355.8 lb Date of Birth:  [birth year]       BSA:          2.499 m Patient Age:    22 years         BP:           165/75 mmHg Patient Gender: F                HR:           106 bpm. Exam Location:  [facility] Procedure: 2D Echo, Color Doppler, Cardiac Doppler and Intracardiac            Opacification Agent Indications:     I50.31 congestive heart failure-Acute Diastolic  History:         Patient has prior history of Echocardiogram examinations, most                  recent 08/31/2020. Stroke; Risk Factors:Sleep Apnea. Hx of                  asthma.  Sonographer:     Charmayne Sheer Referring Phys:  [place] Diagnosing Phys: Kathlyn Sacramento MD  Sonographer Comments: Technically difficult study due to poor echo windows. Image acquisition challenging due to patient body habitus. IMPRESSIONS  1. Left ventricular ejection fraction, by estimation, is 55 to 60%. The left ventricle has normal function. Left ventricular endocardial border not optimally defined to evaluate regional wall motion. There is mild left ventricular hypertrophy. Left ventricular diastolic parameters are  indeterminate.  2. Right ventricular systolic function is normal. The right ventricular size is normal. Tricuspid regurgitation signal is inadequate for assessing PA pressure.  3. The mitral valve is normal in structure. No evidence of mitral valve regurgitation. No evidence of mitral stenosis.  4. The aortic valve is normal in structure. Aortic valve regurgitation is not visualized. No aortic stenosis is present.  5. The inferior vena cava is normal in size with greater than 50% respiratory variability, suggesting right atrial pressure of 3 mmHg.  6. Challenging image quality. FINDINGS  Left Ventricle: Left ventricular ejection fraction, by estimation, is 55 to 60%. The left ventricle has normal function. Left ventricular endocardial border not optimally defined to evaluate regional wall motion. Definity contrast agent was given IV to delineate the left ventricular endocardial borders. The left ventricular internal cavity size was normal in size. There is mild left ventricular hypertrophy. Left ventricular diastolic parameters are indeterminate. Right Ventricle: The right ventricular size is normal. No increase in right ventricular wall thickness. Right ventricular systolic function is normal. Tricuspid regurgitation signal is inadequate for assessing PA pressure. Left Atrium: Left atrial size was normal in size. Right Atrium: Right atrial size was normal in size. Pericardium: There is no evidence of pericardial effusion. Mitral Valve: The mitral valve is normal in structure. No evidence of mitral valve regurgitation. No evidence of mitral valve stenosis. MV peak gradient, 13.2 mmHg. The mean mitral valve gradient is 7.0 mmHg. Tricuspid Valve: The tricuspid valve is normal in structure. Tricuspid valve regurgitation is not demonstrated. No evidence of tricuspid stenosis. Aortic Valve: The aortic valve is normal in structure. Aortic valve regurgitation is not visualized. No aortic stenosis is present. Aortic valve  mean gradient measures 6.0 mmHg. Aortic valve peak gradient measures  12.8 mmHg. Aortic valve area, by VTI measures 3.52 cm. Pulmonic Valve: The pulmonic valve was normal in structure. Pulmonic valve regurgitation is not visualized. No evidence of pulmonic stenosis. Aorta: The aortic root is normal in size and structure. Venous: The inferior vena cava is normal in size with greater than 50% respiratory variability, suggesting right atrial pressure of 3 mmHg. IAS/Shunts: No atrial level shunt detected by color flow Doppler.  LEFT VENTRICLE PLAX 2D LVIDd:         4.45 cm   Diastology LVIDs:         3.23 cm   LV e' medial:    6.42 cm/s LV PW:         1.45 cm   LV E/e' medial:  20.1 LV IVS:        1.09 cm   LV e' lateral:   10.30 cm/s LVOT diam:     2.40 cm   LV E/e' lateral: 12.5 LV SV:         97 LV SV Index:   39 LVOT Area:     4.52 cm  RIGHT VENTRICLE RV Basal diam:  4.16 cm LEFT ATRIUM           Index        RIGHT ATRIUM           Index LA diam:      3.90 cm 1.56 cm/m   RA Area:     18.50 cm LA Vol (A4C): 45.1 ml 18.05 ml/m  RA Volume:   57.70 ml  23.09 ml/m  AORTIC VALVE                     PULMONIC VALVE AV Area (Vmax):    2.86 cm      PV Vmax:       1.49 m/s AV Area (Vmean):   2.91 cm      PV Vmean:      99.500 cm/s AV Area (VTI):     3.52 cm      PV VTI:        0.302 m AV Vmax:           179.00 cm/s   PV Peak grad:  8.9 mmHg AV Vmean:          116.000 cm/s  PV Mean grad:  5.0 mmHg AV VTI:            0.275 m AV Peak Grad:      12.8 mmHg AV Mean Grad:      6.0 mmHg LVOT Vmax:         113.00 cm/s LVOT Vmean:        74.700 cm/s LVOT VTI:          0.214 m LVOT/AV VTI ratio: 0.78  AORTA Ao Root diam: 3.50 cm MITRAL VALVE MV Area (PHT): 12.86 cm    SHUNTS MV Area VTI:   3.35 cm     Systemic VTI:  0.21 m MV Peak grad:  13.2 mmHg    Systemic Diam: 2.40 cm MV Mean grad:  7.0 mmHg MV Vmax:       1.82 m/s MV Vmean:      129.0 cm/s MV Decel Time: 59 msec MV E velocity: 129.00 cm/s MV A velocity: 171.00 cm/s MV E/A  ratio:  0.75 Kathlyn Sacramento MD Electronically signed by Kathlyn Sacramento MD Signature Date/Time: 07/16/2021/3:27:04 PM    Final      Assessment & Plan: Ms.  Samantha Mason is a 71 y.o. white female with COPD/asthma, congestive heart failure, obstructive sleep apnea, IBS, hypertension, who was admitted to [facility] on 07/15/2021 for COPD exacerbation ([place]) [J44.1] Acute on chronic diastolic congestive heart failure (HCC) [I50.33] Hypertensive emergency [I16.1] Acute on chronic respiratory failure with hypoxia (HCC) [J96.21] Acute respiratory failure with hypoxia and hypercapnia (HCC) [J96.01, J96.02]  Acute kidney injury on chronic kidney disease stage IIIB: baseline creatinine of 1.36, GFR of 42 on 03/10/2021. History of bland urine. Chronic kidney disease secondary to hypertension. Acute kidney injury secondary to acute cardiorenal syndrome. No IV contrast exposure. Renal ultrasound with no obstruction.  - Increase furosemide to 60mg  IV - Salt restriction and fluid restriction - No acute indication for dialysis.  - holding home olmesartan  Acute exacerbation of chronic diastolic congestive heart failure: echocardiogram from this morning reviewed. Patient has failed outpatient therapy with PO furosemide.  - IV furosemide as above  Hypertension: 139/55. Home regimen includes olmesartan, furosemide PRN, and diltiazem.  - IV furosemide as above.  - Continue diltiazem - holding olmesartan as above.   Acute exacerbation of COPD: Continue supportive care     LOS: 2 Samantha Mason 2/28/20232:22 PM

## 2021-07-17 NOTE — Progress Notes (Signed)
Patient c/o increased SOB, RT given before 1700 PRN neb treatment per pt did not helped her but make it more worst in her breathing. Jackqulyn Livings, MD notified. Per Dr Loleta Books order RRT was called. Pt on BIPAP. Schedule lasix 60 mg IV was given.

## 2021-07-17 NOTE — Assessment & Plan Note (Addendum)
By day of discharge, creatinine 2.61.  Patient has been followed by nephrology during hospitalization.  They will follow-up with her as an outpatient.  Cozaar discontinued because of worsening renal function.

## 2021-07-18 DIAGNOSIS — I5033 Acute on chronic diastolic (congestive) heart failure: Secondary | ICD-10-CM | POA: Diagnosis not present

## 2021-07-18 DIAGNOSIS — N179 Acute kidney failure, unspecified: Secondary | ICD-10-CM

## 2021-07-18 DIAGNOSIS — I10 Essential (primary) hypertension: Secondary | ICD-10-CM | POA: Diagnosis not present

## 2021-07-18 DIAGNOSIS — J441 Chronic obstructive pulmonary disease with (acute) exacerbation: Secondary | ICD-10-CM | POA: Diagnosis not present

## 2021-07-18 DIAGNOSIS — J9621 Acute and chronic respiratory failure with hypoxia: Secondary | ICD-10-CM | POA: Diagnosis not present

## 2021-07-18 DIAGNOSIS — N189 Chronic kidney disease, unspecified: Secondary | ICD-10-CM

## 2021-07-18 LAB — PROTEIN ELECTRO, RANDOM URINE
Albumin ELP, Urine: 100 %
Alpha-1-Globulin, U: 0 %
Alpha-2-Globulin, U: 0 %
Beta Globulin, U: 0 %
Gamma Globulin, U: 0 %
Total Protein, Urine: 6.1 mg/dL

## 2021-07-18 LAB — BASIC METABOLIC PANEL
Anion gap: 10 (ref 5–15)
BUN: 45 mg/dL — ABNORMAL HIGH (ref 8–23)
CO2: 34 mmol/L — ABNORMAL HIGH (ref 22–32)
Calcium: 8.9 mg/dL (ref 8.9–10.3)
Chloride: 90 mmol/L — ABNORMAL LOW (ref 98–111)
Creatinine, Ser: 1.83 mg/dL — ABNORMAL HIGH (ref 0.44–1.00)
GFR, Estimated: 30 mL/min — ABNORMAL LOW (ref >60)
Glucose, Bld: 113 mg/dL — ABNORMAL HIGH (ref 70–99)
Potassium: 4.7 mmol/L (ref 3.5–5.1)
Sodium: 134 mmol/L — ABNORMAL LOW (ref 135–145)

## 2021-07-18 LAB — KAPPA/LAMBDA LIGHT CHAINS
Kappa free light chain: 31.9 mg/L — ABNORMAL HIGH (ref 3.3–19.4)
Kappa, lambda light chain ratio: 1.74 — ABNORMAL HIGH (ref 0.26–1.65)
Lambda free light chains: 18.3 mg/L (ref 5.7–26.3)

## 2021-07-18 LAB — ALBUMIN: Albumin: 3.4 g/dL — ABNORMAL LOW (ref 3.5–5.0)

## 2021-07-18 LAB — UREA NITROGEN, URINE: Urea Nitrogen, Ur: 433 mg/dL

## 2021-07-18 MED ORDER — ORAL CARE MOUTH RINSE
15.0000 mL | Freq: Two times a day (BID) | OROMUCOSAL | Status: DC
  Administered 2021-07-24 – 2021-07-26 (×6): 15 mL via OROMUCOSAL

## 2021-07-18 MED ORDER — CHLORHEXIDINE GLUCONATE 0.12 % MT SOLN
15.0000 mL | Freq: Two times a day (BID) | OROMUCOSAL | Status: DC
  Administered 2021-07-18 – 2021-07-27 (×19): 15 mL via OROMUCOSAL
  Filled 2021-07-18 (×18): qty 15

## 2021-07-18 MED ORDER — ROPINIROLE HCL 1 MG PO TABS
1.0000 mg | ORAL_TABLET | Freq: Three times a day (TID) | ORAL | Status: DC | PRN
  Administered 2021-07-18 – 2021-07-23 (×11): 1 mg via ORAL
  Filled 2021-07-18 (×12): qty 1

## 2021-07-18 NOTE — Progress Notes (Signed)
?Progress Note ? ? ?Patient: Samantha Mason IOX:[id number] DOB: 08-15-1950 DOA: 07/15/2021     3 ?DOS: the patient was seen and examined on 07/18/2021 ?  ?Brief hospital course: ?Mrs. Blunck is a 71 y.o. F with dCHF and COPD on 2L home O2, OSA on CPAP, anemia, IBS, morbid obesity, and HTN and CKD IIIb baseline 1.2-1.6 who presented with leg swelling for 2 weeks, then dyspnea for a few days and dry cough. ? ?In the ER, she was in severe respiratory distress, required BiPAP.  CXR with edema, BP 231/106, pCO2 67.  Was admitted and started on Lasix, steroids, bronchodilators. ? ? ?2/27: Started diuresis, Cr trending up  ?2/28: Cr worse, Nephrology consulted ? ?Assessment and Plan: ?* Acute on chronic respiratory failure with hypoxia ([place])- (present on admission) ?Patient required BiPAP again last night.  Initially required BiPAP for respiratory distress.  This morning was on 4 L of oxygen.  Hopefully can dial down to her usual 2 L. ? ?Acute on chronic diastolic CHF (congestive heart failure) ([place])- (present on admission) ?Continue Lasix 60 mg IV twice daily.  Daily weights.  Continue to monitor closely.  Salt restriction. ? ? ? ? ?COPD exacerbation ([place])- (present on admission) ?Steroids discontinued.  Likely CHF and not COPD. ? ?Acute kidney injury superimposed on chronic kidney disease ([place]) ?Creatinine 2.03 yesterday and down to 1.83 today.  Continue to monitor closely with diuresis ? ? ?Essential hypertension- (present on admission) ?Continue Cardizem CD and Lasix.  Initial blood pressure likely elevated secondary to not being able to breathe well. ? ?OSA on CPAP ?Patient's CPAP machine at home is not working.  Son to bring it into the oxygen company to try to get it working. ? ?Morbid obesity with BMI of 60.0-69.9, adult ([place]) ?BMI 61 ? ?Asthma with COPD ([place])- (present on admission) ? ? ? ?History of stroke ?LDL 68, defer statin ?- Continue low-dose aspirin ? ? ?Microcytic anemia- (present on admission) ?Patient  received IV iron yesterday.  Last hemoglobin 8.8. ? ? ? ? ? ? ? ?Subjective: Patient had shortness of breath again yesterday.  Required BiPAP overnight.  Still short of breath today.  Admitted with CHF. ? ?Physical Exam: ?Vitals:  ? 07/18/21 1058 07/18/21 1200 07/18/21 1500 07/18/21 1546  ?BP: (!) 140/55   (!) 161/70  ?Pulse: 86   85  ?Resp: 16 20    ?Temp: 97.9 ?F (36.6 ?C)   98 ?F (36.7 ?C)  ?TempSrc: Oral   Oral  ?SpO2: 97%  94% 94%  ?Weight:      ?Height:      ? ?Physical Exam ?HENT:  ?   Head: Normocephalic.  ?   Mouth/Throat:  ?   Pharynx: No oropharyngeal exudate.  ?Eyes:  ?   General: Lids are normal.  ?   Conjunctiva/sclera: Conjunctivae normal.  ?Cardiovascular:  ?   Rate and Rhythm: Normal rate and regular rhythm.  ?   Heart sounds: Normal heart sounds, S1 normal and S2 normal.  ?Pulmonary:  ?   Breath sounds: Examination of the right-lower field reveals decreased breath sounds and rales. Examination of the left-lower field reveals decreased breath sounds and rales. Decreased breath sounds and rales present. No wheezing or rhonchi.  ?Abdominal:  ?   Palpations: Abdomen is soft.  ?   Tenderness: There is no abdominal tenderness.  ?Musculoskeletal:  ?   Right lower leg: Swelling present.  ?   Left lower leg: Swelling present.  ?Skin: ?   General:  Skin is warm.  ?   Findings: No rash.  ?Neurological:  ?   Mental Status: She is alert and oriented to person, place, and time.  ?  ? ?Data Reviewed: ?Hemoglobin 8.8, creatinine 1.83 ?Reviewed laboratory radiological data from the hospital stay. ? ?Family Communication: Spoke with son at the bedside ? ?Disposition: ?Status is: Inpatient ?Remains inpatient appropriate because: Still needs aggressive diuresis. ? ?Planned Discharge Destination: Home ? ? ?Author: ?Loletha Grayer, MD ?07/18/2021 5:12 PM ? ?For on call review [email or web address].  ? ?

## 2021-07-18 NOTE — Care Management Important Message (Signed)
Important Message ? ?Patient Details  ?Name: Samantha Mason ?MRN: [id number] ?Date of Birth: 1951-04-21 ? ? ?Medicare Important Message Given:  Yes ? ? ? ? ?[name] ?07/18/2021, 11:50 AM ?

## 2021-07-18 NOTE — Progress Notes (Signed)
[facility]  ROUNDING NOTE   Subjective:   Patient seen sitting up in chair Family at bedside States she feels much improved today Remains on 4 L nasal cannula, denies shortness of breath Tolerating meals  Creatinine 1.83 Urine output recorded 2.95 L in preceding 24 hours  Objective:  Vital signs in last 24 hours:  Temp:  [97.7 F (36.5 C)-97.9 F (36.6 C)] 97.9 F (36.6 C) (03/01 1058) Pulse Rate:  [81-95] 86 (03/01 1058) Resp:  [16-23] 20 (03/01 1200) BP: (139-168)/(55-79) 140/55 (03/01 1058) SpO2:  [94 %-100 %] 97 % (03/01 1058) FiO2 (%):  [40 %] 40 % (03/01 0723) Weight:  [161.3 kg] 161.3 kg (03/01 0405)  Weight change: -1.497 kg Filed Weights   07/16/21 0308 07/17/21 0447 07/18/21 0405  Weight: (!) 161.4 kg (!) 162.8 kg (!) 161.3 kg    Intake/Output: I/O last 3 completed shifts: In: 360 [P.O.:360] Out: 3850 [Urine:3850]   Intake/Output this shift:  Total I/O In: -  Out: 900 [Urine:900]  Physical Exam: General: NAD  Head: Normocephalic, atraumatic. Moist oral mucosal membranes  Eyes: Anicteric  Lungs:  Mild Wheeze, normal effort  Heart: Regular rate and rhythm  Abdomen:  Soft, nontender  Extremities:  2+ peripheral edema.  Neurologic: Nonfocal, moving all four extremities  Skin: No lesions       Basic Metabolic Panel: Recent Labs  Lab 07/15/21 0436 07/16/21 0446 07/17/21 0645 07/18/21 0650  NA 138 137 136 134*  K 4.2 4.5 4.3 4.7  CL 96* 90* 91* 90*  CO2 33* 32 31 34*  GLUCOSE 103* 143* 107* 113*  BUN 16 30* 41* 45*  CREATININE 1.19* 1.67* 2.03* 1.83*  CALCIUM 9.3 9.8 9.3 8.9  MG  --  2.0  --   --     Liver Function Tests: Recent Labs  Lab 07/18/21 0650  ALBUMIN 3.4*   No results for input(s): LIPASE, AMYLASE in the last 168 hours. No results for input(s): AMMONIA in the last 168 hours.  CBC: Recent Labs  Lab 07/15/21 0436 07/17/21 0645  WBC 9.8 12.5*  NEUTROABS 7.1  --   HGB 9.7* 8.8*  HCT 33.2* 29.2*  MCV  78.1* 76.0*  PLT 392 414*    Cardiac Enzymes: No results for input(s): CKTOTAL, CKMB, CKMBINDEX, TROPONINI in the last 168 hours.  BNP: Invalid input(s): POCBNP  CBG: No results for input(s): GLUCAP in the last 168 hours.  Microbiology: Results for orders placed or performed during the hospital encounter of 07/15/21  Resp Panel by RT-PCR (Flu A&B, Covid) Nasopharyngeal Swab     Status: None   Collection Time: 07/15/21  4:36 AM   Specimen: Nasopharyngeal Swab; Nasopharyngeal(NP) swabs in vial transport medium  Result Value Ref Range Status   SARS Coronavirus 2 by RT PCR NEGATIVE NEGATIVE Final    Comment: (NOTE) SARS-CoV-2 target nucleic acids are NOT DETECTED.  The SARS-CoV-2 RNA is generally detectable in upper respiratory specimens during the acute phase of infection. The lowest concentration of SARS-CoV-2 viral copies this assay can detect is 138 copies/mL. A negative result does not preclude SARS-Cov-2 infection and should not be used as the sole basis for treatment or other patient management decisions. A negative result may occur with  improper specimen collection/handling, submission of specimen other than nasopharyngeal swab, presence of viral mutation(s) within the areas targeted by this assay, and inadequate number of viral copies(<138 copies/mL). A negative result must be combined with clinical observations, patient history, and epidemiological information. The expected  result is Negative.  Fact Sheet for Patients:  EntrepreneurPulse.com.au  Fact Sheet for Healthcare Providers:  IncredibleEmployment.be  This test is no t yet approved or cleared by the [place] FDA and  has been authorized for detection and/or diagnosis of SARS-CoV-2 by FDA under an Emergency Use Authorization (EUA). This EUA will remain  in effect (meaning this test can be used) for the duration of the COVID-19 declaration under Section 564(b)(1) of the  Act, 21 U.S.C.section 360bbb-3(b)(1), unless the authorization is terminated  or revoked sooner.       Influenza A by PCR NEGATIVE NEGATIVE Final   Influenza B by PCR NEGATIVE NEGATIVE Final    Comment: (NOTE) The Xpert Xpress SARS-CoV-2/FLU/RSV plus assay is intended as an aid in the diagnosis of influenza from Nasopharyngeal swab specimens and should not be used as a sole basis for treatment. Nasal washings and aspirates are unacceptable for Xpert Xpress SARS-CoV-2/FLU/RSV testing.  Fact Sheet for Patients: EntrepreneurPulse.com.au  Fact Sheet for Healthcare Providers: IncredibleEmployment.be  This test is not yet approved or cleared by the [place] FDA and has been authorized for detection and/or diagnosis of SARS-CoV-2 by FDA under an Emergency Use Authorization (EUA). This EUA will remain in effect (meaning this test can be used) for the duration of the COVID-19 declaration under Section 564(b)(1) of the Act, 21 U.S.C. section 360bbb-3(b)(1), unless the authorization is terminated or revoked.  Performed at [facility], [place], [place] [address]   Respiratory (~20 pathogens) panel by PCR     Status: None   Collection Time: 07/15/21  6:00 PM   Specimen: Nasopharyngeal Swab; Respiratory  Result Value Ref Range Status   Adenovirus NOT DETECTED NOT DETECTED Final   Coronavirus 229E NOT DETECTED NOT DETECTED Final    Comment: (NOTE) The Coronavirus on the Respiratory Panel, DOES NOT test for the novel  Coronavirus (2019 nCoV)    Coronavirus HKU1 NOT DETECTED NOT DETECTED Final   Coronavirus NL63 NOT DETECTED NOT DETECTED Final   Coronavirus OC43 NOT DETECTED NOT DETECTED Final   Metapneumovirus NOT DETECTED NOT DETECTED Final   Rhinovirus / Enterovirus NOT DETECTED NOT DETECTED Final   Influenza A NOT DETECTED NOT DETECTED Final   Influenza B NOT DETECTED NOT DETECTED Final   Parainfluenza Virus 1 NOT  DETECTED NOT DETECTED Final   Parainfluenza Virus 2 NOT DETECTED NOT DETECTED Final   Parainfluenza Virus 3 NOT DETECTED NOT DETECTED Final   Parainfluenza Virus 4 NOT DETECTED NOT DETECTED Final   Respiratory Syncytial Virus NOT DETECTED NOT DETECTED Final   Bordetella pertussis NOT DETECTED NOT DETECTED Final   Bordetella Parapertussis NOT DETECTED NOT DETECTED Final   Chlamydophila pneumoniae NOT DETECTED NOT DETECTED Final   Mycoplasma pneumoniae NOT DETECTED NOT DETECTED Final    Comment: Performed at [facility], [place]. [address] [place], [place] [address]    Coagulation Studies: No results for input(s): LABPROT, INR in the last 72 hours.  Urinalysis: Recent Labs    07/17/21 0945  COLORURINE YELLOW*  LABSPEC 1.011  PHURINE 5.0  GLUCOSEU NEGATIVE  HGBUR NEGATIVE  BILIRUBINUR NEGATIVE  KETONESUR NEGATIVE  PROTEINUR NEGATIVE  NITRITE NEGATIVE  LEUKOCYTESUR NEGATIVE      Imaging: US RENAL  Result Date: 07/17/2021 CLINICAL DATA:  Acute kidney injury EXAM: RENAL / URINARY TRACT ULTRASOUND COMPLETE COMPARISON:  CT abdomen pelvis 08/30/2010 FINDINGS: Right Kidney: Renal measurements: 10.3 x 4.4 x 3.8 cm = volume: 89 mL. Echogenicity within normal limits. No  mass or hydronephrosis visualized. Mild perinephric effusion. Left Kidney: Renal measurements: 10.6 x 4.4 x 4.6 cm = volume: 122 mL. Echogenicity within normal limits. No mass or hydronephrosis visualized. Mild perinephric effusion Bladder: Appears normal for degree of bladder distention. Other: Image quality degraded by patient body habitus IMPRESSION: Relatively small kidneys. No hydronephrosis. Bilateral perinephric effusions. Electronically Signed   By: Franchot Gallo M.D.   On: 07/17/2021 11:35   DG Chest Port 1 View  Result Date: 07/17/2021 CLINICAL DATA:  Chest pain and COPD EXAM: PORTABLE CHEST 1 VIEW COMPARISON:  07/17/2021 FINDINGS: Cardiac enlargement with mild vascular congestion. Negative for edema or  effusion. Elevated right hemidiaphragm with bibasilar atelectasis unchanged. IMPRESSION: Mild vascular congestion unchanged. Mild bibasilar atelectasis unchanged from earlier today. Electronically Signed   By: Franchot Gallo M.D.   On: 07/17/2021 17:57   DG Chest Port 1 View  Result Date: 07/17/2021 CLINICAL DATA:  Chronic respiratory failure with hypoxia. EXAM: PORTABLE CHEST 1 VIEW COMPARISON:  July 15, 2021. FINDINGS: Stable cardiomegaly with central pulmonary vascular congestion. No pneumothorax or pleural effusion is noted. Bony thorax is unremarkable. IMPRESSION: Stable cardiomegaly with central pulmonary vascular congestion. Electronically Signed   By: Marijo Conception M.D.   On: 07/17/2021 08:49     Medications:    ferric gluconate (FERRLECIT) IVPB 125 mg (07/18/21 1008)    aspirin EC  81 mg Oral Daily   cholestyramine  4 g Oral QHS   diltiazem  180 mg Oral Daily   enoxaparin (LOVENOX) injection  0.5 mg/kg Subcutaneous Q24H   furosemide  60 mg Intravenous BID   ipratropium-albuterol  3 mL Nebulization Q4H   loratadine  10 mg Oral Daily   pantoprazole  40 mg Oral Daily   acetaminophen **OR** acetaminophen, albuterol, dextromethorphan-guaiFENesin, hydrALAZINE, loperamide, ondansetron (ZOFRAN) IV, rOPINIRole  Assessment/ Plan:  Samantha Mason is a 72 y.o.  female with COPD/asthma, congestive heart failure, obstructive sleep apnea, IBS, hypertension, who was admitted to [facility] on 07/15/2021 for COPD exacerbation ([place]) [J44.1] Acute on chronic diastolic congestive heart failure ([facility]) [I50.33] Hypertensive emergency [I16.1] Acute on chronic respiratory failure with hypoxia ([facility]) [J96.21] Acute respiratory failure with hypoxia and hypercapnia ([facility]) [J96.01, J96.02]   Acute kidney injury on chronic kidney disease stage IIIB: baseline creatinine of 1.36, GFR of 42 on 03/10/2021. History of bland urine. Chronic kidney disease secondary to hypertension. Acute kidney injury secondary  to acute cardiorenal syndrome. No IV contrast exposure. Renal ultrasound with no obstruction.  - Continue furosemide 60mg  IV twice daily - Albumin 3.4.  - May consider transition to Bumex or torsemide.  - Continue salt restriction and fluid restriction - No acute indication for dialysis.  - holding home olmesartan  Lab Results  Component Value Date   CREATININE 1.83 (H) 07/18/2021   CREATININE 2.03 (H) 07/17/2021   CREATININE 1.67 (H) 07/16/2021    Intake/Output Summary (Last 24 hours) at 07/18/2021 1429 Last data filed at 07/18/2021 1347 Gross per 24 hour  Intake --  Output 3850 ml  Net -3850 ml   2.  Acute exacerbation of chronic diastolic congestive heart failure: echocardiogram from this morning reviewed. Patient has failed outpatient therapy with PO furosemide.  - Adequate diuresis, continue IV furosemide as above   Hypertension: 140/55 Home regimen includes olmesartan, furosemide PRN, and diltiazem.  - IV furosemide as above.  - Continue diltiazem - holding olmesartan as above.    Acute exacerbation of COPD: Continue supportive care and supplemental oxygen.  LOS: 3   3/1/20232:29 PM

## 2021-07-18 NOTE — Progress Notes (Signed)
?   07/18/21 1300  ?Clinical Encounter Type  ?Visited With Patient and family together  ?Visit Type Initial  ?Referral From Chaplain  ?Consult/Referral To Chaplain  ? ?Chaplain followed up from on call Chaplain. Chaplain provided emotional and spiritual support to patient and son. Patient appreciated [place] visit. ?

## 2021-07-18 NOTE — Plan of Care (Signed)
?  Problem: Education: ?Goal: Ability to demonstrate management of disease process will improve ?Outcome: Progressing ?Goal: Ability to verbalize understanding of medication therapies will improve ?Outcome: Progressing ?Goal: Individualized Educational Video(s) ?Outcome: Progressing ?  ?Problem: Activity: ?Goal: Capacity to carry out activities will improve ?Outcome: Progressing ?  ?Problem: Cardiac: ?Goal: Ability to achieve and maintain adequate cardiopulmonary perfusion will improve ?Outcome: Progressing ?  ?Problem: Education: ?Goal: Knowledge of disease or condition will improve ?Outcome: Progressing ?Goal: Knowledge of the prescribed therapeutic regimen will improve ?Outcome: Progressing ?Goal: Individualized Educational Video(s) ?Outcome: Progressing ?  ?Problem: Activity: ?Goal: Ability to tolerate increased activity will improve ?Outcome: Progressing ?Goal: Will verbalize the importance of balancing activity with adequate rest periods ?Outcome: Progressing ?  ?Problem: Respiratory: ?Goal: Ability to maintain a clear airway will improve ?Outcome: Progressing ?Goal: Levels of oxygenation will improve ?Outcome: Progressing ?Goal: Ability to maintain adequate ventilation will improve ?Outcome: Progressing ?  ?

## 2021-07-19 DIAGNOSIS — N179 Acute kidney failure, unspecified: Secondary | ICD-10-CM | POA: Diagnosis not present

## 2021-07-19 DIAGNOSIS — J9602 Acute respiratory failure with hypercapnia: Secondary | ICD-10-CM

## 2021-07-19 DIAGNOSIS — J9601 Acute respiratory failure with hypoxia: Secondary | ICD-10-CM | POA: Diagnosis not present

## 2021-07-19 DIAGNOSIS — J441 Chronic obstructive pulmonary disease with (acute) exacerbation: Secondary | ICD-10-CM | POA: Diagnosis not present

## 2021-07-19 DIAGNOSIS — I5A Non-ischemic myocardial injury (non-traumatic): Secondary | ICD-10-CM

## 2021-07-19 DIAGNOSIS — I5033 Acute on chronic diastolic (congestive) heart failure: Secondary | ICD-10-CM | POA: Diagnosis not present

## 2021-07-19 LAB — EXPECTORATED SPUTUM ASSESSMENT W GRAM STAIN, RFLX TO RESP C

## 2021-07-19 LAB — BASIC METABOLIC PANEL
Anion gap: 12 (ref 5–15)
BUN: 49 mg/dL — ABNORMAL HIGH (ref 8–23)
CO2: 35 mmol/L — ABNORMAL HIGH (ref 22–32)
Calcium: 9.1 mg/dL (ref 8.9–10.3)
Chloride: 89 mmol/L — ABNORMAL LOW (ref 98–111)
Creatinine, Ser: 1.86 mg/dL — ABNORMAL HIGH (ref 0.44–1.00)
GFR, Estimated: 29 mL/min — ABNORMAL LOW (ref >60)
Glucose, Bld: 109 mg/dL — ABNORMAL HIGH (ref 70–99)
Potassium: 4.2 mmol/L (ref 3.5–5.1)
Sodium: 136 mmol/L (ref 135–145)

## 2021-07-19 LAB — CBC
HCT: 31.1 % — ABNORMAL LOW (ref 36.0–46.0)
Hemoglobin: 9.1 g/dL — ABNORMAL LOW (ref 12.0–15.0)
MCH: 22.5 pg — ABNORMAL LOW (ref 26.0–34.0)
MCHC: 29.3 g/dL — ABNORMAL LOW (ref 30.0–36.0)
MCV: 76.8 fL — ABNORMAL LOW (ref 80.0–100.0)
Platelets: 397 10*3/uL (ref 150–400)
RBC: 4.05 MIL/uL (ref 3.87–5.11)
RDW: 16.3 % — ABNORMAL HIGH (ref 11.5–15.5)
WBC: 14.5 10*3/uL — ABNORMAL HIGH (ref 4.0–10.5)
nRBC: 0 % (ref 0.0–0.2)

## 2021-07-19 LAB — PROTEIN ELECTROPHORESIS, SERUM
A/G Ratio: 0.9 (ref 0.7–1.7)
Albumin ELP: 3.1 g/dL (ref 2.9–4.4)
Alpha-1-Globulin: 0.3 g/dL (ref 0.0–0.4)
Alpha-2-Globulin: 0.9 g/dL (ref 0.4–1.0)
Beta Globulin: 1.1 g/dL (ref 0.7–1.3)
Gamma Globulin: 1.1 g/dL (ref 0.4–1.8)
Globulin, Total: 3.4 g/dL (ref 2.2–3.9)
Total Protein ELP: 6.5 g/dL (ref 6.0–8.5)

## 2021-07-19 MED ORDER — METOLAZONE 5 MG PO TABS
5.0000 mg | ORAL_TABLET | Freq: Once | ORAL | Status: AC
Start: 2021-07-19 — End: 2021-07-19
  Administered 2021-07-19: 5 mg via ORAL
  Filled 2021-07-19: qty 1

## 2021-07-19 MED ORDER — IPRATROPIUM-ALBUTEROL 0.5-2.5 (3) MG/3ML IN SOLN
3.0000 mL | Freq: Four times a day (QID) | RESPIRATORY_TRACT | Status: DC
  Administered 2021-07-19 – 2021-07-22 (×13): 3 mL via RESPIRATORY_TRACT
  Filled 2021-07-19 (×13): qty 3

## 2021-07-19 NOTE — Progress Notes (Signed)
[facility]  ROUNDING NOTE   Subjective:   Patient seen sitting in chair Alert and oriented Bipap remains in place States she feels worse today compared to yesterday Tolerating meals Continues to complain of dry mouth  Creatinine 1.86 Urine output 2.75L in past 24 hours  Objective:  Vital signs in last 24 hours:  Temp:  [97.8 F (36.6 C)-98.4 F (36.9 C)] 98 F (36.7 C) (03/02 1112) Pulse Rate:  [81-92] 92 (03/02 1112) Resp:  [18-20] 20 (03/02 0410) BP: (139-161)/(63-72) 144/63 (03/02 1112) SpO2:  [94 %-100 %] 98 % (03/02 1112) FiO2 (%):  [40 %] 40 % (03/02 0410) Weight:  [163.5 kg] 163.5 kg (03/02 0500)  Weight change: 2.155 kg Filed Weights   07/17/21 0447 07/18/21 0405 07/19/21 0500  Weight: (!) 162.8 kg (!) 161.3 kg (!) 163.5 kg    Intake/Output: I/O last 3 completed shifts: In: 569.8 [P.O.:360; IV Piggyback:209.8] Out: 5000 [Urine:5000]   Intake/Output this shift:  No intake/output data recorded.  Physical Exam: General: NAD  Head: Normocephalic, atraumatic. Moist oral mucosal membranes  Eyes: Anicteric  Lungs:  Basilar crackles, normal effort  Heart: Regular rate and rhythm  Abdomen:  Soft, nontender  Extremities:  2+ peripheral edema.  Neurologic: Nonfocal, moving all four extremities  Skin: No lesions       Basic Metabolic Panel: Recent Labs  Lab 07/15/21 0436 07/16/21 0446 07/17/21 0645 07/18/21 0650 07/19/21 0629  NA 138 137 136 134* 136  K 4.2 4.5 4.3 4.7 4.2  CL 96* 90* 91* 90* 89*  CO2 33* 32 31 34* 35*  GLUCOSE 103* 143* 107* 113* 109*  BUN 16 30* 41* 45* 49*  CREATININE 1.19* 1.67* 2.03* 1.83* 1.86*  CALCIUM 9.3 9.8 9.3 8.9 9.1  MG  --  2.0  --   --   --      Liver Function Tests: Recent Labs  Lab 07/18/21 0650  ALBUMIN 3.4*    No results for input(s): LIPASE, AMYLASE in the last 168 hours. No results for input(s): AMMONIA in the last 168 hours.  CBC: Recent Labs  Lab 07/15/21 0436 07/17/21 0645  07/19/21 0629  WBC 9.8 12.5* 14.5*  NEUTROABS 7.1  --   --   HGB 9.7* 8.8* 9.1*  HCT 33.2* 29.2* 31.1*  MCV 78.1* 76.0* 76.8*  PLT 392 414* 397     Cardiac Enzymes: No results for input(s): CKTOTAL, CKMB, CKMBINDEX, TROPONINI in the last 168 hours.  BNP: Invalid input(s): POCBNP  CBG: No results for input(s): GLUCAP in the last 168 hours.  Microbiology: Results for orders placed or performed during the hospital encounter of 07/15/21  Resp Panel by RT-PCR (Flu A&B, Covid) Nasopharyngeal Swab     Status: None   Collection Time: 07/15/21  4:36 AM   Specimen: Nasopharyngeal Swab; Nasopharyngeal(NP) swabs in vial transport medium  Result Value Ref Range Status   SARS Coronavirus 2 by RT PCR NEGATIVE NEGATIVE Final    Comment: (NOTE) SARS-CoV-2 target nucleic acids are NOT DETECTED.  The SARS-CoV-2 RNA is generally detectable in upper respiratory specimens during the acute phase of infection. The lowest concentration of SARS-CoV-2 viral copies this assay can detect is 138 copies/mL. A negative result does not preclude SARS-Cov-2 infection and should not be used as the sole basis for treatment or other patient management decisions. A negative result may occur with  improper specimen collection/handling, submission of specimen other than nasopharyngeal swab, presence of viral mutation(s) within the areas targeted by this assay,  and inadequate number of viral copies(<138 copies/mL). A negative result must be combined with clinical observations, patient history, and epidemiological information. The expected result is Negative.  Fact Sheet for Patients:  EntrepreneurPulse.com.au  Fact Sheet for Healthcare Providers:  IncredibleEmployment.be  This test is no t yet approved or cleared by the [place] FDA and  has been authorized for detection and/or diagnosis of SARS-CoV-2 by FDA under an Emergency Use Authorization (EUA). This EUA will  remain  in effect (meaning this test can be used) for the duration of the COVID-19 declaration under Section 564(b)(1) of the Act, 21 U.S.C.section 360bbb-3(b)(1), unless the authorization is terminated  or revoked sooner.       Influenza A by PCR NEGATIVE NEGATIVE Final   Influenza B by PCR NEGATIVE NEGATIVE Final    Comment: (NOTE) The Xpert Xpress SARS-CoV-2/FLU/RSV plus assay is intended as an aid in the diagnosis of influenza from Nasopharyngeal swab specimens and should not be used as a sole basis for treatment. Nasal washings and aspirates are unacceptable for Xpert Xpress SARS-CoV-2/FLU/RSV testing.  Fact Sheet for Patients: EntrepreneurPulse.com.au  Fact Sheet for Healthcare Providers: IncredibleEmployment.be  This test is not yet approved or cleared by the [place] FDA and has been authorized for detection and/or diagnosis of SARS-CoV-2 by FDA under an Emergency Use Authorization (EUA). This EUA will remain in effect (meaning this test can be used) for the duration of the COVID-19 declaration under Section 564(b)(1) of the Act, 21 U.S.C. section 360bbb-3(b)(1), unless the authorization is terminated or revoked.  Performed at [facility], [place], [address] [address]   Respiratory (~20 pathogens) panel by PCR     Status: None   Collection Time: 07/15/21  6:00 PM   Specimen: Nasopharyngeal Swab; Respiratory  Result Value Ref Range Status   Adenovirus NOT DETECTED NOT DETECTED Final   Coronavirus 229E NOT DETECTED NOT DETECTED Final    Comment: (NOTE) The Coronavirus on the Respiratory Panel, DOES NOT test for the novel  Coronavirus (2019 nCoV)    Coronavirus HKU1 NOT DETECTED NOT DETECTED Final   Coronavirus NL63 NOT DETECTED NOT DETECTED Final   Coronavirus OC43 NOT DETECTED NOT DETECTED Final   Metapneumovirus NOT DETECTED NOT DETECTED Final   Rhinovirus / Enterovirus NOT DETECTED NOT DETECTED  Final   Influenza A NOT DETECTED NOT DETECTED Final   Influenza B NOT DETECTED NOT DETECTED Final   Parainfluenza Virus 1 NOT DETECTED NOT DETECTED Final   Parainfluenza Virus 2 NOT DETECTED NOT DETECTED Final   Parainfluenza Virus 3 NOT DETECTED NOT DETECTED Final   Parainfluenza Virus 4 NOT DETECTED NOT DETECTED Final   Respiratory Syncytial Virus NOT DETECTED NOT DETECTED Final   Bordetella pertussis NOT DETECTED NOT DETECTED Final   Bordetella Parapertussis NOT DETECTED NOT DETECTED Final   Chlamydophila pneumoniae NOT DETECTED NOT DETECTED Final   Mycoplasma pneumoniae NOT DETECTED NOT DETECTED Final    Comment: Performed at [facility], [place]. [address] [place], [place] [address]    Coagulation Studies: No results for input(s): LABPROT, INR in the last 72 hours.  Urinalysis: Recent Labs    07/17/21 0945  COLORURINE YELLOW*  LABSPEC 1.011  PHURINE 5.0  GLUCOSEU NEGATIVE  HGBUR NEGATIVE  BILIRUBINUR NEGATIVE  KETONESUR NEGATIVE  PROTEINUR NEGATIVE  NITRITE NEGATIVE  LEUKOCYTESUR NEGATIVE       Imaging: DG Chest Port 1 View  Result Date: 07/17/2021 CLINICAL DATA:  Chest pain and COPD EXAM: PORTABLE CHEST 1 VIEW  COMPARISON:  07/17/2021 FINDINGS: Cardiac enlargement with mild vascular congestion. Negative for edema or effusion. Elevated right hemidiaphragm with bibasilar atelectasis unchanged. IMPRESSION: Mild vascular congestion unchanged. Mild bibasilar atelectasis unchanged from earlier today. Electronically Signed   By: Franchot Gallo M.D.   On: 07/17/2021 17:57     Medications:    ferric gluconate (FERRLECIT) IVPB 125 mg (07/19/21 0844)    aspirin EC  81 mg Oral Daily   chlorhexidine  15 mL Mouth Rinse BID   cholestyramine  4 g Oral QHS   diltiazem  180 mg Oral Daily   enoxaparin (LOVENOX) injection  0.5 mg/kg Subcutaneous Q24H   furosemide  60 mg Intravenous BID   ipratropium-albuterol  3 mL Nebulization Q6H   loratadine  10 mg Oral Daily   mouth  rinse  15 mL Mouth Rinse q12n4p   pantoprazole  40 mg Oral Daily   acetaminophen **OR** acetaminophen, albuterol, dextromethorphan-guaiFENesin, hydrALAZINE, loperamide, ondansetron (ZOFRAN) IV, rOPINIRole  Assessment/ Plan:  Ms. JAYEL SCADUTO is a 71 y.o.  female with COPD/asthma, congestive heart failure, obstructive sleep apnea, IBS, hypertension, who was admitted to [facility] on 07/15/2021 for COPD exacerbation ([place]) [J44.1] Acute on chronic diastolic congestive heart failure (HCC) [I50.33] Hypertensive emergency [I16.1] Acute on chronic respiratory failure with hypoxia (HCC) [J96.21] Acute respiratory failure with hypoxia and hypercapnia (HCC) [J96.01, J96.02]   Acute kidney injury on chronic kidney disease stage IIIB: baseline creatinine of 1.36, GFR of 42 on 03/10/2021. History of bland urine. Chronic kidney disease secondary to hypertension. Acute kidney injury secondary to acute cardiorenal syndrome. No IV contrast exposure. Renal ultrasound with no obstruction.  - Continue furosemide 60mg  IV twice daily - Stable renal function and urine output -Will order Metolazone 5mg  once to optimize fluid removal - Will further reduce fluid restriction to 1558ml per day   Lab Results  Component Value Date   CREATININE 1.86 (H) 07/19/2021   CREATININE 1.83 (H) 07/18/2021   CREATININE 2.03 (H) 07/17/2021    Intake/Output Summary (Last 24 hours) at 07/19/2021 1223 Last data filed at 07/18/2021 2300 Gross per 24 hour  Intake 569.78 ml  Output 2750 ml  Net -2180.22 ml    2.  Acute exacerbation of chronic diastolic congestive heart failure: echocardiogram from this morning reviewed. Patient has failed outpatient therapy with PO furosemide.  - Continue IV furosemide    Hypertension: 144/63 Home regimen includes olmesartan, furosemide PRN, and diltiazem.  - IV furosemide as above.  - Continue diltiazem - holding olmesartan as above.    Acute exacerbation of COPD: Continue supportive care and  supplemental oxygen.      LOS: [place] 3/2/202312:23 PM

## 2021-07-19 NOTE — Progress Notes (Signed)
?Progress Note ? ? ?Patient: Samantha Mason XNA:[id number] DOB: 09-20-1950 DOA: 07/15/2021     4 ?DOS: the patient was seen and examined on 07/19/2021 ?  ? ?Assessment and Plan: ?* Acute on chronic respiratory failure with hypoxia (HCC) ?Again thePatient required BiPAP last night.  I will order this nightly.  Patient will be candidate for noninvasive ventilation at night.  Again this morning was on 4 L of oxygen.  Hopefully can dial down to her usual 2 L. ? ?Acute on chronic diastolic CHF (congestive heart failure) ([place]) ?Patient on Lasix 60 mg IV twice a day and given a dose of metolazone today.  If weight does not start coming down will consider Lasix drip for tomorrow. ? ? ? ? ?COPD exacerbation ([place]) ?Steroids discontinued.  Likely CHF and not COPD. ? ?Acute kidney injury superimposed on chronic kidney disease ([place]) ?Creatinine 2.03 on 07/17/2021 and down to 1.86 today.  Continue to monitor closely with diuresis ? ? ?Essential hypertension ?Continue Cardizem CD and Lasix.  Initial blood pressure likely elevated secondary to not being able to breathe well. ? ?Microcytic anemia ?Patient received IV iron 2 days ago.  Last hemoglobin 9.1. ? ?History of stroke ?LDL 68, defer statin ?- Continue low-dose aspirin ? ? ?Morbid obesity with BMI of 60.0-69.9, adult ([place]) ?BMI 61 ? ?Asthma with COPD ([place]) ? ? ? ?OSA on CPAP ?Patient is at home CPAP not working.  Patient likely a candidate for noninvasive ventilation at home.  We will have transitional care team look into this.  PCO2 67 and 61 on ABGs on 07/15/2021. ? ? ? ? ? ? ? ?Subjective: Patient still short of breath.  Difficulty getting around because of swelling.  States her legs were swollen this morning.  Still on more oxygen than she is on chronically.  Admitted with weight gain and acute on chronic hypoxic respiratory failure. ? ?Physical Exam: ?Vitals:  ? 07/19/21 0722 07/19/21 0830 07/19/21 1112 07/19/21 1328  ?BP: (!) 145/65  (!) 144/63   ?Pulse: 81  92   ?Resp:       ?Temp: 97.8 ?F (36.6 ?C)  98 ?F (36.7 ?C)   ?TempSrc: Oral  Oral   ?SpO2: 100% 95% 98% 95%  ?Weight:      ?Height:      ? ?Physical Exam ?HENT:  ?   Head: Normocephalic.  ?   Mouth/Throat:  ?   Pharynx: No oropharyngeal exudate.  ?Eyes:  ?   General: Lids are normal.  ?   Conjunctiva/sclera: Conjunctivae normal.  ?Cardiovascular:  ?   Rate and Rhythm: Normal rate and regular rhythm.  ?   Heart sounds: Normal heart sounds, S1 normal and S2 normal.  ?Pulmonary:  ?   Breath sounds: Examination of the right-lower field reveals decreased breath sounds. Examination of the left-lower field reveals decreased breath sounds. Decreased breath sounds present. No wheezing, rhonchi or rales.  ?Abdominal:  ?   Palpations: Abdomen is soft.  ?   Tenderness: There is no abdominal tenderness.  ?Musculoskeletal:  ?   Right lower leg: Swelling present.  ?   Left lower leg: Swelling present.  ?Skin: ?   General: Skin is warm.  ?   Findings: No rash.  ?Neurological:  ?   Mental Status: She is alert and oriented to person, place, and time.  ?  ? ?Data Reviewed: ?PCO2 earlier in the hospital stay elevated at 67.  Today's creatinine 1.86 ? ?Family Communication: Spoke with son yesterday ? ?Disposition: ?  Status is: Inpatient ?Remains inpatient appropriate because: Her weights are much higher than what she is at home.  Today's weight 163.5 kg ? ?Planned Discharge Destination: Home ? ?Author: ?Loletha Grayer, MD ?07/19/2021 3:11 PM ? ?For on call review [email or web address]  ? ?

## 2021-07-19 NOTE — Progress Notes (Addendum)
? ?  Heart Failure Nurse Navigator Note ? ?Met with patient today in addition to doing her own standing weights.  Discussed that she is keeping her intake and output, she has been reduced to 1500 mL.  Explained the laminated sheet and how to document.  She voices understanding. ? ?Pricilla Riffle RN [facility] ?

## 2021-07-20 DIAGNOSIS — N179 Acute kidney failure, unspecified: Secondary | ICD-10-CM | POA: Diagnosis not present

## 2021-07-20 DIAGNOSIS — I5033 Acute on chronic diastolic (congestive) heart failure: Secondary | ICD-10-CM | POA: Diagnosis not present

## 2021-07-20 DIAGNOSIS — J441 Chronic obstructive pulmonary disease with (acute) exacerbation: Secondary | ICD-10-CM | POA: Diagnosis not present

## 2021-07-20 DIAGNOSIS — J9621 Acute and chronic respiratory failure with hypoxia: Secondary | ICD-10-CM | POA: Diagnosis not present

## 2021-07-20 DIAGNOSIS — J9622 Acute and chronic respiratory failure with hypercapnia: Secondary | ICD-10-CM

## 2021-07-20 MED ORDER — METOLAZONE 5 MG PO TABS
5.0000 mg | ORAL_TABLET | Freq: Once | ORAL | Status: AC
  Administered 2021-07-20: 5 mg via ORAL
  Filled 2021-07-20: qty 1

## 2021-07-20 MED ORDER — NYSTATIN 100000 UNIT/GM EX POWD
Freq: Three times a day (TID) | CUTANEOUS | Status: DC
  Administered 2021-07-24 (×2): 1 via TOPICAL
  Filled 2021-07-20 (×2): qty 15

## 2021-07-20 MED ORDER — PROMETHAZINE HCL 25 MG PO TABS
25.0000 mg | ORAL_TABLET | Freq: Once | ORAL | Status: DC
  Filled 2021-07-20: qty 1

## 2021-07-20 MED ORDER — PROCHLORPERAZINE EDISYLATE 10 MG/2ML IJ SOLN
10.0000 mg | Freq: Once | INTRAMUSCULAR | Status: AC
  Administered 2021-07-20: 10 mg via INTRAVENOUS
  Filled 2021-07-20: qty 2

## 2021-07-20 NOTE — Progress Notes (Signed)
Sputum sample collected this shift was declared by lab as inadequate. New order has been placed to be collected during the day shift. ? ?Extreme lower extremity edema.  ? ?Patient is on IV Lasix. Urinary output documented on Flow sheets. ? ?Patient was given IV Ondasetron for Nausea. Patient expressed this was ineffective. Order for IV Compazine was administered. Patient states relief. ? ?Patient received her breathing treatments this shift. ?

## 2021-07-20 NOTE — Progress Notes (Signed)
?Progress Note ? ? ?Patient: Samantha Mason NAT:[id number] DOB: [birth year] DOA: 07/15/2021     5 ?DOS: the patient was seen and examined on 07/20/2021 ?  ? ?Assessment and Plan: ?* Acute on chronic respiratory failure with hypoxia and hypercapnia (HCC) ?Continue BiPAP nightly.  Number patient will be candidate for noninvasive ventilation at night.  Again this morning was on 4 L of oxygen.  Currently patient down to 2.5 L.  Hopefully can get down to her baseline 2 L per ? ?Acute on chronic diastolic CHF (congestive heart failure) ([place]) ?Patient on Lasix 60 mg IV twice a day and metolazone.  Good urine output yesterday.  Weight down to 345.29 pounds.  Patient's baseline weight around 325.  Still have some work to do here. ? ? ? ?COPD exacerbation ([place]) ?Likely CHF and not COPD. ? ?Acute kidney injury superimposed on chronic kidney disease ([place]) ?Creatinine 2.03 on 07/17/2021 and down to 1.86 yesterday.  Continue to monitor closely with diuresis.  Check BMP tomorrow ? ? ?Essential hypertension ?Continue Cardizem CD and Lasix.  Initial blood pressure likely elevated secondary to not being able to breathe well. ? ?Microcytic anemia ?Last hemoglobin 9.1.  Received IV iron during the hospital course ? ?History of stroke ?LDL 68, defer statin ?- Continue low-dose aspirin ? ? ?Morbid obesity with BMI of 60.0-69.9, adult ([place]) ?BMI 61 ? ?Asthma with COPD ([place]) ? ? ? ?OSA on CPAP ?Patient is at home CPAP not working.  Patient likely a candidate for noninvasive ventilation at home.  PCO2 67 and 61 on ABGs on 07/15/2021. ? ? ? ? ? ? ? ?Subjective: Patient stated she urinated a lot yesterday.  Still feels like her legs are still tight.  Breathing better than when she came in.  Using the BiPAP at night.  Admitted with acute on chronic hypoxic respiratory failure and CHF exacerbation ? ?Physical Exam: ?Vitals:  ? 07/20/21 0950 07/20/21 1145 07/20/21 1321 07/20/21 1608  ?BP:  (!) 139/56  (!) 139/46  ?Pulse:  82  82  ?Resp:  17  16   ?Temp:  98.4 ?F (36.9 ?C)  98.7 ?F (37.1 ?C)  ?TempSrc:      ?SpO2:  97% 97% 95%  ?Weight: (!) 156.9 kg     ?Height:      ? ?Physical Exam ?HENT:  ?   Head: Normocephalic.  ?   Mouth/Throat:  ?   Pharynx: No oropharyngeal exudate.  ?Eyes:  ?   General: Lids are normal.  ?   Conjunctiva/sclera: Conjunctivae normal.  ?Cardiovascular:  ?   Rate and Rhythm: Normal rate and regular rhythm.  ?   Heart sounds: Normal heart sounds, S1 normal and S2 normal.  ?Pulmonary:  ?   Breath sounds: Examination of the right-lower field reveals decreased breath sounds. Examination of the left-lower field reveals decreased breath sounds. Decreased breath sounds present. No wheezing, rhonchi or rales.  ?Abdominal:  ?   Palpations: Abdomen is soft.  ?   Tenderness: There is no abdominal tenderness.  ?Musculoskeletal:  ?   Right lower leg: Swelling present.  ?   Left lower leg: Swelling present.  ?Skin: ?   General: Skin is warm.  ?   Findings: No rash.  ?Neurological:  ?   Mental Status: She is alert and oriented to person, place, and time.  ?  ? ?Data Reviewed: ?I dialed down to 2.5 L with good saturations.  Weight down to 156.945 kg ? ?Disposition: ?Status is: Inpatient ?Remains inpatient  appropriate because: Patient still quite a bit up on her weights from her baseline. ? ?Planned Discharge Destination: Home ? ? ?Author: ?Loletha Grayer, MD ?07/20/2021 5:44 PM ? ?For on call review [email or web address].  ? ?

## 2021-07-20 NOTE — Progress Notes (Signed)
Patient complained of congestion. Mucinex given. ?

## 2021-07-20 NOTE — Progress Notes (Signed)
Patient requested Mucinex. Administered per orders. ?

## 2021-07-20 NOTE — Care Management Important Message (Signed)
Important Message ? ?Patient Details  ?Name: Samantha Mason ?MRN: [id number] ?Date of Birth: 1950-09-03 ? ? ?Medicare Important Message Given:  Yes ? ? ? ? ?[name] ?07/20/2021, 1:40 PM ?

## 2021-07-20 NOTE — Progress Notes (Signed)
[facility]  ROUNDING NOTE   Subjective:   Patient seen sitting up in chair, alert and oriented Reports nausea overnight, symptoms relieved with prescribed Phenergan Lower extremity and abdominal edema improved  Reported urine output of 5.2 L in 24 hours. Creatinine stable  Objective:  Vital signs in last 24 hours:  Temp:  [97.7 F (36.5 C)-98.5 F (36.9 C)] 98.4 F (36.9 C) (03/03 1145) Pulse Rate:  [82-92] 82 (03/03 1145) Resp:  [16-19] 17 (03/03 1145) BP: (114-157)/(56-101) 139/56 (03/03 1145) SpO2:  [95 %-99 %] 97 % (03/03 1321) FiO2 (%):  [40 %] 40 % (03/03 0400) Weight:  [156.9 kg-161.4 kg] 156.9 kg (03/03 0950)  Weight change: -4.832 kg Filed Weights   07/19/21 1536 07/20/21 0500 07/20/21 0950  Weight: (!) 158.7 kg (!) 161.4 kg (!) 156.9 kg    Intake/Output: I/O last 3 completed shifts: In: 467.6 [P.O.:360; IV Piggyback:107.6] Out: 6200 [Urine:6200]   Intake/Output this shift:  Total I/O In: 600 [P.O.:600] Out: 950 [Urine:950]  Physical Exam: General: NAD  Head: Normocephalic, atraumatic. Moist oral mucosal membranes  Eyes: Anicteric  Lungs:  Clear bilaterally, normal effort  Heart: Regular rate and rhythm  Abdomen:  Soft, nontender  Extremities:  2+ peripheral edema.  Neurologic: Nonfocal, moving all four extremities  Skin: No lesions       Basic Metabolic Panel: Recent Labs  Lab 07/15/21 0436 07/16/21 0446 07/17/21 0645 07/18/21 0650 07/19/21 0629  NA 138 137 136 134* 136  K 4.2 4.5 4.3 4.7 4.2  CL 96* 90* 91* 90* 89*  CO2 33* 32 31 34* 35*  GLUCOSE 103* 143* 107* 113* 109*  BUN 16 30* 41* 45* 49*  CREATININE 1.19* 1.67* 2.03* 1.83* 1.86*  CALCIUM 9.3 9.8 9.3 8.9 9.1  MG  --  2.0  --   --   --      Liver Function Tests: Recent Labs  Lab 07/18/21 0650  ALBUMIN 3.4*    No results for input(s): LIPASE, AMYLASE in the last 168 hours. No results for input(s): AMMONIA in the last 168 hours.  CBC: Recent Labs  Lab  07/15/21 0436 07/17/21 0645 07/19/21 0629  WBC 9.8 12.5* 14.5*  NEUTROABS 7.1  --   --   HGB 9.7* 8.8* 9.1*  HCT 33.2* 29.2* 31.1*  MCV 78.1* 76.0* 76.8*  PLT 392 414* 397     Cardiac Enzymes: No results for input(s): CKTOTAL, CKMB, CKMBINDEX, TROPONINI in the last 168 hours.  BNP: Invalid input(s): POCBNP  CBG: No results for input(s): GLUCAP in the last 168 hours.  Microbiology: Results for orders placed or performed during the hospital encounter of 07/15/21  Resp Panel by RT-PCR (Flu A&B, Covid) Nasopharyngeal Swab     Status: None   Collection Time: 07/15/21  4:36 AM   Specimen: Nasopharyngeal Swab; Nasopharyngeal(NP) swabs in vial transport medium  Result Value Ref Range Status   SARS Coronavirus 2 by RT PCR NEGATIVE NEGATIVE Final    Comment: (NOTE) SARS-CoV-2 target nucleic acids are NOT DETECTED.  The SARS-CoV-2 RNA is generally detectable in upper respiratory specimens during the acute phase of infection. The lowest concentration of SARS-CoV-2 viral copies this assay can detect is 138 copies/mL. A negative result does not preclude SARS-Cov-2 infection and should not be used as the sole basis for treatment or other patient management decisions. A negative result may occur with  improper specimen collection/handling, submission of specimen other than nasopharyngeal swab, presence of viral mutation(s) within the areas targeted by  this assay, and inadequate number of viral copies(<138 copies/mL). A negative result must be combined with clinical observations, patient history, and epidemiological information. The expected result is Negative.  Fact Sheet for Patients:  EntrepreneurPulse.com.au  Fact Sheet for Healthcare Providers:  IncredibleEmployment.be  This test is no t yet approved or cleared by the [place] FDA and  has been authorized for detection and/or diagnosis of SARS-CoV-2 by FDA under an Emergency Use  Authorization (EUA). This EUA will remain  in effect (meaning this test can be used) for the duration of the COVID-19 declaration under Section 564(b)(1) of the Act, 21 U.S.C.section 360bbb-3(b)(1), unless the authorization is terminated  or revoked sooner.       Influenza A by PCR NEGATIVE NEGATIVE Final   Influenza B by PCR NEGATIVE NEGATIVE Final    Comment: (NOTE) The Xpert Xpress SARS-CoV-2/FLU/RSV plus assay is intended as an aid in the diagnosis of influenza from Nasopharyngeal swab specimens and should not be used as a sole basis for treatment. Nasal washings and aspirates are unacceptable for Xpert Xpress SARS-CoV-2/FLU/RSV testing.  Fact Sheet for Patients: EntrepreneurPulse.com.au  Fact Sheet for Healthcare Providers: IncredibleEmployment.be  This test is not yet approved or cleared by the [place] FDA and has been authorized for detection and/or diagnosis of SARS-CoV-2 by FDA under an Emergency Use Authorization (EUA). This EUA will remain in effect (meaning this test can be used) for the duration of the COVID-19 declaration under Section 564(b)(1) of the Act, 21 U.S.C. section 360bbb-3(b)(1), unless the authorization is terminated or revoked.  Performed at [facility], [place], [place] [address]   Respiratory (~20 pathogens) panel by PCR     Status: None   Collection Time: 07/15/21  6:00 PM   Specimen: Nasopharyngeal Swab; Respiratory  Result Value Ref Range Status   Adenovirus NOT DETECTED NOT DETECTED Final   Coronavirus 229E NOT DETECTED NOT DETECTED Final    Comment: (NOTE) The Coronavirus on the Respiratory Panel, DOES NOT test for the novel  Coronavirus (2019 nCoV)    Coronavirus HKU1 NOT DETECTED NOT DETECTED Final   Coronavirus NL63 NOT DETECTED NOT DETECTED Final   Coronavirus OC43 NOT DETECTED NOT DETECTED Final   Metapneumovirus NOT DETECTED NOT DETECTED Final   Rhinovirus /  Enterovirus NOT DETECTED NOT DETECTED Final   Influenza A NOT DETECTED NOT DETECTED Final   Influenza B NOT DETECTED NOT DETECTED Final   Parainfluenza Virus 1 NOT DETECTED NOT DETECTED Final   Parainfluenza Virus 2 NOT DETECTED NOT DETECTED Final   Parainfluenza Virus 3 NOT DETECTED NOT DETECTED Final   Parainfluenza Virus 4 NOT DETECTED NOT DETECTED Final   Respiratory Syncytial Virus NOT DETECTED NOT DETECTED Final   Bordetella pertussis NOT DETECTED NOT DETECTED Final   Bordetella Parapertussis NOT DETECTED NOT DETECTED Final   Chlamydophila pneumoniae NOT DETECTED NOT DETECTED Final   Mycoplasma pneumoniae NOT DETECTED NOT DETECTED Final    Comment: Performed at [facility], [place]. [address], [place], [place] [address]  Expectorated Sputum Assessment w Gram Stain, Rflx to Resp Cult     Status: None   Collection Time: 07/19/21 10:45 PM   Specimen: Sputum  Result Value Ref Range Status   Specimen Description SPUTUM  Final   Special Requests NONE  Final   Sputum evaluation   Final    Sputum specimen not acceptable for testing.  Please recollect.   RESULT CALLED TO, READ BACK BY AND VERIFIED WITH: ROSE NGU @2342   ON 07/19/21 SKL Performed at [facility], [address] [place], [place] [address]    Report Status 07/19/2021 FINAL  Final    Coagulation Studies: No results for input(s): LABPROT, INR in the last 72 hours.  Urinalysis: No results for input(s): COLORURINE, LABSPEC, PHURINE, GLUCOSEU, HGBUR, BILIRUBINUR, KETONESUR, PROTEINUR, UROBILINOGEN, NITRITE, LEUKOCYTESUR in the last 72 hours.  Invalid input(s): APPERANCEUR     Imaging: No results found.   Medications:      aspirin EC  81 mg Oral Daily   chlorhexidine  15 mL Mouth Rinse BID   cholestyramine  4 g Oral QHS   diltiazem  180 mg Oral Daily   enoxaparin (LOVENOX) injection  0.5 mg/kg Subcutaneous Q24H   furosemide  60 mg Intravenous BID   ipratropium-albuterol  3 mL Nebulization Q6H    loratadine  10 mg Oral Daily   mouth rinse  15 mL Mouth Rinse q12n4p   nystatin   Topical TID   pantoprazole  40 mg Oral Daily   acetaminophen **OR** acetaminophen, albuterol, dextromethorphan-guaiFENesin, hydrALAZINE, loperamide, ondansetron (ZOFRAN) IV, rOPINIRole  Assessment/ Plan:  Ms. ANDREYA LACKS is a 71 y.o.  female with COPD/asthma, congestive heart failure, obstructive sleep apnea, IBS, hypertension, who was admitted to [name] on 07/15/2021 for COPD exacerbation ([place]) [J44.1] Acute on chronic diastolic congestive heart failure (HCC) [I50.33] Hypertensive emergency [I16.1] Acute on chronic respiratory failure with hypoxia (HCC) [J96.21] Acute respiratory failure with hypoxia and hypercapnia (HCC) [J96.01, J96.02]   Acute kidney injury on chronic kidney disease stage IIIB: baseline creatinine of 1.36, GFR of 42 on 03/10/2021. History of bland urine. Chronic kidney disease secondary to hypertension. Acute kidney injury secondary to acute cardiorenal syndrome. No IV contrast exposure. Renal ultrasound with no obstruction.  -We will prefer to continue furosemide 60 mg twice daily and lieu of transitioning to Lasix drip.  Good response made to metolazone yesterday, 5.2 L. -We will order additional dose of metolazone 5 mg today.  Lower extremity and abdominal edema improving.  Fluid remains however legs not as tight. -Encourage patient to continue fluid restriction  Lab Results  Component Value Date   CREATININE 1.86 (H) 07/19/2021   CREATININE 1.83 (H) 07/18/2021   CREATININE 2.03 (H) 07/17/2021    Intake/Output Summary (Last 24 hours) at 07/20/2021 1527 Last data filed at 07/20/2021 1400 Gross per 24 hour  Intake 707.63 ml  Output 6150 ml  Net -5442.37 ml    2.  Acute exacerbation of chronic diastolic congestive heart failure: echocardiogram from this morning reviewed. Patient has failed outpatient therapy with PO furosemide.  -  IV furosemide    Hypertension: 144/63 Home  regimen includes olmesartan, furosemide PRN, and diltiazem.  - IV furosemide as above.  - Continue diltiazem - holding olmesartan as above.    Acute exacerbation of COPD: Continue supportive care and supplemental oxygen.      LOS: 5   3/3/20233:27 PM

## 2021-07-20 NOTE — Progress Notes (Addendum)
? ?  Heart Failure Nurse Navigator Note ? ?Met with patient today.  She has continued to document her intake and output but her intake was not documented in computer.  Her documentation  showed she had 1880 mL in and 5200 mL out. ? ? ?Discussed  the importance of maintaining 1500 mL fluid restriction. ? ?Pricilla Riffle RN CHFN ?

## 2021-07-21 DIAGNOSIS — I5033 Acute on chronic diastolic (congestive) heart failure: Secondary | ICD-10-CM | POA: Diagnosis not present

## 2021-07-21 DIAGNOSIS — J9621 Acute and chronic respiratory failure with hypoxia: Secondary | ICD-10-CM | POA: Diagnosis not present

## 2021-07-21 DIAGNOSIS — J441 Chronic obstructive pulmonary disease with (acute) exacerbation: Secondary | ICD-10-CM | POA: Diagnosis not present

## 2021-07-21 DIAGNOSIS — N179 Acute kidney failure, unspecified: Secondary | ICD-10-CM | POA: Diagnosis not present

## 2021-07-21 LAB — CBC
HCT: 30.2 % — ABNORMAL LOW (ref 36.0–46.0)
Hemoglobin: 9.1 g/dL — ABNORMAL LOW (ref 12.0–15.0)
MCH: 23 pg — ABNORMAL LOW (ref 26.0–34.0)
MCHC: 30.1 g/dL (ref 30.0–36.0)
MCV: 76.5 fL — ABNORMAL LOW (ref 80.0–100.0)
Platelets: 316 10*3/uL (ref 150–400)
RBC: 3.95 MIL/uL (ref 3.87–5.11)
RDW: 16.2 % — ABNORMAL HIGH (ref 11.5–15.5)
WBC: 12.7 10*3/uL — ABNORMAL HIGH (ref 4.0–10.5)
nRBC: 0 % (ref 0.0–0.2)

## 2021-07-21 LAB — BASIC METABOLIC PANEL
Anion gap: 11 (ref 5–15)
BUN: 7 mg/dL — ABNORMAL LOW (ref 8–23)
CO2: 41 mmol/L — ABNORMAL HIGH (ref 22–32)
Calcium: 9.1 mg/dL (ref 8.9–10.3)
Chloride: 85 mmol/L — ABNORMAL LOW (ref 98–111)
Creatinine, Ser: 2 mg/dL — ABNORMAL HIGH (ref 0.44–1.00)
GFR, Estimated: 27 mL/min — ABNORMAL LOW (ref >60)
Glucose, Bld: 118 mg/dL — ABNORMAL HIGH (ref 70–99)
Potassium: 4 mmol/L (ref 3.5–5.1)
Sodium: 137 mmol/L (ref 135–145)

## 2021-07-21 MED ORDER — SODIUM CHLORIDE 0.9 % IV SOLN
12.5000 mg | Freq: Once | INTRAVENOUS | Status: DC
  Filled 2021-07-21: qty 0.5

## 2021-07-21 NOTE — Progress Notes (Signed)
NSR on  monitor. HR 92 ?

## 2021-07-21 NOTE — Progress Notes (Signed)
?Progress Note ? ? ?Patient: Samantha Mason MHD:[id number] DOB: 29-Jan-1951 DOA: 07/15/2021     6 ?DOS: the patient was seen and examined on 07/21/2021 ?  ? ? ?Assessment and Plan: ?* Acute on chronic respiratory failure with hypoxia and hypercapnia (HCC) ?Continue BiPAP nightly.  She is a candidate for noninvasive ventilation at night.  Spoke with transitional care team to see if we can set up noninvasive ventilation at night.  This morning she was on 2.5 L.  I dialed her down to 2 L. ? ?Acute on chronic diastolic CHF (congestive heart failure) ([place]) ?Patient on Lasix 60 mg IV twice a day and metolazone.  Good urine output again.  Weight down to 341 pounds.  Patient's baseline weight around 322.  Still have more diuresis to do here in the hospital. ? ? ? ?COPD exacerbation ([place]) ?Likely CHF and not COPD. ? ?Acute kidney injury superimposed on chronic kidney disease ([place]) ?Creatinine 2.03 on 07/17/2021 and slightly worsened from yesterday to 2.0 today.  Continue to monitor closely with diuresis.  Check BMP tomorrow.  Underlying chronic kidney disease stage IV ? ? ?Essential hypertension ?Continue Cardizem CD and Lasix.  Initial blood pressure likely elevated secondary to not being able to breathe well. ? ?Microcytic anemia ?Last hemoglobin 9.1.  Received IV iron during the hospital course ? ?History of stroke ?LDL 68, Continue low-dose aspirin ? ? ?Morbid obesity with BMI of 60.0-69.9, adult ([place]) ?BMI 61 ? ?Asthma with COPD ([place]) ? ? ? ?OSA on CPAP ?Patient is at home CPAP not working.  Spoke with TOC about setting up noninvasive ventilation at night.  PCO2 67 and 61 on ABGs on 07/15/2021. ? ? ? ? ? ? ? ?Subjective: Patient starting to feel little bit better.  Weight down to 341 pounds today.  Baseline weight 322 pounds.  Urinating very well.  Breathing is a little bit better. ? ?Physical Exam: ?Vitals:  ? 07/21/21 0500 07/21/21 0730 07/21/21 0758 07/21/21 1306  ?BP:  (!) 129/54    ?Pulse:  78    ?Resp:  15    ?Temp:   98.3 ?F (36.8 ?C)    ?TempSrc:      ?SpO2:  98% 98% 98%  ?Weight: (!) 155.3 kg     ?Height:      ? ?Physical Exam ?HENT:  ?   Head: Normocephalic.  ?   Mouth/Throat:  ?   Pharynx: No oropharyngeal exudate.  ?Eyes:  ?   General: Lids are normal.  ?   Conjunctiva/sclera: Conjunctivae normal.  ?Cardiovascular:  ?   Rate and Rhythm: Normal rate and regular rhythm.  ?   Heart sounds: Normal heart sounds, S1 normal and S2 normal.  ?Pulmonary:  ?   Breath sounds: Examination of the right-lower field reveals decreased breath sounds. Examination of the left-lower field reveals decreased breath sounds. Decreased breath sounds present. No wheezing, rhonchi or rales.  ?Abdominal:  ?   Palpations: Abdomen is soft.  ?   Tenderness: There is no abdominal tenderness.  ?Musculoskeletal:  ?   Right lower leg: Swelling present.  ?   Left lower leg: Swelling present.  ?Skin: ?   General: Skin is warm.  ?   Findings: No rash.  ?Neurological:  ?   Mental Status: She is alert and oriented to person, place, and time.  ?  ? ?Data Reviewed: ?Last hemoglobin 9.1, creatinine 2.0 ? ? ?Disposition: ?Status is: Inpatient ?Remains inpatient appropriate because: Still quite a bit higher than her  baseline weight.  Needs IV Lasix and metolazone. ? ?Planned Discharge Destination: Home ? ?Author: ?Loletha Grayer, MD ?07/21/2021 2:54 PM ? ?For on call review [email or web address].  ? ?

## 2021-07-21 NOTE — Progress Notes (Signed)
Nystatin powder applied to skin under abdomen and behind both knees. ?

## 2021-07-21 NOTE — TOC Progression Note (Signed)
Transition of Care (TOC) - Progression Note  ? ? ?Patient Details  ?Name: Samantha Mason ?MRN: [id number] ?Date of Birth: Oct 16, 1950 ? ?Transition of Care (TOC) CM/SW Contact  ?Harriet Masson, RN ?Phone Number:[phone] ?07/21/2021, 1:24 PM ? ?Clinical Narrative:    ?Spoke with pt concerning recent recommendations for a Trilogy respirator. Pt receptive to [employer] for DME. Referral made to [facility] [place] for [facility] to intervene. Note pt has a CPAP and home in the home already. Pt has a very supportive family, able to afford all medications and sufficient transportation to all medical appointments post- discharge. No other needs presented at this time. ? ?TOC will continue to address any discharge needs accordingly. ? ?  ?  ? ?Expected Discharge Plan and Services ?  ?  ?  ?  ?  ?                ?  ?  ?  ?  ?  ?  ?  ?  ?  ?  ? ? ?Social Determinants of Health (SDOH) Interventions ?  ? ?Readmission Risk Interventions ?No flowsheet data found. ? ?

## 2021-07-21 NOTE — Progress Notes (Signed)
[facility]  ROUNDING NOTE   Subjective:   Patient seen today on second floor Patient resting comfortably on the bed Patient offers no new specific physical complaints    Objective:  Vital signs in last 24 hours:  Temp:  [98.2 F (36.8 C)-98.9 F (37.2 C)] 98.3 F (36.8 C) (03/04 0730) Pulse Rate:  [78-87] 78 (03/04 0730) Resp:  [15-17] 15 (03/04 0730) BP: (125-139)/(46-60) 129/54 (03/04 0730) SpO2:  [94 %-98 %] 98 % (03/04 1306) FiO2 (%):  [40 %] 40 % (03/04 0410) Weight:  [155.3 kg] 155.3 kg (03/04 0500)  Weight change: -1.724 kg Filed Weights   07/20/21 0500 07/20/21 0950 07/21/21 0500  Weight: (!) 161.4 kg (!) 156.9 kg (!) 155.3 kg    Intake/Output: I/O last 3 completed shifts: In: 600 [P.O.:600] Out: 4800 [Urine:4800]   Intake/Output this shift:  Total I/O In: 240 [P.O.:240] Out: 450 [Urine:450]  Physical Exam: General: NAD  Head: Normocephalic, atraumatic. Moist oral mucosal membranes, Cpap in situ  Eyes: Anicteric  Lungs:  Clear bilaterally, normal effort  Heart: Regular rate and rhythm  Abdomen:  Soft, nontender  Extremities:  2+ peripheral edema.  Neurologic: Nonfocal, moving all four extremities  Skin: No lesions       Basic Metabolic Panel: Recent Labs  Lab 07/16/21 0446 07/17/21 0645 07/18/21 0650 07/19/21 0629 07/21/21 0542  NA 137 136 134* 136 137  K 4.5 4.3 4.7 4.2 4.0  CL 90* 91* 90* 89* 85*  CO2 32 31 34* 35* 41*  GLUCOSE 143* 107* 113* 109* 118*  BUN 30* 41* 45* 49* 7*  CREATININE 1.67* 2.03* 1.83* 1.86* 2.00*  CALCIUM 9.8 9.3 8.9 9.1 9.1  MG 2.0  --   --   --   --     Liver Function Tests: Recent Labs  Lab 07/18/21 0650  ALBUMIN 3.4*   No results for input(s): LIPASE, AMYLASE in the last 168 hours. No results for input(s): AMMONIA in the last 168 hours.  CBC: Recent Labs  Lab 07/15/21 0436 07/17/21 0645 07/19/21 0629 07/21/21 0542  WBC 9.8 12.5* 14.5* 12.7*  NEUTROABS 7.1  --   --   --   HGB  9.7* 8.8* 9.1* 9.1*  HCT 33.2* 29.2* 31.1* 30.2*  MCV 78.1* 76.0* 76.8* 76.5*  PLT 392 414* 397 316    Cardiac Enzymes: No results for input(s): CKTOTAL, CKMB, CKMBINDEX, TROPONINI in the last 168 hours.  BNP: Invalid input(s): POCBNP  CBG: No results for input(s): GLUCAP in the last 168 hours.  Microbiology: Results for orders placed or performed during the hospital encounter of 07/15/21  Resp Panel by RT-PCR (Flu A&B, Covid) Nasopharyngeal Swab     Status: None   Collection Time: 07/15/21  4:36 AM   Specimen: Nasopharyngeal Swab; Nasopharyngeal(NP) swabs in vial transport medium  Result Value Ref Range Status   SARS Coronavirus 2 by RT PCR NEGATIVE NEGATIVE Final    Comment: (NOTE) SARS-CoV-2 target nucleic acids are NOT DETECTED.  The SARS-CoV-2 RNA is generally detectable in upper respiratory specimens during the acute phase of infection. The lowest concentration of SARS-CoV-2 viral copies this assay can detect is 138 copies/mL. A negative result does not preclude SARS-Cov-2 infection and should not be used as the sole basis for treatment or other patient management decisions. A negative result may occur with  improper specimen collection/handling, submission of specimen other than nasopharyngeal swab, presence of viral mutation(s) within the areas targeted by this assay, and inadequate number of viral  copies(<138 copies/mL). A negative result must be combined with clinical observations, patient history, and epidemiological information. The expected result is Negative.  Fact Sheet for Patients:  EntrepreneurPulse.com.au  Fact Sheet for Healthcare Providers:  IncredibleEmployment.be  This test is no t yet approved or cleared by the [place] FDA and  has been authorized for detection and/or diagnosis of SARS-CoV-2 by FDA under an Emergency Use Authorization (EUA). This EUA will remain  in effect (meaning this test can be used)  for the duration of the COVID-19 declaration under Section 564(b)(1) of the Act, 21 U.S.C.section 360bbb-3(b)(1), unless the authorization is terminated  or revoked sooner.       Influenza A by PCR NEGATIVE NEGATIVE Final   Influenza B by PCR NEGATIVE NEGATIVE Final    Comment: (NOTE) The Xpert Xpress SARS-CoV-2/FLU/RSV plus assay is intended as an aid in the diagnosis of influenza from Nasopharyngeal swab specimens and should not be used as a sole basis for treatment. Nasal washings and aspirates are unacceptable for Xpert Xpress SARS-CoV-2/FLU/RSV testing.  Fact Sheet for Patients: EntrepreneurPulse.com.au  Fact Sheet for Healthcare Providers: IncredibleEmployment.be  This test is not yet approved or cleared by the [place] FDA and has been authorized for detection and/or diagnosis of SARS-CoV-2 by FDA under an Emergency Use Authorization (EUA). This EUA will remain in effect (meaning this test can be used) for the duration of the COVID-19 declaration under Section 564(b)(1) of the Act, 21 U.S.C. section 360bbb-3(b)(1), unless the authorization is terminated or revoked.  Performed at [facility], [place], [place] [address]   Respiratory (~20 pathogens) panel by PCR     Status: None   Collection Time: 07/15/21  6:00 PM   Specimen: Nasopharyngeal Swab; Respiratory  Result Value Ref Range Status   Adenovirus NOT DETECTED NOT DETECTED Final   Coronavirus 229E NOT DETECTED NOT DETECTED Final    Comment: (NOTE) The Coronavirus on the Respiratory Panel, DOES NOT test for the novel  Coronavirus (2019 nCoV)    Coronavirus HKU1 NOT DETECTED NOT DETECTED Final   Coronavirus NL63 NOT DETECTED NOT DETECTED Final   Coronavirus OC43 NOT DETECTED NOT DETECTED Final   Metapneumovirus NOT DETECTED NOT DETECTED Final   Rhinovirus / Enterovirus NOT DETECTED NOT DETECTED Final   Influenza A NOT DETECTED NOT DETECTED Final    Influenza B NOT DETECTED NOT DETECTED Final   Parainfluenza Virus 1 NOT DETECTED NOT DETECTED Final   Parainfluenza Virus 2 NOT DETECTED NOT DETECTED Final   Parainfluenza Virus 3 NOT DETECTED NOT DETECTED Final   Parainfluenza Virus 4 NOT DETECTED NOT DETECTED Final   Respiratory Syncytial Virus NOT DETECTED NOT DETECTED Final   Bordetella pertussis NOT DETECTED NOT DETECTED Final   Bordetella Parapertussis NOT DETECTED NOT DETECTED Final   Chlamydophila pneumoniae NOT DETECTED NOT DETECTED Final   Mycoplasma pneumoniae NOT DETECTED NOT DETECTED Final    Comment: Performed at [facility], [place]. [address] [place], [place] [address]  Expectorated Sputum Assessment w Gram Stain, Rflx to Resp Cult     Status: None   Collection Time: 07/19/21 10:45 PM   Specimen: Sputum  Result Value Ref Range Status   Specimen Description SPUTUM  Final   Special Requests NONE  Final   Sputum evaluation   Final    Sputum specimen not acceptable for testing.  Please recollect.   RESULT CALLED TO, READ BACK BY AND VERIFIED WITH: ROSE NGU '@[id number]'$  ON 07/19/21 SKL Performed at [facility]  Lab, [address] [place], [place] [address]    Report Status 07/19/2021 FINAL  Final    Coagulation Studies: No results for input(s): LABPROT, INR in the last 72 hours.  Urinalysis: No results for input(s): COLORURINE, LABSPEC, PHURINE, GLUCOSEU, HGBUR, BILIRUBINUR, KETONESUR, PROTEINUR, UROBILINOGEN, NITRITE, LEUKOCYTESUR in the last 72 hours.  Invalid input(s): APPERANCEUR     Imaging: No results found.   Medications:      aspirin EC  81 mg Oral Daily   chlorhexidine  15 mL Mouth Rinse BID   cholestyramine  4 g Oral QHS   diltiazem  180 mg Oral Daily   enoxaparin (LOVENOX) injection  0.5 mg/kg Subcutaneous Q24H   furosemide  60 mg Intravenous BID   ipratropium-albuterol  3 mL Nebulization Q6H   loratadine  10 mg Oral Daily   mouth rinse  15 mL Mouth Rinse q12n4p   nystatin   Topical TID    pantoprazole  40 mg Oral Daily   acetaminophen **OR** acetaminophen, albuterol, dextromethorphan-guaiFENesin, hydrALAZINE, loperamide, ondansetron (ZOFRAN) IV, rOPINIRole  Assessment/ Plan:  Ms. KATHLYN LEACHMAN is a 71 y.o.  female with COPD/asthma, congestive heart failure, obstructive sleep apnea, IBS, hypertension, who was admitted to [facility] on 07/15/2021 for COPD exacerbation ([place]) [J44.1] Acute on chronic diastolic congestive heart failure (HCC) [I50.33] Hypertensive emergency [I16.1] Acute on chronic respiratory failure with hypoxia (HCC) [J96.21] Acute respiratory failure with hypoxia and hypercapnia (HCC) [J96.01, J96.02]   Acute kidney injury on chronic kidney disease stage IIIB: baseline creatinine of 1.36, GFR of 42 on 03/10/2021. History of bland urine. Chronic kidney disease secondary to hypertension. Acute kidney injury secondary to acute cardiorenal syndrome. No IV contrast exposure. Renal ultrasound with no obstruction.  -We will  continue furosemide 60 mg twice daily and lieu of transitioning to Lasix drip.     On  metolazone  -We will order additional dose of metolazone 5 mg today.   Lower extremity and abdominal edema improving.  -Encourage patient to continue fluid restriction  Lab Results  Component Value Date   CREATININE 2.00 (H) 07/21/2021   CREATININE 1.86 (H) 07/19/2021   CREATININE 1.83 (H) 07/18/2021    Intake/Output Summary (Last 24 hours) at 07/21/2021 1502 Last data filed at 07/21/2021 1406 Gross per 24 hour  Intake 240 ml  Output 2400 ml  Net -2160 ml   2.  Acute on chronic diastolic congestive heart failure: echocardiogram riewed. Patient has failed outpatient therapy with PO furosemide.  -  Patient now negative by approximately 13 L since admission -Patient remains on IV Lasix  Hypertension:Home regimen includes olmesartan, furosemide PRN, and diltiazem.  - IV furosemide as above.  - Continue diltiazem - holding olmesartan as above.    Acute  exacerbation of COPD: Continue supportive care and supplemental oxygen.  5. Anemia of chronic disease And iron deficiency anemia  patient did receive IV iron during the hospital admission Patient hemoglobin is trending up      LOS: 6 Denishia Citro s [facility] 3/4/20233:02 PM

## 2021-07-22 DIAGNOSIS — J9621 Acute and chronic respiratory failure with hypoxia: Secondary | ICD-10-CM | POA: Diagnosis not present

## 2021-07-22 DIAGNOSIS — N179 Acute kidney failure, unspecified: Secondary | ICD-10-CM | POA: Diagnosis not present

## 2021-07-22 DIAGNOSIS — G4700 Insomnia, unspecified: Secondary | ICD-10-CM

## 2021-07-22 DIAGNOSIS — I5033 Acute on chronic diastolic (congestive) heart failure: Secondary | ICD-10-CM | POA: Diagnosis not present

## 2021-07-22 DIAGNOSIS — J441 Chronic obstructive pulmonary disease with (acute) exacerbation: Secondary | ICD-10-CM | POA: Diagnosis not present

## 2021-07-22 LAB — BASIC METABOLIC PANEL
Anion gap: 15 (ref 5–15)
BUN: 60 mg/dL — ABNORMAL HIGH (ref 8–23)
CO2: 41 mmol/L — ABNORMAL HIGH (ref 22–32)
Calcium: 9.4 mg/dL (ref 8.9–10.3)
Chloride: 77 mmol/L — ABNORMAL LOW (ref 98–111)
Creatinine, Ser: 1.97 mg/dL — ABNORMAL HIGH (ref 0.44–1.00)
GFR, Estimated: 27 mL/min — ABNORMAL LOW (ref >60)
Glucose, Bld: 124 mg/dL — ABNORMAL HIGH (ref 70–99)
Potassium: 4 mmol/L (ref 3.5–5.1)
Sodium: 133 mmol/L — ABNORMAL LOW (ref 135–145)

## 2021-07-22 LAB — HEMOGLOBIN: Hemoglobin: 9.5 g/dL — ABNORMAL LOW (ref 12.0–15.0)

## 2021-07-22 MED ORDER — IPRATROPIUM-ALBUTEROL 0.5-2.5 (3) MG/3ML IN SOLN
3.0000 mL | Freq: Two times a day (BID) | RESPIRATORY_TRACT | Status: DC
Start: 2021-07-22 — End: 2021-07-27
  Administered 2021-07-22 – 2021-07-27 (×9): 3 mL via RESPIRATORY_TRACT
  Filled 2021-07-22 (×10): qty 3

## 2021-07-22 MED ORDER — ENOXAPARIN SODIUM 80 MG/0.8ML IJ SOSY
0.5000 mg/kg | PREFILLED_SYRINGE | INTRAMUSCULAR | Status: DC
  Administered 2021-07-22 – 2021-07-23 (×2): 77.5 mg via SUBCUTANEOUS
  Filled 2021-07-22: qty 0.8

## 2021-07-22 MED ORDER — MELATONIN 5 MG PO TABS
5.0000 mg | ORAL_TABLET | Freq: Every day | ORAL | Status: DC
  Administered 2021-07-22 – 2021-07-25 (×3): 5 mg via ORAL
  Filled 2021-07-22 (×4): qty 1

## 2021-07-22 NOTE — Progress Notes (Signed)
PHARMACIST - PHYSICIAN COMMUNICATION ? ?CONCERNING:  Enoxaparin (Lovenox) for DVT Prophylaxis  ? ? ?RECOMMENDATION: ?Patient was prescribed enoxaprin '40mg'$  q24 hours for VTE prophylaxis.  ? ?Filed Weights  ? 07/21/21 0500 07/21/21 2030 07/22/21 0307  ?Weight: (!) 155.3 kg (342 lb 4.8 oz) (!) 153.4 kg (338 lb 3.2 oz) (!) 153.4 kg (338 lb 3.2 oz)  ? ? ?Body mass index is 58.05 kg/m?. ? ?Estimated Creatinine Clearance: 40.1 mL/min (A) (by C-G formula based on SCr of 1.97 mg/dL (H)). ? ? ?Based on [place] patient is candidate for enoxaparin 0.'5mg'$ /kg TBW SQ every 24 hours based on BMI being >30. ? ? ?DESCRIPTION: ?Pharmacy has adjusted enoxaparin dose per [facility] policy. ? ?Patient is now receiving enoxaparin 77.5 mg every 24 hours  ? ?Pernell Dupre, PharmD, BCPS ?Clinical Pharmacist ?07/22/2021 ?9:39 AM ? ?

## 2021-07-22 NOTE — Progress Notes (Signed)
[facility]  ROUNDING NOTE   Subjective:   Patient seen sitting up in chair, daughter at bedside Tolerating meals Denies shortness of breath at rest and on exertion Lower extremity edema greatly improved over course of hospitalization  Creatinine 1.97   Objective:  Vital signs in last 24 hours:  Temp:  [97.9 F (36.6 C)-98.3 F (36.8 C)] 98 F (36.7 C) (03/05 1156) Pulse Rate:  [73-89] 85 (03/05 1156) Resp:  [17-18] 17 (03/05 1156) BP: (115-150)/(45-85) 150/57 (03/05 1156) SpO2:  [92 %-100 %] 92 % (03/05 1156) Weight:  [153.4 kg-155 kg] 155 kg (03/05 0805)  Weight change: -3.538 kg Filed Weights   07/21/21 2030 07/22/21 0307 07/22/21 0805  Weight: (!) 153.4 kg (!) 153.4 kg (!) 155 kg    Intake/Output: I/O last 3 completed shifts: In: 240 [P.O.:240] Out: 2400 [Urine:2400]   Intake/Output this shift:  Total I/O In: -  Out: 1350 [Urine:1350]  Physical Exam: General: NAD  Head: Normocephalic, atraumatic. Moist oral mucosal membranes, Cpap in situ  Eyes: Anicteric  Lungs:  Clear bilaterally, normal effort  Heart: Regular rate and rhythm  Abdomen:  Soft, nontender  Extremities:  1+ peripheral edema.  Neurologic: Nonfocal, moving all four extremities  Skin: No lesions       Basic Metabolic Panel: Recent Labs  Lab 07/16/21 0446 07/17/21 0645 07/18/21 0650 07/19/21 0629 07/21/21 0542 07/22/21 0451  NA 137 136 134* 136 137 133*  K 4.5 4.3 4.7 4.2 4.0 4.0  CL 90* 91* 90* 89* 85* 77*  CO2 32 31 34* 35* 41* 41*  GLUCOSE 143* 107* 113* 109* 118* 124*  BUN 30* 41* 45* 49* 7* 60*  CREATININE 1.67* 2.03* 1.83* 1.86* 2.00* 1.97*  CALCIUM 9.8 9.3 8.9 9.1 9.1 9.4  MG 2.0  --   --   --   --   --      Liver Function Tests: Recent Labs  Lab 07/18/21 0650  ALBUMIN 3.4*    No results for input(s): LIPASE, AMYLASE in the last 168 hours. No results for input(s): AMMONIA in the last 168 hours.  CBC: Recent Labs  Lab 07/17/21 0645  07/19/21 0629 07/21/21 0542 07/22/21 0451  WBC 12.5* 14.5* 12.7*  --   HGB 8.8* 9.1* 9.1* 9.5*  HCT 29.2* 31.1* 30.2*  --   MCV 76.0* 76.8* 76.5*  --   PLT 414* 397 316  --      Cardiac Enzymes: No results for input(s): CKTOTAL, CKMB, CKMBINDEX, TROPONINI in the last 168 hours.  BNP: Invalid input(s): POCBNP  CBG: No results for input(s): GLUCAP in the last 168 hours.  Microbiology: Results for orders placed or performed during the hospital encounter of 07/15/21  Resp Panel by RT-PCR (Flu A&B, Covid) Nasopharyngeal Swab     Status: None   Collection Time: 07/15/21  4:36 AM   Specimen: Nasopharyngeal Swab; Nasopharyngeal(NP) swabs in vial transport medium  Result Value Ref Range Status   SARS Coronavirus 2 by RT PCR NEGATIVE NEGATIVE Final    Comment: (NOTE) SARS-CoV-2 target nucleic acids are NOT DETECTED.  The SARS-CoV-2 RNA is generally detectable in upper respiratory specimens during the acute phase of infection. The lowest concentration of SARS-CoV-2 viral copies this assay can detect is 138 copies/mL. A negative result does not preclude SARS-Cov-2 infection and should not be used as the sole basis for treatment or other patient management decisions. A negative result may occur with  improper specimen collection/handling, submission of specimen other than nasopharyngeal swab,  presence of viral mutation(s) within the areas targeted by this assay, and inadequate number of viral copies(<138 copies/mL). A negative result must be combined with clinical observations, patient history, and epidemiological information. The expected result is Negative.  Fact Sheet for Patients:  EntrepreneurPulse.com.au  Fact Sheet for Healthcare Providers:  IncredibleEmployment.be  This test is no t yet approved or cleared by the [place] FDA and  has been authorized for detection and/or diagnosis of SARS-CoV-2 by FDA under an Emergency Use  Authorization (EUA). This EUA will remain  in effect (meaning this test can be used) for the duration of the COVID-19 declaration under Section 564(b)(1) of the Act, 21 U.S.C.section 360bbb-3(b)(1), unless the authorization is terminated  or revoked sooner.       Influenza A by PCR NEGATIVE NEGATIVE Final   Influenza B by PCR NEGATIVE NEGATIVE Final    Comment: (NOTE) The Xpert Xpress SARS-CoV-2/FLU/RSV plus assay is intended as an aid in the diagnosis of influenza from Nasopharyngeal swab specimens and should not be used as a sole basis for treatment. Nasal washings and aspirates are unacceptable for Xpert Xpress SARS-CoV-2/FLU/RSV testing.  Fact Sheet for Patients: EntrepreneurPulse.com.au  Fact Sheet for Healthcare Providers: IncredibleEmployment.be  This test is not yet approved or cleared by the [place] FDA and has been authorized for detection and/or diagnosis of SARS-CoV-2 by FDA under an Emergency Use Authorization (EUA). This EUA will remain in effect (meaning this test can be used) for the duration of the COVID-19 declaration under Section 564(b)(1) of the Act, 21 U.S.C. section 360bbb-3(b)(1), unless the authorization is terminated or revoked.  Performed at [facility], [place], [place] [address]   Respiratory (~20 pathogens) panel by PCR     Status: None   Collection Time: 07/15/21  6:00 PM   Specimen: Nasopharyngeal Swab; Respiratory  Result Value Ref Range Status   Adenovirus NOT DETECTED NOT DETECTED Final   Coronavirus 229E NOT DETECTED NOT DETECTED Final    Comment: (NOTE) The Coronavirus on the Respiratory Panel, DOES NOT test for the novel  Coronavirus (2019 nCoV)    Coronavirus HKU1 NOT DETECTED NOT DETECTED Final   Coronavirus NL63 NOT DETECTED NOT DETECTED Final   Coronavirus OC43 NOT DETECTED NOT DETECTED Final   Metapneumovirus NOT DETECTED NOT DETECTED Final   Rhinovirus /  Enterovirus NOT DETECTED NOT DETECTED Final   Influenza A NOT DETECTED NOT DETECTED Final   Influenza B NOT DETECTED NOT DETECTED Final   Parainfluenza Virus 1 NOT DETECTED NOT DETECTED Final   Parainfluenza Virus 2 NOT DETECTED NOT DETECTED Final   Parainfluenza Virus 3 NOT DETECTED NOT DETECTED Final   Parainfluenza Virus 4 NOT DETECTED NOT DETECTED Final   Respiratory Syncytial Virus NOT DETECTED NOT DETECTED Final   Bordetella pertussis NOT DETECTED NOT DETECTED Final   Bordetella Parapertussis NOT DETECTED NOT DETECTED Final   Chlamydophila pneumoniae NOT DETECTED NOT DETECTED Final   Mycoplasma pneumoniae NOT DETECTED NOT DETECTED Final    Comment: Performed at [facility], [place]. [address] [place], [place] [address]  Expectorated Sputum Assessment w Gram Stain, Rflx to Resp Cult     Status: None   Collection Time: 07/19/21 10:45 PM   Specimen: Sputum  Result Value Ref Range Status   Specimen Description SPUTUM  Final   Special Requests NONE  Final   Sputum evaluation   Final    Sputum specimen not acceptable for testing.  Please recollect.   RESULT CALLED TO,  READ BACK BY AND VERIFIED WITH: ROSE NGU '@[id number]'$  ON 07/19/21 SKL Performed at [facility], [address] [place], [place] [address]    Report Status 07/19/2021 FINAL  Final    Coagulation Studies: No results for input(s): LABPROT, INR in the last 72 hours.  Urinalysis: No results for input(s): COLORURINE, LABSPEC, PHURINE, GLUCOSEU, HGBUR, BILIRUBINUR, KETONESUR, PROTEINUR, UROBILINOGEN, NITRITE, LEUKOCYTESUR in the last 72 hours.  Invalid input(s): APPERANCEUR     Imaging: No results found.   Medications:    promethazine (PHENERGAN) injection (IM or IVPB)       aspirin EC  81 mg Oral Daily   chlorhexidine  15 mL Mouth Rinse BID   cholestyramine  4 g Oral QHS   diltiazem  180 mg Oral Daily   enoxaparin (LOVENOX) injection  0.5 mg/kg Subcutaneous Q24H   furosemide  60 mg Intravenous BID    ipratropium-albuterol  3 mL Nebulization Q6H   loratadine  10 mg Oral Daily   mouth rinse  15 mL Mouth Rinse q12n4p   melatonin  5 mg Oral QHS   nystatin   Topical TID   pantoprazole  40 mg Oral Daily   acetaminophen **OR** acetaminophen, albuterol, dextromethorphan-guaiFENesin, hydrALAZINE, loperamide, ondansetron (ZOFRAN) IV, rOPINIRole  Assessment/ Plan:  Ms. NATALLIA STELLMACH is a 71 y.o.  female with COPD/asthma, congestive heart failure, obstructive sleep apnea, IBS, hypertension, who was admitted to [facility] on 07/15/2021 for COPD exacerbation ([place]) [J44.1] Acute on chronic diastolic congestive heart failure (HCC) [I50.33] Hypertensive emergency [I16.1] Acute on chronic respiratory failure with hypoxia (HCC) [J96.21] Acute respiratory failure with hypoxia and hypercapnia (HCC) [J96.01, J96.02]   Acute kidney injury on chronic kidney disease stage IIIB: baseline creatinine of 1.36, GFR of 42 on 03/10/2021. History of bland urine. Chronic kidney disease secondary to hypertension. Acute kidney injury secondary to acute cardiorenal syndrome. No IV contrast exposure. Renal ultrasound with no obstruction.  -Renal function remains stable during diuretic therapy.  Patient given 2 doses of metolazone last week with adequate urine output recorded.  Urine output recorded at 1.35 thus far this shift.  Continue IV furosemide and will determine home diuretic therapy needed for discharge.  Lab Results  Component Value Date   CREATININE 1.97 (H) 07/22/2021   CREATININE 2.00 (H) 07/21/2021   CREATININE 1.86 (H) 07/19/2021    Intake/Output Summary (Last 24 hours) at 07/22/2021 1254 Last data filed at 07/22/2021 1156 Gross per 24 hour  Intake 240 ml  Output 1350 ml  Net -1110 ml    2.  Acute on chronic diastolic congestive heart failure: echocardiogram riewed. Patient has failed outpatient therapy with PO furosemide.  -  Patient now negative by approximately 13 L since admission -Continue IV  Lasix  Hypertension:Home regimen includes olmesartan, furosemide PRN, and diltiazem.  - IV furosemide as above.  - Continue diltiazem - holding olmesartan as above.    Acute exacerbation of COPD: Continue supportive care and supplemental oxygen.  5. Anemia of chronic disease And iron deficiency anemia  patient did receive IV iron during the hospital admission Patient hemoglobin continues to improve    LOS: 7   3/5/202312:54 PM

## 2021-07-22 NOTE — Assessment & Plan Note (Signed)
Trial of melatonin.  

## 2021-07-22 NOTE — TOC Progression Note (Signed)
Transition of Care (TOC) - Progression Note  ? ? ?Patient Details  ?Name: Samantha Mason ?MRN: [id number] ?Date of Birth: 1951/03/25 ? ?Transition of Care (TOC) CM/SW Contact  ?Harriet Masson, RN ?Phone Number:[phone] ?07/22/2021, 10:51 AM ? ?Clinical Narrative:    ?Spoke with Thedore Mins ([employer]) concerning required paperwork and notified provider for signature on [facility] [facility]. Form for non-invasive respirator will be placed on front of chart and provider aware to sign for initiation of DME. ? ?TOC remains available to assist when needed. ? ? ?  ?  ? ?Expected Discharge Plan and Services ?  ?  ?  ?  ?  ?                ?  ?  ?  ?  ?  ?  ?  ?  ?  ?  ? ? ?Social Determinants of Health (SDOH) Interventions ?  ? ?Readmission Risk Interventions ?No flowsheet data found. ? ?

## 2021-07-22 NOTE — Progress Notes (Signed)
Samantha Mason presents with acute on chronic hypoxic and hypercapnic respiratory failure secondary to COPD.  The use of the NIV will treat patient's high PC02 levels (61 on 07/15/21 with elevated bicarb of 38.7 while on BiLevel) and can reduce risk of exacerbations and future hospitalizations when used at night and during the day .  All alternate devices [phone] and [id number]) have been proven ineffective to provide essential volume control necessary to maintain acceptable CO2 levels.  An NIV with IVAPS is necessary to prevent patient from life-threatening harm.  Interruption or failure to provide NIV would quickly lead to exacerbation of the patient's condition, hospital admission, and likely harm to the patient. Continued use is preferred.  Patient is able to protect their airways and clear secretions on their own. ? ?

## 2021-07-22 NOTE — Progress Notes (Signed)
?Progress Note ? ? ?Patient: Samantha Mason ERX:[id number] DOB: 1951/03/26 DOA: 07/15/2021     7 ?DOS: the patient was seen and examined on 07/22/2021 ?  ? ?Assessment and Plan: ?* Acute on chronic respiratory failure with hypoxia and hypercapnia (HCC) ?Continue BiPAP nightly.  Transitional care working on setting up NIV at night.  Patient on baseline 2 L of oxygen.  Initially required BiPAP for respiratory distress. ? ?Acute on chronic diastolic CHF (congestive heart failure) ([place]) ?Patient on Lasix 60 mg IV twice a day.  Patient's baseline weight around 322.  Patient had 2 weights today of 337 pounds on 340.98 pounds.  Continue to monitor weights. ? ? ?COPD exacerbation ([place]) ?Likely CHF and not COPD. ? ?Acute kidney injury superimposed on chronic kidney disease ([place]) ?Creatinine 2.03 on 07/17/2021 and creatinine 1.97 today underlying chronic kidney disease stage IV ? ? ?Essential hypertension ?Continue Cardizem CD and Lasix.  Initial blood pressure likely elevated secondary to not being able to breathe well. ? ?Microcytic anemia ?Last hemoglobin 9.5.  Received IV iron during the hospital course ? ?History of stroke ?LDL 68, Continue low-dose aspirin ? ? ?Insomnia ?Trial of melatonin ? ?Morbid obesity with BMI of 60.0-69.9, adult ([place]) ?BMI 61 ? ?OSA on CPAP ?Patient is at home CPAP not working.  TOC setting up noninvasive ventilation at night secondary to PCO2 67 and 61 on ABGs on 07/15/2021. ? ?Asthma with COPD (HCC)-resolved as of 07/22/2021 ? ? ? ? ? ? ? ? ? ?Subjective: The patient again did not sleep very well last night.  Declined any sleeping pills but is okay with doing melatonin.  She is starting to feel little bit better little less tight of the legs and able to breathe better.  Abdomen still distended.  Admitted with CHF and weight gain ? ?Physical Exam: ?Vitals:  ? 07/22/21 0800 07/22/21 0805 07/22/21 1156 07/22/21 1334  ?BP: (!) 131/45 115/80 (!) 150/57   ?Pulse: 86 73 85   ?Resp: '18 18 17   '$ ?Temp: 98.2  ?F (36.8 ?C) 97.9 ?F (36.6 ?C) 98 ?F (36.7 ?C)   ?TempSrc:      ?SpO2: 99% 100% 92% 97%  ?Weight:  (!) 155 kg    ?Height:      ? ?Physical Exam ?HENT:  ?   Head: Normocephalic.  ?   Mouth/Throat:  ?   Pharynx: No oropharyngeal exudate.  ?Eyes:  ?   General: Lids are normal.  ?   Conjunctiva/sclera: Conjunctivae normal.  ?Cardiovascular:  ?   Rate and Rhythm: Normal rate and regular rhythm.  ?   Heart sounds: Normal heart sounds, S1 normal and S2 normal.  ?Pulmonary:  ?   Breath sounds: Examination of the right-lower field reveals decreased breath sounds. Examination of the left-lower field reveals decreased breath sounds. Decreased breath sounds present. No wheezing, rhonchi or rales.  ?Abdominal:  ?   Palpations: Abdomen is soft.  ?   Tenderness: There is no abdominal tenderness.  ?Musculoskeletal:  ?   Right lower leg: Swelling present.  ?   Left lower leg: Swelling present.  ?Skin: ?   General: Skin is warm.  ?   Findings: No rash.  ?Neurological:  ?   Mental Status: She is alert and oriented to person, place, and time.  ?  ? ?Data Reviewed: ?Today's laboratory data reviewed.  Creatinine 1.97 chloride 77 CO2 41 NA 133 and hemoglobin 9.5 ? ?Family Communication: Patient's daughter at the bedside ? ?Disposition: ?Status is: Inpatient ?Remains  inpatient appropriate because: Still very high on her weight.  Baseline weight 322.  Patient had weights today 1 was 337 pounds immediately was 340.98 pounds. ? ?Planned Discharge Destination: Home ? ?Author: ?Loletha Grayer, MD ?07/22/2021 2:47 PM ? ?For on call review [email or web address].  ? ?

## 2021-07-22 NOTE — Progress Notes (Signed)
Patient has requested Ropinirole. Administered per orders ?

## 2021-07-23 DIAGNOSIS — I5033 Acute on chronic diastolic (congestive) heart failure: Secondary | ICD-10-CM | POA: Diagnosis not present

## 2021-07-23 DIAGNOSIS — N179 Acute kidney failure, unspecified: Secondary | ICD-10-CM | POA: Diagnosis not present

## 2021-07-23 DIAGNOSIS — J441 Chronic obstructive pulmonary disease with (acute) exacerbation: Secondary | ICD-10-CM | POA: Diagnosis not present

## 2021-07-23 DIAGNOSIS — J9621 Acute and chronic respiratory failure with hypoxia: Secondary | ICD-10-CM | POA: Diagnosis not present

## 2021-07-23 LAB — BASIC METABOLIC PANEL
Anion gap: 13 (ref 5–15)
BUN: 67 mg/dL — ABNORMAL HIGH (ref 8–23)
CO2: 41 mmol/L — ABNORMAL HIGH (ref 22–32)
Calcium: 9.3 mg/dL (ref 8.9–10.3)
Chloride: 78 mmol/L — ABNORMAL LOW (ref 98–111)
Creatinine, Ser: 2.21 mg/dL — ABNORMAL HIGH (ref 0.44–1.00)
GFR, Estimated: 24 mL/min — ABNORMAL LOW (ref >60)
Glucose, Bld: 119 mg/dL — ABNORMAL HIGH (ref 70–99)
Potassium: 4.4 mmol/L (ref 3.5–5.1)
Sodium: 132 mmol/L — ABNORMAL LOW (ref 135–145)

## 2021-07-23 MED ORDER — SODIUM CHLORIDE 0.9 % IV SOLN
12.5000 mg | Freq: Once | INTRAVENOUS | Status: AC
  Administered 2021-07-23: 12.5 mg via INTRAVENOUS
  Filled 2021-07-23: qty 12.5

## 2021-07-23 MED ORDER — ROPINIROLE HCL 1 MG PO TABS
1.0000 mg | ORAL_TABLET | Freq: Three times a day (TID) | ORAL | Status: DC
  Administered 2021-07-23 – 2021-07-27 (×14): 1 mg via ORAL
  Filled 2021-07-23 (×14): qty 1

## 2021-07-23 NOTE — Progress Notes (Signed)
Pt's sodium level down to 132. Pt states her bilateral lower edema is "slightly better" than on admission. Asking why it is taking so long for fluid to come off her body. States she is compliant on her fluid restriction. Currently 2+ pitting edema bil lower extrem. ?

## 2021-07-23 NOTE — Progress Notes (Signed)
Pt off Bipap. Placed back on O2@ 2 liters requesting breathing treatment. Respiratory notified.  ?

## 2021-07-23 NOTE — Progress Notes (Signed)
[facility]  ROUNDING NOTE   Subjective:   Patient remains seated in chair, daughter at bedside States overall she feels much better than on admission Maintains good appetite without nausea and vomiting Remains on 2 L nasal cannula Able to ambulate short distances  Creatinine 2.21 Urine output 3.3 L recorded in 24 hours  Objective:  Vital signs in last 24 hours:  Temp:  [97.6 F (36.4 C)-98.8 F (37.1 C)] 97.6 F (36.4 C) (03/06 1112) Pulse Rate:  [80-91] 80 (03/06 1112) Resp:  [16-18] 17 (03/06 0310) BP: (133-166)/(45-68) 143/54 (03/06 1112) SpO2:  [93 %-98 %] 98 % (03/06 1349) Weight:  [152.2 kg] 152.2 kg (03/06 0310)  Weight change: 1.588 kg Filed Weights   07/22/21 0307 07/22/21 0805 07/23/21 0310  Weight: (!) 153.4 kg (!) 155 kg (!) 152.2 kg    Intake/Output: I/O last 3 completed shifts: In: 0  Out: 3300 [Urine:3300]   Intake/Output this shift:  Total I/O In: 120 [P.O.:120] Out: -   Physical Exam: General: NAD  Head: Normocephalic, atraumatic. Moist oral mucosal membranes, Cpap in situ  Eyes: Anicteric  Lungs:  Clear bilaterally, normal effort  Heart: Regular rate and rhythm  Abdomen:  Soft, nontender  Extremities:  1+ peripheral edema.  Neurologic: Nonfocal, moving all four extremities  Skin: No lesions       Basic Metabolic Panel: Recent Labs  Lab 07/18/21 0650 07/19/21 0629 07/21/21 0542 07/22/21 0451 07/23/21 0515  NA 134* 136 137 133* 132*  K 4.7 4.2 4.0 4.0 4.4  CL 90* 89* 85* 77* 78*  CO2 34* 35* 41* 41* 41*  GLUCOSE 113* 109* 118* 124* 119*  BUN 45* 49* 7* 60* 67*  CREATININE 1.83* 1.86* 2.00* 1.97* 2.21*  CALCIUM 8.9 9.1 9.1 9.4 9.3     Liver Function Tests: Recent Labs  Lab 07/18/21 0650  ALBUMIN 3.4*    No results for input(s): LIPASE, AMYLASE in the last 168 hours. No results for input(s): AMMONIA in the last 168 hours.  CBC: Recent Labs  Lab 07/17/21 0645 07/19/21 0629 07/21/21 0542 07/22/21 0451   WBC 12.5* 14.5* 12.7*  --   HGB 8.8* 9.1* 9.1* 9.5*  HCT 29.2* 31.1* 30.2*  --   MCV 76.0* 76.8* 76.5*  --   PLT 414* 397 316  --      Cardiac Enzymes: No results for input(s): CKTOTAL, CKMB, CKMBINDEX, TROPONINI in the last 168 hours.  BNP: Invalid input(s): POCBNP  CBG: No results for input(s): GLUCAP in the last 168 hours.  Microbiology: Results for orders placed or performed during the hospital encounter of 07/15/21  Resp Panel by RT-PCR (Flu A&B, Covid) Nasopharyngeal Swab     Status: None   Collection Time: 07/15/21  4:36 AM   Specimen: Nasopharyngeal Swab; Nasopharyngeal(NP) swabs in vial transport medium  Result Value Ref Range Status   SARS Coronavirus 2 by RT PCR NEGATIVE NEGATIVE Final    Comment: (NOTE) SARS-CoV-2 target nucleic acids are NOT DETECTED.  The SARS-CoV-2 RNA is generally detectable in upper respiratory specimens during the acute phase of infection. The lowest concentration of SARS-CoV-2 viral copies this assay can detect is 138 copies/mL. A negative result does not preclude SARS-Cov-2 infection and should not be used as the sole basis for treatment or other patient management decisions. A negative result may occur with  improper specimen collection/handling, submission of specimen other than nasopharyngeal swab, presence of viral mutation(s) within the areas targeted by this assay, and inadequate number of viral  copies(<138 copies/mL). A negative result must be combined with clinical observations, patient history, and epidemiological information. The expected result is Negative.  Fact Sheet for Patients:  EntrepreneurPulse.com.au  Fact Sheet for Healthcare Providers:  IncredibleEmployment.be  This test is no t yet approved or cleared by the [place] FDA and  has been authorized for detection and/or diagnosis of SARS-CoV-2 by FDA under an Emergency Use Authorization (EUA). This EUA will remain  in  effect (meaning this test can be used) for the duration of the COVID-19 declaration under Section 564(b)(1) of the Act, 21 U.S.C.section 360bbb-3(b)(1), unless the authorization is terminated  or revoked sooner.       Influenza A by PCR NEGATIVE NEGATIVE Final   Influenza B by PCR NEGATIVE NEGATIVE Final    Comment: (NOTE) The Xpert Xpress SARS-CoV-2/FLU/RSV plus assay is intended as an aid in the diagnosis of influenza from Nasopharyngeal swab specimens and should not be used as a sole basis for treatment. Nasal washings and aspirates are unacceptable for Xpert Xpress SARS-CoV-2/FLU/RSV testing.  Fact Sheet for Patients: EntrepreneurPulse.com.au  Fact Sheet for Healthcare Providers: IncredibleEmployment.be  This test is not yet approved or cleared by the [place] FDA and has been authorized for detection and/or diagnosis of SARS-CoV-2 by FDA under an Emergency Use Authorization (EUA). This EUA will remain in effect (meaning this test can be used) for the duration of the COVID-19 declaration under Section 564(b)(1) of the Act, 21 U.S.C. section 360bbb-3(b)(1), unless the authorization is terminated or revoked.  Performed at [facility], [place], [place] [address]   Respiratory (~20 pathogens) panel by PCR     Status: None   Collection Time: 07/15/21  6:00 PM   Specimen: Nasopharyngeal Swab; Respiratory  Result Value Ref Range Status   Adenovirus NOT DETECTED NOT DETECTED Final   Coronavirus 229E NOT DETECTED NOT DETECTED Final    Comment: (NOTE) The Coronavirus on the Respiratory Panel, DOES NOT test for the novel  Coronavirus (2019 nCoV)    Coronavirus HKU1 NOT DETECTED NOT DETECTED Final   Coronavirus NL63 NOT DETECTED NOT DETECTED Final   Coronavirus OC43 NOT DETECTED NOT DETECTED Final   Metapneumovirus NOT DETECTED NOT DETECTED Final   Rhinovirus / Enterovirus NOT DETECTED NOT DETECTED Final    Influenza A NOT DETECTED NOT DETECTED Final   Influenza B NOT DETECTED NOT DETECTED Final   Parainfluenza Virus 1 NOT DETECTED NOT DETECTED Final   Parainfluenza Virus 2 NOT DETECTED NOT DETECTED Final   Parainfluenza Virus 3 NOT DETECTED NOT DETECTED Final   Parainfluenza Virus 4 NOT DETECTED NOT DETECTED Final   Respiratory Syncytial Virus NOT DETECTED NOT DETECTED Final   Bordetella pertussis NOT DETECTED NOT DETECTED Final   Bordetella Parapertussis NOT DETECTED NOT DETECTED Final   Chlamydophila pneumoniae NOT DETECTED NOT DETECTED Final   Mycoplasma pneumoniae NOT DETECTED NOT DETECTED Final    Comment: Performed at [facility], [place]. [address], [place], [place] [address]  Expectorated Sputum Assessment w Gram Stain, Rflx to Resp Cult     Status: None   Collection Time: 07/19/21 10:45 PM   Specimen: Sputum  Result Value Ref Range Status   Specimen Description SPUTUM  Final   Special Requests NONE  Final   Sputum evaluation   Final    Sputum specimen not acceptable for testing.  Please recollect.   RESULT CALLED TO, READ BACK BY AND VERIFIED WITH: ROSE NGU '@[id number]'$  ON 07/19/21 SKL Performed at [facility]  Lab, [address] [place], [place] [address]    Report Status 07/19/2021 FINAL  Final    Coagulation Studies: No results for input(s): LABPROT, INR in the last 72 hours.  Urinalysis: No results for input(s): COLORURINE, LABSPEC, PHURINE, GLUCOSEU, HGBUR, BILIRUBINUR, KETONESUR, PROTEINUR, UROBILINOGEN, NITRITE, LEUKOCYTESUR in the last 72 hours.  Invalid input(s): APPERANCEUR     Imaging: No results found.   Medications:    promethazine (PHENERGAN) injection (IM or IVPB) 12.5 mg (07/23/21 1517)     aspirin EC  81 mg Oral Daily   chlorhexidine  15 mL Mouth Rinse BID   cholestyramine  4 g Oral QHS   diltiazem  180 mg Oral Daily   enoxaparin (LOVENOX) injection  0.5 mg/kg Subcutaneous Q24H   furosemide  60 mg Intravenous BID   ipratropium-albuterol  3  mL Nebulization BID   loratadine  10 mg Oral Daily   mouth rinse  15 mL Mouth Rinse q12n4p   melatonin  5 mg Oral QHS   nystatin   Topical TID   pantoprazole  40 mg Oral Daily   rOPINIRole  1 mg Oral TID   acetaminophen **OR** acetaminophen, albuterol, dextromethorphan-guaiFENesin, hydrALAZINE, loperamide, ondansetron (ZOFRAN) IV  Assessment/ Plan:  Ms. Samantha Mason is a 71 y.o.  female with COPD/asthma, congestive heart failure, obstructive sleep apnea, IBS, hypertension, who was admitted to [facility] on 07/15/2021 for COPD exacerbation ([place]) [J44.1] Acute on chronic diastolic congestive heart failure ([place]) [I50.33] Hypertensive emergency [I16.1] Acute on chronic respiratory failure with hypoxia (HCC) [J96.21] Acute respiratory failure with hypoxia and hypercapnia (HCC) [J96.01, J96.02]   Acute kidney injury on chronic kidney disease stage IIIB: baseline creatinine of 1.36, GFR of 42 on 03/10/2021. History of bland urine. Chronic kidney disease secondary to hypertension. Acute kidney injury secondary to acute cardiorenal syndrome. No IV contrast exposure. Renal ultrasound with no obstruction.  -Renal function remained stable with current diuretic therapy.  Adequate urine output recorded in previous 24 hours.  We will continue treating with furosemide IV 60 mg twice daily.  Patient will require follow-up with nephrology to discharge  Lab Results  Component Value Date   CREATININE 2.21 (H) 07/23/2021   CREATININE 1.97 (H) 07/22/2021   CREATININE 2.00 (H) 07/21/2021    Intake/Output Summary (Last 24 hours) at 07/23/2021 1530 Last data filed at 07/23/2021 1300 Gross per 24 hour  Intake 120 ml  Output 1400 ml  Net -1280 ml    2.  Acute on chronic diastolic congestive heart failure: echocardiogram riewed. Patient has failed outpatient therapy with PO furosemide.  -Continue IV Lasix and daily weights  Hypertension:Home regimen includes olmesartan, furosemide PRN, and diltiazem.  - IV  furosemide as above.  - Continue diltiazem - holding olmesartan as above.    Acute exacerbation of COPD: Continue supportive care and supplemental oxygen.  5. Anemia of chronic disease And iron deficiency anemia  patient did receive IV iron during the hospital admission Current hemoglobin 9.5    LOS: 8   3/6/20233:30 PM

## 2021-07-23 NOTE — Progress Notes (Signed)
?Progress Note ? ? ?Patient: Samantha Mason [id number]:[id number] DOB: 1951-05-01 DOA: 07/15/2021     8 ?DOS: the patient was seen and examined on 07/23/2021 ?  ? ?Assessment and Plan: ?* Acute on chronic respiratory failure with hypoxia and hypercapnia (HCC) ?Continue BiPAP nightly.  Transitional care working on setting up NIV for home.  Patient on baseline 2 L of oxygen.  Initially required BiPAP for respiratory distress. ? ?Acute on chronic diastolic CHF (congestive heart failure) ([place]) ?Patient on Lasix 60 mg IV twice a day.  Patient's baseline weight around 322.  Weight this morning of 334. ? ? ?COPD exacerbation ([place]) ?Likely CHF and not COPD.  Continue nebulizers as needed ? ?Acute kidney injury superimposed on chronic kidney disease ([place]) ?Creatinine up at 2.21 today.  Continue to watch closely with diuresis. ? ? ?Essential hypertension ?Continue Cardizem CD and Lasix.   ? ?Microcytic anemia ?Last hemoglobin 9.5.  Received IV iron during the hospital course. ? ?History of stroke ?LDL 68, Continue low-dose aspirin ? ? ?Insomnia ?Trial of melatonin ? ?Morbid obesity with BMI of 60.0-69.9, adult ([place]) ?BMI 61 ? ?OSA on CPAP ?Patient is at home CPAP not working.  TOC set up noninvasive ventilation at night.  Currently on BiPAP while here. ? ?Asthma with COPD (HCC)-resolved as of 07/22/2021 ? ? ? ? ? ? ?  ? ?Subjective: Patient again did not sleep very well last night.  She wore the BiPAP for a little bit but needed to take it off.  Patient's not urinating as much with the Lasix at this point.  Weight still trending better at 334 today.  Breathing better today.  Leg still swollen. ? ?Physical Exam: ?Vitals:  ? 07/23/21 0725 07/23/21 1006 07/23/21 1112 07/23/21 1349  ?BP:  (!) 144/61 (!) 143/54   ?Pulse:  84 80   ?Resp:      ?Temp:   97.6 ?F (36.4 ?C)   ?TempSrc:      ?SpO2: 97% 96% 94% 98%  ?Weight:      ?Height:      ? ?Physical Exam ?HENT:  ?   Head: Normocephalic.  ?   Mouth/Throat:  ?   Pharynx: No oropharyngeal  exudate.  ?Eyes:  ?   General: Lids are normal.  ?   Conjunctiva/sclera: Conjunctivae normal.  ?Cardiovascular:  ?   Rate and Rhythm: Normal rate and regular rhythm.  ?   Heart sounds: Normal heart sounds, S1 normal and S2 normal.  ?Pulmonary:  ?   Breath sounds: Examination of the right-lower field reveals decreased breath sounds. Examination of the left-lower field reveals decreased breath sounds. Decreased breath sounds present. No wheezing, rhonchi or rales.  ?Abdominal:  ?   Palpations: Abdomen is soft.  ?   Tenderness: There is no abdominal tenderness.  ?Musculoskeletal:  ?   Right lower leg: Swelling present.  ?   Left lower leg: Swelling present.  ?Skin: ?   General: Skin is warm.  ?   Findings: No rash.  ?Neurological:  ?   Mental Status: She is alert and oriented to person, place, and time.  ?  ?Data Reviewed: ?Creatinine up to 2.21, CO2 up to 41 and chloride 78.  Sodium 132 ? ?Family Communication: daughter at bedside ? ?Disposition: ?Status is: Inpatient ?Remains inpatient appropriate because: Still quite a bit higher than her baseline weight.  Still needs IV diuresis ? Planned Discharge Destination: Home ? ?Author: ?Loletha Grayer, MD ?07/23/2021 2:44 PM ? ?For on call review [email or web address].  ?

## 2021-07-23 NOTE — TOC Progression Note (Signed)
Transition of Care (TOC) - Progression Note  ? ? ?Patient Details  ?Name: Samantha Mason ?MRN: [id number] ?Date of Birth: 03-Aug-1950 ? ?Transition of Care (TOC) CM/SW Contact  ?Alberteen Sam, LCSW ?Phone Number: ?07/23/2021, 11:41 AM ? ?Clinical Narrative:    ? ?CSW has Architectural technologist with adapt trilogy orders, TOC will continue to follow for needs.  ? ?  ?  ? ?Expected Discharge Plan and Services ?  ?  ?  ?  ?  ?                ?  ?  ?  ?  ?  ?  ?  ?  ?  ?  ? ? ?Social Determinants of Health (SDOH) Interventions ?  ? ?Readmission Risk Interventions ?No flowsheet data found. ? ?

## 2021-07-24 ENCOUNTER — Inpatient Hospital Stay: Payer: Medicare Other

## 2021-07-24 ENCOUNTER — Ambulatory Visit: Payer: Medicare Other | Admitting: Family

## 2021-07-24 DIAGNOSIS — I5033 Acute on chronic diastolic (congestive) heart failure: Secondary | ICD-10-CM | POA: Diagnosis not present

## 2021-07-24 DIAGNOSIS — I10 Essential (primary) hypertension: Secondary | ICD-10-CM | POA: Diagnosis not present

## 2021-07-24 DIAGNOSIS — J9621 Acute and chronic respiratory failure with hypoxia: Secondary | ICD-10-CM | POA: Diagnosis not present

## 2021-07-24 DIAGNOSIS — N179 Acute kidney failure, unspecified: Secondary | ICD-10-CM | POA: Diagnosis not present

## 2021-07-24 DIAGNOSIS — R110 Nausea: Secondary | ICD-10-CM | POA: Diagnosis not present

## 2021-07-24 LAB — RENAL FUNCTION PANEL
Albumin: 3.3 g/dL — ABNORMAL LOW (ref 3.5–5.0)
Anion gap: 16 — ABNORMAL HIGH (ref 5–15)
BUN: 64 mg/dL — ABNORMAL HIGH (ref 8–23)
CO2: 39 mmol/L — ABNORMAL HIGH (ref 22–32)
Calcium: 9.2 mg/dL (ref 8.9–10.3)
Chloride: 75 mmol/L — ABNORMAL LOW (ref 98–111)
Creatinine, Ser: 2.2 mg/dL — ABNORMAL HIGH (ref 0.44–1.00)
GFR, Estimated: 24 mL/min — ABNORMAL LOW (ref >60)
Glucose, Bld: 129 mg/dL — ABNORMAL HIGH (ref 70–99)
Phosphorus: 3.6 mg/dL (ref 2.5–4.6)
Potassium: 4.1 mmol/L (ref 3.5–5.1)
Sodium: 130 mmol/L — ABNORMAL LOW (ref 135–145)

## 2021-07-24 MED ORDER — ENOXAPARIN SODIUM 80 MG/0.8ML IJ SOSY
0.5000 mg/kg | PREFILLED_SYRINGE | INTRAMUSCULAR | Status: DC
  Administered 2021-07-24 – 2021-07-27 (×4): 75 mg via SUBCUTANEOUS
  Filled 2021-07-24 (×4): qty 0.8

## 2021-07-24 MED ORDER — METOCLOPRAMIDE HCL 5 MG/ML IJ SOLN
5.0000 mg | Freq: Three times a day (TID) | INTRAMUSCULAR | Status: DC
  Administered 2021-07-24 – 2021-07-27 (×10): 5 mg via INTRAVENOUS
  Filled 2021-07-24 (×10): qty 2

## 2021-07-24 MED ORDER — PANTOPRAZOLE SODIUM 40 MG PO TBEC
40.0000 mg | DELAYED_RELEASE_TABLET | Freq: Two times a day (BID) | ORAL | Status: DC
  Administered 2021-07-24 – 2021-07-27 (×6): 40 mg via ORAL
  Filled 2021-07-24 (×6): qty 1

## 2021-07-24 NOTE — Assessment & Plan Note (Addendum)
Improving.  Right upper quadrant ultrasound negative.  As needed Reglan during hospitalization ?

## 2021-07-24 NOTE — Progress Notes (Signed)
Prn albuterol given for sob ?

## 2021-07-24 NOTE — Progress Notes (Signed)
?Progress Note ? ? ?Patient: Samantha Mason QVZ:[id number] DOB: 1951-02-17 DOA: 07/15/2021     9 ?DOS: the patient was seen and examined on 07/24/2021 ?  ?Brief hospital course: ?Samantha Mason is a 71 y.o. F with dCHF and COPD on 2L home O2, OSA on CPAP, anemia, IBS, morbid obesity, and HTN and CKD IIIb baseline 1.2-1.6 who presented with leg swelling for 2 weeks, then dyspnea for a few days and dry cough.  The patient's baseline weight is 322 pounds.  Came in at 359 pounds. ? ?In the ER, she was in severe respiratory distress, required BiPAP.  CXR with edema, BP 231/106, pCO2 67.   ? ?Patient has been undergoing IV diuresis with Lasix and is now down to 329 pounds.  Nephrology would like to diurese for another day or so with IV diuretics. ? ?Patient having a lot of nausea.  Reglan ordered. ? ? ? ?Assessment and Plan: ?* Acute on chronic respiratory failure with hypoxia and hypercapnia (HCC) ?Transitional care working on setting up NIV for home.  Patient on baseline 2 L of oxygen.  Initially required BiPAP for respiratory distress.  Unable to tolerate the BiPAP and was switched over to CPAP because we do not have an IV here. ? ?Nausea ?Patient still having some nausea.  Right upper quadrant ultrasound negative.  Added Reglan today. ? ?Acute on chronic diastolic CHF (congestive heart failure) ([place]) ?Patient on Lasix 60 mg IV twice a day.  Patient's baseline weight around 322.  Weight this morning of 329.  Nephrology would like to diuresis a day or 2 to get the weight down further. ? ? ?COPD exacerbation ([place]) ?Likely CHF and not COPD.  Continue nebulizers as needed ? ?Acute kidney injury superimposed on chronic kidney disease ([place]) ?Creatinine up at 2.20 today.  Continue to watch closely with diuresis. ? ? ?Essential hypertension ?Continue Cardizem CD and Lasix.   ? ?Microcytic anemia ?Last hemoglobin 9.5.  Received IV iron during the hospital course. ? ?History of stroke ?LDL 68, Continue low-dose  aspirin ? ? ?Insomnia ?Trial of melatonin ? ?Morbid obesity with BMI of 60.0-69.9, adult ([place]) ?BMI 61 ? ?OSA on CPAP ?Patient is at home CPAP not working.  TOC set up noninvasive ventilation at night.  Currently not tolerating the BiPAP very well and went back to the CPAP last night. ? ? ? ? ? ? ? ?  ? ?Subjective: Patient the last few days has been having a lot of nausea.  Still urinating well.  Weights trending better admitted with acute on chronic CHF with quite a bit of weight gain ? ?Physical Exam: ?Vitals:  ? 07/24/21 0749 07/24/21 0812 07/24/21 1057 07/24/21 1505  ?BP:  (!) 128/44 (!) 116/52 (!) 146/54  ?Pulse:  86 82 86  ?Resp:      ?Temp:  98.1 ?F (36.7 ?C) 98 ?F (36.7 ?C) 98.4 ?F (36.9 ?C)  ?TempSrc:      ?SpO2: 91% 94% 93% 94%  ?Weight:      ?Height:      ? ?Physical Exam ?HENT:  ?   Head: Normocephalic.  ?   Mouth/Throat:  ?   Pharynx: No oropharyngeal exudate.  ?Eyes:  ?   General: Lids are normal.  ?   Conjunctiva/sclera: Conjunctivae normal.  ?Cardiovascular:  ?   Rate and Rhythm: Normal rate and regular rhythm.  ?   Heart sounds: Normal heart sounds, S1 normal and S2 normal.  ?Pulmonary:  ?   Breath sounds: Examination of  the right-lower field reveals decreased breath sounds. Examination of the left-lower field reveals decreased breath sounds. Decreased breath sounds present. No wheezing, rhonchi or rales.  ?Abdominal:  ?   Palpations: Abdomen is soft.  ?   Tenderness: There is no abdominal tenderness.  ?Musculoskeletal:  ?   Right lower leg: Swelling present.  ?   Left lower leg: Swelling present.  ?Skin: ?   General: Skin is warm.  ?   Findings: No rash.  ?Neurological:  ?   Mental Status: She is alert and oriented to person, place, and time.  ?  ?Data Reviewed: ?Laboratory data reviewed from today.  Creatinine 2.20 ? ?Family Communication: Spoke with daughter at the bedside ? ?Disposition: ?Status is: Inpatient ?Remains inpatient appropriate because: Still requiring IV diuresis ? ?Planned  Discharge Destination: Home ? ?Author: ?Loletha Grayer, MD ?07/24/2021 4:23 PM ? ?For on call review [email or web address].  ?

## 2021-07-24 NOTE — Progress Notes (Signed)
[facility]  ROUNDING NOTE   Subjective:   Patient seen resting in bed Daughter at bedside States she continues to feel better daily however reports shortness of breath overnight requiring 2 breathing treatments Continues to maintain good urine output, greater than 1 L overnight  Objective:  Vital signs in last 24 hours:  Temp:  [98 F (36.7 C)-98.7 F (37.1 C)] 98.4 F (36.9 C) (03/07 1505) Pulse Rate:  [82-91] 86 (03/07 1505) Resp:  [17-18] 18 (03/07 0427) BP: (116-146)/(44-63) 146/54 (03/07 1505) SpO2:  [91 %-95 %] 94 % (03/07 1505) Weight:  [149.7 kg] 149.7 kg (03/07 0651)  Weight change: -5.307 kg Filed Weights   07/22/21 0805 07/23/21 0310 07/24/21 0651  Weight: (!) 155 kg (!) 152.2 kg (!) 149.7 kg    Intake/Output: I/O last 3 completed shifts: In: 120 [P.O.:120] Out: 1200 [Urine:1200]   Intake/Output this shift:  Total I/O In: -  Out: 200 [Urine:200]  Physical Exam: General: NAD  Head: Normocephalic, atraumatic. Moist oral mucosal membranes, Cpap in situ  Eyes: Anicteric  Lungs:  Clear bilaterally, normal effort  Heart: Regular rate and rhythm  Abdomen:  Soft, nontender  Extremities:  1+ peripheral edema.  Neurologic: Nonfocal, moving all four extremities  Skin: No lesions       Basic Metabolic Panel: Recent Labs  Lab 07/19/21 0629 07/21/21 0542 07/22/21 0451 07/23/21 0515 07/24/21 0025  NA 136 137 133* 132* 130*  K 4.2 4.0 4.0 4.4 4.1  CL 89* 85* 77* 78* 75*  CO2 35* 41* 41* 41* 39*  GLUCOSE 109* 118* 124* 119* 129*  BUN 49* 7* 60* 67* 64*  CREATININE 1.86* 2.00* 1.97* 2.21* 2.20*  CALCIUM 9.1 9.1 9.4 9.3 9.2  PHOS  --   --   --   --  3.6     Liver Function Tests: Recent Labs  Lab 07/18/21 0650 07/24/21 0025  ALBUMIN 3.4* 3.3*    No results for input(s): LIPASE, AMYLASE in the last 168 hours. No results for input(s): AMMONIA in the last 168 hours.  CBC: Recent Labs  Lab 07/19/21 0629 07/21/21 0542  07/22/21 0451  WBC 14.5* 12.7*  --   HGB 9.1* 9.1* 9.5*  HCT 31.1* 30.2*  --   MCV 76.8* 76.5*  --   PLT 397 316  --      Cardiac Enzymes: No results for input(s): CKTOTAL, CKMB, CKMBINDEX, TROPONINI in the last 168 hours.  BNP: Invalid input(s): POCBNP  CBG: No results for input(s): GLUCAP in the last 168 hours.  Microbiology: Results for orders placed or performed during the hospital encounter of 07/15/21  Resp Panel by RT-PCR (Flu A&B, Covid) Nasopharyngeal Swab     Status: None   Collection Time: 07/15/21  4:36 AM   Specimen: Nasopharyngeal Swab; Nasopharyngeal(NP) swabs in vial transport medium  Result Value Ref Range Status   SARS Coronavirus 2 by RT PCR NEGATIVE NEGATIVE Final    Comment: (NOTE) SARS-CoV-2 target nucleic acids are NOT DETECTED.  The SARS-CoV-2 RNA is generally detectable in upper respiratory specimens during the acute phase of infection. The lowest concentration of SARS-CoV-2 viral copies this assay can detect is 138 copies/mL. A negative result does not preclude SARS-Cov-2 infection and should not be used as the sole basis for treatment or other patient management decisions. A negative result may occur with  improper specimen collection/handling, submission of specimen other than nasopharyngeal swab, presence of viral mutation(s) within the areas targeted by this assay, and inadequate number of  viral copies(<138 copies/mL). A negative result must be combined with clinical observations, patient history, and epidemiological information. The expected result is Negative.  Fact Sheet for Patients:  EntrepreneurPulse.com.au  Fact Sheet for Healthcare Providers:  IncredibleEmployment.be  This test is no t yet approved or cleared by the [place] FDA and  has been authorized for detection and/or diagnosis of SARS-CoV-2 by FDA under an Emergency Use Authorization (EUA). This EUA will remain  in effect (meaning  this test can be used) for the duration of the COVID-19 declaration under Section 564(b)(1) of the Act, 21 U.S.C.section 360bbb-3(b)(1), unless the authorization is terminated  or revoked sooner.       Influenza A by PCR NEGATIVE NEGATIVE Final   Influenza B by PCR NEGATIVE NEGATIVE Final    Comment: (NOTE) The Xpert Xpress SARS-CoV-2/FLU/RSV plus assay is intended as an aid in the diagnosis of influenza from Nasopharyngeal swab specimens and should not be used as a sole basis for treatment. Nasal washings and aspirates are unacceptable for Xpert Xpress SARS-CoV-2/FLU/RSV testing.  Fact Sheet for Patients: EntrepreneurPulse.com.au  Fact Sheet for Healthcare Providers: IncredibleEmployment.be  This test is not yet approved or cleared by the [place] FDA and has been authorized for detection and/or diagnosis of SARS-CoV-2 by FDA under an Emergency Use Authorization (EUA). This EUA will remain in effect (meaning this test can be used) for the duration of the COVID-19 declaration under Section 564(b)(1) of the Act, 21 U.S.C. section 360bbb-3(b)(1), unless the authorization is terminated or revoked.  Performed at [facility], [place], [place] [address]   Respiratory (~20 pathogens) panel by PCR     Status: None   Collection Time: 07/15/21  6:00 PM   Specimen: Nasopharyngeal Swab; Respiratory  Result Value Ref Range Status   Adenovirus NOT DETECTED NOT DETECTED Final   Coronavirus 229E NOT DETECTED NOT DETECTED Final    Comment: (NOTE) The Coronavirus on the Respiratory Panel, DOES NOT test for the novel  Coronavirus (2019 nCoV)    Coronavirus HKU1 NOT DETECTED NOT DETECTED Final   Coronavirus NL63 NOT DETECTED NOT DETECTED Final   Coronavirus OC43 NOT DETECTED NOT DETECTED Final   Metapneumovirus NOT DETECTED NOT DETECTED Final   Rhinovirus / Enterovirus NOT DETECTED NOT DETECTED Final   Influenza A NOT  DETECTED NOT DETECTED Final   Influenza B NOT DETECTED NOT DETECTED Final   Parainfluenza Virus 1 NOT DETECTED NOT DETECTED Final   Parainfluenza Virus 2 NOT DETECTED NOT DETECTED Final   Parainfluenza Virus 3 NOT DETECTED NOT DETECTED Final   Parainfluenza Virus 4 NOT DETECTED NOT DETECTED Final   Respiratory Syncytial Virus NOT DETECTED NOT DETECTED Final   Bordetella pertussis NOT DETECTED NOT DETECTED Final   Bordetella Parapertussis NOT DETECTED NOT DETECTED Final   Chlamydophila pneumoniae NOT DETECTED NOT DETECTED Final   Mycoplasma pneumoniae NOT DETECTED NOT DETECTED Final    Comment: Performed at [facility], [place]. [address], [place], [place] [address]  Expectorated Sputum Assessment w Gram Stain, Rflx to Resp Cult     Status: None   Collection Time: 07/19/21 10:45 PM   Specimen: Sputum  Result Value Ref Range Status   Specimen Description SPUTUM  Final   Special Requests NONE  Final   Sputum evaluation   Final    Sputum specimen not acceptable for testing.  Please recollect.   RESULT CALLED TO, READ BACK BY AND VERIFIED WITH: ROSE NGU '@[id number]'$  ON 07/19/21 SKL Performed at [employer]  [facility], [address] [place], [place] [address]    Report Status 07/19/2021 FINAL  Final    Coagulation Studies: No results for input(s): LABPROT, INR in the last 72 hours.  Urinalysis: No results for input(s): COLORURINE, LABSPEC, PHURINE, GLUCOSEU, HGBUR, BILIRUBINUR, KETONESUR, PROTEINUR, UROBILINOGEN, NITRITE, LEUKOCYTESUR in the last 72 hours.  Invalid input(s): APPERANCEUR     Imaging: US Abdomen Limited RUQ (LIVER/GB)  Result Date: 07/24/2021 CLINICAL DATA:  Nausea EXAM: ULTRASOUND ABDOMEN LIMITED RIGHT UPPER QUADRANT COMPARISON:  Abdominal CT 08/30/2010 FINDINGS: Gallbladder: History of cholecystectomy. Common bile duct: Diameter: 6 mm. Very limited visualization of the ducts due to body habitus. Liver: Diffusely echogenic with diminished acoustic penetration. No evidence  of mass lesion. Portal vein is patent on color Doppler imaging with normal direction of blood flow towards the liver. IMPRESSION: 1. No acute finding.  Remote cholecystectomy. 2. Hepatic steatosis. Electronically Signed   By: Jorje Guild M.D.   On: 07/24/2021 10:32     Medications:       aspirin EC  81 mg Oral Daily   chlorhexidine  15 mL Mouth Rinse BID   cholestyramine  4 g Oral QHS   diltiazem  180 mg Oral Daily   enoxaparin (LOVENOX) injection  0.5 mg/kg Subcutaneous Q24H   furosemide  60 mg Intravenous BID   ipratropium-albuterol  3 mL Nebulization BID   loratadine  10 mg Oral Daily   mouth rinse  15 mL Mouth Rinse q12n4p   melatonin  5 mg Oral QHS   metoCLOPramide (REGLAN) injection  5 mg Intravenous TID AC   nystatin   Topical TID   pantoprazole  40 mg Oral Daily   rOPINIRole  1 mg Oral TID   acetaminophen **OR** acetaminophen, albuterol, dextromethorphan-guaiFENesin, hydrALAZINE, loperamide, ondansetron (ZOFRAN) IV  Assessment/ Plan:  Ms. Samantha Mason is a 71 y.o.  female with COPD/asthma, congestive heart failure, obstructive sleep apnea, IBS, hypertension, who was admitted to [facility] on 07/15/2021 for COPD exacerbation ([place]) [J44.1] Acute on chronic diastolic congestive heart failure (HCC) [I50.33] Hypertensive emergency [I16.1] Acute on chronic respiratory failure with hypoxia (HCC) [J96.21] Acute respiratory failure with hypoxia and hypercapnia (HCC) [J96.01, J96.02]   Acute kidney injury on chronic kidney disease stage IIIB: baseline creatinine of 1.36, GFR of 42 on 03/10/2021. History of bland urine. Chronic kidney disease secondary to hypertension. Acute kidney injury secondary to acute cardiorenal syndrome. No IV contrast exposure. Renal ultrasound with no obstruction.  -Creatinine remained stable with adequate urine output.  Continue IV Lasix at current dosing to optimize fluid removal.  We will consider transitioning to oral agent in 1 to 2 days.  Lab Results   Component Value Date   CREATININE 2.20 (H) 07/24/2021   CREATININE 2.21 (H) 07/23/2021   CREATININE 1.97 (H) 07/22/2021    Intake/Output Summary (Last 24 hours) at 07/24/2021 1532 Last data filed at 07/24/2021 0900 Gross per 24 hour  Intake --  Output 200 ml  Net -200 ml    2.  Acute on chronic diastolic congestive heart failure: echocardiogram riewed. Patient has failed outpatient therapy with PO furosemide.  -Continue IV Lasix and daily weights  Hypertension:Home regimen includes olmesartan, furosemide PRN, and diltiazem.  - IV furosemide as above.  - Continue diltiazem - holding olmesartan as above.    Acute exacerbation of COPD: Continue supportive care and supplemental oxygen.  5. Anemia of chronic disease And iron deficiency anemia  patient did receive IV iron during the hospital admission  LOS: 9   3/7/20233:32 PM

## 2021-07-25 DIAGNOSIS — I5033 Acute on chronic diastolic (congestive) heart failure: Secondary | ICD-10-CM | POA: Diagnosis not present

## 2021-07-25 DIAGNOSIS — N179 Acute kidney failure, unspecified: Secondary | ICD-10-CM | POA: Diagnosis not present

## 2021-07-25 DIAGNOSIS — J9621 Acute and chronic respiratory failure with hypoxia: Secondary | ICD-10-CM | POA: Diagnosis not present

## 2021-07-25 LAB — CBC
HCT: 32 % — ABNORMAL LOW (ref 36.0–46.0)
Hemoglobin: 9.6 g/dL — ABNORMAL LOW (ref 12.0–15.0)
MCH: 22.7 pg — ABNORMAL LOW (ref 26.0–34.0)
MCHC: 30 g/dL (ref 30.0–36.0)
MCV: 75.7 fL — ABNORMAL LOW (ref 80.0–100.0)
Platelets: 309 10*3/uL (ref 150–400)
RBC: 4.23 MIL/uL (ref 3.87–5.11)
RDW: 17.2 % — ABNORMAL HIGH (ref 11.5–15.5)
WBC: 9.9 10*3/uL (ref 4.0–10.5)
nRBC: 0 % (ref 0.0–0.2)

## 2021-07-25 LAB — BASIC METABOLIC PANEL
Anion gap: 11 (ref 5–15)
BUN: 66 mg/dL — ABNORMAL HIGH (ref 8–23)
CO2: 44 mmol/L — ABNORMAL HIGH (ref 22–32)
Calcium: 9.3 mg/dL (ref 8.9–10.3)
Chloride: 76 mmol/L — ABNORMAL LOW (ref 98–111)
Creatinine, Ser: 2.45 mg/dL — ABNORMAL HIGH (ref 0.44–1.00)
GFR, Estimated: 21 mL/min — ABNORMAL LOW (ref >60)
Glucose, Bld: 117 mg/dL — ABNORMAL HIGH (ref 70–99)
Potassium: 4.2 mmol/L (ref 3.5–5.1)
Sodium: 131 mmol/L — ABNORMAL LOW (ref 135–145)

## 2021-07-25 LAB — MAGNESIUM: Magnesium: 1.7 mg/dL (ref 1.7–2.4)

## 2021-07-25 LAB — PHOSPHORUS: Phosphorus: 4.3 mg/dL (ref 2.5–4.6)

## 2021-07-25 MED ORDER — TORSEMIDE 20 MG PO TABS
40.0000 mg | ORAL_TABLET | Freq: Every day | ORAL | Status: DC
  Administered 2021-07-26 – 2021-07-27 (×2): 40 mg via ORAL
  Filled 2021-07-25 (×2): qty 2

## 2021-07-25 NOTE — Progress Notes (Signed)
Patient willing to try bipap. Pt states has been a struggle due to breathing difficulty, anxiety, and lack of usage of home device. Encouraged patient to wean self onto device and increase time each night so that her anxiety is not as great when it come to this therapy. She has expressed her willingness and understanding as to why therapy is important but is concerned with issues of how to get started with using the device again. Will continue to monitor progress.  ?

## 2021-07-25 NOTE — Progress Notes (Signed)
Mobility Specialist - Progress Note ? ? ? 07/25/21 1400  ?Mobility  ?Range of Motion/Exercises Right arm;Left arm;Right leg;Left leg  ?Level of Assistance Independent  ?Assistive Device None  ?Distance Ambulated (ft) 0 ft  ?Activity Response Tolerated well  ?$Mobility charge 1 Mobility  ? ? ?Pre-mobility: 87 HR, BP, 92 %SpO2 ?During mobility:94  HR, BP, 90% SpO2 ? ?Pt sitting in chair upon arrival using 2L [place]. Pt completes BLE [place] indep. - voiced complaints of "light headness" after marching in place. Pt initiated 2 breaks but tolerated session well. Pt left in chair with alarm set, needs in reach and family at bedside. ? ?Merrily Brittle ?Mobility Specialist ?07/25/21, 3:02 PM ?  ? ? ? ?

## 2021-07-25 NOTE — Progress Notes (Signed)
Rep from [facility] has brought patient a home positive pressure unit in for bedtime use. Patient states that he thoroughly went over unit. She states it does a little more than a cpap but not as aggressive as a bipap. She states she is gonna use unit tonight in an effort to get use to wearing such at home. Unit appears to be in proper working order when patient powers it on. Mask and circuit in tact.  ?

## 2021-07-25 NOTE — Progress Notes (Signed)
[facility] Progress Note ? ?Patient: Samantha Mason    HAL:[id number]  DOA: 07/15/2021    ?Date of Service: the patient was seen and examined on 07/25/2021 ? ?Brief hospital course: ?[name] is a 71 y.o. F with diastolic CHF, and COPD with chronic respiratory failure on 2 L nasal cannula as well as obstructive sleep apnea on CPAP, stage IIIb chronic kidney disease, hypertension and morbid obesity who presented to the emergency room on 2/26 for leg swelling and shortness of breath and found to be acute diastolic heart failure.  The patient's baseline weight is 322 pounds and she came in at 359 pounds.   ? ?Patient has been undergoing IV diuresis with Lasix and is now down to 329 pounds.  ? ?Assessment and Plan: ?Assessment and Plan: ?* Acute on chronic respiratory failure with hypoxia and hypercapnia (HCC) ?Transitional care working on setting up NIV for home.  Patient on baseline 2 L of oxygen.  Initially required BiPAP for respiratory distress.  Unable to tolerate the BiPAP and was switched over to CPAP because we do not have an IV here. ? ?Nausea ?Improving.  Right upper quadrant ultrasound negative.  Added Reglan today. ? ?Acute on chronic diastolic CHF (congestive heart failure) ([place]) ?Patient on Lasix 60 mg IV twice a day.  Patient's baseline weight around 322.  Weight this morning of 329.  We will continue diuresis until closer to baseline of dry weight. ? ? ?COPD exacerbation ([place]) ?Likely CHF and not COPD.  Continue nebulizers as needed ? ?Acute kidney injury superimposed on chronic kidney disease ([place]) ?Creatinine up at 2.45 today.  Continue to watch closely with diuresis.  Nephrology following ? ? ?Essential hypertension ?Continue Cardizem CD and Lasix.   ? ?Microcytic anemia ?Hemoglobin remaining stable, today at 9.6.  Received IV iron during the hospital course. ? ?History of stroke ?LDL 68, Continue low-dose aspirin ? ? ?Morbid obesity ([place]) ?Meets criteria with BMI greater than  40 ? ?Insomnia ?Trial of melatonin ? ?OSA on CPAP ?Patient is at home CPAP not working.  TOC set up noninvasive ventilation at night.  Currently not tolerating the BiPAP very well and went back to the CPAP last night. ? ?Hypertensive emergency-resolved as of 07/18/2021 ?Secondary to heart failure.  Improved with aggressive diuresis over the past week ? ?Asthma with COPD (HCC)-resolved as of 07/22/2021 ? ? ? ? ? ? ? ? ?Body mass index is 56.47 kg/m?.  ?  ?   ? ?Consultants: ?Nephrology ? ?Procedures: ?Echocardiogram done 2/27: Indeterminate diastolic function.  Preserved ejection fraction ? ?Antimicrobials: ?None ? ?Code Status: Full code ? ? ?Subjective: Patient feeling better, breathing is a little bit easier.  Legs are still slightly swollen ? ?Objective: ?Vital signs were reviewed and unremarkable. ?Vitals:  ? 07/25/21 1134 07/25/21 1254  ?BP: (!) 133/59   ?Pulse: 89   ?Resp: 19   ?Temp: 98.1 ?F (36.7 ?C)   ?SpO2: 97% 98%  ? ? ?Intake/Output Summary (Last 24 hours) at 07/25/2021 1303 ?Last data filed at 07/25/2021 1135 ?Gross per 24 hour  ?Intake 1640 ml  ?Output 2740 ml  ?Net -1100 ml  ? ?Filed Weights  ? 07/23/21 0310 07/24/21 7353 07/25/21 0555  ?Weight: (!) 152.2 kg (!) 149.7 kg (!) 149.2 kg  ? ?Body mass index is 56.47 kg/m?. ? ?Exam: ? ?General: Alert and oriented x3, no acute distress ?HEENT: Normocephalic, atraumatic, mucous membranes are.  Narrow airway ?Cardiovascular: Regular rate and rhythm, S1-S2 ?Respiratory: Decreased breath sounds throughout secondary  to body habitus ?Abdomen: Soft, obese, nontender, positive bowel sounds ?Musculoskeletal: No clubbing or cyanosis, 1+ pitting edema ?Skin: No skin breaks, tears or lesions ?Psychiatry: Appropriate, no evidence of psychoses ?Neurology: No focal deficits ? ?Data Reviewed: ?Creatinine continues to trend upward, currently at 2.45 ? ?Disposition:  ?Status is: Inpatient ?Remains inpatient appropriate because: Further diuresis, monitoring renal function.   Anticipate discharge in the next 24 to 48 hours ?  ? ?Family Communication: Son at the bedside ?DVT Prophylaxis: ?  Lovenox ? ? ? ?Author: ?Annita Brod ,MD ?07/25/2021 1:03 PM ? ?To reach On-call, see care teams to locate the attending and reach out via [email or web address]. ?Between 7PM-7AM, please contact night-coverage ?If you still have difficulty reaching the attending provider, please page the [facility] (Director on Call) for [facility] on amion for assistance. ? ?

## 2021-07-25 NOTE — Progress Notes (Signed)
[facility]  ROUNDING NOTE   Subjective:   Patient seated in chair No family at bedside Tolerating meals Continues to have shortness of breath during the night Also has intermittent nausea with meals, states reglan helps some.   Creatinine 2.5 Urine output 2.29L Co2 44 Cl 76  Objective:  Vital signs in last 24 hours:  Temp:  [97.7 F (36.5 C)-98.4 F (36.9 C)] 98.1 F (36.7 C) (03/08 1134) Pulse Rate:  [84-94] 89 (03/08 1134) Resp:  [18-20] 19 (03/08 1134) BP: (133-154)/(52-69) 133/59 (03/08 1134) SpO2:  [92 %-99 %] 98 % (03/08 1254) Weight:  [149.2 kg] 149.2 kg (03/08 0555)  Weight change: -0.454 kg Filed Weights   07/23/21 0310 07/24/21 0651 07/25/21 0555  Weight: (!) 152.2 kg (!) 149.7 kg (!) 149.2 kg    Intake/Output: I/O last 3 completed shifts: In: 7124 [P.O.:1420] Out: 2290 [Urine:2290]   Intake/Output this shift:  Total I/O In: 220 [P.O.:220] Out: 650 [Urine:650]  Physical Exam: General: NAD  Head: Normocephalic, atraumatic. Moist oral mucosal membranes, Cpap in situ  Eyes: Anicteric  Lungs:  Clear bilaterally, normal effort  Heart: Regular rate and rhythm  Abdomen:  Soft, nontender  Extremities:  1+ peripheral edema.  Neurologic: Nonfocal, moving all four extremities  Skin: No lesions       Basic Metabolic Panel: Recent Labs  Lab 07/21/21 0542 07/22/21 0451 07/23/21 0515 07/24/21 0025 07/25/21 0657  NA 137 133* 132* 130* 131*  K 4.0 4.0 4.4 4.1 4.2  CL 85* 77* 78* 75* 76*  CO2 41* 41* 41* 39* 44*  GLUCOSE 118* 124* 119* 129* 117*  BUN 7* 60* 67* 64* 66*  CREATININE 2.00* 1.97* 2.21* 2.20* 2.45*  CALCIUM 9.1 9.4 9.3 9.2 9.3  MG  --   --   --   --  1.7  PHOS  --   --   --  3.6 4.3     Liver Function Tests: Recent Labs  Lab 07/24/21 0025  ALBUMIN 3.3*    No results for input(s): LIPASE, AMYLASE in the last 168 hours. No results for input(s): AMMONIA in the last 168 hours.  CBC: Recent Labs  Lab  07/19/21 0629 07/21/21 0542 07/22/21 0451 07/25/21 0657  WBC 14.5* 12.7*  --  9.9  HGB 9.1* 9.1* 9.5* 9.6*  HCT 31.1* 30.2*  --  32.0*  MCV 76.8* 76.5*  --  75.7*  PLT 397 316  --  309     Cardiac Enzymes: No results for input(s): CKTOTAL, CKMB, CKMBINDEX, TROPONINI in the last 168 hours.  BNP: Invalid input(s): POCBNP  CBG: No results for input(s): GLUCAP in the last 168 hours.  Microbiology: Results for orders placed or performed during the hospital encounter of 07/15/21  Resp Panel by RT-PCR (Flu A&B, Covid) Nasopharyngeal Swab     Status: None   Collection Time: 07/15/21  4:36 AM   Specimen: Nasopharyngeal Swab; Nasopharyngeal(NP) swabs in vial transport medium  Result Value Ref Range Status   SARS Coronavirus 2 by RT PCR NEGATIVE NEGATIVE Final    Comment: (NOTE) SARS-CoV-2 target nucleic acids are NOT DETECTED.  The SARS-CoV-2 RNA is generally detectable in upper respiratory specimens during the acute phase of infection. The lowest concentration of SARS-CoV-2 viral copies this assay can detect is 138 copies/mL. A negative result does not preclude SARS-Cov-2 infection and should not be used as the sole basis for treatment or other patient management decisions. A negative result may occur with  improper specimen collection/handling, submission  of specimen other than nasopharyngeal swab, presence of viral mutation(s) within the areas targeted by this assay, and inadequate number of viral copies(<138 copies/mL). A negative result must be combined with clinical observations, patient history, and epidemiological information. The expected result is Negative.  Fact Sheet for Patients:  EntrepreneurPulse.com.au  Fact Sheet for Healthcare Providers:  IncredibleEmployment.be  This test is no t yet approved or cleared by the [place] FDA and  has been authorized for detection and/or diagnosis of SARS-CoV-2 by FDA under an  Emergency Use Authorization (EUA). This EUA will remain  in effect (meaning this test can be used) for the duration of the COVID-19 declaration under Section 564(b)(1) of the Act, 21 U.S.C.section 360bbb-3(b)(1), unless the authorization is terminated  or revoked sooner.       Influenza A by PCR NEGATIVE NEGATIVE Final   Influenza B by PCR NEGATIVE NEGATIVE Final    Comment: (NOTE) The Xpert Xpress SARS-CoV-2/FLU/RSV plus assay is intended as an aid in the diagnosis of influenza from Nasopharyngeal swab specimens and should not be used as a sole basis for treatment. Nasal washings and aspirates are unacceptable for Xpert Xpress SARS-CoV-2/FLU/RSV testing.  Fact Sheet for Patients: EntrepreneurPulse.com.au  Fact Sheet for Healthcare Providers: IncredibleEmployment.be  This test is not yet approved or cleared by the [place] FDA and has been authorized for detection and/or diagnosis of SARS-CoV-2 by FDA under an Emergency Use Authorization (EUA). This EUA will remain in effect (meaning this test can be used) for the duration of the COVID-19 declaration under Section 564(b)(1) of the Act, 21 U.S.C. section 360bbb-3(b)(1), unless the authorization is terminated or revoked.  Performed at [facility], [place], [place] [address]   Respiratory (~20 pathogens) panel by PCR     Status: None   Collection Time: 07/15/21  6:00 PM   Specimen: Nasopharyngeal Swab; Respiratory  Result Value Ref Range Status   Adenovirus NOT DETECTED NOT DETECTED Final   Coronavirus 229E NOT DETECTED NOT DETECTED Final    Comment: (NOTE) The Coronavirus on the Respiratory Panel, DOES NOT test for the novel  Coronavirus (2019 nCoV)    Coronavirus HKU1 NOT DETECTED NOT DETECTED Final   Coronavirus NL63 NOT DETECTED NOT DETECTED Final   Coronavirus OC43 NOT DETECTED NOT DETECTED Final   Metapneumovirus NOT DETECTED NOT DETECTED Final    Rhinovirus / Enterovirus NOT DETECTED NOT DETECTED Final   Influenza A NOT DETECTED NOT DETECTED Final   Influenza B NOT DETECTED NOT DETECTED Final   Parainfluenza Virus 1 NOT DETECTED NOT DETECTED Final   Parainfluenza Virus 2 NOT DETECTED NOT DETECTED Final   Parainfluenza Virus 3 NOT DETECTED NOT DETECTED Final   Parainfluenza Virus 4 NOT DETECTED NOT DETECTED Final   Respiratory Syncytial Virus NOT DETECTED NOT DETECTED Final   Bordetella pertussis NOT DETECTED NOT DETECTED Final   Bordetella Parapertussis NOT DETECTED NOT DETECTED Final   Chlamydophila pneumoniae NOT DETECTED NOT DETECTED Final   Mycoplasma pneumoniae NOT DETECTED NOT DETECTED Final    Comment: Performed at [facility], [place]. [address] [place], [place] [address]  Expectorated Sputum Assessment w Gram Stain, Rflx to Resp Cult     Status: None   Collection Time: 07/19/21 10:45 PM   Specimen: Sputum  Result Value Ref Range Status   Specimen Description SPUTUM  Final   Special Requests NONE  Final   Sputum evaluation   Final    Sputum specimen not acceptable for testing.  Please  recollect.   RESULT CALLED TO, READ BACK BY AND VERIFIED WITH: ROSE NGU '@2342'$  ON 07/19/21 SKL Performed at [facility], [address] [place], [place] [address]    Report Status 07/19/2021 FINAL  Final    Coagulation Studies: No results for input(s): LABPROT, INR in the last 72 hours.  Urinalysis: No results for input(s): COLORURINE, LABSPEC, PHURINE, GLUCOSEU, HGBUR, BILIRUBINUR, KETONESUR, PROTEINUR, UROBILINOGEN, NITRITE, LEUKOCYTESUR in the last 72 hours.  Invalid input(s): APPERANCEUR     Imaging: US Abdomen Limited RUQ (LIVER/GB)  Result Date: 07/24/2021 CLINICAL DATA:  Nausea EXAM: ULTRASOUND ABDOMEN LIMITED RIGHT UPPER QUADRANT COMPARISON:  Abdominal CT 08/30/2010 FINDINGS: Gallbladder: History of cholecystectomy. Common bile duct: Diameter: 6 mm. Very limited visualization of the ducts due to body habitus.  Liver: Diffusely echogenic with diminished acoustic penetration. No evidence of mass lesion. Portal vein is patent on color Doppler imaging with normal direction of blood flow towards the liver. IMPRESSION: 1. No acute finding.  Remote cholecystectomy. 2. Hepatic steatosis. Electronically Signed   By: Jorje Guild M.D.   On: 07/24/2021 10:32     Medications:       aspirin EC  81 mg Oral Daily   chlorhexidine  15 mL Mouth Rinse BID   cholestyramine  4 g Oral QHS   diltiazem  180 mg Oral Daily   enoxaparin (LOVENOX) injection  0.5 mg/kg Subcutaneous Q24H   ipratropium-albuterol  3 mL Nebulization BID   loratadine  10 mg Oral Daily   mouth rinse  15 mL Mouth Rinse q12n4p   melatonin  5 mg Oral QHS   metoCLOPramide (REGLAN) injection  5 mg Intravenous TID AC   nystatin   Topical TID   pantoprazole  40 mg Oral BID   rOPINIRole  1 mg Oral TID   [START ON 07/26/2021] torsemide  40 mg Oral Daily   acetaminophen **OR** acetaminophen, albuterol, dextromethorphan-guaiFENesin, hydrALAZINE, loperamide, ondansetron (ZOFRAN) IV  Assessment/ Plan:  Ms. Samantha Mason is a 71 y.o.  female with COPD/asthma, congestive heart failure, obstructive sleep apnea, IBS, hypertension, who was admitted to [facility] on 07/15/2021 for COPD exacerbation ([place]) [J44.1] Acute on chronic diastolic congestive heart failure ([place]) [I50.33] Hypertensive emergency [I16.1] Acute on chronic respiratory failure with hypoxia ([place]) [J96.21] Acute respiratory failure with hypoxia and hypercapnia ([place]) [J96.01, J96.02]   Acute kidney injury on chronic kidney disease stage IIIB: baseline creatinine of 1.36, GFR of 42 on 03/10/2021. History of bland urine. Chronic kidney disease secondary to hypertension. Acute kidney injury secondary to acute cardiorenal syndrome. No IV contrast exposure. Renal ultrasound with no obstruction.  -Creatinine slightly increased today. Urine output remains adequate. Will transition to Torsemide '40mg'$   daily. IV Furosemide stopped.   Lab Results  Component Value Date   CREATININE 2.45 (H) 07/25/2021   CREATININE 2.20 (H) 07/24/2021   CREATININE 2.21 (H) 07/23/2021    Intake/Output Summary (Last 24 hours) at 07/25/2021 1308 Last data filed at 07/25/2021 1135 Gross per 24 hour  Intake 1640 ml  Output 2740 ml  Net -1100 ml    2.  Acute on chronic diastolic congestive heart failure: echocardiogram riewed. Patient has failed outpatient therapy with PO furosemide.  -IV Furosemide stopped. Torsemide ordered as above  Hypertension:Home regimen includes olmesartan, furosemide PRN, and diltiazem.  -Transitioned to Torsemide - Continue diltiazem - holding olmesartan as above.    Acute exacerbation of COPD: Continue supportive care and supplemental oxygen.  5. Anemia of chronic disease And iron deficiency anemia  patient did  receive IV iron during the hospital admission  6. Acute metabolic acidosis likely due to prolonged use of IV Furosemide.    IV furosemide stopped. Will order Torsemide '40mg'$  daily.     LOS: [place] 3/8/20231:08 PM

## 2021-07-25 NOTE — Assessment & Plan Note (Signed)
Secondary to heart failure.  Improved with aggressive diuresis over the past week ?

## 2021-07-26 ENCOUNTER — Other Ambulatory Visit: Payer: Self-pay | Admitting: Internal Medicine

## 2021-07-26 DIAGNOSIS — E871 Hypo-osmolality and hyponatremia: Secondary | ICD-10-CM | POA: Diagnosis present

## 2021-07-26 LAB — BASIC METABOLIC PANEL
Anion gap: 14 (ref 5–15)
BUN: 64 mg/dL — ABNORMAL HIGH (ref 8–23)
CO2: 41 mmol/L — ABNORMAL HIGH (ref 22–32)
Calcium: 9.6 mg/dL (ref 8.9–10.3)
Chloride: 77 mmol/L — ABNORMAL LOW (ref 98–111)
Creatinine, Ser: 2.47 mg/dL — ABNORMAL HIGH (ref 0.44–1.00)
GFR, Estimated: 21 mL/min — ABNORMAL LOW (ref >60)
Glucose, Bld: 113 mg/dL — ABNORMAL HIGH (ref 70–99)
Potassium: 4.5 mmol/L (ref 3.5–5.1)
Sodium: 132 mmol/L — ABNORMAL LOW (ref 135–145)

## 2021-07-26 NOTE — Assessment & Plan Note (Signed)
Patient has stage IIIa chronic kidney disease.  Renal function has improved to this point. ?

## 2021-07-26 NOTE — Telephone Encounter (Signed)
Requested medication (s) are due for refill today: Due 08/10/21 ? ?Requested medication (s) are on the active medication list: yes   ? ?Last refill: 06/12/21  #30  1 refill ? ?Future visit scheduled yes 02/21/22 ? ?Notes to clinic:not delegated, please review. Thank you. ? ?Requested Prescriptions  ?Pending Prescriptions Disp Refills  ? promethazine (PHENERGAN) 25 MG tablet [Pharmacy Med Name: PROMETHAZINE 25 MG TABLET] 30 tablet 1  ?  Sig: TAKE 1 TABLET BY MOUTH EVERY 6 HOURS AS NEEDED FOR NAUSEA OR VOMITING.  ?  ? Not Delegated - Gastroenterology: Antiemetics Failed - 07/26/2021 12:17 AM  ?  ?  Failed - This refill cannot be delegated  ?  ?  Passed - Valid encounter within last 6 months  ?  Recent Outpatient Visits   ? ?      ? 5 months ago Essential hypertension  ? [facility] Glean Hess, MD  ? 9 months ago Asthma in adult, moderate persistent, with acute exacerbation  ? [facility] Glean Hess, MD  ? 1 year ago Annual physical exam  ? [facility] Glean Hess, MD  ? 2 years ago Annual physical exam  ? [facility] Glean Hess, MD  ? 2 years ago Essential hypertension  ? [facility] Glean Hess, MD  ? ?  ?  ?Future Appointments   ? ?        ? In 7 months Army Melia Jesse Sans, MD [facility] [place]  ? ?  ? ?  ?  ?  ?Signed Prescriptions Disp Refills  ? rOPINIRole (REQUIP) 1 MG tablet 90 tablet 0  ?  Sig: TAKE 1 TABLET BY MOUTH THREE TIMES A DAY AS NEEDED  ?  ? Neurology:  Parkinsonian Agents Failed - 07/26/2021 12:17 AM  ?  ?  Failed - Last BP in normal range  ?  BP Readings from Last 1 Encounters:  ?07/26/21 (!) 143/63  ?  ?  ?  ?  Passed - Last Heart Rate in normal range  ?  Pulse Readings from Last 1 Encounters:  ?07/26/21 91  ?  ?  ?  ?  Passed - Valid encounter within last 12 months  ?  Recent Outpatient Visits   ? ?      ? 5 months ago Essential hypertension  ? [facility] Glean Hess, MD  ? 9 months ago Asthma in  adult, moderate persistent, with acute exacerbation  ? [facility] Glean Hess, MD  ? 1 year ago Annual physical exam  ? [facility] Glean Hess, MD  ? 2 years ago Annual physical exam  ? [facility] Glean Hess, MD  ? 2 years ago Essential hypertension  ? [facility] Glean Hess, MD  ? ?  ?  ?Future Appointments   ? ?        ? In 7 months Army Melia Jesse Sans, MD [facility], [place]  ? ?  ? ?  ?  ?  ? ? ? ? ?

## 2021-07-26 NOTE — Progress Notes (Signed)
Mobility Specialist - Progress Note ? ? ? 07/26/21 1155  ?Mobility  ?Activity Refused mobility  ? ? ?Pt refused session d/t lunch. Will attempt session at later date and time. ? ?Merrily Brittle ?Mobility Specialist ?07/26/21, 11:56 AM ? ? ? ? ?

## 2021-07-26 NOTE — Assessment & Plan Note (Signed)
Hypervolemic hyponatremia in the context of heart failure. ?

## 2021-07-26 NOTE — Progress Notes (Signed)
Called into patient's room for sudden onset SOB and chest tightness. Patient reports walking to bathroom and back and becoming SOB. Duoneb given as scheduled, patient reports relief. Maintain O2 sats at 96-97% on 2L. ?

## 2021-07-26 NOTE — Progress Notes (Addendum)
[facility] Progress Note ? ?Patient: Samantha Mason    QHU:[id number]  DOA: 07/15/2021    ?Date of Service: the patient was seen and examined on 07/26/2021 ? ?Brief hospital course: ?Samantha Mason is a 71 y.o. F with diastolic CHF, and COPD with chronic respiratory failure on 2 L nasal cannula as well as obstructive sleep apnea on CPAP, stage IIIb chronic kidney disease, hypertension and morbid obesity who presented to the emergency room on 2/26 for leg swelling and shortness of breath and found to be acute diastolic heart failure.  The patient's baseline weight is 322 pounds and she came in at 359 pounds.   ? ?Patient has been undergoing IV diuresis with Lasix and is now down to 328 pounds. ? ?Assessment and Plan: ?Assessment and Plan: ?* Acute on chronic respiratory failure with hypoxia and hypercapnia (HCC) ?Transitional care working on setting up NIV for home.  Patient on baseline 2 L of oxygen.  Initially required BiPAP for respiratory distress.  Unable to tolerate the BiPAP and was switched over to CPAP because we do not have an IV here. ? ?Nausea ?Improving.  Right upper quadrant ultrasound negative.  Added Reglan today. ? ?Acute on chronic diastolic CHF (congestive heart failure) ([place]) ?Patient on Lasix 60 mg IV twice a day.  Patient's baseline weight around 322.  Weight this morning of 328.  We will continue diuresis until closer to baseline of dry weight. ? ? ?COPD exacerbation ([place]) ?Likely CHF and not COPD.  Continue nebulizers as needed ? ?Acute kidney injury superimposed on chronic kidney disease ([place]) ?Creatinine up at 2.47 today.  Continue to watch closely with diuresis.  Nephrology following ? ? ?Essential hypertension ?Continue Cardizem CD and Lasix.   ? ?Microcytic anemia ?Hemoglobin remaining stable, today at 9.6.  Received IV iron during the hospital course. ? ?History of stroke ?LDL 68, Continue low-dose aspirin ? ? ?Morbid obesity ([place]) ?Meets criteria with BMI greater than  40 ? ?Hyponatremia ?Hypervolemic hyponatremia in the context of heart failure. ? ?Insomnia ?Trial of melatonin ? ?Stage 3a chronic kidney disease (CKD) ([place]) ?Patient has stage IIIa chronic kidney disease.  Renal function has improved to this point. ? ?OSA on CPAP ?Patient is at home CPAP not working.  TOC set up noninvasive ventilation at night.  Currently not tolerating the BiPAP very well and went back to the CPAP last night. ? ?Hypertensive emergency-resolved as of 07/18/2021 ?Secondary to heart failure.  Improved with aggressive diuresis over the past week ? ?Asthma with COPD (HCC)-resolved as of 07/22/2021 ? ? ? ? ? ? ? ? ?Body mass index is 56.39 kg/m?.  ?  ?   ? ?Consultants: ?Nephrology ? ?Procedures: ?Echocardiogram done 2/27: Indeterminate diastolic function.  Preserved ejection fraction ? ?Antimicrobials: ?None ? ?Code Status: Full code ? ? ?Subjective: Gets a little short of breath with activity, otherwise okay ? ?Objective: ?Vital signs were reviewed and unremarkable. ?Vitals:  ? 07/26/21 1125 07/26/21 1603  ?BP: (!) 143/63 (!) 169/78  ?Pulse: 91 92  ?Resp: 17 18  ?Temp: 98.1 ?F (36.7 ?C) 98.9 ?F (37.2 ?C)  ?SpO2: 94% 98%  ? ? ?Intake/Output Summary (Last 24 hours) at 07/26/2021 1649 ?Last data filed at 07/26/2021 1407 ?Gross per 24 hour  ?Intake 380 ml  ?Output 300 ml  ?Net 80 ml  ? ?Filed Weights  ? 07/24/21 0651 07/25/21 0555 07/26/21 0423  ?Weight: (!) 149.7 kg (!) 149.2 kg (!) 149 kg  ? ?Body mass index is 56.39 kg/m?Marland Kitchen ? ?  Exam: ? ?General: Alert and oriented x3, no acute distress ?HEENT: Normocephalic, atraumatic, mucous membranes are.  Narrow airway ?Cardiovascular: Regular rate and rhythm, S1-S2 ?Respiratory: Decreased breath sounds throughout secondary to body habitus ?Abdomen: Soft, obese, nontender, positive bowel sounds ?Musculoskeletal: No clubbing or cyanosis, 1+ pitting edema ?Skin: No skin breaks, tears or lesions ?Psychiatry: Appropriate, no evidence of psychoses ?Neurology: No focal  deficits ? ?Data Reviewed: ?creatinine today at 2.47 ? ?Disposition:  ?Status is: Inpatient ?Remains inpatient appropriate because: Further diuresis, monitoring renal function.  Anticipate discharge in the next 24 to 48 hours ?  ? ?Family Communication: Left message for son  ?DVT Prophylaxis: ?  Lovenox ? ? ? ?Author: ?Annita Brod ,MD ?07/26/2021 4:49 PM ? ?To reach On-call, see care teams to locate the attending and reach out via [email or web address]. ?Between 7PM-7AM, please contact night-coverage ?If you still have difficulty reaching the attending provider, please page the [facility] (Director on Call) for [facility] on amion for assistance. ? ?

## 2021-07-26 NOTE — TOC Progression Note (Signed)
Transition of Care (TOC) - Progression Note  ? ? ?Patient Details  ?Name: Samantha Mason ?MRN: [id number] ?Date of Birth: 07-16-1950 ? ?Transition of Care (TOC) CM/SW Contact  ?Alberteen Sam, LCSW ?Phone Number: ?07/26/2021, 10:28 AM ? ?Clinical Narrative:    ? ?CSW notes [employer] has delivered patient's trilogy to room for patient to use while inpatient and to take home at time of discharge.  ? ?Will continue to follow for any additional needs.  ? ? ?  ?  ? ?Expected Discharge Plan and Services ?  ?  ?  ?  ?  ?                ?  ?  ?  ?  ?  ?  ?  ?  ?  ?  ? ? ?Social Determinants of Health (SDOH) Interventions ?  ? ?Readmission Risk Interventions ?No flowsheet data found. ? ?

## 2021-07-26 NOTE — Progress Notes (Signed)
Patient on her home cpap with 2 liters for now. Was resistant to keep on as she stated she was waiting on meds from RN.  Advised her to just keep mask on to allow her self to get comfortable with her equipment and then take off when time comes. She agreed to do so. Resting well when I left.  ?

## 2021-07-26 NOTE — Progress Notes (Addendum)
[facility]  ROUNDING NOTE   Subjective:   Patient seen sitting up in chair, alert and oriented Tolerating meals without nausea and vomiting Reports dyspnea on exertion, remains on supplemental oxygen Attempted to use personal CPAP overnight, tolerated fair.  States she needs more training Lower extremity edema improved, less tight  Creatinine stable Urine output 950 mL in preceding 24 hours  Objective:  Vital signs in last 24 hours:  Temp:  [97.6 F (36.4 C)-98.3 F (36.8 C)] 98.1 F (36.7 C) (03/09 1125) Pulse Rate:  [90-99] 91 (03/09 1125) Resp:  [17-20] 17 (03/09 1125) BP: (121-176)/(56-73) 143/63 (03/09 1125) SpO2:  [94 %-97 %] 94 % (03/09 1125) Weight:  [149 kg] 149 kg (03/09 0423)  Weight change: -0.227 kg Filed Weights   07/24/21 0651 07/25/21 0555 07/26/21 0423  Weight: (!) 149.7 kg (!) 149.2 kg (!) 149 kg    Intake/Output: I/O last 3 completed shifts: In: 840 [P.O.:840] Out: 2090 [Urine:2090]   Intake/Output this shift:  Total I/O In: 380 [P.O.:380] Out: -   Physical Exam: General: NAD  Head: Normocephalic, atraumatic. Moist oral mucosal membranes, Cpap in situ  Eyes: Anicteric  Lungs:  Clear bilaterally, normal effort  Heart: Regular rate and rhythm  Abdomen:  Soft, nontender  Extremities:  1+ peripheral edema.  Neurologic: Nonfocal, moving all four extremities  Skin: No lesions, dry bilateral lower extremities       Basic Metabolic Panel: Recent Labs  Lab 07/22/21 0451 07/23/21 0515 07/24/21 0025 07/25/21 0657 07/26/21 0556  NA 133* 132* 130* 131* 132*  K 4.0 4.4 4.1 4.2 4.5  CL 77* 78* 75* 76* 77*  CO2 41* 41* 39* 44* 41*  GLUCOSE 124* 119* 129* 117* 113*  BUN 60* 67* 64* 66* 64*  CREATININE 1.97* 2.21* 2.20* 2.45* 2.47*  CALCIUM 9.4 9.3 9.2 9.3 9.6  MG  --   --   --  1.7  --   PHOS  --   --  3.6 4.3  --      Liver Function Tests: Recent Labs  Lab 07/24/21 0025  ALBUMIN 3.3*    No results for input(s):  LIPASE, AMYLASE in the last 168 hours. No results for input(s): AMMONIA in the last 168 hours.  CBC: Recent Labs  Lab 07/21/21 0542 07/22/21 0451 07/25/21 0657  WBC 12.7*  --  9.9  HGB 9.1* 9.5* 9.6*  HCT 30.2*  --  32.0*  MCV 76.5*  --  75.7*  PLT 316  --  309     Cardiac Enzymes: No results for input(s): CKTOTAL, CKMB, CKMBINDEX, TROPONINI in the last 168 hours.  BNP: Invalid input(s): POCBNP  CBG: No results for input(s): GLUCAP in the last 168 hours.  Microbiology: Results for orders placed or performed during the hospital encounter of 07/15/21  Resp Panel by RT-PCR (Flu A&B, Covid) Nasopharyngeal Swab     Status: None   Collection Time: 07/15/21  4:36 AM   Specimen: Nasopharyngeal Swab; Nasopharyngeal(NP) swabs in vial transport medium  Result Value Ref Range Status   SARS Coronavirus 2 by RT PCR NEGATIVE NEGATIVE Final    Comment: (NOTE) SARS-CoV-2 target nucleic acids are NOT DETECTED.  The SARS-CoV-2 RNA is generally detectable in upper respiratory specimens during the acute phase of infection. The lowest concentration of SARS-CoV-2 viral copies this assay can detect is 138 copies/mL. A negative result does not preclude SARS-Cov-2 infection and should not be used as the sole basis for treatment or other patient management decisions.  A negative result may occur with  improper specimen collection/handling, submission of specimen other than nasopharyngeal swab, presence of viral mutation(s) within the areas targeted by this assay, and inadequate number of viral copies(<138 copies/mL). A negative result must be combined with clinical observations, patient history, and epidemiological information. The expected result is Negative.  Fact Sheet for Patients:  EntrepreneurPulse.com.au  Fact Sheet for Healthcare Providers:  IncredibleEmployment.be  This test is no t yet approved or cleared by the [place] FDA and  has been  authorized for detection and/or diagnosis of SARS-CoV-2 by FDA under an Emergency Use Authorization (EUA). This EUA will remain  in effect (meaning this test can be used) for the duration of the COVID-19 declaration under Section 564(b)(1) of the Act, 21 U.S.C.section 360bbb-3(b)(1), unless the authorization is terminated  or revoked sooner.       Influenza A by PCR NEGATIVE NEGATIVE Final   Influenza B by PCR NEGATIVE NEGATIVE Final    Comment: (NOTE) The Xpert Xpress SARS-CoV-2/FLU/RSV plus assay is intended as an aid in the diagnosis of influenza from Nasopharyngeal swab specimens and should not be used as a sole basis for treatment. Nasal washings and aspirates are unacceptable for Xpert Xpress SARS-CoV-2/FLU/RSV testing.  Fact Sheet for Patients: EntrepreneurPulse.com.au  Fact Sheet for Healthcare Providers: IncredibleEmployment.be  This test is not yet approved or cleared by the [place] FDA and has been authorized for detection and/or diagnosis of SARS-CoV-2 by FDA under an Emergency Use Authorization (EUA). This EUA will remain in effect (meaning this test can be used) for the duration of the COVID-19 declaration under Section 564(b)(1) of the Act, 21 U.S.C. section 360bbb-3(b)(1), unless the authorization is terminated or revoked.  Performed at [facility], [place], [place] [address]   Respiratory (~20 pathogens) panel by PCR     Status: None   Collection Time: 07/15/21  6:00 PM   Specimen: Nasopharyngeal Swab; Respiratory  Result Value Ref Range Status   Adenovirus NOT DETECTED NOT DETECTED Final   Coronavirus 229E NOT DETECTED NOT DETECTED Final    Comment: (NOTE) The Coronavirus on the Respiratory Panel, DOES NOT test for the novel  Coronavirus (2019 nCoV)    Coronavirus HKU1 NOT DETECTED NOT DETECTED Final   Coronavirus NL63 NOT DETECTED NOT DETECTED Final   Coronavirus OC43 NOT DETECTED NOT  DETECTED Final   Metapneumovirus NOT DETECTED NOT DETECTED Final   Rhinovirus / Enterovirus NOT DETECTED NOT DETECTED Final   Influenza A NOT DETECTED NOT DETECTED Final   Influenza B NOT DETECTED NOT DETECTED Final   Parainfluenza Virus 1 NOT DETECTED NOT DETECTED Final   Parainfluenza Virus 2 NOT DETECTED NOT DETECTED Final   Parainfluenza Virus 3 NOT DETECTED NOT DETECTED Final   Parainfluenza Virus 4 NOT DETECTED NOT DETECTED Final   Respiratory Syncytial Virus NOT DETECTED NOT DETECTED Final   Bordetella pertussis NOT DETECTED NOT DETECTED Final   Bordetella Parapertussis NOT DETECTED NOT DETECTED Final   Chlamydophila pneumoniae NOT DETECTED NOT DETECTED Final   Mycoplasma pneumoniae NOT DETECTED NOT DETECTED Final    Comment: Performed at [facility], [place]. [address], [place],  [address]  Expectorated Sputum Assessment w Gram Stain, Rflx to Resp Cult     Status: None   Collection Time: 07/19/21 10:45 PM   Specimen: Sputum  Result Value Ref Range Status   Specimen Description SPUTUM  Final   Special Requests NONE  Final   Sputum evaluation   Final  Sputum specimen not acceptable for testing.  Please recollect.   RESULT CALLED TO, READ BACK BY AND VERIFIED WITH: ROSE NGU '@[id number]'$  ON 07/19/21 SKL Performed at [facility], [address] [place], [place] [address]    Report Status 07/19/2021 FINAL  Final    Coagulation Studies: No results for input(s): LABPROT, INR in the last 72 hours.  Urinalysis: No results for input(s): COLORURINE, LABSPEC, PHURINE, GLUCOSEU, HGBUR, BILIRUBINUR, KETONESUR, PROTEINUR, UROBILINOGEN, NITRITE, LEUKOCYTESUR in the last 72 hours.  Invalid input(s): APPERANCEUR     Imaging: No results found.   Medications:       aspirin EC  81 mg Oral Daily   chlorhexidine  15 mL Mouth Rinse BID   cholestyramine  4 g Oral QHS   diltiazem  180 mg Oral Daily   enoxaparin (LOVENOX) injection  0.5 mg/kg Subcutaneous Q24H    ipratropium-albuterol  3 mL Nebulization BID   loratadine  10 mg Oral Daily   mouth rinse  15 mL Mouth Rinse q12n4p   melatonin  5 mg Oral QHS   metoCLOPramide (REGLAN) injection  5 mg Intravenous TID AC   nystatin   Topical TID   pantoprazole  40 mg Oral BID   rOPINIRole  1 mg Oral TID   torsemide  40 mg Oral Daily   acetaminophen **OR** acetaminophen, albuterol, dextromethorphan-guaiFENesin, hydrALAZINE, loperamide, ondansetron (ZOFRAN) IV  Assessment/ Plan:  Ms. CHRISSI CROW is a 71 y.o.  female with COPD/asthma, congestive heart failure, obstructive sleep apnea, IBS, hypertension, who was admitted to [facility] on 07/15/2021 for COPD exacerbation ([place]) [J44.1] Acute on chronic diastolic congestive heart failure (HCC) [I50.33] Hypertensive emergency [I16.1] Acute on chronic respiratory failure with hypoxia (HCC) [J96.21] Acute respiratory failure with hypoxia and hypercapnia (HCC) [J96.01, J96.02]   Acute kidney injury on chronic kidney disease stage IIIB: baseline creatinine of 1.36, GFR of 42 on 03/10/2021. History of bland urine. Chronic kidney disease secondary to hypertension. Acute kidney injury secondary to acute cardiorenal syndrome. No IV contrast exposure. Renal ultrasound with no obstruction.  -Renal function stable today.  IV furosemide transitioned to oral torsemide 40 mg daily.  Patient will need follow-up in our office at discharge.  Lab Results  Component Value Date   CREATININE 2.47 (H) 07/26/2021   CREATININE 2.45 (H) 07/25/2021   CREATININE 2.20 (H) 07/24/2021    Intake/Output Summary (Last 24 hours) at 07/26/2021 1456 Last data filed at 07/26/2021 1407 Gross per 24 hour  Intake 380 ml  Output 300 ml  Net 80 ml    2.  Acute on chronic diastolic congestive heart failure with LE edema -  echocardiogram riewed. Patient has failed outpatient therapy with PO furosemide.  -Torsemide ordered as above  Hypertension:Home regimen includes olmesartan, furosemide PRN, and  diltiazem.  -Transitioned to Torsemide - Continue diltiazem - holding olmesartan as above.    Acute exacerbation of COPD: Continue supportive care and supplemental oxygen.  5. Anemia of chronic disease and iron deficiency anemia  IV iron received this admission.  Hemoglobin currently 9.6  6. Acute Metabolic alkalosis-  likely due to hypercapnic resp failure and prolonged use of IV Furosemide.     - Torsemide '40mg'$  daily.   - CPAP therapy    LOS: 11 Shantelle Breeze 3/9/20232:56 PM   Patient was examined and evaluated with Colon Flattery, NP.  Plan of care was formulated and discussed with patient as well as NP.  I agree with the note as documented with edits.

## 2021-07-26 NOTE — Progress Notes (Signed)
Mobility Specialist - Progress Note ? ? ? 07/26/21 1400  ?Mobility  ?Range of Motion/Exercises All extremities  ?Level of Assistance Modified independent, requires aide device or extra time  ?Assistive Device None  ?Distance Ambulated (ft) 0 ft  ?Activity Response Tolerated well  ?$Mobility charge 1 Mobility  ? ? ?Pre-mobility: 93 HR, BP, 96% SpO2 ?During mobility: 94 HR, BP, 93% SpO2 ? ?Pt sitting in chair upon arrival using 2L. Pt completes BLE Therex ---- 3 pt initiated breaks d/t SOB. Pt left in chair with needs in reach and family at bedside. ? ?Samantha Mason ?Mobility Specialist ?07/26/21, 2:12 PM ? ? ? ? ?

## 2021-07-26 NOTE — Telephone Encounter (Signed)
Requested Prescriptions  ?Pending Prescriptions Disp Refills  ?? rOPINIRole (REQUIP) 1 MG tablet [Pharmacy Med Name: ROPINIROLE HCL 1 MG TABLET] 90 tablet 0  ?  Sig: TAKE 1 TABLET BY MOUTH THREE TIMES A DAY AS NEEDED  ?  ? Neurology:  Parkinsonian Agents Failed - 07/26/2021 12:17 AM  ?  ?  Failed - Last BP in normal range  ?  BP Readings from Last 1 Encounters:  ?07/26/21 (!) 143/63  ?   ?  ?  Passed - Last Heart Rate in normal range  ?  Pulse Readings from Last 1 Encounters:  ?07/26/21 91  ?   ?  ?  Passed - Valid encounter within last 12 months  ?  Recent Outpatient Visits   ?      ? 5 months ago Essential hypertension  ? [facility] Glean Hess, MD  ? 9 months ago Asthma in adult, moderate persistent, with acute exacerbation  ? [facility] Glean Hess, MD  ? 1 year ago Annual physical exam  ? [facility] Glean Hess, MD  ? 2 years ago Annual physical exam  ? [facility] Glean Hess, MD  ? 2 years ago Essential hypertension  ? [facility] Glean Hess, MD  ?  ?  ?Future Appointments   ?        ? In 7 months Army Melia Jesse Sans, MD [facility]  ?  ? ?  ?  ?  ?? promethazine (PHENERGAN) 25 MG tablet [Pharmacy Med Name: PROMETHAZINE 25 MG TABLET] 30 tablet 1  ?  Sig: TAKE 1 TABLET BY MOUTH EVERY 6 HOURS AS NEEDED FOR NAUSEA OR VOMITING.  ?  ? Not Delegated - Gastroenterology: Antiemetics Failed - 07/26/2021 12:17 AM  ?  ?  Failed - This refill cannot be delegated  ?  ?  Passed - Valid encounter within last 6 months  ?  Recent Outpatient Visits   ?      ? 5 months ago Essential hypertension  ? [facility] Glean Hess, MD  ? 9 months ago Asthma in adult, moderate persistent, with acute exacerbation  ? [facility] Glean Hess, MD  ? 1 year ago Annual physical exam  ? [facility] Glean Hess, MD  ? 2 years ago Annual physical exam  ? [facility] Glean Hess, MD  ? 2 years  ago Essential hypertension  ? [facility] Glean Hess, MD  ?  ?  ?Future Appointments   ?        ? In 7 months Army Melia Jesse Sans, MD [facility] [place]  ?  ? ?  ?  ?  ? ? ?

## 2021-07-27 LAB — BASIC METABOLIC PANEL
Anion gap: 8 (ref 5–15)
BUN: 68 mg/dL — ABNORMAL HIGH (ref 8–23)
CO2: 41 mmol/L — ABNORMAL HIGH (ref 22–32)
Calcium: 9.2 mg/dL (ref 8.9–10.3)
Chloride: 80 mmol/L — ABNORMAL LOW (ref 98–111)
Creatinine, Ser: 2.61 mg/dL — ABNORMAL HIGH (ref 0.44–1.00)
GFR, Estimated: 19 mL/min — ABNORMAL LOW (ref >60)
Glucose, Bld: 125 mg/dL — ABNORMAL HIGH (ref 70–99)
Potassium: 4.1 mmol/L (ref 3.5–5.1)
Sodium: 129 mmol/L — ABNORMAL LOW (ref 135–145)

## 2021-07-27 MED ORDER — ROPINIROLE HCL 1 MG PO TABS
1.0000 mg | ORAL_TABLET | Freq: Three times a day (TID) | ORAL | 1 refills | Status: DC
Start: 2021-07-27 — End: 2021-10-24

## 2021-07-27 MED ORDER — ASPIRIN 81 MG PO TBEC: 81.0000 mg | DELAYED_RELEASE_TABLET | Freq: Every day | ORAL | 11 refills | Status: DC

## 2021-07-27 MED ORDER — TORSEMIDE 40 MG PO TABS: 40.0000 mg | ORAL_TABLET | Freq: Every day | ORAL | 1 refills | Status: DC

## 2021-07-27 MED ORDER — NYSTATIN 100000 UNIT/GM EX POWD: Freq: Three times a day (TID) | CUTANEOUS | 0 refills | Status: DC

## 2021-07-27 NOTE — TOC Transition Note (Addendum)
Transition of Care (TOC) - CM/SW Discharge Note ? ? ?Patient Details  ?Name: Samantha Mason ?MRN: [id number] ?Date of Birth: 1951-03-11 ? ?Transition of Care (TOC) CM/SW Contact:  ?Alberteen Sam, LCSW ?Phone Number: ?07/27/2021, 2:24 PM ? ? ?Clinical Narrative:    ? ?Patient to dc home today, was set up with [facility] through [facility], is in room for patient to take home. Patient is set up with home health services for PT and RN through [place].  ? ?Start of care 3/13.  ? ?No further dc needs identified at this time.  ? ? ?Final next level of care: [place] ?Barriers to Discharge: No Barriers Identified ? ? ?Patient Goals and CMS Choice ?Patient states their goals for this hospitalization and ongoing recovery are:: to go home ?CMS Medicare.gov Compare Post Acute Care list provided to:: Patient ?Choice offered to / list presented to : Patient ? ?Discharge Placement ?  ?           ?  ?  ?  ?  ? ?Discharge Plan and Services ?  ?  ?           ?DME Arranged: NIV ?DME Agency: [facility] ?  ?  ?  ?HH Arranged: PT, RN ?Interior Agency: [facility] [place] [facility] ?Date HH Agency Contacted: 07/27/21 ?Time [place]: 9012 ?Representative spoke with at [place]: Corene Cornea ? ?Social Determinants of Health (SDOH) Interventions ?  ? ? ?Readmission Risk Interventions ?No flowsheet data found. ? ? ? ? ?

## 2021-07-27 NOTE — Progress Notes (Signed)
Mobility Specialist - Progress Note ? ? 07/27/21 1200  ?Mobility  ?Activity Stood at bedside;Dangled on edge of bed  ?Range of Motion/Exercises Right leg;Left leg  ?Level of Assistance Independent  ?Assistive Device None  ?Distance Ambulated (ft) 0 ft  ?Activity Response Tolerated well  ?$Mobility charge 1 Mobility  ? ? ?Pre-mobility: 93 HR, BP, 95% SpO2 ?During mobility: 98 HR, BP,92% SpO2 ? ?Pt in chair upon arrival using 2L [place]. Pt completes 5 STS indep --- one pt initiated break after third set d/t voiced SOB but continues for 2 more reps. Completes BLE Therex voicing no complaints. Left in chair with needs in reach and family at bedside. ? ?Merrily Brittle ?Mobility Specialist ?07/27/21, 12:48 PM ? ? ? ?

## 2021-07-27 NOTE — Progress Notes (Signed)
[facility]  ROUNDING NOTE   Subjective:   Patient sitting at side of bed Alert and oriented Denies pain and discomfort Complains of right foot itching.  Bilateral lower extremity edema improved  Objective:  Vital signs in last 24 hours:  Temp:  [97.4 F (36.3 C)-98.9 F (37.2 C)] 98.2 F (36.8 C) (03/10 1124) Pulse Rate:  [89-96] 91 (03/10 1124) Resp:  [16-20] 18 (03/10 1124) BP: (130-169)/(52-78) 132/52 (03/10 1124) SpO2:  [95 %-98 %] 95 % (03/10 1124) Weight:  [151 kg] 151 kg (03/10 0454)  Weight change: 1.996 kg Filed Weights   07/25/21 0555 07/26/21 0423 07/27/21 0454  Weight: (!) 149.2 kg (!) 149 kg (!) 151 kg    Intake/Output: I/O last 3 completed shifts: In: 43 [P.O.:380] Out: 400 [Urine:400]   Intake/Output this shift:  Total I/O In: 360 [P.O.:360] Out: -   Physical Exam: General: NAD  Head: Normocephalic, atraumatic. Moist oral mucosal membranes, Cpap in situ  Eyes: Anicteric  Lungs:  Clear bilaterally, normal effort  Heart: Regular rate and rhythm  Abdomen:  Soft, nontender  Extremities:  1+ peripheral edema.  Neurologic: Nonfocal, moving all four extremities  Skin: No lesions, dry bilateral lower extremities       Basic Metabolic Panel: Recent Labs  Lab 07/23/21 0515 07/24/21 0025 07/25/21 0657 07/26/21 0556 07/27/21 0612  NA 132* 130* 131* 132* 129*  K 4.4 4.1 4.2 4.5 4.1  CL 78* 75* 76* 77* 80*  CO2 41* 39* 44* 41* 41*  GLUCOSE 119* 129* 117* 113* 125*  BUN 67* 64* 66* 64* 68*  CREATININE 2.21* 2.20* 2.45* 2.47* 2.61*  CALCIUM 9.3 9.2 9.3 9.6 9.2  MG  --   --  1.7  --   --   PHOS  --  3.6 4.3  --   --      Liver Function Tests: Recent Labs  Lab 07/24/21 0025  ALBUMIN 3.3*    No results for input(s): LIPASE, AMYLASE in the last 168 hours. No results for input(s): AMMONIA in the last 168 hours.  CBC: Recent Labs  Lab 07/21/21 0542 07/22/21 0451 07/25/21 0657  WBC 12.7*  --  9.9  HGB 9.1* 9.5* 9.6*   HCT 30.2*  --  32.0*  MCV 76.5*  --  75.7*  PLT 316  --  309     Cardiac Enzymes: No results for input(s): CKTOTAL, CKMB, CKMBINDEX, TROPONINI in the last 168 hours.  BNP: Invalid input(s): POCBNP  CBG: No results for input(s): GLUCAP in the last 168 hours.  Microbiology: Results for orders placed or performed during the hospital encounter of 07/15/21  Resp Panel by RT-PCR (Flu A&B, Covid) Nasopharyngeal Swab     Status: None   Collection Time: 07/15/21  4:36 AM   Specimen: Nasopharyngeal Swab; Nasopharyngeal(NP) swabs in vial transport medium  Result Value Ref Range Status   SARS Coronavirus 2 by RT PCR NEGATIVE NEGATIVE Final    Comment: (NOTE) SARS-CoV-2 target nucleic acids are NOT DETECTED.  The SARS-CoV-2 RNA is generally detectable in upper respiratory specimens during the acute phase of infection. The lowest concentration of SARS-CoV-2 viral copies this assay can detect is 138 copies/mL. A negative result does not preclude SARS-Cov-2 infection and should not be used as the sole basis for treatment or other patient management decisions. A negative result may occur with  improper specimen collection/handling, submission of specimen other than nasopharyngeal swab, presence of viral mutation(s) within the areas targeted by this assay, and inadequate  number of viral copies(<138 copies/mL). A negative result must be combined with clinical observations, patient history, and epidemiological information. The expected result is Negative.  Fact Sheet for Patients:  EntrepreneurPulse.com.au  Fact Sheet for Healthcare Providers:  IncredibleEmployment.be  This test is no t yet approved or cleared by the [place] FDA and  has been authorized for detection and/or diagnosis of SARS-CoV-2 by FDA under an Emergency Use Authorization (EUA). This EUA will remain  in effect (meaning this test can be used) for the duration of the COVID-19  declaration under Section 564(b)(1) of the Act, 21 U.S.C.section 360bbb-3(b)(1), unless the authorization is terminated  or revoked sooner.       Influenza A by PCR NEGATIVE NEGATIVE Final   Influenza B by PCR NEGATIVE NEGATIVE Final    Comment: (NOTE) The Xpert Xpress SARS-CoV-2/FLU/RSV plus assay is intended as an aid in the diagnosis of influenza from Nasopharyngeal swab specimens and should not be used as a sole basis for treatment. Nasal washings and aspirates are unacceptable for Xpert Xpress SARS-CoV-2/FLU/RSV testing.  Fact Sheet for Patients: EntrepreneurPulse.com.au  Fact Sheet for Healthcare Providers: IncredibleEmployment.be  This test is not yet approved or cleared by the [place] FDA and has been authorized for detection and/or diagnosis of SARS-CoV-2 by FDA under an Emergency Use Authorization (EUA). This EUA will remain in effect (meaning this test can be used) for the duration of the COVID-19 declaration under Section 564(b)(1) of the Act, 21 U.S.C. section 360bbb-3(b)(1), unless the authorization is terminated or revoked.  Performed at [facility], [place], [place] [address]   Respiratory (~20 pathogens) panel by PCR     Status: None   Collection Time: 07/15/21  6:00 PM   Specimen: Nasopharyngeal Swab; Respiratory  Result Value Ref Range Status   Adenovirus NOT DETECTED NOT DETECTED Final   Coronavirus 229E NOT DETECTED NOT DETECTED Final    Comment: (NOTE) The Coronavirus on the Respiratory Panel, DOES NOT test for the novel  Coronavirus (2019 nCoV)    Coronavirus HKU1 NOT DETECTED NOT DETECTED Final   Coronavirus NL63 NOT DETECTED NOT DETECTED Final   Coronavirus OC43 NOT DETECTED NOT DETECTED Final   Metapneumovirus NOT DETECTED NOT DETECTED Final   Rhinovirus / Enterovirus NOT DETECTED NOT DETECTED Final   Influenza A NOT DETECTED NOT DETECTED Final   Influenza B NOT DETECTED NOT  DETECTED Final   Parainfluenza Virus 1 NOT DETECTED NOT DETECTED Final   Parainfluenza Virus 2 NOT DETECTED NOT DETECTED Final   Parainfluenza Virus 3 NOT DETECTED NOT DETECTED Final   Parainfluenza Virus 4 NOT DETECTED NOT DETECTED Final   Respiratory Syncytial Virus NOT DETECTED NOT DETECTED Final   Bordetella pertussis NOT DETECTED NOT DETECTED Final   Bordetella Parapertussis NOT DETECTED NOT DETECTED Final   Chlamydophila pneumoniae NOT DETECTED NOT DETECTED Final   Mycoplasma pneumoniae NOT DETECTED NOT DETECTED Final    Comment: Performed at [facility], [place]. [address], [place], [place] [address]  Expectorated Sputum Assessment w Gram Stain, Rflx to Resp Cult     Status: None   Collection Time: 07/19/21 10:45 PM   Specimen: Sputum  Result Value Ref Range Status   Specimen Description SPUTUM  Final   Special Requests NONE  Final   Sputum evaluation   Final    Sputum specimen not acceptable for testing.  Please recollect.   RESULT CALLED TO, READ BACK BY AND VERIFIED WITH: ROSE NGU '@[id number]'$  ON 07/19/21 SKL Performed  at [facility], [address] [place], [place] [address]    Report Status 07/19/2021 FINAL  Final    Coagulation Studies: No results for input(s): LABPROT, INR in the last 72 hours.  Urinalysis: No results for input(s): COLORURINE, LABSPEC, PHURINE, GLUCOSEU, HGBUR, BILIRUBINUR, KETONESUR, PROTEINUR, UROBILINOGEN, NITRITE, LEUKOCYTESUR in the last 72 hours.  Invalid input(s): APPERANCEUR     Imaging: No results found.   Medications:       aspirin EC  81 mg Oral Daily   chlorhexidine  15 mL Mouth Rinse BID   cholestyramine  4 g Oral QHS   diltiazem  180 mg Oral Daily   enoxaparin (LOVENOX) injection  0.5 mg/kg Subcutaneous Q24H   ipratropium-albuterol  3 mL Nebulization BID   loratadine  10 mg Oral Daily   mouth rinse  15 mL Mouth Rinse q12n4p   melatonin  5 mg Oral QHS   metoCLOPramide (REGLAN) injection  5 mg Intravenous TID AC    nystatin   Topical TID   pantoprazole  40 mg Oral BID   rOPINIRole  1 mg Oral TID   torsemide  40 mg Oral Daily   acetaminophen **OR** acetaminophen, albuterol, dextromethorphan-guaiFENesin, hydrALAZINE, loperamide, ondansetron (ZOFRAN) IV  Assessment/ Plan:  Ms. Samantha Mason is a 71 y.o.  female with COPD/asthma, congestive heart failure, obstructive sleep apnea, IBS, hypertension, who was admitted to [facility] on 07/15/2021 for COPD exacerbation ([place]) [J44.1] Acute on chronic diastolic congestive heart failure (HCC) [I50.33] Hypertensive emergency [I16.1] Acute on chronic respiratory failure with hypoxia (HCC) [J96.21] Acute respiratory failure with hypoxia and hypercapnia (HCC) [J96.01, J96.02]   Acute kidney injury on chronic kidney disease stage IIIB: baseline creatinine of 1.36, GFR of 42 on 03/10/2021. History of bland urine. Chronic kidney disease secondary to hypertension. Acute kidney injury secondary to acute cardiorenal syndrome. No IV contrast exposure. Renal ultrasound with no obstruction.  Creatinine slightly increased today, no urine output recorded. Patient states she continues to urinate, not as frequently. May need low dose Metolazone 2.'5mg'$  twice weekly at discharge. Will require outpatient follow up at discharge.   Lab Results  Component Value Date   CREATININE 2.61 (H) 07/27/2021   CREATININE 2.47 (H) 07/26/2021   CREATININE 2.45 (H) 07/25/2021    Intake/Output Summary (Last 24 hours) at 07/27/2021 1345 Last data filed at 07/27/2021 0900 Gross per 24 hour  Intake 360 ml  Output 100 ml  Net 260 ml    2.  Acute on chronic diastolic congestive heart failure with LE edema -  echocardiogram riewed. Patient has failed outpatient therapy with PO furosemide.  -Continue Torsemide ordered as above  Hypertension:Home regimen includes olmesartan, furosemide PRN, and diltiazem.  - Torsemide '40mg'$  daily - Continue diltiazem - holding olmesartan as above.    Acute  exacerbation of COPD: Continue supportive care and supplemental oxygen.  5. Anemia of chronic disease and iron deficiency anemia  IV iron received during this admission.  Hemoglobin currently 9.6  6. Acute Metabolic alkalosis-  likely due to hypercapnic resp failure and prolonged use of IV Furosemide.     - Torsemide '40mg'$  daily.   - CPAP therapy hs    LOS: 12   3/10/20231:45 PM

## 2021-07-27 NOTE — Care Management Important Message (Signed)
Important Message ? ?Patient Details  ?Name: Samantha Mason ?MRN: [id number] ?Date of Birth: [birth year] ? ? ?Medicare Important Message Given:  Yes ? ? ? ? ?[name] ?07/27/2021, 12:45 PM ?

## 2021-07-27 NOTE — Discharge Summary (Signed)
Physician Discharge Summary   Patient: Samantha Mason MRN: [id number] DOB: 1950-08-26  Admit date:     07/15/2021  Discharge date: 07/27/21  Discharge Physician: Annita Brod   PCP: Glean Hess, MD   Recommendations at discharge:   Patient will follow-up with nephrology in the next 2 weeks Patient follow-up with cardiology in the next 2 weeks Patient will follow-up with her PCP in the next 1 month Medication change: Lasix and Benicar discontinued New medication: Demadex 40 mg p.o. daily Patient going home with home health PT and OT  Discharge Diagnoses: Principal Problem:   Acute on chronic respiratory failure with hypoxia and hypercapnia ([place]) Active Problems:   Acute on chronic diastolic CHF (congestive heart failure) (HCC)   Nausea   COPD exacerbation ([place])   Acute kidney injury superimposed on chronic kidney disease ([place])   Essential hypertension   Microcytic anemia   History of stroke   Morbid obesity ([place])   OSA on CPAP   Stage 3a chronic kidney disease (CKD) ([place])   Myocardial injury   Insomnia   Hyponatremia  Resolved Problems:   Asthma with COPD [facility]   Hypertensive emergency  Hospital Course: Samantha Mason is a 71 y.o. F with diastolic CHF, and COPD with chronic respiratory failure on 2 L nasal cannula as well as obstructive sleep apnea on CPAP, stage IIIb chronic kidney disease, hypertension and morbid obesity who presented to the emergency room on 2/26 for leg swelling and shortness of breath and found to be acute diastolic heart failure.  The patient's baseline weight is 322 pounds and she came in at 359 pounds.    Patient has been undergoing IV diuresis with Lasix and by day of discharge down to 328 pounds.  At this point, patient felt to be stable and was unclear if this was her new norm in terms of dry weight but felt the rest of her diuresis could be managed at home.  Assessment and Plan: * Acute on chronic respiratory failure with hypoxia  and hypercapnia ([place]) Transitional care working on setting up NIV for home.  Patient on baseline 2 L of oxygen.  Initially required BiPAP for respiratory distress.  Unable to tolerate the BiPAP and was switched over to CPAP because we do not have an IV here.  Set up with Trilogy through adapt.  Nausea Improving.  Right upper quadrant ultrasound negative.  As needed Reglan during hospitalization  Acute on chronic diastolic CHF (congestive heart failure) ([place]) Patient on Lasix 60 mg IV twice a day.  Patient's baseline weight around 322.  Weight this morning of 328.  Discharged on Demadex.  She will follow-up with cardiology as outpatient   COPD exacerbation ([place]) Likely CHF and not COPD.  Continue nebulizers as needed  Acute kidney injury superimposed on chronic kidney disease ([place]) By day of discharge, creatinine 2.61.  Patient has been followed by nephrology during hospitalization.  They will follow-up with her as an outpatient.  Cozaar discontinued because of worsening renal function.   Essential hypertension Discharged on Cardizem and Demadex  Microcytic anemia Hemoglobin remaining stable, today at 9.6.  Received IV iron during the hospital course.  History of stroke LDL 68, Continue low-dose aspirin   Morbid obesity (HCC) Meets criteria with BMI greater than 40  Hyponatremia Hypervolemic hyponatremia in the context of heart failure.  Insomnia Trial of melatonin  Stage 3a chronic kidney disease (CKD) ([place]) Patient has stage IIIa chronic kidney disease.  Renal function has improved to  this point.  OSA on CPAP Patient is at home CPAP not working.  TOC set up noninvasive ventilation at night.  Currently not tolerating the BiPAP very well and went back to CPAP.  Set up with trilogy as outpatient  Hypertensive emergency-resolved as of 07/18/2021 Secondary to heart failure.  Improved with aggressive diuresis over the past week  Asthma with COPD (HCC)-resolved as of  07/22/2021           Consultants: Nephrology, cardiology Procedures performed: Echocardiogram done 2/27: Preserved ejection fraction with indeterminate diastolic function Disposition: Home Diet recommendation:  Discharge Diet Orders (From admission, onward)     Start     Ordered   07/27/21 0000  Diet - low sodium heart healthy        07/27/21 1421           Cardiac diet DISCHARGE MEDICATION: Allergies as of 07/27/2021       Reactions   Nuvigil [armodafinil] Hives   Penicillins Diarrhea, Nausea And Vomiting   Did it involve swelling of the face/tongue/throat, SOB, or low BP? no Did it involve sudden or severe rash/hives, skin peeling, or any reaction on the inside of your mouth or nose? No Did you need to seek medical attention at a hospital or doctor's office? No When did it last happen?  in her 10s    If all above answers are NO, may proceed with cephalosporin use.   Gabapentin Other (See Comments)   "wiped her out, couldn't stay awake"   Provigil [modafinil] Hives        Medication List     STOP taking these medications    furosemide 20 MG tablet Commonly known as: LASIX   olmesartan 5 MG tablet Commonly known as: BENICAR   promethazine 25 MG tablet Commonly known as: PHENERGAN       TAKE these medications    acetaminophen 500 MG tablet Commonly known as: TYLENOL Take 1,000 mg by mouth every 6 (six) hours as needed.   aspirin 81 MG EC tablet Take 1 tablet (81 mg total) by mouth daily. Swallow whole. Start taking on: July 28, 2021   budesonide 0.5 MG/2ML nebulizer solution Commonly known as: PULMICORT Take 2 mLs (0.5 mg total) by nebulization 2 (two) times daily.   cetirizine 10 MG tablet Commonly known as: ZYRTEC Take 1 tablet (10 mg total) by mouth daily.   cholestyramine 4 g packet Commonly known as: QUESTRAN DISSOLVE AND DRINK 1 PACKET BY MOUTH AT BEDTIME   diltiazem 180 MG 24 hr capsule Commonly known as: CARDIZEM CD Take 1  capsule (180 mg total) by mouth daily.   ipratropium-albuterol 0.5-2.5 (3) MG/3ML Soln Commonly known as: DUONEB Take 3 mLs by nebulization every 6 (six) hours as needed.   levalbuterol 1.25 MG/0.5ML nebulizer solution Commonly known as: XOPENEX Take 1.25 mg by nebulization every 6 (six) hours as needed for wheezing or shortness of breath.   nystatin powder Commonly known as: MYCOSTATIN/NYSTOP Apply topically 3 (three) times daily.   pantoprazole 40 MG tablet Commonly known as: PROTONIX TAKE 1 TABLET BY MOUTH DAILY   rOPINIRole 1 MG tablet Commonly known as: REQUIP Take 1 tablet (1 mg total) by mouth 3 (three) times daily.   sodium chloride 0.9 % nebulizer solution as directed.   Torsemide 40 MG Tabs Take 40 mg by mouth daily. Start taking on: July 28, 2021        Follow-up Information     Glean Hess, MD Follow up in  1 month(s).   Specialty: Internal Medicine Contact information: [address] [place] [address] [phone]         Nelva Bush, MD .   Specialty: Cardiology Contact information: [place] [address] [phone]         Murlean Iba, MD. Schedule an appointment as soon as possible for a visit in 2 week(s).   Specialty: Nephrology Contact information: [place] [address] [phone]                Discharge Exam: Danley Danker Weights   07/25/21 0555 07/26/21 0423 07/27/21 0454  Weight: (!) 149.2 kg (!) 149 kg (!) 151 kg   General: Alert and oriented x3, no acute distress Cardiovascular: Regular rate and rhythm, S1-S2 Lungs: Clear to auscultation bilaterally  Condition at discharge: good  The results of significant diagnostics from this hospitalization (including imaging, microbiology, ancillary and laboratory) are listed below for reference.   Imaging Studies: US RENAL  Result Date: 07/17/2021 CLINICAL DATA:  Acute kidney injury  EXAM: RENAL / URINARY TRACT ULTRASOUND COMPLETE COMPARISON:  CT abdomen pelvis 08/30/2010 FINDINGS: Right Kidney: Renal measurements: 10.3 x 4.4 x 3.8 cm = volume: 89 mL. Echogenicity within normal limits. No mass or hydronephrosis visualized. Mild perinephric effusion. Left Kidney: Renal measurements: 10.6 x 4.4 x 4.6 cm = volume: 122 mL. Echogenicity within normal limits. No mass or hydronephrosis visualized. Mild perinephric effusion Bladder: Appears normal for degree of bladder distention. Other: Image quality degraded by patient body habitus IMPRESSION: Relatively small kidneys. No hydronephrosis. Bilateral perinephric effusions. Electronically Signed   By: Franchot Gallo M.D.   On: 07/17/2021 11:35   DG Chest Port 1 View  Result Date: 07/17/2021 CLINICAL DATA:  Chest pain and COPD EXAM: PORTABLE CHEST 1 VIEW COMPARISON:  07/17/2021 FINDINGS: Cardiac enlargement with mild vascular congestion. Negative for edema or effusion. Elevated right hemidiaphragm with bibasilar atelectasis unchanged. IMPRESSION: Mild vascular congestion unchanged. Mild bibasilar atelectasis unchanged from earlier today. Electronically Signed   By: Franchot Gallo M.D.   On: 07/17/2021 17:57   DG Chest Port 1 View  Result Date: 07/17/2021 CLINICAL DATA:  Chronic respiratory failure with hypoxia. EXAM: PORTABLE CHEST 1 VIEW COMPARISON:  July 15, 2021. FINDINGS: Stable cardiomegaly with central pulmonary vascular congestion. No pneumothorax or pleural effusion is noted. Bony thorax is unremarkable. IMPRESSION: Stable cardiomegaly with central pulmonary vascular congestion. Electronically Signed   By: Marijo Conception M.D.   On: 07/17/2021 08:49   DG Chest Portable 1 View  Result Date: 07/15/2021 CLINICAL DATA:  Shortness of breath EXAM: PORTABLE CHEST 1 VIEW COMPARISON:  03/08/2021 FINDINGS: Cardiomegaly with diffuse interstitial prominence and cephalized blood flow. Vascular pedicle widening. Mild atelectasis on the right.  Elevated right diaphragm, chronic. No visible effusion or pneumothorax. IMPRESSION: CHF pattern. Electronically Signed   By: Jorje Guild M.D.   On: 07/15/2021 04:52   ECHOCARDIOGRAM COMPLETE  Result Date: 07/16/2021    ECHOCARDIOGRAM REPORT   Patient Name:   FARZANA KOCI Date of Exam: 07/16/2021 Medical Rec #:  [id number]        Height:       64.0 in Accession #:    [id number]       Weight:       355.8 lb Date of Birth:  June 12, 1950       BSA:          2.499 m Patient Age:    60  years         BP:           165/75 mmHg Patient Gender: F                HR:           106 bpm. Exam Location:  [facility] Procedure: 2D Echo, Color Doppler, Cardiac Doppler and Intracardiac            Opacification Agent Indications:     I50.31 congestive heart failure-Acute Diastolic  History:         Patient has prior history of Echocardiogram examinations, most                  recent 08/31/2020. Stroke; Risk Factors:Sleep Apnea. Hx of                  asthma.  Sonographer:     Charmayne Sheer Referring Phys:  [place] Diagnosing Phys: Kathlyn Sacramento MD  Sonographer Comments: Technically difficult study due to poor echo windows. Image acquisition challenging due to patient body habitus. IMPRESSIONS  1. Left ventricular ejection fraction, by estimation, is 55 to 60%. The left ventricle has normal function. Left ventricular endocardial border not optimally defined to evaluate regional wall motion. There is mild left ventricular hypertrophy. Left ventricular diastolic parameters are indeterminate.  2. Right ventricular systolic function is normal. The right ventricular size is normal. Tricuspid regurgitation signal is inadequate for assessing PA pressure.  3. The mitral valve is normal in structure. No evidence of mitral valve regurgitation. No evidence of mitral stenosis.  4. The aortic valve is normal in structure. Aortic valve regurgitation is not visualized. No aortic stenosis is present.  5. The inferior vena cava is normal in size  with greater than 50% respiratory variability, suggesting right atrial pressure of 3 mmHg.  6. Challenging image quality. FINDINGS  Left Ventricle: Left ventricular ejection fraction, by estimation, is 55 to 60%. The left ventricle has normal function. Left ventricular endocardial border not optimally defined to evaluate regional wall motion. Definity contrast agent was given IV to delineate the left ventricular endocardial borders. The left ventricular internal cavity size was normal in size. There is mild left ventricular hypertrophy. Left ventricular diastolic parameters are indeterminate. Right Ventricle: The right ventricular size is normal. No increase in right ventricular wall thickness. Right ventricular systolic function is normal. Tricuspid regurgitation signal is inadequate for assessing PA pressure. Left Atrium: Left atrial size was normal in size. Right Atrium: Right atrial size was normal in size. Pericardium: There is no evidence of pericardial effusion. Mitral Valve: The mitral valve is normal in structure. No evidence of mitral valve regurgitation. No evidence of mitral valve stenosis. MV peak gradient, 13.2 mmHg. The mean mitral valve gradient is 7.0 mmHg. Tricuspid Valve: The tricuspid valve is normal in structure. Tricuspid valve regurgitation is not demonstrated. No evidence of tricuspid stenosis. Aortic Valve: The aortic valve is normal in structure. Aortic valve regurgitation is not visualized. No aortic stenosis is present. Aortic valve mean gradient measures 6.0 mmHg. Aortic valve peak gradient measures 12.8 mmHg. Aortic valve area, by VTI measures 3.52 cm. Pulmonic Valve: The pulmonic valve was normal in structure. Pulmonic valve regurgitation is not visualized. No evidence of pulmonic stenosis. Aorta: The aortic root is normal in size and structure. Venous: The inferior vena cava is normal in size with greater than 50% respiratory variability, suggesting right atrial pressure of 3 mmHg.  IAS/Shunts: No atrial level shunt  detected by color flow Doppler.  LEFT VENTRICLE PLAX 2D LVIDd:         4.45 cm   Diastology LVIDs:         3.23 cm   LV e' medial:    6.42 cm/s LV PW:         1.45 cm   LV E/e' medial:  20.1 LV IVS:        1.09 cm   LV e' lateral:   10.30 cm/s LVOT diam:     2.40 cm   LV E/e' lateral: 12.5 LV SV:         97 LV SV Index:   39 LVOT Area:     4.52 cm  RIGHT VENTRICLE RV Basal diam:  4.16 cm LEFT ATRIUM           Index        RIGHT ATRIUM           Index LA diam:      3.90 cm 1.56 cm/m   RA Area:     18.50 cm LA Vol (A4C): 45.1 ml 18.05 ml/m  RA Volume:   57.70 ml  23.09 ml/m  AORTIC VALVE                     PULMONIC VALVE AV Area (Vmax):    2.86 cm      PV Vmax:       1.49 m/s AV Area (Vmean):   2.91 cm      PV Vmean:      99.500 cm/s AV Area (VTI):     3.52 cm      PV VTI:        0.302 m AV Vmax:           179.00 cm/s   PV Peak grad:  8.9 mmHg AV Vmean:          116.000 cm/s  PV Mean grad:  5.0 mmHg AV VTI:            0.275 m AV Peak Grad:      12.8 mmHg AV Mean Grad:      6.0 mmHg LVOT Vmax:         113.00 cm/s LVOT Vmean:        74.700 cm/s LVOT VTI:          0.214 m LVOT/AV VTI ratio: 0.78  AORTA Ao Root diam: 3.50 cm MITRAL VALVE MV Area (PHT): 12.86 cm    SHUNTS MV Area VTI:   3.35 cm     Systemic VTI:  0.21 m MV Peak grad:  13.2 mmHg    Systemic Diam: 2.40 cm MV Mean grad:  7.0 mmHg MV Vmax:       1.82 m/s MV Vmean:      129.0 cm/s MV Decel Time: 59 msec MV E velocity: 129.00 cm/s MV A velocity: 171.00 cm/s MV E/A ratio:  0.75 Kathlyn Sacramento MD Electronically signed by Kathlyn Sacramento MD Signature Date/Time: 07/16/2021/3:27:04 PM    Final    US Abdomen Limited RUQ (LIVER/GB)  Result Date: 07/24/2021 CLINICAL DATA:  Nausea EXAM: ULTRASOUND ABDOMEN LIMITED RIGHT UPPER QUADRANT COMPARISON:  Abdominal CT 08/30/2010 FINDINGS: Gallbladder: History of cholecystectomy. Common bile duct: Diameter: 6 mm. Very limited visualization of the ducts due to body habitus. Liver:  Diffusely echogenic with diminished acoustic penetration. No evidence of mass lesion. Portal vein is patent on color Doppler imaging with normal direction of blood flow towards  the liver. IMPRESSION: 1. No acute finding.  Remote cholecystectomy. 2. Hepatic steatosis. Electronically Signed   By: Jorje Guild M.D.   On: 07/24/2021 10:32    Microbiology: Results for orders placed or performed during the hospital encounter of 07/15/21  Resp Panel by RT-PCR (Flu A&B, Covid) Nasopharyngeal Swab     Status: None   Collection Time: 07/15/21  4:36 AM   Specimen: Nasopharyngeal Swab; Nasopharyngeal(NP) swabs in vial transport medium  Result Value Ref Range Status   SARS Coronavirus 2 by RT PCR NEGATIVE NEGATIVE Final    Comment: (NOTE) SARS-CoV-2 target nucleic acids are NOT DETECTED.  The SARS-CoV-2 RNA is generally detectable in upper respiratory specimens during the acute phase of infection. The lowest concentration of SARS-CoV-2 viral copies this assay can detect is 138 copies/mL. A negative result does not preclude SARS-Cov-2 infection and should not be used as the sole basis for treatment or other patient management decisions. A negative result may occur with  improper specimen collection/handling, submission of specimen other than nasopharyngeal swab, presence of viral mutation(s) within the areas targeted by this assay, and inadequate number of viral copies(<138 copies/mL). A negative result must be combined with clinical observations, patient history, and epidemiological information. The expected result is Negative.  Fact Sheet for Patients:  EntrepreneurPulse.com.au  Fact Sheet for Healthcare Providers:  IncredibleEmployment.be  This test is no t yet approved or cleared by the [place] FDA and  has been authorized for detection and/or diagnosis of SARS-CoV-2 by FDA under an Emergency Use Authorization (EUA). This EUA will remain  in effect  (meaning this test can be used) for the duration of the COVID-19 declaration under Section 564(b)(1) of the Act, 21 U.S.C.section 360bbb-3(b)(1), unless the authorization is terminated  or revoked sooner.       Influenza A by PCR NEGATIVE NEGATIVE Final   Influenza B by PCR NEGATIVE NEGATIVE Final    Comment: (NOTE) The Xpert Xpress SARS-CoV-2/FLU/RSV plus assay is intended as an aid in the diagnosis of influenza from Nasopharyngeal swab specimens and should not be used as a sole basis for treatment. Nasal washings and aspirates are unacceptable for Xpert Xpress SARS-CoV-2/FLU/RSV testing.  Fact Sheet for Patients: EntrepreneurPulse.com.au  Fact Sheet for Healthcare Providers: IncredibleEmployment.be  This test is not yet approved or cleared by the [place] FDA and has been authorized for detection and/or diagnosis of SARS-CoV-2 by FDA under an Emergency Use Authorization (EUA). This EUA will remain in effect (meaning this test can be used) for the duration of the COVID-19 declaration under Section 564(b)(1) of the Act, 21 U.S.C. section 360bbb-3(b)(1), unless the authorization is terminated or revoked.  Performed at [facility], [place], [place] [address]   Respiratory (~20 pathogens) panel by PCR     Status: None   Collection Time: 07/15/21  6:00 PM   Specimen: Nasopharyngeal Swab; Respiratory  Result Value Ref Range Status   Adenovirus NOT DETECTED NOT DETECTED Final   Coronavirus 229E NOT DETECTED NOT DETECTED Final    Comment: (NOTE) The Coronavirus on the Respiratory Panel, DOES NOT test for the novel  Coronavirus (2019 nCoV)    Coronavirus HKU1 NOT DETECTED NOT DETECTED Final   Coronavirus NL63 NOT DETECTED NOT DETECTED Final   Coronavirus OC43 NOT DETECTED NOT DETECTED Final   Metapneumovirus NOT DETECTED NOT DETECTED Final   Rhinovirus / Enterovirus NOT DETECTED NOT DETECTED Final   Influenza A  NOT DETECTED NOT DETECTED Final   Influenza B NOT  DETECTED NOT DETECTED Final   Parainfluenza Virus 1 NOT DETECTED NOT DETECTED Final   Parainfluenza Virus 2 NOT DETECTED NOT DETECTED Final   Parainfluenza Virus 3 NOT DETECTED NOT DETECTED Final   Parainfluenza Virus 4 NOT DETECTED NOT DETECTED Final   Respiratory Syncytial Virus NOT DETECTED NOT DETECTED Final   Bordetella pertussis NOT DETECTED NOT DETECTED Final   Bordetella Parapertussis NOT DETECTED NOT DETECTED Final   Chlamydophila pneumoniae NOT DETECTED NOT DETECTED Final   Mycoplasma pneumoniae NOT DETECTED NOT DETECTED Final    Comment: Performed at [facility], [place] [address], [place], [place] [address]  Expectorated Sputum Assessment w Gram Stain, Rflx to Resp Cult     Status: None   Collection Time: 07/19/21 10:45 PM   Specimen: Sputum  Result Value Ref Range Status   Specimen Description SPUTUM  Final   Special Requests NONE  Final   Sputum evaluation   Final    Sputum specimen not acceptable for testing.  Please recollect.   RESULT CALLED TO, READ BACK BY AND VERIFIED WITH: ROSE NGU '@[id number]'$  ON 07/19/21 SKL Performed at [facility], [address] [place], [place] [address]    Report Status 07/19/2021 FINAL  Final    Labs: CBC: Recent Labs  Lab 07/21/21 0542 07/22/21 0451 07/25/21 0657  WBC 12.7*  --  9.9  HGB 9.1* 9.5* 9.6*  HCT 30.2*  --  32.0*  MCV 76.5*  --  75.7*  PLT 316  --  852   Basic Metabolic Panel: Recent Labs  Lab 07/23/21 0515 07/24/21 0025 07/25/21 0657 07/26/21 0556 07/27/21 0612  NA 132* 130* 131* 132* 129*  K 4.4 4.1 4.2 4.5 4.1  CL 78* 75* 76* 77* 80*  CO2 41* 39* 44* 41* 41*  GLUCOSE 119* 129* 117* 113* 125*  BUN 67* 64* 66* 64* 68*  CREATININE 2.21* 2.20* 2.45* 2.47* 2.61*  CALCIUM 9.3 9.2 9.3 9.6 9.2  MG  --   --  1.7  --   --   PHOS  --  3.6 4.3  --   --    Liver Function Tests: Recent Labs  Lab 07/24/21 0025  ALBUMIN 3.3*   CBG: No results for  input(s): GLUCAP in the last 168 hours.  Discharge time spent: less than 30 minutes.  Signed: Annita Brod, MD [facility] 07/27/2021

## 2021-07-27 NOTE — Plan of Care (Signed)
  Problem: Education: Goal: Ability to demonstrate management of disease process will improve Outcome: Progressing Goal: Ability to verbalize understanding of medication therapies will improve Outcome: Progressing Goal: Individualized Educational Video(s) Outcome: Progressing   Problem: Activity: Goal: Capacity to carry out activities will improve Outcome: Progressing   Problem: Cardiac: Goal: Ability to achieve and maintain adequate cardiopulmonary perfusion will improve Outcome: Progressing   Problem: Education: Goal: Knowledge of disease or condition will improve Outcome: Progressing Goal: Knowledge of the prescribed therapeutic regimen will improve Outcome: Progressing Goal: Individualized Educational Video(s) Outcome: Progressing   Problem: Activity: Goal: Ability to tolerate increased activity will improve Outcome: Progressing Goal: Will verbalize the importance of balancing activity with adequate rest periods Outcome: Progressing   Problem: Respiratory: Goal: Ability to maintain a clear airway will improve Outcome: Progressing Goal: Levels of oxygenation will improve Outcome: Progressing Goal: Ability to maintain adequate ventilation will improve Outcome: Progressing   Problem: Education: Goal: Knowledge of General Education information will improve Description Including pain rating scale, medication(s)/side effects and non-pharmacologic comfort measures Outcome: Progressing   Problem: Health Behavior/Discharge Planning: Goal: Ability to manage health-related needs will improve Outcome: Progressing   Problem: Clinical Measurements: Goal: Ability to maintain clinical measurements within normal limits will improve Outcome: Progressing Goal: Will remain free from infection Outcome: Progressing Goal: Diagnostic test results will improve Outcome: Progressing Goal: Respiratory complications will improve Outcome: Progressing Goal: Cardiovascular complication  will be avoided Outcome: Progressing   Problem: Activity: Goal: Risk for activity intolerance will decrease Outcome: Progressing   Problem: Nutrition: Goal: Adequate nutrition will be maintained Outcome: Progressing   Problem: Coping: Goal: Level of anxiety will decrease Outcome: Progressing   Problem: Elimination: Goal: Will not experience complications related to bowel motility Outcome: Progressing Goal: Will not experience complications related to urinary retention Outcome: Progressing   Problem: Pain Managment: Goal: General experience of comfort will improve Outcome: Progressing   Problem: Safety: Goal: Ability to remain free from injury will improve Outcome: Progressing   Problem: Skin Integrity: Goal: Risk for impaired skin integrity will decrease Outcome: Progressing   

## 2021-07-27 NOTE — Progress Notes (Signed)
Discharge instruction explained to pt / verbalized an understanding / iv and tele removed/ portable o2 tank to be delivered to room / will transport off unit via wheelchair  ?

## 2021-07-30 ENCOUNTER — Telehealth: Payer: Self-pay

## 2021-07-30 ENCOUNTER — Other Ambulatory Visit (HOSPITAL_COMMUNITY): Payer: Self-pay

## 2021-07-30 NOTE — Telephone Encounter (Signed)
Copied from [place] [id number]. Topic: General - Other ?>> Jul 30, 2021  4:12 PM Tessa Lerner A wrote: ?Reason for CRM: Tiffany with [facility] has called to share that they've been unable to get in contact with the patient to schedule their evaluation  ?[facility] would like permission to extend patient's date of evaluation to 07/31/21 ?Please contact further when possible ?

## 2021-07-30 NOTE — Telephone Encounter (Signed)
Transition Care Management Follow-up Telephone Call ?Date of discharge and from where: 07/27/21 [facility] ?How have you been since you were released from the hospital? Pt c/o still having some SOB, fatigue and mild swelling ?Any questions or concerns? No ? ?Items Reviewed: ?Did the pt receive and understand the discharge instructions provided? Yes  ?Medications obtained and verified? Yes  ?Other? No  ?Any new allergies since your discharge? No  ?Dietary orders reviewed? Yes ?Do you have support at home? Yes  ? ?Home Care and Equipment/Supplies: ?Were home health services ordered? yes ?If so, what is the name of the agency? [facility]  ?Has the agency set up a time to come to the patient's home? yes ?Were any new equipment or medical supplies ordered?  Yes: trilogy machine ?What is the name of the medical supply agency? [employer] ?Were you able to get the supplies/equipment? yes ?Do you have any questions related to the use of the equipment or supplies? No ? ?Functional Questionnaire: (I = Independent and D = Dependent) ?ADLs: I with assistance ? ?Bathing/Dressing- I with assistance ? ?Meal Prep- D ? ?Eating- I ? ?Maintaining continence- I ? ?Transferring/Ambulation- I ? ?Managing Meds- I ? ?Follow up appointments reviewed: ? ?PCP Hospital f/u appt confirmed? Yes  Scheduled to see Dr. Aretha Parrot on 08/27/21 @ 2:00. ?[facility] f/u appt confirmed? Yes  Scheduled to see Dr. Volanda Napoleon on 08/01/21 [place] [facility] or 08/06/21. ?Are transportation arrangements needed? No  ?If their condition worsens, is the pt aware to call PCP or go to the Emergency Dept.? Yes ?Was the patient provided with contact information for the PCP's office or ED? Yes ?Was to pt encouraged to call back with questions or concerns? Yes ? ?

## 2021-07-31 NOTE — Telephone Encounter (Signed)
Spoke to Tiffany gave her verbal approval for extended evaluation for 07/31/21. ? ?KP ?

## 2021-08-01 ENCOUNTER — Telehealth: Payer: Self-pay | Admitting: Pulmonary Disease

## 2021-08-01 ENCOUNTER — Telehealth: Payer: Self-pay | Admitting: Internal Medicine

## 2021-08-01 ENCOUNTER — Ambulatory Visit: Payer: Medicare Other | Admitting: Primary Care

## 2021-08-01 NOTE — Telephone Encounter (Signed)
Noted.  Will close encounter.  

## 2021-08-01 NOTE — Telephone Encounter (Signed)
Pt c/o medication issue: ? ?1. Name of Medication: Lasix ? ?2. How are you currently taking this medication (dosage and times per day)? As needed  ? ?3. Are you having a reaction (difficulty breathing--STAT)? Weight gain 4lbs over night fluid ? ?4. What is your medication issue? Currently not taking waiting on refill on torsemide should be ready this afternoon. Asking if she should take Lasix now until medication is ready for pick up  ?

## 2021-08-01 NOTE — Telephone Encounter (Signed)
Patient has not been seen since November in our office with significant interval events including recent hospitalization.  I recommend the patient begin torsemide that was prescribed at discharge and reach out to her PCP and nephrologist for further guidance if she is unable to make appointments offered by our office. ? ?Nelva Bush, MD ?[place] ?

## 2021-08-01 NOTE — Telephone Encounter (Signed)
Hx of CHF. Recent admission 07/15/2021. ? ?Spoke to patient. She stated that she has a 4lb weight gain since last night, nausea and increased SOB. ?Recommended that she contact cardiology for recommendations.  ?She voiced her understanding and had no further questions. ? ?Routing to Dr. Patsey Berthold as an Juluis Rainier.  ?

## 2021-08-01 NOTE — Telephone Encounter (Signed)
Spoke with the pt. Pt was no show for f/u 06/28/21 with Angelica Ran, NP in our office, pt states she was not aware of appt.  ?Pt admitted to [facility] 2/26-3/10 after presenting to ED for leg swelling and shortness of breath and found to be acute diastolic heart failure. Also with respiratory failure with hypoxia and hypercapnia. Per discharge, pt to follow up with cardiology 2 weeks which has not been scheduled as of yet.  ? ?Today pt c/o 4 lb weight gain overnight, bilateral lower leg swelling (below knees) per pt description with pitting edema. Denies incr SOB at time of call. Pt had breathing treatment this AM and reports recent SOB had improved with tx.  ? ?Lasix was discontinued at hospital discharge and changed to Torsemide 40 mg daily.  ?Pt states she has been unable to pick up Torsemide until today from pharmacy.  ?Pt's last dose of Lasix was last Friday 07/27/21. Pt 5 days without diuretic.  ? ?Pt states she will pick up Torsemide Rx today.  ?Advised pt start Torsemide today as prescribed. Continue to weight daily, let us know if wt continues to incr or any other changes in s/s.  ? ?Offered appointments next week, but pt refuses d/t appts offered were only in AM. Pt states significant hardship to leave house for any morning appointment, even around 11 AM which is what was offered.  ?Scheduled first available afternoon with Angelica Ran, NP 09/05/21 at 2:20 PM.  ? ?Will forward to Dr. Saunders Revel for any further recc in the meantime.  ? ? ? ? ? ?

## 2021-08-01 NOTE — Telephone Encounter (Signed)
Agree with recommendations.  Also note patient's primary pulmonologist is Dr. Mortimer Fries I have seen her just for work-in appointments. ?

## 2021-08-02 ENCOUNTER — Ambulatory Visit (INDEPENDENT_AMBULATORY_CARE_PROVIDER_SITE_OTHER): Payer: Medicare Other | Admitting: Adult Health

## 2021-08-02 ENCOUNTER — Telehealth: Payer: Self-pay | Admitting: Internal Medicine

## 2021-08-02 ENCOUNTER — Other Ambulatory Visit
Admission: RE | Admit: 2021-08-02 | Discharge: 2021-08-02 | Disposition: A | Discharge status: Home / Self Care | Payer: Medicare Other | Attending: Adult Health | Admitting: Adult Health

## 2021-08-02 ENCOUNTER — Other Ambulatory Visit: Payer: Self-pay

## 2021-08-02 ENCOUNTER — Encounter: Payer: Self-pay | Admitting: Adult Health

## 2021-08-02 VITALS — BP 130/60 | HR 82 | Temp 97.1°F | Ht 64.0 in | Wt 339.7 lb

## 2021-08-02 DIAGNOSIS — J4550 Severe persistent asthma, uncomplicated: Secondary | ICD-10-CM | POA: Diagnosis not present

## 2021-08-02 DIAGNOSIS — G2581 Restless legs syndrome: Secondary | ICD-10-CM

## 2021-08-02 DIAGNOSIS — Z6841 Body Mass Index (BMI) 40.0 and over, adult: Secondary | ICD-10-CM

## 2021-08-02 DIAGNOSIS — G4733 Obstructive sleep apnea (adult) (pediatric): Secondary | ICD-10-CM | POA: Diagnosis not present

## 2021-08-02 DIAGNOSIS — J9601 Acute respiratory failure with hypoxia: Secondary | ICD-10-CM

## 2021-08-02 DIAGNOSIS — N1831 Chronic kidney disease, stage 3a: Secondary | ICD-10-CM | POA: Diagnosis present

## 2021-08-02 DIAGNOSIS — Z9989 Dependence on other enabling machines and devices: Secondary | ICD-10-CM

## 2021-08-02 DIAGNOSIS — J9602 Acute respiratory failure with hypercapnia: Secondary | ICD-10-CM

## 2021-08-02 LAB — BASIC METABOLIC PANEL
Anion gap: 10 (ref 5–15)
BUN: 34 mg/dL — ABNORMAL HIGH (ref 8–23)
CO2: 31 mmol/L (ref 22–32)
Calcium: 9.6 mg/dL (ref 8.9–10.3)
Chloride: 85 mmol/L — ABNORMAL LOW (ref 98–111)
Creatinine, Ser: 1.59 mg/dL — ABNORMAL HIGH (ref 0.44–1.00)
GFR, Estimated: 35 mL/min — ABNORMAL LOW (ref >60)
Glucose, Bld: 116 mg/dL — ABNORMAL HIGH (ref 70–99)
Potassium: 4.6 mmol/L (ref 3.5–5.1)
Sodium: 126 mmol/L — ABNORMAL LOW (ref 135–145)

## 2021-08-02 NOTE — Progress Notes (Signed)
? ?'@Patient'$  ID: Samantha Mason, female    DOB: [birth year], 71 y.o.   MRN: [id number] ? ?Chief Complaint  ?Patient presents with  ? Follow-up  ? ? ?Referring provider: ?Glean Hess, MD ? ?HPI: ?71 year old female never smoker followed for obstructive sleep apnea with OHS, moderate persistent asthma and chronic hypoxic and hypercarbic respiratory failure ?Medical history significant for diastolic heart failure ? ?TEST/EVENTS :  ?**ABG 07/25/2018>> 7.47/40 1/80 3/29.8. ?  ?**CT chest 07/02/2018>> I  lung volumes are reduced consistent with restrictive lung disease from body habitus ?  ?**Chest x-ray 07/24/2018>>  increased lung markings suggestive of chronic bronchitis, hyperinflation suggestive of emphysema. ?  ?CT chest 04/2019 -Neg PE, no acute process.  ? ?CT chest February 13, 2021 showed no PE.  Negative for acute process.  Small airways disease. ? ?2D echo July 16, 2021 showed EF at 55 to 60%, mild LVH, ? ?08/02/2021 Follow up : OSA, O2 RF , D CHF , Post hospital follow up  ?Patient presents for posthospital follow-up.  Patient was admitted last week with acute on chronic hypercarbic and hypoxic respiratory failure with decompensated diastolic heart failure and asthmatic bronchitic exacerbation.  Patient initially required BiPAP support.  She was unable to tolerate BiPAP and was changed over to CPAP.  She was treated with aggressive diuresis.  Patient had worsening chronic kidney disease.  Her Lasix was stopped at discharge and changed over to [facility].  At discharge creatinine was at 2.61.  Patient was set up for a trilogy noninvasive vent at home.  She was also discharged with physical therapy and Occupational Therapy.  Since discharge patient is feeling some better, gets winded with minimal activity . Very sedentary. Still has leg swelling not as bad as in hospital .  ?Patient has underlying moderate persistent asthma.  He is currently on pulmicort neb Twice daily  . Says breathing is doing okay , no  flare of cough or wheezing .  ?Takes Duoneb most days 1-2 x a day .  ?Is on oxygen 2l/m , no increased oxygen demands.  ?Prior to admission patient was supposed to be on CPAP at bedtime.  Her machine has been broken.  And has not been able to wear this. ? ?Has Restless leg on requip which she thinks is helping .  ? ?Has CKD stage 3 . Referred to Nephrology to follow up after discharge.  ? ?Follows with Cardiology for Diastolic CHF .  ?Has not started on Demadex yet, pharmacy did not have it. Got call from them today to pick up.  ? ?Eating well w/ no n/v.d.  ? ?Lives at home with son. Drives.  ? ? ? ? ?Allergies  ?Allergen Reactions  ? Nuvigil [Armodafinil] Hives  ? Penicillins Diarrhea and Nausea And Vomiting  ?  Did it involve swelling of the face/tongue/throat, SOB, or low BP? no ?Did it involve sudden or severe rash/hives, skin peeling, or any reaction on the inside of your mouth or nose? No ?Did you need to seek medical attention at a hospital or doctor's office? No ?When did it last happen?  in her 28s    ?If all above answers are ?NO?, may proceed with cephalosporin use. ?  ? Gabapentin Other (See Comments)  ?  "wiped her out, couldn't stay awake"  ? Provigil [Modafinil] Hives  ? ? ?Immunization History  ?Administered Date(s) Administered  ? Fluad Quad(high Dose 65+) 02/15/2019, 02/16/2020, 02/19/2021  ? Influenza Split 02/23/2018  ? Influenza-Unspecified 02/16/2014, 01/18/2017  ?  Moderna Sars-Covid-2 Vaccination 01/05/2020, 02/01/2020  ? Pneumococcal Conjugate-13 05/16/2014  ? Pneumococcal Polysaccharide-23 03/12/2017, 07/16/2017  ? ? ?Past Medical History:  ?Diagnosis Date  ? Acute bronchitis   ? Anemia   ? Arthritis   ? Asthma   ? B12 deficiency 03/14/2017  ? Chest pain 07/02/2018  ? COPD (chronic obstructive pulmonary disease) ([place]) 07/24/2018  ? Family history of blood clots 07/16/2017  ? Irritable bowel syndrome   ? Malignant hypertension   ? Obesity   ? OSA (obstructive sleep apnea)   ? uses cpap  ?  Pneumonia 2017  ? Restless leg syndrome   ? Stroke [facility]) 1995  ? mild  ? ? ?Tobacco History: ?Social History  ? ?Tobacco Use  ?Smoking Status Never  ?Smokeless Tobacco Never  ? ?Counseling given: Not Answered ? ? ?Outpatient Medications Prior to Visit  ?Medication Sig Dispense Refill  ? acetaminophen (TYLENOL) 500 MG tablet Take 1,000 mg by mouth every 6 (six) hours as needed.    ? budesonide (PULMICORT) 0.5 MG/2ML nebulizer solution Take 2 mLs (0.5 mg total) by nebulization 2 (two) times daily. 120 mL 2  ? cetirizine (ZYRTEC) 10 MG tablet Take 1 tablet (10 mg total) by mouth daily. 30 tablet 2  ? cholestyramine (QUESTRAN) 4 g packet DISSOLVE AND DRINK 1 PACKET BY MOUTH AT BEDTIME 30 packet 11  ? diltiazem (CARDIZEM CD) 180 MG 24 hr capsule Take 1 capsule (180 mg total) by mouth daily. 90 capsule 1  ? ipratropium-albuterol (DUONEB) 0.5-2.5 (3) MG/3ML SOLN Take 3 mLs by nebulization every 6 (six) hours as needed. 360 mL 2  ? nystatin (MYCOSTATIN/NYSTOP) powder Apply topically 3 (three) times daily. 15 g 0  ? pantoprazole (PROTONIX) 40 MG tablet TAKE 1 TABLET BY MOUTH DAILY 90 tablet 1  ? rOPINIRole (REQUIP) 1 MG tablet Take 1 tablet (1 mg total) by mouth 3 (three) times daily. 90 tablet 1  ? torsemide 40 MG TABS Take 40 mg by mouth daily. 30 tablet 1  ? aspirin EC 81 MG EC tablet Take 1 tablet (81 mg total) by mouth daily. Swallow whole. 30 tablet 11  ? levalbuterol (XOPENEX) 1.25 MG/0.5ML nebulizer solution Take 1.25 mg by nebulization every 6 (six) hours as needed for wheezing or shortness of breath. 1 each 2  ? sodium chloride 0.9 % nebulizer solution as directed.    ? ?No facility-administered medications prior to visit.  ? ? ? ?Review of Systems:  ? ?Constitutional:   No  weight loss, night sweats,  Fevers, chills,  ?+fatigue, or  lassitude. ? ?HEENT:   No headaches,  Difficulty swallowing,  Tooth/dental problems, or  Sore throat,  ?              No sneezing, itching, ear ache, nasal congestion, post nasal drip,   ? ?CV:  No chest pain,  Orthopnea, PND, +swelling in lower extremities, anasarca, dizziness, palpitations, syncope.  ? ?GI  No heartburn, indigestion, abdominal pain, nausea, vomiting, diarrhea, change in bowel habits, loss of appetite, bloody stools.  ? ?Resp:   No chest wall deformity ? ?Skin: no rash or lesions. ? ?GU: no dysuria, change in color of urine, no urgency or frequency.  No flank pain, no hematuria  ? ?MS:  No joint pain or swelling.  No decreased range of motion.  No back pain. ? ? ? ?Physical Exam ? ?BP 130/60 (BP Location: Left Arm, Patient Position: Sitting, Cuff Size: Normal)   Pulse 82   Temp (!) 97.1 ?  F (36.2 ?C) (Oral)   Ht '5\' 4"'$  (1.626 m)   Wt (!) 339 lb 11.2 oz (154.1 kg)   SpO2 97%   BMI 58.31 kg/m?  ? ?GEN: A/Ox3; pleasant , NAD, well nourished chronically ill appearing in wc on O2  ?  ?HEENT:  [place]/AT,  , NOSE-clear, THROAT-clear, no lesions, no postnasal drip or exudate noted. Class 3-4 MP airway  ? ?NECK:  Supple w/ fair ROM; no JVD; normal carotid impulses w/o bruits; no thyromegaly or nodules palpated; no lymphadenopathy.   ? ?RESP  Clear  P & A; w/o, wheezes/ rales/ or rhonchi. no accessory muscle use, no dullness to percussion ? ?CARD:  RRR, no m/r/g, 1+ peripheral edema, pulses intact, no cyanosis or clubbing. ? ?GI:   Soft & nt; nml bowel sounds; no organomegaly or masses detected.  ? ?Musco: Warm bil, no deformities or joint swelling noted.  ? ?Neuro: alert, no focal deficits noted.   ? ?Skin: Warm, no lesions or rashes ? ? ? ?Lab Results: ? ?CBC ?   ?Component Value Date/Time  ? WBC 9.9 07/25/2021 0657  ? RBC 4.23 07/25/2021 0657  ? HGB 9.6 (L) 07/25/2021 0657  ? HGB 10.2 (L) 04/20/2014 1610  ? HCT 32.0 (L) 07/25/2021 0657  ? HCT 33.3 (L) 04/20/2014 1610  ? PLT 309 07/25/2021 0657  ? PLT 267 04/20/2014 1610  ? MCV 75.7 (L) 07/25/2021 0657  ? MCV 76 (L) 04/20/2014 1610  ? MCH 22.7 (L) 07/25/2021 0657  ? MCHC 30.0 07/25/2021 0657  ? RDW 17.2 (H) 07/25/2021 0657  ? RDW 17.4 (H)  04/20/2014 1610  ? LYMPHSABS 1.5 07/15/2021 0436  ? LYMPHSABS 1.6 04/20/2014 1610  ? MONOABS 0.6 07/15/2021 0436  ? MONOABS 0.4 04/20/2014 1610  ? EOSABS 0.5 07/15/2021 0436  ? EOSABS 0.2 04/20/2014 1610  ? BA

## 2021-08-02 NOTE — Progress Notes (Signed)
Agree with the details of the visit as noted by Rexene Edison, NP.  The patient's primary neurologist is Dr. Mortimer Fries.  She has significant obstructive sleep apnea. ? ? ?C. Derrill Kay, MD ?[facility] ?

## 2021-08-02 NOTE — Assessment & Plan Note (Signed)
Continue on Pulmicort twice daily.  DuoNeb as needed. ? ?Plan  ?Marland Kitchen ?Patient Instructions  ?Labs today .  ?Continue on Pulmicort Neb Twice daily, rinse after use.  ?Duoneb every 6hrs as needed.  ?Restart Demadex today .  ?Follow up with Cardiology as planned  ?Continue on Oxygen 2l/m  ?Referral to Nephrology .  ?Continue on Home PT/OT .  ?Wear Trilogy device At bedtime  and with naps.  ?Follow up with Dr. Patsey Berthold or APP in 3 weeks and .As needed   ?Please contact office for sooner follow up if symptoms do not improve or worsen or seek emergency care  ? ? ?  ? ?

## 2021-08-02 NOTE — Assessment & Plan Note (Signed)
Recent admission with acute on chronic hypercarbic and hypoxic respiratory failure with decompensated CHF. ?Patient is clinically improved since discharge.  Oxygen levels are maintained.  Patient is encouraged on using trilogy device at bedtime and naps. ?Continue on oxygen 2 L to maintain O2 saturations greater than 88 to 90%. ?

## 2021-08-02 NOTE — Assessment & Plan Note (Signed)
Healthy weight loss discussed 

## 2021-08-02 NOTE — Telephone Encounter (Signed)
Home Health Verbal Orders - Caller/Agency: Jenny Reichmann / [facility]  ?Callback Number: [phone] vm can be left  ?Requesting PT ?Frequency: 2xs a week for 5 weeks  ?1x a week for 4 weeks  ? ?Pt was discharge from [facility] on 3.10.23/ in the AVS, Hospital Wanted pt to take '81Mg'$  aspirin but pt will not take it and says it gives her stomach issues  ? ?They also told her to stop taking promethazine (PHENERGAN) 25 MG tablet  but she wasn't given anything else to take so she is still taking it / Jenny [facility]mann wanted to make PCP award of these things  ?

## 2021-08-02 NOTE — Assessment & Plan Note (Signed)
Continue on Requip. 

## 2021-08-02 NOTE — Patient Instructions (Signed)
Labs today .  ?Continue on Pulmicort Neb Twice daily, rinse after use.  ?Duoneb every 6hrs as needed.  ?Restart Demadex today .  ?Follow up with Cardiology as planned  ?Continue on Oxygen 2l/m  ?Referral to Nephrology .  ?Continue on Home PT/OT .  ?Wear Trilogy device At bedtime  and with naps.  ?Follow up with Dr. Patsey Berthold or APP in 3 weeks and .As needed   ?Please contact office for sooner follow up if symptoms do not improve or worsen or seek emergency care  ? ? ?

## 2021-08-02 NOTE — Assessment & Plan Note (Signed)
Acute on chronic kidney disease- ?Patient was changed from Lasix to Demadex.  Unfortunately patient is just now getting her Demadex today.  She is encouraged to start this as recommended.  We will check be met.  Patient was referred to nephrology but this has not been set up.  Nephrology referral will be sent ? ?Plan  ?Patient Instructions  ?Labs today .  ?Continue on Pulmicort Neb Twice daily, rinse after use.  ?Duoneb every 6hrs as needed.  ?Restart Demadex today .  ?Follow up with Cardiology as planned  ?Continue on Oxygen 2l/m  ?Referral to Nephrology .  ?Continue on Home PT/OT .  ?Wear Trilogy device At bedtime  and with naps.  ?Follow up with Dr. Patsey Berthold or APP in 3 weeks and .As needed   ?Please contact office for sooner follow up if symptoms do not improve or worsen or seek emergency care  ? ? ?  ? ?

## 2021-08-02 NOTE — Telephone Encounter (Signed)
Spoke with pt. Notified of Dr. Darnelle Bos recc below.  ?Pt voiced understanding. Has no further questions at this time.  ?

## 2021-08-02 NOTE — Assessment & Plan Note (Signed)
Obstructive sleep apnea with suspected OHS complicated by chronic hypercarbic and hypoxic respiratory failure ?Patient is encouraged on trilogy compliance.  Long discussion regarding the potential cardiovascular complications of untreated sleep apnea and OHS along with chronic hypercarbia ?On return we will get trilogy download ? ?- discussed how weight can impact sleep and risk for sleep disordered breathing ?- discussed options to assist with weight loss: combination of diet modification, cardiovascular and strength training exercises ?  ?- had an extensive discussion regarding the adverse health consequences related to untreated sleep disordered breathing ?- specifically discussed the risks for hypertension, coronary artery disease, cardiac dysrhythmias, cerebrovascular disease, and diabetes ?- lifestyle modification discussed ?  ?- discussed how sleep disruption can increase risk of accidents, particularly when driving ?- safe driving practices were discussed ?  ? ?Plan  ?PLAN:  ?Patient Instructions  ?Labs today .  ?Continue on Pulmicort Neb Twice daily, rinse after use.  ?Duoneb every 6hrs as needed.  ?Restart Demadex today .  ?Follow up with Cardiology as planned  ?Continue on Oxygen 2l/m  ?Referral to Nephrology .  ?Continue on Home PT/OT .  ?Wear Trilogy device At bedtime  and with naps.  ?Follow up with Dr. Patsey Berthold or APP in 3 weeks and .As needed   ?Please contact office for sooner follow up if symptoms do not improve or worsen or seek emergency care  ? ? ?  ? ?

## 2021-08-03 ENCOUNTER — Ambulatory Visit: Payer: Medicare Other | Admitting: Family

## 2021-08-03 NOTE — Telephone Encounter (Signed)
Called Cindy left VM giving verbal orders.  KP 

## 2021-08-03 NOTE — Telephone Encounter (Signed)
FYI

## 2021-08-05 NOTE — Progress Notes (Signed)
Name: Samantha Mason  DOB: 2050/11/10  MRN: [id number] ? ?Mrs. Hajjar is a 71 y.o. F with diastolic CHF, and COPD with chronic respiratory failure on 2 L nasal cannula as well as obstructive sleep apnea on CPAP, stage IIIb chronic kidney disease, hypertension and morbid obesity.  ? ?Admitted 2/26-3/10/23 for acute on chronic respiratory failure and HF exacerbation. Diuresed and eventually discharged back home.  ? ?Echo from 07/16/21 reviewed and showed EF 55-60% with mild LVH.  ? ?She presents to the heart failure clinic for her initial visit with a chief complaint of moderate fatigue with minimal exertion and SOB. She describes this as chronic in nature over the last several months, but has noticed an improvement in her symptoms since her hospital discharge. She also endorses sleeping difficulty, and pedal edema. She denies CP, palpitations, dizziness, orthopnea or weight gain. She was discharged on 3/10 on demadex, but was not able to pick it up until 3/16.  ? ?She has home health RN and PT coming to her home since discharge. She is weighing daily, checking her BP and SPO2 daily. She reports one day of a 4 lb weight gain but that was prior to starting her demadex and her weight has returned to her baseline since starting. ? ?Past Medical History:  ?Diagnosis Date  ? Acute bronchitis   ? Anemia   ? Arthritis   ? Asthma   ? B12 deficiency 03/14/2017  ? Chest pain 07/02/2018  ? COPD (chronic obstructive pulmonary disease) ([place]) 07/24/2018  ? Family history of blood clots 07/16/2017  ? Irritable bowel syndrome   ? Malignant hypertension   ? Obesity   ? OSA (obstructive sleep apnea)   ? uses cpap  ? Pneumonia 2017  ? Restless leg syndrome   ? Stroke [facility]) 1995  ? mild  ? ?Past Surgical History:  ?Procedure Laterality Date  ? CHOLECYSTECTOMY    ? COLONOSCOPY WITH PROPOFOL N/A 03/31/2018  ? Procedure: COLONOSCOPY WITH PROPOFOL;  Surgeon: Lucilla Lame, MD;  [facility] END[facility]COPY;  Service: Endoscopy;  Laterality: N/A;  ?  KNEE ARTHROPLASTY Right 04/30/2019  ? Procedure: COMPUTER ASSISTED TOTAL KNEE ARTHROPLASTY;  Surgeon: Dereck Leep, MD;  Location: [facility] ORS;  Service: Orthopedics;  Laterality: Right;  ? saliva gland removal  1998  ? TOTAL KNEE ARTHROPLASTY Left 2014  ? TUBAL LIGATION Bilateral   ? ?Family History  ?Problem Relation Age of Onset  ? Dementia Mother   ? COPD Father   ?     79  ? Diabetes Father   ? Heart failure Brother   ? Coronary artery disease Brother 7  ?     CABG  ? Heart failure Maternal Grandmother   ? Stroke Maternal Grandmother   ? Breast cancer Neg Hx   ? ?Social History  ? ?Tobacco Use  ? Smoking status: Never  ? Smokeless tobacco: Never  ?Substance Use Topics  ? Alcohol use: No  ?  Alcohol/week: 0.0 standard drinks  ? ?Allergies  ?Allergen Reactions  ? Nuvigil [Armodafinil] Hives  ? Penicillins Diarrhea and Nausea And Vomiting  ?  Did it involve swelling of the face/tongue/throat, SOB, or low BP? no ?Did it involve sudden or severe rash/hives, skin peeling, or any reaction on the inside of your mouth or nose? No ?Did you need to seek medical attention at a hospital or doctor's office? No ?When did it last happen?  in her 61s    ?If all above answers are ?NO?, may proceed with  cephalosporin use. ?  ? Gabapentin Other (See Comments)  ?  "wiped her out, couldn't stay awake"  ? Provigil [Modafinil] Hives  ? ?Prior to Admission medications   ?Medication Sig Start Date End Date Taking? Authorizing Provider  ?acetaminophen (TYLENOL) 500 MG tablet Take 1,000 mg by mouth every 6 (six) hours as needed.    [provider]  ?budesonide (PULMICORT) 0.5 MG/2ML nebulizer solution Take 2 mLs (0.5 mg total) by nebulization 2 (two) times daily. 06/26/21 06/26/22  Tyler Pita, MD  ?cetirizine (ZYRTEC) 10 MG tablet Take 1 tablet (10 mg total) by mouth daily. 12/07/14   Ashok Norris, MD  ?cholestyramine Lucrezia Starch) 4 g packet DISSOLVE AND DRINK 1 PACKET BY MOUTH AT BEDTIME 03/14/21   Glean Hess, MD   ?diltiazem (CARDIZEM CD) 180 MG 24 hr capsule Take 1 capsule (180 mg total) by mouth daily. 03/28/21 09/24/21  End, Harrell Gave, MD  ?ipratropium-albuterol (DUONEB) 0.5-2.5 (3) MG/3ML SOLN Take 3 mLs by nebulization every 6 (six) hours as needed. 06/26/21   Tyler Pita, MD  ?nystatin (MYCOSTATIN/NYSTOP) powder Apply topically 3 (three) times daily. 07/27/21   Annita Brod, MD  ?pantoprazole (PROTONIX) 40 MG tablet TAKE 1 TABLET BY MOUTH DAILY 09/26/20 09/26/21  Glean Hess, MD  ?rOPINIRole (REQUIP) 1 MG tablet Take 1 tablet (1 mg total) by mouth 3 (three) times daily. 07/27/21   Annita Brod, MD  ?torsemide 40 MG TABS Take 40 mg by mouth daily. 07/28/21   Annita Brod, MD  ? Review of Systems  ?Constitutional:  Positive for malaise/fatigue.  ?HENT: Negative.    ?Eyes:  Negative for blurred vision and double vision.  ?Respiratory:  Positive for shortness of breath and wheezing. Negative for cough and hemoptysis.   ?Cardiovascular:  Positive for leg swelling. Negative for chest pain, palpitations and orthopnea.  ?Gastrointestinal: Negative.   ?Genitourinary: Negative.   ?Musculoskeletal:  Positive for neck pain.  ?Skin: Negative.   ?Neurological:  Negative for dizziness and weakness.  ?Endo/Heme/Allergies: Negative.   ?Psychiatric/Behavioral:  The patient has insomnia (has trilogy vent, has not used through the night yet).    ? ?Physical Exam ?Vitals and nursing note reviewed. Exam conducted with a chaperone present (son).  ?Constitutional:   ?   General: She is not in acute distress. ?   Appearance: She is obese. She is ill-appearing. She is not toxic-appearing.  ?Cardiovascular:  ?   Rate and Rhythm: Regular rhythm. Tachycardia present.  ?   Heart sounds: No murmur heard. ?Pulmonary:  ?   Effort: No tachypnea.  ?   Breath sounds: No decreased breath sounds, wheezing, rhonchi or rales.  ?Abdominal:  ?   Palpations: Abdomen is soft.  ?Musculoskeletal:  ?   Right lower leg: Edema (+1) present.  ?    Left lower leg: Edema (+1) present.  ?Skin: ?   General: Skin is warm and dry.  ?   Capillary Refill: Capillary refill takes less than 2 seconds.  ?   Coloration: Skin is pale.  ?Neurological:  ?   General: No focal deficit present.  ?   Mental Status: She is alert and oriented to person, place, and time.  ?Psychiatric:     ?   Mood and Affect: Mood normal.  ?  ?Vitals:  ? 08/06/21 1314  ?BP: (!) 159/58  ?Pulse: (!) 103  ?Resp: 18  ?SpO2: 96%  ?Weight: (!) 337 lb (152.9 kg)  ?Height: '5\' 4"'$  (1.626 m)  ? ?Wt Readings  from Last 3 Encounters:  ?08/06/21 (!) 337 lb (152.9 kg)  ?08/02/21 (!) 339 lb 11.2 oz (154.1 kg)  ?07/27/21 (!) 332 lb 14.4 oz (151 kg)  ? ?  ?Lab Results  ?Component Value Date  ? CREATININE 1.59 (H) 08/02/2021  ? CREATININE 2.61 (H) 07/27/2021  ? CREATININE 2.47 (H) 07/26/2021  ? ? ?Assessment and Plan: ? ?Heart failure with preserved ejection fraction and structural changes- ?- NYHA III ?- euvolemic today ?- weighing daily, reviewed when to call to report weight gain ?- reported baseline weight 322, today is 332 which is similar to her hospital discharge weight ?- discussed low sodium diet, provided low sodium cookbook ?- already adhering to fluid restriction ~ 1,000 cc/day ?- on torsemide ?- was on lisinopril but stopped d/t cough ?- discussed SGLT2, will check labs and if ok will start jardiance ?- encouraged her to wear compression socks each day  ?- BNP 07/15/21 103.2 ?- will see cardiology Sharolyn Douglas) on 09/05/21 ? ?2. HTN- ?- BP today 159/68 in office; at home 127/73 ?- keeping a BP log, reviewed 120-140's/80's ?- previously on lisinopril but d/c'd d/t cough, states they started her on diltiazem for BP control ?- will see PCP this week for medicare visit; returns on 08/27/21 for post hospital f/u [facility]) ?- BMP reviewed 08/02/21 Na 133, K 4.4, Cr 1.45, GFR 39 ? ?3. OSA/COPD- ?- wears O2 2 lpm since d/c ?- saw pulmonology Patsey Berthold) 08/02/21; returns on 09/12/21 ?- has trilogy vent that she is wearing  intermittently throughout the day in anticipation of wearing at night ? ?Medications verbally reviewed. ? ?Return in 2 months, sooner if needed.  ? ?

## 2021-08-06 ENCOUNTER — Encounter: Payer: Self-pay | Admitting: Family

## 2021-08-06 ENCOUNTER — Telehealth: Payer: Self-pay | Admitting: Internal Medicine

## 2021-08-06 ENCOUNTER — Other Ambulatory Visit
Admission: RE | Admit: 2021-08-06 | Discharge: 2021-08-06 | Disposition: A | Discharge status: Home / Self Care | Payer: Medicare Other | Source: Ambulatory Visit | Attending: Family | Admitting: Family

## 2021-08-06 ENCOUNTER — Other Ambulatory Visit: Payer: Self-pay

## 2021-08-06 ENCOUNTER — Ambulatory Visit (HOSPITAL_BASED_OUTPATIENT_CLINIC_OR_DEPARTMENT_OTHER): Payer: Medicare Other | Admitting: Family

## 2021-08-06 VITALS — BP 159/58 | HR 103 | Resp 18 | Ht 64.0 in | Wt 337.0 lb

## 2021-08-06 DIAGNOSIS — J449 Chronic obstructive pulmonary disease, unspecified: Secondary | ICD-10-CM | POA: Diagnosis not present

## 2021-08-06 DIAGNOSIS — R0602 Shortness of breath: Secondary | ICD-10-CM

## 2021-08-06 DIAGNOSIS — I130 Hypertensive heart and chronic kidney disease with heart failure and stage 1 through stage 4 chronic kidney disease, or unspecified chronic kidney disease: Secondary | ICD-10-CM | POA: Insufficient documentation

## 2021-08-06 DIAGNOSIS — N1832 Chronic kidney disease, stage 3b: Secondary | ICD-10-CM | POA: Insufficient documentation

## 2021-08-06 DIAGNOSIS — I5032 Chronic diastolic (congestive) heart failure: Secondary | ICD-10-CM | POA: Insufficient documentation

## 2021-08-06 DIAGNOSIS — I10 Essential (primary) hypertension: Secondary | ICD-10-CM | POA: Diagnosis not present

## 2021-08-06 DIAGNOSIS — G4733 Obstructive sleep apnea (adult) (pediatric): Secondary | ICD-10-CM | POA: Insufficient documentation

## 2021-08-06 LAB — BASIC METABOLIC PANEL
Anion gap: 11 (ref 5–15)
BUN: 35 mg/dL — ABNORMAL HIGH (ref 8–23)
CO2: 31 mmol/L (ref 22–32)
Calcium: 9.4 mg/dL (ref 8.9–10.3)
Chloride: 91 mmol/L — ABNORMAL LOW (ref 98–111)
Creatinine, Ser: 1.45 mg/dL — ABNORMAL HIGH (ref 0.44–1.00)
GFR, Estimated: 39 mL/min — ABNORMAL LOW (ref >60)
Glucose, Bld: 114 mg/dL — ABNORMAL HIGH (ref 70–99)
Potassium: 4.4 mmol/L (ref 3.5–5.1)
Sodium: 133 mmol/L — ABNORMAL LOW (ref 135–145)

## 2021-08-06 NOTE — Patient Instructions (Signed)
Return in 2 months. ? ?Continue to weigh daily. If you have a gain of 3 lbs/night or 5 lbs/week, call us. ? ?Consider getting some compression socks, place on in am and then remove at night.  ? ? ?

## 2021-08-06 NOTE — Telephone Encounter (Signed)
Spoke to [place] gave her verbal orders to move OT to this week. She verbalized understanding. ? ?KP ?

## 2021-08-06 NOTE — Telephone Encounter (Signed)
Samantha Mason needs to move her OT eval to this week/ she was not able to get a hold of her last week / please advise  ?

## 2021-08-07 ENCOUNTER — Telehealth: Payer: Self-pay | Admitting: Family

## 2021-08-07 MED ORDER — EMPAGLIFLOZIN 10 MG PO TABS: 10.0000 mg | ORAL_TABLET | Freq: Every day | ORAL | 5 refills | Status: DC

## 2021-08-07 NOTE — Telephone Encounter (Signed)
Will begin jardiance '10mg'$  daily. RX sent to [facility] [place] ?

## 2021-08-08 ENCOUNTER — Ambulatory Visit (INDEPENDENT_AMBULATORY_CARE_PROVIDER_SITE_OTHER): Payer: Medicare Other

## 2021-08-08 DIAGNOSIS — Z5941 Food insecurity: Secondary | ICD-10-CM | POA: Diagnosis not present

## 2021-08-08 DIAGNOSIS — Z0000 Encounter for general adult medical examination without abnormal findings: Secondary | ICD-10-CM | POA: Diagnosis not present

## 2021-08-08 DIAGNOSIS — Z599 Problem related to housing and economic circumstances, unspecified: Secondary | ICD-10-CM

## 2021-08-08 NOTE — Progress Notes (Signed)
? ?Subjective:  ? Samantha Mason is a 71 y.o. female who presents for an Initial Medicare Annual Wellness Visit. ? ?Virtual Visit via Telephone Note ? ?I connected with  [name] on 08/08/21 at  2:40 PM EDT by telephone and verified that I am speaking with the correct person using two identifiers. ? ?Location: ?Patient: home ?Provider: [facility] ?Persons participating in the virtual visit: patient/Nurse Health Advisor ?  ?I discussed the limitations, risks, security and privacy concerns of performing an evaluation and management service by telephone and the availability of in person appointments. The patient expressed understanding and agreed to proceed. ? ?Interactive audio and video telecommunications were attempted between this nurse and patient, however failed, due to patient having technical difficulties OR patient did not have access to video capability.  We continued and completed visit with audio only. ? ?Some vital signs may be absent or patient reported.  ? ?[name], LPN ? ? ?Review of Systems    ? ?Cardiac Risk Factors include: advanced age (>66mn, >>52women);obesity (BMI >30kg/m2);sedentary lifestyle ? ?   ?Objective:  ?  ?There were no vitals filed for this visit. ?There is no height or weight on file to calculate BMI. ? ? ?  08/08/2021  ?  2:59 PM 07/15/2021  ?  5:01 PM 03/06/2021  ?  4:55 PM 03/05/2021  ?  3:21 PM 02/13/2021  ?  2:03 PM 09/19/2020  ?  4:48 PM 08/29/2020  ? 12:33 PM  ?Advanced Directives  ?Does Patient Have a Medical Advance Directive? No No No No No Yes Yes  ?Type of AVisual merchandiserof AHughesLiving will Living will;Healthcare Power of Attorney  ?Does patient want to make changes to medical advance directive?      No - Patient declined No - Patient declined  ?Copy of HHubbardin Chart?       No - copy requested  ?Would patient like information on creating a medical advance directive? Yes (MAU/Ambulatory/Procedural Areas - Information  given) No - Patient declined No - Patient declined No - Patient declined No - Patient declined    ? ? ?Current Medications (verified) ?Outpatient Encounter Medications as of 08/08/2021  ?Medication Sig  ? acetaminophen (TYLENOL) 500 MG tablet Take 1,000 mg by mouth every 6 (six) hours as needed.  ? budesonide (PULMICORT) 0.5 MG/2ML nebulizer solution Take 2 mLs (0.5 mg total) by nebulization 2 (two) times daily.  ? cetirizine (ZYRTEC) 10 MG tablet Take 1 tablet (10 mg total) by mouth daily.  ? cholestyramine (QUESTRAN) 4 g packet DISSOLVE AND DRINK 1 PACKET BY MOUTH AT BEDTIME  ? diltiazem (CARDIZEM CD) 180 MG 24 hr capsule Take 1 capsule (180 mg total) by mouth daily.  ? empagliflozin (JARDIANCE) 10 MG TABS tablet Take 1 tablet (10 mg total) by mouth daily before breakfast.  ? ipratropium-albuterol (DUONEB) 0.5-2.5 (3) MG/3ML SOLN Take 3 mLs by nebulization every 6 (six) hours as needed.  ? nystatin (MYCOSTATIN/NYSTOP) powder Apply topically 3 (three) times daily.  ? pantoprazole (PROTONIX) 40 MG tablet TAKE 1 TABLET BY MOUTH DAILY  ? rOPINIRole (REQUIP) 1 MG tablet Take 1 tablet (1 mg total) by mouth 3 (three) times daily.  ? torsemide (DEMADEX) 20 MG tablet Take 40 mg by mouth daily.  ? [DISCONTINUED] torsemide 40 MG TABS Take 40 mg by mouth daily.  ? ?No facility-administered encounter medications on file as of 08/08/2021.  ? ? ?Allergies (verified) ?Nuvigil [armodafinil],  Penicillins, Gabapentin, Provigil [modafinil], and Lisinopril  ? ?History: ?Past Medical History:  ?Diagnosis Date  ? Acute bronchitis   ? Anemia   ? Arthritis   ? Asthma   ? B12 deficiency 03/14/2017  ? Chest pain 07/02/2018  ? CHF (congestive heart failure) ([place])   ? COPD (chronic obstructive pulmonary disease) ([place]) 07/24/2018  ? Family history of blood clots 07/16/2017  ? Irritable bowel syndrome   ? Malignant hypertension   ? Obesity   ? OSA (obstructive sleep apnea)   ? uses cpap  ? Pneumonia 2017  ? Restless leg syndrome   ? Stroke [facility])  1995  ? mild  ? ?Past Surgical History:  ?Procedure Laterality Date  ? CHOLECYSTECTOMY    ? COLONOSCOPY WITH PROPOFOL N/A 03/31/2018  ? Procedure: COLONOSCOPY WITH PROPOFOL;  Surgeon: Lucilla Lame, MD;  Location: [facility] ENDOSCOPY;  Service: Endoscopy;  Laterality: N/A;  ? KNEE ARTHROPLASTY Right 04/30/2019  ? Procedure: COMPUTER ASSISTED TOTAL KNEE ARTHROPLASTY;  Surgeon: Dereck Leep, MD;  Location: [facility] ORS;  Service: Orthopedics;  Laterality: Right;  ? saliva gland removal  1998  ? TOTAL KNEE ARTHROPLASTY Left 2014  ? TUBAL LIGATION Bilateral   ? ?Family History  ?Problem Relation Age of Onset  ? Dementia Mother   ? COPD Father   ?     77  ? Diabetes Father   ? Heart failure Brother   ? Coronary artery disease Brother 2  ?     CABG  ? Heart failure Maternal Grandmother   ? Stroke Maternal Grandmother   ? Breast cancer Neg Hx   ? ?Social History  ? ?Socioeconomic History  ? Marital status: Widowed  ?  Spouse name: Not on file  ? Number of children: 2  ? Years of education: Not on file  ? Highest education level: Not on file  ?Occupational History  ? Not on file  ?Tobacco Use  ? Smoking status: Never  ? Smokeless tobacco: Never  ?Vaping Use  ? Vaping Use: Never used  ?Substance and Sexual Activity  ? Alcohol use: No  ?  Alcohol/week: 0.0 standard drinks  ? Drug use: No  ? Sexual activity: Never  ?Other Topics Concern  ? Not on file  ?Social History Narrative  ? Pt lives with her son  ? ?Social Determinants of Health  ? ?Financial Resource Strain: Medium Risk  ? Difficulty of Paying Living Expenses: Somewhat hard  ?Food Insecurity: Food Insecurity Present  ? Worried About Charity fundraiser in the Last Year: Often true  ? Ran Out of Food in the Last Year: Often true  ?Transportation Needs: No Transportation Needs  ? Lack of Transportation (Medical): No  ? Lack of Transportation (Non-Medical): No  ?Physical Activity: Inactive  ? Days of Exercise per Week: 0 days  ? Minutes of Exercise per Session: 0 min   ?Stress: No Stress Concern Present  ? Feeling of Stress : Only a little  ?Social Connections: Socially Isolated  ? Frequency of Communication with Friends and Family: Once a week  ? Frequency of Social Gatherings with Friends and Family: Never  ? Attends Religious Services: Never  ? Active Member of Clubs or Organizations: No  ? Attends Archivist Meetings: Never  ? Marital Status: Widowed  ? ? ?Tobacco Counseling ?Counseling given: Not Answered ? ? ?Clinical Intake: ? ?Pre-visit preparation completed: Yes ? ?Pain : No/denies pain ? ?  ? ?Nutritional Risks: Nausea/ vomitting/ diarrhea (recent episode of n/v  due to excess phlegm) ?Diabetes: No ? ?How often do you need to have someone help you when you read instructions, pamphlets, or other written materials from your doctor or pharmacy?: 1 - Never ? ? ? ?Interpreter Needed?: No ? ?Information entered by :: [name] LPN ? ? ?Activities of Daily Living ? ?  08/08/2021  ?  3:00 PM 08/06/2021  ?  1:13 PM  ?In your present state of health, do you have any difficulty performing the following activities:  ?Hearing? 0 0  ?Vision? 0 0  ?Difficulty concentrating or making decisions? 0 0  ?Walking or climbing stairs? 0 0  ?Dressing or bathing? 0 0  ?Doing errands, shopping? 0 0  ?Preparing Food and eating ? N   ?Using the Toilet? N   ?In the past six months, have you accidently leaked urine? N   ?Do you have problems with loss of bowel control? N   ?Managing your Medications? N   ?Managing your Finances? N   ?Housekeeping or managing your Housekeeping? N   ? ? ?Patient Care Team: ?Glean Hess, MD as PCP - General (Internal Medicine) ?Nelva Bush, MD as PCP - Cardiology (Cardiology) ?Flora Lipps, MD as Consulting Physician (Pulmonary Disease) ?Murlean Iba, MD (Nephrology) ? ?Indicate any recent Medical Services you may have received from other than [facility] providers in the past year (date may be approximate). ? ?   ?Assessment:  ? This is a routine  wellness examination for R[place]ia. ? ?Hearing/Vision screen ?Hearing Screening - Comments:: Pt denies hearing difficulty ?Vision Screening - Comments:: Annual vision screenings done at [facility]  ? ?Dietary i

## 2021-08-08 NOTE — Patient Instructions (Signed)
Ms. Filkins , ?Thank you for taking time to come for your Medicare Wellness Visit. I appreciate your ongoing commitment to your health goals. Please review the following plan we discussed and let me know if I can assist you in the future.  ? ?Screening recommendations/referrals: ?Colonoscopy: done 03/31/18. Repeat 03/2023 ?Mammogram: done 05/30/20. Please call [phone] to schedule your mammogram.  ?Bone Density: done 05/30/20 ?Recommended yearly ophthalmology/optometry visit for glaucoma screening and checkup ?Recommended yearly dental visit for hygiene and checkup ? ?Vaccinations: ?Influenza vaccine: done 02/19/21 ?Pneumococcal vaccine: done 07/16/17 ?Tdap vaccine: due ?Shingles vaccine: Shingrix discussed. Please contact your pharmacy for coverage information.  ?Covid-19: done 01/05/20 & 02/01/20 ? ?Advanced directives: Advance directive discussed with you today. I have provided a copy for you to complete at home and have notarized. Once this is complete please bring a copy in to our office so we can scan it into your chart.  ? ?Conditions/risks identified: Recommend physical activity as tolerated ? ?Next appointment: Follow up in one year for your annual wellness visit  ? ? ?Preventive Care 39 Years and Older, Female ?Preventive care refers to lifestyle choices and visits with your health care provider that can promote health and wellness. ?What does preventive care include? ?A yearly physical exam. This is also called an annual well check. ?Dental exams once or twice a year. ?Routine eye exams. Ask your health care provider how often you should have your eyes checked. ?Personal lifestyle choices, including: ?Daily care of your teeth and gums. ?Regular physical activity. ?Eating a healthy diet. ?Avoiding tobacco and drug use. ?Limiting alcohol use. ?Practicing safe sex. ?Taking low-dose aspirin every day. ?Taking vitamin and mineral supplements as recommended by your health care provider. ?What happens during an  annual well check? ?The services and screenings done by your health care provider during your annual well check will depend on your age, overall health, lifestyle risk factors, and family history of disease. ?Counseling  ?Your health care provider may ask you questions about your: ?Alcohol use. ?Tobacco use. ?Drug use. ?Emotional well-being. ?Home and relationship well-being. ?Sexual activity. ?Eating habits. ?History of falls. ?Memory and ability to understand (cognition). ?Work and work Statistician. ?Reproductive health. ?Screening  ?You may have the following tests or measurements: ?Height, weight, and BMI. ?Blood pressure. ?Lipid and cholesterol levels. These may be checked every 5 years, or more frequently if you are over 29 years old. ?Skin check. ?Lung cancer screening. You may have this screening every year starting at age 24 if you have a 30-pack-year history of smoking and currently smoke or have quit within the past 15 years. ?Fecal occult blood test (FOBT) of the stool. You may have this test every year starting at age 97. ?Flexible sigmoidoscopy or colonoscopy. You may have a sigmoidoscopy every 5 years or a colonoscopy every 10 years starting at age 96. ?Hepatitis C blood test. ?Hepatitis B blood test. ?Sexually transmitted disease (STD) testing. ?Diabetes screening. This is done by checking your blood sugar (glucose) after you have not eaten for a while (fasting). You may have this done every 1-3 years. ?Bone density scan. This is done to screen for osteoporosis. You may have this done starting at age 39. ?Mammogram. This may be done every 1-2 years. Talk to your health care provider about how often you should have regular mammograms. ?Talk with your health care provider about your test results, treatment options, and if necessary, the need for more tests. ?Vaccines  ?Your health care provider may recommend certain vaccines,  such as: ?Influenza vaccine. This is recommended every year. ?Tetanus,  diphtheria, and acellular pertussis (Tdap, Td) vaccine. You may need a Td booster every 10 years. ?Zoster vaccine. You may need this after age 52. ?Pneumococcal 13-valent conjugate (PCV13) vaccine. One dose is recommended after age 54. ?Pneumococcal polysaccharide (PPSV23) vaccine. One dose is recommended after age 109. ?Talk to your health care provider about which screenings and vaccines you need and how often you need them. ?This information is not intended to replace advice given to you by your health care provider. Make sure you discuss any questions you have with your health care provider. ?Document Released: 06/02/2015 Document Revised: 01/24/2016 Document Reviewed: 03/07/2015 ?Elsevier Interactive Patient Education ? 2017 [place]. ? ?Fall Prevention in the Home ?Falls can cause injuries. They can happen to people of all ages. There are many things you can do to make your home safe and to help prevent falls. ?What can I do on the outside of my home? ?Regularly fix the edges of walkways and driveways and fix any cracks. ?Remove anything that might make you trip as you walk through a door, such as a raised step or threshold. ?Trim any bushes or trees on the path to your home. ?Use bright outdoor lighting. ?Clear any walking paths of anything that might make someone trip, such as rocks or tools. ?Regularly check to see if handrails are loose or broken. Make sure that both sides of any steps have handrails. ?Any raised decks and porches should have guardrails on the edges. ?Have any leaves, snow, or ice cleared regularly. ?Use sand or salt on walking paths during winter. ?Clean up any spills in your garage right away. This includes oil or grease spills. ?What can I do in the bathroom? ?Use night lights. ?Install grab bars by the toilet and in the tub and shower. Do not use towel bars as grab bars. ?Use non-skid mats or decals in the tub or shower. ?If you need to sit down in the shower, use a plastic,  non-slip stool. ?Keep the floor dry. Clean up any water that spills on the floor as soon as it happens. ?Remove soap buildup in the tub or shower regularly. ?Attach bath mats securely with double-sided non-slip rug tape. ?Do not have throw rugs and other things on the floor that can make you trip. ?What can I do in the bedroom? ?Use night lights. ?Make sure that you have a light by your bed that is easy to reach. ?Do not use any sheets or blankets that are too big for your bed. They should not hang down onto the floor. ?Have a firm chair that has side arms. You can use this for support while you get dressed. ?Do not have throw rugs and other things on the floor that can make you trip. ?What can I do in the kitchen? ?Clean up any spills right away. ?Avoid walking on wet floors. ?Keep items that you use a lot in easy-to-reach places. ?If you need to reach something above you, use a strong step stool that has a grab bar. ?Keep electrical cords out of the way. ?Do not use floor polish or wax that makes floors slippery. If you must use wax, use non-skid floor wax. ?Do not have throw rugs and other things on the floor that can make you trip. ?What can I do with my stairs? ?Do not leave any items on the stairs. ?Make sure that there are handrails on both sides of the stairs and  use them. Fix handrails that are broken or loose. Make sure that handrails are as long as the stairways. ?Check any carpeting to make sure that it is firmly attached to the stairs. Fix any carpet that is loose or worn. ?Avoid having throw rugs at the top or bottom of the stairs. If you do have throw rugs, attach them to the floor with carpet tape. ?Make sure that you have a light switch at the top of the stairs and the bottom of the stairs. If you do not have them, ask someone to add them for you. ?What else can I do to help prevent falls? ?Wear shoes that: ?Do not have high heels. ?Have rubber bottoms. ?Are comfortable and fit you well. ?Are closed  at the toe. Do not wear sandals. ?If you use a stepladder: ?Make sure that it is fully opened. Do not climb a closed stepladder. ?Make sure that both sides of the stepladder are locked into place. ?Ask someone to

## 2021-08-09 ENCOUNTER — Other Ambulatory Visit (HOSPITAL_COMMUNITY): Payer: Self-pay

## 2021-08-10 ENCOUNTER — Telehealth: Payer: Self-pay | Admitting: Internal Medicine

## 2021-08-10 NOTE — Telephone Encounter (Signed)
Tilda Franco with verbal order she verbalized understanding. ? ?KP ?

## 2021-08-10 NOTE — Telephone Encounter (Signed)
Samantha Mason ?OT from [facility] phone # [phone] ?Okay to leave a message on secure line ? ?Requesting verbal orders for OT for 2x for 6 weeks  [phone] ?

## 2021-08-14 ENCOUNTER — Telehealth: Payer: Self-pay

## 2021-08-14 ENCOUNTER — Other Ambulatory Visit: Payer: Self-pay | Admitting: Internal Medicine

## 2021-08-14 DIAGNOSIS — K219 Gastro-esophageal reflux disease without esophagitis: Secondary | ICD-10-CM

## 2021-08-14 NOTE — Telephone Encounter (Signed)
? ?  Telephone encounter was:  Successful.  ?08/14/2021 ?Name: [name] MRN: [id number] DOB: [birth year] ? ?Samantha Mason is a 71 y.o. year old female who is a primary care patient of Glean Hess, MD . The community resource team was consulted for assistance with Financial Difficulties related to utilities ? ?Care guide performed the following interventions: Spoke with patient per her request verified email address and sent information for [employer] [place], [place], [employer] the [employer]. Patient stated that she did not need food resources. Letter saved in Epic. ? ?Follow Up Plan:  Care guide will follow up with patient by phone over the next 7 days ? ?Mohan Erven, [place], CHC ?Care Guide  Embedded Care Coordination ?[place]  [facility]  ?[address] ?[place], [place] [address] ?[email or web address]  ?? [phone]   ?[email or web address] ?  ?

## 2021-08-15 NOTE — Telephone Encounter (Signed)
Requested Prescriptions  ?Pending Prescriptions Disp Refills  ?? pantoprazole (PROTONIX) 40 MG tablet [Pharmacy Med Name: PANTOPRAZOLE SOD DR 40 MG TAB] 90 tablet 1  ?  Sig: TAKE 1 TABLET BY MOUTH EVERY DAY  ?  ? Gastroenterology: Proton Pump Inhibitors Passed - 08/14/2021  1:51 AM  ?  ?  Passed - Valid encounter within last 12 months  ?  Recent Outpatient Visits   ?      ? 5 months ago Essential hypertension  ? [facility] Glean Hess, MD  ? 10 months ago Asthma in adult, moderate persistent, with acute exacerbation  ? [facility] Glean Hess, MD  ? 1 year ago Annual physical exam  ? [facility] Glean Hess, MD  ? 2 years ago Annual physical exam  ? [facility] Glean Hess, MD  ? 2 years ago Essential hypertension  ? [facility] Glean Hess, MD  ?  ?  ?Future Appointments   ?        ? In 1 week Glean Hess, MD [facility], [place]  ? In 3 weeks Theora Gianotti, NP [facility], [facility]  ? In 6 months [place] Melia Jesse Sans, MD [facility], [place]  ?  ? ?  ?  ?  ? ?

## 2021-08-16 ENCOUNTER — Telehealth: Payer: Self-pay

## 2021-08-16 ENCOUNTER — Other Ambulatory Visit (INDEPENDENT_AMBULATORY_CARE_PROVIDER_SITE_OTHER): Payer: Medicare Other | Admitting: Internal Medicine

## 2021-08-16 DIAGNOSIS — I130 Hypertensive heart and chronic kidney disease with heart failure and stage 1 through stage 4 chronic kidney disease, or unspecified chronic kidney disease: Secondary | ICD-10-CM

## 2021-08-16 DIAGNOSIS — J9622 Acute and chronic respiratory failure with hypercapnia: Secondary | ICD-10-CM

## 2021-08-16 DIAGNOSIS — G2581 Restless legs syndrome: Secondary | ICD-10-CM

## 2021-08-16 DIAGNOSIS — E871 Hypo-osmolality and hyponatremia: Secondary | ICD-10-CM

## 2021-08-16 DIAGNOSIS — N1832 Chronic kidney disease, stage 3b: Secondary | ICD-10-CM

## 2021-08-16 DIAGNOSIS — K589 Irritable bowel syndrome without diarrhea: Secondary | ICD-10-CM

## 2021-08-16 DIAGNOSIS — G4733 Obstructive sleep apnea (adult) (pediatric): Secondary | ICD-10-CM

## 2021-08-16 DIAGNOSIS — D631 Anemia in chronic kidney disease: Secondary | ICD-10-CM

## 2021-08-16 DIAGNOSIS — Z9981 Dependence on supplemental oxygen: Secondary | ICD-10-CM

## 2021-08-16 DIAGNOSIS — I5033 Acute on chronic diastolic (congestive) heart failure: Secondary | ICD-10-CM

## 2021-08-16 DIAGNOSIS — I252 Old myocardial infarction: Secondary | ICD-10-CM

## 2021-08-16 DIAGNOSIS — Z9181 History of falling: Secondary | ICD-10-CM

## 2021-08-16 DIAGNOSIS — M1990 Unspecified osteoarthritis, unspecified site: Secondary | ICD-10-CM

## 2021-08-16 DIAGNOSIS — J441 Chronic obstructive pulmonary disease with (acute) exacerbation: Secondary | ICD-10-CM

## 2021-08-16 DIAGNOSIS — J9621 Acute and chronic respiratory failure with hypoxia: Secondary | ICD-10-CM | POA: Diagnosis not present

## 2021-08-16 DIAGNOSIS — E538 Deficiency of other specified B group vitamins: Secondary | ICD-10-CM

## 2021-08-16 DIAGNOSIS — F5101 Primary insomnia: Secondary | ICD-10-CM

## 2021-08-16 DIAGNOSIS — K219 Gastro-esophageal reflux disease without esophagitis: Secondary | ICD-10-CM

## 2021-08-16 NOTE — Progress Notes (Signed)
Received orders from [facility]. ?Start of care 07/31/21.   Orders from 07/31/21 through 09/28/21 are reviewed, signed and faxed.  ? ?

## 2021-08-16 NOTE — Telephone Encounter (Signed)
? ?  Telephone encounter was:  Successful.  ?08/16/2021 ?Name: Samantha Mason MRN: [id number] DOB: 23-Jun-1950 ? ?[name] is a 71 y.o. year old female who is a primary care patient of Glean Hess, MD . The community resource team was consulted for assistance with Financial Difficulties related to utilities ? ?Care guide performed the following interventions: Spoke with patient, she has received resources for utility assistance emailed on 08/14/21. No further assistance is needed at this time. ? ?Follow Up Plan:  No further follow up planned at this time. The patient has been provided with needed resources. ? ?Chrisie Jankovich, [place], CHC ?Care Guide  Embedded Care Coordination ?[place]  Care Management  ?[address] ?[place], [place] [address] ?[email or web address]  ?? [phone]   ?[email or web address] ?  ?

## 2021-08-18 ENCOUNTER — Other Ambulatory Visit: Payer: Self-pay | Admitting: Internal Medicine

## 2021-08-20 NOTE — Telephone Encounter (Signed)
Requested medication (s) are due for refill today: yes ? ?Requested medication (s) are on the active medication list: yes ? ?Last refill:  07/27/21 ? ?Future visit scheduled: yes ? ?Notes to clinic:  Unable to refill per protocol, last refill by another provider.  ? ? ?  ?Requested Prescriptions  ?Pending Prescriptions Disp Refills  ? rOPINIRole (REQUIP) 1 MG tablet [Pharmacy Med Name: ROPINIROLE HCL 1 MG TABLET] 90 tablet 1  ?  Sig: TAKE 1 TABLET BY MOUTH THREE TIMES A DAY AS NEEDED  ?  ? Neurology:  Parkinsonian Agents Failed - 08/18/2021  1:33 PM  ?  ?  Failed - Last BP in normal range  ?  BP Readings from Last 1 Encounters:  ?08/06/21 (!) 159/58  ?  ?  ?  ?  Passed - Last Heart Rate in normal range  ?  Pulse Readings from Last 1 Encounters:  ?08/06/21 (!) 103  ?  ?  ?  ?  Passed - Valid encounter within last 12 months  ?  Recent Outpatient Visits   ? ?      ? 6 months ago Essential hypertension  ? [facility] Glean Hess, MD  ? 10 months ago Asthma in adult, moderate persistent, with acute exacerbation  ? [facility] Glean Hess, MD  ? 1 year ago Annual physical exam  ? [facility] Glean Hess, MD  ? 2 years ago Annual physical exam  ? [facility] Glean Hess, MD  ? 2 years ago Essential hypertension  ? [facility] Glean Hess, MD  ? ?  ?  ?Future Appointments   ? ?        ? In 1 week Glean Hess, MD [facility], [place]  ? In 2 weeks Theora Gianotti, NP [facility], [place], [facility]  ? In 6 months [place] Melia Jesse Sans, MD [facility], [place]  ? ?  ? ?  ?  ?  ? ? ?

## 2021-08-21 ENCOUNTER — Telehealth: Payer: Self-pay

## 2021-08-21 NOTE — Telephone Encounter (Signed)
Copied from [place] [id number]. Topic: General - Other ?>> Aug 21, 2021  3:18 PM Tessa Lerner A wrote: ?Reason for CRM: Tiffany with [facility] has called to share that the patient will be unable to be seen for their OT visits this week due to the therapist being sick  ? ?The visits were scheduled to occur on 08/21/21 and 08/23/21 ? ?Please contact further if needed ?

## 2021-08-21 NOTE — Telephone Encounter (Signed)
Noted  KP 

## 2021-08-27 ENCOUNTER — Encounter: Payer: Self-pay | Admitting: Internal Medicine

## 2021-08-27 ENCOUNTER — Ambulatory Visit (INDEPENDENT_AMBULATORY_CARE_PROVIDER_SITE_OTHER): Payer: Medicare Other | Admitting: Internal Medicine

## 2021-08-27 VITALS — BP 136/82 | HR 73 | Ht 64.0 in | Wt 337.0 lb

## 2021-08-27 DIAGNOSIS — N1831 Chronic kidney disease, stage 3a: Secondary | ICD-10-CM

## 2021-08-27 DIAGNOSIS — K580 Irritable bowel syndrome with diarrhea: Secondary | ICD-10-CM | POA: Diagnosis not present

## 2021-08-27 DIAGNOSIS — R7303 Prediabetes: Secondary | ICD-10-CM | POA: Diagnosis not present

## 2021-08-27 DIAGNOSIS — I5032 Chronic diastolic (congestive) heart failure: Secondary | ICD-10-CM | POA: Diagnosis not present

## 2021-08-27 DIAGNOSIS — K219 Gastro-esophageal reflux disease without esophagitis: Secondary | ICD-10-CM | POA: Diagnosis not present

## 2021-08-27 DIAGNOSIS — I5033 Acute on chronic diastolic (congestive) heart failure: Secondary | ICD-10-CM | POA: Insufficient documentation

## 2021-08-27 NOTE — Progress Notes (Signed)
? ? ?Date:  08/27/2021  ? ?Name:  Samantha Mason   DOB:  04/27/1951   MRN:  [id number] ? ? ?Chief Complaint: Hospitalization Follow-up ? ?Gastroesophageal Reflux ?She complains of coughing and heartburn. She reports no chest pain, no choking or no wheezing. This is a recurrent problem. The problem occurs occasionally. Pertinent negatives include no fatigue. She has tried a PPI for the symptoms. The treatment provided significant relief.  ?CHF with preserved ejection fraction - followed by the heart failure clinic.  Now on Entresto and Torsemide plus Jardiance.  She is also on BiPap with oxygen and prn oxygen. She is still very limited physically but definitely stabilizing for now.   ?CRI - recent BMP showed improvement in the AKI from February. ? ?Lab Results  ?Component Value Date  ? NA 133 (L) 08/06/2021  ? K 4.4 08/06/2021  ? CO2 31 08/06/2021  ? GLUCOSE 114 (H) 08/06/2021  ? BUN 35 (H) 08/06/2021  ? CREATININE 1.45 (H) 08/06/2021  ? CALCIUM 9.4 08/06/2021  ? EGFR 47 (L) 11/16/2020  ? GFRNONAA 39 (L) 08/06/2021  ? ?Lab Results  ?Component Value Date  ? CHOL 160 07/16/2021  ? HDL 81 07/16/2021  ? [place] 68 07/16/2021  ? TRIG 57 07/16/2021  ? CHOLHDL 2.0 07/16/2021  ? ?Lab Results  ?Component Value Date  ? TSH 0.488 09/20/2020  ? ?Lab Results  ?Component Value Date  ? HGBA1C 5.4 07/15/2021  ? ?Lab Results  ?Component Value Date  ? WBC 9.9 07/25/2021  ? HGB 9.6 (L) 07/25/2021  ? HCT 32.0 (L) 07/25/2021  ? MCV 75.7 (L) 07/25/2021  ? PLT 309 07/25/2021  ? ?Lab Results  ?Component Value Date  ? ALT 10 03/05/2021  ? AST 16 03/05/2021  ? ALKPHOS 71 03/05/2021  ? BILITOT 0.6 03/05/2021  ? ?Lab Results  ?Component Value Date  ? VD25OH 24.39 (L) 03/10/2021  ?  ? ?Review of Systems  ?Constitutional:  Negative for chills, fatigue and fever.  ?Eyes:  Negative for visual disturbance.  ?Respiratory:  Positive for cough and shortness of breath. Negative for choking and wheezing.   ?Cardiovascular:  Positive for leg swelling  (much improved). Negative for chest pain.  ?Gastrointestinal:  Positive for heartburn.  ?Genitourinary:  Negative for vaginal discharge (or vaginal yeast sym).  ?Musculoskeletal:  Negative for joint swelling.  ?Skin:  Negative for color change and rash.  ?Allergic/Immunologic: Positive for environmental allergies.  ?Neurological:  Negative for speech difficulty and weakness.  ?Psychiatric/Behavioral:  Negative for dysphoric mood and sleep disturbance. The patient is not nervous/anxious.   ? ?Patient Active Problem List  ? Diagnosis Date Noted  ? Chronic heart failure with preserved ejection fraction (HFpEF) ([place]) 08/27/2021  ? Hyponatremia 07/26/2021  ? Insomnia 07/22/2021  ? Stage 3a chronic kidney disease (CKD) ([place]) 07/15/2021  ? Morbid obesity ([place]) 07/15/2021  ? Myocardial injury 07/15/2021  ? History of stroke 11/17/2020  ? Ovarian failure 02/21/2020  ? Primary osteoarthritis of right knee 02/13/2019  ? S/P total knee arthroplasty, left 02/13/2019  ? Aortic atherosclerosis ([place]) 08/01/2018  ? Obstructive lung disease (generalized) ([place]) 07/24/2018  ? B12 deficiency anemia 07/07/2018  ? Benign neoplasm of ascending colon   ? Severe persistent asthma 02/13/2018  ? Vitamin D deficiency 03/17/2017  ? Prediabetes 03/14/2017  ? Microcytic anemia 03/14/2017  ? Irritable bowel syndrome with diarrhea 03/12/2017  ? Allergic rhinitis 03/12/2017  ? Restless leg syndrome 03/12/2017  ? GERD (gastroesophageal reflux disease) 10/16/2016  ? Essential hypertension  10/16/2016  ? Morbid obesity with BMI of 50.0-59.9, adult ([place]) 12/08/2014  ? OSA  12/08/2014  ? Shortness of breath 04/07/2014  ? ? ?Allergies  ?Allergen Reactions  ? Nuvigil [Armodafinil] Hives  ? Penicillins Diarrhea and Nausea And Vomiting  ?  Did it involve swelling of the face/tongue/throat, SOB, or low BP? no ?Did it involve sudden or severe rash/hives, skin peeling, or any reaction on the inside of your mouth or nose? No ?Did you need to seek medical attention  at a hospital or doctor's office? No ?When did it last happen?  in her 49s    ?If all above answers are ?NO?, may proceed with cephalosporin use. ?  ? Gabapentin Other (See Comments)  ?  "wiped her out, couldn't stay awake"  ? Provigil [Modafinil] Hives  ? Lisinopril Cough  ? ? ?Past Surgical History:  ?Procedure Laterality Date  ? CHOLECYSTECTOMY    ? COLONOSCOPY WITH PROPOFOL N/A 03/31/2018  ? Procedure: COLONOSCOPY WITH PROPOFOL;  Surgeon: Lucilla Lame, MD;  Location[facility]COPY;  Service: Endoscopy;  Laterality: N/A;  ? KNEE ARTHROPLASTY Right 04/30/2019  ? Procedure: COMPUTER ASSISTED TOTAL KNEE ARTHROPLASTY;  Surgeon: Dereck Leep, MD;  Location: [facility] ORS;  Service: Orthopedics;  Laterality: Right;  ? saliva gland removal  1998  ? TOTAL KNEE ARTHROPLASTY Left 2014  ? TUBAL LIGATION Bilateral   ? ? ?Social History  ? ?Tobacco Use  ? Smoking status: Never  ? Smokeless tobacco: Never  ?Vaping Use  ? Vaping Use: Never used  ?Substance Use Topics  ? Alcohol use: No  ?  Alcohol/week: 0.0 standard drinks  ? Drug use: No  ? ? ? ?Medication list has been reviewed and updated. ? ?Current Meds  ?Medication Sig  ? acetaminophen (TYLENOL) 500 MG tablet Take 1,000 mg by mouth every 6 (six) hours as needed.  ? budesonide (PULMICORT) 0.5 MG/2ML nebulizer solution Take 2 mLs (0.5 mg total) by nebulization 2 (two) times daily.  ? cetirizine (ZYRTEC) 10 MG tablet Take 1 tablet (10 mg total) by mouth daily.  ? cholestyramine (QUESTRAN) 4 g packet DISSOLVE AND DRINK 1 PACKET BY MOUTH AT BEDTIME  ? diltiazem (CARDIZEM CD) 180 MG 24 hr capsule Take 1 capsule (180 mg total) by mouth daily.  ? empagliflozin (JARDIANCE) 10 MG TABS tablet Take 1 tablet (10 mg total) by mouth daily before breakfast.  ? ipratropium-albuterol (DUONEB) 0.5-2.5 (3) MG/3ML SOLN Take 3 mLs by nebulization every 6 (six) hours as needed.  ? NON FORMULARY Bipap - nightly with Oxygen 2 Lpm  ? nystatin (MYCOSTATIN/NYSTOP) powder Apply topically 3 (three)  times daily.  ? OXYGEN Inhale into the lungs. 2 LPM [place]  ? pantoprazole (PROTONIX) 40 MG tablet TAKE 1 TABLET BY MOUTH EVERY DAY  ? rOPINIRole (REQUIP) 1 MG tablet Take 1 tablet (1 mg total) by mouth 3 (three) times daily.  ? sacubitril-valsartan (ENTRESTO) 24-26 MG Take 1 tablet by mouth 2 (two) times daily.  ? torsemide (DEMADEX) 20 MG tablet Take 40 mg by mouth daily.  ? ? ? ?  08/27/2021  ?  2:10 PM 08/06/2021  ?  1:14 PM 02/19/2021  ? 10:02 AM 10/05/2020  ? 11:10 AM  ?GAD 7 : Generalized Anxiety Score  ?Nervous, Anxious, on Edge 0 0 0 0  ?Control/stop worrying 0 0 0 0  ?Worry too much - different things  0 0 0  ?Trouble relaxing 0 0 0 0  ?Restless 0 0 0 0  ?Easily annoyed  or irritable 0 0 0 0  ?Afraid - awful might happen 0 0 0 0  ?Total GAD 7 Score  0 0 0  ?Anxiety Difficulty Not difficult at all Not difficult at all    ? ? ? ?  08/27/2021  ?  2:10 PM  ?Depression screen PHQ 2/9  ?Decreased Interest 1  ?Down, Depressed, Hopeless 1  ?PHQ - 2 Score 2  ?Altered sleeping 2  ?Tired, decreased energy 2  ?Change in appetite 0  ?Feeling bad or failure about yourself  0  ?Trouble concentrating 0  ?Moving slowly or fidgety/restless 0  ?Suicidal thoughts 0  ?PHQ-9 Score 6  ?Difficult doing work/chores Not difficult at all  ? ? ?BP Readings from Last 3 Encounters:  ?08/27/21 136/82  ?08/06/21 (!) 159/58  ?08/02/21 130/60  ? ? ?Physical Exam ?Constitutional:   ?   Appearance: She is obese.  ?Eyes:  ?   Comments: Eyelids inflamed 2/2 allergies  ?Cardiovascular:  ?   Rate and Rhythm: Normal rate and regular rhythm.  ?   Pulses: Normal pulses.  ?Pulmonary:  ?   Effort: Pulmonary effort is normal.  ?   Breath sounds: No wheezing or rhonchi.  ?Musculoskeletal:     ?   General: No swelling or tenderness.  ?   Cervical back: Normal range of motion.  ?Lymphadenopathy:  ?   Cervical: No cervical adenopathy.  ?Skin: ?   General: Skin is warm and dry.  ?Neurological:  ?   General: No focal deficit present.  ?   Mental Status: She is alert.   ?Psychiatric:     ?   Mood and Affect: Mood normal.  ? ? ?Wt Readings from Last 3 Encounters:  ?08/27/21 (!) 337 lb (152.9 kg)  ?08/06/21 (!) 337 lb (152.9 kg)  ?08/02/21 (!) 339 lb 11.2 oz (154.1 kg)  ?

## 2021-09-03 ENCOUNTER — Telehealth: Payer: Self-pay | Admitting: Family

## 2021-09-03 ENCOUNTER — Other Ambulatory Visit: Payer: Self-pay | Admitting: Family

## 2021-09-03 MED ORDER — TORSEMIDE 20 MG PO TABS: 20.0000 mg | ORAL_TABLET | Freq: Every day | ORAL | 0 refills | Status: DC

## 2021-09-03 NOTE — Progress Notes (Signed)
Developed itching w/ entresto do this was stopped by Ventric[facility] HF provider ?

## 2021-09-03 NOTE — Progress Notes (Signed)
Updated torsemide dose ?

## 2021-09-03 NOTE — Telephone Encounter (Signed)
[facility] results received on 08/31/21: ? ?Sodium 139 ?Potassium 4.8 ?Creatinine 2.17 ?BUN 70 ?GFR 24 ?BNP 10.7 ?

## 2021-09-04 ENCOUNTER — Ambulatory Visit: Payer: Medicare Other | Admitting: Internal Medicine

## 2021-09-04 ENCOUNTER — Ambulatory Visit: Payer: Self-pay

## 2021-09-04 ENCOUNTER — Telehealth: Payer: Self-pay | Admitting: Adult Health

## 2021-09-04 MED ORDER — AZITHROMYCIN 250 MG PO TABS: ORAL_TABLET | ORAL | 0 refills | Status: AC

## 2021-09-04 NOTE — Telephone Encounter (Signed)
Zpak sent to preferred pharmacy. ?Patient is aware and voiced her understanding.  ?Nothing further needed.  ? ?

## 2021-09-04 NOTE — Telephone Encounter (Signed)
?  Chief Complaint: SOB ?Symptoms: SOB, cough, weight gain of 4lbs in 24 hrs ?Frequency: since Sunday ?Pertinent Negatives: NA ?Disposition: '[]'$ ED /'[]'$ Urgent Care (no appt availability in office) / '[x]'$ Appointment(In office/virtual)/ '[]'$  [facility]/ '[]'$ Home Care/ '[]'$ Refused Recommended Disposition /'[]'$ [facility]/ '[]'$  Follow-up with PCP ?Additional Notes: pt states she has been doing breathing tx about every 4-6 hrs and not really helping and states normally this will be bronchitis but didn't want to end up waiting and getting put in the hospital. Pt also has 4 lb weight gain in 1 day and states she will normally get a text message from the CHF provider but hadn't received one yet but wanted to start with Dr. Army Melia and see what needs to be done. Pt was just requesting a Zpak to be called in but advised her appt is needed. Scheduled for today at 1320.  ? ?Reason for Disposition ? [1] MILD difficulty breathing (e.g., minimal/no SOB at rest, SOB with walking, pulse <100) AND [2] NEW-onset or WORSE than normal ? ?Answer Assessment - Initial Assessment Questions ?1. RESPIRATORY STATUS: "Describe your breathing?" (e.g., wheezing, shortness of breath, unable to speak, severe coughing)  ?    SOB ?2. ONSET: "When did this breathing problem begin?"  ?    Sunday ?3. PATTERN "Does the difficult breathing come and go, or has it been constant since it started?"  ?    Comes and goes ?4. SEVERITY: "How bad is your breathing?" (e.g., mild, moderate, severe)  ?  - MILD: No SOB at rest, mild SOB with walking, speaks normally in sentences, can lie down, no retractions, pulse < 100.  ?  - MODERATE: SOB at rest, SOB with minimal exertion and prefers to sit, cannot lie down flat, speaks in phrases, mild retractions, audible wheezing, pulse 100-120.  ?  - SEVERE: Very SOB at rest, speaks in single words, struggling to breathe, sitting hunched forward, retractions, pulse > 120  ?    Mild to moderate ?5. RECURRENT SYMPTOM:  "Have you had difficulty breathing before?" If Yes, ask: "When was the last time?" and "What happened that time?"  ?    Yes  ?6. CARDIAC HISTORY: "Do you have any history of heart disease?" (e.g., heart attack, angina, bypass surgery, angioplasty)  ?    CHF ?7. LUNG HISTORY: "Do you have any history of lung disease?"  (e.g., pulmonary embolus, asthma, emphysema) ?    Yes ?8. CAUSE: "What do you think is causing the breathing problem?"  ?    bronchitis ?9. OTHER SYMPTOMS: "Do you have any other symptoms? (e.g., dizziness, runny nose, cough, chest pain, fever) ?    Cough and sore throat, 4 lb weight gain ? ?Protocols used: Breathing Difficulty-A-AH ? ?

## 2021-09-04 NOTE — Telephone Encounter (Signed)
Spoke to patient.  ?She is requesting a Rx for Zpak. ?She feels that she has developed bronchitis.  ?C/o chest tightness, dry cough, wheezing, increased SOB and 4lb weight gain since last night. She has left a message for cardiology.  ?Using albuterol solution Q4H and pulmicort BID ?Spo2 maintaining around 92% on roomair.   ? ? ?Dr. Mortimer Fries, please advise. Thanks ?

## 2021-09-05 ENCOUNTER — Encounter: Payer: Self-pay | Admitting: Nurse Practitioner

## 2021-09-05 ENCOUNTER — Ambulatory Visit (INDEPENDENT_AMBULATORY_CARE_PROVIDER_SITE_OTHER): Payer: Medicare Other | Admitting: Nurse Practitioner

## 2021-09-05 ENCOUNTER — Other Ambulatory Visit
Admission: RE | Admit: 2021-09-05 | Discharge: 2021-09-05 | Disposition: A | Discharge status: Home / Self Care | Payer: Medicare Other | Attending: Nurse Practitioner | Admitting: Nurse Practitioner

## 2021-09-05 VITALS — BP 150/69 | HR 103 | Ht 64.0 in | Wt 334.5 lb

## 2021-09-05 DIAGNOSIS — I10 Essential (primary) hypertension: Secondary | ICD-10-CM

## 2021-09-05 DIAGNOSIS — I5032 Chronic diastolic (congestive) heart failure: Secondary | ICD-10-CM

## 2021-09-05 DIAGNOSIS — I509 Heart failure, unspecified: Secondary | ICD-10-CM | POA: Insufficient documentation

## 2021-09-05 DIAGNOSIS — R000 Tachycardia, unspecified: Secondary | ICD-10-CM

## 2021-09-05 DIAGNOSIS — N183 Chronic kidney disease, stage 3 unspecified: Secondary | ICD-10-CM | POA: Diagnosis not present

## 2021-09-05 LAB — BASIC METABOLIC PANEL
Anion gap: 9 (ref 5–15)
BUN: 43 mg/dL — ABNORMAL HIGH (ref 8–23)
CO2: 26 mmol/L (ref 22–32)
Calcium: 9.4 mg/dL (ref 8.9–10.3)
Chloride: 99 mmol/L (ref 98–111)
Creatinine, Ser: 1.76 mg/dL — ABNORMAL HIGH (ref 0.44–1.00)
GFR, Estimated: 31 mL/min — ABNORMAL LOW (ref >60)
Glucose, Bld: 97 mg/dL (ref 70–99)
Potassium: 4.1 mmol/L (ref 3.5–5.1)
Sodium: 134 mmol/L — ABNORMAL LOW (ref 135–145)

## 2021-09-05 MED ORDER — DILTIAZEM HCL ER COATED BEADS 240 MG PO CP24: 240.0000 mg | ORAL_CAPSULE | Freq: Every day | ORAL | 3 refills | Status: DC

## 2021-09-05 NOTE — Progress Notes (Signed)
? ? ?Office Visit  ?  ?Patient Name: Samantha Mason ?Date of Encounter: 09/05/2021 ? ?Primary Care Provider:  Glean Hess, MD ?Primary Cardiologist:  Nelva Bush, MD ? ?Chief Complaint  ?  ?71 year old female with a history of chronic HFpEF, hypertension, asthma, stroke (1995), morbid obesity, and obstructive sleep apnea, who presents for follow-up today related to heart failure. ? ?Past Medical History  ?  ?Past Medical History:  ?Diagnosis Date  ? Acute bronchitis   ? Anemia   ? Arthritis   ? Asthma   ? B12 deficiency 03/14/2017  ? Chest pain 07/02/2018  ? Chest pain   ? a. 06/2018 MV: EF 66%. No ischemia/infarct; b. 02/2021 PET CT: EF>60%, no ischemia/infarct.  ? Chronic heart failure with preserved ejection fraction (HFpEF) ([place])   ? a. 08/2020 Echo: EF 55-60%, GrI DD; b. 06/2021 Echo: EF 55-60%, mild LVH, nl RV fxn, no signif valvular dzs.  ? COPD (chronic obstructive pulmonary disease) ([place]) 07/24/2018  ? Family history of blood clots 07/16/2017  ? Irritable bowel syndrome   ? Malignant hypertension   ? Obesity   ? OSA (obstructive sleep apnea)   ? uses cpap  ? Pneumonia 2017  ? Restless leg syndrome   ? Stroke [facility]) 1995  ? mild  ? ?Past Surgical History:  ?Procedure Laterality Date  ? CHOLECYSTECTOMY    ? COLONOSCOPY WITH PROPOFOL N/A 03/31/2018  ? Procedure: COLONOSCOPY WITH PROPOFOL;  Surgeon: Lucilla Lame, MD;  Location: [facility] END[facility];  Service: Endoscopy;  Laterality: N/A;  ? KNEE ARTHROPLASTY Right 04/30/2019  ? Procedure: COMPUTER ASSISTED TOTAL KNEE ARTHROPLASTY;  Surgeon: Dereck Leep, MD;  Location: [facility] ORS;  Service: Orthopedics;  Laterality: Right;  ? saliva gland removal  1998  ? TOTAL KNEE ARTHROPLASTY Left 2014  ? TUBAL LIGATION Bilateral   ? ? ?Allergies ? ?Allergies  ?Allergen Reactions  ? Nuvigil [Armodafinil] Hives  ? Penicillins Diarrhea and Nausea And Vomiting  ?  Did it involve swelling of the face/tongue/throat, SOB, or low BP? no ?Did it involve sudden or severe  rash/hives, skin peeling, or any reaction on the inside of your mouth or nose? No ?Did you need to seek medical attention at a hospital or doctor's office? No ?When did it last happen?  in her 67s    ?If all above answers are ?NO?, may proceed with cephalosporin use. ?  ? Gabapentin Other (See Comments)  ?  "wiped her out, couldn't stay awake"  ? Provigil [Modafinil] Hives  ? Entresto [Sacubitril-Valsartan] Itching  ? Lisinopril Cough  ? ? ?History of Present Illness  ?  ?71 year old female with a history of chronic HFpEF, hypertension, asthma, stroke (1995), morbid obesity, and obstructive sleep apnea.  In the setting of atypical chest pain, she previously underwent stress testing in February 2020, which was low risk with normal LV function.  She was hospitalized in April 2022 with dyspnea felt to be secondary to asthma exacerbation.  Echocardiogram showed an EF of 55 to 60% with grade 1 diastolic dysfunction.  Following discharge, she re-established care in our office in June 2022 in the setting of ongoing dyspnea and volume overload, at which time she was hypertensive and her diltiazem therapy was titrated while Lasix 40 mg daily was added.  Volume status was noted to be improved at follow-up in August 2022.  She was admitted with asthma and COPD exacerbation October 2022 and subsequently underwent PET/CT at [facility] in early October 2022, which showed no evidence of  ischemia. ? ?Ms. Gibbon was readmitted to [facility] in late February 2023 in the setting of dyspnea and acute on chronic HFpEF with 37 pound weight gain prior to admission.  She underwent aggressive IV diuresis with 31 pound weight loss during admission and she was discharged home at 328 pounds on March 10, on torsemide 40 mg daily.  Echocardiogram during admission showed an EF of 55 to 60% without any significant valvular disease.  She subsequently followed up in heart failure clinic on March 20, at which time she was noted to have 1+ bilateral lower  extremity edema and weight was stable compared to discharge.  She was felt to be euvolemic.  Jardiance therapy was added.  She was subsequently placed on Entresto by the provider at [facility].  She developed severe itching with this and it was discontinued.  She had follow-up lab work early last week, notable for a potassium 4.8, BUN of 70, and creatinine of 2.17.  Over the past week, she has been dealing with some upper respiratory congestion and is currently on a Z-Pak.  In that setting, she has been more short of breath over the past few days.  Her weight was up 2 pounds this morning and she received notification from [facility] that they would like her to increase her torsemide to 40 mg daily for the time being.  She has not noticed any significant change in abdominal girth or lower extremity swelling.  She denies chest pain, palpitations, PND, dizziness, syncope, or early satiety.  She has chronic orthopnea which is unchanged. ? ?Home Medications  ?  ?Current Outpatient Medications  ?Medication Sig Dispense Refill  ? acetaminophen (TYLENOL) 500 MG tablet Take 1,000 mg by mouth every 6 (six) hours as needed.    ? azithromycin (ZITHROMAX) 250 MG tablet Take 2 tablets (500 mg) on  Day 1,  followed by 1 tablet (250 mg) once daily on Days 2 through 5. 6 each 0  ? budesonide (PULMICORT) 0.5 MG/2ML nebulizer solution Take 2 mLs (0.5 mg total) by nebulization 2 (two) times daily. 120 mL 2  ? cetirizine (ZYRTEC) 10 MG tablet Take 1 tablet (10 mg total) by mouth daily. 30 tablet 2  ? cholestyramine (QUESTRAN) 4 g packet DISSOLVE AND DRINK 1 PACKET BY MOUTH AT BEDTIME 30 packet 11  ? diltiazem (CARDIZEM CD) 240 MG 24 hr capsule Take 1 capsule (240 mg total) by mouth daily. 90 capsule 3  ? empagliflozin (JARDIANCE) 10 MG TABS tablet Take 1 tablet (10 mg total) by mouth daily before breakfast. 30 tablet 5  ? ipratropium-albuterol (DUONEB) 0.5-2.5 (3) MG/3ML SOLN Take 3 mLs by nebulization every 6 (six) hours as  needed. 360 mL 2  ? NON FORMULARY Bipap - nightly with Oxygen 2 Lpm    ? nystatin (MYCOSTATIN/NYSTOP) powder Apply topically 3 (three) times daily. 15 g 0  ? OXYGEN Inhale into the lungs. 2 LPM [place]    ? pantoprazole (PROTONIX) 40 MG tablet TAKE 1 TABLET BY MOUTH EVERY DAY 90 tablet 0  ? rOPINIRole (REQUIP) 1 MG tablet Take 1 tablet (1 mg total) by mouth 3 (three) times daily. 90 tablet 1  ? torsemide (DEMADEX) 20 MG tablet Take 1 tablet (20 mg total) by mouth daily. (Patient taking differently: Take 40 mg by mouth daily.) 30 tablet 0  ? ?No current facility-administered medications for this visit.  ?  ? ?Review of Systems  ?  ?Some increase in dyspnea on exertion in the setting of recent  bronchitis for which she is on a Z-Pak.  Weight was up 2 pounds and torsemide dose was adjusted this morning.  She has chronic orthopnea.  She denies chest pain, palpitations, PND, dizziness, syncope, edema, or early satiety.  All other systems reviewed and are otherwise negative except as noted above. ?  ? ?Physical Exam  ?  ?VS:  BP (!) 150/69 (BP Location: Left Arm, Patient Position: Sitting, Cuff Size: Large)   Pulse (!) 103   Ht '5\' 4"'$  (1.626 m)   Wt (!) 334 lb 8 oz (151.7 kg)   SpO2 97%   BMI 57.42 kg/m?  , BMI Body mass index is 57.42 kg/m?. ?    ?GEN: Obese, in no acute distress. ?HEENT: normal. ?Neck: Supple, obese, difficult to gauge JVP.  No bruits or masses.   ?Cardiac: RRR, no murmurs, rubs, or gallops. No clubbing, cyanosis, trace bilateral ankle edema.  Radials 2+/PT 1+ and equal bilaterally.  ?Respiratory:  Respirations regular and unlabored, clear to auscultation bilaterally. ?GI: Obese, soft, nontender, nondistended, BS + x 4. ?MS: no deformity or atrophy. ?Skin: warm and dry, no rash. ?Neuro:  Strength and sensation are intact. ?Psych: Normal affect. ? ?Accessory Clinical Findings  ?  ?ECG personally reviewed by me today -sinus tachycardia, 104- no acute changes. ? ?Lab Results  ?Component Value Date  ? WBC 9.9  07/25/2021  ? HGB 9.6 (L) 07/25/2021  ? HCT 32.0 (L) 07/25/2021  ? MCV 75.7 (L) 07/25/2021  ? PLT 309 07/25/2021  ? ?Lab Results  ?Component Value Date  ? CREATININE 1.76 (H) 09/05/2021  ? BUN 43 (H) 09/05/2021  ? NA 1

## 2021-09-05 NOTE — Patient Instructions (Signed)
Medication Instructions:  ?Your physician has recommended you make the following change in your medication:  ? ?INCREASE Diltiazem to 240 mg once daily  ? ? ?*If you need a refill on your cardiac medications before your next appointment, please call your pharmacy* ? ? ?Lab Work: ?BMET over at the [facility] entrance and check in at registration.  ? ?If you have labs (blood work) drawn today and your tests are completely normal, you will receive your results only by: ?MyChart Message (if you have MyChart) OR ?A paper copy in the mail ?If you have any lab test that is abnormal or we need to change your treatment, we will call you to review the results. ? ? ?Testing/Procedures: ?None ? ? ?Follow-Up: ?At [facility], you and your health needs are our priority.  As part of our continuing mission to provide you with exceptional heart care, we have created designated Provider Care Teams.  These Care Teams include your primary Cardiologist (physician) and Advanced Practice Providers (APPs -  Physician Assistants and Nurse Practitioners) who all work together to provide you with the care you need, when you need it. ? ? ?Your next appointment:   ?2 month(s) ? ?The format for your next appointment:   ?In Person ? ?Provider:   ?Nelva Bush, MD or Murray Hodgkins, NP  ? ? ? ? ?Important Information About Sugar ? ? ? ? ?  ?

## 2021-09-06 ENCOUNTER — Other Ambulatory Visit: Payer: Self-pay | Admitting: *Deleted

## 2021-09-06 DIAGNOSIS — I5032 Chronic diastolic (congestive) heart failure: Secondary | ICD-10-CM

## 2021-09-06 DIAGNOSIS — I509 Heart failure, unspecified: Secondary | ICD-10-CM

## 2021-09-06 DIAGNOSIS — N183 Chronic kidney disease, stage 3 unspecified: Secondary | ICD-10-CM

## 2021-09-06 DIAGNOSIS — Z79899 Other long term (current) drug therapy: Secondary | ICD-10-CM

## 2021-09-07 NOTE — Addendum Note (Signed)
Addended by: Britt Bottom on: 09/07/2021 08:47 AM ? ? Modules accepted: Orders ? ?

## 2021-09-12 ENCOUNTER — Encounter: Payer: Self-pay | Admitting: Primary Care

## 2021-09-12 ENCOUNTER — Ambulatory Visit (INDEPENDENT_AMBULATORY_CARE_PROVIDER_SITE_OTHER): Payer: Medicare Other | Admitting: Primary Care

## 2021-09-12 DIAGNOSIS — J9612 Chronic respiratory failure with hypercapnia: Secondary | ICD-10-CM

## 2021-09-12 DIAGNOSIS — G4733 Obstructive sleep apnea (adult) (pediatric): Secondary | ICD-10-CM

## 2021-09-12 DIAGNOSIS — J4550 Severe persistent asthma, uncomplicated: Secondary | ICD-10-CM

## 2021-09-12 DIAGNOSIS — I5032 Chronic diastolic (congestive) heart failure: Secondary | ICD-10-CM | POA: Diagnosis not present

## 2021-09-12 DIAGNOSIS — J9611 Chronic respiratory failure with hypoxia: Secondary | ICD-10-CM

## 2021-09-12 DIAGNOSIS — Z9989 Dependence on other enabling machines and devices: Secondary | ICD-10-CM

## 2021-09-12 MED ORDER — PREDNISONE 10 MG PO TABS
ORAL_TABLET | ORAL | 0 refills | Status: DC
Start: 2021-09-12 — End: 2021-11-22

## 2021-09-12 NOTE — Patient Instructions (Signed)
Recommendations: ?- Make sure to use Pulmicort morning and evening along with Duoneb every 6 hours as needed ?(I would use Pulmicort +duoneb in the morning, then duoneb around lunch, then pulmicort/duoneb at dinner and then duoneb at bedtime) ?- Continue to wear Triology ventilatory at bedtime qand with naps ?- Continue to wear 2L supplemental oxygen with exertion to maintain O2 >88-90%  ?- Continue Torsemide '40mg'$  daily ?- Take prednisone taper as directed for wheezing heard on exam ? ?Rx: ?- Prednisone taper ? ?Follow-up: ?- 2-3 months with Dr. Mortimer Fries ? ?

## 2021-09-12 NOTE — Progress Notes (Signed)
$'@Patient'w$  ID: Samantha Mason, female    DOB: [birth year], 71 y.o.   MRN: [id number]  Chief Complaint  Patient presents with   Follow-up    Recently developed Bronchitis-tx with zpak. C/o SOB with exertion, wheezing, chest congestion and dry cough.     Referring provider: Glean Hess, MD  HPI: 71 year old female, never smoked.  Past medical history significant for obstructive sleep apnea/OHS, moderate persistent asthma, chronic respiratory failure, diastolic heart failure.  Patient of Dr. Patsey Berthold, last seen by pulmonary nurse practitioner in office on 08/02/2021.  Previous [facility] pulmonary encounter: 08/02/2021 Follow up : OSA, O2 RF , D CHF , Post hospital follow up  Patient presents for posthospital follow-up.  Patient was admitted last week with acute on chronic hypercarbic and hypoxic respiratory failure with decompensated diastolic heart failure and asthmatic bronchitic exacerbation.  Patient initially required BiPAP support.  She was unable to tolerate BiPAP and was changed over to CPAP.  She was treated with aggressive diuresis.  Patient had worsening chronic kidney disease.  Her Lasix was stopped at discharge and changed over to [facility].  At discharge creatinine was at 2.61.  Patient was set up for a trilogy noninvasive vent at home.  She was also discharged with physical therapy and Occupational Therapy.  Since discharge patient is feeling some better, gets winded with minimal activity . Very sedentary. Still has leg swelling not as bad as in hospital .  Patient has underlying moderate persistent asthma.  He is currently on pulmicort neb Twice daily  . Says breathing is doing okay , no flare of cough or wheezing .  Takes Duoneb most days 1-2 x a day .  Is on oxygen 2l/m , no increased oxygen demands.  Prior to admission patient was supposed to be on CPAP at bedtime.  Her machine has been broken.  And has not been able to wear this.  Has Restless leg on requip which she thinks is  helping .   Has CKD stage 3 . Referred to Nephrology to follow up after discharge.   Follows with Cardiology for Diastolic CHF .  Has not started on Demadex yet, pharmacy did not have it. Got call from them today to pick up.   Eating well w/ no n/v.d.   Lives at home with son. Drives.   09/12/2021- Interim hx  Patient presents today for 3 to 4-week follow-up.  Last office visit with pulmonary nurse practitioner she was advised to start on Demadex as prescribed after hospitalization. She was referred to nephrology for stage 3a CKD. Maintained on Pulmicort nebulizer twice daily and duoneb q6 hours.   She called our office on 09/04/21 with bronchitis symptoms. At that time she complained of chest tightness, dry cough, wheezing and sob. She was sent in RX Azithromycin. She is feeling better, reports less chest congestion but still getting short winded and tired with exertion. Cough is non-productive at this time. She is receiving physical therapy a home but not able to do much yesterday. O2 was running 94% on RA. She is using duonebs more frequently, every 4-5 hours the last 1-2 weeks but not been taking pulmicort. She wears Trilogy ventilator at bedtime and with naps. She is on continuous oxygen at 2 L/min. She can go without oxygen at times, not currently wearing oxygen during visit today but she has it with her. She is taking torsemide, dose was increased to '40mg'$  daily. She is not able to take estresto d/t rash. Weight is coming  down, following fluid and sodium restriction.     Allergies  Allergen Reactions   Nuvigil [Armodafinil] Hives   Penicillins Diarrhea and Nausea And Vomiting    Did it involve swelling of the face/tongue/throat, SOB, or low BP? no Did it involve sudden or severe rash/hives, skin peeling, or any reaction on the inside of your mouth or nose? No Did you need to seek medical attention at a hospital or doctor's office? No When did it last happen?  in her 36s    If all above  answers are "NO", may proceed with cephalosporin use.    Gabapentin Other (See Comments)    "wiped her out, couldn't stay awake"   Melatonin Other (See Comments)    "Brain fog"   Provigil [Modafinil] Hives   Entresto [Sacubitril-Valsartan] Itching   Lisinopril Cough    Immunization History  Administered Date(s) Administered   Fluad Quad(high Dose 65+) 02/15/2019, 02/16/2020, 02/19/2021   Influenza Split 02/23/2018   Influenza-Unspecified 02/16/2014, 01/18/2017   Moderna Sars-Covid-2 Vaccination 01/05/2020, 02/01/2020   Pneumococcal Conjugate-13 05/16/2014   Pneumococcal Polysaccharide-23 03/12/2017, 07/16/2017    Past Medical History:  Diagnosis Date   Acute bronchitis    Anemia    Arthritis    Asthma    B12 deficiency 03/14/2017   Chest pain 07/02/2018   Chest pain    a. 06/2018 MV: EF 66%. No ischemia/infarct; b. 02/2021 PET CT: EF>60%, no ischemia/infarct.   Chronic heart failure with preserved ejection fraction (HFpEF) ([place])    a. 08/2020 Echo: EF 55-60%, GrI DD; b. 06/2021 Echo: EF 55-60%, mild LVH, nl RV fxn, no signif valvular dzs.   COPD (chronic obstructive pulmonary disease) ([place]) 07/24/2018   Family history of blood clots 07/16/2017   Irritable bowel syndrome    Malignant hypertension    Obesity    OSA (obstructive sleep apnea)    uses cpap   Pneumonia 2017   Restless leg syndrome    Stroke ([place]) 1995   mild    Tobacco History: Social History   Tobacco Use  Smoking Status Never  Smokeless Tobacco Never   Counseling given: Not Answered   Outpatient Medications Prior to Visit  Medication Sig Dispense Refill   acetaminophen (TYLENOL) 500 MG tablet Take 1,000 mg by mouth every 6 (six) hours as needed.     budesonide (PULMICORT) 0.5 MG/2ML nebulizer solution Take 2 mLs (0.5 mg total) by nebulization 2 (two) times daily. 120 mL 2   cetirizine (ZYRTEC) 10 MG tablet Take 1 tablet (10 mg total) by mouth daily. 30 tablet 2   cholestyramine (QUESTRAN) 4 g  packet DISSOLVE AND DRINK 1 PACKET BY MOUTH AT BEDTIME 30 packet 11   diltiazem (CARDIZEM CD) 240 MG 24 hr capsule Take 1 capsule (240 mg total) by mouth daily. 90 capsule 3   empagliflozin (JARDIANCE) 10 MG TABS tablet Take 1 tablet (10 mg total) by mouth daily before breakfast. 30 tablet 5   ipratropium-albuterol (DUONEB) 0.5-2.5 (3) MG/3ML SOLN Take 3 mLs by nebulization every 6 (six) hours as needed. 360 mL 2   NON FORMULARY Bipap - nightly with Oxygen 2 Lpm     nystatin (MYCOSTATIN/NYSTOP) powder Apply topically 3 (three) times daily. (Patient not taking: Reported on 10/08/2021) 15 g 0   OXYGEN Inhale into the lungs. 2 LPM [place]     pantoprazole (PROTONIX) 40 MG tablet TAKE 1 TABLET BY MOUTH EVERY DAY 90 tablet 0   rOPINIRole (REQUIP) 1 MG tablet Take 1 tablet (1 mg  total) by mouth 3 (three) times daily. 90 tablet 1   torsemide (DEMADEX) 20 MG tablet Take 1 tablet (20 mg total) by mouth daily. (Patient taking differently: Take 40 mg by mouth daily.) 30 tablet 0   No facility-administered medications prior to visit.   Review of Systems  Review of Systems  Constitutional: Negative.   HENT:  Positive for congestion.   Respiratory:  Positive for cough and shortness of breath.   Cardiovascular:  Negative for leg swelling.    Physical Exam  BP (!) 134/58 (BP Location: Left Arm, Cuff Size: Large)   Pulse 96   Temp 98 F (36.7 C) (Temporal)   Ht '5\' 4"'$  (1.626 m)   Wt (!) 328 lb 9.6 oz (149.1 kg)   SpO2 94%   BMI 56.40 kg/m  Physical Exam Constitutional:      Appearance: Normal appearance.  HENT:     Head: Normocephalic and atraumatic.  Cardiovascular:     Rate and Rhythm: Normal rate.  Pulmonary:     Effort: Pulmonary effort is normal.     Breath sounds: Wheezing present.  Skin:    General: Skin is warm and dry.  Neurological:     General: No focal deficit present.     Mental Status: She is alert and oriented to person, place, and time. Mental status is at baseline.   Psychiatric:        Mood and Affect: Mood normal.        Behavior: Behavior normal.        Thought Content: Thought content normal.        Judgment: Judgment normal.     Lab Results:  CBC    Component Value Date/Time   WBC 9.9 07/25/2021 0657   RBC 4.23 07/25/2021 0657   HGB 9.6 (L) 07/25/2021 0657   HGB 10.2 (L) 04/20/2014 1610   HCT 32.0 (L) 07/25/2021 0657   HCT 33.3 (L) 04/20/2014 1610   PLT 309 07/25/2021 0657   PLT 267 04/20/2014 1610   MCV 75.7 (L) 07/25/2021 0657   MCV 76 (L) 04/20/2014 1610   MCH 22.7 (L) 07/25/2021 0657   MCHC 30.0 07/25/2021 0657   RDW 17.2 (H) 07/25/2021 0657   RDW 17.4 (H) 04/20/2014 1610   LYMPHSABS 1.5 07/15/2021 0436   LYMPHSABS 1.6 04/20/2014 1610   MONOABS 0.6 07/15/2021 0436   MONOABS 0.4 04/20/2014 1610   EOSABS 0.5 07/15/2021 0436   EOSABS 0.2 04/20/2014 1610   BASOSABS 0.1 07/15/2021 0436   BASOSABS 0.0 04/20/2014 1610    BMET    Component Value Date/Time   NA 137 10/08/2021 1403   NA 134 (A) 09/14/2021 0000   NA 141 05/25/2014 1407   K 3.8 10/08/2021 1403   K 3.6 05/25/2014 1407   CL 95 (L) 10/08/2021 1403   CL 106 05/25/2014 1407   CO2 31 10/08/2021 1403   CO2 28 05/25/2014 1407   GLUCOSE 108 (H) 10/08/2021 1403   GLUCOSE 86 05/25/2014 1407   BUN 39 (H) 10/08/2021 1403   BUN 46 (A) 09/14/2021 0000   BUN 15 05/25/2014 1407   CREATININE 2.12 (H) 10/08/2021 1403   CREATININE 1.24 05/25/2014 1407   CALCIUM 9.5 10/08/2021 1403   CALCIUM 8.6 05/25/2014 1407   GFRNONAA 25 (L) 10/08/2021 1403   GFRNONAA 47 (L) 05/25/2014 1407   GFRNONAA 47 (L) 09/26/2013 0325   GFRAA 52 (L) 02/16/2020 1154   GFRAA 56 (L) 05/25/2014 1407   GFRAA 54 (L) 09/26/2013  0325    BNP    Component Value Date/Time   BNP 103.2 (H) 07/15/2021 0436    ProBNP No results found for: PROBNP  Imaging: No results found.   Assessment & Plan:   Severe persistent asthma - Treated for bronchitis symptoms on 09/04/21 with Zpack. She reports  improvement in chest congestion. Still having dyspnea symptoms. Wheezing heard on exam. Sending in prednisone taper '40mg'$  x 2 days, '30mg'$  x 2 days, '20mg'$  x 2 days, '10mg'$  x 2 days. Encourage patient use Pulmicort morning and evening along with Duoneb every 6 hours as needed for sob/wheezing.   OSA  - Continue to wear Triology ventilatory at bedtime and with naps  Chronic respiratory failure with hypoxia and hypercapnia (HCC) - Continue to wear 2L supplemental oxygen with exertion to maintain O2 >88-90%   Chronic heart failure with preserved ejection fraction (HFpEF) (HCC) - No signs of fluid overload on exam - Continue Torsemide '40mg'$  daily   Martyn Ehrich, NP 10/15/2021

## 2021-09-14 ENCOUNTER — Other Ambulatory Visit: Payer: Self-pay | Admitting: Internal Medicine

## 2021-09-14 LAB — BASIC METABOLIC PANEL
BUN: 46 — AB (ref 4–21)
CO2: 24 — AB (ref 13–22)
Chloride: 96 — AB (ref 99–108)
Creatinine: 1.9 — AB (ref 0.5–1.1)
Glucose: 106
Potassium: 4.1 mEq/L (ref 3.5–5.1)
Sodium: 134 — AB (ref 137–147)

## 2021-09-14 LAB — COMPREHENSIVE METABOLIC PANEL
Calcium: 9.6 (ref 8.7–10.7)
eGFR: 29

## 2021-09-19 ENCOUNTER — Other Ambulatory Visit: Payer: Self-pay | Admitting: Family

## 2021-09-19 MED ORDER — TORSEMIDE 20 MG PO TABS
ORAL_TABLET | ORAL | 0 refills | Status: DC
Start: 2021-09-19 — End: 2021-10-08

## 2021-09-28 ENCOUNTER — Telehealth: Payer: Self-pay | Admitting: Internal Medicine

## 2021-09-28 NOTE — Telephone Encounter (Signed)
Copied from [place] [id number]. Topic: Quick Communication - Home Health Verbal Orders ?>> Sep 28, 2021  9:22 AM Tessa Lerner A wrote: ?Caller/Agency: Jenny Reichmann / [facility]  ?Callback Number: [id number] ?Requesting OT/PT/Skilled Nursing/Social Work/Speech Therapy: PT ?Frequency: 1w8 ?

## 2021-09-28 NOTE — Telephone Encounter (Signed)
Called Cindy left VM with verbal orders. Name stated on VM. ? ?KP ?

## 2021-10-07 NOTE — Progress Notes (Signed)
Name: Samantha Mason  DOB: July 24, 2050  MRN: [id number]  Samantha Mason is a 71 y.o. F with diastolic CHF, and COPD with chronic respiratory failure on 2 L nasal cannula as well as obstructive sleep apnea on CPAP, stage IIIb chronic kidney disease, hypertension and morbid obesity.   Admitted 2/26-3/10/23 for acute on chronic respiratory failure and HF exacerbation. Diuresed and eventually discharged back home.   Echo from 07/16/21 reviewed and showed EF 55-60% with mild LVH.   She presents for a follow-up visit with a chief complaint of moderate shortness of breath with little exertion. She says that this has been chronic in nature and she feels like it has been worsening some lately. Now taking torsemide '40mg'$  daily although 2 days ago, doubled her torsemide due to overnight weight gain of 3 pounds. Has associated fatigue, chest tightness, pedal edema, dizziness (with too much exertion) and difficulty sleeping (unable to tolerate melatonin). She denies any wheezing, cough, chest pain or palpitations.   She says that over the last 2 weeks, her weight has gone from 322 pounds to 329 pounds. Wears her oxygen at 2L at bedtime only. Has been unable to tolerate trilogy vent but she's also unable to sleep well. Tried melatonin and developed "brain fog" with it.   Participating in [facility] home HF program. HR at home ranging from 80-90's.   Past Medical History:  Diagnosis Date   Acute bronchitis    Anemia    Arthritis    Asthma    B12 deficiency 03/14/2017   Chest pain 07/02/2018   Chest pain    a. 06/2018 MV: EF 66%. No ischemia/infarct; b. 02/2021 PET CT: EF>60%, no ischemia/infarct.   Chronic heart failure with preserved ejection fraction (HFpEF) ([place])    a. 08/2020 Echo: EF 55-60%, GrI DD; b. 06/2021 Echo: EF 55-60%, mild LVH, nl RV fxn, no signif valvular dzs.   COPD (chronic obstructive pulmonary disease) ([place]) 07/24/2018   Family history of blood clots 07/16/2017   Irritable bowel syndrome     Malignant hypertension    Obesity    OSA (obstructive sleep apnea)    uses cpap   Pneumonia 2017   Restless leg syndrome    Stroke [facility] 1995   mild   Past Surgical History:  Procedure Laterality Date   CHOLECYSTECTOMY     COLONOSCOPY WITH PROPOFOL N/A 03/31/2018   Procedure: COLONOSCOPY WITH PROPOFOL;  Surgeon: Lucilla Lame, MD;  Location: [facility] ENDOSCOPY;  Service: Endoscopy;  Laterality: N/A;   KNEE ARTHROPLASTY Right 04/30/2019   Procedure: COMPUTER ASSISTED TOTAL KNEE ARTHROPLASTY;  Surgeon: Dereck Leep, MD;  Location: [facility] ORS;  Service: Orthopedics;  Laterality: Right;   saliva gland removal  1998   TOTAL KNEE ARTHROPLASTY Left 2014   TUBAL LIGATION Bilateral    Family History  Problem Relation Age of Onset   Dementia Mother    COPD Father        48   Diabetes Father    Heart failure Brother    Coronary artery disease Brother 31       CABG   Heart failure Maternal Grandmother    Stroke Maternal Grandmother    Breast cancer Neg Hx    Social History   Tobacco Use   Smoking status: Never   Smokeless tobacco: Never  Substance Use Topics   Alcohol use: No    Alcohol/week: 0.0 standard drinks   Allergies  Allergen Reactions   Nuvigil [Armodafinil] Hives   Penicillins Diarrhea and Nausea And  Vomiting    Did it involve swelling of the face/tongue/throat, SOB, or low BP? no Did it involve sudden or severe rash/hives, skin peeling, or any reaction on the inside of your mouth or nose? No Did you need to seek medical attention at a hospital or doctor's office? No When did it last happen?  in her 69s    If all above answers are "NO", may proceed with cephalosporin use.    Gabapentin Other (See Comments)    "wiped her out, couldn't stay awake"   Provigil [Modafinil] Hives   Entresto [Sacubitril-Valsartan] Itching   Lisinopril Cough   Prior to Admission medications   Medication Sig Start Date End Date Taking? Authorizing Provider  acetaminophen (TYLENOL) 500  MG tablet Take 1,000 mg by mouth every 6 (six) hours as needed.   Yes [provider]  budesonide (PULMICORT) 0.5 MG/2ML nebulizer solution Take 2 mLs (0.5 mg total) by nebulization 2 (two) times daily. 06/26/21 06/26/22 Yes Tyler Pita, MD  cetirizine (ZYRTEC) 10 MG tablet Take 1 tablet (10 mg total) by mouth daily. 12/07/14  Yes Ashok Norris, MD  cholestyramine (QUESTRAN) 4 g packet DISSOLVE AND DRINK 1 PACKET BY MOUTH AT BEDTIME 03/14/21  Yes Glean Hess, MD  diltiazem (CARDIZEM CD) 240 MG 24 hr capsule Take 1 capsule (240 mg total) by mouth daily. 09/05/21 12/04/21 Yes Theora Gianotti, NP  empagliflozin (JARDIANCE) 10 MG TABS tablet Take 1 tablet (10 mg total) by mouth daily before breakfast. 08/07/21  Yes Krystie Leiter A, FNP  ipratropium-albuterol (DUONEB) 0.5-2.5 (3) MG/3ML SOLN Take 3 mLs by nebulization every 6 (six) hours as needed. 06/26/21  Yes Tyler Pita, MD  NON FORMULARY Bipap - nightly with Oxygen 2 Lpm   Yes [provider]  OXYGEN Inhale into the lungs. 2 LPM    Yes [provider]  pantoprazole (PROTONIX) 40 MG tablet TAKE 1 TABLET BY MOUTH EVERY DAY 08/15/21  Yes Glean Hess, MD  rOPINIRole (REQUIP) 1 MG tablet Take 1 tablet (1 mg total) by mouth 3 (three) times daily. 07/27/21  Yes Annita Brod, MD  torsemide (DEMADEX) 20 MG tablet Take 2 tablets (40 mg total) by mouth daily. 10/08/21  Yes Daquavion Catala, Otila Kluver A, FNP  nystatin (MYCOSTATIN/NYSTOP) powder Apply topically 3 (three) times daily. Patient not taking: Reported on 10/08/2021 07/27/21   Annita Brod, MD  predniSONE (DELTASONE) 10 MG tablet 4 tabs for 2 days, then 3 tabs for 2 days, 2 tabs for 2 days, then 1 tab for 2 days, then stop Patient not taking: Reported on 10/08/2021 09/12/21   Martyn Ehrich, NP     Review of Systems  Constitutional:  Positive for malaise/fatigue.  HENT: Negative.    Eyes:  Negative for blurred vision and double vision.   Respiratory:  Positive for shortness of breath (worsening). Negative for cough, hemoptysis and wheezing.   Cardiovascular:  Positive for leg swelling. Negative for chest pain, palpitations and orthopnea.       Chest tightness  Gastrointestinal: Negative.   Genitourinary: Negative.   Musculoskeletal:  Positive for neck pain.  Skin: Negative.   Neurological:  Positive for dizziness (when working with PT). Negative for weakness.  Endo/Heme/Allergies: Negative.   Psychiatric/Behavioral:  Negative for depression. The patient has insomnia (has trilogy vent, has not used through the night yet).     Vitals:   10/08/21 1335  BP: (!) 162/67  Pulse: (!) 114  Resp: 16  SpO2: 95%  Weight: (!) 333 lb (151 kg)  Height: '5\' 4"'$  (1.626 m)   Wt Readings from Last 3 Encounters:  10/08/21 (!) 333 lb (151 kg)  09/12/21 (!) 328 lb 9.6 oz (149.1 kg)  09/05/21 (!) 334 lb 8 oz (151.7 kg)   Lab Results  Component Value Date   CREATININE 1.9 (A) 09/14/2021   CREATININE 1.76 (H) 09/05/2021   CREATININE 1.45 (H) 08/06/2021    Physical Exam Vitals and nursing note reviewed. Exam conducted with a chaperone present (son).  Constitutional:      General: She is not in acute distress.    Appearance: She is obese. She is not toxic-appearing.  Cardiovascular:     Rate and Rhythm: Regular rhythm. Tachycardia present.     Heart sounds: No murmur heard. Pulmonary:     Effort: No tachypnea.     Breath sounds: No decreased breath sounds, wheezing, rhonchi or rales.  Abdominal:     Palpations: Abdomen is soft.  Musculoskeletal:     Right lower leg: Edema (+1) present.     Left lower leg: Edema (+1) present.  Skin:    General: Skin is warm and dry.     Capillary Refill: Capillary refill takes less than 2 seconds.  Neurological:     General: No focal deficit present.     Mental Status: She is alert and oriented to person, place, and time.  Psychiatric:        Mood and Affect: Mood normal.    Assessment  and Plan:  Heart failure with preserved ejection fraction and structural changes- - NYHA III - euvolemic today - weighing daily, reviewed when to call to report weight gain - weight down 4 pounds from last visit here 2 months ago; has recently had daily torsemide  increased to '40mg'$  daily by [facility] provider; she did double that over the weekend due to overnight weight gain of 3 pounds - not adding salt and is reading food labels for sodium content and serving size - already adhering to fluid restriction ~ 64 ounces/day - on torsemide '40mg'$  daily - was on lisinopril but stopped d/t cough & entresto stopped due to itching - on GDMT of empagliflozin - will check labs today per ventricle HF program - encouraged her to wear compression socks each day  - tachycardic today but home HR in the 80-90's - BNP 07/15/21 103.2 - saw cardiology Sharolyn Douglas) on 09/05/21  2. HTN with CKD- - BP elevated (162/67); doing daily BP checks through monitoring program - saw PCP Army Melia) 08/27/21 - BMP 09/14/21 reviewed and showed Na 134, K 4.1, Cr 1.9, GFR 29 - saw nephrology Candiss Norse) 08/09/21  3. OSA/COPD- - wears O2 2 lpm at bedtime - saw pulmonology Volanda Napoleon) 09/12/21 - has trilogy vent that she is wearing intermittently throughout the day; has been unable to sleep at night so hasn't gotten used to wearing it all night   Patient did not bring her medications nor a list. Each medication was verbally reviewed with the patient and she was encouraged to bring the bottles to every visit to confirm accuracy of list.    Return in 3 months, sooner if needed.

## 2021-10-08 ENCOUNTER — Other Ambulatory Visit: Payer: Self-pay | Admitting: Family

## 2021-10-08 ENCOUNTER — Encounter: Payer: Self-pay | Admitting: Family

## 2021-10-08 ENCOUNTER — Ambulatory Visit: Payer: Medicare Other | Attending: Family | Admitting: Family

## 2021-10-08 ENCOUNTER — Other Ambulatory Visit
Admission: RE | Admit: 2021-10-08 | Discharge: 2021-10-08 | Disposition: A | Discharge status: Home / Self Care | Payer: Medicare Other | Source: Ambulatory Visit | Attending: Family | Admitting: Family

## 2021-10-08 VITALS — BP 162/67 | HR 102 | Resp 16 | Ht 64.0 in | Wt 333.0 lb

## 2021-10-08 DIAGNOSIS — G4733 Obstructive sleep apnea (adult) (pediatric): Secondary | ICD-10-CM

## 2021-10-08 DIAGNOSIS — Z79899 Other long term (current) drug therapy: Secondary | ICD-10-CM | POA: Diagnosis not present

## 2021-10-08 DIAGNOSIS — J449 Chronic obstructive pulmonary disease, unspecified: Secondary | ICD-10-CM | POA: Diagnosis not present

## 2021-10-08 DIAGNOSIS — I130 Hypertensive heart and chronic kidney disease with heart failure and stage 1 through stage 4 chronic kidney disease, or unspecified chronic kidney disease: Secondary | ICD-10-CM | POA: Insufficient documentation

## 2021-10-08 DIAGNOSIS — I5032 Chronic diastolic (congestive) heart failure: Secondary | ICD-10-CM | POA: Diagnosis not present

## 2021-10-08 DIAGNOSIS — N1832 Chronic kidney disease, stage 3b: Secondary | ICD-10-CM | POA: Diagnosis not present

## 2021-10-08 DIAGNOSIS — R000 Tachycardia, unspecified: Secondary | ICD-10-CM | POA: Diagnosis not present

## 2021-10-08 DIAGNOSIS — Z9981 Dependence on supplemental oxygen: Secondary | ICD-10-CM | POA: Diagnosis not present

## 2021-10-08 DIAGNOSIS — J9610 Chronic respiratory failure, unspecified whether with hypoxia or hypercapnia: Secondary | ICD-10-CM | POA: Insufficient documentation

## 2021-10-08 DIAGNOSIS — I10 Essential (primary) hypertension: Secondary | ICD-10-CM

## 2021-10-08 LAB — BASIC METABOLIC PANEL
Anion gap: 11 (ref 5–15)
BUN: 39 mg/dL — ABNORMAL HIGH (ref 8–23)
CO2: 31 mmol/L (ref 22–32)
Calcium: 9.5 mg/dL (ref 8.9–10.3)
Chloride: 95 mmol/L — ABNORMAL LOW (ref 98–111)
Creatinine, Ser: 2.12 mg/dL — ABNORMAL HIGH (ref 0.44–1.00)
GFR, Estimated: 25 mL/min — ABNORMAL LOW (ref >60)
Glucose, Bld: 108 mg/dL — ABNORMAL HIGH (ref 70–99)
Potassium: 3.8 mmol/L (ref 3.5–5.1)
Sodium: 137 mmol/L (ref 135–145)

## 2021-10-08 MED ORDER — TORSEMIDE 20 MG PO TABS: 40.0000 mg | ORAL_TABLET | Freq: Every day | ORAL | 0 refills | Status: DC

## 2021-10-08 NOTE — Progress Notes (Signed)
Torsemide dosage updated per Ventric[facility] provider

## 2021-10-08 NOTE — Patient Instructions (Signed)
Continue weighing daily and call for an overnight weight gain of 3 pounds or more or a weekly weight gain of more than 5 pounds.   If you have voicemail, please make sure your mailbox is cleaned out so that we may leave a message and please make sure to listen to any voicemails.     

## 2021-10-11 ENCOUNTER — Telehealth: Payer: Self-pay | Admitting: Family

## 2021-10-11 NOTE — Telephone Encounter (Signed)
Received fax from [place] provider from their video visit with patient yesterday:  Started metolazone '5mg'$  daily for 3 days beginning on 10/09/21  Also took extra '20mg'$  torsemide yesterday, '40mg'$  BID today and then starting tomorrow, go back to '40mg'$  daily  To have BMP/BNP drawn tomorrow.

## 2021-10-14 ENCOUNTER — Other Ambulatory Visit: Payer: Self-pay | Admitting: Internal Medicine

## 2021-10-15 DIAGNOSIS — J9611 Chronic respiratory failure with hypoxia: Secondary | ICD-10-CM | POA: Insufficient documentation

## 2021-10-15 NOTE — Assessment & Plan Note (Addendum)
-   Treated for bronchitis symptoms on 09/04/21 with Zpack. She reports improvement in chest congestion. Still having dyspnea symptoms. Wheezing heard on exam. Sending in prednisone taper '40mg'$  x 2 days, '30mg'$  x 2 days, '20mg'$  x 2 days, '10mg'$  x 2 days. Encourage patient use Pulmicort morning and evening along with Duoneb every 6 hours as needed for sob/wheezing.

## 2021-10-15 NOTE — Assessment & Plan Note (Signed)
-   Continue to wear 2L supplemental oxygen with exertion to maintain O2 >88-90%

## 2021-10-15 NOTE — Assessment & Plan Note (Signed)
-   Continue to wear Triology ventilatory at bedtime and with naps

## 2021-10-15 NOTE — Assessment & Plan Note (Signed)
-   No signs of fluid overload on exam - Continue Torsemide '40mg'$  daily

## 2021-10-16 NOTE — Telephone Encounter (Signed)
Requested medication (s) are due for refill today: yes  Requested medication (s) are on the active medication list: yes  Last refill:  07/26/21  Future visit scheduled: yes  Notes to clinic:  Unable to refill per protocol, cannot delegate, rx was dc on 07/27/21 by another provider. Please assess    Requested Prescriptions  Pending Prescriptions Disp Refills   promethazine (PHENERGAN) 25 MG tablet [Pharmacy Med Name: PROMETHAZINE 25 MG TABLET] 30 tablet 0    Sig: TAKE 1 TABLET BY MOUTH EVERY 6 HOURS AS NEEDED.     Not Delegated - Gastroenterology: Antiemetics Failed - 10/14/2021  1:10 PM      Failed - This refill cannot be delegated      Passed - Valid encounter within last 6 months    Recent Outpatient Visits           1 month ago Chronic heart failure with preserved ejection fraction (HFpEF) [facility])   [facility] Glean Hess, MD   7 months ago Essential hypertension   [facility] Glean Hess, MD   1 year ago Asthma in adult, moderate persistent, with acute exacerbation   [facility] Glean Hess, MD   1 year ago Annual physical exam   [facility] Glean Hess, MD   2 years ago Annual physical exam   [facility] Glean Hess, MD       Future Appointments             In 3 weeks Theora Gianotti, NP [facility], [facility]   In 4 months Army Melia, Jesse Sans, MD [facility], [facility]

## 2021-10-18 ENCOUNTER — Other Ambulatory Visit (INDEPENDENT_AMBULATORY_CARE_PROVIDER_SITE_OTHER): Payer: Medicare Other | Admitting: Internal Medicine

## 2021-10-18 ENCOUNTER — Other Ambulatory Visit: Payer: Self-pay

## 2021-10-18 DIAGNOSIS — J441 Chronic obstructive pulmonary disease with (acute) exacerbation: Secondary | ICD-10-CM | POA: Diagnosis not present

## 2021-10-18 DIAGNOSIS — J9622 Acute and chronic respiratory failure with hypercapnia: Secondary | ICD-10-CM

## 2021-10-18 DIAGNOSIS — I10 Essential (primary) hypertension: Secondary | ICD-10-CM

## 2021-10-18 DIAGNOSIS — I252 Old myocardial infarction: Secondary | ICD-10-CM

## 2021-10-18 DIAGNOSIS — K219 Gastro-esophageal reflux disease without esophagitis: Secondary | ICD-10-CM

## 2021-10-18 DIAGNOSIS — F5101 Primary insomnia: Secondary | ICD-10-CM

## 2021-10-18 DIAGNOSIS — K580 Irritable bowel syndrome with diarrhea: Secondary | ICD-10-CM

## 2021-10-18 DIAGNOSIS — J9621 Acute and chronic respiratory failure with hypoxia: Secondary | ICD-10-CM

## 2021-10-18 DIAGNOSIS — G4733 Obstructive sleep apnea (adult) (pediatric): Secondary | ICD-10-CM

## 2021-10-18 DIAGNOSIS — I5033 Acute on chronic diastolic (congestive) heart failure: Secondary | ICD-10-CM

## 2021-10-18 DIAGNOSIS — E871 Hypo-osmolality and hyponatremia: Secondary | ICD-10-CM

## 2021-10-18 DIAGNOSIS — G2581 Restless legs syndrome: Secondary | ICD-10-CM

## 2021-10-18 DIAGNOSIS — N1832 Chronic kidney disease, stage 3b: Secondary | ICD-10-CM

## 2021-10-18 DIAGNOSIS — I5032 Chronic diastolic (congestive) heart failure: Secondary | ICD-10-CM

## 2021-10-18 DIAGNOSIS — I130 Hypertensive heart and chronic kidney disease with heart failure and stage 1 through stage 4 chronic kidney disease, or unspecified chronic kidney disease: Secondary | ICD-10-CM

## 2021-10-18 MED ORDER — PROMETHAZINE HCL 25 MG PO TABS: ORAL_TABLET | Freq: Four times a day (QID) | ORAL | 0 refills | Status: DC | PRN

## 2021-10-18 NOTE — Progress Notes (Signed)
Received home health orders orders from [facility]. Start of care 07/31/21.   Certification and orders from 09/29/21 through 11/27/21 are reviewed, signed and faxed back to home health company.  Need of intermittent skilled services at home: COPD  The home health care plan has been established by me and will be reviewed and updated as needed to maximize patient recovery.  I certify that all home health services have been and will be furnished to the patient while under my care.  Face-to-face encounter in which the need for home health services was established: 08/28/21.  Patient is receiving home health services for the following diagnoses: Problem List Items Addressed This Visit   None    Halina Maidens, MD

## 2021-10-18 NOTE — Telephone Encounter (Signed)
Pt. Calling about refill for Phenergan. States she uses it as needed and does not remember her PCP discontinuing the medication. Please advise pt.

## 2021-10-18 NOTE — Telephone Encounter (Signed)
Refilled medication. The refusal was an error and patient has been taking this for years as needed.  - Samantha Mason

## 2021-10-24 ENCOUNTER — Other Ambulatory Visit: Payer: Self-pay | Admitting: Internal Medicine

## 2021-10-24 MED ORDER — ROPINIROLE HCL 1 MG PO TABS: 1.0000 mg | ORAL_TABLET | Freq: Three times a day (TID) | ORAL | 2 refills | Status: DC

## 2021-10-24 NOTE — Telephone Encounter (Signed)
Copied from [place] [id number]. Topic: General - Other >> Oct 24, 2021  2:31 PM Tessa Lerner A wrote: Reason for CRM: Medication Refill - Medication: rOPINIRole (REQUIP) 1 MG tablet [[id number]]   Has the patient contacted their pharmacy? Yes.  The patient was directed to contact their PCP (Agent: If no, request that the patient contact the pharmacy for the refill. If patient does not wish to contact the pharmacy document the reason why and proceed with request.) (Agent: If yes, when and what did the pharmacy advise?)  Preferred Pharmacy (with phone number or street name): [id number] [place]Phone: [phone]: [phone]: Not open 24 hours   Has the patient been seen for an appointment in the last year OR does the patient have an upcoming appointment? Yes.    Agent: Please be advised that RX refills may take up to 3 business days. We ask that you follow-up with your pharmacy.

## 2021-10-24 NOTE — Telephone Encounter (Signed)
Requested Prescriptions  Pending Prescriptions Disp Refills  . rOPINIRole (REQUIP) 1 MG tablet 180 tablet 2    Sig: Take 1 tablet (1 mg total) by mouth 3 (three) times daily.     Neurology:  Parkinsonian Agents Failed - 10/24/2021  3:49 PM      Failed - Last BP in normal range    BP Readings from Last 1 Encounters:  10/08/21 (!) 162/67         Passed - Last Heart Rate in normal range    Pulse Readings from Last 1 Encounters:  10/08/21 (!) 102         Passed - Valid encounter within last 12 months    Recent Outpatient Visits          1 month ago Chronic heart failure with preserved ejection fraction (HFpEF) [facility])   [facility] Glean Hess, MD   8 months ago Essential hypertension   [facility] Glean Hess, MD   1 year ago Asthma in adult, moderate persistent, with acute exacerbation   [facility] Glean Hess, MD   1 year ago Annual physical exam   [facility] Glean Hess, MD   2 years ago Annual physical exam   [facility] Glean Hess, MD      Future Appointments            In 1 week Sharolyn Douglas Clance Boll, NP [facility], [facility]   In 4 months Army Melia, Jesse Sans, MD [facility], [facility]

## 2021-10-25 LAB — COMPREHENSIVE METABOLIC PANEL WITH GFR: eGFR: 32

## 2021-10-25 LAB — BASIC METABOLIC PANEL
BUN: 36 — AB (ref 4–21)
CO2: 29 — AB (ref 13–22)
Chloride: 87 — AB (ref 99–108)
Creatinine: 1.7 — AB (ref 0.5–1.1)
Glucose: 95
Potassium: 4.6 mEq/L (ref 3.5–5.1)
Sodium: 136 — AB (ref 137–147)

## 2021-10-27 ENCOUNTER — Other Ambulatory Visit: Payer: Self-pay | Admitting: Pulmonary Disease

## 2021-11-06 ENCOUNTER — Ambulatory Visit: Payer: Medicare Other | Admitting: Nurse Practitioner

## 2021-11-12 ENCOUNTER — Other Ambulatory Visit: Payer: Self-pay | Admitting: Family

## 2021-11-12 ENCOUNTER — Ambulatory Visit (INDEPENDENT_AMBULATORY_CARE_PROVIDER_SITE_OTHER): Payer: Medicare Other | Admitting: Medical

## 2021-11-12 ENCOUNTER — Encounter: Payer: Self-pay | Admitting: Medical

## 2021-11-12 ENCOUNTER — Other Ambulatory Visit
Admission: RE | Admit: 2021-11-12 | Discharge: 2021-11-12 | Disposition: A | Discharge status: Home / Self Care | Payer: Medicare Other | Source: Ambulatory Visit | Attending: Nurse Practitioner | Admitting: Nurse Practitioner

## 2021-11-12 VITALS — BP 118/52 | HR 110 | Ht 64.0 in | Wt 324.4 lb

## 2021-11-12 DIAGNOSIS — I5032 Chronic diastolic (congestive) heart failure: Secondary | ICD-10-CM | POA: Insufficient documentation

## 2021-11-12 DIAGNOSIS — R000 Tachycardia, unspecified: Secondary | ICD-10-CM | POA: Diagnosis not present

## 2021-11-12 DIAGNOSIS — Z79899 Other long term (current) drug therapy: Secondary | ICD-10-CM

## 2021-11-12 DIAGNOSIS — N183 Chronic kidney disease, stage 3 unspecified: Secondary | ICD-10-CM | POA: Diagnosis not present

## 2021-11-12 DIAGNOSIS — I509 Heart failure, unspecified: Secondary | ICD-10-CM

## 2021-11-12 DIAGNOSIS — I10 Essential (primary) hypertension: Secondary | ICD-10-CM

## 2021-11-12 LAB — BASIC METABOLIC PANEL
Anion gap: 16 — ABNORMAL HIGH (ref 5–15)
BUN: 71 mg/dL — ABNORMAL HIGH (ref 8–23)
CO2: 35 mmol/L — ABNORMAL HIGH (ref 22–32)
Calcium: 10.1 mg/dL (ref 8.9–10.3)
Chloride: 80 mmol/L — ABNORMAL LOW (ref 98–111)
Creatinine, Ser: 2.5 mg/dL — ABNORMAL HIGH (ref 0.44–1.00)
GFR, Estimated: 20 mL/min — ABNORMAL LOW (ref >60)
Glucose, Bld: 137 mg/dL — ABNORMAL HIGH (ref 70–99)
Potassium: 3.5 mmol/L (ref 3.5–5.1)
Sodium: 131 mmol/L — ABNORMAL LOW (ref 135–145)

## 2021-11-12 LAB — BRAIN NATRIURETIC PEPTIDE: B Natriuretic Peptide: 16.2 pg/mL (ref 0.0–100.0)

## 2021-11-12 MED ORDER — TORSEMIDE 20 MG PO TABS: 40.0000 mg | ORAL_TABLET | Freq: Two times a day (BID) | ORAL | 0 refills | Status: DC

## 2021-11-12 NOTE — Progress Notes (Signed)
Torsemide frequency changed by [facility] provider

## 2021-11-14 ENCOUNTER — Telehealth: Payer: Self-pay

## 2021-11-14 NOTE — Telephone Encounter (Signed)
Patient made aware of lab results. Patients sts that her torsemide is being managed by [facility] CHF Dr. Beatrice Lecher. She was contacted by him yesterday and instructed to reduce her torsemide to 40 mg daily with plans for her to have a repeat bmp on 11/19/21. Patient prefers to defer to [facility] for management of her diuretic. Adv the patient that I will update Cadence Sheffield, [place]

## 2021-11-14 NOTE — Telephone Encounter (Signed)
-----   Message from [place], PA-C sent at 11/14/2021  7:38 AM EDT ----- Labs showed dehydration on torsemide. Lets hold torsemide for 3 days and then re-start torsemide '40mg'$  daily. BMET in 1 week.

## 2021-11-19 ENCOUNTER — Encounter: Payer: Self-pay | Admitting: Medical

## 2021-11-19 ENCOUNTER — Other Ambulatory Visit: Payer: Self-pay | Admitting: Pulmonary Disease

## 2021-11-22 ENCOUNTER — Encounter: Payer: Self-pay | Admitting: Pulmonary Disease

## 2021-11-22 ENCOUNTER — Ambulatory Visit (INDEPENDENT_AMBULATORY_CARE_PROVIDER_SITE_OTHER): Payer: Medicare Other | Admitting: Pulmonary Disease

## 2021-11-22 VITALS — BP 126/76 | HR 100 | Temp 98.0°F | Ht 64.0 in | Wt 333.4 lb

## 2021-11-22 DIAGNOSIS — I5032 Chronic diastolic (congestive) heart failure: Secondary | ICD-10-CM

## 2021-11-22 DIAGNOSIS — J4540 Moderate persistent asthma, uncomplicated: Secondary | ICD-10-CM | POA: Diagnosis not present

## 2021-11-22 DIAGNOSIS — G4733 Obstructive sleep apnea (adult) (pediatric): Secondary | ICD-10-CM

## 2021-11-22 DIAGNOSIS — Z9989 Dependence on other enabling machines and devices: Secondary | ICD-10-CM

## 2021-11-22 DIAGNOSIS — R0602 Shortness of breath: Secondary | ICD-10-CM | POA: Diagnosis not present

## 2021-11-22 DIAGNOSIS — E662 Morbid (severe) obesity with alveolar hypoventilation: Secondary | ICD-10-CM

## 2021-11-22 DIAGNOSIS — Z6841 Body Mass Index (BMI) 40.0 and over, adult: Secondary | ICD-10-CM

## 2021-11-22 NOTE — Progress Notes (Signed)
Subjective:    Patient ID: Samantha Mason, female    DOB: [birth year], 71 y.o.   MRN: [id number] Patient Care Team: Glean Hess, MD as PCP - General (Internal Medicine) End, Harrell Gave, MD as PCP - Cardiology (Cardiology) Flora Lipps, MD as Consulting Physician (Pulmonary Disease) Murlean Iba, MD (Nephrology)  Chief Complaint  Patient presents with   Follow-up    SOB with exertion, dry cough and chest tightness.     HPI Patient is a 71 year old lifelong never smoker, with morbid obesity and obstructive sleep apnea on Trilogy vent, patient of Dr. Patricia Pesa who presents today for follow-up on the issue of dyspnea.  I saw this patient first as an acute visit on 05 February 2021.  She last saw Dr. Mortimer Fries on 28 November 2020.  Her last visit here was on 12 September 2021 with Geraldo Pitter, NP.  She presents today for follow-up on issues with dyspnea on exertion, dry cough and chest tightness.  The main driver of her dyspnea is multifactorial: Obesity with alveolar hypoventilation, chronic diastolic heart failure, chronic hypoxic and hypercarbic respiratory failure and chronic asthmatic bronchitis.  She uses DuoNeb and Pulmicort erratically, no consistent use.  She is on a Trilogy vent due to her chronic respiratory failure with hypercapnia.  She cites variable compliance with the machine.  I do not have a download on her compliance.  She has not had a reassessment of her lung function since 2021.  She does not endorse any recent fevers, chills or sweats.  She has not had any hemoptysis.  Has chronic orthopnea.  No calf tenderness.  Has chronic lower extremity edema.  Review of Systems A 10 point review of systems was performed and it is as noted above otherwise negative.  Patient Active Problem List   Diagnosis Date Noted   Chronic respiratory failure with hypoxia and hypercapnia ([place]) 10/15/2021   Chronic heart failure with preserved ejection fraction (HFpEF) ([place]) 08/27/2021    Hyponatremia 07/26/2021   Insomnia 07/22/2021   Stage 3a chronic kidney disease (CKD) ([place]) 07/15/2021   Morbid obesity ([place]) 07/15/2021   Myocardial injury 07/15/2021   History of stroke 11/17/2020   Ovarian failure 02/21/2020   Primary osteoarthritis of right knee 02/13/2019   S/P total knee arthroplasty, left 02/13/2019   Aortic atherosclerosis ([place]) 08/01/2018   Obstructive lung disease (generalized) ([place]) 07/24/2018   B12 deficiency anemia 07/07/2018   Benign neoplasm of ascending colon    Severe persistent asthma 02/13/2018   Vitamin D deficiency 03/17/2017   Prediabetes 03/14/2017   Microcytic anemia 03/14/2017   Irritable bowel syndrome with diarrhea 03/12/2017   Allergic rhinitis 03/12/2017   Restless leg syndrome 03/12/2017   GERD (gastroesophageal reflux disease) 10/16/2016   Essential hypertension 10/16/2016   Morbid obesity with BMI of 50.0-59.9, adult ([place]) 12/08/2014   OSA  12/08/2014   Shortness of breath 04/07/2014   Social History   Tobacco Use   Smoking status: Never   Smokeless tobacco: Never  Substance Use Topics   Alcohol use: No    Alcohol/week: 0.0 standard drinks of alcohol   Allergies  Allergen Reactions   Nuvigil [Armodafinil] Hives   Penicillins Diarrhea and Nausea And Vomiting    Did it involve swelling of the face/tongue/throat, SOB, or low BP? no Did it involve sudden or severe rash/hives, skin peeling, or any reaction on the inside of your mouth or nose? No Did you need to seek medical attention at a hospital or doctor's office? No  When did it last happen?  in her 23s    If all above answers are "NO", may proceed with cephalosporin use.    Gabapentin Other (See Comments)    "wiped her out, couldn't stay awake"   Melatonin Other (See Comments)    "Brain fog"   Provigil [Modafinil] Hives   Entresto [Sacubitril-Valsartan] Itching   Lisinopril Cough   Current Meds  Medication Sig   acetaminophen (TYLENOL) 500 MG tablet Take 1,000 mg  by mouth every 6 (six) hours as needed.   budesonide (PULMICORT) 0.5 MG/2ML nebulizer solution TAKE 2 ML (0.5 MG TOTAL) BY NEBULIZATION TWICE A DAY   cetirizine (ZYRTEC) 10 MG tablet Take 1 tablet (10 mg total) by mouth daily.   cholestyramine (QUESTRAN) 4 g packet DISSOLVE AND DRINK 1 PACKET BY MOUTH AT BEDTIME   diltiazem (CARDIZEM CD) 240 MG 24 hr capsule Take 1 capsule (240 mg total) by mouth daily.   empagliflozin (JARDIANCE) 10 MG TABS tablet Take 1 tablet (10 mg total) by mouth daily before breakfast.   ipratropium-albuterol (DUONEB) 0.5-2.5 (3) MG/3ML SOLN TAKE 3 MLS BY NEBULIZATION EVERY 6 (SIX) HOURS AS NEEDED. J45.40   NON FORMULARY Bipap - nightly with Oxygen 2 Lpm   nystatin (MYCOSTATIN/NYSTOP) powder Apply topically 3 (three) times daily.   OXYGEN Inhale into the lungs. 2 LPM [place]   pantoprazole (PROTONIX) 40 MG tablet TAKE 1 TABLET BY MOUTH EVERY DAY   promethazine (PHENERGAN) 25 MG tablet TAKE 1 TABLET BY MOUTH EVERY 6 HOURS AS NEEDED FOR NAUSEA OR VOMITING.   rOPINIRole (REQUIP) 1 MG tablet Take 1 tablet (1 mg total) by mouth 3 (three) times daily.   torsemide (DEMADEX) 20 MG tablet Take 2 tablets (40 mg total) by mouth 2 (two) times daily. (Patient taking differently: Take 40 mg by mouth daily.)   Immunization History  Administered Date(s) Administered   Fluad Quad(high Dose 65+) 02/15/2019, 02/16/2020, 02/19/2021   Influenza Split 02/23/2018   Influenza-Unspecified 02/16/2014, 01/18/2017   Moderna Sars-Covid-2 Vaccination 01/05/2020, 02/01/2020   Pneumococcal Conjugate-13 05/16/2014   Pneumococcal Polysaccharide-23 03/12/2017, 07/16/2017       Objective:   Physical Exam BP 126/76 (BP Location: Right Arm, Cuff Size: Large)   Pulse 100   Temp 98 F (36.7 C) (Temporal)   Ht '5\' 4"'$  (1.626 m)   Wt (!) 333 lb 6.4 oz (151.2 kg)   SpO2 94%   BMI 57.23 kg/m  GENERAL: Morbidly obese woman, appears chronically ill.  No respiratory distress no tachypnea, no conversational  dyspnea. Presents in transport chair. HEAD: Normocephalic, atraumatic.  EYES: Pupils equal, round, reactive to light.  No scleral icterus.  MOUTH: Oral mucosa moist.  No thrush. NECK: Supple. No thyromegaly. Trachea midline. No JVD.  No adenopathy. PULMONARY: Good air entry bilaterally.  She is moving air very well she has absolutely no adventitious sounds. CARDIOVASCULAR: S1 and S2.  Mild tachycardia, regular rhythm.  Cannot appreciate murmur due to body habitus. ABDOMEN: Obese, otherwise benign. MUSCULOSKELETAL: No joint deformity, no clubbing, she has nonpitting edema of the lower extremities 2+ (query lipedema).  Decreased muscle mass. NEUROLOGIC: No overt focal deficit.  Gait not tested SKIN: Intact,warm,dry. PSYCH: Flat affect, normal behavior.     Assessment & Plan:     ICD-10-CM   1. Moderate persistent asthma without complication  G81.85 Pulmonary Function Test [facility] Only   Not using nebs consistently Recommend use of DuoNeb twice daily at least Use Pulmicort twice a day     2. Chronic  heart failure with preserved ejection fraction (HFpEF) (HCC)  I50.32    This issue adds complexity to her management Patient can distinguish when dyspnea due to volume overload Follows with cardiology and heart failure clinic    3. Shortness of breath  R06.02    MULTIFACTORIAL: Obesity/deconditioning Heart failure with preserved ejection fraction Asthma    4. Morbid obesity with BMI of 50.0-59.9, adult ([place])  E66.01    Z68.43    This issue adds complexity to her management Sarcopenic obesity Adds to her issues with dyspnea    5. Obesity hypoventilation syndrome (HCC)  E66.2    Trilogy at nighttime and with naps Weight loss highly recommended    6. OSA - on Trilogy  G47.33    Z99.89    Uses Trilogy vent Continue trilogy vent Download of compliance at next visit Follow-up with Dr. Mortimer Fries     Orders Placed This Encounter  Procedures   Pulmonary Function Test [facility] Only     Standing Status:   Future    Standing Expiration Date:   11/23/2022    Scheduling Instructions:     Prior to 82mo    Order Specific Question:   Full PFT: includes the following: basic spirometry, spirometry pre & post bronchodilator, diffusion capacity (DLCO), lung volumes    Answer:   Full PFT   The patient was instructed to be consistent with DuoNeb and Pulmicort therapy.  She also was instructed to be compliant with use of Trilogy ventilator.  She understands that if she does not use trilogy this will be discontinued.  Will obtain PFTs to reevaluate her lung function.  She has a follow-up with Dr. KMortimer Frieson 6 September at 2:15 PM and is to keep that appointment.  This will be a 257-monthollow-up.  She is to contact usKorearior to that time should any new difficulties arise.  C.Renold DonMD Advanced Bronchoscopy PCCM [facility]    *This note was dictated using voice recognition software/Dragon.  Despite best efforts to proofread, errors can occur which can change the meaning. Any transcriptional errors that result from this process are unintentional and may not be fully corrected at the time of dictation.

## 2021-11-22 NOTE — Patient Instructions (Signed)
I recommend that you do your DuoNeb and Pulmicort twice a day regularly.  You may use DuoNeb 2 extra times during the day if you are short of breath.  Continue using your Trilogy ventilator.  We should consider repeating the breathing tests.  We will schedule these so that they will be available for Dr. Mortimer Fries to review on your follow-up.  We will have you follow-up with Dr. Mortimer Fries again on 6 September 2:15 p.m. this will be a 75-monthfollow-up.

## 2021-11-23 ENCOUNTER — Telehealth: Payer: Self-pay | Admitting: Internal Medicine

## 2021-11-23 NOTE — Telephone Encounter (Signed)
According to our records[facility]. Spoke to patient and relayed this message. [facility] stated [facility] Nothing further needed.

## 2021-11-28 ENCOUNTER — Other Ambulatory Visit: Payer: Self-pay | Admitting: Internal Medicine

## 2021-11-28 NOTE — Telephone Encounter (Signed)
Requested medication (s) are due for refill today - yes  Requested medication (s) are on the active medication list -yes  Future visit scheduled -yes  Last refill: 10/18/21 #30  Notes to clinic: non delegated Rx  Requested Prescriptions  Pending Prescriptions Disp Refills   promethazine (PHENERGAN) 25 MG tablet [Pharmacy Med Name: PROMETHAZINE 25 MG TABLET] 30 tablet 0    Sig: TAKE 1 TABLET BY MOUTH EVERY 6 HOURS AS NEEDED FOR NAUSEA OR VOMITING.     Not Delegated - Gastroenterology: Antiemetics Failed - 11/28/2021  9:04 AM      Failed - This refill cannot be delegated      Passed - Valid encounter within last 6 months    Recent Outpatient Visits           3 months ago Chronic heart failure with preserved ejection fraction (HFpEF) [facility])   [facility] Glean Hess, MD   9 months ago Essential hypertension   [facility] Glean Hess, MD   1 year ago Asthma in adult, moderate persistent, with acute exacerbation   [facility] Glean Hess, MD   1 year ago Annual physical exam   [facility] Glean Hess, MD   2 years ago Annual physical exam   [facility] Glean Hess, MD       Future Appointments             In 2 weeks Kathlen Mody, [place], PA-C [place], [place]   In 1 month Flora Lipps, MD [facility] [place]   In 2 months Army Melia, Jesse Sans, MD [facility], [facility]               Requested Prescriptions  Pending Prescriptions Disp Refills   promethazine (PHENERGAN) 25 MG tablet [Pharmacy Med Name: PROMETHAZINE 25 MG TABLET] 30 tablet 0    Sig: TAKE 1 TABLET BY MOUTH EVERY 6 HOURS AS NEEDED FOR NAUSEA OR VOMITING.     Not Delegated - Gastroenterology: Antiemetics Failed - 11/28/2021  9:04 AM      Failed - This refill cannot be delegated      Passed - Valid encounter within last 6 months    Recent Outpatient Visits           3 months ago Chronic heart  failure with preserved ejection fraction (HFpEF) [facility])   [facility] Glean Hess, MD   9 months ago Essential hypertension   [facility] Glean Hess, MD   1 year ago Asthma in adult, moderate persistent, with acute exacerbation   [facility] Glean Hess, MD   1 year ago Annual physical exam   [facility] Glean Hess, MD   2 years ago Annual physical exam   [facility] Glean Hess, MD       Future Appointments             In 2 weeks Furth, [place], PA-C [place], [place]   In 1 month Flora Lipps, MD [facility] [place]   In 2 months Army Melia, Jesse Sans, MD [facility], [place]

## 2021-12-04 ENCOUNTER — Ambulatory Visit: Payer: Self-pay | Admitting: *Deleted

## 2021-12-04 ENCOUNTER — Other Ambulatory Visit: Payer: Self-pay | Admitting: Internal Medicine

## 2021-12-04 NOTE — Telephone Encounter (Signed)
Summary: nausea/ diarrhea   Patient states she is having a gastritis flare up and is experiencing nausea and diarrhea   Patient states promethazine (PHENERGAN) 25 MG tablet helps control the symptoms but refill was denied due to requesting to soon   Please assist patient further     Attempted to call patient regarding symptoms- left message to call office

## 2021-12-04 NOTE — Telephone Encounter (Signed)
Requested medication (s) are due for refill today - provider review  Requested medication (s) are on the active medication list -yes  Future visit scheduled -yes  Last refill: 11/28/21 #30  Notes to clinic: non delegated Rx, duplicate request  Requested Prescriptions  Pending Prescriptions Disp Refills   promethazine (PHENERGAN) 25 MG tablet [Pharmacy Med Name: PROMETHAZINE 25 MG TABLET] 30 tablet 0    Sig: TAKE 1 TABLET BY MOUTH EVERY 6 HOURS AS NEEDED FOR NAUSEA OR VOMITING.     Not Delegated - Gastroenterology: Antiemetics Failed - 12/04/2021  7:39 AM      Failed - This refill cannot be delegated      Passed - Valid encounter within last 6 months    Recent Outpatient Visits           3 months ago Chronic heart failure with preserved ejection fraction (HFpEF) [facility])   [facility] Glean Hess, MD   9 months ago Essential hypertension   [facility] Glean Hess, MD   1 year ago Asthma in adult, moderate persistent, with acute exacerbation   [facility] Glean Hess, MD   1 year ago Annual physical exam   [facility] Glean Hess, MD   2 years ago Annual physical exam   [facility] Glean Hess, MD       Future Appointments             In 1 week Furth, [place], PA-C [place], [place]   In 1 month Flora Lipps, MD [facility] [place]   In 2 months Army Melia, Jesse Sans, MD [facility], [facility]               Requested Prescriptions  Pending Prescriptions Disp Refills   promethazine (PHENERGAN) 25 MG tablet [Pharmacy Med Name: PROMETHAZINE 25 MG TABLET] 30 tablet 0    Sig: TAKE 1 TABLET BY MOUTH EVERY 6 HOURS AS NEEDED FOR NAUSEA OR VOMITING.     Not Delegated - Gastroenterology: Antiemetics Failed - 12/04/2021  7:39 AM      Failed - This refill cannot be delegated      Passed - Valid encounter within last 6 months    Recent Outpatient Visits            3 months ago Chronic heart failure with preserved ejection fraction (HFpEF) [name])   [facility] Glean Hess, MD   9 months ago Essential hypertension   [facility] Glean Hess, MD   1 year ago Asthma in adult, moderate persistent, with acute exacerbation   [facility] Glean Hess, MD   1 year ago Annual physical exam   [facility] Glean Hess, MD   2 years ago Annual physical exam   [facility] Glean Hess, MD       Future Appointments             In 1 week Furth, [place], PA-C [place], [place]   In 1 month Flora Lipps, MD [facility] [place]   In 2 months Army Melia, Jesse Sans, MD [facility], [place]

## 2021-12-04 NOTE — Telephone Encounter (Signed)
  Chief Complaint: Nausea Symptoms: Nausea, no vomiting, diarrhea,2x daily, takes lomotil. On fluid restriction with CHF, urine darker than usual, urinating less often. Frequency: x 3 days Pertinent Negatives: Patient denies dizziness Disposition: '[]'$ ED /'[]'$ Urgent Care (no appt availability in office) / '[x]'$ Appointment(In office/virtual)/ '[]'$  [place] [facility]/ '[]'$ Home Care/ '[x]'$ Refused Recommended Disposition /'[]'$ [place] [facility]/ '[]'$  Follow-up with PCP Additional Notes: Pt states "This is my flare up of gastritis and IBS." Pt. called to request refill of phenergan, states"That usually helps right away but pharmacy said too early." Advised  may need to be seen, requests ask Dr. Army Melia for phenergan first, "She'll usually call in a few." States she has 4 left but wants to have on hand.Assured pt NT would route to practice for PCPs review and final disposition.Advised ED for worsening symptoms, S/S dehydration. Please advise Reason for Disposition  Nausea lasts > 1 week  Answer Assessment - Initial Assessment Questions 1. NAUSEA SEVERITY: "How bad is the nausea?" (e.g., mild, moderate, severe; dehydration, weight loss)   - MILD: loss of appetite without change in eating habits   - MODERATE: decreased oral intake without significant weight loss, dehydration, or malnutrition   - SEVERE: inadequate caloric or fluid intake, significant weight loss, symptoms of dehydration     Moderate 2. ONSET: "When did the nausea begin?"     3 days ago 3. VOMITING: "Any vomiting?" If Yes, ask: "How many times today?"     No 4. RECURRENT SYMPTOM: "Have you had nausea before?" If Yes, ask: "When was the last time?" "What happened that time?"     Yes, phenergan helps. Diarrhea 2 this AM,Took Lomotil  5. CAUSE: "What do you think is causing the nausea?"     "My gastritis and IBS."  Protocols used: Nausea-A-AH

## 2021-12-05 ENCOUNTER — Other Ambulatory Visit: Payer: Self-pay | Admitting: Internal Medicine

## 2021-12-05 MED ORDER — PROMETHAZINE HCL 25 MG PO TABS: 25.0000 mg | ORAL_TABLET | ORAL | 0 refills | Status: DC | PRN

## 2021-12-05 NOTE — Telephone Encounter (Signed)
Informed pt that [facility] [place]. Pt verbalized understanding.  KP

## 2021-12-05 NOTE — Telephone Encounter (Signed)
Please review.  KP

## 2021-12-09 ENCOUNTER — Other Ambulatory Visit: Payer: Self-pay | Admitting: Internal Medicine

## 2021-12-09 DIAGNOSIS — K219 Gastro-esophageal reflux disease without esophagitis: Secondary | ICD-10-CM

## 2021-12-11 NOTE — Telephone Encounter (Signed)
Requested Prescriptions  Pending Prescriptions Disp Refills  . pantoprazole (PROTONIX) 40 MG tablet [Pharmacy Med Name: PANTOPRAZOLE SOD DR 40 MG TAB] 90 tablet 2    Sig: TAKE 1 TABLET BY MOUTH EVERY DAY     Gastroenterology: Proton Pump Inhibitors Passed - 12/09/2021  3:19 PM      Passed - Valid encounter within last 12 months    Recent Outpatient Visits          3 months ago Chronic heart failure with preserved ejection fraction (HFpEF) [facility])   [facility] Glean Hess, MD   9 months ago Essential hypertension   [facility] Glean Hess, MD   1 year ago Asthma in adult, moderate persistent, with acute exacerbation   [facility] Glean Hess, MD   1 year ago Annual physical exam   [facility] Glean Hess, MD   2 years ago Annual physical exam   [facility] Glean Hess, MD      Future Appointments            In 3 days Furth, [place], PA-C [place], [facility]   In 1 month Flora Lipps, MD [facility] [place]   In 2 months Army Melia, Jesse Sans, MD [facility], [place]

## 2021-12-12 ENCOUNTER — Telehealth: Payer: Self-pay | Admitting: Pulmonary Disease

## 2021-12-12 MED ORDER — IPRATROPIUM-ALBUTEROL 0.5-2.5 (3) MG/3ML IN SOLN: RESPIRATORY_TRACT | 2 refills | Status: DC

## 2021-12-12 NOTE — Telephone Encounter (Signed)
Albuterol solution has been sent to preferred pharmacy.  Patient is aware and voiced her understanding.  Nothing further needed.

## 2021-12-14 ENCOUNTER — Ambulatory Visit (INDEPENDENT_AMBULATORY_CARE_PROVIDER_SITE_OTHER): Payer: Medicare Other | Admitting: Medical

## 2021-12-14 ENCOUNTER — Encounter: Payer: Self-pay | Admitting: Medical

## 2021-12-14 VITALS — BP 120/50 | HR 111 | Ht 64.0 in | Wt 333.5 lb

## 2021-12-14 DIAGNOSIS — N183 Chronic kidney disease, stage 3 unspecified: Secondary | ICD-10-CM | POA: Diagnosis not present

## 2021-12-14 DIAGNOSIS — R000 Tachycardia, unspecified: Secondary | ICD-10-CM

## 2021-12-14 DIAGNOSIS — I5032 Chronic diastolic (congestive) heart failure: Secondary | ICD-10-CM | POA: Diagnosis not present

## 2021-12-14 DIAGNOSIS — I10 Essential (primary) hypertension: Secondary | ICD-10-CM | POA: Diagnosis not present

## 2021-12-14 NOTE — Patient Instructions (Signed)
Medication Instructions:  Your physician recommends that you continue on your current medications as directed. Please refer to the Current Medication list given to you today.  *If you need a refill on your cardiac medications before your next appointment, please call your pharmacy*   Lab Work: None ordered If you have labs (blood work) drawn today and your tests are completely normal, you will receive your results only by: Welcome (if you have MyChart) OR A paper copy in the mail If you have any lab test that is abnormal or we need to change your treatment, we will call you to review the results.   Testing/Procedures: None ordered   Follow-Up: At [facility], you and your health needs are our priority.  As part of our continuing mission to provide you with exceptional heart care, we have created designated Provider Care Teams.  These Care Teams include your primary Cardiologist (physician) and Advanced Practice Providers (APPs -  Physician Assistants and Nurse Practitioners) who all work together to provide you with the care you need, when you need it.  We recommend signing up for the patient portal called "MyChart".  Sign up information is provided on this After Visit Summary.  MyChart is used to connect with patients for Virtual Visits (Telemedicine).  Patients are able to view lab/test results, encounter notes, upcoming appointments, etc.  Non-urgent messages can be sent to your provider as well.   To learn more about what you can do with MyChart, go to [email or web address].    Your next appointment:   3 month(s)  The format for your next appointment:   In Person  Provider:   You may see Nelva Bush, MD or one of the following Advanced Practice Providers on your designated Care Team:   Murray Hodgkins, NP Christell Faith, PA-C [place] Kathlen Mody, [place]   Other Instructions N/A  Important Information About Sugar

## 2021-12-14 NOTE — Progress Notes (Unsigned)
Cardiology Office Note:    Date:  12/16/2021   ID:  Samantha Mason, DOB 01/15/1951, MRN [id number]  PCP:  Glean Hess, MD  [facility] Cardiologist:  Nelva Bush, MD  [facility] Electrophysiologist:  None   Referring MD: Glean Hess, MD   Chief Complaint: 1 month follow-up  History of Present Illness:    Samantha Mason is a 71 y.o. female with a hx of chronic HFpEF, hypertension, asthma, stroke (1995), morbid obesity, and obstructive sleep apnea, who presents for follow-up today related to heart failure.    In the setting of atypical chest pain, she previously underwent stress testing in February 2020, which was low risk with normal LV function.  She was hospitalized in April 2022 with dyspnea felt to be secondary to asthma exacerbation.  Echocardiogram showed an EF of 55 to 60% with grade 1 diastolic dysfunction.  Following discharge, she re-established care in our office in June 2022 in the setting of ongoing dyspnea and volume overload, at which time she was hypertensive and her diltiazem therapy was titrated while Lasix 40 mg daily was added.  Volume status was noted to be improved at follow-up in August 2022.  She was admitted with asthma and COPD exacerbation October 2022 and subsequently underwent PET/CT at [facility] in early October 2022, which showed no evidence of ischemia.   Ms. Methot was readmitted to [facility] in late February 2023 in the setting of dyspnea and acute on chronic HFpEF with 37 pound weight gain prior to admission.  She underwent aggressive IV diuresis with 31 pound weight loss during admission and she was discharged home at 328 pounds on March 10, on torsemide 40 mg daily.  Echocardiogram during admission showed an EF of 55 to 60% without any significant valvular disease.  She subsequently followed up in heart failure clinic on March 20, at which time she was noted to have 1+ bilateral lower extremity edema and weight was stable compared to  discharge.  She was felt to be euvolemic.  Jardiance therapy was added.  She was subsequently placed on Entresto by the provider [facility].  She developed severe itching with this and it was discontinued.  She had follow-up lab work early last week, notable for a potassium 4.8, BUN of 70, and creatinine of 2.17.    She was seen 09/05/21 and weight was up and she was contacted by [facility] team and told to increased her torsemide to '40mg'$  daily. Due to tachycardia diltiazem was increased to '240mg'$  daily.   Last seen 11/12/21 and reported persistent SOB and swelling. She reported improvement with torsemide and metolazone + potassium daily and weight was back down.   Today, the patient reports worsening SOB. Says weight has gone up, however it is the same on our scales as last time. She is being seen by the heart failure clinic and they have advised she take metolazone daily with her torsemide. They have ordered follow-up lab work . She has also seen pulmonology. On exam she has trace lower leg edema. She is using Trilogy as tolerated. No chest pain.   Past Medical History:  Diagnosis Date   Acute bronchitis    Anemia    Arthritis    Asthma    B12 deficiency 03/14/2017   Chest pain 07/02/2018   Chest pain    a. 06/2018 MV: EF 66%. No ischemia/infarct; b. 02/2021 PET CT: EF>60%, no ischemia/infarct.   Chronic heart failure with preserved ejection fraction (HFpEF) (  [place])    a. 08/2020 Echo: EF 55-60%, GrI DD; b. 06/2021 Echo: EF 55-60%, mild LVH, nl RV fxn, no signif valvular dzs.   COPD (chronic obstructive pulmonary disease) ([place]) 07/24/2018   Family history of blood clots 07/16/2017   Irritable bowel syndrome    Malignant hypertension    Obesity    OSA (obstructive sleep apnea)    uses cpap   Pneumonia 2017   Restless leg syndrome    Stroke [facility]) 1995   mild    Past Surgical History:  Procedure Laterality Date   CHOLECYSTECTOMY     COLONOSCOPY WITH PROPOFOL N/A 03/31/2018    Procedure: COLONOSCOPY WITH PROPOFOL;  Surgeon: Lucilla Lame, MD;  Location: [facility] ENDOSCOPY;  Service: Endoscopy;  Laterality: N/A;   KNEE ARTHROPLASTY Right 04/30/2019   Procedure: COMPUTER ASSISTED TOTAL KNEE ARTHROPLASTY;  Surgeon: Dereck Leep, MD;  Location: [facility] ORS;  Service: Orthopedics;  Laterality: Right;   saliva gland removal  1998   TOTAL KNEE ARTHROPLASTY Left 2014   TUBAL LIGATION Bilateral     Current Medications: Current Meds  Medication Sig   diltiazem (CARDIZEM CD) 240 MG 24 hr capsule Take 1 capsule (240 mg total) by mouth daily.     Allergies:   Nuvigil [armodafinil], Penicillins, Gabapentin, Melatonin, Provigil [modafinil], Entresto [sacubitril-valsartan], and Lisinopril   Social History   Socioeconomic History   Marital status: Widowed    Spouse name: Not on file   Number of children: 2   Years of education: Not on file   Highest education level: Not on file  Occupational History   Not on file  Tobacco Use   Smoking status: Never   Smokeless tobacco: Never  Vaping Use   Vaping Use: Never used  Substance and Sexual Activity   Alcohol use: No    Alcohol/week: 0.0 standard drinks of alcohol   Drug use: No   Sexual activity: Never  Other Topics Concern   Not on file  Social History Narrative   Pt lives with her son   Social Determinants of Health   Financial Resource Strain: Medium Risk (08/14/2021)   Overall Financial Resource Strain (CARDIA)    Difficulty of Paying Living Expenses: Somewhat hard  Food Insecurity: Food Insecurity Present (08/08/2021)   Hunger Vital Sign    Worried About [place] in the Last Year: Often true    Ran Out of Food in the Last Year: Often true  Transportation Needs: No Transportation Needs (08/08/2021)   PRAPARE - Hydrologist (Medical): No    Lack of Transportation (Non-Medical): No  Physical Activity: Inactive (08/08/2021)   Exercise Vital Sign    Days of Exercise per  Week: 0 days    Minutes of Exercise per Session: 0 min  Stress: No Stress Concern Present (08/08/2021)   [place]    Feeling of Stress : Only a little  Social Connections: Socially Isolated (08/08/2021)   Social Connection and Isolation Panel [NHANES]    Frequency of Communication with Friends and Family: Once a week    Frequency of Social Gatherings with Friends and Family: Never    Attends Religious Services: Never    Marine scientist or Organizations: No    Attends Archivist Meetings: Never    Marital Status: Widowed     Family History: The patient's family history includes COPD in her father; Coronary artery disease (age of onset: 84)  in her brother; Dementia in her mother; Diabetes in her father; Heart failure in her brother and maternal grandmother; Stroke in her maternal grandmother. There is no history of Breast cancer.  ROS:   Please see the history of present illness.     All other systems reviewed and are negative.  EKGs/Labs/Other Studies Reviewed:    The following studies were reviewed today:    Echo 07/16/21  1. Left ventricular ejection fraction, by estimation, is 55 to 60%. The  left ventricle has normal function. Left ventricular endocardial border  not optimally defined to evaluate regional wall motion. There is mild left  ventricular hypertrophy. Left  ventricular diastolic parameters are indeterminate.   2. Right ventricular systolic function is normal. The right ventricular  size is normal. Tricuspid regurgitation signal is inadequate for assessing  PA pressure.   3. The mitral valve is normal in structure. No evidence of mitral valve  regurgitation. No evidence of mitral stenosis.   4. The aortic valve is normal in structure. Aortic valve regurgitation is  not visualized. No aortic stenosis is present.   5. The inferior vena cava is normal in size with greater than 50%   respiratory variability, suggesting right atrial pressure of 3 mmHg.   6. Challenging image quality.    Echo 08/2020 1. Left ventricular ejection fraction, by estimation, is 55 to 60%. The  left ventricle has normal function. The left ventricle has no regional  wall motion abnormalities. Left ventricular diastolic parameters are  consistent with Grade I diastolic  dysfunction (impaired relaxation).   2. Right ventricular systolic function is normal. The right ventricular  size is normal.   3. Challenging image quality.    Myoview Lexiscan 2020 Narrative & Impression  Pharmacological myocardial perfusion imaging study with no significant  ischemia Normal wall motion, EF estimated at 66% No EKG changes concerning for ischemia at peak stress or in recovery. Low risk scan     Signed, Esmond Plants, MD, Ph.D [facility]    EKG:  EKG is ordered today.  The ekg ordered today demonstrates Sinus tachycardia ST 111bpm, LAD, low voltage, nonspecific ST changes  Recent Labs: 03/05/2021: ALT 10 07/25/2021: Hemoglobin 9.6; Magnesium 1.7; Platelets 309 11/12/2021: B Natriuretic Peptide 16.2; BUN 71; Creatinine, Ser 2.50; Potassium 3.5; Sodium 131  Recent Lipid Panel    Component Value Date/Time   CHOL 160 07/16/2021 0446   TRIG 57 07/16/2021 0446   HDL 81 07/16/2021 0446   CHOLHDL 2.0 07/16/2021 0446   VLDL 11 07/16/2021 0446   LDLCALC 68 07/16/2021 0446    Physical Exam:    VS:  BP (!) 120/50 (BP Location: Left Arm, Patient Position: Sitting, Cuff Size: Large)   Pulse (!) 111   Ht '5\' 4"'$  (1.626 m)   Wt (!) 333 lb 8 oz (151.3 kg)   SpO2 95%   BMI 57.25 kg/m     Wt Readings from Last 3 Encounters:  12/14/21 (!) 333 lb 8 oz (151.3 kg)  11/22/21 (!) 333 lb 6.4 oz (151.2 kg)  11/12/21 (!) 324 lb 6.4 oz (147.1 kg)     GEN:  Well nourished, well developed in no acute distress HEENT: Normal NECK: No JVD; No carotid bruits LYMPHATICS: No lymphadenopathy CARDIAC: RRR, no murmurs,  rubs, gallops RESPIRATORY:  Clear to auscultation without rales, wheezing or rhonchi  ABDOMEN: Soft, non-tender, non-distended MUSCULOSKELETAL:  No edema; No deformity  SKIN: Warm and dry NEUROLOGIC:  Alert and oriented x 3 PSYCHIATRIC:  Normal affect  ASSESSMENT:    1. Chronic heart failure with preserved ejection fraction (HFpEF) ([place])   2. Chronic diastolic heart failure ([place])   3. Essential hypertension   4. Sinus tachycardia   5. Stage 3 chronic kidney disease, unspecified whether stage 3a or 3b CKD ([place])    PLAN:    In order of problems listed above:  HFpEF Heart failure clinic has been adjusting medications. She is taking metolazone daily with torsemide. Says weight has gone down 1.5lbs. She is being followed by heart failure clinic at this time. They have set up labs for next week. I will not make any changes as the heart failure clinic is currently following this closely. Continue Jardiance  HTN BP is good today, 120/50. Continue current medications.   Sinus tachycardia Heart rate is 111bpm today, however this is a chronic issue. Continue Diltiazem '240mg'$  daily.   Obesity Weight loss encouraged.   CKD stage 3 She has labs scheduled for next week by the heart failure clinic.   Disposition: Follow up in 3 month(s) with MD/APP    Signed, Donja Tipping Ninfa Meeker, PA-C  12/16/2021 9:30 AM    [facility]

## 2021-12-17 ENCOUNTER — Other Ambulatory Visit: Payer: Self-pay | Admitting: Internal Medicine

## 2021-12-18 LAB — BASIC METABOLIC PANEL
BUN: 42 — AB (ref 4–21)
CO2: 29 — AB (ref 13–22)
Chloride: 92 — AB (ref 99–108)
Creatinine: 1.9 — AB (ref 0.5–1.1)
Glucose: 114
Potassium: 5 mEq/L (ref 3.5–5.1)
Sodium: 138 (ref 137–147)

## 2021-12-18 LAB — COMPREHENSIVE METABOLIC PANEL
Calcium: 9.4 (ref 8.7–10.7)
eGFR: 29

## 2021-12-18 NOTE — Telephone Encounter (Signed)
Requested medication (s) are due for refill today - provider review  Requested medication (s) are on the active medication list -yes  Future visit scheduled -yes  Last refill: 12/05/21 #30  Notes to clinic: non delegated Rx  Requested Prescriptions  Pending Prescriptions Disp Refills   promethazine (PHENERGAN) 25 MG tablet [Pharmacy Med Name: PROMETHAZINE 25 MG TABLET] 30 tablet 0    Sig: TAKE 1 TABLET BY MOUTH EVERY 4 HOURS AS NEEDED.     Not Delegated - Gastroenterology: Antiemetics Failed - 12/17/2021  9:51 AM      Failed - This refill cannot be delegated      Passed - Valid encounter within last 6 months    Recent Outpatient Visits           3 months ago Chronic heart failure with preserved ejection fraction (HFpEF) [facility])   [facility] Glean Hess, MD   10 months ago Essential hypertension   [facility] Glean Hess, MD   1 year ago Asthma in adult, moderate persistent, with acute exacerbation   [facility] Glean Hess, MD   1 year ago Annual physical exam   [facility] Glean Hess, MD   2 years ago Annual physical exam   [facility] Glean Hess, MD       Future Appointments             In 1 month Flora Lipps, MD [facility] [place]   In 2 months Army Melia Jesse Sans, MD [facility]   In 2 months Furth, Cadence H, PA-C [place], [facility]               Requested Prescriptions  Pending Prescriptions Disp Refills   promethazine (PHENERGAN) 25 MG tablet [Pharmacy Med Name: PROMETHAZINE 25 MG TABLET] 30 tablet 0    Sig: TAKE 1 TABLET BY MOUTH EVERY 4 HOURS AS NEEDED.     Not Delegated - Gastroenterology: Antiemetics Failed - 12/17/2021  9:51 AM      Failed - This refill cannot be delegated      Passed - Valid encounter within last 6 months    Recent Outpatient Visits           3 months ago Chronic heart failure with preserved ejection  fraction (HFpEF) [facility])   [facility] Glean Hess, MD   10 months ago Essential hypertension   [facility] Glean Hess, MD   1 year ago Asthma in adult, moderate persistent, with acute exacerbation   [facility] Glean Hess, MD   1 year ago Annual physical exam   [facility] Glean Hess, MD   2 years ago Annual physical exam   [facility] Glean Hess, MD       Future Appointments             In 1 month Flora Lipps, MD [place]   In 2 months Army Melia Jesse Sans, MD [facility], [place]   In 2 months Furth, Cadence H, PA-C [place], [place]

## 2022-01-03 ENCOUNTER — Telehealth: Payer: Self-pay | Admitting: Internal Medicine

## 2022-01-03 NOTE — Telephone Encounter (Signed)
Spoke with Samantha Mason at [facility] to advise that this request needs to be sent over to her pulmonary doctor. Provided her with their number & Fax number. She updated their records and will send that over to them.

## 2022-01-03 NOTE — Telephone Encounter (Signed)
Calling to make provider aware that they will being faxing an order for oxygen for pt. Please advise

## 2022-01-08 ENCOUNTER — Ambulatory Visit: Payer: Medicare Other | Admitting: Family

## 2022-01-09 ENCOUNTER — Encounter: Payer: Self-pay | Admitting: Family

## 2022-01-09 ENCOUNTER — Ambulatory Visit: Payer: Medicare Other | Attending: Family | Admitting: Family

## 2022-01-09 VITALS — BP 136/55 | HR 98 | Resp 18 | Ht 64.0 in | Wt 333.5 lb

## 2022-01-09 DIAGNOSIS — J9610 Chronic respiratory failure, unspecified whether with hypoxia or hypercapnia: Secondary | ICD-10-CM | POA: Diagnosis not present

## 2022-01-09 DIAGNOSIS — I5032 Chronic diastolic (congestive) heart failure: Secondary | ICD-10-CM | POA: Diagnosis not present

## 2022-01-09 DIAGNOSIS — Z7984 Long term (current) use of oral hypoglycemic drugs: Secondary | ICD-10-CM | POA: Diagnosis not present

## 2022-01-09 DIAGNOSIS — I130 Hypertensive heart and chronic kidney disease with heart failure and stage 1 through stage 4 chronic kidney disease, or unspecified chronic kidney disease: Secondary | ICD-10-CM | POA: Diagnosis present

## 2022-01-09 DIAGNOSIS — Z9981 Dependence on supplemental oxygen: Secondary | ICD-10-CM | POA: Insufficient documentation

## 2022-01-09 DIAGNOSIS — G4733 Obstructive sleep apnea (adult) (pediatric): Secondary | ICD-10-CM | POA: Insufficient documentation

## 2022-01-09 DIAGNOSIS — I10 Essential (primary) hypertension: Secondary | ICD-10-CM

## 2022-01-09 DIAGNOSIS — J449 Chronic obstructive pulmonary disease, unspecified: Secondary | ICD-10-CM | POA: Insufficient documentation

## 2022-01-09 DIAGNOSIS — N1832 Chronic kidney disease, stage 3b: Secondary | ICD-10-CM | POA: Diagnosis not present

## 2022-01-09 NOTE — Progress Notes (Signed)
Name: Samantha Mason  DOB: Mar 13, 2051  MRN: [id number]  Samantha Mason is a 71 y.o. F with diastolic CHF, and COPD with chronic respiratory failure on 2 L nasal cannula as well as obstructive sleep apnea on CPAP, stage IIIb chronic kidney disease, hypertension and morbid obesity.   Admitted 2/26-3/10/23 for acute on chronic respiratory failure and HF exacerbation. Diuresed and eventually discharged back home.   Echo from 07/16/21 reviewed and showed EF 55-60% with mild LVH.   She presents for a follow-up visit with a chief complaint of moderate shortness of breath with little exertion and fluid retention. She says that this has been chronic in nature and she feels like it has been worsening some lately over the past month. Has associated fatigue. Denies dizziness, headaches, wheezing, cough, palpitations, chest pain/pressure. Does have occasional abdominal pain with constipation. Still following a low-sodium diet and not adding salt to foods.   Participating in [place] home HF program. HR at home ranging from 80-90's.   Past Medical History:  Diagnosis Date   Acute bronchitis    Anemia    Arthritis    Asthma    B12 deficiency 03/14/2017   Chest pain 07/02/2018   Chest pain    a. 06/2018 MV: EF 66%. No ischemia/infarct; b. 02/2021 PET CT: EF>60%, no ischemia/infarct.   Chronic heart failure with preserved ejection fraction (HFpEF) ([place])    a. 08/2020 Echo: EF 55-60%, GrI DD; b. 06/2021 Echo: EF 55-60%, mild LVH, nl RV fxn, no signif valvular dzs.   COPD (chronic obstructive pulmonary disease) ([place]) 07/24/2018   Family history of blood clots 07/16/2017   Irritable bowel syndrome    Malignant hypertension    Obesity    OSA (obstructive sleep apnea)    uses cpap   Pneumonia 2017   Restless leg syndrome    Stroke [facility]) 1995   mild   Past Surgical History:  Procedure Laterality Date   CHOLECYSTECTOMY     COLONOSCOPY WITH PROPOFOL N/A 03/31/2018   Procedure: COLONOSCOPY WITH PROPOFOL;   Surgeon: Lucilla Lame, MD;  Location: [facility] ENDOSCOPY;  Service: Endoscopy;  Laterality: N/A;   KNEE ARTHROPLASTY Right 04/30/2019   Procedure: COMPUTER ASSISTED TOTAL KNEE ARTHROPLASTY;  Surgeon: Dereck Leep, MD;  Location: [facility] ORS;  Service: Orthopedics;  Laterality: Right;   saliva gland removal  1998   TOTAL KNEE ARTHROPLASTY Left 2014   TUBAL LIGATION Bilateral    Family History  Problem Relation Age of Onset   Dementia Mother    COPD Father        68   Diabetes Father    Heart failure Brother    Coronary artery disease Brother 42       CABG   Heart failure Maternal Grandmother    Stroke Maternal Grandmother    Breast cancer Neg Hx    Social History   Tobacco Use   Smoking status: Never   Smokeless tobacco: Never  Substance Use Topics   Alcohol use: No    Alcohol/week: 0.0 standard drinks of alcohol   Allergies  Allergen Reactions   Nuvigil [Armodafinil] Hives   Penicillins Diarrhea and Nausea And Vomiting    Did it involve swelling of the face/tongue/throat, SOB, or low BP? no Did it involve sudden or severe rash/hives, skin peeling, or any reaction on the inside of your mouth or nose? No Did you need to seek medical attention at a hospital or doctor's office? No When did it last happen?  in her 21s  If all above answers are "NO", may proceed with cephalosporin use.    Gabapentin Other (See Comments)    "wiped her out, couldn't stay awake"   Melatonin Other (See Comments)    "Brain fog"   Provigil [Modafinil] Hives   Entresto [Sacubitril-Valsartan] Itching   Lisinopril Cough   Prior to Admission medications   Medication Sig Start Date End Date Taking? Authorizing Provider  acetaminophen (TYLENOL) 500 MG tablet Take 1,000 mg by mouth every 6 (six) hours as needed.   Yes [provider]  budesonide (PULMICORT) 0.5 MG/2ML nebulizer solution TAKE 2 ML (0.5 MG TOTAL) BY NEBULIZATION TWICE A DAY 10/29/21  Yes Tyler Pita, MD  cholestyramine  (QUESTRAN) 4 g packet DISSOLVE AND DRINK 1 PACKET BY MOUTH AT BEDTIME 03/14/21  Yes Glean Hess, MD  empagliflozin (JARDIANCE) 10 MG TABS tablet Take 1 tablet (10 mg total) by mouth daily before breakfast. 08/07/21  Yes Hackney, Tina A, FNP  metolazone (ZAROXOLYN) 5 MG tablet Take 5 mg by mouth daily. 11/23/21  Yes [provider]  NON FORMULARY Bipap - nightly with Oxygen 2 Lpm   Yes [provider]  OXYGEN Inhale into the lungs. 2 LPM [place]   Yes [provider]  pantoprazole (PROTONIX) 40 MG tablet TAKE 1 TABLET BY MOUTH EVERY DAY 12/11/21  Yes Glean Hess, MD  Potassium Chloride ER 20 MEQ TBCR Take 2 tablets by mouth daily at 8 pm. 12/11/21  Yes [provider]  rOPINIRole (REQUIP) 1 MG tablet Take 1 tablet (1 mg total) by mouth 3 (three) times daily. 10/24/21  Yes Glean Hess, MD  torsemide (DEMADEX) 20 MG tablet Take 2 tablets (40 mg total) by mouth 2 (two) times daily. Patient taking differently: Take 40 mg by mouth daily. 11/12/21  Yes Hackney, Otila Kluver A, FNP  cetirizine (ZYRTEC) 10 MG tablet Take 1 tablet (10 mg total) by mouth daily. Patient not taking: Reported on 01/09/2022 12/07/14   Ashok Norris, MD  diltiazem (CARDIZEM CD) 240 MG 24 hr capsule Take 1 capsule (240 mg total) by mouth daily. 09/05/21 12/24/21  Theora Gianotti, NP  guaiFENesin (MUCINEX) 600 MG 12 hr tablet Take by mouth 2 (two) times daily. Patient not taking: Reported on 01/09/2022    [provider]  ipratropium-albuterol (DUONEB) 0.5-2.5 (3) MG/3ML SOLN TAKE 3 MLS BY NEBULIZATION EVERY 6 (SIX) HOURS AS NEEDED. J45.40 Patient not taking: Reported on 01/09/2022 12/12/21   Tyler Pita, MD  nystatin (MYCOSTATIN/NYSTOP) powder Apply topically 3 (three) times daily. Patient not taking: Reported on 01/09/2022 07/27/21   Annita Brod, MD  predniSONE (DELTASONE) 10 MG tablet Take 10 mg by mouth daily with breakfast. Patient not taking: Reported on 01/09/2022     [provider]  promethazine (PHENERGAN) 25 MG tablet Take 1 tablet (25 mg total) by mouth every 4 (four) hours as needed. Patient not taking: Reported on 01/09/2022 12/05/21 12/05/22  Glean Hess, MD  spironolactone (ALDACTONE) 12.5 mg TABS tablet Take 12.5 mg by mouth daily. Patient not taking: Reported on 01/09/2022    [provider]    Review of Systems  Constitutional:  Positive for malaise/fatigue.  HENT: Negative.    Eyes:  Negative for blurred vision and double vision.  Respiratory:  Positive for shortness of breath (worsening). Negative for cough, hemoptysis and wheezing.   Cardiovascular:  Positive for leg swelling. Negative for chest pain, palpitations and orthopnea.       Chest tightness  Gastrointestinal: Negative.   Genitourinary: Negative.   Musculoskeletal:  Positive for neck pain.  Skin: Negative.   Neurological:  Negative for dizziness and weakness.  Endo/Heme/Allergies: Negative.   Psychiatric/Behavioral:  Negative for depression. The patient has insomnia (has trilogy vent, has not used through the night yet).      Vitals:   01/09/22 1441  BP: (!) 136/55  Pulse: 98  Resp: 18  SpO2: 93%   Filed Weights   01/09/22 1441  Weight: (!) 333 lb 8 oz (151.3 kg)    Lab Results  Component Value Date   CREATININE 1.9 (A) 12/18/2021   CREATININE 2.50 (H) 11/12/2021   CREATININE 1.7 (A) 10/25/2021     Physical Exam Vitals and nursing note reviewed. Exam conducted with a chaperone present (son).  Constitutional:      General: She is not in acute distress.    Appearance: She is obese. She is not toxic-appearing.  Cardiovascular:     Rate and Rhythm: Normal rate and regular rhythm.     Heart sounds: No murmur heard. Pulmonary:     Effort: No tachypnea.     Breath sounds: No decreased breath sounds, wheezing, rhonchi or rales.     Comments: Diminished RLL Abdominal:     Palpations: Abdomen is soft.  Musculoskeletal:     Right lower leg:  Edema (+1) present.     Left lower leg: Edema (+1) present.  Skin:    General: Skin is warm and dry.     Capillary Refill: Capillary refill takes less than 2 seconds.  Neurological:     General: No focal deficit present.     Mental Status: She is alert and oriented to person, place, and time.  Psychiatric:        Mood and Affect: Mood normal.     Assessment and Plan:  Heart failure with preserved ejection fraction and structural changes- - NYHA III - euvolemic today - weighing daily, reviewed when to call to report weight gain - weight stable since last visit here 2 months ago; - not adding salt and is reading food labels for sodium content and serving size - already adhering to fluid restriction ~ 64 ounces/day - on torsemide '40mg'$  daily - was on lisinopril but stopped d/t cough & entresto stopped due to itching - on GDMT of empagliflozin - spironolactone caused itching (this has been added to allergy list) - encouraged her to wear compression socks each day  - BNP 07/15/21 103.2 - saw cardiology Kathlen Mody) on 12/14/21  2. HTN with CKD- - BP (136/55); doing daily BP checks through [facility] HF monitoring program - saw PCP Army Melia) 08/27/21 - BMP 12/18/21 reviewed and showed Na 138, K 5.0, Cr 1.9, GFR 29 - saw nephrology Candiss Norse) 08/09/21; returns in October or November  3. OSA/COPD- - wears O2 2 lpm at bedtime - saw pulmonology Patsey Berthold) 11/22/21 - has trilogy vent that she is wearing intermittently throughout the day; has been unable to sleep at night so hasn't gotten used to wearing it all night   Patient did not bring her medications nor a list. Each medication was verbally reviewed with the patient and she was encouraged to bring the bottles to every visit to confirm accuracy of list.    Return in  6 months, sooner if needed.

## 2022-01-09 NOTE — Patient Instructions (Signed)
Continue weighing daily and call for an overnight weight gain of 3 pounds or more or a weekly weight gain of more than 5 pounds.  °

## 2022-01-11 ENCOUNTER — Ambulatory Visit: Payer: Medicare Other | Attending: Pulmonary Disease

## 2022-01-11 DIAGNOSIS — J4540 Moderate persistent asthma, uncomplicated: Secondary | ICD-10-CM | POA: Insufficient documentation

## 2022-01-11 LAB — PULMONARY FUNCTION TEST ARMC ONLY
DL/VA % pred: 108 %
DL/VA: 4.5 ml/min/mmHg/L
DLCO unc % pred: 88 %
DLCO unc: 17.28 ml/min/mmHg
FEF 25-75 Post: 1.72 L/sec
FEF 25-75 Pre: 1.62 L/sec
FEF2575-%Change-Post: 5 %
FEF2575-%Pred-Post: 88 %
FEF2575-%Pred-Pre: 83 %
FEV1-%Change-Post: 1 %
FEV1-%Pred-Post: 66 %
FEV1-%Pred-Pre: 65 %
FEV1-Post: 1.52 L
FEV1-Pre: 1.5 L
FEV1FVC-%Change-Post: 0 %
FEV1FVC-%Pred-Pre: 107 %
FEV6-%Change-Post: 0 %
FEV6-%Pred-Post: 63 %
FEV6-%Pred-Pre: 63 %
FEV6-Post: 1.85 L
FEV6-Pre: 1.83 L
FEV6FVC-%Pred-Post: 104 %
FEV6FVC-%Pred-Pre: 104 %
FVC-%Change-Post: 0 %
FVC-%Pred-Post: 61 %
FVC-%Pred-Pre: 60 %
FVC-Post: 1.85 L
FVC-Pre: 1.83 L
Post FEV1/FVC ratio: 82 %
Post FEV6/FVC ratio: 100 %
Pre FEV1/FVC ratio: 82 %
Pre FEV6/FVC Ratio: 100 %
RV % pred: 82 %
RV: 1.8 L
TLC % pred: 81 %
TLC: 4.13 L

## 2022-01-11 MED ORDER — ALBUTEROL SULFATE (2.5 MG/3ML) 0.083% IN NEBU
2.5000 mg | INHALATION_SOLUTION | Freq: Once | RESPIRATORY_TRACT | Status: AC
  Administered 2022-01-11: 2.5 mg via RESPIRATORY_TRACT
  Filled 2022-01-11: qty 3

## 2022-01-19 ENCOUNTER — Other Ambulatory Visit: Payer: Self-pay | Admitting: Family

## 2022-01-23 ENCOUNTER — Emergency Department: Payer: Medicare Other

## 2022-01-23 ENCOUNTER — Ambulatory Visit (INDEPENDENT_AMBULATORY_CARE_PROVIDER_SITE_OTHER): Payer: Medicare Other | Admitting: Internal Medicine

## 2022-01-23 ENCOUNTER — Inpatient Hospital Stay
Admission: EM | Admit: 2022-01-23 | Discharge: 2022-02-01 | DRG: 286 | Disposition: A | Discharge status: Home / Self Care | Payer: Medicare Other | Attending: Obstetrics and Gynecology | Admitting: Obstetrics and Gynecology

## 2022-01-23 ENCOUNTER — Encounter: Payer: Self-pay | Admitting: Emergency Medicine

## 2022-01-23 ENCOUNTER — Other Ambulatory Visit: Payer: Self-pay

## 2022-01-23 ENCOUNTER — Encounter: Payer: Self-pay | Admitting: Internal Medicine

## 2022-01-23 VITALS — BP 120/60 | HR 118 | Temp 98.1°F | Ht 64.0 in | Wt 327.6 lb

## 2022-01-23 DIAGNOSIS — Z888 Allergy status to other drugs, medicaments and biological substances status: Secondary | ICD-10-CM

## 2022-01-23 DIAGNOSIS — N184 Chronic kidney disease, stage 4 (severe): Secondary | ICD-10-CM | POA: Insufficient documentation

## 2022-01-23 DIAGNOSIS — N1832 Chronic kidney disease, stage 3b: Secondary | ICD-10-CM | POA: Diagnosis present

## 2022-01-23 DIAGNOSIS — J449 Chronic obstructive pulmonary disease, unspecified: Secondary | ICD-10-CM | POA: Diagnosis present

## 2022-01-23 DIAGNOSIS — N179 Acute kidney failure, unspecified: Secondary | ICD-10-CM | POA: Diagnosis not present

## 2022-01-23 DIAGNOSIS — G4733 Obstructive sleep apnea (adult) (pediatric): Secondary | ICD-10-CM

## 2022-01-23 DIAGNOSIS — I5023 Acute on chronic systolic (congestive) heart failure: Secondary | ICD-10-CM | POA: Diagnosis not present

## 2022-01-23 DIAGNOSIS — E662 Morbid (severe) obesity with alveolar hypoventilation: Secondary | ICD-10-CM | POA: Diagnosis present

## 2022-01-23 DIAGNOSIS — J329 Chronic sinusitis, unspecified: Secondary | ICD-10-CM | POA: Diagnosis present

## 2022-01-23 DIAGNOSIS — Z9049 Acquired absence of other specified parts of digestive tract: Secondary | ICD-10-CM

## 2022-01-23 DIAGNOSIS — R0602 Shortness of breath: Principal | ICD-10-CM

## 2022-01-23 DIAGNOSIS — Z8249 Family history of ischemic heart disease and other diseases of the circulatory system: Secondary | ICD-10-CM

## 2022-01-23 DIAGNOSIS — R0902 Hypoxemia: Secondary | ICD-10-CM | POA: Diagnosis not present

## 2022-01-23 DIAGNOSIS — Z880 Allergy status to penicillin: Secondary | ICD-10-CM

## 2022-01-23 DIAGNOSIS — J9622 Acute and chronic respiratory failure with hypercapnia: Secondary | ICD-10-CM | POA: Diagnosis present

## 2022-01-23 DIAGNOSIS — Z8701 Personal history of pneumonia (recurrent): Secondary | ICD-10-CM

## 2022-01-23 DIAGNOSIS — Z96653 Presence of artificial knee joint, bilateral: Secondary | ICD-10-CM | POA: Diagnosis present

## 2022-01-23 DIAGNOSIS — J9621 Acute and chronic respiratory failure with hypoxia: Secondary | ICD-10-CM | POA: Diagnosis present

## 2022-01-23 DIAGNOSIS — K219 Gastro-esophageal reflux disease without esophagitis: Secondary | ICD-10-CM | POA: Diagnosis present

## 2022-01-23 DIAGNOSIS — E538 Deficiency of other specified B group vitamins: Secondary | ICD-10-CM | POA: Diagnosis present

## 2022-01-23 DIAGNOSIS — M1990 Unspecified osteoarthritis, unspecified site: Secondary | ICD-10-CM | POA: Diagnosis present

## 2022-01-23 DIAGNOSIS — I130 Hypertensive heart and chronic kidney disease with heart failure and stage 1 through stage 4 chronic kidney disease, or unspecified chronic kidney disease: Principal | ICD-10-CM | POA: Diagnosis present

## 2022-01-23 DIAGNOSIS — J309 Allergic rhinitis, unspecified: Secondary | ICD-10-CM | POA: Diagnosis present

## 2022-01-23 DIAGNOSIS — Z9981 Dependence on supplemental oxygen: Secondary | ICD-10-CM

## 2022-01-23 DIAGNOSIS — I272 Pulmonary hypertension, unspecified: Secondary | ICD-10-CM | POA: Diagnosis present

## 2022-01-23 DIAGNOSIS — M6284 Sarcopenia: Secondary | ICD-10-CM | POA: Diagnosis present

## 2022-01-23 DIAGNOSIS — J9601 Acute respiratory failure with hypoxia: Secondary | ICD-10-CM

## 2022-01-23 DIAGNOSIS — I5032 Chronic diastolic (congestive) heart failure: Secondary | ICD-10-CM | POA: Diagnosis present

## 2022-01-23 DIAGNOSIS — I700 Atherosclerosis of aorta: Secondary | ICD-10-CM | POA: Diagnosis present

## 2022-01-23 DIAGNOSIS — Z8673 Personal history of transient ischemic attack (TIA), and cerebral infarction without residual deficits: Secondary | ICD-10-CM

## 2022-01-23 DIAGNOSIS — D631 Anemia in chronic kidney disease: Secondary | ICD-10-CM | POA: Diagnosis present

## 2022-01-23 DIAGNOSIS — Z7951 Long term (current) use of inhaled steroids: Secondary | ICD-10-CM

## 2022-01-23 DIAGNOSIS — J019 Acute sinusitis, unspecified: Secondary | ICD-10-CM | POA: Diagnosis present

## 2022-01-23 DIAGNOSIS — G2581 Restless legs syndrome: Secondary | ICD-10-CM | POA: Diagnosis present

## 2022-01-23 DIAGNOSIS — Z79899 Other long term (current) drug therapy: Secondary | ICD-10-CM

## 2022-01-23 DIAGNOSIS — Z7984 Long term (current) use of oral hypoglycemic drugs: Secondary | ICD-10-CM

## 2022-01-23 DIAGNOSIS — N183 Chronic kidney disease, stage 3 unspecified: Secondary | ICD-10-CM | POA: Diagnosis present

## 2022-01-23 DIAGNOSIS — Z6841 Body Mass Index (BMI) 40.0 and over, adult: Secondary | ICD-10-CM

## 2022-01-23 DIAGNOSIS — E876 Hypokalemia: Secondary | ICD-10-CM | POA: Diagnosis not present

## 2022-01-23 DIAGNOSIS — N2581 Secondary hyperparathyroidism of renal origin: Secondary | ICD-10-CM | POA: Diagnosis present

## 2022-01-23 DIAGNOSIS — Z713 Dietary counseling and surveillance: Secondary | ICD-10-CM

## 2022-01-23 DIAGNOSIS — I5033 Acute on chronic diastolic (congestive) heart failure: Secondary | ICD-10-CM | POA: Diagnosis present

## 2022-01-23 DIAGNOSIS — Z20822 Contact with and (suspected) exposure to covid-19: Secondary | ICD-10-CM | POA: Diagnosis present

## 2022-01-23 DIAGNOSIS — I10 Essential (primary) hypertension: Secondary | ICD-10-CM | POA: Diagnosis present

## 2022-01-23 DIAGNOSIS — Z825 Family history of asthma and other chronic lower respiratory diseases: Secondary | ICD-10-CM

## 2022-01-23 LAB — BASIC METABOLIC PANEL
Anion gap: 13 (ref 5–15)
BUN: 58 mg/dL — ABNORMAL HIGH (ref 8–23)
CO2: 31 mmol/L (ref 22–32)
Calcium: 10 mg/dL (ref 8.9–10.3)
Chloride: 92 mmol/L — ABNORMAL LOW (ref 98–111)
Creatinine, Ser: 2.08 mg/dL — ABNORMAL HIGH (ref 0.44–1.00)
GFR, Estimated: 25 mL/min — ABNORMAL LOW (ref >60)
Glucose, Bld: 152 mg/dL — ABNORMAL HIGH (ref 70–99)
Potassium: 3.2 mmol/L — ABNORMAL LOW (ref 3.5–5.1)
Sodium: 136 mmol/L (ref 135–145)

## 2022-01-23 LAB — TROPONIN I (HIGH SENSITIVITY)
Troponin I (High Sensitivity): 11 ng/L (ref <18)
Troponin I (High Sensitivity): 10 ng/L (ref <18)

## 2022-01-23 LAB — CBC
HCT: 38 % (ref 36.0–46.0)
Hemoglobin: 11.7 g/dL — ABNORMAL LOW (ref 12.0–15.0)
MCH: 25.1 pg — ABNORMAL LOW (ref 26.0–34.0)
MCHC: 30.8 g/dL (ref 30.0–36.0)
MCV: 81.4 fL (ref 80.0–100.0)
Platelets: 368 10*3/uL (ref 150–400)
RBC: 4.67 MIL/uL (ref 3.87–5.11)
RDW: 15.7 % — ABNORMAL HIGH (ref 11.5–15.5)
WBC: 13.3 10*3/uL — ABNORMAL HIGH (ref 4.0–10.5)
nRBC: 0 % (ref 0.0–0.2)

## 2022-01-23 LAB — PROCALCITONIN: Procalcitonin: 0.16 ng/mL

## 2022-01-23 LAB — BRAIN NATRIURETIC PEPTIDE: B Natriuretic Peptide: 19.6 pg/mL (ref 0.0–100.0)

## 2022-01-23 LAB — RESP PANEL BY RT-PCR (FLU A&B, COVID) ARPGX2
Influenza A by PCR: NEGATIVE
Influenza B by PCR: NEGATIVE
SARS Coronavirus 2 by RT PCR: NEGATIVE

## 2022-01-23 LAB — D-DIMER, QUANTITATIVE: D-Dimer, Quant: 1.09 ug/mL-FEU — ABNORMAL HIGH (ref 0.00–0.50)

## 2022-01-23 MED ORDER — BUDESONIDE 0.5 MG/2ML IN SUSP
0.5000 mg | Freq: Two times a day (BID) | RESPIRATORY_TRACT | Status: DC
  Administered 2022-01-24 – 2022-02-01 (×17): 0.5 mg via RESPIRATORY_TRACT
  Filled 2022-01-23 (×18): qty 2

## 2022-01-23 MED ORDER — POTASSIUM CHLORIDE CRYS ER 20 MEQ PO TBCR
40.0000 meq | EXTENDED_RELEASE_TABLET | Freq: Every day | ORAL | Status: DC
  Filled 2022-01-23 (×2): qty 2

## 2022-01-23 MED ORDER — SODIUM CHLORIDE 0.9 % IV SOLN: 250.0000 mL | INTRAVENOUS | Status: DC | PRN

## 2022-01-23 MED ORDER — ONDANSETRON HCL 4 MG PO TABS: 4.0000 mg | ORAL_TABLET | Freq: Four times a day (QID) | ORAL | Status: DC | PRN

## 2022-01-23 MED ORDER — SODIUM CHLORIDE 0.9 % IV SOLN
500.0000 mg | Freq: Once | INTRAVENOUS | Status: AC
  Administered 2022-01-23: 500 mg via INTRAVENOUS
  Filled 2022-01-23: qty 5

## 2022-01-23 MED ORDER — TORSEMIDE 20 MG PO TABS
40.0000 mg | ORAL_TABLET | Freq: Every day | ORAL | Status: DC
  Filled 2022-01-23: qty 2

## 2022-01-23 MED ORDER — ONDANSETRON HCL 4 MG/2ML IJ SOLN: 4.0000 mg | Freq: Four times a day (QID) | INTRAMUSCULAR | Status: DC | PRN

## 2022-01-23 MED ORDER — ACETAMINOPHEN 500 MG PO TABS: 1000.0000 mg | ORAL_TABLET | Freq: Four times a day (QID) | ORAL | Status: DC | PRN

## 2022-01-23 MED ORDER — ENOXAPARIN SODIUM 80 MG/0.8ML IJ SOSY
0.5000 mg/kg | PREFILLED_SYRINGE | INTRAMUSCULAR | Status: DC
  Administered 2022-01-23 – 2022-01-31 (×9): 75 mg via SUBCUTANEOUS
  Filled 2022-01-23 (×3): qty 0.75
  Filled 2022-01-23: qty 0.8
  Filled 2022-01-23 (×3): qty 0.75
  Filled 2022-01-23: qty 0.8
  Filled 2022-01-23: qty 0.75

## 2022-01-23 MED ORDER — PANTOPRAZOLE SODIUM 40 MG PO TBEC
40.0000 mg | DELAYED_RELEASE_TABLET | Freq: Every day | ORAL | Status: DC
  Administered 2022-01-24 – 2022-02-01 (×9): 40 mg via ORAL
  Filled 2022-01-23 (×9): qty 1

## 2022-01-23 MED ORDER — METOLAZONE 5 MG PO TABS
5.0000 mg | ORAL_TABLET | Freq: Every day | ORAL | Status: DC
  Filled 2022-01-23 (×2): qty 1

## 2022-01-23 MED ORDER — SODIUM CHLORIDE 0.9 % IV SOLN
1.0000 g | Freq: Once | INTRAVENOUS | Status: AC
  Administered 2022-01-23: 1 g via INTRAVENOUS
  Filled 2022-01-23: qty 10

## 2022-01-23 MED ORDER — ENOXAPARIN SODIUM 40 MG/0.4ML IJ SOSY: 40.0000 mg | PREFILLED_SYRINGE | INTRAMUSCULAR | Status: DC

## 2022-01-23 MED ORDER — SODIUM CHLORIDE 0.9% FLUSH
3.0000 mL | Freq: Two times a day (BID) | INTRAVENOUS | Status: DC
  Administered 2022-01-23 – 2022-01-30 (×11): 3 mL via INTRAVENOUS

## 2022-01-23 MED ORDER — SODIUM CHLORIDE 0.9% FLUSH: 3.0000 mL | INTRAVENOUS | Status: DC | PRN

## 2022-01-23 MED ORDER — CHOLESTYRAMINE 4 G PO PACK
4.0000 g | PACK | Freq: Every day | ORAL | Status: DC
  Administered 2022-01-23 – 2022-01-31 (×8): 4 g via ORAL
  Filled 2022-01-23 (×10): qty 1

## 2022-01-23 MED ORDER — IPRATROPIUM-ALBUTEROL 0.5-2.5 (3) MG/3ML IN SOLN
3.0000 mL | Freq: Four times a day (QID) | RESPIRATORY_TRACT | Status: DC | PRN
  Administered 2022-01-24 – 2022-02-01 (×14): 3 mL via RESPIRATORY_TRACT
  Filled 2022-01-23 (×15): qty 3

## 2022-01-23 MED ORDER — ROPINIROLE HCL 1 MG PO TABS
1.0000 mg | ORAL_TABLET | Freq: Three times a day (TID) | ORAL | Status: DC
  Administered 2022-01-23 – 2022-02-01 (×26): 1 mg via ORAL
  Filled 2022-01-23 (×26): qty 1

## 2022-01-23 MED ORDER — DILTIAZEM HCL ER COATED BEADS 120 MG PO CP24
240.0000 mg | ORAL_CAPSULE | Freq: Every day | ORAL | Status: DC
  Administered 2022-01-24 – 2022-02-01 (×9): 240 mg via ORAL
  Filled 2022-01-23: qty 2
  Filled 2022-01-23 (×7): qty 1
  Filled 2022-01-23: qty 2

## 2022-01-23 NOTE — Assessment & Plan Note (Addendum)
AKI superimposed on CKD stage IIIb Renal function recently worsening. Cr on admission was 2.08 Recent baseline Cr 1.7 on 10/25/21.  Creatinine initially increased to 2.4, since improving, 2.01 today --Nephrology following --Monitor BMP daily --Diuresis per nephro --On Lasix drip with IV albumin 25 mg BID to augment diuresis -- 5 mg metolazone given today

## 2022-01-23 NOTE — Assessment & Plan Note (Addendum)
Patient with a history of COPD with chronic respiratory failure on 2 L of oxygen as needed, which she uses mostly at night Now has a continuous requirement with pulse oximetry of 88% Worsening hypoxia may be related to patient's underlying COPD as well as obesity hypoventilation syndrome She may need to be reassessed for  new oxygen need Pulmonology consult in a.m. for further recommendation Continue oxygen supplementation to maintain pulse oximetry greater than 92%

## 2022-01-23 NOTE — Assessment & Plan Note (Addendum)
Stable Continue inhaled steroids and as needed bronchodilator therapy Continue oxygen supplementation at 2 L to maintain spO2 90-94%.

## 2022-01-23 NOTE — Assessment & Plan Note (Signed)
Secondary to morbid obesity Continue trilogy at bedtime

## 2022-01-23 NOTE — H&P (Signed)
History and Physical    Patient: Samantha Mason KPT:[id number] DOB: [birth year] DOA: 01/23/2022 DOS: the patient was seen and examined on 01/23/2022 PCP: Glean Hess, MD  Patient coming from: Home  Chief Complaint:  Chief Complaint  Patient presents with   Shortness of Breath   HPI: Samantha Mason is a 71 y.o. female with medical history significant for morbid obesity with BMI of 56.13, obstructive sleep apnea on CPAP, COPD with chronic respiratory failure on 2 L of oxygen as needed, chronic diastolic dysfunction CHF with last known LVEF of 55 to 60%, hypertension with stage IV chronic kidney disease who presents to the ER from her pulmonologist office for evaluation of worsening shortness of breath for 2 days. Patient complains of shortness of breath both at rest and with exertion associated with a dry cough.  She denies having any fever or chills.  She weighs herself daily and has not gained weight, says she has lost 2 pounds in the last 24 hours.  She has orthopnea that is chronic.  She has no leg swelling. She has no abdominal pain, no nausea, no vomiting, no headache, no dizziness, no lightheadedness, no urinary frequency, no nocturia, no dysuria, no blurred vision, no focal deficit According to the patient she has had to use her home oxygen continuously for the last 2 days due to her shortness of breath and pulse oximetry of 88% on room air   Review of Systems: As mentioned in the history of present illness. All other systems reviewed and are negative. Past Medical History:  Diagnosis Date   Acute bronchitis    Anemia    Arthritis    Asthma    B12 deficiency 03/14/2017   Chest pain 07/02/2018   Chest pain    a. 06/2018 MV: EF 66%. No ischemia/infarct; b. 02/2021 PET CT: EF>60%, no ischemia/infarct.   Chronic heart failure with preserved ejection fraction (HFpEF) ([place])    a. 08/2020 Echo: EF 55-60%, GrI DD; b. 06/2021 Echo: EF 55-60%, mild LVH, nl RV fxn, no signif valvular  dzs.   COPD (chronic obstructive pulmonary disease) ([place]) 07/24/2018   Family history of blood clots 07/16/2017   Irritable bowel syndrome    Malignant hypertension    Obesity    OSA (obstructive sleep apnea)    uses cpap   Pneumonia 2017   Restless leg syndrome    Stroke [facility]) 1995   mild   Past Surgical History:  Procedure Laterality Date   CHOLECYSTECTOMY     COLONOSCOPY WITH PROPOFOL N/A 03/31/2018   Procedure: COLONOSCOPY WITH PROPOFOL;  Surgeon: Lucilla Lame, MD;  Location: [facility] ENDOSCOPY;  Service: Endoscopy;  Laterality: N/A;   KNEE ARTHROPLASTY Right 04/30/2019   Procedure: COMPUTER ASSISTED TOTAL KNEE ARTHROPLASTY;  Surgeon: Dereck Leep, MD;  Location: [facility] ORS;  Service: Orthopedics;  Laterality: Right;   saliva gland removal  1998   TOTAL KNEE ARTHROPLASTY Left 2014   TUBAL LIGATION Bilateral    Social History:  reports that she has never smoked. She has never used smokeless tobacco. She reports that she does not drink alcohol and does not use drugs.  Allergies  Allergen Reactions   Nuvigil [Armodafinil] Hives   Penicillins Diarrhea and Nausea And Vomiting    Did it involve swelling of the face/tongue/throat, SOB, or low BP? no Did it involve sudden or severe rash/hives, skin peeling, or any reaction on the inside of your mouth or nose? No Did you need to seek medical attention at  a hospital or doctor's office? No When did it last happen?  in her 23s    If all above answers are "NO", may proceed with cephalosporin use.    Spironolactone Itching   Gabapentin Other (See Comments)    "wiped her out, couldn't stay awake"   Melatonin Other (See Comments)    "Brain fog"   Provigil [Modafinil] Hives   Entresto [Sacubitril-Valsartan] Itching   Lisinopril Cough    Family History  Problem Relation Age of Onset   Dementia Mother    COPD Father        28   Diabetes Father    Heart failure Brother    Coronary artery disease Brother 45       CABG   Heart  failure Maternal Grandmother    Stroke Maternal Grandmother    Breast cancer Neg Hx     Prior to Admission medications   Medication Sig Start Date End Date Taking? Authorizing Provider  acetaminophen (TYLENOL) 500 MG tablet Take 1,000 mg by mouth every 6 (six) hours as needed.    [provider]  budesonide (PULMICORT) 0.5 MG/2ML nebulizer solution TAKE 2 ML (0.5 MG TOTAL) BY NEBULIZATION TWICE A DAY 10/29/21   Tyler Pita, MD  cholestyramine Lucrezia Starch) 4 g packet DISSOLVE AND DRINK 1 PACKET BY MOUTH AT BEDTIME 03/14/21   Glean Hess, MD  diltiazem (CARDIZEM CD) 240 MG 24 hr capsule Take 1 capsule (240 mg total) by mouth daily. 09/05/21   Theora Gianotti, NP  ipratropium-albuterol (DUONEB) 0.5-2.5 (3) MG/3ML SOLN TAKE 3 MLS BY NEBULIZATION EVERY 6 (SIX) HOURS AS NEEDED. J45.40 12/12/21   Tyler Pita, MD  JARDIANCE 10 MG TABS tablet TAKE 1 TABLET BY MOUTH DAILY BEFORE BREAKFAST. 01/19/22   Darylene Price A, FNP  metolazone (ZAROXOLYN) 5 MG tablet Take 5 mg by mouth daily. 11/23/21   [provider]  NON FORMULARY Bipap - nightly with Oxygen 2 Lpm    [provider]  OXYGEN Inhale into the lungs. 2 LPM [place]    [provider]  pantoprazole (PROTONIX) 40 MG tablet TAKE 1 TABLET BY MOUTH EVERY DAY 12/11/21   Glean Hess, MD  Potassium Chloride ER 20 MEQ TBCR Take 2 tablets by mouth daily at 8 pm. 12/11/21   [provider]  rOPINIRole (REQUIP) 1 MG tablet Take 1 tablet (1 mg total) by mouth 3 (three) times daily. 10/24/21   Glean Hess, MD  torsemide (DEMADEX) 20 MG tablet Take 2 tablets (40 mg total) by mouth 2 (two) times daily. Patient taking differently: Take 40 mg by mouth daily. 11/12/21   Alisa Graff, FNP    Physical Exam: Vitals:   01/23/22 1455 01/23/22 1815 01/23/22 1900 01/23/22 2000  BP:  (!) 145/57 (!) 144/74 139/61  Pulse:  95 93 88  Resp:  '14 19 16  '$ Temp:      TempSrc:      SpO2:  97% 96% 96%   Weight: (!) 148.3 kg     Height: '5\' 4"'$  (1.626 m)      Physical Exam Vitals and nursing note reviewed.  Constitutional:      Appearance: She is obese.  HENT:     Head: Normocephalic and atraumatic.     Mouth/Throat:     Mouth: Mucous membranes are moist.  Eyes:     Extraocular Movements: Extraocular movements intact.     Pupils: Pupils are equal, round, and reactive to light.  Cardiovascular:  Rate and Rhythm: Normal rate and regular rhythm.  Pulmonary:     Effort: Pulmonary effort is normal.     Breath sounds: Normal breath sounds.  Abdominal:     General: Bowel sounds are normal.     Palpations: Abdomen is soft.     Comments: Central adiposity  Musculoskeletal:        General: Normal range of motion.     Cervical back: Normal range of motion and neck supple.  Skin:    General: Skin is warm and dry.  Neurological:     General: No focal deficit present.     Mental Status: She is alert.  Psychiatric:        Mood and Affect: Mood normal.        Behavior: Behavior normal.     Data Reviewed: Relevant notes from primary care and specialist visits, past discharge summaries as available in EHR, including Care Everywhere. Prior diagnostic testing as pertinent to current admission diagnoses Updated medications and problem lists for reconciliation ED course, including vitals, labs, imaging, treatment and response to treatment Triage notes, nursing and pharmacy notes and ED provider's notes Notable results as noted in HPI .  Labs reviewed.  Procalcitonin 0.16, D-dimer 1.09, BNP 19.6, troponin 11, sodium 136, potassium 3.2, chloride 92, bicarb 31, glucose 152, BUN 58, creatinine 2.08, calcium 10, white count 13.3, hemoglobin 11.7, hematocrit 38, RDW 15.7, platelet count 368 Chest x-ray reviewed by me shows clear lungs with bilateral opacities most likely atelectasis CT scan of the chest without contrast shows mild lingular, anterior right middle lobe and posterior right lower  lobe linear atelectasis. Evidence of prior cholecystectomy. Colonic diverticulosis. Twelve-lead EKG reviewed by me shows sinus tachycardia with low voltage QRS There are no new results to review at this time.  Assessment and Plan: * Hypoxia Patient with a history of COPD with chronic respiratory failure on 2 L of oxygen as needed, which she uses mostly at night Now has a continuous requirement with pulse oximetry of 88% Worsening hypoxia may be related to patient's underlying COPD as well as obesity hypoventilation syndrome She may need to be reassessed for  new oxygen need Pulmonology consult in a.m. for further recommendation Continue oxygen supplementation to maintain pulse oximetry greater than 92%  Essential hypertension Blood pressure is stable Continue Cardizem  Chronic heart failure with preserved ejection fraction (HFpEF) (HCC) Stable and not acutely exacerbated Continue metolazone and Zaroxolyn Maintain low-sodium diet Check daily weights  Asthma with COPD (HCC) Stable Continue inhaled steroids and as needed bronchodilator therapy Continue oxygen supplementation at 2 L to maintain pulse oximetry greater than 92%  GERD (gastroesophageal reflux disease) Stable Continue PPI  CKD (chronic kidney disease) stage 3, GFR 30-59 ml/min (HCC) Renal function appears stable Monitor closely during this hospitalization  OSA  Secondary to morbid obesity Continue trilogy at bedtime  Morbid obesity with BMI of 50.0-59.9, adult ([place]) Patient has a BMI of 95.18 Complicates overall prognosis and care Lifestyle modification and exercise has been discussed with her in detail      Advance Care Planning:   Code Status: Full Code   Consults: Consult pulmonology in a.m.  Family Communication: Greater than 50% of time was spent discussing patient's condition and plan of care with her at the bedside.  All questions and concerns have been addressed.  She verbalizes understanding and  agrees with the plan.  Severity of Illness: The appropriate patient status for this patient is OBSERVATION. Observation status is judged to  be reasonable and necessary in order to provide the required intensity of service to ensure the patient's safety. The patient's presenting symptoms, physical exam findings, and initial radiographic and laboratory data in the context of their medical condition is felt to place them at decreased risk for further clinical deterioration. Furthermore, it is anticipated that the patient will be medically stable for discharge from the hospital within 2 midnights of admission.   Author: Collier Bullock, MD 01/23/2022 8:49 PM  For on call review [email or web address].

## 2022-01-23 NOTE — Assessment & Plan Note (Addendum)
Blood pressure is stable, soft diastolic. Continue Cardizem On Lasix drip

## 2022-01-23 NOTE — Assessment & Plan Note (Deleted)
Acute hypoxia improving with diuresis, but volume status on exam difficult to assess.   --On Lasix drip --Nephrology following given CKD and recent worsening --Hold home metolazone and torsemide --Low-sodium diet --Daily weights, I/O's --Monitor renal function and electrolytes

## 2022-01-23 NOTE — Assessment & Plan Note (Signed)
Patient has a BMI of 31.51 Complicates overall prognosis and care Lifestyle modification and exercise has been discussed with her in detail

## 2022-01-23 NOTE — Patient Instructions (Addendum)
EMERGENT TRANSFER TO ER FOR ACUTE CARDIAC FAILURE

## 2022-01-23 NOTE — ED Notes (Signed)
EDP, Archie Balboa at bedside.

## 2022-01-23 NOTE — Progress Notes (Addendum)
Subjective:    Patient ID: Samantha Mason, female    DOB: 1951-01-16, 71 y.o.   MRN: [id number] Patient Care Team: Glean Hess, MD as PCP - General (Internal Medicine) End, Harrell Gave, MD as PCP - Cardiology (Cardiology) Flora Lipps, MD as Consulting Physician (Pulmonary Disease) Murlean Iba, MD (Nephrology)  CC  Follow up chronic resp failure  HPI Severe resp distress in clinic Decompensated heart failure  Progressive worsening SOB and DOE NEB dod NIOT help this AM  Patient needs ER assessment and possible ICU admission for milrinone infusion PROGNOSIS IS VERY POOR   Patient Active Problem List   Diagnosis Date Noted   Chronic respiratory failure with hypoxia and hypercapnia ([place]) 10/15/2021   Chronic heart failure with preserved ejection fraction (HFpEF) ([place]) 08/27/2021   Hyponatremia 07/26/2021   Insomnia 07/22/2021   Stage 3a chronic kidney disease (CKD) ([place]) 07/15/2021   Morbid obesity ([place]) 07/15/2021   Myocardial injury 07/15/2021   History of stroke 11/17/2020   Ovarian failure 02/21/2020   Primary osteoarthritis of right knee 02/13/2019   S/P total knee arthroplasty, left 02/13/2019   Aortic atherosclerosis ([place]) 08/01/2018   Obstructive lung disease (generalized) ([place]) 07/24/2018   B12 deficiency anemia 07/07/2018   Benign neoplasm of ascending colon    Severe persistent asthma 02/13/2018   Vitamin D deficiency 03/17/2017   Prediabetes 03/14/2017   Microcytic anemia 03/14/2017   Irritable bowel syndrome with diarrhea 03/12/2017   Allergic rhinitis 03/12/2017   Restless leg syndrome 03/12/2017   GERD (gastroesophageal reflux disease) 10/16/2016   Essential hypertension 10/16/2016   Morbid obesity with BMI of 50.0-59.9, adult ([place]) 12/08/2014   OSA  12/08/2014   Shortness of breath 04/07/2014   Social History   Tobacco Use   Smoking status: Never   Smokeless tobacco: Never  Substance Use Topics   Alcohol use: No    Alcohol/week:  0.0 standard drinks of alcohol   Allergies  Allergen Reactions   Nuvigil [Armodafinil] Hives   Penicillins Diarrhea and Nausea And Vomiting    Did it involve swelling of the face/tongue/throat, SOB, or low BP? no Did it involve sudden or severe rash/hives, skin peeling, or any reaction on the inside of your mouth or nose? No Did you need to seek medical attention at a hospital or doctor's office? No When did it last happen?  in her 8s    If all above answers are "NO", may proceed with cephalosporin use.    Spironolactone Itching   Gabapentin Other (See Comments)    "wiped her out, couldn't stay awake"   Melatonin Other (See Comments)    "Brain fog"   Provigil [Modafinil] Hives   Entresto [Sacubitril-Valsartan] Itching   Lisinopril Cough   No outpatient medications have been marked as taking for the 01/23/22 encounter (Appointment) with Flora Lipps, MD.   Immunization History  Administered Date(s) Administered   Fluad Quad(high Dose 65+) 02/15/2019, 02/16/2020, 02/19/2021   Influenza Split 02/23/2018   Influenza-Unspecified 02/16/2014, 01/18/2017   Moderna Sars-Covid-2 Vaccination 01/05/2020, 02/01/2020   Pneumococcal Conjugate-13 05/16/2014   Pneumococcal Polysaccharide-23 03/12/2017, 07/16/2017           Review of Systems: Gen:  +resp distress HEENT: Denies blurred vision, double vision, ear pain, eye pain, hearing loss, nose bleeds, sore throat Cardiac:  No dizziness, chest pain or heaviness, chest tightness,edema, No JVD Resp:   No cough, -sputum production, +shortness of breath,+wheezing, -hemoptysis,  Other:  All other systems negative  BP 120/60 (BP Location: Left  Wrist, Cuff Size: Normal)   Pulse (!) 118   Temp 98.1 F (36.7 C) (Temporal)   Ht '5\' 4"'$  (1.626 m)   Wt (!) 327 lb 9.6 oz (148.6 kg)   SpO2 93%   BMI 56.23 kg/m     Physical Examination:   General Appearance: Ill appearing and very SOB EYES PERRLA, EOM intact.   NECK Supple, No  JVD Pulmonary: normal breath sounds, No wheezing.  CardiovascularNormal S1,S2.  No m/r/g.   Abdomen: Benign, Soft, non-tender. +EDEMA ALL OTHER ROS ARE NEGATIVE      Assessment & Plan:      ACUTE ON Chronic heart failure with preserved ejection fraction -PATIENT WILL NEED ER ASSESSMENT AND ADMISSION FOR CARDIAC FAILURE MANAGEMENT          Shortness of breath      MULTIFACTORIAL: Obesity/deconditioning Heart failure with preserved ejection fraction Asthma     Morbid obesity with BMI of 50.0-59.9, adult ([place])          This issue adds complexity to her management Sarcopenic obesity Adds to her issues with dyspnea                             OVERALL PROGNOSIS IS VERY POOR CONSIDER PALLIATIVE CARE CONSULTATION UPON ADMISSION    Total Time Spent  35 mins    Maretta Bees Patricia Pesa, M.D.  [facility]  Medical Director [place] Director [place] Department

## 2022-01-23 NOTE — Assessment & Plan Note (Signed)
Stable.  Continue PPI. 

## 2022-01-23 NOTE — ED Provider Notes (Signed)
[facility] Provider Note    Event Date/Time   First MD Initiated Contact with Patient 01/23/22 1739     (approximate)   History   Shortness of Breath   HPI  Samantha Mason is a 71 y.o. female  who presents to the emergency department today because of concern for shortness of breath. Patient is coming from pulmonary clinic. The patient says that over the past couple of days she has had worsening shortness of breath. She has not noticed any leg swelling with this. She had not noticed any weight gain over this period. The patient denies any fevers.    Physical Exam   Triage Vital Signs: ED Triage Vitals  Enc Vitals Group     BP 01/23/22 1454 (!) 128/59     Pulse Rate 01/23/22 1454 (!) 110     Resp 01/23/22 1454 20     Temp 01/23/22 1454 98.7 F (37.1 C)     Temp Source 01/23/22 1454 Oral     SpO2 01/23/22 1454 92 %     Weight 01/23/22 1455 (!) 327 lb (148.3 kg)     Height 01/23/22 1455 '5\' 4"'$  (1.626 m)     Head Circumference --      Peak Flow --      Pain Score 01/23/22 1455 0     Pain Loc --      Pain Edu? --      Excl. in [place]? --     Most recent vital signs: Vitals:   01/23/22 1454  BP: (!) 128/59  Pulse: (!) 110  Resp: 20  Temp: 98.7 F (37.1 C)  SpO2: 92%   General: Awake, alert, oriented. CV:  Good peripheral perfusion. Regular rate and rhythm. Resp:  Slightly increased work of breathing.  Abd:  No distention.     ED Results / Procedures / Treatments   Labs (all labs ordered are listed, but only abnormal results are displayed) Labs Reviewed  BASIC METABOLIC PANEL - Abnormal; Notable for the following components:      Result Value   Potassium 3.2 (*)    Chloride 92 (*)    Glucose, Bld 152 (*)    BUN 58 (*)    Creatinine, Ser 2.08 (*)    GFR, Estimated 25 (*)    All other components within normal limits  CBC - Abnormal; Notable for the following components:   WBC 13.3 (*)    Hemoglobin 11.7 (*)    MCH 25.1 (*)     RDW 15.7 (*)    All other components within normal limits  D-DIMER, QUANTITATIVE (NOT AT [facility]) - Abnormal; Notable for the following components:   D-Dimer, Quant 1.09 (*)    All other components within normal limits  RESP PANEL BY RT-PCR (FLU A&B, COVID) ARPGX2  BRAIN NATRIURETIC PEPTIDE  PROCALCITONIN  HIV ANTIBODY (ROUTINE TESTING W REFLEX)  CBC  BASIC METABOLIC PANEL  TROPONIN I (HIGH SENSITIVITY)  TROPONIN I (HIGH SENSITIVITY)     EKG  I, Nance Pear, attending physician, personally viewed and interpreted this EKG  EKG Time: 1503 Rate: 114 Rhythm: sinus tachycardia Axis: normal Intervals: qtc 435 QRS: narrow, low voltage ST changes: no st elevation Impression: abnormal ekg  RADIOLOGY I independently interpreted and visualized the CXR. My interpretation: No large pneumonia. Radiology interpretation:  IMPRESSION:  Mild bibasilar opacities, likely due atelectasis.     PROCEDURES:  Critical Care performed: No  Procedures   MEDICATIONS ORDERED IN ED: Medications -  No data to display   IMPRESSION / MDM / [place] / ED COURSE  I reviewed the triage vital signs and the nursing notes.                              Differential diagnosis includes, but is not limited to, pneumonia, pneumothorax, CHF, ACS.  Patient's presentation is most consistent with acute presentation with potential threat to life or bodily function.  Patient presented to the emergency department today from pulmonary clinic because of concerns for respiratory distress.  On exam here lungs are clear.  Patient with minimally increased work of breathing.  Blood work here without elevation of BNP.  Mild leukocytosis.  Procalcitonin very minimally elevated.  Chest x-ray does show some opacities.  Radiology question atelectasis.  Given Mason leukocytosis did order antibiotics in case of pneumonia.  Discussed with Dr. Francine Graven with the hospital service will plan on admission.  FINAL  CLINICAL IMPRESSION(S) / ED DIAGNOSES   Final diagnoses:  SOB (shortness of breath)     Note:  This document was prepared using Dragon voice recognition software and may include unintentional dictation errors.    Nance Pear, MD 01/23/22 2238

## 2022-01-23 NOTE — ED Triage Notes (Signed)
Patient to ED for SOB-worsening over the past 2 days. Patient states she has been wearing her PRN O2 all the time due to O2 being 88% on RA. Patient currently on 2L [place] at 93%. Hx of COPD

## 2022-01-24 ENCOUNTER — Encounter: Payer: Self-pay | Admitting: Internal Medicine

## 2022-01-24 DIAGNOSIS — N1832 Chronic kidney disease, stage 3b: Secondary | ICD-10-CM | POA: Diagnosis present

## 2022-01-24 DIAGNOSIS — J449 Chronic obstructive pulmonary disease, unspecified: Secondary | ICD-10-CM

## 2022-01-24 DIAGNOSIS — E538 Deficiency of other specified B group vitamins: Secondary | ICD-10-CM | POA: Diagnosis present

## 2022-01-24 DIAGNOSIS — Z6841 Body Mass Index (BMI) 40.0 and over, adult: Secondary | ICD-10-CM | POA: Diagnosis not present

## 2022-01-24 DIAGNOSIS — Z20822 Contact with and (suspected) exposure to covid-19: Secondary | ICD-10-CM | POA: Diagnosis present

## 2022-01-24 DIAGNOSIS — Z515 Encounter for palliative care: Secondary | ICD-10-CM

## 2022-01-24 DIAGNOSIS — I272 Pulmonary hypertension, unspecified: Secondary | ICD-10-CM | POA: Diagnosis present

## 2022-01-24 DIAGNOSIS — I5032 Chronic diastolic (congestive) heart failure: Secondary | ICD-10-CM | POA: Diagnosis not present

## 2022-01-24 DIAGNOSIS — N184 Chronic kidney disease, stage 4 (severe): Secondary | ICD-10-CM

## 2022-01-24 DIAGNOSIS — J9620 Acute and chronic respiratory failure, unspecified whether with hypoxia or hypercapnia: Secondary | ICD-10-CM | POA: Diagnosis not present

## 2022-01-24 DIAGNOSIS — N2581 Secondary hyperparathyroidism of renal origin: Secondary | ICD-10-CM | POA: Diagnosis present

## 2022-01-24 DIAGNOSIS — M1990 Unspecified osteoarthritis, unspecified site: Secondary | ICD-10-CM | POA: Diagnosis present

## 2022-01-24 DIAGNOSIS — K219 Gastro-esophageal reflux disease without esophagitis: Secondary | ICD-10-CM | POA: Diagnosis present

## 2022-01-24 DIAGNOSIS — I700 Atherosclerosis of aorta: Secondary | ICD-10-CM | POA: Diagnosis present

## 2022-01-24 DIAGNOSIS — Z9981 Dependence on supplemental oxygen: Secondary | ICD-10-CM | POA: Diagnosis not present

## 2022-01-24 DIAGNOSIS — J9621 Acute and chronic respiratory failure with hypoxia: Secondary | ICD-10-CM | POA: Diagnosis present

## 2022-01-24 DIAGNOSIS — E662 Morbid (severe) obesity with alveolar hypoventilation: Secondary | ICD-10-CM

## 2022-01-24 DIAGNOSIS — R0602 Shortness of breath: Principal | ICD-10-CM | POA: Insufficient documentation

## 2022-01-24 DIAGNOSIS — J019 Acute sinusitis, unspecified: Secondary | ICD-10-CM | POA: Diagnosis present

## 2022-01-24 DIAGNOSIS — J9622 Acute and chronic respiratory failure with hypercapnia: Secondary | ICD-10-CM

## 2022-01-24 DIAGNOSIS — M6284 Sarcopenia: Secondary | ICD-10-CM | POA: Diagnosis present

## 2022-01-24 DIAGNOSIS — Z8249 Family history of ischemic heart disease and other diseases of the circulatory system: Secondary | ICD-10-CM | POA: Diagnosis not present

## 2022-01-24 DIAGNOSIS — Z7189 Other specified counseling: Secondary | ICD-10-CM | POA: Diagnosis not present

## 2022-01-24 DIAGNOSIS — I5033 Acute on chronic diastolic (congestive) heart failure: Secondary | ICD-10-CM | POA: Diagnosis present

## 2022-01-24 DIAGNOSIS — D631 Anemia in chronic kidney disease: Secondary | ICD-10-CM | POA: Diagnosis present

## 2022-01-24 DIAGNOSIS — J309 Allergic rhinitis, unspecified: Secondary | ICD-10-CM | POA: Diagnosis present

## 2022-01-24 DIAGNOSIS — G2581 Restless legs syndrome: Secondary | ICD-10-CM | POA: Diagnosis present

## 2022-01-24 DIAGNOSIS — E876 Hypokalemia: Secondary | ICD-10-CM | POA: Diagnosis not present

## 2022-01-24 DIAGNOSIS — R0902 Hypoxemia: Secondary | ICD-10-CM | POA: Diagnosis not present

## 2022-01-24 DIAGNOSIS — N183 Chronic kidney disease, stage 3 unspecified: Secondary | ICD-10-CM | POA: Diagnosis not present

## 2022-01-24 DIAGNOSIS — N179 Acute kidney failure, unspecified: Secondary | ICD-10-CM | POA: Diagnosis not present

## 2022-01-24 DIAGNOSIS — I130 Hypertensive heart and chronic kidney disease with heart failure and stage 1 through stage 4 chronic kidney disease, or unspecified chronic kidney disease: Secondary | ICD-10-CM | POA: Diagnosis present

## 2022-01-24 LAB — BLOOD GAS, ARTERIAL
Acid-Base Excess: 13 mmol/L — ABNORMAL HIGH (ref 0.0–2.0)
Bicarbonate: 38.2 mmol/L — ABNORMAL HIGH (ref 20.0–28.0)
O2 Content: 2 L/min
O2 Saturation: 99.4 %
Patient temperature: 37
pCO2 arterial: 49 mmHg — ABNORMAL HIGH (ref 32–48)
pH, Arterial: 7.5 — ABNORMAL HIGH (ref 7.35–7.45)
pO2, Arterial: 89 mmHg (ref 83–108)

## 2022-01-24 LAB — CBC
HCT: 36.5 % (ref 36.0–46.0)
Hemoglobin: 10.8 g/dL — ABNORMAL LOW (ref 12.0–15.0)
MCH: 24.6 pg — ABNORMAL LOW (ref 26.0–34.0)
MCHC: 29.6 g/dL — ABNORMAL LOW (ref 30.0–36.0)
MCV: 83.1 fL (ref 80.0–100.0)
Platelets: 338 10*3/uL (ref 150–400)
RBC: 4.39 MIL/uL (ref 3.87–5.11)
RDW: 15.6 % — ABNORMAL HIGH (ref 11.5–15.5)
WBC: 13.2 10*3/uL — ABNORMAL HIGH (ref 4.0–10.5)
nRBC: 0 % (ref 0.0–0.2)

## 2022-01-24 LAB — BASIC METABOLIC PANEL
Anion gap: 9 (ref 5–15)
BUN: 58 mg/dL — ABNORMAL HIGH (ref 8–23)
CO2: 35 mmol/L — ABNORMAL HIGH (ref 22–32)
Calcium: 9 mg/dL (ref 8.9–10.3)
Chloride: 92 mmol/L — ABNORMAL LOW (ref 98–111)
Creatinine, Ser: 2.31 mg/dL — ABNORMAL HIGH (ref 0.44–1.00)
GFR, Estimated: 22 mL/min — ABNORMAL LOW (ref >60)
Glucose, Bld: 116 mg/dL — ABNORMAL HIGH (ref 70–99)
Potassium: 3.7 mmol/L (ref 3.5–5.1)
Sodium: 136 mmol/L (ref 135–145)

## 2022-01-24 LAB — HIV ANTIBODY (ROUTINE TESTING W REFLEX): HIV Screen 4th Generation wRfx: NONREACTIVE

## 2022-01-24 MED ORDER — FUROSEMIDE 10 MG/ML IJ SOLN
40.0000 mg | Freq: Two times a day (BID) | INTRAMUSCULAR | Status: DC
  Administered 2022-01-24 – 2022-01-25 (×3): 40 mg via INTRAVENOUS
  Filled 2022-01-24 (×3): qty 4

## 2022-01-24 NOTE — Consult Note (Signed)
NAME:  Samantha Mason, MRN:  [id number], DOB:  [birth year], LOS: 0 ADMISSION DATE:  01/23/2022, CONSULTATION DATE: 01/24/2022 REFERRING MD: Landis Gandy, MD, CHIEF COMPLAINT: Acute on chronic respiratory failure  History of Present Illness:  Samantha Mason is a 71 year old lifelong never smoker, morbidly obese woman, with history as noted below who was admitted to [facility] yesterday after being seen at the pulmonary clinic with respiratory distress.  The patient had noted a 2-day history of increasing dyspnea.  The patient had noted that nebulizers at home were not helping her.  She had not noted any weight gain.  We are asked to evaluate her today for her issues with respiratory distress.  On my evaluation of her today she appears to be at baseline, she is comfortable sitting up in bed and stating that she feels much better.  She states she feels that the nebulizers and the Lasix IV helped her.  She has extreme obesity with obesity hypoventilation and chronic respiratory failure secondary to the same with combined hypoxia and hypercarbia.  She is uncertain as to what "tipped her over" she has not had any fevers, chills or sweats.  She uses a trilogy vent at night and states that she has only been able to use it for approximately 3 hours as she frequently falls asleep before she can put the device on.  Even with this her PaCO2 on arterial blood gases today was 49 which is actually lower than her baseline 61.  She is not on steroids but has noted wheezing.  She has not had any chest pain.  She has chronic issues with lower extremity edema but not increased from baseline.  She has noted that she has difficulty when she goes outside with increasing shortness of breath.  She notes this is worse in the heat.  She is severely deconditioned.  She does not endorse any cough today.  Though she did have a dry cough yesterday.  No sputum production.  No hemoptysis.  No calf tenderness.  No chest pain.  Pertinent  Medical  History   Patient Active Problem List   Diagnosis Date Noted   SOB (shortness of breath)    Obesity hypoventilation syndrome ([place])    CKD (chronic kidney disease) stage 4, GFR 15-29 ml/min (HCC) 01/23/2022   Hypoxia 01/23/2022   Chronic respiratory failure with hypoxia and hypercapnia (HCC) 10/15/2021   Chronic heart failure with preserved ejection fraction (HFpEF) ([place]) 08/27/2021   Hyponatremia 07/26/2021   Insomnia 07/22/2021   Chronic asthmatic bronchitis ([place]) 07/15/2021   Morbid obesity ([place]) 07/15/2021   Myocardial injury 07/15/2021   History of stroke 11/17/2020   Ovarian failure 02/21/2020   Primary osteoarthritis of right knee 02/13/2019   S/P total knee arthroplasty, left 02/13/2019   Aortic atherosclerosis ([place]) 08/01/2018   B12 deficiency anemia 07/07/2018   Benign neoplasm of ascending colon    Severe persistent asthma 02/13/2018   Vitamin D deficiency 03/17/2017   Prediabetes 03/14/2017   Microcytic anemia 03/14/2017   Irritable bowel syndrome with diarrhea 03/12/2017   Allergic rhinitis 03/12/2017   Restless leg syndrome 03/12/2017   GERD (gastroesophageal reflux disease) 10/16/2016   Essential hypertension 10/16/2016   Morbid obesity with BMI of 50.0-59.9, adult ([place]) 12/08/2014   OSA  12/08/2014   Significant Hospital Events: Including procedures, antibiotic start and stop dates in addition to other pertinent events   9/6: Admitted to [facility] due to respiratory distress  Interim History / Subjective:  States she feels better today feels  like she is back to baseline.  States Lasix and nebulizer treatments helped her.  Objective   Blood pressure (!) 124/58, pulse 89, temperature 98.8 F (37.1 C), resp. rate 20, height '5\' 4"'$  (1.626 m), weight (!) 148.3 kg, SpO2 95 %.        Intake/Output Summary (Last 24 hours) at 01/24/2022 1655 Last data filed at 01/23/2022 2223 Gross per 24 hour  Intake 250.54 ml  Output --  Net 250.54 ml   Filed Weights   01/23/22  1455  Weight: (!) 148.3 kg    Examination: GENERAL: Morbidly obese woman, appears chronically ill.  No respiratory distress no tachypnea, no conversational dyspnea. Presents in transport chair. HEAD: Normocephalic, atraumatic.  EYES: Pupils equal, round, reactive to light.  No scleral icterus.  MOUTH: Oral mucosa moist.  No thrush. NECK: Supple. No thyromegaly. Trachea midline. No JVD.  No adenopathy. PULMONARY: Good air entry bilaterally.  She is moving air very well she has absolutely no adventitious sounds. CARDIOVASCULAR: S1 and S2.  Mild tachycardia, regular rhythm.  Cannot appreciate murmur due to body habitus. ABDOMEN: Obese, otherwise benign. MUSCULOSKELETAL: No joint deformity, no clubbing, she has nonpitting edema of the lower extremities 1+ (query lipedema).  Decreased muscle mass. NEUROLOGIC: No overt focal deficit.  Gait not tested SKIN: Intact,warm,dry. PSYCH: Flat affect, normal behavior.   Resolved Hospital Problem list     Assessment & Plan:  Acute on chronic respiratory failure with hypoxia and hypercarbia -appears compensated at present Precipitating factors: Volume overload, environmental exposures, query allergens Responded well to IV Lasix and bronchodilators Feels Lasix helped her the most Worsening renal function may be leading to volume overload Obesity with alveolar hypoventilation aggravates issue Continue Lasix as tolerated  Chronic asthmatic bronchitis Per PFTs mild Obesity with alveolar hypoventilation main issue Continue nebulization treatments Avoid systemic steroids as she has no bronchospasm at present Continue nebulized steroids We will check allergen panel Patient would benefit from weight loss No bronchospasm noted today From our standpoint may be discharged to home if continues to be stable in a.m. No evidence of infectious process Ambulatory oximetry on room air prior to discharge  Chronic heart failure with preserved ejection  fraction Continue diuretics as tolerated Patient's renal function has worsened Stage IV chronic kidney disease Query need for diuretic adjustment  Extreme obesity with obesity hypoventilation Obstructive sleep apnea  This issue adds complexity to her management Needs weight loss Continue Trilogy use at night  Sarcopenia/deconditioning This issue adds complexity to her management   Best Practice (right click and "Reselect all SmartList Selections" daily)   Diet/type: Regular consistency (see orders) 2 g sodium DVT prophylaxis: LMWH GI prophylaxis: PPI Lines: N/A Foley:  N/A Code Status:  full code Last date of multidisciplinary goals of care discussion [01/24/2022]  Labs   CBC: Recent Labs  Lab 01/23/22 1459 01/24/22 0518  WBC 13.3* 13.2*  HGB 11.7* 10.8*  HCT 38.0 36.5  MCV 81.4 83.1  PLT 368 242    Basic Metabolic Panel: Recent Labs  Lab 01/23/22 1459 01/24/22 0518  NA 136 136  K 3.2* 3.7  CL 92* 92*  CO2 31 35*  GLUCOSE 152* 116*  BUN 58* 58*  CREATININE 2.08* 2.31*  CALCIUM 10.0 9.0   GFR: Estimated Creatinine Clearance: 33.4 mL/min (A) (by C-G formula based on SCr of 2.31 mg/dL (H)). Recent Labs  Lab 01/23/22 1459 01/23/22 1813 01/24/22 0518  PROCALCITON  --  0.16  --   WBC 13.3*  --  13.2*  Liver Function Tests: No results for input(s): "AST", "ALT", "ALKPHOS", "BILITOT", "PROT", "ALBUMIN" in the last 168 hours. No results for input(s): "LIPASE", "AMYLASE" in the last 168 hours. No results for input(s): "AMMONIA" in the last 168 hours.  ABG    Component Value Date/Time   PHART 7.5 (H) 01/24/2022 1325   PCO2ART 49 (H) 01/24/2022 1325   PO2ART 89 01/24/2022 1325   HCO3 38.2 (H) 01/24/2022 1325   O2SAT 99.4 01/24/2022 1325     Coagulation Profile: No results for input(s): "INR", "PROTIME" in the last 168 hours.  Cardiac Enzymes: No results for input(s): "CKTOTAL", "CKMB", "CKMBINDEX", "TROPONINI" in the last 168  hours.  HbA1C: Hgb A1c MFr Bld  Date/Time Value Ref Range Status  07/15/2021 04:30 PM 5.4 4.8 - 5.6 % Final    Comment:    (NOTE) Pre diabetes:          5.7%-6.4%  Diabetes:              >6.4%  Glycemic control for   <7.0% adults with diabetes   02/19/2021 10:58 AM 6.2 (H) 4.8 - 5.6 % Final    Comment:    (NOTE) Pre diabetes:          5.7%-6.4%  Diabetes:              >6.4%  Glycemic control for   <7.0% adults with diabetes     CBG: No results for input(s): "GLUCAP" in the last 168 hours.  Review of Systems:   A 10 point review of systems was performed and it is as noted above otherwise negative.  Past Medical History:  She,  has a past medical history of Acute bronchitis, Anemia, Arthritis, Asthma, B12 deficiency (03/14/2017), Chest pain (07/02/2018), Chest pain, Chronic heart failure with preserved ejection fraction (HFpEF) ([place]), COPD (chronic obstructive pulmonary disease) ([place]) (07/24/2018), Family history of blood clots (07/16/2017), Irritable bowel syndrome, Malignant hypertension, Obesity, OSA (obstructive sleep apnea), Pneumonia (2017), Restless leg syndrome, and Stroke ([place]) (1995).   Surgical History:   Past Surgical History:  Procedure Laterality Date   CHOLECYSTECTOMY     COLONOSCOPY WITH PROPOFOL N/A 03/31/2018   Procedure: COLONOSCOPY WITH PROPOFOL;  Surgeon: Lucilla Lame, MD;  Location: [facility];  Service: Endoscopy;  Laterality: N/A;   KNEE ARTHROPLASTY Right 04/30/2019   Procedure: COMPUTER ASSISTED TOTAL KNEE ARTHROPLASTY;  Surgeon: Dereck Leep, MD;  Location: [facility] ORS;  Service: Orthopedics;  Laterality: Right;   saliva gland removal  1998   TOTAL KNEE ARTHROPLASTY Left 2014   TUBAL LIGATION Bilateral      Social History:   reports that she has never smoked. She has never used smokeless tobacco. She reports that she does not drink alcohol and does not use drugs.   Family History:  Her family history includes COPD in her father;  Coronary artery disease (age of onset: 35) in her brother; Dementia in her mother; Diabetes in her father; Heart failure in her brother and maternal grandmother; Stroke in her maternal grandmother. There is no history of Breast cancer.   Allergies Allergies  Allergen Reactions   Nuvigil [Armodafinil] Hives   Penicillins Diarrhea and Nausea And Vomiting    Did it involve swelling of the face/tongue/throat, SOB, or low BP? no Did it involve sudden or severe rash/hives, skin peeling, or any reaction on the inside of your mouth or nose? No Did you need to seek medical attention at a hospital or doctor's office? No When did it last happen?  in her 79s    If all above answers are "NO", may proceed with cephalosporin use.    Spironolactone Itching   Gabapentin Other (See Comments)    "wiped her out, couldn't stay awake"   Melatonin Other (See Comments)    "Brain fog"   Provigil [Modafinil] Hives   Entresto [Sacubitril-Valsartan] Itching   Lisinopril Cough     Home Medications  Prior to Admission medications   Medication Sig Start Date End Date Taking? Authorizing Provider  budesonide (PULMICORT) 0.5 MG/2ML nebulizer solution TAKE 2 ML (0.5 MG TOTAL) BY NEBULIZATION TWICE A DAY 10/29/21  Yes Tyler Pita, MD  diltiazem (CARDIZEM CD) 240 MG 24 hr capsule Take 1 capsule (240 mg total) by mouth daily. 09/05/21  Yes Theora Gianotti, NP  ipratropium-albuterol (DUONEB) 0.5-2.5 (3) MG/3ML SOLN TAKE 3 MLS BY NEBULIZATION EVERY 6 (SIX) HOURS AS NEEDED. J45.40 12/12/21  Yes Tyler Pita, MD  JARDIANCE 10 MG TABS tablet TAKE 1 TABLET BY MOUTH DAILY BEFORE BREAKFAST. 01/19/22  Yes Hackney, Aura Fey, FNP  NON FORMULARY Bipap - nightly with Oxygen 2 Lpm   Yes [provider]  OXYGEN Inhale into the lungs. 2 LPM [place]   Yes [provider]  pantoprazole (PROTONIX) 40 MG tablet TAKE 1 TABLET BY MOUTH EVERY DAY 12/11/21  Yes Glean Hess, MD  rOPINIRole (REQUIP) 1 MG tablet  Take 1 tablet (1 mg total) by mouth 3 (three) times daily. 10/24/21  Yes Glean Hess, MD  torsemide (DEMADEX) 20 MG tablet Take 2 tablets (40 mg total) by mouth 2 (two) times daily. Patient taking differently: Take 40 mg by mouth daily. 11/12/21  Yes Hackney, Otila Kluver A, FNP  acetaminophen (TYLENOL) 500 MG tablet Take 1,000 mg by mouth every 6 (six) hours as needed.    [provider]  cholestyramine (QUESTRAN) 4 g packet DISSOLVE AND DRINK 1 PACKET BY MOUTH AT BEDTIME Patient not taking: Reported on 01/24/2022 03/14/21   Glean Hess, MD  metolazone (ZAROXOLYN) 2.5 MG tablet Take 2.5 mg by mouth 3 (three) times a week. Every Monday, Wednesday, and Friday. 01/05/22   [provider]  Potassium Chloride ER 20 MEQ TBCR Take 20 mEq by mouth every Monday, Wednesday, and Friday. With metolazone 12/11/21   [provider]  PREVALITE 4 g packet Take 1 packet by mouth at bedtime. 01/18/22   [provider]    Scheduled Meds:  budesonide  0.5 mg Nebulization BID   cholestyramine  4 g Oral QHS   diltiazem  240 mg Oral Daily   enoxaparin (LOVENOX) injection  0.5 mg/kg Subcutaneous Q24H   furosemide  40 mg Intravenous BID   pantoprazole  40 mg Oral Daily   potassium chloride SA  40 mEq Oral Q2000   rOPINIRole  1 mg Oral TID   sodium chloride flush  3 mL Intravenous Q12H   Continuous Infusions:  sodium chloride     PRN Meds:.sodium chloride, acetaminophen, ipratropium-albuterol, ondansetron **OR** ondansetron (ZOFRAN) IV, sodium chloride flush  Level 4 consult    Overall the patient appears to be as compensated as she is to get.  Unfortunately her main issues of obesity with obesity hypoventilation cannot be further compensated.  It appears that her renal function is also deteriorating and this may be worsening her volume status.  Her prognosis long-term is poor.  I discussed with Dr. Zigmund Daniel obtaining Palliative Care consultation and this has been done.  The  patient should have follow-up with  Palliative Care as an adjunct to her support systems at home.  From our standpoint she appears to be well compensated.  Would recommend an ambulatory oximetry on room air to see if she qualifies for oxygen during ambulation.  Discussed with the patient also maximizing compliance with Trilogy ventilator.  Dr. Patricia Pesa will be available in the morning for pulmonary consultative services as needed.   Renold Don, MD Advanced Bronchoscopy PCCM [facility]    *This note was dictated using voice recognition software/Dragon.  Despite best efforts to proofread, errors can occur which can change the meaning. Any transcriptional errors that result from this process are unintentional and may not be fully corrected at the time of dictation.

## 2022-01-24 NOTE — Progress Notes (Signed)
PROGRESS NOTE    Samantha Mason  YQM:[id number] DOB: 11-16-1950 DOA: 01/23/2022 PCP: Glean Hess, MD  Brief Narrative: 71 year old female with morbid obesity, obstructive sleep apnea on trilogy for the last 6 months, COPD on O2 as needed, chronic diastolic heart failure with EF of 55 to 60%, CKD stage IV was sent from the pulmonologist office for evaluation of worsening shortness of breath for 2 days.  She reports that she is short of breath all the time.  She is short of breath at rest and with activity.  She does not add any added salt to her cooking.  And she takes her medications as prescribed.  In fact she has lost 2 pounds.  She reports that she is using trilogy at home and was able to stay out of the hospital for some time. Her saturation was 88% on room air in the ER.  Assessment & Plan:   Principal Problem:   Hypoxia Active Problems:   Essential hypertension   Morbid obesity with BMI of 50.0-59.9, adult (HCC)   OSA    CKD (chronic kidney disease) stage 3, GFR 30-59 ml/min (HCC)   GERD (gastroesophageal reflux disease)   Asthma with COPD (HCC)   Chronic heart failure with preserved ejection fraction (HFpEF) (HCC)  #1 acute on chronic hypoxic respiratory failure due to multifactorial reasons including morbid obesity, COPD, sleep apnea, diastolic heart failure and atelectasis  #2 history of diastolic heart failure-appears compensated, BNP is only 19.6 with a troponin of 11.  Continue Lasix 40 twice daily and restart metolazone she was taking at home prior to admission.  [place] on hold due to CKD Her recorded output is only 250 cc. Strict I's and O's and daily weights  #3 COPD/asthma continue bronchodilators and steroids.  Check ABG.  Will consult palliative care for goals of care discussion. Encourage use of incentive spirometer Influenza and COVID-negative respiratory virus panel negative  #4 GERD continue PPI  #5 CKD stage IIIb stable   #6 morbid  obesity/obstructive sleep apnea complicates overall prognosis and care.  Palliative care consulted.  Estimated body mass index is 56.13 kg/m as calculated from the following:   Height as of this encounter: '5\' 4"'$  (1.626 m).   Weight as of this encounter: 148.3 kg.  DVT prophylaxis: Lovenox  code Status: Full code  family Communication: None at bedside Disposition Plan:  Status is: Inpatient Remains inpatient appropriate because: Acute on chronic hypoxia   Consultants:  PCCM  Procedures: None Antimicrobials: None  Subjective: Patient is resting in bed with the head end of the bed elevated she reports she is still very short of breath and has a dry cough  Objective: Vitals:   01/24/22 0600 01/24/22 0830 01/24/22 1038 01/24/22 1039  BP: 125/68 (!) 144/57 (!) 141/56   Pulse: 87 95 95   Resp: (!) 21 (!) 21 19   Temp: 98.2 F (36.8 C)   98.6 F (37 C)  TempSrc:    Oral  SpO2: 100% 100% 99%   Weight:      Height:        Intake/Output Summary (Last 24 hours) at 01/24/2022 1255 Last data filed at 01/23/2022 2223 Gross per 24 hour  Intake 250.54 ml  Output --  Net 250.54 ml   Filed Weights   01/23/22 1455  Weight: (!) 148.3 kg    Examination:  General exam: Appears in no acute distress Respiratory system: diminished at  the bases  cardiovascular system: S1 & S2 heard,  RRR. No JVD, murmurs, rubs, gallops or clicks.. Gastrointestinal system: Abdomen is nondistended, soft and nontender. No organomegaly or masses felt. Normal bowel sounds heard. Central nervous system: Alert and oriented. No focal neurological deficits. Extremities: trace edema  Skin: No rashes, lesions or ulcers Psychiatry: Judgement and insight appear normal. Mood & affect appropriate.     Data Reviewed: I have personally reviewed following labs and imaging studies  CBC: Recent Labs  Lab 01/23/22 1459 01/24/22 0518  WBC 13.3* 13.2*  HGB 11.7* 10.8*  HCT 38.0 36.5  MCV 81.4 83.1  PLT 368 098    Basic Metabolic Panel: Recent Labs  Lab 01/23/22 1459 01/24/22 0518  NA 136 136  K 3.2* 3.7  CL 92* 92*  CO2 31 35*  GLUCOSE 152* 116*  BUN 58* 58*  CREATININE 2.08* 2.31*  CALCIUM 10.0 9.0   GFR: Estimated Creatinine Clearance: 33.4 mL/min (A) (by C-G formula based on SCr of 2.31 mg/dL (H)). Liver Function Tests: No results for input(s): "AST", "ALT", "ALKPHOS", "BILITOT", "PROT", "ALBUMIN" in the last 168 hours. No results for input(s): "LIPASE", "AMYLASE" in the last 168 hours. No results for input(s): "AMMONIA" in the last 168 hours. Coagulation Profile: No results for input(s): "INR", "PROTIME" in the last 168 hours. Cardiac Enzymes: No results for input(s): "CKTOTAL", "CKMB", "CKMBINDEX", "TROPONINI" in the last 168 hours. BNP (last 3 results) No results for input(s): "PROBNP" in the last 8760 hours. HbA1C: No results for input(s): "HGBA1C" in the last 72 hours. CBG: No results for input(s): "GLUCAP" in the last 168 hours. Lipid Profile: No results for input(s): "CHOL", "HDL", "LDLCALC", "TRIG", "CHOLHDL", "LDLDIRECT" in the last 72 hours. Thyroid Function Tests: No results for input(s): "TSH", "T4TOTAL", "FREET4", "T3FREE", "THYROIDAB" in the last 72 hours. Anemia Panel: No results for input(s): "VITAMINB12", "FOLATE", "FERRITIN", "TIBC", "IRON", "RETICCTPCT" in the last 72 hours. Sepsis Labs: Recent Labs  Lab 01/23/22 1813  PROCALCITON 0.16    Recent Results (from the past 240 hour(s))  Resp Panel by RT-PCR (Flu A&B, Covid) Anterior Nasal Swab     Status: None   Collection Time: 01/23/22  6:01 PM   Specimen: Anterior Nasal Swab  Result Value Ref Range Status   SARS Coronavirus 2 by RT PCR NEGATIVE NEGATIVE Final    Comment: (NOTE) SARS-CoV-2 target nucleic acids are NOT DETECTED.  The SARS-CoV-2 RNA is generally detectable in upper respiratory specimens during the acute phase of infection. The lowest concentration of SARS-CoV-2 viral copies this assay  can detect is 138 copies/mL. A negative result does not preclude SARS-Cov-2 infection and should not be used as the sole basis for treatment or other patient management decisions. A negative result may occur with  improper specimen collection/handling, submission of specimen other than nasopharyngeal swab, presence of viral mutation(s) within the areas targeted by this assay, and inadequate number of viral copies(<138 copies/mL). A negative result must be combined with clinical observations, patient history, and epidemiological information. The expected result is Negative.  Fact Sheet for Patients:  EntrepreneurPulse.com.au  Fact Sheet for Healthcare Providers:  IncredibleEmployment.be  This test is no t yet approved or cleared by the [place] FDA and  has been authorized for detection and/or diagnosis of SARS-CoV-2 by FDA under an Emergency Use Authorization (EUA). This EUA will remain  in effect (meaning this test can be used) for the duration of the COVID-19 declaration under Section 564(b)(1) of the Act, 21 U.S.C.section 360bbb-3(b)(1), unless the authorization is terminated  or revoked sooner.  Influenza A by PCR NEGATIVE NEGATIVE Final   Influenza B by PCR NEGATIVE NEGATIVE Final    Comment: (NOTE) The Xpert Xpress SARS-CoV-2/FLU/RSV plus assay is intended as an aid in the diagnosis of influenza from Nasopharyngeal swab specimens and should not be used as a sole basis for treatment. Nasal washings and aspirates are unacceptable for Xpert Xpress SARS-CoV-2/FLU/RSV testing.  Fact Sheet for Patients: EntrepreneurPulse.com.au  Fact Sheet for Healthcare Providers: IncredibleEmployment.be  This test is not yet approved or cleared by the [place] FDA and has been authorized for detection and/or diagnosis of SARS-CoV-2 by FDA under an Emergency Use Authorization (EUA). This EUA will  remain in effect (meaning this test can be used) for the duration of the COVID-19 declaration under Section 564(b)(1) of the Act, 21 U.S.C. section 360bbb-3(b)(1), unless the authorization is terminated or revoked.  Performed at [facility], [address] [place], [place] [address]          Radiology Studies: CT CHEST WO CONTRAST  Result Date: 01/23/2022 CLINICAL DATA:  Worsening shortness of breath. EXAM: CT CHEST WITHOUT CONTRAST TECHNIQUE: Multidetector CT imaging of the chest was performed following the standard protocol without IV contrast. RADIATION DOSE REDUCTION: This exam was performed according to the departmental dose-optimization program which includes automated exposure control, adjustment of the mA and/or kV according to patient size and/or use of iterative reconstruction technique. COMPARISON:  February 13, 2021 FINDINGS: Cardiovascular: There is mild to moderate severity calcification of the aortic arch, without evidence of aortic aneurysm. Normal heart size. No pericardial effusion. Mediastinum/Nodes: No enlarged mediastinal or axillary lymph nodes. Thyroid gland, trachea, and esophagus demonstrate no significant findings. Lungs/Pleura: There is mild lingular, anterior right middle lobe and posterior right lower lobe linear atelectasis. There is no evidence of a pleural effusion or pneumothorax. Upper Abdomen: Multiple surgical clips are seen within the gallbladder fossa. Noninflamed diverticula are seen along the splenic flexure. Musculoskeletal: Multilevel degenerative changes are seen throughout the thoracic spine. IMPRESSION: 1. Mild lingular, anterior right middle lobe and posterior right lower lobe linear atelectasis. 2. Evidence of prior cholecystectomy. 3. Colonic diverticulosis. Aortic Atherosclerosis (ICD10-I70.0). Electronically Signed   By: Virgina Norfolk M.D.   On: 01/23/2022 20:40   DG Chest 2 View  Result Date: 01/23/2022 CLINICAL DATA:  Shortness  of breath EXAM: CHEST - 2 VIEW COMPARISON:  Chest x-ray dated July 17, 2021 FINDINGS: Patient body habitus limits evaluation. Heart size and mediastinal contours are within normal limits. Mild bibasilar opacities. Linear opacity of the right lung base, similar to prior exam and likely due to scarring or atelectasis. Lungs otherwise clear. No pleural effusion or evidence of pneumothorax. IMPRESSION: Mild bibasilar opacities, likely due atelectasis. Electronically Signed   By: Yetta Glassman M.D.   On: 01/23/2022 15:25        Scheduled Meds:  budesonide  0.5 mg Nebulization BID   cholestyramine  4 g Oral QHS   diltiazem  240 mg Oral Daily   enoxaparin (LOVENOX) injection  0.5 mg/kg Subcutaneous Q24H   furosemide  40 mg Intravenous BID   pantoprazole  40 mg Oral Daily   potassium chloride SA  40 mEq Oral Q2000   rOPINIRole  1 mg Oral TID   sodium chloride flush  3 mL Intravenous Q12H   Continuous Infusions:  sodium chloride       LOS: 0 days    Time spent:38 min  Georgette Shell, MD  01/24/2022, 12:55 PM

## 2022-01-24 NOTE — Consult Note (Signed)
Consultation Note Date: 01/24/2022   Patient Name: Samantha Mason  DOB: 1951-04-15  MRN: [id number]  Age / Sex: 71 y.o., female  PCP: Glean Hess, MD Referring Physician: Georgette Shell, MD  Reason for Consultation: Establishing goals of care  HPI/Patient Profile: 71 y.o. female  with past medical history of morbid obesity with a BMI of 56, obstructive sleep apnea on CPAP/trilogy, COPD with chronic respiratory failure/2 L as needed, chronic diastolic heart failure with last EF of 55 to 60%, HTN with CKD 4, arthritis, IBS, restless leg, mild stroke in 1995 admitted on 01/23/2022 with acute on chronic hypoxic respiratory failure due to multifactorial reasons including morbid obesity, COPD, sleep apnea, diastolic heart failure, atelectasis.   Clinical Assessment and Goals of Care: I have reviewed medical records including EPIC notes, labs and imaging, received report from RN, assessed the patient.  Mrs. Samantha Mason, is sitting up in the [address] chair in the ED.  She greets me, making and mostly keeping eye contact.  She appears morbidly obese, chronically ill.  She is alert and oriented x3, able to make her basic needs known.  Her son, Ayvah Caroll, is present at bedside.  We meet at the bedside to discuss diagnosis prognosis, GOC, EOL wishes, disposition and options.  I introduced Palliative Medicine as specialized medical care for Mason living with serious illness. It focuses on providing relief from the symptoms and stress of a serious illness. The goal is to improve quality of life for both the patient and the family.  We discussed a brief life review of the patient.  He agrees is able to manage her own IADLs.  Her son Samantha Mason has lived in the family home for most of his life.  He is able to attend to household needs, cleaning, cooking, transportation.  Mrs. Litchford manages the finances at home  We  then focused on their current illness.  Overall, Samantha Mason is knowledgeable about her health concerns and the treatment plan.  She tells me that she is active with her outpatient pulmonologist.  She has BiPAP/trilogy at home.   She states that she would go home, not short-term rehab if qualified.  The natural disease trajectory and expectations at EOL were discussed.  Advanced directives, concepts specific to code status, artifical feeding and hydration, and rehospitalization were considered and discussed.  At this point Samantha Mason shares that she would want full scope/full code.  We talk about length of time she would be willing to spend on life support, trach/PEG after 2 weeks.  I encouraged her to consider her choices and continue discussions with her family.  Palliative Care services outpatient were explained and offered.  We talk about the benefits of outpatient palliative services for continued support.  She is considering the services.  Discussed the importance of continued conversation with family and the medical providers regarding overall plan of care and treatment options, ensuring decisions are within the context of the patient's values and GOCs.  Questions and concerns were addressed.  The patient was  encouraged to call with questions or concerns.  PMT will continue to support holistically.  Conference with attending, bedside nursing staff, transition of care team related to patient condition, needs, goals of care, disposition.   HCPOA NEXT OF KIN -son, Samantha Mason.  Daughter, Samantha Mason, lives in [place].    SUMMARY OF RECOMMENDATIONS   Continue full scope/full code Time for outcomes Home with home health if needed  Code Status/Advance Care Planning: Full code  Symptom Management:  Per hospitalist, no additional needs at this time.  Palliative Prophylaxis:  Frequent Pain Assessment and Oral Care  Additional Recommendations (Limitations, Scope, Preferences): Full Scope  Treatment  Psycho-social/Spiritual:  Desire for further Chaplaincy support:no Additional Recommendations: Caregiving  Support/Resources  Prognosis:  Unable to determine, based on outcomes.  Guarded at this point.  Discharge Planning: To Be Determined      Primary Diagnoses: Present on Admission:  Essential hypertension  GERD (gastroesophageal reflux disease)  Chronic heart failure with preserved ejection fraction (HFpEF) (HCC)  Hypoxia  CKD (chronic kidney disease) stage 3, GFR 30-59 ml/min (HCC)  Asthma with COPD ([place])   I have reviewed the medical record, interviewed the patient and family, and examined the patient. The following aspects are pertinent.  Past Medical History:  Diagnosis Date   Acute bronchitis    Anemia    Arthritis    Asthma    B12 deficiency 03/14/2017   Chest pain 07/02/2018   Chest pain    a. 06/2018 MV: EF 66%. No ischemia/infarct; b. 02/2021 PET CT: EF>60%, no ischemia/infarct.   Chronic heart failure with preserved ejection fraction (HFpEF) ([place])    a. 08/2020 Echo: EF 55-60%, GrI DD; b. 06/2021 Echo: EF 55-60%, mild LVH, nl RV fxn, no signif valvular dzs.   COPD (chronic obstructive pulmonary disease) ([place]) 07/24/2018   Family history of blood clots 07/16/2017   Irritable bowel syndrome    Malignant hypertension    Obesity    OSA (obstructive sleep apnea)    uses cpap   Pneumonia 2017   Restless leg syndrome    Stroke [facility]) 1995   mild   Social History   Socioeconomic History   Marital status: Widowed    Spouse name: Not on file   Number of children: 2   Years of education: Not on file   Highest education level: Not on file  Occupational History   Not on file  Tobacco Use   Smoking status: Never   Smokeless tobacco: Never  Vaping Use   Vaping Use: Never used  Substance and Sexual Activity   Alcohol use: No    Alcohol/week: 0.0 standard drinks of alcohol   Drug use: No   Sexual activity: Never  Other Topics Concern   Not on  file  Social History Narrative   Pt lives with her son   Social Determinants of Health   Financial Resource Strain: Medium Risk (08/14/2021)   Overall Financial Resource Strain (CARDIA)    Difficulty of Paying Living Expenses: Somewhat hard  Food Insecurity: Food Insecurity Present (08/08/2021)   Hunger Vital Sign    Worried About [place] in the Last Year: Often true    Ran Out of Food in the Last Year: Often true  Transportation Needs: No Transportation Needs (01/24/2022)   PRAPARE - Hydrologist (Medical): No    Lack of Transportation (Non-Medical): No  Physical Activity: Inactive (08/08/2021)   Exercise Vital Sign    Days of Exercise per  Week: 0 days    Minutes of Exercise per Session: 0 min  Stress: No Stress Concern Present (08/08/2021)   [place]    Feeling of Stress : Only a little  Social Connections: Socially Isolated (08/08/2021)   Social Connection and Isolation Panel [NHANES]    Frequency of Communication with Friends and Family: Once a week    Frequency of Social Gatherings with Friends and Family: Never    Attends Religious Services: Never    Marine scientist or Organizations: No    Attends Archivist Meetings: Never    Marital Status: Widowed   Family History  Problem Relation Age of Onset   Dementia Mother    COPD Father        1995   Diabetes Father    Heart failure Brother    Coronary artery disease Brother 52       CABG   Heart failure Maternal Grandmother    Stroke Maternal Grandmother    Breast cancer Neg Hx    Scheduled Meds:  budesonide  0.5 mg Nebulization BID   cholestyramine  4 g Oral QHS   diltiazem  240 mg Oral Daily   enoxaparin (LOVENOX) injection  0.5 mg/kg Subcutaneous Q24H   furosemide  40 mg Intravenous BID   pantoprazole  40 mg Oral Daily   potassium chloride SA  40 mEq Oral Q2000   rOPINIRole  1 mg Oral TID    sodium chloride flush  3 mL Intravenous Q12H   Continuous Infusions:  sodium chloride     PRN Meds:.sodium chloride, acetaminophen, ipratropium-albuterol, ondansetron **OR** ondansetron (ZOFRAN) IV, sodium chloride flush Medications Prior to Admission:  Prior to Admission medications   Medication Sig Start Date End Date Taking? Authorizing Provider  budesonide (PULMICORT) 0.5 MG/2ML nebulizer solution TAKE 2 ML (0.5 MG TOTAL) BY NEBULIZATION TWICE A DAY 10/29/21  Yes Tyler Pita, MD  diltiazem (CARDIZEM CD) 240 MG 24 hr capsule Take 1 capsule (240 mg total) by mouth daily. 09/05/21  Yes Theora Gianotti, NP  ipratropium-albuterol (DUONEB) 0.5-2.5 (3) MG/3ML SOLN TAKE 3 MLS BY NEBULIZATION EVERY 6 (SIX) HOURS AS NEEDED. J45.40 12/12/21  Yes Tyler Pita, MD  JARDIANCE 10 MG TABS tablet TAKE 1 TABLET BY MOUTH DAILY BEFORE BREAKFAST. 01/19/22  Yes Hackney, Aura Fey, FNP  NON FORMULARY Bipap - nightly with Oxygen 2 Lpm   Yes [provider]  OXYGEN Inhale into the lungs. 2 LPM [place]   Yes [provider]  pantoprazole (PROTONIX) 40 MG tablet TAKE 1 TABLET BY MOUTH EVERY DAY 12/11/21  Yes Glean Hess, MD  rOPINIRole (REQUIP) 1 MG tablet Take 1 tablet (1 mg total) by mouth 3 (three) times daily. 10/24/21  Yes Glean Hess, MD  torsemide (DEMADEX) 20 MG tablet Take 2 tablets (40 mg total) by mouth 2 (two) times daily. Patient taking differently: Take 40 mg by mouth daily. 11/12/21  Yes Hackney, Otila Kluver A, FNP  acetaminophen (TYLENOL) 500 MG tablet Take 1,000 mg by mouth every 6 (six) hours as needed.    [provider]  cholestyramine (QUESTRAN) 4 g packet DISSOLVE AND DRINK 1 PACKET BY MOUTH AT BEDTIME Patient not taking: Reported on 01/24/2022 03/14/21   Glean Hess, MD  metolazone (ZAROXOLYN) 2.5 MG tablet Take 2.5 mg by mouth 3 (three) times a week. Every Monday, Wednesday, and Friday. 01/05/22   [provider]  Potassium Chloride ER 20  MEQ  TBCR Take 20 mEq by mouth every Monday, Wednesday, and Friday. With metolazone 12/11/21   [provider]  PREVALITE 4 g packet Take 1 packet by mouth at bedtime. 01/18/22   [provider]   Allergies  Allergen Reactions   Nuvigil [Armodafinil] Hives   Penicillins Diarrhea and Nausea And Vomiting    Did it involve swelling of the face/tongue/throat, SOB, or low BP? no Did it involve sudden or severe rash/hives, skin peeling, or any reaction on the inside of your mouth or nose? No Did you need to seek medical attention at a hospital or doctor's office? No When did it last happen?  in her 6s    If all above answers are "NO", may proceed with cephalosporin use.    Spironolactone Itching   Gabapentin Other (See Comments)    "wiped her out, couldn't stay awake"   Melatonin Other (See Comments)    "Brain fog"   Provigil [Modafinil] Hives   Entresto [Sacubitril-Valsartan] Itching   Lisinopril Cough   Review of Systems  Unable to perform ROS: Severe respiratory distress    Physical Exam Vitals and nursing note reviewed.  Constitutional:      Appearance: She is obese.     Vital Signs: BP (!) 141/56   Pulse 95   Temp 98.6 F (37 C) (Oral)   Resp 19   Ht '5\' 4"'$  (1.626 m)   Wt (!) 148.3 kg   SpO2 99%   BMI 56.13 kg/m  Pain Scale: 0-10   Pain Score: 6    SpO2: SpO2: 99 % O2 Device:SpO2: 99 % O2 Flow Rate: .O2 Flow Rate (L/min): 2 L/min  IO: Intake/output summary:  Intake/Output Summary (Last 24 hours) at 01/24/2022 1344 Last data filed at 01/23/2022 2223 Gross per 24 hour  Intake 250.54 ml  Output --  Net 250.54 ml    LBM:   Baseline Weight: Weight: (!) 148.3 kg Most recent weight: Weight: (!) 148.3 kg     Palliative Assessment/Data:   Flowsheet Rows    Flowsheet Row Most Recent Value  Intake Tab   Referral Department Hospitalist  Unit at Time of Referral Cardiac/Telemetry Unit  Palliative Care Primary Diagnosis Pulmonary  Date Notified  01/24/22  Palliative Care Type New Palliative care  Reason for referral Clarify Goals of Care  Date of Admission 01/23/22  Date first seen by Palliative Care 01/24/22  # of days Palliative referral response time 0 Day(s)  # of days IP prior to Palliative referral 1  Clinical Assessment   Palliative Performance Scale Score 20%  Pain Max last 24 hours Not able to report  Pain Min Last 24 hours Not able to report  Dyspnea Max Last 24 Hours Not able to report  Dyspnea Min Last 24 hours Not able to report  Psychosocial & Spiritual Assessment   Palliative Care Outcomes        Time In: 1330 Time Out: 1425 Time Total: 55 minutes  Greater than 50%  of this time was spent counseling and coordinating care related to the above assessment and plan.  Signed by: Drue Novel, NP   Please contact Palliative Medicine Team phone at [phone] for questions and concerns.  For individual provider: See Shea Evans

## 2022-01-24 NOTE — Consult Note (Signed)
Cardiology Consultation   Patient ID: Samantha Mason MRN: [id number]; DOB: [birth year]  Admit date: 01/23/2022 Date of Consult: 01/24/2022  PCP:  Glean Hess, MD   [place] Providers Cardiologist:  Nelva Bush, MD   { Reason for consult: Worsening shortness of breath Physician requesting consult: Dr. Zigmund Daniel  Patient Profile:   Samantha Mason is a 71 y.o. female with a hx of morbid obesity, obesity hypoventilation syndrome on trilogy at home, chronic diastolic CHF, chronic kidney disease stage IV presenting from pulmonary clinic yesterday for worsening shortness of breath over the past several days  History of Present Illness:   Ms. Riggan reports that she had been doing well at home, compliant with her medications including torsemide with occasional metolazone use for 2 pound weight gain.  Despite this she has had worsening shortness of breath She feels her symptoms started September 1 after she vomited following going out for [employer].  Since then has not felt well, increasing shortness of breath.  She denies significant weight gain, weight still 327, close to her baseline She denies significant leg swelling, She is unable to exclude mild abdominal distention but it is not grossly distended She reports having little bit of wheezing, wonders if she could have asthma exacerbation  Currently resting supine in bed in the emergency room, on oxygen Reports that she does not use ambulatory oxygen at home, only hooked it up to her trilogy machine at night   Past Medical History:  Diagnosis Date   Acute bronchitis    Anemia    Arthritis    Asthma    B12 deficiency 03/14/2017   Chest pain 07/02/2018   Chest pain    a. 06/2018 MV: EF 66%. No ischemia/infarct; b. 02/2021 PET CT: EF>60%, no ischemia/infarct.   Chronic heart failure with preserved ejection fraction (HFpEF) (Samantha Mason)    a. 08/2020 Echo: EF 55-60%, GrI DD; b. 06/2021 Echo: EF 55-60%, mild  LVH, nl RV fxn, no signif valvular dzs.   COPD (chronic obstructive pulmonary disease) ([place]) 07/24/2018   Family history of blood clots 07/16/2017   Irritable bowel syndrome    Malignant hypertension    Obesity    OSA (obstructive sleep apnea)    uses cpap   Pneumonia 2017   Restless leg syndrome    Stroke [facility]) 1995   mild    Past Surgical History:  Procedure Laterality Date   CHOLECYSTECTOMY     COLONOSCOPY WITH PROPOFOL N/A 03/31/2018   Procedure: COLONOSCOPY WITH PROPOFOL;  Surgeon: Lucilla Lame, MD;  Location: [facility] ENDOSCOPY;  Service: Endoscopy;  Laterality: N/A;   KNEE ARTHROPLASTY Right 04/30/2019   Procedure: COMPUTER ASSISTED TOTAL KNEE ARTHROPLASTY;  Surgeon: Dereck Leep, MD;  Location: [facility] ORS;  Service: Orthopedics;  Laterality: Right;   saliva gland removal  1998   TOTAL KNEE ARTHROPLASTY Left 2014   TUBAL LIGATION Bilateral      Home Medications:  Prior to Admission medications   Medication Sig Start Date End Date Taking? Authorizing Provider  budesonide (PULMICORT) 0.5 MG/2ML nebulizer solution TAKE 2 ML (0.5 MG TOTAL) BY NEBULIZATION TWICE A DAY 10/29/21  Yes Tyler Pita, MD  diltiazem (CARDIZEM CD) 240 MG 24 hr capsule Take 1 capsule (240 mg total) by mouth daily. 09/05/21  Yes Theora Gianotti, NP  ipratropium-albuterol (DUONEB) 0.5-2.5 (3) MG/3ML SOLN TAKE 3 MLS BY NEBULIZATION EVERY 6 (SIX) HOURS AS NEEDED. J45.40 12/12/21  Yes Tyler Pita, MD  JARDIANCE 10 MG  TABS tablet TAKE 1 TABLET BY MOUTH DAILY BEFORE BREAKFAST. 01/19/22  Yes Hackney, Aura Fey, FNP  NON FORMULARY Bipap - nightly with Oxygen 2 Lpm   Yes [provider]  OXYGEN Inhale into the lungs. 2 LPM [place]   Yes [provider]  pantoprazole (PROTONIX) 40 MG tablet TAKE 1 TABLET BY MOUTH EVERY DAY 12/11/21  Yes Glean Hess, MD  rOPINIRole (REQUIP) 1 MG tablet Take 1 tablet (1 mg total) by mouth 3 (three) times daily. 10/24/21  Yes Glean Hess, MD   torsemide (DEMADEX) 20 MG tablet Take 2 tablets (40 mg total) by mouth 2 (two) times daily. Patient taking differently: Take 40 mg by mouth daily. 11/12/21  Yes Hackney, Otila Kluver A, FNP  acetaminophen (TYLENOL) 500 MG tablet Take 1,000 mg by mouth every 6 (six) hours as needed.    [provider]  cholestyramine (QUESTRAN) 4 g packet DISSOLVE AND DRINK 1 PACKET BY MOUTH AT BEDTIME Patient not taking: Reported on 01/24/2022 03/14/21   Glean Hess, MD  metolazone (ZAROXOLYN) 2.5 MG tablet Take 2.5 mg by mouth 3 (three) times a week. Every Monday, Wednesday, and Friday. 01/05/22   [provider]  Potassium Chloride ER 20 MEQ TBCR Take 20 mEq by mouth every Monday, Wednesday, and Friday. With metolazone 12/11/21   [provider]  PREVALITE 4 g packet Take 1 packet by mouth at bedtime. 01/18/22   [provider]    Inpatient Medications: Scheduled Meds:  budesonide  0.5 mg Nebulization BID   cholestyramine  4 g Oral QHS   diltiazem  240 mg Oral Daily   enoxaparin (LOVENOX) injection  0.5 mg/kg Subcutaneous Q24H   furosemide  40 mg Intravenous BID   pantoprazole  40 mg Oral Daily   potassium chloride SA  40 mEq Oral Q2000   rOPINIRole  1 mg Oral TID   sodium chloride flush  3 mL Intravenous Q12H   Continuous Infusions:  sodium chloride     PRN Meds: sodium chloride, acetaminophen, ipratropium-albuterol, ondansetron **OR** ondansetron (ZOFRAN) IV, sodium chloride flush  Allergies:    Allergies  Allergen Reactions   Nuvigil [Armodafinil] Hives   Penicillins Diarrhea and Nausea And Vomiting    Did it involve swelling of the face/tongue/throat, SOB, or low BP? no Did it involve sudden or severe rash/hives, skin peeling, or any reaction on the inside of your mouth or nose? No Did you need to seek medical attention at a hospital or doctor's office? No When did it last happen?  in her 65s    If all above answers are "NO", may proceed with cephalosporin  use.    Spironolactone Itching   Gabapentin Other (See Comments)    "wiped her out, couldn't stay awake"   Melatonin Other (See Comments)    "Brain fog"   Provigil [Modafinil] Hives   Entresto [Sacubitril-Valsartan] Itching   Lisinopril Cough    Social History:   Social History   Socioeconomic History   Marital status: Widowed    Spouse name: Not on file   Number of children: 2   Years of education: Not on file   Highest education level: Not on file  Occupational History   Not on file  Tobacco Use   Smoking status: Never   Smokeless tobacco: Never  Vaping Use   Vaping Use: Never used  Substance and Sexual Activity   Alcohol use: No    Alcohol/week: 0.0 standard drinks of alcohol   Drug use:  No   Sexual activity: Never  Other Topics Concern   Not on file  Social History Narrative   Pt lives with her son   Social Determinants of Health   Financial Resource Strain: Medium Risk (08/14/2021)   Overall Financial Resource Strain (CARDIA)    Difficulty of Paying Living Expenses: Somewhat hard  Food Insecurity: Food Insecurity Present (08/08/2021)   Hunger Vital Sign    Worried About Running Out of Food in the Last Year: Often true    Ran Out of Food in the Last Year: Often true  Transportation Needs: No Transportation Needs (01/24/2022)   PRAPARE - Hydrologist (Medical): No    Lack of Transportation (Non-Medical): No  Physical Activity: Inactive (08/08/2021)   Exercise Vital Sign    Days of Exercise per Week: 0 days    Minutes of Exercise per Session: 0 min  Stress: No Stress Concern Present (08/08/2021)   [place]    Feeling of Stress : Only a little  Social Connections: Socially Isolated (08/08/2021)   Social Connection and Isolation Panel [NHANES]    Frequency of Communication with Friends and Family: Once a week    Frequency of Social Gatherings with Friends and Family:  Never    Attends Religious Services: Never    Marine scientist or Organizations: No    Attends Archivist Meetings: Never    Marital Status: Widowed  Intimate Partner Violence: Not At Risk (01/24/2022)   Humiliation, Afraid, Rape, and Kick questionnaire    Fear of Current or Ex-Partner: No    Emotionally Abused: No    Physically Abused: No    Sexually Abused: No    Family History:    Family History  Problem Relation Age of Onset   Dementia Mother    COPD Father        51   Diabetes Father    Heart failure Brother    Coronary artery disease Brother 27       CABG   Heart failure Maternal Grandmother    Stroke Maternal Grandmother    Breast cancer Neg Hx      ROS:  Please see the history of present illness.  Review of Systems  Constitutional: Negative.   HENT: Negative.    Respiratory:  Positive for shortness of breath.   Cardiovascular: Negative.   Gastrointestinal: Negative.   Musculoskeletal: Negative.   Neurological: Negative.   Psychiatric/Behavioral: Negative.    All other systems reviewed and are negative.   Physical Exam/Data:   Vitals:   01/24/22 0600 01/24/22 0830 01/24/22 1038 01/24/22 1039  BP: 125/68 (!) 144/57 (!) 141/56   Pulse: 87 95 95   Resp: (!) 21 (!) 21 19   Temp: 98.2 F (36.8 C)   98.6 F (37 C)  TempSrc:    Oral  SpO2: 100% 100% 99%   Weight:      Height:        Intake/Output Summary (Last 24 hours) at 01/24/2022 1434 Last data filed at 01/23/2022 2223 Gross per 24 hour  Intake 250.54 ml  Output --  Net 250.54 ml      01/23/2022    2:55 PM 01/23/2022    2:22 PM 01/09/2022    2:41 PM  Last 3 Weights  Weight (lbs) 327 lb 327 lb 9.6 oz 333 lb 8 oz  Weight (kg) 148.326 kg 148.598 kg 151.275 kg  Body mass index is 56.13 kg/m.  General:  Well nourished, well developed, in no acute distress HEENT: normal Neck: no JVD Vascular: No carotid bruits; Distal pulses 2+ bilaterally Cardiac:  normal S1, S2; RRR; no  murmur Lungs:  clear to auscultation bilaterally, no wheezing, rhonchi or rales  Abd: soft, nontender, no hepatomegaly  Ext: no edema Musculoskeletal:  No deformities, BUE and BLE strength normal and equal Skin: warm and dry  Neuro:  CNs 2-12 intact, no focal abnormalities noted Psych:  Normal affect   EKG:  The EKG was personally reviewed and demonstrates:   Sinus tachycardia rate 114 bpm low voltage, no significant ST-T wave changes  Telemetry:  Telemetry was personally reviewed and demonstrates:   Normal sinus rhythm  Relevant CV Studies:   echo February 2023  1. Left ventricular ejection fraction, by estimation, is 55 to 60%. The  left ventricle has normal function. Left ventricular endocardial border  not optimally defined to evaluate regional wall motion. There is mild left  ventricular hypertrophy. Left  ventricular diastolic parameters are indeterminate.   2. Right ventricular systolic function is normal. The right ventricular  size is normal. Tricuspid regurgitation signal is inadequate for assessing  PA pressure.   3. The mitral valve is normal in structure. No evidence of mitral valve  regurgitation. No evidence of mitral stenosis.   4. The aortic valve is normal in structure. Aortic valve regurgitation is  not visualized. No aortic stenosis is present.   5. The inferior vena cava is normal in size with greater than 50%  respiratory variability, suggesting right atrial pressure of 3 mmHg.   6. Challenging image quality.   Laboratory Data:  High Sensitivity Troponin:   Recent Labs  Lab 01/23/22 1459 01/23/22 1801  TROPONINIHS 11 10     Chemistry Recent Labs  Lab 01/23/22 1459 01/24/22 0518  NA 136 136  K 3.2* 3.7  CL 92* 92*  CO2 31 35*  GLUCOSE 152* 116*  BUN 58* 58*  CREATININE 2.08* 2.31*  CALCIUM 10.0 9.0  GFRNONAA 25* 22*  ANIONGAP 13 9    No results for input(s): "PROT", "ALBUMIN", "AST", "ALT", "ALKPHOS", "BILITOT" in the last 168  hours. Lipids No results for input(s): "CHOL", "TRIG", "HDL", "LABVLDL", "LDLCALC", "CHOLHDL" in the last 168 hours.  Hematology Recent Labs  Lab 01/23/22 1459 01/24/22 0518  WBC 13.3* 13.2*  RBC 4.67 4.39  HGB 11.7* 10.8*  HCT 38.0 36.5  MCV 81.4 83.1  MCH 25.1* 24.6*  MCHC 30.8 29.6*  RDW 15.7* 15.6*  PLT 368 338   Thyroid No results for input(s): "TSH", "FREET4" in the last 168 hours.  BNP Recent Labs  Lab 01/23/22 1459  BNP 19.6    DDimer  Recent Labs  Lab 01/23/22 1801  DDIMER 1.09*     Radiology/Studies:  CT CHEST WO CONTRAST  Result Date: 01/23/2022 CLINICAL DATA:  Worsening shortness of breath. EXAM: CT CHEST WITHOUT CONTRAST TECHNIQUE: Multidetector CT imaging of the chest was performed following the standard protocol without IV contrast. RADIATION DOSE REDUCTION: This exam was performed according to the departmental dose-optimization program which includes automated exposure control, adjustment of the mA and/or kV according to patient size and/or use of iterative reconstruction technique. COMPARISON:  February 13, 2021 FINDINGS: Cardiovascular: There is mild to moderate severity calcification of the aortic arch, without evidence of aortic aneurysm. Normal heart size. No pericardial effusion. Mediastinum/Nodes: No enlarged mediastinal or axillary lymph nodes. Thyroid gland, trachea, and esophagus demonstrate no significant  findings. Lungs/Pleura: There is mild lingular, anterior right middle lobe and posterior right lower lobe linear atelectasis. There is no evidence of a pleural effusion or pneumothorax. Upper Abdomen: Multiple surgical clips are seen within the gallbladder fossa. Noninflamed diverticula are seen along the splenic flexure. Musculoskeletal: Multilevel degenerative changes are seen throughout the thoracic spine. IMPRESSION: 1. Mild lingular, anterior right middle lobe and posterior right lower lobe linear atelectasis. 2. Evidence of prior cholecystectomy.  3. Colonic diverticulosis. Aortic Atherosclerosis (ICD10-I70.0). Electronically Signed   By: Virgina Norfolk M.D.   On: 01/23/2022 20:40   DG Chest 2 View  Result Date: 01/23/2022 CLINICAL DATA:  Shortness of breath EXAM: CHEST - 2 VIEW COMPARISON:  Chest x-ray dated July 17, 2021 FINDINGS: Patient body habitus limits evaluation. Heart size and mediastinal contours are within normal limits. Mild bibasilar opacities. Linear opacity of the right lung base, similar to prior exam and likely due to scarring or atelectasis. Lungs otherwise clear. No pleural effusion or evidence of pneumothorax. IMPRESSION: Mild bibasilar opacities, likely due atelectasis. Electronically Signed   By: Yetta Glassman M.D.   On: 01/23/2022 15:25     Assessment and Plan:   Acute on chronic respiratory failure Multifactorial in the setting of morbid obesity, obesity hypoventilation syndrome, asthma/COPD overlap, chronic diastolic CHF, anemia -Grossly does not appear significantly fluid overloaded as she did in February 2023 when weight was 20 to 30 pounds higher than her baseline -Currently weight is at her baseline, no significant leg edema, minimal abdominal distention per the patient -Symptoms started after vomiting 6 days ago, scattered wheezing She is ambulating in the heat without oxygen, deconditioned at baseline -We will continue IV Lasix 40 twice daily in place of her oral torsemide Close monitoring of renal function, transition back to oral torsemide with metolazone as needed for any worsening renal dysfunction concerning for hypovolemia  COPD/asthma The setting of obesity hypoventilation On bronchodilators, steroids Flu COVID-negative  Chronic kidney disease stage IIIb Stable on outpatient diuretic regiment, torsemide daily with metolazone as needed   Total encounter time more than 80 minutes  Greater than 50% was spent in counseling and coordination of care with the patient   For questions or  updates, please contact [place] Please consult [email or web address] for contact info under    Signed, Ida Rogue, MD  01/24/2022 2:34 PM

## 2022-01-25 DIAGNOSIS — J9622 Acute and chronic respiratory failure with hypercapnia: Secondary | ICD-10-CM | POA: Diagnosis not present

## 2022-01-25 DIAGNOSIS — J9621 Acute and chronic respiratory failure with hypoxia: Secondary | ICD-10-CM | POA: Diagnosis not present

## 2022-01-25 LAB — BASIC METABOLIC PANEL
Anion gap: 10 (ref 5–15)
BUN: 60 mg/dL — ABNORMAL HIGH (ref 8–23)
CO2: 33 mmol/L — ABNORMAL HIGH (ref 22–32)
Calcium: 9 mg/dL (ref 8.9–10.3)
Chloride: 92 mmol/L — ABNORMAL LOW (ref 98–111)
Creatinine, Ser: 2.44 mg/dL — ABNORMAL HIGH (ref 0.44–1.00)
GFR, Estimated: 21 mL/min — ABNORMAL LOW (ref >60)
Glucose, Bld: 105 mg/dL — ABNORMAL HIGH (ref 70–99)
Potassium: 3.6 mmol/L (ref 3.5–5.1)
Sodium: 135 mmol/L (ref 135–145)

## 2022-01-25 LAB — CBC
HCT: 34.2 % — ABNORMAL LOW (ref 36.0–46.0)
Hemoglobin: 10.4 g/dL — ABNORMAL LOW (ref 12.0–15.0)
MCH: 24.7 pg — ABNORMAL LOW (ref 26.0–34.0)
MCHC: 30.4 g/dL (ref 30.0–36.0)
MCV: 81.2 fL (ref 80.0–100.0)
Platelets: 307 10*3/uL (ref 150–400)
RBC: 4.21 MIL/uL (ref 3.87–5.11)
RDW: 15.5 % (ref 11.5–15.5)
WBC: 11.7 10*3/uL — ABNORMAL HIGH (ref 4.0–10.5)
nRBC: 0 % (ref 0.0–0.2)

## 2022-01-25 MED ORDER — ALBUMIN HUMAN 25 % IV SOLN
25.0000 g | Freq: Two times a day (BID) | INTRAVENOUS | Status: AC
  Administered 2022-01-25 – 2022-01-27 (×5): 25 g via INTRAVENOUS
  Filled 2022-01-25 (×6): qty 100

## 2022-01-25 MED ORDER — PROMETHAZINE HCL 25 MG PO TABS
25.0000 mg | ORAL_TABLET | Freq: Four times a day (QID) | ORAL | Status: DC | PRN
  Administered 2022-01-25 – 2022-01-28 (×3): 25 mg via ORAL
  Filled 2022-01-25 (×4): qty 1

## 2022-01-25 MED ORDER — DEXTROSE 5 % IV SOLN
5.0000 mg/h | INTRAVENOUS | Status: DC
  Administered 2022-01-25 – 2022-01-31 (×5): 5 mg/h via INTRAVENOUS
  Filled 2022-01-25 (×4): qty 20

## 2022-01-25 MED ORDER — DM-GUAIFENESIN ER 30-600 MG PO TB12
1.0000 | ORAL_TABLET | Freq: Two times a day (BID) | ORAL | Status: DC
  Administered 2022-01-25 – 2022-02-01 (×14): 1 via ORAL
  Filled 2022-01-25 (×14): qty 1

## 2022-01-25 MED ORDER — POTASSIUM CHLORIDE CRYS ER 10 MEQ PO TBCR
40.0000 meq | EXTENDED_RELEASE_TABLET | Freq: Every day | ORAL | Status: DC
Start: 2022-01-25 — End: 2022-02-01
  Administered 2022-01-25 – 2022-01-31 (×7): 40 meq via ORAL
  Filled 2022-01-25 (×7): qty 4

## 2022-01-25 NOTE — Assessment & Plan Note (Signed)
At baseline on Trilogy/NIV with nocturnal oxygen.  Presented with dyspnea and room air spO2 88% at pulmonology visit.  Appears due to volume overload, improving with diuresis.  Asthma seems stable. --O2 per protocol, goal spO2 90-94% --Continue Trilogy when sleeping

## 2022-01-25 NOTE — Progress Notes (Signed)
Progress Note   Patient: Samantha Mason ZDG:[id number] DOB: 20-Aug-1950 DOA: 01/23/2022     2 DOS: the patient was seen and examined on 01/26/2022   Brief hospital course: 71 year old female with morbid obesity, obstructive sleep apnea on trilogy for the last 6 months, COPD on O2 as needed (uses only at night with Trilogy), chronic diastolic heart failure with EF of 55 to 60%, CKD stage IV was sent from the pulmonologist office for evaluation of worsening shortness of breath for 2 days.  She reports that she is short of breath all the time, both at rest and with activity.  She does not add any added salt to her cooking, takes her medications as prescribed including torsemide and PRN metolazone for 2 lb wt gain.  She reported recently lost 2 pounds.  Reports using trilogy NIV at home and was able to stay out of the hospital for some time.  She was sent to the ER from pulmonary clinic being found in respiratory distress with O2 saturation was 88% on room air in the ER.  Admitted to medicine service with pulmonology and cardiology consulted.  Started on IV Lasix pushes BID.  Creatinine worsened slightly.   Nephrology consulted and now on Lasix drip.   9/8:  Still requiring continuous O2 and with ongoing dyspnea on exertion much worse than baseline.   Consults: Pulmonology Cardiology Palliative care Neprhology   Assessment and Plan: * Acute on chronic respiratory failure with hypoxia and hypercapnia (HCC) At baseline on Trilogy/NIV with nocturnal oxygen.  Presented with dyspnea and room air spO2 88% at pulmonology visit.  Appears due to volume overload, improving with diuresis.  Asthma seems stable. --O2 per protocol, goal spO2 90-94% --Continue Trilogy when sleeping  Acute on chronic diastolic congestive heart failure (HCC) Acute hypoxia improving with diuresis, but volume status on exam difficult to assess.   --On Lasix drip --Nephrology following given CKD and recent worsening --Hold  home metolazone and torsemide --Low-sodium diet --Daily weights, I/O's --Monitor renal function and electrolytes   CKD (chronic kidney disease) stage 3, GFR 30-59 ml/min (HCC) AKI superimposed on CKD stage IIIb Renal function recently worsening. Cr on admission was 2.08 Recent baseline Cr 1.7 on 10/25/21.  --Nephrology following --Monitor BMP daily --Diuresis per nephro --On Lasix drip with IV albumin 25 mg BID to augment diuresis  Chronic asthmatic bronchitis (HCC) Stable Continue inhaled steroids and as needed bronchodilator therapy Continue oxygen supplementation at 2 L to maintain spO2 90-94%.  Essential hypertension Blood pressure is stable, soft diastolic. Continue Cardizem On Lasix drip  Hypokalemia K 3.2 on 9/8, replaced. Monitor BMP, replace K as needed.  Extreme obesity with alveolar hypoventilation (HCC) Continue Trilogy NIV when sleeping. Follow up with Pulmonology Wt loss recommended.  GERD (gastroesophageal reflux disease) Stable Continue PPI  OSA  Secondary to morbid obesity Continue trilogy at bedtime  Morbid obesity with BMI of 50.0-59.9, adult [facility]) Patient has a BMI of 59.56 Complicates overall prognosis and care Lifestyle modification and exercise has been discussed with her in detail  Hypoxia-resolved as of 01/25/2022 Patient with a history of COPD with chronic respiratory failure on 2 L of oxygen as needed, which she uses mostly at night Now has a continuous requirement with pulse oximetry of 88% Worsening hypoxia may be related to patient's underlying COPD as well as obesity hypoventilation syndrome She may need to be reassessed for  new oxygen need Pulmonology consult in a.m. for further recommendation Continue oxygen supplementation to maintain pulse oximetry greater  than 92%        Subjective: Pt up in recliner, son at bedside when seen today.  She notes improvement in her breathing but still far more short of breath than baseline.   Did well ambulating a ways down the hall yesterday but today got more short of breath just going to the bathroom.  Feels a bit tight, asking for neb treatment, but not wheezing.   Physical Exam: Vitals:   01/25/22 2033 01/25/22 2115 01/26/22 0532 01/26/22 0742  BP:  (!) 127/55 (!) 116/48 (!) 120/51  Pulse:  86 75 78  Resp:  '19 19 17  '$ Temp:  (!) 97.3 F (36.3 C) 97.8 F (36.6 C) 97.9 F (36.6 C)  TempSrc:      SpO2: 96% 90% 90% 95%  Weight:      Height:       General exam: awake, alert, no acute distress, obese HEENT: atraumatic, clear conjunctiva, anicteric sclera, moist mucus membranes, hearing grossly normal  Respiratory system: CTAB but diminished, no wheezes, normal respiratory effort at rest on 2 L/min [place] O2. Cardiovascular system: normal S1/S2, RRR, nonpitting lower extremity edema, unable to assess JVD Gastrointestinal system: soft, NT, ND Central nervous system: A&O x4. no gross focal neurologic deficits, normal speech Extremities: moves all, no edema, normal tone Skin: dry, intact, normal temperature Psychiatry: normal mood, congruent affect, judgement and insight appear normal   Data Reviewed:  Notable labs --- Cr 2.44 from 2.31, Cl 92, CO2 33, BUN 60, GFR 21, WBC 11.7 from 13.2, Hbg 10.4 from 10.8  Family Communication: son at bedside on rounds  Disposition: Status is: Inpatient Remains inpatient appropriate because: remains on IV diuresis, still requiring continuous oxygen only using nocturnal at baseline   Planned Discharge Destination: Home    Time spent: 45 minutes  Author: Ezekiel Slocumb, DO 01/26/2022 7:44 AM  For on call review [email or web address].

## 2022-01-25 NOTE — Hospital Course (Addendum)
71 year old female with morbid obesity, obstructive sleep apnea on trilogy for the last 6 months, COPD on O2 as needed (uses only at night with Trilogy), chronic diastolic heart failure with EF of 55 to 60%, CKD stage IV was sent from the pulmonologist office for evaluation of worsening shortness of breath for 2 days.  She reports that she is short of breath all the time, both at rest and with activity.  She does not add any added salt to her cooking, takes her medications as prescribed including torsemide and PRN metolazone for 2 lb wt gain.  She reported recently lost 2 pounds.  Reports using trilogy NIV at home and was able to stay out of the hospital for some time.  She was sent to the ER from pulmonary clinic being found in respiratory distress with O2 saturation was 88% on room air in the ER.  Admitted to medicine service with pulmonology and cardiology consulted.  Started on IV Lasix pushes BID.  Creatinine worsened slightly.   Nephrology consulted and now on Lasix drip.   9/8:  Still requiring continuous O2 and with ongoing dyspnea on exertion much worse than baseline.   Consults: Pulmonology Cardiology Palliative care Neprhology

## 2022-01-25 NOTE — Progress Notes (Signed)
[facility]  ROUNDING NOTE   Subjective:   Samantha Mason is a 71 year old female with past medical history including COPD with renal failure requiring 2 L oxygen as needed, chronic diastolic heart failure, sleep apnea, morbid obesity, hypertension, and chronic kidney disease stage IV.  Patient presents to the emergency department complaining of shortness of breath and has been admitted for SOB (shortness of breath) [R06.02] Hypoxia [R09.02]  Patient is known to our practice and is followed outpatient by Dr. Candiss Norse.  Patient was last seen in the office in March and has a follow-up appointment scheduled for later this year.  Patient states she was in her usual state of health and has progressively became short of breath over the past 2 to 3 days.  Does endorse a dry cough with no known fever or chills.  Reports shortness of breath at rest along with exertion.  Patient states leg swelling is a late sign for her.  No dizziness or lightheadedness.  Labs on ED arrival include sodium 136, potassium 3.2, glucose 152, creatinine 2.08 with GFR 25.  BUN also elevated 58.  Hemoglobin currently 11.7 with WBC 13.3.  Patient negative for influenza and COVID-19.  Chest x-ray shows mild bibasilar opacities likely atelectasis.  CT chest negative for acute changes.  We have been consulted to help manage fluid status.   Objective:  Vital signs in last 24 hours:  Temp:  [98.3 F (36.8 C)-98.8 F (37.1 C)] 98.4 F (36.9 C) (09/08 1118) Pulse Rate:  [82-89] 83 (09/08 1118) Resp:  [16-22] 22 (09/08 1118) BP: (105-142)/(44-58) 136/55 (09/08 1118) SpO2:  [95 %-98 %] 96 % (09/08 1118)  Weight change:  Filed Weights   01/23/22 1455  Weight: (!) 148.3 kg    Intake/Output: I/O last 3 completed shifts: In: 250.5 [IV Piggyback:250.5] Out: -    Intake/Output this shift:  Total I/O In: 360 [P.O.:360] Out: -   Physical Exam: General: NAD  Head: Normocephalic, atraumatic. Moist oral mucosal  membranes  Eyes: Anicteric  Lungs:  Diminished in bases, normal effort, 2 L O2  Heart: Regular rate and rhythm  Abdomen:  Soft, nontender, distended  Extremities: Trace peripheral edema.  Neurologic: Nonfocal, moving all four extremities  Skin: No lesions  Access: None    Basic Metabolic Panel: Recent Labs  Lab 01/23/22 1459 01/24/22 0518 01/25/22 0541  NA 136 136 135  K 3.2* 3.7 3.6  CL 92* 92* 92*  CO2 31 35* 33*  GLUCOSE 152* 116* 105*  BUN 58* 58* 60*  CREATININE 2.08* 2.31* 2.44*  CALCIUM 10.0 9.0 9.0    Liver Function Tests: No results for input(s): "AST", "ALT", "ALKPHOS", "BILITOT", "PROT", "ALBUMIN" in the last 168 hours. No results for input(s): "LIPASE", "AMYLASE" in the last 168 hours. No results for input(s): "AMMONIA" in the last 168 hours.  CBC: Recent Labs  Lab 01/23/22 1459 01/24/22 0518 01/25/22 0541  WBC 13.3* 13.2* 11.7*  HGB 11.7* 10.8* 10.4*  HCT 38.0 36.5 34.2*  MCV 81.4 83.1 81.2  PLT 368 338 307    Cardiac Enzymes: No results for input(s): "CKTOTAL", "CKMB", "CKMBINDEX", "TROPONINI" in the last 168 hours.  BNP: Invalid input(s): "POCBNP"  CBG: No results for input(s): "GLUCAP" in the last 168 hours.  Microbiology: Results for orders placed or performed during the hospital encounter of 01/23/22  Resp Panel by RT-PCR (Flu A&B, Covid) Anterior Nasal Swab     Status: None   Collection Time: 01/23/22  6:01 PM   Specimen:  Anterior Nasal Swab  Result Value Ref Range Status   SARS Coronavirus 2 by RT PCR NEGATIVE NEGATIVE Final    Comment: (NOTE) SARS-CoV-2 target nucleic acids are NOT DETECTED.  The SARS-CoV-2 RNA is generally detectable in upper respiratory specimens during the acute phase of infection. The lowest concentration of SARS-CoV-2 viral copies this assay can detect is 138 copies/mL. A negative result does not preclude SARS-Cov-2 infection and should not be used as the sole basis for treatment or other patient  management decisions. A negative result may occur with  improper specimen collection/handling, submission of specimen other than nasopharyngeal swab, presence of viral mutation(s) within the areas targeted by this assay, and inadequate number of viral copies(<138 copies/mL). A negative result must be combined with clinical observations, patient history, and epidemiological information. The expected result is Negative.  Fact Sheet for Patients:  EntrepreneurPulse.com.au  Fact Sheet for Healthcare Providers:  IncredibleEmployment.be  This test is no t yet approved or cleared by the [place] FDA and  has been authorized for detection and/or diagnosis of SARS-CoV-2 by FDA under an Emergency Use Authorization (EUA). This EUA will remain  in effect (meaning this test can be used) for the duration of the COVID-19 declaration under Section 564(b)(1) of the Act, 21 U.S.C.section 360bbb-3(b)(1), unless the authorization is terminated  or revoked sooner.       Influenza A by PCR NEGATIVE NEGATIVE Final   Influenza B by PCR NEGATIVE NEGATIVE Final    Comment: (NOTE) The Xpert Xpress SARS-CoV-2/FLU/RSV plus assay is intended as an aid in the diagnosis of influenza from Nasopharyngeal swab specimens and should not be used as a sole basis for treatment. Nasal washings and aspirates are unacceptable for Xpert Xpress SARS-CoV-2/FLU/RSV testing.  Fact Sheet for Patients: EntrepreneurPulse.com.au  Fact Sheet for Healthcare Providers: IncredibleEmployment.be  This test is not yet approved or cleared by the [place] FDA and has been authorized for detection and/or diagnosis of SARS-CoV-2 by FDA under an Emergency Use Authorization (EUA). This EUA will remain in effect (meaning this test can be used) for the duration of the COVID-19 declaration under Section 564(b)(1) of the Act, 21 U.S.C. section 360bbb-3(b)(1),  unless the authorization is terminated or revoked.  Performed at [facility], [address] [place], [place] [address]     Coagulation Studies: No results for input(s): "LABPROT", "INR" in the last 72 hours.  Urinalysis: No results for input(s): "COLORURINE", "LABSPEC", "PHURINE", "GLUCOSEU", "HGBUR", "BILIRUBINUR", "KETONESUR", "PROTEINUR", "UROBILINOGEN", "NITRITE", "LEUKOCYTESUR" in the last 72 hours.  Invalid input(s): "APPERANCEUR"    Imaging: CT CHEST WO CONTRAST  Result Date: 01/23/2022 CLINICAL DATA:  Worsening shortness of breath. EXAM: CT CHEST WITHOUT CONTRAST TECHNIQUE: Multidetector CT imaging of the chest was performed following the standard protocol without IV contrast. RADIATION DOSE REDUCTION: This exam was performed according to the departmental dose-optimization program which includes automated exposure control, adjustment of the mA and/or kV according to patient size and/or use of iterative reconstruction technique. COMPARISON:  February 13, 2021 FINDINGS: Cardiovascular: There is mild to moderate severity calcification of the aortic arch, without evidence of aortic aneurysm. Normal heart size. No pericardial effusion. Mediastinum/Nodes: No enlarged mediastinal or axillary lymph nodes. Thyroid gland, trachea, and esophagus demonstrate no significant findings. Lungs/Pleura: There is mild lingular, anterior right middle lobe and posterior right lower lobe linear atelectasis. There is no evidence of a pleural effusion or pneumothorax. Upper Abdomen: Multiple surgical clips are seen within the gallbladder fossa. Noninflamed diverticula are seen along the splenic  flexure. Musculoskeletal: Multilevel degenerative changes are seen throughout the thoracic spine. IMPRESSION: 1. Mild lingular, anterior right middle lobe and posterior right lower lobe linear atelectasis. 2. Evidence of prior cholecystectomy. 3. Colonic diverticulosis. Aortic Atherosclerosis (ICD10-I70.0).  Electronically Signed   By: Virgina Norfolk M.D.   On: 01/23/2022 20:40   DG Chest 2 View  Result Date: 01/23/2022 CLINICAL DATA:  Shortness of breath EXAM: CHEST - 2 VIEW COMPARISON:  Chest x-ray dated July 17, 2021 FINDINGS: Patient body habitus limits evaluation. Heart size and mediastinal contours are within normal limits. Mild bibasilar opacities. Linear opacity of the right lung base, similar to prior exam and likely due to scarring or atelectasis. Lungs otherwise clear. No pleural effusion or evidence of pneumothorax. IMPRESSION: Mild bibasilar opacities, likely due atelectasis. Electronically Signed   By: Yetta Glassman M.D.   On: 01/23/2022 15:25     Medications:    sodium chloride     albumin human     furosemide (LASIX) 200 mg in dextrose 5 % 100 mL (2 mg/mL) infusion 5 mg/hr (01/25/22 1104)    budesonide  0.5 mg Nebulization BID   cholestyramine  4 g Oral QHS   dextromethorphan-guaiFENesin  1 tablet Oral BID   diltiazem  240 mg Oral Daily   enoxaparin (LOVENOX) injection  0.5 mg/kg Subcutaneous Q24H   pantoprazole  40 mg Oral Daily   potassium chloride  40 mEq Oral Q2000   rOPINIRole  1 mg Oral TID   sodium chloride flush  3 mL Intravenous Q12H   sodium chloride, acetaminophen, ipratropium-albuterol, ondansetron **OR** ondansetron (ZOFRAN) IV, sodium chloride flush  Assessment/ Plan:  Ms. Samantha Mason is a 71 y.o.  female with past medical history including COPD with renal failure requiring 2 L oxygen as needed, chronic diastolic heart failure, sleep apnea, morbid obesity, hypertension, and chronic kidney disease stage IV.  Patient presents to the emergency department complaining of shortness of breath and has been admitted for SOB (shortness of breath) [R06.02] Hypoxia [R09.02]   Fluid volume overload with acute kidney injury on chronic kidney disease stage IIIb with baseline creatinine 1.7 on 10/25/21.  Acute kidney injury secondary to hypervolemia. Currently  receiving Furosemide '40mg'$  twice daily. Will transition to Furosemide '5mg'$ /hr with Albumin '25mg'$  twice daily to optimize fluid removal. Will continue to monitor renal function.    Lab Results  Component Value Date   CREATININE 2.44 (H) 01/25/2022   CREATININE 2.31 (H) 01/24/2022   CREATININE 2.08 (H) 01/23/2022    Intake/Output Summary (Last 24 hours) at 01/25/2022 1247 Last data filed at 01/25/2022 1000 Gross per 24 hour  Intake 360 ml  Output --  Net 360 ml   2. Hypertension with chronic kidney disease. Home regimen includes cardizem and torsemide. Torsemide held and patient to receiving Furosemide drip.   3. Anemia of chronic kidney disease Lab Results  Component Value Date   HGB 10.4 (L) 01/25/2022    Hgb at goal.   4. Secondary Hyperparathyroidism:  Lab Results  Component Value Date   CALCIUM 9.0 01/25/2022   PHOS 4.3 07/25/2021    Will monitor bone minerals during this admission.    LOS: 1   9/8/202312:47 PM

## 2022-01-25 NOTE — Assessment & Plan Note (Signed)
Acute hypoxia improving with diuresis, but volume status on exam difficult to assess.   --On Lasix drip --Nephrology following given CKD and recent worsening --Hold home metolazone and torsemide --Low-sodium diet --Daily weights, I/O's --Monitor renal function and electrolytes

## 2022-01-25 NOTE — Assessment & Plan Note (Signed)
Continue Trilogy NIV when sleeping. Follow up with Pulmonology Wt loss recommended.

## 2022-01-25 NOTE — Evaluation (Signed)
Physical Therapy Evaluation Patient Details Name: Samantha Mason MRN: [id number] DOB: 1951/03/28 Today's Date: 01/25/2022  History of Present Illness  Samantha Mason is a 71 y.o. female with a hx of morbid obesity, obesity hypoventilation syndrome on trilogy at home, chronic diastolic CHF, chronic kidney disease stage IV presenting from pulmonary clinic yesterday for worsening shortness of breath over the past several days  Clinical Impression  The pt presents this session in good spirits. She reports that she only uses oxygen at home intermittently. Pt ambulated with PT with lowest oxygen saturation noted at 90%, she quickly recovers to baseline of 94% on RA with standing rest break and education on pursed lip breathing. The pt presents with some balance deficits in which she states is her baseline. At this time the patient is demonstrating safety to return home with family care once medically stable.        Recommendations for follow up therapy are one component of a multi-disciplinary discharge planning process, led by the attending physician.  Recommendations may be updated based on patient status, additional functional criteria and insurance authorization.  Follow Up Recommendations No PT follow up      Assistance Recommended at Discharge Intermittent Supervision/Assistance  Patient can return home with the following  Help with stairs or ramp for entrance;Assist for transportation    Equipment Recommendations    Recommendations for Other Services       Functional Status Assessment Patient has had a recent decline in their functional status and demonstrates the ability to make significant improvements in function in a reasonable and predictable amount of time.     Precautions / Restrictions Precautions Precautions: Fall Restrictions Weight Bearing Restrictions: No      Mobility  Bed Mobility               General bed mobility comments: Pt in bedside chair on  arrival.    Transfers Overall transfer level: Needs assistance   Transfers: Sit to/from Stand Sit to Stand: Modified independent (Device/Increase time)                Ambulation/Gait Ambulation/Gait assistance: Supervision Gait Distance (Feet): 100 Feet Assistive device: None (Pt opting to hold onto railing in the hallway during gait.) Gait Pattern/deviations: Shuffle   Gait velocity interpretation: <1.31 ft/sec, indicative of household ambulator   General Gait Details: Ambulated on RA. Lowest O2 saturation reading was 90%.  Stairs            Wheelchair Mobility    Modified Rankin (Stroke Patients Only)       Balance Overall balance assessment: Needs assistance   Sitting balance-Leahy Scale: Good       Standing balance-Leahy Scale: Good                               Pertinent Vitals/Pain Pain Assessment Pain Assessment: No/denies pain    Home Living Family/patient expects to be discharged to:: Private residence Living Arrangements: Children Available Help at Discharge: Family;Available PRN/intermittently Type of Home: House Home Access: Stairs to enter Entrance Stairs-Rails: Right Entrance Stairs-Number of Steps: 3   Home Layout: One level Home Equipment: Advice worker (2 wheels);Cane - single point Additional Comments: has access to late mom's DME but does not use herself, lives with son    Prior Function Prior Level of Function : Independent/Modified Independent  Hand Dominance   Dominant Hand: Left    Extremity/Trunk Assessment   Upper Extremity Assessment Upper Extremity Assessment: Overall WFL for tasks assessed    Lower Extremity Assessment Lower Extremity Assessment: Overall WFL for tasks assessed       Communication   Communication: No difficulties  Cognition Arousal/Alertness: Awake/alert Behavior During Therapy: WFL for tasks assessed/performed Overall Cognitive  Status: Within Functional Limits for tasks assessed                                          General Comments      Exercises Other Exercises Other Exercises: Pt educated on pursed lip breathing to maintain oxygen saturation. Also educated to use [facility] as opposed to furniture surfing for safety. Pt reports understanding.   Assessment/Plan    PT Assessment Patient needs continued PT services  PT Problem List Decreased activity tolerance;Decreased mobility       PT Treatment Interventions Gait training;Functional mobility training;Balance training;Stair training    PT Goals (Current goals can be found in the Care Plan section)  Acute Rehab PT Goals Patient Stated Goal: to go home today PT Goal Formulation: With patient Time For Goal Achievement: 02/08/22 Potential to Achieve Goals: Good    Frequency Min 2X/week     Co-evaluation               AM-PAC PT "6 Clicks" Mobility  Outcome Measure Help needed turning from your back to your side while in a flat bed without using bedrails?: A Little Help needed moving from lying on your back to sitting on the side of a flat bed without using bedrails?: A Little Help needed moving to and from a bed to a chair (including a wheelchair)?: A Little Help needed standing up from a chair using your arms (e.g., wheelchair or bedside chair)?: A Little Help needed to walk in hospital room?: A Little Help needed climbing 3-5 steps with a railing? : A Little 6 Click Score: 18    End of Session   Activity Tolerance: Patient tolerated treatment well Patient left: in chair Nurse Communication: Mobility status PT Visit Diagnosis: Unsteadiness on feet (R26.81);Muscle weakness (generalized) (M62.81)    Time: 1443-1540 PT Time Calculation (min) (ACUTE ONLY): 21 min   Charges:   PT Evaluation $PT Eval Low Complexity: 1 Low PT Treatments $Gait Training: 8-22 mins        10:17 AM, 01/25/22 Saphire Barnhart A. Saverio Danker PT,  DPT Physical Therapist - [facility]   Carrell Palmatier A Perian Tedder 01/25/2022, 10:15 AM

## 2022-01-26 DIAGNOSIS — J019 Acute sinusitis, unspecified: Secondary | ICD-10-CM | POA: Diagnosis present

## 2022-01-26 DIAGNOSIS — J9621 Acute and chronic respiratory failure with hypoxia: Secondary | ICD-10-CM | POA: Diagnosis not present

## 2022-01-26 DIAGNOSIS — E876 Hypokalemia: Secondary | ICD-10-CM

## 2022-01-26 DIAGNOSIS — J9622 Acute and chronic respiratory failure with hypercapnia: Secondary | ICD-10-CM | POA: Diagnosis not present

## 2022-01-26 DIAGNOSIS — J329 Chronic sinusitis, unspecified: Secondary | ICD-10-CM | POA: Diagnosis present

## 2022-01-26 LAB — CBC
HCT: 32 % — ABNORMAL LOW (ref 36.0–46.0)
Hemoglobin: 9.8 g/dL — ABNORMAL LOW (ref 12.0–15.0)
MCH: 24.9 pg — ABNORMAL LOW (ref 26.0–34.0)
MCHC: 30.6 g/dL (ref 30.0–36.0)
MCV: 81.2 fL (ref 80.0–100.0)
Platelets: 282 10*3/uL (ref 150–400)
RBC: 3.94 MIL/uL (ref 3.87–5.11)
RDW: 15.3 % (ref 11.5–15.5)
WBC: 12.6 10*3/uL — ABNORMAL HIGH (ref 4.0–10.5)
nRBC: 0 % (ref 0.0–0.2)

## 2022-01-26 LAB — BASIC METABOLIC PANEL
Anion gap: 12 (ref 5–15)
BUN: 63 mg/dL — ABNORMAL HIGH (ref 8–23)
CO2: 30 mmol/L (ref 22–32)
Calcium: 9.2 mg/dL (ref 8.9–10.3)
Chloride: 92 mmol/L — ABNORMAL LOW (ref 98–111)
Creatinine, Ser: 2.4 mg/dL — ABNORMAL HIGH (ref 0.44–1.00)
GFR, Estimated: 21 mL/min — ABNORMAL LOW (ref >60)
Glucose, Bld: 118 mg/dL — ABNORMAL HIGH (ref 70–99)
Potassium: 3.5 mmol/L (ref 3.5–5.1)
Sodium: 134 mmol/L — ABNORMAL LOW (ref 135–145)

## 2022-01-26 LAB — MAGNESIUM: Magnesium: 1.9 mg/dL (ref 1.7–2.4)

## 2022-01-26 MED ORDER — ATROPINE SULFATE 1 MG/10ML IJ SOSY
PREFILLED_SYRINGE | INTRAMUSCULAR | Status: AC
  Filled 2022-01-26: qty 10

## 2022-01-26 MED ORDER — AZITHROMYCIN 500 MG PO TABS
500.0000 mg | ORAL_TABLET | Freq: Every day | ORAL | Status: AC
  Administered 2022-01-26: 500 mg via ORAL
  Filled 2022-01-26: qty 1

## 2022-01-26 MED ORDER — AZITHROMYCIN 500 MG PO TABS
250.0000 mg | ORAL_TABLET | Freq: Every day | ORAL | Status: AC
  Administered 2022-01-27 – 2022-01-30 (×4): 250 mg via ORAL
  Filled 2022-01-26 (×4): qty 1

## 2022-01-26 MED ORDER — BENZONATATE 100 MG PO CAPS
100.0000 mg | ORAL_CAPSULE | Freq: Three times a day (TID) | ORAL | Status: DC | PRN
  Administered 2022-01-26 – 2022-01-31 (×4): 100 mg via ORAL
  Filled 2022-01-26 (×4): qty 1

## 2022-01-26 NOTE — Plan of Care (Signed)
POOR PROGNOSIS  FOLLOW UP NEPHOLOGY RECS RECOMMEND DNR/DNI STATUS  NO FURTHER RECS   PCCM TO SIGN OFF

## 2022-01-26 NOTE — Assessment & Plan Note (Addendum)
Pt with sinus congestion, chills, leukocytosis. Started on Z-pack (would do Augmentin but PCN allergic).  Pt reports good response to prior sinus infections with Zithromax. Mucinex.  Claritin.

## 2022-01-26 NOTE — Assessment & Plan Note (Signed)
K 3.2 on 9/8, replaced. Monitor BMP, replace K as needed.

## 2022-01-26 NOTE — Progress Notes (Addendum)
Progress Note   Patient: Samantha Mason VWU:[id number] DOB: [birth year] DOA: 01/23/2022     2 DOS: the patient was seen and examined on 01/26/2022   Brief hospital course: 71 year old female with morbid obesity, obstructive sleep apnea on trilogy for the last 6 months, COPD on O2 as needed (uses only at night with Trilogy), chronic diastolic heart failure with EF of 55 to 60%, CKD stage IV was sent from the pulmonologist office for evaluation of worsening shortness of breath for 2 days.  She reports that she is short of breath all the time, both at rest and with activity.  She does not add any added salt to her cooking, takes her medications as prescribed including torsemide and PRN metolazone for 2 lb wt gain.  She reported recently lost 2 pounds.  Reports using trilogy NIV at home and was able to stay out of the hospital for some time.  She was sent to the ER from pulmonary clinic being found in respiratory distress with O2 saturation was 88% on room air in the ER.  Admitted to medicine service with pulmonology and cardiology consulted.  Started on IV Lasix pushes BID.  Creatinine worsened slightly.   Nephrology consulted and now on Lasix drip.   9/8:  Still requiring continuous O2 and with ongoing dyspnea on exertion much worse than baseline.   Consults: Pulmonology Cardiology Palliative care Neprhology   Assessment and Plan: * Acute on chronic respiratory failure with hypoxia and hypercapnia (HCC) At baseline on Trilogy/NIV with nocturnal oxygen.  Presented with dyspnea and room air spO2 88% at pulmonology visit.  Appears due to volume overload, improving with diuresis.  Asthma seems stable. --O2 per protocol, goal spO2 90-94% --Continue Trilogy when sleeping  Acute on chronic diastolic congestive heart failure (HCC) Acute hypoxia improving with diuresis, but volume status on exam difficult to assess.   --On Lasix drip --Nephrology following given CKD and recent worsening --Hold  home metolazone and torsemide --Low-sodium diet --Daily weights, I/O's --Monitor renal function and electrolytes   CKD (chronic kidney disease) stage 3, GFR 30-59 ml/min (HCC) AKI superimposed on CKD stage IIIb Renal function recently worsening. Cr on admission was 2.08 Recent baseline Cr 1.7 on 10/25/21.  --Nephrology following --Monitor BMP daily --Diuresis per nephro --On Lasix drip with IV albumin 25 mg BID to augment diuresis  Sinusitis Pt with sinus congestion, chills, leukocytosis. Start Z-pack (would do Augmentin but PCN allergic).  Pt reports good response to prior sinus infections with Zithromax. Mucinex.  Chronic asthmatic bronchitis (HCC) Stable Continue inhaled steroids and as needed bronchodilator therapy Continue oxygen supplementation at 2 L to maintain spO2 90-94%.  Essential hypertension Blood pressure is stable, soft diastolic. Continue Cardizem On Lasix drip  Hypokalemia K 3.2 on 9/8, replaced. Monitor BMP, replace K as needed.  Extreme obesity with alveolar hypoventilation (HCC) Continue Trilogy NIV when sleeping. Follow up with Pulmonology Wt loss recommended.  GERD (gastroesophageal reflux disease) Stable Continue PPI  OSA  Secondary to morbid obesity Continue trilogy at bedtime  Morbid obesity with BMI of 50.0-59.9, adult [facility]) Patient has a BMI of 29.56 Complicates overall prognosis and care Lifestyle modification and exercise has been discussed with her in detail  Hypoxia-resolved as of 01/25/2022 Patient with a history of COPD with chronic respiratory failure on 2 L of oxygen as needed, which she uses mostly at night Now has a continuous requirement with pulse oximetry of 88% Worsening hypoxia may be related to patient's underlying COPD as well as obesity  hypoventilation syndrome She may need to be reassessed for  new oxygen need Pulmonology consult in a.m. for further recommendation Continue oxygen supplementation to maintain pulse  oximetry greater than 92%        Subjective: Pt up in recliner when seen this AM.  Reports feeling somewhat better, dyspnea on exertion a little better today when ambulating to bathroom, but still worse than baseline.  Has sinus congestion and some episodes of chills.     Physical Exam: Vitals:   01/26/22 0742 01/26/22 0749 01/26/22 1220 01/26/22 1601  BP: (!) 120/51  (!) 133/57   Pulse: 78  85   Resp: 17  17   Temp: 97.9 F (36.6 C)  98.8 F (37.1 C)   TempSrc:      SpO2: 95% 92% 95% 96%  Weight:      Height:       General exam: awake, alert, no acute distress, obese HEENT: sounds congested, moist mucus membranes, hearing grossly normal  Respiratory system: CTAB but diminished, no wheezes, normal respiratory effort at rest, on room air Cardiovascular system: normal S1/S2, RRR, nonpitting lower extremity edema, unable to assess JVD Gastrointestinal system: soft, NT, ND Central nervous system: A&O x4. no gross focal neurologic deficits, normal speech Extremities: moves all, no edema, normal tone Skin: dry, intact, normal temperature Psychiatry: normal mood, congruent affect, judgement and insight appear normal   Data Reviewed:  Notable labs --- Cr 2.40 from 2.44, Cl 92, CO2 30, BUN 63, GFR 21, WBC 12.6 from 11.7, Hbg 9.8 from 10.4 , glucose 118  Family Communication: son at bedside on rounds  Disposition: Status is: Inpatient Remains inpatient appropriate because: remains on IV diuresis with Lasix drip    Planned Discharge Destination: Home    Time spent: 45 minutes  Author: Ezekiel Slocumb, DO 01/26/2022 4:26 PM  For on call review [email or web address].

## 2022-01-26 NOTE — Progress Notes (Signed)
[facility]  PROGRESS NOTE   Subjective:   Patient is out of bed to chair. Urine output is marginal.  On Lasix drip with albumin.  Objective:  Vital signs: Blood pressure (!) 133/57, pulse 85, temperature 98.8 F (37.1 C), resp. rate 17, height '5\' 4"'$  (1.626 m), weight (!) 148.3 kg, SpO2 95 %.  Intake/Output Summary (Last 24 hours) at 01/26/2022 1525 Last data filed at 01/26/2022 1423 Gross per 24 hour  Intake 100 ml  Output 950 ml  Net -850 ml   Filed Weights   01/23/22 1455  Weight: (!) 148.3 kg     Physical Exam: General:  No acute distress  Head:  Normocephalic, atraumatic. Moist oral mucosal membranes  Eyes:  Anicteric  Neck:  Supple  Lungs:   Clear to auscultation, normal effort  Heart:  S1S2 no rubs  Abdomen:   Soft, nontender, bowel sounds present  Extremities: 2+ peripheral edema.  Neurologic:  Awake, alert, following commands  Skin:  No lesions  Access:     Basic Metabolic Panel: Recent Labs  Lab 01/23/22 1459 01/24/22 0518 01/25/22 0541 01/26/22 0500  NA 136 136 135 134*  K 3.2* 3.7 3.6 3.5  CL 92* 92* 92* 92*  CO2 31 35* 33* 30  GLUCOSE 152* 116* 105* 118*  BUN 58* 58* 60* 63*  CREATININE 2.08* 2.31* 2.44* 2.40*  CALCIUM 10.0 9.0 9.0 9.2  MG  --   --   --  1.9    CBC: Recent Labs  Lab 01/23/22 1459 01/24/22 0518 01/25/22 0541 01/26/22 0500  WBC 13.3* 13.2* 11.7* 12.6*  HGB 11.7* 10.8* 10.4* 9.8*  HCT 38.0 36.5 34.2* 32.0*  MCV 81.4 83.1 81.2 81.2  PLT 368 338 307 282     Urinalysis: No results for input(s): "COLORURINE", "LABSPEC", "PHURINE", "GLUCOSEU", "HGBUR", "BILIRUBINUR", "KETONESUR", "PROTEINUR", "UROBILINOGEN", "NITRITE", "LEUKOCYTESUR" in the last 72 hours.  Invalid input(s): "APPERANCEUR"    Imaging: No results found.   Medications:    sodium chloride     albumin human Stopped (01/26/22 1424)   furosemide (LASIX) 200 mg in dextrose 5 % 100 mL (2 mg/mL) infusion 5 mg/hr (01/26/22 1428)    atropine        [START ON 01/27/2022] azithromycin  250 mg Oral Daily   budesonide  0.5 mg Nebulization BID   cholestyramine  4 g Oral QHS   dextromethorphan-guaiFENesin  1 tablet Oral BID   diltiazem  240 mg Oral Daily   enoxaparin (LOVENOX) injection  0.5 mg/kg Subcutaneous Q24H   pantoprazole  40 mg Oral Daily   potassium chloride  40 mEq Oral Q2000   rOPINIRole  1 mg Oral TID   sodium chloride flush  3 mL Intravenous Q12H    Assessment/ Plan:     Principal Problem:   Acute on chronic respiratory failure with hypoxia and hypercapnia (HCC) Active Problems:   Morbid obesity with BMI of 50.0-59.9, adult (HCC)   OSA    CKD (chronic kidney disease) stage 3, GFR 30-59 ml/min (HCC)   GERD (gastroesophageal reflux disease)   Essential hypertension   Chronic asthmatic bronchitis (HCC)   Acute on chronic diastolic congestive heart failure (HCC)   Extreme obesity with alveolar hypoventilation (HCC)   Hypokalemia  71 year old morbidly obese white female with history of obstructive sleep apnea on CPAP, chronic diastolic heart failure, hypertension, chronic kidney disease stage IIIb/IV now admitted with history of shortness of breath.  #1: Fluid overload/congestive heart failure: We will continue the furosemide drip  at 5 mg an hour as ordered by Dr. Zollie Scale.  We will also continue the SPA.  Patient is advised on importance of 2 g salt restricted diet and fluid restriction.  #2: Chronic kidney disease: CKD stage IIIb most likely secondary to chronic hypertensive nephrosclerosis.  This is probably contributed by decreased cardiac output leading to ischemic nephropathy.  We will continue to monitor closely.  #3: Anemia: Anemia possibly secondary to chronic kidney disease/iron deficiency.  We will continue to monitor.  Diet discussed with patient.  We will continue to monitor closely.   LOS: [place], MD [facility] 9/9/20233:25 PM

## 2022-01-27 DIAGNOSIS — Z6841 Body Mass Index (BMI) 40.0 and over, adult: Secondary | ICD-10-CM

## 2022-01-27 DIAGNOSIS — J9622 Acute and chronic respiratory failure with hypercapnia: Secondary | ICD-10-CM | POA: Diagnosis not present

## 2022-01-27 DIAGNOSIS — E662 Morbid (severe) obesity with alveolar hypoventilation: Secondary | ICD-10-CM | POA: Diagnosis not present

## 2022-01-27 DIAGNOSIS — Z9989 Dependence on other enabling machines and devices: Secondary | ICD-10-CM

## 2022-01-27 DIAGNOSIS — J9621 Acute and chronic respiratory failure with hypoxia: Secondary | ICD-10-CM | POA: Diagnosis not present

## 2022-01-27 DIAGNOSIS — I5033 Acute on chronic diastolic (congestive) heart failure: Secondary | ICD-10-CM | POA: Diagnosis not present

## 2022-01-27 DIAGNOSIS — N1832 Chronic kidney disease, stage 3b: Secondary | ICD-10-CM | POA: Diagnosis not present

## 2022-01-27 DIAGNOSIS — G4733 Obstructive sleep apnea (adult) (pediatric): Secondary | ICD-10-CM

## 2022-01-27 DIAGNOSIS — J9620 Acute and chronic respiratory failure, unspecified whether with hypoxia or hypercapnia: Secondary | ICD-10-CM | POA: Diagnosis not present

## 2022-01-27 LAB — BASIC METABOLIC PANEL
Anion gap: 12 (ref 5–15)
BUN: 64 mg/dL — ABNORMAL HIGH (ref 8–23)
CO2: 30 mmol/L (ref 22–32)
Calcium: 9.3 mg/dL (ref 8.9–10.3)
Chloride: 92 mmol/L — ABNORMAL LOW (ref 98–111)
Creatinine, Ser: 2.28 mg/dL — ABNORMAL HIGH (ref 0.44–1.00)
GFR, Estimated: 23 mL/min — ABNORMAL LOW (ref >60)
Glucose, Bld: 101 mg/dL — ABNORMAL HIGH (ref 70–99)
Potassium: 4 mmol/L (ref 3.5–5.1)
Sodium: 134 mmol/L — ABNORMAL LOW (ref 135–145)

## 2022-01-27 LAB — CBC
HCT: 31.6 % — ABNORMAL LOW (ref 36.0–46.0)
Hemoglobin: 9.9 g/dL — ABNORMAL LOW (ref 12.0–15.0)
MCH: 25.1 pg — ABNORMAL LOW (ref 26.0–34.0)
MCHC: 31.3 g/dL (ref 30.0–36.0)
MCV: 80.2 fL (ref 80.0–100.0)
Platelets: 300 10*3/uL (ref 150–400)
RBC: 3.94 MIL/uL (ref 3.87–5.11)
RDW: 15.4 % (ref 11.5–15.5)
WBC: 12.5 10*3/uL — ABNORMAL HIGH (ref 4.0–10.5)
nRBC: 0 % (ref 0.0–0.2)

## 2022-01-27 LAB — RETICULOCYTES
Immature Retic Fract: 17.1 % — ABNORMAL HIGH (ref 2.3–15.9)
RBC.: 3.94 MIL/uL (ref 3.87–5.11)
Retic Count, Absolute: 60.3 10*3/uL (ref 19.0–186.0)
Retic Ct Pct: 1.5 % (ref 0.4–3.1)

## 2022-01-27 LAB — IRON AND TIBC
Iron: 34 ug/dL (ref 28–170)
Saturation Ratios: 12 % (ref 10.4–31.8)
TIBC: 286 ug/dL (ref 250–450)
UIBC: 252 ug/dL

## 2022-01-27 LAB — FOLATE: Folate: 8.4 ng/mL (ref >5.9)

## 2022-01-27 LAB — VITAMIN B12: Vitamin B-12: 330 pg/mL (ref 180–914)

## 2022-01-27 LAB — FERRITIN: Ferritin: 37 ng/mL (ref 11–307)

## 2022-01-27 LAB — PROCALCITONIN: Procalcitonin: 0.16 ng/mL

## 2022-01-27 NOTE — Progress Notes (Signed)
[facility]  PROGRESS NOTE   Subjective:   On Lasix drip.  Had urine output of about 1.2 L yesterday.  Patient states she has been following the fluid restrictions.  Objective:  Vital signs: Blood pressure (!) 128/42, pulse 85, temperature 98.9 F (37.2 C), resp. rate 16, height '5\' 4"'$  (1.626 m), weight (!) 148.4 kg, SpO2 95 %.  Intake/Output Summary (Last 24 hours) at 01/27/2022 1434 Last data filed at 01/27/2022 1225 Gross per 24 hour  Intake 495.05 ml  Output 1200 ml  Net -704.95 ml   Filed Weights   01/23/22 1455 01/27/22 0500  Weight: (!) 148.3 kg (!) 148.4 kg     Physical Exam: General:  No acute distress  Head:  Normocephalic, atraumatic. Moist oral mucosal membranes  Eyes:  Anicteric  Neck:  Supple  Lungs:   Clear to auscultation, normal effort  Heart:  S1S2 no rubs  Abdomen:   Soft, nontender, bowel sounds present  Extremities: 2+ peripheral edema.  Neurologic:  Awake, alert, following commands  Skin:  No lesions  Access:     Basic Metabolic Panel: Recent Labs  Lab 01/23/22 1459 01/24/22 0518 01/25/22 0541 01/26/22 0500 01/27/22 0532  NA 136 136 135 134* 134*  K 3.2* 3.7 3.6 3.5 4.0  CL 92* 92* 92* 92* 92*  CO2 31 35* 33* 30 30  GLUCOSE 152* 116* 105* 118* 101*  BUN 58* 58* 60* 63* 64*  CREATININE 2.08* 2.31* 2.44* 2.40* 2.28*  CALCIUM 10.0 9.0 9.0 9.2 9.3  MG  --   --   --  1.9  --     CBC: Recent Labs  Lab 01/23/22 1459 01/24/22 0518 01/25/22 0541 01/26/22 0500 01/27/22 0532  WBC 13.3* 13.2* 11.7* 12.6* 12.5*  HGB 11.7* 10.8* 10.4* 9.8* 9.9*  HCT 38.0 36.5 34.2* 32.0* 31.6*  MCV 81.4 83.1 81.2 81.2 80.2  PLT 368 338 307 282 300     Urinalysis: No results for input(s): "COLORURINE", "LABSPEC", "PHURINE", "GLUCOSEU", "HGBUR", "BILIRUBINUR", "KETONESUR", "PROTEINUR", "UROBILINOGEN", "NITRITE", "LEUKOCYTESUR" in the last 72 hours.  Invalid input(s): "APPERANCEUR"    Imaging: No results found.   Medications:     sodium chloride     albumin human Stopped (01/27/22 1224)   furosemide (LASIX) 200 mg in dextrose 5 % 100 mL (2 mg/mL) infusion 5 mg/hr (01/27/22 1229)    azithromycin  250 mg Oral Daily   budesonide  0.5 mg Nebulization BID   cholestyramine  4 g Oral QHS   dextromethorphan-guaiFENesin  1 tablet Oral BID   diltiazem  240 mg Oral Daily   enoxaparin (LOVENOX) injection  0.5 mg/kg Subcutaneous Q24H   pantoprazole  40 mg Oral Daily   potassium chloride  40 mEq Oral Q2000   rOPINIRole  1 mg Oral TID   sodium chloride flush  3 mL Intravenous Q12H    Assessment/ Plan:     Principal Problem:   Acute on chronic respiratory failure with hypoxia and hypercapnia (HCC) Active Problems:   Morbid obesity with BMI of 50.0-59.9, adult (HCC)   OSA    CKD (chronic kidney disease) stage 3, GFR 30-59 ml/min (HCC)   GERD (gastroesophageal reflux disease)   Essential hypertension   Chronic asthmatic bronchitis (HCC)   Acute on chronic diastolic congestive heart failure (HCC)   Extreme obesity with alveolar hypoventilation (HCC)   Hypokalemia   Sinusitis  71 year old morbidly obese white female with history of obstructive sleep apnea on CPAP, chronic diastolic heart failure, hypertension, chronic kidney  disease stage IIIb/IV now admitted with history of shortness of breath.   #1: Fluid overload/congestive heart failure: We will continue the furosemide drip at 5 mg an hour as ordered by Dr. Zollie Scale.  May benefit from adding metolazone to the furosemide drip.  Patient is advised on importance of 2 g salt restricted diet and fluid restriction.   #2: Chronic kidney disease: CKD stage IIIb most likely secondary to chronic hypertensive nephrosclerosis.  This is probably contributed by decreased cardiac output leading to ischemic nephropathy.  We will continue to monitor closely.   #3: Anemia: Anemia possibly secondary to chronic kidney disease/iron deficiency.  We will continue to monitor.   Diet discussed  with patient.  We will continue to monitor closely.   LOS: [place], [facility] 9/10/20232:34 PM

## 2022-01-27 NOTE — Progress Notes (Signed)
Rounding Note    Patient Name: Samantha Mason Date of Encounter: 01/27/2022  [facility] Cardiologist: Nelva Bush, MD   Subjective    Reports symptoms of shortness of breath are improving, able to walk to the bathroom with less fatigue Rings on her hands still feel tight Denies significant leg swelling, abdominal distention Has been receiving albumin and Lasix infusions I/O not particularly accurate, she feels she is putting out 1 L/day No standing scale weights available  Inpatient Medications    Scheduled Meds:  azithromycin  250 mg Oral Daily   budesonide  0.5 mg Nebulization BID   cholestyramine  4 g Oral QHS   dextromethorphan-guaiFENesin  1 tablet Oral BID   diltiazem  240 mg Oral Daily   enoxaparin (LOVENOX) injection  0.5 mg/kg Subcutaneous Q24H   pantoprazole  40 mg Oral Daily   potassium chloride  40 mEq Oral Q2000   rOPINIRole  1 mg Oral TID   sodium chloride flush  3 mL Intravenous Q12H   Continuous Infusions:  sodium chloride     albumin human 25 g (01/27/22 1052)   furosemide (LASIX) 200 mg in dextrose 5 % 100 mL (2 mg/mL) infusion Stopped (01/26/22 2239)   PRN Meds: sodium chloride, acetaminophen, benzonatate, ipratropium-albuterol, promethazine, sodium chloride flush   Vital Signs    Vitals:   01/27/22 0344 01/27/22 0500 01/27/22 0726 01/27/22 0840  BP: 134/65   (!) 136/58  Pulse: 87   87  Resp: 18   16  Temp: 98.1 F (36.7 C)   98 F (36.7 C)  TempSrc:      SpO2: 97%  95% 92%  Weight:  (!) 148.4 kg    Height:        Intake/Output Summary (Last 24 hours) at 01/27/2022 1154 Last data filed at 01/27/2022 1026 Gross per 24 hour  Intake 508.43 ml  Output 1200 ml  Net -691.57 ml      01/27/2022    5:00 AM 01/23/2022    2:55 PM 01/23/2022    2:22 PM  Last 3 Weights  Weight (lbs) 327 lb 2.6 oz 327 lb 327 lb 9.6 oz  Weight (kg) 148.4 kg 148.326 kg 148.598 kg      Telemetry    Normal sinus rhythm- Personally  Reviewed  ECG     - Personally Reviewed  Physical Exam   GEN: No acute distress.   Neck: No JVD Cardiac: RRR, no murmurs, rubs, or gallops.  Respiratory: Clear to auscultation bilaterally. GI: Soft, nontender, non-distended  MS: No edema; No deformity. Neuro:  Nonfocal  Psych: Normal affect   Labs    High Sensitivity Troponin:   Recent Labs  Lab 01/23/22 1459 01/23/22 1801  TROPONINIHS 11 10     Chemistry Recent Labs  Lab 01/25/22 0541 01/26/22 0500 01/27/22 0532  NA 135 134* 134*  K 3.6 3.5 4.0  CL 92* 92* 92*  CO2 33* 30 30  GLUCOSE 105* 118* 101*  BUN 60* 63* 64*  CREATININE 2.44* 2.40* 2.28*  CALCIUM 9.0 9.2 9.3  MG  --  1.9  --   GFRNONAA 21* 21* 23*  ANIONGAP '10 12 12    '$ Lipids No results for input(s): "CHOL", "TRIG", "HDL", "LABVLDL", "LDLCALC", "CHOLHDL" in the last 168 hours.  Hematology Recent Labs  Lab 01/25/22 0541 01/26/22 0500 01/27/22 0532  WBC 11.7* 12.6* 12.5*  RBC 4.21 3.94 3.94  3.94  HGB 10.4* 9.8* 9.9*  HCT 34.2* 32.0* 31.6*  MCV  81.2 81.2 80.2  MCH 24.7* 24.9* 25.1*  MCHC 30.4 30.6 31.3  RDW 15.5 15.3 15.4  PLT 307 282 300   Thyroid No results for input(s): "TSH", "FREET4" in the last 168 hours.  BNP Recent Labs  Lab 01/23/22 1459  BNP 19.6    DDimer  Recent Labs  Lab 01/23/22 1801  DDIMER 1.09*     Radiology    No results found.  Cardiac Studies   Echo February 2023  1. Left ventricular ejection fraction, by estimation, is 55 to 60%. The  left ventricle has normal function. Left ventricular endocardial border  not optimally defined to evaluate regional wall motion. There is mild left  ventricular hypertrophy. Left  ventricular diastolic parameters are indeterminate.   2. Right ventricular systolic function is normal. The right ventricular  size is normal. Tricuspid regurgitation signal is inadequate for assessing  PA pressure.   3. The mitral valve is normal in structure. No evidence of mitral valve   regurgitation. No evidence of mitral stenosis.   4. The aortic valve is normal in structure. Aortic valve regurgitation is  not visualized. No aortic stenosis is present.   5. The inferior vena cava is normal in size with greater than 50%  respiratory variability, suggesting right atrial pressure of 3 mmHg.   Patient Profile     Samantha Mason is a 71 y.o. female with a hx of morbid obesity, obesity hypoventilation syndrome on trilogy at home, chronic diastolic CHF, chronic kidney disease stage IV presenting from pulmonary clinic yesterday for worsening shortness of breath over the past several days  Assessment & Plan    Acute on chronic respiratory failure Multifactorial in the setting of morbid obesity, obesity hypoventilation syndrome, asthma/COPD overlap, chronic diastolic CHF, anemia, chronic renal failure -Has been placed on Lasix and albumin infusion by nephrology Does not appear grossly fluid overloaded on clinical exam, BNP low transition back to oral torsemide with metolazone as needed at discharge -She is followed by outpatient CHF clinic   COPD/asthma The setting of obesity hypoventilation On bronchodilators, steroids Flu COVID-negative   Chronic kidney disease stage IIIb At discharge would resume torsemide 40 twice daily with metolazone as needed   Total encounter time more than 35 minutes  Greater than 50% was spent in counseling and coordination of care with the patient   For questions or updates, please contact [place] Please consult [email or web address] for contact info under        Signed, Ida Rogue, MD  01/27/2022, 11:54 AM

## 2022-01-27 NOTE — Progress Notes (Signed)
Progress Note   Patient: Samantha Mason FYB:[id number] DOB: 06-Sep-1950 DOA: 01/23/2022     3 DOS: the patient was seen and examined on 01/27/2022   Brief hospital course: 71 year old female with morbid obesity, obstructive sleep apnea on trilogy for the last 6 months, COPD on O2 as needed (uses only at night with Trilogy), chronic diastolic heart failure with EF of 55 to 60%, CKD stage IV was sent from the pulmonologist office for evaluation of worsening shortness of breath for 2 days.  She reports that she is short of breath all the time, both at rest and with activity.  She does not add any added salt to her cooking, takes her medications as prescribed including torsemide and PRN metolazone for 2 lb wt gain.  She reported recently lost 2 pounds.  Reports using trilogy NIV at home and was able to stay out of the hospital for some time.  She was sent to the ER from pulmonary clinic being found in respiratory distress with O2 saturation was 88% on room air in the ER.  Admitted to medicine service with pulmonology and cardiology consulted.  Started on IV Lasix pushes BID.  Creatinine worsened slightly.   Nephrology consulted and now on Lasix drip.   9/8:  still requiring continuous O2 and with ongoing dyspnea on exertion much worse than baseline. 9/9-10: remains on Lasix drip, renal function improving, on room air at rest now   Consults: Pulmonology Cardiology Palliative care Neprhology   Assessment and Plan: * Acute on chronic respiratory failure with hypoxia and hypercapnia (HCC) At baseline on Trilogy/NIV with nocturnal oxygen.  Presented with dyspnea and room air spO2 88% at pulmonology visit.  Appears due to volume overload, improving with diuresis.  Asthma seems stable. --O2 per protocol, goal spO2 90-94% --Continue Trilogy when sleeping  Acute on chronic diastolic congestive heart failure (HCC) Acute hypoxia improving with diuresis, but volume status on exam difficult to assess.    --On Lasix drip --Nephrology following given CKD and recent worsening --Hold home metolazone and torsemide --Low-sodium diet --Daily weights, I/O's --Monitor renal function and electrolytes   CKD (chronic kidney disease) stage 3, GFR 30-59 ml/min (HCC) AKI superimposed on CKD stage IIIb Renal function recently worsening. Cr on admission was 2.08 Recent baseline Cr 1.7 on 10/25/21.  --Nephrology following --Monitor BMP daily --Diuresis per nephro --On Lasix drip with IV albumin 25 mg BID to augment diuresis  Sinusitis Pt with sinus congestion, chills, leukocytosis. Start Z-pack (would do Augmentin but PCN allergic).  Pt reports good response to prior sinus infections with Zithromax. Mucinex.  Chronic asthmatic bronchitis (HCC) Stable Continue inhaled steroids and as needed bronchodilator therapy Continue oxygen supplementation at 2 L to maintain spO2 90-94%.  Essential hypertension Blood pressure is stable, soft diastolic. Continue Cardizem On Lasix drip  Hypokalemia K 3.2 on 9/8, replaced. Monitor BMP, replace K as needed.  Extreme obesity with alveolar hypoventilation (HCC) Continue Trilogy NIV when sleeping. Follow up with Pulmonology Wt loss recommended.  GERD (gastroesophageal reflux disease) Stable Continue PPI  OSA  Secondary to morbid obesity Continue trilogy at bedtime  Morbid obesity with BMI of 50.0-59.9, adult [name] has a BMI of 52.77 Complicates overall prognosis and care Lifestyle modification and exercise has been discussed with her in detail  Hypoxia-resolved as of 01/25/2022 Patient with a history of COPD with chronic respiratory failure on 2 L of oxygen as needed, which she uses mostly at night Now has a continuous requirement with pulse oximetry of  88% Worsening hypoxia may be related to patient's underlying COPD as well as obesity hypoventilation syndrome She may need to be reassessed for  new oxygen need Pulmonology consult in a.m.  for further recommendation Continue oxygen supplementation to maintain pulse oximetry greater than 92%        Subjective: Pt up in recliner when seen this AM.  Son at bedside. Pt reports improving dyspnea on exertion.  Rings still fitting tight.  Wants to know her true weight and how much change since admission.  Reports sinus congestion improved with zithromax.   Physical Exam: Vitals:   01/27/22 0840 01/27/22 1155 01/27/22 1543 01/27/22 1548  BP: (!) 136/58 (!) 128/42  (!) 140/61  Pulse: 87 85  88  Resp: '16 16  16  '$ Temp: 98 F (36.7 C) 98.9 F (37.2 C)  98.8 F (37.1 C)  TempSrc:      SpO2: 92% 95%  98%  Weight:   (!) 151.7 kg   Height:       General exam: awake, alert, no acute distress, obese HEENT: sounds congested, moist mucus membranes, hearing grossly normal  Respiratory system: CTAB but diminished, no wheezes, normal respiratory effort at rest, on room air Cardiovascular system: normal S1/S2, RRR, improved lower extremity edema, unable to assess JVD Central nervous system: A&O x4. no gross focal neurologic deficits, normal speech Extremities: moves all, no edema, normal tone Skin: dry, intact, normal temperature Psychiatry: normal mood, congruent affect, judgement and insight appear normal   Data Reviewed:  Notable labs --- Na 134, Cl 92, glucose 101, BUN 64, Cr 2.40>>2.28, GFR 23, normal iron panel, normal H85 and folic acid, procal 2.77 unchanged, WBC 12.5, Hbg 9.9, immature retic fract slightly elevated 17.1  Family Communication: son at bedside on rounds  Disposition: Status is: Inpatient Remains inpatient appropriate because: remains on IV diuresis with Lasix drip    Planned Discharge Destination: Home    Time spent: 45 minutes  Author: Ezekiel Slocumb, DO 01/27/2022 4:22 PM  For on call review [email or web address].

## 2022-01-27 NOTE — Progress Notes (Signed)
PT Cancellation Note  Patient Details Name: Samantha Mason MRN: [id number] DOB: 1950-09-25   Cancelled Treatment:    Reason Eval/Treat Not Completed: Other (comment)   Pt in chair.  Reports she is not feeling well today and does not want to try walking in hallway.  She is walking to/from bathroom on her own during the day and reports comfort doing so.  Will offer and encourage again tomorrow.   Chesley Noon 01/27/2022, 4:13 PM

## 2022-01-28 DIAGNOSIS — J9621 Acute and chronic respiratory failure with hypoxia: Secondary | ICD-10-CM | POA: Diagnosis not present

## 2022-01-28 DIAGNOSIS — I5033 Acute on chronic diastolic (congestive) heart failure: Secondary | ICD-10-CM | POA: Diagnosis not present

## 2022-01-28 DIAGNOSIS — J9622 Acute and chronic respiratory failure with hypercapnia: Secondary | ICD-10-CM | POA: Diagnosis not present

## 2022-01-28 LAB — BASIC METABOLIC PANEL
Anion gap: 11 (ref 5–15)
BUN: 66 mg/dL — ABNORMAL HIGH (ref 8–23)
CO2: 28 mmol/L (ref 22–32)
Calcium: 9.9 mg/dL (ref 8.9–10.3)
Chloride: 95 mmol/L — ABNORMAL LOW (ref 98–111)
Creatinine, Ser: 2.06 mg/dL — ABNORMAL HIGH (ref 0.44–1.00)
GFR, Estimated: 26 mL/min — ABNORMAL LOW (ref >60)
Glucose, Bld: 126 mg/dL — ABNORMAL HIGH (ref 70–99)
Potassium: 4.2 mmol/L (ref 3.5–5.1)
Sodium: 134 mmol/L — ABNORMAL LOW (ref 135–145)

## 2022-01-28 NOTE — Care Management Important Message (Signed)
Important Message  Patient Details  Name: Samantha Mason MRN: [id number] Date of Birth: 12-23-1950   Medicare Important Message Given:  Yes     Loann Quill 01/28/2022, 3:29 PM

## 2022-01-28 NOTE — Progress Notes (Signed)
Rounding Note    Patient Name: Samantha Mason Date of Encounter: 01/28/2022  [facility] Cardiologist: Nelva Bush, MD   Subjective   Patient seen on AM rounds. Denies any chest pain and states that breathing is slowly improving. She is able to move around the move with less shortness of breath and fatigue. -704 mls output in the last 24 hours. Continued on lasix drip at 2.5 mg/hr.  Inpatient Medications    Scheduled Meds:  azithromycin  250 mg Oral Daily   budesonide  0.5 mg Nebulization BID   cholestyramine  4 g Oral QHS   dextromethorphan-guaiFENesin  1 tablet Oral BID   diltiazem  240 mg Oral Daily   enoxaparin (LOVENOX) injection  0.5 mg/kg Subcutaneous Q24H   pantoprazole  40 mg Oral Daily   potassium chloride  40 mEq Oral Q2000   rOPINIRole  1 mg Oral TID   sodium chloride flush  3 mL Intravenous Q12H   Continuous Infusions:  sodium chloride     albumin human 25 g (01/27/22 2340)   furosemide (LASIX) 200 mg in dextrose 5 % 100 mL (2 mg/mL) infusion 5 mg/hr (01/27/22 1229)   PRN Meds: sodium chloride, acetaminophen, benzonatate, ipratropium-albuterol, promethazine, sodium chloride flush   Vital Signs    Vitals:   01/28/22 0125 01/28/22 0412 01/28/22 0603 01/28/22 0724  BP: (!) 133/44 (!) 126/43    Pulse: 89 88    Resp: 18 18    Temp: 98.4 F (36.9 C) 98.6 F (37 C)    TempSrc:      SpO2: 96% 96%  94%  Weight:   (!) 151.1 kg   Height:        Intake/Output Summary (Last 24 hours) at 01/28/2022 0743 Last data filed at 01/27/2022 1900 Gross per 24 hour  Intake 495.05 ml  Output 1200 ml  Net -704.95 ml      01/28/2022    6:03 AM 01/27/2022    3:43 PM 01/27/2022    5:00 AM  Last 3 Weights  Weight (lbs) 333 lb 1.8 oz 334 lb 8 oz 327 lb 2.6 oz  Weight (kg) 151.1 kg 151.728 kg 148.4 kg      Telemetry    Sinus rates in the 80's - Personally Reviewed  ECG    No new tracings - Personally Reviewed  Physical Exam   GEN: No acute  distress.   Neck: No JVD Cardiac: RRR, no murmurs, rubs, or gallops.  Respiratory: Clear to auscultation bilaterally. Respirations are unlabored on room air at rest.After ambulation she uses her O2 via [place] at 2L until recovery GI: Soft, nontender, non-distended  MS: No edema; No deformity. Neuro:  Nonfocal  Psych: Normal affect   Labs    High Sensitivity Troponin:   Recent Labs  Lab 01/23/22 1459 01/23/22 1801  TROPONINIHS 11 10     Chemistry Recent Labs  Lab 01/25/22 0541 01/26/22 0500 01/27/22 0532  NA 135 134* 134*  K 3.6 3.5 4.0  CL 92* 92* 92*  CO2 33* 30 30  GLUCOSE 105* 118* 101*  BUN 60* 63* 64*  CREATININE 2.44* 2.40* 2.28*  CALCIUM 9.0 9.2 9.3  MG  --  1.9  --   GFRNONAA 21* 21* 23*  ANIONGAP '10 12 12    '$ Lipids No results for input(s): "CHOL", "TRIG", "HDL", "LABVLDL", "LDLCALC", "CHOLHDL" in the last 168 hours.  Hematology Recent Labs  Lab 01/25/22 0541 01/26/22 0500 01/27/22 0532  WBC 11.7* 12.6* 12.5*  RBC 4.21 3.94 3.94  3.94  HGB 10.4* 9.8* 9.9*  HCT 34.2* 32.0* 31.6*  MCV 81.2 81.2 80.2  MCH 24.7* 24.9* 25.1*  MCHC 30.4 30.6 31.3  RDW 15.5 15.3 15.4  PLT 307 282 300   Thyroid No results for input(s): "TSH", "FREET4" in the last 168 hours.  BNP Recent Labs  Lab 01/23/22 1459  BNP 19.6    DDimer  Recent Labs  Lab 01/23/22 1801  DDIMER 1.09*     Radiology    No results found.  Cardiac Studies  Echocardiogram completed on 07/16/21  1. Left ventricular ejection fraction, by estimation, is 55 to 60%. The  left ventricle has normal function. Left ventricular endocardial border  not optimally defined to evaluate regional wall motion. There is mild left  ventricular hypertrophy. Left  ventricular diastolic parameters are indeterminate.   2. Right ventricular systolic function is normal. The right ventricular  size is normal. Tricuspid regurgitation signal is inadequate for assessing  PA pressure.   3. The mitral valve is normal in  structure. No evidence of mitral valve  regurgitation. No evidence of mitral stenosis.   4. The aortic valve is normal in structure. Aortic valve regurgitation is  not visualized. No aortic stenosis is present.   5. The inferior vena cava is normal in size with greater than 50%  respiratory variability, suggesting right atrial pressure of 3 mmHg.   6. Challenging image quality.   Patient Profile     71 y.o. female with a history of morbid obesity, obesity hypoventilation syndrome on trilogy at home, chronic diastolic CHF, chronic kidney disease stage IV, who is being seen and evaluated for worsening shortness of breath.  Assessment & Plan    Acute on chronic respiratory failure -multifactorial in the setting of morbid obesity, obesity hypoventilation syndrome, asthma/COPD, chronic diastolic CHF, anemia, and chronic renal failure -nephrology has been giving albumin and lasix infusions -weight is closer to baseline, no standing weights are available  -does not appear to be volume overloaded -close monitoring of renal function while receiving diuretic therapy  -continued on azithromycin, mucinex, and Pulmicort  Chronic diastolic congestive heart failure -LVEF 55-60% 2/23 -diuretic therapy per nephrology -BNP 19.6 -daily weight/I&O, low sodium diet -daily bmp  COPD/asthma -in the setting of obesity hypoventilation -continue bronchodilators and steroids -flu/covid negative -management per IM  Chronic kidney disease stage IIIb -serum creatinine 2.06 -monitor urine output -monitor/trend/replete electrolytes as needed -daily bmp     For questions or updates, please contact [place] Please consult [email or web address] for contact info under        Signed, Solyana Nonaka, NP  01/28/2022, 7:43 AM

## 2022-01-28 NOTE — Progress Notes (Signed)
Physical Therapy Treatment Patient Details Name: Samantha Mason MRN: [id number] DOB: Oct 06, 1950 Today's Date: 01/28/2022   History of Present Illness Samantha Mason is a 71 y.o. female with a hx of morbid obesity, obesity hypoventilation syndrome on trilogy at home, chronic diastolic CHF, chronic kidney disease stage IV presenting from pulmonary clinic yesterday for worsening shortness of breath over the past several days    PT Comments    Pt ready for session.  Stated she is feeling better today.  Stands with no assist but does opt to use RW for support today.  C/O R foot pain - top.  Stated no injury just sore from not walking much lately and has occasional pain.   She is able to walk about 25' in hallway before becoming fatigued and is encouraged to turn by Probation officer.  Stops to rest in hallway in standing and c/o some dizziness but reports being ok to walk back.  Returns to chair.  O2 90% and HR 95.  Reported dizziness being 7/10 and stated she feels it is because she pushed her walking today.  Education and self awareness of limitations discussed and she voices understanding and voices "not being able to do what I used to."  Encouragement given.  She does at times when walking use 1 hand on RW and 1 hand on handrail in hallway and discussed safety with AD.   Pt stated she has a RW at home from her mother but asked about rollator for home and community use to have a chair available.  Is not interested in a wheelchair at this time.  Will discuss with TOC to see if a rollator is available through insurance.  She would need a bariatric size.  Pt in agreement.   Recommendations for follow up therapy are one component of a multi-disciplinary discharge planning process, led by the attending physician.  Recommendations may be updated based on patient status, additional functional criteria and insurance authorization.  Follow Up Recommendations  No PT follow up     Assistance Recommended at  Discharge Intermittent Supervision/Assistance  Patient can return home with the following Help with stairs or ramp for entrance;Assist for transportation   Equipment Recommendations  Rollator (4 wheels) (bariatric)    Recommendations for Other Services       Precautions / Restrictions Precautions Precautions: Fall Restrictions Weight Bearing Restrictions: No     Mobility  Bed Mobility               General bed mobility comments: Pt in bedside chair on arrival.    Transfers Overall transfer level: Needs assistance   Transfers: Sit to/from Stand Sit to Stand: Modified independent (Device/Increase time)                Ambulation/Gait Ambulation/Gait assistance: Supervision Gait Distance (Feet): 60 Feet Assistive device: Rolling walker (2 wheels) Gait Pattern/deviations: Step-through pattern, Decreased step length - right, Decreased step length - left, Decreased weight shift to right Gait velocity: decreased     General Gait Details: opts to use RW today but at times uses 1 hand on RW and 1 on rail in hallway.   Stairs             Wheelchair Mobility    Modified Rankin (Stroke Patients Only)       Balance Overall balance assessment: Needs assistance Sitting-balance support: Feet supported Sitting balance-Leahy Scale: Good     Standing balance support: Bilateral upper extremity supported Standing balance-Leahy Scale: Good  Cognition Arousal/Alertness: Awake/alert Behavior During Therapy: WFL for tasks assessed/performed Overall Cognitive Status: Within Functional Limits for tasks assessed                                          Exercises      General Comments        Pertinent Vitals/Pain Pain Assessment Pain Assessment: Faces Faces Pain Scale: Hurts little more Pain Location: top R foot - reports no inury and has "occasional" aches and pains. Pain Descriptors /  Indicators: Sore Pain Intervention(s): Monitored during session    Home Living                          Prior Function            PT Goals (current goals can now be found in the care plan section) Progress towards PT goals: Progressing toward goals    Frequency    Min 2X/week      PT Plan Current plan remains appropriate    Co-evaluation              AM-PAC PT "6 Clicks" Mobility   Outcome Measure  Help needed turning from your back to your side while in a flat bed without using bedrails?: None Help needed moving from lying on your back to sitting on the side of a flat bed without using bedrails?: None Help needed moving to and from a bed to a chair (including a wheelchair)?: None Help needed standing up from a chair using your arms (e.g., wheelchair or bedside chair)?: None Help needed to walk in hospital room?: None Help needed climbing 3-5 steps with a railing? : A Little 6 Click Score: 23    End of Session Equipment Utilized During Treatment: Gait belt Activity Tolerance: Patient limited by fatigue Patient left: in chair Nurse Communication: Mobility status PT Visit Diagnosis: Unsteadiness on feet (R26.81);Muscle weakness (generalized) (M62.81)     Time: 8088-1103 PT Time Calculation (min) (ACUTE ONLY): 13 min  Charges:  $Gait Training: 8-22 mins                   Chesley Noon, PTA 01/28/22, 10:16 AM

## 2022-01-28 NOTE — Progress Notes (Signed)
Progress Note   Patient: Samantha Mason WUJ:[id number] DOB: 29-Aug-1950 DOA: 01/23/2022     5 DOS: the patient was seen and examined on 01/29/2022   Brief hospital course: 71 year old female with morbid obesity, obstructive sleep apnea on trilogy for the last 6 months, COPD on O2 as needed (uses only at night with Trilogy), chronic diastolic heart failure with EF of 55 to 60%, CKD stage IV was sent from the pulmonologist office for evaluation of worsening shortness of breath for 2 days.  She reports that she is short of breath all the time, both at rest and with activity.  She does not add any added salt to her cooking, takes her medications as prescribed including torsemide and PRN metolazone for 2 lb wt gain.  She reported recently lost 2 pounds.  Reports using trilogy NIV at home and was able to stay out of the hospital for some time.  She was sent to the ER from pulmonary clinic being found in respiratory distress with O2 saturation was 88% on room air in the ER.  Admitted to medicine service with pulmonology and cardiology consulted.  Started on IV Lasix pushes BID.  Creatinine worsened slightly.   Nephrology consulted and now on Lasix drip.   9/8:  still requiring continuous O2 and with ongoing dyspnea on exertion much worse than baseline. 9/9-10: remains on Lasix drip, renal function improving, on room air at rest now   Consults: Pulmonology Cardiology Palliative care Neprhology   Assessment and Plan: * Acute on chronic respiratory failure with hypoxia and hypercapnia (HCC) At baseline on Trilogy/NIV with nocturnal oxygen.  Presented with dyspnea and room air spO2 88% at pulmonology visit.  Appears due to volume overload, improving with diuresis.  Asthma seems stable. --O2 per protocol, goal spO2 90-94% --Continue Trilogy when sleeping  Acute on chronic diastolic congestive heart failure (HCC) Acute hypoxia improving with diuresis, but volume status on exam difficult to assess.    --On Lasix drip --Nephrology following given CKD and recent worsening --Hold home metolazone and torsemide --Low-sodium diet --Daily weights, I/O's --Monitor renal function and electrolytes   CKD (chronic kidney disease) stage 3, GFR 30-59 ml/min (HCC) AKI superimposed on CKD stage IIIb Renal function recently worsening. Cr on admission was 2.08 Recent baseline Cr 1.7 on 10/25/21.  --Nephrology following --Monitor BMP daily --Diuresis per nephro --On Lasix drip with IV albumin 25 mg BID to augment diuresis  Acute sinusitis Pt with sinus congestion, chills, leukocytosis. Start Z-pack (would do Augmentin but PCN allergic).  Pt reports good response to prior sinus infections with Zithromax. Mucinex.  Chronic asthmatic bronchitis (HCC) Stable Continue inhaled steroids and as needed bronchodilator therapy Continue oxygen supplementation at 2 L to maintain spO2 90-94%.  Essential hypertension Blood pressure is stable, soft diastolic. Continue Cardizem On Lasix drip  Hypokalemia K 3.2 on 9/8, replaced. Monitor BMP, replace K as needed.  Extreme obesity with alveolar hypoventilation (HCC) Continue Trilogy NIV when sleeping. Follow up with Pulmonology Wt loss recommended.  GERD (gastroesophageal reflux disease) Stable Continue PPI  OSA  Secondary to morbid obesity Continue trilogy at bedtime  Morbid obesity with BMI of 50.0-59.9, adult [facility]) Patient has a BMI of 95.62 Complicates overall prognosis and care Lifestyle modification and exercise has been discussed with her in detail  Hypoxia-resolved as of 01/25/2022 Patient with a history of COPD with chronic respiratory failure on 2 L of oxygen as needed, which she uses mostly at night Now has a continuous requirement with pulse oximetry  of 88% Worsening hypoxia may be related to patient's underlying COPD as well as obesity hypoventilation syndrome She may need to be reassessed for  new oxygen need Pulmonology consult  in a.m. for further recommendation Continue oxygen supplementation to maintain pulse oximetry greater than 92%        Subjective: Pt up in recliner when seen this AM.  She reports dyspnea improving.  Pedal edema has changed much and rings still too tight, this usually improves with adequate diuresis in the past per pt.  Congestion improving.  Dyspnea on exertion improving. No other acute complaints.     Physical Exam: Vitals:   01/29/22 0059 01/29/22 0121 01/29/22 0612 01/29/22 0632  BP: (!) 118/52  (!) 136/40   Pulse: 83  87   Resp: 18     Temp: 99.3 F (37.4 C)  98.9 F (37.2 C)   TempSrc:      SpO2: 95% 92% 97%   Weight:    (!) 151.5 kg  Height:       General exam: awake, alert, no acute distress, obese HEENT: moist mucus membranes, hearing grossly normal, sounds less congested Respiratory system: CTAB but diminished, no wheezes, normal respiratory effort at rest, on room air Cardiovascular system: normal S1/S2, RRR, stable  lower extremity nonpitting edema, unable to assess JVD Central nervous system: A&O x4. no gross focal neurologic deficits, normal speech Extremities: moves all, no edema, normal tone Skin: dry, intact, normal temperature Psychiatry: normal mood, congruent affect, judgement and insight appear normal   Data Reviewed:  Notable labs --- Na 134, Cl 95, glucose 126, BUN 64, Cr 2.40>>2.28>>2.06, GFR 26  Family Communication: son at bedside on rounds 9/10  Disposition: Status is: Inpatient Remains inpatient appropriate because: remains on IV diuresis with Lasix drip    Planned Discharge Destination: Home    Time spent: 35 minutes  Author: Ezekiel Slocumb, DO 01/29/2022 7:40 AM  For on call review [email or web address].

## 2022-01-28 NOTE — Progress Notes (Signed)
[facility]  PROGRESS NOTE   Subjective:   Patient seen sitting up in chair, continues to complain of shortness of breath with exertion, 2 L [place] Alert and oriented Appetite remains appropriate Patient appears frustrated with weight gain States she is normally given metolazone in these situations to help increase fluid output.  Creatinine 2.28 Urine output 1.2 L in preceding 24 hours  Objective:  Vital signs: Blood pressure (!) 150/42, pulse 91, temperature 98.7 F (37.1 C), resp. rate 16, height '5\' 4"'$  (1.626 m), weight (!) 151.5 kg, SpO2 97 %.  Intake/Output Summary (Last 24 hours) at 01/28/2022 1416 Last data filed at 01/28/2022 1000 Gross per 24 hour  Intake 720 ml  Output --  Net 720 ml    Filed Weights   01/27/22 1543 01/28/22 0603 01/28/22 1245  Weight: (!) 151.7 kg (!) 151.1 kg (!) 151.5 kg     Physical Exam: General:  No acute distress  Head:  Normocephalic, atraumatic. Moist oral mucosal membranes  Eyes:  Anicteric  Lungs:  Diminished in bases, normal effort  Heart:  S1S2 no rubs  Abdomen:   Soft, nontender, bowel sounds present  Extremities: 2+ peripheral edema.  Neurologic:  Awake, alert, following commands  Skin:  No lesions  Access: None    Basic Metabolic Panel: Recent Labs  Lab 01/24/22 0518 01/25/22 0541 01/26/22 0500 01/27/22 0532 01/28/22 0859  NA 136 135 134* 134* 134*  K 3.7 3.6 3.5 4.0 4.2  CL 92* 92* 92* 92* 95*  CO2 35* 33* '30 30 28  '$ GLUCOSE 116* 105* 118* 101* 126*  BUN 58* 60* 63* 64* 66*  CREATININE 2.31* 2.44* 2.40* 2.28* 2.06*  CALCIUM 9.0 9.0 9.2 9.3 9.9  MG  --   --  1.9  --   --      CBC: Recent Labs  Lab 01/23/22 1459 01/24/22 0518 01/25/22 0541 01/26/22 0500 01/27/22 0532  WBC 13.3* 13.2* 11.7* 12.6* 12.5*  HGB 11.7* 10.8* 10.4* 9.8* 9.9*  HCT 38.0 36.5 34.2* 32.0* 31.6*  MCV 81.4 83.1 81.2 81.2 80.2  PLT 368 338 307 282 300      Urinalysis: No results for input(s): "COLORURINE", "LABSPEC",  "PHURINE", "GLUCOSEU", "HGBUR", "BILIRUBINUR", "KETONESUR", "PROTEINUR", "UROBILINOGEN", "NITRITE", "LEUKOCYTESUR" in the last 72 hours.  Invalid input(s): "APPERANCEUR"    Imaging: No results found.   Medications:    sodium chloride     furosemide (LASIX) 200 mg in dextrose 5 % 100 mL (2 mg/mL) infusion 5 mg/hr (01/27/22 1229)    azithromycin  250 mg Oral Daily   budesonide  0.5 mg Nebulization BID   cholestyramine  4 g Oral QHS   dextromethorphan-guaiFENesin  1 tablet Oral BID   diltiazem  240 mg Oral Daily   enoxaparin (LOVENOX) injection  0.5 mg/kg Subcutaneous Q24H   pantoprazole  40 mg Oral Daily   potassium chloride  40 mEq Oral Q2000   rOPINIRole  1 mg Oral TID   sodium chloride flush  3 mL Intravenous Q12H    Assessment/ Plan:     Principal Problem:   Acute on chronic respiratory failure with hypoxia and hypercapnia (HCC) Active Problems:   Morbid obesity with BMI of 50.0-59.9, adult (HCC)   OSA    CKD (chronic kidney disease) stage 3, GFR 30-59 ml/min (HCC)   GERD (gastroesophageal reflux disease)   Essential hypertension   Chronic asthmatic bronchitis (HCC)   Acute on chronic diastolic congestive heart failure (HCC)   Extreme obesity with alveolar hypoventilation (  [place])   Hypokalemia   Sinusitis  71 year old morbidly obese white female with history of obstructive sleep apnea on CPAP, chronic diastolic heart failure, hypertension, chronic kidney disease stage IIIb/IV now admitted with history of shortness of breath.   #1: Fluid overload/congestive heart failure: We will continue the furosemide drip at 5 mg an hour as ordered by Dr. Holley Raring.  Patient states she has taken metolazone in the past.  We will consider low-dose metolazone 2.5 mg twice weekly.   #2: Chronic kidney disease: CKD stage IIIb most likely secondary to chronic hypertensive nephrosclerosis.  Renal function stable at this time.  We will continue to monitor with diuretic therapy.   #3: Anemia:  Anemia possibly secondary to chronic kidney disease/iron deficiency.  Hemoglobin stable.     LOS: [facility] 9/11/20232:16 PM

## 2022-01-28 NOTE — Plan of Care (Signed)

## 2022-01-29 DIAGNOSIS — I5033 Acute on chronic diastolic (congestive) heart failure: Secondary | ICD-10-CM | POA: Diagnosis not present

## 2022-01-29 DIAGNOSIS — J9622 Acute and chronic respiratory failure with hypercapnia: Secondary | ICD-10-CM | POA: Diagnosis not present

## 2022-01-29 DIAGNOSIS — J9621 Acute and chronic respiratory failure with hypoxia: Secondary | ICD-10-CM | POA: Diagnosis not present

## 2022-01-29 LAB — BASIC METABOLIC PANEL
Anion gap: 10 (ref 5–15)
BUN: 67 mg/dL — ABNORMAL HIGH (ref 8–23)
CO2: 29 mmol/L (ref 22–32)
Calcium: 9.6 mg/dL (ref 8.9–10.3)
Chloride: 98 mmol/L (ref 98–111)
Creatinine, Ser: 2.01 mg/dL — ABNORMAL HIGH (ref 0.44–1.00)
GFR, Estimated: 26 mL/min — ABNORMAL LOW (ref >60)
Glucose, Bld: 101 mg/dL — ABNORMAL HIGH (ref 70–99)
Potassium: 4.4 mmol/L (ref 3.5–5.1)
Sodium: 137 mmol/L (ref 135–145)

## 2022-01-29 LAB — CBC
HCT: 30.2 % — ABNORMAL LOW (ref 36.0–46.0)
Hemoglobin: 9.4 g/dL — ABNORMAL LOW (ref 12.0–15.0)
MCH: 24.8 pg — ABNORMAL LOW (ref 26.0–34.0)
MCHC: 31.1 g/dL (ref 30.0–36.0)
MCV: 79.7 fL — ABNORMAL LOW (ref 80.0–100.0)
Platelets: 288 10*3/uL (ref 150–400)
RBC: 3.79 MIL/uL — ABNORMAL LOW (ref 3.87–5.11)
RDW: 15.9 % — ABNORMAL HIGH (ref 11.5–15.5)
WBC: 9.9 10*3/uL (ref 4.0–10.5)
nRBC: 0 % (ref 0.0–0.2)

## 2022-01-29 MED ORDER — LORATADINE 10 MG PO TABS
10.0000 mg | ORAL_TABLET | Freq: Every day | ORAL | Status: DC
  Administered 2022-01-29 – 2022-01-31 (×3): 10 mg via ORAL
  Filled 2022-01-29 (×4): qty 1

## 2022-01-29 MED ORDER — METOLAZONE 5 MG PO TABS
5.0000 mg | ORAL_TABLET | Freq: Once | ORAL | Status: AC
  Administered 2022-01-29: 5 mg via ORAL
  Filled 2022-01-29: qty 1

## 2022-01-29 NOTE — Progress Notes (Signed)
Mobility Specialist - Progress Note   01/29/22 1130  Mobility  Activity Ambulated with assistance in hallway  Level of Assistance Standby assist, set-up cues, supervision of patient - no hands on  Assistive Device Front wheel walker  Distance Ambulated (ft) 100 ft  Activity Response Tolerated well  $Mobility charge 1 Mobility   Pt sitting in recliner on RA upon arrival. Pt STS indep and ambulates in hallway SBA. Pt needs two standing rest breaks (1 half way and 1 on the way back) but motivated to ambulate farther than yesterday. Pt returns to recliner with needs in reach.   Gretchen Short  Mobility Specialist  01/29/22 11:33 AM

## 2022-01-29 NOTE — Progress Notes (Signed)
PT Cancellation Note  Patient Details Name: Samantha Mason MRN: [id number] DOB: 06/24/1950   Cancelled Treatment:     Pt reports she has been walking to BR today multiple times, also worked with mobility tech earlier in day, would like to rest now and finish her lunch.   3:20 PM, 01/29/22 Samantha Mason, PT, DPT Physical Therapist - [facility]  [phone] ([place])    [place] C 01/29/2022, 3:19 PM

## 2022-01-29 NOTE — Progress Notes (Signed)
Rounding Note    Patient Name: Samantha Mason Date of Encounter: 01/29/2022  [facility] Cardiologist: Nelva Bush, MD   Subjective   I/Os not recorded, patient reports she put out 1L. Scr/Bun stable. She reports at rest she feels fine, but with any movement she feels short of breath and may need to put on O2. No chest pain.   Inpatient Medications    Scheduled Meds:  azithromycin  250 mg Oral Daily   budesonide  0.5 mg Nebulization BID   cholestyramine  4 g Oral QHS   dextromethorphan-guaiFENesin  1 tablet Oral BID   diltiazem  240 mg Oral Daily   enoxaparin (LOVENOX) injection  0.5 mg/kg Subcutaneous Q24H   pantoprazole  40 mg Oral Daily   potassium chloride  40 mEq Oral Q2000   rOPINIRole  1 mg Oral TID   sodium chloride flush  3 mL Intravenous Q12H   Continuous Infusions:  sodium chloride     furosemide (LASIX) 200 mg in dextrose 5 % 100 mL (2 mg/mL) infusion 5 mg/hr (01/27/22 1229)   PRN Meds: sodium chloride, acetaminophen, benzonatate, ipratropium-albuterol, promethazine, sodium chloride flush   Vital Signs    Vitals:   01/29/22 0121 01/29/22 0612 01/29/22 0632 01/29/22 0758  BP:  (!) 136/40    Pulse:  87    Resp:      Temp:  98.9 F (37.2 C)    TempSrc:      SpO2: 92% 97%  91%  Weight:   (!) 151.5 kg   Height:        Intake/Output Summary (Last 24 hours) at 01/29/2022 0801 Last data filed at 01/28/2022 1400 Gross per 24 hour  Intake 720 ml  Output --  Net 720 ml      01/29/2022    6:32 AM 01/28/2022   12:45 PM 01/28/2022    6:03 AM  Last 3 Weights  Weight (lbs) 334 lb 334 lb 333 lb 1.8 oz  Weight (kg) 151.5 kg 151.5 kg 151.1 kg      Telemetry    NSR, HR 70-80s - Personally Reviewed  ECG    No new - Personally Reviewed  Physical Exam   GEN: No acute distress.   Neck: No JVD Cardiac: RRR, no murmurs, rubs, or gallops.  Respiratory: Clear to auscultation bilaterally. GI: Soft, nontender, non-distended  MS: No edema;  No deformity. Neuro:  Nonfocal  Psych: Normal affect   Labs    High Sensitivity Troponin:   Recent Labs  Lab 01/23/22 1459 01/23/22 1801  TROPONINIHS 11 10     Chemistry Recent Labs  Lab 01/26/22 0500 01/27/22 0532 01/28/22 0859 01/29/22 0551  NA 134* 134* 134* 137  K 3.5 4.0 4.2 4.4  CL 92* 92* 95* 98  CO2 '30 30 28 29  '$ GLUCOSE 118* 101* 126* 101*  BUN 63* 64* 66* 67*  CREATININE 2.40* 2.28* 2.06* 2.01*  CALCIUM 9.2 9.3 9.9 9.6  MG 1.9  --   --   --   GFRNONAA 21* 23* 26* 26*  ANIONGAP '12 12 11 10    '$ Lipids No results for input(s): "CHOL", "TRIG", "HDL", "LABVLDL", "LDLCALC", "CHOLHDL" in the last 168 hours.  Hematology Recent Labs  Lab 01/26/22 0500 01/27/22 0532 01/29/22 0551  WBC 12.6* 12.5* 9.9  RBC 3.94 3.94  3.94 3.79*  HGB 9.8* 9.9* 9.4*  HCT 32.0* 31.6* 30.2*  MCV 81.2 80.2 79.7*  MCH 24.9* 25.1* 24.8*  MCHC 30.6 31.3 31.1  RDW 15.3 15.4 15.9*  PLT 282 300 288   Thyroid No results for input(s): "TSH", "FREET4" in the last 168 hours.  BNP Recent Labs  Lab 01/23/22 1459  BNP 19.6    DDimer  Recent Labs  Lab 01/23/22 1801  DDIMER 1.09*     Radiology    No results found.  Cardiac Studies   Echocardiogram completed on 07/16/21  1. Left ventricular ejection fraction, by estimation, is 55 to 60%. The  left ventricle has normal function. Left ventricular endocardial border  not optimally defined to evaluate regional wall motion. There is mild left  ventricular hypertrophy. Left  ventricular diastolic parameters are indeterminate.   2. Right ventricular systolic function is normal. The right ventricular  size is normal. Tricuspid regurgitation signal is inadequate for assessing  PA pressure.   3. The mitral valve is normal in structure. No evidence of mitral valve  regurgitation. No evidence of mitral stenosis.   4. The aortic valve is normal in structure. Aortic valve regurgitation is  not visualized. No aortic stenosis is present.    5. The inferior vena cava is normal in size with greater than 50%  respiratory variability, suggesting right atrial pressure of 3 mmHg.   6. Challenging image quality.   Patient Profile     71 y.o. female with a history of morbid obesity, obesity hypoventilation syndrome on trilogy at home, chronic diastolic CHF, chronic kidney disease stage IV, who is being seen and evaluated for worsening shortness of breath.  Assessment & Plan    Acute on chronic respiratory failure with hypoxia and hypercapnia OHS/OSA - multifactorial given morbid obesity, OHS, asthma, COPD, chronic diastolic heart failure, anemia - she is off O2 at rest, but feels very short of breath on exertion. AT home patient was only using CPAP at night.  - IV lasix drip per nephrology - s/p albumin - she is using Trilogy/NIV when sleeping - patient may need RHC. Hgb stable.  Acute on chronic diastolic CHF - LVEF 26-83% - BNP was 19.6 - IV lasix drip '5mg'$ /hr  per nephrology - PTA torsemide and metolazone - I/Os not recorded, but she reports negative 1L. Breathing is overall improved - Difficult to evaluate volume given body habitus. Trace lower leg edema noted  COPD/Asthma - stable - per IM  CKD stage 3 - Scr on admission 2.8 - Scr improving, today 2.01/67   For questions or updates, please contact [place] Please consult [email or web address] for contact info under        Signed, Teea Ducey Ninfa Meeker, PA-C  01/29/2022, 8:01 AM

## 2022-01-29 NOTE — Progress Notes (Signed)
Progress Note   Patient: Samantha Mason XTG:[id number] DOB: 1950-10-08 DOA: 01/23/2022     5 DOS: the patient was seen and examined on 01/29/2022   Brief hospital course: 71 year old female with morbid obesity, obstructive sleep apnea on trilogy for the last 6 months, COPD on O2 as needed (uses only at night with Trilogy), chronic diastolic heart failure with EF of 55 to 60%, CKD stage IV was sent from the pulmonologist office for evaluation of worsening shortness of breath for 2 days.  She reports that she is short of breath all the time, both at rest and with activity.  She does not add any added salt to her cooking, takes her medications as prescribed including torsemide and PRN metolazone for 2 lb wt gain.  She reported recently lost 2 pounds.  Reports using trilogy NIV at home and was able to stay out of the hospital for some time.  She was sent to the ER from pulmonary clinic being found in respiratory distress with O2 saturation was 88% on room air in the ER.  Admitted to medicine service with pulmonology and cardiology consulted.  Started on IV Lasix pushes BID.  Creatinine worsened slightly.   Nephrology consulted and now on Lasix drip.   9/8:  still requiring continuous O2 and with ongoing dyspnea on exertion much worse than baseline. 9/9-10: remains on Lasix drip, renal function improving, on room air at rest now   Consults: Pulmonology Cardiology Palliative care Neprhology   Assessment and Plan: * Acute on chronic respiratory failure with hypoxia and hypercapnia (HCC) At baseline on Trilogy/NIV with nocturnal oxygen.  Presented with dyspnea and room air spO2 88% at pulmonology visit.  Appears due to volume overload, improving with diuresis.  Asthma seems stable. --O2 per protocol, goal spO2 90-94% --Continue Trilogy when sleeping  Acute on chronic diastolic congestive heart failure (HCC) Acute hypoxia improving with diuresis, but volume status on exam difficult to assess.    --On Lasix drip --Metolazone 5 mg given today --Nephrology following given baseline CKD and recent worsening --Cardiology also following, plan for right heart cath tomorrow --Hold home metolazone and torsemide --Low-sodium diet, fluid restriction --Daily weights, I/O's --Monitor renal function and electrolytes   CKD (chronic kidney disease) stage 3, GFR 30-59 ml/min (HCC) AKI superimposed on CKD stage IIIb Renal function recently worsening. Cr on admission was 2.08 Recent baseline Cr 1.7 on 10/25/21.  Creatinine initially increased to 2.4, since improving, 2.01 today --Nephrology following --Monitor BMP daily --Diuresis per nephro --On Lasix drip with IV albumin 25 mg BID to augment diuresis -- 5 mg metolazone given today  Acute sinusitis Pt with sinus congestion, chills, leukocytosis. Started on Z-pack (would do Augmentin but PCN allergic).  Pt reports good response to prior sinus infections with Zithromax. Mucinex.  Claritin.  Chronic asthmatic bronchitis (HCC) Stable Continue inhaled steroids and as needed bronchodilator therapy Continue oxygen supplementation at 2 L to maintain spO2 90-94%.  Essential hypertension Blood pressure is stable, soft diastolic. Continue Cardizem On Lasix drip  Hypokalemia K 3.2 on 9/8, replaced. Monitor BMP, replace K as needed.  Extreme obesity with alveolar hypoventilation (HCC) Continue Trilogy NIV when sleeping. Follow up with Pulmonology Wt loss recommended.  GERD (gastroesophageal reflux disease) Stable Continue PPI  OSA  Secondary to morbid obesity Continue trilogy at bedtime  Morbid obesity with BMI of 50.0-59.9, adult [facility]) Patient has a BMI of 27.03 Complicates overall prognosis and care Lifestyle modification and exercise has been discussed with her in detail  Hypoxia-resolved as of 01/25/2022 Patient with a history of COPD with chronic respiratory failure on 2 L of oxygen as needed, which she uses mostly at  night Now has a continuous requirement with pulse oximetry of 88% Worsening hypoxia may be related to patient's underlying COPD as well as obesity hypoventilation syndrome She may need to be reassessed for  new oxygen need Pulmonology consult in a.m. for further recommendation Continue oxygen supplementation to maintain pulse oximetry greater than 92%        Subjective: Pt up in recliner when seen this AM.  Reports still having some dyspnea with exertion but recovers more quickly.  Swelling in her feet feels a little bit improved.  Ring still fitting more tightly than normal.  Denies wheezing or shortness of breath at rest.  Mentions cardiology doing cath procedure tomorrow.   Physical Exam: Vitals:   01/29/22 0758 01/29/22 0815 01/29/22 1159 01/29/22 1332  BP:  125/63 (!) 125/49   Pulse:  80 83   Resp:  18 16   Temp:  98.6 F (37 C) (!) 97.5 F (36.4 C)   TempSrc:  Oral Oral   SpO2: 91% 97%  95%  Weight:      Height:       General exam: awake, alert, no acute distress, obese HEENT: moist mucus membranes, hearing grossly normal, sounds less congested Respiratory system: CTA B, no expiratory wheezes, normal respiratory effort at rest, on room air Cardiovascular system: normal S1/S2, RRR, bilateral pedal edema improving, unable to assess JVD Central nervous system: A&O x4. no gross focal neurologic deficits, normal speech Extremities: moves all, no edema, normal tone Skin: dry, intact, normal temperature Psychiatry: normal mood, congruent affect, judgement and insight appear normal   Data Reviewed:  Notable labs --- creatinine improved 2.01, BUN 67.  CBC with hemoglobin 9.4 from 9.9.    Family Communication: son at bedside on rounds 9/10  Disposition: Status is: Inpatient Remains inpatient appropriate because: remains on IV diuresis with Lasix drip, ongoing evaluation     Planned Discharge Destination: Home    Time spent: 35 minutes  Author: Ezekiel Slocumb,  DO 01/29/2022 2:48 PM  For on call review [email or web address].

## 2022-01-29 NOTE — Progress Notes (Addendum)
[facility]  PROGRESS NOTE   Subjective:   Patient seen sitting up in chair, weaned to room air States she has shortness of breath on exertion Lower extremity edema improved,however weight remained the same.  Patient states she drinks at least 64 ounces of water per the recommendation of her previous physician.  Patient has maintained to 64 ounces during this admission.  Creatinine 2.01 Weight remains the same  Objective:  Vital signs: Blood pressure (!) 125/49, pulse 83, temperature (!) 97.5 F (36.4 C), temperature source Oral, resp. rate 16, height '5\' 4"'$  (1.626 m), weight (!) 151.5 kg, SpO2 95 %.  Intake/Output Summary (Last 24 hours) at 01/29/2022 1421 Last data filed at 01/29/2022 3329 Gross per 24 hour  Intake --  Output 800 ml  Net -800 ml    Filed Weights   01/28/22 0603 01/28/22 1245 01/29/22 5188  Weight: (!) 151.1 kg (!) 151.5 kg (!) 151.5 kg     Physical Exam: General:  No acute distress  Head:  Normocephalic, atraumatic. Moist oral mucosal membranes  Eyes:  Anicteric  Lungs:  Diminished in bases, normal effort  Heart:  S1S2 no rubs  Abdomen:   Soft, nontender, bowel sounds present  Extremities: 1+ peripheral edema.  Neurologic:  Awake, alert, following commands  Skin:  No lesions  Access: None    Basic Metabolic Panel: Recent Labs  Lab 01/25/22 0541 01/26/22 0500 01/27/22 0532 01/28/22 0859 01/29/22 0551  NA 135 134* 134* 134* 137  K 3.6 3.5 4.0 4.2 4.4  CL 92* 92* 92* 95* 98  CO2 33* '30 30 28 29  '$ GLUCOSE 105* 118* 101* 126* 101*  BUN 60* 63* 64* 66* 67*  CREATININE 2.44* 2.40* 2.28* 2.06* 2.01*  CALCIUM 9.0 9.2 9.3 9.9 9.6  MG  --  1.9  --   --   --      CBC: Recent Labs  Lab 01/24/22 0518 01/25/22 0541 01/26/22 0500 01/27/22 0532 01/29/22 0551  WBC 13.2* 11.7* 12.6* 12.5* 9.9  HGB 10.8* 10.4* 9.8* 9.9* 9.4*  HCT 36.5 34.2* 32.0* 31.6* 30.2*  MCV 83.1 81.2 81.2 80.2 79.7*  PLT 338 307 282 300 288       Urinalysis: No results for input(s): "COLORURINE", "LABSPEC", "PHURINE", "GLUCOSEU", "HGBUR", "BILIRUBINUR", "KETONESUR", "PROTEINUR", "UROBILINOGEN", "NITRITE", "LEUKOCYTESUR" in the last 72 hours.  Invalid input(s): "APPERANCEUR"    Imaging: No results found.   Medications:    sodium chloride     furosemide (LASIX) 200 mg in dextrose 5 % 100 mL (2 mg/mL) infusion 5 mg/hr (01/27/22 1229)    azithromycin  250 mg Oral Daily   budesonide  0.5 mg Nebulization BID   cholestyramine  4 g Oral QHS   dextromethorphan-guaiFENesin  1 tablet Oral BID   diltiazem  240 mg Oral Daily   enoxaparin (LOVENOX) injection  0.5 mg/kg Subcutaneous Q24H   loratadine  10 mg Oral Daily   pantoprazole  40 mg Oral Daily   potassium chloride  40 mEq Oral Q2000   rOPINIRole  1 mg Oral TID   sodium chloride flush  3 mL Intravenous Q12H    Assessment/ Plan:     Principal Problem:   Acute on chronic respiratory failure with hypoxia and hypercapnia (HCC) Active Problems:   Morbid obesity with BMI of 50.0-59.9, adult (HCC)   OSA    CKD (chronic kidney disease) stage 3, GFR 30-59 ml/min (HCC)   GERD (gastroesophageal reflux disease)   Essential hypertension   Chronic asthmatic bronchitis (  [place])   Acute on chronic diastolic congestive heart failure (HCC)   Extreme obesity with alveolar hypoventilation (HCC)   Hypokalemia   Acute sinusitis  71 year old morbidly obese white female with history of obstructive sleep apnea on CPAP, chronic diastolic heart failure, hypertension, chronic kidney disease stage IIIb/IV now admitted with history of shortness of breath.   #1: Fluid overload/exacerbation of diastolic congestive heart failure: We will continue the furosemide drip at 5 mg an hour. Due to lack of progress as expected, we will order one-time dose of metolazone 5 mg. Discussed with patient the need for fluid restriction due to fluid status. Will order fluid restriction of 1200-1500 ml.    #2:  Chronic kidney disease: CKD stage IIIb most likely secondary to chronic hypertensive nephrosclerosis.  Renal function continues to improve.    #3: Anemia: Anemia possibly secondary to chronic kidney disease/iron deficiency.  Hemoglobin in acceptable range.     LOS: [facility] 9/12/20232:21 PM   Patient was examined and evaluated with Colon Flattery, NP.  Plan of care was formulated and discussed with patient as well as NP.  I agree with the note as documented with edits.

## 2022-01-30 ENCOUNTER — Encounter
Admission: EM | Disposition: A | Discharge status: Home / Self Care | Payer: Self-pay | Source: Home / Self Care | Attending: Internal Medicine

## 2022-01-30 DIAGNOSIS — I5033 Acute on chronic diastolic (congestive) heart failure: Secondary | ICD-10-CM | POA: Diagnosis not present

## 2022-01-30 DIAGNOSIS — I272 Pulmonary hypertension, unspecified: Secondary | ICD-10-CM

## 2022-01-30 DIAGNOSIS — N183 Chronic kidney disease, stage 3 unspecified: Secondary | ICD-10-CM | POA: Diagnosis not present

## 2022-01-30 DIAGNOSIS — J9621 Acute and chronic respiratory failure with hypoxia: Secondary | ICD-10-CM | POA: Diagnosis not present

## 2022-01-30 DIAGNOSIS — J9622 Acute and chronic respiratory failure with hypercapnia: Secondary | ICD-10-CM | POA: Diagnosis not present

## 2022-01-30 HISTORY — PX: RIGHT HEART CATH: CATH118263

## 2022-01-30 LAB — CREATININE, SERUM
Creatinine, Ser: 2.18 mg/dL — ABNORMAL HIGH (ref 0.44–1.00)
GFR, Estimated: 24 mL/min — ABNORMAL LOW (ref >60)

## 2022-01-30 SURGERY — RIGHT HEART CATH
Anesthesia: Moderate Sedation

## 2022-01-30 MED ORDER — LABETALOL HCL 5 MG/ML IV SOLN: 10.0000 mg | INTRAVENOUS | Status: AC | PRN

## 2022-01-30 MED ORDER — SODIUM CHLORIDE 0.9% FLUSH: 3.0000 mL | Freq: Two times a day (BID) | INTRAVENOUS | Status: DC

## 2022-01-30 MED ORDER — SODIUM CHLORIDE 0.9 % IV SOLN: INTRAVENOUS | Status: DC

## 2022-01-30 MED ORDER — HEPARIN (PORCINE) IN NACL 1000-0.9 UT/500ML-% IV SOLN
INTRAVENOUS | Status: AC
  Filled 2022-01-30: qty 1000

## 2022-01-30 MED ORDER — SODIUM CHLORIDE 0.9 % IV SOLN: 250.0000 mL | INTRAVENOUS | Status: DC | PRN

## 2022-01-30 MED ORDER — HEPARIN (PORCINE) IN NACL 1000-0.9 UT/500ML-% IV SOLN
INTRAVENOUS | Status: DC | PRN
  Administered 2022-01-30 (×2): 500 mL

## 2022-01-30 MED ORDER — LIDOCAINE HCL 1 % IJ SOLN
INTRAMUSCULAR | Status: AC
  Filled 2022-01-30: qty 20

## 2022-01-30 MED ORDER — HYDRALAZINE HCL 20 MG/ML IJ SOLN: 10.0000 mg | INTRAMUSCULAR | Status: AC | PRN

## 2022-01-30 MED ORDER — SODIUM CHLORIDE 0.9% FLUSH: 3.0000 mL | INTRAVENOUS | Status: DC | PRN

## 2022-01-30 MED ORDER — LIDOCAINE HCL (PF) 1 % IJ SOLN
INTRAMUSCULAR | Status: DC | PRN
  Administered 2022-01-30: 2 mL

## 2022-01-30 MED ORDER — SODIUM CHLORIDE 0.9% FLUSH
3.0000 mL | Freq: Two times a day (BID) | INTRAVENOUS | Status: DC
  Administered 2022-01-31: 3 mL via INTRAVENOUS

## 2022-01-30 SURGICAL SUPPLY — 6 items
CATH SWAN GANZ 7F STRAIGHT (CATHETERS) IMPLANT
DRAPE BRACHIAL (DRAPES) IMPLANT
GLIDESHEATH SLEND SS 6F .021 (SHEATH) IMPLANT
GLIDESHEATH SLENDER 7FR .021G (SHEATH) IMPLANT
KIT RIGHT HEART ACIST (MISCELLANEOUS) IMPLANT
PACK CARDIAC CATH (CUSTOM PROCEDURE TRAY) ×2 IMPLANT

## 2022-01-30 NOTE — Progress Notes (Addendum)
[facility]  PROGRESS NOTE   Subjective:   Patient sitting up in chair States she feels some better today Currently on room air States she is able to ambulate to bathroom with less shortness of breath during recovery.   Creatinine 2.2 Urine output 1.7L Weight 151kg from 151.5kg  Objective:  Vital signs: Blood pressure (!) 126/40, pulse 92, temperature 98.2 F (36.8 C), temperature source Oral, resp. rate (!) 21, height '5\' 4"'$  (1.626 m), weight (!) 151 kg, SpO2 92 %.  Intake/Output Summary (Last 24 hours) at 01/30/2022 1109 Last data filed at 01/30/2022 0700 Gross per 24 hour  Intake 271.48 ml  Output 1800 ml  Net -1528.52 ml    Filed Weights   01/28/22 1245 01/29/22 0632 01/30/22 0444  Weight: (!) 151.5 kg (!) 151.5 kg (!) 151 kg     Physical Exam: General:  No acute distress  Head:  Normocephalic, atraumatic. Moist oral mucosal membranes  Eyes:  Anicteric  Lungs:  Diminished in bases, normal effort  Heart:  S1S2 no rubs  Abdomen:   Soft, nontender, bowel sounds present  Extremities: 1+ peripheral edema.  Neurologic:  Awake, alert, following commands  Skin:  No lesions  Access: None    Basic Metabolic Panel: Recent Labs  Lab 01/25/22 0541 01/26/22 0500 01/27/22 0532 01/28/22 0859 01/29/22 0551 01/30/22 0436  NA 135 134* 134* 134* 137  --   K 3.6 3.5 4.0 4.2 4.4  --   CL 92* 92* 92* 95* 98  --   CO2 33* '30 30 28 29  '$ --   GLUCOSE 105* 118* 101* 126* 101*  --   BUN 60* 63* 64* 66* 67*  --   CREATININE 2.44* 2.40* 2.28* 2.06* 2.01* 2.18*  CALCIUM 9.0 9.2 9.3 9.9 9.6  --   MG  --  1.9  --   --   --   --      CBC: Recent Labs  Lab 01/24/22 0518 01/25/22 0541 01/26/22 0500 01/27/22 0532 01/29/22 0551  WBC 13.2* 11.7* 12.6* 12.5* 9.9  HGB 10.8* 10.4* 9.8* 9.9* 9.4*  HCT 36.5 34.2* 32.0* 31.6* 30.2*  MCV 83.1 81.2 81.2 80.2 79.7*  PLT 338 307 282 300 288      Urinalysis: No results for input(s): "COLORURINE", "LABSPEC", "PHURINE",  "GLUCOSEU", "HGBUR", "BILIRUBINUR", "KETONESUR", "PROTEINUR", "UROBILINOGEN", "NITRITE", "LEUKOCYTESUR" in the last 72 hours.  Invalid input(s): "APPERANCEUR"    Imaging: No results found.   Medications:    sodium chloride     [START ON 01/31/2022] sodium chloride 10 mL/hr at 01/30/22 1043   furosemide (LASIX) 200 mg in dextrose 5 % 100 mL (2 mg/mL) infusion 5 mg/hr (01/29/22 1558)    [MAR Hold] budesonide  0.5 mg Nebulization BID   [MAR Hold] cholestyramine  4 g Oral QHS   [MAR Hold] dextromethorphan-guaiFENesin  1 tablet Oral BID   [MAR Hold] diltiazem  240 mg Oral Daily   [MAR Hold] enoxaparin (LOVENOX) injection  0.5 mg/kg Subcutaneous Q24H   [MAR Hold] loratadine  10 mg Oral Daily   [MAR Hold] pantoprazole  40 mg Oral Daily   [MAR Hold] potassium chloride  40 mEq Oral Q2000   [MAR Hold] rOPINIRole  1 mg Oral TID   sodium chloride flush  3 mL Intravenous Q12H    Assessment/ Plan:     Principal Problem:   Acute on chronic respiratory failure with hypoxia and hypercapnia (HCC) Active Problems:   Morbid obesity with BMI of 50.0-59.9, adult ([place])  OSA    CKD (chronic kidney disease) stage 3, GFR 30-59 ml/min (HCC)   GERD (gastroesophageal reflux disease)   Essential hypertension   Chronic asthmatic bronchitis (HCC)   Acute on chronic diastolic congestive heart failure (HCC)   Extreme obesity with alveolar hypoventilation (HCC)   Hypokalemia   Acute sinusitis  71 year old morbidly obese white female with history of obstructive sleep apnea on CPAP, chronic diastolic heart failure, hypertension, chronic kidney disease stage IIIb/IV now admitted with history of shortness of breath.   #1: Fluid overload/exacerbation of diastolic congestive heart failure: We will continue the furosemide drip at 5 mg an hour. Patient given Metolazone '5mg'$  yesterday with adequate urine output and expected creatinine rise. Will hold Metolazone dose today and monitor.    #2: Chronic kidney  disease: CKD stage IIIb most likely secondary to chronic hypertensive nephrosclerosis.  Creatinine slightly elevated with metolazone dose, will continue to monitor.    #3: Anemia: Anemia possibly secondary to chronic kidney disease/iron deficiency.  Hemoglobin remains acceptable.      LOS: [facility] 9/13/202311:09 AM   Plan of care was formulated and discussed with NP.  I agree with the note as documented with edits.

## 2022-01-30 NOTE — Progress Notes (Signed)
Progress Note   Patient: Samantha Mason PTW:[id number] DOB: 06/21/1950 DOA: 01/23/2022     6 DOS: the patient was seen and examined on 01/30/2022   Brief hospital course: 71 year old female with morbid obesity, obstructive sleep apnea on trilogy for the last 6 months, COPD on O2 as needed (uses only at night with Trilogy), chronic diastolic heart failure with EF of 55 to 60%, CKD stage IV was sent from the pulmonologist office for evaluation of worsening shortness of breath for 2 days.  She reports that she is short of breath all the time, both at rest and with activity.  She does not add any added salt to her cooking, takes her medications as prescribed including torsemide and PRN metolazone for 2 lb wt gain.  She reported recently lost 2 pounds.  Reports using trilogy NIV at home and was able to stay out of the hospital for some time.  She was sent to the ER from pulmonary clinic being found in respiratory distress with O2 saturation was 88% on room air in the ER.   Admitted to medicine service with pulmonology and cardiology consulted.  Started on IV Lasix pushes BID.  Creatinine worsened slightly.   Nephrology consulted and now on Lasix drip.     9/8:  still requiring continuous O2 and with ongoing dyspnea on exertion much worse than baseline. 9/9-10: remains on Lasix drip, renal function improving, on room air at rest now   Assessment and Plan: Acute on chronic respiratory failure with hypoxia and hypercapnia (HCC) At baseline on Trilogy/NIV with nocturnal oxygen.  Presented with dyspnea and room air spO2 88% at pulmonology visit.  Appears due to volume overload, improving with diuresis.  Asthma seems stable. --O2 per protocol, goal spO2 90-94% --Continue Trilogy when sleeping  Acute on chronic diastolic congestive heart failure (HCC) Acute hypoxia improving with diuresis, but volume status on exam difficult to assess.   --On Lasix drip --Nephrology following given baseline CKD and  recent worsening --Cardiology also following, rhc today, no sig cardiac dysfunction --Hold home metolazone and torsemide --Low-sodium diet, fluid restriction --Daily weights, I/O's --Monitor renal function and electrolytes  CKD (chronic kidney disease) stage 3, GFR 30-59 ml/min (HCC) AKI superimposed on CKD stage IIIb Renal function recently worsening. Cr on admission was 2.08 Cr stable low 2s --Nephrology following --Monitor BMP daily --Diuresis per nephro --On Lasix drip with IV albumin 25 mg BID to augment diuresis  Acute sinusitis Pt with sinus congestion, chills, leukocytosis. Treated by previous provider with azithromycin, now complete  Chronic asthmatic bronchitis ([place]) Stable Continue inhaled steroids and as needed bronchodilator therapy Continue oxygen supplementation at 2 L as needed to maintain spO2 90-94%.  Essential hypertension Blood pressure is stable, soft diastolic. Continue Cardizem On Lasix drip  Hypokalemia Resolved, monitor  Extreme obesity with alveolar hypoventilation ([place]) Continue Trilogy NIV when sleeping. Follow up with Pulmonology as outpt, they consulted here, nothing further for them to offer Wt loss recommended.  GERD (gastroesophageal reflux disease) Stable Continue PPI  OSA  Secondary to morbid obesity Continue trilogy at bedtime  Morbid obesity with BMI of 50.0-59.9, adult [facility]) Patient has a BMI of 70.01 Complicates overall prognosis and care Lifestyle modification and exercise has been discussed with her in detail     \ Subjective: tolerated cath, thinks breathing and swelling improved   Physical Exam: Vitals:   01/30/22 1315 01/30/22 1330 01/30/22 1345 01/30/22 1400  BP: (!) 137/56 122/61 (!) 124/57 118/60  Pulse: 93 83 79 76  Resp: '20 18 16 '$ (!) 21  Temp:      TempSrc:      SpO2: 93% 94% 94% 95%  Weight:      Height:       General exam: awake, alert, no acute distress, obese HEENT: moist mucus membranes,  hearing grossly normal, sounds less congested Respiratory system: CTA B, no expiratory wheezes, normal respiratory effort at rest, on room air Cardiovascular system: normal S1/S2, RRR, bilateral pedal edema improving, unable to assess JVD Central nervous system: A&O x4. no gross focal neurologic deficits, normal speech Extremities: moves all, trace LE edema, normal tone Skin: dry, intact, normal temperature Psychiatry: normal mood, congruent affect, judgement and insight appear normal   Data Reviewed:  Notable labs --- creatinine improved 2.01, BUN 67.  CBC with hemoglobin 9.4 from 9.9.    Family Communication: son at bedside on rounds 9/10  Disposition: Status is: Inpatient Remains inpatient appropriate because: remains on IV diuresis with Lasix drip, ongoing evaluation     Planned Discharge Destination: Home    Author: Desma Maxim, MD 01/30/2022 3:31 PM  For on call review [email or web address].

## 2022-01-30 NOTE — Progress Notes (Signed)
Rounding Note    Patient Name: Samantha Mason Date of Encounter: 01/30/2022  [place] Cardiologist: Nelva Bush, MD   Subjective   Edema is improving, shortness of breath better, having adequate diuresing, lost 2 pounds over the past 24 hours.  Right heart cath planned later this morning.  Inpatient Medications    Scheduled Meds:  budesonide  0.5 mg Nebulization BID   cholestyramine  4 g Oral QHS   dextromethorphan-guaiFENesin  1 tablet Oral BID   diltiazem  240 mg Oral Daily   enoxaparin (LOVENOX) injection  0.5 mg/kg Subcutaneous Q24H   loratadine  10 mg Oral Daily   pantoprazole  40 mg Oral Daily   potassium chloride  40 mEq Oral Q2000   rOPINIRole  1 mg Oral TID   sodium chloride flush  3 mL Intravenous Q12H   Continuous Infusions:  sodium chloride     [START ON 01/31/2022] sodium chloride     furosemide (LASIX) 200 mg in dextrose 5 % 100 mL (2 mg/mL) infusion 5 mg/hr (01/29/22 1558)   PRN Meds: sodium chloride, acetaminophen, benzonatate, ipratropium-albuterol, promethazine, sodium chloride flush   Vital Signs    Vitals:   01/30/22 0444 01/30/22 0448 01/30/22 0725 01/30/22 0840  BP:  (!) 140/62  (!) 129/53  Pulse:  88  92  Resp:  20  (!) 23  Temp:  97.8 F (36.6 C)  98.8 F (37.1 C)  TempSrc:      SpO2:  95% 97% 92%  Weight: (!) 151 kg     Height:        Intake/Output Summary (Last 24 hours) at 01/30/2022 1010 Last data filed at 01/30/2022 0700 Gross per 24 hour  Intake 271.48 ml  Output 1800 ml  Net -1528.52 ml      01/30/2022    4:44 AM 01/29/2022    6:32 AM 01/28/2022   12:45 PM  Last 3 Weights  Weight (lbs) 332 lb 14.3 oz 334 lb 334 lb  Weight (kg) 151 kg 151.5 kg 151.5 kg      Telemetry    Sinus rhythm heart rate 87- Personally Reviewed  ECG     - Personally Reviewed  Physical Exam   GEN: No acute distress.   Neck: No JVD Cardiac: RRR  Respiratory: Poor inspiratory effort, diminished breath sounds at bases GI:  Soft, nontender, non-distended  MS: 2+ nonpitting edema; lymphedema also present Neuro:  Nonfocal  Psych: Normal affect   Labs    High Sensitivity Troponin:   Recent Labs  Lab 01/23/22 1459 01/23/22 1801  TROPONINIHS 11 10     Chemistry Recent Labs  Lab 01/26/22 0500 01/27/22 0532 01/28/22 0859 01/29/22 0551 01/30/22 0436  NA 134* 134* 134* 137  --   K 3.5 4.0 4.2 4.4  --   CL 92* 92* 95* 98  --   CO2 '30 30 28 29  '$ --   GLUCOSE 118* 101* 126* 101*  --   BUN 63* 64* 66* 67*  --   CREATININE 2.40* 2.28* 2.06* 2.01* 2.18*  CALCIUM 9.2 9.3 9.9 9.6  --   MG 1.9  --   --   --   --   GFRNONAA 21* 23* 26* 26* 24*  ANIONGAP '12 12 11 10  '$ --     Lipids No results for input(s): "CHOL", "TRIG", "HDL", "LABVLDL", "LDLCALC", "CHOLHDL" in the last 168 hours.  Hematology Recent Labs  Lab 01/26/22 0500 01/27/22 0532 01/29/22 0551  WBC 12.6*  12.5* 9.9  RBC 3.94 3.94  3.94 3.79*  HGB 9.8* 9.9* 9.4*  HCT 32.0* 31.6* 30.2*  MCV 81.2 80.2 79.7*  MCH 24.9* 25.1* 24.8*  MCHC 30.6 31.3 31.1  RDW 15.3 15.4 15.9*  PLT 282 300 288   Thyroid No results for input(s): "TSH", "FREET4" in the last 168 hours.  BNP Recent Labs  Lab 01/23/22 1459  BNP 19.6    DDimer  Recent Labs  Lab 01/23/22 1801  DDIMER 1.09*     Radiology    No results found.  Cardiac Studies   TTE 06/2021 1. Left ventricular ejection fraction, by estimation, is 55 to 60%. The  left ventricle has normal function. Left ventricular endocardial border  not optimally defined to evaluate regional wall motion. There is mild left  ventricular hypertrophy. Left  ventricular diastolic parameters are indeterminate.   2. Right ventricular systolic function is normal. The right ventricular  size is normal. Tricuspid regurgitation signal is inadequate for assessing  PA pressure.   3. The mitral valve is normal in structure. No evidence of mitral valve  regurgitation. No evidence of mitral stenosis.   4. The aortic  valve is normal in structure. Aortic valve regurgitation is  not visualized. No aortic stenosis is present.   5. The inferior vena cava is normal in size with greater than 50%  respiratory variability, suggesting right atrial pressure of 3 mmHg.   6. Challenging image quality.   Patient Profile     71 y.o. female with history of morbid obesity, HFpEF, CKD 4, obesity hypoventilation syndrome presenting with worsening shortness of breath, being seen for HFpEF.  Assessment & Plan    HFpEF -Right heart cath planned today to evaluate volume status -Net -1.4 L over the past 24 hours -Continue Lasix drip as per nephrology -Creatinine stable/slightly improved -Will likely need higher dose of torsemide upon discharge, 60 mg twice daily.  Was previously on 40 mg twice daily.  2.  CKD 4 -Monitor creatinine with diuresing -Nephrology following  3.  Morbid obesity, respiratory failure -CPAP, management as per primary team.  Total encounter time more than 50 minutes  Greater than 50% was spent in counseling and coordination of care with the patient     Signed, Kate Sable, MD  01/30/2022, 10:10 AM

## 2022-01-30 NOTE — Progress Notes (Signed)
Mobility Specialist - Progress Note   01/30/22 1037  Mobility  Activity Off unit   Pt off unit during attempt. Will attempt again at another time and date.   Gretchen Short  Mobility Specialist  01/30/22 10:37 AM

## 2022-01-30 NOTE — H&P (View-Only) (Signed)
Rounding Note    Patient Name: Samantha Mason Date of Encounter: 01/30/2022  [place] Cardiologist: Nelva Bush, MD   Subjective   Edema is improving, shortness of breath better, having adequate diuresing, lost 2 pounds over the past 24 hours.  Right heart cath planned later this morning.  Inpatient Medications    Scheduled Meds:  budesonide  0.5 mg Nebulization BID   cholestyramine  4 g Oral QHS   dextromethorphan-guaiFENesin  1 tablet Oral BID   diltiazem  240 mg Oral Daily   enoxaparin (LOVENOX) injection  0.5 mg/kg Subcutaneous Q24H   loratadine  10 mg Oral Daily   pantoprazole  40 mg Oral Daily   potassium chloride  40 mEq Oral Q2000   rOPINIRole  1 mg Oral TID   sodium chloride flush  3 mL Intravenous Q12H   Continuous Infusions:  sodium chloride     [START ON 01/31/2022] sodium chloride     furosemide (LASIX) 200 mg in dextrose 5 % 100 mL (2 mg/mL) infusion 5 mg/hr (01/29/22 1558)   PRN Meds: sodium chloride, acetaminophen, benzonatate, ipratropium-albuterol, promethazine, sodium chloride flush   Vital Signs    Vitals:   01/30/22 0444 01/30/22 0448 01/30/22 0725 01/30/22 0840  BP:  (!) 140/62  (!) 129/53  Pulse:  88  92  Resp:  20  (!) 23  Temp:  97.8 F (36.6 C)  98.8 F (37.1 C)  TempSrc:      SpO2:  95% 97% 92%  Weight: (!) 151 kg     Height:        Intake/Output Summary (Last 24 hours) at 01/30/2022 1010 Last data filed at 01/30/2022 0700 Gross per 24 hour  Intake 271.48 ml  Output 1800 ml  Net -1528.52 ml      01/30/2022    4:44 AM 01/29/2022    6:32 AM 01/28/2022   12:45 PM  Last 3 Weights  Weight (lbs) 332 lb 14.3 oz 334 lb 334 lb  Weight (kg) 151 kg 151.5 kg 151.5 kg      Telemetry    Sinus rhythm heart rate 87- Personally Reviewed  ECG     - Personally Reviewed  Physical Exam   GEN: No acute distress.   Neck: No JVD Cardiac: RRR  Respiratory: Poor inspiratory effort, diminished breath sounds at bases GI:  Soft, nontender, non-distended  MS: 2+ nonpitting edema; lymphedema also present Neuro:  Nonfocal  Psych: Normal affect   Labs    High Sensitivity Troponin:   Recent Labs  Lab 01/23/22 1459 01/23/22 1801  TROPONINIHS 11 10     Chemistry Recent Labs  Lab 01/26/22 0500 01/27/22 0532 01/28/22 0859 01/29/22 0551 01/30/22 0436  NA 134* 134* 134* 137  --   K 3.5 4.0 4.2 4.4  --   CL 92* 92* 95* 98  --   CO2 '30 30 28 29  '$ --   GLUCOSE 118* 101* 126* 101*  --   BUN 63* 64* 66* 67*  --   CREATININE 2.40* 2.28* 2.06* 2.01* 2.18*  CALCIUM 9.2 9.3 9.9 9.6  --   MG 1.9  --   --   --   --   GFRNONAA 21* 23* 26* 26* 24*  ANIONGAP '12 12 11 10  '$ --     Lipids No results for input(s): "CHOL", "TRIG", "HDL", "LABVLDL", "LDLCALC", "CHOLHDL" in the last 168 hours.  Hematology Recent Labs  Lab 01/26/22 0500 01/27/22 0532 01/29/22 0551  WBC 12.6*  12.5* 9.9  RBC 3.94 3.94  3.94 3.79*  HGB 9.8* 9.9* 9.4*  HCT 32.0* 31.6* 30.2*  MCV 81.2 80.2 79.7*  MCH 24.9* 25.1* 24.8*  MCHC 30.6 31.3 31.1  RDW 15.3 15.4 15.9*  PLT 282 300 288   Thyroid No results for input(s): "TSH", "FREET4" in the last 168 hours.  BNP Recent Labs  Lab 01/23/22 1459  BNP 19.6    DDimer  Recent Labs  Lab 01/23/22 1801  DDIMER 1.09*     Radiology    No results found.  Cardiac Studies   TTE 06/2021 1. Left ventricular ejection fraction, by estimation, is 55 to 60%. The  left ventricle has normal function. Left ventricular endocardial border  not optimally defined to evaluate regional wall motion. There is mild left  ventricular hypertrophy. Left  ventricular diastolic parameters are indeterminate.   2. Right ventricular systolic function is normal. The right ventricular  size is normal. Tricuspid regurgitation signal is inadequate for assessing  PA pressure.   3. The mitral valve is normal in structure. No evidence of mitral valve  regurgitation. No evidence of mitral stenosis.   4. The aortic  valve is normal in structure. Aortic valve regurgitation is  not visualized. No aortic stenosis is present.   5. The inferior vena cava is normal in size with greater than 50%  respiratory variability, suggesting right atrial pressure of 3 mmHg.   6. Challenging image quality.   Patient Profile     71 y.o. female with history of morbid obesity, HFpEF, CKD 4, obesity hypoventilation syndrome presenting with worsening shortness of breath, being seen for HFpEF.  Assessment & Plan    HFpEF -Right heart cath planned today to evaluate volume status -Net -1.4 L over the past 24 hours -Continue Lasix drip as per nephrology -Creatinine stable/slightly improved -Will likely need higher dose of torsemide upon discharge, 60 mg twice daily.  Was previously on 40 mg twice daily.  2.  CKD 4 -Monitor creatinine with diuresing -Nephrology following  3.  Morbid obesity, respiratory failure -CPAP, management as per primary team.  Total encounter time more than 50 minutes  Greater than 50% was spent in counseling and coordination of care with the patient     Signed, Kate Sable, MD  01/30/2022, 10:10 AM

## 2022-01-30 NOTE — Interval H&P Note (Signed)
History and Physical Interval Note:  01/30/2022 12:03 PM  Samantha Mason  has presented today for surgery, with the diagnosis of HFpEF.  The various methods of treatment have been discussed with the patient and family. After consideration of risks, benefits and other options for treatment, the patient has consented to  Procedure(s): RIGHT HEART CATH (N/A) as a surgical intervention.  The patient's history has been reviewed, patient examined, no change in status, stable for surgery.  I have reviewed the patient's chart and labs.  Questions were answered to the patient's satisfaction.     Fallyn Munnerlyn

## 2022-01-30 NOTE — Progress Notes (Signed)
PT Cancellation Note  Patient Details Name: Samantha Mason MRN: [id number] DOB: 1950-12-02   Cancelled Treatment:    Reason Eval/Treat Not Completed: Patient at procedure or test/unavailable (Pt off unit for cardiac cath. Will resume services at later date/time.)  12:53 PM, 01/30/22 Etta Grandchild, PT, DPT Physical Therapist - [facility]  Outpatient Physical Therapy- [place] [phone]     Vittorio Mohs C 01/30/2022, 12:52 PM

## 2022-01-30 NOTE — Plan of Care (Signed)
  Problem: Safety: Goal: Ability to remain free from injury will improve Outcome: Progressing   Problem: Skin Integrity: Goal: Risk for impaired skin integrity will decrease Outcome: Progressing   Problem: Activity: Goal: Ability to return to baseline activity level will improve Outcome: Progressing   Problem: Cardiovascular: Goal: Ability to achieve and maintain adequate cardiovascular perfusion will improve Outcome: Progressing Goal: Vascular access site(s) Level 0-1 will be maintained Outcome: Progressing   Problem: Health Behavior/Discharge Planning: Goal: Ability to safely manage health-related needs after discharge will improve Outcome: Progressing

## 2022-01-31 ENCOUNTER — Encounter: Payer: Self-pay | Admitting: Internal Medicine

## 2022-01-31 DIAGNOSIS — J9620 Acute and chronic respiratory failure, unspecified whether with hypoxia or hypercapnia: Secondary | ICD-10-CM | POA: Diagnosis not present

## 2022-01-31 DIAGNOSIS — J9621 Acute and chronic respiratory failure with hypoxia: Secondary | ICD-10-CM | POA: Diagnosis not present

## 2022-01-31 DIAGNOSIS — J449 Chronic obstructive pulmonary disease, unspecified: Secondary | ICD-10-CM | POA: Diagnosis not present

## 2022-01-31 DIAGNOSIS — E662 Morbid (severe) obesity with alveolar hypoventilation: Secondary | ICD-10-CM | POA: Diagnosis not present

## 2022-01-31 DIAGNOSIS — J9622 Acute and chronic respiratory failure with hypercapnia: Secondary | ICD-10-CM | POA: Diagnosis not present

## 2022-01-31 DIAGNOSIS — I5033 Acute on chronic diastolic (congestive) heart failure: Secondary | ICD-10-CM | POA: Diagnosis not present

## 2022-01-31 LAB — BASIC METABOLIC PANEL
Anion gap: 10 (ref 5–15)
BUN: 66 mg/dL — ABNORMAL HIGH (ref 8–23)
CO2: 29 mmol/L (ref 22–32)
Calcium: 9.5 mg/dL (ref 8.9–10.3)
Chloride: 99 mmol/L (ref 98–111)
Creatinine, Ser: 1.96 mg/dL — ABNORMAL HIGH (ref 0.44–1.00)
GFR, Estimated: 27 mL/min — ABNORMAL LOW (ref >60)
Glucose, Bld: 110 mg/dL — ABNORMAL HIGH (ref 70–99)
Potassium: 4.6 mmol/L (ref 3.5–5.1)
Sodium: 138 mmol/L (ref 135–145)

## 2022-01-31 MED ORDER — TORSEMIDE 20 MG PO TABS
40.0000 mg | ORAL_TABLET | Freq: Every day | ORAL | Status: DC
  Administered 2022-01-31 – 2022-02-01 (×2): 40 mg via ORAL
  Filled 2022-01-31 (×2): qty 2

## 2022-01-31 NOTE — Progress Notes (Signed)
Physical Therapy Treatment Patient Details Name: Samantha Mason MRN: [id number] DOB: [birth year] Today's Date: 01/31/2022   History of Present Illness Samantha Mason is a 71 y.o. female with a hx of morbid obesity, obesity hypoventilation syndrome on trelogy at home, [facility], ckd-IV presenting from pulmonary clinic for worsening shortness of breath over the past several days.  Cardiac cath 9/13.    PT Comments    Pt was able to do a prolonged bout of ambulation, she reports she essentially never goes 200 ft but has been trying to walk up and down the hallway in her home more recently.  She reports not needing an AD, and while she ultimately was able to do stretches w/o UEs most of the time she was cruising along wall/nurses station or (very lightly) using PT HHA.  We did discuss potentially using a SPC; pt also needed 2 brief standing rest breaks and we discussed the benefits of 4WW for both stability and the option of sitting to rest.  Pt's HR rose 80s to 110s with ambulation but quickly recovered to 80s ~1 minute after sitting.  Similarly she donned 2L O2 on return to room (sats only dropped a few points to mid 90s from high 90s with the effort) but she self selected removing O2 at ~1 minute post ambulation as well.   Recommendations for follow up therapy are one component of a multi-disciplinary discharge planning process, led by the attending physician.  Recommendations may be updated based on patient status, additional functional criteria and insurance authorization.  Follow Up Recommendations  No PT follow up     Assistance Recommended at Discharge Intermittent Supervision/Assistance  Patient can return home with the following Help with stairs or ramp for entrance;Assist for transportation   Equipment Recommendations  Rollator (4 wheels) (bariatric)    Recommendations for Other Services       Precautions / Restrictions Precautions Precautions: Fall Restrictions Weight Bearing  Restrictions: No     Mobility  Bed Mobility               General bed mobility comments: Pt in recliner pre/post session.    Transfers Overall transfer level: Modified independent Equipment used: None Transfers: Sit to/from Stand Sit to Stand: Modified independent (Device/Increase time)           General transfer comment: Pt able to rise to standing with relative ease, appropraite use of UEs    Ambulation/Gait Ambulation/Gait assistance: Supervision Gait Distance (Feet): 200 Feet Assistive device: None         General Gait Details: Pt reports not neeeding/wanting to use AD today, however much of the loop she was reaching either hand to walls, counter-tops, etc and does endorse that this is near baseline and she typically furniture walks in the home.  Pt with significant lateral lean over BOS leg with each step.  Pt took to brief standing rest breaks during the circumambulation, O2 remained in the mid/high 90s on room air and HR rose from 80s to 110s with the effort but quickly recovered back to 80s back in sitting   Stairs             Wheelchair Mobility    Modified Rankin (Stroke Patients Only)       Balance Overall balance assessment: Needs assistance Sitting-balance support: Feet supported Sitting balance-Leahy Scale: Good     Standing balance support: During functional activity, Single extremity supported Standing balance-Leahy Scale: Fair Standing balance comment: Pt with no LOBs but consistent  baseline lateral lean/waddle as well as (reflexively?) reaching UEs to surfaces to steady                            Cognition Arousal/Alertness: Awake/alert Behavior During Therapy: WFL for tasks assessed/performed Overall Cognitive Status: Within Functional Limits for tasks assessed                                          Exercises      General Comments General comments (skin integrity, edema, etc.): Pt reports that  the 200 ft we did today was more than she ever really does at one time.  O2 stayed >95% but she did place O2 for ~1 minute post ambulation for comfort.      Pertinent Vitals/Pain Pain Assessment Pain Assessment: No/denies pain    Home Living                          Prior Function            PT Goals (current goals can now be found in the care plan section) Progress towards PT goals: Progressing toward goals    Frequency    Min 2X/week      PT Plan Current plan remains appropriate    Co-evaluation              AM-PAC PT "6 Clicks" Mobility   Outcome Measure  Help needed turning from your back to your side while in a flat bed without using bedrails?: None Help needed moving from lying on your back to sitting on the side of a flat bed without using bedrails?: None Help needed moving to and from a bed to a chair (including a wheelchair)?: None Help needed standing up from a chair using your arms (e.g., wheelchair or bedside chair)?: None Help needed to walk in hospital room?: None Help needed climbing 3-5 steps with a railing? : A Little 6 Click Score: 23    End of Session   Activity Tolerance: Patient limited by fatigue Patient left: with call bell/phone within reach;in chair;with family/visitor present   PT Visit Diagnosis: Unsteadiness on feet (R26.81);Muscle weakness (generalized) (M62.81)     Time: 2542-7062 PT Time Calculation (min) (ACUTE ONLY): 15 min  Charges:  $Gait Training: 8-22 mins                     Kreg Shropshire, DPT 01/31/2022, 4:52 PM

## 2022-01-31 NOTE — Plan of Care (Signed)
  Problem: Clinical Measurements: Goal: Respiratory complications will improve Outcome: Progressing   Problem: Clinical Measurements: Goal: Cardiovascular complication will be avoided Outcome: Progressing   Problem: Activity: Goal: Risk for activity intolerance will decrease Outcome: Progressing   Problem: Pain Managment: Goal: General experience of comfort will improve Outcome: Progressing   Problem: Safety: Goal: Ability to remain free from injury will improve Outcome: Progressing

## 2022-01-31 NOTE — Progress Notes (Addendum)
Progress Note   Patient: Samantha Mason:[id number] DOB: December 15, 1950 DOA: 01/23/2022     7 DOS: the patient was seen and examined on 01/31/2022   Brief hospital course: 71 year old female with morbid obesity, obstructive sleep apnea on trilogy for the last 6 months, COPD on O2 as needed (uses only at night with Trilogy), chronic diastolic heart failure with EF of 55 to 60%, CKD stage IV was sent from the pulmonologist office for evaluation of worsening shortness of breath for 2 days.  She reports that she is short of breath all the time, both at rest and with activity.  She does not add any added salt to her cooking, takes her medications as prescribed including torsemide and PRN metolazone for 2 lb wt gain.  She reported recently lost 2 pounds.  Reports using trilogy NIV at home and was able to stay out of the hospital for some time.  She was sent to the ER from pulmonary clinic being found in respiratory distress with O2 saturation was 88% on room air in the ER.   Admitted to medicine service with pulmonology and cardiology consulted.  Started on IV Lasix pushes BID.  Creatinine worsened slightly.   Nephrology consulted and now on Lasix drip.     9/8:  still requiring continuous O2 and with ongoing dyspnea on exertion much worse than baseline. 9/9-10: remains on Lasix drip, renal function improving, on room air at rest now   Assessment and Plan: Acute on chronic respiratory failure with hypoxia and hypercapnia (HCC) At baseline on Trilogy/NIV with nocturnal oxygen.  Presented with dyspnea and room air spO2 88% at pulmonology visit.  Appears due to volume overload, improving with diuresis.  Asthma seems stable. Now weaned off o2 at rest --O2 per protocol, goal spO2 90-94% --Continue Trilogy when sleeping  Acute on chronic diastolic congestive heart failure (HCC) Acute hypoxia improving with diuresis, but volume status on exam difficult to assess.   --On Lasix drip, will discuss w/  nephrology today. Out 1.3 liters last 24 --Nephrology following given baseline CKD and recent worsening --Cardiology also following, rhc today, no sig cardiac dysfunction, will query them regarding plans for further cardiac w/u inpt --Hold home metolazone and torsemide --Low-sodium diet, fluid restriction --Daily weights, I/O's --Monitor renal function and electrolytes  CKD (chronic kidney disease) stage 3, GFR 30-59 ml/min (HCC) AKI superimposed on CKD stage IIIb Renal function recently worsening. Cr on admission was 2.08 Cr stable ~2 --Nephrology following --Monitor BMP daily --Diuresis per nephro --On Lasix drip with   Acute sinusitis Pt with sinus congestion, chills, leukocytosis. Treated by previous provider with azithromycin, now complete  Chronic asthmatic bronchitis ([place]) Stable Continue inhaled steroids and as needed bronchodilator therapy Continue oxygen supplementation at 2 L as needed to maintain spO2 90-94%.  Essential hypertension Blood pressure is stable, soft diastolic. Continue Cardizem On Lasix drip  Hypokalemia Resolved, monitor  Extreme obesity with alveolar hypoventilation ([place]) Continue Trilogy NIV when sleeping. Follow up with Pulmonology as outpt, they consulted here, nothing further for them to offer Wt loss recommended.  GERD (gastroesophageal reflux disease) Stable Continue PPI  OSA  Secondary to morbid obesity Continue trilogy at bedtime  Morbid obesity with BMI of 50.0-59.9, adult ([place]) Patient has a BMI of 81.19 Complicates overall prognosis and care Lifestyle modification and exercise has been discussed with her in detail     \ Subjective:  thinks breathing and swelling improved, ambulates to bathroom w/o difficulty   Physical Exam: Vitals:  01/30/22 2019 01/30/22 2341 01/31/22 0509 01/31/22 0807  BP: (!) 125/44 127/63 (!) 110/44   Pulse: 90 91 81   Resp: '20 20 14   '$ Temp: 98.1 F (36.7 C) 98.3 F (36.8 C) 98.6 F (37  C)   TempSrc: Oral Oral Oral   SpO2: 94% 94% 96% 99%  Weight:   (!) 150.1 kg   Height:       General exam: awake, alert, no acute distress, obese HEENT: moist mucus membranes, hearing grossly normal, sounds less congested Respiratory system: CTA B, no expiratory wheezes, normal respiratory effort at rest, on room air Cardiovascular system: normal S1/S2, RRR, bilateral pedal edema improving, unable to assess JVD Central nervous system: A&O x4. no gross focal neurologic deficits, normal speech Extremities: moves all, trace LE edema, normal tone Skin: dry, intact, normal temperature Psychiatry: normal mood, congruent affect, judgement and insight appear normal    Family Communication: son updated @ bedside 9/13  Disposition: Status is: Inpatient Remains inpatient appropriate because: remains on IV diuresis with Lasix drip, ongoing evaluation     Planned Discharge Destination: Home    Author: Desma Maxim, MD 01/31/2022 10:59 AM  For on call review [email or web address].

## 2022-01-31 NOTE — Progress Notes (Addendum)
Rounding Note    Patient Name: Samantha Mason Date of Encounter: 01/31/2022  [facility] Cardiologist: Nelva Bush, MD   Subjective   Reports overall feeling much improved, Still mild shortness of breath on exertion but much quicker recovery Remains on room air Last for IV access x2 this morning, currently not on Lasix infusion Received dose of metolazone September 12  Inpatient Medications    Scheduled Meds:  budesonide  0.5 mg Nebulization BID   cholestyramine  4 g Oral QHS   dextromethorphan-guaiFENesin  1 tablet Oral BID   diltiazem  240 mg Oral Daily   enoxaparin (LOVENOX) injection  0.5 mg/kg Subcutaneous Q24H   loratadine  10 mg Oral Daily   pantoprazole  40 mg Oral Daily   potassium chloride  40 mEq Oral Q2000   rOPINIRole  1 mg Oral TID   sodium chloride flush  3 mL Intravenous Q12H   torsemide  40 mg Oral Daily   Continuous Infusions:  sodium chloride     PRN Meds: sodium chloride, acetaminophen, benzonatate, ipratropium-albuterol, promethazine, sodium chloride flush   Vital Signs    Vitals:   01/30/22 2341 01/31/22 0509 01/31/22 0807 01/31/22 1119  BP: 127/63 (!) 110/44  (!) 139/52  Pulse: 91 81  84  Resp: 20 14    Temp: 98.3 F (36.8 C) 98.6 F (37 C)  98.3 F (36.8 C)  TempSrc: Oral Oral    SpO2: 94% 96% 99% 94%  Weight:  (!) 150.1 kg    Height:        Intake/Output Summary (Last 24 hours) at 01/31/2022 1221 Last data filed at 01/31/2022 0616 Gross per 24 hour  Intake 255 ml  Output 1300 ml  Net -1045 ml      01/31/2022    5:09 AM 01/30/2022    4:44 AM 01/29/2022    6:32 AM  Last 3 Weights  Weight (lbs) 330 lb 14.4 oz 332 lb 14.3 oz 334 lb  Weight (kg) 150.095 kg 151 kg 151.5 kg      Telemetry    Normal sinus rhythm- Personally Reviewed  ECG     - Personally Reviewed  Physical Exam   GEN: No acute distress.  Obese Neck: Unable to estimate JVD Cardiac: RRR, no murmurs, rubs, or gallops.  Respiratory: Clear  to auscultation bilaterally. GI: Soft, nontender, non-distended  MS: No edema; No deformity. Neuro:  Nonfocal  Psych: Normal affect   Labs    High Sensitivity Troponin:   Recent Labs  Lab 01/23/22 1459 01/23/22 1801  TROPONINIHS 11 10     Chemistry Recent Labs  Lab 01/26/22 0500 01/27/22 0532 01/28/22 0859 01/29/22 0551 01/30/22 0436 01/31/22 0549  NA 134*   < > 134* 137  --  138  K 3.5   < > 4.2 4.4  --  4.6  CL 92*   < > 95* 98  --  99  CO2 30   < > 28 29  --  29  GLUCOSE 118*   < > 126* 101*  --  110*  BUN 63*   < > 66* 67*  --  66*  CREATININE 2.40*   < > 2.06* 2.01* 2.18* 1.96*  CALCIUM 9.2   < > 9.9 9.6  --  9.5  MG 1.9  --   --   --   --   --   GFRNONAA 21*   < > 26* 26* 24* 27*  ANIONGAP 12   < >  11 10  --  10   < > = values in this interval not displayed.    Lipids No results for input(s): "CHOL", "TRIG", "HDL", "LABVLDL", "LDLCALC", "CHOLHDL" in the last 168 hours.  Hematology Recent Labs  Lab 01/26/22 0500 01/27/22 0532 01/29/22 0551  WBC 12.6* 12.5* 9.9  RBC 3.94 3.94  3.94 3.79*  HGB 9.8* 9.9* 9.4*  HCT 32.0* 31.6* 30.2*  MCV 81.2 80.2 79.7*  MCH 24.9* 25.1* 24.8*  MCHC 30.6 31.3 31.1  RDW 15.3 15.4 15.9*  PLT 282 300 288   Thyroid No results for input(s): "TSH", "FREET4" in the last 168 hours.  BNPNo results for input(s): "BNP", "PROBNP" in the last 168 hours.  DDimer No results for input(s): "DDIMER" in the last 168 hours.   Radiology    CARDIAC CATHETERIZATION  Result Date: 01/30/2022 Conclusions: Borderline elevated left heart filling pressure (PCWP 18 mmHg). Severely elevated right heart filling pressure (mean RA 17 mmHg, RVEDP 16 mmHg). Mild pulmonary hypertension (mean PA 26 mmHg). Normal to supranormal cardiac output/index (Fick CO/CI 11.5 L/min, 4.7 L/min/m^2; thermodilution CO/CI 8/1 L/min, 3.3 L/min/m^2). Recommendations: Hemodynamics not consistent with cardiogenic pulmonary edema driving hypoxic respiratory failure.   Equalization of end-diastolic pressures can be seen with restrictive/constrictive physiology.  Continue gentle diuresis, as renal function tolerates.  Optimization of OHS/OSA and possible parenchymal lung disease recommended. Normal/supranormal cardiac output/index suggest high-output heart failure, which can be seen with chronic lung disease.  Consider further evaluation for AV-shunting and other potential metabolic/hematologic etiologies. Nelva Bush, MD [facility]   Cardiac Studies   Right heart catheterization performed yesterday Borderline elevated left heart filling pressure (PCWP 18 mmHg). Severely elevated right heart filling pressure (mean RA 17 mmHg, RVEDP 16 mmHg). Mild pulmonary hypertension (mean PA 26 mmHg). Normal to supranormal cardiac output/index (Fick CO/CI 11.5 L/min, 4.7 L/min/m^2; thermodilution CO/CI 8/1 L/min, 3.3 L/min/m^2).   Echo February 2023 . Left ventricular ejection fraction, by estimation, is 55 to 60%. The  left ventricle has normal function. Left ventricular endocardial border  not optimally defined to evaluate regional wall motion. There is mild left  ventricular hypertrophy. Left  ventricular diastolic parameters are indeterminate.   2. Right ventricular systolic function is normal. The right ventricular  size is normal. Tricuspid regurgitation signal is inadequate for assessing  PA pressure.   3. The mitral valve is normal in structure. No evidence of mitral valve  regurgitation. No evidence of mitral stenosis.   4. The aortic valve is normal in structure. Aortic valve regurgitation is  not visualized. No aortic stenosis is present.   5. The inferior vena cava is normal in size with greater than 50%  respiratory variability, suggesting right atrial pressure of 3 mmHg.    Patient Profile     71 y.o. female with history of morbid obesity, HFpEF, CKD 4, obesity hypoventilation syndrome presenting with worsening shortness of breath, being seen  for HFpEF.  Assessment & Plan    Acute on chronic respiratory failure Multifactorial in the setting of morbid obesity, obesity hypoventilation syndrome, asthma/COPD overlap, chronic diastolic CHF, anemia, chronic renal failure -This admission has been treated with Lasix infusion and albumin Has had aggressive diuresis over the course of the past week Right heart catheterization numbers reviewed from yesterday detailing borderline elevated left heart filling pressures -On admission was taking torsemide 40 daily, occasional extra torsemide Suspect  that she will need torsemide 40 twice daily with metolazone as needed for 3 pound weight gain   COPD/asthma The  setting of obesity hypoventilation On bronchodilators, steroids Improved symptoms with diuresis   Chronic kidney disease stage IIIb Renal function slowly improving consistent with cardiorenal syndrome Received dose of metolazone September 12 At discharge would resume torsemide 40 twice daily with metolazone as needed Follow-up with nephrology  Long discussion with her concerning transitioning from Lasix infusion to oral torsemide, discussed dosing, discussed titration of torsemide at home  Total encounter time more than 50 minutes  Greater than 50% was spent in counseling and coordination of care with the patient   For questions or updates, please contact [place] Please consult [email or web address] for contact info under        Signed, Ida Rogue, MD  01/31/2022, 12:21 PM

## 2022-01-31 NOTE — Progress Notes (Signed)
Mobility Specialist - Progress Note   01/31/22 0945  Mobility  Activity Ambulated with assistance in hallway  Level of Assistance Standby assist, set-up cues, supervision of patient - no hands on  Assistive Device None  Distance Ambulated (ft) 100 ft  Activity Response Tolerated well  $Mobility charge 1 Mobility   Pt sitting in recliner on RA upon arrival. Pt STS and ambulates in hallway SBA, no LOB. Pt returns to recliner with needs in reach.   Gretchen Short  Mobility Specialist  01/31/22 9:46 AM

## 2022-01-31 NOTE — Care Management Important Message (Signed)
Important Message  Patient Details  Name: Samantha Mason MRN: [id number] Date of Birth: 09/23/1950   Medicare Important Message Given:  Yes     Loann Quill 01/31/2022, 11:51 AM

## 2022-01-31 NOTE — Progress Notes (Signed)
[facility]  PROGRESS NOTE   Subjective:   Patient sitting up in chair States she feels some better today Currently on room air States she is able to ambulate to bathroom with less shortness of breath during recovery.  Her IV blew out therefore she is not on Lasix drip anymore.    Objective:  Vital signs: Blood pressure (!) 139/52, pulse 84, temperature 98.3 F (36.8 C), resp. rate 14, height '5\' 4"'$  (1.626 m), weight (!) 150.1 kg, SpO2 94 %.  Intake/Output Summary (Last 24 hours) at 01/31/2022 1624 Last data filed at 01/31/2022 1556 Gross per 24 hour  Intake 735 ml  Output 2075 ml  Net -1340 ml   Filed Weights   01/29/22 3474 01/30/22 0444 01/31/22 0509  Weight: (!) 151.5 kg (!) 151 kg (!) 150.1 kg     Physical Exam: General:  No acute distress  Head:  Normocephalic, atraumatic. Moist oral mucosal membranes  Eyes:  Anicteric  Lungs:  Diminished in bases, normal effort  Heart:  S1S2 no rubs  Abdomen:   Soft, nontender, bowel sounds present  Extremities: 1+ peripheral edema.  Neurologic:  Awake, alert, following commands  Skin:  No lesions  Access: None    Basic Metabolic Panel: Recent Labs  Lab 01/26/22 0500 01/27/22 0532 01/28/22 0859 01/29/22 0551 01/30/22 0436 01/31/22 0549  NA 134* 134* 134* 137  --  138  K 3.5 4.0 4.2 4.4  --  4.6  CL 92* 92* 95* 98  --  99  CO2 '30 30 28 29  '$ --  29  GLUCOSE 118* 101* 126* 101*  --  110*  BUN 63* 64* 66* 67*  --  66*  CREATININE 2.40* 2.28* 2.06* 2.01* 2.18* 1.96*  CALCIUM 9.2 9.3 9.9 9.6  --  9.5  MG 1.9  --   --   --   --   --     CBC: Recent Labs  Lab 01/25/22 0541 01/26/22 0500 01/27/22 0532 01/29/22 0551  WBC 11.7* 12.6* 12.5* 9.9  HGB 10.4* 9.8* 9.9* 9.4*  HCT 34.2* 32.0* 31.6* 30.2*  MCV 81.2 81.2 80.2 79.7*  PLT 307 282 300 288     Urinalysis: No results for input(s): "COLORURINE", "LABSPEC", "PHURINE", "GLUCOSEU", "HGBUR", "BILIRUBINUR", "KETONESUR", "PROTEINUR", "UROBILINOGEN",  "NITRITE", "LEUKOCYTESUR" in the last 72 hours.  Invalid input(s): "APPERANCEUR"    Imaging: CARDIAC CATHETERIZATION  Result Date: 01/30/2022 Conclusions: Borderline elevated left heart filling pressure (PCWP 18 mmHg). Severely elevated right heart filling pressure (mean RA 17 mmHg, RVEDP 16 mmHg). Mild pulmonary hypertension (mean PA 26 mmHg). Normal to supranormal cardiac output/index (Fick CO/CI 11.5 L/min, 4.7 L/min/m^2; thermodilution CO/CI 8/1 L/min, 3.3 L/min/m^2). Recommendations: Hemodynamics not consistent with cardiogenic pulmonary edema driving hypoxic respiratory failure.  Equalization of end-diastolic pressures can be seen with restrictive/constrictive physiology.  Continue gentle diuresis, as renal function tolerates.  Optimization of OHS/OSA and possible parenchymal lung disease recommended. Normal/supranormal cardiac output/index suggest high-output heart failure, which can be seen with chronic lung disease.  Consider further evaluation for AV-shunting and other potential metabolic/hematologic etiologies. Nelva Bush, MD [facility]    Medications:    sodium chloride      budesonide  0.5 mg Nebulization BID   cholestyramine  4 g Oral QHS   dextromethorphan-guaiFENesin  1 tablet Oral BID   diltiazem  240 mg Oral Daily   enoxaparin (LOVENOX) injection  0.5 mg/kg Subcutaneous Q24H   loratadine  10 mg Oral Daily   pantoprazole  40 mg Oral  Daily   potassium chloride  40 mEq Oral Q2000   rOPINIRole  1 mg Oral TID   sodium chloride flush  3 mL Intravenous Q12H   torsemide  40 mg Oral Daily    Assessment/ Plan:     Principal Problem:   Acute on chronic respiratory failure with hypoxia and hypercapnia (HCC) Active Problems:   Morbid obesity with BMI of 50.0-59.9, adult (HCC)   OSA    CKD (chronic kidney disease) stage 3, GFR 30-59 ml/min (HCC)   GERD (gastroesophageal reflux disease)   Essential hypertension   Chronic asthmatic bronchitis (HCC)   Acute on chronic  diastolic congestive heart failure (HCC)   Acute on chronic heart failure with preserved ejection fraction (HFpEF) (HCC)   Extreme obesity with alveolar hypoventilation (HCC)   Hypokalemia   Acute sinusitis  71 year old morbidly obese white female with history of obstructive sleep apnea on CPAP, chronic diastolic heart failure, hypertension, chronic kidney disease stage IIIb/IV now admitted with history of shortness of breath.   #1: Fluid overload/exacerbation of diastolic congestive heart failure:  -Restart home dose of torsemide 40 mg daily -Continue torsemide 5 mg twice a week -Volume status is acceptable at present.  #2: CKD stage IIIb most likely secondary to chronic hypertensive nephrosclerosis. will continue to monitor.         LOS: [facility] 9/14/20234:24 PM

## 2022-02-01 DIAGNOSIS — J449 Chronic obstructive pulmonary disease, unspecified: Secondary | ICD-10-CM | POA: Diagnosis not present

## 2022-02-01 DIAGNOSIS — J9622 Acute and chronic respiratory failure with hypercapnia: Secondary | ICD-10-CM | POA: Diagnosis not present

## 2022-02-01 DIAGNOSIS — J9621 Acute and chronic respiratory failure with hypoxia: Secondary | ICD-10-CM | POA: Diagnosis not present

## 2022-02-01 DIAGNOSIS — J9620 Acute and chronic respiratory failure, unspecified whether with hypoxia or hypercapnia: Secondary | ICD-10-CM | POA: Diagnosis not present

## 2022-02-01 DIAGNOSIS — I5033 Acute on chronic diastolic (congestive) heart failure: Secondary | ICD-10-CM | POA: Diagnosis not present

## 2022-02-01 DIAGNOSIS — E662 Morbid (severe) obesity with alveolar hypoventilation: Secondary | ICD-10-CM | POA: Diagnosis not present

## 2022-02-01 LAB — BASIC METABOLIC PANEL
Anion gap: 9 (ref 5–15)
BUN: 67 mg/dL — ABNORMAL HIGH (ref 8–23)
CO2: 31 mmol/L (ref 22–32)
Calcium: 9.5 mg/dL (ref 8.9–10.3)
Chloride: 98 mmol/L (ref 98–111)
Creatinine, Ser: 2.14 mg/dL — ABNORMAL HIGH (ref 0.44–1.00)
GFR, Estimated: 24 mL/min — ABNORMAL LOW (ref >60)
Glucose, Bld: 100 mg/dL — ABNORMAL HIGH (ref 70–99)
Potassium: 4.4 mmol/L (ref 3.5–5.1)
Sodium: 138 mmol/L (ref 135–145)

## 2022-02-01 LAB — LIPOPROTEIN A (LPA): Lipoprotein (a): 84.1 nmol/L — ABNORMAL HIGH (ref <75.0)

## 2022-02-01 MED ORDER — METOLAZONE 2.5 MG PO TABS: 2.5000 mg | ORAL_TABLET | ORAL | Status: DC

## 2022-02-01 MED ORDER — ALUM & MAG HYDROXIDE-SIMETH 200-200-20 MG/5ML PO SUSP
30.0000 mL | Freq: Four times a day (QID) | ORAL | Status: DC | PRN
  Administered 2022-02-01: 30 mL via ORAL
  Filled 2022-02-01: qty 30

## 2022-02-01 MED ORDER — TORSEMIDE 20 MG PO TABS: 40.0000 mg | ORAL_TABLET | Freq: Every day | ORAL | Status: DC

## 2022-02-01 NOTE — Progress Notes (Signed)
PT Cancellation Note  Patient Details Name: KYNDAHL JABLON MRN: [id number] DOB: 1951-05-18   Cancelled Treatment:    Reason Eval/Treat Not Completed: Other (comment).  Chart reviewed and attempted to see pt.  Upon entry to the room, pt stating that she is actively being discharge and awaiting MD orders to go home.  Deferred therapy per pt request at this time.  Pt already ambulated this AM.     Gwenlyn Saran, PT, DPT 02/01/22, 11:47 AM

## 2022-02-01 NOTE — Discharge Summary (Signed)
Samantha Mason JFH:[id number] DOB: 1950-09-13 DOA: 01/23/2022  PCP: Glean Hess, MD  Admit date: 01/23/2022 Discharge date: 02/01/2022  Time spent: 40 minutes  Recommendations for Outpatient Follow-up:  Pcp, cardiology, nephrology, and pulmonology f/u Bmp 1 week     Discharge Diagnoses:  Principal Problem:   Acute on chronic respiratory failure with hypoxia and hypercapnia (HCC) Active Problems:   Acute on chronic diastolic congestive heart failure (HCC)   CKD (chronic kidney disease) stage 3, GFR 30-59 ml/min (HCC)   Chronic asthmatic bronchitis ([place])   Acute sinusitis   Essential hypertension   Morbid obesity with BMI of 50.0-59.9, adult (HCC)   OSA    GERD (gastroesophageal reflux disease)   Acute on chronic heart failure with preserved ejection fraction (HFpEF) ([place])   Extreme obesity with alveolar hypoventilation (HCC)   Hypokalemia   Discharge Condition: stable  Diet recommendation: heart healthy  Filed Weights   01/31/22 0509 02/01/22 0614 02/01/22 0822  Weight: (!) 150.1 kg (!) 151.6 kg (!) 150.3 kg    History of present illness:  From admission h and p Samantha Mason is a 71 y.o. female with medical history significant for morbid obesity with BMI of 56.13, obstructive sleep apnea on CPAP, COPD with chronic respiratory failure on 2 L of oxygen as needed, chronic diastolic dysfunction CHF with last known LVEF of 55 to 60%, hypertension with stage IV chronic kidney disease who presents to the ER from her pulmonologist office for evaluation of worsening shortness of breath for 2 days. Patient complains of shortness of breath both at rest and with exertion associated with a dry cough.  She denies having any fever or chills.  She weighs herself daily and has not gained weight, says she has lost 2 pounds in the last 24 hours.  She has orthopnea that is chronic.  She has no leg swelling. She has no abdominal pain, no nausea, no vomiting, no headache, no dizziness, no  lightheadedness, no urinary frequency, no nocturia, no dysuria, no blurred vision, no focal deficit According to the patient she has had to use her home oxygen continuously for the last 2 days due to her shortness of breath and pulse oximetry of 88% on room air  Hospital Course:  Acute on chronic respiratory failure with hypoxia and hypercapnia (HCC) At baseline on Trilogy/NIV with nocturnal oxygen.  Presented with dyspnea and room air spO2 88% at pulmonology visit.  Appears due to volume overload, improved with diuresis.  Asthma seems stable. Now weaned off o2 at rest. Ambulates without difficulty. Feels back to baseline --Continue Trilogy when sleeping   Acute on chronic diastolic congestive heart failure (HCC) Acute hypoxia improving with diuresis, but volume status on exam difficult to assess.   --treated with Lasix drip, transitioned to home torsemide/metolazone --Cardiology also following, right heart cath performed, no sig cardiac dysfunction    CKD (chronic kidney disease) stage 3, GFR 30-59 ml/min (HCC) AKI superimposed on CKD stage IIIb Renal function recently worsening. Cr on admission was 2.08 and has maintained there suggesting progression to ckd4 Will f/u with outpt nephrology   Acute sinusitis Pt with sinus congestion, chills, leukocytosis. Treated by previous provider with azithromycin, now complete   Chronic asthmatic bronchitis ([place]) No exacerbation   Essential hypertension stable  Morbid obesity with alveolar hypoventilation ([place]) Continue Trilogy NIV when sleeping. Follow up with Pulmonology as outpt, they were consulted here, nothing further for them to offer Wt loss recommended.   GERD (gastroesophageal reflux disease) Stable  Continue PPI   OSA  Secondary to morbid obesity Continue trilogy at bedtime    Procedures: Right heart cath   Consultations: Cardiology, nephrology, pulmonology  Discharge Exam: Vitals:   02/01/22 0820 02/01/22 1202  BP:  (!) 150/65 (!) 112/41  Pulse: 89 82  Resp: (!) 9 19  Temp: 97.6 F (36.4 C) 97.7 F (36.5 C)  SpO2: 94% 95%    General exam: awake, alert, no acute distress, obese HEENT: moist mucus membranes, hearing grossly normal, sounds less congested Respiratory system: CTA B, no expiratory wheezes, normal respiratory effort at rest, on room air Cardiovascular system: normal S1/S2, RRR, bilateral pedal edema improving, unable to assess JVD Central nervous system: A&O x4. no gross focal neurologic deficits, normal speech Extremities: moves all, trace LE edema, normal tone Skin: dry, intact, normal temperature Psychiatry: normal mood, congruent affect, judgement and insight appear normal  Discharge Instructions   Discharge Instructions     Diet - low sodium heart healthy   Complete by: As directed    Increase activity slowly   Complete by: As directed       Allergies as of 02/01/2022       Reactions   Nuvigil [armodafinil] Hives   Penicillins Diarrhea, Nausea And Vomiting   Did it involve swelling of the face/tongue/throat, SOB, or low BP? no Did it involve sudden or severe rash/hives, skin peeling, or any reaction on the inside of your mouth or nose? No Did you need to seek medical attention at a hospital or doctor's office? No When did it last happen?  in her 69s    If all above answers are "NO", may proceed with cephalosporin use.   Spironolactone Itching   Gabapentin Other (See Comments)   "wiped her out, couldn't stay awake"   Melatonin Other (See Comments)   "Brain fog"   Provigil [modafinil] Hives   Entresto [sacubitril-valsartan] Itching   Lisinopril Cough        Medication List     STOP taking these medications    cholestyramine 4 g packet Commonly known as: QUESTRAN       TAKE these medications    acetaminophen 500 MG tablet Commonly known as: TYLENOL Take 1,000 mg by mouth every 6 (six) hours as needed.   budesonide 0.5 MG/2ML nebulizer  solution Commonly known as: PULMICORT TAKE 2 ML (0.5 MG TOTAL) BY NEBULIZATION TWICE A DAY   diltiazem 240 MG 24 hr capsule Commonly known as: CARDIZEM CD Take 1 capsule (240 mg total) by mouth daily.   ipratropium-albuterol 0.5-2.5 (3) MG/3ML Soln Commonly known as: DUONEB TAKE 3 MLS BY NEBULIZATION EVERY 6 (SIX) HOURS AS NEEDED. J45.40   Jardiance 10 MG Tabs tablet Generic drug: empagliflozin TAKE 1 TABLET BY MOUTH DAILY BEFORE BREAKFAST.   metolazone 2.5 MG tablet Commonly known as: ZAROXOLYN Take 1 tablet (2.5 mg total) by mouth 2 (two) times a week. Start taking on: February 04, 2022 What changed:  when to take this additional instructions   NON FORMULARY Bipap - nightly with Oxygen 2 Lpm   OXYGEN Inhale into the lungs. 2 LPM [place]   pantoprazole 40 MG tablet Commonly known as: PROTONIX TAKE 1 TABLET BY MOUTH EVERY DAY   Potassium Chloride ER 20 MEQ Tbcr Take 20 mEq by mouth every Monday, Wednesday, and Friday. With metolazone   Prevalite 4 g packet Generic drug: cholestyramine light Take 1 packet by mouth at bedtime.   rOPINIRole 1 MG tablet Commonly known as: REQUIP Take 1 tablet (  1 mg total) by mouth 3 (three) times daily.   torsemide 20 MG tablet Commonly known as: DEMADEX Take 2 tablets (40 mg total) by mouth daily.       Allergies  Allergen Reactions   Nuvigil [Armodafinil] Hives   Penicillins Diarrhea and Nausea And Vomiting    Did it involve swelling of the face/tongue/throat, SOB, or low BP? no Did it involve sudden or severe rash/hives, skin peeling, or any reaction on the inside of your mouth or nose? No Did you need to seek medical attention at a hospital or doctor's office? No When did it last happen?  in her 53s    If all above answers are "NO", may proceed with cephalosporin use.    Spironolactone Itching   Gabapentin Other (See Comments)    "wiped her out, couldn't stay awake"   Melatonin Other (See Comments)    "Brain fog"    Provigil [Modafinil] Hives   Entresto [Sacubitril-Valsartan] Itching   Lisinopril Cough    Follow-up Information     Your primary care provider, pulmonologist, nephrologist, and cardiologist Follow up.                   The results of significant diagnostics from this hospitalization (including imaging, microbiology, ancillary and laboratory) are listed below for reference.    Significant Diagnostic Studies: CARDIAC CATHETERIZATION  Result Date: 01/30/2022 Conclusions: Borderline elevated left heart filling pressure (PCWP 18 mmHg). Severely elevated right heart filling pressure (mean RA 17 mmHg, RVEDP 16 mmHg). Mild pulmonary hypertension (mean PA 26 mmHg). Normal to supranormal cardiac output/index (Fick CO/CI 11.5 L/min, 4.7 L/min/m^2; thermodilution CO/CI 8/1 L/min, 3.3 L/min/m^2). Recommendations: Hemodynamics not consistent with cardiogenic pulmonary edema driving hypoxic respiratory failure.  Equalization of end-diastolic pressures can be seen with restrictive/constrictive physiology.  Continue gentle diuresis, as renal function tolerates.  Optimization of OHS/OSA and possible parenchymal lung disease recommended. Normal/supranormal cardiac output/index suggest high-output heart failure, which can be seen with chronic lung disease.  Consider further evaluation for AV-shunting and other potential metabolic/hematologic etiologies. Nelva Bush, MD [facility]  CT CHEST WO CONTRAST  Result Date: 01/23/2022 CLINICAL DATA:  Worsening shortness of breath. EXAM: CT CHEST WITHOUT CONTRAST TECHNIQUE: Multidetector CT imaging of the chest was performed following the standard protocol without IV contrast. RADIATION DOSE REDUCTION: This exam was performed according to the departmental dose-optimization program which includes automated exposure control, adjustment of the mA and/or kV according to patient size and/or use of iterative reconstruction technique. COMPARISON:  February 13, 2021  FINDINGS: Cardiovascular: There is mild to moderate severity calcification of the aortic arch, without evidence of aortic aneurysm. Normal heart size. No pericardial effusion. Mediastinum/Nodes: No enlarged mediastinal or axillary lymph nodes. Thyroid gland, trachea, and esophagus demonstrate no significant findings. Lungs/Pleura: There is mild lingular, anterior right middle lobe and posterior right lower lobe linear atelectasis. There is no evidence of a pleural effusion or pneumothorax. Upper Abdomen: Multiple surgical clips are seen within the gallbladder fossa. Noninflamed diverticula are seen along the splenic flexure. Musculoskeletal: Multilevel degenerative changes are seen throughout the thoracic spine. IMPRESSION: 1. Mild lingular, anterior right middle lobe and posterior right lower lobe linear atelectasis. 2. Evidence of prior cholecystectomy. 3. Colonic diverticulosis. Aortic Atherosclerosis (ICD10-I70.0). Electronically Signed   By: Virgina Norfolk M.D.   On: 01/23/2022 20:40   DG Chest 2 View  Result Date: 01/23/2022 CLINICAL DATA:  Shortness of breath EXAM: CHEST - 2 VIEW COMPARISON:  Chest x-ray dated July 17, 2021 FINDINGS:  Patient body habitus limits evaluation. Heart size and mediastinal contours are within normal limits. Mild bibasilar opacities. Linear opacity of the right lung base, similar to prior exam and likely due to scarring or atelectasis. Lungs otherwise clear. No pleural effusion or evidence of pneumothorax. IMPRESSION: Mild bibasilar opacities, likely due atelectasis. Electronically Signed   By: Yetta Glassman M.D.   On: 01/23/2022 15:25    Microbiology: Recent Results (from the past 240 hour(s))  Resp Panel by RT-PCR (Flu A&B, Covid) Anterior Nasal Swab     Status: None   Collection Time: 01/23/22  6:01 PM   Specimen: Anterior Nasal Swab  Result Value Ref Range Status   SARS Coronavirus 2 by RT PCR NEGATIVE NEGATIVE Final    Comment: (NOTE) SARS-CoV-2 target  nucleic acids are NOT DETECTED.  The SARS-CoV-2 RNA is generally detectable in upper respiratory specimens during the acute phase of infection. The lowest concentration of SARS-CoV-2 viral copies this assay can detect is 138 copies/mL. A negative result does not preclude SARS-Cov-2 infection and should not be used as the sole basis for treatment or other patient management decisions. A negative result may occur with  improper specimen collection/handling, submission of specimen other than nasopharyngeal swab, presence of viral mutation(s) within the areas targeted by this assay, and inadequate number of viral copies(<138 copies/mL). A negative result must be combined with clinical observations, patient history, and epidemiological information. The expected result is Negative.  Fact Sheet for Patients:  EntrepreneurPulse.com.au  Fact Sheet for Healthcare Providers:  IncredibleEmployment.be  This test is no t yet approved or cleared by the [place] FDA and  has been authorized for detection and/or diagnosis of SARS-CoV-2 by FDA under an Emergency Use Authorization (EUA). This EUA will remain  in effect (meaning this test can be used) for the duration of the COVID-19 declaration under Section 564(b)(1) of the Act, 21 U.S.C.section 360bbb-3(b)(1), unless the authorization is terminated  or revoked sooner.       Influenza A by PCR NEGATIVE NEGATIVE Final   Influenza B by PCR NEGATIVE NEGATIVE Final    Comment: (NOTE) The Xpert Xpress SARS-CoV-2/FLU/RSV plus assay is intended as an aid in the diagnosis of influenza from Nasopharyngeal swab specimens and should not be used as a sole basis for treatment. Nasal washings and aspirates are unacceptable for Xpert Xpress SARS-CoV-2/FLU/RSV testing.  Fact Sheet for Patients: EntrepreneurPulse.com.au  Fact Sheet for Healthcare  Providers: IncredibleEmployment.be  This test is not yet approved or cleared by the [place] FDA and has been authorized for detection and/or diagnosis of SARS-CoV-2 by FDA under an Emergency Use Authorization (EUA). This EUA will remain in effect (meaning this test can be used) for the duration of the COVID-19 declaration under Section 564(b)(1) of the Act, 21 U.S.C. section 360bbb-3(b)(1), unless the authorization is terminated or revoked.  Performed at [facility], [address] [place], [place] [address]      Labs: Basic Metabolic Panel: Recent Labs  Lab 01/26/22 0500 01/27/22 0532 01/28/22 0859 01/29/22 0551 01/30/22 0436 01/31/22 0549 02/01/22 0519  NA 134* 134* 134* 137  --  138 138  K 3.5 4.0 4.2 4.4  --  4.6 4.4  CL 92* 92* 95* 98  --  99 98  CO2 '30 30 28 29  '$ --  29 31  GLUCOSE 118* 101* 126* 101*  --  110* 100*  BUN 63* 64* 66* 67*  --  66* 67*  CREATININE 2.40* 2.28* 2.06* 2.01* 2.18* 1.96* 2.14*  CALCIUM 9.2 9.3 9.9 9.6  --  9.5 9.5  MG 1.9  --   --   --   --   --   --    Liver Function Tests: No results for input(s): "AST", "ALT", "ALKPHOS", "BILITOT", "PROT", "ALBUMIN" in the last 168 hours. No results for input(s): "LIPASE", "AMYLASE" in the last 168 hours. No results for input(s): "AMMONIA" in the last 168 hours. CBC: Recent Labs  Lab 01/26/22 0500 01/27/22 0532 01/29/22 0551  WBC 12.6* 12.5* 9.9  HGB 9.8* 9.9* 9.4*  HCT 32.0* 31.6* 30.2*  MCV 81.2 80.2 79.7*  PLT 282 300 288   Cardiac Enzymes: No results for input(s): "CKTOTAL", "CKMB", "CKMBINDEX", "TROPONINI" in the last 168 hours. BNP: BNP (last 3 results) Recent Labs    07/15/21 0436 11/12/21 1519 01/23/22 1459  BNP 103.2* 16.2 19.6    ProBNP (last 3 results) No results for input(s): "PROBNP" in the last 8760 hours.  CBG: No results for input(s): "GLUCAP" in the last 168 hours.     Signed:  Desma Maxim MD.  [facility] 02/01/2022, 12:08 PM

## 2022-02-01 NOTE — Progress Notes (Signed)
Rounding Note    Patient Name: Samantha Mason Date of Encounter: 02/01/2022  [facility] Cardiologist: Nelva Bush, MD   Subjective   Reports feeling well, Feels her breathing is at her baseline No significant leg swelling On room air Was transitioned to torsemide 40 daily starting yesterday  Inpatient Medications    Scheduled Meds:  budesonide  0.5 mg Nebulization BID   cholestyramine  4 g Oral QHS   dextromethorphan-guaiFENesin  1 tablet Oral BID   diltiazem  240 mg Oral Daily   enoxaparin (LOVENOX) injection  0.5 mg/kg Subcutaneous Q24H   loratadine  10 mg Oral Daily   pantoprazole  40 mg Oral Daily   potassium chloride  40 mEq Oral Q2000   rOPINIRole  1 mg Oral TID   sodium chloride flush  3 mL Intravenous Q12H   torsemide  40 mg Oral Daily   Continuous Infusions:  sodium chloride Stopped (01/31/22 1000)   PRN Meds: sodium chloride, acetaminophen, alum & mag hydroxide-simeth, benzonatate, ipratropium-albuterol, promethazine, sodium chloride flush   Vital Signs    Vitals:   02/01/22 0614 02/01/22 0728 02/01/22 0820 02/01/22 0822  BP:   (!) 150/65   Pulse:   89   Resp:   (!) 9   Temp:   97.6 F (36.4 C)   TempSrc:      SpO2:  95% 94%   Weight: (!) 151.6 kg   (!) 150.3 kg  Height:        Intake/Output Summary (Last 24 hours) at 02/01/2022 1135 Last data filed at 02/01/2022 1022 Gross per 24 hour  Intake 1200 ml  Output 2275 ml  Net -1075 ml      02/01/2022    8:22 AM 02/01/2022    6:14 AM 01/31/2022    5:09 AM  Last 3 Weights  Weight (lbs) 331 lb 4.8 oz 334 lb 3.5 oz 330 lb 14.4 oz  Weight (kg) 150.277 kg 151.6 kg 150.095 kg      Telemetry    Normal sinus rhythm- Personally Reviewed  ECG     - Personally Reviewed  Physical Exam   GEN: No acute distress.  Obese Neck: No JVD Cardiac: RRR, no murmurs, rubs, or gallops.  Respiratory: Clear to auscultation bilaterally. GI: Soft, nontender, non-distended  MS: No edema; No  deformity. Neuro:  Nonfocal  Psych: Normal affect   Labs    High Sensitivity Troponin:   Recent Labs  Lab 01/23/22 1459 01/23/22 1801  TROPONINIHS 11 10     Chemistry Recent Labs  Lab 01/26/22 0500 01/27/22 0532 01/29/22 0551 01/30/22 0436 01/31/22 0549 02/01/22 0519  NA 134*   < > 137  --  138 138  K 3.5   < > 4.4  --  4.6 4.4  CL 92*   < > 98  --  99 98  CO2 30   < > 29  --  29 31  GLUCOSE 118*   < > 101*  --  110* 100*  BUN 63*   < > 67*  --  66* 67*  CREATININE 2.40*   < > 2.01* 2.18* 1.96* 2.14*  CALCIUM 9.2   < > 9.6  --  9.5 9.5  MG 1.9  --   --   --   --   --   GFRNONAA 21*   < > 26* 24* 27* 24*  ANIONGAP 12   < > 10  --  10 9   < > =  values in this interval not displayed.    Lipids No results for input(s): "CHOL", "TRIG", "HDL", "LABVLDL", "LDLCALC", "CHOLHDL" in the last 168 hours.  Hematology Recent Labs  Lab 01/26/22 0500 01/27/22 0532 01/29/22 0551  WBC 12.6* 12.5* 9.9  RBC 3.94 3.94  3.94 3.79*  HGB 9.8* 9.9* 9.4*  HCT 32.0* 31.6* 30.2*  MCV 81.2 80.2 79.7*  MCH 24.9* 25.1* 24.8*  MCHC 30.6 31.3 31.1  RDW 15.3 15.4 15.9*  PLT 282 300 288   Thyroid No results for input(s): "TSH", "FREET4" in the last 168 hours.  BNPNo results for input(s): "BNP", "PROBNP" in the last 168 hours.  DDimer No results for input(s): "DDIMER" in the last 168 hours.   Radiology    CARDIAC CATHETERIZATION  Result Date: 01/30/2022 Conclusions: Borderline elevated left heart filling pressure (PCWP 18 mmHg). Severely elevated right heart filling pressure (mean RA 17 mmHg, RVEDP 16 mmHg). Mild pulmonary hypertension (mean PA 26 mmHg). Normal to supranormal cardiac output/index (Fick CO/CI 11.5 L/min, 4.7 L/min/m^2; thermodilution CO/CI 8/1 L/min, 3.3 L/min/m^2). Recommendations: Hemodynamics not consistent with cardiogenic pulmonary edema driving hypoxic respiratory failure.  Equalization of end-diastolic pressures can be seen with restrictive/constrictive physiology.   Continue gentle diuresis, as renal function tolerates.  Optimization of OHS/OSA and possible parenchymal lung disease recommended. Normal/supranormal cardiac output/index suggest high-output heart failure, which can be seen with chronic lung disease.  Consider further evaluation for AV-shunting and other potential metabolic/hematologic etiologies. Nelva Bush, MD [facility]   Cardiac Studies   Right heart catheterization performed yesterday Borderline elevated left heart filling pressure (PCWP 18 mmHg). Severely elevated right heart filling pressure (mean RA 17 mmHg, RVEDP 16 mmHg). Mild pulmonary hypertension (mean PA 26 mmHg). Normal to supranormal cardiac output/index (Fick CO/CI 11.5 L/min, 4.7 L/min/m^2; thermodilution CO/CI 8/1 L/min, 3.3 L/min/m^2).     Echo February 2023 . Left ventricular ejection fraction, by estimation, is 55 to 60%. The  left ventricle has normal function. Left ventricular endocardial border  not optimally defined to evaluate regional wall motion. There is mild left  ventricular hypertrophy. Left  ventricular diastolic parameters are indeterminate.   2. Right ventricular systolic function is normal. The right ventricular  size is normal. Tricuspid regurgitation signal is inadequate for assessing  PA pressure.   3. The mitral valve is normal in structure. No evidence of mitral valve  regurgitation. No evidence of mitral stenosis.   4. The aortic valve is normal in structure. Aortic valve regurgitation is  not visualized. No aortic stenosis is present.   5. The inferior vena cava is normal in size with greater than 50%  respiratory variability, suggesting right atrial pressure of 3 mmHg.   Patient Profile     71 y.o. female with history of morbid obesity, HFpEF, CKD 4, obesity hypoventilation syndrome presenting with worsening shortness of breath, being seen for HFpEF.  Assessment & Plan    Acute on chronic respiratory failure in the setting of  morbid obesity, obesity hypoventilation syndrome, asthma/COPD overlap, chronic diastolic CHF, anemia, chronic renal failure -Improvement in symptoms with Lasix infusion and albumin Weight down approximately 7 pounds since admission -Has been transitioned to torsemide 40 daily, with plan for metolazone 5 mg twice a week and extra torsemide for milligram after lunch for 3 pound weight gain -Feels that she is at her baseline  COPD/asthma In  setting of obesity hypoventilation On bronchodilators, steroids Improved with diuresis   Chronic kidney disease stage IIIb Suspect component of cardiorenal syndrome Received dose of  metolazone September 12 Plan for torsemide and metolazone as above, close follow-up with nephrology   Total encounter time more than 35 minutes  Greater than 50% was spent in counseling and coordination of care with the patient   For questions or updates, please contact [place] Please consult [email or web address] for contact info under        Signed, Ida Rogue, MD  02/01/2022, 11:35 AM

## 2022-02-01 NOTE — Consult Note (Signed)
   [facility] CM Inpatient Consult   02/01/2022  DEASIA CHIU [birth year] [id number]  [place] [facility] [ACO] Patient: Medicare ACO REACH  Primary Care Provider:  Glean Hess, MD, [facility], is a provider listed for the Transition of Care [TOC] follow up for post hospital needs   Patient screened for length of stay hospitalization with noted high risk score for unplanned readmission risk and  to assess for potential [place] Management service needs for post hospital transition.  Review of patient's medical record reveals patient is recommended for CRP2. Patient with a history with [facility] Care Management, Assessed for readmission prevention.  Patient was called at the number provider and left a HIPPA appropriate voicemail and text message.  Patient returned called and explained post hospital TOC follow up call and care coordination available. Patient was appreciative and generous however, she will decide if she needs further assistance with the [facility] outreach that's anticipated. She states, "I know I need to walk more and I am planning to walk down my hallway which is pretty long especially if it's too hot to walk out side."   Plan:  No other needs noted.  For questions contact:   Natividad Brood, RN BSN [facility] Liaison  [phone] business mobile phone Toll free office [phone]  Fax number: [phone] [email or web address]

## 2022-02-01 NOTE — Progress Notes (Signed)
[facility]  PROGRESS NOTE   Subjective:   Patient sitting up in chair States she feels some better today Currently on room air      Objective:  Vital signs: Blood pressure (!) 112/41, pulse 82, temperature 97.7 F (36.5 C), resp. rate 19, height '5\' 4"'$  (1.626 m), weight (!) 150.3 kg, SpO2 95 %.  Intake/Output Summary (Last 24 hours) at 02/01/2022 1356 Last data filed at 02/01/2022 1022 Gross per 24 hour  Intake 720 ml  Output 1800 ml  Net -1080 ml    Filed Weights   01/31/22 0509 02/01/22 0614 02/01/22 0822  Weight: (!) 150.1 kg (!) 151.6 kg (!) 150.3 kg     Physical Exam: General:  No acute distress  Head:  Normocephalic, atraumatic. Moist oral mucosal membranes  Eyes:  Anicteric  Lungs:  Diminished in bases, normal effort  Heart:  S1S2 no rubs  Abdomen:   Soft, nontender, bowel sounds present  Extremities: 1+ peripheral edema.  Neurologic:  Awake, alert, following commands  Skin:  No lesions  Access: None    Basic Metabolic Panel: Recent Labs  Lab 01/26/22 0500 01/27/22 0532 01/28/22 0859 01/29/22 0551 01/30/22 0436 01/31/22 0549 02/01/22 0519  NA 134* 134* 134* 137  --  138 138  K 3.5 4.0 4.2 4.4  --  4.6 4.4  CL 92* 92* 95* 98  --  99 98  CO2 '30 30 28 29  '$ --  29 31  GLUCOSE 118* 101* 126* 101*  --  110* 100*  BUN 63* 64* 66* 67*  --  66* 67*  CREATININE 2.40* 2.28* 2.06* 2.01* 2.18* 1.96* 2.14*  CALCIUM 9.2 9.3 9.9 9.6  --  9.5 9.5  MG 1.9  --   --   --   --   --   --      CBC: Recent Labs  Lab 01/26/22 0500 01/27/22 0532 01/29/22 0551  WBC 12.6* 12.5* 9.9  HGB 9.8* 9.9* 9.4*  HCT 32.0* 31.6* 30.2*  MCV 81.2 80.2 79.7*  PLT 282 300 288      Urinalysis: No results for input(s): "COLORURINE", "LABSPEC", "PHURINE", "GLUCOSEU", "HGBUR", "BILIRUBINUR", "KETONESUR", "PROTEINUR", "UROBILINOGEN", "NITRITE", "LEUKOCYTESUR" in the last 72 hours.  Invalid input(s): "APPERANCEUR"    Imaging: No results  found.   Medications:    sodium chloride Stopped (01/31/22 1000)    budesonide  0.5 mg Nebulization BID   cholestyramine  4 g Oral QHS   dextromethorphan-guaiFENesin  1 tablet Oral BID   diltiazem  240 mg Oral Daily   enoxaparin (LOVENOX) injection  0.5 mg/kg Subcutaneous Q24H   loratadine  10 mg Oral Daily   pantoprazole  40 mg Oral Daily   potassium chloride  40 mEq Oral Q2000   rOPINIRole  1 mg Oral TID   sodium chloride flush  3 mL Intravenous Q12H   torsemide  40 mg Oral Daily    Assessment/ Plan:     Principal Problem:   Acute on chronic respiratory failure with hypoxia and hypercapnia (HCC) Active Problems:   Morbid obesity with BMI of 50.0-59.9, adult (HCC)   OSA    CKD (chronic kidney disease) stage 3, GFR 30-59 ml/min (HCC)   GERD (gastroesophageal reflux disease)   Essential hypertension   Chronic asthmatic bronchitis (HCC)   Acute on chronic diastolic congestive heart failure (HCC)   Acute on chronic heart failure with preserved ejection fraction (HFpEF) ([place])   Extreme obesity with alveolar hypoventilation (HCC)   Hypokalemia  Acute sinusitis  71 year old morbidly obese white female with history of obstructive sleep apnea on CPAP, chronic diastolic heart failure, hypertension, chronic kidney disease stage IIIb/IV now admitted with history of shortness of breath.   #1: Fluid overload/exacerbation of diastolic congestive heart failure:  -Restart home dose of torsemide 40 mg daily -Continue torsemide 5 mg twice a week -Volume status is acceptable at present.  #2: CKD stage IIIb most likely secondary to chronic hypertensive nephrosclerosis. will continue to monitor.         LOS: [facility] 9/15/20231:56 PM

## 2022-02-04 ENCOUNTER — Telehealth: Payer: Self-pay

## 2022-02-04 NOTE — Telephone Encounter (Signed)
Transition Care Management Follow-up Telephone Call Date of discharge and from where: 09/15/2023[facility] How have you been since you were released from the hospital? Doing Good Any questions or concerns? No  Items Reviewed: Did the pt receive and understand the discharge instructions provided? Yes  Medications obtained and verified? Yes  Other? No  Any new allergies since your discharge? No  Dietary orders reviewed? Yes Do you have support at home? Yes   Home Care and Equipment/Supplies: Were home health services ordered? not applicable If so, what is the name of the agency? N/A  Has the agency set up a time to come to the patient's home? not applicable Were any new equipment or medical supplies ordered?  No What is the name of the medical supply agency? N/A Were you able to get the supplies/equipment? not applicable Do you have any questions related to the use of the equipment or supplies? No  Functional Questionnaire: (I = Independent and D = Dependent) ADLs: I  Bathing/Dressing- I  Meal Prep- I  Eating- I  Maintaining continence- I  Transferring/Ambulation- I  Managing Meds- I  Follow up appointments reviewed:  PCP Hospital f/u appt confirmed? Yes  Scheduled to see Dr. Army Melia on 02/13/2022 @ 2 PM. [facility] f/u appt confirmed? Yes  Scheduled to see Cadence Furth, [place] on 03/15/2022 @ 2 PM. Are transportation arrangements needed? No  If their condition worsens, is the pt aware to call PCP or go to the Emergency Dept.? Yes Was the patient provided with contact information for the PCP's office or ED? Yes Was to pt encouraged to call back with questions or concerns? Yes

## 2022-02-07 ENCOUNTER — Other Ambulatory Visit: Payer: Self-pay | Admitting: Family

## 2022-02-07 MED ORDER — POTASSIUM CHLORIDE ER 20 MEQ PO TBCR: 20.0000 meq | EXTENDED_RELEASE_TABLET | ORAL | 0 refills | Status: DC

## 2022-02-07 NOTE — Progress Notes (Signed)
Update potassium usage per Ventricle HF provider

## 2022-02-13 ENCOUNTER — Encounter: Payer: Self-pay | Admitting: Internal Medicine

## 2022-02-13 ENCOUNTER — Ambulatory Visit (INDEPENDENT_AMBULATORY_CARE_PROVIDER_SITE_OTHER): Payer: Medicare Other | Admitting: Internal Medicine

## 2022-02-13 VITALS — BP 130/70 | HR 96 | Ht 64.0 in | Wt 332.0 lb

## 2022-02-13 DIAGNOSIS — I700 Atherosclerosis of aorta: Secondary | ICD-10-CM | POA: Diagnosis not present

## 2022-02-13 DIAGNOSIS — I5033 Acute on chronic diastolic (congestive) heart failure: Secondary | ICD-10-CM

## 2022-02-13 DIAGNOSIS — G2581 Restless legs syndrome: Secondary | ICD-10-CM | POA: Diagnosis not present

## 2022-02-13 DIAGNOSIS — N184 Chronic kidney disease, stage 4 (severe): Secondary | ICD-10-CM | POA: Diagnosis not present

## 2022-02-13 MED ORDER — PROMETHAZINE HCL 25 MG PO TABS: ORAL_TABLET | Freq: Four times a day (QID) | ORAL | 0 refills | Status: DC | PRN

## 2022-02-13 MED ORDER — ROPINIROLE HCL 1 MG PO TABS: 1.0000 mg | ORAL_TABLET | Freq: Three times a day (TID) | ORAL | 2 refills | Status: DC

## 2022-02-13 NOTE — Progress Notes (Signed)
Date:  02/13/2022   Name:  Samantha Mason   DOB:  [birth year]   MRN:  [id number]   Chief Complaint: Hospitalization Follow-up (Pt feels ok but is tired ) Hospital follow up. Admitted to [facility] 9/6 to 02/01/22.  TOC call done on 02/04/22. She is doing well.  She is tolerating all medications.  Some nausea at times and would like a refill on phenergan.  Weight is stable.  Discharge Diagnoses:  Principal Problem:   Acute on chronic respiratory failure with hypoxia and hypercapnia (HCC) Active Problems:   Acute on chronic diastolic congestive heart failure (HCC)   CKD (chronic kidney disease) stage 3, GFR 30-59 ml/min (HCC)   Chronic asthmatic bronchitis (HCC)   Acute sinusitis   Essential hypertension   Morbid obesity with BMI of 50.0-59.9, adult (HCC)   OSA    GERD (gastroesophageal reflux disease)   Acute on chronic heart failure with preserved ejection fraction (HFpEF) ([place])   Extreme obesity with alveolar hypoventilation ([place])   Hypokalemia   Hospital Course:  Acute on chronic respiratory failure with hypoxia and hypercapnia (HCC) At baseline on Trilogy/NIV with nocturnal oxygen.  Presented with dyspnea and room air spO2 88% at pulmonology visit.  Appears due to volume overload, improved with diuresis.  Asthma seems stable. Now weaned off o2 at rest. Ambulates without difficulty. Feels back to baseline --Continue Trilogy when sleeping   Acute on chronic diastolic congestive heart failure (HCC) Acute hypoxia improving with diuresis, but volume status on exam difficult to assess.   --treated with Lasix drip, transitioned to home torsemide/metolazone --Cardiology also following, right heart cath performed, no sig cardiac dysfunction    CKD (chronic kidney disease) stage 3, GFR 30-59 ml/min (HCC) AKI superimposed on CKD stage IIIb Renal function recently worsening. Cr on admission was 2.08 and has maintained there suggesting progression to ckd4 Will f/u with outpt nephrology    Acute sinusitis Pt with sinus congestion, chills, leukocytosis. Treated by previous provider with azithromycin, now complete   Chronic asthmatic bronchitis ([place]) No exacerbation   Essential hypertension stable   Morbid obesity with alveolar hypoventilation ([place]) Continue Trilogy NIV when sleeping. Follow up with Pulmonology as outpt, they were consulted here, nothing further for them to offer Wt loss recommended.   GERD (gastroesophageal reflux disease) Stable Continue PPI   OSA  Secondary to morbid obesity Continue trilogy at bedtime     Procedures: Right heart cath    Consultations: Cardiology, nephrology, pulmonology  HPI  Lab Results  Component Value Date   NA 138 02/01/2022   K 4.4 02/01/2022   CO2 31 02/01/2022   GLUCOSE 100 (H) 02/01/2022   BUN 67 (H) 02/01/2022   CREATININE 2.14 (H) 02/01/2022   CALCIUM 9.5 02/01/2022   EGFR 29 12/18/2021   GFRNONAA 24 (L) 02/01/2022   Lab Results  Component Value Date   CHOL 160 07/16/2021   HDL 81 07/16/2021   LDLCALC 68 07/16/2021   TRIG 57 07/16/2021   CHOLHDL 2.0 07/16/2021   Lab Results  Component Value Date   TSH 0.488 09/20/2020   Lab Results  Component Value Date   HGBA1C 5.4 07/15/2021   Lab Results  Component Value Date   WBC 9.9 01/29/2022   HGB 9.4 (L) 01/29/2022   HCT 30.2 (L) 01/29/2022   MCV 79.7 (L) 01/29/2022   PLT 288 01/29/2022   Lab Results  Component Value Date   ALT 10 03/05/2021   AST 16 03/05/2021   ALKPHOS 71  03/05/2021   BILITOT 0.6 03/05/2021   Lab Results  Component Value Date   VD25OH 24.39 (L) 03/10/2021     Review of Systems  Constitutional:  Negative for chills, diaphoresis, fever and unexpected weight change.  HENT:  Negative for trouble swallowing.   Eyes:  Negative for visual disturbance.  Respiratory:  Positive for cough, chest tightness, shortness of breath and wheezing.   Cardiovascular:  Positive for leg swelling. Negative for chest pain and  palpitations.  Gastrointestinal:  Positive for nausea. Negative for anal bleeding, blood in stool and vomiting.  Genitourinary:  Negative for dysuria and urgency.  Musculoskeletal:  Positive for myalgias (restless legs).  Neurological:  Negative for dizziness, light-headedness and headaches.  Psychiatric/Behavioral:  Negative for dysphoric mood and sleep disturbance. The patient is not nervous/anxious.     Patient Active Problem List   Diagnosis Date Noted   Hypokalemia 01/26/2022   SOB (shortness of breath)    Extreme obesity with alveolar hypoventilation (HCC)    CKD (chronic kidney disease) stage 4, GFR 15-29 ml/min (HCC) 01/23/2022   Chronic respiratory failure with hypoxia and hypercapnia ([place]) 10/15/2021   Acute on chronic heart failure with preserved ejection fraction (HFpEF) ([place]) 08/27/2021   Hyponatremia 07/26/2021   Insomnia 07/22/2021   Acute on chronic respiratory failure with hypoxia and hypercapnia (HCC) 07/15/2021   Chronic asthmatic bronchitis ([place]) 07/15/2021   Morbid obesity ([place]) 07/15/2021   Myocardial injury 07/15/2021   Acute on chronic diastolic congestive heart failure ([place])    History of stroke 11/17/2020   Ovarian failure 02/21/2020   Primary osteoarthritis of right knee 02/13/2019   S/P total knee arthroplasty, left 02/13/2019   Aortic atherosclerosis ([place]) 08/01/2018   Obstructive lung disease (generalized) ([place]) 07/24/2018   B12 deficiency anemia 07/07/2018   Benign neoplasm of ascending colon    Severe persistent asthma 02/13/2018   Vitamin D deficiency 03/17/2017   Prediabetes 03/14/2017   Microcytic anemia 03/14/2017   Irritable bowel syndrome with diarrhea 03/12/2017   Allergic rhinitis 03/12/2017   Restless leg syndrome 03/12/2017   GERD (gastroesophageal reflux disease) 10/16/2016   Essential hypertension 10/16/2016   Morbid obesity with BMI of 50.0-59.9, adult ([place]) 12/08/2014   OSA  12/08/2014   Shortness of breath 04/07/2014     Allergies  Allergen Reactions   Nuvigil [Armodafinil] Hives   Penicillins Diarrhea and Nausea And Vomiting    Did it involve swelling of the face/tongue/throat, SOB, or low BP? no Did it involve sudden or severe rash/hives, skin peeling, or any reaction on the inside of your mouth or nose? No Did you need to seek medical attention at a hospital or doctor's office? No When did it last happen?  in her 70s    If all above answers are "NO", may proceed with cephalosporin use.    Spironolactone Itching   Gabapentin Other (See Comments)    "wiped her out, couldn't stay awake"   Melatonin Other (See Comments)    "Brain fog"   Provigil [Modafinil] Hives   Entresto [Sacubitril-Valsartan] Itching   Lisinopril Cough    Past Surgical History:  Procedure Laterality Date   CHOLECYSTECTOMY     COLONOSCOPY WITH PROPOFOL N/A 03/31/2018   Procedure: COLONOSCOPY WITH PROPOFOL;  Surgeon: Lucilla Lame, MD;  Location: [facility] ENDOSCOPY;  Service: Endoscopy;  Laterality: N/A;   KNEE ARTHROPLASTY Right 04/30/2019   Procedure: COMPUTER ASSISTED TOTAL KNEE ARTHROPLASTY;  Surgeon: Dereck Leep, MD;  Location: [facility] ORS;  Service: Orthopedics;  Laterality: Right;  RIGHT HEART CATH N/A 01/30/2022   Procedure: RIGHT HEART CATH;  Surgeon: Nelva Bush, MD;  Location: [facility];  Service: Cardiovascular;  Laterality: N/A;   saliva gland removal  1998   TOTAL KNEE ARTHROPLASTY Left 2014   TUBAL LIGATION Bilateral     Social History   Tobacco Use   Smoking status: Never   Smokeless tobacco: Never  Vaping Use   Vaping Use: Never used  Substance Use Topics   Alcohol use: No    Alcohol/week: 0.0 standard drinks of alcohol   Drug use: No     Medication list has been reviewed and updated.  Current Meds  Medication Sig   acetaminophen (TYLENOL) 500 MG tablet Take 1,000 mg by mouth every 6 (six) hours as needed.   azithromycin (ZITHROMAX) 250 MG tablet Take 250 mg by mouth as  directed.   budesonide (PULMICORT) 0.5 MG/2ML nebulizer solution TAKE 2 ML (0.5 MG TOTAL) BY NEBULIZATION TWICE A DAY   diltiazem (CARDIZEM CD) 240 MG 24 hr capsule Take 1 capsule (240 mg total) by mouth daily.   ipratropium-albuterol (DUONEB) 0.5-2.5 (3) MG/3ML SOLN TAKE 3 MLS BY NEBULIZATION EVERY 6 (SIX) HOURS AS NEEDED. J45.40   JARDIANCE 10 MG TABS tablet TAKE 1 TABLET BY MOUTH DAILY BEFORE BREAKFAST.   metolazone (ZAROXOLYN) 2.5 MG tablet Take 1 tablet (2.5 mg total) by mouth 2 (two) times a week.   NON FORMULARY Bipap - nightly with Oxygen 2 Lpm   OXYGEN Inhale into the lungs. 2 LPM [place]   pantoprazole (PROTONIX) 40 MG tablet TAKE 1 TABLET BY MOUTH EVERY DAY   Potassium Chloride ER 20 MEQ TBCR Take 20 mEq by mouth 2 (two) times a week. On M & F with metolazone   PREVALITE 4 g packet Take 1 packet by mouth at bedtime.   rOPINIRole (REQUIP) 1 MG tablet Take 1 tablet (1 mg total) by mouth 3 (three) times daily.   torsemide (DEMADEX) 20 MG tablet Take 2 tablets (40 mg total) by mouth daily.       08/27/2021    2:10 PM 08/06/2021    1:14 PM 02/19/2021   10:02 AM 10/05/2020   11:10 AM  GAD 7 : Generalized Anxiety Score  Nervous, Anxious, on Edge 0 0 0 0  Control/stop worrying 0 0 0 0  Worry too much - different things  0 0 0  Trouble relaxing 0 0 0 0  Restless 0 0 0 0  Easily annoyed or irritable 0 0 0 0  Afraid - awful might happen 0 0 0 0  Total GAD 7 Score  0 0 0  Anxiety Difficulty Not difficult at all Not difficult at all         08/27/2021    2:10 PM 08/08/2021    2:53 PM 02/19/2021   10:02 AM  Depression screen PHQ 2/9  Decreased Interest 1 1 0  Down, Depressed, Hopeless 1 1 0  PHQ - 2 Score 2 2 0  Altered sleeping 2 3 0  Tired, decreased energy 2 3 0  Change in appetite 0 0 0  Feeling bad or failure about yourself  0 0 0  Trouble concentrating 0 0 0  Moving slowly or fidgety/restless 0 0 0  Suicidal thoughts 0 0 0  PHQ-9 Score 6 8 0  Difficult doing work/chores Not  difficult at all Not difficult at all     BP Readings from Last 3 Encounters:  02/13/22 130/70  02/01/22 Marland Kitchen)  112/41  01/23/22 120/60    Physical Exam Vitals and nursing note reviewed.  Constitutional:      General: She is not in acute distress.    Appearance: She is well-developed. She is obese.  HENT:     Head: Normocephalic and atraumatic.  Cardiovascular:     Rate and Rhythm: Normal rate and regular rhythm.     Pulses: Normal pulses.     Heart sounds: No murmur heard. Pulmonary:     Effort: Pulmonary effort is normal. No respiratory distress.     Breath sounds: Decreased breath sounds present. No wheezing or rhonchi.  Musculoskeletal:     Cervical back: Normal range of motion.     Right lower leg: No edema.     Left lower leg: No edema.  Lymphadenopathy:     Cervical: No cervical adenopathy.  Skin:    General: Skin is warm and dry.     Findings: No rash.  Neurological:     Mental Status: She is alert and oriented to person, place, and time.  Psychiatric:        Attention and Perception: Attention normal.        Mood and Affect: Mood normal.        Behavior: Behavior normal.     Wt Readings from Last 3 Encounters:  02/13/22 (!) 332 lb (150.6 kg)  02/01/22 (!) 331 lb 4.8 oz (150.3 kg)  01/23/22 (!) 327 lb 9.6 oz (148.6 kg)    BP 130/70   Pulse 96   Ht _0  (1.626 m)   Wt (!) 332 lb (150.6 kg)   SpO2 92%   BMI 56.99 kg/m   Assessment and Plan: 1. Acute on chronic heart failure with preserved ejection fraction (HFpEF) (HCC) Weight is stable on current regimen Continue home monitoring and follow up with Cardiology  2. CKD (chronic kidney disease) stage 4, GFR 15-29 ml/min (HCC) Followed by Nephrology - appointment next week Will repeat BMP post discharge and advise - Basic metabolic panel  3. Aortic atherosclerosis ([place]) She has never taken statin due to normal lipids May need to reconsider to achieve a lower LDL  4. Restless leg  syndrome Responding well to nightly medications  - rOPINIRole (REQUIP) 1 MG tablet; Take 1 tablet (1 mg total) by mouth 3 (three) times daily.  Dispense: 180 tablet; Refill: 2   Partially dictated using Editor, commissioning. Any errors are unintentional.  Halina Maidens, MD [facility]  02/13/2022

## 2022-02-14 LAB — BASIC METABOLIC PANEL
BUN/Creatinine Ratio: 20 (ref 12–28)
BUN: 46 mg/dL — ABNORMAL HIGH (ref 8–27)
CO2: 25 mmol/L (ref 20–29)
Calcium: 10.1 mg/dL (ref 8.7–10.3)
Chloride: 92 mmol/L — ABNORMAL LOW (ref 96–106)
Creatinine, Ser: 2.28 mg/dL — ABNORMAL HIGH (ref 0.57–1.00)
Glucose: 107 mg/dL — ABNORMAL HIGH (ref 70–99)
Potassium: 4.7 mmol/L (ref 3.5–5.2)
Sodium: 135 mmol/L (ref 134–144)
eGFR: 23 mL/min/{1.73_m2} — ABNORMAL LOW (ref >59)

## 2022-02-21 ENCOUNTER — Encounter: Payer: Medicare Other | Admitting: Internal Medicine

## 2022-02-25 ENCOUNTER — Ambulatory Visit (INDEPENDENT_AMBULATORY_CARE_PROVIDER_SITE_OTHER): Payer: Medicare Other | Admitting: Internal Medicine

## 2022-02-25 ENCOUNTER — Ambulatory Visit: Payer: Self-pay

## 2022-02-25 ENCOUNTER — Encounter: Payer: Self-pay | Admitting: Internal Medicine

## 2022-02-25 VITALS — BP 138/56 | HR 106 | Temp 98.5°F | Ht 64.0 in | Wt 333.7 lb

## 2022-02-25 DIAGNOSIS — J0100 Acute maxillary sinusitis, unspecified: Secondary | ICD-10-CM

## 2022-02-25 MED ORDER — DOXYCYCLINE HYCLATE 100 MG PO TABS: 100.0000 mg | ORAL_TABLET | Freq: Two times a day (BID) | ORAL | 0 refills | Status: AC

## 2022-02-25 NOTE — Patient Instructions (Signed)
Coricidin HBP - over the counter for sinus congestion

## 2022-02-25 NOTE — Progress Notes (Signed)
Date:  02/25/2022   Name:  Samantha Mason   DOB:  Mar 21, 1951   MRN:  [id number]   Chief Complaint: Sinusitis  Sinusitis This is a new problem. The current episode started yesterday. The problem has been waxing and waning since onset. There has been no fever. Associated symptoms include congestion, coughing, shortness of breath, sinus pressure, sneezing and a sore throat. Pertinent negatives include no chills, diaphoresis or ear pain.    Lab Results  Component Value Date   NA 135 02/13/2022   K 4.7 02/13/2022   CO2 25 02/13/2022   GLUCOSE 107 (H) 02/13/2022   BUN 46 (H) 02/13/2022   CREATININE 2.28 (H) 02/13/2022   CALCIUM 10.1 02/13/2022   EGFR 23 (L) 02/13/2022   GFRNONAA 24 (L) 02/01/2022   Lab Results  Component Value Date   CHOL 160 07/16/2021   HDL 81 07/16/2021   LDLCALC 68 07/16/2021   TRIG 57 07/16/2021   CHOLHDL 2.0 07/16/2021   Lab Results  Component Value Date   TSH 0.488 09/20/2020   Lab Results  Component Value Date   HGBA1C 5.4 07/15/2021   Lab Results  Component Value Date   WBC 9.9 01/29/2022   HGB 9.4 (L) 01/29/2022   HCT 30.2 (L) 01/29/2022   MCV 79.7 (L) 01/29/2022   PLT 288 01/29/2022   Lab Results  Component Value Date   ALT 10 03/05/2021   AST 16 03/05/2021   ALKPHOS 71 03/05/2021   BILITOT 0.6 03/05/2021   Lab Results  Component Value Date   VD25OH 24.39 (L) 03/10/2021     Review of Systems  Constitutional:  Positive for fatigue. Negative for chills, diaphoresis and fever.  HENT:  Positive for congestion, sinus pressure, sneezing and sore throat. Negative for ear pain and trouble swallowing.   Respiratory:  Positive for cough and shortness of breath. Negative for wheezing.   Cardiovascular:  Positive for leg swelling (not new). Negative for chest pain.  Psychiatric/Behavioral:  Negative for dysphoric mood and sleep disturbance. The patient is not nervous/anxious.     Patient Active Problem List   Diagnosis Date Noted    Hypokalemia 01/26/2022   SOB (shortness of breath)    Extreme obesity with alveolar hypoventilation (HCC)    CKD (chronic kidney disease) stage 4, GFR 15-29 ml/min (HCC) 01/23/2022   Chronic respiratory failure with hypoxia and hypercapnia ([place]) 10/15/2021   Acute on chronic heart failure with preserved ejection fraction (HFpEF) ([place]) 08/27/2021   Hyponatremia 07/26/2021   Insomnia 07/22/2021   Acute on chronic respiratory failure with hypoxia and hypercapnia ([place]) 07/15/2021   Chronic asthmatic bronchitis 07/15/2021   Morbid obesity ([place]) 07/15/2021   Myocardial injury 07/15/2021   Acute on chronic diastolic congestive heart failure ([place])    History of stroke 11/17/2020   Ovarian failure 02/21/2020   Primary osteoarthritis of right knee 02/13/2019   S/P total knee arthroplasty, left 02/13/2019   Aortic atherosclerosis ([place]) 08/01/2018   Obstructive lung disease (generalized) ([place]) 07/24/2018   B12 deficiency anemia 07/07/2018   Benign neoplasm of ascending colon    Severe persistent asthma 02/13/2018   Vitamin D deficiency 03/17/2017   Prediabetes 03/14/2017   Microcytic anemia 03/14/2017   Irritable bowel syndrome with diarrhea 03/12/2017   Allergic rhinitis 03/12/2017   Restless leg syndrome 03/12/2017   GERD (gastroesophageal reflux disease) 10/16/2016   Essential hypertension 10/16/2016   Morbid obesity with BMI of 50.0-59.9, adult ([place]) 12/08/2014   OSA  12/08/2014  Shortness of breath 04/07/2014    Allergies  Allergen Reactions   Nuvigil [Armodafinil] Hives   Penicillins Diarrhea and Nausea And Vomiting    Did it involve swelling of the face/tongue/throat, SOB, or low BP? no Did it involve sudden or severe rash/hives, skin peeling, or any reaction on the inside of your mouth or nose? No Did you need to seek medical attention at a hospital or doctor's office? No When did it last happen?  in her 56s    If all above answers are "NO", may proceed with cephalosporin  use.    Spironolactone Itching   Gabapentin Other (See Comments)    "wiped her out, couldn't stay awake"   Melatonin Other (See Comments)    "Brain fog"   Provigil [Modafinil] Hives   Entresto [Sacubitril-Valsartan] Itching   Lisinopril Cough    Past Surgical History:  Procedure Laterality Date   CHOLECYSTECTOMY     COLONOSCOPY WITH PROPOFOL N/A 03/31/2018   Procedure: COLONOSCOPY WITH PROPOFOL;  Surgeon: Lucilla Lame, MD;  Location: [facility] ENDOSCOPY;  Service: Endoscopy;  Laterality: N/A;   KNEE ARTHROPLASTY Right 04/30/2019   Procedure: COMPUTER ASSISTED TOTAL KNEE ARTHROPLASTY;  Surgeon: Dereck Leep, MD;  Location: [facility] ORS;  Service: Orthopedics;  Laterality: Right;   RIGHT HEART CATH N/A 01/30/2022   Procedure: RIGHT HEART CATH;  Surgeon: Nelva Bush, MD;  Location: [place] [facility];  Service: Cardiovascular;  Laterality: N/A;   saliva gland removal  1998   TOTAL KNEE ARTHROPLASTY Left 2014   TUBAL LIGATION Bilateral     Social History   Tobacco Use   Smoking status: Never   Smokeless tobacco: Never  Vaping Use   Vaping Use: Never used  Substance Use Topics   Alcohol use: No    Alcohol/week: 0.0 standard drinks of alcohol   Drug use: No     Medication list has been reviewed and updated.  Current Meds  Medication Sig   acetaminophen (TYLENOL) 500 MG tablet Take 1,000 mg by mouth every 6 (six) hours as needed.   budesonide (PULMICORT) 0.5 MG/2ML nebulizer solution TAKE 2 ML (0.5 MG TOTAL) BY NEBULIZATION TWICE A DAY   diltiazem (CARDIZEM CD) 240 MG 24 hr capsule Take 1 capsule (240 mg total) by mouth daily.   ipratropium-albuterol (DUONEB) 0.5-2.5 (3) MG/3ML SOLN TAKE 3 MLS BY NEBULIZATION EVERY 6 (SIX) HOURS AS NEEDED. J45.40   JARDIANCE 10 MG TABS tablet TAKE 1 TABLET BY MOUTH DAILY BEFORE BREAKFAST.   metolazone (ZAROXOLYN) 2.5 MG tablet Take 1 tablet (2.5 mg total) by mouth 2 (two) times a week.   NON FORMULARY Bipap - nightly with Oxygen 2 Lpm    OXYGEN Inhale into the lungs. 2 LPM [place]   pantoprazole (PROTONIX) 40 MG tablet TAKE 1 TABLET BY MOUTH EVERY DAY   Potassium Chloride ER 20 MEQ TBCR Take 20 mEq by mouth 2 (two) times a week. On M & F with metolazone   PREVALITE 4 g packet Take 1 packet by mouth at bedtime.   promethazine (PHENERGAN) 25 MG tablet TAKE 1 TABLET BY MOUTH EVERY 6 HOURS AS NEEDED FOR NAUSEA OR VOMITING.   rOPINIRole (REQUIP) 1 MG tablet Take 1 tablet (1 mg total) by mouth 3 (three) times daily.   torsemide (DEMADEX) 20 MG tablet Take 2 tablets (40 mg total) by mouth daily.       02/25/2022    2:33 PM 02/13/2022    2:32 PM 08/27/2021    2:10 PM 08/06/2021  1:14 PM  GAD 7 : Generalized Anxiety Score  Nervous, Anxious, on Edge 0 0 0 0  Control/stop worrying 0 0 0 0  Worry too much - different things 0 0  0  Trouble relaxing 0 0 0 0  Restless 0 0 0 0  Easily annoyed or irritable 0 0 0 0  Afraid - awful might happen 0 0 0 0  Total GAD 7 Score 0 0  0  Anxiety Difficulty Not difficult at all Not difficult at all Not difficult at all Not difficult at all       02/25/2022    2:32 PM 02/13/2022    2:32 PM 08/27/2021    2:10 PM  Depression screen PHQ 2/9  Decreased Interest 0 0 1  Down, Depressed, Hopeless 0 0 1  PHQ - 2 Score 0 0 2  Altered sleeping 3 0 2  Tired, decreased energy $RemoveBeforeDE'3 3 2  'AUAfTpBrvkQoTmG$ Change in appetite 1 0 0  Feeling bad or failure about yourself  1 0 0  Trouble concentrating 0 0 0  Moving slowly or fidgety/restless 0 0 0  Suicidal thoughts 0 0 0  PHQ-9 Score $RemoveBef'8 3 6  'yeGKCsXYig$ Difficult doing work/chores Somewhat difficult Not difficult at all Not difficult at all    BP Readings from Last 3 Encounters:  02/25/22 (!) 138/56  02/13/22 130/70  02/01/22 (!) 112/41    Physical Exam Constitutional:      Appearance: Normal appearance. She is obese.  Cardiovascular:     Rate and Rhythm: Normal rate and regular rhythm.     Pulses: Normal pulses.  Pulmonary:     Effort: Pulmonary effort is normal.     Breath  sounds: Decreased breath sounds present. No wheezing or rhonchi.  Musculoskeletal:     Cervical back: Normal range of motion.  Lymphadenopathy:     Cervical: No cervical adenopathy.  Neurological:     General: No focal deficit present.     Mental Status: She is alert and oriented to person, place, and time.     Wt Readings from Last 3 Encounters:  02/25/22 (!) 333 lb 11.2 oz (151.4 kg)  02/13/22 (!) 332 lb (150.6 kg)  02/01/22 (!) 331 lb 4.8 oz (150.3 kg)    BP (!) 138/56 (BP Location: Right Arm, Cuff Size: Large)   Pulse (!) 106   Temp 98.5 F (36.9 C) (Oral)   Ht $R'5\' 4"'Qe$  (1.626 m)   Wt (!) 333 lb 11.2 oz (151.4 kg)   SpO2 96%   BMI 57.28 kg/m   Assessment and Plan: 1. Acute non-recurrent maxillary sinusitis Try using Flonase spray Continue O2, duonebs, Pulmicort - doxycycline (VIBRA-TABS) 100 MG tablet; Take 1 tablet (100 mg total) by mouth 2 (two) times daily for 10 days.  Dispense: 20 tablet; Refill: 0   Partially dictated using Editor, commissioning. Any errors are unintentional.  Halina Maidens, MD [facility]  02/25/2022

## 2022-02-25 NOTE — Telephone Encounter (Signed)
  Chief Complaint: sinus congestion Symptoms: cough, congestion, nasal drainage, SOB Frequency: yesterday Pertinent Negatives: Patient denies fever Disposition: '[]'$ ED /'[]'$ Urgent Care (no appt availability in office) / '[x]'$ Appointment(In office/virtual)/ '[]'$  [place] [facility]/ '[]'$ Home Care/ '[]'$ Refused Recommended Disposition /'[]'$ [place] [facility]/ '[]'$  Follow-up with PCP Additional Notes: pt requesting Zpak, advised she would need appt, scheduled VV at 1400 today with PCP.   Reason for Disposition  Lots of coughing  Answer Assessment - Initial Assessment Questions 2. ONSET: "When did the sinus pain start?"  (e.g., hours, days)      Yesterday  5. NASAL CONGESTION: "Is the nose blocked?" If Yes, ask: "Can you open it or must you breathe through your mouth?"     yes 6. NASAL DISCHARGE: "Do you have discharge from your nose?" If so ask, "What color?"      7. FEVER: "Do you have a fever?" If Yes, ask: "What is it, how was it measured, and when did it start?"      no 8. OTHER SYMPTOMS: "Do you have any other symptoms?" (e.g., sore throat, cough, earache, difficulty breathing)     Sore throat, cough, SOB  Protocols used: Sinus Pain or Congestion-A-AH

## 2022-03-04 ENCOUNTER — Other Ambulatory Visit: Payer: Self-pay | Admitting: Internal Medicine

## 2022-03-04 ENCOUNTER — Ambulatory Visit: Payer: Self-pay

## 2022-03-04 ENCOUNTER — Other Ambulatory Visit: Payer: Self-pay | Admitting: Pulmonary Disease

## 2022-03-04 NOTE — Telephone Encounter (Addendum)
Message from [place] sent at 03/04/2022 10:55 AM EDT  Summary: diarrhea from medication   Patient states stopped taking the doxycycline (VIBRA-TABS) 100 MG tablet, because it gave her diarrhea after taking it for 5 days, so she is asking for another alternative        Third attempt to reach pt. LM on VM to call back. Advised pt in VM that will send to office.

## 2022-03-04 NOTE — Telephone Encounter (Signed)
Message from [place] sent at 03/04/2022 10:55 AM EDT  Summary: diarrhea from medication   Patient states stopped taking the doxycycline (VIBRA-TABS) 100 MG tablet, because it gave her diarrhea after taking it for 5 days, so she is asking for another alternative         Called pt and LM on VM to call back to discuss sx.

## 2022-03-04 NOTE — Telephone Encounter (Signed)
Message from [place] sent at 03/04/2022 10:55 AM EDT  Summary: diarrhea from medication   Patient states stopped taking the doxycycline (VIBRA-TABS) 100 MG tablet, because it gave her diarrhea after taking it for 5 days, so she is asking for another alternative         Called pt and LM on VM to call back to discuss diarrhea.

## 2022-03-05 NOTE — Telephone Encounter (Signed)
Requested medication (s) are due for refill today:yes  Requested medication (s) are on the active medication list: yes    Last refill: 02/13/22  #30  0 refills  Future visit scheduled yes 06/10/22  Notes to clinic:Not delegated, please review. Thank you.  Requested Prescriptions  Pending Prescriptions Disp Refills   promethazine (PHENERGAN) 25 MG tablet [Pharmacy Med Name: PROMETHAZINE 25 MG TABLET] 30 tablet 0    Sig: TAKE 1 TABLET BY MOUTH EVERY 6 HOURS AS NEEDED FOR NAUSEA OR VOMITING.     Not Delegated - Gastroenterology: Antiemetics Failed - 03/04/2022  7:04 AM      Failed - This refill cannot be delegated      Passed - Valid encounter within last 6 months    Recent Outpatient Visits           1 week ago Acute non-recurrent maxillary sinusitis   [place] [facility] at [facility], Jesse Sans, MD   2 weeks ago Acute on chronic heart failure with preserved ejection fraction (HFpEF) [facility])   [place] [facility] at [facility], Jesse Sans, MD   6 months ago Chronic heart failure with preserved ejection fraction (HFpEF) [facility])   [place] [facility] at [facility], Jesse Sans, MD   1 year ago Essential hypertension   [place] [facility] at [facility], Jesse Sans, MD   1 year ago Asthma in adult, moderate persistent, with acute exacerbation   [place] [facility] at [facility], Jesse Sans, MD       Future Appointments             In 1 week Kathlen Mody, Cadence H, PA-C [place]. [place]   In 3 months Army Melia, Jesse Sans, MD [facility] Primary Care and Sports Medicine at [facility], [facility]            Signed Prescriptions Disp Refills   PREVALITE 4 g packet 90 packet 3    Sig: DISSOLVE AND DRINK 1 PACKET BY MOUTH AT BEDTIME     Cardiovascular:  Antilipid  - Bile Acid Sequestrants Failed - 03/04/2022  7:04 AM      Failed - Lipid Panel in normal range within the last 12 months    Cholesterol  Date Value Ref Range Status  07/16/2021 160 0 - 200 mg/dL Final   LDL Cholesterol  Date Value Ref Range Status  07/16/2021 68 0 - 99 mg/dL Final    Comment:           Total Cholesterol/HDL:CHD Risk Coronary Heart Disease Risk Table                     Men   Women  1/2 Average Risk   3.4   3.3  Average Risk       5.0   4.4  2 X Average Risk   9.6   7.1  3 X Average Risk  23.4   11.0        Use the calculated Patient Ratio above and the CHD Risk Table to determine the patient's CHD Risk.        ATP III CLASSIFICATION (LDL):  <100     mg/dL   Optimal  100-129  mg/dL   Near or Above  Optimal  130-159  mg/dL   Borderline  160-189  mg/dL   High  >190     mg/dL   Very High Performed at [facility], [place], [place] [address]    HDL  Date Value Ref Range Status  07/16/2021 81 >40 mg/dL Final   Triglycerides  Date Value Ref Range Status  07/16/2021 57 <150 mg/dL Final         Passed - Valid encounter within last 12 months    Recent Outpatient Visits           1 week ago Acute non-recurrent maxillary sinusitis   [place] [facility] at [facility], Jesse Sans, MD   2 weeks ago Acute on chronic heart failure with preserved ejection fraction (HFpEF) [facility])   [place] [facility] at [facility], Jesse Sans, MD   6 months ago Chronic heart failure with preserved ejection fraction (HFpEF) [facility])   [place] [facility] at [facility], Jesse Sans, MD   1 year ago Essential hypertension   [place] [facility] at [facility], Jesse Sans, MD   1 year ago Asthma in adult, moderate persistent, with acute exacerbation   [place] [facility] and [facility] Medicine at  [facility], Jesse Sans, MD       Future Appointments             In 1 week Kathlen Mody, Cadence H, PA-C [place]. [place]   In 3 months Army Melia, Jesse Sans, MD [place] [facility] at [facility], [facility]

## 2022-03-05 NOTE — Telephone Encounter (Signed)
Requested Prescriptions  Pending Prescriptions Disp Refills  . promethazine (PHENERGAN) 25 MG tablet [Pharmacy Med Name: PROMETHAZINE 25 MG TABLET] 30 tablet 0    Sig: TAKE 1 TABLET BY MOUTH EVERY 6 HOURS AS NEEDED FOR NAUSEA OR VOMITING.     Not Delegated - Gastroenterology: Antiemetics Failed - 03/04/2022  7:04 AM      Failed - This refill cannot be delegated      Passed - Valid encounter within last 6 months    Recent Outpatient Visits          1 week ago Acute non-recurrent maxillary sinusitis   [place] [facility] at [facility], Jesse Sans, MD   2 weeks ago Acute on chronic heart failure with preserved ejection fraction (HFpEF) [facility])   [place] [facility] at [facility], Jesse Sans, MD   6 months ago Chronic heart failure with preserved ejection fraction (HFpEF) [facility])   [place] [facility] at [facility], Jesse Sans, MD   1 year ago Essential hypertension   [place] [facility] at [facility], Jesse Sans, MD   1 year ago Asthma in adult, moderate persistent, with acute exacerbation   [place] [facility] at [facility], Jesse Sans, MD      Future Appointments            In 1 week Kathlen Mody, Cadence H, PA-C [place]. [place]   In 3 months Army Melia, Jesse Sans, MD [facility] Primary Care and Sports Medicine at [facility], [facility]           . PREVALITE 4 g packet [Pharmacy Med Name: PREVALITE PACKET] 90 packet 3    Sig: DISSOLVE AND DRINK 1 PACKET BY MOUTH AT BEDTIME     Cardiovascular:  Antilipid - Bile Acid Sequestrants Failed - 03/04/2022  7:04 AM      Failed - Lipid Panel in normal range within the last 12 months    Cholesterol  Date Value Ref Range Status  07/16/2021 160 0 - 200 mg/dL Final   LDL Cholesterol  Date Value Ref Range Status   07/16/2021 68 0 - 99 mg/dL Final    Comment:           Total Cholesterol/HDL:CHD Risk Coronary Heart Disease Risk Table                     Men   Women  1/2 Average Risk   3.4   3.3  Average Risk       5.0   4.4  2 X Average Risk   9.6   7.1  3 X Average Risk  23.4   11.0        Use the calculated Patient Ratio above and the CHD Risk Table to determine the patient's CHD Risk.        ATP III CLASSIFICATION (LDL):  <100     mg/dL   Optimal  100-129  mg/dL   Near or Above                    Optimal  130-159  mg/dL   Borderline  160-189  mg/dL   High  >190     mg/dL   Very High Performed at [facility], [address] [place], [place] [address]  HDL  Date Value Ref Range Status  07/16/2021 81 >40 mg/dL Final   Triglycerides  Date Value Ref Range Status  07/16/2021 57 <150 mg/dL Final         Passed - Valid encounter within last 12 months    Recent Outpatient Visits          1 week ago Acute non-recurrent maxillary sinusitis   [place] [facility] at [facility], Jesse Sans, MD   2 weeks ago Acute on chronic heart failure with preserved ejection fraction (HFpEF) [facility])   [place] [facility] at [facility], Jesse Sans, MD   6 months ago Chronic heart failure with preserved ejection fraction (HFpEF) [facility])   [place] [facility] at [facility], Jesse Sans, MD   1 year ago Essential hypertension   [place] [facility] at [facility], Jesse Sans, MD   1 year ago Asthma in adult, moderate persistent, with acute exacerbation   [place] [facility] at [facility], Jesse Sans, MD      Future Appointments            In 1 week Kathlen Mody, Cadence H, PA-C [place]. [place]   In 3 months Army Melia, Jesse Sans, MD [place] [facility]  Medicine at [facility], [facility]

## 2022-03-06 ENCOUNTER — Other Ambulatory Visit: Payer: Self-pay | Admitting: Pulmonary Disease

## 2022-03-07 ENCOUNTER — Other Ambulatory Visit: Payer: Self-pay | Admitting: Family

## 2022-03-07 MED ORDER — DAPAGLIFLOZIN PROPANEDIOL 10 MG PO TABS: 10.0000 mg | ORAL_TABLET | Freq: Every day | ORAL | 0 refills | Status: DC

## 2022-03-07 NOTE — Progress Notes (Signed)
D/cd jardiance and added farxiga '10mg'$  daily per [place] provider (Dr Bonney Leitz)

## 2022-03-08 NOTE — Telephone Encounter (Signed)
Patient is requesting refill on Tessalon '100mg'$ . This medication was not previously ordered by our office.  Dr. Patsey Berthold, please advise. Thanks

## 2022-03-15 ENCOUNTER — Telehealth: Payer: Self-pay | Admitting: Internal Medicine

## 2022-03-15 ENCOUNTER — Ambulatory Visit: Payer: Medicare Other | Admitting: Medical

## 2022-03-15 DIAGNOSIS — I5032 Chronic diastolic (congestive) heart failure: Secondary | ICD-10-CM

## 2022-03-15 DIAGNOSIS — N183 Chronic kidney disease, stage 3 unspecified: Secondary | ICD-10-CM

## 2022-03-15 NOTE — Telephone Encounter (Signed)
Spoke with patient and she reports feeling better than she was this morning. She reports taking extra torsemide, metolazone, and potassium. She states that she has went to the bathroom frequently so the medication is helping her. Patient states she is still having a hard time breathing but she had a sinus infection was on antibiotics which she had to stop due to intolerance and cough. She mentioned possible flu and inquired if she had been tested for COVID. She states that she has not been out of the house so she has not been tested. Advised that I would forward this message over to provider for review and would be in touch if he had any recommendations. She verbalized  understanding with no further questions at this time.

## 2022-03-15 NOTE — Telephone Encounter (Signed)
Pt c/o swelling: STAT is pt has developed SOB within 24 hours  If swelling, where is the swelling located?  Hands and feet  How much weight have you gained and in what time span? 4 lbs since last night  Have you gained 3 pounds in a day or 5 pounds in a week? 4 lbs over night  Do you have a log of your daily weights (if so, list)?   Are you currently taking a fluid pill? yes  Are you currently SOB? yes  Have you traveled recently? no

## 2022-03-18 NOTE — Telephone Encounter (Signed)
Patient called to report she hasn't lost any weight, she only has lost like 2 ozs. She states she took her metolazone 2.'5mg'$   and she took 2 tablets for torsemide '20mg'$ , she has only gone to the bathroom one time and it wasn't much. She states her ankles and feet are really swollen.

## 2022-03-18 NOTE — Telephone Encounter (Signed)
Left voice message for pt to call back.

## 2022-03-18 NOTE — Telephone Encounter (Signed)
Spoke with pt who states that she has had a 5 pound weight gain since Friday. Pt does not have any vital signs recorded as her monitor is broken. Pt states that her insurance wanted dialysis to take care of this but that her nephrologist is requesting input from cardiologist. Will route to [place] as he is the DOD.

## 2022-03-19 MED ORDER — TORSEMIDE 20 MG PO TABS: 40.0000 mg | ORAL_TABLET | Freq: Two times a day (BID) | ORAL | Status: DC

## 2022-03-19 NOTE — Telephone Encounter (Signed)
Most recent labs I see are from 02/19/2022 through her nephrologist.  I recommend that she continue with the medication changes recommended by Dr. Rockey Situ and have a BMP done on Friday.  Nelva Bush, MD [facility]

## 2022-03-19 NOTE — Telephone Encounter (Signed)
Spoke with patient and reviewed recommendations to have repeat labs done on Friday. She verbalized understanding with no further questions at this time.

## 2022-03-19 NOTE — Addendum Note (Signed)
Addended by: Valora Corporal on: 03/19/2022 04:57 PM   Modules accepted: Orders

## 2022-03-19 NOTE — Addendum Note (Signed)
Addended by: Valora Corporal on: 03/19/2022 02:40 PM   Modules accepted: Orders

## 2022-03-19 NOTE — Telephone Encounter (Signed)
Spoke with patient and reviewed provider recommendations. She reports today she is feeling much better, weight is down 4 pounds, and her legs are not as tight. Offered her appointment for tomorrow but she declined. She would like to make the medication changes for now and then see if she would need to be seen. She had labs done recently and would like to see how those are as well. Advised that with those medication changes we may need to repeat those labs and that I would check with her provider and will be in touch if he should like to have those done. She verbalized understanding of our conversation with no further questions at this time.      End, Harrell Gave, MD  You; Rockey Situ Kathlene November, MD 3 hours ago (10:53 AM)    Thanks for the update, Tim.  I agree with your plan.  I can see her tomorrow as the DOD if needed.   Collene Mares, Kathlene November, MD  End, Harrell Gave, MD 4 hours ago (10:08 AM)    Juluis Rainier  I received a phone call on her for DOD  Tried to make adjustments to her diuretic   Thx  TG     Bensimhon, Shaune Pascal, MD  Minna Merritts, MD; [facility] Triage 6 hours ago (7:46 AM)    Sounds like a plan.   Happy to see her as well as needed.      Minna Merritts, MD  [facility] Triage; Bensimhon, Shaune Pascal, MD 19 hours ago (6:41 PM)    Given 5 pound weight gain,  Would recommend metolazone up to 5 mg in the morning, 30 minutes later take torsemide 60 mg  Repeat dose torsemide 40 mg after lunch  Would try this for 3 days, then down to torsemide 40 twice daily  If no improvement in symptoms may need to use subcu Lasix   Appears she may be established with [facility] team,  Will CC Dr. Linna Hoff,  In the setting of underlying renal failure, high risk of readmission  Thx  Alford Highland

## 2022-03-22 ENCOUNTER — Telehealth: Payer: Self-pay | Admitting: Pulmonary Disease

## 2022-03-22 NOTE — Telephone Encounter (Signed)
Patient of Dr. Zoila Shutter. She needs to use the Pulmicort as prescribed. This should help with her symptoms.

## 2022-03-22 NOTE — Telephone Encounter (Signed)
Called and spoke to patient.  She stated that PCP prescribed doxycycline 3-4 weeks ago. She was unable to complete course due to side effects.  C/o increased SOB with exertion, chest tightness, wheezing and dry cough. Denied f/c/s or additional sx.  Using Duoneb solution QID. She has not been using Pulmicort, as she felt that she needed the duoneb more.  No current covid test.    Dr. Patsey Berthold, please advise. Thanks

## 2022-03-22 NOTE — Telephone Encounter (Signed)
Patient is aware of recommendations and voiced her understanding. Nothing further needed.   

## 2022-03-29 ENCOUNTER — Other Ambulatory Visit: Payer: Self-pay | Admitting: Internal Medicine

## 2022-03-29 NOTE — Telephone Encounter (Signed)
Requested medication (s) are due for refill today: yes  Requested medication (s) are on the active medication list: yes  Last refill:  03/05/22 03/05/23 #30 0 refills  Future visit scheduled: yes in 2 months   Notes to clinic:  not delegated per protocol. Do you want to refill Rx?     Requested Prescriptions  Pending Prescriptions Disp Refills   promethazine (PHENERGAN) 25 MG tablet [Pharmacy Med Name: PROMETHAZINE 25 MG TABLET] 30 tablet 0    Sig: TAKE 1 TABLET BY MOUTH EVERY 6 HOURS AS NEEDED FOR NAUSEA OR VOMITING.     Not Delegated - Gastroenterology: Antiemetics Failed - 03/29/2022  4:53 PM      Failed - This refill cannot be delegated      Passed - Valid encounter within last 6 months    Recent Outpatient Visits           1 month ago Acute non-recurrent maxillary sinusitis   [place] [facility] at [facility], Jesse Sans, MD   1 month ago Acute on chronic heart failure with preserved ejection fraction (HFpEF) [facility])   [place] [facility] at [facility], Jesse Sans, MD   7 months ago Chronic heart failure with preserved ejection fraction (HFpEF) [facility])   [place] [facility] at [facility], Jesse Sans, MD   1 year ago Essential hypertension   [place] [facility] at [facility], Jesse Sans, MD   1 year ago Asthma in adult, moderate persistent, with acute exacerbation   [place] [facility] at [facility], Jesse Sans, MD       Future Appointments             In 2 months Army Melia, Jesse Sans, MD [facility] at [facility], [facility]

## 2022-04-26 ENCOUNTER — Other Ambulatory Visit: Payer: Self-pay

## 2022-04-26 ENCOUNTER — Inpatient Hospital Stay
Admission: EM | Admit: 2022-04-26 | Discharge: 2022-05-14 | DRG: 388 | Disposition: A | Discharge status: Home Health | Payer: Medicare Other | Attending: Internal Medicine | Admitting: Internal Medicine

## 2022-04-26 ENCOUNTER — Emergency Department: Payer: Medicare Other

## 2022-04-26 DIAGNOSIS — Z515 Encounter for palliative care: Secondary | ICD-10-CM

## 2022-04-26 DIAGNOSIS — G4733 Obstructive sleep apnea (adult) (pediatric): Secondary | ICD-10-CM | POA: Diagnosis present

## 2022-04-26 DIAGNOSIS — J9612 Chronic respiratory failure with hypercapnia: Secondary | ICD-10-CM | POA: Diagnosis present

## 2022-04-26 DIAGNOSIS — I5032 Chronic diastolic (congestive) heart failure: Secondary | ICD-10-CM | POA: Diagnosis present

## 2022-04-26 DIAGNOSIS — Z96652 Presence of left artificial knee joint: Secondary | ICD-10-CM | POA: Diagnosis present

## 2022-04-26 DIAGNOSIS — I10 Essential (primary) hypertension: Secondary | ICD-10-CM | POA: Diagnosis present

## 2022-04-26 DIAGNOSIS — E860 Dehydration: Secondary | ICD-10-CM | POA: Diagnosis present

## 2022-04-26 DIAGNOSIS — Z96653 Presence of artificial knee joint, bilateral: Secondary | ICD-10-CM | POA: Diagnosis present

## 2022-04-26 DIAGNOSIS — J4489 Other specified chronic obstructive pulmonary disease: Secondary | ICD-10-CM | POA: Diagnosis present

## 2022-04-26 DIAGNOSIS — K565 Intestinal adhesions [bands], unspecified as to partial versus complete obstruction: Secondary | ICD-10-CM | POA: Diagnosis present

## 2022-04-26 DIAGNOSIS — Z9981 Dependence on supplemental oxygen: Secondary | ICD-10-CM

## 2022-04-26 DIAGNOSIS — Z8249 Family history of ischemic heart disease and other diseases of the circulatory system: Secondary | ICD-10-CM

## 2022-04-26 DIAGNOSIS — Z6841 Body Mass Index (BMI) 40.0 and over, adult: Secondary | ICD-10-CM | POA: Diagnosis not present

## 2022-04-26 DIAGNOSIS — Z20822 Contact with and (suspected) exposure to covid-19: Secondary | ICD-10-CM | POA: Diagnosis present

## 2022-04-26 DIAGNOSIS — G9341 Metabolic encephalopathy: Secondary | ICD-10-CM | POA: Diagnosis not present

## 2022-04-26 DIAGNOSIS — J9611 Chronic respiratory failure with hypoxia: Secondary | ICD-10-CM | POA: Diagnosis present

## 2022-04-26 DIAGNOSIS — J449 Chronic obstructive pulmonary disease, unspecified: Secondary | ICD-10-CM | POA: Diagnosis present

## 2022-04-26 DIAGNOSIS — I130 Hypertensive heart and chronic kidney disease with heart failure and stage 1 through stage 4 chronic kidney disease, or unspecified chronic kidney disease: Secondary | ICD-10-CM | POA: Diagnosis present

## 2022-04-26 DIAGNOSIS — Z880 Allergy status to penicillin: Secondary | ICD-10-CM

## 2022-04-26 DIAGNOSIS — G2581 Restless legs syndrome: Secondary | ICD-10-CM | POA: Diagnosis present

## 2022-04-26 DIAGNOSIS — Z7951 Long term (current) use of inhaled steroids: Secondary | ICD-10-CM

## 2022-04-26 DIAGNOSIS — R4182 Altered mental status, unspecified: Secondary | ICD-10-CM | POA: Diagnosis not present

## 2022-04-26 DIAGNOSIS — K589 Irritable bowel syndrome without diarrhea: Secondary | ICD-10-CM | POA: Diagnosis present

## 2022-04-26 DIAGNOSIS — Z825 Family history of asthma and other chronic lower respiratory diseases: Secondary | ICD-10-CM

## 2022-04-26 DIAGNOSIS — Z9851 Tubal ligation status: Secondary | ICD-10-CM

## 2022-04-26 DIAGNOSIS — N170 Acute kidney failure with tubular necrosis: Secondary | ICD-10-CM | POA: Diagnosis present

## 2022-04-26 DIAGNOSIS — N184 Chronic kidney disease, stage 4 (severe): Secondary | ICD-10-CM | POA: Diagnosis present

## 2022-04-26 DIAGNOSIS — Z9049 Acquired absence of other specified parts of digestive tract: Secondary | ICD-10-CM

## 2022-04-26 DIAGNOSIS — Z5320 Procedure and treatment not carried out because of patient's decision for unspecified reasons: Secondary | ICD-10-CM | POA: Diagnosis not present

## 2022-04-26 DIAGNOSIS — R3129 Other microscopic hematuria: Secondary | ICD-10-CM | POA: Diagnosis not present

## 2022-04-26 DIAGNOSIS — Z8673 Personal history of transient ischemic attack (TIA), and cerebral infarction without residual deficits: Secondary | ICD-10-CM

## 2022-04-26 DIAGNOSIS — Z7189 Other specified counseling: Secondary | ICD-10-CM | POA: Diagnosis not present

## 2022-04-26 DIAGNOSIS — D631 Anemia in chronic kidney disease: Secondary | ICD-10-CM | POA: Diagnosis present

## 2022-04-26 DIAGNOSIS — K56609 Unspecified intestinal obstruction, unspecified as to partial versus complete obstruction: Principal | ICD-10-CM | POA: Diagnosis present

## 2022-04-26 DIAGNOSIS — Z823 Family history of stroke: Secondary | ICD-10-CM

## 2022-04-26 DIAGNOSIS — E871 Hypo-osmolality and hyponatremia: Secondary | ICD-10-CM | POA: Diagnosis present

## 2022-04-26 DIAGNOSIS — N2581 Secondary hyperparathyroidism of renal origin: Secondary | ICD-10-CM | POA: Diagnosis present

## 2022-04-26 DIAGNOSIS — Z888 Allergy status to other drugs, medicaments and biological substances status: Secondary | ICD-10-CM

## 2022-04-26 LAB — URINALYSIS, ROUTINE W REFLEX MICROSCOPIC
Bilirubin Urine: NEGATIVE
Glucose, UA: NEGATIVE mg/dL
Hgb urine dipstick: NEGATIVE
Ketones, ur: NEGATIVE mg/dL
Leukocytes,Ua: NEGATIVE
Nitrite: NEGATIVE
Protein, ur: NEGATIVE mg/dL
Specific Gravity, Urine: 1.009 (ref 1.005–1.030)
pH: 5 (ref 5.0–8.0)

## 2022-04-26 LAB — COMPREHENSIVE METABOLIC PANEL
ALT: 17 U/L (ref 0–44)
AST: 19 U/L (ref 15–41)
Albumin: 3.6 g/dL (ref 3.5–5.0)
Alkaline Phosphatase: 105 U/L (ref 38–126)
Anion gap: 11 (ref 5–15)
BUN: 36 mg/dL — ABNORMAL HIGH (ref 8–23)
CO2: 31 mmol/L (ref 22–32)
Calcium: 9.6 mg/dL (ref 8.9–10.3)
Chloride: 88 mmol/L — ABNORMAL LOW (ref 98–111)
Creatinine, Ser: 2.29 mg/dL — ABNORMAL HIGH (ref 0.44–1.00)
GFR, Estimated: 22 mL/min — ABNORMAL LOW (ref >60)
Glucose, Bld: 132 mg/dL — ABNORMAL HIGH (ref 70–99)
Potassium: 4.2 mmol/L (ref 3.5–5.1)
Sodium: 130 mmol/L — ABNORMAL LOW (ref 135–145)
Total Bilirubin: 1 mg/dL (ref 0.3–1.2)
Total Protein: 8.4 g/dL — ABNORMAL HIGH (ref 6.5–8.1)

## 2022-04-26 LAB — CBC
HCT: 37.3 % (ref 36.0–46.0)
HCT: 38.2 % (ref 36.0–46.0)
Hemoglobin: 11.5 g/dL — ABNORMAL LOW (ref 12.0–15.0)
Hemoglobin: 11.9 g/dL — ABNORMAL LOW (ref 12.0–15.0)
MCH: 24.5 pg — ABNORMAL LOW (ref 26.0–34.0)
MCH: 24.6 pg — ABNORMAL LOW (ref 26.0–34.0)
MCHC: 30.8 g/dL (ref 30.0–36.0)
MCHC: 31.2 g/dL (ref 30.0–36.0)
MCV: 79.4 fL — ABNORMAL LOW (ref 80.0–100.0)
MCV: 78.9 fL — ABNORMAL LOW (ref 80.0–100.0)
Platelets: 349 10*3/uL (ref 150–400)
Platelets: 393 10*3/uL (ref 150–400)
RBC: 4.7 MIL/uL (ref 3.87–5.11)
RBC: 4.84 MIL/uL (ref 3.87–5.11)
RDW: 16.2 % — ABNORMAL HIGH (ref 11.5–15.5)
RDW: 16.3 % — ABNORMAL HIGH (ref 11.5–15.5)
WBC: 15.2 10*3/uL — ABNORMAL HIGH (ref 4.0–10.5)
WBC: 17.2 10*3/uL — ABNORMAL HIGH (ref 4.0–10.5)
nRBC: 0 % (ref 0.0–0.2)
nRBC: 0 % (ref 0.0–0.2)

## 2022-04-26 LAB — CREATININE, SERUM
Creatinine, Ser: 2.56 mg/dL — ABNORMAL HIGH (ref 0.44–1.00)
GFR, Estimated: 20 mL/min — ABNORMAL LOW (ref >60)

## 2022-04-26 LAB — LIPASE, BLOOD: Lipase: 31 U/L (ref 11–51)

## 2022-04-26 MED ORDER — ACETAMINOPHEN 650 MG RE SUPP
650.0000 mg | Freq: Four times a day (QID) | RECTAL | Status: DC | PRN
  Administered 2022-04-30: 650 mg via RECTAL
  Filled 2022-04-26: qty 1

## 2022-04-26 MED ORDER — METOCLOPRAMIDE HCL 5 MG/ML IJ SOLN
10.0000 mg | Freq: Once | INTRAMUSCULAR | Status: AC
  Administered 2022-04-26: 10 mg via INTRAVENOUS
  Filled 2022-04-26: qty 2

## 2022-04-26 MED ORDER — ENOXAPARIN SODIUM 80 MG/0.8ML IJ SOSY
0.5000 mg/kg | PREFILLED_SYRINGE | INTRAMUSCULAR | Status: DC
  Administered 2022-04-27: 75 mg via SUBCUTANEOUS
  Filled 2022-04-26 (×2): qty 0.75

## 2022-04-26 MED ORDER — ONDANSETRON HCL 4 MG PO TABS
4.0000 mg | ORAL_TABLET | Freq: Four times a day (QID) | ORAL | Status: DC | PRN
  Administered 2022-05-04 – 2022-05-11 (×3): 4 mg via ORAL
  Filled 2022-04-26 (×4): qty 1

## 2022-04-26 MED ORDER — MORPHINE SULFATE (PF) 2 MG/ML IV SOLN
2.0000 mg | INTRAVENOUS | Status: DC | PRN
  Administered 2022-04-27 – 2022-05-11 (×53): 2 mg via INTRAVENOUS
  Filled 2022-04-26 (×55): qty 1

## 2022-04-26 MED ORDER — ONDANSETRON HCL 4 MG/2ML IJ SOLN
4.0000 mg | Freq: Four times a day (QID) | INTRAMUSCULAR | Status: DC | PRN
  Administered 2022-04-27 – 2022-05-13 (×35): 4 mg via INTRAVENOUS
  Filled 2022-04-26 (×38): qty 2

## 2022-04-26 MED ORDER — LACTATED RINGERS IV SOLN: INTRAVENOUS | Status: DC

## 2022-04-26 MED ORDER — MORPHINE SULFATE (PF) 4 MG/ML IV SOLN
4.0000 mg | Freq: Once | INTRAVENOUS | Status: AC
  Administered 2022-04-26: 4 mg via INTRAVENOUS
  Filled 2022-04-26: qty 1

## 2022-04-26 MED ORDER — DIATRIZOATE MEGLUMINE & SODIUM 66-10 % PO SOLN
90.0000 mL | Freq: Once | ORAL | Status: AC
  Administered 2022-04-27: 90 mL via NASOGASTRIC

## 2022-04-26 MED ORDER — FAMOTIDINE IN NACL 20-0.9 MG/50ML-% IV SOLN
20.0000 mg | Freq: Once | INTRAVENOUS | Status: AC
  Administered 2022-04-26: 20 mg via INTRAVENOUS
  Filled 2022-04-26: qty 50

## 2022-04-26 MED ORDER — BUDESONIDE 0.5 MG/2ML IN SUSP
0.5000 mg | Freq: Two times a day (BID) | RESPIRATORY_TRACT | Status: DC
  Administered 2022-04-26 – 2022-05-14 (×36): 0.5 mg via RESPIRATORY_TRACT
  Filled 2022-04-26 (×37): qty 2

## 2022-04-26 MED ORDER — HYDRALAZINE HCL 20 MG/ML IJ SOLN: 5.0000 mg | INTRAMUSCULAR | Status: DC | PRN

## 2022-04-26 MED ORDER — ACETAMINOPHEN 325 MG PO TABS
650.0000 mg | ORAL_TABLET | Freq: Four times a day (QID) | ORAL | Status: DC | PRN
  Administered 2022-05-13: 650 mg via ORAL
  Filled 2022-04-26 (×2): qty 2

## 2022-04-26 MED ORDER — IPRATROPIUM-ALBUTEROL 0.5-2.5 (3) MG/3ML IN SOLN
3.0000 mL | Freq: Four times a day (QID) | RESPIRATORY_TRACT | Status: DC | PRN
  Administered 2022-04-26 – 2022-04-27 (×2): 3 mL via RESPIRATORY_TRACT
  Filled 2022-04-26 (×2): qty 3

## 2022-04-26 NOTE — ED Notes (Signed)
Pt refused NG tube, MD notified.

## 2022-04-26 NOTE — ED Provider Notes (Signed)
[facility] Provider Note    Event Date/Time   First MD Initiated Contact with Patient 04/26/22 1734     (approximate)   History   Abdominal Pain   HPI  Samantha Mason is a 71 y.o. female with a history of morbid obesity, OSA on CPAP, COPD on 2 L O2, diastolic CHF, hypertension, and CKD who presents with epigastric pain for 3 days, course, associated with nausea and vomiting today.  She denies any diarrhea, fever, chest pain, or increased respiratory symptoms.  I reviewed the past medical records.  Per the hospitalist discharge summary from 9/15 the patient was admitted at that time for acute on chronic respiratory failure thought to be due to volume overload and was treated with diuresis.  She has not had any ED visits since then.   Physical Exam   Triage Vital Signs: ED Triage Vitals  Enc Vitals Group     BP 04/26/22 1328 (!) 160/83     Pulse Rate 04/26/22 1328 (!) 103     Resp 04/26/22 1328 19     Temp 04/26/22 1328 98.4 F (36.9 C)     Temp Source 04/26/22 1328 Oral     SpO2 04/26/22 1328 97 %     Weight --      Height --      Head Circumference --      Peak Flow --      Pain Score 04/26/22 1327 10     Pain Loc --      Pain Edu? --      Excl. in [place]? --     Most recent vital signs: Vitals:   04/26/22 1328 04/26/22 1647  BP: (!) 160/83 (!) 154/68  Pulse: (!) 103 91  Resp: 19 18  Temp: 98.4 F (36.9 C) 98.2 F (36.8 C)  SpO2: 97% 90%     General: Alert and oriented, no acute distress. CV:  Good peripheral perfusion.  Resp:  Normal effort.  Abd:  Mild epigastric tenderness.  No distention.  Other:  No jaundice or scleral icterus.  Moist mucous membranes.   ED Results / Procedures / Treatments   Labs (all labs ordered are listed, but only abnormal results are displayed) Labs Reviewed  COMPREHENSIVE METABOLIC PANEL - Abnormal; Notable for the following components:      Result Value   Sodium 130 (*)    Chloride 88 (*)     Glucose, Bld 132 (*)    BUN 36 (*)    Creatinine, Ser 2.29 (*)    Total Protein 8.4 (*)    GFR, Estimated 22 (*)    All other components within normal limits  CBC - Abnormal; Notable for the following components:   WBC 15.2 (*)    Hemoglobin 11.5 (*)    MCV 79.4 (*)    MCH 24.5 (*)    RDW 16.2 (*)    All other components within normal limits  LIPASE, BLOOD  URINALYSIS, ROUTINE W REFLEX MICROSCOPIC     EKG     RADIOLOGY  CT abdomen/pelvis: I independently viewed and interpreted the images; there are dilated bowel loops in the upper abdomen.  Radiology report indicates small bowel obstruction with transition point in the ileum.   PROCEDURES:  Critical Care performed: No  Procedures   MEDICATIONS ORDERED IN ED: Medications  diatrizoate meglumine-sodium (GASTROGRAFIN) 66-10 % solution 90 mL (has no administration in time range)  metoCLOPramide (REGLAN) injection 10 mg (10 mg Intravenous Given  04/26/22 1825)  morphine (PF) 4 MG/ML injection 4 mg (4 mg Intravenous Given 04/26/22 1830)  famotidine (PEPCID) IVPB 20 mg premix (20 mg Intravenous New Bag/Given 04/26/22 1835)     IMPRESSION / MDM / ASSESSMENT AND PLAN / ED COURSE  I reviewed the triage vital signs and the nursing notes.  71 year old female with PMH as noted above presents with epigastric abdominal pain for the last several days associate with nausea vomiting today.  Physical exam reveals mild tenderness to the upper abdomen and states it feels like her gastritis.  Differential diagnosis includes, but is not limited to, gastric, gastroparesis, gastroenteritis, pancreatitis, SBO, volvulus.  Will obtain CT abdomen/pelvis.  Initial lab workup reveals leukocytosis.  Lipase is normal.  LFTs are normal.  Creatinine is elevated but this is baseline for the patient.  I have ordered IV Reglan, morphine, and Pepcid since the patient is not tolerating p.o.  Patient's presentation is most consistent with acute presentation  with potential threat to life or bodily function.  ----------------------------------------- 7:26 PM on 04/26/2022 -----------------------------------------  CT shows evidence of a small bowel obstruction.  The patient is continued having some vomiting.  I ordered an NG tube.  I consulted Dr. Lysle Alahna from general surgery who evaluated the patient.  I then consulted Dr. Damita Dunnings from the hospitalist service; based on discussion she agrees to admit the patient.    FINAL CLINICAL IMPRESSION(S) / ED DIAGNOSES   Final diagnoses:  Small bowel obstruction ([place])     Rx / DC Orders   ED Discharge Orders     None        Note:  This document was prepared using Dragon voice recognition software and may include unintentional dictation errors.    Arta Silence, MD 04/26/22 1942

## 2022-04-26 NOTE — ED Triage Notes (Signed)
Pt comes with c/o 3 days of vomiting and belly pain. Pt states constant pain. Pt states no diarrhea.   Pt does wears 2L as needed for about the last 2 months.

## 2022-04-26 NOTE — Assessment & Plan Note (Addendum)
Blood pressure mildly elevated Hydralazine IV as needed for elevated blood pressure  -Home antihypertensives are being currently held due to AKI and SBO

## 2022-04-26 NOTE — Assessment & Plan Note (Addendum)
Clinically euvolemic to dry Monitor for fluid overload in view of IV hydration with as needed IV Lasix Daily weights

## 2022-04-26 NOTE — Assessment & Plan Note (Signed)
O2 requirement at baseline Continue supplemental oxygen

## 2022-04-26 NOTE — Assessment & Plan Note (Signed)
Not acutely exacerbated Continue Pulmicort twice daily with DuoNebs as needed

## 2022-04-26 NOTE — Assessment & Plan Note (Signed)
CT showing small bowel obstruction with transition zone in the ileum Patient repeatedly refused NG tube placement ordered by ED after an initial unsuccessful attempt NG tube being placed in the ED IV hydration, antiemetics and pain control Surgical consult to follow

## 2022-04-26 NOTE — Assessment & Plan Note (Addendum)
Estimated body mass index is 61.31 kg/m as calculated from the following:   Height as of this encounter: 5\' 1"  (1.549 m).   Weight as of this encounter: 147.2 kg.   -Complicating factor to overall prognosis and care

## 2022-04-26 NOTE — Assessment & Plan Note (Signed)
CPAP nightly

## 2022-04-26 NOTE — H&P (Signed)
History and Physical    Patient: Samantha Mason EXB:[id number] DOB: 07-24-1950 DOA: 04/26/2022 DOS: the patient was seen and examined on 04/26/2022 PCP: Glean Hess, MD  Patient coming from: Home  Chief Complaint:  Chief Complaint  Patient presents with   Abdominal Pain    HPI: Samantha Mason is a 71 y.o. female with medical history significant for diastolic CHF, and COPD with chronic respiratory failure on 2 L nasal cannula as well as obstructive sleep apnea on CPAP, stage IIIb chronic kidney disease, hypertension and morbid obesity who presents to the ED with a 3-day history of crampy upper abdominal pain and a 1 day history of nonbloody nonbilious vomiting.  She denies fever or chills, diarrhea or dysuria.  Denies chest pain or shortness of breath. ED course and data review: BP 160/83 with pulse of 103 and otherwise normal vitals.  Labs with WBC 15,200.  Sodium 130, creatinine2.29, up from baseline of 1.96.  Lipase and LFTs within normal limits.  CT abdomen and pelvis showing SBO as described below: IMPRESSION: 1. Small-bowel obstruction with transition zone in the ileum. Specific etiology is not identified. 2. Aortic atherosclerosis.  Patient treated with morphine Reglan and famotidine.  The ED provider: Spoke with surgeon, Dr. Lysle Brunella who will see patient.  Plan for conservative management.   Hospitalist consulted for admission.  Patient repeatedly refused NG tube placement ordered by ED after an initial unsuccessful attempt  Review of Systems: As mentioned in the history of present illness. All other systems reviewed and are negative.  Past Medical History:  Diagnosis Date   Acute bronchitis    Anemia    Arthritis    Asthma    B12 deficiency 03/14/2017   Chest pain 07/02/2018   Chest pain    a. 06/2018 MV: EF 66%. No ischemia/infarct; b. 02/2021 PET CT: EF>60%, no ischemia/infarct.   Chronic heart failure with preserved ejection fraction (HFpEF) ([place])    a. 08/2020  Echo: EF 55-60%, GrI DD; b. 06/2021 Echo: EF 55-60%, mild LVH, nl RV fxn, no signif valvular dzs.   COPD (chronic obstructive pulmonary disease) ([place]) 07/24/2018   Family history of blood clots 07/16/2017   Irritable bowel syndrome    Malignant hypertension    Obesity    OSA (obstructive sleep apnea)    uses cpap   Pneumonia 2017   Restless leg syndrome    Stroke [facility]) 1995   mild   Past Surgical History:  Procedure Laterality Date   CHOLECYSTECTOMY     COLONOSCOPY WITH PROPOFOL N/A 03/31/2018   Procedure: COLONOSCOPY WITH PROPOFOL;  Surgeon: Lucilla Lame, MD;  Location: [facility];  Service: Endoscopy;  Laterality: N/A;   KNEE ARTHROPLASTY Right 04/30/2019   Procedure: COMPUTER ASSISTED TOTAL KNEE ARTHROPLASTY;  Surgeon: Dereck Leep, MD;  Location: [facility] ORS;  Service: Orthopedics;  Laterality: Right;   RIGHT HEART CATH N/A 01/30/2022   Procedure: RIGHT HEART CATH;  Surgeon: Nelva Bush, MD;  Location: [facility];  Service: Cardiovascular;  Laterality: N/A;   saliva gland removal  1998   TOTAL KNEE ARTHROPLASTY Left 2014   TUBAL LIGATION Bilateral    Social History:  reports that she has never smoked. She has never used smokeless tobacco. She reports that she does not drink alcohol and does not use drugs.  Allergies  Allergen Reactions   Nuvigil [Armodafinil] Hives   Penicillins Diarrhea and Nausea And Vomiting    Did it involve swelling of the face/tongue/throat, SOB, or low  BP? no Did it involve sudden or severe rash/hives, skin peeling, or any reaction on the inside of your mouth or nose? No Did you need to seek medical attention at a hospital or doctor's office? No When did it last happen?  in her 97s    If all above answers are "NO", may proceed with cephalosporin use.    Spironolactone Itching   Gabapentin Other (See Comments)    "wiped her out, couldn't stay awake"   Melatonin Other (See Comments)    "Brain fog"   Provigil [Modafinil] Hives    Entresto [Sacubitril-Valsartan] Itching   Lisinopril Cough    Family History  Problem Relation Age of Onset   Dementia Mother    COPD Father        80   Diabetes Father    Heart failure Brother    Coronary artery disease Brother 56       CABG   Heart failure Maternal Grandmother    Stroke Maternal Grandmother    Breast cancer Neg Hx     Prior to Admission medications   Medication Sig Start Date End Date Taking? Authorizing Provider  acetaminophen (TYLENOL) 500 MG tablet Take 1,000 mg by mouth every 6 (six) hours as needed.    [provider]  benzonatate (TESSALON) 100 MG capsule TAKE 1 CAPSULE BY MOUTH THREE TIMES A DAY AS NEEDED FOR COUGH 03/08/22   Tyler Pita, MD  budesonide (PULMICORT) 0.5 MG/2ML nebulizer solution TAKE 2 ML (0.5 MG TOTAL) BY NEBULIZATION TWICE A DAY 10/29/21   Tyler Pita, MD  dapagliflozin propanediol (FARXIGA) 10 MG TABS tablet Take 1 tablet (10 mg total) by mouth daily before breakfast. 03/07/22   Alisa Graff, FNP  diltiazem (CARDIZEM CD) 240 MG 24 hr capsule Take 1 capsule (240 mg total) by mouth daily. 09/05/21   Theora Gianotti, NP  ipratropium-albuterol (DUONEB) 0.5-2.5 (3) MG/3ML SOLN TAKE 3 MLS BY NEBULIZATION EVERY 6 (SIX) HOURS AS NEEDED. J45.40 03/04/22   Tyler Pita, MD  metolazone (ZAROXOLYN) 2.5 MG tablet Take 1 tablet (2.5 mg total) by mouth 2 (two) times a week. 02/04/22   Wouk, Ailene Rud, MD  NON FORMULARY Bipap - nightly with Oxygen 2 Lpm    [provider]  OXYGEN Inhale into the lungs. 2 LPM [place]    [provider]  pantoprazole (PROTONIX) 40 MG tablet TAKE 1 TABLET BY MOUTH EVERY DAY 12/11/21   Glean Hess, MD  Potassium Chloride ER 20 MEQ TBCR Take 20 mEq by mouth 2 (two) times a week. On M & F with metolazone 02/07/22   Alisa Graff, FNP  PREVALITE 4 g packet DISSOLVE AND DRINK 1 PACKET BY MOUTH AT BEDTIME 03/05/22   Glean Hess, MD  promethazine (PHENERGAN) 25 MG  tablet TAKE 1 TABLET BY MOUTH EVERY 6 HOURS AS NEEDED FOR NAUSEA OR VOMITING. 04/01/22 04/01/23  Glean Hess, MD  rOPINIRole (REQUIP) 1 MG tablet Take 1 tablet (1 mg total) by mouth 3 (three) times daily. 02/13/22   Glean Hess, MD  torsemide (DEMADEX) 20 MG tablet Take 2 tablets (40 mg total) by mouth 2 (two) times daily. 03/19/22   Minna Merritts, MD    Physical Exam: Vitals:   04/26/22 1328 04/26/22 1647  BP: (!) 160/83 (!) 154/68  Pulse: (!) 103 91  Resp: 19 18  Temp: 98.4 F (36.9 C) 98.2 F (36.8 C)  TempSrc: Oral Oral  SpO2: 97% 90%  Physical Exam Vitals and nursing note reviewed.  Constitutional:      General: She is not in acute distress.    Appearance: She is morbidly obese.     Comments: Patient seen sitting up in bed in no distress  HENT:     Head: Normocephalic and atraumatic.  Cardiovascular:     Rate and Rhythm: Normal rate and regular rhythm.     Heart sounds: Normal heart sounds.  Pulmonary:     Effort: Pulmonary effort is normal.     Breath sounds: Normal breath sounds.  Abdominal:     Palpations: Abdomen is soft.     Tenderness: There is abdominal tenderness in the periumbilical area.  Neurological:     Mental Status: Mental status is at baseline.     Labs on Admission: I have personally reviewed following labs and imaging studies  CBC: Recent Labs  Lab 04/26/22 1328  WBC 15.2*  HGB 11.5*  HCT 37.3  MCV 79.4*  PLT 829   Basic Metabolic Panel: Recent Labs  Lab 04/26/22 1328  NA 130*  K 4.2  CL 88*  CO2 31  GLUCOSE 132*  BUN 36*  CREATININE 2.29*  CALCIUM 9.6   GFR: CrCl cannot be calculated (Unknown ideal weight.). Liver Function Tests: Recent Labs  Lab 04/26/22 1328  AST 19  ALT 17  ALKPHOS 105  BILITOT 1.0  PROT 8.4*  ALBUMIN 3.6   Recent Labs  Lab 04/26/22 1328  LIPASE 31   No results for input(s): "AMMONIA" in the last 168 hours. Coagulation Profile: No results for input(s): "INR", "PROTIME" in  the last 168 hours. Cardiac Enzymes: No results for input(s): "CKTOTAL", "CKMB", "CKMBINDEX", "TROPONINI" in the last 168 hours. BNP (last 3 results) No results for input(s): "PROBNP" in the last 8760 hours. HbA1C: No results for input(s): "HGBA1C" in the last 72 hours. CBG: No results for input(s): "GLUCAP" in the last 168 hours. Lipid Profile: No results for input(s): "CHOL", "HDL", "LDLCALC", "TRIG", "CHOLHDL", "LDLDIRECT" in the last 72 hours. Thyroid Function Tests: No results for input(s): "TSH", "T4TOTAL", "FREET4", "T3FREE", "THYROIDAB" in the last 72 hours. Anemia Panel: No results for input(s): "VITAMINB12", "FOLATE", "FERRITIN", "TIBC", "IRON", "RETICCTPCT" in the last 72 hours. Urine analysis:    Component Value Date/Time   COLORURINE YELLOW (A) 07/17/2021 0945   APPEARANCEUR CLEAR (A) 07/17/2021 0945   APPEARANCEUR Clear 04/27/2012 0958   LABSPEC 1.011 07/17/2021 0945   LABSPEC 1.014 04/27/2012 0958   PHURINE 5.0 07/17/2021 0945   GLUCOSEU NEGATIVE 07/17/2021 0945   GLUCOSEU Negative 04/27/2012 0958   HGBUR NEGATIVE 07/17/2021 0945   BILIRUBINUR NEGATIVE 07/17/2021 0945   BILIRUBINUR neg 02/19/2021 1040   BILIRUBINUR Negative 04/27/2012 0958   KETONESUR NEGATIVE 07/17/2021 0945   PROTEINUR NEGATIVE 07/17/2021 0945   UROBILINOGEN 0.2 02/19/2021 1040   NITRITE NEGATIVE 07/17/2021 0945   LEUKOCYTESUR NEGATIVE 07/17/2021 0945   LEUKOCYTESUR Negative 04/27/2012 0958    Radiological Exams on Admission: CT ABDOMEN PELVIS WO CONTRAST  Result Date: 04/26/2022 CLINICAL DATA:  Abdominal pain, acute, nonlocalized. EXAM: CT ABDOMEN AND PELVIS WITHOUT CONTRAST TECHNIQUE: Multidetector CT imaging of the abdomen and pelvis was performed following the standard protocol without IV contrast. RADIATION DOSE REDUCTION: This exam was performed according to the departmental dose-optimization program which includes automated exposure control, adjustment of the mA and/or kV according to  patient size and/or use of iterative reconstruction technique. COMPARISON:  Ultrasound 07/24/2021.  CT 08/30/2010 FINDINGS: Lower chest: Mild atelectasis or scarring at the right  lung base. Hepatobiliary: Liver parenchyma appears normal without contrast. Previous cholecystectomy. No sign of ductal dilatation given that. Pancreas: Normal Spleen: Normal Adrenals/Urinary Tract: Adrenal glands are normal. Kidneys are normal. Bladder is normal. Stomach/Bowel: Stomach is fluid-filled. Numerous dilated fluid and air-filled loops of small intestine with transition zone in the ileum consistent with small bowel obstruction. Specific etiology is not identified. No abnormal colon finding. Vascular/Lymphatic: Aortic atherosclerosis, mild. No aneurysm. IVC is normal. No adenopathy. Reproductive: No adnexal mass.  Small uterine lipoma. Other: No free fluid or air. Musculoskeletal: Ordinary mild spinal degenerative changes. IMPRESSION: 1. Small-bowel obstruction with transition zone in the ileum. Specific etiology is not identified. 2. Aortic atherosclerosis. Aortic Atherosclerosis (ICD10-I70.0). Electronically Signed   By: Nelson Chimes M.D.   On: 04/26/2022 18:32     Data Reviewed: Relevant notes from primary care and specialist visits, past discharge summaries as available in EHR, including Care Everywhere. Prior diagnostic testing as pertinent to current admission diagnoses Updated medications and problem lists for reconciliation ED course, including vitals, labs, imaging, treatment and response to treatment Triage notes, nursing and pharmacy notes and ED provider's notes Notable results as noted in HPI   Assessment and Plan: * SBO (small bowel obstruction) ([place]) CT showing small bowel obstruction with transition zone in the ileum Patient repeatedly refused NG tube placement ordered by ED after an initial unsuccessful attempt NG tube being placed in the ED IV hydration, antiemetics and pain control Surgical  consult to follow  Chronic diastolic CHF (congestive heart failure) (HCC) Clinically euvolemic to dry Hold oral meds while n.p.o. Monitor for fluid overload in view of IV hydration with as needed IV Lasix Daily weights  Essential hypertension Hydralazine IV as needed for elevated blood pressure while n.p.o. Will hold diltiazem, metolazone and torsemide while n.p.o.  CKD (chronic kidney disease) stage 4, GFR 15-29 ml/min (HCC) Renal function near baseline at 2.29  Chronic respiratory failure with hypoxia and hypercapnia (HCC) O2 requirement at baseline Continue supplemental oxygen  Obstructive lung disease (generalized) ([place]) Not acutely exacerbated Continue Pulmicort twice daily with DuoNebs as needed  OSA  CPAP nightly  Morbid obesity with BMI of 50.0-59.9, adult ([place]) Complicating factor to overall prognosis and care        DVT prophylaxis: Lovenox  Consults: surgery, Dr Lysle Christyna   Advance Care Planning:   Code Status: Prior   Family Communication: none  Disposition Plan: Back to previous home environment  Severity of Illness: The appropriate patient status for this patient is INPATIENT. Inpatient status is judged to be reasonable and necessary in order to provide the required intensity of service to ensure the patient's safety. The patient's presenting symptoms, physical exam findings, and initial radiographic and laboratory data in the context of their chronic comorbidities is felt to place them at high risk for further clinical deterioration. Furthermore, it is not anticipated that the patient will be medically stable for discharge from the hospital within 2 midnights of admission.   * I certify that at the point of admission it is my clinical judgment that the patient will require inpatient hospital care spanning beyond 2 midnights from the point of admission due to high intensity of service, high risk for further deterioration and high frequency of surveillance  required.*  Author: Athena Masse, MD 04/26/2022 7:48 PM  For on call review [email or web address].

## 2022-04-26 NOTE — Assessment & Plan Note (Signed)
Renal function near baseline at 2.29

## 2022-04-27 ENCOUNTER — Encounter: Payer: Self-pay | Admitting: Internal Medicine

## 2022-04-27 ENCOUNTER — Inpatient Hospital Stay: Payer: Medicare Other

## 2022-04-27 DIAGNOSIS — K56609 Unspecified intestinal obstruction, unspecified as to partial versus complete obstruction: Secondary | ICD-10-CM | POA: Diagnosis not present

## 2022-04-27 LAB — BASIC METABOLIC PANEL
Anion gap: 9 (ref 5–15)
BUN: 42 mg/dL — ABNORMAL HIGH (ref 8–23)
CO2: 31 mmol/L (ref 22–32)
Calcium: 9.2 mg/dL (ref 8.9–10.3)
Chloride: 89 mmol/L — ABNORMAL LOW (ref 98–111)
Creatinine, Ser: 3.31 mg/dL — ABNORMAL HIGH (ref 0.44–1.00)
GFR, Estimated: 14 mL/min — ABNORMAL LOW (ref >60)
Glucose, Bld: 134 mg/dL — ABNORMAL HIGH (ref 70–99)
Potassium: 4.8 mmol/L (ref 3.5–5.1)
Sodium: 129 mmol/L — ABNORMAL LOW (ref 135–145)

## 2022-04-27 LAB — PHOSPHORUS: Phosphorus: 4.6 mg/dL (ref 2.5–4.6)

## 2022-04-27 LAB — CBC
HCT: 37.5 % (ref 36.0–46.0)
Hemoglobin: 11.6 g/dL — ABNORMAL LOW (ref 12.0–15.0)
MCH: 24.6 pg — ABNORMAL LOW (ref 26.0–34.0)
MCHC: 30.9 g/dL (ref 30.0–36.0)
MCV: 79.4 fL — ABNORMAL LOW (ref 80.0–100.0)
Platelets: 367 10*3/uL (ref 150–400)
RBC: 4.72 MIL/uL (ref 3.87–5.11)
RDW: 16 % — ABNORMAL HIGH (ref 11.5–15.5)
WBC: 19.3 10*3/uL — ABNORMAL HIGH (ref 4.0–10.5)
nRBC: 0 % (ref 0.0–0.2)

## 2022-04-27 LAB — MAGNESIUM: Magnesium: 1.7 mg/dL (ref 1.7–2.4)

## 2022-04-27 MED ORDER — IPRATROPIUM-ALBUTEROL 0.5-2.5 (3) MG/3ML IN SOLN
3.0000 mL | RESPIRATORY_TRACT | Status: DC
  Administered 2022-04-27 – 2022-04-29 (×10): 3 mL via RESPIRATORY_TRACT
  Filled 2022-04-27 (×9): qty 3

## 2022-04-27 MED ORDER — ENOXAPARIN SODIUM 40 MG/0.4ML IJ SOSY
40.0000 mg | PREFILLED_SYRINGE | INTRAMUSCULAR | Status: DC
  Administered 2022-04-27: 40 mg via SUBCUTANEOUS
  Filled 2022-04-27: qty 0.4

## 2022-04-27 MED ORDER — SODIUM CHLORIDE 0.9 % IV SOLN
12.5000 mg | Freq: Four times a day (QID) | INTRAVENOUS | Status: AC | PRN
  Administered 2022-04-27 (×2): 12.5 mg via INTRAVENOUS
  Filled 2022-04-27 (×2): qty 12.5

## 2022-04-27 NOTE — Progress Notes (Signed)
[facility] Progress Note  Patient: Samantha Mason    XIP:[id number]  DOA: 04/26/2022     Date of Service: the patient was seen and examined on 04/27/2022  Chief Complaint  Patient presents with   Abdominal Pain   Brief hospital course: Samantha Mason is a 71 y.o. female with medical history significant for diastolic CHF, and COPD with chronic respiratory failure on 2 L nasal cannula as well as obstructive sleep apnea on CPAP, stage IIIb chronic kidney disease, hypertension and morbid obesity who presents to the ED with a 3-day history of crampy upper abdominal pain and a 1 day history of nonbloody nonbilious vomiting.  She denies fever or chills, diarrhea or dysuria.  Denies chest pain or shortness of breath. ED course and data review: BP 160/83 with pulse of 103 and otherwise normal vitals.  Labs with WBC 15,200.  Sodium 130, creatinine2.29, up from baseline of 1.96.  Lipase and LFTs within normal limits.  CT abdomen and pelvis showing SBO as described below: IMPRESSION: 1. Small-bowel obstruction with transition zone in the ileum. Specific etiology is not identified. 2. Aortic atherosclerosis.   Patient treated with morphine Reglan and famotidine.  The ED provider: Spoke with surgeon, Dr. Lysle Jazzie who will see patient.  Plan for conservative management.   Hospitalist consulted for admission.  Patient repeatedly refused NG tube placement ordered by ED after an initial unsuccessful attempt   Assessment and Plan: * SBO (small bowel obstruction) ([place]) CT showing small bowel obstruction with transition zone in the ileum Patient repeatedly refused NG tube placement ordered by ED after an initial unsuccessful attempt IV hydration, antiemetics and pain control Surgical consult done, recommended conservative management Follow small bowel follow-through    Chronic diastolic CHF (congestive heart failure) (HCC) Clinically euvolemic to dry Hold oral meds while n.p.o. Monitor for fluid  overload in view of IV hydration with as needed IV Lasix Daily weights   Essential hypertension Hydralazine IV as needed for elevated blood pressure while n.p.o. Will hold diltiazem, metolazone and torsemide while n.p.o.   CKD (chronic kidney disease) stage 4, GFR 15-29 ml/min (HCC) Renal function near baseline at 2.29   Chronic respiratory failure with hypoxia and hypercapnia (HCC) O2 requirement at baseline Continue supplemental oxygen   Obstructive lung disease (generalized) ([place]) Not acutely exacerbated Continue Pulmicort twice daily with DuoNebs as needed   OSA  CPAP nightly   Morbid obesity with BMI of 50.0-59.9, adult ([place]) Complicating factor to overall prognosis and care Body mass index is 61.98 kg/m.  Interventions: Counseling done       Diet: NPO DVT Prophylaxis: Subcutaneous Lovenox   Advance goals of care discussion: Full code  Family Communication: family was NOT present at bedside, at the time of interview.  The pt provided permission to discuss medical plan with the family. Opportunity was given to ask question and all questions were answered satisfactorily.   Disposition:  Pt is from Home, admitted with SBO, still has SBO and NPO IVF , which precludes a safe discharge. Discharge to Home, when in 1-2 days.  Subjective: Patient was admitted overnight due to small bowel obstruction.  Patient has generalized abdominal pain more in the upper belly, feels nauseous but no vomiting.  Denies any chest pain or palpitation, no shortness of breath. Unsuccessful attempt of NG tube, patient is persistently refusing to retry.   Physical Exam: General:  alert oriented to time, place, and person.  Appear in mild distress, affect appropriate Eyes: PERRLA ENT: Oral  Mucosa Clear, moist  Neck: no JVD,  Cardiovascular: S1 and S2 Present, no Murmur,  Respiratory: good respiratory effort, Bilateral Air entry equal and Decreased, no Crackles, no wheezes Abdomen: Bowel  Sound present, Soft and upper Abd tenderness,  Skin: No rashes Extremities: no Pedal edema, no calf tenderness Neurologic: without any new focal findings Gait not checked due to patient safety concerns  Vitals:   04/27/22 0654 04/27/22 0733 04/27/22 0834 04/27/22 1052  BP: (!) 150/80  (!) 155/61 125/68  Pulse: (!) 116  (!) 117 (!) 114  Resp: '20  16 16  '$ Temp: 98.8 F (37.1 C)  98.6 F (37 C) 98.6 F (37 C)  TempSrc: Oral  Oral Oral  SpO2: 96% (!) 89% 90% 96%  Weight:      Height:        Intake/Output Summary (Last 24 hours) at 04/27/2022 1445 Last data filed at 04/27/2022 0144 Gross per 24 hour  Intake 100 ml  Output --  Net 100 ml   Filed Weights   04/26/22 2014 04/27/22 0520  Weight: (!) 148.8 kg (!) 148.8 kg    Data Reviewed: I have personally reviewed and interpreted daily labs, tele strips, imagings as discussed above. I reviewed all nursing notes, pharmacy notes, vitals, pertinent old records I have discussed plan of care as described above with RN and patient/family.  CBC: Recent Labs  Lab 04/26/22 1328 04/26/22 2010 04/27/22 0931  WBC 15.2* 17.2* 19.3*  HGB 11.5* 11.9* 11.6*  HCT 37.3 38.2 37.5  MCV 79.4* 78.9* 79.4*  PLT 349 393 811   Basic Metabolic Panel: Recent Labs  Lab 04/26/22 1328 04/26/22 2010 04/27/22 0931  NA 130*  --  129*  K 4.2  --  4.8  CL 88*  --  89*  CO2 31  --  31  GLUCOSE 132*  --  134*  BUN 36*  --  42*  CREATININE 2.29* 2.56* 3.31*  CALCIUM 9.6  --  9.2  MG  --   --  1.7  PHOS  --   --  4.6    Studies: CT ABDOMEN PELVIS WO CONTRAST  Result Date: 04/26/2022 CLINICAL DATA:  Abdominal pain, acute, nonlocalized. EXAM: CT ABDOMEN AND PELVIS WITHOUT CONTRAST TECHNIQUE: Multidetector CT imaging of the abdomen and pelvis was performed following the standard protocol without IV contrast. RADIATION DOSE REDUCTION: This exam was performed according to the departmental dose-optimization program which includes automated exposure  control, adjustment of the mA and/or kV according to patient size and/or use of iterative reconstruction technique. COMPARISON:  Ultrasound 07/24/2021.  CT 08/30/2010 FINDINGS: Lower chest: Mild atelectasis or scarring at the right lung base. Hepatobiliary: Liver parenchyma appears normal without contrast. Previous cholecystectomy. No sign of ductal dilatation given that. Pancreas: Normal Spleen: Normal Adrenals/Urinary Tract: Adrenal glands are normal. Kidneys are normal. Bladder is normal. Stomach/Bowel: Stomach is fluid-filled. Numerous dilated fluid and air-filled loops of small intestine with transition zone in the ileum consistent with small bowel obstruction. Specific etiology is not identified. No abnormal colon finding. Vascular/Lymphatic: Aortic atherosclerosis, mild. No aneurysm. IVC is normal. No adenopathy. Reproductive: No adnexal mass.  Small uterine lipoma. Other: No free fluid or air. Musculoskeletal: Ordinary mild spinal degenerative changes. IMPRESSION: 1. Small-bowel obstruction with transition zone in the ileum. Specific etiology is not identified. 2. Aortic atherosclerosis. Aortic Atherosclerosis (ICD10-I70.0). Electronically Signed   By: Nelson Chimes M.D.   On: 04/26/2022 18:32    Scheduled Meds:  budesonide  0.5 mg Nebulization BID  enoxaparin (LOVENOX) injection  40 mg Subcutaneous Q24H   Continuous Infusions:  lactated ringers Stopped (04/27/22 0355)   PRN Meds: acetaminophen **OR** acetaminophen, hydrALAZINE, ipratropium-albuterol, morphine injection, ondansetron **OR** ondansetron (ZOFRAN) IV  Time spent: 35 minutes  Author: Val Riles. MD [facility] 04/27/2022 2:45 PM  To reach On-call, see care teams to locate the attending and reach out to them via [email or web address]. If 7PM-7AM, please contact night-coverage If you still have difficulty reaching the attending provider, please page the [facility] (Director on Call) for [facility] on amion for assistance.

## 2022-04-27 NOTE — Progress Notes (Signed)
Patient completed oral contrast. Xray notified.

## 2022-04-27 NOTE — Progress Notes (Signed)
PHARMACIST - PHYSICIAN COMMUNICATION  CONCERNING:  Enoxaparin (Lovenox) for DVT Prophylaxis    RECOMMENDATION: Patient was prescribed enoxaparin '40mg'$  q24 hours for VTE prophylaxis.   Filed Weights   04/26/22 2014 04/27/22 0520  Weight: (!) 148.8 kg (328 lb) (!) 148.8 kg (328 lb)    Body mass index is 61.98 kg/m.  Estimated Creatinine Clearance: 28.5 mL/min (A) (by C-G formula based on SCr of 2.56 mg/dL (H)).  Patient is candidate for enoxaparin 40 mg every 24 hours based on CrCl <39m/min and BMI > 30  DESCRIPTION: Pharmacy has [facility] dose [facility]  Patient is now receiving enoxaparin 40 mg every 24 hours    ABenita Gutter12/01/2022 7:30 AM

## 2022-04-27 NOTE — Progress Notes (Signed)
Rachael Fee NP notified of patients refusal to start IV fluids, acknowledged.

## 2022-04-27 NOTE — Consult Note (Signed)
Subjective:   CC: Small bowel obstruction  HPI:  Samantha Mason is a 71 y.o. female who was consulted by Damita Dunnings for issue above.  Symptoms were first noted 4 days ago. Pain is sharp, generalized abdominal.  Associated with nausea vomiting, exacerbated by nothing specific.  Last bowel movement was day prior to admission   Past Medical History:  has a past medical history of Acute bronchitis, Anemia, Arthritis, Asthma, B12 deficiency (03/14/2017), Chest pain (07/02/2018), Chest pain, Chronic heart failure with preserved ejection fraction (HFpEF) ([place]), COPD (chronic obstructive pulmonary disease) ([place]) (07/24/2018), Family history of blood clots (07/16/2017), Irritable bowel syndrome, Malignant hypertension, Obesity, OSA (obstructive sleep apnea), Pneumonia (2017), Restless leg syndrome, and Stroke ([place]) (1995).  Past Surgical History:  Past Surgical History:  Procedure Laterality Date   CHOLECYSTECTOMY     COLONOSCOPY WITH PROPOFOL N/A 03/31/2018   Procedure: COLONOSCOPY WITH PROPOFOL;  Surgeon: Lucilla Lame, MD;  [facility] ENDOSCOPY;  Service: Endoscopy;  Laterality: N/A;   KNEE ARTHROPLASTY Right 04/30/2019   Procedure: COMPUTER ASSISTED TOTAL KNEE ARTHROPLASTY;  Surgeon: Dereck Leep, MD;  Location: [facility] ORS;  Service: Orthopedics;  Laterality: Right;   RIGHT HEART CATH N/A 01/30/2022   Procedure: RIGHT HEART CATH;  Surgeon: Nelva Bush, MD;  Location: [facility];  Service: Cardiovascular;  Laterality: N/A;   saliva gland removal  1998   TOTAL KNEE ARTHROPLASTY Left 2014   TUBAL LIGATION Bilateral     Family History: family history includes COPD in her father; Coronary artery disease (age of onset: 21) in her brother; Dementia in her mother; Diabetes in her father; Heart failure in her brother and maternal grandmother; Stroke in her maternal grandmother.  Social History:  reports that she has never smoked. She has never used smokeless tobacco. She reports that she  does not drink alcohol and does not use drugs.  Current Medications:  Prior to Admission medications   Medication Sig Start Date End Date Taking? Authorizing Provider  benzonatate (TESSALON) 100 MG capsule TAKE 1 CAPSULE BY MOUTH THREE TIMES A DAY AS NEEDED FOR COUGH 03/08/22  Yes Tyler Pita, MD  budesonide (PULMICORT) 0.5 MG/2ML nebulizer solution TAKE 2 ML (0.5 MG TOTAL) BY NEBULIZATION TWICE A DAY 10/29/21  Yes Tyler Pita, MD  calcitRIOL (ROCALTROL) 0.25 MCG capsule Take 0.25 mcg by mouth daily. 02/22/22  Yes [provider]  diltiazem (CARDIZEM CD) 240 MG 24 hr capsule Take 1 capsule (240 mg total) by mouth daily. 09/05/21  Yes Theora Gianotti, NP  ipratropium-albuterol (DUONEB) 0.5-2.5 (3) MG/3ML SOLN TAKE 3 MLS BY NEBULIZATION EVERY 6 (SIX) HOURS AS NEEDED. J45.40 03/04/22  Yes Tyler Pita, MD  metolazone (ZAROXOLYN) 2.5 MG tablet Take 1 tablet (2.5 mg total) by mouth 2 (two) times a week. 02/04/22  Yes Wouk, Ailene Rud, MD  pantoprazole (PROTONIX) 40 MG tablet TAKE 1 TABLET BY MOUTH EVERY DAY 12/11/21  Yes Glean Hess, MD  Potassium Chloride ER 20 MEQ TBCR Take 20 mEq by mouth 2 (two) times a week. On M & F with metolazone 02/07/22  Yes Alisa Graff, FNP  PREVALITE 4 g packet DISSOLVE AND DRINK 1 PACKET BY MOUTH AT BEDTIME 03/05/22  Yes Glean Hess, MD  rOPINIRole (REQUIP) 1 MG tablet Take 1 tablet (1 mg total) by mouth 3 (three) times daily. 02/13/22  Yes Glean Hess, MD  torsemide (DEMADEX) 20 MG tablet Take 2 tablets (40 mg total) by mouth 2 (two) times daily.  03/19/22  Yes Gollan, Kathlene November, MD  acetaminophen (TYLENOL) 500 MG tablet Take 1,000 mg by mouth every 6 (six) hours as needed.    [provider]  dapagliflozin propanediol (FARXIGA) 10 MG TABS tablet Take 1 tablet (10 mg total) by mouth daily before breakfast. Patient not taking: Reported on 04/27/2022 03/07/22   Alisa Graff, FNP  NON FORMULARY Bipap - nightly  with Oxygen 2 Lpm    [provider]  OXYGEN Inhale into the lungs. 2 LPM [place]    [provider]  promethazine (PHENERGAN) 25 MG tablet TAKE 1 TABLET BY MOUTH EVERY 6 HOURS AS NEEDED FOR NAUSEA OR VOMITING. 04/01/22 04/01/23  Glean Hess, MD    Allergies:  Allergies as of 04/26/2022 - Review Complete 04/26/2022  Allergen Reaction Noted   Nuvigil [armodafinil] Hives 02/15/2019   Penicillins Diarrhea and Nausea And Vomiting 03/29/2014   Spironolactone Itching 01/09/2022   Gabapentin Other (See Comments) 05/07/2019   Melatonin Other (See Comments) 10/08/2021   Provigil [modafinil] Hives 02/10/2020   Entresto [sacubitril-valsartan] Itching 09/03/2021   Lisinopril Cough 08/06/2021    ROS:  General: Denies weight loss, weight gain, fatigue, fevers, chills, and night sweats. Eyes: Denies blurry vision, double vision, eye pain, itchy eyes, and tearing. Ears: Denies hearing loss, earache, and ringing in ears. Nose: Denies sinus pain, congestion, infections, runny nose, and nosebleeds. Mouth/throat: Denies hoarseness, sore throat, bleeding gums, and difficulty swallowing. Heart: Denies chest pain, palpitations, racing heart, irregular heartbeat, leg pain or swelling, and decreased activity tolerance. Respiratory: Denies breathing difficulty, shortness of breath, wheezing, cough, and sputum. GI: Denies change in appetite, heartburn, constipation, diarrhea, and blood in stool. GU: Denies difficulty urinating, pain with urinating, urgency, frequency, blood in urine. Musculoskeletal: Denies joint stiffness, pain, swelling, muscle weakness. Skin: Denies rash, itching, mass, tumors, sores, and boils Neurologic: Denies headache, fainting, dizziness, seizures, numbness, and tingling. Psychiatric: Denies depression, anxiety, difficulty sleeping, and memory loss. Endocrine: Denies heat or cold intolerance, and increased thirst or urination. Blood/lymph: Denies easy bruising, easy  bruising, and swollen glands     Objective:     BP 125/68 (BP Location: Left Arm)   Pulse (!) 114   Temp 98.6 F (37 C) (Oral)   Resp 16   Ht '5\' 1"'$  (1.549 m)   Wt (!) 148.8 kg   SpO2 96%   BMI 61.98 kg/m   Constitutional :  alert, cooperative, appears stated age, and no distress  Lymphatics/Throat:  no asymmetry, masses, or scars  Respiratory:  clear to auscultation bilaterally  Cardiovascular:  regular rate and rhythm  Gastrointestinal: Soft, no guarding, focal epigastric tenderness noted on palpation but otherwise no complaint of pain .   Musculoskeletal: Steady movement  Skin: Cool and moist   Psychiatric: Normal affect, non-agitated, not confused       LABS:     Latest Ref Rng & Units 04/27/2022    9:31 AM 04/26/2022    8:10 PM 04/26/2022    1:28 PM  CMP  Glucose 70 - 99 mg/dL 134   132   BUN 8 - 23 mg/dL 42   36   Creatinine 0.44 - 1.00 mg/dL 3.31  2.56  2.29   Sodium 135 - 145 mmol/L 129   130   Potassium 3.5 - 5.1 mmol/L 4.8   4.2   Chloride 98 - 111 mmol/L 89   88   CO2 22 - 32 mmol/L 31   31   Calcium 8.9 - 10.3 mg/dL  9.2   9.6   Total Protein 6.5 - 8.1 g/dL   8.4   Total Bilirubin 0.3 - 1.2 mg/dL   1.0   Alkaline Phos 38 - 126 U/L   105   AST 15 - 41 U/L   19   ALT 0 - 44 U/L   17       Latest Ref Rng & Units 04/27/2022    9:31 AM 04/26/2022    8:10 PM 04/26/2022    1:28 PM  CBC  WBC 4.0 - 10.5 K/uL 19.3  17.2  15.2   Hemoglobin 12.0 - 15.0 g/dL 11.6  11.9  11.5   Hematocrit 36.0 - 46.0 % 37.5  38.2  37.3   Platelets 150 - 400 K/uL 367  393  349     RADS: CLINICAL DATA:  Abdominal pain, acute, nonlocalized.   EXAM: CT ABDOMEN AND PELVIS WITHOUT CONTRAST   TECHNIQUE: Multidetector CT imaging of the abdomen and pelvis was performed following the standard protocol without IV contrast.   RADIATION DOSE REDUCTION: This exam was performed according to the departmental dose-optimization program which includes automated exposure control, adjustment  of the mA and/or kV according to patient size and/or use of iterative reconstruction technique.   COMPARISON:  Ultrasound 07/24/2021.  CT 08/30/2010   FINDINGS: Lower chest: Mild atelectasis or scarring at the right lung base.   Hepatobiliary: Liver parenchyma appears normal without contrast. Previous cholecystectomy. No sign of ductal dilatation given that.   Pancreas: Normal   Spleen: Normal   Adrenals/Urinary Tract: Adrenal glands are normal. Kidneys are normal. Bladder is normal.   Stomach/Bowel: Stomach is fluid-filled. Numerous dilated fluid and air-filled loops of small intestine with transition zone in the ileum consistent with small bowel obstruction. Specific etiology is not identified. No abnormal colon finding.   Vascular/Lymphatic: Aortic atherosclerosis, mild. No aneurysm. IVC is normal. No adenopathy.   Reproductive: No adnexal mass.  Small uterine lipoma.   Other: No free fluid or air.   Musculoskeletal: Ordinary mild spinal degenerative changes.   IMPRESSION: 1. Small-bowel obstruction with transition zone in the ileum. Specific etiology is not identified. 2. Aortic atherosclerosis.   Aortic Atherosclerosis (ICD10-I70.0).     Electronically Signed   By: Nelson Chimes M.D.   On: 04/26/2022 18:32 Assessment:   Small bowel obstruction  Plan:     Discussed pathophisiology and treatment options including NG tube decompression and possible surgery for lysis of adhesions, laparoscopic or open.  The risk of surgery include, but not limited to, recurrence, bleeding, chronic pain, post-op infxn, post-op SBO or ileus, hernias, resection of bowel, re-anastamosis, possible ostomy placement and need for re-operation to address said risks. The risks of general anesthetic, if used, includes MI, CVA, sudden death or even reaction to anesthetic medications also discussed. Alternatives include continued observation and NG decompression.  Benefits include possible  symptom relief, preventing further decline in health and possible death.  Typical post-op recovery time of additional days in hospital for observation afterwards also discussed.  The patient verbalized understanding and all questions were answered to the patient's satisfaction.  Patient still refusing NG tube despite understanding only other treatment modality at this point will be exploratory laparotomy.  Nausea currently is controlled, so she is willing to try the oral contrast by mouth to see if we can see any improvement with the small bowel follow-through study.  She understands that proceeding with exploratory laparotomy has significantly high risk for her due to her comorbidities including morbid  obesity and kidney issues.  Continue present management and encourage NG tube placement.  Will follow-up on small bowel follow-through  labs/images/medications/previous chart entries reviewed personally and relevant changes/updates noted above.

## 2022-04-27 NOTE — ED Notes (Signed)
First encounter with the pt, she arrived to the ED with a c/o abd pain n/v x 3 days. The pt is noted to have a SBO, the pt has refused NG tube placement, MD. Damita Dunnings, has been made aware. Close monitoring continued.

## 2022-04-28 ENCOUNTER — Inpatient Hospital Stay: Payer: Medicare Other

## 2022-04-28 DIAGNOSIS — K56609 Unspecified intestinal obstruction, unspecified as to partial versus complete obstruction: Secondary | ICD-10-CM | POA: Diagnosis not present

## 2022-04-28 LAB — PHOSPHORUS: Phosphorus: 4.6 mg/dL (ref 2.5–4.6)

## 2022-04-28 LAB — BASIC METABOLIC PANEL
Anion gap: 12 (ref 5–15)
BUN: 63 mg/dL — ABNORMAL HIGH (ref 8–23)
CO2: 29 mmol/L (ref 22–32)
Calcium: 9 mg/dL (ref 8.9–10.3)
Chloride: 91 mmol/L — ABNORMAL LOW (ref 98–111)
Creatinine, Ser: 4.75 mg/dL — ABNORMAL HIGH (ref 0.44–1.00)
GFR, Estimated: 9 mL/min — ABNORMAL LOW (ref >60)
Glucose, Bld: 113 mg/dL — ABNORMAL HIGH (ref 70–99)
Potassium: 4.6 mmol/L (ref 3.5–5.1)
Sodium: 132 mmol/L — ABNORMAL LOW (ref 135–145)

## 2022-04-28 LAB — CBC
HCT: 36.6 % (ref 36.0–46.0)
Hemoglobin: 11.1 g/dL — ABNORMAL LOW (ref 12.0–15.0)
MCH: 24.4 pg — ABNORMAL LOW (ref 26.0–34.0)
MCHC: 30.3 g/dL (ref 30.0–36.0)
MCV: 80.4 fL (ref 80.0–100.0)
Platelets: 361 K/uL (ref 150–400)
RBC: 4.55 MIL/uL (ref 3.87–5.11)
RDW: 16.3 % — ABNORMAL HIGH (ref 11.5–15.5)
WBC: 14.1 K/uL — ABNORMAL HIGH (ref 4.0–10.5)
nRBC: 0 % (ref 0.0–0.2)

## 2022-04-28 LAB — MAGNESIUM: Magnesium: 2.1 mg/dL (ref 1.7–2.4)

## 2022-04-28 MED ORDER — SODIUM CHLORIDE 0.9 % IV SOLN
12.5000 mg | Freq: Once | INTRAVENOUS | Status: AC
  Administered 2022-04-28: 12.5 mg via INTRAVENOUS
  Filled 2022-04-28: qty 0.5

## 2022-04-28 MED ORDER — HEPARIN SODIUM (PORCINE) 5000 UNIT/ML IJ SOLN
5000.0000 [IU] | Freq: Three times a day (TID) | INTRAMUSCULAR | Status: DC
  Administered 2022-04-28 – 2022-04-30 (×7): 5000 [IU] via SUBCUTANEOUS
  Filled 2022-04-28 (×7): qty 1

## 2022-04-28 MED ORDER — PANTOPRAZOLE SODIUM 40 MG IV SOLR
40.0000 mg | Freq: Two times a day (BID) | INTRAVENOUS | Status: DC
  Administered 2022-04-28 – 2022-05-14 (×33): 40 mg via INTRAVENOUS
  Filled 2022-04-28 (×32): qty 10

## 2022-04-28 NOTE — Progress Notes (Signed)
       CROSS COVER NOTE  NAME: Samantha Mason MRN: [id number] DOB : 06-Dec-1950 ATTENDING PHYSICIAN: Val Riles, MD    Date of Service   04/28/2022   HPI/Events of Note   Nausea unrelieved by zofran  Interventions   Assessment/Plan: Phenergan X X      This document was prepared using Dragon voice recognition software and may include unintentional dictation errors.  Neomia Glass DNP, MBA, FNP-BC Nurse Practitioner [facility] Pager [phone]

## 2022-04-28 NOTE — Progress Notes (Signed)
Subjective:  CC: Samantha Mason is a 71 y.o. female  Hospital stay day 2,   small bowel obstruction  HPI: No acute issues overnight.  Still no bowel movements.  Patient states pain still present in epigastric area  ROS:  General: Denies weight loss, weight gain, fatigue, fevers, chills, and night sweats. Heart: Denies chest pain, palpitations, racing heart, irregular heartbeat, leg pain or swelling, and decreased activity tolerance. Respiratory: Denies breathing difficulty, shortness of breath, wheezing, cough, and sputum. GI: Denies change in appetite, heartburn, nausea, vomiting, constipation, diarrhea, and blood in stool. GU: Denies difficulty urinating, pain with urinating, urgency, frequency, blood in urine.   Objective:   Temp:  [97.5 F (36.4 C)-98.6 F (37 C)] 98.6 F (37 C) (12/10 0823) Pulse Rate:  [109-112] 110 (12/10 0823) Resp:  [16-18] 18 (12/10 0823) BP: (119-139)/(61-73) 129/67 (12/10 0823) SpO2:  [91 %-97 %] 96 % (12/10 0823)     Height: '5\' 1"'$  (154.9 cm) Weight: (!) 148.8 kg BMI (Calculated): 62.01   Intake/Output this shift:   Intake/Output Summary (Last 24 hours) at 04/28/2022 1445 Last data filed at 04/28/2022 0133 Gross per 24 hour  Intake 378.87 ml  Output 2 ml  Net 376.87 ml    Constitutional :  alert, cooperative, appears stated age, and no distress  Respiratory:  clear to auscultation bilaterally  Cardiovascular:  regular rate and rhythm  Gastrointestinal: Soft, no guarding, tenderness to palpation in epigastric region .   Skin: Cool and moist.   Psychiatric: Normal affect, non-agitated, not confused       LABS:     Latest Ref Rng & Units 04/28/2022    6:06 AM 04/27/2022    9:31 AM 04/26/2022    8:10 PM  CMP  Glucose 70 - 99 mg/dL 113  134    BUN 8 - 23 mg/dL 63  42    Creatinine 0.44 - 1.00 mg/dL 4.75  3.31  2.56   Sodium 135 - 145 mmol/L 132  129    Potassium 3.5 - 5.1 mmol/L 4.6  4.8    Chloride 98 - 111 mmol/L 91  89    CO2 22 - 32  mmol/L 29  31    Calcium 8.9 - 10.3 mg/dL 9.0  9.2        Latest Ref Rng & Units 04/28/2022    6:06 AM 04/27/2022    9:31 AM 04/26/2022    8:10 PM  CBC  WBC 4.0 - 10.5 K/uL 14.1  19.3  17.2   Hemoglobin 12.0 - 15.0 g/dL 11.1  11.6  11.9   Hematocrit 36.0 - 46.0 % 36.6  37.5  38.2   Platelets 150 - 400 K/uL 361  367  393     RADS: Narrative & Impression  CLINICAL DATA:  Small-bowel obstruction, 8 hour follow-up film   EXAM: PORTABLE ABDOMEN - 1 VIEW   COMPARISON:  CT from the previous day.   FINDINGS: Scattered large and small bowel gas is noted. Persistent small bowel dilatation is noted proximally. Administered contrast lies predominately within the stomach although some contrast is noted within the small bowel. This extends distally into the ileum beyond the transition zone seen on prior CT examination. Minimal contrast is noted within the right colon. No free air is seen. No other focal abnormality is noted.   IMPRESSION: Administered contrast has reached the right colon although persistent small bowel dilatation remains.     Electronically Signed   By: Linus Mako.D.  On: 04/27/2022 20:00   Assessment:   Small bowel obstruction with contrast reaching the right colon as noted above on KUB.  Abdominal exam largely remains unchanged so we will continue n.p.o. status until return of bowel function.  Repeat KUB in the a.m.  labs/images/medications/previous chart entries reviewed personally and relevant changes/updates noted above.

## 2022-04-28 NOTE — Progress Notes (Signed)
[facility] Progress Note  Patient: Samantha Mason    TOI:[id number]  DOA: 04/26/2022     Date of Service: the patient was seen and examined on 04/28/2022  Chief Complaint  Patient presents with   Abdominal Pain   Brief hospital course: Samantha Mason is a 71 y.o. female with medical history significant for diastolic CHF, and COPD with chronic respiratory failure on 2 L nasal cannula as well as obstructive sleep apnea on CPAP, stage IIIb chronic kidney disease, hypertension and morbid obesity who presents to the ED with a 3-day history of crampy upper abdominal pain and a 1 day history of nonbloody nonbilious vomiting.  She denies fever or chills, diarrhea or dysuria.  Denies chest pain or shortness of breath. ED course and data review: BP 160/83 with pulse of 103 and otherwise normal vitals.  Labs with WBC 15,200.  Sodium 130, creatinine2.29, up from baseline of 1.96.  Lipase and LFTs within normal limits.  CT abdomen and pelvis showing SBO as described below: IMPRESSION: 1. Small-bowel obstruction with transition zone in the ileum. Specific etiology is not identified. 2. Aortic atherosclerosis.   Patient treated with morphine Reglan and famotidine.  The ED provider: Spoke with surgeon, Dr. Lysle Mykayla who will see patient.  Plan for conservative management.   Hospitalist consulted for admission.  Patient repeatedly refused NG tube placement ordered by ED after an initial unsuccessful attempt   Assessment and Plan: * SBO (small bowel obstruction) ([place]) CT showing small bowel obstruction with transition zone in the ileum Patient repeatedly refused NG tube placement ordered by ED after an initial unsuccessful attempt IV hydration, antiemetics and pain control Surgical consult done, recommended conservative management SBFT Administered contrast has reached the right colon although persistent small bowel dilatation remains.  AKI on CKD (chronic kidney disease) stage 4, GFR 15-29 ml/min  (HCC) Renal function near baseline at 2.29 US renal: Limited study due to the patient's body habitus. 2. Findings consistent with medical renal disease with bilateral renal cortical thinning and increased renal parenchymal echogenicity. No renal mass. No hydronephrosis.  Bladder scan could not be done by RN due to body habitus Cr 2.56--3.31--4.75 Creatinine is getting worse, as per RN patient is refusing IV fluid, instructed to restart IV fluid, patient may be dehydrated. Continue IV fluid for hydration, monitor renal functions and monitor urine output. Nephrology consulted for AKI.   Chronic diastolic CHF (congestive heart failure) (HCC) Clinically euvolemic to dry Hold oral meds while n.p.o. Monitor for fluid overload in view of IV hydration with as needed IV Lasix Daily weights   Essential hypertension Hydralazine IV as needed for elevated blood pressure while n.p.o. Will hold diltiazem, metolazone and torsemide while n.p.o.     Chronic respiratory failure with hypoxia and hypercapnia (HCC) O2 requirement at baseline Continue supplemental oxygen   Obstructive lung disease (generalized) ([place]) Not acutely exacerbated Continue Pulmicort twice daily with DuoNebs as needed   OSA  CPAP nightly   Morbid obesity with BMI of 50.0-59.9, adult (HCC) Complicating factor to overall prognosis and care Body mass index is 61.98 kg/m.  Interventions: Counseling done       Diet: NPO DVT Prophylaxis: Subcutaneous Heparin    Advance goals of care discussion: Full code  Family Communication: family was NOT present at bedside, at the time of interview.  The pt provided permission to discuss medical plan with the family. Opportunity was given to ask question and all questions were answered satisfactorily.   Disposition:  Pt is  from Home, admitted with SBO, still has SBO and NPO IVF , which precludes a safe discharge. Discharge to Home, when in 1-2 days.  Subjective: No significant  overnight events, patient still has abdominal pain 8/10 mostly in the upper belly, patient had 1 BM liquidy, not passing gas.  Denies any nausea vomiting.  No any other complaints no chest pain or palpitation, no shortness of breath.    Physical Exam: General:  alert oriented to time, place, and person.  Appear in mild distress, affect appropriate Eyes: PERRLA ENT: Oral Mucosa Clear, moist  Neck: no JVD,  Cardiovascular: S1 and S2 Present, no Murmur,  Respiratory: good respiratory effort, Bilateral Air entry equal and Decreased, no Crackles, no wheezes Abdomen: Bowel Sound present, Soft and upper Abd tenderness,  Skin: No rashes Extremities: no Pedal edema, no calf tenderness Neurologic: without any new focal findings Gait not checked due to patient safety concerns  Vitals:   04/28/22 0414 04/28/22 0612 04/28/22 0734 04/28/22 0823  BP: 134/61   129/67  Pulse: (!) 111   (!) 110  Resp: 16   18  Temp: (!) 97.5 F (36.4 C) 98.5 F (36.9 C)  98.6 F (37 C)  TempSrc: Oral Oral  Oral  SpO2: 91%  92% 96%  Weight:      Height:        Intake/Output Summary (Last 24 hours) at 04/28/2022 1235 Last data filed at 04/28/2022 0133 Gross per 24 hour  Intake 378.87 ml  Output 2 ml  Net 376.87 ml   Filed Weights   04/26/22 2014 04/27/22 0520  Weight: (!) 148.8 kg (!) 148.8 kg    Data Reviewed: I have personally reviewed and interpreted daily labs, tele strips, imagings as discussed above. I reviewed all nursing notes, pharmacy notes, vitals, pertinent old records I have discussed plan of care as described above with RN and patient/family.  CBC: Recent Labs  Lab 04/26/22 1328 04/26/22 2010 04/27/22 0931 04/28/22 0606  WBC 15.2* 17.2* 19.3* 14.1*  HGB 11.5* 11.9* 11.6* 11.1*  HCT 37.3 38.2 37.5 36.6  MCV 79.4* 78.9* 79.4* 80.4  PLT 349 393 367 416   Basic Metabolic Panel: Recent Labs  Lab 04/26/22 1328 04/26/22 2010 04/27/22 0931 04/28/22 0606  NA 130*  --  129* 132*   K 4.2  --  4.8 4.6  CL 88*  --  89* 91*  CO2 31  --  31 29  GLUCOSE 132*  --  134* 113*  BUN 36*  --  42* 63*  CREATININE 2.29* 2.56* 3.31* 4.75*  CALCIUM 9.6  --  9.2 9.0  MG  --   --  1.7 2.1  PHOS  --   --  4.6 4.6    Studies: US RENAL  Result Date: 04/28/2022 CLINICAL DATA:  Acute kidney injury. EXAM: RENAL / URINARY TRACT ULTRASOUND COMPLETE COMPARISON:  CT, 04/26/2022. FINDINGS: Right Kidney: Renal measurements: 9.2 x 4.6 x 4 cm = volume: 88 mL. Diffuse cortical thinning. Mild increased parenchymal echogenicity. No mass, stone or hydronephrosis. Left Kidney: Renal measurements: 10.4 x 4.6 x 3.3 cm = volume: 82 mL. Diffuse cortical thinning. Mild increased parenchymal echogenicity. No mass, stone or hydronephrosis. Bladder: Not visualized. Other: None. IMPRESSION: 1. Limited study due to the patient's body habitus. 2. Findings consistent with medical renal disease with bilateral renal cortical thinning and increased renal parenchymal echogenicity. No renal mass. No hydronephrosis. Electronically Signed   By: Lajean Manes M.D.   On: 04/28/2022 09:36  DG Abd Portable 1V-Small Bowel Obstruction Protocol-initial, 8 hr delay  Result Date: 04/27/2022 CLINICAL DATA:  Small-bowel obstruction, 8 hour follow-up film EXAM: PORTABLE ABDOMEN - 1 VIEW COMPARISON:  CT from the previous day. FINDINGS: Scattered large and small bowel gas is noted. Persistent small bowel dilatation is noted proximally. Administered contrast lies predominately within the stomach although some contrast is noted within the small bowel. This extends distally into the ileum beyond the transition zone seen on prior CT examination. Minimal contrast is noted within the right colon. No free air is seen. No other focal abnormality is noted. IMPRESSION: Administered contrast has reached the right colon although persistent small bowel dilatation remains. Electronically Signed   By: Inez Catalina M.D.   On: 04/27/2022 20:00     Scheduled Meds:  budesonide  0.5 mg Nebulization BID   heparin injection (subcutaneous)  5,000 Units Subcutaneous Q8H   ipratropium-albuterol  3 mL Nebulization Q4H   pantoprazole (PROTONIX) IV  40 mg Intravenous Q12H   Continuous Infusions:  lactated ringers 125 mL/hr at 04/28/22 1226   PRN Meds: acetaminophen **OR** acetaminophen, hydrALAZINE, morphine injection, ondansetron **OR** ondansetron (ZOFRAN) IV  Time spent: 35 minutes  Author: Val Riles. MD [facility] 04/28/2022 12:35 PM  To reach On-call, see care teams to locate the attending and reach out to them via [email or web address]. If 7PM-7AM, please contact night-coverage If you still have difficulty reaching the attending provider, please page the [facility] (Director on Call) for [facility] on amion for assistance.

## 2022-04-29 ENCOUNTER — Telehealth: Payer: Self-pay

## 2022-04-29 ENCOUNTER — Inpatient Hospital Stay: Payer: Medicare Other

## 2022-04-29 DIAGNOSIS — G4733 Obstructive sleep apnea (adult) (pediatric): Secondary | ICD-10-CM

## 2022-04-29 DIAGNOSIS — K56609 Unspecified intestinal obstruction, unspecified as to partial versus complete obstruction: Secondary | ICD-10-CM | POA: Diagnosis not present

## 2022-04-29 LAB — CBC
HCT: 34.7 % — ABNORMAL LOW (ref 36.0–46.0)
Hemoglobin: 10.6 g/dL — ABNORMAL LOW (ref 12.0–15.0)
MCH: 24.4 pg — ABNORMAL LOW (ref 26.0–34.0)
MCHC: 30.5 g/dL (ref 30.0–36.0)
MCV: 79.8 fL — ABNORMAL LOW (ref 80.0–100.0)
Platelets: 350 10*3/uL (ref 150–400)
RBC: 4.35 MIL/uL (ref 3.87–5.11)
RDW: 16.2 % — ABNORMAL HIGH (ref 11.5–15.5)
WBC: 10.6 10*3/uL — ABNORMAL HIGH (ref 4.0–10.5)
nRBC: 0 % (ref 0.0–0.2)

## 2022-04-29 LAB — BASIC METABOLIC PANEL
Anion gap: 13 (ref 5–15)
BUN: 73 mg/dL — ABNORMAL HIGH (ref 8–23)
CO2: 27 mmol/L (ref 22–32)
Calcium: 9 mg/dL (ref 8.9–10.3)
Chloride: 92 mmol/L — ABNORMAL LOW (ref 98–111)
Creatinine, Ser: 5.17 mg/dL — ABNORMAL HIGH (ref 0.44–1.00)
GFR, Estimated: 8 mL/min — ABNORMAL LOW (ref >60)
Glucose, Bld: 97 mg/dL (ref 70–99)
Potassium: 4.3 mmol/L (ref 3.5–5.1)
Sodium: 132 mmol/L — ABNORMAL LOW (ref 135–145)

## 2022-04-29 LAB — PHOSPHORUS: Phosphorus: 4.5 mg/dL (ref 2.5–4.6)

## 2022-04-29 LAB — MAGNESIUM: Magnesium: 1.9 mg/dL (ref 1.7–2.4)

## 2022-04-29 MED ORDER — IPRATROPIUM-ALBUTEROL 0.5-2.5 (3) MG/3ML IN SOLN
3.0000 mL | Freq: Four times a day (QID) | RESPIRATORY_TRACT | Status: DC
  Administered 2022-04-29 – 2022-05-01 (×7): 3 mL via RESPIRATORY_TRACT
  Filled 2022-04-29 (×9): qty 3

## 2022-04-29 MED ORDER — METOPROLOL TARTRATE 5 MG/5ML IV SOLN
5.0000 mg | Freq: Once | INTRAVENOUS | Status: AC
  Administered 2022-04-30: 5 mg via INTRAVENOUS
  Filled 2022-04-29: qty 5

## 2022-04-29 MED ORDER — SODIUM CHLORIDE 0.9 % IV SOLN
12.5000 mg | Freq: Once | INTRAVENOUS | Status: AC
  Administered 2022-04-29: 12.5 mg via INTRAVENOUS
  Filled 2022-04-29: qty 0.5

## 2022-04-29 NOTE — Telephone Encounter (Signed)
Received note from [employer]. Patient is non- complaint with NIV. Dr. Mortimer Fries recommend bipap titration sleep and titrate to NIV if needed.    Patient currently admitted. Will contact patient once discharged.

## 2022-04-29 NOTE — Progress Notes (Signed)
Subjective:  CC: Samantha Mason is a 71 y.o. female  Hospital stay day 3,   small bowel obstruction  HPI: No acute issues overnight.  Bowel movement reported.  ROS:  General: Denies weight loss, weight gain, fatigue, fevers, chills, and night sweats. Heart: Denies chest pain, palpitations, racing heart, irregular heartbeat, leg pain or swelling, and decreased activity tolerance. Respiratory: Denies breathing difficulty, shortness of breath, wheezing, cough, and sputum. GI: Denies change in appetite, heartburn, nausea, vomiting, constipation, diarrhea, and blood in stool. GU: Denies difficulty urinating, pain with urinating, urgency, frequency, blood in urine.   Objective:   Temp:  [98.1 F (36.7 C)-99 F (37.2 C)] 98.8 F (37.1 C) (12/11 2103) Pulse Rate:  [101-117] 117 (12/11 2103) Resp:  [16-20] 20 (12/11 2103) BP: (111-151)/(47-76) 151/76 (12/11 2103) SpO2:  [89 %-96 %] 89 % (12/11 2103) Weight:  [147.2 kg] 147.2 kg (12/11 0650)     Height: '5\' 1"'$  (154.9 cm) Weight: (!) 147.2 kg BMI (Calculated): 61.35   Intake/Output this shift:   Intake/Output Summary (Last 24 hours) at 04/29/2022 2121 Last data filed at 04/29/2022 1600 Gross per 24 hour  Intake 3834.16 ml  Output 200 ml  Net 3634.16 ml    Constitutional :  alert, cooperative, appears stated age, and no distress  Respiratory:  clear to auscultation bilaterally  Cardiovascular:  regular rate and rhythm  Gastrointestinal: Soft, no guarding, tenderness to palpation in epigastric region minimally improved. .   Skin: Cool and moist.   Psychiatric: Normal affect, non-agitated, not confused       LABS:     Latest Ref Rng & Units 04/29/2022    4:32 AM 04/28/2022    6:06 AM 04/27/2022    9:31 AM  CMP  Glucose 70 - 99 mg/dL 97  113  134   BUN 8 - 23 mg/dL 73  63  42   Creatinine 0.44 - 1.00 mg/dL 5.17  4.75  3.31   Sodium 135 - 145 mmol/L 132  132  129   Potassium 3.5 - 5.1 mmol/L 4.3  4.6  4.8   Chloride 98 - 111  mmol/L 92  91  89   CO2 22 - 32 mmol/L '27  29  31   '$ Calcium 8.9 - 10.3 mg/dL 9.0  9.0  9.2       Latest Ref Rng & Units 04/29/2022    4:32 AM 04/28/2022    6:06 AM 04/27/2022    9:31 AM  CBC  WBC 4.0 - 10.5 K/uL 10.6  14.1  19.3   Hemoglobin 12.0 - 15.0 g/dL 10.6  11.1  11.6   Hematocrit 36.0 - 46.0 % 34.7  36.6  37.5   Platelets 150 - 400 K/uL 350  361  367     RADS: Narrative & Impression  CLINICAL DATA:  Small bowel obstruction.   EXAM: ABDOMEN - 1 VIEW   COMPARISON:  April 27, 2022.   FINDINGS: Continued small bowel dilatation is noted which may be slightly decreased compared to prior exam. Residual contrast is seen throughout the nondilated colon.   IMPRESSION: Continued small bowel dilatation is noted which may be slightly decreased compared to prior exam, although residual contrast is noted throughout nondilated colon.     Electronically Signed   By: Marijo Conception M.D.   On: 04/29/2022 08:11     Assessment:   Improving exam with recorded bowel movement decreased distention noted on KUB.  Will start clear liquid diet trial and  continue to monitor.  labs/images/medications/previous chart entries reviewed personally and relevant changes/updates noted above.

## 2022-04-29 NOTE — Progress Notes (Addendum)
Patient seen during 2000 rounds. Patient experiencing nausea at the time and requested to hold off on breathing treatment. Patient has requested breathing treatment at this time, patient found to have elevated heart rate of 135 and 88% SAT on room air. Duoneb not given at this time due to patient's elevated heart rate. Patient placed on 2L [place]. RN made aware

## 2022-04-29 NOTE — Progress Notes (Signed)
[facility] Progress Note  Patient: Samantha Mason    NOB:[id number]  DOA: 04/26/2022     Date of Service: the patient was seen and examined on 04/29/2022  Chief Complaint  Patient presents with   Abdominal Pain   Brief hospital course: Samantha Mason is a 71 y.o. female with medical history significant for diastolic CHF, and COPD with chronic respiratory failure on 2 L nasal cannula as well as obstructive sleep apnea on CPAP, stage IIIb chronic kidney disease, hypertension and morbid obesity who presents to the ED with a 3-day history of crampy upper abdominal pain and a 1 day history of nonbloody nonbilious vomiting.  She denies fever or chills, diarrhea or dysuria.  Denies chest pain or shortness of breath. ED course and data review: BP 160/83 with pulse of 103 and otherwise normal vitals.  Labs with WBC 15,200.  Sodium 130, creatinine2.29, up from baseline of 1.96.  Lipase and LFTs within normal limits.  CT abdomen and pelvis showing SBO as described below: IMPRESSION: 1. Small-bowel obstruction with transition zone in the ileum. Specific etiology is not identified. 2. Aortic atherosclerosis.   Patient treated with morphine Reglan and famotidine.  The ED provider: Spoke with surgeon, Dr. Lysle Baudelia who will see patient.  Plan for conservative management.   Hospitalist consulted for admission.  Patient repeatedly refused NG tube placement ordered by ED after an initial unsuccessful attempt   Assessment and Plan: * SBO (small bowel obstruction) ([place]) CT showing small bowel obstruction with transition zone in the ileum Patient repeatedly refused NG tube placement ordered by ED after an initial unsuccessful attempt IV hydration, antiemetics and pain control Surgical consult done, recommended conservative management SBFT Administered contrast has reached the right colon although persistent small bowel dilatation remains. 12/11 AXR: Continued small bowel dilatation is noted which may  be slightly decreased compared to prior exam, although residual contrast is noted throughout nondilated colon. Patient was started on clear liquid diet as per general surgery rec   AKI on CKD (chronic kidney disease) stage 4, GFR 15-29 ml/min (HCC) Renal function near baseline at 2.29 US renal: Limited study due to the patient's body habitus. 2. Findings consistent with medical renal disease with bilateral renal cortical thinning and increased renal parenchymal echogenicity. No renal mass. No hydronephrosis.  Bladder scan could not be done by RN due to body habitus Cr 2.56--3.31--4.75--5.17 Creatinine is getting worse, as per RN patient is refusing IV fluid, instructed to restart IV fluid, patient may be dehydrated. Continue IV fluid for hydration, monitor renal functions and monitor urine output. Nephrology consulted for AKI. 12/11 urine output is very low, patient refused Foley catheter insertion  Chronic diastolic CHF (congestive heart failure) (HCC) Clinically euvolemic to dry Hold oral meds while n.p.o. Monitor for fluid overload in view of IV hydration with as needed IV Lasix Daily weights   Essential hypertension Hydralazine IV as needed for elevated blood pressure while n.p.o. Will hold diltiazem, metolazone and torsemide while n.p.o.     Chronic respiratory failure with hypoxia and hypercapnia (HCC) O2 requirement at baseline Continue supplemental oxygen   Obstructive lung disease (generalized) ([place]) Not acutely exacerbated Continue Pulmicort twice daily with DuoNebs as needed   OSA  CPAP nightly   Morbid obesity with BMI of 50.0-59.9, adult (HCC) Complicating factor to overall prognosis and care Body mass index is 61.98 kg/m.  Interventions: Counseling done       Diet: Clear liquid diet DVT Prophylaxis: Subcutaneous Heparin    Advance  goals of care discussion: Full code  Family Communication: family was NOT present at bedside, at the time of interview.  The  pt provided permission to discuss medical plan with the family. Opportunity was given to ask question and all questions were answered satisfactorily.   Disposition:  Pt is from Home, admitted with SBO, still has SBO and NPO IVF , which precludes a safe discharge. Discharge to Home, when in 1-2 days.  Subjective: No significant overnight events, patient is burping but she is not passing gas.  Patient had 2 loose small liquidy BM yesterday but stated it was not actually bowel movement but sufficient secretions to wipe her.  No BM today.  Patient had abdominal pain 8/10.  Patient is having very little urine output, still does not want Foley catheter to be inserted.    Physical Exam: General:  alert oriented to time, place, and person.  Appear in mild distress, affect appropriate Eyes: PERRLA ENT: Oral Mucosa Clear, moist  Neck: no JVD,  Cardiovascular: S1 and S2 Present, no Murmur,  Respiratory: good respiratory effort, Bilateral Air entry equal and Decreased, no Crackles, no wheezes Abdomen: Bowel Sound present, Soft and upper Abd tenderness,  Skin: No rashes Extremities: no Pedal edema, no calf tenderness Neurologic: without any new focal findings Gait not checked due to patient safety concerns  Vitals:   04/29/22 0426 04/29/22 0650 04/29/22 0817 04/29/22 0829  BP: (!) 132/47  (!) 111/55   Pulse: (!) 110  (!) 109   Resp: 18  16   Temp: 98.1 F (36.7 C)  99 F (37.2 C)   TempSrc: Oral     SpO2: 96%  93% 92%  Weight:  (!) 147.2 kg    Height:        Intake/Output Summary (Last 24 hours) at 04/29/2022 1338 Last data filed at 04/29/2022 6269 Gross per 24 hour  Intake 1902.08 ml  Output --  Net 1902.08 ml   Filed Weights   04/26/22 2014 04/27/22 0520 04/29/22 0650  Weight: (!) 148.8 kg (!) 148.8 kg (!) 147.2 kg    Data Reviewed: I have personally reviewed and interpreted daily labs, tele strips, imagings as discussed above. I reviewed all nursing notes, pharmacy notes,  vitals, pertinent old records I have discussed plan of care as described above with RN and patient/family.  CBC: Recent Labs  Lab 04/26/22 1328 04/26/22 2010 04/27/22 0931 04/28/22 0606 04/29/22 0432  WBC 15.2* 17.2* 19.3* 14.1* 10.6*  HGB 11.5* 11.9* 11.6* 11.1* 10.6*  HCT 37.3 38.2 37.5 36.6 34.7*  MCV 79.4* 78.9* 79.4* 80.4 79.8*  PLT 349 393 367 361 485   Basic Metabolic Panel: Recent Labs  Lab 04/26/22 1328 04/26/22 2010 04/27/22 0931 04/28/22 0606 04/29/22 0432  NA 130*  --  129* 132* 132*  K 4.2  --  4.8 4.6 4.3  CL 88*  --  89* 91* 92*  CO2 31  --  '31 29 27  '$ GLUCOSE 132*  --  134* 113* 97  BUN 36*  --  42* 63* 73*  CREATININE 2.29* 2.56* 3.31* 4.75* 5.17*  CALCIUM 9.6  --  9.2 9.0 9.0  MG  --   --  1.7 2.1 1.9  PHOS  --   --  4.6 4.6 4.5    Studies: DG Abd 1 View  Result Date: 04/29/2022 CLINICAL DATA:  Small bowel obstruction. EXAM: ABDOMEN - 1 VIEW COMPARISON:  April 27, 2022. FINDINGS: Continued small bowel dilatation is noted which may be  slightly decreased compared to prior exam. Residual contrast is seen throughout the nondilated colon. IMPRESSION: Continued small bowel dilatation is noted which may be slightly decreased compared to prior exam, although residual contrast is noted throughout nondilated colon. Electronically Signed   By: Marijo Conception M.D.   On: 04/29/2022 08:11    Scheduled Meds:  budesonide  0.5 mg Nebulization BID   heparin injection (subcutaneous)  5,000 Units Subcutaneous Q8H   ipratropium-albuterol  3 mL Nebulization Q6H   pantoprazole (PROTONIX) IV  40 mg Intravenous Q12H   Continuous Infusions:  lactated ringers 125 mL/hr at 04/29/22 0856   PRN Meds: acetaminophen **OR** acetaminophen, hydrALAZINE, morphine injection, ondansetron **OR** ondansetron (ZOFRAN) IV  Time spent: 35 minutes  Author: Val Riles. MD [facility] 04/29/2022 1:38 PM  To reach On-call, see care teams to locate the attending and reach out  to them via [email or web address]. If 7PM-7AM, please contact night-coverage If you still have difficulty reaching the attending provider, please page the [facility] (Director on Call) for [facility] on amion for assistance.

## 2022-04-29 NOTE — Progress Notes (Incomplete)
       CROSS COVER NOTE  NAME: Samantha Mason MRN: [id number] DOB : 1950-12-15 ATTENDING PHYSICIAN: Val Riles, MD    Date of Service   04/29/2022   HPI/Events of Note   Temp 100.21F  Interventions   Assessment/Plan: Tylenol EKG- Sinus Tachycardia HR 136 ABG CT Head WO 5 mg IV metoprolol      This document was prepared using Dragon voice recognition software and may include unintentional dictation errors.  Neomia Glass DNP, MBA, FNP-BC Nurse Practitioner [facility] Pager [phone]

## 2022-04-29 NOTE — Progress Notes (Signed)
[facility]  ROUNDING NOTE   Subjective:   Samantha Mason is a 71 year old female with past medical history including COPD with renal failure requiring 2 L oxygen as needed, chronic diastolic heart failure, sleep apnea, morbid obesity, hypertension, and chronic kidney disease stage IV. Patient presents to the emergency department complaining of abdominal pain and has been admitted for Small bowel obstruction ([place]) [K56.609] SBO (small bowel obstruction) ([place]) [K56.609]  Patient is known to our practice and is followed by [facility]. She was last seen in office on 04/17/22.Patient seen sitting up in bed. States she has began having nausea and vomiting a couple days prior to arrival. She felt this was her gastritis. As the abdominal pain increased, she was encouraged to seek medical attention.   Labs on ED arrival include sodium 130, potassium 4.2, glucose 132, BUN 36, creatinine 2.29 with GFR 22, elevated WBC 15.2, and Hgb 11.5.  CT abdomen pelvis shows a small bowel obstruction.  Surgery consulted and recommending conservative care.  We have been consulted to manage acute kidney injury.   Objective:  Vital signs in last 24 hours:  Temp:  [98.1 F (36.7 C)-99 F (37.2 C)] 99 F (37.2 C) (12/11 0817) Pulse Rate:  [101-110] 109 (12/11 0817) Resp:  [16-18] 16 (12/11 0817) BP: (111-142)/(47-59) 111/55 (12/11 0817) SpO2:  [90 %-96 %] 92 % (12/11 0829) Weight:  [147.2 kg] 147.2 kg (12/11 0650)  Weight change:  Filed Weights   04/26/22 2014 04/27/22 0520 04/29/22 0650  Weight: (!) 148.8 kg (!) 148.8 kg (!) 147.2 kg    Intake/Output: I/O last 3 completed shifts: In: 1902.1 [I.V.:1902.1] Out: 2 [Urine:2]   Intake/Output this shift:  Total I/O In: 480 [P.O.:480] Out: -   Physical Exam: General: NAD  Head: Normocephalic, atraumatic.  Dry oral mucosal membranes  Eyes: Anicteric  Lungs:  Clear to auscultation, normal effort, room air  Heart: Regular rate  and rhythm  Abdomen:  Soft, nontender, obese  Extremities: No peripheral edema.  Neurologic: Alert and oriented, moving all four extremities  Skin: No lesions  Access: None    Basic Metabolic Panel: Recent Labs  Lab 04/26/22 1328 04/26/22 2010 04/27/22 0931 04/28/22 0606 04/29/22 0432  NA 130*  --  129* 132* 132*  K 4.2  --  4.8 4.6 4.3  CL 88*  --  89* 91* 92*  CO2 31  --  '31 29 27  '$ GLUCOSE 132*  --  134* 113* 97  BUN 36*  --  42* 63* 73*  CREATININE 2.29* 2.56* 3.31* 4.75* 5.17*  CALCIUM 9.6  --  9.2 9.0 9.0  MG  --   --  1.7 2.1 1.9  PHOS  --   --  4.6 4.6 4.5    Liver Function Tests: Recent Labs  Lab 04/26/22 1328  AST 19  ALT 17  ALKPHOS 105  BILITOT 1.0  PROT 8.4*  ALBUMIN 3.6   Recent Labs  Lab 04/26/22 1328  LIPASE 31   No results for input(s): "AMMONIA" in the last 168 hours.  CBC: Recent Labs  Lab 04/26/22 1328 04/26/22 2010 04/27/22 0931 04/28/22 0606 04/29/22 0432  WBC 15.2* 17.2* 19.3* 14.1* 10.6*  HGB 11.5* 11.9* 11.6* 11.1* 10.6*  HCT 37.3 38.2 37.5 36.6 34.7*  MCV 79.4* 78.9* 79.4* 80.4 79.8*  PLT 349 393 367 361 350    Cardiac Enzymes: No results for input(s): "CKTOTAL", "CKMB", "CKMBINDEX", "TROPONINI" in the last 168 hours.  BNP: Invalid input(s): "POCBNP"  CBG: No results for input(s): "GLUCAP" in the last 168 hours.  Microbiology: Results for orders placed or performed during the hospital encounter of 01/23/22  Resp Panel by RT-PCR (Flu A&B, Covid) Anterior Nasal Swab     Status: None   Collection Time: 01/23/22  6:01 PM   Specimen: Anterior Nasal Swab  Result Value Ref Range Status   SARS Coronavirus 2 by RT PCR NEGATIVE NEGATIVE Final    Comment: (NOTE) SARS-CoV-2 target nucleic acids are NOT DETECTED.  The SARS-CoV-2 RNA is generally detectable in upper respiratory specimens during the acute phase of infection. The lowest concentration of SARS-CoV-2 viral copies this assay can detect is 138 copies/mL. A  negative result does not preclude SARS-Cov-2 infection and should not be used as the sole basis for treatment or other patient management decisions. A negative result may occur with  improper specimen collection/handling, submission of specimen other than nasopharyngeal swab, presence of viral mutation(s) within the areas targeted by this assay, and inadequate number of viral copies(<138 copies/mL). A negative result must be combined with clinical observations, patient history, and epidemiological information. The expected result is Negative.  Fact Sheet for Patients:  EntrepreneurPulse.com.au  Fact Sheet for Healthcare Providers:  IncredibleEmployment.be  This test is no t yet approved or cleared by the [place] FDA and  has been authorized for detection and/or diagnosis of SARS-CoV-2 by FDA under an Emergency Use Authorization (EUA). This EUA will remain  in effect (meaning this test can be used) for the duration of the COVID-19 declaration under Section 564(b)(1) of the Act, 21 U.S.C.section 360bbb-3(b)(1), unless the authorization is terminated  or revoked sooner.       Influenza A by PCR NEGATIVE NEGATIVE Final   Influenza B by PCR NEGATIVE NEGATIVE Final    Comment: (NOTE) The Xpert Xpress SARS-CoV-2/FLU/RSV plus assay is intended as an aid in the diagnosis of influenza from Nasopharyngeal swab specimens and should not be used as a sole basis for treatment. Nasal washings and aspirates are unacceptable for Xpert Xpress SARS-CoV-2/FLU/RSV testing.  Fact Sheet for Patients: EntrepreneurPulse.com.au  Fact Sheet for Healthcare Providers: IncredibleEmployment.be  This test is not yet approved or cleared by the [place] FDA and has been authorized for detection and/or diagnosis of SARS-CoV-2 by FDA under an Emergency Use Authorization (EUA). This EUA will remain in effect (meaning this test can  be used) for the duration of the COVID-19 declaration under Section 564(b)(1) of the Act, 21 U.S.C. section 360bbb-3(b)(1), unless the authorization is terminated or revoked.  Performed at [facility], [address] [place],  [address]     Coagulation Studies: No results for input(s): "LABPROT", "INR" in the last 72 hours.  Urinalysis: No results for input(s): "COLORURINE", "LABSPEC", "PHURINE", "GLUCOSEU", "HGBUR", "BILIRUBINUR", "KETONESUR", "PROTEINUR", "UROBILINOGEN", "NITRITE", "LEUKOCYTESUR" in the last 72 hours.  Invalid input(s): "APPERANCEUR"    Imaging: DG Abd 1 View  Result Date: 04/29/2022 CLINICAL DATA:  Small bowel obstruction. EXAM: ABDOMEN - 1 VIEW COMPARISON:  April 27, 2022. FINDINGS: Continued small bowel dilatation is noted which may be slightly decreased compared to prior exam. Residual contrast is seen throughout the nondilated colon. IMPRESSION: Continued small bowel dilatation is noted which may be slightly decreased compared to prior exam, although residual contrast is noted throughout nondilated colon. Electronically Signed   By: Marijo Conception M.D.   On: 04/29/2022 08:11   US RENAL  Result Date: 04/28/2022 CLINICAL DATA:  Acute kidney injury. EXAM: RENAL / URINARY TRACT ULTRASOUND  COMPLETE COMPARISON:  CT, 04/26/2022. FINDINGS: Right Kidney: Renal measurements: 9.2 x 4.6 x 4 cm = volume: 88 mL. Diffuse cortical thinning. Mild increased parenchymal echogenicity. No mass, stone or hydronephrosis. Left Kidney: Renal measurements: 10.4 x 4.6 x 3.3 cm = volume: 82 mL. Diffuse cortical thinning. Mild increased parenchymal echogenicity. No mass, stone or hydronephrosis. Bladder: Not visualized. Other: None. IMPRESSION: 1. Limited study due to the patient's body habitus. 2. Findings consistent with medical renal disease with bilateral renal cortical thinning and increased renal parenchymal echogenicity. No renal mass. No hydronephrosis.  Electronically Signed   By: Lajean Manes M.D.   On: 04/28/2022 09:36   DG Abd Portable 1V-Small Bowel Obstruction Protocol-initial, 8 hr delay  Result Date: 04/27/2022 CLINICAL DATA:  Small-bowel obstruction, 8 hour follow-up film EXAM: PORTABLE ABDOMEN - 1 VIEW COMPARISON:  CT from the previous day. FINDINGS: Scattered large and small bowel gas is noted. Persistent small bowel dilatation is noted proximally. Administered contrast lies predominately within the stomach although some contrast is noted within the small bowel. This extends distally into the ileum beyond the transition zone seen on prior CT examination. Minimal contrast is noted within the right colon. No free air is seen. No other focal abnormality is noted. IMPRESSION: Administered contrast has reached the right colon although persistent small bowel dilatation remains. Electronically Signed   By: Inez Catalina M.D.   On: 04/27/2022 20:00     Medications:    lactated ringers 125 mL/hr at 04/29/22 0856    budesonide  0.5 mg Nebulization BID   heparin injection (subcutaneous)  5,000 Units Subcutaneous Q8H   ipratropium-albuterol  3 mL Nebulization Q6H   pantoprazole (PROTONIX) IV  40 mg Intravenous Q12H   acetaminophen **OR** acetaminophen, hydrALAZINE, morphine injection, ondansetron **OR** ondansetron (ZOFRAN) IV  Assessment/ Plan:  Samantha Mason is a 71 y.o.  female with past medical history including COPD with renal failure requiring 2 L oxygen as needed, chronic diastolic heart failure, sleep apnea, morbid obesity, hypertension, and chronic kidney disease stage IV. Patient presents to the emergency department complaining of shortness of breath and has been admitted for   Acute Kidney Injury on chronic kidney disease stage IV with baseline creatinine 2.01 and GFR of 26 on 01/29/22.  Acute kidney injury secondary to dehydration Chronic kidney disease is secondary to hypertension and CHF CT abd pelvis negative for  obstruction. No IV contrast exposure. Continue IVF, lactated ringers @ 137m/hr. No acute need for dialysis. Avoid all nephrotoxic agents and therapies, avoid hypotension.   Lab Results  Component Value Date   CREATININE 5.17 (H) 04/29/2022   CREATININE 4.75 (H) 04/28/2022   CREATININE 3.31 (H) 04/27/2022    Intake/Output Summary (Last 24 hours) at 04/29/2022 1448 Last data filed at 04/29/2022 1402 Gross per 24 hour  Intake 2382.08 ml  Output --  Net 2382.08 ml   2. Anemia of chronic kidney disease Lab Results  Component Value Date   HGB 10.6 (L) 04/29/2022    Hgb above target.   3.  Chronic diastolic heart failure.  Echo from 07/16/2021 shows EF 55 to 60% with mild LVH.  Fluid status currently maintained with metolazone 2.5 mg twice weekly.  Currently held.  4. Secondary Hyperparathyroidism: with outpatient labs: PTH 79, phosphorus 3.0, calcium 10.0 on 04/17/22.   Lab Results  Component Value Date   CALCIUM 9.0 04/29/2022   PHOS 4.5 04/29/2022    Will continue to monitor bone minerals during this admission.   LOS:  [place] 12/11/20232:48 PM

## 2022-04-29 NOTE — Progress Notes (Signed)
Order to place foley was placed by MD this morning. Patient has refused all attempts. Education given. Patient urinated 272m this afternoon. Per patient this is her baseline and she does not need a foley.

## 2022-04-30 ENCOUNTER — Inpatient Hospital Stay: Payer: Medicare Other

## 2022-04-30 DIAGNOSIS — R4182 Altered mental status, unspecified: Secondary | ICD-10-CM | POA: Diagnosis not present

## 2022-04-30 DIAGNOSIS — K56609 Unspecified intestinal obstruction, unspecified as to partial versus complete obstruction: Secondary | ICD-10-CM | POA: Diagnosis not present

## 2022-04-30 LAB — MAGNESIUM: Magnesium: 1.6 mg/dL — ABNORMAL LOW (ref 1.7–2.4)

## 2022-04-30 LAB — URINALYSIS, ROUTINE W REFLEX MICROSCOPIC
Bilirubin Urine: NEGATIVE
Glucose, UA: NEGATIVE mg/dL
Ketones, ur: NEGATIVE mg/dL
Leukocytes,Ua: NEGATIVE
Nitrite: NEGATIVE
Protein, ur: NEGATIVE mg/dL
RBC / HPF: >50 RBC/hpf — ABNORMAL HIGH (ref 0–5)
Specific Gravity, Urine: 1.016 (ref 1.005–1.030)
pH: 5 (ref 5.0–8.0)

## 2022-04-30 LAB — BLOOD GAS, ARTERIAL
Acid-Base Excess: 1.5 mmol/L (ref 0.0–2.0)
Bicarbonate: 26.6 mmol/L (ref 20.0–28.0)
O2 Content: 2 L/min
O2 Saturation: 97.7 %
Patient temperature: 39.4
pCO2 arterial: 48 mmHg (ref 32–48)
pH, Arterial: 7.36 (ref 7.35–7.45)
pO2, Arterial: 93 mmHg (ref 83–108)

## 2022-04-30 LAB — BASIC METABOLIC PANEL
Anion gap: 12 (ref 5–15)
Anion gap: 12 (ref 5–15)
BUN: 74 mg/dL — ABNORMAL HIGH (ref 8–23)
BUN: 77 mg/dL — ABNORMAL HIGH (ref 8–23)
CO2: 25 mmol/L (ref 22–32)
CO2: 26 mmol/L (ref 22–32)
Calcium: 8.9 mg/dL (ref 8.9–10.3)
Calcium: 9.1 mg/dL (ref 8.9–10.3)
Chloride: 94 mmol/L — ABNORMAL LOW (ref 98–111)
Chloride: 96 mmol/L — ABNORMAL LOW (ref 98–111)
Creatinine, Ser: 4.97 mg/dL — ABNORMAL HIGH (ref 0.44–1.00)
Creatinine, Ser: 5.37 mg/dL — ABNORMAL HIGH (ref 0.44–1.00)
GFR, Estimated: 8 mL/min — ABNORMAL LOW (ref >60)
GFR, Estimated: 9 mL/min — ABNORMAL LOW (ref >60)
Glucose, Bld: 92 mg/dL (ref 70–99)
Glucose, Bld: 98 mg/dL (ref 70–99)
Potassium: 4.7 mmol/L (ref 3.5–5.1)
Potassium: 4.8 mmol/L (ref 3.5–5.1)
Sodium: 132 mmol/L — ABNORMAL LOW (ref 135–145)
Sodium: 133 mmol/L — ABNORMAL LOW (ref 135–145)

## 2022-04-30 LAB — CBC
HCT: 32.2 % — ABNORMAL LOW (ref 36.0–46.0)
HCT: 35.2 % — ABNORMAL LOW (ref 36.0–46.0)
Hemoglobin: 10 g/dL — ABNORMAL LOW (ref 12.0–15.0)
Hemoglobin: 11 g/dL — ABNORMAL LOW (ref 12.0–15.0)
MCH: 24.3 pg — ABNORMAL LOW (ref 26.0–34.0)
MCH: 24.7 pg — ABNORMAL LOW (ref 26.0–34.0)
MCHC: 31.1 g/dL (ref 30.0–36.0)
MCHC: 31.3 g/dL (ref 30.0–36.0)
MCV: 78.3 fL — ABNORMAL LOW (ref 80.0–100.0)
MCV: 78.9 fL — ABNORMAL LOW (ref 80.0–100.0)
Platelets: 357 10*3/uL (ref 150–400)
Platelets: 357 10*3/uL (ref 150–400)
RBC: 4.11 MIL/uL (ref 3.87–5.11)
RBC: 4.46 MIL/uL (ref 3.87–5.11)
RDW: 16.1 % — ABNORMAL HIGH (ref 11.5–15.5)
RDW: 16.1 % — ABNORMAL HIGH (ref 11.5–15.5)
WBC: 15.3 10*3/uL — ABNORMAL HIGH (ref 4.0–10.5)
WBC: 21 10*3/uL — ABNORMAL HIGH (ref 4.0–10.5)
nRBC: 0 % (ref 0.0–0.2)
nRBC: 0 % (ref 0.0–0.2)

## 2022-04-30 LAB — LACTIC ACID, PLASMA
Lactic Acid, Venous: 1.1 mmol/L (ref 0.5–1.9)
Lactic Acid, Venous: 1.6 mmol/L (ref 0.5–1.9)

## 2022-04-30 LAB — PHOSPHORUS: Phosphorus: 3.8 mg/dL (ref 2.5–4.6)

## 2022-04-30 LAB — PROCALCITONIN: Procalcitonin: 1.99 ng/mL

## 2022-04-30 MED ORDER — SODIUM CHLORIDE 0.9 % IV SOLN
12.5000 mg | Freq: Four times a day (QID) | INTRAVENOUS | Status: DC | PRN
  Administered 2022-04-30 – 2022-05-02 (×4): 12.5 mg via INTRAVENOUS
  Filled 2022-04-30 (×2): qty 12.5
  Filled 2022-04-30: qty 0.5
  Filled 2022-04-30: qty 12.5
  Filled 2022-04-30: qty 0.5

## 2022-04-30 MED ORDER — LACTATED RINGERS IV BOLUS
2000.0000 mL | Freq: Once | INTRAVENOUS | Status: AC
  Administered 2022-04-30: 2000 mL via INTRAVENOUS

## 2022-04-30 MED ORDER — LACTATED RINGERS IV BOLUS (SEPSIS)
500.0000 mL | Freq: Once | INTRAVENOUS | Status: AC
  Administered 2022-04-30: 500 mL via INTRAVENOUS

## 2022-04-30 MED ORDER — PIPERACILLIN-TAZOBACTAM 3.375 G IVPB
3.3750 g | Freq: Three times a day (TID) | INTRAVENOUS | Status: DC
  Administered 2022-05-01 – 2022-05-02 (×4): 3.375 g via INTRAVENOUS
  Filled 2022-04-30 (×4): qty 50

## 2022-04-30 MED ORDER — VANCOMYCIN HCL 2000 MG/400ML IV SOLN
2000.0000 mg | Freq: Once | INTRAVENOUS | Status: DC
  Filled 2022-04-30: qty 400

## 2022-04-30 MED ORDER — LACTATED RINGERS IV BOLUS (SEPSIS)
1000.0000 mL | Freq: Once | INTRAVENOUS | Status: AC
  Administered 2022-04-30: 1000 mL via INTRAVENOUS

## 2022-04-30 MED ORDER — SODIUM CHLORIDE 0.9 % IV SOLN
2.0000 g | INTRAVENOUS | Status: DC
  Administered 2022-04-30: 2 g via INTRAVENOUS
  Filled 2022-04-30: qty 12.5

## 2022-04-30 MED ORDER — VANCOMYCIN HCL 1250 MG/250ML IV SOLN
1250.0000 mg | Freq: Once | INTRAVENOUS | Status: DC
  Filled 2022-04-30: qty 250

## 2022-04-30 MED ORDER — LACTATED RINGERS IV BOLUS
500.0000 mL | Freq: Once | INTRAVENOUS | Status: AC
  Administered 2022-04-30: 500 mL via INTRAVENOUS

## 2022-04-30 MED ORDER — VANCOMYCIN HCL IN DEXTROSE 1-5 GM/200ML-% IV SOLN: 1000.0000 mg | Freq: Once | INTRAVENOUS | Status: DC

## 2022-04-30 MED ORDER — VANCOMYCIN HCL 1250 MG/250ML IV SOLN
1250.0000 mg | Freq: Once | INTRAVENOUS | Status: DC
  Administered 2022-04-30: 1250 mg via INTRAVENOUS
  Filled 2022-04-30: qty 250

## 2022-04-30 MED ORDER — METRONIDAZOLE 500 MG/100ML IV SOLN
500.0000 mg | Freq: Two times a day (BID) | INTRAVENOUS | Status: DC
  Administered 2022-04-30: 500 mg via INTRAVENOUS
  Filled 2022-04-30 (×2): qty 100

## 2022-04-30 MED ORDER — IBUPROFEN 400 MG PO TABS
200.0000 mg | ORAL_TABLET | Freq: Once | ORAL | Status: DC
  Filled 2022-04-30: qty 1

## 2022-04-30 MED ORDER — VANCOMYCIN VARIABLE DOSE PER UNSTABLE RENAL FUNCTION (PHARMACIST DOSING): Status: DC

## 2022-04-30 NOTE — Progress Notes (Signed)
   04/29/22 2255  Assess: MEWS Score  Temp (!) 100.8 F (38.2 C)  BP (!) 122/50  MAP (mmHg) 67  Pulse Rate (!) 136  Resp (!) 28  SpO2 95 %  O2 Device Nasal Cannula  O2 Flow Rate (L/min) 3 L/min  Assess: MEWS Score  MEWS Temp 1  MEWS Systolic 0  MEWS Pulse 3  MEWS RR 2  MEWS LOC 0  MEWS Score 6  MEWS Score Color Red  Assess: if the MEWS score is Yellow or Red  Were vital signs taken at a resting state? Yes  Focused Assessment Change from prior assessment (see assessment flowsheet)  Does the patient meet 2 or more of the SIRS criteria? Yes  Does the patient have a confirmed or suspected source of infection? No  MEWS guidelines implemented *See Row Information* Yes  Treat  MEWS Interventions Administered prn meds/treatments  Take Vital Signs  Increase Vital Sign Frequency  Red: Q 1hr X 4 then Q 4hr X 4, if remains red, continue Q 4hrs  Escalate  MEWS: Escalate Red: discuss with charge nurse/RN and provider, consider discussing with RRT  Provider Notification  Provider Name/Title Neomia Glass NP  Date Provider Notified 04/29/22  Time Provider Notified 2300  Method of Notification Page;Face-to-face  Notification Reason Change in status;Critical Result  Provider response At bedside;See new orders  Date of Provider Response 04/29/22  Time of Provider Response 2310  Document  Patient Outcome Other (Comment) (c ontinuing to monitor)  Progress note created (see row info) Yes  Assess: SIRS CRITERIA  SIRS Temperature  0  SIRS Pulse 1  SIRS Respirations  1  SIRS WBC 0  SIRS Score Sum  2

## 2022-04-30 NOTE — Care Management Important Message (Signed)
Important Message  Patient Details  Name: Samantha Mason MRN: [id number] Date of Birth: February 11, 1951   Medicare Important Message Given:  N/A - LOS <3 / Initial given by admissions     [name] 04/30/2022, 10:47 AM

## 2022-04-30 NOTE — Progress Notes (Signed)
Pharmacy Antibiotic Note  Samantha Mason is a 71 y.o. female admitted on 04/26/2022 with infection of unknown source.  Pharmacy has been consulted for Cefepime & Vancomycin dosing for 7 days.  Plan: Cefepime 2 gm q24hr per indication & renal fxn.  Pt given Vancomycin 2500 mg once.  Pharmacy to variably dose due to unstable renal fxn.  Pharmacy will continue to follow and will adjust abx dosing whenever warranted.  Temp (24hrs), Avg:100.4 F (38 C), Min:98.1 F (36.7 C), Max:103 F (39.4 C)   Recent Labs  Lab 04/26/22 2010 04/27/22 0931 04/28/22 0606 04/29/22 0432 04/30/22 0002  WBC 17.2* 19.3* 14.1* 10.6* 15.3*  CREATININE 2.56* 3.31* 4.75* 5.17* 4.97*  LATICACIDVEN  --   --   --   --  1.6    Estimated Creatinine Clearance: 14.6 mL/min (A) (by C-G formula based on SCr of 4.97 mg/dL (H)).    Allergies  Allergen Reactions   Nuvigil [Armodafinil] Hives   Penicillins Diarrhea and Nausea And Vomiting    Did it involve swelling of the face/tongue/throat, SOB, or low BP? no Did it involve sudden or severe rash/hives, skin peeling, or any reaction on the inside of your mouth or nose? No Did you need to seek medical attention at a hospital or doctor's office? No When did it last happen?  in her 37s    If all above answers are "NO", may proceed with cephalosporin use.    Spironolactone Itching   Gabapentin Other (See Comments)    "wiped her out, couldn't stay awake"   Melatonin Other (See Comments)    "Brain fog"   Provigil [Modafinil] Hives   Entresto [Sacubitril-Valsartan] Itching   Lisinopril Cough    Antimicrobials this admission: 12/12 Cefepime >> x 7 days 12/12 Vancomycin >> x 7 days 12/12 Flagyl >> x 7 days  Microbiology results: 12/12 BCx: Pending  Thank you for allowing pharmacy to be a part of this patient's care.  Renda Rolls, PharmD, [facility] 04/30/2022 4:10 AM

## 2022-04-30 NOTE — Hospital Course (Addendum)
Taken from prior note.  Samantha Mason is a 71 y.o. female with medical history significant for diastolic CHF, and COPD with chronic respiratory failure on 2 L nasal cannula as well as obstructive sleep apnea on CPAP, stage IIIb chronic kidney disease, hypertension and morbid obesity who presents to the ED with a 3-day history of crampy upper abdominal pain and a 1 day history of nonbloody nonbilious vomiting.  She denies fever or chills, diarrhea or dysuria.  Denies chest pain or shortness of breath. ED course and data review: BP 160/83 with pulse of 103 and otherwise normal vitals.  Labs with WBC 15,200.  Sodium 130, creatinine2.29, up from baseline of 1.96.  Lipase and LFTs within normal limits.  CT abdomen and pelvis showing SBO as described below: IMPRESSION: 1. Small-bowel obstruction with transition zone in the ileum. Specific etiology is not identified. 2. Aortic atherosclerosis.  Patient was admitted for SBO, general surgery was consulted and they advised conservative management.  Patient continued to refuse NG tube placement.  Worsening renal function. Nephrology was also consulted  12/12: Overnight patient became febrile with altered mental status.  Sepsis workup and protocol initiated by overnight provider.  No obvious source of infection so far.  CT head was negative for any acute intracranial abnormality.  Labs with worsening leukocytosis, creatinine and procalcitonin at 1.99.  She received fluid boluses and started on broad-spectrum antibiotics which include cefepime, Flagyl and vancomycin. Discontinuing vancomycin due to worsening renal function and no current diagnosis of MRSA Switching cefepime and Flagyl with Zosyn. Blood cultures still pending. CT abdomen and pelvis ordered-still pending. Worsening renal function which continued to get worse despite getting some IV fluid. Might need starting of dialysis, concern of ATN.  Nephrology is on board.  12/13: Mild tachycardia and  low 100s, leukocytosis resolved, hemoglobin stable, renal function with a slight improvement of creatinine to 4.96 and GFR of 9 from 8.  Patient started getting BM and passing flatus, nausea and vomiting improved today. CT abdomen with concern of worsening SBO with transition point in the anterior mid abdomen, likely due to adhesion.  No other significant abnormality-awaiting surgery team response while restarting clear liquid diet.  12/14: Hemodynamically stable, renal functions continue to improve with creatinine of 3.8 today and GFR improved to 12.  Diet was advanced to full liquid.  Hypomagnesemia resolved.  Surgery would like to monitor for another day.  12/15: Patient again developed nausea and vomiting so she was made n.p.o. by surgery.  Repeat abdominal x-ray with persistent small bowel dilation consistent with obstruction.  Having bowel movements and passing flatus.  Surgery advised n.p.o. and TPN.  Patient currently refusing PICC line placement and TPN and would like to continue trying p.o. she was also refusing surgery saying that she does not want to take any risk. Difficult patient as she is refusing most of the medical care advised. Discussed with her son and he will try discussing with her as it is just prolonging her length of stay without any meaningful intervention or help. Renal functions continue to improve, creatinine at 3.18 with GFR improved to 15 today.  12/16: Hemodynamically stable.  Renal functions continue to improve with creatinine at 2.85, and GFR of 17.  Electrolytes within normal limit.  Agreed to proceed with PICC line for TPN.  She is having bowel movement but continued to have nausea.  She would like to wait and still not ready for any surgery.  12/17: Hemodynamically stable, renal function continued to improve  and appears to be at baseline now with creatinine of 2.31.  Nephrology is recommending continuation of LR at this time.  She was started on TPN.  Continue to  have bowel movements and passing flatus, nausea and vomiting improving.  Surgery will continue keeping her n.p.o. for bowel rest.  12/18: Patient clinically having bowel movements and feeling some improvement, case to be with persistently dilated bowel, she is still hesitant to proceed with surgery and would like to try a full liquid diet 1 more time before deciding.  Surgery advance her diet to full liquid. Will continue with TPN  12/19: Hemodynamically stable, mildly elevated blood pressure at 159/74.  Renal function continued to improve with creatinine at 1.69.  Mild hypophosphatemia which will be managed with TPN.  Currently tolerating full liquid diet.  12/20: Remained hemodynamically stable, having bowel movement with minimal abdominal pain and vomiting.  Surgery was scheduled for later today and she refused again. Surgery signed off .  Later also got report from anesthesia during preoperative evaluation for this surgery that she will be a very high risk for perioperative complications due to multiple comorbidities and morbid obesity and they were recommending going to a tertiary care center for any future surgical procedures.  Had a long discussion with patient that she need to go to a tertiary care center if her symptoms persist.  She will most likely need a surgical procedure to help with her symptoms as she has persistent due to persistent SBO which is most likely secondary to adhesions.  Patient seems understanding.  [facility] care was also consulted.  In the late afternoon received another message by nursing staff that patient now wants to proceed with surgery tomorrow.  Surgery has already signed off and will not be able to proceed with her surgical request due to no slot available in OR and her being very high risk for anesthesia.  We will try stopping TPN and if she continues to tolerate at least clear liquids she can be discharged home tomorrow so she can follow-up with a tertiary care  center later at her convenience.  12/21: TPN was discontinued and she was started on full liquid which she was having difficulty to tolerate and started nausea and vomiting along with abdominal pain.  Surgery tried transfer to [place] but unfortunately nobody is willing to accept her and there is also no bed availability.  Surgery signed off as they will not be able to help anymore.  Anesthesia is not comfortable to put her under for any procedure.  We had a lengthy discussion with patient and she has to go to [facility] if her conditions worsens. We will keep her on clear liquid and see how she does overnight before discharge.  12/22: Patient with some improvement in nausea and vomiting this morning, still mildly nauseated and able to tolerate Jell-O and some clear liquids.  Continue to have watery bowel movements and passing flatus.  Per patient it is more gas than BM. Slight worsening of renal function, holding torsemide today and we will resume at a lower dose from tomorrow.  Continue to have lower extremity edema.  Had another discussion with patient and she would like to be discharged so she can seek medical attention at the hospital of her choice.  Unfortunately we have tried but will not be able to help based on her comorbidities and BMI.  We tried transfer but remained unsuccessful.  She has been given Zofran, Phenergan and  pain medications and she will follow-up at [facility] for further assistance.  Patient need bariatric surgery department to help her.  She will continue on rest of home medications and will follow-up with her providers.

## 2022-04-30 NOTE — Progress Notes (Signed)
Progress Note   Patient: Samantha Mason PFX:[id number] DOB: 1950-07-30 DOA: 04/26/2022     4 DOS: the patient was seen and examined on 04/30/2022   Brief hospital course: Taken from prior note.  Samantha Mason is a 71 y.o. female with medical history significant for diastolic CHF, and COPD with chronic respiratory failure on 2 L nasal cannula as well as obstructive sleep apnea on CPAP, stage IIIb chronic kidney disease, hypertension and morbid obesity who presents to the ED with a 3-day history of crampy upper abdominal pain and a 1 day history of nonbloody nonbilious vomiting.  She denies fever or chills, diarrhea or dysuria.  Denies chest pain or shortness of breath. ED course and data review: BP 160/83 with pulse of 103 and otherwise normal vitals.  Labs with WBC 15,200.  Sodium 130, creatinine2.29, up from baseline of 1.96.  Lipase and LFTs within normal limits.  CT abdomen and pelvis showing SBO as described below: IMPRESSION: 1. Small-bowel obstruction with transition zone in the ileum. Specific etiology is not identified. 2. Aortic atherosclerosis.  Patient was admitted for SBO, general surgery was consulted and they advised conservative management.  Patient continued to refuse NG tube placement.  Worsening renal function. Nephrology was also consulted  12/12: Overnight patient became febrile with altered mental status.  Sepsis workup and protocol initiated by overnight provider.  No obvious source of infection so far.  CT head was negative for any acute intracranial abnormality.  Labs with worsening leukocytosis, creatinine and procalcitonin at 1.99.  She received fluid boluses and started on broad-spectrum antibiotics which include cefepime, Flagyl and vancomycin. Discontinuing vancomycin due to worsening renal function and no current diagnosis of MRSA Switching cefepime and Flagyl with Zosyn. Blood cultures still pending. CT abdomen and pelvis ordered-still pending. Worsening  renal function which continued to get worse despite getting some IV fluid. Might need starting of dialysis, concern of ATN.  Nephrology is on board      Assessment and Plan: * SBO (small bowel obstruction) ([place]) CT showing small bowel obstruction with transition zone in the ileum Patient repeatedly refused NG tube placement ordered by ED after an initial unsuccessful attempt General surgery was consulted and they were recommending conservative management. Patient continued to have some nausea and vomiting, she was started on clear liquid, not passing flatus or any bowel movement yet. Repeat CT abdomen pending IV hydration, antiemetics and pain control   AMS (altered mental status) Patient transiently became altered and febrile overnight.  Worsening leukocytosis. Chest x-ray negative for pneumonia or pulmonary edema. CT head negative for any acute intracranial abnormality. Procalcitonin elevated at 1.11.  UA with microscopic hematuria. Urine and blood cultures pending.  CT abdomen ordered-pending. She was given IV fluid and started on broad-spectrum antibiotics. -Switch antibiotics to Zosyn-we will further de-escalate once culture results are available, also pending CT abdomen and pelvis to rule out any intra-abdominal infection with current SBO. -Follow-up cultures  CKD (chronic kidney disease) stage 4, GFR 15-29 ml/min (HCC) AKI with CKD stage IV. Patient with worsening creatinine, currently at 5.37 with baseline around 2.29. Nephrology was also consulted.  No contrast exposure.  Concern of ATN. Might need to start dialysis. Patient also refused Foley catheter placement for urinary output measurement. -Monitor renal function -Avoid nephrotoxins  Chronic diastolic CHF (congestive heart failure) (HCC) Clinically euvolemic to dry Monitor for fluid overload in view of IV hydration with as needed IV Lasix Daily weights  Essential hypertension Blood pressure mildly  elevated Hydralazine  IV as needed for elevated blood pressure  -Home antihypertensives are being currently held due to AKI and SBO  Chronic respiratory failure with hypoxia and hypercapnia (HCC) O2 requirement at baseline Continue supplemental oxygen  Obstructive lung disease (generalized) ([place]) Not acutely exacerbated Continue Pulmicort twice daily with DuoNebs as needed  Morbid obesity with BMI of 50.0-59.9, adult ([place]) Estimated body mass index is 61.31 kg/m as calculated from the following:   Height as of this encounter: '5\' 1"'$  (1.549 m).   Weight as of this encounter: 409.8 kg.   -Complicating factor to overall prognosis and care  OSA  CPAP nightly   Subjective: Patient was seen and examined today.  Sitting in chair.  Continued to have nausea and vomiting.  Unable to tolerate much p.o.  No bowel movement or passing flatus.  Still refusing NG tube.  Physical Exam: Vitals:   04/30/22 0339 04/30/22 0801 04/30/22 1247 04/30/22 1538  BP: (!) 98/45 (!) 140/57 (!) 142/52 (!) 142/48  Pulse: (!) 119 (!) 102 (!) 105 100  Resp: (!) '23 20 20 14  '$ Temp: 100.3 F (37.9 C) 98.4 F (36.9 C) 98 F (36.7 C) 97.6 F (36.4 C)  TempSrc:  Oral Oral Oral  SpO2: 96% 99% 97% 93%  Weight:      Height:       General.  Morbidly obese lady, in no acute distress. Pulmonary.  Lungs clear bilaterally, normal respiratory effort. CV.  Regular rate and rhythm, no JVD, rub or murmur. Abdomen.  Soft, nontender, nondistended, BS positive. CNS.  Alert and oriented .  No focal neurologic deficit. Extremities.  1+ LE edema, no cyanosis, pulses intact and symmetrical. Psychiatry.  Judgment and insight appears normal.   Data Reviewed: Prior data reviewed  Family Communication: Discussed with son at bedside  Disposition: Status is: Inpatient Remains inpatient appropriate because: Severity of illness  Planned Discharge Destination:  To be determined  DVT prophylaxis.  Heparin subcu Time spent: 50  minutes  This record has been created using Systems analyst. Errors have been sought and corrected,but may not always be located. Such creation errors do not reflect on the standard of care.   Author: Lorella Nimrod, MD 04/30/2022 3:50 PM  For on call review [email or web address].

## 2022-04-30 NOTE — Plan of Care (Signed)

## 2022-04-30 NOTE — Progress Notes (Signed)
[facility]  ROUNDING NOTE   Subjective:   Samantha Mason is a 71 year old female with past medical history including COPD with renal failure requiring 2 L oxygen as needed, chronic diastolic heart failure, sleep apnea, morbid obesity, hypertension, and chronic kidney disease stage IV. Patient presents to the emergency department complaining of abdominal pain and has been admitted for Small bowel obstruction ([place]) [K56.609] SBO (small bowel obstruction) ([place]) [K56.609]  Patient is known to our practice and is followed by Dr Holley Raring outpatient. She was last seen in office on 04/17/22.  Patient seen sitting up in chair, family at bedside Alert and oriented No lower extremity edema Remains on room air Patient states she feels bad today, febrile overnight.   Objective:  Vital signs in last 24 hours:  Temp:  [98 F (36.7 C)-103 F (39.4 C)] 98 F (36.7 C) (12/12 1247) Pulse Rate:  [101-144] 105 (12/12 1247) Resp:  [18-29] 20 (12/12 1247) BP: (98-151)/(45-77) 142/52 (12/12 1247) SpO2:  [89 %-99 %] 97 % (12/12 1247)  Weight change:  Filed Weights   04/26/22 2014 04/27/22 0520 04/29/22 0650  Weight: (!) 148.8 kg (!) 148.8 kg (!) 147.2 kg    Intake/Output: I/O last 3 completed shifts: In: 3952.2 [P.O.:598; I.V.:3354.2] Out: 200 [Urine:200]   Intake/Output this shift:  Total I/O In: 1060 [P.O.:60; IV Piggyback:1000] Out: 450 [Emesis/NG output:450]  Physical Exam: General: NAD  Head: Normocephalic, atraumatic.  Dry oral mucosal membranes  Eyes: Anicteric  Lungs:  Clear to auscultation, normal effort, room air  Heart: Regular rate and rhythm  Abdomen:  Soft, nontender, obese  Extremities: No peripheral edema.  Neurologic: Alert and oriented, moving all four extremities  Skin: No lesions  Access: None    Basic Metabolic Panel: Recent Labs  Lab 04/27/22 0931 04/28/22 0606 04/29/22 0432 04/30/22 0002 04/30/22 0418  NA 129* 132* 132* 132* 133*  K 4.8  4.6 4.3 4.8 4.7  CL 89* 91* 92* 94* 96*  CO2 '31 29 27 26 25  '$ GLUCOSE 134* 113* 97 92 98  BUN 42* 63* 73* 74* 77*  CREATININE 3.31* 4.75* 5.17* 4.97* 5.37*  CALCIUM 9.2 9.0 9.0 9.1 8.9  MG 1.7 2.1 1.9  --  1.6*  PHOS 4.6 4.6 4.5  --  3.8     Liver Function Tests: Recent Labs  Lab 04/26/22 1328  AST 19  ALT 17  ALKPHOS 105  BILITOT 1.0  PROT 8.4*  ALBUMIN 3.6    Recent Labs  Lab 04/26/22 1328  LIPASE 31    No results for input(s): "AMMONIA" in the last 168 hours.  CBC: Recent Labs  Lab 04/27/22 0931 04/28/22 0606 04/29/22 0432 04/30/22 0002 04/30/22 0418  WBC 19.3* 14.1* 10.6* 15.3* 21.0*  HGB 11.6* 11.1* 10.6* 11.0* 10.0*  HCT 37.5 36.6 34.7* 35.2* 32.2*  MCV 79.4* 80.4 79.8* 78.9* 78.3*  PLT 367 361 350 357 357     Cardiac Enzymes: No results for input(s): "CKTOTAL", "CKMB", "CKMBINDEX", "TROPONINI" in the last 168 hours.  BNP: Invalid input(s): "POCBNP"  CBG: No results for input(s): "GLUCAP" in the last 168 hours.  Microbiology: Results for orders placed or performed during the hospital encounter of 01/23/22  Resp Panel by RT-PCR (Flu A&B, Covid) Anterior Nasal Swab     Status: None   Collection Time: 01/23/22  6:01 PM   Specimen: Anterior Nasal Swab  Result Value Ref Range Status   SARS Coronavirus 2 by RT PCR NEGATIVE NEGATIVE Final    Comment: (  NOTE) SARS-CoV-2 target nucleic acids are NOT DETECTED.  The SARS-CoV-2 RNA is generally detectable in upper respiratory specimens during the acute phase of infection. The lowest concentration of SARS-CoV-2 viral copies this assay can detect is 138 copies/mL. A negative result does not preclude SARS-Cov-2 infection and should not be used as the sole basis for treatment or other patient management decisions. A negative result may occur with  improper specimen collection/handling, submission of specimen other than nasopharyngeal swab, presence of viral mutation(s) within the areas targeted by this  assay, and inadequate number of viral copies(<138 copies/mL). A negative result must be combined with clinical observations, patient history, and epidemiological information. The expected result is Negative.  Fact Sheet for Patients:  EntrepreneurPulse.com.au  Fact Sheet for Healthcare Providers:  IncredibleEmployment.be  This test is no t yet approved or cleared by the [place] FDA and  has been authorized for detection and/or diagnosis of SARS-CoV-2 by FDA under an Emergency Use Authorization (EUA). This EUA will remain  in effect (meaning this test can be used) for the duration of the COVID-19 declaration under Section 564(b)(1) of the Act, 21 U.S.C.section 360bbb-3(b)(1), unless the authorization is terminated  or revoked sooner.       Influenza A by PCR NEGATIVE NEGATIVE Final   Influenza B by PCR NEGATIVE NEGATIVE Final    Comment: (NOTE) The Xpert Xpress SARS-CoV-2/FLU/RSV plus assay is intended as an aid in the diagnosis of influenza from Nasopharyngeal swab specimens and should not be used as a sole basis for treatment. Nasal washings and aspirates are unacceptable for Xpert Xpress SARS-CoV-2/FLU/RSV testing.  Fact Sheet for Patients: EntrepreneurPulse.com.au  Fact Sheet for Healthcare Providers: IncredibleEmployment.be  This test is not yet approved or cleared by the [place] FDA and has been authorized for detection and/or diagnosis of SARS-CoV-2 by FDA under an Emergency Use Authorization (EUA). This EUA will remain in effect (meaning this test can be used) for the duration of the COVID-19 declaration under Section 564(b)(1) of the Act, 21 U.S.C. section 360bbb-3(b)(1), unless the authorization is terminated or revoked.  Performed at [facility], [address] [place], [place] [address]     Coagulation Studies: No results for input(s): "LABPROT", "INR" in the last  72 hours.  Urinalysis: Recent Labs    04/30/22 1002  COLORURINE YELLOW*  LABSPEC 1.016  PHURINE 5.0  GLUCOSEU NEGATIVE  HGBUR LARGE*  BILIRUBINUR NEGATIVE  KETONESUR NEGATIVE  PROTEINUR NEGATIVE  NITRITE NEGATIVE  LEUKOCYTESUR NEGATIVE      Imaging: DG Chest Port 1 View  Result Date: 04/30/2022 CLINICAL DATA:  Sepsis EXAM: PORTABLE CHEST 1 VIEW COMPARISON:  01/23/2022 FINDINGS: Streaky density and right diaphragm elevation that is chronic based on priors. No edema or acute opacity. No effusion or pneumothorax. Normal heart size and mediastinal contours. IMPRESSION: Chronic elevation of the right diaphragm with overlying lower lobe scarring. No edema or convincing pneumonia when compared to priors. Electronically Signed   By: Jorje Guild M.D.   On: 04/30/2022 04:34   CT HEAD WO CONTRAST (5MM)  Result Date: 04/30/2022 CLINICAL DATA:  Altered mental status EXAM: CT HEAD WITHOUT CONTRAST TECHNIQUE: Contiguous axial images were obtained from the base of the skull through the vertex without intravenous contrast. RADIATION DOSE REDUCTION: This exam was performed according to the departmental dose-optimization program which includes automated exposure control, adjustment of the mA and/or kV according to patient size and/or use of iterative reconstruction technique. COMPARISON:  01/11/2019 FINDINGS: Brain: There is no mass, hemorrhage or  extra-axial collection. The size and configuration of the ventricles and extra-axial CSF spaces are normal. There is hypoattenuation of the white matter, most commonly indicating chronic small vessel disease. Vascular: No abnormal hyperdensity of the major intracranial arteries or dural venous sinuses. No intracranial atherosclerosis. Skull: The visualized skull base, calvarium and extracranial soft tissues are normal. Sinuses/Orbits: No fluid levels or advanced mucosal thickening of the visualized paranasal sinuses. No mastoid or middle ear effusion. The  orbits are normal. IMPRESSION: 1. No acute intracranial abnormality. 2. Chronic small vessel disease. Electronically Signed   By: Ulyses Jarred M.D.   On: 04/30/2022 01:48   DG Abd 1 View  Result Date: 04/29/2022 CLINICAL DATA:  Small bowel obstruction. EXAM: ABDOMEN - 1 VIEW COMPARISON:  April 27, 2022. FINDINGS: Continued small bowel dilatation is noted which may be slightly decreased compared to prior exam. Residual contrast is seen throughout the nondilated colon. IMPRESSION: Continued small bowel dilatation is noted which may be slightly decreased compared to prior exam, although residual contrast is noted throughout nondilated colon. Electronically Signed   By: Marijo Conception M.D.   On: 04/29/2022 08:11     Medications:    ceFEPime (MAXIPIME) IV 2 g (04/30/22 4010)   lactated ringers 125 mL/hr at 04/29/22 1755   metronidazole 500 mg (04/30/22 0617)   promethazine (PHENERGAN) injection (IM or IVPB)      budesonide  0.5 mg Nebulization BID   heparin injection (subcutaneous)  5,000 Units Subcutaneous Q8H   ibuprofen  200 mg Oral Once   ipratropium-albuterol  3 mL Nebulization Q6H   pantoprazole (PROTONIX) IV  40 mg Intravenous Q12H   vancomycin variable dose per unstable renal function (pharmacist dosing)   Does not apply See admin instructions   acetaminophen **OR** acetaminophen, hydrALAZINE, morphine injection, ondansetron **OR** ondansetron (ZOFRAN) IV, promethazine (PHENERGAN) injection (IM or IVPB)  Assessment/ Plan:  Ms. Samantha Mason is a 71 y.o.  female with past medical history including COPD with renal failure requiring 2 L oxygen as needed, chronic diastolic heart failure, sleep apnea, morbid obesity, hypertension, and chronic kidney disease stage IV. Patient presents to the emergency department complaining of shortness of breath and has been admitted for   Acute Kidney Injury on chronic kidney disease stage IV with baseline creatinine 2.01 and GFR of 26 on 01/29/22.   Acute kidney injury secondary to dehydration Chronic kidney disease is secondary to hypertension and CHF CT abd pelvis negative for obstruction. No IV contrast exposure.  Renal function continues to decline.  Discussed with patient the possible need for temporary dialysis but will monitor a.m. labs.  Lab Results  Component Value Date   CREATININE 5.37 (H) 04/30/2022   CREATININE 4.97 (H) 04/30/2022   CREATININE 5.17 (H) 04/29/2022    Intake/Output Summary (Last 24 hours) at 04/30/2022 1422 Last data filed at 04/30/2022 1407 Gross per 24 hour  Intake 2630.08 ml  Output 650 ml  Net 1980.08 ml    2. Anemia of chronic kidney disease Lab Results  Component Value Date   HGB 10.0 (L) 04/30/2022    Hgb within desired range  3.  Chronic diastolic heart failure.  Echo from 07/16/2021 shows EF 55 to 60% with mild LVH.  Fluid status currently maintained with metolazone 2.5 mg twice weekly.  All diuretics currently held.  4. Secondary Hyperparathyroidism: with outpatient labs: PTH 79, phosphorus 3.0, calcium 10.0 on 04/17/22.   Lab Results  Component Value Date   CALCIUM 8.9 04/30/2022   PHOS  3.8 04/30/2022    Calcium at goal. Will continue to monitor bone minerals.   LOS: 4   12/12/20232:22 PM

## 2022-04-30 NOTE — Progress Notes (Signed)
Subjective:  CC: Samantha Mason is a 71 y.o. female  Hospital stay day 4,   small bowel obstruction  HPI: Episode of tachycardia and recurrent nausea and emesis.  Current workup so far indicates no additional signs of infection.  Patient remains nauseated but no further episodes of emesis.  She is refusing to be transported to CT due to the nausea.  Still refusing NG.  Reports slight increase in abdominal pain.  ROS:  General: Denies weight loss, weight gain, fatigue, fevers, chills, and night sweats. Heart: Denies chest pain, palpitations, racing heart, irregular heartbeat, leg pain or swelling, and decreased activity tolerance. Respiratory: Denies breathing difficulty, shortness of breath, wheezing, cough, and sputum. GI: Denies change in appetite, heartburn, nausea, vomiting, constipation, diarrhea, and blood in stool. GU: Denies difficulty urinating, pain with urinating, urgency, frequency, blood in urine.   Objective:   Temp:  [97.6 F (36.4 C)-103 F (39.4 C)] 97.6 F (36.4 C) (12/12 1538) Pulse Rate:  [100-144] 100 (12/12 1538) Resp:  [14-29] 14 (12/12 1538) BP: (98-151)/(45-77) 142/48 (12/12 1538) SpO2:  [89 %-99 %] 93 % (12/12 1538)     Height: '5\' 1"'$  (154.9 cm) Weight: (!) 147.2 kg BMI (Calculated): 61.35   Intake/Output this shift:   Intake/Output Summary (Last 24 hours) at 04/30/2022 1713 Last data filed at 04/30/2022 1407 Gross per 24 hour  Intake 1178 ml  Output 450 ml  Net 728 ml    Constitutional :  alert, cooperative, appears stated age, and no distress  Respiratory:  clear to auscultation bilaterally  Cardiovascular:  regular rate and rhythm  Gastrointestinal: Soft, no guarding, tenderness to palpation in epigastric region unchanged from previous exam.. .   Skin: Cool and moist.   Psychiatric: Normal affect, non-agitated, not confused       LABS:     Latest Ref Rng & Units 04/30/2022    4:18 AM 04/30/2022   12:02 AM 04/29/2022    4:32 AM  CMP   Glucose 70 - 99 mg/dL 98  92  97   BUN 8 - 23 mg/dL 77  74  73   Creatinine 0.44 - 1.00 mg/dL 5.37  4.97  5.17   Sodium 135 - 145 mmol/L 133  132  132   Potassium 3.5 - 5.1 mmol/L 4.7  4.8  4.3   Chloride 98 - 111 mmol/L 96  94  92   CO2 22 - 32 mmol/L '25  26  27   '$ Calcium 8.9 - 10.3 mg/dL 8.9  9.1  9.0       Latest Ref Rng & Units 04/30/2022    4:18 AM 04/30/2022   12:02 AM 04/29/2022    4:32 AM  CBC  WBC 4.0 - 10.5 K/uL 21.0  15.3  10.6   Hemoglobin 12.0 - 15.0 g/dL 10.0  11.0  10.6   Hematocrit 36.0 - 46.0 % 32.2  35.2  34.7   Platelets 150 - 400 K/uL 357  357  350     RADS: CLINICAL DATA:  Sepsis   EXAM: PORTABLE CHEST 1 VIEW   COMPARISON:  01/23/2022   FINDINGS: Streaky density and right diaphragm elevation that is chronic based on priors. No edema or acute opacity. No effusion or pneumothorax. Normal heart size and mediastinal contours.   IMPRESSION: Chronic elevation of the right diaphragm with overlying lower lobe scarring. No edema or convincing pneumonia when compared to priors.     Electronically Signed   By: Gilford Silvius.D.  On: 04/30/2022 04:34 Assessment:   Failed clear liquid diet and changes noted above.  Chest x-ray upright did not indicate any sign of pneumoperitoneum.  Increasing leukocytosis and decreasing but still persistent increased heart rate concerning for possible abdominal etiology for acute change in status.  Despite explaining this and the concern for above, patient still refusing NG tube placement and/or further diagnostic imaging with CT scan.  She is extremely high risk to proceed with any sort of exploratory laparotomy at this point.  Fortunately clinical exam overall remains unchanged with focal tenderness to palpation epigastric area, no sign of peritonitis.  There is no free air on the upright chest x-ray and her lactic acid level was normal today.  Therefore, will like to wait for CT scan prior to any surgical intervention.   Will continue to monitor closely in the meantime for any worsening clinical status.  Again, explained all of the above to the patient but patient still refusing any further diagnostic studies or intervention at this time.  labs/images/medications/previous chart entries reviewed personally and relevant changes/updates noted above.

## 2022-04-30 NOTE — Assessment & Plan Note (Signed)
Patient transiently became altered and febrile overnight.  Worsening leukocytosis. Chest x-ray negative for pneumonia or pulmonary edema. CT head negative for any acute intracranial abnormality. Procalcitonin elevated at 1.11.  UA with microscopic hematuria. Urine and blood cultures pending.  CT abdomen ordered-pending. She was given IV fluid and started on broad-spectrum antibiotics. -Switch antibiotics to Zosyn-we will further de-escalate once culture results are available, also pending CT abdomen and pelvis to rule out any intra-abdominal infection with current SBO. -Follow-up cultures

## 2022-04-30 NOTE — Progress Notes (Addendum)
Patient refused to be transported to CT scan due to nausea and discomfort of feeling "tight" from fluids. RN administered patient's PRN zofran and PRN pain medication and asked for transport to come back at a later time. When Transport returned at the later time, the patient still refused due to discomfort. RN educated patient on the reason for the CT scan and why it is important . Patient still refused. MD notified. Will continue to monitor patient and address patient's needs.

## 2022-04-30 NOTE — TOC Initial Note (Signed)
Transition of Care [facility]) - Initial/Assessment Note    Patient Details  Name: Samantha Mason MRN: [id number] Date of Birth: 06/07/1950  [facility]) CM/SW Contact:    Beverly Sessions, RN Phone Number: 04/30/2022, 2:44 PM  Clinical Narrative:                  Admitted for SBO Admitted from: Home with son  PCP: [facility]ia  Current home health/prior home health/DME: [facility]  Patient states that she wears O2 as needed at home.  Patient states that she does have portable tanks at home and her son can bring one at discharge if needed  Patient with history of CHF and COPD.  Discussed option of home health nursing.  Patient is in agreement and states she would like to use the agency she had previously.   Referral made and accepted by Corene Cornea with [facility]         Patient Goals and CMS Choice        Expected Discharge Plan and Services                                                Prior Living Arrangements/Services                       Activities of Daily Living Home Assistive Devices/Equipment: None ADL Screening (condition at time of admission) Patient's cognitive ability adequate to safely complete daily activities?: Yes Is the patient deaf or have difficulty hearing?: No Does the patient have difficulty seeing, even when wearing glasses/contacts?: No Does the patient have difficulty concentrating, remembering, or making decisions?: No Patient able to express need for assistance with ADLs?: Yes Does the patient have difficulty dressing or bathing?: No Independently performs ADLs?: Yes (appropriate for developmental age) Does the patient have difficulty walking or climbing stairs?: Yes Weakness of Legs: None Weakness of Arms/Hands: None  Permission Sought/Granted                  Emotional Assessment              Admission diagnosis:  Small bowel obstruction ([place]) [K56.609] SBO (small bowel  obstruction) ([place]) [K56.609] Patient Active Problem List   Diagnosis Date Noted   SBO (small bowel obstruction) ([place]) 04/26/2022   Hypokalemia 01/26/2022   SOB (shortness of breath)    Extreme obesity with alveolar hypoventilation ([place])    CKD (chronic kidney disease) stage 4, GFR 15-29 ml/min ([place]) 01/23/2022   Chronic respiratory failure with hypoxia and hypercapnia ([place]) 10/15/2021   Acute on chronic heart failure with preserved ejection fraction (HFpEF) ([place]) 08/27/2021   Hyponatremia 07/26/2021   Insomnia 07/22/2021   Acute on chronic respiratory failure with hypoxia and hypercapnia ([place]) 07/15/2021   Chronic asthmatic bronchitis 07/15/2021   Morbid obesity ([place]) 07/15/2021   Myocardial injury 07/15/2021   Chronic diastolic CHF (congestive heart failure) ([place])    History of stroke 11/17/2020   Ovarian failure 02/21/2020   Primary osteoarthritis of right knee 02/13/2019   S/P total knee arthroplasty, left 02/13/2019   Aortic atherosclerosis ([place]) 08/01/2018   Obstructive lung disease (generalized) ([place]) 07/24/2018   B12 deficiency anemia 07/07/2018   Benign neoplasm of ascending colon    Severe persistent asthma 02/13/2018   Vitamin D deficiency 03/17/2017   Prediabetes 03/14/2017  Microcytic anemia 03/14/2017   Irritable bowel syndrome with diarrhea 03/12/2017   Allergic rhinitis 03/12/2017   Restless leg syndrome 03/12/2017   GERD (gastroesophageal reflux disease) 10/16/2016   Essential hypertension 10/16/2016   Morbid obesity with BMI of 50.0-59.9, adult ([place]) 12/08/2014   OSA  12/08/2014   Shortness of breath 04/07/2014   PCP:  Glean Hess, MD Pharmacy:   [id number] [place] [place] [address]Phone: [phone]: [phone] [id number] [place]: [phone]: [phone]   Social Determinants of Health (SDOH)  Interventions    Readmission Risk Interventions     No data to display

## 2022-05-01 ENCOUNTER — Inpatient Hospital Stay: Payer: Medicare Other

## 2022-05-01 DIAGNOSIS — K56609 Unspecified intestinal obstruction, unspecified as to partial versus complete obstruction: Secondary | ICD-10-CM | POA: Diagnosis not present

## 2022-05-01 LAB — CBC
HCT: 34.3 % — ABNORMAL LOW (ref 36.0–46.0)
Hemoglobin: 10.3 g/dL — ABNORMAL LOW (ref 12.0–15.0)
MCH: 24.1 pg — ABNORMAL LOW (ref 26.0–34.0)
MCHC: 30 g/dL (ref 30.0–36.0)
MCV: 80.1 fL (ref 80.0–100.0)
Platelets: 344 10*3/uL (ref 150–400)
RBC: 4.28 MIL/uL (ref 3.87–5.11)
RDW: 16.3 % — ABNORMAL HIGH (ref 11.5–15.5)
WBC: 8.6 10*3/uL (ref 4.0–10.5)
nRBC: 0 % (ref 0.0–0.2)

## 2022-05-01 LAB — BASIC METABOLIC PANEL
Anion gap: 14 (ref 5–15)
BUN: 81 mg/dL — ABNORMAL HIGH (ref 8–23)
CO2: 24 mmol/L (ref 22–32)
Calcium: 9.1 mg/dL (ref 8.9–10.3)
Chloride: 96 mmol/L — ABNORMAL LOW (ref 98–111)
Creatinine, Ser: 4.96 mg/dL — ABNORMAL HIGH (ref 0.44–1.00)
GFR, Estimated: 9 mL/min — ABNORMAL LOW (ref >60)
Glucose, Bld: 92 mg/dL (ref 70–99)
Potassium: 4.7 mmol/L (ref 3.5–5.1)
Sodium: 134 mmol/L — ABNORMAL LOW (ref 135–145)

## 2022-05-01 LAB — HEPATITIS B SURFACE ANTIGEN: Hepatitis B Surface Ag: NONREACTIVE

## 2022-05-01 MED ORDER — BOOST / RESOURCE BREEZE PO LIQD CUSTOM
1.0000 | Freq: Three times a day (TID) | ORAL | Status: DC
  Administered 2022-05-01: 1 via ORAL

## 2022-05-01 MED ORDER — IPRATROPIUM-ALBUTEROL 0.5-2.5 (3) MG/3ML IN SOLN
3.0000 mL | Freq: Two times a day (BID) | RESPIRATORY_TRACT | Status: DC
  Administered 2022-05-02 – 2022-05-14 (×25): 3 mL via RESPIRATORY_TRACT
  Filled 2022-05-01 (×25): qty 3

## 2022-05-01 NOTE — Assessment & Plan Note (Addendum)
AKI with CKD stage IV. Renal function started improving with creatinine currently at 3.18 with baseline around 2.29. Nephrology was also consulted.  No contrast exposure.  Concern of ATN. Patient also refused Foley catheter placement for urinary output measurement. -Nephrology would like to continue IV fluid -Monitor renal function -Avoid nephrotoxins

## 2022-05-01 NOTE — Assessment & Plan Note (Signed)
Blood pressure within goal Hydralazine IV as needed for elevated blood pressure  -Home antihypertensives are being currently held due to AKI and SBO

## 2022-05-01 NOTE — Progress Notes (Signed)
Subjective:  CC: Samantha Mason is a 71 y.o. female  Hospital stay day 5,   small bowel obstruction  HPI: Multiple bowel movements recorded.  Before and after CT scan.  Patient states she is feeling much better and tolerating clears for now.  ROS:  General: Denies weight loss, weight gain, fatigue, fevers, chills, and night sweats. Heart: Denies chest pain, palpitations, racing heart, irregular heartbeat, leg pain or swelling, and decreased activity tolerance. Respiratory: Denies breathing difficulty, shortness of breath, wheezing, cough, and sputum. GI: Denies change in appetite, heartburn, nausea, vomiting, constipation, diarrhea, and blood in stool. GU: Denies difficulty urinating, pain with urinating, urgency, frequency, blood in urine.   Objective:   Temp:  [97.4 F (36.3 C)-98.8 F (37.1 C)] 97.4 F (36.3 C) (12/13 1529) Pulse Rate:  [102-107] 102 (12/13 1529) Resp:  [17-20] 17 (12/13 1529) BP: (122-146)/(57-69) 122/57 (12/13 1529) SpO2:  [93 %-96 %] 96 % (12/13 1529)     Height: '5\' 1"'$  (154.9 cm) Weight: (!) 147.2 kg BMI (Calculated): 61.35   Intake/Output this shift:   Intake/Output Summary (Last 24 hours) at 05/01/2022 1733 Last data filed at 05/01/2022 1140 Gross per 24 hour  Intake 1366.46 ml  Output 750 ml  Net 616.46 ml    Constitutional :  alert, cooperative, appears stated age, and no distress  Respiratory:  clear to auscultation bilaterally  Cardiovascular:  regular rate and rhythm  Gastrointestinal: Soft, no guarding, tenderness to palpation in epigastric region much improved from previous exam.. .   Skin: Cool and moist.   Psychiatric: Normal affect, non-agitated, not confused       LABS:     Latest Ref Rng & Units 05/01/2022    2:06 AM 04/30/2022    4:18 AM 04/30/2022   12:02 AM  CMP  Glucose 70 - 99 mg/dL 92  98  92   BUN 8 - 23 mg/dL 81  77  74   Creatinine 0.44 - 1.00 mg/dL 4.96  5.37  4.97   Sodium 135 - 145 mmol/L 134  133  132    Potassium 3.5 - 5.1 mmol/L 4.7  4.7  4.8   Chloride 98 - 111 mmol/L 96  96  94   CO2 22 - 32 mmol/L '24  25  26   '$ Calcium 8.9 - 10.3 mg/dL 9.1  8.9  9.1       Latest Ref Rng & Units 05/01/2022    2:06 AM 04/30/2022    4:18 AM 04/30/2022   12:02 AM  CBC  WBC 4.0 - 10.5 K/uL 8.6  21.0  15.3   Hemoglobin 12.0 - 15.0 g/dL 10.3  10.0  11.0   Hematocrit 36.0 - 46.0 % 34.3  32.2  35.2   Platelets 150 - 400 K/uL 344  357  357     RADS: Narrative & Impression  CLINICAL DATA:  Abdominal pain.  Small-bowel obstruction.  Sepsis.   EXAM: CT ABDOMEN AND PELVIS WITHOUT CONTRAST   TECHNIQUE: Multidetector CT imaging of the abdomen and pelvis was performed following the standard protocol without IV contrast.   RADIATION DOSE REDUCTION: This exam was performed according to the departmental dose-optimization program which includes automated exposure control, adjustment of the mA and/or kV according to patient size and/or use of iterative reconstruction technique.   COMPARISON:  04/26/2022   FINDINGS: Lower chest: No acute findings.   Hepatobiliary: No mass visualized on this unenhanced exam. Prior cholecystectomy. No evidence of biliary obstruction.   Pancreas: No  mass or inflammatory process visualized on this unenhanced exam.   Spleen:  Within normal limits in size.   Adrenals/Urinary tract: No evidence of urolithiasis or hydronephrosis. Unremarkable unopacified urinary bladder.   Stomach/Bowel: Moderately dilated fluid-filled small bowel loops have increased since previous study. Transition point is seen in the anterior mid abdomen, likely due to adhesion. No mass or inflammatory process identified. No abnormal fluid collections seen.   Vascular/Lymphatic: No pathologically enlarged lymph nodes identified. No evidence of abdominal aortic aneurysm.   Reproductive: Stable 2 cm fat attenuation mass in the uterus, consistent with a benign lipoleiomyoma. Adnexal regions  are unremarkable.   Other:  None.   Musculoskeletal: No suspicious bone lesions identified. Incidental note is again made of Paget's disease involving the left hip.   IMPRESSION: Worsening small bowel obstruction with transition point in the anterior mid abdomen, likely due to adhesion.   No evidence of inflammatory process or abscess.   Stable small uterine lipoleiomyoma.     Assessment:   Continue clear liquids and monitor.  She is feeling as well as she has been since admission.  And clinical exam has much improved as well.  Still have a low threshold for surgery if she starts to become symptomatic again due to the CT scan ordered above.   labs/images/medications/previous chart entries reviewed personally and relevant changes/updates noted above.

## 2022-05-01 NOTE — Progress Notes (Signed)
LR ordered at 167m/hr was not running when entered pt room at beginning of shift. Nothing stated in report. [facility] Provider notified and instructed to not restart fluids and address with morning nurse.

## 2022-05-01 NOTE — Progress Notes (Signed)
[facility]  ROUNDING NOTE   Subjective:   Samantha Mason is a 71 year old female with past medical history including COPD with renal failure requiring 2 L oxygen as needed, chronic diastolic heart failure, sleep apnea, morbid obesity, hypertension, and chronic kidney disease stage IV. Patient presents to the emergency department complaining of abdominal pain and has been admitted for Small bowel obstruction ([place]) [K56.609] SBO (small bowel obstruction) ([place]) [K56.609]  Patient is known to our practice and is followed by Dr Holley Raring outpatient. She was last seen in office on 04/17/22.  Patient seen resting in bed Alert and oriented Reports edema in lower extremities Remains n.p.o. for bowel rest Reports nausea overnight  Objective:  Vital signs in last 24 hours:  Temp:  [97.6 F (36.4 C)-98.8 F (37.1 C)] 98.1 F (36.7 C) (12/13 0757) Pulse Rate:  [100-107] 107 (12/13 0757) Resp:  [14-20] 18 (12/13 0757) BP: (136-146)/(48-69) 146/69 (12/13 0757) SpO2:  [93 %-96 %] 96 % (12/13 0757)  Weight change:  Filed Weights   04/26/22 2014 04/27/22 0520 04/29/22 0650  Weight: (!) 148.8 kg (!) 148.8 kg (!) 147.2 kg    Intake/Output: I/O last 3 completed shifts: In: 2544.5 [P.O.:178; IV Piggyback:2366.5] Out: 750 [Urine:300; Emesis/NG output:450]   Intake/Output this shift:  Total I/O In: 0  Out: 450 [Urine:450]  Physical Exam: General: NAD  Head: Normocephalic, atraumatic.  Dry oral mucosal membranes  Eyes: Anicteric  Lungs:  Clear to auscultation, normal effort, room air  Heart: Regular rate and rhythm  Abdomen:  Soft, nontender, obese  Extremities: 1+ peripheral edema.  Neurologic: Alert and oriented, moving all four extremities  Skin: No lesions  Access: None    Basic Metabolic Panel: Recent Labs  Lab 04/27/22 0931 04/28/22 0606 04/29/22 0432 04/30/22 0002 04/30/22 0418 05/01/22 0206  NA 129* 132* 132* 132* 133* 134*  K 4.8 4.6 4.3 4.8 4.7 4.7   CL 89* 91* 92* 94* 96* 96*  CO2 '31 29 27 26 25 24  '$ GLUCOSE 134* 113* 97 92 98 92  BUN 42* 63* 73* 74* 77* 81*  CREATININE 3.31* 4.75* 5.17* 4.97* 5.37* 4.96*  CALCIUM 9.2 9.0 9.0 9.1 8.9 9.1  MG 1.7 2.1 1.9  --  1.6*  --   PHOS 4.6 4.6 4.5  --  3.8  --      Liver Function Tests: Recent Labs  Lab 04/26/22 1328  AST 19  ALT 17  ALKPHOS 105  BILITOT 1.0  PROT 8.4*  ALBUMIN 3.6    Recent Labs  Lab 04/26/22 1328  LIPASE 31    No results for input(s): "AMMONIA" in the last 168 hours.  CBC: Recent Labs  Lab 04/28/22 0606 04/29/22 0432 04/30/22 0002 04/30/22 0418 05/01/22 0206  WBC 14.1* 10.6* 15.3* 21.0* 8.6  HGB 11.1* 10.6* 11.0* 10.0* 10.3*  HCT 36.6 34.7* 35.2* 32.2* 34.3*  MCV 80.4 79.8* 78.9* 78.3* 80.1  PLT 361 350 357 357 344     Cardiac Enzymes: No results for input(s): "CKTOTAL", "CKMB", "CKMBINDEX", "TROPONINI" in the last 168 hours.  BNP: Invalid input(s): "POCBNP"  CBG: No results for input(s): "GLUCAP" in the last 168 hours.  Microbiology: Results for orders placed or performed during the hospital encounter of 04/26/22  Culture, blood (Routine X 2) w Reflex to ID Panel     Status: None (Preliminary result)   Collection Time: 04/30/22  4:18 AM   Specimen: BLOOD LEFT ARM  Result Value Ref Range Status   Specimen Description BLOOD  LEFT ARM  Final   Special Requests   Final    BOTTLES DRAWN AEROBIC AND ANAEROBIC Blood Culture adequate volume   Culture   Final    NO GROWTH 1 DAY Performed at [facility], [address] [place], [place] [address]    Report Status PENDING  Incomplete  Culture, blood (Routine X 2) w Reflex to ID Panel     Status: None (Preliminary result)   Collection Time: 04/30/22  5:04 AM   Specimen: BLOOD LEFT ARM  Result Value Ref Range Status   Specimen Description BLOOD LEFT ARM  Final   Special Requests   Final    BOTTLES DRAWN AEROBIC AND ANAEROBIC Blood Culture adequate volume   Culture   Final    NO  GROWTH 1 DAY Performed at [facility], [address], [place], [place] [address]    Report Status PENDING  Incomplete    Coagulation Studies: No results for input(s): "LABPROT", "INR" in the last 72 hours.  Urinalysis: Recent Labs    04/30/22 1002  COLORURINE YELLOW*  LABSPEC 1.016  PHURINE 5.0  GLUCOSEU NEGATIVE  HGBUR LARGE*  BILIRUBINUR NEGATIVE  KETONESUR NEGATIVE  PROTEINUR NEGATIVE  NITRITE NEGATIVE  LEUKOCYTESUR NEGATIVE       Imaging: DG Abd 1 View  Result Date: 05/01/2022 CLINICAL DATA:  Ileus, generalized abdominal pain. EXAM: ABDOMEN - 1 VIEW COMPARISON:  CT abdomen pelvis 05/01/2022 and abdominal radiographs 04/29/2022. FINDINGS: Persistent small bowel dilatation. Oral contrast is seen in nondilated colon, as on 05/01/2022. IMPRESSION: Small bowel obstruction. Electronically Signed   By: Lorin Picket M.D.   On: 05/01/2022 09:08   CT ABDOMEN PELVIS WO CONTRAST  Result Date: 05/01/2022 CLINICAL DATA:  Abdominal pain.  Small-bowel obstruction.  Sepsis. EXAM: CT ABDOMEN AND PELVIS WITHOUT CONTRAST TECHNIQUE: Multidetector CT imaging of the abdomen and pelvis was performed following the standard protocol without IV contrast. RADIATION DOSE REDUCTION: This exam was performed according to the departmental dose-optimization program which includes automated exposure control, adjustment of the mA and/or kV according to patient size and/or use of iterative reconstruction technique. COMPARISON:  04/26/2022 FINDINGS: Lower chest: No acute findings. Hepatobiliary: No mass visualized on this unenhanced exam. Prior cholecystectomy. No evidence of biliary obstruction. Pancreas: No mass or inflammatory process visualized on this unenhanced exam. Spleen:  Within normal limits in size. Adrenals/Urinary tract: No evidence of urolithiasis or hydronephrosis. Unremarkable unopacified urinary bladder. Stomach/Bowel: Moderately dilated fluid-filled small bowel loops have  increased since previous study. Transition point is seen in the anterior mid abdomen, likely due to adhesion. No mass or inflammatory process identified. No abnormal fluid collections seen. Vascular/Lymphatic: No pathologically enlarged lymph nodes identified. No evidence of abdominal aortic aneurysm. Reproductive: Stable 2 cm fat attenuation mass in the uterus, consistent with a benign lipoleiomyoma. Adnexal regions are unremarkable. Other:  None. Musculoskeletal: No suspicious bone lesions identified. Incidental note is again made of Paget's disease involving the left hip. IMPRESSION: Worsening small bowel obstruction with transition point in the anterior mid abdomen, likely due to adhesion. No evidence of inflammatory process or abscess. Stable small uterine lipoleiomyoma. Electronically Signed   By: Marlaine Hind M.D.   On: 05/01/2022 09:03   DG Chest Port 1 View  Result Date: 04/30/2022 CLINICAL DATA:  Sepsis EXAM: PORTABLE CHEST 1 VIEW COMPARISON:  01/23/2022 FINDINGS: Streaky density and right diaphragm elevation that is chronic based on priors. No edema or acute opacity. No effusion or pneumothorax. Normal heart size and mediastinal contours. IMPRESSION:  Chronic elevation of the right diaphragm with overlying lower lobe scarring. No edema or convincing pneumonia when compared to priors. Electronically Signed   By: Jorje Guild M.D.   On: 04/30/2022 04:34   CT HEAD WO CONTRAST (5MM)  Result Date: 04/30/2022 CLINICAL DATA:  Altered mental status EXAM: CT HEAD WITHOUT CONTRAST TECHNIQUE: Contiguous axial images were obtained from the base of the skull through the vertex without intravenous contrast. RADIATION DOSE REDUCTION: This exam was performed according to the departmental dose-optimization program which includes automated exposure control, adjustment of the mA and/or kV according to patient size and/or use of iterative reconstruction technique. COMPARISON:  01/11/2019 FINDINGS: Brain: There is  no mass, hemorrhage or extra-axial collection. The size and configuration of the ventricles and extra-axial CSF spaces are normal. There is hypoattenuation of the white matter, most commonly indicating chronic small vessel disease. Vascular: No abnormal hyperdensity of the major intracranial arteries or dural venous sinuses. No intracranial atherosclerosis. Skull: The visualized skull base, calvarium and extracranial soft tissues are normal. Sinuses/Orbits: No fluid levels or advanced mucosal thickening of the visualized paranasal sinuses. No mastoid or middle ear effusion. The orbits are normal. IMPRESSION: 1. No acute intracranial abnormality. 2. Chronic small vessel disease. Electronically Signed   By: Ulyses Jarred M.D.   On: 04/30/2022 01:48     Medications:    lactated ringers 125 mL/hr at 04/29/22 1755   piperacillin-tazobactam (ZOSYN)  IV 3.375 g (05/01/22 1419)   promethazine (PHENERGAN) injection (IM or IVPB) Stopped (04/30/22 2355)    budesonide  0.5 mg Nebulization BID   ipratropium-albuterol  3 mL Nebulization Q6H   pantoprazole (PROTONIX) IV  40 mg Intravenous Q12H   acetaminophen **OR** acetaminophen, hydrALAZINE, morphine injection, ondansetron **OR** ondansetron (ZOFRAN) IV, promethazine (PHENERGAN) injection (IM or IVPB)  Assessment/ Plan:  Ms. Samantha Mason is a 71 y.o.  female with past medical history including COPD with renal failure requiring 2 L oxygen as needed, chronic diastolic heart failure, sleep apnea, morbid obesity, hypertension, and chronic kidney disease stage IV. Patient presents to the emergency department complaining of shortness of breath and has been admitted for   Acute Kidney Injury on chronic kidney disease stage IV with baseline creatinine 2.01 and GFR of 26 on 01/29/22.  Acute kidney injury secondary to dehydration Chronic kidney disease is secondary to hypertension and CHF CT abd pelvis negative for obstruction. No IV contrast exposure.   Renal  function slightly improved today however "patient continues to have questionable urine output.  Encourage nursing to record urinary output to evaluate kidney function.  IV fluids currently stopped due to complaints of shortness of breath overnight.  Patient did receive a 2 L fluid bolus yesterday.  Would recommend low-dose IV fluids at 75 mL/h.  No acute need for dialysis at this time however monitoring closely.  Lab Results  Component Value Date   CREATININE 4.96 (H) 05/01/2022   CREATININE 5.37 (H) 04/30/2022   CREATININE 4.97 (H) 04/30/2022    Intake/Output Summary (Last 24 hours) at 05/01/2022 1514 Last data filed at 05/01/2022 1140 Gross per 24 hour  Intake 1366.46 ml  Output 750 ml  Net 616.46 ml    2. Anemia of chronic kidney disease Lab Results  Component Value Date   HGB 10.3 (L) 05/01/2022    Hemoglobin remains at goal.  Will continue to monitor.  3.  Chronic diastolic heart failure.  Echo from 07/16/2021 shows EF 55 to 60% with mild LVH.  Fluid status currently maintained  with metolazone 2.5 mg twice weekly.  All diuretics currently held.  Patient received 2 L fluid bolus and continuous fluids stopped due to shortness of breath.  4. Secondary Hyperparathyroidism: with outpatient labs: PTH 79, phosphorus 3.0, calcium 10.0 on 04/17/22.   Lab Results  Component Value Date   CALCIUM 9.1 05/01/2022   PHOS 3.8 04/30/2022    Will continue to monitor bone minerals during this admission.   LOS: [place] 12/13/20233:14 PM

## 2022-05-01 NOTE — Progress Notes (Signed)
Progress Note   Patient: Samantha Mason ATF:[id number] DOB: 09-10-1950 DOA: 04/26/2022     5 DOS: the patient was seen and examined on 05/01/2022   Brief hospital course: Taken from prior note.  MANUEL LAWHEAD is a 71 y.o. female with medical history significant for diastolic CHF, and COPD with chronic respiratory failure on 2 L nasal cannula as well as obstructive sleep apnea on CPAP, stage IIIb chronic kidney disease, hypertension and morbid obesity who presents to the ED with a 3-day history of crampy upper abdominal pain and a 1 day history of nonbloody nonbilious vomiting.  She denies fever or chills, diarrhea or dysuria.  Denies chest pain or shortness of breath. ED course and data review: BP 160/83 with pulse of 103 and otherwise normal vitals.  Labs with WBC 15,200.  Sodium 130, creatinine2.29, up from baseline of 1.96.  Lipase and LFTs within normal limits.  CT abdomen and pelvis showing SBO as described below: IMPRESSION: 1. Small-bowel obstruction with transition zone in the ileum. Specific etiology is not identified. 2. Aortic atherosclerosis.  Patient was admitted for SBO, general surgery was consulted and they advised conservative management.  Patient continued to refuse NG tube placement.  Worsening renal function. Nephrology was also consulted  12/12: Overnight patient became febrile with altered mental status.  Sepsis workup and protocol initiated by overnight provider.  No obvious source of infection so far.  CT head was negative for any acute intracranial abnormality.  Labs with worsening leukocytosis, creatinine and procalcitonin at 1.99.  She received fluid boluses and started on broad-spectrum antibiotics which include cefepime, Flagyl and vancomycin. Discontinuing vancomycin due to worsening renal function and no current diagnosis of MRSA Switching cefepime and Flagyl with Zosyn. Blood cultures still pending. CT abdomen and pelvis ordered-still pending. Worsening  renal function which continued to get worse despite getting some IV fluid. Might need starting of dialysis, concern of ATN.  Nephrology is on board.  12/13: Mild tachycardia and low 100s, leukocytosis resolved, hemoglobin stable, renal function with a slight improvement of creatinine to 4.96 and GFR of 9 from 8.  Patient started getting BM and passing flatus, nausea and vomiting improved today. CT abdomen with concern of worsening SBO with transition point in the anterior mid abdomen, likely due to adhesion.  No other significant abnormality-awaiting surgery team response while restarting clear liquid diet.      Assessment and Plan: * SBO (small bowel obstruction) (HCC) CT showing small bowel obstruction with transition zone in the ileum Patient repeatedly refused NG tube placement ordered by ED after an initial unsuccessful attempt General surgery was consulted and they were recommending conservative management. Clinically seems improving after having couple of bowel movements and passing flatus, nausea and vomiting also improving but CT abdomen with worsening of SBO and transition point, concern of adhesion. -Start her on clear liquid -Awaiting surgical team response for further management and comment on CT -Continue with IV hydration, antiemetics and pain control   AMS (altered mental status) Resolved Patient transiently became altered and febrile overnight.  Worsening leukocytosis, has been resolved Chest x-ray negative for pneumonia or pulmonary edema. CT head negative for any acute intracranial abnormality. Procalcitonin elevated at 1.11.  UA with microscopic hematuria. Urine cultures pending and blood cultures negative so far.  CT abdomen with worsening of SBO and no other acute abnormality.  She was given IV fluid and started on broad-spectrum antibiotics. -Continue Zosyn-we will further de-escalate once culture results are available,  -Follow-up cultures  CKD (chronic kidney  disease) stage 4, GFR 15-29 ml/min (HCC) AKI with CKD stage IV. Patient with slight improvement in creatinine currently at 4.76 with baseline around 2.29. Nephrology was also consulted.  No contrast exposure.  Concern of ATN. Might need to start dialysis. Patient also refused Foley catheter placement for urinary output measurement. -Nephrology would like to continue IV fluid -Monitor renal function -Avoid nephrotoxins  Chronic diastolic CHF (congestive heart failure) (HCC) Clinically euvolemic to dry Monitor for fluid overload in view of IV hydration with as needed IV Lasix Daily weights  Essential hypertension Blood pressure within goal Hydralazine IV as needed for elevated blood pressure  -Home antihypertensives are being currently held due to AKI and SBO  Chronic respiratory failure with hypoxia and hypercapnia (HCC) O2 requirement at baseline Continue supplemental oxygen  Obstructive lung disease (generalized) ([place]) Not acutely exacerbated Continue Pulmicort twice daily with DuoNebs as needed  Morbid obesity with BMI of 50.0-59.9, adult (HCC) Estimated body mass index is 61.31 kg/m as calculated from the following:   Height as of this encounter: '5\' 1"'$  (1.549 m).   Weight as of this encounter: 622.2 kg.   -Complicating factor to overall prognosis and care  OSA  CPAP nightly   Subjective: Patient was feeling little improved today.  Did not had any more nausea or vomiting this morning.  Had 2 bowel movements and passing flatus.  Physical Exam: Vitals:   04/30/22 2153 05/01/22 0346 05/01/22 0757 05/01/22 1529  BP: 136/63 (!) 142/60 (!) 146/69 (!) 122/57  Pulse: (!) 104 (!) 103 (!) 107 (!) 102  Resp: '19 20 18 17  '$ Temp: 98.2 F (36.8 C) 98.8 F (37.1 C) 98.1 F (36.7 C) (!) 97.4 F (36.3 C)  TempSrc: Oral     SpO2: 95% 94% 96% 96%  Weight:      Height:       General.  Morbidly obese lady, in no acute distress. Pulmonary.  Lungs clear bilaterally, normal  respiratory effort. CV.  Regular rate and rhythm, no JVD, rub or murmur. Abdomen.  Soft, nontender, nondistended, BS positive. CNS.  Alert and oriented .  No focal neurologic deficit. Extremities.  1+ LE edema, no cyanosis, pulses intact and symmetrical. Psychiatry.  Judgment and insight appears normal.   Data Reviewed: Prior data reviewed  Family Communication:   Disposition: Status is: Inpatient Remains inpatient appropriate because: Severity of illness  Planned Discharge Destination: Home with home health  DVT prophylaxis.  Heparin subcu Time spent: 45 minutes  This record has been created using Systems analyst. Errors have been sought and corrected,but may not always be located. Such creation errors do not reflect on the standard of care.   Author: Lorella Nimrod, MD 05/01/2022 4:00 PM  For on call review [email or web address].

## 2022-05-01 NOTE — Assessment & Plan Note (Addendum)
CT showing small bowel obstruction with transition zone in the ileum Patient repeatedly refused NG tube placement ordered by ED after an initial unsuccessful attempt General surgery was consulted and they were recommending conservative management initially and then advising surgery as she did not improve after many days and repeat imaging with persistent bowel obstruction. Repeat CT abdomen with worsening of SBO and transition point, concern of adhesion.  Unable to tolerate full liquid diet so she was made n.p.o. again. Repeat imaging with persistent obstruction, patient would like to try a full liquid diet before thinking about surgery.  So diet was switched to full liquid. -TPN was discontinued -Surgery signed off today.

## 2022-05-01 NOTE — Assessment & Plan Note (Addendum)
Resolved Patient transiently became altered and febrile overnight.  Worsening leukocytosis, has been resolved Chest x-ray negative for pneumonia or pulmonary edema. CT head negative for any acute intracranial abnormality. Procalcitonin elevated at 1.11.  UA with microscopic hematuria. Urine cultures with Enterococcus faecalis and blood cultures negative so far.  CT abdomen with worsening of SBO and no other acute abnormality.  She was given IV fluid and started on broad-spectrum antibiotics. -Continue Zosyn--completed total of 7 days course

## 2022-05-01 NOTE — Progress Notes (Signed)
Initial Nutrition Assessment  DOCUMENTATION CODES:   Morbid obesity  INTERVENTION:   Boost Breeze po TID, each supplement provides 250 kcal and 9 grams of protein  MVI po daily   Pt at high refeed risk; recommend monitor potassium, magnesium and phosphorus labs daily until stable  Recommend TPN if unable to advance pt's diet in the next 24 hrs.   Daily weights  NUTRITION DIAGNOSIS:   Inadequate oral intake related to acute illness as evidenced by NPO status.  GOAL:   Patient will meet greater than or equal to 90% of their needs  MONITOR:   PO intake, Supplement acceptance, Diet advancement, Labs, Weight trends, Skin, I & O's  REASON FOR ASSESSMENT:   Malnutrition Screening Tool    ASSESSMENT:   71 y/o female with h/o morbid obesity, OSA, HTN, COPD, CHF, CKD IV, GERD, IBS and RLS who is admitted with SBO.  Met with pt in room today. Pt in good spirits and is sitting up in chair at time of RD visit. 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 Pt reports good appetite and oral intake at baseline and up until the day of her admission. Per chart, pt appears weight stable at baseline. Pt has remained on NPO/clear liquid diet since admission and is now without adequate nutrition for 6 days. Discussed with pt the importance of adequate nutrition needed to preserve lean muscle and to support post op healing in the event that she has to have surgery. Plan for now is for medical management. Pt is willing to drink supplements in hospital. Recommend TPN if unable to advance pt's diet in the next 24 hrs. Pt is at high refeed risk. TPN discussed with pt today, pt is willing to receive TPN if needed. CT scan from today reporting worsening small bowel obstruction. Pt refuses NGT.    Medications reviewed and include: protonix, LRS _0 /hr, zosyn   Labs reviewed: Na 134(L), K 4.7 wnl P 3.8 wnl, Mg 1.6(L)- 12/12 Hgb 10.3(L), Hct 34.3(L)  NUTRITION -  FOCUSED PHYSICAL EXAM:  Flowsheet Row Most Recent Value  Orbital Region No depletion  Upper Arm Region No depletion  Thoracic and Lumbar Region No depletion  Buccal Region No depletion  Temple Region No depletion  Clavicle Bone Region No depletion  Clavicle and Acromion Bone Region No depletion  Scapular Bone Region No depletion  Dorsal Hand No depletion  Patellar Region No depletion  Anterior Thigh Region No depletion  Posterior Calf Region No depletion  Edema (RD Assessment) Moderate  Hair Reviewed  Eyes Reviewed  Mouth Reviewed  Skin Reviewed  Nails Reviewed   Diet Order:   Diet Order             Diet clear liquid Room service appropriate? Yes; Fluid consistency: Thin  Diet effective now                  EDUCATION NEEDS:   Education needs have been addressed  Skin:  Skin Assessment: Reviewed RN Assessment  Last BM:  12/13- TYPE 6  Height:   Ht Readings from Last 1 Encounters:  04/27/22 _1  (1.549 m)    Weight:   Wt Readings from Last 1 Encounters:  04/29/22 (!) 147.2 kg    Ideal Body Weight:  47.7 kg  BMI:  Body mass index is 61.31 kg/m.  Estimated Nutritional Needs:   Kcal:  2300-2600kcal/day  Protein:  115-130g/day  Fluid:  1.4-1.6L/day  Myriam Jacobson  Leeza Heiner MS, RD, LDN Please refer to AMION for RD and/or RD on-call/weekend/after hours pager  

## 2022-05-02 DIAGNOSIS — K56609 Unspecified intestinal obstruction, unspecified as to partial versus complete obstruction: Secondary | ICD-10-CM | POA: Diagnosis not present

## 2022-05-02 LAB — BASIC METABOLIC PANEL
Anion gap: 11 (ref 5–15)
BUN: 83 mg/dL — ABNORMAL HIGH (ref 8–23)
CO2: 25 mmol/L (ref 22–32)
Calcium: 8.7 mg/dL — ABNORMAL LOW (ref 8.9–10.3)
Chloride: 98 mmol/L (ref 98–111)
Creatinine, Ser: 3.8 mg/dL — ABNORMAL HIGH (ref 0.44–1.00)
GFR, Estimated: 12 mL/min — ABNORMAL LOW (ref >60)
Glucose, Bld: 101 mg/dL — ABNORMAL HIGH (ref 70–99)
Potassium: 4.1 mmol/L (ref 3.5–5.1)
Sodium: 134 mmol/L — ABNORMAL LOW (ref 135–145)

## 2022-05-02 LAB — CBC
HCT: 30.6 % — ABNORMAL LOW (ref 36.0–46.0)
Hemoglobin: 9.6 g/dL — ABNORMAL LOW (ref 12.0–15.0)
MCH: 24.6 pg — ABNORMAL LOW (ref 26.0–34.0)
MCHC: 31.4 g/dL (ref 30.0–36.0)
MCV: 78.3 fL — ABNORMAL LOW (ref 80.0–100.0)
Platelets: 315 10*3/uL (ref 150–400)
RBC: 3.91 MIL/uL (ref 3.87–5.11)
RDW: 16.3 % — ABNORMAL HIGH (ref 11.5–15.5)
WBC: 7.6 10*3/uL (ref 4.0–10.5)
nRBC: 0 % (ref 0.0–0.2)

## 2022-05-02 LAB — HEPATITIS B SURFACE ANTIBODY, QUANTITATIVE: Hep B S AB Quant (Post): <3.1 m[IU]/mL — ABNORMAL LOW (ref >9.9)

## 2022-05-02 LAB — HEPATITIS B E ANTIBODY: Hep B E Ab: NEGATIVE

## 2022-05-02 LAB — URINE CULTURE: Culture: 40000 — AB

## 2022-05-02 LAB — PHOSPHORUS: Phosphorus: 3.2 mg/dL (ref 2.5–4.6)

## 2022-05-02 LAB — MAGNESIUM: Magnesium: 1.9 mg/dL (ref 1.7–2.4)

## 2022-05-02 MED ORDER — ADULT MULTIVITAMIN W/MINERALS CH
1.0000 | ORAL_TABLET | Freq: Every day | ORAL | Status: DC
  Administered 2022-05-05: 1 via ORAL
  Filled 2022-05-02 (×3): qty 1

## 2022-05-02 MED ORDER — BLISTEX MEDICATED EX OINT
TOPICAL_OINTMENT | CUTANEOUS | Status: DC | PRN
  Administered 2022-05-03 (×2): 1 via TOPICAL
  Filled 2022-05-02: qty 6.3

## 2022-05-02 MED ORDER — PIPERACILLIN-TAZOBACTAM IN DEX 2-0.25 GM/50ML IV SOLN
2.2500 g | Freq: Three times a day (TID) | INTRAVENOUS | Status: DC
  Administered 2022-05-02 – 2022-05-03 (×2): 2.25 g via INTRAVENOUS
  Filled 2022-05-02 (×5): qty 50

## 2022-05-02 MED ORDER — ENSURE ENLIVE PO LIQD: 237.0000 mL | Freq: Three times a day (TID) | ORAL | Status: DC

## 2022-05-02 NOTE — Progress Notes (Signed)
PHARMACY NOTE:  ANTIMICROBIAL RENAL DOSAGE ADJUSTMENT  Current antimicrobial regimen includes a mismatch between antimicrobial dosage and estimated renal function.  As per policy approved by the Pharmacy & Therapeutics and Medical Executive Committees, the antimicrobial dosage will be adjusted accordingly.  Current antimicrobial dosage: Zosyn (piperacillin/tazobactam) 3.375 grams every 8 hours (4 hour infusion)  Indication: SBO with altered mental status and leukocytosis  Renal Function: CrCl 19.1 mL/min (no renal replacement therapy)  Estimated Creatinine Clearance: 19.1 mL/min (A) (by C-G formula based on SCr of 3.8 mg/dL (H)). '[]'$      On intermittent HD, scheduled: '[]'$      On CRRT    Antimicrobial dosage has been changed to:  Zosyn (piperacillin/tazobactam) 2.25 grams every 8 hours (30 minute infusion)  Additional comments:   Thank you for allowing pharmacy to be a part of this patient's care.    Glean Salvo, PharmD, BCPS Clinical Pharmacist  05/02/2022 7:29 AM

## 2022-05-02 NOTE — Progress Notes (Signed)
Progress Note   Patient: Samantha Mason BSJ:[id number] DOB: 04-07-1951 DOA: 04/26/2022     6 DOS: the patient was seen and examined on 05/02/2022   Brief hospital course: Taken from prior note.  Samantha Mason is a 71 y.o. female with medical history significant for diastolic CHF, and COPD with chronic respiratory failure on 2 L nasal cannula as well as obstructive sleep apnea on CPAP, stage IIIb chronic kidney disease, hypertension and morbid obesity who presents to the ED with a 3-day history of crampy upper abdominal pain and a 1 day history of nonbloody nonbilious vomiting.  She denies fever or chills, diarrhea or dysuria.  Denies chest pain or shortness of breath. ED course and data review: BP 160/83 with pulse of 103 and otherwise normal vitals.  Labs with WBC 15,200.  Sodium 130, creatinine2.29, up from baseline of 1.96.  Lipase and LFTs within normal limits.  CT abdomen and pelvis showing SBO as described below: IMPRESSION: 1. Small-bowel obstruction with transition zone in the ileum. Specific etiology is not identified. 2. Aortic atherosclerosis.  Patient was admitted for SBO, general surgery was consulted and they advised conservative management.  Patient continued to refuse NG tube placement.  Worsening renal function. Nephrology was also consulted  12/12: Overnight patient became febrile with altered mental status.  Sepsis workup and protocol initiated by overnight provider.  No obvious source of infection so far.  CT head was negative for any acute intracranial abnormality.  Labs with worsening leukocytosis, creatinine and procalcitonin at 1.99.  She received fluid boluses and started on broad-spectrum antibiotics which include cefepime, Flagyl and vancomycin. Discontinuing vancomycin due to worsening renal function and no current diagnosis of MRSA Switching cefepime and Flagyl with Zosyn. Blood cultures still pending. CT abdomen and pelvis ordered-still pending. Worsening  renal function which continued to get worse despite getting some IV fluid. Might need starting of dialysis, concern of ATN.  Nephrology is on board.  12/13: Mild tachycardia and low 100s, leukocytosis resolved, hemoglobin stable, renal function with a slight improvement of creatinine to 4.96 and GFR of 9 from 8.  Patient started getting BM and passing flatus, nausea and vomiting improved today. CT abdomen with concern of worsening SBO with transition point in the anterior mid abdomen, likely due to adhesion.  No other significant abnormality-awaiting surgery team response while restarting clear liquid diet.  12/14: Hemodynamically stable, renal functions continue to improve with creatinine of 3.8 today and GFR improved to 12.  Diet was advanced to full liquid.  Hypomagnesemia resolved.  Surgery would like to monitor for another day.   Assessment and Plan: * SBO (small bowel obstruction) (HCC) CT showing small bowel obstruction with transition zone in the ileum Patient repeatedly refused NG tube placement ordered by ED after an initial unsuccessful attempt General surgery was consulted and they were recommending conservative management. Clinically seems improving after having couple of bowel movements and passing flatus, nausea and vomiting also improving but CT abdomen with worsening of SBO and transition point, concern of adhesion. -Diet advanced to full liquid -Surgery would like to monitor for another day -Continue with IV hydration, antiemetics and pain control   AMS (altered mental status) Resolved Patient transiently became altered and febrile overnight.  Worsening leukocytosis, has been resolved Chest x-ray negative for pneumonia or pulmonary edema. CT head negative for any acute intracranial abnormality. Procalcitonin elevated at 1.11.  UA with microscopic hematuria. Urine cultures pending and blood cultures negative so far.  CT abdomen with worsening  of SBO and no other acute  abnormality.  She was given IV fluid and started on broad-spectrum antibiotics. -Continue Zosyn-we will further de-escalate once culture results are available,  -Follow-up cultures  CKD (chronic kidney disease) stage 4, GFR 15-29 ml/min (HCC) AKI with CKD stage IV. Renal function started improving with creatinine currently at 3.80 with baseline around 2.29. Nephrology was also consulted.  No contrast exposure.  Concern of ATN. Might need to start dialysis. Patient also refused Foley catheter placement for urinary output measurement. -Nephrology would like to continue IV fluid -Monitor renal function -Avoid nephrotoxins  Chronic diastolic CHF (congestive heart failure) (HCC) Clinically euvolemic to dry Monitor for fluid overload in view of IV hydration with as needed IV Lasix Daily weights  Essential hypertension Blood pressure within goal Hydralazine IV as needed for elevated blood pressure  -Home antihypertensives are being currently held due to AKI and SBO  Chronic respiratory failure with hypoxia and hypercapnia (HCC) O2 requirement at baseline Continue supplemental oxygen  Obstructive lung disease (generalized) ([place]) Not acutely exacerbated Continue Pulmicort twice daily with DuoNebs as needed  Morbid obesity with BMI of 50.0-59.9, adult (HCC) Estimated body mass index is 61.31 kg/m as calculated from the following:   Height as of this encounter: '5\' 1"'$  (1.549 m).   Weight as of this encounter: 222.9 kg.   -Complicating factor to overall prognosis and care  OSA  CPAP nightly   Subjective: Patient was sitting comfortably in chair when seen today.  Tolerating full liquid now.  Had 3 bowel movement overnight.  Passing flatus, having mild nausea but no vomiting.  Physical Exam: Vitals:   05/01/22 2014 05/02/22 0318 05/02/22 0808 05/02/22 1548  BP: (!) 127/51 (!) 140/63 (!) 127/59 (!) 107/49  Pulse: 95 97 81 92  Resp: '16 20 18 19  '$ Temp: 97.6 F (36.4 C) 98.2 F  (36.8 C) 97.8 F (36.6 C) (!) 97.5 F (36.4 C)  TempSrc: Oral Oral Oral Oral  SpO2: 94% 99% 99% 94%  Weight:      Height:       General.  Morbidly obese lady, in no acute distress. Pulmonary.  Lungs clear bilaterally, normal respiratory effort. CV.  Regular rate and rhythm, no JVD, rub or murmur. Abdomen.  Soft, nontender, nondistended, BS positive. CNS.  Alert and oriented .  No focal neurologic deficit. Extremities.  1+ slightly edema, no cyanosis, pulses intact and symmetrical. Psychiatry.  Judgment and insight appears normal.   Data Reviewed: Prior data reviewed  Family Communication:   Disposition: Status is: Inpatient Remains inpatient appropriate because: Severity of illness  Planned Discharge Destination: Home with home health  DVT prophylaxis.  Heparin subcu Time spent: 40 minutes  This record has been created using Systems analyst. Errors have been sought and corrected,but may not always be located. Such creation errors do not reflect on the standard of care.   Author: Lorella Nimrod, MD 05/02/2022 4:20 PM  For on call review [email or web address].

## 2022-05-02 NOTE — Progress Notes (Signed)
Subjective:  CC: Samantha Mason is a 71 y.o. female  Hospital stay day 6,   small bowel obstruction  HPI: Continues to feel well on clear liquid diet.  Multiple bowel movements reported by patient again this a.m.  ROS:  General: Denies weight loss, weight gain, fatigue, fevers, chills, and night sweats. Heart: Denies chest pain, palpitations, racing heart, irregular heartbeat, leg pain or swelling, and decreased activity tolerance. Respiratory: Denies breathing difficulty, shortness of breath, wheezing, cough, and sputum. GI: Denies change in appetite, heartburn, nausea, vomiting, constipation, diarrhea, and blood in stool. GU: Denies difficulty urinating, pain with urinating, urgency, frequency, blood in urine.   Objective:   Temp:  [97.5 F (36.4 C)-98.2 F (36.8 C)] 97.5 F (36.4 C) (12/14 1548) Pulse Rate:  [81-97] 92 (12/14 1548) Resp:  [16-20] 19 (12/14 1548) BP: (107-140)/(49-63) 107/49 (12/14 1548) SpO2:  [92 %-99 %] 94 % (12/14 1548)     Height: '5\' 1"'$  (154.9 cm) Weight: (!) 147.2 kg BMI (Calculated): 61.35   Intake/Output this shift:   Intake/Output Summary (Last 24 hours) at 05/02/2022 1610 Last data filed at 05/02/2022 1356 Gross per 24 hour  Intake 713.54 ml  Output 500 ml  Net 213.54 ml    Constitutional :  alert, cooperative, appears stated age, and no distress  Respiratory:  clear to auscultation bilaterally  Cardiovascular:  regular rate and rhythm  Gastrointestinal: Soft, no guarding, tenderness to palpation in epigastric region continued improvement from previous exam.. .   Skin: Cool and moist.   Psychiatric: Normal affect, non-agitated, not confused       LABS:     Latest Ref Rng & Units 05/02/2022    5:59 AM 05/01/2022    2:06 AM 04/30/2022    4:18 AM  CMP  Glucose 70 - 99 mg/dL 101  92  98   BUN 8 - 23 mg/dL 83  81  77   Creatinine 0.44 - 1.00 mg/dL 3.80  4.96  5.37   Sodium 135 - 145 mmol/L 134  134  133   Potassium 3.5 - 5.1 mmol/L 4.1   4.7  4.7   Chloride 98 - 111 mmol/L 98  96  96   CO2 22 - 32 mmol/L '25  24  25   '$ Calcium 8.9 - 10.3 mg/dL 8.7  9.1  8.9       Latest Ref Rng & Units 05/02/2022    5:59 AM 05/01/2022    2:06 AM 04/30/2022    4:18 AM  CBC  WBC 4.0 - 10.5 K/uL 7.6  8.6  21.0   Hemoglobin 12.0 - 15.0 g/dL 9.6  10.3  10.0   Hematocrit 36.0 - 46.0 % 30.6  34.3  32.2   Platelets 150 - 400 K/uL 315  344  357     RADS: N/a  Assessment:   Continued clinical improvement resuming clear liquid diet.  Advancing to full liquid diet today and monitor.  labs/images/medications/previous chart entries reviewed personally and relevant changes/updates noted above.

## 2022-05-02 NOTE — Progress Notes (Signed)
[facility]  ROUNDING NOTE   Subjective:   Samantha Mason is a 71 year old female with past medical history including COPD with renal failure requiring 2 L oxygen as needed, chronic diastolic heart failure, sleep apnea, morbid obesity, hypertension, and chronic kidney disease stage IV. Patient presents to the emergency department complaining of abdominal pain and has been admitted for Small bowel obstruction ([place]) [K56.609] SBO (small bowel obstruction) ([place]) [K56.609]  Patient is known to our practice and is followed by Dr Holley Raring outpatient. She was last seen in office on 04/17/22.  Patient seen sitting up in chair Reports improved fluid intake  Denies nausea Denies shortness of breath Mild lower extremity edema remains  Objective:  Vital signs in last 24 hours:  Temp:  [97.5 F (36.4 C)-98.2 F (36.8 C)] 97.5 F (36.4 C) (12/14 1548) Pulse Rate:  [81-97] 92 (12/14 1548) Resp:  [16-20] 19 (12/14 1548) BP: (107-140)/(49-63) 107/49 (12/14 1548) SpO2:  [92 %-99 %] 94 % (12/14 1548)  Weight change:  Filed Weights   04/26/22 2014 04/27/22 0520 04/29/22 0650  Weight: (!) 148.8 kg (!) 148.8 kg (!) 147.2 kg    Intake/Output: I/O last 3 completed shifts: In: 2080 [P.O.:480; IV Piggyback:1600] Out: 750 [Urine:750]   Intake/Output this shift:  Total I/O In: -  Out: 500 [Urine:500]  Physical Exam: General: NAD  Head: Normocephalic, atraumatic.  Dry oral mucosal membranes  Eyes: Anicteric  Lungs:  Clear to auscultation, normal effort, room air  Heart: Regular rate and rhythm  Abdomen:  Soft, nontender, obese  Extremities: 1+ peripheral edema.  Neurologic: Alert and oriented, moving all four extremities  Skin: No lesions  Access: None    Basic Metabolic Panel: Recent Labs  Lab 04/27/22 0931 04/28/22 0606 04/29/22 0432 04/30/22 0002 04/30/22 0418 05/01/22 0206 05/02/22 0559  NA 129* 132* 132* 132* 133* 134* 134*  K 4.8 4.6 4.3 4.8 4.7 4.7 4.1  CL  89* 91* 92* 94* 96* 96* 98  CO2 '31 29 27 26 25 24 25  '$ GLUCOSE 134* 113* 97 92 98 92 101*  BUN 42* 63* 73* 74* 77* 81* 83*  CREATININE 3.31* 4.75* 5.17* 4.97* 5.37* 4.96* 3.80*  CALCIUM 9.2 9.0 9.0 9.1 8.9 9.1 8.7*  MG 1.7 2.1 1.9  --  1.6*  --  1.9  PHOS 4.6 4.6 4.5  --  3.8  --  3.2     Liver Function Tests: Recent Labs  Lab 04/26/22 1328  AST 19  ALT 17  ALKPHOS 105  BILITOT 1.0  PROT 8.4*  ALBUMIN 3.6    Recent Labs  Lab 04/26/22 1328  LIPASE 31    No results for input(s): "AMMONIA" in the last 168 hours.  CBC: Recent Labs  Lab 04/29/22 0432 04/30/22 0002 04/30/22 0418 05/01/22 0206 05/02/22 0559  WBC 10.6* 15.3* 21.0* 8.6 7.6  HGB 10.6* 11.0* 10.0* 10.3* 9.6*  HCT 34.7* 35.2* 32.2* 34.3* 30.6*  MCV 79.8* 78.9* 78.3* 80.1 78.3*  PLT 350 357 357 344 315     Cardiac Enzymes: No results for input(s): "CKTOTAL", "CKMB", "CKMBINDEX", "TROPONINI" in the last 168 hours.  BNP: Invalid input(s): "POCBNP"  CBG: No results for input(s): "GLUCAP" in the last 168 hours.  Microbiology: Results for orders placed or performed during the hospital encounter of 04/26/22  Culture, blood (Routine X 2) w Reflex to ID Panel     Status: None (Preliminary result)   Collection Time: 04/30/22  4:18 AM   Specimen: BLOOD LEFT ARM  Result Value Ref Range Status   Specimen Description BLOOD LEFT ARM  Final   Special Requests   Final    BOTTLES DRAWN AEROBIC AND ANAEROBIC Blood Culture adequate volume   Culture   Final    NO GROWTH 2 DAYS Performed at [facility], [address], [place], [place] [address]    Report Status PENDING  Incomplete  Culture, blood (Routine X 2) w Reflex to ID Panel     Status: None (Preliminary result)   Collection Time: 04/30/22  5:04 AM   Specimen: BLOOD LEFT ARM  Result Value Ref Range Status   Specimen Description BLOOD LEFT ARM  Final   Special Requests   Final    BOTTLES DRAWN AEROBIC AND ANAEROBIC Blood Culture adequate  volume   Culture   Final    NO GROWTH 2 DAYS Performed at [facility], [address] [place], [place] [address]    Report Status PENDING  Incomplete  Urine Culture     Status: Abnormal   Collection Time: 04/30/22 10:02 AM   Specimen: Urine, Clean Catch  Result Value Ref Range Status   Specimen Description   Final    URINE, CLEAN CATCH Performed at [facility], [address], [place], [place] [address]    Special Requests   Final    NONE Performed at [facility], [place], [place] [address]    Culture 40,000 COLONIES/mL ENTEROCOCCUS FAECIUM (A)  Final   Report Status 05/02/2022 FINAL  Final   Organism ID, Bacteria ENTEROCOCCUS FAECIUM (A)  Final      Susceptibility   Enterococcus faecium - MIC*    AMPICILLIN <=2 SENSITIVE Sensitive     NITROFURANTOIN 64 INTERMEDIATE Intermediate     VANCOMYCIN <=0.5 SENSITIVE Sensitive     * 40,000 COLONIES/mL ENTEROCOCCUS FAECIUM    Coagulation Studies: No results for input(s): "LABPROT", "INR" in the last 72 hours.  Urinalysis: Recent Labs    04/30/22 1002  COLORURINE YELLOW*  LABSPEC 1.016  PHURINE 5.0  GLUCOSEU NEGATIVE  HGBUR LARGE*  BILIRUBINUR NEGATIVE  KETONESUR NEGATIVE  PROTEINUR NEGATIVE  NITRITE NEGATIVE  LEUKOCYTESUR NEGATIVE       Imaging: DG Abd 1 View  Result Date: 05/01/2022 CLINICAL DATA:  Ileus, generalized abdominal pain. EXAM: ABDOMEN - 1 VIEW COMPARISON:  CT abdomen pelvis 05/01/2022 and abdominal radiographs 04/29/2022. FINDINGS: Persistent small bowel dilatation. Oral contrast is seen in nondilated colon, as on 05/01/2022. IMPRESSION: Small bowel obstruction. Electronically Signed   By: Lorin Picket M.D.   On: 05/01/2022 09:08   CT ABDOMEN PELVIS WO CONTRAST  Result Date: 05/01/2022 CLINICAL DATA:  Abdominal pain.  Small-bowel obstruction.  Sepsis. EXAM: CT ABDOMEN AND PELVIS WITHOUT CONTRAST TECHNIQUE: Multidetector CT imaging of the abdomen and  pelvis was performed following the standard protocol without IV contrast. RADIATION DOSE REDUCTION: This exam was performed according to the departmental dose-optimization program which includes automated exposure control, adjustment of the mA and/or kV according to patient size and/or use of iterative reconstruction technique. COMPARISON:  04/26/2022 FINDINGS: Lower chest: No acute findings. Hepatobiliary: No mass visualized on this unenhanced exam. Prior cholecystectomy. No evidence of biliary obstruction. Pancreas: No mass or inflammatory process visualized on this unenhanced exam. Spleen:  Within normal limits in size. Adrenals/Urinary tract: No evidence of urolithiasis or hydronephrosis. Unremarkable unopacified urinary bladder. Stomach/Bowel: Moderately dilated fluid-filled small bowel loops have increased since previous study. Transition point is seen in the anterior mid abdomen, likely due  to adhesion. No mass or inflammatory process identified. No abnormal fluid collections seen. Vascular/Lymphatic: No pathologically enlarged lymph nodes identified. No evidence of abdominal aortic aneurysm. Reproductive: Stable 2 cm fat attenuation mass in the uterus, consistent with a benign lipoleiomyoma. Adnexal regions are unremarkable. Other:  None. Musculoskeletal: No suspicious bone lesions identified. Incidental note is again made of Paget's disease involving the left hip. IMPRESSION: Worsening small bowel obstruction with transition point in the anterior mid abdomen, likely due to adhesion. No evidence of inflammatory process or abscess. Stable small uterine lipoleiomyoma. Electronically Signed   By: Marlaine Hind M.D.   On: 05/01/2022 09:03     Medications:    lactated ringers 75 mL/hr at 05/02/22 1028   piperacillin-tazobactam (ZOSYN)  IV     promethazine (PHENERGAN) injection (IM or IVPB) 12.5 mg (05/02/22 1005)    budesonide  0.5 mg Nebulization BID   feeding supplement  237 mL Oral TID BM    ipratropium-albuterol  3 mL Nebulization BID   [START ON 05/03/2022] multivitamin with minerals  1 tablet Oral Daily   pantoprazole (PROTONIX) IV  40 mg Intravenous Q12H   acetaminophen **OR** acetaminophen, hydrALAZINE, morphine injection, ondansetron **OR** ondansetron (ZOFRAN) IV, promethazine (PHENERGAN) injection (IM or IVPB)  Assessment/ Plan:  Samantha Mason is a 71 y.o.  female with past medical history including COPD with renal failure requiring 2 L oxygen as needed, chronic diastolic heart failure, sleep apnea, morbid obesity, hypertension, and chronic kidney disease stage IV. Patient presents to the emergency department complaining of shortness of breath and has been admitted for   Acute Kidney Injury on chronic kidney disease stage IV with baseline creatinine 2.01 and GFR of 26 on 01/29/22.  Acute kidney injury secondary to dehydration Chronic kidney disease is secondary to hypertension and CHF CT abd pelvis negative for obstruction. No IV contrast exposure.   Renal function improved today, 4.96-3.80.  Patient reports adequate urine output.  No acute need for dialysis.  Patient reports adequate fluid intake, will continue to hold IV fluids.  Lab Results  Component Value Date   CREATININE 3.80 (H) 05/02/2022   CREATININE 4.96 (H) 05/01/2022   CREATININE 5.37 (H) 04/30/2022    Intake/Output Summary (Last 24 hours) at 05/02/2022 1605 Last data filed at 05/02/2022 1356 Gross per 24 hour  Intake 713.54 ml  Output 500 ml  Net 213.54 ml    2. Anemia of chronic kidney disease Lab Results  Component Value Date   HGB 9.6 (L) 05/02/2022    Hemoglobin remains at goal.  Will continue to monitor.  3.  Chronic diastolic heart failure.  Echo from 07/16/2021 shows EF 55 to 60% with mild LVH.  Fluid status currently maintained with metolazone 2.5 mg twice weekly.  All diuretics currently held.  Fluid status remains stable.  Will continue to hold diuretics.  4. Secondary  Hyperparathyroidism: with outpatient labs: PTH 79, phosphorus 3.0, calcium 10.0 on 04/17/22.   Lab Results  Component Value Date   CALCIUM 8.7 (L) 05/02/2022   PHOS 3.2 05/02/2022    Calcium stable.   LOS: 6   12/14/20234:05 PM

## 2022-05-03 ENCOUNTER — Inpatient Hospital Stay: Payer: Medicare Other

## 2022-05-03 ENCOUNTER — Inpatient Hospital Stay: Payer: Self-pay

## 2022-05-03 DIAGNOSIS — K56609 Unspecified intestinal obstruction, unspecified as to partial versus complete obstruction: Secondary | ICD-10-CM | POA: Diagnosis not present

## 2022-05-03 LAB — BASIC METABOLIC PANEL
Anion gap: 11 (ref 5–15)
BUN: 71 mg/dL — ABNORMAL HIGH (ref 8–23)
CO2: 24 mmol/L (ref 22–32)
Calcium: 8.8 mg/dL — ABNORMAL LOW (ref 8.9–10.3)
Chloride: 98 mmol/L (ref 98–111)
Creatinine, Ser: 3.18 mg/dL — ABNORMAL HIGH (ref 0.44–1.00)
GFR, Estimated: 15 mL/min — ABNORMAL LOW (ref >60)
Glucose, Bld: 101 mg/dL — ABNORMAL HIGH (ref 70–99)
Potassium: 4.2 mmol/L (ref 3.5–5.1)
Sodium: 133 mmol/L — ABNORMAL LOW (ref 135–145)

## 2022-05-03 LAB — PHOSPHORUS: Phosphorus: 3.4 mg/dL (ref 2.5–4.6)

## 2022-05-03 LAB — MAGNESIUM: Magnesium: 1.9 mg/dL (ref 1.7–2.4)

## 2022-05-03 LAB — GLUCOSE, CAPILLARY
Glucose-Capillary: 92 mg/dL (ref 70–99)
Glucose-Capillary: 91 mg/dL (ref 70–99)
Glucose-Capillary: 93 mg/dL (ref 70–99)

## 2022-05-03 MED ORDER — TRACE MINERALS CU-MN-SE-ZN 300-55-60-3000 MCG/ML IV SOLN
INTRAVENOUS | Status: AC
  Filled 2022-05-03: qty 448

## 2022-05-03 MED ORDER — INSULIN ASPART 100 UNIT/ML IJ SOLN
0.0000 [IU] | INTRAMUSCULAR | Status: DC
  Administered 2022-05-06: 1 [IU] via SUBCUTANEOUS
  Filled 2022-05-03 (×2): qty 1

## 2022-05-03 MED ORDER — SODIUM CHLORIDE 0.9 % IV SOLN
12.5000 mg | Freq: Four times a day (QID) | INTRAVENOUS | Status: DC | PRN
  Administered 2022-05-04 – 2022-05-11 (×6): 12.5 mg via INTRAVENOUS
  Filled 2022-05-03 (×3): qty 12.5
  Filled 2022-05-03 (×4): qty 0.5

## 2022-05-03 MED ORDER — PIPERACILLIN-TAZOBACTAM 3.375 G IVPB
3.3750 g | Freq: Three times a day (TID) | INTRAVENOUS | Status: AC
  Administered 2022-05-03 – 2022-05-06 (×11): 3.375 g via INTRAVENOUS
  Filled 2022-05-03 (×11): qty 50

## 2022-05-03 NOTE — Care Management Important Message (Signed)
Important Message  Patient Details  Name: Samantha Mason MRN: [id number] Date of Birth: 1951/02/19   Medicare Important Message Given:  Yes     [name] 05/03/2022, 11:54 AM

## 2022-05-03 NOTE — Plan of Care (Signed)
  Problem: Education: Goal: Knowledge of General Education information will improve Description: Including pain rating scale, medication(s)/side effects and non-pharmacologic comfort measures Outcome: Progressing   Problem: Health Behavior/Discharge Planning: Goal: Ability to manage health-related needs will improve Outcome: Progressing   Problem: Clinical Measurements: Goal: Ability to maintain clinical measurements within normal limits will improve Outcome: Progressing Goal: Will remain free from infection Outcome: Progressing Goal: Diagnostic test results will improve Outcome: Progressing Goal: Respiratory complications will improve Outcome: Progressing Goal: Cardiovascular complication will be avoided Outcome: Progressing   Problem: Activity: Goal: Risk for activity intolerance will decrease Outcome: Progressing   Problem: Nutrition: Goal: Adequate nutrition will be maintained Outcome: Progressing   Problem: Coping: Goal: Level of anxiety will decrease Outcome: Progressing   Problem: Elimination: Goal: Will not experience complications related to bowel motility Outcome: Progressing Goal: Will not experience complications related to urinary retention Outcome: Progressing   Problem: Safety: Goal: Ability to remain free from injury will improve Outcome: Progressing   Problem: Skin Integrity: Goal: Risk for impaired skin integrity will decrease Outcome: Progressing   Problem: Fluid Volume: Goal: Hemodynamic stability will improve Outcome: Progressing   Problem: Clinical Measurements: Goal: Diagnostic test results will improve Outcome: Progressing Goal: Signs and symptoms of infection will decrease Outcome: Progressing   Problem: Respiratory: Goal: Ability to maintain adequate ventilation will improve Outcome: Progressing   

## 2022-05-03 NOTE — Telephone Encounter (Signed)
The patient is still in the hospital. Will check on her status again next week.

## 2022-05-03 NOTE — Progress Notes (Deleted)
PHARMACIST - PHYSICIAN COMMUNICATION  CONCERNING: Antibiotic IV to Oral Route Change Policy  RECOMMENDATION: This patient is receiving Zosyn (piperacillin/tazobactam) by the intravenous route.  Based on criteria approved by the Pharmacy and Therapeutics Committee, the antibiotic is being converted to the equivalent oral dose form.   DESCRIPTION: These criteria include: Patient being treated for a respiratory tract infection, urinary tract infection, cellulitis or clostridium difficile associated diarrhea if on metronidazole The patient is not neutropenic and does not exhibit a GI malabsorption state The patient is eating (either orally or via tube) and/or has been taking other orally administered medications for a least 24 hours The patient is improving clinically and has a Tmax < 100.5   Glean Salvo, PharmD, BCPS Clinical Pharmacist  05/03/2022 10:00 AM

## 2022-05-03 NOTE — Progress Notes (Signed)
Nutrition Follow-up  DOCUMENTATION CODES:   Morbid obesity  INTERVENTION:   -D/c Ensure Enlive due to NPO status -TPN management per pharmacy -Daily weights   NUTRITION DIAGNOSIS:   Inadequate oral intake related to acute illness as evidenced by NPO status.  Ongoing  GOAL:   Patient will meet greater than or equal to 90% of their needs  Progressing   MONITOR:   PO intake, Supplement acceptance, Diet advancement, Labs, Weight trends, Skin, I & O's  REASON FOR ASSESSMENT:   Malnutrition Screening Tool    ASSESSMENT:   71 y/o female with h/o morbid obesity, OSA, HTN, COPD, CHF, CKD IV, GERD, IBS and RLS who is admitted with SBO.  12/13- advanced to clear liquid diet 12/14- advanced full liquid diet 12/15- NPO, start TPN, place PICC line  Reviewed I/O's: -1 L x 24 hours and +5.2 L since admission  UOP: 1 L x 24 hours   Case discussed with Dr. Lysle Clarence; plan is to keep pt NPO to continue to decompress her bowel over the weekend. Plan to to continue to decompress her bowels to prepare her for surgery, however, pt is hesitant to puruse surgery at this time. Pt with minimal tolerance of full liquids. Dr. Lysle Kiyana agreeable to start TPN due to prolonged NPO status.   Case discussed with pharmacist, notifying of plan to start TPN. Per pharmacy plan to start TPN at 40 ml/hr on 1800, which provides 1268 kcals and 67 grams protein, meeting 55% of estimated kcal needs and 58% of estimated protein needs.   Labs reviewed: Na: 133, CBGS: 151.   Diet Order:   Diet Order             Diet NPO time specified Except for: Sips with Meds  Diet effective now                   EDUCATION NEEDS:   Education needs have been addressed  Skin:  Skin Assessment: Reviewed RN Assessment  Last BM:  05/03/22 (type 7)  Height:   Ht Readings from Last 1 Encounters:  04/27/22 '5\' 1"'$  (1.549 m)    Weight:   Wt Readings from Last 1 Encounters:  05/03/22 (!) 151.1 kg    Ideal Body  Weight:  47.7 kg  BMI:  Body mass index is 62.94 kg/m.  Estimated Nutritional Needs:   Kcal:  2300-2600kcal/day  Protein:  115-130g/day  Fluid:  1.4-1.6L/day    Loistine Chance, RD, LDN, [name] Registered Dietitian II Certified Diabetes Care and Education Specialist Please refer to [facility] for RD and/or RD on-call/weekend/after hours pager

## 2022-05-03 NOTE — Progress Notes (Signed)
PHARMACY - TOTAL PARENTERAL NUTRITION CONSULT NOTE   Indication:  small bowel obstruction  Patient Measurements: Height: '5\' 1"'$  (154.9 cm) Weight: (!) 151.1 kg (333 lb 1.8 oz) IBW/kg (Calculated) : 47.8 TPN AdjBW (KG): 73 Body mass index is 62.94 kg/m. Usual Weight: 151 kg  Assessment: 71 year old female with PMH dCHF, COPD on 2L PTA, OSA on CPAP, CKD3b, HTN, and morbid obesity admitted with small bowel obstruction unable to tolerate diet. Hospital day 5. Surgery planning for possible exploratory laparatomy. Planning for TPN while awaiting surgery and need for decompressed bowel prior to possible surgery.  Glucose / Insulin: CBGs < 180 Electrolytes: Na 134. Lytes WNL. Renal: Scr trending down: 3.380 on 12/15 Hepatic: ordering for tomorrow AM Intake / Output; MIVF:  GI Imaging: GI Surgeries / Procedures:    Central access:  TPN start date:   Nutritional Goals: Goal TPN rate is 80 mL/hr (provides 130 g of protein and 2600 kcals per day)  Kcal:  2300-2600kcal/day Protein: 115-130g/day Fluid:  1.4-1.6L/day  Nutritional components at goal rate: Clinisol: 70 g/L Dextrose: 18% Smof lipids: 45 g/L   RD Assessment: Estimated Needs Total Energy Estimated Needs: 2300-2600kcal/day Total Protein Estimated Needs: 115-130g/day Total Fluid Estimated Needs: 1.4-1.6L/day  Current Nutrition:  NPO  Plan: Start TPN at 40 mL/hr at 1800 Electrolytes in TPN: Na 80mq/L, K 597m/L, Ca 81m66mL, Mg 81mE24m, and Phos 181mm4m. Cl:Ac 1:1 Add standard MVI and trace elements to TPN Initiate very Sensitive q4h SSI and adjust as needed  Reduce MIVF to 50 mL/hr at 1800 Monitor TPN labs on Mon/Thurs, and as needed.    [name] [name], BCPS Clinical Pharmacist  05/03/2022 9:54 AM

## 2022-05-03 NOTE — Progress Notes (Addendum)
[facility]  ROUNDING NOTE   Subjective:   Samantha Mason is a 71 year old female with past medical history including COPD with renal failure requiring 2 L oxygen as needed, chronic diastolic heart failure, sleep apnea, morbid obesity, hypertension, and chronic kidney disease stage IV. Patient presents to the emergency department complaining of abdominal pain and has been admitted for Small bowel obstruction ([place]) [K56.609] SBO (small bowel obstruction) ([place]) [K56.609]  Patient is known to our practice and is followed by Dr Holley Raring outpatient. She was last seen in office on 04/17/22.  Patient seen resting in bed Reports vomiting this morning when being moved from 1 bed to another States she was able to tolerate fluids yesterday. Patient seen later in day, now has diarrhea  UOP 1L   Objective:  Vital signs in last 24 hours:  Temp:  [97.4 F (36.3 C)-98.2 F (36.8 C)] 98.2 F (36.8 C) (12/15 0851) Pulse Rate:  [91-102] 102 (12/15 0851) Resp:  [18-20] 18 (12/15 0851) BP: (107-133)/(49-65) 124/50 (12/15 0851) SpO2:  [92 %-97 %] 92 % (12/15 0851) Weight:  [151.1 kg] 151.1 kg (12/15 0404)  Weight change:  Filed Weights   04/27/22 0520 04/29/22 0650 05/03/22 0404  Weight: (!) 148.8 kg (!) 147.2 kg (!) 151.1 kg    Intake/Output: I/O last 3 completed shifts: In: 233.5 [IV Piggyback:233.5] Out: 1000 [Urine:1000]   Intake/Output this shift:  No intake/output data recorded.  Physical Exam: General: NAD  Head: Normocephalic, atraumatic.  Dry oral mucosal membranes  Eyes: Anicteric  Lungs:  Clear to auscultation, normal effort, room air  Heart: Regular rate and rhythm  Abdomen:  Soft, nontender, obese  Extremities: 1+ peripheral edema.  Neurologic: Alert and oriented, moving all four extremities  Skin: No lesions  Access: None    Basic Metabolic Panel: Recent Labs  Lab 04/28/22 0606 04/29/22 0432 04/30/22 0002 04/30/22 0418 05/01/22 0206 05/02/22 0559  05/03/22 0631  NA 132* 132* 132* 133* 134* 134* 133*  K 4.6 4.3 4.8 4.7 4.7 4.1 4.2  CL 91* 92* 94* 96* 96* 98 98  CO2 '29 27 26 25 24 25 24  '$ GLUCOSE 113* 97 92 98 92 101* 101*  BUN 63* 73* 74* 77* 81* 83* 71*  CREATININE 4.75* 5.17* 4.97* 5.37* 4.96* 3.80* 3.18*  CALCIUM 9.0 9.0 9.1 8.9 9.1 8.7* 8.8*  MG 2.1 1.9  --  1.6*  --  1.9 1.9  PHOS 4.6 4.5  --  3.8  --  3.2 3.4     Liver Function Tests: Recent Labs  Lab 04/26/22 1328  AST 19  ALT 17  ALKPHOS 105  BILITOT 1.0  PROT 8.4*  ALBUMIN 3.6    Recent Labs  Lab 04/26/22 1328  LIPASE 31    No results for input(s): "AMMONIA" in the last 168 hours.  CBC: Recent Labs  Lab 04/29/22 0432 04/30/22 0002 04/30/22 0418 05/01/22 0206 05/02/22 0559  WBC 10.6* 15.3* 21.0* 8.6 7.6  HGB 10.6* 11.0* 10.0* 10.3* 9.6*  HCT 34.7* 35.2* 32.2* 34.3* 30.6*  MCV 79.8* 78.9* 78.3* 80.1 78.3*  PLT 350 357 357 344 315     Cardiac Enzymes: No results for input(s): "CKTOTAL", "CKMB", "CKMBINDEX", "TROPONINI" in the last 168 hours.  BNP: Invalid input(s): "POCBNP"  CBG: No results for input(s): "GLUCAP" in the last 168 hours.  Microbiology: Results for orders placed or performed during the hospital encounter of 04/26/22  Culture, blood (Routine X 2) w Reflex to ID Panel  Status: None (Preliminary result)   Collection Time: 04/30/22  4:18 AM   Specimen: BLOOD LEFT ARM  Result Value Ref Range Status   Specimen Description BLOOD LEFT ARM  Final   Special Requests   Final    BOTTLES DRAWN AEROBIC AND ANAEROBIC Blood Culture adequate volume   Culture   Final    NO GROWTH 3 DAYS Performed at [facility], [address], [place], [place] [address]    Report Status PENDING  Incomplete  Culture, blood (Routine X 2) w Reflex to ID Panel     Status: None (Preliminary result)   Collection Time: 04/30/22  5:04 AM   Specimen: BLOOD LEFT ARM  Result Value Ref Range Status   Specimen Description BLOOD LEFT ARM  Final    Special Requests   Final    BOTTLES DRAWN AEROBIC AND ANAEROBIC Blood Culture adequate volume   Culture   Final    NO GROWTH 3 DAYS Performed at [facility], [address] [place], [place] [address]    Report Status PENDING  Incomplete  Urine Culture     Status: Abnormal   Collection Time: 04/30/22 10:02 AM   Specimen: Urine, Clean Catch  Result Value Ref Range Status   Specimen Description   Final    URINE, CLEAN CATCH Performed at [facility], [address] [place], [place] [address]    Special Requests   Final    NONE Performed at [facility] [place], [place] [address]    Culture 40,000 COLONIES/mL ENTEROCOCCUS FAECIUM (A)  Final   Report Status 05/02/2022 FINAL  Final   Organism ID, Bacteria ENTEROCOCCUS FAECIUM (A)  Final      Susceptibility   Enterococcus faecium - MIC*    AMPICILLIN <=2 SENSITIVE Sensitive     NITROFURANTOIN 64 INTERMEDIATE Intermediate     VANCOMYCIN <=0.5 SENSITIVE Sensitive     * 40,000 COLONIES/mL ENTEROCOCCUS FAECIUM    Coagulation Studies: No results for input(s): "LABPROT", "INR" in the last 72 hours.  Urinalysis: No results for input(s): "COLORURINE", "LABSPEC", "PHURINE", "GLUCOSEU", "HGBUR", "BILIRUBINUR", "KETONESUR", "PROTEINUR", "UROBILINOGEN", "NITRITE", "LEUKOCYTESUR" in the last 72 hours.  Invalid input(s): "APPERANCEUR"     Imaging: [place] EKG SITE RITE  Result Date: 05/03/2022 If Site Rite image not attached, placement could not be confirmed due to current cardiac rhythm.  DG Abd 1 View  Result Date: 05/03/2022 CLINICAL DATA:  Abdominal pain EXAM: ABDOMEN - 1 VIEW COMPARISON:  Radiograph 05/01/2022, CT 05/01/2022 FINDINGS: Persistent small bowel dilation with decompressed colon, slightly improved from prior. Some residual contrast material is noted in the distal colon. IMPRESSION: Persistent small bowel dilation consistent with obstruction, slightly improved from prior.  Electronically Signed   By: Maurine Simmering M.D.   On: 05/03/2022 08:39     Medications:    lactated ringers 75 mL/hr at 05/03/22 1058   piperacillin-tazobactam (ZOSYN)  IV 3.375 g (05/03/22 1116)   promethazine (PHENERGAN) injection (IM or IVPB)     TPN ADULT (ION)      budesonide  0.5 mg Nebulization BID   feeding supplement  237 mL Oral TID BM   insulin aspart  0-6 Units Subcutaneous Q4H   ipratropium-albuterol  3 mL Nebulization BID   multivitamin with minerals  1 tablet Oral Daily   pantoprazole (PROTONIX) IV  40 mg Intravenous Q12H   acetaminophen **OR** acetaminophen, hydrALAZINE, lip balm, morphine injection, ondansetron **OR** ondansetron (ZOFRAN) IV, promethazine (PHENERGAN) injection (IM or IVPB)  Assessment/ Plan:  Ms. EVALUNA UTKE is a 71 y.o.  female with past medical history including COPD with renal failure requiring 2 L oxygen as needed, chronic diastolic heart failure, sleep apnea, morbid obesity, hypertension, and chronic kidney disease stage IV. Patient presents to the emergency department complaining of shortness of breath and has been admitted for   Acute Kidney Injury on chronic kidney disease stage IV with baseline creatinine 2.01 and GFR of 26 on 01/29/22.  Acute kidney injury secondary to dehydration Chronic kidney disease is secondary to hypertension and CHF CT abd pelvis negative for obstruction. No IV contrast exposure.   Renal function improved with adequate urine output recorded.  No acute need for dialysis.  Was encouraged by oral tolerance yesterday however patient has experienced setback today.  Will continue to monitor.  Primary team has suggested n.p.o. for now.  If continues, will recommend low rate IV fluids.  Lab Results  Component Value Date   CREATININE 3.18 (H) 05/03/2022   CREATININE 3.80 (H) 05/02/2022   CREATININE 4.96 (H) 05/01/2022    Intake/Output Summary (Last 24 hours) at 05/03/2022 1156 Last data filed at 05/03/2022 0729 Gross per  24 hour  Intake 0 ml  Output 1000 ml  Net -1000 ml    2. Anemia of chronic kidney disease Lab Results  Component Value Date   HGB 9.6 (L) 05/02/2022    Hemoglobin acceptable. will continue to monitor.  3.  Chronic diastolic heart failure.  Echo from 07/16/2021 shows EF 55 to 60% with mild LVH.  Fluid status currently maintained with metolazone 2.5 mg twice weekly.  All diuretics currently held.  Currently stable  4. Secondary Hyperparathyroidism: with outpatient labs: PTH 79, phosphorus 3.0, calcium 10.0 on 04/17/22.   Lab Results  Component Value Date   CALCIUM 8.8 (L) 05/03/2022   PHOS 3.4 05/03/2022    Will continue to monitor bone minerals during this admission.   LOS: 7   12/15/202311:56 AM

## 2022-05-03 NOTE — Progress Notes (Signed)
Subjective:  CC: Samantha Mason is a 71 y.o. female  Hospital stay day 7,   small bowel obstruction  HPI: Recurrent abdominal pain on full liquid diet yesterday.  Patient does endorse large amounts of bowel movements again this a.m. however.  ROS:  General: Denies weight loss, weight gain, fatigue, fevers, chills, and night sweats. Heart: Denies chest pain, palpitations, racing heart, irregular heartbeat, leg pain or swelling, and decreased activity tolerance. Respiratory: Denies breathing difficulty, shortness of breath, wheezing, cough, and sputum. GI: Denies change in appetite, heartburn, nausea, vomiting, constipation, diarrhea, and blood in stool. GU: Denies difficulty urinating, pain with urinating, urgency, frequency, blood in urine.   Objective:   Temp:  [97.4 F (36.3 C)-98.2 F (36.8 C)] 98.2 F (36.8 C) (12/15 0851) Pulse Rate:  [91-102] 102 (12/15 0851) Resp:  [18-20] 18 (12/15 0851) BP: (107-133)/(49-65) 124/50 (12/15 0851) SpO2:  [92 %-97 %] 92 % (12/15 0851) Weight:  [151.1 kg] 151.1 kg (12/15 0404)     Height: '5\' 1"'$  (154.9 cm) Weight: (!) 151.1 kg BMI (Calculated): 62.97   Intake/Output this shift:   Intake/Output Summary (Last 24 hours) at 05/03/2022 1610 Last data filed at 05/03/2022 9604 Gross per 24 hour  Intake 0 ml  Output 1000 ml  Net -1000 ml    Constitutional :  alert, cooperative, appears stated age, and no distress  Respiratory:  clear to auscultation bilaterally  Cardiovascular:  regular rate and rhythm  Gastrointestinal: Soft, no guarding, tenderness to palpation in epigastric region worse compared to yesterday. .   Skin: Cool and moist.   Psychiatric: Normal affect, non-agitated, not confused       LABS:     Latest Ref Rng & Units 05/02/2022    5:59 AM 05/01/2022    2:06 AM 04/30/2022    4:18 AM  CMP  Glucose 70 - 99 mg/dL 101  92  98   BUN 8 - 23 mg/dL 83  81  77   Creatinine 0.44 - 1.00 mg/dL 3.80  4.96  5.37   Sodium 135 - 145  mmol/L 134  134  133   Potassium 3.5 - 5.1 mmol/L 4.1  4.7  4.7   Chloride 98 - 111 mmol/L 98  96  96   CO2 22 - 32 mmol/L '25  24  25   '$ Calcium 8.9 - 10.3 mg/dL 8.7  9.1  8.9       Latest Ref Rng & Units 05/02/2022    5:59 AM 05/01/2022    2:06 AM 04/30/2022    4:18 AM  CBC  WBC 4.0 - 10.5 K/uL 7.6  8.6  21.0   Hemoglobin 12.0 - 15.0 g/dL 9.6  10.3  10.0   Hematocrit 36.0 - 46.0 % 30.6  34.3  32.2   Platelets 150 - 400 K/uL 315  344  357     RADS: Narrative & Impression  CLINICAL DATA:  Abdominal pain   EXAM: ABDOMEN - 1 VIEW   COMPARISON:  Radiograph 05/01/2022, CT 05/01/2022   FINDINGS: Persistent small bowel dilation with decompressed colon, slightly improved from prior. Some residual contrast material is noted in the distal colon.   IMPRESSION: Persistent small bowel dilation consistent with obstruction, slightly improved from prior.     Electronically Signed   By: Maurine Simmering M.D.   On: 05/03/2022 08:39     N/a  Assessment:   Pain recurrence with full liquid diet but patient continues to have copious amounts of bowel movements.  At this point, patient will likely need to proceed with exploratory laparotomy since she cannot advance with a clear liquid diet.  Patient would like to think about any surgery for now, and I would also recommend continuing n.p.o. as long as she is continuing to have bowel movements for the next couple days so that  her bowel is decompressed as much as possible on their own during her n.p.o. status prior to proceeding with surgery due to expected technically very difficult surgery due to her comorbidities and her body habitus  Also recommend starting TPN in the meantime.   labs/images/medications/previous chart entries reviewed personally and relevant changes/updates noted above.

## 2022-05-03 NOTE — Progress Notes (Signed)
Patient refused PICC insertion;stated ''I  will  try  to eat some Jello and see if my stomach can tolerate it". Primary RN also explained the importance of  having PICC and TPN nutrition, but still patient refused.primary RN to notify MD.

## 2022-05-03 NOTE — Progress Notes (Signed)
Progress Note   Patient: Samantha Mason ERD:[id number] DOB: 1950/09/19 DOA: 04/26/2022     7 DOS: the patient was seen and examined on 05/03/2022   Brief hospital course: Taken from prior note.  Samantha Mason is a 71 y.o. female with medical history significant for diastolic CHF, and COPD with chronic respiratory failure on 2 L nasal cannula as well as obstructive sleep apnea on CPAP, stage IIIb chronic kidney disease, hypertension and morbid obesity who presents to the ED with a 3-day history of crampy upper abdominal pain and a 1 day history of nonbloody nonbilious vomiting.  She denies fever or chills, diarrhea or dysuria.  Denies chest pain or shortness of breath. ED course and data review: BP 160/83 with pulse of 103 and otherwise normal vitals.  Labs with WBC 15,200.  Sodium 130, creatinine2.29, up from baseline of 1.96.  Lipase and LFTs within normal limits.  CT abdomen and pelvis showing SBO as described below: IMPRESSION: 1. Small-bowel obstruction with transition zone in the ileum. Specific etiology is not identified. 2. Aortic atherosclerosis.  Patient was admitted for SBO, general surgery was consulted and they advised conservative management.  Patient continued to refuse NG tube placement.  Worsening renal function. Nephrology was also consulted  12/12: Overnight patient became febrile with altered mental status.  Sepsis workup and protocol initiated by overnight provider.  No obvious source of infection so far.  CT head was negative for any acute intracranial abnormality.  Labs with worsening leukocytosis, creatinine and procalcitonin at 1.99.  She received fluid boluses and started on broad-spectrum antibiotics which include cefepime, Flagyl and vancomycin. Discontinuing vancomycin due to worsening renal function and no current diagnosis of MRSA Switching cefepime and Flagyl with Zosyn. Blood cultures still pending. CT abdomen and pelvis ordered-still pending. Worsening  renal function which continued to get worse despite getting some IV fluid. Might need starting of dialysis, concern of ATN.  Nephrology is on board.  12/13: Mild tachycardia and low 100s, leukocytosis resolved, hemoglobin stable, renal function with a slight improvement of creatinine to 4.96 and GFR of 9 from 8.  Patient started getting BM and passing flatus, nausea and vomiting improved today. CT abdomen with concern of worsening SBO with transition point in the anterior mid abdomen, likely due to adhesion.  No other significant abnormality-awaiting surgery team response while restarting clear liquid diet.  12/14: Hemodynamically stable, renal functions continue to improve with creatinine of 3.8 today and GFR improved to 12.  Diet was advanced to full liquid.  Hypomagnesemia resolved.  Surgery would like to monitor for another day.  12/15: Patient again developed nausea and vomiting so she was made n.p.o. by surgery.  Repeat abdominal x-ray with persistent small bowel dilation consistent with obstruction.  Having bowel movements and passing flatus.  Surgery advised n.p.o. and TPN.  Patient currently refusing PICC line placement and TPN and would like to continue trying p.o. she was also refusing surgery saying that she does not want to take any risk. Difficult patient as she is refusing most of the medical care advised. Discussed with her son and he will try discussing with her as it is just prolonging her length of stay without any meaningful intervention or help. Renal functions continue to improve, creatinine at 3.18 with GFR improved to 15 today.   Assessment and Plan: * SBO (small bowel obstruction) ([place]) CT showing small bowel obstruction with transition zone in the ileum Patient repeatedly refused NG tube placement ordered by ED after an  initial unsuccessful attempt General surgery was consulted and they were recommending conservative management initially and then advising surgery as she  did not improve after many days and repeat imaging with persistent bowel obstruction. Repeat CT abdomen with worsening of SBO and transition point, concern of adhesion.  Unable to tolerate full liquid diet so she was made n.p.o. again. Patient currently refusing surgery, and even PICC line placement for TPN as suggested by general surgery saying that she does not want to take any risk. -Currently again n.p.o. due to recurrence of nausea and vomiting -Continue with IV hydration, antiemetics and pain control   AMS (altered mental status) Resolved Patient transiently became altered and febrile overnight.  Worsening leukocytosis, has been resolved Chest x-ray negative for pneumonia or pulmonary edema. CT head negative for any acute intracranial abnormality. Procalcitonin elevated at 1.11.  UA with microscopic hematuria. Urine cultures with Enterococcus faecalis and blood cultures negative so far.  CT abdomen with worsening of SBO and no other acute abnormality.  She was given IV fluid and started on broad-spectrum antibiotics. -Continue Zosyn--completed total of 7 days course  CKD (chronic kidney disease) stage 4, GFR 15-29 ml/min (HCC) AKI with CKD stage IV. Renal function started improving with creatinine currently at 3.18 with baseline around 2.29. Nephrology was also consulted.  No contrast exposure.  Concern of ATN. Patient also refused Foley catheter placement for urinary output measurement. -Nephrology would like to continue IV fluid -Monitor renal function -Avoid nephrotoxins  Chronic diastolic CHF (congestive heart failure) (HCC) Clinically euvolemic to dry Monitor for fluid overload in view of IV hydration with as needed IV Lasix Daily weights  Essential hypertension Blood pressure within goal Hydralazine IV as needed for elevated blood pressure  -Home antihypertensives are being currently held due to AKI and SBO  Chronic respiratory failure with hypoxia and hypercapnia  (HCC) O2 requirement at baseline Continue supplemental oxygen  Obstructive lung disease (generalized) ([place]) Not acutely exacerbated Continue Pulmicort twice daily with DuoNebs as needed  Morbid obesity with BMI of 50.0-59.9, adult (HCC) Estimated body mass index is 61.31 kg/m as calculated from the following:   Height as of this encounter: '5\' 1"'$  (1.549 m).   Weight as of this encounter: 952.8 kg.   -Complicating factor to overall prognosis and care  OSA  CPAP nightly   Subjective: Patient was seen and examined today.  She was having some nausea and vomiting.  Having liquidy bowel movements and passing flatus.  I explained that surgery is recommending proceeding with surgery now but she said that she cannot take any risk.  Tried explaining to my best that every single procedure comes with the risk and sometimes we have to take them in order to get better.  She is still not very convinced and does not want any procedure which comes with risks?  Physical Exam: Vitals:   05/02/22 1928 05/03/22 0404 05/03/22 0851 05/03/22 1421  BP:  (!) 123/56 (!) 124/50 136/70  Pulse:  98 (!) 102 93  Resp:  '20 18 14  '$ Temp:  98.1 F (36.7 C) 98.2 F (36.8 C) 97.9 F (36.6 C)  TempSrc:  Oral Oral   SpO2: 97% 96% 92% 94%  Weight:  (!) 151.1 kg    Height:       General.  Morbidly obese lady, in no acute distress. Pulmonary.  Lungs clear bilaterally, normal respiratory effort. CV.  Regular rate and rhythm, no JVD, rub or murmur. Abdomen.  Soft, nontender, nondistended, BS positive. CNS.  Alert and oriented .  No focal neurologic deficit. Extremities.  Trace LE edema, no cyanosis, pulses intact and symmetrical. Psychiatry.  Judgment and insight appears normal.    Data Reviewed: Prior data reviewed  Family Communication: Discussed with son on phone, he will try discussing with his mother to see if he can convince her cooperating with medical management and advises.  Disposition: Status is:  Inpatient Remains inpatient appropriate because: Severity of illness-patient is refusing most of the medical care suggested which is resulted in prolonged length of stay.  Planned Discharge Destination: Home with home health  DVT prophylaxis.  Heparin subcu Time spent: 42 minutes  This record has been created using Systems analyst. Errors have been sought and corrected,but may not always be located. Such creation errors do not reflect on the standard of care.   Author: Lorella Nimrod, MD 05/03/2022 3:11 PM  For on call review [email or web address].

## 2022-05-04 DIAGNOSIS — K56609 Unspecified intestinal obstruction, unspecified as to partial versus complete obstruction: Secondary | ICD-10-CM | POA: Diagnosis not present

## 2022-05-04 LAB — COMPREHENSIVE METABOLIC PANEL
ALT: 14 U/L (ref 0–44)
AST: 12 U/L — ABNORMAL LOW (ref 15–41)
Albumin: 2.8 g/dL — ABNORMAL LOW (ref 3.5–5.0)
Alkaline Phosphatase: 71 U/L (ref 38–126)
Anion gap: 9 (ref 5–15)
BUN: 69 mg/dL — ABNORMAL HIGH (ref 8–23)
CO2: 26 mmol/L (ref 22–32)
Calcium: 8.7 mg/dL — ABNORMAL LOW (ref 8.9–10.3)
Chloride: 101 mmol/L (ref 98–111)
Creatinine, Ser: 2.85 mg/dL — ABNORMAL HIGH (ref 0.44–1.00)
GFR, Estimated: 17 mL/min — ABNORMAL LOW (ref >60)
Glucose, Bld: 83 mg/dL (ref 70–99)
Potassium: 3.9 mmol/L (ref 3.5–5.1)
Sodium: 136 mmol/L (ref 135–145)
Total Bilirubin: 0.6 mg/dL (ref 0.3–1.2)
Total Protein: 6.6 g/dL (ref 6.5–8.1)

## 2022-05-04 LAB — GLUCOSE, CAPILLARY
Glucose-Capillary: 103 mg/dL — ABNORMAL HIGH (ref 70–99)
Glucose-Capillary: 72 mg/dL (ref 70–99)
Glucose-Capillary: 74 mg/dL (ref 70–99)
Glucose-Capillary: 80 mg/dL (ref 70–99)
Glucose-Capillary: 80 mg/dL (ref 70–99)
Glucose-Capillary: 82 mg/dL (ref 70–99)
Glucose-Capillary: 89 mg/dL (ref 70–99)

## 2022-05-04 LAB — MAGNESIUM: Magnesium: 1.8 mg/dL (ref 1.7–2.4)

## 2022-05-04 LAB — PHOSPHORUS: Phosphorus: 2.7 mg/dL (ref 2.5–4.6)

## 2022-05-04 LAB — TRIGLYCERIDES: Triglycerides: 100 mg/dL (ref <150)

## 2022-05-04 MED ORDER — SODIUM CHLORIDE 0.9% FLUSH: 10.0000 mL | INTRAVENOUS | Status: DC | PRN

## 2022-05-04 MED ORDER — TRACE MINERALS CU-MN-SE-ZN 300-55-60-3000 MCG/ML IV SOLN
INTRAVENOUS | Status: AC
  Filled 2022-05-04: qty 448

## 2022-05-04 MED ORDER — MAGNESIUM SULFATE 2 GM/50ML IV SOLN
2.0000 g | Freq: Once | INTRAVENOUS | Status: AC
  Administered 2022-05-04: 2 g via INTRAVENOUS
  Filled 2022-05-04: qty 50

## 2022-05-04 MED ORDER — CHLORHEXIDINE GLUCONATE CLOTH 2 % EX PADS
6.0000 | MEDICATED_PAD | Freq: Every day | CUTANEOUS | Status: DC
  Administered 2022-05-04 – 2022-05-10 (×7): 6 via TOPICAL

## 2022-05-04 MED ORDER — TRACE MINERALS CU-MN-SE-ZN 300-55-60-3000 MCG/ML IV SOLN: INTRAVENOUS | Status: DC

## 2022-05-04 MED ORDER — SODIUM CHLORIDE 0.9% FLUSH
10.0000 mL | Freq: Two times a day (BID) | INTRAVENOUS | Status: DC
  Administered 2022-05-04 – 2022-05-06 (×4): 10 mL
  Administered 2022-05-06: 20 mL
  Administered 2022-05-07 – 2022-05-10 (×7): 10 mL

## 2022-05-04 NOTE — Progress Notes (Addendum)
PHARMACY - TOTAL PARENTERAL NUTRITION CONSULT NOTE   Indication:  small bowel obstruction  Patient Measurements: Height: '5\' 1"'$  (154.9 cm) Weight: (!) 151.1 kg (333 lb 1.8 oz) IBW/kg (Calculated) : 47.8 TPN AdjBW (KG): 73 Body mass index is 62.94 kg/m. Usual Weight: 151 kg  Assessment: 71 year old female with PMH dCHF, COPD on 2L PTA, OSA on CPAP, CKD3b, HTN, and morbid obesity admitted with small bowel obstruction unable to tolerate diet. Hospital day 5. Surgery planning for possible exploratory laparatomy. Planning for TPN while awaiting surgery and need for decompressed bowel prior to possible surgery.  Glucose / Insulin: CBGs < 180.  TPN start 12/15- watch, may be able to d/c SSI if BG remain WNL Electrolytes: Na 136. Lytes WNL. 12/16 Mag 1.8   Renal: Scr trending down: 2.85 Hepatic: AST 12/ALT 14 TG: 100 Intake / Output; MIVF:  GI Imaging: GI Surgeries / Procedures:    Central access: to get PICC 12/16 TPN start date: refused PICC 12/15- now plan to get PICC/ start TPN 12/16  Nutritional Goals: Goal TPN rate is 80 mL/hr (provides 130 g of protein and 2600 kcals per day)  Kcal:  2300-2600kcal/day Protein: 115-130g/day Fluid:  1.4-1.6L/day  Nutritional components at goal rate: Clinisol: 70 g/L Dextrose: 18% Smof lipids: 45 g/L   RD Assessment: Estimated Needs Total Energy Estimated Needs: 2300-2600kcal/day Total Protein Estimated Needs: 115-130g/day Total Fluid Estimated Needs: 1.4-1.6L/day  Current Nutrition:  NPO  Plan: To get PICC today 12/16 and will start TPN at 40 mL/hr   Clinisol: 70 g/L Dextrose: 18% Smof lipids: 45 g/L Electrolytes in TPN: Na 67mq/L, K 576m/L, Ca 35m235mL, Mg 35mE60m, and Phos 135mm51m. Cl:Ac 1:1 Mag 1.8  Will order Magnesium sulfate 2 gm x 1 Add standard MVI and trace elements to TPN Initiate very Sensitive q4h SSI and adjust as needed   MIVF: LR at 50 mL/hr  Monitor TPN labs in am and on Mon/Thurs, and as needed.   KristChinita GreenlandmD Clinical Pharmacist 05/04/2022

## 2022-05-04 NOTE — Progress Notes (Signed)
Peripherally Inserted Central Catheter Placement  The IV Nurse has discussed with the patient and/or persons authorized to consent for the patient, the purpose of this procedure and the potential benefits and risks involved with this procedure.  The benefits include less needle sticks, lab draws from the catheter, and the patient may be discharged home with the catheter. Risks include, but not limited to, infection, bleeding, blood clot (thrombus formation), and puncture of an artery; nerve damage and irregular heartbeat and possibility to perform a PICC exchange if needed/ordered by physician.  Alternatives to this procedure were also discussed.  Bard Power PICC patient education guide, fact sheet on infection prevention and patient information card has been provided to patient /or left at bedside.    PICC Placement Documentation  PICC Double Lumen 05/04/22 Right Brachial 38 cm 0 cm (Active)  Indication for Insertion or Continuance of Line Administration of hyperosmolar/irritating solutions (i.e. TPN, Vancomycin, etc.) 05/04/22 1202  Exposed Catheter (cm) 0 cm 05/04/22 1202  Site Assessment Clean, Dry, Intact 05/04/22 1202  Lumen #1 Status Flushed;Saline locked;Blood return noted 05/04/22 1202  Lumen #2 Status Flushed;Saline locked;Blood return noted 05/04/22 1202  Dressing Type Transparent;Securing device 05/04/22 1202  Dressing Status Antimicrobial disc in place;Clean, Dry, Intact 05/04/22 1202  Safety Lock Not Applicable 43/27/61 4709  Line Care Connections checked and tightened 05/04/22 1202  Line Adjustment (NICU/IV Team Only) No 05/04/22 1202  Dressing Intervention New dressing 05/04/22 1202  Dressing Change Due 05/11/22 05/04/22 1202       Rolena Infante 05/04/2022, 12:02 PM

## 2022-05-04 NOTE — Progress Notes (Signed)
05/04/2022  Subjective: No acute events overnight.  Patient was unable to get a PICC line yesterday but she agreed to getting one today.  Reports feeling better from the abdominal standpoint.  She continues to have bowel movements and had bowel movements yesterday.  She reports also that does not feel that there is anything as stuck in the lower chest as before.  Vital signs: Temp:  [97.6 F (36.4 C)-98.6 F (37 C)] 97.6 F (36.4 C) (12/16 0745) Pulse Rate:  [85-94] 85 (12/16 0745) Resp:  [14-18] 16 (12/16 0745) BP: (107-140)/(44-70) 123/46 (12/16 0745) SpO2:  [90 %-96 %] 90 % (12/16 0816)   Intake/Output: 12/15 0701 - 12/16 0700 In: 0  Out: 650 [Urine:650] Last BM Date : 05/03/22  Physical Exam: Constitutional: No acute distress Abdomen: Soft, nondistended, no significant tenderness to palpation.  Labs:  Recent Labs    05/02/22 0559  WBC 7.6  HGB 9.6*  HCT 30.6*  PLT 315   Recent Labs    05/03/22 0631 05/04/22 0510  NA 133* 136  K 4.2 3.9  CL 98 101  CO2 24 26  GLUCOSE 101* 83  BUN 71* 69*  CREATININE 3.18* 2.85*  CALCIUM 8.8* 8.7*   No results for input(s): "LABPROT", "INR" in the last 72 hours.  Imaging: No results found.  Assessment/Plan: This is a 71 y.o. female with small bowel obstruction.  - Patient reports that she is feeling better from the abdominal standpoint and continues having bowel function.  Discussed with her the plan per Dr. Lysle Allora to keep her n.p.o. over the weekend to allow for more of the bowels to fully decompressed prior to starting her on a diet again versus proceeding with definitive surgery. - Patient is in agreement to having a PICC line placed today and that will be done this morning and will start TPN this evening. - Ambulate as tolerated   I spent 35 minutes dedicated to the care of this patient on the date of this encounter to include pre-visit review of records, face-to-face time with the patient discussing diagnosis and  management, and any post-visit coordination of care.  Melvyn Neth, [facility]

## 2022-05-04 NOTE — Progress Notes (Signed)
[facility]  PROGRESS NOTE   Subjective:   Patient is out of bed to chair.  Breathing much better.  Patient was admitted for small bowel obstruction.  Objective:  Vital signs: Blood pressure (!) 123/46, pulse 85, temperature 97.6 F (36.4 C), temperature source Oral, resp. rate 16, height '5\' 1"'$  (1.549 m), weight (!) 151.1 kg, SpO2 90 %.  Intake/Output Summary (Last 24 hours) at 05/04/2022 1313 Last data filed at 05/04/2022 0300 Gross per 24 hour  Intake --  Output 650 ml  Net -650 ml   Filed Weights   04/27/22 0520 04/29/22 0650 05/03/22 0404  Weight: (!) 148.8 kg (!) 147.2 kg (!) 151.1 kg     Physical Exam: General:  No acute distress  Head:  Normocephalic, atraumatic. Moist oral mucosal membranes  Eyes:  Anicteric  Neck:  Supple  Lungs:   Clear to auscultation, normal effort  Heart:  S1S2 no rubs  Abdomen:   Soft  Extremities:  peripheral edema.  Neurologic:  Awake, alert, following commands  Skin:  No lesions  Access:     Basic Metabolic Panel: Recent Labs  Lab 04/29/22 0432 04/30/22 0002 04/30/22 0418 05/01/22 0206 05/02/22 0559 05/03/22 0631 05/04/22 0510  NA 132*   < > 133* 134* 134* 133* 136  K 4.3   < > 4.7 4.7 4.1 4.2 3.9  CL 92*   < > 96* 96* 98 98 101  CO2 27   < > '25 24 25 24 26  '$ GLUCOSE 97   < > 98 92 101* 101* 83  BUN 73*   < > 77* 81* 83* 71* 69*  CREATININE 5.17*   < > 5.37* 4.96* 3.80* 3.18* 2.85*  CALCIUM 9.0   < > 8.9 9.1 8.7* 8.8* 8.7*  MG 1.9  --  1.6*  --  1.9 1.9 1.8  PHOS 4.5  --  3.8  --  3.2 3.4 2.7   < > = values in this interval not displayed.    CBC: Recent Labs  Lab 04/29/22 0432 04/30/22 0002 04/30/22 0418 05/01/22 0206 05/02/22 0559  WBC 10.6* 15.3* 21.0* 8.6 7.6  HGB 10.6* 11.0* 10.0* 10.3* 9.6*  HCT 34.7* 35.2* 32.2* 34.3* 30.6*  MCV 79.8* 78.9* 78.3* 80.1 78.3*  PLT 350 357 357 344 315     Urinalysis: No results for input(s): "COLORURINE", "LABSPEC", "PHURINE", "GLUCOSEU", "HGBUR",  "BILIRUBINUR", "KETONESUR", "PROTEINUR", "UROBILINOGEN", "NITRITE", "LEUKOCYTESUR" in the last 72 hours.  Invalid input(s): "APPERANCEUR"    Imaging: [place] EKG SITE RITE  Result Date: 05/03/2022 If Site Rite image not attached, placement could not be confirmed due to current cardiac rhythm.  DG Abd 1 View  Result Date: 05/03/2022 CLINICAL DATA:  Abdominal pain EXAM: ABDOMEN - 1 VIEW COMPARISON:  Radiograph 05/01/2022, CT 05/01/2022 FINDINGS: Persistent small bowel dilation with decompressed colon, slightly improved from prior. Some residual contrast material is noted in the distal colon. IMPRESSION: Persistent small bowel dilation consistent with obstruction, slightly improved from prior. Electronically Signed   By: Maurine Simmering M.D.   On: 05/03/2022 08:39     Medications:    lactated ringers 50 mL/hr at 05/03/22 1235   magnesium sulfate bolus IVPB     piperacillin-tazobactam (ZOSYN)  IV 3.375 g (05/04/22 0654)   promethazine (PHENERGAN) injection (IM or IVPB) 12.5 mg (05/04/22 0105)   TPN ADULT (ION)     TPN ADULT (ION)      budesonide  0.5 mg Nebulization BID   Chlorhexidine Gluconate Cloth  6 each Topical Daily   insulin aspart  0-6 Units Subcutaneous Q4H   ipratropium-albuterol  3 mL Nebulization BID   multivitamin with minerals  1 tablet Oral Daily   pantoprazole (PROTONIX) IV  40 mg Intravenous Q12H   sodium chloride flush  10-40 mL Intracatheter Q12H    Assessment/ Plan:     Principal Problem:   SBO (small bowel obstruction) (HCC) Active Problems:   Morbid obesity with BMI of 50.0-59.9, adult (HCC)   OSA    Essential hypertension   Obstructive lung disease (generalized) (HCC)   Chronic diastolic CHF (congestive heart failure) (HCC)   Chronic respiratory failure with hypoxia and hypercapnia (HCC)   CKD (chronic kidney disease) stage 4, GFR 15-29 ml/min (HCC)   AMS (altered mental status)  Samantha Mason is a 71 year old female with past medical history including  COPD with renal failure requiring 2 L oxygen as needed, chronic diastolic heart failure, sleep apnea, morbid obesity, hypertension, and chronic kidney disease stage IV. Patient presents to the emergency department complaining of abdominal pain and has been admitted for Small bowel obstruction ([place]) [K56.609] SBO (small bowel obstruction) ([place]) [K56.609]   Patient is known to our practice and is followed by Dr Holley Raring outpatient. She was last seen in office on 04/17/22.  #1: Acute kidney injury on chronic kidney disease: Urine output has been excellent.  Renal indices have significantly improved since admission.  Will continue the IV fluids with Ringer's lactate.  #2: Anemia of chronic kidney disease: Will continue to monitor closely.  #3: Congestive heart failure: Will monitor closely.  #4: Chronic shortness of breath/chronic respiratory failure: Continue oxygen supplementation along with CPAP at night.  #5: Small bowel obstruction: Slowly but steadily improving.  Continue IV fluids and also IV antibiotics.  Will follow closely.   LOS: [place], [facility] 12/16/20231:13 PM

## 2022-05-04 NOTE — Progress Notes (Signed)
Progress Note   Patient: Samantha Mason OMV:[id number] DOB: 1950/07/08 DOA: 04/26/2022     8 DOS: the patient was seen and examined on 05/04/2022   Brief hospital course: Taken from prior note.  Samantha Mason is a 71 y.o. female with medical history significant for diastolic CHF, and COPD with chronic respiratory failure on 2 L nasal cannula as well as obstructive sleep apnea on CPAP, stage IIIb chronic kidney disease, hypertension and morbid obesity who presents to the ED with a 3-day history of crampy upper abdominal pain and a 1 day history of nonbloody nonbilious vomiting.  She denies fever or chills, diarrhea or dysuria.  Denies chest pain or shortness of breath. ED course and data review: BP 160/83 with pulse of 103 and otherwise normal vitals.  Labs with WBC 15,200.  Sodium 130, creatinine2.29, up from baseline of 1.96.  Lipase and LFTs within normal limits.  CT abdomen and pelvis showing SBO as described below: IMPRESSION: 1. Small-bowel obstruction with transition zone in the ileum. Specific etiology is not identified. 2. Aortic atherosclerosis.  Patient was admitted for SBO, general surgery was consulted and they advised conservative management.  Patient continued to refuse NG tube placement.  Worsening renal function. Nephrology was also consulted  12/12: Overnight patient became febrile with altered mental status.  Sepsis workup and protocol initiated by overnight provider.  No obvious source of infection so far.  CT head was negative for any acute intracranial abnormality.  Labs with worsening leukocytosis, creatinine and procalcitonin at 1.99.  She received fluid boluses and started on broad-spectrum antibiotics which include cefepime, Flagyl and vancomycin. Discontinuing vancomycin due to worsening renal function and no current diagnosis of MRSA Switching cefepime and Flagyl with Zosyn. Blood cultures still pending. CT abdomen and pelvis ordered-still pending. Worsening  renal function which continued to get worse despite getting some IV fluid. Might need starting of dialysis, concern of ATN.  Nephrology is on board.  12/13: Mild tachycardia and low 100s, leukocytosis resolved, hemoglobin stable, renal function with a slight improvement of creatinine to 4.96 and GFR of 9 from 8.  Patient started getting BM and passing flatus, nausea and vomiting improved today. CT abdomen with concern of worsening SBO with transition point in the anterior mid abdomen, likely due to adhesion.  No other significant abnormality-awaiting surgery team response while restarting clear liquid diet.  12/14: Hemodynamically stable, renal functions continue to improve with creatinine of 3.8 today and GFR improved to 12.  Diet was advanced to full liquid.  Hypomagnesemia resolved.  Surgery would like to monitor for another day.  12/15: Patient again developed nausea and vomiting so she was made n.p.o. by surgery.  Repeat abdominal x-ray with persistent small bowel dilation consistent with obstruction.  Having bowel movements and passing flatus.  Surgery advised n.p.o. and TPN.  Patient currently refusing PICC line placement and TPN and would like to continue trying p.o. she was also refusing surgery saying that she does not want to take any risk. Difficult patient as she is refusing most of the medical care advised. Discussed with her son and he will try discussing with her as it is just prolonging her length of stay without any meaningful intervention or help. Renal functions continue to improve, creatinine at 3.18 with GFR improved to 15 today.  12/16: Hemodynamically stable.  Renal functions continue to improve with creatinine at 2.85, and GFR of 17.  Electrolytes within normal limit.  Agreed to proceed with PICC line for TPN.  She is having bowel movement but continued to have nausea.  She would like to wait and still not ready for any surgery.   Assessment and Plan: * SBO (small bowel  obstruction) (HCC) CT showing small bowel obstruction with transition zone in the ileum Patient repeatedly refused NG tube placement ordered by ED after an initial unsuccessful attempt General surgery was consulted and they were recommending conservative management initially and then advising surgery as she did not improve after many days and repeat imaging with persistent bowel obstruction. Repeat CT abdomen with worsening of SBO and transition point, concern of adhesion.  Unable to tolerate full liquid diet so she was made n.p.o. again. Patient currently refusing surgery, and even PICC line placement for TPN as suggested by general surgery saying that she does not want to take any risk. -Currently again n.p.o. due to recurrence of nausea and vomiting -Continue with IV hydration, antiemetics and pain control -Patient will be started on TPN   AMS (altered mental status) Resolved Patient transiently became altered and febrile overnight.  Worsening leukocytosis, has been resolved Chest x-ray negative for pneumonia or pulmonary edema. CT head negative for any acute intracranial abnormality. Procalcitonin elevated at 1.11.  UA with microscopic hematuria. Urine cultures with Enterococcus faecalis and blood cultures negative so far.  CT abdomen with worsening of SBO and no other acute abnormality.  She was given IV fluid and started on broad-spectrum antibiotics. -Continue Zosyn--completed total of 7 days course  CKD (chronic kidney disease) stage 4, GFR 15-29 ml/min (HCC) AKI with CKD stage IV. Renal function started improving with creatinine currently at 2.85 with baseline around 2.29. Nephrology was also consulted.  No contrast exposure.  Concern of ATN. Patient also refused Foley catheter placement for urinary output measurement. -Nephrology would like to continue IV fluid -Monitor renal function -Avoid nephrotoxins  Chronic diastolic CHF (congestive heart failure) (HCC) Clinically  euvolemic to dry Monitor for fluid overload in view of IV hydration with as needed IV Lasix Daily weights  Essential hypertension Blood pressure within goal Hydralazine IV as needed for elevated blood pressure  -Home antihypertensives are being currently held due to AKI and SBO  Chronic respiratory failure with hypoxia and hypercapnia (HCC) O2 requirement at baseline Continue supplemental oxygen  Obstructive lung disease (generalized) ([place]) Not acutely exacerbated Continue Pulmicort twice daily with DuoNebs as needed  Morbid obesity with BMI of 50.0-59.9, adult (HCC) Estimated body mass index is 61.31 kg/m as calculated from the following:   Height as of this encounter: '5\' 1"'$  (1.549 m).   Weight as of this encounter: 017.7 kg.   -Complicating factor to overall prognosis and care  OSA  CPAP nightly   Subjective: Patient continued to have some nausea but saying that vomiting is much improved.  Having bowel movement and passing flatus.  She got her PICC line earlier today.  She would like to continue waiting with TPN and will avoid surgery if possible.  Physical Exam: Vitals:   05/04/22 0444 05/04/22 0745 05/04/22 0816 05/04/22 1524  BP: (!) 107/44 (!) 123/46  (!) 118/55  Pulse: 94 85  88  Resp: '16 16  16  '$ Temp: 97.6 F (36.4 C) 97.6 F (36.4 C)  97.7 F (36.5 C)  TempSrc:  Oral  Oral  SpO2: 95% 93% 90% 97%  Weight:      Height:       General.  Morbidly obese lady, in no acute distress. Pulmonary.  Lungs clear bilaterally, normal respiratory effort. CV.  Regular rate and rhythm, no JVD, rub or murmur. Abdomen.  Soft, nontender, nondistended, BS positive. CNS.  Alert and oriented .  No focal neurologic deficit. Extremities.  No edema, no cyanosis, pulses intact and symmetrical. Psychiatry.  Judgment and insight appears normal.     Data Reviewed: Prior data reviewed  Family Communication: Discussed with son at bedside  Disposition: Status is: Inpatient Remains  inpatient appropriate because: Severity of illness-patient is refusing most of the medical care suggested which is resulted in prolonged length of stay.  Planned Discharge Destination: Home with home health  DVT prophylaxis.  Heparin subcu Time spent: 40 minutes  This record has been created using Systems analyst. Errors have been sought and corrected,but may not always be located. Such creation errors do not reflect on the standard of care.   Author: Lorella Nimrod, MD 05/04/2022 3:25 PM  For on call review [email or web address].

## 2022-05-04 NOTE — Progress Notes (Signed)
Pt agreed to have PIC line placed

## 2022-05-04 NOTE — Plan of Care (Signed)

## 2022-05-05 DIAGNOSIS — K56609 Unspecified intestinal obstruction, unspecified as to partial versus complete obstruction: Secondary | ICD-10-CM | POA: Diagnosis not present

## 2022-05-05 LAB — BASIC METABOLIC PANEL
Anion gap: 7 (ref 5–15)
BUN: 58 mg/dL — ABNORMAL HIGH (ref 8–23)
CO2: 27 mmol/L (ref 22–32)
Calcium: 8.7 mg/dL — ABNORMAL LOW (ref 8.9–10.3)
Chloride: 103 mmol/L (ref 98–111)
Creatinine, Ser: 2.31 mg/dL — ABNORMAL HIGH (ref 0.44–1.00)
GFR, Estimated: 22 mL/min — ABNORMAL LOW (ref >60)
Glucose, Bld: 122 mg/dL — ABNORMAL HIGH (ref 70–99)
Potassium: 3.8 mmol/L (ref 3.5–5.1)
Sodium: 137 mmol/L (ref 135–145)

## 2022-05-05 LAB — CULTURE, BLOOD (ROUTINE X 2)
Culture: NO GROWTH
Culture: NO GROWTH
Special Requests: ADEQUATE
Special Requests: ADEQUATE

## 2022-05-05 LAB — GLUCOSE, CAPILLARY
Glucose-Capillary: 111 mg/dL — ABNORMAL HIGH (ref 70–99)
Glucose-Capillary: 112 mg/dL — ABNORMAL HIGH (ref 70–99)
Glucose-Capillary: 126 mg/dL — ABNORMAL HIGH (ref 70–99)
Glucose-Capillary: 143 mg/dL — ABNORMAL HIGH (ref 70–99)
Glucose-Capillary: 116 mg/dL — ABNORMAL HIGH (ref 70–99)

## 2022-05-05 LAB — PHOSPHORUS: Phosphorus: 2.7 mg/dL (ref 2.5–4.6)

## 2022-05-05 LAB — MAGNESIUM: Magnesium: 2.2 mg/dL (ref 1.7–2.4)

## 2022-05-05 MED ORDER — TRACE MINERALS CU-MN-SE-ZN 300-55-60-3000 MCG/ML IV SOLN
INTRAVENOUS | Status: AC
  Filled 2022-05-05: qty 896

## 2022-05-05 NOTE — Progress Notes (Signed)
[facility]  PROGRESS NOTE   Subjective:   Patient is out of bed to chair. Breathing much better. Patient was admitted for small bowel obstruction.  Surgery note appreciated, patient started on TPN  Objective:  Vital signs: Blood pressure (!) 119/48, pulse 88, temperature 97.8 F (36.6 C), resp. rate 20, height '5\' 1"'$  (1.549 m), weight (!) 151.1 kg, SpO2 97 %.  Intake/Output Summary (Last 24 hours) at 05/05/2022 1205 Last data filed at 05/04/2022 1919 Gross per 24 hour  Intake 150 ml  Output 300 ml  Net -150 ml   Filed Weights   04/27/22 0520 04/29/22 0650 05/03/22 0404  Weight: (!) 148.8 kg (!) 147.2 kg (!) 151.1 kg     Physical Exam: General:  No acute distress  Head:  Normocephalic, atraumatic. Moist oral mucosal membranes  Eyes:  Anicteric  Neck:  Supple  Lungs:   Clear to auscultation, normal effort  Heart:  S1S2 no rubs  Abdomen:   Soft  Extremities: 1+ peripheral edema.  Neurologic:  Awake, alert, following commands  Skin:  No lesions  Access:     Basic Metabolic Panel: Recent Labs  Lab 04/30/22 0418 05/01/22 0206 05/02/22 0559 05/03/22 0631 05/04/22 0510 05/05/22 0355  NA 133* 134* 134* 133* 136 137  K 4.7 4.7 4.1 4.2 3.9 3.8  CL 96* 96* 98 98 101 103  CO2 '25 24 25 24 26 27  '$ GLUCOSE 98 92 101* 101* 83 122*  BUN 77* 81* 83* 71* 69* 58*  CREATININE 5.37* 4.96* 3.80* 3.18* 2.85* 2.31*  CALCIUM 8.9 9.1 8.7* 8.8* 8.7* 8.7*  MG 1.6*  --  1.9 1.9 1.8 2.2  PHOS 3.8  --  3.2 3.4 2.7 2.7    CBC: Recent Labs  Lab 04/29/22 0432 04/30/22 0002 04/30/22 0418 05/01/22 0206 05/02/22 0559  WBC 10.6* 15.3* 21.0* 8.6 7.6  HGB 10.6* 11.0* 10.0* 10.3* 9.6*  HCT 34.7* 35.2* 32.2* 34.3* 30.6*  MCV 79.8* 78.9* 78.3* 80.1 78.3*  PLT 350 357 357 344 315     Urinalysis: No results for input(s): "COLORURINE", "LABSPEC", "PHURINE", "GLUCOSEU", "HGBUR", "BILIRUBINUR", "KETONESUR", "PROTEINUR", "UROBILINOGEN", "NITRITE", "LEUKOCYTESUR" in the last 72  hours.  Invalid input(s): "APPERANCEUR"    Imaging: No results found.   Medications:    lactated ringers 50 mL/hr at 05/03/22 1235   piperacillin-tazobactam (ZOSYN)  IV 3.375 g (05/05/22 0516)   promethazine (PHENERGAN) injection (IM or IVPB) Stopped (05/04/22 1502)   TPN ADULT (ION) 40 mL/hr at 05/04/22 1819   TPN ADULT (ION)      budesonide  0.5 mg Nebulization BID   Chlorhexidine Gluconate Cloth  6 each Topical Daily   insulin aspart  0-6 Units Subcutaneous Q4H   ipratropium-albuterol  3 mL Nebulization BID   multivitamin with minerals  1 tablet Oral Daily   pantoprazole (PROTONIX) IV  40 mg Intravenous Q12H   sodium chloride flush  10-40 mL Intracatheter Q12H    Assessment/ Plan:     Principal Problem:   SBO (small bowel obstruction) (HCC) Active Problems:   Morbid obesity with BMI of 50.0-59.9, adult (HCC)   OSA    Essential hypertension   Obstructive lung disease (generalized) (HCC)   Chronic diastolic CHF (congestive heart failure) (HCC)   Chronic respiratory failure with hypoxia and hypercapnia (HCC)   CKD (chronic kidney disease) stage 4, GFR 15-29 ml/min (HCC)   AMS (altered mental status)  Samantha Mason is a 71 year old female with past medical history including COPD with renal  failure requiring 2 L oxygen as needed, chronic diastolic heart failure, sleep apnea, morbid obesity, hypertension, and chronic kidney disease stage IV. Patient presents to the emergency department complaining of abdominal pain and has been admitted for Small bowel obstruction ([place]) [K56.609] SBO (small bowel obstruction) ([place]) [K56.609]   Patient is known to our practice and is followed by Dr Holley Raring outpatient. She was last seen in office on 04/17/22.   #1: Acute kidney injury on chronic kidney disease: Urine output has been excellent.  Renal indices have significantly improved since admission.  Will continue the IV fluids with Ringer's lactate.   #2: Anemia of chronic kidney  disease: Will continue to monitor closely.   #3: Congestive heart failure: Will monitor closely.   #4: Chronic shortness of breath/chronic respiratory failure: Continue oxygen supplementation along with CPAP at night.   #5: Small bowel obstruction: Slowly but steadily improving.  Started on TPN.  Continue IV fluids and also IV antibiotics.   Will follow closely.   LOS: Craig, [facility] 12/17/202312:05 PM

## 2022-05-05 NOTE — Progress Notes (Signed)
PHARMACY - TOTAL PARENTERAL NUTRITION CONSULT NOTE   Indication:  small bowel obstruction  Patient Measurements: Height: '5\' 1"'$  (154.9 cm) Weight: (!) 151.1 kg (333 lb 1.8 oz) IBW/kg (Calculated) : 47.8 TPN AdjBW (KG): 73 Body mass index is 62.94 kg/m. Usual Weight: 151 kg  Assessment: 71 year old female with PMH dCHF, COPD on 2L PTA, OSA on CPAP, CKD3b, HTN, and morbid obesity admitted with small bowel obstruction unable to tolerate diet. Hospital day 5. Surgery planning for possible exploratory laparatomy. Planning for TPN while awaiting surgery and need for decompressed bowel prior to possible surgery. 12/17-having BMs, may hold off on any surgery  Glucose / Insulin: CBGs 72-122.  TPN start 12/16- watch, may be able to d/c SSI if BG remain WNL Electrolytes: Na 137. Lytes WNL. Renal: Scr trending down: 2.85>>2.31 Hepatic: AST 12/ALT 14 TG: 100 Intake / Output; MIVF:  GI Imaging: GI Surgeries / Procedures:    Central access: to get PICC 12/16 TPN start date: started TPN 12/16  Nutritional Goals: Goal TPN rate is 80 mL/hr (provides 130 g of protein and 2480 kcals per day)  Kcal:  2300-2600kcal/day Protein: 115-130g/day Fluid:  1.4-1.6L/day  BMI=62.9  Nutritional components at goal rate: Clinisol: 70 g/L Dextrose: 18% Smof lipids: 40 g/L   RD Assessment: Estimated Needs Total Energy Estimated Needs: 2300-2600kcal/day Total Protein Estimated Needs: 115-130g/day Total Fluid Estimated Needs: 1.4-1.6L/day  Current Nutrition:  NPO  Plan: Will increase TPN to goal rate of 80 mL/hr  -Clinisol(Aminio Acids): 70 g/L -Dextrose: 18% -Smof lipids: 40 g/L Electrolytes in TPN: Na 76mq/L, K 57m/L, Ca 39m49mL, Mg 39mE639m, and Phos 139mm36m. Cl:Ac 1:1 Add standard MVI and trace elements to TPN conitnue very Sensitive q4h SSI and adjust as needed   -MIVF: LR at 50 mL/hr. F/u 12/18 if still need with TPN now at goal rate (hx of CHF but nephrology wanted to continue  IVF) Monitor TPN labs in am and on Mon/Thurs, and as needed.   KristChinita GreenlandmD Clinical Pharmacist 05/05/2022

## 2022-05-05 NOTE — Progress Notes (Signed)
05/05/2022  Subjective: No acute events overnight.  PICC line was placed and was started on TPN.  Denies any significant abdominal pain and reports still having gas and had more bowel movements yesterday and today.  She still reports feeling when she swallows her saliva or sip of water that there is still some resistance in the high epigastric area.  Denies any significant nausea.  Vital signs: Temp:  [97.7 F (36.5 C)-98.5 F (36.9 C)] 97.8 F (36.6 C) (12/17 0744) Pulse Rate:  [88-91] 88 (12/17 0744) Resp:  [16-20] 20 (12/17 0744) BP: (118-132)/(48-55) 119/48 (12/17 0744) SpO2:  [94 %-97 %] 97 % (12/17 0755)   Intake/Output: 12/16 0701 - 12/17 0700 In: 150 [IV Piggyback:150] Out: 300 [Urine:300] Last BM Date : 05/05/22  Physical Exam: Constitutional: No acute distress Abdomen: Soft, nondistended, no significant tenderness to palpation.  Labs:  No results for input(s): "WBC", "HGB", "HCT", "PLT" in the last 72 hours.  Recent Labs    05/04/22 0510 05/05/22 0355  NA 136 137  K 3.9 3.8  CL 101 103  CO2 26 27  GLUCOSE 83 122*  BUN 69* 58*  CREATININE 2.85* 2.31*  CALCIUM 8.7* 8.7*   No results for input(s): "LABPROT", "INR" in the last 72 hours.  Imaging: No results found.  Assessment/Plan: This is a 71 y.o. female with small bowel obstruction.  - Patient continues to improve from abdominal standpoint having more bowel function yesterday and today without any significant abdominal pain.  Discussed with her again the plan per Dr. Lysle Vyla to keep her n.p.o. over the weekend to allow for more of the bowels to fully decompressed prior to starting her on a diet again versus proceeding with definitive surgery. - Continue n.p.o today, and continue TPN.. - Ambulate as tolerated   I spent 35 minutes dedicated to the care of this patient on the date of this encounter to include pre-visit review of records, face-to-face time with the patient discussing diagnosis and management,  and any post-visit coordination of care.  Melvyn Neth, [facility]

## 2022-05-05 NOTE — Progress Notes (Signed)
Progress Note   Patient: Samantha Mason [id number]:[id number] DOB: 1950-11-22 DOA: 04/26/2022     9 DOS: the patient was seen and examined on 05/05/2022   Brief hospital course: Taken from prior note.  AASIA PEAVLER is a 71 y.o. female with medical history significant for diastolic CHF, and COPD with chronic respiratory failure on 2 L nasal cannula as well as obstructive sleep apnea on CPAP, stage IIIb chronic kidney disease, hypertension and morbid obesity who presents to the ED with a 3-day history of crampy upper abdominal pain and a 1 day history of nonbloody nonbilious vomiting.  She denies fever or chills, diarrhea or dysuria.  Denies chest pain or shortness of breath. ED course and data review: BP 160/83 with pulse of 103 and otherwise normal vitals.  Labs with WBC 15,200.  Sodium 130, creatinine2.29, up from baseline of 1.96.  Lipase and LFTs within normal limits.  CT abdomen and pelvis showing SBO as described below: IMPRESSION: 1. Small-bowel obstruction with transition zone in the ileum. Specific etiology is not identified. 2. Aortic atherosclerosis.  Patient was admitted for SBO, general surgery was consulted and they advised conservative management.  Patient continued to refuse NG tube placement.  Worsening renal function. Nephrology was also consulted  12/12: Overnight patient became febrile with altered mental status.  Sepsis workup and protocol initiated by overnight provider.  No obvious source of infection so far.  CT head was negative for any acute intracranial abnormality.  Labs with worsening leukocytosis, creatinine and procalcitonin at 1.99.  She received fluid boluses and started on broad-spectrum antibiotics which include cefepime, Flagyl and vancomycin. Discontinuing vancomycin due to worsening renal function and no current diagnosis of MRSA Switching cefepime and Flagyl with Zosyn. Blood cultures still pending. CT abdomen and pelvis ordered-still pending. Worsening  renal function which continued to get worse despite getting some IV fluid. Might need starting of dialysis, concern of ATN.  Nephrology is on board.  12/13: Mild tachycardia and low 100s, leukocytosis resolved, hemoglobin stable, renal function with a slight improvement of creatinine to 4.96 and GFR of 9 from 8.  Patient started getting BM and passing flatus, nausea and vomiting improved today. CT abdomen with concern of worsening SBO with transition point in the anterior mid abdomen, likely due to adhesion.  No other significant abnormality-awaiting surgery team response while restarting clear liquid diet.  12/14: Hemodynamically stable, renal functions continue to improve with creatinine of 3.8 today and GFR improved to 12.  Diet was advanced to full liquid.  Hypomagnesemia resolved.  Surgery would like to monitor for another day.  12/15: Patient again developed nausea and vomiting so she was made n.p.o. by surgery.  Repeat abdominal x-ray with persistent small bowel dilation consistent with obstruction.  Having bowel movements and passing flatus.  Surgery advised n.p.o. and TPN.  Patient currently refusing PICC line placement and TPN and would like to continue trying p.o. she was also refusing surgery saying that she does not want to take any risk. Difficult patient as she is refusing most of the medical care advised. Discussed with her son and he will try discussing with her as it is just prolonging her length of stay without any meaningful intervention or help. Renal functions continue to improve, creatinine at 3.18 with GFR improved to 15 today.  12/16: Hemodynamically stable.  Renal functions continue to improve with creatinine at 2.85, and GFR of 17.  Electrolytes within normal limit.  Agreed to proceed with PICC line for TPN.  She is having bowel movement but continued to have nausea.  She would like to wait and still not ready for any surgery.  12/17: Hemodynamically stable, renal function  continued to improve and appears to be at baseline now with creatinine of 2.31.  Nephrology is recommending continuation of LR at this time.  She was started on TPN.  Continue to have bowel movements and passing flatus, nausea and vomiting improving.  Surgery will continue keeping her n.p.o. for bowel rest.   Assessment and Plan: * SBO (small bowel obstruction) ([place]) CT showing small bowel obstruction with transition zone in the ileum Patient repeatedly refused NG tube placement ordered by ED after an initial unsuccessful attempt General surgery was consulted and they were recommending conservative management initially and then advising surgery as she did not improve after many days and repeat imaging with persistent bowel obstruction. Repeat CT abdomen with worsening of SBO and transition point, concern of adhesion.  Unable to tolerate full liquid diet so she was made n.p.o. again. Patient currently refusing surgery,  TPN was started and surgery is planning to keep her n.p.o. to give her a bowel rest.  Continue to have bowel function with passing flatus and BM, clinically seems improving. -Continue with n.p.o. -Continue with TPN -Appreciate surgery help  AMS (altered mental status) Resolved Patient transiently became altered and febrile overnight.  Worsening leukocytosis, has been resolved Chest x-ray negative for pneumonia or pulmonary edema. CT head negative for any acute intracranial abnormality. Procalcitonin elevated at 1.11.  UA with microscopic hematuria. Urine cultures with Enterococcus faecalis and blood cultures negative so far.  CT abdomen with worsening of SBO and no other acute abnormality.  She was given IV fluid and started on broad-spectrum antibiotics. -Continue Zosyn--completed total of 7 days course  CKD (chronic kidney disease) stage 4, GFR 15-29 ml/min (HCC) AKI with CKD stage IV. Renal function continued to improve with creatinine currently at 2.31 with baseline  around 2.29. Nephrology was also consulted.  No contrast exposure.  Concern of ATN. -Nephrology would like to continue IV fluid -Monitor renal function -Avoid nephrotoxins  Chronic diastolic CHF (congestive heart failure) (HCC) Clinically euvolemic to dry Monitor for fluid overload in view of IV hydration with as needed IV Lasix Daily weights  Essential hypertension Blood pressure within goal Hydralazine IV as needed for elevated blood pressure  -Home antihypertensives are being currently held due to AKI and SBO  Chronic respiratory failure with hypoxia and hypercapnia (HCC) O2 requirement at baseline Continue supplemental oxygen  Obstructive lung disease (generalized) ([place]) Not acutely exacerbated Continue Pulmicort twice daily with DuoNebs as needed  Morbid obesity with BMI of 50.0-59.9, adult (HCC) Estimated body mass index is 61.31 kg/m as calculated from the following:   Height as of this encounter: '5\' 1"'$  (1.549 m).   Weight as of this encounter: 147.8 kg.   -Complicating factor to overall prognosis and care  OSA  CPAP nightly   Subjective: Patient is feeling little improved.  Currently on TPN.  Denies any more nausea or vomiting.  Having bowel movement and passing flatus.  Physical Exam: Vitals:   05/04/22 1922 05/05/22 0526 05/05/22 0744 05/05/22 0755  BP: (!) 132/51 (!) 124/51 (!) 119/48   Pulse: 91 88 88   Resp: '18 20 20   '$ Temp: 98.5 F (36.9 C) 98.2 F (36.8 C) 97.8 F (36.6 C)   TempSrc: Oral     SpO2: 94% 97% 96% 97%  Weight:      Height:  General.  Morbidly obese lady, in no acute distress. Pulmonary.  Lungs clear bilaterally, normal respiratory effort. CV.  Regular rate and rhythm, no JVD, rub or murmur. Abdomen.  Soft, nontender, nondistended, BS positive. CNS.  Alert and oriented .  No focal neurologic deficit. Extremities.  No edema, no cyanosis, pulses intact and symmetrical. Psychiatry.  Judgment and insight appears normal.       Data Reviewed: Prior data reviewed  Family Communication: Discussed with son at bedside  Disposition: Status is: Inpatient Remains inpatient appropriate because: Severity of illness-patient is refusing most of the medical care suggested which is resulted in prolonged length of stay.  Planned Discharge Destination: Home with home health  DVT prophylaxis.  Heparin subcu Time spent: 39 minutes  This record has been created using Systems analyst. Errors have been sought and corrected,but may not always be located. Such creation errors do not reflect on the standard of care.   Author: Lorella Nimrod, MD 05/05/2022 2:22 PM  For on call review [email or web address].

## 2022-05-05 NOTE — Plan of Care (Signed)

## 2022-05-06 ENCOUNTER — Inpatient Hospital Stay: Payer: Medicare Other

## 2022-05-06 DIAGNOSIS — K56609 Unspecified intestinal obstruction, unspecified as to partial versus complete obstruction: Secondary | ICD-10-CM | POA: Diagnosis not present

## 2022-05-06 LAB — COMPREHENSIVE METABOLIC PANEL
ALT: 15 U/L (ref 0–44)
AST: 18 U/L (ref 15–41)
Albumin: 2.5 g/dL — ABNORMAL LOW (ref 3.5–5.0)
Alkaline Phosphatase: 59 U/L (ref 38–126)
Anion gap: 8 (ref 5–15)
BUN: 53 mg/dL — ABNORMAL HIGH (ref 8–23)
CO2: 27 mmol/L (ref 22–32)
Calcium: 8.8 mg/dL — ABNORMAL LOW (ref 8.9–10.3)
Chloride: 104 mmol/L (ref 98–111)
Creatinine, Ser: 1.93 mg/dL — ABNORMAL HIGH (ref 0.44–1.00)
GFR, Estimated: 28 mL/min — ABNORMAL LOW (ref >60)
Glucose, Bld: 131 mg/dL — ABNORMAL HIGH (ref 70–99)
Potassium: 4 mmol/L (ref 3.5–5.1)
Sodium: 139 mmol/L (ref 135–145)
Total Bilirubin: 0.3 mg/dL (ref 0.3–1.2)
Total Protein: 5.8 g/dL — ABNORMAL LOW (ref 6.5–8.1)

## 2022-05-06 LAB — GLUCOSE, CAPILLARY
Glucose-Capillary: 109 mg/dL — ABNORMAL HIGH (ref 70–99)
Glucose-Capillary: 117 mg/dL — ABNORMAL HIGH (ref 70–99)
Glucose-Capillary: 129 mg/dL — ABNORMAL HIGH (ref 70–99)
Glucose-Capillary: 133 mg/dL — ABNORMAL HIGH (ref 70–99)
Glucose-Capillary: 138 mg/dL — ABNORMAL HIGH (ref 70–99)
Glucose-Capillary: 149 mg/dL — ABNORMAL HIGH (ref 70–99)
Glucose-Capillary: 161 mg/dL — ABNORMAL HIGH (ref 70–99)

## 2022-05-06 LAB — MAGNESIUM: Magnesium: 1.8 mg/dL (ref 1.7–2.4)

## 2022-05-06 LAB — TRIGLYCERIDES: Triglycerides: 105 mg/dL (ref <150)

## 2022-05-06 LAB — PHOSPHORUS: Phosphorus: 2.4 mg/dL — ABNORMAL LOW (ref 2.5–4.6)

## 2022-05-06 MED ORDER — IPRATROPIUM-ALBUTEROL 0.5-2.5 (3) MG/3ML IN SOLN
3.0000 mL | Freq: Once | RESPIRATORY_TRACT | Status: AC
  Administered 2022-05-06: 3 mL via RESPIRATORY_TRACT
  Filled 2022-05-06: qty 3

## 2022-05-06 MED ORDER — IPRATROPIUM-ALBUTEROL 0.5-2.5 (3) MG/3ML IN SOLN
3.0000 mL | Freq: Four times a day (QID) | RESPIRATORY_TRACT | Status: DC | PRN
  Administered 2022-05-06 – 2022-05-14 (×9): 3 mL via RESPIRATORY_TRACT
  Filled 2022-05-06 (×9): qty 3

## 2022-05-06 MED ORDER — TRACE MINERALS CU-MN-SE-ZN 300-55-60-3000 MCG/ML IV SOLN
INTRAVENOUS | Status: AC
  Filled 2022-05-06: qty 896

## 2022-05-06 MED ORDER — ALBUMIN HUMAN 25 % IV SOLN
25.0000 g | Freq: Once | INTRAVENOUS | Status: AC
  Administered 2022-05-06: 25 g via INTRAVENOUS
  Filled 2022-05-06: qty 100

## 2022-05-06 MED ORDER — FUROSEMIDE 10 MG/ML IJ SOLN
40.0000 mg | Freq: Once | INTRAMUSCULAR | Status: AC
  Administered 2022-05-06: 40 mg via INTRAVENOUS
  Filled 2022-05-06: qty 4

## 2022-05-06 MED ORDER — ROPINIROLE HCL 1 MG PO TABS
1.0000 mg | ORAL_TABLET | Freq: Three times a day (TID) | ORAL | Status: DC
  Administered 2022-05-06 – 2022-05-14 (×23): 1 mg via ORAL
  Filled 2022-05-06 (×23): qty 1

## 2022-05-06 MED ORDER — ENSURE ENLIVE PO LIQD
237.0000 mL | Freq: Three times a day (TID) | ORAL | Status: DC
  Administered 2022-05-07 (×3): 237 mL via ORAL

## 2022-05-06 NOTE — Progress Notes (Signed)
PHARMACY - TOTAL PARENTERAL NUTRITION CONSULT NOTE   Indication:  small bowel obstruction  Patient Measurements: Height: '5\' 1"'$  (154.9 cm) Weight: (!) 151.1 kg (333 lb 1.8 oz) IBW/kg (Calculated) : 47.8 TPN AdjBW (KG): 73 Body mass index is 62.94 kg/m. Usual Weight: 151 kg  Assessment: 71 year old female with PMH dCHF, COPD on 2L PTA, OSA on CPAP, CKD3b, HTN, and morbid obesity admitted with small bowel obstruction unable to tolerate diet. Surgery planning for possible exploratory laparatomy.  TPN while awaiting surgery and need for decompressed bowel   Glucose / Insulin: CBGs 112 - 149 Electrolytes:  Lytes WNL. Renal: Scr trending down: 2.85>>1.93 Hepatic: LFTs wnl TG: wnl Intake / Output; MIVF:  GI Imaging: GI Surgeries / Procedures:    Central access: PICC 12/16 TPN start date: started TPN 12/16  Nutritional Goals: Goal TPN rate is 80 mL/hr (provides 134 g of protein and 2480 kcals per day)  Kcal:  2300-2600kcal/day Protein: 115-130g/day Fluid:  1.4-1.6L/day  BMI=62.9  Nutritional components at goal rate: Clinisol: 70 g/L Dextrose: 18% Smof lipids: 40 g/L   RD Assessment: Estimated Needs Total Energy Estimated Needs: 2300-2600kcal/day Total Protein Estimated Needs: 115-130g/day Total Fluid Estimated Needs: 1.4-1.6L/day  Current Nutrition:  NPO  Plan:  ---continue TPN to goal rate of 80 mL/hr  ---Electrolytes in TPN: Na 531mq/L, K 558m/L, Ca 31m49mL, Mg 31mE69m, and Phos 131mm10m. Cl:Ac 1:1 ---Add standard MVI and trace elements to TPN ---continue very Sensitive q4h SSI and adjust as needed  ---Monitor TPN labs in am and on Mon/Thurs, and as needed.   RodneVallery SarmD, BCPS Clinical Pharmacist 05/06/2022

## 2022-05-06 NOTE — TOC Progression Note (Signed)
Transition of Care [facility]) - Progression Note    Patient Details  Name: Samantha Mason MRN: [id number] Date of Birth: Sep 11, 1950  Transition of [facility]) CM/SW Contact  Beverly Sessions, RN Phone Number: 05/06/2022, 3:35 PM  Clinical Narrative:      Per Surgeon plan to wean TPN prior to discharge.  Corene Cornea with [facility] notified       Expected Discharge Plan and Services                                                 Social Determinants of Health (SDOH) Interventions    Readmission Risk Interventions     No data to display

## 2022-05-06 NOTE — Progress Notes (Signed)
[facility]  PROGRESS NOTE   Subjective:   Patient seen sitting up in bed, alert and oriented Remains n.p.o., receiving TPN Complains of bilateral foot soreness, increased lower extremity edema Remains on room air  Objective:  Vital signs: Blood pressure (!) 137/48, pulse 87, temperature (!) 97.5 F (36.4 C), temperature source Oral, resp. rate 16, height '5\' 1"'$  (1.549 m), weight (!) 151.1 kg, SpO2 99 %. No intake or output data in the 24 hours ending 05/06/22 1451  Filed Weights   04/27/22 0520 04/29/22 0650 05/03/22 0404  Weight: (!) 148.8 kg (!) 147.2 kg (!) 151.1 kg     Physical Exam: General:  No acute distress  Head:  Normocephalic, atraumatic. Moist oral mucosal membranes  Eyes:  Anicteric  Lungs:   Clear to auscultation, normal effort  Heart:  S1S2 no rubs  Abdomen:   Soft  Extremities: 1+ peripheral edema.  Neurologic:  Awake, alert, following commands  Skin:  No lesions  Access: None    Basic Metabolic Panel: Recent Labs  Lab 05/02/22 0559 05/03/22 0631 05/04/22 0510 05/05/22 0355 05/06/22 0325  NA 134* 133* 136 137 139  K 4.1 4.2 3.9 3.8 4.0  CL 98 98 101 103 104  CO2 '25 24 26 27 27  '$ GLUCOSE 101* 101* 83 122* 131*  BUN 83* 71* 69* 58* 53*  CREATININE 3.80* 3.18* 2.85* 2.31* 1.93*  CALCIUM 8.7* 8.8* 8.7* 8.7* 8.8*  MG 1.9 1.9 1.8 2.2 1.8  PHOS 3.2 3.4 2.7 2.7 2.4*     CBC: Recent Labs  Lab 04/30/22 0002 04/30/22 0418 05/01/22 0206 05/02/22 0559  WBC 15.3* 21.0* 8.6 7.6  HGB 11.0* 10.0* 10.3* 9.6*  HCT 35.2* 32.2* 34.3* 30.6*  MCV 78.9* 78.3* 80.1 78.3*  PLT 357 357 344 315      Urinalysis: No results for input(s): "COLORURINE", "LABSPEC", "PHURINE", "GLUCOSEU", "HGBUR", "BILIRUBINUR", "KETONESUR", "PROTEINUR", "UROBILINOGEN", "NITRITE", "LEUKOCYTESUR" in the last 72 hours.  Invalid input(s): "APPERANCEUR"    Imaging: DG Abd 1 View  Result Date: 05/06/2022 CLINICAL DATA:  Small bowel obstruction.[id number]. EXAM:  ABDOMEN - 1 VIEW COMPARISON:  05/03/2022 FINDINGS: There is persistent dilatation of small bowel loops in the central abdomen, similar in appearance to prior study. Relative normal appearance of the large bowel loops. Surgical clips are noted in the RIGHT UPPER QUADRANT of the abdomen. No evidence for free intraperitoneal air. IMPRESSION: Persistent dilatation of small bowel loops in the central abdomen compatible with small-bowel obstruction. Electronically Signed   By: Nolon Nations M.D.   On: 05/06/2022 09:00     Medications:    piperacillin-tazobactam (ZOSYN)  IV 3.375 g (05/06/22 0543)   promethazine (PHENERGAN) injection (IM or IVPB) Stopped (05/04/22 1502)   TPN ADULT (ION) 80 mL/hr at 05/05/22 1725   TPN ADULT (ION)      budesonide  0.5 mg Nebulization BID   Chlorhexidine Gluconate Cloth  6 each Topical Daily   furosemide  40 mg Intravenous Once   insulin aspart  0-6 Units Subcutaneous Q4H   ipratropium-albuterol  3 mL Nebulization BID   multivitamin with minerals  1 tablet Oral Daily   pantoprazole (PROTONIX) IV  40 mg Intravenous Q12H   sodium chloride flush  10-40 mL Intracatheter Q12H    Assessment/ Plan:     Principal Problem:   SBO (small bowel obstruction) (HCC) Active Problems:   Morbid obesity with BMI of 50.0-59.9, adult (HCC)   OSA    Essential hypertension   Obstructive lung disease (generalized) (  [place])   Chronic diastolic CHF (congestive heart failure) (HCC)   Chronic respiratory failure with hypoxia and hypercapnia (HCC)   CKD (chronic kidney disease) stage 4, GFR 15-29 ml/min (HCC)   AMS (altered mental status)  Samantha Mason is a 71 year old female with past medical history including COPD with renal failure requiring 2 L oxygen as needed, chronic diastolic heart failure, sleep apnea, morbid obesity, hypertension, and chronic kidney disease stage IV. Patient presents to the emergency department complaining of abdominal pain and has been admitted for Small  bowel obstruction ([place]) [K56.609] SBO (small bowel obstruction) ([place]) [K56.609]   Patient is known to our practice and is followed by Dr Holley Raring outpatient. She was last seen in office on 04/17/22.   #1: Acute kidney injury on chronic kidney disease stage IV: No urine output recorded.  Creatinine improved, 1.9 today.  Will offer one-time dose IV furosemide 40 mg to manage lower extremity edema.  Will also order IV albumin 25 g to optimize fluid removal with Lasix.  Due to presence of TPN, will stop LR infusion.   #2: Anemia of chronic kidney disease: Hemoglobin acceptable at last check.  Will obtain updated labs in AM.   #3: Congestive heart failure: Remains on room air.  Will offer one-time dose IV furosemide to manage lower extremity edema.   #4: Chronic shortness of breath/chronic respiratory failure: Continue oxygen supplementation along with CPAP at night.   #5: Small bowel obstruction: Slowly but steadily improving.  TPN in place.  Continue IV antibiotics.   LOS: [facility] 12/18/20232:51 PM

## 2022-05-06 NOTE — Progress Notes (Signed)
Nutrition Follow Up Note   DOCUMENTATION CODES:   Morbid obesity  INTERVENTION:   Continue TPN per pharmacy- provides 2480kcal and 134g/day protein   Ensure Enlive po TID, each supplement provides 350 kcal and 20 grams of protein.  Daily weights   NUTRITION DIAGNOSIS:   Inadequate oral intake related to acute illness as evidenced by NPO status. -progressing   GOAL:   Patient will meet greater than or equal to 90% of their needs -met with TPN  MONITOR:   PO intake, Supplement acceptance, Diet advancement, Labs, Weight trends, Skin, I & O's  ASSESSMENT:   71 y/o female with h/o morbid obesity, OSA, HTN, COPD, CHF, CKD IV, GERD, IBS and RLS who is admitted with SBO.  Met with pt in room today. Pt reports that she is feeling better than she did. Pt continues to have SBO which is being medically managed for now. Pt has been intermittently on liquid diet and then NPO again since admission. TPN initiated on 12/15; pt is tolerating well. Refeed labs and triglycerides stable. Per chart, pt appears weight stable since admission. Pt initiated on a full liquid diet today; pt ate some jello. RD will add Ensure supplements (prefers strawberry). Pt did not like the Boost Breeze.    Medications reviewed and include: lasix, insulin, MVI, protonix, zosyn   Labs reviewed: K 4.0 wnl, BUN 53(H), creat 1.93(H), P 2.4(L), Mg 1.8 wnl Triglycerides- 105 Hgb 9.6(L), Hct 30.6(L), MCV 78.3(L), MCH 24.6(L) Cbgs- 161, 138, 133, 149 x 24 hrs   Diet Order:   Diet Order             Diet full liquid Fluid consistency: Thin  Diet effective now                  EDUCATION NEEDS:   Education needs have been addressed  Skin:  Skin Assessment: Reviewed RN Assessment  Last BM:  12/18- type 6  Height:   Ht Readings from Last 1 Encounters:  04/27/22 _0  (1.549 m)    Weight:   Wt Readings from Last 1 Encounters:  05/03/22 (!) 151.1 kg    Ideal Body Weight:  47.7 kg  BMI:  Body mass  index is 62.94 kg/m.  Estimated Nutritional Needs:   Kcal:  2300-2600kcal/day  Protein:  115-130g/day  Fluid:  1.4-1.6L/day  Koleen Distance MS, RD, LDN Please refer to [facility] for RD and/or RD on-call/weekend/after hours pager

## 2022-05-06 NOTE — Progress Notes (Signed)
Subjective:  CC: Samantha Mason is a 71 y.o. female  Hospital stay day 10,   small bowel obstruction  HPI: No acute complaints.  Patient reports continued copious amounts of bowel movements.  She has felt "good as she ever has" since admission.  Tolerating clears  ROS:  General: Denies weight loss, weight gain, fatigue, fevers, chills, and night sweats. Heart: Denies chest pain, palpitations, racing heart, irregular heartbeat, leg pain or swelling, and decreased activity tolerance. Respiratory: Denies breathing difficulty, shortness of breath, wheezing, cough, and sputum. GI: Denies change in appetite, heartburn, nausea, vomiting, constipation, diarrhea, and blood in stool. GU: Denies difficulty urinating, pain with urinating, urgency, frequency, blood in urine.   Objective:   Temp:  [97.5 F (36.4 C)-98.2 F (36.8 C)] 97.5 F (36.4 C) (12/18 0757) Pulse Rate:  [80-87] 87 (12/18 0757) Resp:  [15-20] 16 (12/18 0757) BP: (125-137)/(48-60) 137/48 (12/18 0757) SpO2:  [93 %-99 %] 99 % (12/18 0757) Weight:  [150.1 kg] 150.1 kg (12/18 1606)     Height: '5\' 1"'$  (154.9 cm) Weight: (!) 150.1 kg BMI (Calculated): 62.56   Intake/Output this shift:  No intake or output data in the 24 hours ending 05/06/22 1629   Constitutional :  alert, cooperative, appears stated age, and no distress  Respiratory:  clear to auscultation bilaterally  Cardiovascular:  regular rate and rhythm  Gastrointestinal: Soft, no guarding, no tenderness to palpation today. .   Skin: Cool and moist.   Psychiatric: Normal affect, non-agitated, not confused       LABS:     Latest Ref Rng & Units 05/06/2022    3:25 AM 05/05/2022    3:55 AM 05/04/2022    5:10 AM  CMP  Glucose 70 - 99 mg/dL 131  122  83   BUN 8 - 23 mg/dL 53  58  69   Creatinine 0.44 - 1.00 mg/dL 1.93  2.31  2.85   Sodium 135 - 145 mmol/L 139  137  136   Potassium 3.5 - 5.1 mmol/L 4.0  3.8  3.9   Chloride 98 - 111 mmol/L 104  103  101   CO2 22 -  32 mmol/L '27  27  26   '$ Calcium 8.9 - 10.3 mg/dL 8.8  8.7  8.7   Total Protein 6.5 - 8.1 g/dL 5.8   6.6   Total Bilirubin 0.3 - 1.2 mg/dL 0.3   0.6   Alkaline Phos 38 - 126 U/L 59   71   AST 15 - 41 U/L 18   12   ALT 0 - 44 U/L 15   14       Latest Ref Rng & Units 05/02/2022    5:59 AM 05/01/2022    2:06 AM 04/30/2022    4:18 AM  CBC  WBC 4.0 - 10.5 K/uL 7.6  8.6  21.0   Hemoglobin 12.0 - 15.0 g/dL 9.6  10.3  10.0   Hematocrit 36.0 - 46.0 % 30.6  34.3  32.2   Platelets 150 - 400 K/uL 315  344  357     RADS: CLINICAL DATA:  Small bowel obstruction.[id number].   EXAM: ABDOMEN - 1 VIEW   COMPARISON:  05/03/2022   FINDINGS: There is persistent dilatation of small bowel loops in the central abdomen, similar in appearance to prior study. Relative normal appearance of the large bowel loops. Surgical clips are noted in the RIGHT UPPER QUADRANT of the abdomen. No evidence for free intraperitoneal air.   IMPRESSION:  Persistent dilatation of small bowel loops in the central abdomen compatible with small-bowel obstruction.     Electronically Signed   By: Nolon Nations M.D.   On: 05/06/2022 09:00     Assessment:   Patient again continues to have copious amounts of bowel movements on clear liquid diet with 0 pain.  Explained to her concern for the bowel dilation still noted on KUB and her lack of advancement during the initial attempt.  Patient still hesitant on proceeding with surgery so we will like to try a full liquid diet 1 more time prior to proceeding with surgery.  Will start and monitor closely.  labs/images/medications/previous chart entries reviewed personally and relevant changes/updates noted above.

## 2022-05-06 NOTE — Plan of Care (Signed)
  Problem: Education: Goal: Knowledge of General Education information will improve Description: Including pain rating scale, medication(s)/side effects and non-pharmacologic comfort measures Outcome: Progressing   Problem: Health Behavior/Discharge Planning: Goal: Ability to manage health-related needs will improve Outcome: Progressing   Problem: Clinical Measurements: Goal: Ability to maintain clinical measurements within normal limits will improve Outcome: Progressing Goal: Will remain free from infection Outcome: Progressing Goal: Diagnostic test results will improve Outcome: Progressing Goal: Respiratory complications will improve Outcome: Progressing Goal: Cardiovascular complication will be avoided Outcome: Progressing   Problem: Activity: Goal: Risk for activity intolerance will decrease Outcome: Progressing   Problem: Nutrition: Goal: Adequate nutrition will be maintained Outcome: Progressing   Problem: Coping: Goal: Level of anxiety will decrease Outcome: Progressing   Problem: Elimination: Goal: Will not experience complications related to bowel motility Outcome: Progressing Goal: Will not experience complications related to urinary retention Outcome: Progressing   Problem: Safety: Goal: Ability to remain free from injury will improve Outcome: Progressing   Problem: Skin Integrity: Goal: Risk for impaired skin integrity will decrease Outcome: Progressing   Problem: Fluid Volume: Goal: Hemodynamic stability will improve Outcome: Progressing   Problem: Respiratory: Goal: Ability to maintain adequate ventilation will improve Outcome: Progressing

## 2022-05-06 NOTE — Progress Notes (Signed)
Progress Note   Patient: Samantha Mason GGE:[id number] DOB: 11/16/1950 DOA: 04/26/2022     10 DOS: the patient was seen and examined on 05/06/2022   Brief hospital course: Taken from prior note.  Samantha Mason is a 71 y.o. female with medical history significant for diastolic CHF, and COPD with chronic respiratory failure on 2 L nasal cannula as well as obstructive sleep apnea on CPAP, stage IIIb chronic kidney disease, hypertension and morbid obesity who presents to the ED with a 3-day history of crampy upper abdominal pain and a 1 day history of nonbloody nonbilious vomiting.  She denies fever or chills, diarrhea or dysuria.  Denies chest pain or shortness of breath. ED course and data review: BP 160/83 with pulse of 103 and otherwise normal vitals.  Labs with WBC 15,200.  Sodium 130, creatinine2.29, up from baseline of 1.96.  Lipase and LFTs within normal limits.  CT abdomen and pelvis showing SBO as described below: IMPRESSION: 1. Small-bowel obstruction with transition zone in the ileum. Specific etiology is not identified. 2. Aortic atherosclerosis.  Patient was admitted for SBO, general surgery was consulted and they advised conservative management.  Patient continued to refuse NG tube placement.  Worsening renal function. Nephrology was also consulted  12/12: Overnight patient became febrile with altered mental status.  Sepsis workup and protocol initiated by overnight provider.  No obvious source of infection so far.  CT head was negative for any acute intracranial abnormality.  Labs with worsening leukocytosis, creatinine and procalcitonin at 1.99.  She received fluid boluses and started on broad-spectrum antibiotics which include cefepime, Flagyl and vancomycin. Discontinuing vancomycin due to worsening renal function and no current diagnosis of MRSA Switching cefepime and Flagyl with Zosyn. Blood cultures still pending. CT abdomen and pelvis ordered-still pending. Worsening  renal function which continued to get worse despite getting some IV fluid. Might need starting of dialysis, concern of ATN.  Nephrology is on board.  12/13: Mild tachycardia and low 100s, leukocytosis resolved, hemoglobin stable, renal function with a slight improvement of creatinine to 4.96 and GFR of 9 from 8.  Patient started getting BM and passing flatus, nausea and vomiting improved today. CT abdomen with concern of worsening SBO with transition point in the anterior mid abdomen, likely due to adhesion.  No other significant abnormality-awaiting surgery team response while restarting clear liquid diet.  12/14: Hemodynamically stable, renal functions continue to improve with creatinine of 3.8 today and GFR improved to 12.  Diet was advanced to full liquid.  Hypomagnesemia resolved.  Surgery would like to monitor for another day.  12/15: Patient again developed nausea and vomiting so she was made n.p.o. by surgery.  Repeat abdominal x-ray with persistent small bowel dilation consistent with obstruction.  Having bowel movements and passing flatus.  Surgery advised n.p.o. and TPN.  Patient currently refusing PICC line placement and TPN and would like to continue trying p.o. she was also refusing surgery saying that she does not want to take any risk. Difficult patient as she is refusing most of the medical care advised. Discussed with her son and he will try discussing with her as it is just prolonging her length of stay without any meaningful intervention or help. Renal functions continue to improve, creatinine at 3.18 with GFR improved to 15 today.  12/16: Hemodynamically stable.  Renal functions continue to improve with creatinine at 2.85, and GFR of 17.  Electrolytes within normal limit.  Agreed to proceed with PICC line for TPN.  She is having bowel movement but continued to have nausea.  She would like to wait and still not ready for any surgery.  12/17: Hemodynamically stable, renal function  continued to improve and appears to be at baseline now with creatinine of 2.31.  Nephrology is recommending continuation of LR at this time.  She was started on TPN.  Continue to have bowel movements and passing flatus, nausea and vomiting improving.  Surgery will continue keeping her n.p.o. for bowel rest.  12/18: Patient clinically having bowel movements and feeling some improvement, case to be with persistently dilated bowel, she is still hesitant to proceed with surgery and would like to try a full liquid diet 1 more time before deciding.  Surgery advance her diet to full liquid. Will continue with TPN   Assessment and Plan: * SBO (small bowel obstruction) ([place]) CT showing small bowel obstruction with transition zone in the ileum Patient repeatedly refused NG tube placement ordered by ED after an initial unsuccessful attempt General surgery was consulted and they were recommending conservative management initially and then advising surgery as she did not improve after many days and repeat imaging with persistent bowel obstruction. Repeat CT abdomen with worsening of SBO and transition point, concern of adhesion.  Unable to tolerate full liquid diet so she was made n.p.o. again. Repeat imaging with persistent obstruction, patient would like to try a full liquid diet before thinking about surgery.  So diet was switched to full liquid. -Continue with TPN -Appreciate surgery help  AMS (altered mental status) Resolved Patient transiently became altered and febrile overnight.  Worsening leukocytosis, has been resolved Chest x-ray negative for pneumonia or pulmonary edema. CT head negative for any acute intracranial abnormality. Procalcitonin elevated at 1.11.  UA with microscopic hematuria. Urine cultures with Enterococcus faecalis and blood cultures negative so far.  CT abdomen with worsening of SBO and no other acute abnormality.  She was given IV fluid and started on broad-spectrum  antibiotics. -Continue Zosyn--completed total of 7 days course  CKD (chronic kidney disease) stage 4, GFR 15-29 ml/min (HCC) AKI with CKD stage IV. Renal function continued to improve with creatinine currently at 2.31 with baseline around 2.29. Nephrology was also consulted.  No contrast exposure.  Concern of ATN. -Nephrology would like to continue IV fluid -Monitor renal function -Avoid nephrotoxins  Chronic diastolic CHF (congestive heart failure) (HCC) Clinically euvolemic to dry Monitor for fluid overload in view of IV hydration with as needed IV Lasix Daily weights  Essential hypertension Blood pressure within goal Hydralazine IV as needed for elevated blood pressure  -Home antihypertensives are being currently held due to AKI and SBO  Chronic respiratory failure with hypoxia and hypercapnia (HCC) O2 requirement at baseline Continue supplemental oxygen  Obstructive lung disease (generalized) ([place]) Not acutely exacerbated Continue Pulmicort twice daily with DuoNebs as needed  Morbid obesity with BMI of 50.0-59.9, adult (HCC) Estimated body mass index is 61.31 kg/m as calculated from the following:   Height as of this encounter: '5\' 1"'$  (1.549 m).   Weight as of this encounter: 703.5 kg.   -Complicating factor to overall prognosis and care  OSA  CPAP nightly   Subjective: Patient continued to have bowel movements and passing flatus.  She wants to have full liquid diet.  Still not ready for surgery.  Physical Exam: Vitals:   05/06/22 0323 05/06/22 0529 05/06/22 0757 05/06/22 1606  BP: (!) 128/56 (!) 125/57 (!) 137/48   Pulse: 82 84 87   Resp: 20  15 16   Temp: 98 F (36.7 C) 97.8 F (36.6 C) (!) 97.5 F (36.4 C)   TempSrc: Oral Oral Oral   SpO2: 99% 93% 99%   Weight:    (!) 150.1 kg  Height:       General.  Morbidly obese lady, in no acute distress. Pulmonary.  Lungs clear bilaterally, normal respiratory effort. CV.  Regular rate and rhythm, no JVD, rub or  murmur. Abdomen.  Soft, nontender, nondistended, BS positive. CNS.  Alert and oriented .  No focal neurologic deficit. Extremities.  No edema, no cyanosis, pulses intact and symmetrical. Psychiatry.  Judgment and insight appears normal. .      Data Reviewed: Prior data reviewed  Family Communication:   Disposition: Status is: Inpatient Remains inpatient appropriate because: Severity of illness-patient is refusing most of the medical care suggested which is resulted in prolonged length of stay.  Planned Discharge Destination: Home with home health  DVT prophylaxis.  Heparin subcu Time spent: 40 minutes  This record has been created using Systems analyst. Errors have been sought and corrected,but may not always be located. Such creation errors do not reflect on the standard of care.   Author: Lorella Nimrod, MD 05/06/2022 5:06 PM  For on call review [email or web address].

## 2022-05-06 NOTE — Telephone Encounter (Signed)
Patient remains in the hospital.  Will check status again tomorrow.

## 2022-05-07 DIAGNOSIS — K56609 Unspecified intestinal obstruction, unspecified as to partial versus complete obstruction: Secondary | ICD-10-CM | POA: Diagnosis not present

## 2022-05-07 LAB — BASIC METABOLIC PANEL
Anion gap: 6 (ref 5–15)
BUN: 57 mg/dL — ABNORMAL HIGH (ref 8–23)
CO2: 28 mmol/L (ref 22–32)
Calcium: 9.2 mg/dL (ref 8.9–10.3)
Chloride: 106 mmol/L (ref 98–111)
Creatinine, Ser: 1.69 mg/dL — ABNORMAL HIGH (ref 0.44–1.00)
GFR, Estimated: 32 mL/min — ABNORMAL LOW (ref >60)
Glucose, Bld: 127 mg/dL — ABNORMAL HIGH (ref 70–99)
Potassium: 4 mmol/L (ref 3.5–5.1)
Sodium: 140 mmol/L (ref 135–145)

## 2022-05-07 LAB — GLUCOSE, CAPILLARY
Glucose-Capillary: 113 mg/dL — ABNORMAL HIGH (ref 70–99)
Glucose-Capillary: 125 mg/dL — ABNORMAL HIGH (ref 70–99)
Glucose-Capillary: 125 mg/dL — ABNORMAL HIGH (ref 70–99)
Glucose-Capillary: 128 mg/dL — ABNORMAL HIGH (ref 70–99)
Glucose-Capillary: 129 mg/dL — ABNORMAL HIGH (ref 70–99)
Glucose-Capillary: 130 mg/dL — ABNORMAL HIGH (ref 70–99)
Glucose-Capillary: 137 mg/dL — ABNORMAL HIGH (ref 70–99)

## 2022-05-07 LAB — PHOSPHORUS: Phosphorus: 2.3 mg/dL — ABNORMAL LOW (ref 2.5–4.6)

## 2022-05-07 LAB — MAGNESIUM: Magnesium: 1.8 mg/dL (ref 1.7–2.4)

## 2022-05-07 MED ORDER — SODIUM PHOSPHATES 45 MMOLE/15ML IV SOLN
15.0000 mmol | Freq: Once | INTRAVENOUS | Status: AC
  Administered 2022-05-07: 15 mmol via INTRAVENOUS
  Filled 2022-05-07: qty 5

## 2022-05-07 MED ORDER — MAGNESIUM SULFATE 2 GM/50ML IV SOLN
2.0000 g | Freq: Once | INTRAVENOUS | Status: AC
  Administered 2022-05-07: 2 g via INTRAVENOUS
  Filled 2022-05-07: qty 50

## 2022-05-07 MED ORDER — TRACE MINERALS CU-MN-SE-ZN 300-55-60-3000 MCG/ML IV SOLN
INTRAVENOUS | Status: AC
  Filled 2022-05-07: qty 896

## 2022-05-07 NOTE — Progress Notes (Signed)
PHARMACY - TOTAL PARENTERAL NUTRITION CONSULT NOTE   Indication:  small bowel obstruction  Patient Measurements: Height: '5\' 1"'$  (154.9 cm) Weight: (!) 150.1 kg (330 lb 14.6 oz) IBW/kg (Calculated) : 47.8 TPN AdjBW (KG): 73 Body mass index is 62.53 kg/m. Usual Weight: 151 kg  Assessment: 71 year old female with PMH dCHF, COPD on 2L PTA, OSA on CPAP, CKD3b, HTN, and morbid obesity admitted with small bowel obstruction unable to tolerate diet. Surgery planning for possible exploratory laparatomy.  TPN while awaiting surgery and need for decompressed bowel   Glucose / Insulin: CBGs 109-161 1 Unit SSI/24 hrs Electrolytes: Phos 2.3   Mag 1.8 Renal: Scr trending down: 2.85>>1.93>>1.69 Hepatic: LFTs wnl TG: wnl Intake / Output; MIVF:  GI Imaging: GI Surgeries / Procedures:    Central access: PICC 12/16 TPN start date: started TPN 12/16  Nutritional Goals: Goal TPN rate is 80 mL/hr (provides 134 g of protein and 2480 kcals per day)  Kcal:  2300-2600kcal/day Protein: 115-130g/day Fluid:  1.4-1.6L/day  BMI=62.9  Nutritional components at goal rate: Clinisol: 70 g/L Dextrose: 18% Smof lipids: 40 g/L   RD Assessment: Estimated Needs Total Energy Estimated Needs: 2300-2600kcal/day Total Protein Estimated Needs: 115-130g/day Total Fluid Estimated Needs: 1.4-1.6L/day  Current Nutrition:  Full liquid  Plan:  ---continue TPN to goal rate of 80 mL/hr  ---Electrolytes in TPN: Na 18mq/L, K 529m/L, Ca 5m75mL, Mg 5mE32m, and Phos 15mm46m. Cl:Ac 1:1 -lasix x 1 on 12/18 --Phos 2.3  (K 4.0, Na 140) Will order Sodium Phos 15 mmol IV x 1 --Mag 1.8    Will order Magnesium sulfate 2 gm IV x1 ---Add standard MVI and trace elements to TPN ---continue very Sensitive q4h SSI and adjust as needed  ---Monitor TPN labs in am and on Mon/Thurs, and as needed.   KristChinita GreenlandmD Clinical Pharmacist 05/07/2022

## 2022-05-07 NOTE — Progress Notes (Signed)
Subjective:  CC: Samantha Mason is a 71 y.o. female  Hospital stay day 11,   small bowel obstruction  HPI: No acute complaints.  One very brief episode of nausea, resolved without any issue. Still having BM  ROS:  General: Denies weight loss, weight gain, fatigue, fevers, chills, and night sweats. Heart: Denies chest pain, palpitations, racing heart, irregular heartbeat, leg pain or swelling, and decreased activity tolerance. Respiratory: Denies breathing difficulty, shortness of breath, wheezing, cough, and sputum. GI: Denies change in appetite, heartburn, nausea, vomiting, constipation, diarrhea, and blood in stool. GU: Denies difficulty urinating, pain with urinating, urgency, frequency, blood in urine.   Objective:   Temp:  [97.7 F (36.5 C)-98.1 F (36.7 C)] 97.9 F (36.6 C) (12/19 1700) Pulse Rate:  [86-102] 102 (12/19 1700) Resp:  [18-20] 18 (12/19 1700) BP: (133-159)/(55-74) 141/61 (12/19 1700) SpO2:  [93 %-97 %] 95 % (12/19 1700) FiO2 (%):  [21 %] 21 % (12/18 2128)     Height: '5\' 1"'$  (154.9 cm) Weight: (!) 150.1 kg BMI (Calculated): 62.56   Intake/Output this shift:   Intake/Output Summary (Last 24 hours) at 05/07/2022 1754 Last data filed at 05/07/2022 1323 Gross per 24 hour  Intake 1901.4 ml  Output 1500 ml  Net 401.4 ml     Constitutional :  alert, cooperative, appears stated age, and no distress  Respiratory:  clear to auscultation bilaterally  Cardiovascular:  regular rate and rhythm  Gastrointestinal: Soft, no guarding, no tenderness to palpation today. .   Skin: Cool and moist.   Psychiatric: Normal affect, non-agitated, not confused       LABS:     Latest Ref Rng & Units 05/07/2022    6:43 AM 05/06/2022    3:25 AM 05/05/2022    3:55 AM  CMP  Glucose 70 - 99 mg/dL 127  131  122   BUN 8 - 23 mg/dL 57  53  58   Creatinine 0.44 - 1.00 mg/dL 1.69  1.93  2.31   Sodium 135 - 145 mmol/L 140  139  137   Potassium 3.5 - 5.1 mmol/L 4.0  4.0  3.8    Chloride 98 - 111 mmol/L 106  104  103   CO2 22 - 32 mmol/L '28  27  27   '$ Calcium 8.9 - 10.3 mg/dL 9.2  8.8  8.7   Total Protein 6.5 - 8.1 g/dL  5.8    Total Bilirubin 0.3 - 1.2 mg/dL  0.3    Alkaline Phos 38 - 126 U/L  59    AST 15 - 41 U/L  18    ALT 0 - 44 U/L  15        Latest Ref Rng & Units 05/02/2022    5:59 AM 05/01/2022    2:06 AM 04/30/2022    4:18 AM  CBC  WBC 4.0 - 10.5 K/uL 7.6  8.6  21.0   Hemoglobin 12.0 - 15.0 g/dL 9.6  10.3  10.0   Hematocrit 36.0 - 46.0 % 30.6  34.3  32.2   Platelets 150 - 400 K/uL 315  344  357     RADS: CLINICAL DATA:  Small bowel obstruction.[id number].   EXAM: ABDOMEN - 1 VIEW   COMPARISON:  05/03/2022   FINDINGS: There is persistent dilatation of small bowel loops in the central abdomen, similar in appearance to prior study. Relative normal appearance of the large bowel loops. Surgical clips are noted in the RIGHT UPPER QUADRANT of the abdomen. No  evidence for free intraperitoneal air.   IMPRESSION: Persistent dilatation of small bowel loops in the central abdomen compatible with small-bowel obstruction.     Electronically Signed   By: Nolon Nations M.D.   On: 05/06/2022 09:00     Assessment:   Patient continues to report copious amounts of bowel movements on full liquid diet today.  Will monitor overnight, possible advancement to regular tomorrow.  labs/images/medications/previous chart entries reviewed personally and relevant changes/updates noted above.

## 2022-05-07 NOTE — Care Management Important Message (Signed)
Important Message  Patient Details  Name: Samantha Mason MRN: [id number] Date of Birth: [birth year]   Medicare Important Message Given:  Yes     [name] 05/07/2022, 11:46 AM

## 2022-05-07 NOTE — Progress Notes (Signed)
[facility]  PROGRESS NOTE   Subjective:   Patient seen sitting at side of bed, alert and oriented Currently complaining of mild nausea States she ate cream of wheat and Jell-O Continues to complain of swelling and tightness in lower extremities Denies shortness of breath  Objective:  Vital signs: Blood pressure (!) 159/74, pulse 86, temperature 98 F (36.7 C), temperature source Oral, resp. rate 18, height '5\' 1"'$  (1.549 m), weight (!) 150.1 kg, SpO2 94 %.  Intake/Output Summary (Last 24 hours) at 05/07/2022 1432 Last data filed at 05/07/2022 1323 Gross per 24 hour  Intake 1901.4 ml  Output 1500 ml  Net 401.4 ml    Filed Weights   04/29/22 0650 05/03/22 0404 05/06/22 1606  Weight: (!) 147.2 kg (!) 151.1 kg (!) 150.1 kg     Physical Exam: General:  No acute distress  Head:  Normocephalic, atraumatic. Moist oral mucosal membranes  Eyes:  Anicteric  Lungs:   Clear to auscultation, normal effort  Heart:  S1S2 no rubs  Abdomen:   Soft, obese  Extremities: 1+ peripheral edema.  Bilateral Ace wraps  Neurologic:  Awake, alert, following commands  Skin:  No lesions  Access: None    Basic Metabolic Panel: Recent Labs  Lab 05/03/22 0631 05/04/22 0510 05/05/22 0355 05/06/22 0325 05/07/22 0643  NA 133* 136 137 139 140  K 4.2 3.9 3.8 4.0 4.0  CL 98 101 103 104 106  CO2 '24 26 27 27 28  '$ GLUCOSE 101* 83 122* 131* 127*  BUN 71* 69* 58* 53* 57*  CREATININE 3.18* 2.85* 2.31* 1.93* 1.69*  CALCIUM 8.8* 8.7* 8.7* 8.8* 9.2  MG 1.9 1.8 2.2 1.8 1.8  PHOS 3.4 2.7 2.7 2.4* 2.3*     CBC: Recent Labs  Lab 05/01/22 0206 05/02/22 0559  WBC 8.6 7.6  HGB 10.3* 9.6*  HCT 34.3* 30.6*  MCV 80.1 78.3*  PLT 344 315      Urinalysis: No results for input(s): "COLORURINE", "LABSPEC", "PHURINE", "GLUCOSEU", "HGBUR", "BILIRUBINUR", "KETONESUR", "PROTEINUR", "UROBILINOGEN", "NITRITE", "LEUKOCYTESUR" in the last 72 hours.  Invalid input(s): "APPERANCEUR"     Imaging: DG Abd 1 View  Result Date: 05/06/2022 CLINICAL DATA:  Small bowel obstruction.[id number]. EXAM: ABDOMEN - 1 VIEW COMPARISON:  05/03/2022 FINDINGS: There is persistent dilatation of small bowel loops in the central abdomen, similar in appearance to prior study. Relative normal appearance of the large bowel loops. Surgical clips are noted in the RIGHT UPPER QUADRANT of the abdomen. No evidence for free intraperitoneal air. IMPRESSION: Persistent dilatation of small bowel loops in the central abdomen compatible with small-bowel obstruction. Electronically Signed   By: Nolon Nations M.D.   On: 05/06/2022 09:00     Medications:    promethazine (PHENERGAN) injection (IM or IVPB) Stopped (05/04/22 1502)   sodium phosphate 15 mmol in dextrose 5 % 250 mL infusion 15 mmol (05/07/22 1335)   TPN ADULT (ION) 80 mL/hr at 05/07/22 1323   TPN ADULT (ION)      budesonide  0.5 mg Nebulization BID   Chlorhexidine Gluconate Cloth  6 each Topical Daily   feeding supplement  237 mL Oral TID BM   insulin aspart  0-6 Units Subcutaneous Q4H   ipratropium-albuterol  3 mL Nebulization BID   pantoprazole (PROTONIX) IV  40 mg Intravenous Q12H   rOPINIRole  1 mg Oral TID   sodium chloride flush  10-40 mL Intracatheter Q12H    Assessment/ Plan:     Principal Problem:   SBO (small  bowel obstruction) (HCC) Active Problems:   Morbid obesity with BMI of 50.0-59.9, adult (HCC)   OSA    Essential hypertension   Obstructive lung disease (generalized) (HCC)   Chronic diastolic CHF (congestive heart failure) (HCC)   Chronic respiratory failure with hypoxia and hypercapnia (HCC)   CKD (chronic kidney disease) stage 4, GFR 15-29 ml/min (HCC)   AMS (altered mental status)  SAVITA RUNNER is a 71 year old female with past medical history including COPD with renal failure requiring 2 L oxygen as needed, chronic diastolic heart failure, sleep apnea, morbid obesity, hypertension, and chronic kidney disease  stage IV. Patient presents to the emergency department complaining of abdominal pain and has been admitted for Small bowel obstruction ([place]) [K56.609] SBO (small bowel obstruction) ([place]) [K56.609]   Patient is known to our practice and is followed by Dr Holley Raring outpatient. She was last seen in office on 04/17/22.   #1: Acute kidney injury on chronic kidney disease stage IV: Creatinine continues to improve.  Diuretics currently held.  One-time dose of furosemide given yesterday, urine output 1.1 L recorded.  Patient remains on TPN with advanced full liquid diet.  Tolerating diet fair, complains of nausea this a.m.  Will continue to monitor for now.   #2: Anemia of chronic kidney disease: Will monitor with a.m. labs.   #3: Congestive heart failure: Remains on room air.  Adequate diuresis achieved with one-time furosemide dose yesterday.  Will continue to monitor.  Continue leg wraps to assist in management of lower extremity edema.   #4: Chronic shortness of breath/chronic respiratory failure: Continue oxygen supplementation along with CPAP at night.   #5: Small bowel obstruction: TPN remains in place, oral diet advanced.  Remains on Zosyn.   LOS: [facility] 12/19/20232:32 PM

## 2022-05-07 NOTE — Progress Notes (Signed)
Progress Note   Patient: Samantha Mason UXL:[id number] DOB: [birth year] DOA: 04/26/2022     11 DOS: the patient was seen and examined on 05/07/2022   Brief hospital course: Taken from prior note.  Samantha Mason is a 71 y.o. female with medical history significant for diastolic CHF, and COPD with chronic respiratory failure on 2 L nasal cannula as well as obstructive sleep apnea on CPAP, stage IIIb chronic kidney disease, hypertension and morbid obesity who presents to the ED with a 3-day history of crampy upper abdominal pain and a 1 day history of nonbloody nonbilious vomiting.  She denies fever or chills, diarrhea or dysuria.  Denies chest pain or shortness of breath. ED course and data review: BP 160/83 with pulse of 103 and otherwise normal vitals.  Labs with WBC 15,200.  Sodium 130, creatinine2.29, up from baseline of 1.96.  Lipase and LFTs within normal limits.  CT abdomen and pelvis showing SBO as described below: IMPRESSION: 1. Small-bowel obstruction with transition zone in the ileum. Specific etiology is not identified. 2. Aortic atherosclerosis.  Patient was admitted for SBO, general surgery was consulted and they advised conservative management.  Patient continued to refuse NG tube placement.  Worsening renal function. Nephrology was also consulted  12/12: Overnight patient became febrile with altered mental status.  Sepsis workup and protocol initiated by overnight provider.  No obvious source of infection so far.  CT head was negative for any acute intracranial abnormality.  Labs with worsening leukocytosis, creatinine and procalcitonin at 1.99.  She received fluid boluses and started on broad-spectrum antibiotics which include cefepime, Flagyl and vancomycin. Discontinuing vancomycin due to worsening renal function and no current diagnosis of MRSA Switching cefepime and Flagyl with Zosyn. Blood cultures still pending. CT abdomen and pelvis ordered-still pending. Worsening  renal function which continued to get worse despite getting some IV fluid. Might need starting of dialysis, concern of ATN.  Nephrology is on board.  12/13: Mild tachycardia and low 100s, leukocytosis resolved, hemoglobin stable, renal function with a slight improvement of creatinine to 4.96 and GFR of 9 from 8.  Patient started getting BM and passing flatus, nausea and vomiting improved today. CT abdomen with concern of worsening SBO with transition point in the anterior mid abdomen, likely due to adhesion.  No other significant abnormality-awaiting surgery team response while restarting clear liquid diet.  12/14: Hemodynamically stable, renal functions continue to improve with creatinine of 3.8 today and GFR improved to 12.  Diet was advanced to full liquid.  Hypomagnesemia resolved.  Surgery would like to monitor for another day.  12/15: Patient again developed nausea and vomiting so she was made n.p.o. by surgery.  Repeat abdominal x-ray with persistent small bowel dilation consistent with obstruction.  Having bowel movements and passing flatus.  Surgery advised n.p.o. and TPN.  Patient currently refusing PICC line placement and TPN and would like to continue trying p.o. she was also refusing surgery saying that she does not want to take any risk. Difficult patient as she is refusing most of the medical care advised. Discussed with her son and he will try discussing with her as it is just prolonging her length of stay without any meaningful intervention or help. Renal functions continue to improve, creatinine at 3.18 with GFR improved to 15 today.  12/16: Hemodynamically stable.  Renal functions continue to improve with creatinine at 2.85, and GFR of 17.  Electrolytes within normal limit.  Agreed to proceed with PICC line for TPN.  She is having bowel movement but continued to have nausea.  She would like to wait and still not ready for any surgery.  12/17: Hemodynamically stable, renal function  continued to improve and appears to be at baseline now with creatinine of 2.31.  Nephrology is recommending continuation of LR at this time.  She was started on TPN.  Continue to have bowel movements and passing flatus, nausea and vomiting improving.  Surgery will continue keeping her n.p.o. for bowel rest.  12/18: Patient clinically having bowel movements and feeling some improvement, case to be with persistently dilated bowel, she is still hesitant to proceed with surgery and would like to try a full liquid diet 1 more time before deciding.  Surgery advance her diet to full liquid. Will continue with TPN  12/19: Hemodynamically stable, mildly elevated blood pressure at 159/74.  Renal function continued to improve with creatinine at 1.69.  Mild hypophosphatemia which will be managed with TPN.  Currently tolerating full liquid diet.   Assessment and Plan: * SBO (small bowel obstruction) (HCC) CT showing small bowel obstruction with transition zone in the ileum Patient repeatedly refused NG tube placement ordered by ED after an initial unsuccessful attempt General surgery was consulted and they were recommending conservative management initially and then advising surgery as she did not improve after many days and repeat imaging with persistent bowel obstruction. Repeat CT abdomen with worsening of SBO and transition point, concern of adhesion.  Unable to tolerate full liquid diet so she was made n.p.o. again. Repeat imaging with persistent obstruction, patient would like to try a full liquid diet before thinking about surgery.  So diet was switched to full liquid. -Continue with TPN -Appreciate surgery help  AMS (altered mental status) Resolved Patient transiently became altered and febrile overnight.  Worsening leukocytosis, has been resolved Chest x-ray negative for pneumonia or pulmonary edema. CT head negative for any acute intracranial abnormality. Procalcitonin elevated at 1.11.  UA with  microscopic hematuria. Urine cultures with Enterococcus faecalis and blood cultures negative so far.  CT abdomen with worsening of SBO and no other acute abnormality.  She was given IV fluid and started on broad-spectrum antibiotics. -Continue Zosyn--completed total of 7 days course  CKD (chronic kidney disease) stage 4, GFR 15-29 ml/min (HCC) AKI with CKD stage IV. Renal function continued to improve with creatinine currently at 1.69 with baseline around 2.29. Nephrology was also consulted.  No contrast exposure.  Concern of ATN. -Nephrology would like to continue IV fluid -Monitor renal function -Avoid nephrotoxins  Chronic diastolic CHF (congestive heart failure) (HCC) Clinically euvolemic to dry Monitor for fluid overload in view of IV hydration with as needed IV Lasix Daily weights  Essential hypertension Blood pressure within goal Hydralazine IV as needed for elevated blood pressure  -Home antihypertensives are being currently held due to AKI and SBO  Chronic respiratory failure with hypoxia and hypercapnia (HCC) O2 requirement at baseline Continue supplemental oxygen  Obstructive lung disease (generalized) ([place]) Not acutely exacerbated Continue Pulmicort twice daily with DuoNebs as needed  Morbid obesity with BMI of 50.0-59.9, adult (HCC) Estimated body mass index is 61.31 kg/m as calculated from the following:   Height as of this encounter: '5\' 1"'$  (1.549 m).   Weight as of this encounter: 702.6 kg.   -Complicating factor to overall prognosis and care  OSA  CPAP nightly   Subjective: Patient was having mild nausea but no vomiting.  Continue to have loose bowel movements.  Physical Exam: Vitals:  05/07/22 0226 05/07/22 0406 05/07/22 0745 05/07/22 0750  BP:  (!) 133/55 (!) 159/74   Pulse:  88 86   Resp:  20 18   Temp:  98.1 F (36.7 C) 98 F (36.7 C)   TempSrc:  Oral Oral   SpO2: 94% 94% 95% 94%  Weight:      Height:       General.  Morbidly obese lady,  in no acute distress. Pulmonary.  Lungs clear bilaterally, normal respiratory effort. CV.  Regular rate and rhythm, no JVD, rub or murmur. Abdomen.  Soft, nontender, nondistended, BS positive. CNS.  Alert and oriented .  No focal neurologic deficit. Extremities.  No edema, no cyanosis, pulses intact and symmetrical. Psychiatry.  Judgment and insight appears normal. .      Data Reviewed: Prior data reviewed  Family Communication:   Disposition: Status is: Inpatient Remains inpatient appropriate because: Severity of illness-patient is refusing most of the medical care suggested which is resulted in prolonged length of stay.  Planned Discharge Destination: Home with home health  DVT prophylaxis.  Heparin subcu Time spent: 39 minutes  This record has been created using Systems analyst. Errors have been sought and corrected,but may not always be located. Such creation errors do not reflect on the standard of care.   Author: Lorella Nimrod, MD 05/07/2022 4:43 PM  For on call review [email or web address].

## 2022-05-08 ENCOUNTER — Inpatient Hospital Stay: Payer: Medicare Other | Admitting: Certified Registered"

## 2022-05-08 ENCOUNTER — Encounter: Payer: Self-pay | Admitting: Internal Medicine

## 2022-05-08 ENCOUNTER — Encounter
Admission: EM | Disposition: A | Discharge status: Home Health | Payer: Self-pay | Source: Home / Self Care | Attending: Internal Medicine

## 2022-05-08 DIAGNOSIS — Z6841 Body Mass Index (BMI) 40.0 and over, adult: Secondary | ICD-10-CM

## 2022-05-08 DIAGNOSIS — Z7189 Other specified counseling: Secondary | ICD-10-CM | POA: Diagnosis not present

## 2022-05-08 DIAGNOSIS — K56609 Unspecified intestinal obstruction, unspecified as to partial versus complete obstruction: Secondary | ICD-10-CM | POA: Diagnosis not present

## 2022-05-08 DIAGNOSIS — Z515 Encounter for palliative care: Secondary | ICD-10-CM | POA: Diagnosis not present

## 2022-05-08 LAB — BASIC METABOLIC PANEL
Anion gap: 6 (ref 5–15)
BUN: 53 mg/dL — ABNORMAL HIGH (ref 8–23)
CO2: 26 mmol/L (ref 22–32)
Calcium: 8.6 mg/dL — ABNORMAL LOW (ref 8.9–10.3)
Chloride: 108 mmol/L (ref 98–111)
Creatinine, Ser: 1.35 mg/dL — ABNORMAL HIGH (ref 0.44–1.00)
GFR, Estimated: 42 mL/min — ABNORMAL LOW (ref >60)
Glucose, Bld: 126 mg/dL — ABNORMAL HIGH (ref 70–99)
Potassium: 4.7 mmol/L (ref 3.5–5.1)
Sodium: 140 mmol/L (ref 135–145)

## 2022-05-08 LAB — MAGNESIUM: Magnesium: 2.3 mg/dL (ref 1.7–2.4)

## 2022-05-08 LAB — GLUCOSE, CAPILLARY
Glucose-Capillary: 101 mg/dL — ABNORMAL HIGH (ref 70–99)
Glucose-Capillary: 118 mg/dL — ABNORMAL HIGH (ref 70–99)
Glucose-Capillary: 132 mg/dL — ABNORMAL HIGH (ref 70–99)
Glucose-Capillary: 114 mg/dL — ABNORMAL HIGH (ref 70–99)

## 2022-05-08 LAB — PHOSPHORUS: Phosphorus: 4.1 mg/dL (ref 2.5–4.6)

## 2022-05-08 SURGERY — LAPAROTOMY, EXPLORATORY
Anesthesia: Choice

## 2022-05-08 MED ORDER — TORSEMIDE 20 MG PO TABS
40.0000 mg | ORAL_TABLET | Freq: Two times a day (BID) | ORAL | Status: DC
  Administered 2022-05-08 – 2022-05-09 (×2): 40 mg via ORAL
  Filled 2022-05-08 (×2): qty 2

## 2022-05-08 MED ORDER — TRACE MINERALS CU-MN-SE-ZN 300-55-60-3000 MCG/ML IV SOLN
INTRAVENOUS | Status: DC
  Filled 2022-05-08: qty 448

## 2022-05-08 MED ORDER — TRACE MINERALS CU-MN-SE-ZN 300-55-60-3000 MCG/ML IV SOLN
INTRAVENOUS | Status: DC
  Filled 2022-05-08: qty 896

## 2022-05-08 MED ORDER — METOLAZONE 2.5 MG PO TABS
2.5000 mg | ORAL_TABLET | ORAL | Status: DC
  Administered 2022-05-08: 2.5 mg via ORAL
  Filled 2022-05-08 (×2): qty 1

## 2022-05-08 SURGICAL SUPPLY — 40 items
APL PRP STRL LF DISP 70% ISPRP (MISCELLANEOUS) ×1
CHLORAPREP W/TINT 26 (MISCELLANEOUS) ×2 IMPLANT
DRAPE LAPAROTOMY 100X77 ABD (DRAPES) ×2 IMPLANT
DRSG OPSITE POSTOP 4X10 (GAUZE/BANDAGES/DRESSINGS) ×2 IMPLANT
DRSG OPSITE POSTOP 4X8 (GAUZE/BANDAGES/DRESSINGS) ×2 IMPLANT
ELECT BLADE 6.5 EXT (BLADE) IMPLANT
ELECT CAUTERY BLADE 6.4 (BLADE) ×2 IMPLANT
ELECT REM PT RETURN 9FT ADLT (ELECTROSURGICAL) ×1
ELECTRODE REM PT RTRN 9FT ADLT (ELECTROSURGICAL) ×2 IMPLANT
GAUZE 4X4 16PLY ~~LOC~~+RFID DBL (SPONGE) ×2 IMPLANT
GLOVE BIOGEL PI IND STRL 7.0 (GLOVE) ×2 IMPLANT
GLOVE SURG SYN 6.5 ES PF (GLOVE) ×2 IMPLANT
GLOVE SURG SYN 6.5 PF PI (GLOVE) ×2 IMPLANT
GOWN STRL REUS W/ TWL LRG LVL3 (GOWN DISPOSABLE) ×4 IMPLANT
GOWN STRL REUS W/TWL LRG LVL3 (GOWN DISPOSABLE) ×2
KIT TURNOVER KIT A (KITS) ×2 IMPLANT
LABEL OR SOLS (LABEL) ×2 IMPLANT
MANIFOLD NEPTUNE II (INSTRUMENTS) ×2 IMPLANT
NEEDLE HYPO 22GX1.5 SAFETY (NEEDLE) IMPLANT
NS IRRIG 1000ML POUR BTL (IV SOLUTION) ×2 IMPLANT
PACK BASIN MAJOR ARMC (MISCELLANEOUS) ×2 IMPLANT
PACK COLON CLEAN CLOSURE (MISCELLANEOUS) ×2 IMPLANT
RELOAD PROXIMATE 75MM BLUE (ENDOMECHANICALS) IMPLANT
RELOAD STAPLE 75 3.8 BLU REG (ENDOMECHANICALS) IMPLANT
SPONGE T-LAP 18X18 ~~LOC~~+RFID (SPONGE) ×8 IMPLANT
STAPLER PROXIMATE 75MM BLUE (STAPLE) IMPLANT
STAPLER SKIN PROX 35W (STAPLE) ×2 IMPLANT
SUT PDS AB 1 TP1 54 (SUTURE) ×4 IMPLANT
SUT SILK 2 0 (SUTURE) ×1
SUT SILK 2-0 18XBRD TIE 12 (SUTURE) ×2 IMPLANT
SUT SILK 3 0 (SUTURE) ×1
SUT SILK 3-0 (SUTURE) IMPLANT
SUT SILK 3-0 18XBRD TIE 12 (SUTURE) ×2 IMPLANT
SUT VIC AB 3-0 SH 27 (SUTURE) ×2
SUT VIC AB 3-0 SH 27X BRD (SUTURE) ×4 IMPLANT
SYR 20ML LL LF (SYRINGE) IMPLANT
TRAP FLUID SMOKE EVACUATOR (MISCELLANEOUS) ×2 IMPLANT
TRAY FOLEY MTR SLVR 16FR STAT (SET/KITS/TRAYS/PACK) ×2 IMPLANT
TRAY FOLEY SLVR 16FR LF STAT (SET/KITS/TRAYS/PACK) IMPLANT
WATER STERILE IRR 500ML POUR (IV SOLUTION) ×2 IMPLANT

## 2022-05-08 NOTE — Progress Notes (Signed)
PHARMACY - TOTAL PARENTERAL NUTRITION CONSULT NOTE   Indication:  small bowel obstruction  Patient Measurements: Height: '5\' 1"'$  (154.9 cm) Weight: (!) 150.1 kg (330 lb 14.6 oz) IBW/kg (Calculated) : 47.8 TPN AdjBW (KG): 73 Body mass index is 62.53 kg/m. Usual Weight: 151 kg  Assessment: 71 year old female with PMH dCHF, COPD on 2L PTA, OSA on CPAP, CKD3b, HTN, and morbid obesity admitted with small bowel obstruction unable to tolerate diet. Surgery planning for possible exploratory laparatomy.  TPN while awaiting surgery and need for decompressed bowel   Glucose / Insulin: CBGs 101 - 137 0 Units SSI/24 hrs Electrolytes: WNL Renal: Scr trending down: 2.85 >> 1.35 Hepatic: LFTs wnl TG: wnl Intake / Output: net  -279 mL GI Imaging: 05/06/22 KUB Persistent dilatation of small bowel loops in the central abdomen compatible with SBO GI Surgeries / Procedures: none  Central access: PICC 12/16 TPN start date: started TPN 12/16  Nutritional Goals: Goal TPN rate is 80 mL/hr (provides 134 g of protein and 2480 kcals per day)  Kcal:  2300-2600kcal/day Protein: 115-130g/day Fluid:  1.4-1.6L/day  BMI=62.9  Nutritional components at goal rate: Clinisol: 70 g/L Dextrose: 18% Smof lipids: 40 g/L   RD Assessment: Estimated Needs Total Energy Estimated Needs: 2300-2600kcal/day Total Protein Estimated Needs: 115-130g/day Total Fluid Estimated Needs: 1.4-1.6L/day  Current Nutrition:  Full liquid  Plan:  ---reduce TPN to 40 mL/hr  ---Electrolytes in TPN: Na 50mq/L, K 437m/L, Ca 32m48mL, Mg 32mE64m, and Phos 132mm104m. Cl:Ac 1:1 ---Add standard MVI and trace elements to TPN ---stop Sensitive q4h SSI  ---Monitor TPN labs in am and on Mon/Thurs, and as needed.   RodneVallery SarmD, BCPS Clinical Pharmacist 05/08/2022

## 2022-05-08 NOTE — Consult Note (Signed)
Consultation Note Date: 05/08/2022   Patient Name: Samantha Mason  DOB: 04-02-1951  MRN: [id number]  Age / Sex: 71 y.o., female  PCP: Glean Hess, MD Referring Physician: Lorella Nimrod, MD  Reason for Consultation: Establishing goals of care  HPI/Patient Profile: 71 y.o. female  with past medical history of morbid obesity with a BMI of 56, obstructive sleep apnea on CPAP/trilogy, COPD with chronic respiratory failure/2 L as needed, chronic diastolic heart failure with last EF of 55 to 60%, HTN with CKD 4, arthritis, IBS, restless leg, mild stroke in 1995 admitted on 01/23/2022 with acute on chronic hypoxic respiratory failure due to multifactorial reasons including morbid obesity, COPD, sleep apnea, diastolic heart failure, atelectasis admitted on 04/26/2022 with SBO.   Clinical Assessment and Goals of Care: I have reviewed medical records including EPIC notes, labs and imaging, received report from RN, assessed the patient.  Ms. Martinovich is sitting up quietly in bed.  She appears acutely/chronically ill, obese.  She is alert but sleepy.  She is able to make her needs known.  Her son/healthcare surrogate, Lillian Ballester, is present at bedside, along with nursing staff.    We meet at the bedside to discuss diagnosis prognosis, GOC, EOL wishes, disposition and options.  I introduced Palliative Medicine as specialized medical care for people living with serious illness. It focuses on providing relief from the symptoms and stress of a serious illness. The goal is to improve quality of life for both the patient and the family.  We discussed a brief life review of the patient.    We then focused on their current illness.  Mrs. Bonaventura and her son share that she has elected to go forward with surgery which is scheduled for tomorrow.  We talk about some expectations postsurgery.  I encouraged her to move as much as  possible.  We talked about risks for wound dehiscence due to size, pneumonia.  We talk about possible need for rehab versus going home.  Mrs. Gayman shares that her preference is to return home.  I shared that it remains to be seen, her ability to participate in her own care.  The natural disease trajectory and expectations at EOL were discussed.  Advanced directives, concepts specific to code status, were considered and discussed.  Ms. Allemand remains full scope/full code.  Discussed the importance of continued conversation with family and the medical providers regarding overall plan of care and treatment options, ensuring decisions are within the context of the patient's values and GOCs. Questions and concerns were addressed.  The family was encouraged to call with questions or concerns.  PMT will continue to support holistically.   HCPOA   NEXT OF KIN - son, [name]) [place].  Daughter, Izora Gala, lives in [place].     SUMMARY OF RECOMMENDATIONS   Agreeable to surgery. Remains full scope/full code We talk about possible need for rehab versus going home   Code Status/Advance Care Planning: Full code  Symptom Management:  Per hospitalist, no additional needs at this  time.  Palliative Prophylaxis:  No special needs at this time   Additional Recommendations (Limitations, Scope, Preferences): Full Scope Treatment  Psycho-social/Spiritual:  Desire for further Chaplaincy support:no Additional Recommendations: Caregiving  Support/Resources  Prognosis:  Unable to determine, based on outcomes.  Guarded at this point.  Discharge Planning: To be determined, based on outcomes.  Need for short-term rehab would not be surprising.      Primary Diagnoses: Present on Admission:  SBO (small bowel obstruction) ([place])  Essential hypertension  Obstructive lung disease (generalized) (HCC)  Chronic respiratory failure with hypoxia and hypercapnia (HCC)  CKD (chronic kidney disease)  stage 4, GFR 15-29 ml/min (HCC)  Chronic diastolic CHF (congestive heart failure) ([place])   I have reviewed the medical record, interviewed the patient and family, and examined the patient. The following aspects are pertinent.  Past Medical History:  Diagnosis Date   Acute bronchitis    Anemia    Arthritis    Asthma    B12 deficiency 03/14/2017   Chest pain 07/02/2018   Chest pain    a. 06/2018 MV: EF 66%. No ischemia/infarct; b. 02/2021 PET CT: EF>60%, no ischemia/infarct.   Chronic heart failure with preserved ejection fraction (HFpEF) ([place])    a. 08/2020 Echo: EF 55-60%, GrI DD; b. 06/2021 Echo: EF 55-60%, mild LVH, nl RV fxn, no signif valvular dzs.   COPD (chronic obstructive pulmonary disease) ([place]) 07/24/2018   Family history of blood clots 07/16/2017   Irritable bowel syndrome    Malignant hypertension    Obesity    OSA (obstructive sleep apnea)    uses cpap   Pneumonia 2017   Restless leg syndrome    Stroke [facility]) 1995   mild   Social History   Socioeconomic History   Marital status: Widowed    Spouse name: Not on file   Number of children: 2   Years of education: Not on file   Highest education level: Not on file  Occupational History   Not on file  Tobacco Use   Smoking status: Never   Smokeless tobacco: Never  Vaping Use   Vaping Use: Never used  Substance and Sexual Activity   Alcohol use: No    Alcohol/week: 0.0 standard drinks of alcohol   Drug use: No   Sexual activity: Never  Other Topics Concern   Not on file  Social History Narrative   Pt lives with her son   Social Determinants of Health   Financial Resource Strain: Medium Risk (08/14/2021)   Overall Financial Resource Strain (CARDIA)    Difficulty of Paying Living Expenses: Somewhat hard  Food Insecurity: No Food Insecurity (04/27/2022)   Hunger Vital Sign    Worried About Running Out of Food in the Last Year: Never true    Ran Out of Food in the Last Year: Never true  Transportation Needs:  No Transportation Needs (04/27/2022)   PRAPARE - Hydrologist (Medical): No    Lack of Transportation (Non-Medical): No  Physical Activity: Inactive (08/08/2021)   Exercise Vital Sign    Days of Exercise per Week: 0 days    Minutes of Exercise per Session: 0 min  Stress: No Stress Concern Present (08/08/2021)   [employer]    Feeling of Stress : Only a little  Social Connections: Socially Isolated (08/08/2021)   Social Connection and Isolation Panel [NHANES]    Frequency of Communication with Friends and Family: Once a week  Frequency of Social Gatherings with Friends and Family: Never    Attends Religious Services: Never    Marine scientist or Organizations: No    Attends Archivist Meetings: Never    Marital Status: Widowed   Family History  Problem Relation Age of Onset   Dementia Mother    COPD Father        1995   Diabetes Father    Heart failure Brother    Coronary artery disease Brother 78       CABG   Heart failure Maternal Grandmother    Stroke Maternal Grandmother    Breast cancer Neg Hx    Scheduled Meds:  budesonide  0.5 mg Nebulization BID   Chlorhexidine Gluconate Cloth  6 each Topical Daily   feeding supplement  237 mL Oral TID BM   ipratropium-albuterol  3 mL Nebulization BID   metolazone  2.5 mg Oral Once per day on Mon Thu   pantoprazole (PROTONIX) IV  40 mg Intravenous Q12H   rOPINIRole  1 mg Oral TID   sodium chloride flush  10-40 mL Intracatheter Q12H   torsemide  40 mg Oral BID   Continuous Infusions:  promethazine (PHENERGAN) injection (IM or IVPB) Stopped (05/07/22 2158)   TPN ADULT (ION) 80 mL/hr at 05/07/22 1721   TPN ADULT (ION)     PRN Meds:.acetaminophen **OR** acetaminophen, hydrALAZINE, ipratropium-albuterol, lip balm, morphine injection, ondansetron **OR** ondansetron (ZOFRAN) IV, promethazine (PHENERGAN) injection (IM or IVPB),  sodium chloride flush Medications Prior to Admission:  Prior to Admission medications   Medication Sig Start Date End Date Taking? Authorizing Provider  benzonatate (TESSALON) 100 MG capsule TAKE 1 CAPSULE BY MOUTH THREE TIMES A DAY AS NEEDED FOR COUGH 03/08/22  Yes Tyler Pita, MD  budesonide (PULMICORT) 0.5 MG/2ML nebulizer solution TAKE 2 ML (0.5 MG TOTAL) BY NEBULIZATION TWICE A DAY 10/29/21  Yes Tyler Pita, MD  calcitRIOL (ROCALTROL) 0.25 MCG capsule Take 0.25 mcg by mouth daily. 02/22/22  Yes [provider]  diltiazem (CARDIZEM CD) 240 MG 24 hr capsule Take 1 capsule (240 mg total) by mouth daily. 09/05/21  Yes Theora Gianotti, NP  ipratropium-albuterol (DUONEB) 0.5-2.5 (3) MG/3ML SOLN TAKE 3 MLS BY NEBULIZATION EVERY 6 (SIX) HOURS AS NEEDED. J45.40 03/04/22  Yes Tyler Pita, MD  metolazone (ZAROXOLYN) 2.5 MG tablet Take 1 tablet (2.5 mg total) by mouth 2 (two) times a week. 02/04/22  Yes Wouk, Ailene Rud, MD  pantoprazole (PROTONIX) 40 MG tablet TAKE 1 TABLET BY MOUTH EVERY DAY 12/11/21  Yes Glean Hess, MD  Potassium Chloride ER 20 MEQ TBCR Take 20 mEq by mouth 2 (two) times a week. On M & F with metolazone 02/07/22  Yes Alisa Graff, FNP  PREVALITE 4 g packet DISSOLVE AND DRINK 1 PACKET BY MOUTH AT BEDTIME 03/05/22  Yes Glean Hess, MD  rOPINIRole (REQUIP) 1 MG tablet Take 1 tablet (1 mg total) by mouth 3 (three) times daily. 02/13/22  Yes Glean Hess, MD  torsemide (DEMADEX) 20 MG tablet Take 2 tablets (40 mg total) by mouth 2 (two) times daily. 03/19/22  Yes Gollan, Kathlene November, MD  acetaminophen (TYLENOL) 500 MG tablet Take 1,000 mg by mouth every 6 (six) hours as needed.    [provider]  dapagliflozin propanediol (FARXIGA) 10 MG TABS tablet Take 1 tablet (10 mg total) by mouth daily before breakfast. Patient not taking: Reported on 04/27/2022 03/07/22   Alisa Graff,  FNP  NON FORMULARY Bipap - nightly with Oxygen 2  Lpm    [provider]  OXYGEN Inhale into the lungs. 2 LPM [place]    [provider]  promethazine (PHENERGAN) 25 MG tablet TAKE 1 TABLET BY MOUTH EVERY 6 HOURS AS NEEDED FOR NAUSEA OR VOMITING. 04/01/22 04/01/23  Glean Hess, MD   Allergies  Allergen Reactions   Nuvigil [Armodafinil] Hives   Penicillins Diarrhea and Nausea And Vomiting    Did it involve swelling of the face/tongue/throat, SOB, or low BP? no Did it involve sudden or severe rash/hives, skin peeling, or any reaction on the inside of your mouth or nose? No Did you need to seek medical attention at a hospital or doctor's office? No When did it last happen?  in her 71s    If all above answers are "NO", may proceed with cephalosporin use.    Spironolactone Itching   Gabapentin Other (See Comments)    "wiped her out, couldn't stay awake"   Melatonin Other (See Comments)    "Brain fog"   Provigil [Modafinil] Hives   Entresto [Sacubitril-Valsartan] Itching   Lisinopril Cough   Review of Systems  Unable to perform ROS: Other    Physical Exam Vitals and nursing note reviewed.  Constitutional:      General: She is not in acute distress.    Appearance: She is obese. She is ill-appearing.  Cardiovascular:     Rate and Rhythm: Normal rate.  Pulmonary:     Effort: Pulmonary effort is normal. No respiratory distress.  Skin:    General: Skin is warm and dry.  Neurological:     Mental Status: She is alert and oriented to person, place, and time.  Psychiatric:        Mood and Affect: Mood normal.        Behavior: Behavior normal.     Vital Signs: BP (!) 159/75 (BP Location: Right Wrist)   Pulse (!) 103   Temp 98.1 F (36.7 C) (Oral)   Resp 20   Ht '5\' 1"'$  (1.549 m)   Wt (!) 150.1 kg   SpO2 97%   BMI 62.53 kg/m  Pain Scale: 0-10 POSS *See Group Information*: 1-Acceptable,Awake and alert Pain Score: 4    SpO2: SpO2: 97 % O2 Device:SpO2: 97 % O2 Flow Rate: .O2 Flow Rate (L/min): 2  L/min  IO: Intake/output summary:  Intake/Output Summary (Last 24 hours) at 05/08/2022 1503 Last data filed at 05/08/2022 0848 Gross per 24 hour  Intake 50 ml  Output 400 ml  Net -350 ml    LBM: Last BM Date : 05/08/22 Baseline Weight: Weight: (!) 148.8 kg Most recent weight: Weight: (!) 150.1 kg     Palliative Assessment/Data:   Flowsheet Rows    Flowsheet Row Most Recent Value  Intake Tab   Referral Department Hospitalist  Unit at Time of Referral Med/Surg Unit  Date Notified 05/08/22  Palliative Care Type Return patient Palliative Care  Reason for referral Clarify Goals of Care  Date of Admission 04/26/22  Date first seen by Palliative Care 05/08/22  # of days Palliative referral response time 0 Day(s)  # of days IP prior to Palliative referral 12  Clinical Assessment   Palliative Performance Scale Score 30%  Pain Max last 24 hours Not able to report  Pain Min Last 24 hours Not able to report  Dyspnea Max Last 24 Hours Not able to report  Dyspnea Min Last 24 hours Not able to  report  Psychosocial & Spiritual Assessment   Palliative Care Outcomes        Time In: 1430 Time Out: 1545 Time Total: 75 minutes  Greater than 50%  of this time was spent counseling and coordinating care related to the above assessment and plan.  Signed by: Drue Novel, NP   Please contact Palliative Medicine Team phone at [phone] for questions and concerns.  For individual provider: See Shea Evans

## 2022-05-08 NOTE — Progress Notes (Signed)
Progress Note   Patient: Samantha Mason YOV:[id number] DOB: 1951/02/22 DOA: 04/26/2022     12 DOS: the patient was seen and examined on 05/08/2022   Brief hospital course: Taken from prior note.  ARLENNE KIMBLEY is a 71 y.o. female with medical history significant for diastolic CHF, and COPD with chronic respiratory failure on 2 L nasal cannula as well as obstructive sleep apnea on CPAP, stage IIIb chronic kidney disease, hypertension and morbid obesity who presents to the ED with a 3-day history of crampy upper abdominal pain and a 1 day history of nonbloody nonbilious vomiting.  She denies fever or chills, diarrhea or dysuria.  Denies chest pain or shortness of breath. ED course and data review: BP 160/83 with pulse of 103 and otherwise normal vitals.  Labs with WBC 15,200.  Sodium 130, creatinine2.29, up from baseline of 1.96.  Lipase and LFTs within normal limits.  CT abdomen and pelvis showing SBO as described below: IMPRESSION: 1. Small-bowel obstruction with transition zone in the ileum. Specific etiology is not identified. 2. Aortic atherosclerosis.  Patient was admitted for SBO, general surgery was consulted and they advised conservative management.  Patient continued to refuse NG tube placement.  Worsening renal function. Nephrology was also consulted  12/12: Overnight patient became febrile with altered mental status.  Sepsis workup and protocol initiated by overnight provider.  No obvious source of infection so far.  CT head was negative for any acute intracranial abnormality.  Labs with worsening leukocytosis, creatinine and procalcitonin at 1.99.  She received fluid boluses and started on broad-spectrum antibiotics which include cefepime, Flagyl and vancomycin. Discontinuing vancomycin due to worsening renal function and no current diagnosis of MRSA Switching cefepime and Flagyl with Zosyn. Blood cultures still pending. CT abdomen and pelvis ordered-still pending. Worsening  renal function which continued to get worse despite getting some IV fluid. Might need starting of dialysis, concern of ATN.  Nephrology is on board.  12/13: Mild tachycardia and low 100s, leukocytosis resolved, hemoglobin stable, renal function with a slight improvement of creatinine to 4.96 and GFR of 9 from 8.  Patient started getting BM and passing flatus, nausea and vomiting improved today. CT abdomen with concern of worsening SBO with transition point in the anterior mid abdomen, likely due to adhesion.  No other significant abnormality-awaiting surgery team response while restarting clear liquid diet.  12/14: Hemodynamically stable, renal functions continue to improve with creatinine of 3.8 today and GFR improved to 12.  Diet was advanced to full liquid.  Hypomagnesemia resolved.  Surgery would like to monitor for another day.  12/15: Patient again developed nausea and vomiting so she was made n.p.o. by surgery.  Repeat abdominal x-ray with persistent small bowel dilation consistent with obstruction.  Having bowel movements and passing flatus.  Surgery advised n.p.o. and TPN.  Patient currently refusing PICC line placement and TPN and would like to continue trying p.o. she was also refusing surgery saying that she does not want to take any risk. Difficult patient as she is refusing most of the medical care advised. Discussed with her son and he will try discussing with her as it is just prolonging her length of stay without any meaningful intervention or help. Renal functions continue to improve, creatinine at 3.18 with GFR improved to 15 today.  12/16: Hemodynamically stable.  Renal functions continue to improve with creatinine at 2.85, and GFR of 17.  Electrolytes within normal limit.  Agreed to proceed with PICC line for TPN.  She is having bowel movement but continued to have nausea.  She would like to wait and still not ready for any surgery.  12/17: Hemodynamically stable, renal function  continued to improve and appears to be at baseline now with creatinine of 2.31.  Nephrology is recommending continuation of LR at this time.  She was started on TPN.  Continue to have bowel movements and passing flatus, nausea and vomiting improving.  Surgery will continue keeping her n.p.o. for bowel rest.  12/18: Patient clinically having bowel movements and feeling some improvement, case to be with persistently dilated bowel, she is still hesitant to proceed with surgery and would like to try a full liquid diet 1 more time before deciding.  Surgery advance her diet to full liquid. Will continue with TPN  12/19: Hemodynamically stable, mildly elevated blood pressure at 159/74.  Renal function continued to improve with creatinine at 1.69.  Mild hypophosphatemia which will be managed with TPN.  Currently tolerating full liquid diet.  12/20: Remained hemodynamically stable, having bowel movement with minimal abdominal pain and vomiting.  Surgery was scheduled for later today and she refused again. Surgery signed off .  Later also got report from anesthesia during preoperative evaluation for this surgery that she will be a very high risk for perioperative complications due to multiple comorbidities and morbid obesity and they were recommending going to a tertiary care center for any future surgical procedures.  Had a long discussion with patient that she need to go to a tertiary care center if her symptoms persist.  She will most likely need a surgical procedure to help with her symptoms as she has persistent due to persistent SBO which is most likely secondary to adhesions.  Patient seems understanding.  [facility] care was also consulted.  In the late afternoon received another message by nursing staff that patient now wants to proceed with surgery tomorrow.  Surgery has already signed off and will not be able to proceed with her surgical request due to no slot available in OR and her being very high risk for  anesthesia.  We will try stopping TPN and if she continues to tolerate at least clear liquids she can be discharged home tomorrow so she can follow-up with a tertiary care center later at her convenience.   Assessment and Plan: * SBO (small bowel obstruction) (HCC) CT showing small bowel obstruction with transition zone in the ileum Patient repeatedly refused NG tube placement ordered by ED after an initial unsuccessful attempt General surgery was consulted and they were recommending conservative management initially and then advising surgery as she did not improve after many days and repeat imaging with persistent bowel obstruction. Repeat CT abdomen with worsening of SBO and transition point, concern of adhesion.  Unable to tolerate full liquid diet so she was made n.p.o. again. Repeat imaging with persistent obstruction, patient would like to try a full liquid diet before thinking about surgery.  So diet was switched to full liquid. -Continue with TPN-we will start taper to stop by tomorrow -Surgery signed off today.  AMS (altered mental status) Resolved Patient transiently became altered and febrile overnight.  Worsening leukocytosis, has been resolved Chest x-ray negative for pneumonia or pulmonary edema. CT head negative for any acute intracranial abnormality. Procalcitonin elevated at 1.11.  UA with microscopic hematuria. Urine cultures with Enterococcus faecalis and blood cultures negative so far.  CT abdomen with worsening of SBO and no other acute abnormality.  She was given IV fluid and started  on broad-spectrum antibiotics. -Continue Zosyn--completed total of 7 days course  CKD (chronic kidney disease) stage 4, GFR 15-29 ml/min (HCC) AKI with CKD stage IV. Renal function continued to improve with creatinine currently at 1.69 with baseline around 2.29. Nephrology was also consulted.  No contrast exposure.  Concern of ATN. -Nephrology would like to continue IV fluid -Monitor  renal function -Avoid nephrotoxins  Chronic diastolic CHF (congestive heart failure) (HCC) Clinically euvolemic to dry Monitor for fluid overload in view of IV hydration with as needed IV Lasix Daily weights  Essential hypertension Blood pressure within goal Hydralazine IV as needed for elevated blood pressure  -Home antihypertensives are being currently held due to AKI and SBO  Chronic respiratory failure with hypoxia and hypercapnia (HCC) O2 requirement at baseline Continue supplemental oxygen  Obstructive lung disease (generalized) ([place]) Not acutely exacerbated Continue Pulmicort twice daily with DuoNebs as needed  Morbid obesity with BMI of 50.0-59.9, adult (HCC) Estimated body mass index is 61.31 kg/m as calculated from the following:   Height as of this encounter: '5\' 1"'$  (1.549 m).   Weight as of this encounter: 701.4 kg.   -Complicating factor to overall prognosis and care  OSA  CPAP nightly   Subjective: Patient was feeling little improved today.  No more nausea and had a bowel movement last night.  Tolerating clear liquid well.  Physical Exam: Vitals:   05/07/22 2049 05/08/22 0744 05/08/22 0746 05/08/22 1628  BP: (!) 146/60 (!) 159/75  108/64  Pulse: 97 (!) 103  95  Resp: '20 20  20  '$ Temp: 98.4 F (36.9 C) 98.1 F (36.7 C)  98 F (36.7 C)  TempSrc: Oral Oral  Oral  SpO2: 99% 97% 97% 96%  Weight:      Height:       General.  Morbidly obese lady, in no acute distress. Pulmonary.  Lungs clear bilaterally, normal respiratory effort. CV.  Regular rate and rhythm, no JVD, rub or murmur. Abdomen.  Soft, nontender, nondistended, BS positive. CNS.  Alert and oriented .  No focal neurologic deficit. Extremities.  No edema, no cyanosis, pulses intact and symmetrical. Psychiatry.  Judgment and insight appears normal.     Data Reviewed: Prior data reviewed  Family Communication: Discussed with patient, tried calling son with no response  Disposition: Status  is: Inpatient Remains inpatient appropriate because: Severity of illness-patient is refusing most of the medical care suggested which is resulted in prolonged length of stay.  Planned Discharge Destination: Home with home health  DVT prophylaxis.  Heparin subcu Time spent: 40 minutes  This record has been created using Systems analyst. Errors have been sought and corrected,but may not always be located. Such creation errors do not reflect on the standard of care.   Author: Lorella Nimrod, MD 05/08/2022 6:23 PM  For on call review [email or web address].

## 2022-05-08 NOTE — Progress Notes (Cosign Needed Addendum)
[facility]  PROGRESS NOTE   Subjective:   Patient seen sitting up in chair, states she feels well today Nausea has remained a concern, surgery has continued NPO TPN infusing 2L [place] Patient refused surgery, states she feels it's not necessary.    Objective:  Vital signs: Blood pressure (!) 159/75, pulse (!) 103, temperature 98.1 F (36.7 C), temperature source Oral, resp. rate 20, height '5\' 1"'$  (1.549 m), weight (!) 150.1 kg, SpO2 97 %.  Intake/Output Summary (Last 24 hours) at 05/08/2022 1402 Last data filed at 05/08/2022 0848 Gross per 24 hour  Intake 50 ml  Output 400 ml  Net -350 ml    Filed Weights   04/29/22 0650 05/03/22 0404 05/06/22 1606  Weight: (!) 147.2 kg (!) 151.1 kg (!) 150.1 kg     Physical Exam: General:  No acute distress  Head:  Normocephalic, atraumatic. Moist oral mucosal membranes  Eyes:  Anicteric  Lungs:   Clear to auscultation, normal effort  Heart:  S1S2 no rubs  Abdomen:   Soft, obese  Extremities: 1+ peripheral edema.  Bilateral Ace wraps  Neurologic:  Awake, alert, following commands  Skin:  No lesions  Access: None    Basic Metabolic Panel: Recent Labs  Lab 05/04/22 0510 05/05/22 0355 05/06/22 0325 05/07/22 0643 05/08/22 0708  NA 136 137 139 140 140  K 3.9 3.8 4.0 4.0 4.7  CL 101 103 104 106 108  CO2 '26 27 27 28 26  '$ GLUCOSE 83 122* 131* 127* 126*  BUN 69* 58* 53* 57* 53*  CREATININE 2.85* 2.31* 1.93* 1.69* 1.35*  CALCIUM 8.7* 8.7* 8.8* 9.2 8.6*  MG 1.8 2.2 1.8 1.8 2.3  PHOS 2.7 2.7 2.4* 2.3* 4.1     CBC: Recent Labs  Lab 05/02/22 0559  WBC 7.6  HGB 9.6*  HCT 30.6*  MCV 78.3*  PLT 315      Urinalysis: No results for input(s): "COLORURINE", "LABSPEC", "PHURINE", "GLUCOSEU", "HGBUR", "BILIRUBINUR", "KETONESUR", "PROTEINUR", "UROBILINOGEN", "NITRITE", "LEUKOCYTESUR" in the last 72 hours.  Invalid input(s): "APPERANCEUR"    Imaging: No results found.   Medications:    promethazine  (PHENERGAN) injection (IM or IVPB) Stopped (05/07/22 2158)   TPN ADULT (ION) 80 mL/hr at 05/07/22 1721   TPN ADULT (ION)      budesonide  0.5 mg Nebulization BID   Chlorhexidine Gluconate Cloth  6 each Topical Daily   feeding supplement  237 mL Oral TID BM   ipratropium-albuterol  3 mL Nebulization BID   metolazone  2.5 mg Oral Once per day on Mon Thu   pantoprazole (PROTONIX) IV  40 mg Intravenous Q12H   rOPINIRole  1 mg Oral TID   sodium chloride flush  10-40 mL Intracatheter Q12H   torsemide  40 mg Oral BID    Assessment/ Plan:     Principal Problem:   SBO (small bowel obstruction) (HCC) Active Problems:   Morbid obesity with BMI of 50.0-59.9, adult (HCC)   OSA    Essential hypertension   Obstructive lung disease (generalized) (HCC)   Chronic diastolic CHF (congestive heart failure) (HCC)   Chronic respiratory failure with hypoxia and hypercapnia (HCC)   CKD (chronic kidney disease) stage 4, GFR 15-29 ml/min (HCC)   AMS (altered mental status)  Samantha Mason is a 71 year old female with past medical history including COPD with renal failure requiring 2 L oxygen as needed, chronic diastolic heart failure, sleep apnea, morbid obesity, hypertension, and chronic kidney disease stage IV. Patient presents to  the emergency department complaining of abdominal pain and has been admitted for Small bowel obstruction ([place]) [K56.609] SBO (small bowel obstruction) ([place]) [K56.609]   Patient is known to our practice and is followed by Dr Holley Raring outpatient. She was last seen in office on 04/17/22.   #1: Acute kidney injury on chronic kidney disease stage IV: Creatinine has improved beyond baseline.Will continue to follow patient in the office at discharge.    #2: Anemia of chronic kidney disease: Stable   #3: Congestive heart failure: Diuretics remain held. Continue leg wraps to assist in management of lower extremity edema.   #4: Chronic shortness of breath/chronic respiratory failure:  Continue CPAP at night with 2L [place] during the day.   #5: Small bowel obstruction: TPN remains in place, returned to NPO for nausea. Completed antibiotic therapy.  Due to stable renal function we will sign off at this time.  Feel free to contact us with further questions or concerns.   LOS: [facility] 12/20/20232:02 PM

## 2022-05-08 NOTE — Progress Notes (Signed)
Pt mentioned that she was having surgery in the AM, I explained per MD notes that there is no slot for surgery in the AM and that the Pt if tolerating diet can be DC'ed. Pt would like MD to explained why she has to be DC'ed and not transferred to another hospital due to the fact she was told that her surgery may be high risk.

## 2022-05-08 NOTE — Progress Notes (Addendum)
Subjective:  CC: Samantha Mason is a 71 y.o. female  Hospital stay day 12, Day of Surgery small bowel obstruction  HPI: Pain and nausea episode again.  Still having bowel movements   ROS:  General: Denies weight loss, weight gain, fatigue, fevers, chills, and night sweats. Heart: Denies chest pain, palpitations, racing heart, irregular heartbeat, leg pain or swelling, and decreased activity tolerance. Respiratory: Denies breathing difficulty, shortness of breath, wheezing, cough, and sputum. GI: Denies change in appetite, heartburn, vomiting, constipation, diarrhea, and blood in stool. GU: Denies difficulty urinating, pain with urinating, urgency, frequency, blood in urine.   Objective:   Temp:  [97.9 F (36.6 C)-98.4 F (36.9 C)] 98.1 F (36.7 C) (12/20 0744) Pulse Rate:  [97-103] 103 (12/20 0744) Resp:  [18-20] 20 (12/20 0744) BP: (141-159)/(60-75) 159/75 (12/20 0744) SpO2:  [95 %-99 %] 97 % (12/20 0746)     Height: '5\' 1"'$  (154.9 cm) Weight: (!) 150.1 kg BMI (Calculated): 62.56   Intake/Output this shift:   Intake/Output Summary (Last 24 hours) at 05/08/2022 3267 Last data filed at 05/08/2022 0848 Gross per 24 hour  Intake 520.54 ml  Output 800 ml  Net -279.46 ml     Constitutional :  alert, cooperative, appears stated age, and no distress  Respiratory:  clear to auscultation bilaterally  Cardiovascular:  regular rate and rhythm  Gastrointestinal: Soft, no guarding, tenderness to palpation again in the epigastrium today. .   Skin: Cool and moist.   Psychiatric: Normal affect, non-agitated, not confused       LABS:     Latest Ref Rng & Units 05/08/2022    7:08 AM 05/07/2022    6:43 AM 05/06/2022    3:25 AM  CMP  Glucose 70 - 99 mg/dL 126  127  131   BUN 8 - 23 mg/dL 53  57  53   Creatinine 0.44 - 1.00 mg/dL 1.35  1.69  1.93   Sodium 135 - 145 mmol/L 140  140  139   Potassium 3.5 - 5.1 mmol/L 4.7  4.0  4.0   Chloride 98 - 111 mmol/L 108  106  104   CO2 22 -  32 mmol/L '26  28  27   '$ Calcium 8.9 - 10.3 mg/dL 8.6  9.2  8.8   Total Protein 6.5 - 8.1 g/dL   5.8   Total Bilirubin 0.3 - 1.2 mg/dL   0.3   Alkaline Phos 38 - 126 U/L   59   AST 15 - 41 U/L   18   ALT 0 - 44 U/L   15       Latest Ref Rng & Units 05/02/2022    5:59 AM 05/01/2022    2:06 AM 04/30/2022    4:18 AM  CBC  WBC 4.0 - 10.5 K/uL 7.6  8.6  21.0   Hemoglobin 12.0 - 15.0 g/dL 9.6  10.3  10.0   Hematocrit 36.0 - 46.0 % 30.6  34.3  32.2   Platelets 150 - 400 K/uL 315  344  357     RADS: N/a  Assessment:   Patient continues to report bowel movements on full liquid diet but has recurrence of same epigastric pain and feeling of nausea.  Offered proceeding with surgery today and she initially agreed but then but patient refused again couple hours later.  Our plan will be NPO until pain and nausea fully resolves prior to resuming a clear liquid diet. She will need to stay on a clear  liquid diet until surgery. we will not schedule her until she is 99% committed to a specific day and time. I asked about her leaving the hospital, but she stated she'll just come right back if she gets sick again so unwilling to leave at this time, despite multiple instances of noncompliance with recommendations.  Will continue TPN in the meantime.  I made it very clear the timing and resource constraints that will not allow her to make multiple requests and then cancel immediately afterwards.  She verbalized understanding but still does not want to proceed with surgery at this time, stating she needs to have her daughter here to make decisions.  She made it very clear that she is able to make her own decisions but prefers not to not make any additional decisions until her daughter is able to make arrangements to come see her in person.  I also requested that her daughter plans to be here minimum of a week in order to arrange time for surgery and the possible postop care needed.  Anesthesia preevaluation  completed when the surgery was initially scheduled for later today.  Received report from the anesthesia team that they recommend patient be transferred to a tertiary care center due to the prohibitive risk of possible perioperative complications from an anesthesia standpoint due to her multiple comorbidities.  They requested that the patient receive any surgical procedures elsewhere in the future.  Since the patient is clinically stable at this time and is tolerating a clear liquid diet, will recommend transfer to a tertiary care center to the primary team if possible.  Updates above relayed to primary hospitalist team and they acknowledged.  Will defer final disposition to the admitting team.  Surgery will sign off for now.  Please call with any additional questions or concerns    labs/images/medications/previous chart entries reviewed personally and relevant changes/updates noted above.

## 2022-05-09 DIAGNOSIS — Z515 Encounter for palliative care: Secondary | ICD-10-CM | POA: Diagnosis not present

## 2022-05-09 DIAGNOSIS — Z7189 Other specified counseling: Secondary | ICD-10-CM | POA: Diagnosis not present

## 2022-05-09 DIAGNOSIS — K56609 Unspecified intestinal obstruction, unspecified as to partial versus complete obstruction: Secondary | ICD-10-CM | POA: Diagnosis not present

## 2022-05-09 LAB — PHOSPHORUS: Phosphorus: 3.8 mg/dL (ref 2.5–4.6)

## 2022-05-09 LAB — COMPREHENSIVE METABOLIC PANEL
ALT: 11 U/L (ref 0–44)
AST: 12 U/L — ABNORMAL LOW (ref 15–41)
Albumin: 2.8 g/dL — ABNORMAL LOW (ref 3.5–5.0)
Alkaline Phosphatase: 58 U/L (ref 38–126)
Anion gap: 10 (ref 5–15)
BUN: 52 mg/dL — ABNORMAL HIGH (ref 8–23)
CO2: 25 mmol/L (ref 22–32)
Calcium: 8.7 mg/dL — ABNORMAL LOW (ref 8.9–10.3)
Chloride: 104 mmol/L (ref 98–111)
Creatinine, Ser: 1.56 mg/dL — ABNORMAL HIGH (ref 0.44–1.00)
GFR, Estimated: 36 mL/min — ABNORMAL LOW (ref >60)
Glucose, Bld: 106 mg/dL — ABNORMAL HIGH (ref 70–99)
Potassium: 4.4 mmol/L (ref 3.5–5.1)
Sodium: 139 mmol/L (ref 135–145)
Total Bilirubin: 0.8 mg/dL (ref 0.3–1.2)
Total Protein: 6.4 g/dL — ABNORMAL LOW (ref 6.5–8.1)

## 2022-05-09 LAB — MAGNESIUM: Magnesium: 1.9 mg/dL (ref 1.7–2.4)

## 2022-05-09 LAB — TRIGLYCERIDES: Triglycerides: 84 mg/dL (ref <150)

## 2022-05-09 MED ORDER — LACTATED RINGERS IV SOLN: INTRAVENOUS | Status: DC | PRN

## 2022-05-09 MED ORDER — LACTATED RINGERS IV SOLN: INTRAVENOUS | Status: DC

## 2022-05-09 NOTE — Progress Notes (Signed)
Subjective:  CC: Samantha Mason is a 71 y.o. female  Hospital stay day 13,  small bowel obstruction  HPI: Pain and nausea improving.  Still having bowel movements on clear liquid diet.  Diet was advanced per hospitalist team.  ROS:  General: Denies weight loss, weight gain, fatigue, fevers, chills, and night sweats. Heart: Denies chest pain, palpitations, racing heart, irregular heartbeat, leg pain or swelling, and decreased activity tolerance. Respiratory: Denies breathing difficulty, shortness of breath, wheezing, cough, and sputum. GI: Denies change in appetite, heartburn, vomiting, constipation, diarrhea, and blood in stool. GU: Denies difficulty urinating, pain with urinating, urgency, frequency, blood in urine.   Objective:   Temp:  [98 F (36.7 C)-99.1 F (37.3 C)] 99.1 F (37.3 C) (12/21 0801) Pulse Rate:  [89-98] 90 (12/21 0801) Resp:  [16-20] 16 (12/21 0801) BP: (108-145)/(44-64) 133/44 (12/21 0801) SpO2:  [96 %-99 %] 99 % (12/21 0801) FiO2 (%):  [21 %] 21 % (12/20 2029) Weight:  [152 kg] 152 kg (12/21 0500)     Height: '5\' 1"'$  (154.9 cm) Weight: (!) 152 kg BMI (Calculated): 63.35   Intake/Output this shift:   Intake/Output Summary (Last 24 hours) at 05/09/2022 1357 Last data filed at 05/09/2022 1000 Gross per 24 hour  Intake 1412.61 ml  Output 2050 ml  Net -637.39 ml     Constitutional :  alert, cooperative, appears stated age, and no distress  Respiratory:  clear to auscultation bilaterally  Cardiovascular:  regular rate and rhythm  Gastrointestinal: Soft, no guarding, tenderness to palpation again in the epigastrium today. .   Skin: Cool and moist.   Psychiatric: Normal affect, non-agitated, not confused       LABS:     Latest Ref Rng & Units 05/09/2022    6:21 AM 05/08/2022    7:08 AM 05/07/2022    6:43 AM  CMP  Glucose 70 - 99 mg/dL 106  126  127   BUN 8 - 23 mg/dL 52  53  57   Creatinine 0.44 - 1.00 mg/dL 1.56  1.35  1.69   Sodium 135 - 145  mmol/L 139  140  140   Potassium 3.5 - 5.1 mmol/L 4.4  4.7  4.0   Chloride 98 - 111 mmol/L 104  108  106   CO2 22 - 32 mmol/L '25  26  28   '$ Calcium 8.9 - 10.3 mg/dL 8.7  8.6  9.2   Total Protein 6.5 - 8.1 g/dL 6.4     Total Bilirubin 0.3 - 1.2 mg/dL 0.8     Alkaline Phos 38 - 126 U/L 58     AST 15 - 41 U/L 12     ALT 0 - 44 U/L 11         Latest Ref Rng & Units 05/02/2022    5:59 AM 05/01/2022    2:06 AM 04/30/2022    4:18 AM  CBC  WBC 4.0 - 10.5 K/uL 7.6  8.6  21.0   Hemoglobin 12.0 - 15.0 g/dL 9.6  10.3  10.0   Hematocrit 36.0 - 46.0 % 30.6  34.3  32.2   Platelets 150 - 400 K/uL 315  344  357     RADS: N/a  Assessment:   Surgery requested to facilitate transfer request to tertiary care center as noted in previous progress note.  Patient still is a prohibitive risk to proceed with surgery per anesthesia assessment.    Patient today is stating that she no longer needs to  have daughter at bedside to make further decisions regarding care.  She is now agreeable to proceed with surgery despite her daughter not being able to be at bedside anymore.This is a new development since yesterday. She specifically verbalized she refuses to be transferred anywhere beyond [facility] or [place] due to her son's transportation issues.    [facility] and [facility] transfer center noted no bed availability. [facility] emergency surgery team declined transfer stating patient is not considered to be a "emergent" case.  Updates provided to admitting provider regarding inability to have patient transferred.  My recommendation stil is to be NPO until pain and nausea COMPLETELY resolves prior to resuming a clear liquid diet. She will need to stay on a clear liquid diet until surgery.   Further disposition per hospitalist team.  Surgery will again sign off.  Please call with any additional questions or concerns  labs/images/medications/previous chart entries reviewed personally and relevant changes/updates noted  above.

## 2022-05-09 NOTE — Progress Notes (Addendum)
Nutrition Follow-up  DOCUMENTATION CODES:   Morbid obesity  INTERVENTION:   -Advance diet to full liquid per MD -Continue Ensure Enlive po TID, each supplement provides 350 kcal and 20 grams of protein.  -Magic cup TID with meals, each supplement provides 290 kcal and 9 grams of protein  -Mighty Shake TID with meals, each supplement provides 220 kcals and 6 grams protein -MVI with minerals daily  NUTRITION DIAGNOSIS:   Inadequate oral intake related to acute illness as evidenced by NPO status.  Progressing; advanced to full liquids on 05/09/22  GOAL:   Patient will meet greater than or equal to 90% of their needs  Progressing   MONITOR:   PO intake, Supplement acceptance, Diet advancement, Labs, Weight trends, Skin, I & O's  REASON FOR ASSESSMENT:   Malnutrition Screening Tool    ASSESSMENT:   71 y/o female with h/o morbid obesity, OSA, HTN, COPD, CHF, CKD IV, GERD, IBS and RLS who is admitted with SBO.  Reviewed I/O's: -2.1 L x 24 hours and +2.7 L since admission  UOP: 2.6 L x 24 hours   Case discussed with MD. TPN d/c'd today. Surgery has signed off and will be unable to perform surgery today secondary to scheduling and being at high risk for anesthesia. Anesthesia recommending transferring to a tertiary care center due to high surgical risk. Pt remains on clear liquid diet. RD voiced concern over stopping TPN and continuing on clear liquids, as pt will be unable to maintain nutritional needs on diet due to restriction. Plan to advance to full liquids and add supplements to see if she tolerates. Pt is refusing Ensure supplements; RD has addended supplements in attempt to optimize oral intake.   Per palliative care, pt desires full scope care and is agreeable to surgery.   Medications reviewed and include demadex.   Labs reviewed: CBGS: 114-132 (inpatient orders for glycemic control are none).    Diet Order:   Diet Order             Diet full liquid Room  service appropriate? Yes; Fluid consistency: Thin  Diet effective now                   EDUCATION NEEDS:   Education needs have been addressed  Skin:  Skin Assessment: Reviewed RN Assessment  Last BM:  05/09/22 (type 6)  Height:   Ht Readings from Last 1 Encounters:  04/27/22 '5\' 1"'$  (1.549 m)    Weight:   Wt Readings from Last 1 Encounters:  05/09/22 (!) 152 kg    Ideal Body Weight:  47.7 kg  BMI:  Body mass index is 63.32 kg/m.  Estimated Nutritional Needs:   Kcal:  2300-2600kcal/day  Protein:  115-130g/day  Fluid:  1.4-1.6L/day    Loistine Chance, RD, LDN, [place] Registered Dietitian II Certified Diabetes Care and Education Specialist Please refer to [facility] for RD and/or RD on-call/weekend/after hours pager

## 2022-05-09 NOTE — Progress Notes (Signed)
Patient is a 71 yo female with PMH significant for CHF, COPD on 2L [place], OSA, stage 3 CKD, HTN, and super morbid obesity with a BMI of 63.  Patient presented with an SBO and Dr. Lysle Maram was planning to do an ex-lap on 05/08/2022 prior to the patient refusing surgery.  That was the first time we were made known of this patient's status and after discussion with Dr. Lysle Smantha as well as several of my colleagues in the anesthesia department, and given the patient's comorbidities and her extreme BMI of 63, we feel that she would be better served at a higher level of care at this time.  If you have any questions or concerns please feel free to reach out to the anesthesia department at [phone].  Thank you very much.

## 2022-05-09 NOTE — Progress Notes (Addendum)
Palliative: Samantha Mason is sitting up in the Odin chair in her room.  She greets me, making and somewhat keeping eye contact.  She appears acutely/chronically ill and frail, morbidly obese.  She is alert and oriented x 3, able to make her needs known.  There is no family at bedside at this time.  We discussed surgery for bowel obstruction.  At this point Samantha Mason states that she is unsure if/when this will occur.  She states that she is waiting to speak with the doctors.  We talk about outpatient palliative services for continued support.  Samantha Mason states that she had hospice services for her mother and home about 5 years ago.  She states that she does not feel like she needs palliative care because, "I am a fighter, I will fight to the end".  I reassure her that palliative medicine can be for people seeking full aggressive treatment.  A nurse practitioner coming once a month to offered tips advice help guide and direct care.  I encourage Samantha Mason to think about this is an extra layer of support for her.  Conference with attending, bedside nursing staff, pharmacist, transition of care team related to patient condition, needs, goals of care, disposition.  Plan:  Continue full scope/full code.  Time for outcomes.  Declines outpatient palliative services.  76 minutes   Quinn Axe, NP Palliative medicine team Team phone [phone] Greater than 50% of this time was spent counseling and coordinating care related to the above assessment and plan.

## 2022-05-09 NOTE — TOC Progression Note (Signed)
Transition of Care [facility]) - Progression Note    Patient Details  Name: Samantha Mason MRN: [id number] Date of Birth: 1951/02/26  Transition of Care [facility]) CM/SW Contact  Beverly Sessions, RN Phone Number: 05/09/2022, 1:42 PM  Clinical Narrative:     Full liquid diet today Will not have surgery while at [facility].  Patient to be serviced by [facility] for [facility] health services at discharge        Expected Discharge Plan and Services                                               Social Determinants of Health (SDOH) Interventions SDOH Screenings   Food Insecurity: No Food Insecurity (04/27/2022)  Housing: Low Risk  (04/27/2022)  Transportation Needs: No Transportation Needs (04/27/2022)  Utilities: Not At Risk (04/27/2022)  Alcohol Screen: Low Risk  (08/08/2021)  Depression (PHQ2-9): Medium Risk (02/25/2022)  Financial Resource Strain: Medium Risk (08/14/2021)  Physical Activity: Inactive (08/08/2021)  Social Connections: Socially Isolated (08/08/2021)  Stress: No Stress Concern Present (08/08/2021)  Tobacco Use: Low Risk  (05/08/2022)    Readmission Risk Interventions     No data to display

## 2022-05-09 NOTE — Progress Notes (Signed)
Progress Note   Patient: Samantha Mason LPF:[id number] DOB: 07-Jul-1950 DOA: 04/26/2022     13 DOS: the patient was seen and examined on 05/09/2022   Brief hospital course: Taken from prior note.  KLANI CARIDI is a 71 y.o. female with medical history significant for diastolic CHF, and COPD with chronic respiratory failure on 2 L nasal cannula as well as obstructive sleep apnea on CPAP, stage IIIb chronic kidney disease, hypertension and morbid obesity who presents to the ED with a 3-day history of crampy upper abdominal pain and a 1 day history of nonbloody nonbilious vomiting.  She denies fever or chills, diarrhea or dysuria.  Denies chest pain or shortness of breath. ED course and data review: BP 160/83 with pulse of 103 and otherwise normal vitals.  Labs with WBC 15,200.  Sodium 130, creatinine2.29, up from baseline of 1.96.  Lipase and LFTs within normal limits.  CT abdomen and pelvis showing SBO as described below: IMPRESSION: 1. Small-bowel obstruction with transition zone in the ileum. Specific etiology is not identified. 2. Aortic atherosclerosis.  Patient was admitted for SBO, general surgery was consulted and they advised conservative management.  Patient continued to refuse NG tube placement.  Worsening renal function. Nephrology was also consulted  12/12: Overnight patient became febrile with altered mental status.  Sepsis workup and protocol initiated by overnight provider.  No obvious source of infection so far.  CT head was negative for any acute intracranial abnormality.  Labs with worsening leukocytosis, creatinine and procalcitonin at 1.99.  She received fluid boluses and started on broad-spectrum antibiotics which include cefepime, Flagyl and vancomycin. Discontinuing vancomycin due to worsening renal function and no current diagnosis of MRSA Switching cefepime and Flagyl with Zosyn. Blood cultures still pending. CT abdomen and pelvis ordered-still pending. Worsening  renal function which continued to get worse despite getting some IV fluid. Might need starting of dialysis, concern of ATN.  Nephrology is on board.  12/13: Mild tachycardia and low 100s, leukocytosis resolved, hemoglobin stable, renal function with a slight improvement of creatinine to 4.96 and GFR of 9 from 8.  Patient started getting BM and passing flatus, nausea and vomiting improved today. CT abdomen with concern of worsening SBO with transition point in the anterior mid abdomen, likely due to adhesion.  No other significant abnormality-awaiting surgery team response while restarting clear liquid diet.  12/14: Hemodynamically stable, renal functions continue to improve with creatinine of 3.8 today and GFR improved to 12.  Diet was advanced to full liquid.  Hypomagnesemia resolved.  Surgery would like to monitor for another day.  12/15: Patient again developed nausea and vomiting so she was made n.p.o. by surgery.  Repeat abdominal x-ray with persistent small bowel dilation consistent with obstruction.  Having bowel movements and passing flatus.  Surgery advised n.p.o. and TPN.  Patient currently refusing PICC line placement and TPN and would like to continue trying p.o. she was also refusing surgery saying that she does not want to take any risk. Difficult patient as she is refusing most of the medical care advised. Discussed with her son and he will try discussing with her as it is just prolonging her length of stay without any meaningful intervention or help. Renal functions continue to improve, creatinine at 3.18 with GFR improved to 15 today.  12/16: Hemodynamically stable.  Renal functions continue to improve with creatinine at 2.85, and GFR of 17.  Electrolytes within normal limit.  Agreed to proceed with PICC line for TPN.  She is having bowel movement but continued to have nausea.  She would like to wait and still not ready for any surgery.  12/17: Hemodynamically stable, renal function  continued to improve and appears to be at baseline now with creatinine of 2.31.  Nephrology is recommending continuation of LR at this time.  She was started on TPN.  Continue to have bowel movements and passing flatus, nausea and vomiting improving.  Surgery will continue keeping her n.p.o. for bowel rest.  12/18: Patient clinically having bowel movements and feeling some improvement, case to be with persistently dilated bowel, she is still hesitant to proceed with surgery and would like to try a full liquid diet 1 more time before deciding.  Surgery advance her diet to full liquid. Will continue with TPN  12/19: Hemodynamically stable, mildly elevated blood pressure at 159/74.  Renal function continued to improve with creatinine at 1.69.  Mild hypophosphatemia which will be managed with TPN.  Currently tolerating full liquid diet.  12/20: Remained hemodynamically stable, having bowel movement with minimal abdominal pain and vomiting.  Surgery was scheduled for later today and she refused again. Surgery signed off .  Later also got report from anesthesia during preoperative evaluation for this surgery that she will be a very high risk for perioperative complications due to multiple comorbidities and morbid obesity and they were recommending going to a tertiary care center for any future surgical procedures.  Had a long discussion with patient that she need to go to a tertiary care center if her symptoms persist.  She will most likely need a surgical procedure to help with her symptoms as she has persistent due to persistent SBO which is most likely secondary to adhesions.  Patient seems understanding.  Native care was also consulted.  In the late afternoon received another message by nursing staff that patient now wants to proceed with surgery tomorrow.  Surgery has already signed off and will not be able to proceed with her surgical request due to no slot available in OR and her being very high risk for  anesthesia.  We will try stopping TPN and if she continues to tolerate at least clear liquids she can be discharged home tomorrow so she can follow-up with a tertiary care center later at her convenience.  12/21: TPN was discontinued and she was started on full liquid which she was having difficulty to tolerate and started nausea and vomiting along with abdominal pain.  Surgery tried transfer to [place] but unfortunately nobody is willing to accept her and there is also no bed availability.  Surgery signed off as they will not be able to help anymore.  Anesthesia is not comfortable to put her under for any procedure.  We had a lengthy discussion with patient and she has to go to [facility] herself if her conditions worsens. We will keep her on clear liquid and see how she does overnight before discharge.   Assessment and Plan: * SBO (small bowel obstruction) (HCC) CT showing small bowel obstruction with transition zone in the ileum Patient repeatedly refused NG tube placement ordered by ED after an initial unsuccessful attempt General surgery was consulted and they were recommending conservative management initially and then advising surgery as she did not improve after many days and repeat imaging with persistent bowel obstruction. Repeat CT abdomen with worsening of SBO and transition point, concern of adhesion.  Unable to tolerate full liquid diet so she was made n.p.o. again. Repeat imaging  with persistent obstruction, patient would like to try a full liquid diet before thinking about surgery.  So diet was switched to full liquid. -TPN was discontinued -Surgery signed off today.  AMS (altered mental status) Resolved Patient transiently became altered and febrile overnight.  Worsening leukocytosis, has been resolved Chest x-ray negative for pneumonia or pulmonary edema. CT head negative for any acute intracranial abnormality. Procalcitonin elevated at 1.11.  UA with microscopic  hematuria. Urine cultures with Enterococcus faecalis and blood cultures negative so far.  CT abdomen with worsening of SBO and no other acute abnormality.  She was given IV fluid and started on broad-spectrum antibiotics. -Continue Zosyn--completed total of 7 days course  CKD (chronic kidney disease) stage 4, GFR 15-29 ml/min (HCC) AKI with CKD stage IV. Renal function continued to improve with creatinine currently at 1.69 with baseline around 2.29. Nephrology was also consulted.  No contrast exposure.  Concern of ATN. -Nephrology would like to continue IV fluid -Monitor renal function -Avoid nephrotoxins  Chronic diastolic CHF (congestive heart failure) (HCC) Clinically euvolemic to dry Monitor for fluid overload in view of IV hydration with as needed IV Lasix Daily weights  Essential hypertension Blood pressure within goal Hydralazine IV as needed for elevated blood pressure  -Home antihypertensives are being currently held due to AKI and SBO  Chronic respiratory failure with hypoxia and hypercapnia (HCC) O2 requirement at baseline Continue supplemental oxygen  Obstructive lung disease (generalized) ([place]) Not acutely exacerbated Continue Pulmicort twice daily with DuoNebs as needed  Morbid obesity with BMI of 50.0-59.9, adult (HCC) Estimated body mass index is 61.31 kg/m as calculated from the following:   Height as of this encounter: '5\' 1"'$  (1.549 m).   Weight as of this encounter: 194.1 kg.   -Complicating factor to overall prognosis and care  OSA  CPAP nightly   Subjective: Patient was tolerating clear liquids well and having bowel movement when seen during morning rounds.  She was requesting to try a full liquid before discharge so diet was advanced but unfortunately she was unable to tolerate it.  Physical Exam: Vitals:   05/09/22 0411 05/09/22 0500 05/09/22 0801 05/09/22 1641  BP: (!) 145/61  (!) 133/44 (!) 124/54  Pulse: 98  90 94  Resp: '20  16 16  '$ Temp: 98.3  F (36.8 C)  99.1 F (37.3 C) 98.1 F (36.7 C)  TempSrc: Oral  Oral Oral  SpO2: 97%  99% 95%  Weight:  (!) 152 kg    Height:       General.  Morbidly obese lady, in no acute distress. Pulmonary.  Lungs clear bilaterally, normal respiratory effort. CV.  Regular rate and rhythm, no JVD, rub or murmur. Abdomen.  Soft, nontender, nondistended, BS positive. CNS.  Alert and oriented .  No focal neurologic deficit. Extremities.  No edema, no cyanosis, pulses intact and symmetrical. Psychiatry.  Judgment and insight appears normal.   Data Reviewed: Prior data reviewed  Family Communication:   Disposition: Status is: Inpatient Remains inpatient appropriate because: Severity of illness-patient is refusing most of the medical care suggested which is resulted in prolonged length of stay.  Planned Discharge Destination: Home with home health  DVT prophylaxis.  Heparin subcu Time spent: 40 minutes  This record has been created using Systems analyst. Errors have been sought and corrected,but may not always be located. Such creation errors do not reflect on the standard of care.   Author: Lorella Nimrod, MD 05/09/2022 6:04 PM  For on call  review [email or web address].

## 2022-05-09 NOTE — Telephone Encounter (Signed)
Still admitted.  Will continue to f/u.

## 2022-05-09 NOTE — Plan of Care (Signed)
  Problem: Clinical Measurements: Goal: Respiratory complications will improve Outcome: Progressing Goal: Cardiovascular complication will be avoided Outcome: Progressing   Problem: Activity: Goal: Risk for activity intolerance will decrease Outcome: Progressing   Problem: Nutrition: Goal: Adequate nutrition will be maintained Outcome: Progressing   Problem: Safety: Goal: Ability to remain free from injury will improve Outcome: Progressing   Problem: Fluid Volume: Goal: Hemodynamic stability will improve Outcome: Progressing

## 2022-05-10 DIAGNOSIS — J9611 Chronic respiratory failure with hypoxia: Secondary | ICD-10-CM

## 2022-05-10 DIAGNOSIS — G4733 Obstructive sleep apnea (adult) (pediatric): Secondary | ICD-10-CM

## 2022-05-10 DIAGNOSIS — N184 Chronic kidney disease, stage 4 (severe): Secondary | ICD-10-CM

## 2022-05-10 DIAGNOSIS — I10 Essential (primary) hypertension: Secondary | ICD-10-CM | POA: Diagnosis not present

## 2022-05-10 DIAGNOSIS — K56609 Unspecified intestinal obstruction, unspecified as to partial versus complete obstruction: Secondary | ICD-10-CM | POA: Diagnosis not present

## 2022-05-10 DIAGNOSIS — I5032 Chronic diastolic (congestive) heart failure: Secondary | ICD-10-CM | POA: Diagnosis not present

## 2022-05-10 DIAGNOSIS — J9612 Chronic respiratory failure with hypercapnia: Secondary | ICD-10-CM

## 2022-05-10 LAB — PHOSPHORUS: Phosphorus: 3.4 mg/dL (ref 2.5–4.6)

## 2022-05-10 LAB — BASIC METABOLIC PANEL
Anion gap: 7 (ref 5–15)
BUN: 52 mg/dL — ABNORMAL HIGH (ref 8–23)
CO2: 27 mmol/L (ref 22–32)
Calcium: 8.7 mg/dL — ABNORMAL LOW (ref 8.9–10.3)
Chloride: 101 mmol/L (ref 98–111)
Creatinine, Ser: 1.9 mg/dL — ABNORMAL HIGH (ref 0.44–1.00)
GFR, Estimated: 28 mL/min — ABNORMAL LOW (ref >60)
Glucose, Bld: 100 mg/dL — ABNORMAL HIGH (ref 70–99)
Potassium: 4.3 mmol/L (ref 3.5–5.1)
Sodium: 135 mmol/L (ref 135–145)

## 2022-05-10 LAB — MAGNESIUM: Magnesium: 1.7 mg/dL (ref 1.7–2.4)

## 2022-05-10 MED ORDER — CHLORHEXIDINE GLUCONATE CLOTH 2 % EX PADS
6.0000 | MEDICATED_PAD | Freq: Every day | CUTANEOUS | Status: DC
  Administered 2022-05-12: 6 via TOPICAL

## 2022-05-10 MED ORDER — ENSURE ENLIVE PO LIQD: 237.0000 mL | Freq: Three times a day (TID) | ORAL | 12 refills | Status: DC

## 2022-05-10 MED ORDER — OXYCODONE HCL 7.5 MG PO TABS: 7.5000 mg | ORAL_TABLET | Freq: Four times a day (QID) | ORAL | 0 refills | Status: DC | PRN

## 2022-05-10 MED ORDER — MAGNESIUM SULFATE 2 GM/50ML IV SOLN
2.0000 g | Freq: Once | INTRAVENOUS | Status: AC
  Administered 2022-05-10: 2 g via INTRAVENOUS
  Filled 2022-05-10: qty 50

## 2022-05-10 MED ORDER — ONDANSETRON HCL 4 MG PO TABS: 4.0000 mg | ORAL_TABLET | Freq: Every day | ORAL | 1 refills | Status: DC | PRN

## 2022-05-10 MED ORDER — TORSEMIDE 20 MG PO TABS: 40.0000 mg | ORAL_TABLET | Freq: Every day | ORAL | Status: DC

## 2022-05-10 MED ORDER — PROMETHAZINE HCL 25 MG PO TABS: 25.0000 mg | ORAL_TABLET | Freq: Four times a day (QID) | ORAL | 1 refills | Status: DC | PRN

## 2022-05-10 NOTE — TOC Transition Note (Addendum)
Transition of Care [facility]) - CM/SW Discharge Note   Patient Details  Name: Samantha Mason MRN: [id number] Date of Birth: 08-04-1950  Transition of Care [facility]) CM/SW Contact:  Magnus Ivan, LCSW Phone Number: 05/10/2022, 12:20 PM   Clinical Narrative:    Notified Jason with [facility] that patient has orders to discharge today. Asked MD for [facility] orders.   1:25- Notified by RN that patient plans to appeal her DC. Notified TOC Chemical engineer.   Final next level of care: [place] Barriers to Discharge: Barriers Resolved   Patient Goals and CMS Choice      Discharge Placement                         Discharge Plan and Services Additional resources added to the After Visit Summary for                            [facility] Arranged: RN [place]: [place] ([place]) Date [facility] Agency Contacted: 05/10/22   Representative spoke with at [place]: [place] ([place]) Interventions [place]: No Food Insecurity (04/27/2022)  Housing: Low Risk  (04/27/2022)  Transportation Needs: No Transportation Needs (04/27/2022)  Utilities: Not At Risk (04/27/2022)  Alcohol Screen: Low Risk  (08/08/2021)  Depression (PHQ2-9): Medium Risk (02/25/2022)  Financial Resource Strain: Medium Risk (08/14/2021)  Physical Activity: Inactive (08/08/2021)  Social Connections: Socially Isolated (08/08/2021)  Stress: No Stress Concern Present (08/08/2021)  Tobacco Use: Low Risk  (05/08/2022)     Readmission Risk Interventions     No data to display

## 2022-05-10 NOTE — Care Management Important Message (Signed)
Important Message  Patient Details  Name: Samantha Mason MRN: [id number] Date of Birth: 02/27/1951   Medicare Important Message Given:  Yes     Juliann Pulse A [name] 05/10/2022, 1:37 PM

## 2022-05-10 NOTE — Discharge Summary (Signed)
Physician Discharge Summary   Patient: Samantha Mason MRN: [id number] DOB: Jul 15, 1950  Admit date:     04/26/2022  Discharge date: 05/10/22  Discharge Physician: Samantha Mason   PCP: Samantha Hess, MD   Recommendations at discharge:  Please obtain CBC and BMP on follow-up Patient need to go to bariatric surgery department at [facility] if still unable to tolerate much p.o. as she will need surgical intervention which cannot be provided at [facility].  Discharge Diagnoses: Principal Problem:   SBO (small bowel obstruction) (HCC) Active Problems:   AMS (altered mental status)   CKD (chronic kidney disease) stage 4, GFR 15-29 ml/min (HCC)   Chronic diastolic CHF (congestive heart failure) (HCC)   Essential hypertension   Chronic respiratory failure with hypoxia and hypercapnia (HCC)   Obstructive lung disease (generalized) ([place])   Morbid obesity with BMI of 50.0-59.9, adult ([place])   OSA    Hospital Course: Taken from prior note.  Samantha Mason is a 71 y.o. female with medical history significant for diastolic CHF, and COPD with chronic respiratory failure on 2 L nasal cannula as well as obstructive sleep apnea on CPAP, stage IIIb chronic kidney disease, hypertension and morbid obesity who presents to the ED with a 3-day history of crampy upper abdominal pain and a 1 day history of nonbloody nonbilious vomiting.  She denies fever or chills, diarrhea or dysuria.  Denies chest pain or shortness of breath. ED course and data review: BP 160/83 with pulse of 103 and otherwise normal vitals.  Labs with WBC 15,200.  Sodium 130, creatinine2.29, up from baseline of 1.96.  Lipase and LFTs within normal limits.  CT abdomen and pelvis showing SBO as described below: IMPRESSION: 1. Small-bowel obstruction with transition zone in the ileum. Specific etiology is not identified. 2. Aortic atherosclerosis.  Patient was admitted for SBO, general surgery was consulted and they advised  conservative management.  Patient continued to refuse NG tube placement.  Worsening renal function. Nephrology was also consulted  12/12: Overnight patient became febrile with altered mental status.  Sepsis workup and protocol initiated by overnight provider.  No obvious source of infection so far.  CT head was negative for any acute intracranial abnormality.  Labs with worsening leukocytosis, creatinine and procalcitonin at 1.99.  She received fluid boluses and started on broad-spectrum antibiotics which include cefepime, Flagyl and vancomycin. Discontinuing vancomycin due to worsening renal function and no current diagnosis of MRSA Switching cefepime and Flagyl with Zosyn. Blood cultures still pending. CT abdomen and pelvis ordered-still pending. Worsening renal function which continued to get worse despite getting some IV fluid. Might need starting of dialysis, concern of ATN.  Nephrology is on board.  12/13: Mild tachycardia and low 100s, leukocytosis resolved, hemoglobin stable, renal function with a slight improvement of creatinine to 4.96 and GFR of 9 from 8.  Patient started getting BM and passing flatus, nausea and vomiting improved today. CT abdomen with concern of worsening SBO with transition point in the anterior mid abdomen, likely due to adhesion.  No other significant abnormality-awaiting surgery team response while restarting clear liquid diet.  12/14: Hemodynamically stable, renal functions continue to improve with creatinine of 3.8 today and GFR improved to 12.  Diet was advanced to full liquid.  Hypomagnesemia resolved.  Surgery would like to monitor for another day.  12/15: Patient again developed nausea and vomiting so she was made n.p.o. by surgery.  Repeat abdominal x-ray with persistent small bowel dilation consistent with obstruction.  Having bowel movements and passing flatus.  Surgery advised n.p.o. and TPN.  Patient currently refusing PICC line placement and TPN and would  like to continue trying p.o. she was also refusing surgery saying that she does not want to take any risk. Difficult patient as she is refusing most of the medical care advised. Discussed with her son and he will try discussing with her as it is just prolonging her length of stay without any meaningful intervention or help. Renal functions continue to improve, creatinine at 3.18 with GFR improved to 15 today.  12/16: Hemodynamically stable.  Renal functions continue to improve with creatinine at 2.85, and GFR of 17.  Electrolytes within normal limit.  Agreed to proceed with PICC line for TPN.  She is having bowel movement but continued to have nausea.  She would like to wait and still not ready for any surgery.  12/17: Hemodynamically stable, renal function continued to improve and appears to be at baseline now with creatinine of 2.31.  Nephrology is recommending continuation of LR at this time.  She was started on TPN.  Continue to have bowel movements and passing flatus, nausea and vomiting improving.  Surgery will continue keeping her n.p.o. for bowel rest.  12/18: Patient clinically having bowel movements and feeling some improvement, case to be with persistently dilated bowel, she is still hesitant to proceed with surgery and would like to try a full liquid diet 1 more time before deciding.  Surgery advance her diet to full liquid. Will continue with TPN  12/19: Hemodynamically stable, mildly elevated blood pressure at 159/74.  Renal function continued to improve with creatinine at 1.69.  Mild hypophosphatemia which will be managed with TPN.  Currently tolerating full liquid diet.  12/20: Remained hemodynamically stable, having bowel movement with minimal abdominal pain and vomiting.  Surgery was scheduled for later today and she refused again. Surgery signed off .  Later also got report from anesthesia during preoperative evaluation for this surgery that she will be a very high risk for  perioperative complications due to multiple comorbidities and morbid obesity and they were recommending going to a tertiary care center for any future surgical procedures.  Had a long discussion with patient that she need to go to a tertiary care center if her symptoms persist.  She will most likely need a surgical procedure to help with her symptoms as she has persistent due to persistent SBO which is most likely secondary to adhesions.  Patient seems understanding.  Native care was also consulted.  In the late afternoon received another message by nursing staff that patient now wants to proceed with surgery tomorrow.  Surgery has already signed off and will not be able to proceed with her surgical request due to no slot available in OR and her being very high risk for anesthesia.  We will try stopping TPN and if she continues to tolerate at least clear liquids she can be discharged home tomorrow so she can follow-up with a tertiary care center later at her convenience.  12/21: TPN was discontinued and she was started on full liquid which she was having difficulty to tolerate and started nausea and vomiting along with abdominal pain.  Surgery tried transfer to [place] but unfortunately nobody is willing to accept her and there is also no bed availability.  Surgery signed off as they will not be able to help anymore.  Anesthesia is not comfortable to put her under for any procedure.  We had a lengthy discussion with patient and she has to go to [facility] herself if her conditions worsens. We will keep her on clear liquid and see how she does overnight before discharge.  12/22: Patient with some improvement in nausea and vomiting this morning, still mildly nauseated and able to tolerate Jell-O and some clear liquids.  Continue to have watery bowel movements and passing flatus.  Per patient it is more gas than BM. Slight worsening of renal function, holding torsemide today and we will resume at a  lower dose from tomorrow.  Continue to have lower extremity edema.  Had another discussion with patient and she would like to be discharged so she can seek medical attention at the hospital of her choice.  Unfortunately we have tried but will not be able to help based on her comorbidities and BMI.  We tried transfer but remained unsuccessful.  She has been given Zofran, Phenergan and pain medications and she will follow-up at [facility] for further assistance.  Patient need bariatric surgery department to help her.  She will continue on rest of home medications and will follow-up with her providers.   Assessment and Plan: * SBO (small bowel obstruction) (HCC) CT showing small bowel obstruction with transition zone in the ileum Patient repeatedly refused NG tube placement ordered by ED after an initial unsuccessful attempt General surgery was consulted and they were recommending conservative management initially and then advising surgery as she did not improve after many days and repeat imaging with persistent bowel obstruction. Repeat CT abdomen with worsening of SBO and transition point, concern of adhesion.  Unable to tolerate full liquid diet so she was made n.p.o. again. Repeat imaging with persistent obstruction, patient would like to try a full liquid diet before thinking about surgery.  So diet was switched to full liquid. -TPN was discontinued -Surgery signed off today.  AMS (altered mental status) Resolved Patient transiently became altered and febrile overnight.  Worsening leukocytosis, has been resolved Chest x-ray negative for pneumonia or pulmonary edema. CT head negative for any acute intracranial abnormality. Procalcitonin elevated at 1.11.  UA with microscopic hematuria. Urine cultures with Enterococcus faecalis and blood cultures negative so far.  CT abdomen with worsening of SBO and no other acute abnormality.  She was given IV fluid and started on broad-spectrum  antibiotics. -Continue Zosyn--completed total of 7 days course  CKD (chronic kidney disease) stage 4, GFR 15-29 ml/min (HCC) AKI with CKD stage IV. Renal function continued to improve with creatinine currently at 1.69 with baseline around 2.29. Nephrology was also consulted.  No contrast exposure.  Concern of ATN. -Nephrology would like to continue IV fluid -Monitor renal function -Avoid nephrotoxins  Chronic diastolic CHF (congestive heart failure) (HCC) Clinically euvolemic to dry Monitor for fluid overload in view of IV hydration with as needed IV Lasix Daily weights  Essential hypertension Blood pressure within goal Hydralazine IV as needed for elevated blood pressure  -Home antihypertensives are being currently held due to AKI and SBO  Chronic respiratory failure with hypoxia and hypercapnia (HCC) O2 requirement at baseline Continue supplemental oxygen  Obstructive lung disease (generalized) ([place]) Not acutely exacerbated Continue Pulmicort twice daily with DuoNebs as needed  Morbid obesity with BMI of 50.0-59.9, adult (HCC) Estimated body mass index is 61.31 kg/m as calculated from the following:   Height as of this encounter: '5\' 1"'$  (1.549 m).   Weight as of this encounter: 381.0 kg.   -Complicating factor to overall prognosis and  care  OSA  CPAP nightly        Pain control - [facility] Controlled Substance Reporting System database was reviewed. and patient was instructed, not to drive, operate heavy machinery, perform activities at heights, swimming or participation in water activities or provide baby-sitting services while on Pain, Sleep and Anxiety Medications; until their outpatient Physician has advised to do so again. Also recommended to not to take more than prescribed Pain, Sleep and Anxiety Medications.  Consultants: General surgery, nephrology Procedures performed: None Disposition: Home Diet recommendation: Liquid diet Discharge Diet Orders (From  admission, onward)     Start     Ordered   05/10/22 0000  Diet - low sodium heart healthy        05/10/22 1148           Cardiac and Carb modified diet DISCHARGE MEDICATION: Allergies as of 05/10/2022       Reactions   Nuvigil [armodafinil] Hives   Penicillins Diarrhea, Nausea And Vomiting   Did it involve swelling of the face/tongue/throat, SOB, or low BP? no Did it involve sudden or severe rash/hives, skin peeling, or any reaction on the inside of your mouth or nose? No Did you need to seek medical attention at a hospital or doctor's office? No When did it last happen?  in her 3s    If all above answers are "NO", may proceed with cephalosporin use.   Spironolactone Itching   Gabapentin Other (See Comments)   "wiped her out, couldn't stay awake"   Melatonin Other (See Comments)   "Brain fog"   Provigil [modafinil] Hives   Entresto [sacubitril-valsartan] Itching   Lisinopril Cough        Medication List     TAKE these medications    acetaminophen 500 MG tablet Commonly known as: TYLENOL Take 1,000 mg by mouth every 6 (six) hours as needed.   benzonatate 100 MG capsule Commonly known as: TESSALON TAKE 1 CAPSULE BY MOUTH THREE TIMES A DAY AS NEEDED FOR COUGH   budesonide 0.5 MG/2ML nebulizer solution Commonly known as: PULMICORT TAKE 2 ML (0.5 MG TOTAL) BY NEBULIZATION TWICE A DAY   calcitRIOL 0.25 MCG capsule Commonly known as: ROCALTROL Take 0.25 mcg by mouth daily.   dapagliflozin propanediol 10 MG Tabs tablet Commonly known as: Farxiga Take 1 tablet (10 mg total) by mouth daily before breakfast.   diltiazem 240 MG 24 hr capsule Commonly known as: CARDIZEM CD Take 1 capsule (240 mg total) by mouth daily.   feeding supplement Liqd Take 237 mLs by mouth 3 (three) times daily between meals.   ipratropium-albuterol 0.5-2.5 (3) MG/3ML Soln Commonly known as: DUONEB TAKE 3 MLS BY NEBULIZATION EVERY 6 (SIX) HOURS AS NEEDED. J45.40   metolazone 2.5 MG  tablet Commonly known as: ZAROXOLYN Take 1 tablet (2.5 mg total) by mouth 2 (two) times a week.   NON FORMULARY Bipap - nightly with Oxygen 2 Lpm   ondansetron 4 MG tablet Commonly known as: Zofran Take 1 tablet (4 mg total) by mouth daily as needed for nausea or vomiting.   oxyCODONE HCl 7.5 MG Tabs Take 7.5 mg by mouth every 6 (six) hours as needed.   OXYGEN Inhale into the lungs. 2 LPM [place]   pantoprazole 40 MG tablet Commonly known as: PROTONIX TAKE 1 TABLET BY MOUTH EVERY DAY   Potassium Chloride ER 20 MEQ Tbcr Take 20 mEq by mouth 2 (two) times a week. On M & F with metolazone   Prevalite  4 g packet Generic drug: cholestyramine light DISSOLVE AND DRINK 1 PACKET BY MOUTH AT BEDTIME   promethazine 25 MG tablet Commonly known as: PHENERGAN Take 1 tablet (25 mg total) by mouth every 6 (six) hours as needed. What changed: how much to take   rOPINIRole 1 MG tablet Commonly known as: REQUIP Take 1 tablet (1 mg total) by mouth 3 (three) times daily.   torsemide 20 MG tablet Commonly known as: DEMADEX Take 2 tablets (40 mg total) by mouth daily. What changed: when to take this        Follow-up Information     Samantha Hess, MD. Schedule an appointment as soon as possible for a visit in 1 week(s).   Specialty: Internal Medicine Contact information: [address] [place] [address] [phone]                Discharge Exam: Danley Danker Weights   05/09/22 0500 05/10/22 0411 05/10/22 0852  Weight: (!) 152 kg (!) 149.9 kg (!) 147.5 kg   General.  Morbidly obese lady, in no acute distress. Pulmonary.  Lungs clear bilaterally, normal respiratory effort. CV.  Regular rate and rhythm, no JVD, rub or murmur. Abdomen.  Soft, nontender, nondistended, BS positive. CNS.  Alert and oriented .  No focal neurologic deficit. Extremities.  No edema, no cyanosis, pulses intact and symmetrical. Psychiatry.  Judgment and insight appears normal.    Condition at discharge: stable  The results of significant diagnostics from this hospitalization (including imaging, microbiology, ancillary and laboratory) are listed below for reference.   Imaging Studies: DG Abd 1 View  Result Date: 05/06/2022 CLINICAL DATA:  Small bowel obstruction.[id number]. EXAM: ABDOMEN - 1 VIEW COMPARISON:  05/03/2022 FINDINGS: There is persistent dilatation of small bowel loops in the central abdomen, similar in appearance to prior study. Relative normal appearance of the large bowel loops. Surgical clips are noted in the RIGHT UPPER QUADRANT of the abdomen. No evidence for free intraperitoneal air. IMPRESSION: Persistent dilatation of small bowel loops in the central abdomen compatible with small-bowel obstruction. Electronically Signed   By: Nolon Nations M.D.   On: 05/06/2022 09:00   [place] EKG SITE RITE  Result Date: 05/03/2022 If Site Rite image not attached, placement could not be confirmed due to current cardiac rhythm.  DG Abd 1 View  Result Date: 05/03/2022 CLINICAL DATA:  Abdominal pain EXAM: ABDOMEN - 1 VIEW COMPARISON:  Radiograph 05/01/2022, CT 05/01/2022 FINDINGS: Persistent small bowel dilation with decompressed colon, slightly improved from prior. Some residual contrast material is noted in the distal colon. IMPRESSION: Persistent small bowel dilation consistent with obstruction, slightly improved from prior. Electronically Signed   By: Maurine Simmering M.D.   On: 05/03/2022 08:39   DG Abd 1 View  Result Date: 05/01/2022 CLINICAL DATA:  Ileus, generalized abdominal pain. EXAM: ABDOMEN - 1 VIEW COMPARISON:  CT abdomen pelvis 05/01/2022 and abdominal radiographs 04/29/2022. FINDINGS: Persistent small bowel dilatation. Oral contrast is seen in nondilated colon, as on 05/01/2022. IMPRESSION: Small bowel obstruction. Electronically Signed   By: Lorin Picket M.D.   On: 05/01/2022 09:08   CT ABDOMEN PELVIS WO CONTRAST  Result Date: 05/01/2022 CLINICAL  DATA:  Abdominal pain.  Small-bowel obstruction.  Sepsis. EXAM: CT ABDOMEN AND PELVIS WITHOUT CONTRAST TECHNIQUE: Multidetector CT imaging of the abdomen and pelvis was performed following the standard protocol without IV contrast. RADIATION DOSE REDUCTION: This exam was performed according to the departmental dose-optimization program which includes automated exposure control, adjustment of  the mA and/or kV according to patient size and/or use of iterative reconstruction technique. COMPARISON:  04/26/2022 FINDINGS: Lower chest: No acute findings. Hepatobiliary: No mass visualized on this unenhanced exam. Prior cholecystectomy. No evidence of biliary obstruction. Pancreas: No mass or inflammatory process visualized on this unenhanced exam. Spleen:  Within normal limits in size. Adrenals/Urinary tract: No evidence of urolithiasis or hydronephrosis. Unremarkable unopacified urinary bladder. Stomach/Bowel: Moderately dilated fluid-filled small bowel loops have increased since previous study. Transition point is seen in the anterior mid abdomen, likely due to adhesion. No mass or inflammatory process identified. No abnormal fluid collections seen. Vascular/Lymphatic: No pathologically enlarged lymph nodes identified. No evidence of abdominal aortic aneurysm. Reproductive: Stable 2 cm fat attenuation mass in the uterus, consistent with a benign lipoleiomyoma. Adnexal regions are unremarkable. Other:  None. Musculoskeletal: No suspicious bone lesions identified. Incidental note is again made of Paget's disease involving the left hip. IMPRESSION: Worsening small bowel obstruction with transition point in the anterior mid abdomen, likely due to adhesion. No evidence of inflammatory process or abscess. Stable small uterine lipoleiomyoma. Electronically Signed   By: Marlaine Hind M.D.   On: 05/01/2022 09:03   DG Chest Port 1 View  Result Date: 04/30/2022 CLINICAL DATA:  Sepsis EXAM: PORTABLE CHEST 1 VIEW COMPARISON:   01/23/2022 FINDINGS: Streaky density and right diaphragm elevation that is chronic based on priors. No edema or acute opacity. No effusion or pneumothorax. Normal heart size and mediastinal contours. IMPRESSION: Chronic elevation of the right diaphragm with overlying lower lobe scarring. No edema or convincing pneumonia when compared to priors. Electronically Signed   By: Jorje Guild M.D.   On: 04/30/2022 04:34   CT HEAD WO CONTRAST (5MM)  Result Date: 04/30/2022 CLINICAL DATA:  Altered mental status EXAM: CT HEAD WITHOUT CONTRAST TECHNIQUE: Contiguous axial images were obtained from the base of the skull through the vertex without intravenous contrast. RADIATION DOSE REDUCTION: This exam was performed according to the departmental dose-optimization program which includes automated exposure control, adjustment of the mA and/or kV according to patient size and/or use of iterative reconstruction technique. COMPARISON:  01/11/2019 FINDINGS: Brain: There is no mass, hemorrhage or extra-axial collection. The size and configuration of the ventricles and extra-axial CSF spaces are normal. There is hypoattenuation of the white matter, most commonly indicating chronic small vessel disease. Vascular: No abnormal hyperdensity of the major intracranial arteries or dural venous sinuses. No intracranial atherosclerosis. Skull: The visualized skull base, calvarium and extracranial soft tissues are normal. Sinuses/Orbits: No fluid levels or advanced mucosal thickening of the visualized paranasal sinuses. No mastoid or middle ear effusion. The orbits are normal. IMPRESSION: 1. No acute intracranial abnormality. 2. Chronic small vessel disease. Electronically Signed   By: Ulyses Jarred M.D.   On: 04/30/2022 01:48   DG Abd 1 View  Result Date: 04/29/2022 CLINICAL DATA:  Small bowel obstruction. EXAM: ABDOMEN - 1 VIEW COMPARISON:  April 27, 2022. FINDINGS: Continued small bowel dilatation is noted which may be slightly  decreased compared to prior exam. Residual contrast is seen throughout the nondilated colon. IMPRESSION: Continued small bowel dilatation is noted which may be slightly decreased compared to prior exam, although residual contrast is noted throughout nondilated colon. Electronically Signed   By: Marijo Conception M.D.   On: 04/29/2022 08:11   US RENAL  Result Date: 04/28/2022 CLINICAL DATA:  Acute kidney injury. EXAM: RENAL / URINARY TRACT ULTRASOUND COMPLETE COMPARISON:  CT, 04/26/2022. FINDINGS: Right Kidney: Renal measurements: 9.2 x 4.6 x  4 cm = volume: 88 mL. Diffuse cortical thinning. Mild increased parenchymal echogenicity. No mass, stone or hydronephrosis. Left Kidney: Renal measurements: 10.4 x 4.6 x 3.3 cm = volume: 82 mL. Diffuse cortical thinning. Mild increased parenchymal echogenicity. No mass, stone or hydronephrosis. Bladder: Not visualized. Other: None. IMPRESSION: 1. Limited study due to the patient's body habitus. 2. Findings consistent with medical renal disease with bilateral renal cortical thinning and increased renal parenchymal echogenicity. No renal mass. No hydronephrosis. Electronically Signed   By: Lajean Manes M.D.   On: 04/28/2022 09:36   DG Abd Portable 1V-Small Bowel Obstruction Protocol-initial, 8 hr delay  Result Date: 04/27/2022 CLINICAL DATA:  Small-bowel obstruction, 8 hour follow-up film EXAM: PORTABLE ABDOMEN - 1 VIEW COMPARISON:  CT from the previous day. FINDINGS: Scattered large and small bowel gas is noted. Persistent small bowel dilatation is noted proximally. Administered contrast lies predominately within the stomach although some contrast is noted within the small bowel. This extends distally into the ileum beyond the transition zone seen on prior CT examination. Minimal contrast is noted within the right colon. No free air is seen. No other focal abnormality is noted. IMPRESSION: Administered contrast has reached the right colon although persistent small bowel  dilatation remains. Electronically Signed   By: Inez Catalina M.D.   On: 04/27/2022 20:00   CT ABDOMEN PELVIS WO CONTRAST  Result Date: 04/26/2022 CLINICAL DATA:  Abdominal pain, acute, nonlocalized. EXAM: CT ABDOMEN AND PELVIS WITHOUT CONTRAST TECHNIQUE: Multidetector CT imaging of the abdomen and pelvis was performed following the standard protocol without IV contrast. RADIATION DOSE REDUCTION: This exam was performed according to the departmental dose-optimization program which includes automated exposure control, adjustment of the mA and/or kV according to patient size and/or use of iterative reconstruction technique. COMPARISON:  Ultrasound 07/24/2021.  CT 08/30/2010 FINDINGS: Lower chest: Mild atelectasis or scarring at the right lung base. Hepatobiliary: Liver parenchyma appears normal without contrast. Previous cholecystectomy. No sign of ductal dilatation given that. Pancreas: Normal Spleen: Normal Adrenals/Urinary Tract: Adrenal glands are normal. Kidneys are normal. Bladder is normal. Stomach/Bowel: Stomach is fluid-filled. Numerous dilated fluid and air-filled loops of small intestine with transition zone in the ileum consistent with small bowel obstruction. Specific etiology is not identified. No abnormal colon finding. Vascular/Lymphatic: Aortic atherosclerosis, mild. No aneurysm. IVC is normal. No adenopathy. Reproductive: No adnexal mass.  Small uterine lipoma. Other: No free fluid or air. Musculoskeletal: Ordinary mild spinal degenerative changes. IMPRESSION: 1. Small-bowel obstruction with transition zone in the ileum. Specific etiology is not identified. 2. Aortic atherosclerosis. Aortic Atherosclerosis (ICD10-I70.0). Electronically Signed   By: Nelson Chimes M.D.   On: 04/26/2022 18:32    Microbiology: Results for orders placed or performed during the hospital encounter of 04/26/22  Culture, blood (Routine X 2) w Reflex to ID Panel     Status: None   Collection Time: 04/30/22  4:18 AM    Specimen: BLOOD LEFT ARM  Result Value Ref Range Status   Specimen Description BLOOD LEFT ARM  Final   Special Requests   Final    BOTTLES DRAWN AEROBIC AND ANAEROBIC Blood Culture adequate volume   Culture   Final    NO GROWTH 5 DAYS Performed at [facility], [address] [place], [place] [address]    Report Status 05/05/2022 FINAL  Final  Culture, blood (Routine X 2) w Reflex to ID Panel     Status: None   Collection Time: 04/30/22  5:04 AM   Specimen: BLOOD  LEFT ARM  Result Value Ref Range Status   Specimen Description BLOOD LEFT ARM  Final   Special Requests   Final    BOTTLES DRAWN AEROBIC AND ANAEROBIC Blood Culture adequate volume   Culture   Final    NO GROWTH 5 DAYS Performed at [facility], [address] [place], [place] [address]    Report Status 05/05/2022 FINAL  Final  Urine Culture     Status: Abnormal   Collection Time: 04/30/22 10:02 AM   Specimen: Urine, Clean Catch  Result Value Ref Range Status   Specimen Description   Final    URINE, CLEAN CATCH Performed at [facility], [address], [place], [place] [address]    Special Requests   Final    NONE Performed at [facility], [place], [place] [address]    Culture 40,000 COLONIES/mL ENTEROCOCCUS FAECIUM (A)  Final   Report Status 05/02/2022 FINAL  Final   Organism ID, Bacteria ENTEROCOCCUS FAECIUM (A)  Final      Susceptibility   Enterococcus faecium - MIC*    AMPICILLIN <=2 SENSITIVE Sensitive     NITROFURANTOIN 64 INTERMEDIATE Intermediate     VANCOMYCIN <=0.5 SENSITIVE Sensitive     * 40,000 COLONIES/mL ENTEROCOCCUS FAECIUM    Labs: CBC: No results for input(s): "WBC", "NEUTROABS", "HGB", "HCT", "MCV", "PLT" in the last 168 hours. Basic Metabolic Panel: Recent Labs  Lab 05/06/22 0325 05/07/22 0643 05/08/22 0708 05/09/22 0621 05/10/22 0551  NA 139 140 140 139 135  K 4.0 4.0 4.7 4.4 4.3  CL 104 106 108 104 101  CO2 '27 28 26 25  27  '$ GLUCOSE 131* 127* 126* 106* 100*  BUN 53* 57* 53* 52* 52*  CREATININE 1.93* 1.69* 1.35* 1.56* 1.90*  CALCIUM 8.8* 9.2 8.6* 8.7* 8.7*  MG 1.8 1.8 2.3 1.9 1.7  PHOS 2.4* 2.3* 4.1 3.8 3.4   Liver Function Tests: Recent Labs  Lab 05/04/22 0510 05/06/22 0325 05/09/22 0621  AST 12* 18 12*  ALT '14 15 11  '$ ALKPHOS 71 59 58  BILITOT 0.6 0.3 0.8  PROT 6.6 5.8* 6.4*  ALBUMIN 2.8* 2.5* 2.8*   CBG: Recent Labs  Lab 05/07/22 2355 05/08/22 0512 05/08/22 0747 05/08/22 1133 05/08/22 1628  GLUCAP 128* 101* 118* 132* 114*    Discharge time spent: greater than 30 minutes.  This record has been created using Systems analyst. Errors have been sought and corrected,but may not always be located. Such creation errors do not reflect on the standard of care.   Signed: Lorella Nimrod, MD [facility] 05/10/2022

## 2022-05-10 NOTE — Progress Notes (Signed)
Per primary RN Misty, PICC removal is not needed at this time as pt is not being discharged.  The IV team consult will be closed.

## 2022-05-10 NOTE — Progress Notes (Signed)
Palliative: Chart review completed.  Samantha Mason is to discharge today.   Plan: Full scope/full code.  Active with [facility] for home health services.  Declines outpatient palliative services.  Will rehospitalize as needed.  No charge Quinn Axe, NP Palliative medicine team Team phone [phone] Greater than 50% of this time was spent counseling and coordinating care related to the above assessment and plan.

## 2022-05-10 NOTE — Plan of Care (Signed)
  Problem: Clinical Measurements: Goal: Diagnostic test results will improve Outcome: Not Progressing   Problem: Activity: Goal: Risk for activity intolerance will decrease Outcome: Progressing   Problem: Nutrition: Goal: Adequate nutrition will be maintained Outcome: Not Progressing   Problem: Coping: Goal: Level of anxiety will decrease Outcome: Not Progressing   Problem: Elimination: Goal: Will not experience complications related to bowel motility Outcome: Not Progressing Goal: Will not experience complications related to urinary retention Outcome: Progressing   Problem: Pain Managment: Goal: General experience of comfort will improve Outcome: Not Progressing   Problem: Safety: Goal: Ability to remain free from injury will improve Outcome: Progressing   Problem: Skin Integrity: Goal: Risk for impaired skin integrity will decrease Outcome: Progressing

## 2022-05-10 NOTE — Progress Notes (Addendum)
[facility]  PROGRESS NOTE   Subjective:   Reconsult today for worsening creatinine. Patient lying in the bed when seen.  Reports that she still has pain in the mid abdomen.  She is tolerating Jell-O and some clears.  She continues to have some lower extremity edema.    Objective:  Vital signs: Blood pressure (!) 125/59, pulse 98, temperature 98.3 F (36.8 C), temperature source Oral, resp. rate 20, height '5\' 1"'$  (1.549 m), weight (!) 147.5 kg, SpO2 93 %.  Intake/Output Summary (Last 24 hours) at 05/10/2022 1729 Last data filed at 05/10/2022 1359 Gross per 24 hour  Intake 980 ml  Output --  Net 980 ml    Filed Weights   05/09/22 0500 05/10/22 0411 05/10/22 0852  Weight: (!) 152 kg (!) 149.9 kg (!) 147.5 kg     Physical Exam: General:  No acute distress  Head:  Normocephalic, atraumatic. Moist oral mucosal membranes  Eyes:  Anicteric  Lungs:   Clear to auscultation, normal effort  Heart:  S1S2 no rubs  Abdomen:   Soft, obese  Extremities: 1+ peripheral edema.    Neurologic:  Awake, alert, following commands  Skin:  No lesions       Basic Metabolic Panel: Recent Labs  Lab 05/06/22 0325 05/07/22 0643 05/08/22 0708 05/09/22 0621 05/10/22 0551  NA 139 140 140 139 135  K 4.0 4.0 4.7 4.4 4.3  CL 104 106 108 104 101  CO2 '27 28 26 25 27  '$ GLUCOSE 131* 127* 126* 106* 100*  BUN 53* 57* 53* 52* 52*  CREATININE 1.93* 1.69* 1.35* 1.56* 1.90*  CALCIUM 8.8* 9.2 8.6* 8.7* 8.7*  MG 1.8 1.8 2.3 1.9 1.7  PHOS 2.4* 2.3* 4.1 3.8 3.4     CBC: No results for input(s): "WBC", "NEUTROABS", "HGB", "HCT", "MCV", "PLT" in the last 168 hours.    Urinalysis: No results for input(s): "COLORURINE", "LABSPEC", "PHURINE", "GLUCOSEU", "HGBUR", "BILIRUBINUR", "KETONESUR", "PROTEINUR", "UROBILINOGEN", "NITRITE", "LEUKOCYTESUR" in the last 72 hours.  Invalid input(s): "APPERANCEUR"    Imaging: No results found.   Medications:    lactated ringers     promethazine  (PHENERGAN) injection (IM or IVPB) Stopped (05/10/22 0456)    budesonide  0.5 mg Nebulization BID   feeding supplement  237 mL Oral TID BM   ipratropium-albuterol  3 mL Nebulization BID   metolazone  2.5 mg Oral Once per day on Mon Thu   pantoprazole (PROTONIX) IV  40 mg Intravenous Q12H   rOPINIRole  1 mg Oral TID    Assessment/ Plan:     Principal Problem:   SBO (small bowel obstruction) (HCC) Active Problems:   Morbid obesity with BMI of 50.0-59.9, adult (HCC)   OSA    Essential hypertension   Obstructive lung disease (generalized) (HCC)   Chronic diastolic CHF (congestive heart failure) (HCC)   Chronic respiratory failure with hypoxia and hypercapnia (HCC)   CKD (chronic kidney disease) stage 4, GFR 15-29 ml/min (HCC)   AMS (altered mental status)  Samantha Mason is a 71 year old female with past medical history including COPD with renal failure requiring 2 L oxygen as needed, chronic diastolic heart failure, sleep apnea, morbid obesity, hypertension, and chronic kidney disease stage IV. Patient presents to the emergency department complaining of abdominal pain and has been admitted for Small bowel obstruction ([place]) [K56.609] SBO (small bowel obstruction) ([place]) [K56.609]   Patient is known to our practice and is followed by Dr Holley Raring outpatient. She was last seen in office  on 04/17/22.   #1: Acute kidney injury on chronic kidney disease stage IV:  Patient has recurrent episode of Acute kidney injury.  Unclear reason but may be related to diuretic. Will discontinue torsemide for now.   #2: Lower extremity edema Managed with low-salt diet and appropriate fluid restriction. Holding diuretic for now until oral intake improves.   #3: Chronic shortness of breath/chronic respiratory failure: Continue CPAP at night with 2L [place] during the day.   #4: Small bowel obstruction: Patient was treated with TPN until recently.  She may seek surgical opinion at tertiary care center.      LOS: [facility] 12/22/20235:29 PM

## 2022-05-11 DIAGNOSIS — N184 Chronic kidney disease, stage 4 (severe): Secondary | ICD-10-CM | POA: Diagnosis not present

## 2022-05-11 DIAGNOSIS — I5032 Chronic diastolic (congestive) heart failure: Secondary | ICD-10-CM | POA: Diagnosis not present

## 2022-05-11 DIAGNOSIS — K56609 Unspecified intestinal obstruction, unspecified as to partial versus complete obstruction: Secondary | ICD-10-CM | POA: Diagnosis not present

## 2022-05-11 LAB — BASIC METABOLIC PANEL
Anion gap: 7 (ref 5–15)
BUN: 48 mg/dL — ABNORMAL HIGH (ref 8–23)
CO2: 28 mmol/L (ref 22–32)
Calcium: 8.5 mg/dL — ABNORMAL LOW (ref 8.9–10.3)
Chloride: 99 mmol/L (ref 98–111)
Creatinine, Ser: 2 mg/dL — ABNORMAL HIGH (ref 0.44–1.00)
GFR, Estimated: 26 mL/min — ABNORMAL LOW (ref >60)
Glucose, Bld: 107 mg/dL — ABNORMAL HIGH (ref 70–99)
Potassium: 4.1 mmol/L (ref 3.5–5.1)
Sodium: 134 mmol/L — ABNORMAL LOW (ref 135–145)

## 2022-05-11 LAB — MAGNESIUM: Magnesium: 2 mg/dL (ref 1.7–2.4)

## 2022-05-11 LAB — PHOSPHORUS: Phosphorus: 3.4 mg/dL (ref 2.5–4.6)

## 2022-05-11 MED ORDER — METOCLOPRAMIDE HCL 5 MG PO TABS
5.0000 mg | ORAL_TABLET | Freq: Three times a day (TID) | ORAL | Status: DC
  Administered 2022-05-11 – 2022-05-14 (×8): 5 mg via ORAL
  Filled 2022-05-11 (×10): qty 1

## 2022-05-11 MED ORDER — LACTULOSE 10 GM/15ML PO SOLN: 20.0000 g | Freq: Once | ORAL | Status: DC

## 2022-05-11 MED ORDER — OXYCODONE-ACETAMINOPHEN 5-325 MG PO TABS
1.0000 | ORAL_TABLET | ORAL | Status: DC | PRN
  Administered 2022-05-11 – 2022-05-13 (×4): 1 via ORAL
  Filled 2022-05-11 (×5): qty 1

## 2022-05-11 MED ORDER — ALBUMIN HUMAN 25 % IV SOLN
25.0000 g | Freq: Once | INTRAVENOUS | Status: AC
  Administered 2022-05-11: 25 g via INTRAVENOUS
  Filled 2022-05-11: qty 100

## 2022-05-11 MED ORDER — CALCIUM CARBONATE ANTACID 500 MG PO CHEW
400.0000 mg | CHEWABLE_TABLET | Freq: Four times a day (QID) | ORAL | Status: DC | PRN
  Administered 2022-05-11 (×2): 400 mg via ORAL
  Filled 2022-05-11 (×3): qty 2

## 2022-05-11 NOTE — Progress Notes (Signed)
[facility]  PROGRESS NOTE   Subjective:   Patient seen earlier today.  Sitting up in the chair.  States she is able to tolerate clears and Jell-O.  She will try soft solids today.  Continues to have residual lower extremity edema.  Denies any shortness of breath.   Objective:  Vital signs: Blood pressure 136/63, pulse 92, temperature 97.8 F (36.6 C), temperature source Oral, resp. rate 16, height '5\' 1"'$  (1.549 m), weight (!) 146.8 kg, SpO2 93 %.  Intake/Output Summary (Last 24 hours) at 05/11/2022 1205 Last data filed at 05/11/2022 0945 Gross per 24 hour  Intake 280 ml  Output --  Net 280 ml    Filed Weights   05/10/22 0411 05/10/22 0852 05/11/22 0500  Weight: (!) 149.9 kg (!) 147.5 kg (!) 146.8 kg     Physical Exam: General:  No acute distress  Head:  Normocephalic, atraumatic. Moist oral mucosal membranes  Eyes:  Anicteric  Lungs:   Clear to auscultation, normal effort  Heart:  S1S2 no rubs  Abdomen:   Soft, obese  Extremities: 1+ peripheral edema.    Neurologic:  Awake, alert, following commands  Skin:  No lesions       Basic Metabolic Panel: Recent Labs  Lab 05/07/22 0643 05/08/22 0708 05/09/22 0621 05/10/22 0551 05/11/22 0443  NA 140 140 139 135 134*  K 4.0 4.7 4.4 4.3 4.1  CL 106 108 104 101 99  CO2 '28 26 25 27 28  '$ GLUCOSE 127* 126* 106* 100* 107*  BUN 57* 53* 52* 52* 48*  CREATININE 1.69* 1.35* 1.56* 1.90* 2.00*  CALCIUM 9.2 8.6* 8.7* 8.7* 8.5*  MG 1.8 2.3 1.9 1.7 2.0  PHOS 2.3* 4.1 3.8 3.4 3.4     CBC: No results for input(s): "WBC", "NEUTROABS", "HGB", "HCT", "MCV", "PLT" in the last 168 hours.    Urinalysis: No results for input(s): "COLORURINE", "LABSPEC", "PHURINE", "GLUCOSEU", "HGBUR", "BILIRUBINUR", "KETONESUR", "PROTEINUR", "UROBILINOGEN", "NITRITE", "LEUKOCYTESUR" in the last 72 hours.  Invalid input(s): "APPERANCEUR"    Imaging: No results found.   Medications:    lactated ringers     promethazine  (PHENERGAN) injection (IM or IVPB) Stopped (05/10/22 0456)    budesonide  0.5 mg Nebulization BID   Chlorhexidine Gluconate Cloth  6 each Topical Q0600   feeding supplement  237 mL Oral TID BM   ipratropium-albuterol  3 mL Nebulization BID   metolazone  2.5 mg Oral Once per day on Mon Thu   pantoprazole (PROTONIX) IV  40 mg Intravenous Q12H   rOPINIRole  1 mg Oral TID    Assessment/ Plan:     Principal Problem:   SBO (small bowel obstruction) (HCC) Active Problems:   Morbid obesity with BMI of 50.0-59.9, adult (HCC)   OSA    Essential hypertension   Obstructive lung disease (generalized) (HCC)   Chronic diastolic CHF (congestive heart failure) (HCC)   Chronic respiratory failure with hypoxia and hypercapnia (HCC)   CKD (chronic kidney disease) stage 4, GFR 15-29 ml/min (HCC)   AMS (altered mental status)  Samantha Mason is a 71 year old female with past medical history including COPD with renal failure requiring 2 L oxygen as needed, chronic diastolic heart failure, sleep apnea, morbid obesity, hypertension, and chronic kidney disease stage IV. Patient presents to the emergency department complaining of abdominal pain and has been admitted for Small bowel obstruction ([place]) [K56.609] SBO (small bowel obstruction) ([place]) [K56.609]   Patient is known to our practice and is followed by  Dr Holley Raring outpatient. She was last seen in office on 04/17/22.  Outpatient baseline creatinine of 1.55/GFR 36 from 04/17/2022.   #1: Acute kidney injury on chronic kidney disease stage 3b:  Patient has recurrent episode of Acute kidney injury.  Unclear reason but may be related to diuretic. Hold torsemide for now.  Creatinine fluctuating but close to baseline. Follow-up as outpatient.   #2: Lower extremity edema Managed with low-salt diet and appropriate fluid restriction. Holding diuretic for now until oral intake improves. Can be restarted as outpatient as needed.   #3: Chronic shortness of  breath/chronic respiratory failure: Continue CPAP at night with 2L [place] during the day.   #4: Small bowel obstruction: Patient was treated with TPN until recently.   Now tolerating clears.     LOS: [facility] 12/23/202312:05 PM

## 2022-05-11 NOTE — TOC Progression Note (Addendum)
Transition of Care [facility]) - Progression Note    Patient Details  Name: DESHUNDRA WALLER MRN: [id number] Date of Birth: Nov 05, 1950  Transition of Care [facility]) CM/SW [place], LCSW Phone Number: 05/11/2022, 10:26 AM  Clinical Narrative:    Metta Clines- appeal is still in clinical review.  Appeal # [id number]   2:10- Appeal still pending.      Barriers to Discharge: Barriers Resolved  Expected Discharge Plan and Services         Expected Discharge Date: 05/11/22                         HH Arranged: RN [place] Agency: [place] ([place]) Date [place]: 05/10/22   Representative spoke with at [place]: [place] ([place]) Interventions [place]: No Food Insecurity (04/27/2022)  Housing: Low Risk  (04/27/2022)  Transportation Needs: No Transportation Needs (04/27/2022)  Utilities: Not At Risk (04/27/2022)  Alcohol Screen: Low Risk  (08/08/2021)  Depression (PHQ2-9): Medium Risk (02/25/2022)  Financial Resource Strain: Medium Risk (08/14/2021)  Physical Activity: Inactive (08/08/2021)  Social Connections: Socially Isolated (08/08/2021)  Stress: No Stress Concern Present (08/08/2021)  Tobacco Use: Low Risk  (05/08/2022)    Readmission Risk Interventions     No data to display

## 2022-05-11 NOTE — Progress Notes (Signed)
    Durable Medical Equipment  (From admission, onward)           Start     Ordered   05/11/22 1503  For home use only DME Hospital bed  Once       Question Answer Comment  Length of Need 6 Months   Bed type Semi-electric      05/11/22 1503

## 2022-05-11 NOTE — Progress Notes (Addendum)
Patient did not tolerate soft diet. Could not discharge today.  She had multiple loose stools today and a lot of gas. repeat BMP KUB tomorrow. Also add reglan.

## 2022-05-11 NOTE — Discharge Summary (Addendum)
Physician Discharge Summary   Patient: Samantha Mason MRN: [id number] DOB: 1950-06-19  Admit date:     04/26/2022  Discharge date: 05/11/22  Discharge Physician: Sharen Hones   PCP: Glean Hess, MD   Recommendations at discharge:   Follow-up with PCP in 1 week. Follow-up with nephrology in 1 week. Repeat a BMP tomorrow.  Discharge Diagnoses: Principal Problem:   SBO (small bowel obstruction) (HCC) Active Problems:   AMS (altered mental status)   CKD (chronic kidney disease) stage 4, GFR 15-29 ml/min (HCC)   Chronic diastolic CHF (congestive heart failure) (HCC)   Essential hypertension   Chronic respiratory failure with hypoxia and hypercapnia (HCC)   Obstructive lung disease (generalized) ([place])   Morbid obesity with BMI of 50.0-59.9, adult (HCC)   OSA  Morbid obesity with BMI 61.15. Acute kidney injury secondary to ATN on chronic kidney disease stage IV. Resolved Problems:   * No resolved hospital problems. [facility] Course: Taken from prior note.  Samantha Mason is a 71 y.o. female with medical history significant for diastolic CHF, and COPD with chronic respiratory failure on 2 L nasal cannula as well as obstructive sleep apnea on CPAP, stage IIIb chronic kidney disease, hypertension and morbid obesity who presents to the ED with a 3-day history of crampy upper abdominal pain and a 1 day history of nonbloody nonbilious vomiting.  She denies fever or chills, diarrhea or dysuria.  Denies chest pain or shortness of breath. ED course and data review: BP 160/83 with pulse of 103 and otherwise normal vitals.  Labs with WBC 15,200.  Sodium 130, creatinine2.29, up from baseline of 1.96.  Lipase and LFTs within normal limits.  CT abdomen and pelvis showing SBO as described below: IMPRESSION: 1. Small-bowel obstruction with transition zone in the ileum. Specific etiology is not identified. 2. Aortic atherosclerosis.  Patient was admitted for SBO, general surgery was  consulted and they advised conservative management.  Patient continued to refuse NG tube placement.  Worsening renal function. Nephrology was also consulted  12/12: Overnight patient became febrile with altered mental status.  Sepsis workup and protocol initiated by overnight provider.  No obvious source of infection so far.  CT head was negative for any acute intracranial abnormality.  Labs with worsening leukocytosis, creatinine and procalcitonin at 1.99.  She received fluid boluses and started on broad-spectrum antibiotics which include cefepime, Flagyl and vancomycin. Discontinuing vancomycin due to worsening renal function and no current diagnosis of MRSA Switching cefepime and Flagyl with Zosyn. Blood cultures still pending. CT abdomen and pelvis ordered-still pending. Worsening renal function which continued to get worse despite getting some IV fluid. Might need starting of dialysis, concern of ATN.  Nephrology is on board.  12/13: Mild tachycardia and low 100s, leukocytosis resolved, hemoglobin stable, renal function with a slight improvement of creatinine to 4.96 and GFR of 9 from 8.  Patient started getting BM and passing flatus, nausea and vomiting improved today. CT abdomen with concern of worsening SBO with transition point in the anterior mid abdomen, likely due to adhesion.  No other significant abnormality-awaiting surgery team response while restarting clear liquid diet.  12/14: Hemodynamically stable, renal functions continue to improve with creatinine of 3.8 today and GFR improved to 12.  Diet was advanced to full liquid.  Hypomagnesemia resolved.  Surgery would like to monitor for another day.  12/15: Patient again developed nausea and vomiting so she was made n.p.o. by surgery.  Repeat abdominal x-ray with persistent small  bowel dilation consistent with obstruction.  Having bowel movements and passing flatus.  Surgery advised n.p.o. and TPN.  Patient currently refusing PICC line  placement and TPN and would like to continue trying p.o. she was also refusing surgery saying that she does not want to take any risk. Difficult patient as she is refusing most of the medical care advised. Discussed with her son and he will try discussing with her as it is just prolonging her length of stay without any meaningful intervention or help. Renal functions continue to improve, creatinine at 3.18 with GFR improved to 15 today.  12/16: Hemodynamically stable.  Renal functions continue to improve with creatinine at 2.85, and GFR of 17.  Electrolytes within normal limit.  Agreed to proceed with PICC line for TPN.  She is having bowel movement but continued to have nausea.  She would like to wait and still not ready for any surgery.  12/17: Hemodynamically stable, renal function continued to improve and appears to be at baseline now with creatinine of 2.31.  Nephrology is recommending continuation of LR at this time.  She was started on TPN.  Continue to have bowel movements and passing flatus, nausea and vomiting improving.  Surgery will continue keeping her n.p.o. for bowel rest.  12/18: Patient clinically having bowel movements and feeling some improvement, case to be with persistently dilated bowel, she is still hesitant to proceed with surgery and would like to try a full liquid diet 1 more time before deciding.  Surgery advance her diet to full liquid. Will continue with TPN  12/19: Hemodynamically stable, mildly elevated blood pressure at 159/74.  Renal function continued to improve with creatinine at 1.69.  Mild hypophosphatemia which will be managed with TPN.  Currently tolerating full liquid diet.  12/20: Remained hemodynamically stable, having bowel movement with minimal abdominal pain and vomiting.  Surgery was scheduled for later today and she refused again. Surgery signed off .  Later also got report from anesthesia during preoperative evaluation for this surgery that she will be a  very high risk for perioperative complications due to multiple comorbidities and morbid obesity and they were recommending going to a tertiary care center for any future surgical procedures.  Had a long discussion with patient that she need to go to a tertiary care center if her symptoms persist.  She will most likely need a surgical procedure to help with her symptoms as she has persistent due to persistent SBO which is most likely secondary to adhesions.  Patient seems understanding.  [facility] care was also consulted.  In the late afternoon received another message by nursing staff that patient now wants to proceed with surgery tomorrow.  Surgery has already signed off and will not be able to proceed with her surgical request due to no slot available in OR and her being very high risk for anesthesia.  We will try stopping TPN and if she continues to tolerate at least clear liquids she can be discharged home tomorrow so she can follow-up with a tertiary care center later at her convenience.  12/21: TPN was discontinued and she was started on full liquid which she was having difficulty to tolerate and started nausea and vomiting along with abdominal pain.  Surgery tried transfer to [place] but unfortunately nobody is willing to accept her and there is also no bed availability.  Surgery signed off as they will not be able to help anymore.  Anesthesia is not comfortable to put  her under for any procedure.  We had a lengthy discussion with patient and she has to go to [facility] herself if her conditions worsens. We will keep her on clear liquid and see how she does overnight before discharge.  12/22: Patient with some improvement in nausea and vomiting this morning, still mildly nauseated and able to tolerate Jell-O and some clear liquids.  Continue to have watery bowel movements and passing flatus.  Per patient it is more gas than BM. Slight worsening of renal function, holding torsemide today and  we will resume at a lower dose from tomorrow.  Continue to have lower extremity edema.  Had another discussion with patient and she would like to be discharged so she can seek medical attention at the hospital of her choice.  Unfortunately we have tried but will not be able to help based on her comorbidities and BMI.  We tried transfer but remained unsuccessful.  She has been given Zofran, Phenergan and pain medications and she will follow-up at [facility] for further assistance.  Patient need bariatric surgery department to help her.  She will continue on rest of home medications and will follow-up with her providers.   Assessment and Plan: * SBO (small bowel obstruction) (HCC) CT showing small bowel obstruction with transition zone in the ileum Patient repeatedly refused NG tube placement ordered by ED after an initial unsuccessful attempt General surgery was consulted and they were recommending conservative management initially and then advising surgery as she did not improve after many days and repeat imaging with persistent bowel obstruction. Repeat CT abdomen with worsening of SBO and transition point, concern of adhesion.  Patient has been tolerating full liquid diet, she had multiple loose stools every day, she feels much improved.  Advance diet to soft. Patient is stable to be discharged, general surgery has signed off.  AMS (altered mental status) Resolved Patient transiently became altered and febrile overnight.  Worsening leukocytosis, has been resolved Chest x-ray negative for pneumonia or pulmonary edema. CT head negative for any acute intracranial abnormality. Procalcitonin elevated at 1.11.  UA with microscopic hematuria. Urine cultures with Enterococcus faecalis and blood cultures negative so far.  CT abdomen with worsening of SBO and no other acute abnormality.  She was given IV fluid and started on broad-spectrum antibiotics. -Continue Zosyn--completed total of 7 days  course  CKD (chronic kidney disease) stage 4, GFR 15-29 ml/min (HCC) AKI with CKD stage IV. Renal function continued to improve with creatinine currently at 1.69 with baseline around 2.29. Nephrology was also consulted.  No contrast exposure.  Concern of ATN. Renal function was initially much better on fluids, now back to baseline, creatinine has been between 1.9-2.0.  Discussed with Dr. Candiss Norse, no additional treatment is needed.  Patient can be discharged home from his standpoint, patient be followed up with Dr. Holley Raring in 1 week.  I will continue hold torsemide and potassium.  Follow-up with a BMP at the next office visit.  Chronic diastolic CHF (congestive heart failure) (HCC) No volume overload.  Essential hypertension Blood pressure within goal   Chronic respiratory failure with hypoxia and hypercapnia (HCC) O2 requirement at baseline  Obstructive lung disease (generalized) ([place]) Not acutely exacerbated Continue Pulmicort twice daily with DuoNebs as needed  Morbid obesity with BMI of 50.0-59.9, adult (HCC) Estimated body mass index is 61.31 kg/m as calculated from the following:   Height as of this encounter: '5\' 1"'$  (1.549 m).   Weight as of this encounter: 147.2 kg.   -  Complicating factor to overall prognosis and care  OSA  CPAP nightly        Consultants: Nephrology Procedures performed: None  Disposition: Home health Diet recommendation:  Discharge Diet Orders (From admission, onward)     Start     Ordered   05/11/22 0000  Diet - low sodium heart healthy        05/11/22 1000           Cardiac diet DISCHARGE MEDICATION: Allergies as of 05/11/2022       Reactions   Nuvigil [armodafinil] Hives   Penicillins Diarrhea, Nausea And Vomiting   Did it involve swelling of the face/tongue/throat, SOB, or low BP? no Did it involve sudden or severe rash/hives, skin peeling, or any reaction on the inside of your mouth or nose? No Did you need to seek medical  attention at a hospital or doctor's office? No When did it last happen?  in her 31s    If all above answers are "NO", may proceed with cephalosporin use.   Spironolactone Itching   Gabapentin Other (See Comments)   "wiped her out, couldn't stay awake"   Melatonin Other (See Comments)   "Brain fog"   Provigil [modafinil] Hives   Entresto [sacubitril-valsartan] Itching   Lisinopril Cough        Medication List     STOP taking these medications    Potassium Chloride ER 20 MEQ Tbcr   torsemide 20 MG tablet Commonly known as: DEMADEX       TAKE these medications    acetaminophen 500 MG tablet Commonly known as: TYLENOL Take 1,000 mg by mouth every 6 (six) hours as needed.   benzonatate 100 MG capsule Commonly known as: TESSALON TAKE 1 CAPSULE BY MOUTH THREE TIMES A DAY AS NEEDED FOR COUGH   budesonide 0.5 MG/2ML nebulizer solution Commonly known as: PULMICORT TAKE 2 ML (0.5 MG TOTAL) BY NEBULIZATION TWICE A DAY   calcitRIOL 0.25 MCG capsule Commonly known as: ROCALTROL Take 0.25 mcg by mouth daily.   dapagliflozin propanediol 10 MG Tabs tablet Commonly known as: Farxiga Take 1 tablet (10 mg total) by mouth daily before breakfast.   diltiazem 240 MG 24 hr capsule Commonly known as: CARDIZEM CD Take 1 capsule (240 mg total) by mouth daily.   feeding supplement Liqd Take 237 mLs by mouth 3 (three) times daily between meals.   ipratropium-albuterol 0.5-2.5 (3) MG/3ML Soln Commonly known as: DUONEB TAKE 3 MLS BY NEBULIZATION EVERY 6 (SIX) HOURS AS NEEDED. J45.40   metolazone 2.5 MG tablet Commonly known as: ZAROXOLYN Take 1 tablet (2.5 mg total) by mouth 2 (two) times a week.   NON FORMULARY Bipap - nightly with Oxygen 2 Lpm   ondansetron 4 MG tablet Commonly known as: Zofran Take 1 tablet (4 mg total) by mouth daily as needed for nausea or vomiting.   oxyCODONE HCl 7.5 MG Tabs Take 7.5 mg by mouth every 6 (six) hours as needed.   OXYGEN Inhale into  the lungs. 2 LPM [place]   pantoprazole 40 MG tablet Commonly known as: PROTONIX TAKE 1 TABLET BY MOUTH EVERY DAY   Prevalite 4 g packet Generic drug: cholestyramine light DISSOLVE AND DRINK 1 PACKET BY MOUTH AT BEDTIME   promethazine 25 MG tablet Commonly known as: PHENERGAN Take 1 tablet (25 mg total) by mouth every 6 (six) hours as needed. What changed: how much to take   rOPINIRole 1 MG tablet Commonly known as: REQUIP Take 1 tablet (1 mg  total) by mouth 3 (three) times daily.        Follow-up Information     Glean Hess, MD. Go on 05/17/2022.   Specialty: Internal Medicine Why: Appointment @ 2:00 pm on 05/17/22. Arrive at 1:45 pm. Contact information: [address] [place] [address] [phone]         Anthonette Legato, MD Follow up in 1 week(s).   Specialty: Nephrology Contact information: [place] [address] [phone]                Discharge Exam: Filed Weights   05/10/22 0411 05/10/22 0852 05/11/22 0500  Weight: (!) 149.9 kg (!) 147.5 kg (!) 146.8 kg   General exam: Appears calm and comfortable, morbidly obese. Respiratory system: Clear to auscultation. Respiratory effort normal. Cardiovascular system: S1 & S2 heard, RRR. No JVD, murmurs, rubs, gallops or clicks. 2+ chronic leg edema. Gastrointestinal system: Abdomen is nondistended, soft and nontender. No organomegaly or masses felt. Normal bowel sounds heard. Central nervous system: Alert and oriented. No focal neurological deficits. Extremities: Symmetric 5 x 5 power. Skin: No rashes, lesions or ulcers Psychiatry: Judgement and insight appear normal. Mood & affect appropriate.    Condition at discharge: good  The results of significant diagnostics from this hospitalization (including imaging, microbiology, ancillary and laboratory) are listed below for reference.   Imaging Studies: DG Abd 1 View  Result Date: 05/06/2022 CLINICAL DATA:   Small bowel obstruction.[id number]. EXAM: ABDOMEN - 1 VIEW COMPARISON:  05/03/2022 FINDINGS: There is persistent dilatation of small bowel loops in the central abdomen, similar in appearance to prior study. Relative normal appearance of the large bowel loops. Surgical clips are noted in the RIGHT UPPER QUADRANT of the abdomen. No evidence for free intraperitoneal air. IMPRESSION: Persistent dilatation of small bowel loops in the central abdomen compatible with small-bowel obstruction. Electronically Signed   By: Nolon Nations M.D.   On: 05/06/2022 09:00   [place] EKG SITE RITE  Result Date: 05/03/2022 If Site Rite image not attached, placement could not be confirmed due to current cardiac rhythm.  DG Abd 1 View  Result Date: 05/03/2022 CLINICAL DATA:  Abdominal pain EXAM: ABDOMEN - 1 VIEW COMPARISON:  Radiograph 05/01/2022, CT 05/01/2022 FINDINGS: Persistent small bowel dilation with decompressed colon, slightly improved from prior. Some residual contrast material is noted in the distal colon. IMPRESSION: Persistent small bowel dilation consistent with obstruction, slightly improved from prior. Electronically Signed   By: Maurine Simmering M.D.   On: 05/03/2022 08:39   DG Abd 1 View  Result Date: 05/01/2022 CLINICAL DATA:  Ileus, generalized abdominal pain. EXAM: ABDOMEN - 1 VIEW COMPARISON:  CT abdomen pelvis 05/01/2022 and abdominal radiographs 04/29/2022. FINDINGS: Persistent small bowel dilatation. Oral contrast is seen in nondilated colon, as on 05/01/2022. IMPRESSION: Small bowel obstruction. Electronically Signed   By: Lorin Picket M.D.   On: 05/01/2022 09:08   CT ABDOMEN PELVIS WO CONTRAST  Result Date: 05/01/2022 CLINICAL DATA:  Abdominal pain.  Small-bowel obstruction.  Sepsis. EXAM: CT ABDOMEN AND PELVIS WITHOUT CONTRAST TECHNIQUE: Multidetector CT imaging of the abdomen and pelvis was performed following the standard protocol without IV contrast. RADIATION DOSE REDUCTION: This exam was  performed according to the departmental dose-optimization program which includes automated exposure control, adjustment of the mA and/or kV according to patient size and/or use of iterative reconstruction technique. COMPARISON:  04/26/2022 FINDINGS: Lower chest: No acute findings. Hepatobiliary: No mass visualized on this unenhanced exam. Prior  cholecystectomy. No evidence of biliary obstruction. Pancreas: No mass or inflammatory process visualized on this unenhanced exam. Spleen:  Within normal limits in size. Adrenals/Urinary tract: No evidence of urolithiasis or hydronephrosis. Unremarkable unopacified urinary bladder. Stomach/Bowel: Moderately dilated fluid-filled small bowel loops have increased since previous study. Transition point is seen in the anterior mid abdomen, likely due to adhesion. No mass or inflammatory process identified. No abnormal fluid collections seen. Vascular/Lymphatic: No pathologically enlarged lymph nodes identified. No evidence of abdominal aortic aneurysm. Reproductive: Stable 2 cm fat attenuation mass in the uterus, consistent with a benign lipoleiomyoma. Adnexal regions are unremarkable. Other:  None. Musculoskeletal: No suspicious bone lesions identified. Incidental note is again made of Paget's disease involving the left hip. IMPRESSION: Worsening small bowel obstruction with transition point in the anterior mid abdomen, likely due to adhesion. No evidence of inflammatory process or abscess. Stable small uterine lipoleiomyoma. Electronically Signed   By: Marlaine Hind M.D.   On: 05/01/2022 09:03   DG Chest Port 1 View  Result Date: 04/30/2022 CLINICAL DATA:  Sepsis EXAM: PORTABLE CHEST 1 VIEW COMPARISON:  01/23/2022 FINDINGS: Streaky density and right diaphragm elevation that is chronic based on priors. No edema or acute opacity. No effusion or pneumothorax. Normal heart size and mediastinal contours. IMPRESSION: Chronic elevation of the right diaphragm with overlying lower  lobe scarring. No edema or convincing pneumonia when compared to priors. Electronically Signed   By: Jorje Guild M.D.   On: 04/30/2022 04:34   CT HEAD WO CONTRAST (5MM)  Result Date: 04/30/2022 CLINICAL DATA:  Altered mental status EXAM: CT HEAD WITHOUT CONTRAST TECHNIQUE: Contiguous axial images were obtained from the base of the skull through the vertex without intravenous contrast. RADIATION DOSE REDUCTION: This exam was performed according to the departmental dose-optimization program which includes automated exposure control, adjustment of the mA and/or kV according to patient size and/or use of iterative reconstruction technique. COMPARISON:  01/11/2019 FINDINGS: Brain: There is no mass, hemorrhage or extra-axial collection. The size and configuration of the ventricles and extra-axial CSF spaces are normal. There is hypoattenuation of the white matter, most commonly indicating chronic small vessel disease. Vascular: No abnormal hyperdensity of the major intracranial arteries or dural venous sinuses. No intracranial atherosclerosis. Skull: The visualized skull base, calvarium and extracranial soft tissues are normal. Sinuses/Orbits: No fluid levels or advanced mucosal thickening of the visualized paranasal sinuses. No mastoid or middle ear effusion. The orbits are normal. IMPRESSION: 1. No acute intracranial abnormality. 2. Chronic small vessel disease. Electronically Signed   By: Ulyses Jarred M.D.   On: 04/30/2022 01:48   DG Abd 1 View  Result Date: 04/29/2022 CLINICAL DATA:  Small bowel obstruction. EXAM: ABDOMEN - 1 VIEW COMPARISON:  April 27, 2022. FINDINGS: Continued small bowel dilatation is noted which may be slightly decreased compared to prior exam. Residual contrast is seen throughout the nondilated colon. IMPRESSION: Continued small bowel dilatation is noted which may be slightly decreased compared to prior exam, although residual contrast is noted throughout nondilated colon.  Electronically Signed   By: Marijo Conception M.D.   On: 04/29/2022 08:11   US RENAL  Result Date: 04/28/2022 CLINICAL DATA:  Acute kidney injury. EXAM: RENAL / URINARY TRACT ULTRASOUND COMPLETE COMPARISON:  CT, 04/26/2022. FINDINGS: Right Kidney: Renal measurements: 9.2 x 4.6 x 4 cm = volume: 88 mL. Diffuse cortical thinning. Mild increased parenchymal echogenicity. No mass, stone or hydronephrosis. Left Kidney: Renal measurements: 10.4 x 4.6 x 3.3 cm = volume: 82 mL.  Diffuse cortical thinning. Mild increased parenchymal echogenicity. No mass, stone or hydronephrosis. Bladder: Not visualized. Other: None. IMPRESSION: 1. Limited study due to the patient's body habitus. 2. Findings consistent with medical renal disease with bilateral renal cortical thinning and increased renal parenchymal echogenicity. No renal mass. No hydronephrosis. Electronically Signed   By: Lajean Manes M.D.   On: 04/28/2022 09:36   DG Abd Portable 1V-Small Bowel Obstruction Protocol-initial, 8 hr delay  Result Date: 04/27/2022 CLINICAL DATA:  Small-bowel obstruction, 8 hour follow-up film EXAM: PORTABLE ABDOMEN - 1 VIEW COMPARISON:  CT from the previous day. FINDINGS: Scattered large and small bowel gas is noted. Persistent small bowel dilatation is noted proximally. Administered contrast lies predominately within the stomach although some contrast is noted within the small bowel. This extends distally into the ileum beyond the transition zone seen on prior CT examination. Minimal contrast is noted within the right colon. No free air is seen. No other focal abnormality is noted. IMPRESSION: Administered contrast has reached the right colon although persistent small bowel dilatation remains. Electronically Signed   By: Inez Catalina M.D.   On: 04/27/2022 20:00   CT ABDOMEN PELVIS WO CONTRAST  Result Date: 04/26/2022 CLINICAL DATA:  Abdominal pain, acute, nonlocalized. EXAM: CT ABDOMEN AND PELVIS WITHOUT CONTRAST TECHNIQUE:  Multidetector CT imaging of the abdomen and pelvis was performed following the standard protocol without IV contrast. RADIATION DOSE REDUCTION: This exam was performed according to the departmental dose-optimization program which includes automated exposure control, adjustment of the mA and/or kV according to patient size and/or use of iterative reconstruction technique. COMPARISON:  Ultrasound 07/24/2021.  CT 08/30/2010 FINDINGS: Lower chest: Mild atelectasis or scarring at the right lung base. Hepatobiliary: Liver parenchyma appears normal without contrast. Previous cholecystectomy. No sign of ductal dilatation given that. Pancreas: Normal Spleen: Normal Adrenals/Urinary Tract: Adrenal glands are normal. Kidneys are normal. Bladder is normal. Stomach/Bowel: Stomach is fluid-filled. Numerous dilated fluid and air-filled loops of small intestine with transition zone in the ileum consistent with small bowel obstruction. Specific etiology is not identified. No abnormal colon finding. Vascular/Lymphatic: Aortic atherosclerosis, mild. No aneurysm. IVC is normal. No adenopathy. Reproductive: No adnexal mass.  Small uterine lipoma. Other: No free fluid or air. Musculoskeletal: Ordinary mild spinal degenerative changes. IMPRESSION: 1. Small-bowel obstruction with transition zone in the ileum. Specific etiology is not identified. 2. Aortic atherosclerosis. Aortic Atherosclerosis (ICD10-I70.0). Electronically Signed   By: Nelson Chimes M.D.   On: 04/26/2022 18:32    Microbiology: Results for orders placed or performed during the hospital encounter of 04/26/22  Culture, blood (Routine X 2) w Reflex to ID Panel     Status: None   Collection Time: 04/30/22  4:18 AM   Specimen: BLOOD LEFT ARM  Result Value Ref Range Status   Specimen Description BLOOD LEFT ARM  Final   Special Requests   Final    BOTTLES DRAWN AEROBIC AND ANAEROBIC Blood Culture adequate volume   Culture   Final    NO GROWTH 5 DAYS Performed at  [facility], [address] [place], [place] [address]    Report Status 05/05/2022 FINAL  Final  Culture, blood (Routine X 2) w Reflex to ID Panel     Status: None   Collection Time: 04/30/22  5:04 AM   Specimen: BLOOD LEFT ARM  Result Value Ref Range Status   Specimen Description BLOOD LEFT ARM  Final   Special Requests   Final    BOTTLES DRAWN AEROBIC AND ANAEROBIC  Blood Culture adequate volume   Culture   Final    NO GROWTH 5 DAYS Performed at [facility], [address] [place], [place] [address]    Report Status 05/05/2022 FINAL  Final  Urine Culture     Status: Abnormal   Collection Time: 04/30/22 10:02 AM   Specimen: Urine, Clean Catch  Result Value Ref Range Status   Specimen Description   Final    URINE, CLEAN CATCH Performed at [facility], [address], [place], [place] [address]    Special Requests   Final    NONE Performed at [facility], [place],  [address]    Culture 40,000 COLONIES/mL ENTEROCOCCUS FAECIUM (A)  Final   Report Status 05/02/2022 FINAL  Final   Organism ID, Bacteria ENTEROCOCCUS FAECIUM (A)  Final      Susceptibility   Enterococcus faecium - MIC*    AMPICILLIN <=2 SENSITIVE Sensitive     NITROFURANTOIN 64 INTERMEDIATE Intermediate     VANCOMYCIN <=0.5 SENSITIVE Sensitive     * 40,000 COLONIES/mL ENTEROCOCCUS FAECIUM    Labs: CBC: No results for input(s): "WBC", "NEUTROABS", "HGB", "HCT", "MCV", "PLT" in the last 168 hours. Basic Metabolic Panel: Recent Labs  Lab 05/07/22 0643 05/08/22 0708 05/09/22 0621 05/10/22 0551 05/11/22 0443  NA 140 140 139 135 134*  K 4.0 4.7 4.4 4.3 4.1  CL 106 108 104 101 99  CO2 '28 26 25 27 28  '$ GLUCOSE 127* 126* 106* 100* 107*  BUN 57* 53* 52* 52* 48*  CREATININE 1.69* 1.35* 1.56* 1.90* 2.00*  CALCIUM 9.2 8.6* 8.7* 8.7* 8.5*  MG 1.8 2.3 1.9 1.7 2.0  PHOS 2.3* 4.1 3.8 3.4 3.4   Liver Function Tests: Recent Labs  Lab 05/06/22 0325  05/09/22 0621  AST 18 12*  ALT 15 11  ALKPHOS 59 58  BILITOT 0.3 0.8  PROT 5.8* 6.4*  ALBUMIN 2.5* 2.8*   CBG: Recent Labs  Lab 05/07/22 2355 05/08/22 0512 05/08/22 0747 05/08/22 1133 05/08/22 1628  GLUCAP 128* 101* 118* 132* 114*    Discharge time spent: greater than 30 minutes.  Signed: Sharen Hones, MD [facility] 05/11/2022

## 2022-05-11 NOTE — TOC Transition Note (Addendum)
Transition of Care [facility]) - CM/SW Discharge Note   Patient Details  Name: Samantha Mason MRN: [id number] Date of Birth: Jan 03, 1951  [facility]) CM/SW Contact:  Magnus Ivan, LCSW Phone Number: 05/11/2022, 2:52 PM   Clinical Narrative:    Received fax from Judithann Graves that [facility] agrees with the discharge plan.  Spoke with patient who stated she already spoken with Judithann Graves and is aware of her right for a reconsideration but plans to go home today.  Patient requests to speak with MD.  Patient states her son will help her at home and she is agreeable with plan to resume home health through [facility]. CSW updated Corene Cornea with [facility] of [facility] today. Patient requests a hospital bed, she is aware it may not be delivered this weekend and would like to talk with [employer] about copays - referral made to [facility] with [facility] who states [facility] will follow up with patient about copays and scheduling delivery. Patient denies additional TOC needs. MD and RN updated.   3:20- Per MD patient not medically ready for DC today. Updated Corene Cornea with [facility] and Mardene Celeste with [facility].     Final next level of care: Home w Home Health Services Barriers to Discharge: Barriers Resolved   Patient Goals and CMS Choice CMS Medicare.gov Compare Post Acute Care list provided to:: Patient Choice offered to / list presented to : Patient  Discharge Placement                  Patient to be transferred to facility by: son Name of family member notified: patient notified Patient and family notified of of transfer: 05/11/22  Discharge Plan and Services Additional resources added to the After Visit Summary for                  DME Arranged: Hospital bed DME Agency: [facility] Date DME Agency Contacted: 05/11/22   Representative spoke with at DME Agency: [place]: RN [place] Agency: [place] ([place]) Date [place]: 05/11/22   Representative spoke with at [place]:  [place] ([place]) Interventions [place]: No Food Insecurity (04/27/2022)  Housing: Low Risk  (04/27/2022)  Transportation Needs: No Transportation Needs (04/27/2022)  Utilities: Not At Risk (04/27/2022)  Alcohol Screen: Low Risk  (08/08/2021)  Depression (PHQ2-9): Medium Risk (02/25/2022)  Financial Resource Strain: Medium Risk (08/14/2021)  Physical Activity: Inactive (08/08/2021)  Social Connections: Socially Isolated (08/08/2021)  Stress: No Stress Concern Present (08/08/2021)  Tobacco Use: Low Risk  (05/08/2022)     Readmission Risk Interventions     No data to display

## 2022-05-12 ENCOUNTER — Inpatient Hospital Stay: Payer: Medicare Other

## 2022-05-12 DIAGNOSIS — I5032 Chronic diastolic (congestive) heart failure: Secondary | ICD-10-CM | POA: Diagnosis not present

## 2022-05-12 DIAGNOSIS — N184 Chronic kidney disease, stage 4 (severe): Secondary | ICD-10-CM | POA: Diagnosis not present

## 2022-05-12 DIAGNOSIS — K56609 Unspecified intestinal obstruction, unspecified as to partial versus complete obstruction: Secondary | ICD-10-CM | POA: Diagnosis not present

## 2022-05-12 LAB — BASIC METABOLIC PANEL
Anion gap: 8 (ref 5–15)
BUN: 44 mg/dL — ABNORMAL HIGH (ref 8–23)
CO2: 26 mmol/L (ref 22–32)
Calcium: 8.6 mg/dL — ABNORMAL LOW (ref 8.9–10.3)
Chloride: 100 mmol/L (ref 98–111)
Creatinine, Ser: 2.01 mg/dL — ABNORMAL HIGH (ref 0.44–1.00)
GFR, Estimated: 26 mL/min — ABNORMAL LOW (ref >60)
Glucose, Bld: 109 mg/dL — ABNORMAL HIGH (ref 70–99)
Potassium: 3.9 mmol/L (ref 3.5–5.1)
Sodium: 134 mmol/L — ABNORMAL LOW (ref 135–145)

## 2022-05-12 LAB — CBC
HCT: 31.5 % — ABNORMAL LOW (ref 36.0–46.0)
Hemoglobin: 9.7 g/dL — ABNORMAL LOW (ref 12.0–15.0)
MCH: 24.6 pg — ABNORMAL LOW (ref 26.0–34.0)
MCHC: 30.8 g/dL (ref 30.0–36.0)
MCV: 79.9 fL — ABNORMAL LOW (ref 80.0–100.0)
Platelets: 292 10*3/uL (ref 150–400)
RBC: 3.94 MIL/uL (ref 3.87–5.11)
RDW: 17.4 % — ABNORMAL HIGH (ref 11.5–15.5)
WBC: 10.8 10*3/uL — ABNORMAL HIGH (ref 4.0–10.5)
nRBC: 0 % (ref 0.0–0.2)

## 2022-05-12 MED ORDER — METOCLOPRAMIDE HCL 5 MG PO TABS: 5.0000 mg | ORAL_TABLET | Freq: Three times a day (TID) | ORAL | 0 refills | Status: DC

## 2022-05-12 NOTE — TOC Transition Note (Addendum)
Transition of Care [facility]) - CM/SW Discharge Note   Patient Details  Name: Samantha Mason MRN: [id number] Date of Birth: [birth year]  [facility] of [facility]) CM/SW Contact:  Magnus Ivan, LCSW Phone Number: 05/12/2022, 12:15 PM   Clinical Narrative:    Clabe Seal with [facility] that patient will transfer to [facility].  Final next level of care: Home w Home Health Services Barriers to Discharge: Barriers Resolved   Patient Goals and CMS Choice CMS Medicare.gov Compare Post Acute Care list provided to:: Patient Choice offered to / list presented to : Patient  Discharge Placement                  Patient to be transferred to facility by: son Name of family member notified: patient notified Patient and family notified of of transfer: 05/11/22  Discharge Plan and Services Additional resources added to the After Visit Summary for                  DME Arranged: Hospital bed DME Agency: [facility] Date DME Agency Contacted: 05/11/22   Representative spoke with at DME Agency: [place]: RN [place] Agency: [place] ([place]) Date [place]: 05/11/22   Representative spoke with at [place]: [place] ([place]) Interventions [place]: No Food Insecurity (04/27/2022)  Housing: Low Risk  (04/27/2022)  Transportation Needs: No Transportation Needs (04/27/2022)  Utilities: Not At Risk (04/27/2022)  Alcohol Screen: Low Risk  (08/08/2021)  Depression (PHQ2-9): Medium Risk (02/25/2022)  Financial Resource Strain: Medium Risk (08/14/2021)  Physical Activity: Inactive (08/08/2021)  Social Connections: Socially Isolated (08/08/2021)  Stress: No Stress Concern Present (08/08/2021)  Tobacco Use: Low Risk  (05/08/2022)     Readmission Risk Interventions     No data to display

## 2022-05-12 NOTE — Progress Notes (Signed)
This nurse called [facility] to arrange transportation was told to call [facility] to arrange transportation. Spoke with [facility] was told they would call back once transportation arranged with a ETA.

## 2022-05-12 NOTE — Progress Notes (Signed)
[facility]  PROGRESS NOTE   Subjective:   Patient seen earlier today.  States she is able to tolerate clears and Jell-O. Continues to have residual lower extremity edema.  Denies any shortness of breath.  Remains on room air.   Objective:  Vital signs: Blood pressure (!) 122/54, pulse 98, temperature 98.2 F (36.8 C), temperature source Oral, resp. rate 20, height '5\' 1"'$  (1.549 m), weight (!) 150 kg, SpO2 98 %.  Intake/Output Summary (Last 24 hours) at 05/12/2022 1212 Last data filed at 05/12/2022 1000 Gross per 24 hour  Intake 770 ml  Output --  Net 770 ml    Filed Weights   05/10/22 0852 05/11/22 0500 05/12/22 0427  Weight: (!) 147.5 kg (!) 146.8 kg (!) 150 kg     Physical Exam: General:  No acute distress  Head:  Normocephalic, atraumatic. Moist oral mucosal membranes  Eyes:  Anicteric  Lungs:   Clear to auscultation, normal effort  Heart:  S1S2 no rubs  Abdomen:   Soft, obese  Extremities: 1+ peripheral edema.    Neurologic:  Awake, alert, following commands  Skin:  No lesions       Basic Metabolic Panel: Recent Labs  Lab 05/07/22 0643 05/08/22 0708 05/09/22 0621 05/10/22 0551 05/11/22 0443 05/12/22 0818  NA 140 140 139 135 134* 134*  K 4.0 4.7 4.4 4.3 4.1 3.9  CL 106 108 104 101 99 100  CO2 '28 26 25 27 28 26  '$ GLUCOSE 127* 126* 106* 100* 107* 109*  BUN 57* 53* 52* 52* 48* 44*  CREATININE 1.69* 1.35* 1.56* 1.90* 2.00* 2.01*  CALCIUM 9.2 8.6* 8.7* 8.7* 8.5* 8.6*  MG 1.8 2.3 1.9 1.7 2.0  --   PHOS 2.3* 4.1 3.8 3.4 3.4  --      CBC: Recent Labs  Lab 05/12/22 0818  WBC 10.8*  HGB 9.7*  HCT 31.5*  MCV 79.9*  PLT 292      Urinalysis: No results for input(s): "COLORURINE", "LABSPEC", "PHURINE", "GLUCOSEU", "HGBUR", "BILIRUBINUR", "KETONESUR", "PROTEINUR", "UROBILINOGEN", "NITRITE", "LEUKOCYTESUR" in the last 72 hours.  Invalid input(s): "APPERANCEUR"    Imaging: DG Abd 1 View  Result Date: 05/12/2022 CLINICAL DATA:   Small-bowel obstruction EXAM: ABDOMEN - 1 VIEW COMPARISON:  Radiograph 05/06/2022 FINDINGS: Persistent small bowel dilation, not significantly changed from prior. Normal caliber colon. Right upper quadrant surgical clips. Degenerative changes of the spine. IMPRESSION: Persistent small bowel obstruction. Electronically Signed   By: Maurine Simmering M.D.   On: 05/12/2022 08:15     Medications:    lactated ringers     promethazine (PHENERGAN) injection (IM or IVPB) Stopped (05/11/22 1719)    budesonide  0.5 mg Nebulization BID   Chlorhexidine Gluconate Cloth  6 each Topical Q0600   feeding supplement  237 mL Oral TID BM   ipratropium-albuterol  3 mL Nebulization BID   metoCLOPramide  5 mg Oral TID AC   metolazone  2.5 mg Oral Once per day on Mon Thu   pantoprazole (PROTONIX) IV  40 mg Intravenous Q12H   rOPINIRole  1 mg Oral TID    Assessment/ Plan:     Principal Problem:   Small bowel obstruction (HCC) Active Problems:   Morbid obesity with BMI of 50.0-59.9, adult (HCC)   OSA    Essential hypertension   Obstructive lung disease (generalized) ([place])   Chronic diastolic CHF (congestive heart failure) (HCC)   Chronic respiratory failure with hypoxia and hypercapnia (HCC)   CKD (chronic kidney disease) stage  4, GFR 15-29 ml/min (HCC)   AMS (altered mental status)  Samantha Mason is a 71 year old female with past medical history including COPD with renal failure requiring 2 L oxygen as needed, chronic diastolic heart failure, sleep apnea, morbid obesity, hypertension, and chronic kidney disease stage IV. Patient presents to the emergency department complaining of abdominal pain and has been admitted for Small bowel obstruction ([place]) [K56.609] SBO (small bowel obstruction) ([place]) [K56.609]   Patient is known to our practice and is followed by Dr Holley Raring outpatient. She was last seen in office on 04/17/22.  Outpatient baseline creatinine of 1.55/GFR 36 from 04/17/2022.   #1: Acute kidney injury  on chronic kidney disease stage 3b:  Patient has recurrent episode of Acute kidney injury.  Unclear reason but may be related to diuretic. Hold torsemide for now.  Creatinine fluctuating but close to baseline. Follow-up as outpatient.   #2: Lower extremity edema will possibly lymphedema Managed with low-salt diet and appropriate fluid restriction. Holding diuretic for now until oral intake improves. Can be restarted as outpatient as needed.   #3: Chronic shortness of breath/chronic respiratory failure: Continue CPAP at night with 2L [place]  during the day.   #4: Small bowel obstruction: Patient was treated with TPN until recently.   Now tolerating clears.     LOS: [facility] 12/24/202312:12 PM

## 2022-05-12 NOTE — Progress Notes (Addendum)
Patient became nausea and vomiting again, appears not tolerating soft diet.  Will change to full liquid diet again.  Not able to discharge.  Discussed with [facility], transferred all the radiology images, patient is accepted. Accepting physician is DR. Gallaher from general surgery

## 2022-05-12 NOTE — Discharge Summary (Signed)
Physician Discharge Summary   Patient: Samantha Mason MRN: [id number] DOB: 07/28/1950  Admit date:     04/26/2022  Discharge date: 05/12/22  Discharge Physician: Sharen Hones   PCP: Glean Hess, MD   Recommendations at discharge:   Follow-up with PCP in 1 week. Please refer to a general surgeon in [facility] for abdominal adhesion with obstruction.  Discharge Diagnoses: Principal Problem:   Small bowel obstruction ([place]) Active Problems:   AMS (altered mental status)   CKD (chronic kidney disease) stage 4, GFR 15-29 ml/min (HCC)   Chronic diastolic CHF (congestive heart failure) (HCC)   Essential hypertension   Chronic respiratory failure with hypoxia and hypercapnia (HCC)   Obstructive lung disease (generalized) ([place])   Morbid obesity with BMI of 50.0-59.9, adult (HCC)   OSA   Resolved Problems:   * No resolved hospital problems. [facility] Course: Taken from prior note.  [name] is a 71 y.o. female with medical history significant for diastolic CHF, and COPD with chronic respiratory failure on 2 L nasal cannula as well as obstructive sleep apnea on CPAP, stage IIIb chronic kidney disease, hypertension and morbid obesity who presents to the ED with a 3-day history of crampy upper abdominal pain and a 1 day history of nonbloody nonbilious vomiting.  She denies fever or chills, diarrhea or dysuria.  Denies chest pain or shortness of breath. ED course and data review: BP 160/83 with pulse of 103 and otherwise normal vitals.  Labs with WBC 15,200.  Sodium 130, creatinine2.29, up from baseline of 1.96.  Lipase and LFTs within normal limits.  CT abdomen and pelvis showing SBO as described below: IMPRESSION: 1. Small-bowel obstruction with transition zone in the ileum. Specific etiology is not identified. 2. Aortic atherosclerosis.  Patient was admitted for SBO, general surgery was consulted and they advised conservative management.  Patient continued  to refuse NG tube placement.  Worsening renal function. Nephrology was also consulted  12/12: Overnight patient became febrile with altered mental status.  Sepsis workup and protocol initiated by overnight provider.  No obvious source of infection so far.  CT head was negative for any acute intracranial abnormality.  Labs with worsening leukocytosis, creatinine and procalcitonin at 1.99.  She received fluid boluses and started on broad-spectrum antibiotics which include cefepime, Flagyl and vancomycin. Discontinuing vancomycin due to worsening renal function and no current diagnosis of MRSA Switching cefepime and Flagyl with Zosyn. Blood cultures still pending. CT abdomen and pelvis ordered-still pending. Worsening renal function which continued to get worse despite getting some IV fluid. Might need starting of dialysis, concern of ATN.  Nephrology is on board.  12/13: Mild tachycardia and low 100s, leukocytosis resolved, hemoglobin stable, renal function with a slight improvement of creatinine to 4.96 and GFR of 9 from 8.  Patient started getting BM and passing flatus, nausea and vomiting improved today. CT abdomen with concern of worsening SBO with transition point in the anterior mid abdomen, likely due to adhesion.  No other significant abnormality-awaiting surgery team response while restarting clear liquid diet.  12/14: Hemodynamically stable, renal functions continue to improve with creatinine of 3.8 today and GFR improved to 12.  Diet was advanced to full liquid.  Hypomagnesemia resolved.  Surgery would like to monitor for another day.  12/15: Patient again developed nausea and vomiting so she was made n.p.o. by surgery.  Repeat abdominal x-ray with persistent small bowel dilation consistent with obstruction.  Having bowel movements and passing  flatus.  Surgery advised n.p.o. and TPN.  Patient currently refusing PICC line placement and TPN and would like to continue trying p.o. she was also  refusing surgery saying that she does not want to take any risk. Difficult patient as she is refusing most of the medical care advised. Discussed with her son and he will try discussing with her as it is just prolonging her length of stay without any meaningful intervention or help. Renal functions continue to improve, creatinine at 3.18 with GFR improved to 15 today.  12/16: Hemodynamically stable.  Renal functions continue to improve with creatinine at 2.85, and GFR of 17.  Electrolytes within normal limit.  Agreed to proceed with PICC line for TPN.  She is having bowel movement but continued to have nausea.  She would like to wait and still not ready for any surgery.  12/17: Hemodynamically stable, renal function continued to improve and appears to be at baseline now with creatinine of 2.31.  Nephrology is recommending continuation of LR at this time.  She was started on TPN.  Continue to have bowel movements and passing flatus, nausea and vomiting improving.  Surgery will continue keeping her n.p.o. for bowel rest.  12/18: Patient clinically having bowel movements and feeling some improvement, case to be with persistently dilated bowel, she is still hesitant to proceed with surgery and would like to try a full liquid diet 1 more time before deciding.  Surgery advance her diet to full liquid. Will continue with TPN  12/19: Hemodynamically stable, mildly elevated blood pressure at 159/74.  Renal function continued to improve with creatinine at 1.69.  Mild hypophosphatemia which will be managed with TPN.  Currently tolerating full liquid diet.  12/20: Remained hemodynamically stable, having bowel movement with minimal abdominal pain and vomiting.  Surgery was scheduled for later today and she refused again. Surgery signed off .  Later also got report from anesthesia during preoperative evaluation for this surgery that she will be a very high risk for perioperative complications due to multiple  comorbidities and morbid obesity and they were recommending going to a tertiary care center for any future surgical procedures.  Had a long discussion with patient that she need to go to a tertiary care center if her symptoms persist.  She will most likely need a surgical procedure to help with her symptoms as she has persistent due to persistent SBO which is most likely secondary to adhesions.  Patient seems understanding.  [facility] care was also consulted.  In the late afternoon received another message by nursing staff that patient now wants to proceed with surgery tomorrow.  Surgery has already signed off and will not be able to proceed with her surgical request due to no slot available in OR and her being very high risk for anesthesia.  We will try stopping TPN and if she continues to tolerate at least clear liquids she can be discharged home tomorrow so she can follow-up with a tertiary care center later at her convenience.  12/21: TPN was discontinued and she was started on full liquid which she was having difficulty to tolerate and started nausea and vomiting along with abdominal pain.  Surgery tried transfer to [place] but unfortunately nobody is willing to accept her and there is also no bed availability.  Surgery signed off as they will not be able to help anymore.  Anesthesia is not comfortable to put her under for any procedure.  We had a lengthy discussion  with patient and she has to go to [facility] herself if her conditions worsens. We will keep her on clear liquid and see how she does overnight before discharge.  12/22: Patient with some improvement in nausea and vomiting this morning, still mildly nauseated and able to tolerate Jell-O and some clear liquids.  Continue to have watery bowel movements and passing flatus.  Per patient it is more gas than BM. Slight worsening of renal function, holding torsemide today and we will resume at a lower dose from tomorrow.  Continue to have  lower extremity edema.  Had another discussion with patient and she would like to be discharged so she can seek medical attention at the hospital of her choice.  Unfortunately we have tried but will not be able to help based on her comorbidities and BMI.  We tried transfer but remained unsuccessful.  She has been given Zofran, Phenergan and pain medications and she will follow-up at [facility] for further assistance.  Patient need bariatric surgery department to help her.  She will continue on rest of home medications and will follow-up with her providers.   Assessment and Plan: * SBO (small bowel obstruction) (HCC) CT showing small bowel obstruction with transition zone in the ileum Patient repeatedly refused NG tube placement ordered by ED after an initial unsuccessful attempt General surgery was consulted and they were recommending conservative management initially and then advising surgery as she did not improve after many days and repeat imaging with persistent bowel obstruction. Repeat CT abdomen with worsening of SBO and transition point, concern of adhesion.  Patient is clinically improving, she had some nausea after eating soft diet yesterday, she was given Reglan, she is better tolerating her soft diet.  She is also having 3-4 loose stools a day, repeated KUB still has evidence of bowel obstruction.  But clinically she has been improved.  General surgery initially saw the patient, since has signed off at the patient clinically improving. At this point, she will be continued on Reglan and soft diet.  Need to refer to high-level of care for bowel obstruction and adhesion.  Please refer to [place].   AMS (altered mental status) Resolved Patient transiently became altered and febrile overnight.  Worsening leukocytosis, has been resolved Chest x-ray negative for pneumonia or pulmonary edema. CT head negative for any acute intracranial abnormality. Procalcitonin  elevated at 1.11.  UA with microscopic hematuria. Urine cultures with Enterococcus faecalis and blood cultures negative so far.  CT abdomen with worsening of SBO and no other acute abnormality.  She was given IV fluid and started on broad-spectrum antibiotics. -Continue Zosyn--completed total of 7 days course   CKD (chronic kidney disease) stage 4, GFR 15-29 ml/min (HCC) AKI with CKD stage IV. Renal function continued to improve with creatinine currently at 1.69 with baseline around 2.29. Nephrology was also consulted.  No contrast exposure.  Concern of ATN. Renal function was initially much better on fluids, now back to baseline, creatinine has been between 1.9-2.0.  Discussed with Dr. Candiss Norse, no additional treatment is needed.  Patient can be discharged home from his standpoint, patient be followed up with Dr. Holley Raring in 1 week.  I will continue hold torsemide and potassium.  Follow-up with a BMP at the next office visit.   Chronic diastolic CHF (congestive heart failure) (HCC) No volume overload.   Essential hypertension Blood pressure within goal     Chronic respiratory failure with hypoxia and hypercapnia (HCC) O2 requirement at  baseline   Obstructive lung disease (generalized) ([place]) Not acutely exacerbated Continue Pulmicort twice daily with DuoNebs as needed   Morbid obesity with BMI of 50.0-59.9, adult ([place]) Estimated body mass index is 61.31 kg/m as calculated from the following:   Height as of this encounter: '5\' 1"'$  (1.549 m).   Weight as of this encounter: 732.2 kg.    -Complicating factor to overall prognosis and care   OSA  CPAP nightly       Consultants: General surgery Procedures performed: None  Disposition: Home Diet recommendation:  Discharge Diet Orders (From admission, onward)     Start     Ordered   05/12/22 0000  Diet general       Comments: Soft diet   05/12/22 1031   05/11/22 0000  Diet - low sodium heart healthy        05/11/22 1000            Soft diet ordered for discharge DISCHARGE MEDICATION: Allergies as of 05/12/2022       Reactions   Nuvigil [armodafinil] Hives   Penicillins Diarrhea, Nausea And Vomiting   Did it involve swelling of the face/tongue/throat, SOB, or low BP? no Did it involve sudden or severe rash/hives, skin peeling, or any reaction on the inside of your mouth or nose? No Did you need to seek medical attention at a hospital or doctor's office? No When did it last happen?  in her 29s    If all above answers are "NO", may proceed with cephalosporin use.   Spironolactone Itching   Gabapentin Other (See Comments)   "wiped her out, couldn't stay awake"   Melatonin Other (See Comments)   "Brain fog"   Provigil [modafinil] Hives   Entresto [sacubitril-valsartan] Itching   Lisinopril Cough        Medication List     STOP taking these medications    Potassium Chloride ER 20 MEQ Tbcr   torsemide 20 MG tablet Commonly known as: DEMADEX       TAKE these medications    acetaminophen 500 MG tablet Commonly known as: TYLENOL Take 1,000 mg by mouth every 6 (six) hours as needed.   benzonatate 100 MG capsule Commonly known as: TESSALON TAKE 1 CAPSULE BY MOUTH THREE TIMES A DAY AS NEEDED FOR COUGH   budesonide 0.5 MG/2ML nebulizer solution Commonly known as: PULMICORT TAKE 2 ML (0.5 MG TOTAL) BY NEBULIZATION TWICE A DAY   calcitRIOL 0.25 MCG capsule Commonly known as: ROCALTROL Take 0.25 mcg by mouth daily.   dapagliflozin propanediol 10 MG Tabs tablet Commonly known as: Farxiga Take 1 tablet (10 mg total) by mouth daily before breakfast.   diltiazem 240 MG 24 hr capsule Commonly known as: CARDIZEM CD Take 1 capsule (240 mg total) by mouth daily.   feeding supplement Liqd Take 237 mLs by mouth 3 (three) times daily between meals.   ipratropium-albuterol 0.5-2.5 (3) MG/3ML Soln Commonly known as: DUONEB TAKE 3 MLS BY NEBULIZATION EVERY 6 (SIX) HOURS AS NEEDED. J45.40    metoCLOPramide 5 MG tablet Commonly known as: REGLAN Take 1 tablet (5 mg total) by mouth 3 (three) times daily before meals.   metolazone 2.5 MG tablet Commonly known as: ZAROXOLYN Take 1 tablet (2.5 mg total) by mouth 2 (two) times a week.   NON FORMULARY Bipap - nightly with Oxygen 2 Lpm   ondansetron 4 MG tablet Commonly known as: Zofran Take 1 tablet (4 mg total) by mouth daily as needed for nausea  or vomiting.   oxyCODONE HCl 7.5 MG Tabs Take 7.5 mg by mouth every 6 (six) hours as needed.   OXYGEN Inhale into the lungs. 2 LPM [place]   pantoprazole 40 MG tablet Commonly known as: PROTONIX TAKE 1 TABLET BY MOUTH EVERY DAY   Prevalite 4 g packet Generic drug: cholestyramine light DISSOLVE AND DRINK 1 PACKET BY MOUTH AT BEDTIME   promethazine 25 MG tablet Commonly known as: PHENERGAN Take 1 tablet (25 mg total) by mouth every 6 (six) hours as needed. What changed: how much to take   rOPINIRole 1 MG tablet Commonly known as: REQUIP Take 1 tablet (1 mg total) by mouth 3 (three) times daily.               Durable Medical Equipment  (From admission, onward)           Start     Ordered   05/11/22 1526  For home use only DME Hospital bed  Once       Question Answer Comment  Length of Need 6 Months   The above medical condition requires: Patient requires the ability to reposition frequently   Head must be elevated greater than: 30 degrees   Bed type Semi-electric      05/11/22 1525            Follow-up Information     Glean Hess, MD. Go on 05/17/2022.   Specialty: Internal Medicine Why: Appointment @ 2:00 pm on 05/17/22. Arrive at 1:45 pm. Contact information: [address] [place] [address] [phone]         Anthonette Legato, MD Follow up in 1 week(s).   Specialty: Nephrology Contact information: [place] [address] [phone]                Discharge Exam: Filed Weights    05/10/22 0623 05/11/22 0500 05/12/22 0427  Weight: (!) 147.5 kg (!) 146.8 kg (!) 150 kg   General exam: Appears calm and comfortable, morbid bese Respiratory system: Clear to auscultation. Respiratory effort normal. Cardiovascular system: S1 & S2 heard, RRR. No JVD, murmurs, rubs, gallops or clicks. No pedal edema. Gastrointestinal system: Abdomen is nondistended, soft and nontender. No organomegaly or masses felt. Normal bowel sounds heard. Central nervous system: Alert and oriented. No focal neurological deficits. Extremities: Symmetric 5 x 5 power. Skin: No rashes, lesions or ulcers Psychiatry: Judgement and insight appear normal. Mood & affect appropriate.    Condition at discharge: fair  The results of significant diagnostics from this hospitalization (including imaging, microbiology, ancillary and laboratory) are listed below for reference.   Imaging Studies: DG Abd 1 View  Result Date: 05/12/2022 CLINICAL DATA:  Small-bowel obstruction EXAM: ABDOMEN - 1 VIEW COMPARISON:  Radiograph 05/06/2022 FINDINGS: Persistent small bowel dilation, not significantly changed from prior. Normal caliber colon. Right upper quadrant surgical clips. Degenerative changes of the spine. IMPRESSION: Persistent small bowel obstruction. Electronically Signed   By: Maurine Simmering M.D.   On: 05/12/2022 08:15   DG Abd 1 View  Result Date: 05/06/2022 CLINICAL DATA:  Small bowel obstruction.[id number]. EXAM: ABDOMEN - 1 VIEW COMPARISON:  05/03/2022 FINDINGS: There is persistent dilatation of small bowel loops in the central abdomen, similar in appearance to prior study. Relative normal appearance of the large bowel loops. Surgical clips are noted in the RIGHT UPPER QUADRANT of the abdomen. No evidence for free intraperitoneal air. IMPRESSION: Persistent dilatation of small bowel loops in the central  abdomen compatible with small-bowel obstruction. Electronically Signed   By: Nolon Nations M.D.   On: 05/06/2022 09:00    [place] EKG SITE RITE  Result Date: 05/03/2022 If Site Rite image not attached, placement could not be confirmed due to current cardiac rhythm.  DG Abd 1 View  Result Date: 05/03/2022 CLINICAL DATA:  Abdominal pain EXAM: ABDOMEN - 1 VIEW COMPARISON:  Radiograph 05/01/2022, CT 05/01/2022 FINDINGS: Persistent small bowel dilation with decompressed colon, slightly improved from prior. Some residual contrast material is noted in the distal colon. IMPRESSION: Persistent small bowel dilation consistent with obstruction, slightly improved from prior. Electronically Signed   By: Maurine Simmering M.D.   On: 05/03/2022 08:39   DG Abd 1 View  Result Date: 05/01/2022 CLINICAL DATA:  Ileus, generalized abdominal pain. EXAM: ABDOMEN - 1 VIEW COMPARISON:  CT abdomen pelvis 05/01/2022 and abdominal radiographs 04/29/2022. FINDINGS: Persistent small bowel dilatation. Oral contrast is seen in nondilated colon, as on 05/01/2022. IMPRESSION: Small bowel obstruction. Electronically Signed   By: Lorin Picket M.D.   On: 05/01/2022 09:08   CT ABDOMEN PELVIS WO CONTRAST  Result Date: 05/01/2022 CLINICAL DATA:  Abdominal pain.  Small-bowel obstruction.  Sepsis. EXAM: CT ABDOMEN AND PELVIS WITHOUT CONTRAST TECHNIQUE: Multidetector CT imaging of the abdomen and pelvis was performed following the standard protocol without IV contrast. RADIATION DOSE REDUCTION: This exam was performed according to the departmental dose-optimization program which includes automated exposure control, adjustment of the mA and/or kV according to patient size and/or use of iterative reconstruction technique. COMPARISON:  04/26/2022 FINDINGS: Lower chest: No acute findings. Hepatobiliary: No mass visualized on this unenhanced exam. Prior cholecystectomy. No evidence of biliary obstruction. Pancreas: No mass or inflammatory process visualized on this unenhanced exam. Spleen:  Within normal limits in size. Adrenals/Urinary tract: No evidence of  urolithiasis or hydronephrosis. Unremarkable unopacified urinary bladder. Stomach/Bowel: Moderately dilated fluid-filled small bowel loops have increased since previous study. Transition point is seen in the anterior mid abdomen, likely due to adhesion. No mass or inflammatory process identified. No abnormal fluid collections seen. Vascular/Lymphatic: No pathologically enlarged lymph nodes identified. No evidence of abdominal aortic aneurysm. Reproductive: Stable 2 cm fat attenuation mass in the uterus, consistent with a benign lipoleiomyoma. Adnexal regions are unremarkable. Other:  None. Musculoskeletal: No suspicious bone lesions identified. Incidental note is again made of Paget's disease involving the left hip. IMPRESSION: Worsening small bowel obstruction with transition point in the anterior mid abdomen, likely due to adhesion. No evidence of inflammatory process or abscess. Stable small uterine lipoleiomyoma. Electronically Signed   By: Marlaine Hind M.D.   On: 05/01/2022 09:03   DG Chest Port 1 View  Result Date: 04/30/2022 CLINICAL DATA:  Sepsis EXAM: PORTABLE CHEST 1 VIEW COMPARISON:  01/23/2022 FINDINGS: Streaky density and right diaphragm elevation that is chronic based on priors. No edema or acute opacity. No effusion or pneumothorax. Normal heart size and mediastinal contours. IMPRESSION: Chronic elevation of the right diaphragm with overlying lower lobe scarring. No edema or convincing pneumonia when compared to priors. Electronically Signed   By: Jorje Guild M.D.   On: 04/30/2022 04:34   CT HEAD WO CONTRAST (5MM)  Result Date: 04/30/2022 CLINICAL DATA:  Altered mental status EXAM: CT HEAD WITHOUT CONTRAST TECHNIQUE: Contiguous axial images were obtained from the base of the skull through the vertex without intravenous contrast. RADIATION DOSE REDUCTION: This exam was performed according to the departmental dose-optimization program which includes automated exposure control, adjustment  of the mA and/or  kV according to patient size and/or use of iterative reconstruction technique. COMPARISON:  01/11/2019 FINDINGS: Brain: There is no mass, hemorrhage or extra-axial collection. The size and configuration of the ventricles and extra-axial CSF spaces are normal. There is hypoattenuation of the white matter, most commonly indicating chronic small vessel disease. Vascular: No abnormal hyperdensity of the major intracranial arteries or dural venous sinuses. No intracranial atherosclerosis. Skull: The visualized skull base, calvarium and extracranial soft tissues are normal. Sinuses/Orbits: No fluid levels or advanced mucosal thickening of the visualized paranasal sinuses. No mastoid or middle ear effusion. The orbits are normal. IMPRESSION: 1. No acute intracranial abnormality. 2. Chronic small vessel disease. Electronically Signed   By: Ulyses Jarred M.D.   On: 04/30/2022 01:48   DG Abd 1 View  Result Date: 04/29/2022 CLINICAL DATA:  Small bowel obstruction. EXAM: ABDOMEN - 1 VIEW COMPARISON:  April 27, 2022. FINDINGS: Continued small bowel dilatation is noted which may be slightly decreased compared to prior exam. Residual contrast is seen throughout the nondilated colon. IMPRESSION: Continued small bowel dilatation is noted which may be slightly decreased compared to prior exam, although residual contrast is noted throughout nondilated colon. Electronically Signed   By: Marijo Conception M.D.   On: 04/29/2022 08:11   US RENAL  Result Date: 04/28/2022 CLINICAL DATA:  Acute kidney injury. EXAM: RENAL / URINARY TRACT ULTRASOUND COMPLETE COMPARISON:  CT, 04/26/2022. FINDINGS: Right Kidney: Renal measurements: 9.2 x 4.6 x 4 cm = volume: 88 mL. Diffuse cortical thinning. Mild increased parenchymal echogenicity. No mass, stone or hydronephrosis. Left Kidney: Renal measurements: 10.4 x 4.6 x 3.3 cm = volume: 82 mL. Diffuse cortical thinning. Mild increased parenchymal echogenicity. No mass, stone or  hydronephrosis. Bladder: Not visualized. Other: None. IMPRESSION: 1. Limited study due to the patient's body habitus. 2. Findings consistent with medical renal disease with bilateral renal cortical thinning and increased renal parenchymal echogenicity. No renal mass. No hydronephrosis. Electronically Signed   By: Lajean Manes M.D.   On: 04/28/2022 09:36   DG Abd Portable 1V-Small Bowel Obstruction Protocol-initial, 8 hr delay  Result Date: 04/27/2022 CLINICAL DATA:  Small-bowel obstruction, 8 hour follow-up film EXAM: PORTABLE ABDOMEN - 1 VIEW COMPARISON:  CT from the previous day. FINDINGS: Scattered large and small bowel gas is noted. Persistent small bowel dilatation is noted proximally. Administered contrast lies predominately within the stomach although some contrast is noted within the small bowel. This extends distally into the ileum beyond the transition zone seen on prior CT examination. Minimal contrast is noted within the right colon. No free air is seen. No other focal abnormality is noted. IMPRESSION: Administered contrast has reached the right colon although persistent small bowel dilatation remains. Electronically Signed   By: Inez Catalina M.D.   On: 04/27/2022 20:00   CT ABDOMEN PELVIS WO CONTRAST  Result Date: 04/26/2022 CLINICAL DATA:  Abdominal pain, acute, nonlocalized. EXAM: CT ABDOMEN AND PELVIS WITHOUT CONTRAST TECHNIQUE: Multidetector CT imaging of the abdomen and pelvis was performed following the standard protocol without IV contrast. RADIATION DOSE REDUCTION: This exam was performed according to the departmental dose-optimization program which includes automated exposure control, adjustment of the mA and/or kV according to patient size and/or use of iterative reconstruction technique. COMPARISON:  Ultrasound 07/24/2021.  CT 08/30/2010 FINDINGS: Lower chest: Mild atelectasis or scarring at the right lung base. Hepatobiliary: Liver parenchyma appears normal without contrast.  Previous cholecystectomy. No sign of ductal dilatation given that. Pancreas: Normal Spleen: Normal Adrenals/Urinary Tract: Adrenal glands are  normal. Kidneys are normal. Bladder is normal. Stomach/Bowel: Stomach is fluid-filled. Numerous dilated fluid and air-filled loops of small intestine with transition zone in the ileum consistent with small bowel obstruction. Specific etiology is not identified. No abnormal colon finding. Vascular/Lymphatic: Aortic atherosclerosis, mild. No aneurysm. IVC is normal. No adenopathy. Reproductive: No adnexal mass.  Small uterine lipoma. Other: No free fluid or air. Musculoskeletal: Ordinary mild spinal degenerative changes. IMPRESSION: 1. Small-bowel obstruction with transition zone in the ileum. Specific etiology is not identified. 2. Aortic atherosclerosis. Aortic Atherosclerosis (ICD10-I70.0). Electronically Signed   By: Nelson Chimes M.D.   On: 04/26/2022 18:32    Microbiology: Results for orders placed or performed during the hospital encounter of 04/26/22  Culture, blood (Routine X 2) w Reflex to ID Panel     Status: None   Collection Time: 04/30/22  4:18 AM   Specimen: BLOOD LEFT ARM  Result Value Ref Range Status   Specimen Description BLOOD LEFT ARM  Final   Special Requests   Final    BOTTLES DRAWN AEROBIC AND ANAEROBIC Blood Culture adequate volume   Culture   Final    NO GROWTH 5 DAYS Performed at [facility], [address] [place], [place] [address]    Report Status 05/05/2022 FINAL  Final  Culture, blood (Routine X 2) w Reflex to ID Panel     Status: None   Collection Time: 04/30/22  5:04 AM   Specimen: BLOOD LEFT ARM  Result Value Ref Range Status   Specimen Description BLOOD LEFT ARM  Final   Special Requests   Final    BOTTLES DRAWN AEROBIC AND ANAEROBIC Blood Culture adequate volume   Culture   Final    NO GROWTH 5 DAYS Performed at [facility], [address] [place], [place] [address]    Report Status 05/05/2022  FINAL  Final  Urine Culture     Status: Abnormal   Collection Time: 04/30/22 10:02 AM   Specimen: Urine, Clean Catch  Result Value Ref Range Status   Specimen Description   Final    URINE, CLEAN CATCH Performed at [facility], [address] [place], [place] [address]    Special Requests   Final    NONE Performed at [facility], [address] [place], [place] [id number]    Culture 40,000 COLONIES/mL ENTEROCOCCUS FAECIUM (A)  Final   Report Status 05/02/2022 FINAL  Final   Organism ID, Bacteria ENTEROCOCCUS FAECIUM (A)  Final      Susceptibility   Enterococcus faecium - MIC*    AMPICILLIN <=2 SENSITIVE Sensitive     NITROFURANTOIN 64 INTERMEDIATE Intermediate     VANCOMYCIN <=0.5 SENSITIVE Sensitive     * 40,000 COLONIES/mL ENTEROCOCCUS FAECIUM    Labs: CBC: Recent Labs  Lab 05/12/22 0818  WBC 10.8*  HGB 9.7*  HCT 31.5*  MCV 79.9*  PLT 428   Basic Metabolic Panel: Recent Labs  Lab 05/07/22 0643 05/08/22 0708 05/09/22 0621 05/10/22 0551 05/11/22 0443 05/12/22 0818  NA 140 140 139 135 134* 134*  K 4.0 4.7 4.4 4.3 4.1 3.9  CL 106 108 104 101 99 100  CO2 '28 26 25 27 28 26  '$ GLUCOSE 127* 126* 106* 100* 107* 109*  BUN 57* 53* 52* 52* 48* 44*  CREATININE 1.69* 1.35* 1.56* 1.90* 2.00* 2.01*  CALCIUM 9.2 8.6* 8.7* 8.7* 8.5* 8.6*  MG 1.8 2.3 1.9 1.7 2.0  --   PHOS 2.3* 4.1 3.8 3.4 3.4  --  Liver Function Tests: Recent Labs  Lab 05/06/22 0325 05/09/22 0621  AST 18 12*  ALT 15 11  ALKPHOS 59 58  BILITOT 0.3 0.8  PROT 5.8* 6.4*  ALBUMIN 2.5* 2.8*   CBG: Recent Labs  Lab 05/07/22 2355 05/08/22 0512 05/08/22 0747 05/08/22 1133 05/08/22 1628  GLUCAP 128* 101* 118* 132* 114*    Discharge time spent: greater than 30 minutes.  Signed: Sharen Hones, MD [facility] 05/12/2022

## 2022-05-12 NOTE — Progress Notes (Signed)
Report called to [facility] (accepting unit for room [phone]) and [employer]. They do not have an ETA at this time for transfer to [facility].

## 2022-05-13 DIAGNOSIS — J9611 Chronic respiratory failure with hypoxia: Secondary | ICD-10-CM | POA: Diagnosis not present

## 2022-05-13 DIAGNOSIS — Z6841 Body Mass Index (BMI) 40.0 and over, adult: Secondary | ICD-10-CM | POA: Diagnosis not present

## 2022-05-13 DIAGNOSIS — K56609 Unspecified intestinal obstruction, unspecified as to partial versus complete obstruction: Secondary | ICD-10-CM | POA: Diagnosis not present

## 2022-05-13 LAB — BASIC METABOLIC PANEL
Anion gap: 9 (ref 5–15)
BUN: 40 mg/dL — ABNORMAL HIGH (ref 8–23)
CO2: 28 mmol/L (ref 22–32)
Calcium: 8.6 mg/dL — ABNORMAL LOW (ref 8.9–10.3)
Chloride: 99 mmol/L (ref 98–111)
Creatinine, Ser: 2.2 mg/dL — ABNORMAL HIGH (ref 0.44–1.00)
GFR, Estimated: 24 mL/min — ABNORMAL LOW (ref >60)
Glucose, Bld: 114 mg/dL — ABNORMAL HIGH (ref 70–99)
Potassium: 3.8 mmol/L (ref 3.5–5.1)
Sodium: 136 mmol/L (ref 135–145)

## 2022-05-13 LAB — CBC
HCT: 28.5 % — ABNORMAL LOW (ref 36.0–46.0)
Hemoglobin: 8.7 g/dL — ABNORMAL LOW (ref 12.0–15.0)
MCH: 24.7 pg — ABNORMAL LOW (ref 26.0–34.0)
MCHC: 30.5 g/dL (ref 30.0–36.0)
MCV: 81 fL (ref 80.0–100.0)
Platelets: 275 10*3/uL (ref 150–400)
RBC: 3.52 MIL/uL — ABNORMAL LOW (ref 3.87–5.11)
RDW: 17.5 % — ABNORMAL HIGH (ref 11.5–15.5)
WBC: 9.9 10*3/uL (ref 4.0–10.5)
nRBC: 0 % (ref 0.0–0.2)

## 2022-05-13 LAB — MAGNESIUM: Magnesium: 1.8 mg/dL (ref 1.7–2.4)

## 2022-05-13 MED ORDER — CHLORHEXIDINE GLUCONATE CLOTH 2 % EX PADS
6.0000 | MEDICATED_PAD | Freq: Every day | CUTANEOUS | Status: DC
  Administered 2022-05-13 – 2022-05-14 (×2): 6 via TOPICAL

## 2022-05-13 MED ORDER — ALBUMIN HUMAN 25 % IV SOLN
25.0000 g | Freq: Once | INTRAVENOUS | Status: AC
  Administered 2022-05-13: 25 g via INTRAVENOUS
  Filled 2022-05-13: qty 100

## 2022-05-13 NOTE — Plan of Care (Signed)

## 2022-05-13 NOTE — Progress Notes (Signed)
Spoke with [facility] who states patient is low acuity, still on the list, and they will get here when they can but do not have an ETA at this time.

## 2022-05-13 NOTE — Discharge Summary (Addendum)
Physician Discharge Summary   Patient: Samantha Mason MRN: [id number] DOB: [birth year]  Admit date:     04/26/2022  Discharge date: 05/13/22  Discharge Physician: Sharen Hones   PCP: Glean Hess, MD   Recommendations at discharge:   Transfer to [facility]   Discharge Diagnoses: Principal Problem:   Small bowel obstruction ([place]) Active Problems:   AMS (altered mental status)   CKD (chronic kidney disease) stage 4, GFR 15-29 ml/min (HCC)   Chronic diastolic CHF (congestive heart failure) (HCC)   Essential hypertension   Chronic respiratory failure with hypoxia and hypercapnia (HCC)   Obstructive lung disease (generalized) ([place])   Morbid obesity with BMI of 50.0-59.9, adult (HCC)   OSA  Acute kidney injury on chronic kidney disease stage IV secondary to ATN. Resolved Problems:   * No resolved hospital problems. [facility] Course: Taken from prior note.  Samantha Mason is a 71 y.o. female with medical history significant for diastolic CHF, and COPD with chronic respiratory failure on 2 L nasal cannula as well as obstructive sleep apnea on CPAP, stage IIIb chronic kidney disease, hypertension and morbid obesity who presents to the ED with a 3-day history of crampy upper abdominal pain and a 1 day history of nonbloody nonbilious vomiting.  She denies fever or chills, diarrhea or dysuria.  Denies chest pain or shortness of breath. ED course and data review: BP 160/83 with pulse of 103 and otherwise normal vitals.  Labs with WBC 15,200.  Sodium 130, creatinine2.29, up from baseline of 1.96.  Lipase and LFTs within normal limits.  CT abdomen and pelvis showing SBO as described below: IMPRESSION: 1. Small-bowel obstruction with transition zone in the ileum. Specific etiology is not identified. 2. Aortic atherosclerosis.  Patient was admitted for SBO, general surgery was consulted and they advised conservative management.  Patient continued to refuse NG tube placement.  Worsening renal  function. Nephrology was also consulted  12/12: Overnight patient became febrile with altered mental status.  Sepsis workup and protocol initiated by overnight provider.  No obvious source of infection so far.  CT head was negative for any acute intracranial abnormality.  Labs with worsening leukocytosis, creatinine and procalcitonin at 1.99.  She received fluid boluses and started on broad-spectrum antibiotics which include cefepime, Flagyl and vancomycin. Discontinuing vancomycin due to worsening renal function and no current diagnosis of MRSA Switching cefepime and Flagyl with Zosyn. Blood cultures still pending. CT abdomen and pelvis ordered-still pending. Worsening renal function which continued to get worse despite getting some IV fluid. Might need starting of dialysis, concern of ATN.  Nephrology is on board.  12/13: Mild tachycardia and low 100s, leukocytosis resolved, hemoglobin stable, renal function with a slight improvement of creatinine to 4.96 and GFR of 9 from 8.  Patient started getting BM and passing flatus, nausea and vomiting improved today. CT abdomen with concern of worsening SBO with transition point in the anterior mid abdomen, likely due to adhesion.  No other significant abnormality-awaiting surgery team response while restarting clear liquid diet.  12/14: Hemodynamically stable, renal functions continue to improve with creatinine of 3.8 today and GFR improved to 12.  Diet was advanced to full liquid.  Hypomagnesemia resolved.  Surgery would like to monitor for another day.  12/15: Patient again developed nausea and vomiting so she was made n.p.o. by surgery.  Repeat abdominal x-ray with persistent small bowel dilation consistent with obstruction.  Having bowel movements and passing flatus.  Surgery advised n.p.o. and TPN.  Patient currently refusing PICC line placement and TPN and would like to continue trying p.o. she was also refusing surgery saying that she does not want  to take any risk. Difficult patient as she is refusing most of the medical care advised. Discussed with her son and he will try discussing with her as it is just prolonging her length of stay without any meaningful intervention or help. Renal functions continue to improve, creatinine at 3.18 with GFR improved to 15 today.  12/16: Hemodynamically stable.  Renal functions continue to improve with creatinine at 2.85, and GFR of 17.  Electrolytes within normal limit.  Agreed to proceed with PICC line for TPN.  She is having bowel movement but continued to have nausea.  She would like to wait and still not ready for any surgery.  12/17: Hemodynamically stable, renal function continued to improve and appears to be at baseline now with creatinine of 2.31.  Nephrology is recommending continuation of LR at this time.  She was started on TPN.  Continue to have bowel movements and passing flatus, nausea and vomiting improving.  Surgery will continue keeping her n.p.o. for bowel rest.  12/18: Patient clinically having bowel movements and feeling some improvement, case to be with persistently dilated bowel, she is still hesitant to proceed with surgery and would like to try a full liquid diet 1 more time before deciding.  Surgery advance her diet to full liquid. Will continue with TPN  12/19: Hemodynamically stable, mildly elevated blood pressure at 159/74.  Renal function continued to improve with creatinine at 1.69.  Mild hypophosphatemia which will be managed with TPN.  Currently tolerating full liquid diet.  12/20: Remained hemodynamically stable, having bowel movement with minimal abdominal pain and vomiting.  Surgery was scheduled for later today and she refused again. Surgery signed off .  Later also got report from anesthesia during preoperative evaluation for this surgery that she will be a very high risk for perioperative complications due to multiple comorbidities and morbid obesity and they were  recommending going to a tertiary care center for any future surgical procedures.  Had a long discussion with patient that she need to go to a tertiary care center if her symptoms persist.  She will most likely need a surgical procedure to help with her symptoms as she has persistent due to persistent SBO which is most likely secondary to adhesions.  Patient seems understanding.  [facility] care was also consulted.  In the late afternoon received another message by nursing staff that patient now wants to proceed with surgery tomorrow.  Surgery has already signed off and will not be able to proceed with her surgical request due to no slot available in OR and her being very high risk for anesthesia.  We will try stopping TPN and if she continues to tolerate at least clear liquids she can be discharged home tomorrow so she can follow-up with a tertiary care center later at her convenience.  12/21: TPN was discontinued and she was started on full liquid which she was having difficulty to tolerate and started nausea and vomiting along with abdominal pain.  Surgery tried transfer to [facility] but unfortunately nobody is willing to accept her and there is also no bed availability.  Surgery signed off as they will not be able to help anymore.  Anesthesia is not comfortable to put her under for any procedure.  We had a lengthy discussion with patient and she has to go to  [facility] herself if her conditions worsens. We will keep her on clear liquid and see how she does overnight before discharge.  12/22: Patient with some improvement in nausea and vomiting this morning, still mildly nauseated and able to tolerate Jell-O and some clear liquids.  Continue to have watery bowel movements and passing flatus.  Per patient it is more gas than BM. Slight worsening of renal function, holding torsemide today and we will resume at a lower dose from tomorrow.  Continue to have lower extremity edema.  Had another discussion  with patient and she would like to be discharged so she can seek medical attention at the hospital of her choice.  Unfortunately we have tried but will not be able to help based on her comorbidities and BMI.  We tried transfer but remained unsuccessful.  12/24.  Patient is still not able to tolerate a diet, repeated KUB still have significant bowel function.  Discussed with general surgery, cardiology discussed with [facility], accepted by [facility] for transfer.   Assessment and Plan:  SBO (small bowel obstruction) ([place]) CT showing small bowel obstruction with transition zone in the ileum Patient repeatedly refused NG tube placement ordered by ED after an initial unsuccessful attempt General surgery was consulted and they were recommending conservative management initially and then advising surgery as she did not improve after many days and repeat imaging with persistent bowel obstruction. Repeat CT abdomen with worsening of SBO and transition point, concern of adhesion.  Patient is clinically improving, she had some nausea after eating soft diet yesterday, she was given Reglan, she is better tolerating her soft diet.  She is also having 3-4 loose stools a day, repeated KUB still has evidence of bowel obstruction.  But clinically she has been improved.  General surgery initially saw the patient, since has signed off at the patient clinically improving. At this point, she will be continued on Reglan and soft diet.  Need to refer to high-level of care for bowel obstruction and adhesion.  Please refer to [place].   AMS (altered mental status) Resolved Patient transiently became altered and febrile overnight.  Worsening leukocytosis, has been resolved Chest x-ray negative for pneumonia or pulmonary edema. CT head negative for any acute intracranial abnormality. Procalcitonin elevated at 1.11.  UA with microscopic hematuria. Urine cultures with Enterococcus faecalis and blood cultures negative  so far.  CT abdomen with worsening of SBO and no other acute abnormality.  She was given IV fluid and started on broad-spectrum antibiotics. -Continue Zosyn--completed total of 7 days course   CKD (chronic kidney disease) stage 4, GFR 15-29 ml/min (HCC) AKI with CKD stage IV.  baseline around 2.29, patient developed worsening renal function at admission. Nephrology was also consulted.  No contrast exposure.  Concern of ATN. Renal function was initially much better on fluids.  However, creatinine went up again to 2.2 today, given 25 g of albumin.   Chronic diastolic CHF (congestive heart failure) (HCC) No volume overload.   Essential hypertension Blood pressure within goal     Chronic respiratory failure with hypoxia and hypercapnia (HCC) O2 requirement at baseline   Obstructive lung disease (generalized) ([place]) Not acutely exacerbated Continue Pulmicort twice daily with DuoNebs as needed   Morbid obesity with BMI of 50.0-59.9, adult (HCC) Estimated body mass index is 61.31 kg/m as calculated from the following:   Height as of this encounter: '5\' 1"'$  (1.549 m).   Weight as of this encounter: 761.6 kg.    -Complicating  factor to overall prognosis and care   OSA  CPAP nightly       Consultants: Nephrology and General surgery Procedures performed: None  Disposition:  [facility] hospital Diet recommendation:  Discharge Diet Orders (From admission, onward)     Start     Ordered   05/12/22 0000  Diet general       Comments: Soft diet   05/12/22 1031   05/11/22 0000  Diet - low sodium heart healthy        05/11/22 1000           Full liquid diet DISCHARGE MEDICATION: Allergies as of 05/13/2022       Reactions   Nuvigil [armodafinil] Hives   Penicillins Diarrhea, Nausea And Vomiting   Did it involve swelling of the face/tongue/throat, SOB, or low BP? no Did it involve sudden or severe rash/hives, skin peeling, or any reaction on the inside of your mouth or nose? No Did  you need to seek medical attention at a hospital or doctor's office? No When did it last happen?  in her 59s    If all above answers are "NO", may proceed with cephalosporin use.   Spironolactone Itching   Gabapentin Other (See Comments)   "wiped her out, couldn't stay awake"   Melatonin Other (See Comments)   "Brain fog"   Provigil [modafinil] Hives   Entresto [sacubitril-valsartan] Itching   Lisinopril Cough        Medication List     STOP taking these medications    Potassium Chloride ER 20 MEQ Tbcr   torsemide 20 MG tablet Commonly known as: DEMADEX       TAKE these medications    acetaminophen 500 MG tablet Commonly known as: TYLENOL Take 1,000 mg by mouth every 6 (six) hours as needed.   benzonatate 100 MG capsule Commonly known as: TESSALON TAKE 1 CAPSULE BY MOUTH THREE TIMES A DAY AS NEEDED FOR COUGH   budesonide 0.5 MG/2ML nebulizer solution Commonly known as: PULMICORT TAKE 2 ML (0.5 MG TOTAL) BY NEBULIZATION TWICE A DAY   calcitRIOL 0.25 MCG capsule Commonly known as: ROCALTROL Take 0.25 mcg by mouth daily.   dapagliflozin propanediol 10 MG Tabs tablet Commonly known as: Farxiga Take 1 tablet (10 mg total) by mouth daily before breakfast.   diltiazem 240 MG 24 hr capsule Commonly known as: CARDIZEM CD Take 1 capsule (240 mg total) by mouth daily.   feeding supplement Liqd Take 237 mLs by mouth 3 (three) times daily between meals.   ipratropium-albuterol 0.5-2.5 (3) MG/3ML Soln Commonly known as: DUONEB TAKE 3 MLS BY NEBULIZATION EVERY 6 (SIX) HOURS AS NEEDED. J45.40   metoCLOPramide 5 MG tablet Commonly known as: REGLAN Take 1 tablet (5 mg total) by mouth 3 (three) times daily before meals.   metolazone 2.5 MG tablet Commonly known as: ZAROXOLYN Take 1 tablet (2.5 mg total) by mouth 2 (two) times a week.   NON FORMULARY Bipap - nightly with Oxygen 2 Lpm   ondansetron 4 MG tablet Commonly known as: Zofran Take 1 tablet (4 mg total)  by mouth daily as needed for nausea or vomiting.   oxyCODONE HCl 7.5 MG Tabs Take 7.5 mg by mouth every 6 (six) hours as needed.   OXYGEN Inhale into the lungs. 2 LPM [place]   pantoprazole 40 MG tablet Commonly known as: PROTONIX TAKE 1 TABLET BY MOUTH EVERY DAY   Prevalite 4 g packet Generic drug: cholestyramine light DISSOLVE AND DRINK 1 PACKET BY  MOUTH AT BEDTIME   promethazine 25 MG tablet Commonly known as: PHENERGAN Take 1 tablet (25 mg total) by mouth every 6 (six) hours as needed. What changed: how much to take   rOPINIRole 1 MG tablet Commonly known as: REQUIP Take 1 tablet (1 mg total) by mouth 3 (three) times daily.               Durable Medical Equipment  (From admission, onward)           Start     Ordered   05/11/22 1526  For home use only DME Hospital bed  Once       Question Answer Comment  Length of Need 6 Months   The above medical condition requires: Patient requires the ability to reposition frequently   Head must be elevated greater than: 30 degrees   Bed type Semi-electric      05/11/22 1525            Follow-up Information     Glean Hess, MD. Go on 05/17/2022.   Specialty: Internal Medicine Why: Appointment @ 2:00 pm on 05/17/22. Arrive at 1:45 pm. Contact information: [address] [place] [address] [phone]         Anthonette Legato, MD Follow up in 1 week(s).   Specialty: Nephrology Contact information: [place] [address] [phone]                Discharge Exam: Filed Weights   05/11/22 0500 05/12/22 0427 05/13/22 0405  Weight: (!) 146.8 kg (!) 150 kg (!) 147.9 kg   General exam: Appears calm and comfortable, morbidly obese Respiratory system: Clear to auscultation. Respiratory effort normal. Cardiovascular system: S1 & S2 heard, RRR. No JVD, murmurs, rubs, gallops or clicks. No pedal edema. Gastrointestinal system: Abdomen is nondistended, soft and  nontender. No organomegaly or masses felt. Normal bowel sounds heard. Central nervous system: Alert and oriented. No focal neurological deficits. Extremities: Symmetric 5 x 5 power. Skin: No rashes, lesions or ulcers Psychiatry: Judgement and insight appear normal. Mood & affect appropriate.    Condition at discharge: fair  The results of significant diagnostics from this hospitalization (including imaging, microbiology, ancillary and laboratory) are listed below for reference.   Imaging Studies: DG Abd 1 View  Result Date: 05/12/2022 CLINICAL DATA:  Small-bowel obstruction EXAM: ABDOMEN - 1 VIEW COMPARISON:  Radiograph 05/06/2022 FINDINGS: Persistent small bowel dilation, not significantly changed from prior. Normal caliber colon. Right upper quadrant surgical clips. Degenerative changes of the spine. IMPRESSION: Persistent small bowel obstruction. Electronically Signed   By: Maurine Simmering M.D.   On: 05/12/2022 08:15   DG Abd 1 View  Result Date: 05/06/2022 CLINICAL DATA:  Small bowel obstruction.[id number]. EXAM: ABDOMEN - 1 VIEW COMPARISON:  05/03/2022 FINDINGS: There is persistent dilatation of small bowel loops in the central abdomen, similar in appearance to prior study. Relative normal appearance of the large bowel loops. Surgical clips are noted in the RIGHT UPPER QUADRANT of the abdomen. No evidence for free intraperitoneal air. IMPRESSION: Persistent dilatation of small bowel loops in the central abdomen compatible with small-bowel obstruction. Electronically Signed   By: Nolon Nations M.D.   On: 05/06/2022 09:00   [place] EKG SITE RITE  Result Date: 05/03/2022 If Site Rite image not attached, placement could not be confirmed due to current cardiac rhythm.  DG Abd 1 View  Result Date: 05/03/2022 CLINICAL DATA:  Abdominal pain EXAM: ABDOMEN - 1  VIEW COMPARISON:  Radiograph 05/01/2022, CT 05/01/2022 FINDINGS: Persistent small bowel dilation with decompressed colon, slightly improved  from prior. Some residual contrast material is noted in the distal colon. IMPRESSION: Persistent small bowel dilation consistent with obstruction, slightly improved from prior. Electronically Signed   By: Maurine Simmering M.D.   On: 05/03/2022 08:39   DG Abd 1 View  Result Date: 05/01/2022 CLINICAL DATA:  Ileus, generalized abdominal pain. EXAM: ABDOMEN - 1 VIEW COMPARISON:  CT abdomen pelvis 05/01/2022 and abdominal radiographs 04/29/2022. FINDINGS: Persistent small bowel dilatation. Oral contrast is seen in nondilated colon, as on 05/01/2022. IMPRESSION: Small bowel obstruction. Electronically Signed   By: Lorin Picket M.D.   On: 05/01/2022 09:08   CT ABDOMEN PELVIS WO CONTRAST  Result Date: 05/01/2022 CLINICAL DATA:  Abdominal pain.  Small-bowel obstruction.  Sepsis. EXAM: CT ABDOMEN AND PELVIS WITHOUT CONTRAST TECHNIQUE: Multidetector CT imaging of the abdomen and pelvis was performed following the standard protocol without IV contrast. RADIATION DOSE REDUCTION: This exam was performed according to the departmental dose-optimization program which includes automated exposure control, adjustment of the mA and/or kV according to patient size and/or use of iterative reconstruction technique. COMPARISON:  04/26/2022 FINDINGS: Lower chest: No acute findings. Hepatobiliary: No mass visualized on this unenhanced exam. Prior cholecystectomy. No evidence of biliary obstruction. Pancreas: No mass or inflammatory process visualized on this unenhanced exam. Spleen:  Within normal limits in size. Adrenals/Urinary tract: No evidence of urolithiasis or hydronephrosis. Unremarkable unopacified urinary bladder. Stomach/Bowel: Moderately dilated fluid-filled small bowel loops have increased since previous study. Transition point is seen in the anterior mid abdomen, likely due to adhesion. No mass or inflammatory process identified. No abnormal fluid collections seen. Vascular/Lymphatic: No pathologically enlarged lymph  nodes identified. No evidence of abdominal aortic aneurysm. Reproductive: Stable 2 cm fat attenuation mass in the uterus, consistent with a benign lipoleiomyoma. Adnexal regions are unremarkable. Other:  None. Musculoskeletal: No suspicious bone lesions identified. Incidental note is again made of Paget's disease involving the left hip. IMPRESSION: Worsening small bowel obstruction with transition point in the anterior mid abdomen, likely due to adhesion. No evidence of inflammatory process or abscess. Stable small uterine lipoleiomyoma. Electronically Signed   By: Marlaine Hind M.D.   On: 05/01/2022 09:03   DG Chest Port 1 View  Result Date: 04/30/2022 CLINICAL DATA:  Sepsis EXAM: PORTABLE CHEST 1 VIEW COMPARISON:  01/23/2022 FINDINGS: Streaky density and right diaphragm elevation that is chronic based on priors. No edema or acute opacity. No effusion or pneumothorax. Normal heart size and mediastinal contours. IMPRESSION: Chronic elevation of the right diaphragm with overlying lower lobe scarring. No edema or convincing pneumonia when compared to priors. Electronically Signed   By: Jorje Guild M.D.   On: 04/30/2022 04:34   CT HEAD WO CONTRAST (5MM)  Result Date: 04/30/2022 CLINICAL DATA:  Altered mental status EXAM: CT HEAD WITHOUT CONTRAST TECHNIQUE: Contiguous axial images were obtained from the base of the skull through the vertex without intravenous contrast. RADIATION DOSE REDUCTION: This exam was performed according to the departmental dose-optimization program which includes automated exposure control, adjustment of the mA and/or kV according to patient size and/or use of iterative reconstruction technique. COMPARISON:  01/11/2019 FINDINGS: Brain: There is no mass, hemorrhage or extra-axial collection. The size and configuration of the ventricles and extra-axial CSF spaces are normal. There is hypoattenuation of the white matter, most commonly indicating chronic small vessel disease. Vascular:  No abnormal hyperdensity of the major intracranial arteries or dural venous  sinuses. No intracranial atherosclerosis. Skull: The visualized skull base, calvarium and extracranial soft tissues are normal. Sinuses/Orbits: No fluid levels or advanced mucosal thickening of the visualized paranasal sinuses. No mastoid or middle ear effusion. The orbits are normal. IMPRESSION: 1. No acute intracranial abnormality. 2. Chronic small vessel disease. Electronically Signed   By: Ulyses Jarred M.D.   On: 04/30/2022 01:48   DG Abd 1 View  Result Date: 04/29/2022 CLINICAL DATA:  Small bowel obstruction. EXAM: ABDOMEN - 1 VIEW COMPARISON:  April 27, 2022. FINDINGS: Continued small bowel dilatation is noted which may be slightly decreased compared to prior exam. Residual contrast is seen throughout the nondilated colon. IMPRESSION: Continued small bowel dilatation is noted which may be slightly decreased compared to prior exam, although residual contrast is noted throughout nondilated colon. Electronically Signed   By: Marijo Conception M.D.   On: 04/29/2022 08:11   US RENAL  Result Date: 04/28/2022 CLINICAL DATA:  Acute kidney injury. EXAM: RENAL / URINARY TRACT ULTRASOUND COMPLETE COMPARISON:  CT, 04/26/2022. FINDINGS: Right Kidney: Renal measurements: 9.2 x 4.6 x 4 cm = volume: 88 mL. Diffuse cortical thinning. Mild increased parenchymal echogenicity. No mass, stone or hydronephrosis. Left Kidney: Renal measurements: 10.4 x 4.6 x 3.3 cm = volume: 82 mL. Diffuse cortical thinning. Mild increased parenchymal echogenicity. No mass, stone or hydronephrosis. Bladder: Not visualized. Other: None. IMPRESSION: 1. Limited study due to the patient's body habitus. 2. Findings consistent with medical renal disease with bilateral renal cortical thinning and increased renal parenchymal echogenicity. No renal mass. No hydronephrosis. Electronically Signed   By: Lajean Manes M.D.   On: 04/28/2022 09:36   DG Abd Portable 1V-Small  Bowel Obstruction Protocol-initial, 8 hr delay  Result Date: 04/27/2022 CLINICAL DATA:  Small-bowel obstruction, 8 hour follow-up film EXAM: PORTABLE ABDOMEN - 1 VIEW COMPARISON:  CT from the previous day. FINDINGS: Scattered large and small bowel gas is noted. Persistent small bowel dilatation is noted proximally. Administered contrast lies predominately within the stomach although some contrast is noted within the small bowel. This extends distally into the ileum beyond the transition zone seen on prior CT examination. Minimal contrast is noted within the right colon. No free air is seen. No other focal abnormality is noted. IMPRESSION: Administered contrast has reached the right colon although persistent small bowel dilatation remains. Electronically Signed   By: Inez Catalina M.D.   On: 04/27/2022 20:00   CT ABDOMEN PELVIS WO CONTRAST  Result Date: 04/26/2022 CLINICAL DATA:  Abdominal pain, acute, nonlocalized. EXAM: CT ABDOMEN AND PELVIS WITHOUT CONTRAST TECHNIQUE: Multidetector CT imaging of the abdomen and pelvis was performed following the standard protocol without IV contrast. RADIATION DOSE REDUCTION: This exam was performed according to the departmental dose-optimization program which includes automated exposure control, adjustment of the mA and/or kV according to patient size and/or use of iterative reconstruction technique. COMPARISON:  Ultrasound 07/24/2021.  CT 08/30/2010 FINDINGS: Lower chest: Mild atelectasis or scarring at the right lung base. Hepatobiliary: Liver parenchyma appears normal without contrast. Previous cholecystectomy. No sign of ductal dilatation given that. Pancreas: Normal Spleen: Normal Adrenals/Urinary Tract: Adrenal glands are normal. Kidneys are normal. Bladder is normal. Stomach/Bowel: Stomach is fluid-filled. Numerous dilated fluid and air-filled loops of small intestine with transition zone in the ileum consistent with small bowel obstruction. Specific etiology is not  identified. No abnormal colon finding. Vascular/Lymphatic: Aortic atherosclerosis, mild. No aneurysm. IVC is normal. No adenopathy. Reproductive: No adnexal mass.  Small uterine lipoma. Other: No free fluid  or air. Musculoskeletal: Ordinary mild spinal degenerative changes. IMPRESSION: 1. Small-bowel obstruction with transition zone in the ileum. Specific etiology is not identified. 2. Aortic atherosclerosis. Aortic Atherosclerosis (ICD10-I70.0). Electronically Signed   By: Nelson Chimes M.D.   On: 04/26/2022 18:32    Microbiology: Results for orders placed or performed during the hospital encounter of 04/26/22  Culture, blood (Routine X 2) w Reflex to ID Panel     Status: None   Collection Time: 04/30/22  4:18 AM   Specimen: BLOOD LEFT ARM  Result Value Ref Range Status   Specimen Description BLOOD LEFT ARM  Final   Special Requests   Final    BOTTLES DRAWN AEROBIC AND ANAEROBIC Blood Culture adequate volume   Culture   Final    NO GROWTH 5 DAYS Performed at [facility], [address] [place], [place] [address]    Report Status 05/05/2022 FINAL  Final  Culture, blood (Routine X 2) w Reflex to ID Panel     Status: None   Collection Time: 04/30/22  5:04 AM   Specimen: BLOOD LEFT ARM  Result Value Ref Range Status   Specimen Description BLOOD LEFT ARM  Final   Special Requests   Final    BOTTLES DRAWN AEROBIC AND ANAEROBIC Blood Culture adequate volume   Culture   Final    NO GROWTH 5 DAYS Performed at [facility], [address] [place], [place] [address]    Report Status 05/05/2022 FINAL  Final  Urine Culture     Status: Abnormal   Collection Time: 04/30/22 10:02 AM   Specimen: Urine, Clean Catch  Result Value Ref Range Status   Specimen Description   Final    URINE, CLEAN CATCH Performed at [facility], [address], [place], [place] [address]    Special Requests   Final    NONE Performed at [facility], [address]  [place], [place] [address]    Culture 40,000 COLONIES/mL ENTEROCOCCUS FAECIUM (A)  Final   Report Status 05/02/2022 FINAL  Final   Organism ID, Bacteria ENTEROCOCCUS FAECIUM (A)  Final      Susceptibility   Enterococcus faecium - MIC*    AMPICILLIN <=2 SENSITIVE Sensitive     NITROFURANTOIN 64 INTERMEDIATE Intermediate     VANCOMYCIN <=0.5 SENSITIVE Sensitive     * 40,000 COLONIES/mL ENTEROCOCCUS FAECIUM    Labs: CBC: Recent Labs  Lab 05/12/22 0818 05/13/22 0508  WBC 10.8* 9.9  HGB 9.7* 8.7*  HCT 31.5* 28.5*  MCV 79.9* 81.0  PLT 292 500   Basic Metabolic Panel: Recent Labs  Lab 05/07/22 0643 05/08/22 0708 05/09/22 0621 05/10/22 0551 05/11/22 0443 05/12/22 0818 05/13/22 0508  NA 140 140 139 135 134* 134* 136  K 4.0 4.7 4.4 4.3 4.1 3.9 3.8  CL 106 108 104 101 99 100 99  CO2 '28 26 25 27 28 26 28  '$ GLUCOSE 127* 126* 106* 100* 107* 109* 114*  BUN 57* 53* 52* 52* 48* 44* 40*  CREATININE 1.69* 1.35* 1.56* 1.90* 2.00* 2.01* 2.20*  CALCIUM 9.2 8.6* 8.7* 8.7* 8.5* 8.6* 8.6*  MG 1.8 2.3 1.9 1.7 2.0  --  1.8  PHOS 2.3* 4.1 3.8 3.4 3.4  --   --    Liver Function Tests: Recent Labs  Lab 05/09/22 0621  AST 12*  ALT 11  ALKPHOS 58  BILITOT 0.8  PROT 6.4*  ALBUMIN 2.8*   CBG: Recent Labs  Lab 05/07/22 2355 05/08/22 9381  05/08/22 0747 05/08/22 1133 05/08/22 1628  GLUCAP 128* 101* 118* 132* 114*    Discharge time spent: greater than 30 minutes.  Signed: Sharen Hones, MD [facility] 05/13/2022

## 2022-05-13 NOTE — Progress Notes (Signed)
I called [facility] at the request of [facility] transport to place her on the list for transport. Report given to Gay Filler, she thinks ETA will be after 7am but she is on the list. Please call receiving unit at [facility] once transport is here to get the pt at [phone].

## 2022-05-14 ENCOUNTER — Inpatient Hospital Stay: Payer: Medicare Other

## 2022-05-14 DIAGNOSIS — Z6841 Body Mass Index (BMI) 40.0 and over, adult: Secondary | ICD-10-CM | POA: Diagnosis not present

## 2022-05-14 DIAGNOSIS — I5032 Chronic diastolic (congestive) heart failure: Secondary | ICD-10-CM | POA: Diagnosis not present

## 2022-05-14 DIAGNOSIS — K56609 Unspecified intestinal obstruction, unspecified as to partial versus complete obstruction: Secondary | ICD-10-CM | POA: Diagnosis not present

## 2022-05-14 LAB — BASIC METABOLIC PANEL
Anion gap: 9 (ref 5–15)
BUN: 36 mg/dL — ABNORMAL HIGH (ref 8–23)
CO2: 26 mmol/L (ref 22–32)
Calcium: 8.9 mg/dL (ref 8.9–10.3)
Chloride: 98 mmol/L (ref 98–111)
Creatinine, Ser: 2.01 mg/dL — ABNORMAL HIGH (ref 0.44–1.00)
GFR, Estimated: 26 mL/min — ABNORMAL LOW (ref >60)
Glucose, Bld: 107 mg/dL — ABNORMAL HIGH (ref 70–99)
Potassium: 4 mmol/L (ref 3.5–5.1)
Sodium: 133 mmol/L — ABNORMAL LOW (ref 135–145)

## 2022-05-14 NOTE — TOC Progression Note (Signed)
Transition of Care [facility]) - Progression Note    Patient Details  Name: Samantha Mason MRN: [id number] Date of Birth: 05/01/1951  [facility] CM/SW Contact  Gerilyn Pilgrim, LCSW Phone Number: 05/14/2022, 9:26 AM  Clinical Narrative:   SW spoke with pt to inform her that she can complete a second level appeal for 12/24, 12/25. No additional needs at this time, Pt to transfer to [facility].       Barriers to Discharge: Barriers Resolved  Expected Discharge Plan and Services         Expected Discharge Date: 05/12/22               DME Arranged: Hospital bed DME Agency: [employer] Date DME Agency Contacted: 05/11/22   Representative spoke with at DME Agency: [place]: RN [place]: [place] ([place]) Date [place]: 05/11/22   Representative spoke with at [place]: [place] ([place]) Interventions [place]: No Food Insecurity (04/27/2022)  Housing: Low Risk  (04/27/2022)  Transportation Needs: No Transportation Needs (04/27/2022)  Utilities: Not At Risk (04/27/2022)  Alcohol Screen: Low Risk  (08/08/2021)  Depression (PHQ2-9): Medium Risk (02/25/2022)  Financial Resource Strain: Medium Risk (08/14/2021)  Physical Activity: Inactive (08/08/2021)  Social Connections: Socially Isolated (08/08/2021)  Stress: No Stress Concern Present (08/08/2021)  Tobacco Use: Low Risk  (05/08/2022)    Readmission Risk Interventions     No data to display

## 2022-05-14 NOTE — Plan of Care (Signed)

## 2022-05-14 NOTE — Progress Notes (Signed)
Palliative: Chart review completed.  Samantha Mason's 17-day hospital stay has been complicated by small bowel obstruction, initially declining surgery.   At this point surgery has signed off and recommended at this point she is scheduled for transfer to [facility] via [facility] with no ETA.  She has also experienced acute kidney injury that is recurrent but creatinine is fluctuating close to baseline.  She is to follow-up with nephrology outpatient as needed.   Samantha Mason is active with home health services and declines outpatient palliative care.  Conference with attending, bedside nursing staff, transition of care team related to patient condition, needs, goals of care, disposition.  Plan: Continue full scope/full code.  Awaiting transfer to [facility].  Declines outpatient palliative care  No charge Quinn Axe, NP Palliative medicine team Team phone [phone] Greater than 50% of this time was spent counseling and coordinating care related to the above assessment and plan.

## 2022-05-14 NOTE — Discharge Summary (Signed)
Physician Discharge Summary   Patient: Samantha Mason MRN: [id number] DOB: 10-Oct-1950  Admit date:     04/26/2022  Discharge date: 05/14/22  Discharge Physician: Sharen Hones   PCP: Glean Hess, MD   Recommendations at discharge:   Follow-up with PCP in 1 week. Please refer patient to [facility] general surgery for follow-up  Discharge Diagnoses: Principal Problem:   Small bowel obstruction ([place]) Active Problems:   AMS (altered mental status)   CKD (chronic kidney disease) stage 4, GFR 15-29 ml/min (HCC)   Chronic diastolic CHF (congestive heart failure) (HCC)   Essential hypertension   Chronic respiratory failure with hypoxia and hypercapnia (HCC)   Obstructive lung disease (generalized) ([place])   Morbid obesity with BMI of 50.0-59.9, adult (HCC)   OSA  Acute kidney injury on chronic kidney disease stage IV secondary to ATN.  Resolved Problems:   * No resolved hospital problems. [facility] Course: Taken from prior note.  Samantha Mason is a 71 y.o. female with medical history significant for diastolic CHF, and COPD with chronic respiratory failure on 2 L nasal cannula as well as obstructive sleep apnea on CPAP, stage IIIb chronic kidney disease, hypertension and morbid obesity who presents to the ED with a 3-day history of crampy upper abdominal pain and a 1 day history of nonbloody nonbilious vomiting.  She denies fever or chills, diarrhea or dysuria.  Denies chest pain or shortness of breath. ED course and data review: BP 160/83 with pulse of 103 and otherwise normal vitals.  Labs with WBC 15,200.  Sodium 130, creatinine2.29, up from baseline of 1.96.  Lipase and LFTs within normal limits.  CT abdomen and pelvis showing SBO as described below: IMPRESSION: 1. Small-bowel obstruction with transition zone in the ileum. Specific etiology is not identified. 2. Aortic atherosclerosis.  Patient was admitted for SBO, general surgery was consulted and they advised conservative  management.  Patient continued to refuse NG tube placement.  Worsening renal function. Nephrology was also consulted  12/12: Overnight patient became febrile with altered mental status.  Sepsis workup and protocol initiated by overnight provider.  No obvious source of infection so far.  CT head was negative for any acute intracranial abnormality.  Labs with worsening leukocytosis, creatinine and procalcitonin at 1.99.  She received fluid boluses and started on broad-spectrum antibiotics which include cefepime, Flagyl and vancomycin. Discontinuing vancomycin due to worsening renal function and no current diagnosis of MRSA Switching cefepime and Flagyl with Zosyn. Blood cultures still pending. CT abdomen and pelvis ordered-still pending. Worsening renal function which continued to get worse despite getting some IV fluid. Might need starting of dialysis, concern of ATN.  Nephrology is on board.  12/13: Mild tachycardia and low 100s, leukocytosis resolved, hemoglobin stable, renal function with a slight improvement of creatinine to 4.96 and GFR of 9 from 8.  Patient started getting BM and passing flatus, nausea and vomiting improved today. CT abdomen with concern of worsening SBO with transition point in the anterior mid abdomen, likely due to adhesion.  No other significant abnormality-awaiting surgery team response while restarting clear liquid diet.  12/14: Hemodynamically stable, renal functions continue to improve with creatinine of 3.8 today and GFR improved to 12.  Diet was advanced to full liquid.  Hypomagnesemia resolved.  Surgery would like to monitor for another day.  12/15: Patient again developed nausea and vomiting so she was made n.p.o. by surgery.  Repeat abdominal x-ray with persistent small bowel dilation consistent with obstruction.  Having bowel movements and passing flatus.  Surgery advised n.p.o. and TPN.  Patient currently refusing PICC line placement and TPN and would like to  continue trying p.o. she was also refusing surgery saying that she does not want to take any risk. Difficult patient as she is refusing most of the medical care advised. Discussed with her son and he will try discussing with her as it is just prolonging her length of stay without any meaningful intervention or help. Renal functions continue to improve, creatinine at 3.18 with GFR improved to 15 today.  12/16: Hemodynamically stable.  Renal functions continue to improve with creatinine at 2.85, and GFR of 17.  Electrolytes within normal limit.  Agreed to proceed with PICC line for TPN.  She is having bowel movement but continued to have nausea.  She would like to wait and still not ready for any surgery.  12/17: Hemodynamically stable, renal function continued to improve and appears to be at baseline now with creatinine of 2.31.  Nephrology is recommending continuation of LR at this time.  She was started on TPN.  Continue to have bowel movements and passing flatus, nausea and vomiting improving.  Surgery will continue keeping her n.p.o. for bowel rest.  12/18: Patient clinically having bowel movements and feeling some improvement, case to be with persistently dilated bowel, she is still hesitant to proceed with surgery and would like to try a full liquid diet 1 more time before deciding.  Surgery advance her diet to full liquid. Will continue with TPN  12/19: Hemodynamically stable, mildly elevated blood pressure at 159/74.  Renal function continued to improve with creatinine at 1.69.  Mild hypophosphatemia which will be managed with TPN.  Currently tolerating full liquid diet.  12/20: Remained hemodynamically stable, having bowel movement with minimal abdominal pain and vomiting.  Surgery was scheduled for later today and she refused again. Surgery signed off .  Later also got report from anesthesia during preoperative evaluation for this surgery that she will be a very high risk for perioperative  complications due to multiple comorbidities and morbid obesity and they were recommending going to a tertiary care center for any future surgical procedures.  Had a long discussion with patient that she need to go to a tertiary care center if her symptoms persist.  She will most likely need a surgical procedure to help with her symptoms as she has persistent due to persistent SBO which is most likely secondary to adhesions.  Patient seems understanding.  [facility] care was also consulted.  In the late afternoon received another message by nursing staff that patient now wants to proceed with surgery tomorrow.  Surgery has already signed off and will not be able to proceed with her surgical request due to no slot available in OR and her being very high risk for anesthesia.  We will try stopping TPN and if she continues to tolerate at least clear liquids she can be discharged home tomorrow so she can follow-up with a tertiary care center later at her convenience.  12/21: TPN was discontinued and she was started on full liquid which she was having difficulty to tolerate and started nausea and vomiting along with abdominal pain.  Surgery tried transfer to [place] but unfortunately nobody is willing to accept her and there is also no bed availability.  Surgery signed off as they will not be able to help anymore.  Anesthesia is not comfortable to put her under for any procedure.  We had a lengthy discussion with patient and she has to go to [facility] herself if her conditions worsens. We will keep her on clear liquid and see how she does overnight before discharge.  12/22: Patient with some improvement in nausea and vomiting this morning, still mildly nauseated and able to tolerate Jell-O and some clear liquids.  Continue to have watery bowel movements and passing flatus.  Per patient it is more gas than BM. Slight worsening of renal function, holding torsemide today and we will resume at a lower dose  from tomorrow.  Continue to have lower extremity edema.  Had another discussion with patient and she would like to be discharged so she can seek medical attention at the hospital of her choice.  Unfortunately we have tried but will not be able to help based on her comorbidities and BMI.  We tried transfer but remained unsuccessful.  12/24.  Patient is still not able to tolerate a diet, repeated KUB still have significant bowel function.  Discussed with general surgery, cardiology discussed with [facility], accepted by [facility] for transfer.  12/26.  Patient has been waiting for transfer to [facility] since 12/24.  But was unable to find transportation.  While in the hospital, patient condition appears to be improved.  She is tolerating soft diet today.  Repeat KUB showed nonobstructive pattern, patient now refused the transfer.  Patient will be discharged home in stable condition.   Assessment and Plan: SBO (small bowel obstruction) ([place]) CT showing small bowel obstruction with transition zone in the ileum Patient repeatedly refused NG tube placement ordered by ED after an initial unsuccessful attempt General surgery was consulted and they were recommending conservative management initially and then advising surgery as she did not improve after many days and repeat imaging with persistent bowel obstruction. Repeat CT abdomen with worsening of SBO and transition point, concern of adhesion.  Patient was accepted to [facility], had to wait for transport.  While in the hospital, patient condition improved, she was able to follow to soft diet.  He is also having daily bowel movements which are loose/normal.  Repeat KUB showed nonobstructive bowel  pattern, she is medically stable to be discharged now.  She will be followed by PCP and referred to general surgery from [facility].   AMS (altered mental status) Resolved Patient transiently became altered and febrile overnight.  Worsening leukocytosis, has been resolved Chest x-ray negative  for pneumonia or pulmonary edema. CT head negative for any acute intracranial abnormality. Procalcitonin elevated at 1.11.  UA with microscopic hematuria. Urine cultures with Enterococcus faecalis and blood cultures negative so far.  CT abdomen with worsening of SBO and no other acute abnormality.  She was given IV fluid and started on broad-spectrum antibiotics. -Continue Zosyn--completed total of 7 days course   CKD (chronic kidney disease) stage 4, GFR 15-29 ml/min (HCC) AKI with CKD stage IV.  baseline around 2.29, patient developed worsening renal function at admission. Nephrology was also consulted.  No contrast exposure.  Concern of ATN. Renal function was initially much better on fluids.  She also received a dose of albumin yesterday, renal function still stable.  Follow-up with Dr. Holley Raring in office.   Chronic diastolic CHF (congestive heart failure) (HCC) No volume overload.   Essential hypertension Blood pressure within goal     Chronic respiratory failure with hypoxia and hypercapnia (HCC) O2 requirement at baseline   Obstructive lung disease (generalized) ([place]) Not acutely exacerbated Continue Pulmicort twice daily with DuoNebs as needed  Morbid obesity with BMI of 50.0-59.9, adult ([place]) Estimated body mass index is 61.31 kg/m as calculated from the following:   Height as of this encounter: '5\' 1"'$  (1.549 m).   Weight as of this encounter: 950.9 kg.    -Complicating factor to overall prognosis and care   OSA  CPAP nightly       Consultants: General Surgery, Nephrology Procedures performed: None  Disposition: Home health Diet recommendation:  Discharge Diet Orders (From admission, onward)     Start     Ordered   05/12/22 0000  Diet general       Comments: Soft diet   05/12/22 1031   05/11/22 0000  Diet - low sodium heart healthy        05/11/22 1000           Cardiac diet DISCHARGE MEDICATION: Allergies as of 05/14/2022       Reactions    Nuvigil [armodafinil] Hives   Penicillins Diarrhea, Nausea And Vomiting   Did it involve swelling of the face/tongue/throat, SOB, or low BP? no Did it involve sudden or severe rash/hives, skin peeling, or any reaction on the inside of your mouth or nose? No Did you need to seek medical attention at a hospital or doctor's office? No When did it last happen?  in her 10s    If all above answers are "NO", may proceed with cephalosporin use.   Spironolactone Itching   Gabapentin Other (See Comments)   "wiped her out, couldn't stay awake"   Melatonin Other (See Comments)   "Brain fog"   Provigil [modafinil] Hives   Entresto [sacubitril-valsartan] Itching   Lisinopril Cough        Medication List     STOP taking these medications    Potassium Chloride ER 20 MEQ Tbcr   torsemide 20 MG tablet Commonly known as: DEMADEX       TAKE these medications    acetaminophen 500 MG tablet Commonly known as: TYLENOL Take 1,000 mg by mouth every 6 (six) hours as needed.   benzonatate 100 MG capsule Commonly known as: TESSALON TAKE 1 CAPSULE BY MOUTH THREE TIMES A DAY AS NEEDED FOR COUGH   budesonide 0.5 MG/2ML nebulizer solution Commonly known as: PULMICORT TAKE 2 ML (0.5 MG TOTAL) BY NEBULIZATION TWICE A DAY   calcitRIOL 0.25 MCG capsule Commonly known as: ROCALTROL Take 0.25 mcg by mouth daily.   dapagliflozin propanediol 10 MG Tabs tablet Commonly known as: Farxiga Take 1 tablet (10 mg total) by mouth daily before breakfast.   diltiazem 240 MG 24 hr capsule Commonly known as: CARDIZEM CD Take 1 capsule (240 mg total) by mouth daily.   feeding supplement Liqd Take 237 mLs by mouth 3 (three) times daily between meals.   ipratropium-albuterol 0.5-2.5 (3) MG/3ML Soln Commonly known as: DUONEB TAKE 3 MLS BY NEBULIZATION EVERY 6 (SIX) HOURS AS NEEDED. J45.40   metoCLOPramide 5 MG tablet Commonly known as: REGLAN Take 1 tablet (5 mg total) by mouth 3 (three) times daily  before meals.   metolazone 2.5 MG tablet Commonly known as: ZAROXOLYN Take 1 tablet (2.5 mg total) by mouth 2 (two) times a week.   NON FORMULARY Bipap - nightly with Oxygen 2 Lpm   ondansetron 4 MG tablet Commonly known as: Zofran Take 1 tablet (4 mg total) by mouth daily as needed for nausea or vomiting.   oxyCODONE HCl 7.5 MG Tabs Take 7.5 mg by mouth every 6 (six) hours as needed.  OXYGEN Inhale into the lungs. 2 LPM [place]   pantoprazole 40 MG tablet Commonly known as: PROTONIX TAKE 1 TABLET BY MOUTH EVERY DAY   Prevalite 4 g packet Generic drug: cholestyramine light DISSOLVE AND DRINK 1 PACKET BY MOUTH AT BEDTIME   promethazine 25 MG tablet Commonly known as: PHENERGAN Take 1 tablet (25 mg total) by mouth every 6 (six) hours as needed. What changed: how much to take   rOPINIRole 1 MG tablet Commonly known as: REQUIP Take 1 tablet (1 mg total) by mouth 3 (three) times daily.               Durable Medical Equipment  (From admission, onward)           Start     Ordered   05/11/22 1526  For home use only DME Hospital bed  Once       Question Answer Comment  Length of Need 6 Months   The above medical condition requires: Patient requires the ability to reposition frequently   Head must be elevated greater than: 30 degrees   Bed type Semi-electric      05/11/22 1525            Follow-up Information     Glean Hess, MD. Go on 05/17/2022.   Specialty: Internal Medicine Why: Appointment @ 2:00 pm on 05/17/22. Arrive at 1:45 pm. Contact information: [address] [place] [address] [phone]         Anthonette Legato, MD Follow up in 1 week(s).   Specialty: Nephrology Contact information: [place] [address] [phone]                Discharge Exam: Filed Weights   05/11/22 0500 05/12/22 0427 05/13/22 0405  Weight: (!) 146.8 kg (!) 150 kg (!) 147.9 kg   General exam: Appears  calm and comfortable, Morbid obese Respiratory system: Clear to auscultation. Respiratory effort normal. Cardiovascular system: S1 & S2 heard, RRR. No JVD, murmurs, rubs, gallops or clicks. No pedal edema. Gastrointestinal system: Abdomen is nondistended, soft and nontender. No organomegaly or masses felt. Normal bowel sounds heard. Central nervous system: Alert and oriented. No focal neurological deficits. Extremities: Symmetric 5 x 5 power. Skin: No rashes, lesions or ulcers Psychiatry: Judgement and insight appear normal. Mood & affect appropriate.    Condition at discharge: good  The results of significant diagnostics from this hospitalization (including imaging, microbiology, ancillary and laboratory) are listed below for reference.   Imaging Studies: DG Abd 1 View  Result Date: 05/14/2022 CLINICAL DATA:  Small bowel obstruction EXAM: ABDOMEN - 1 VIEW COMPARISON:  05/12/2022 FINDINGS: No bowel dilatation to suggest obstruction. No evidence of pneumoperitoneum, portal venous gas or pneumatosis. No pathologic calcifications along the expected course of the ureters. No acute osseous abnormality. IMPRESSION: 1. Nonobstructive bowel gas pattern. Electronically Signed   By: Kathreen Devoid M.D.   On: 05/14/2022 11:06   DG Abd 1 View  Result Date: 05/12/2022 CLINICAL DATA:  Small-bowel obstruction EXAM: ABDOMEN - 1 VIEW COMPARISON:  Radiograph 05/06/2022 FINDINGS: Persistent small bowel dilation, not significantly changed from prior. Normal caliber colon. Right upper quadrant surgical clips. Degenerative changes of the spine. IMPRESSION: Persistent small bowel obstruction. Electronically Signed   By: Maurine Simmering M.D.   On: 05/12/2022 08:15   DG Abd 1 View  Result Date: 05/06/2022 CLINICAL DATA:  Small bowel obstruction.[id number]. EXAM: ABDOMEN - 1 VIEW COMPARISON:  05/03/2022 FINDINGS: There is  persistent dilatation of small bowel loops in the central abdomen, similar in appearance to prior  study. Relative normal appearance of the large bowel loops. Surgical clips are noted in the RIGHT UPPER QUADRANT of the abdomen. No evidence for free intraperitoneal air. IMPRESSION: Persistent dilatation of small bowel loops in the central abdomen compatible with small-bowel obstruction. Electronically Signed   By: Nolon Nations M.D.   On: 05/06/2022 09:00   [place] EKG SITE RITE  Result Date: 05/03/2022 If Site Rite image not attached, placement could not be confirmed due to current cardiac rhythm.  DG Abd 1 View  Result Date: 05/03/2022 CLINICAL DATA:  Abdominal pain EXAM: ABDOMEN - 1 VIEW COMPARISON:  Radiograph 05/01/2022, CT 05/01/2022 FINDINGS: Persistent small bowel dilation with decompressed colon, slightly improved from prior. Some residual contrast material is noted in the distal colon. IMPRESSION: Persistent small bowel dilation consistent with obstruction, slightly improved from prior. Electronically Signed   By: Maurine Simmering M.D.   On: 05/03/2022 08:39   DG Abd 1 View  Result Date: 05/01/2022 CLINICAL DATA:  Ileus, generalized abdominal pain. EXAM: ABDOMEN - 1 VIEW COMPARISON:  CT abdomen pelvis 05/01/2022 and abdominal radiographs 04/29/2022. FINDINGS: Persistent small bowel dilatation. Oral contrast is seen in nondilated colon, as on 05/01/2022. IMPRESSION: Small bowel obstruction. Electronically Signed   By: Lorin Picket M.D.   On: 05/01/2022 09:08   CT ABDOMEN PELVIS WO CONTRAST  Result Date: 05/01/2022 CLINICAL DATA:  Abdominal pain.  Small-bowel obstruction.  Sepsis. EXAM: CT ABDOMEN AND PELVIS WITHOUT CONTRAST TECHNIQUE: Multidetector CT imaging of the abdomen and pelvis was performed following the standard protocol without IV contrast. RADIATION DOSE REDUCTION: This exam was performed according to the departmental dose-optimization program which includes automated exposure control, adjustment of the mA and/or kV according to patient size and/or use of iterative  reconstruction technique. COMPARISON:  04/26/2022 FINDINGS: Lower chest: No acute findings. Hepatobiliary: No mass visualized on this unenhanced exam. Prior cholecystectomy. No evidence of biliary obstruction. Pancreas: No mass or inflammatory process visualized on this unenhanced exam. Spleen:  Within normal limits in size. Adrenals/Urinary tract: No evidence of urolithiasis or hydronephrosis. Unremarkable unopacified urinary bladder. Stomach/Bowel: Moderately dilated fluid-filled small bowel loops have increased since previous study. Transition point is seen in the anterior mid abdomen, likely due to adhesion. No mass or inflammatory process identified. No abnormal fluid collections seen. Vascular/Lymphatic: No pathologically enlarged lymph nodes identified. No evidence of abdominal aortic aneurysm. Reproductive: Stable 2 cm fat attenuation mass in the uterus, consistent with a benign lipoleiomyoma. Adnexal regions are unremarkable. Other:  None. Musculoskeletal: No suspicious bone lesions identified. Incidental note is again made of Paget's disease involving the left hip. IMPRESSION: Worsening small bowel obstruction with transition point in the anterior mid abdomen, likely due to adhesion. No evidence of inflammatory process or abscess. Stable small uterine lipoleiomyoma. Electronically Signed   By: Marlaine Hind M.D.   On: 05/01/2022 09:03   DG Chest Port 1 View  Result Date: 04/30/2022 CLINICAL DATA:  Sepsis EXAM: PORTABLE CHEST 1 VIEW COMPARISON:  01/23/2022 FINDINGS: Streaky density and right diaphragm elevation that is chronic based on priors. No edema or acute opacity. No effusion or pneumothorax. Normal heart size and mediastinal contours. IMPRESSION: Chronic elevation of the right diaphragm with overlying lower lobe scarring. No edema or convincing pneumonia when compared to priors. Electronically Signed   By: Jorje Guild M.D.   On: 04/30/2022 04:34   CT HEAD WO CONTRAST (5MM)  Result Date:  04/30/2022  CLINICAL DATA:  Altered mental status EXAM: CT HEAD WITHOUT CONTRAST TECHNIQUE: Contiguous axial images were obtained from the base of the skull through the vertex without intravenous contrast. RADIATION DOSE REDUCTION: This exam was performed according to the departmental dose-optimization program which includes automated exposure control, adjustment of the mA and/or kV according to patient size and/or use of iterative reconstruction technique. COMPARISON:  01/11/2019 FINDINGS: Brain: There is no mass, hemorrhage or extra-axial collection. The size and configuration of the ventricles and extra-axial CSF spaces are normal. There is hypoattenuation of the white matter, most commonly indicating chronic small vessel disease. Vascular: No abnormal hyperdensity of the major intracranial arteries or dural venous sinuses. No intracranial atherosclerosis. Skull: The visualized skull base, calvarium and extracranial soft tissues are normal. Sinuses/Orbits: No fluid levels or advanced mucosal thickening of the visualized paranasal sinuses. No mastoid or middle ear effusion. The orbits are normal. IMPRESSION: 1. No acute intracranial abnormality. 2. Chronic small vessel disease. Electronically Signed   By: Ulyses Jarred M.D.   On: 04/30/2022 01:48   DG Abd 1 View  Result Date: 04/29/2022 CLINICAL DATA:  Small bowel obstruction. EXAM: ABDOMEN - 1 VIEW COMPARISON:  April 27, 2022. FINDINGS: Continued small bowel dilatation is noted which may be slightly decreased compared to prior exam. Residual contrast is seen throughout the nondilated colon. IMPRESSION: Continued small bowel dilatation is noted which may be slightly decreased compared to prior exam, although residual contrast is noted throughout nondilated colon. Electronically Signed   By: Marijo Conception M.D.   On: 04/29/2022 08:11   US RENAL  Result Date: 04/28/2022 CLINICAL DATA:  Acute kidney injury. EXAM: RENAL / URINARY TRACT ULTRASOUND COMPLETE  COMPARISON:  CT, 04/26/2022. FINDINGS: Right Kidney: Renal measurements: 9.2 x 4.6 x 4 cm = volume: 88 mL. Diffuse cortical thinning. Mild increased parenchymal echogenicity. No mass, stone or hydronephrosis. Left Kidney: Renal measurements: 10.4 x 4.6 x 3.3 cm = volume: 82 mL. Diffuse cortical thinning. Mild increased parenchymal echogenicity. No mass, stone or hydronephrosis. Bladder: Not visualized. Other: None. IMPRESSION: 1. Limited study due to the patient's body habitus. 2. Findings consistent with medical renal disease with bilateral renal cortical thinning and increased renal parenchymal echogenicity. No renal mass. No hydronephrosis. Electronically Signed   By: Lajean Manes M.D.   On: 04/28/2022 09:36   DG Abd Portable 1V-Small Bowel Obstruction Protocol-initial, 8 hr delay  Result Date: 04/27/2022 CLINICAL DATA:  Small-bowel obstruction, 8 hour follow-up film EXAM: PORTABLE ABDOMEN - 1 VIEW COMPARISON:  CT from the previous day. FINDINGS: Scattered large and small bowel gas is noted. Persistent small bowel dilatation is noted proximally. Administered contrast lies predominately within the stomach although some contrast is noted within the small bowel. This extends distally into the ileum beyond the transition zone seen on prior CT examination. Minimal contrast is noted within the right colon. No free air is seen. No other focal abnormality is noted. IMPRESSION: Administered contrast has reached the right colon although persistent small bowel dilatation remains. Electronically Signed   By: Inez Catalina M.D.   On: 04/27/2022 20:00   CT ABDOMEN PELVIS WO CONTRAST  Result Date: 04/26/2022 CLINICAL DATA:  Abdominal pain, acute, nonlocalized. EXAM: CT ABDOMEN AND PELVIS WITHOUT CONTRAST TECHNIQUE: Multidetector CT imaging of the abdomen and pelvis was performed following the standard protocol without IV contrast. RADIATION DOSE REDUCTION: This exam was performed according to the departmental  dose-optimization program which includes automated exposure control, adjustment of the mA and/or kV according to  patient size and/or use of iterative reconstruction technique. COMPARISON:  Ultrasound 07/24/2021.  CT 08/30/2010 FINDINGS: Lower chest: Mild atelectasis or scarring at the right lung base. Hepatobiliary: Liver parenchyma appears normal without contrast. Previous cholecystectomy. No sign of ductal dilatation given that. Pancreas: Normal Spleen: Normal Adrenals/Urinary Tract: Adrenal glands are normal. Kidneys are normal. Bladder is normal. Stomach/Bowel: Stomach is fluid-filled. Numerous dilated fluid and air-filled loops of small intestine with transition zone in the ileum consistent with small bowel obstruction. Specific etiology is not identified. No abnormal colon finding. Vascular/Lymphatic: Aortic atherosclerosis, mild. No aneurysm. IVC is normal. No adenopathy. Reproductive: No adnexal mass.  Small uterine lipoma. Other: No free fluid or air. Musculoskeletal: Ordinary mild spinal degenerative changes. IMPRESSION: 1. Small-bowel obstruction with transition zone in the ileum. Specific etiology is not identified. 2. Aortic atherosclerosis. Aortic Atherosclerosis (ICD10-I70.0). Electronically Signed   By: Nelson Chimes M.D.   On: 04/26/2022 18:32    Microbiology: Results for orders placed or performed during the hospital encounter of 04/26/22  Culture, blood (Routine X 2) w Reflex to ID Panel     Status: None   Collection Time: 04/30/22  4:18 AM   Specimen: BLOOD LEFT ARM  Result Value Ref Range Status   Specimen Description BLOOD LEFT ARM  Final   Special Requests   Final    BOTTLES DRAWN AEROBIC AND ANAEROBIC Blood Culture adequate volume   Culture   Final    NO GROWTH 5 DAYS Performed at [facility], [address] [place], [place] [address]    Report Status 05/05/2022 FINAL  Final  Culture, blood (Routine X 2) w Reflex to ID Panel     Status: None   Collection Time:  04/30/22  5:04 AM   Specimen: BLOOD LEFT ARM  Result Value Ref Range Status   Specimen Description BLOOD LEFT ARM  Final   Special Requests   Final    BOTTLES DRAWN AEROBIC AND ANAEROBIC Blood Culture adequate volume   Culture   Final    NO GROWTH 5 DAYS Performed at [facility], [address] [place], [place] [address]    Report Status 05/05/2022 FINAL  Final  Urine Culture     Status: Abnormal   Collection Time: 04/30/22 10:02 AM   Specimen: Urine, Clean Catch  Result Value Ref Range Status   Specimen Description   Final    URINE, CLEAN CATCH Performed at [facility], [address], [place], [place] [address]    Special Requests   Final    NONE Performed at [facility], [address] [place], [place] [address]    Culture 40,000 COLONIES/mL ENTEROCOCCUS FAECIUM (A)  Final   Report Status 05/02/2022 FINAL  Final   Organism ID, Bacteria ENTEROCOCCUS FAECIUM (A)  Final      Susceptibility   Enterococcus faecium - MIC*    AMPICILLIN <=2 SENSITIVE Sensitive     NITROFURANTOIN 64 INTERMEDIATE Intermediate     VANCOMYCIN <=0.5 SENSITIVE Sensitive     * 40,000 COLONIES/mL ENTEROCOCCUS FAECIUM    Labs: CBC: Recent Labs  Lab 05/12/22 0818 05/13/22 0508  WBC 10.8* 9.9  HGB 9.7* 8.7*  HCT 31.5* 28.5*  MCV 79.9* 81.0  PLT 292 010   Basic Metabolic Panel: Recent Labs  Lab 05/08/22 0708 05/09/22 0621 05/10/22 0551 05/11/22 0443 05/12/22 0818 05/13/22 0508 05/14/22 0722  NA 140 139 135 134* 134* 136 133*  K 4.7 4.4 4.3 4.1 3.9 3.8 4.0  CL 108  104 101 99 100 99 98  CO2 '26 25 27 28 26 28 26  '$ GLUCOSE 126* 106* 100* 107* 109* 114* 107*  BUN 53* 52* 52* 48* 44* 40* 36*  CREATININE 1.35* 1.56* 1.90* 2.00* 2.01* 2.20* 2.01*  CALCIUM 8.6* 8.7* 8.7* 8.5* 8.6* 8.6* 8.9  MG 2.3 1.9 1.7 2.0  --  1.8  --   PHOS 4.1 3.8 3.4 3.4  --   --   --    Liver Function Tests: Recent Labs  Lab 05/09/22 0621  AST 12*  ALT 11  ALKPHOS 58  BILITOT  0.8  PROT 6.4*  ALBUMIN 2.8*   CBG: Recent Labs  Lab 05/07/22 2355 05/08/22 0512 05/08/22 0747 05/08/22 1133 05/08/22 1628  GLUCAP 128* 101* 118* 132* 114*    Discharge time spent: greater than 30 minutes.  Signed: Sharen Hones, MD [facility] 05/14/2022

## 2022-05-14 NOTE — Plan of Care (Signed)

## 2022-05-14 NOTE — Telephone Encounter (Signed)
Still admitted.  Will continue to follow up.

## 2022-05-14 NOTE — TOC Transition Note (Signed)
Transition of Care [facility]) - CM/SW Discharge Note   Patient Details  Name: Samantha Mason MRN: [id number] Date of Birth: 26-Sep-1950  [facility] CM/SW Contact:  Gerilyn Pilgrim, LCSW Phone Number: 05/14/2022, 2:34 PM   Clinical Narrative:   Pt discharging today. [facility] selected as [place] agency. Corene Cornea notified of discharge. No additional needs at this time. CSW signing off.    Final next level of care: Home w Home Health Services Barriers to Discharge: Barriers Resolved   Patient Goals and CMS Choice CMS Medicare.gov Compare Post Acute Care list provided to:: Patient Choice offered to / list presented to : Patient  Discharge Placement                  Patient to be transferred to facility by: son Name of family member notified: patient notified Patient and family notified of of transfer: 05/11/22  Discharge Plan and Services Additional resources added to the After Visit Summary for                  DME Arranged: Hospital bed DME Agency: [employer] Date DME Agency Contacted: 05/11/22   Representative spoke with at DME Agency: [place]: RN [place] Agency: [place] ([place]) Date [place]: 05/14/22 Time [place]: [place] Representative spoke with at [place]: [place] ([place]) Interventions [place]: No Food Insecurity (04/27/2022)  Housing: Low Risk  (04/27/2022)  Transportation Needs: No Transportation Needs (04/27/2022)  Utilities: Not At Risk (04/27/2022)  Alcohol Screen: Low Risk  (08/08/2021)  Depression (PHQ2-9): Medium Risk (02/25/2022)  Financial Resource Strain: Medium Risk (08/14/2021)  Physical Activity: Inactive (08/08/2021)  Social Connections: Socially Isolated (08/08/2021)  Stress: No Stress Concern Present (08/08/2021)  Tobacco Use: Low Risk  (05/08/2022)     Readmission Risk Interventions     No data to display

## 2022-05-14 NOTE — Therapy (Signed)
RN gave patient a PRN treatment for shortness of breath.  Follow up assessment:  Pt in the chair in no distress.  States she does not feel as tight.  O2 on standby on 2 lpm [place].  Pt did not feel like she needed O2 at this time.

## 2022-05-15 NOTE — Telephone Encounter (Signed)
Spoke to patient and relayed below message/recommendations. She is questioning if she can try a cpap vs bipap.  Dr. Mortimer Fries, please advise. Thanks

## 2022-05-16 ENCOUNTER — Ambulatory Visit: Payer: Self-pay

## 2022-05-16 ENCOUNTER — Other Ambulatory Visit: Payer: Self-pay | Admitting: Internal Medicine

## 2022-05-16 NOTE — Telephone Encounter (Signed)
Medication Refill - Medication: oxyCODONE 7.5 MG TABS   Has the patient contacted their pharmacy? yes (Agent: If no, request that the patient contact the pharmacy for the refill. If patient does not wish to contact the pharmacy document the reason why and proceed with request.) (Agent: If yes, when and what did the pharmacy advise?)contact pcp  Preferred Pharmacy (with phone number or street name):  [id number] [place]: [phone] Fax: [phone]    Has the patient been seen for an appointment in the last year OR does the patient have an upcoming appointment? yes  Agent: Please be advised that RX refills may take up to 3 business days. We ask that you follow-up with your pharmacy.

## 2022-05-16 NOTE — Telephone Encounter (Signed)
[facility] and spoke to [place], [facility] about the oxycodone 7.'5mg'$  requested. Advised it was sent on 05/10/22 #30/0 refill(s). She states that the sent in brand name of oxycodone that do not have it or the dosage and its not covered under pt insurance, requesting Dr. Army Melia to send in alternative for '5mg'$  Oxycodone. Advised her I would send message to Dr. Thomasenia Bottoms for review. Called pt to inform her of this as well and left detailed VM explaining situation and if pt had further questions to CB.   Summary: pt states just discharged yesterday, did not get this med   oxyCODONE 7.5 MG TABS 30 tablet Pt states was just discharged yesterday from hospital and not 12/21 and [facility] not [facility] this med and pt wants it called in and states can not get a hold of discharging dr?  States she is really starting to need a pain med. Call pt to advise. [phone]

## 2022-05-16 NOTE — Telephone Encounter (Signed)
Please review.  KP

## 2022-05-17 ENCOUNTER — Inpatient Hospital Stay: Payer: Medicare Other | Admitting: Internal Medicine

## 2022-05-17 ENCOUNTER — Telehealth: Payer: Self-pay | Admitting: *Deleted

## 2022-05-17 ENCOUNTER — Ambulatory Visit: Payer: Self-pay

## 2022-05-17 NOTE — Patient Outreach (Signed)
  Care Coordination [facility] Note Transition Care Management Unsuccessful Follow-up Telephone Call  Date of discharge and from where:  [facility] [phone]  Attempts:  1st Attempt  Reason for unsuccessful TCM follow-up call:  Left voice message  [place] Management [phone]

## 2022-05-17 NOTE — Telephone Encounter (Signed)
Danae Chen, RN from [facility] calls and says the patient was discharged from the hospital, was not able to have bowel surgery and patient will be going to [facility] for evaluation. She says the patient was started on zofran and reglan for nausea and pantoprazole. She says when the patient went to pick up her medications on 05/14/22, the oxycodone was not at the pharmacy. Danae Chen asks who will take care of that Rx. I advised patient will need a f/u in order for that to be addressed. Advised all medications are on her med list. She says the Trazodone the patient says she takes, but it was discontinued at discharge. Advised to reach out to cardiology Dr. Conni Slipper about that since he was the one who manages that medications, she verbalized understanding. She says the patient reported the HFU was cancelled by the office today. I advised per notes it was cancelled by her today and that she will need to call back to schedule with Dr. Army Melia. She says she will let the patient know to call.   Summary: Med Management   Danae Chen from [facility] stated the patient was admitted yesterday afternoon and needs to speak with a nurse regarding the patient's medication list.    Seeking clinical advice.     Reason for Disposition  Health Information question, no triage required and triager able to answer question  Answer Assessment - Initial Assessment Questions 1. REASON FOR CALL or QUESTION: "What is your reason for calling today?" or "How can I best help you?" or "What question do you have that I can help answer?"     Patient discharged and some medications added to her list  Protocols used: Information Only Call - No Triage-A-AH

## 2022-05-17 NOTE — Telephone Encounter (Signed)
Patient informed and said okay.  Samantha Mason

## 2022-05-18 NOTE — Telephone Encounter (Signed)
Requested medication (s) are due for refill today: no  Requested medication (s) are on the active medication list: yes  Last refill:  05/10/22  Future visit scheduled: yes  Notes to clinic:  Unable to refill per protocol, last refill by another provider 05/10/22 for 30 days. Unable to refuse duplicate request.     Requested Prescriptions  Pending Prescriptions Disp Refills   oxyCODONE HCl 7.5 MG TABS 30 tablet 0    Sig: Take 7.5 mg by mouth every 6 (six) hours as needed.     Not Delegated - Analgesics:  Opioid Agonists Failed - 05/16/2022  5:18 PM      Failed - This refill cannot be delegated      Failed - Urine Drug Screen completed in last 360 days      Passed - Valid encounter within last 3 months    Recent Outpatient Visits           2 months ago Acute non-recurrent maxillary sinusitis   [facility], Jesse Sans, MD   3 months ago Acute on chronic heart failure with preserved ejection fraction (HFpEF) [facility])   [place] [facility] at [facility], Jesse Sans, MD   8 months ago Chronic heart failure with preserved ejection fraction (HFpEF) [facility])   [place] [facility] at [facility], Jesse Sans, MD   1 year ago Essential hypertension   [place] [facility], Jesse Sans, MD   1 year ago Asthma in adult, moderate persistent, with acute exacerbation   [place] [facility] at [facility], Jesse Sans, MD       Future Appointments             In 3 weeks Army Melia Jesse Sans, MD [place] [facility] at [facility], [facility]

## 2022-05-21 NOTE — Telephone Encounter (Signed)
Signed 1 week ago. Please refuse.  KP

## 2022-05-22 ENCOUNTER — Telehealth: Payer: Self-pay

## 2022-05-22 NOTE — Telephone Encounter (Signed)
Patient is aware that Dr. Mortimer Fries is out of the office until 05/27/2022. She voiced her understanding.

## 2022-05-22 NOTE — Patient Outreach (Signed)
  Care Coordination TOC Note Transition Care Management Unsuccessful Follow-up Telephone Call  Date of discharge and from where:  05/14/22-[facility]  Attempts:  2nd Attempt  Reason for unsuccessful TCM follow-up call:  No answer/busy     Enzo Montgomery, RN,BSN,CCM [place] Management Telephonic Care Management Coordinator Direct Phone: [phone] Toll Free: [phone] Fax: [phone]

## 2022-05-23 ENCOUNTER — Telehealth: Payer: Self-pay

## 2022-05-23 NOTE — Telephone Encounter (Signed)
Patient is aware of below message and voiced her understanding.  CPAP titration has been ordered. Nothing further needed.

## 2022-05-23 NOTE — Patient Outreach (Signed)
  Care Coordination TOC Note Transition Care Management Unsuccessful Follow-up Telephone Call  Date of discharge and from where:  05/14/22-[facility] ED  Attempts:  3rd Attempt  Reason for unsuccessful TCM follow-up call:  Unable to reach patient   [place] Management Telephonic Care Management Coordinator Direct Phone: [phone] Toll Free: [phone] Fax: [phone]

## 2022-05-27 ENCOUNTER — Telehealth: Payer: Self-pay | Admitting: Pulmonary Disease

## 2022-05-27 ENCOUNTER — Telehealth: Payer: Self-pay | Admitting: Internal Medicine

## 2022-05-27 ENCOUNTER — Ambulatory Visit: Payer: Medicare Other | Admitting: Gastroenterology

## 2022-05-27 NOTE — Telephone Encounter (Signed)
Patient is aware of below message/recommendations and voiced her understanding.  Nothing further needed.  

## 2022-05-27 NOTE — Telephone Encounter (Signed)
I spoke with the patient in an alternate encounter.  She is scheduled to come in for an appointment tomorrow at 2:45 pm with Cadence Kathlen Mody, [place].  Will discuss her refill / dosing at that time.

## 2022-05-27 NOTE — Telephone Encounter (Signed)
Pt c/o medication issue:  1. Name of Medication:  torsemide (DEMADEX) 20 MG tablet  2. How are you currently taking this medication (dosage and times per day)? no  3. Are you having a reaction (difficulty breathing--STAT)? no  4. What is your medication issue? Patient was calling to get  refill. But its not listed under her current medication. Please advise

## 2022-05-27 NOTE — Telephone Encounter (Signed)
Called and spoke to patient.  She reports of an 8lb weight gain over night. I have recommend that she contact cardiology.  C/o increased SOB with exertion and at rest, non prod cough and wheezing x1w  She is doing neb treatments Q4h and pulmicort BID.  No OTC meds to help with sx. She wears 2L cont. Spo2 maintaining around 94%.  Dr. Mortimer Fries, please advise. Thanks.

## 2022-05-27 NOTE — Telephone Encounter (Signed)
The patient called back to the office.  She advised that she has had an 8 lb weight gain overnight.  Her feet/ legs are swollen. They are red and oozing per her report.  Swelling started on Friday or Saturday.   She is currently taking torsemide 40 mg once daily & metolazone 2.5 mg on Mondays & Fridays.   She ran out of her Phineas Real about a week ago and this is currently on backorder at her pharmacy.   The patient states she is currently having to use her oxygen all the time, but she is able to speak in full sentences while on the phone with me.   She denies any recent medication, dietary, or oral intake changes.  She has not missed any doses of torsemide.   Torsemide has currently fallen off her medication list, so I will add this back today.   I have advised the patient that due to her increased weight gain and swelling, I do think she requires an in office evaluation.  She states her son drives her and can't bring her in until after 2:00 pm for an appointment. I have offered her an appointment with Cadence Kathlen Mody, PA tomorrow at 2:45 pm. The patient is aware is and agreeable. I have also reached out to the CHF clinic to see if they have any openings tomorrow afternoon, but am awaiting a response.  We will plan to see the patient tomorrow in follow up, however she has been advised of ED precautions for any increased weight gain or worsening symptoms.   The patient voices understanding and is agreeable.

## 2022-05-27 NOTE — Telephone Encounter (Signed)
Pt c/o swelling: STAT is pt has developed SOB within 24 hours  If swelling, where is the swelling located? Hands   How much weight have you gained and in what time span? 8 lbs in weight gain over night   Have you gained 3 pounds in a day or 5 pounds in a week? Yes   Do you have a log of your daily weights (if so, list)?  05/26/22 328 lbs 05/27/22 336 lbs  Are you currently taking a fluid pill? Yes  Are you currently SOB? Yes, but has had for four days   Have you traveled recently? No

## 2022-05-27 NOTE — Telephone Encounter (Signed)
Attempted to call the patient. No answer- I left a message to please call back.  

## 2022-05-27 NOTE — Progress Notes (Deleted)
Gastroenterology Consultation  Referring Provider:     Glean Hess, MD Primary Care Physician:  Glean Hess, MD Primary Gastroenterologist:  Dr. Allen Norris     Reason for Consultation:     Small bowel obstruction        HPI:   Samantha Mason is a 72 y.o. y/o female referred for consultation & management of small bowel obstruction by Dr. Army Melia, Jesse Sans, MD. this is comes in today after being seen by me in the past and 2019 for colonoscopy.  At that time the patient had a adenomatous polyp and was recommended to have repeat colonoscopy in 5 years.  The patient was admitted to the hospital with abdominal pain nausea vomiting back in December 2023.  At that time the patient had a prolonged hospital stay with a small bowel obstruction and followed by surgery.  The patient had refused an NG tube and imaging showed a transition zone in the ileum.  The patient was awaiting a bed at [facility] for further management but improved and was sent home.  The patient had multiple imagings of her bowel during the hospital stay with a suggestion that adhesions may have been the cause of her symptoms.  Past Medical History:  Diagnosis Date   Acute bronchitis    Anemia    Arthritis    Asthma    B12 deficiency 03/14/2017   Chest pain 07/02/2018   Chest pain    a. 06/2018 MV: EF 66%. No ischemia/infarct; b. 02/2021 PET CT: EF>60%, no ischemia/infarct.   Chronic heart failure with preserved ejection fraction (HFpEF) ([place])    a. 08/2020 Echo: EF 55-60%, GrI DD; b. 06/2021 Echo: EF 55-60%, mild LVH, nl RV fxn, no signif valvular dzs.   COPD (chronic obstructive pulmonary disease) ([place]) 07/24/2018   Family history of blood clots 07/16/2017   Irritable bowel syndrome    Malignant hypertension    Obesity    OSA (obstructive sleep apnea)    uses cpap   Pneumonia 2017   Restless leg syndrome    Stroke [facility]) 1995   mild    Past Surgical History:  Procedure Laterality Date   CHOLECYSTECTOMY      COLONOSCOPY WITH PROPOFOL N/A 03/31/2018   Procedure: COLONOSCOPY WITH PROPOFOL;  Surgeon: Lucilla Lame, MD;  Location: [facility] ENDOSCOPY;  Service: Endoscopy;  Laterality: N/A;   KNEE ARTHROPLASTY Right 04/30/2019   Procedure: COMPUTER ASSISTED TOTAL KNEE ARTHROPLASTY;  Surgeon: Dereck Leep, MD;  Location: [facility] ORS;  Service: Orthopedics;  Laterality: Right;   RIGHT HEART CATH N/A 01/30/2022   Procedure: RIGHT HEART CATH;  Surgeon: Nelva Bush, MD;  Location: [place] [facility];  Service: Cardiovascular;  Laterality: N/A;   saliva gland removal  1998   TOTAL KNEE ARTHROPLASTY Left 2014   TUBAL LIGATION Bilateral     Prior to Admission medications   Medication Sig Start Date End Date Taking? Authorizing Provider  acetaminophen (TYLENOL) 500 MG tablet Take 1,000 mg by mouth every 6 (six) hours as needed.    [provider]  benzonatate (TESSALON) 100 MG capsule TAKE 1 CAPSULE BY MOUTH THREE TIMES A DAY AS NEEDED FOR COUGH 03/08/22   Tyler Pita, MD  budesonide (PULMICORT) 0.5 MG/2ML nebulizer solution TAKE 2 ML (0.5 MG TOTAL) BY NEBULIZATION TWICE A DAY 10/29/21   Tyler Pita, MD  calcitRIOL (ROCALTROL) 0.25 MCG capsule Take 0.25 mcg by mouth daily. 02/22/22   [provider]  dapagliflozin propanediol [facility])  10 MG TABS tablet Take 1 tablet (10 mg total) by mouth daily before breakfast. Patient not taking: Reported on 04/27/2022 03/07/22   Alisa Graff, FNP  diltiazem (CARDIZEM CD) 240 MG 24 hr capsule Take 1 capsule (240 mg total) by mouth daily. 09/05/21   Theora Gianotti, NP  feeding supplement (ENSURE ENLIVE / ENSURE PLUS) LIQD Take 237 mLs by mouth 3 (three) times daily between meals. 05/10/22   Lorella Nimrod, MD  ipratropium-albuterol (DUONEB) 0.5-2.5 (3) MG/3ML SOLN TAKE 3 MLS BY NEBULIZATION EVERY 6 (SIX) HOURS AS NEEDED. J45.40 03/04/22   Tyler Pita, MD  metoCLOPramide (REGLAN) 5 MG tablet Take 1 tablet (5 mg total) by mouth 3  (three) times daily before meals. 05/12/22   Sharen Hones, MD  metolazone (ZAROXOLYN) 2.5 MG tablet Take 1 tablet (2.5 mg total) by mouth 2 (two) times a week. 02/04/22   Wouk, Ailene Rud, MD  NON FORMULARY Bipap - nightly with Oxygen 2 Lpm    [provider]  ondansetron (ZOFRAN) 4 MG tablet Take 1 tablet (4 mg total) by mouth daily as needed for nausea or vomiting. 05/10/22 05/10/23  Lorella Nimrod, MD  oxyCODONE 7.5 MG TABS Take 7.5 mg by mouth every 6 (six) hours as needed. 05/10/22 05/10/23  Lorella Nimrod, MD  OXYGEN Inhale into the lungs. 2 LPM [place]    [provider]  pantoprazole (PROTONIX) 40 MG tablet TAKE 1 TABLET BY MOUTH EVERY DAY 12/11/21   Glean Hess, MD  PREVALITE 4 g packet DISSOLVE AND DRINK 1 PACKET BY MOUTH AT BEDTIME 03/05/22   Glean Hess, MD  promethazine (PHENERGAN) 25 MG tablet Take 1 tablet (25 mg total) by mouth every 6 (six) hours as needed. 05/10/22 05/10/23  Lorella Nimrod, MD  rOPINIRole (REQUIP) 1 MG tablet Take 1 tablet (1 mg total) by mouth 3 (three) times daily. 02/13/22   Glean Hess, MD    Family History  Problem Relation Age of Onset   Dementia Mother    COPD Father        59   Diabetes Father    Heart failure Brother    Coronary artery disease Brother 54       CABG   Heart failure Maternal Grandmother    Stroke Maternal Grandmother    Breast cancer Neg Hx      Social History   Tobacco Use   Smoking status: Never   Smokeless tobacco: Never  Vaping Use   Vaping Use: Never used  Substance Use Topics   Alcohol use: No    Alcohol/week: 0.0 standard drinks of alcohol   Drug use: No    Allergies as of 05/27/2022 - Review Complete 05/08/2022  Allergen Reaction Noted   Nuvigil [armodafinil] Hives 02/15/2019   Penicillins Diarrhea and Nausea And Vomiting 03/29/2014   Spironolactone Itching 01/09/2022   Gabapentin Other (See Comments) 05/07/2019   Melatonin Other (See Comments) 10/08/2021   Provigil  [modafinil] Hives 02/10/2020   Entresto [sacubitril-valsartan] Itching 09/03/2021   Lisinopril Cough 08/06/2021    Review of Systems:    All systems reviewed and negative except where noted in HPI.   Physical Exam:  There were no vitals taken for this visit. No LMP recorded. Patient is postmenopausal. General:   Alert,  Well-developed, well-nourished, pleasant and cooperative in NAD Head:  Normocephalic and atraumatic. Eyes:  Sclera clear, no icterus.   Conjunctiva pink. Ears:  Normal auditory acuity. Neck:  Supple; no masses or thyromegaly. Lungs:  Respirations even and unlabored.  Clear throughout to auscultation.   No wheezes, crackles, or rhonchi. No acute distress. Heart:  Regular rate and rhythm; no murmurs, clicks, rubs, or gallops. Abdomen:  Normal bowel sounds.  No bruits.  Soft, non-tender and non-distended without masses, hepatosplenomegaly or hernias noted.  No guarding or rebound tenderness.  Negative Carnett sign.   Rectal:  Deferred.  Pulses:  Normal pulses noted. Extremities:  No clubbing or edema.  No cyanosis. Neurologic:  Alert and oriented x3;  grossly normal neurologically. Skin:  Intact without significant lesions or rashes.  No jaundice. Lymph Nodes:  No significant cervical adenopathy. Psych:  Alert and cooperative. Normal mood and affect.  Imaging Studies: DG Abd 1 View  Result Date: 05/14/2022 CLINICAL DATA:  Small bowel obstruction EXAM: ABDOMEN - 1 VIEW COMPARISON:  05/12/2022 FINDINGS: No bowel dilatation to suggest obstruction. No evidence of pneumoperitoneum, portal venous gas or pneumatosis. No pathologic calcifications along the expected course of the ureters. No acute osseous abnormality. IMPRESSION: 1. Nonobstructive bowel gas pattern. Electronically Signed   By: Kathreen Devoid M.D.   On: 05/14/2022 11:06   DG Abd 1 View  Result Date: 05/12/2022 CLINICAL DATA:  Small-bowel obstruction EXAM: ABDOMEN - 1 VIEW COMPARISON:  Radiograph 05/06/2022  FINDINGS: Persistent small bowel dilation, not significantly changed from prior. Normal caliber colon. Right upper quadrant surgical clips. Degenerative changes of the spine. IMPRESSION: Persistent small bowel obstruction. Electronically Signed   By: Maurine Simmering M.D.   On: 05/12/2022 08:15   DG Abd 1 View  Result Date: 05/06/2022 CLINICAL DATA:  Small bowel obstruction.[id number]. EXAM: ABDOMEN - 1 VIEW COMPARISON:  05/03/2022 FINDINGS: There is persistent dilatation of small bowel loops in the central abdomen, similar in appearance to prior study. Relative normal appearance of the large bowel loops. Surgical clips are noted in the RIGHT UPPER QUADRANT of the abdomen. No evidence for free intraperitoneal air. IMPRESSION: Persistent dilatation of small bowel loops in the central abdomen compatible with small-bowel obstruction. Electronically Signed   By: Nolon Nations M.D.   On: 05/06/2022 09:00   [place] EKG SITE RITE  Result Date: 05/03/2022 If Site Rite image not attached, placement could not be confirmed due to current cardiac rhythm.  DG Abd 1 View  Result Date: 05/03/2022 CLINICAL DATA:  Abdominal pain EXAM: ABDOMEN - 1 VIEW COMPARISON:  Radiograph 05/01/2022, CT 05/01/2022 FINDINGS: Persistent small bowel dilation with decompressed colon, slightly improved from prior. Some residual contrast material is noted in the distal colon. IMPRESSION: Persistent small bowel dilation consistent with obstruction, slightly improved from prior. Electronically Signed   By: Maurine Simmering M.D.   On: 05/03/2022 08:39   DG Abd 1 View  Result Date: 05/01/2022 CLINICAL DATA:  Ileus, generalized abdominal pain. EXAM: ABDOMEN - 1 VIEW COMPARISON:  CT abdomen pelvis 05/01/2022 and abdominal radiographs 04/29/2022. FINDINGS: Persistent small bowel dilatation. Oral contrast is seen in nondilated colon, as on 05/01/2022. IMPRESSION: Small bowel obstruction. Electronically Signed   By: Lorin Picket M.D.   On: 05/01/2022  09:08   CT ABDOMEN PELVIS WO CONTRAST  Result Date: 05/01/2022 CLINICAL DATA:  Abdominal pain.  Small-bowel obstruction.  Sepsis. EXAM: CT ABDOMEN AND PELVIS WITHOUT CONTRAST TECHNIQUE: Multidetector CT imaging of the abdomen and pelvis was performed following the standard protocol without IV contrast. RADIATION DOSE REDUCTION: This exam was performed according to the departmental dose-optimization program which includes automated exposure control, adjustment of the mA and/or kV according to patient size and/or use of iterative  reconstruction technique. COMPARISON:  04/26/2022 FINDINGS: Lower chest: No acute findings. Hepatobiliary: No mass visualized on this unenhanced exam. Prior cholecystectomy. No evidence of biliary obstruction. Pancreas: No mass or inflammatory process visualized on this unenhanced exam. Spleen:  Within normal limits in size. Adrenals/Urinary tract: No evidence of urolithiasis or hydronephrosis. Unremarkable unopacified urinary bladder. Stomach/Bowel: Moderately dilated fluid-filled small bowel loops have increased since previous study. Transition point is seen in the anterior mid abdomen, likely due to adhesion. No mass or inflammatory process identified. No abnormal fluid collections seen. Vascular/Lymphatic: No pathologically enlarged lymph nodes identified. No evidence of abdominal aortic aneurysm. Reproductive: Stable 2 cm fat attenuation mass in the uterus, consistent with a benign lipoleiomyoma. Adnexal regions are unremarkable. Other:  None. Musculoskeletal: No suspicious bone lesions identified. Incidental note is again made of Paget's disease involving the left hip. IMPRESSION: Worsening small bowel obstruction with transition point in the anterior mid abdomen, likely due to adhesion. No evidence of inflammatory process or abscess. Stable small uterine lipoleiomyoma. Electronically Signed   By: Marlaine Hind M.D.   On: 05/01/2022 09:03   DG Chest Port 1 View  Result Date:  04/30/2022 CLINICAL DATA:  Sepsis EXAM: PORTABLE CHEST 1 VIEW COMPARISON:  01/23/2022 FINDINGS: Streaky density and right diaphragm elevation that is chronic based on priors. No edema or acute opacity. No effusion or pneumothorax. Normal heart size and mediastinal contours. IMPRESSION: Chronic elevation of the right diaphragm with overlying lower lobe scarring. No edema or convincing pneumonia when compared to priors. Electronically Signed   By: Jorje Guild M.D.   On: 04/30/2022 04:34   CT HEAD WO CONTRAST (5MM)  Result Date: 04/30/2022 CLINICAL DATA:  Altered mental status EXAM: CT HEAD WITHOUT CONTRAST TECHNIQUE: Contiguous axial images were obtained from the base of the skull through the vertex without intravenous contrast. RADIATION DOSE REDUCTION: This exam was performed according to the departmental dose-optimization program which includes automated exposure control, adjustment of the mA and/or kV according to patient size and/or use of iterative reconstruction technique. COMPARISON:  01/11/2019 FINDINGS: Brain: There is no mass, hemorrhage or extra-axial collection. The size and configuration of the ventricles and extra-axial CSF spaces are normal. There is hypoattenuation of the white matter, most commonly indicating chronic small vessel disease. Vascular: No abnormal hyperdensity of the major intracranial arteries or dural venous sinuses. No intracranial atherosclerosis. Skull: The visualized skull base, calvarium and extracranial soft tissues are normal. Sinuses/Orbits: No fluid levels or advanced mucosal thickening of the visualized paranasal sinuses. No mastoid or middle ear effusion. The orbits are normal. IMPRESSION: 1. No acute intracranial abnormality. 2. Chronic small vessel disease. Electronically Signed   By: Ulyses Jarred M.D.   On: 04/30/2022 01:48   DG Abd 1 View  Result Date: 04/29/2022 CLINICAL DATA:  Small bowel obstruction. EXAM: ABDOMEN - 1 VIEW COMPARISON:  April 27, 2022. FINDINGS: Continued small bowel dilatation is noted which may be slightly decreased compared to prior exam. Residual contrast is seen throughout the nondilated colon. IMPRESSION: Continued small bowel dilatation is noted which may be slightly decreased compared to prior exam, although residual contrast is noted throughout nondilated colon. Electronically Signed   By: Marijo Conception M.D.   On: 04/29/2022 08:11   US RENAL  Result Date: 04/28/2022 CLINICAL DATA:  Acute kidney injury. EXAM: RENAL / URINARY TRACT ULTRASOUND COMPLETE COMPARISON:  CT, 04/26/2022. FINDINGS: Right Kidney: Renal measurements: 9.2 x 4.6 x 4 cm = volume: 88 mL. Diffuse cortical thinning. Mild increased parenchymal  echogenicity. No mass, stone or hydronephrosis. Left Kidney: Renal measurements: 10.4 x 4.6 x 3.3 cm = volume: 82 mL. Diffuse cortical thinning. Mild increased parenchymal echogenicity. No mass, stone or hydronephrosis. Bladder: Not visualized. Other: None. IMPRESSION: 1. Limited study due to the patient's body habitus. 2. Findings consistent with medical renal disease with bilateral renal cortical thinning and increased renal parenchymal echogenicity. No renal mass. No hydronephrosis. Electronically Signed   By: Lajean Manes M.D.   On: 04/28/2022 09:36   DG Abd Portable 1V-Small Bowel Obstruction Protocol-initial, 8 hr delay  Result Date: 04/27/2022 CLINICAL DATA:  Small-bowel obstruction, 8 hour follow-up film EXAM: PORTABLE ABDOMEN - 1 VIEW COMPARISON:  CT from the previous day. FINDINGS: Scattered large and small bowel gas is noted. Persistent small bowel dilatation is noted proximally. Administered contrast lies predominately within the stomach although some contrast is noted within the small bowel. This extends distally into the ileum beyond the transition zone seen on prior CT examination. Minimal contrast is noted within the right colon. No free air is seen. No other focal abnormality is noted. IMPRESSION:  Administered contrast has reached the right colon although persistent small bowel dilatation remains. Electronically Signed   By: Inez Catalina M.D.   On: 04/27/2022 20:00    Assessment and Plan:   Samantha Mason is a 72 y.o. y/o female ***    Lucilla Lame, MD. Mar[facility] Regal    Note: This dictation was prepared with Dragon dictation along with smaller phrase technology. Any transcriptional errors that result from this process are unintentional.

## 2022-05-28 ENCOUNTER — Ambulatory Visit: Payer: Medicare Other | Attending: Medical | Admitting: Medical

## 2022-05-28 ENCOUNTER — Encounter: Payer: Self-pay | Admitting: Medical

## 2022-05-28 VITALS — BP 126/50 | HR 105 | Ht 64.0 in | Wt 338.5 lb

## 2022-05-28 DIAGNOSIS — J9611 Chronic respiratory failure with hypoxia: Secondary | ICD-10-CM | POA: Insufficient documentation

## 2022-05-28 DIAGNOSIS — J9612 Chronic respiratory failure with hypercapnia: Secondary | ICD-10-CM | POA: Diagnosis present

## 2022-05-28 DIAGNOSIS — I10 Essential (primary) hypertension: Secondary | ICD-10-CM | POA: Diagnosis not present

## 2022-05-28 DIAGNOSIS — N183 Chronic kidney disease, stage 3 unspecified: Secondary | ICD-10-CM | POA: Insufficient documentation

## 2022-05-28 DIAGNOSIS — I5033 Acute on chronic diastolic (congestive) heart failure: Secondary | ICD-10-CM | POA: Diagnosis not present

## 2022-05-28 DIAGNOSIS — J449 Chronic obstructive pulmonary disease, unspecified: Secondary | ICD-10-CM | POA: Diagnosis present

## 2022-05-28 DIAGNOSIS — R000 Tachycardia, unspecified: Secondary | ICD-10-CM | POA: Diagnosis not present

## 2022-05-28 NOTE — Progress Notes (Signed)
Cardiology Office Note:    Date:  05/28/2022   ID:  Samantha Mason, DOB Jan 16, 1951, MRN [id number]  PCP:  Glean Hess, MD  [facility] Cardiologist:  Nelva Bush, MD  [facility] Electrophysiologist:  None   Referring MD: Glean Hess, MD   Chief Complaint: hospital follow-up  History of Present Illness:    Samantha Mason is a 72 y.o. female with a hx of chronic HFpEF, hypertension, asthma, stroke (1995), morbid obesity, and obstructive sleep apnea, who presents for follow-up today related to heart failure.    In the setting of atypical chest pain, she previously underwent stress testing in February 2020, which was low risk with normal LV function.  She was hospitalized in April 2022 with dyspnea felt to be secondary to asthma exacerbation.  Echocardiogram showed an EF of 55 to 60% with grade 1 diastolic dysfunction.  Following discharge, she re-established care in our office in June 2022 in the setting of ongoing dyspnea and volume overload, at which time she was hypertensive and her diltiazem therapy was titrated while Lasix 40 mg daily was added.  Volume status was noted to be improved at follow-up in August 2022.  She was admitted with asthma and COPD exacerbation October 2022 and subsequently underwent PET/CT at [facility] in early October 2022, which showed no evidence of ischemia.   Samantha Mason was readmitted to [facility] in late February 2023 in the setting of dyspnea and acute on chronic HFpEF with 37 pound weight gain prior to admission.  She underwent aggressive IV diuresis with 31 pound weight loss during admission and she was discharged home at 328 pounds on March 10, on torsemide 40 mg daily.  Echocardiogram during admission showed an EF of 55 to 60% without any significant valvular disease.  She subsequently followed up in heart failure clinic on March 20, at which time she was noted to have 1+ bilateral lower extremity edema and weight was stable compared to  discharge.  She was felt to be euvolemic.  Jardiance therapy was added.  She was subsequently placed on Entresto by the provider at [facility].  She developed severe itching with this and it was discontinued.  She had follow-up lab work early last week, notable for a potassium 4.8, BUN of 70, and creatinine of 2.17.    She was admitted in September 2023 with acute on chronic respiratory failure with hypoxia and hypercapnia, acute on chronic diastolic heart failure, CKD stage III, chronic bronchitis, hypertension.  In patient was sent to the ER from her pulmonologist office for worsening shortness of breath for 2 days.  O2 was 88% she required supplemental oxygen.  She was treated with IV Lasix drip and later transition to home torsemide with metolazone.  She underwent a right heart cath showing borderline elevated left heart filling pressures, severely elevated right heart filling pressures, mild pulmonary hypertension, normal to supranormal cardiac output.  Hemodynamics were not consistent with cardiogenic pulmonary edema driving hypoxic respiratory failure.  Gentle diuresis was continued.  Patient was admitted 12/8-12/6 for SBO treated conservatively. Scr went up to 3.18. Torsemide was held for a part of the admission.  Today, the patient reports worsening shortness of breath and lower leg edema since most recent discharge. At home, she has been taking Torsemide '40mg'$  daily and metolazone on Mondays and Fridays. She denies chest pain. She reports an 8lb weight gain. Patient is definitely volume overloaded on exam.   Past Medical History:  Diagnosis Date  Acute bronchitis    Anemia    Arthritis    Asthma    B12 deficiency 03/14/2017   Chest pain 07/02/2018   Chest pain    a. 06/2018 MV: EF 66%. No ischemia/infarct; b. 02/2021 PET CT: EF>60%, no ischemia/infarct.   Chronic heart failure with preserved ejection fraction (HFpEF) ([place])    a. 08/2020 Echo: EF 55-60%, GrI DD; b. 06/2021 Echo: EF  55-60%, mild LVH, nl RV fxn, no signif valvular dzs.   COPD (chronic obstructive pulmonary disease) ([place]) 07/24/2018   Family history of blood clots 07/16/2017   Irritable bowel syndrome    Malignant hypertension    Obesity    OSA (obstructive sleep apnea)    uses cpap   Pneumonia 2017   Restless leg syndrome    Stroke [facility]) 1995   mild    Past Surgical History:  Procedure Laterality Date   CHOLECYSTECTOMY     COLONOSCOPY WITH PROPOFOL N/A 03/31/2018   Procedure: COLONOSCOPY WITH PROPOFOL;  Surgeon: Lucilla Lame, MD;  Location: [facility] ENDOSCOPY;  Service: Endoscopy;  Laterality: N/A;   KNEE ARTHROPLASTY Right 04/30/2019   Procedure: COMPUTER ASSISTED TOTAL KNEE ARTHROPLASTY;  Surgeon: Dereck Leep, MD;  Location: [facility] ORS;  Service: Orthopedics;  Laterality: Right;   RIGHT HEART CATH N/A 01/30/2022   Procedure: RIGHT HEART CATH;  Surgeon: Nelva Bush, MD;  Location: [place] [facility] LAB;  Service: Cardiovascular;  Laterality: N/A;   saliva gland removal  1998   TOTAL KNEE ARTHROPLASTY Left 2014   TUBAL LIGATION Bilateral     Current Medications: Current Meds  Medication Sig   acetaminophen (TYLENOL) 500 MG tablet Take 1,000 mg by mouth every 6 (six) hours as needed.   benzonatate (TESSALON) 100 MG capsule TAKE 1 CAPSULE BY MOUTH THREE TIMES A DAY AS NEEDED FOR COUGH   budesonide (PULMICORT) 0.5 MG/2ML nebulizer solution TAKE 2 ML (0.5 MG TOTAL) BY NEBULIZATION TWICE A DAY   calcitRIOL (ROCALTROL) 0.25 MCG capsule Take 0.25 mcg by mouth daily.   diltiazem (CARDIZEM CD) 240 MG 24 hr capsule Take 1 capsule (240 mg total) by mouth daily.   ipratropium-albuterol (DUONEB) 0.5-2.5 (3) MG/3ML SOLN TAKE 3 MLS BY NEBULIZATION EVERY 6 (SIX) HOURS AS NEEDED. J45.40   metoCLOPramide (REGLAN) 5 MG tablet Take 1 tablet (5 mg total) by mouth 3 (three) times daily before meals.   metolazone (ZAROXOLYN) 2.5 MG tablet Take 1 tablet (2.5 mg total) by mouth 2 (two) times a week.   NON  FORMULARY Bipap - nightly with Oxygen 2 Lpm   ondansetron (ZOFRAN) 4 MG tablet Take 1 tablet (4 mg total) by mouth daily as needed for nausea or vomiting.   OXYGEN Inhale into the lungs. 2 LPM [place]   pantoprazole (PROTONIX) 40 MG tablet TAKE 1 TABLET BY MOUTH EVERY DAY   PREVALITE 4 g packet DISSOLVE AND DRINK 1 PACKET BY MOUTH AT BEDTIME   promethazine (PHENERGAN) 25 MG tablet Take 1 tablet (25 mg total) by mouth every 6 (six) hours as needed.   rOPINIRole (REQUIP) 1 MG tablet Take 1 tablet (1 mg total) by mouth 3 (three) times daily.   Torsemide 40 MG TABS Take 40 mg by mouth daily.     Allergies:   Nuvigil [armodafinil], Penicillins, Spironolactone, Gabapentin, Melatonin, Provigil [modafinil], Entresto [sacubitril-valsartan], and Lisinopril   Social History   Socioeconomic History   Marital status: Widowed    Spouse name: Not on file   Number of children: 2   Years  of education: Not on file   Highest education level: Not on file  Occupational History   Not on file  Tobacco Use   Smoking status: Never   Smokeless tobacco: Never  Vaping Use   Vaping Use: Never used  Substance and Sexual Activity   Alcohol use: No    Alcohol/week: 0.0 standard drinks of alcohol   Drug use: No   Sexual activity: Never  Other Topics Concern   Not on file  Social History Narrative   Pt lives with her son   Social Determinants of Health   Financial Resource Strain: Medium Risk (08/14/2021)   Overall Financial Resource Strain (CARDIA)    Difficulty of Paying Living Expenses: Somewhat hard  Food Insecurity: No Food Insecurity (04/27/2022)   Hunger Vital Sign    Worried About Running Out of Food in the Last Year: Never true    Ran Out of Food in the Last Year: Never true  Transportation Needs: No Transportation Needs (04/27/2022)   PRAPARE - Hydrologist (Medical): No    Lack of Transportation (Non-Medical): No  Physical Activity: Inactive (08/08/2021)   Exercise  Vital Sign    Days of Exercise per Week: 0 days    Minutes of Exercise per Session: 0 min  Stress: No Stress Concern Present (08/08/2021)   [place]    Feeling of Stress : Only a little  Social Connections: Socially Isolated (08/08/2021)   Social Connection and Isolation Panel [NHANES]    Frequency of Communication with Friends and Family: Once a week    Frequency of Social Gatherings with Friends and Family: Never    Attends Religious Services: Never    Marine scientist or Organizations: No    Attends Archivist Meetings: Never    Marital Status: Widowed     Family History: The patient's family history includes COPD in her father; Coronary artery disease (age of onset: 83) in her brother; Dementia in her mother; Diabetes in her father; Heart failure in her brother and maternal grandmother; Stroke in her maternal grandmother. There is no history of Breast cancer.  ROS:   Please see the history of present illness.     All other systems reviewed and are negative.  EKGs/Labs/Other Studies Reviewed:    The following studies were reviewed today:  Echo 07/16/21  1. Left ventricular ejection fraction, by estimation, is 55 to 60%. The  left ventricle has normal function. Left ventricular endocardial border  not optimally defined to evaluate regional wall motion. There is mild left  ventricular hypertrophy. Left  ventricular diastolic parameters are indeterminate.   2. Right ventricular systolic function is normal. The right ventricular  size is normal. Tricuspid regurgitation signal is inadequate for assessing  PA pressure.   3. The mitral valve is normal in structure. No evidence of mitral valve  regurgitation. No evidence of mitral stenosis.   4. The aortic valve is normal in structure. Aortic valve regurgitation is  not visualized. No aortic stenosis is present.   5. The inferior vena cava is normal in  size with greater than 50%  respiratory variability, suggesting right atrial pressure of 3 mmHg.   6. Challenging image quality.    Echo 08/2020 1. Left ventricular ejection fraction, by estimation, is 55 to 60%. The  left ventricle has normal function. The left ventricle has no regional  wall motion abnormalities. Left ventricular diastolic parameters are  consistent  with Grade I diastolic  dysfunction (impaired relaxation).   2. Right ventricular systolic function is normal. The right ventricular  size is normal.   3. Challenging image quality.    Myoview Lexiscan 2020 Narrative & Impression  Pharmacological myocardial perfusion imaging study with no significant  ischemia Normal wall motion, EF estimated at 66% No EKG changes concerning for ischemia at peak stress or in recovery. Low risk scan     Signed, Esmond Plants, MD, Ph.D [facility]    EKG:  EKG is ordered today.  The ekg ordered today demonstrates ST 105bpm, no ST/T wave changes  Recent Labs: 01/23/2022: B Natriuretic Peptide 19.6 05/09/2022: ALT 11 05/13/2022: Hemoglobin 8.7; Magnesium 1.8; Platelets 275 05/14/2022: BUN 36; Creatinine, Ser 2.01; Potassium 4.0; Sodium 133  Recent Lipid Panel    Component Value Date/Time   CHOL 160 07/16/2021 0446   TRIG 84 05/09/2022 0621   HDL 81 07/16/2021 0446   CHOLHDL 2.0 07/16/2021 0446   VLDL 11 07/16/2021 0446   LDLCALC 68 07/16/2021 0446   Physical Exam:    VS:  BP (!) 126/50 (BP Location: Right Arm, Patient Position: Sitting, Cuff Size: Large)   Pulse (!) 105   Ht '5\' 4"'$  (1.626 m)   Wt (!) 338 lb 8 oz (153.5 kg)   SpO2 97% Comment: 2 liters  BMI 58.10 kg/m     Wt Readings from Last 3 Encounters:  05/28/22 (!) 338 lb 8 oz (153.5 kg)  05/13/22 (!) 326 lb (147.9 kg)  02/25/22 (!) 333 lb 11.2 oz (151.4 kg)     GEN:  Well nourished, well developed in no acute distress HEENT: Normal NECK: No JVD; No carotid bruits LYMPHATICS: No lymphadenopathy CARDIAC: RR,  tachycardic, no murmurs, rubs, gallops RESPIRATORY:  Clear to auscultation without rales, wheezing or rhonchi  ABDOMEN: Soft, non-tender, non-distended MUSCULOSKELETAL:  3+ lower extremity edema; redness and warmth; No deformity  SKIN: Warm and dry NEUROLOGIC:  Alert and oriented x 3 PSYCHIATRIC:  Normal affect   ASSESSMENT:    1. Acute on chronic diastolic heart failure ([place])   2. Essential hypertension   3. Sinus tachycardia   4. Stage 3 chronic kidney disease, unspecified whether stage 3a or 3b CKD ([place])   5. Chronic respiratory failure with hypoxia and hypercapnia (HCC)   6. Obstructive lung disease (generalized) ([place])    PLAN:    In order of problems listed above:  Acute on chronic HFpEF Patient reports progressive SOB, lower leg edema and 8lb weight gain since her hospitalization. Torsemide was held for part of the most recent hospitalization for AKI. Since being home, she has been taking Torsemide '40mg'$  daily with metolazone 2.'5mg'$  on Monday and Friday. Patient has 2-3+ lower extremity edema on exam. I will increase Torsemide to '80mg'$  daily x 3 days. She will take metolazone on Friday. I will see her back in 1 week to evaluate volume status. I will check a BMET at follow-up. She will continue to monitor her weights.  HTN BP is good today. Continue Diltiazem '240mg'$  daily.   Sinus tachycardia EKG shows sinus tachycardia with a heart rate of 105bpm on Cardizem '240mg'$  daily.   CKD stage 3 We will need to monitor closely with change in diuretics. BMET at follow-up. She follows with kidney doctors.   Chronic respiratory failure COPD/asthma Patient has been requiring supplemental O2 at all times.   Disposition: Follow up in 1 week(s) with MD/APP     Signed, Maximillian Habibi Ninfa Meeker, PA-C  05/28/2022  3:56 PM    [facility]

## 2022-05-28 NOTE — Patient Instructions (Addendum)
Medication Instructions: Your physician recommends the following medication changes.   INCREASE: Torsemide to 80 mg by mouth daily for 3 days, then return to 40 mg daily.  Make sure you take your metolazone this Friday  *If you need a refill on your cardiac medications before your next appointment, please call your pharmacy*   Lab Work: None ordered today   Testing/Procedures: None ordered today   Follow-Up: At [facility], you and your health needs are our priority.  As part of our continuing mission to provide you with exceptional heart care, we have created designated Provider Care Teams.  These Care Teams include your primary Cardiologist (physician) and Advanced Practice Providers (APPs -  Physician Assistants and Nurse Practitioners) who all work together to provide you with the care you need, when you need it.  We recommend signing up for the patient portal called "MyChart".  Sign up information is provided on this After Visit Summary.  MyChart is used to connect with patients for Virtual Visits (Telemedicine).  Patients are able to view lab/test results, encounter notes, upcoming appointments, etc.  Non-urgent messages can be sent to your provider as well.   To learn more about what you can do with MyChart, go to [email or web address].    Your next appointment:   1 week(s)  The format for your next appointment:   In Person  Provider:   Cadence Kathlen Mody, PA-C

## 2022-05-30 ENCOUNTER — Ambulatory Visit: Payer: Medicare Other | Admitting: Family Medicine

## 2022-05-30 ENCOUNTER — Ambulatory Visit: Payer: Self-pay | Admitting: *Deleted

## 2022-05-30 ENCOUNTER — Telehealth: Payer: Self-pay

## 2022-05-30 NOTE — Telephone Encounter (Signed)
Patient calling back to say that she still have edema going on and it very uncomfortable for her. Please advise

## 2022-05-30 NOTE — Telephone Encounter (Signed)
  Chief Complaint: Samantha Mason with [facility] called in that both of pt's legs are swollen, red, inflamed and tight looking.  Her Demadex was increased by cardiology on Monday from 40 mg to 60 mg but has not helped much.   Goes back to 40 mg tomorrow.   She has 3 plus pitting edema in her feet. Symptoms: Tightness and discomfort in her legs.  Left leg is weeping.   Frequency: For at least the last week. Pertinent Negatives: Patient denies chest pain.   Having her usual shortness of breath but unchanged from her usual.    Disposition: '[]'$ ED /'[]'$ Urgent Care (no appt availability in office) / '[x]'$ Appointment(In office/virtual)/ '[]'$  [place] [facility]/ '[]'$ Home Care/ '[]'$ Refused Recommended Disposition /'[]'$ [place] [facility]/ '[]'$  Follow-up with PCP Additional Notes: No appts. With Dr. Army Melia for today.  Made same day appt. With Dr. Rosette Reveal for 3:40 today.    Pt's son will be bringing her in.

## 2022-05-30 NOTE — Telephone Encounter (Signed)
Called pt concerning 3+pitting edema and weeping in legs. Pt sees cardiology and they recently increased her demadex. I advised pt, given her history, that she needs to call cardiology and have them aware of what is going on. She was in hospital in Sept with similar symptoms and was given IV lasix. I explained that Zigmund Daniel is ortho. And would not be of much help to her with what she has going on right now. Pt voiced understanding, stated she will call her cardiologist and the appt was cancelled.

## 2022-05-30 NOTE — Telephone Encounter (Signed)
Reason for Disposition  SEVERE leg swelling (e.g., swelling extends above knee, entire leg is swollen, weeping fluid)  Answer Assessment - Initial Assessment Questions 1. ONSET: "When did the swelling start?" (e.g., minutes, hours, days)     Samantha Mason with [facility] calling in.   Both legs are swollen and inflamed.   Edema is pitting plus 3 in both feet.   This happened a year ago. She chronic kidney diease and CHF, and respiratory failure.     2. LOCATION: "What part of the leg is swollen?"  "Are both legs swollen or just one leg?"     She called the cardiac PA on Monday.   They had her increase her Demadex.     She was taking 40 mg now 60 mg since Monday after talking with cardiology PA.    then back down to 40 mg on Friday.     Farxiga 10 mg daily.    3. SEVERITY: "How bad is the swelling?" (e.g., localized; mild, moderate, severe)   - Localized: Small area of swelling localized to one leg.   - MILD pedal edema: Swelling limited to foot and ankle, pitting edema < 1/4 inch (6 mm) deep, rest and elevation eliminate most or all swelling.   - MODERATE edema: Swelling of lower leg to knee, pitting edema > 1/4 inch (6 mm) deep, rest and elevation only partially reduce swelling.   - SEVERE edema: Swelling extends above knee, facial or hand swelling present.      Swelling about 2 inches above the knees.   From the knees down they are red and inflamed.    Left leg is weeping. 4. REDNESS: "Does the swelling look red or infected?"     Yes 5. PAIN: "Is the swelling painful to touch?" If Yes, ask: "How painful is it?"   (Scale 1-10; mild, moderate or severe)     Uncomfortable.   Legs and feet look really tight 6. FEVER: "Do you have a fever?" If Yes, ask: "What is it, how was it measured, and when did it start?"      Not asked 7. CAUSE: "What do you think is causing the leg swelling?"     Has CHF and CKD 8. MEDICAL HISTORY: "Do you have a history of blood clots (e.g., DVT), cancer, heart failure,  kidney disease, or liver failure?"     See above 9. RECURRENT SYMPTOM: "Have you had leg swelling before?" If Yes, ask: "When was the last time?" "What happened that time?"     Yes 10. OTHER SYMPTOMS: "Do you have any other symptoms?" (e.g., chest pain, difficulty breathing)       Pitting edema.    On 2L O2.    Shortness of breath has gotten worse since Monday.   Cardiologist is aware.     No chest pain.    11. PREGNANCY: "Is there any chance you are pregnant?" "When was your last menstrual period?"       N/A  Protocols used: Leg Swelling and Edema-A-AH

## 2022-05-30 NOTE — Telephone Encounter (Signed)
Left message to call back  

## 2022-06-06 ENCOUNTER — Telehealth: Payer: Self-pay

## 2022-06-06 ENCOUNTER — Ambulatory Visit: Payer: Self-pay | Admitting: *Deleted

## 2022-06-06 ENCOUNTER — Ambulatory Visit: Payer: Medicare Other | Admitting: Family Medicine

## 2022-06-06 NOTE — Telephone Encounter (Signed)
Reason for Disposition  [1] Longstanding difficulty breathing (e.g., CHF, COPD, emphysema) AND [2] WORSE than normal  Answer Assessment - Initial Assessment Questions 1. RESPIRATORY STATUS: "Describe your breathing?" (e.g., wheezing, shortness of breath, unable to speak, severe coughing)      Samantha Mason     O2 94 % on 2 L.    She's had to wear the oxygen for the last 2 weeks.    No recent illnesses.     Been a week that she is having shortness of breath.    She has mild labored breathing.      She is taking Mucinex.   Breath sounds dimished  all lobes.   Takes breathing treatments every 6 hours but now they are not lasting.    2. ONSET: "When did this breathing problem begin?"      When gains 5 lbs she is to take a fluid pill.  Demadex daily is being taken 40 mg in the mornings.    She has swelling in both legs swollen and weeping and feet too.     3. PATTERN "Does the difficult breathing come and go, or has it been constant since it started?"      All the time 4. SEVERITY: "How bad is your breathing?" (e.g., mild, moderate, severe)    - MILD: No SOB at rest, mild SOB with walking, speaks normally in sentences, can lie down, no retractions, pulse < 100.    - MODERATE: SOB at rest, SOB with minimal exertion and prefers to sit, cannot lie down flat, speaks in phrases, mild retractions, audible wheezing, pulse 100-120.    - SEVERE: Very SOB at rest, speaks in single words, struggling to breathe, sitting hunched forward, retractions, pulse > 120      Moderate 5. RECURRENT SYMPTOM: "Have you had difficulty breathing before?" If Yes, ask: "When was the last time?" and "What happened that time?"      Yes 6. CARDIAC HISTORY: "Do you have any history of heart disease?" (e.g., heart attack, angina, bypass surgery, angioplasty)      CKD stage 4,  CHF, COPD    7. LUNG HISTORY: "Do you have any history of lung disease?"  (e.g., pulmonary embolus, asthma, emphysema)     Asthma and several other lung issues.    8. CAUSE: "What do you think is causing the breathing problem?"      Weight gain of fluid.    She's saying a lot.    10 lbs according to their records. 9. OTHER SYMPTOMS: "Do you have any other symptoms? (e.g., dizziness, runny nose, cough, chest pain, fever)     No 10. O2 SATURATION MONITOR:  "Do you use an oxygen saturation monitor (pulse oximeter) at home?" If Yes, ask: "What is your reading (oxygen level) today?" "What is your usual oxygen saturation reading?" (e.g., 95%)       O2 sat. Low on oxygen 11. PREGNANCY: "Is there any chance you are pregnant?" "When was your last menstrual period?"       N/A 12. TRAVEL: "Have you traveled out of the country in the last month?" (e.g., travel history, exposures)       No  Protocols used: Breathing Difficulty-A-AH

## 2022-06-06 NOTE — Telephone Encounter (Signed)
  Chief Complaint: Samantha Mason with [facility] called in with pt. On speaker phone.   She has gained 10 lbs in fluid and is having shortness of breath.  Using O2 at 2L all the time and it's usually as needed. Symptoms: Shortness of breath most of the time, swelling in her feet and legs with weeping Frequency: Over the last week  Pertinent Negatives: Patient denies chest pain or recent URI illnesses.   She does have COPD, asthma, several chronic illnesses. Disposition: '[]'$ ED /'[]'$ Urgent Care (no appt availability in office) / '[x]'$ Appointment(In office/virtual)/ '[]'$  [place] Virtual Care/ '[]'$ Home Care/ '[]'$ Refused Recommended Disposition /'[]'$ [place] [facility]/ '[]'$  Follow-up with PCP Additional Notes: Per protocol I have made her an appt. With Dr. Otilio Miu as there are no appts. With Dr. Army Melia for today.   It's at 4:20 today in office.   Went over s/s pt should go on to the ED should they occur.   She verbalized understanding.

## 2022-06-06 NOTE — Telephone Encounter (Signed)
I called pt to verify she had called her cardiologist. She stated she was having to leave messages. I called Dr Darnelle Bos office and spoke to Alicia/nurse and explained that pt had been having symptoms of swelling, pitting edema with wheeping and low o2 even on constant 2L oxygen per  pt. I asked if Elmo Putt could call her and advise her to go to ER OR could they see her today or tomorrow. Per Elmo Putt, she  called pt and advised her to go to ER where she could get IV lasix.

## 2022-06-06 NOTE — Telephone Encounter (Signed)
Received a call from pt's pcp stating they received a call on 1/11 indicating pt was experiencing 3 + pitting edema with leg tightness and redness and SOB. She report pt was instructed to contact cardiologist, however no notation of call. She reports today pt called with 10 lbs weight gain, sob at rest, labored breathing, bilateral leg swelling, and weeping. She also reports pt is having to use 2 L O2 continuously. Recommendation from pcp office was to report to ER for further evaluations. However pt refused.  Nurse contacted pt. Pt was audibly short of breath on the phone. Nurse recommended pt to report to ER. Pt verbalized understanding and stated she will have son take her.   PCP office made aware

## 2022-06-06 NOTE — Telephone Encounter (Signed)
I called and spoke to [name]. He was unaware that he was taking the pt to hospital. He asked pt, while I was on the phone, "Am I taking you to the hospital?" She is going to the hospital per the son now

## 2022-06-07 ENCOUNTER — Telehealth: Payer: Self-pay

## 2022-06-07 NOTE — Telephone Encounter (Signed)
Patient was scheduled with Dr Ronnald Ramp for 10 lb weight gain, and shortness of breath. Dr Ronnald Ramp asked me to call patient to have her to call her cardiologist and find out what they recommend.  Informed patient and canceled her appointment with Dr Ronnald Ramp. Informed her she may need to go to the ER. She said she does not feel like she needs to go but she will call cardiology for recommendation.

## 2022-06-07 NOTE — Telephone Encounter (Signed)
Called William/son to see if pt went to hospital. There was no answer so I left a message stating for him to call us and let us know if she went to hospital and how she is doing. As of now, no evidence that she has went to ER. Cardio nurse spoke to her yesterday, as well as myself, and advised her to go to ER yesterday.

## 2022-06-08 ENCOUNTER — Inpatient Hospital Stay
Admission: EM | Admit: 2022-06-08 | Discharge: 2022-07-09 | DRG: 870 | Disposition: A | Discharge status: Hospice | Payer: Medicare Other | Attending: Internal Medicine | Admitting: Internal Medicine

## 2022-06-08 ENCOUNTER — Emergency Department: Payer: Medicare Other

## 2022-06-08 ENCOUNTER — Other Ambulatory Visit: Payer: Self-pay

## 2022-06-08 DIAGNOSIS — E662 Morbid (severe) obesity with alveolar hypoventilation: Secondary | ICD-10-CM | POA: Diagnosis present

## 2022-06-08 DIAGNOSIS — Z79899 Other long term (current) drug therapy: Secondary | ICD-10-CM

## 2022-06-08 DIAGNOSIS — Z66 Do not resuscitate: Secondary | ICD-10-CM | POA: Diagnosis not present

## 2022-06-08 DIAGNOSIS — J189 Pneumonia, unspecified organism: Secondary | ICD-10-CM | POA: Diagnosis present

## 2022-06-08 DIAGNOSIS — J9622 Acute and chronic respiratory failure with hypercapnia: Secondary | ICD-10-CM | POA: Diagnosis present

## 2022-06-08 DIAGNOSIS — L03116 Cellulitis of left lower limb: Secondary | ICD-10-CM | POA: Diagnosis present

## 2022-06-08 DIAGNOSIS — Z833 Family history of diabetes mellitus: Secondary | ICD-10-CM

## 2022-06-08 DIAGNOSIS — Z8249 Family history of ischemic heart disease and other diseases of the circulatory system: Secondary | ICD-10-CM

## 2022-06-08 DIAGNOSIS — Z6841 Body Mass Index (BMI) 40.0 and over, adult: Secondary | ICD-10-CM | POA: Diagnosis not present

## 2022-06-08 DIAGNOSIS — J9620 Acute and chronic respiratory failure, unspecified whether with hypoxia or hypercapnia: Secondary | ICD-10-CM | POA: Diagnosis not present

## 2022-06-08 DIAGNOSIS — J9601 Acute respiratory failure with hypoxia: Secondary | ICD-10-CM

## 2022-06-08 DIAGNOSIS — J9602 Acute respiratory failure with hypercapnia: Secondary | ICD-10-CM | POA: Diagnosis not present

## 2022-06-08 DIAGNOSIS — Z515 Encounter for palliative care: Secondary | ICD-10-CM

## 2022-06-08 DIAGNOSIS — L03115 Cellulitis of right lower limb: Secondary | ICD-10-CM | POA: Diagnosis present

## 2022-06-08 DIAGNOSIS — G4733 Obstructive sleep apnea (adult) (pediatric): Secondary | ICD-10-CM

## 2022-06-08 DIAGNOSIS — J441 Chronic obstructive pulmonary disease with (acute) exacerbation: Secondary | ICD-10-CM | POA: Diagnosis present

## 2022-06-08 DIAGNOSIS — B379 Candidiasis, unspecified: Secondary | ICD-10-CM | POA: Diagnosis present

## 2022-06-08 DIAGNOSIS — Z7951 Long term (current) use of inhaled steroids: Secondary | ICD-10-CM

## 2022-06-08 DIAGNOSIS — Z823 Family history of stroke: Secondary | ICD-10-CM

## 2022-06-08 DIAGNOSIS — I5032 Chronic diastolic (congestive) heart failure: Secondary | ICD-10-CM | POA: Diagnosis present

## 2022-06-08 DIAGNOSIS — N189 Chronic kidney disease, unspecified: Secondary | ICD-10-CM | POA: Diagnosis not present

## 2022-06-08 DIAGNOSIS — A419 Sepsis, unspecified organism: Principal | ICD-10-CM | POA: Diagnosis present

## 2022-06-08 DIAGNOSIS — E871 Hypo-osmolality and hyponatremia: Secondary | ICD-10-CM | POA: Diagnosis not present

## 2022-06-08 DIAGNOSIS — Z880 Allergy status to penicillin: Secondary | ICD-10-CM

## 2022-06-08 DIAGNOSIS — Z7189 Other specified counseling: Secondary | ICD-10-CM

## 2022-06-08 DIAGNOSIS — E860 Dehydration: Secondary | ICD-10-CM | POA: Diagnosis present

## 2022-06-08 DIAGNOSIS — J209 Acute bronchitis, unspecified: Secondary | ICD-10-CM | POA: Diagnosis present

## 2022-06-08 DIAGNOSIS — I10 Essential (primary) hypertension: Secondary | ICD-10-CM | POA: Diagnosis not present

## 2022-06-08 DIAGNOSIS — Z96653 Presence of artificial knee joint, bilateral: Secondary | ICD-10-CM | POA: Diagnosis present

## 2022-06-08 DIAGNOSIS — N2581 Secondary hyperparathyroidism of renal origin: Secondary | ICD-10-CM | POA: Diagnosis present

## 2022-06-08 DIAGNOSIS — Z8673 Personal history of transient ischemic attack (TIA), and cerebral infarction without residual deficits: Secondary | ICD-10-CM | POA: Diagnosis not present

## 2022-06-08 DIAGNOSIS — Z862 Personal history of diseases of the blood and blood-forming organs and certain disorders involving the immune mechanism: Secondary | ICD-10-CM | POA: Diagnosis not present

## 2022-06-08 DIAGNOSIS — R6521 Severe sepsis with septic shock: Secondary | ICD-10-CM | POA: Diagnosis not present

## 2022-06-08 DIAGNOSIS — E874 Mixed disorder of acid-base balance: Secondary | ICD-10-CM | POA: Diagnosis present

## 2022-06-08 DIAGNOSIS — Z1152 Encounter for screening for COVID-19: Secondary | ICD-10-CM | POA: Diagnosis not present

## 2022-06-08 DIAGNOSIS — G928 Other toxic encephalopathy: Secondary | ICD-10-CM | POA: Diagnosis present

## 2022-06-08 DIAGNOSIS — G9341 Metabolic encephalopathy: Secondary | ICD-10-CM | POA: Diagnosis not present

## 2022-06-08 DIAGNOSIS — N390 Urinary tract infection, site not specified: Secondary | ICD-10-CM | POA: Diagnosis present

## 2022-06-08 DIAGNOSIS — Z91199 Patient's noncompliance with other medical treatment and regimen due to unspecified reason: Secondary | ICD-10-CM

## 2022-06-08 DIAGNOSIS — I5033 Acute on chronic diastolic (congestive) heart failure: Secondary | ICD-10-CM | POA: Diagnosis present

## 2022-06-08 DIAGNOSIS — Z888 Allergy status to other drugs, medicaments and biological substances status: Secondary | ICD-10-CM

## 2022-06-08 DIAGNOSIS — I130 Hypertensive heart and chronic kidney disease with heart failure and stage 1 through stage 4 chronic kidney disease, or unspecified chronic kidney disease: Secondary | ICD-10-CM | POA: Diagnosis present

## 2022-06-08 DIAGNOSIS — Z825 Family history of asthma and other chronic lower respiratory diseases: Secondary | ICD-10-CM

## 2022-06-08 DIAGNOSIS — Z7984 Long term (current) use of oral hypoglycemic drugs: Secondary | ICD-10-CM

## 2022-06-08 DIAGNOSIS — D631 Anemia in chronic kidney disease: Secondary | ICD-10-CM | POA: Diagnosis present

## 2022-06-08 DIAGNOSIS — N179 Acute kidney failure, unspecified: Secondary | ICD-10-CM | POA: Diagnosis present

## 2022-06-08 DIAGNOSIS — L0390 Cellulitis, unspecified: Secondary | ICD-10-CM

## 2022-06-08 DIAGNOSIS — E870 Hyperosmolality and hypernatremia: Secondary | ICD-10-CM | POA: Diagnosis not present

## 2022-06-08 DIAGNOSIS — G2581 Restless legs syndrome: Secondary | ICD-10-CM | POA: Diagnosis present

## 2022-06-08 DIAGNOSIS — J9621 Acute and chronic respiratory failure with hypoxia: Secondary | ICD-10-CM | POA: Diagnosis present

## 2022-06-08 DIAGNOSIS — N170 Acute kidney failure with tubular necrosis: Secondary | ICD-10-CM | POA: Diagnosis not present

## 2022-06-08 DIAGNOSIS — G4700 Insomnia, unspecified: Secondary | ICD-10-CM | POA: Diagnosis not present

## 2022-06-08 DIAGNOSIS — J440 Chronic obstructive pulmonary disease with acute lower respiratory infection: Secondary | ICD-10-CM | POA: Diagnosis present

## 2022-06-08 DIAGNOSIS — I890 Lymphedema, not elsewhere classified: Secondary | ICD-10-CM | POA: Diagnosis present

## 2022-06-08 DIAGNOSIS — R4182 Altered mental status, unspecified: Secondary | ICD-10-CM | POA: Diagnosis not present

## 2022-06-08 DIAGNOSIS — Z9981 Dependence on supplemental oxygen: Secondary | ICD-10-CM

## 2022-06-08 DIAGNOSIS — Z820 Family history of epilepsy and other diseases of the nervous system: Secondary | ICD-10-CM

## 2022-06-08 DIAGNOSIS — E876 Hypokalemia: Secondary | ICD-10-CM | POA: Diagnosis not present

## 2022-06-08 DIAGNOSIS — R6520 Severe sepsis without septic shock: Secondary | ICD-10-CM | POA: Diagnosis not present

## 2022-06-08 DIAGNOSIS — N1832 Chronic kidney disease, stage 3b: Secondary | ICD-10-CM | POA: Diagnosis present

## 2022-06-08 LAB — CBC WITH DIFFERENTIAL/PLATELET
Abs Immature Granulocytes: 0.1 10*3/uL — ABNORMAL HIGH (ref 0.00–0.07)
Basophils Absolute: 0.1 10*3/uL (ref 0.0–0.1)
Basophils Relative: 0 %
Eosinophils Absolute: 0.1 10*3/uL (ref 0.0–0.5)
Eosinophils Relative: 1 %
HCT: 27.7 % — ABNORMAL LOW (ref 36.0–46.0)
Hemoglobin: 8 g/dL — ABNORMAL LOW (ref 12.0–15.0)
Immature Granulocytes: 1 %
Lymphocytes Relative: 5 %
Lymphs Abs: 0.6 10*3/uL — ABNORMAL LOW (ref 0.7–4.0)
MCH: 24 pg — ABNORMAL LOW (ref 26.0–34.0)
MCHC: 28.9 g/dL — ABNORMAL LOW (ref 30.0–36.0)
MCV: 83.2 fL (ref 80.0–100.0)
Monocytes Absolute: 0.8 10*3/uL (ref 0.1–1.0)
Monocytes Relative: 6 %
Neutro Abs: 11.3 10*3/uL — ABNORMAL HIGH (ref 1.7–7.7)
Neutrophils Relative %: 87 %
Platelets: 221 10*3/uL (ref 150–400)
RBC: 3.33 MIL/uL — ABNORMAL LOW (ref 3.87–5.11)
RDW: 17 % — ABNORMAL HIGH (ref 11.5–15.5)
WBC: 12.9 10*3/uL — ABNORMAL HIGH (ref 4.0–10.5)
nRBC: 0 % (ref 0.0–0.2)

## 2022-06-08 LAB — URINALYSIS, COMPLETE (UACMP) WITH MICROSCOPIC
Bilirubin Urine: NEGATIVE
Glucose, UA: NEGATIVE mg/dL
Ketones, ur: NEGATIVE mg/dL
Leukocytes,Ua: NEGATIVE
Nitrite: NEGATIVE
Protein, ur: NEGATIVE mg/dL
RBC / HPF: >50 RBC/hpf — ABNORMAL HIGH (ref 0–5)
Specific Gravity, Urine: 1.038 — ABNORMAL HIGH (ref 1.005–1.030)
pH: 5 (ref 5.0–8.0)

## 2022-06-08 LAB — COMPREHENSIVE METABOLIC PANEL
ALT: 8 U/L (ref 0–44)
AST: 17 U/L (ref 15–41)
Albumin: 2.4 g/dL — ABNORMAL LOW (ref 3.5–5.0)
Alkaline Phosphatase: 54 U/L (ref 38–126)
Anion gap: 12 (ref 5–15)
BUN: 39 mg/dL — ABNORMAL HIGH (ref 8–23)
CO2: 29 mmol/L (ref 22–32)
Calcium: 7.8 mg/dL — ABNORMAL LOW (ref 8.9–10.3)
Chloride: 96 mmol/L — ABNORMAL LOW (ref 98–111)
Creatinine, Ser: 1.59 mg/dL — ABNORMAL HIGH (ref 0.44–1.00)
GFR, Estimated: 35 mL/min — ABNORMAL LOW (ref >60)
Glucose, Bld: 123 mg/dL — ABNORMAL HIGH (ref 70–99)
Potassium: 3.5 mmol/L (ref 3.5–5.1)
Sodium: 137 mmol/L (ref 135–145)
Total Bilirubin: 0.6 mg/dL (ref 0.3–1.2)
Total Protein: 6.6 g/dL (ref 6.5–8.1)

## 2022-06-08 LAB — LACTIC ACID, PLASMA
Lactic Acid, Venous: 1.1 mmol/L (ref 0.5–1.9)
Lactic Acid, Venous: 0.9 mmol/L (ref 0.5–1.9)

## 2022-06-08 LAB — RESP PANEL BY RT-PCR (RSV, FLU A&B, COVID)  RVPGX2
Influenza A by PCR: NEGATIVE
Influenza B by PCR: NEGATIVE
Resp Syncytial Virus by PCR: NEGATIVE
SARS Coronavirus 2 by RT PCR: NEGATIVE

## 2022-06-08 LAB — PROTIME-INR
INR: 1.3 — ABNORMAL HIGH (ref 0.8–1.2)
Prothrombin Time: 15.7 seconds — ABNORMAL HIGH (ref 11.4–15.2)

## 2022-06-08 LAB — APTT: aPTT: 32 seconds (ref 24–36)

## 2022-06-08 MED ORDER — IOHEXOL 300 MG/ML  SOLN
75.0000 mL | Freq: Once | INTRAMUSCULAR | Status: AC | PRN
  Administered 2022-06-08: 75 mL via INTRAVENOUS

## 2022-06-08 MED ORDER — OXYCODONE HCL 5 MG PO TABS: 7.5000 mg | ORAL_TABLET | Freq: Four times a day (QID) | ORAL | Status: DC | PRN

## 2022-06-08 MED ORDER — VANCOMYCIN HCL IN DEXTROSE 1-5 GM/200ML-% IV SOLN: 1000.0000 mg | Freq: Once | INTRAVENOUS | Status: DC

## 2022-06-08 MED ORDER — OXYCODONE HCL 5 MG PO TABS: 5.0000 mg | ORAL_TABLET | Freq: Four times a day (QID) | ORAL | Status: DC | PRN

## 2022-06-08 MED ORDER — SODIUM CHLORIDE 0.9 % IV SOLN: INTRAVENOUS | Status: DC

## 2022-06-08 MED ORDER — HEPARIN SODIUM (PORCINE) 5000 UNIT/ML IJ SOLN
5000.0000 [IU] | Freq: Three times a day (TID) | INTRAMUSCULAR | Status: AC
  Administered 2022-06-09 – 2022-07-05 (×80): 5000 [IU] via SUBCUTANEOUS
  Filled 2022-06-08 (×77): qty 1

## 2022-06-08 MED ORDER — PIPERACILLIN-TAZOBACTAM 3.375 G IVPB 30 MIN
3.3750 g | Freq: Once | INTRAVENOUS | Status: AC
  Administered 2022-06-08: 3.375 g via INTRAVENOUS
  Filled 2022-06-08: qty 50

## 2022-06-08 MED ORDER — CALCITRIOL 0.25 MCG PO CAPS
0.2500 ug | ORAL_CAPSULE | Freq: Every day | ORAL | Status: DC
  Administered 2022-06-09 – 2022-06-10 (×2): 0.25 ug via ORAL
  Filled 2022-06-08 (×5): qty 1

## 2022-06-08 MED ORDER — ROPINIROLE HCL 1 MG PO TABS: 1.0000 mg | ORAL_TABLET | Freq: Three times a day (TID) | ORAL | Status: DC

## 2022-06-08 MED ORDER — OXYCODONE HCL 5 MG PO TABS
5.0000 mg | ORAL_TABLET | Freq: Four times a day (QID) | ORAL | Status: DC | PRN
  Administered 2022-06-10: 5 mg via ORAL
  Filled 2022-06-08: qty 1

## 2022-06-08 MED ORDER — SODIUM CHLORIDE 0.9 % IV BOLUS
2000.0000 mL | Freq: Once | INTRAVENOUS | Status: AC
  Administered 2022-06-08: 2000 mL via INTRAVENOUS

## 2022-06-08 MED ORDER — ONDANSETRON HCL 4 MG PO TABS
4.0000 mg | ORAL_TABLET | Freq: Four times a day (QID) | ORAL | Status: DC | PRN
  Administered 2022-06-30 (×2): 4 mg via ORAL
  Filled 2022-06-08 (×2): qty 1

## 2022-06-08 MED ORDER — ROPINIROLE HCL 1 MG PO TABS
1.0000 mg | ORAL_TABLET | Freq: Three times a day (TID) | ORAL | Status: DC
  Administered 2022-06-08 – 2022-06-10 (×5): 1 mg via ORAL
  Filled 2022-06-08 (×15): qty 1

## 2022-06-08 MED ORDER — IPRATROPIUM-ALBUTEROL 0.5-2.5 (3) MG/3ML IN SOLN
3.0000 mL | RESPIRATORY_TRACT | Status: DC | PRN
  Administered 2022-06-18 – 2022-07-03 (×4): 3 mL via RESPIRATORY_TRACT
  Filled 2022-06-08 (×3): qty 3

## 2022-06-08 MED ORDER — VANCOMYCIN HCL IN DEXTROSE 1-5 GM/200ML-% IV SOLN: 1000.0000 mg | INTRAVENOUS | Status: DC

## 2022-06-08 MED ORDER — BENZONATATE 100 MG PO CAPS: 100.0000 mg | ORAL_CAPSULE | Freq: Two times a day (BID) | ORAL | Status: DC | PRN

## 2022-06-08 MED ORDER — PANTOPRAZOLE SODIUM 40 MG PO TBEC
40.0000 mg | DELAYED_RELEASE_TABLET | Freq: Every day | ORAL | Status: DC
  Administered 2022-06-09 – 2022-06-10 (×2): 40 mg via ORAL
  Filled 2022-06-08 (×2): qty 1

## 2022-06-08 MED ORDER — VANCOMYCIN HCL 1500 MG/300ML IV SOLN
1500.0000 mg | Freq: Once | INTRAVENOUS | Status: AC
  Administered 2022-06-09: 1500 mg via INTRAVENOUS
  Filled 2022-06-08: qty 300

## 2022-06-08 MED ORDER — ONDANSETRON HCL 4 MG/2ML IJ SOLN
4.0000 mg | Freq: Once | INTRAMUSCULAR | Status: AC
  Administered 2022-06-08: 4 mg via INTRAVENOUS
  Filled 2022-06-08: qty 2

## 2022-06-08 MED ORDER — ONDANSETRON HCL 4 MG/2ML IJ SOLN
4.0000 mg | Freq: Four times a day (QID) | INTRAMUSCULAR | Status: DC | PRN
  Administered 2022-07-07: 4 mg via INTRAVENOUS
  Filled 2022-06-08: qty 2

## 2022-06-08 MED ORDER — DAPAGLIFLOZIN PROPANEDIOL 10 MG PO TABS
10.0000 mg | ORAL_TABLET | Freq: Every day | ORAL | Status: DC
  Filled 2022-06-08: qty 1

## 2022-06-08 MED ORDER — DILTIAZEM HCL ER COATED BEADS 240 MG PO CP24
240.0000 mg | ORAL_CAPSULE | Freq: Every day | ORAL | Status: DC
  Administered 2022-06-09: 240 mg via ORAL
  Filled 2022-06-08: qty 1

## 2022-06-08 MED ORDER — VANCOMYCIN HCL 1250 MG/250ML IV SOLN: 1250.0000 mg | INTRAVENOUS | Status: DC

## 2022-06-08 MED ORDER — ACETAMINOPHEN 500 MG PO TABS
1000.0000 mg | ORAL_TABLET | Freq: Once | ORAL | Status: AC
  Administered 2022-06-09: 1000 mg via ORAL
  Filled 2022-06-08: qty 2

## 2022-06-08 MED ORDER — VANCOMYCIN HCL IN DEXTROSE 1-5 GM/200ML-% IV SOLN
1000.0000 mg | Freq: Once | INTRAVENOUS | Status: AC
  Administered 2022-06-08: 1000 mg via INTRAVENOUS
  Filled 2022-06-08: qty 200

## 2022-06-08 NOTE — ED Triage Notes (Signed)
Since Thursday, pt has been sick, potential uti/sepsis, family states she is normally independent, but now can't complete adl's. No known recent infections or abx use.  Pt a&0x4, states she is feeling sob, and very cold  120's HR, 93% on 2 L (chronic), axillary temp 104.4 with ems  500 NS, 975 of PO tylenol with ems

## 2022-06-08 NOTE — ED Provider Notes (Signed)
[facility] Provider Note    Event Date/Time   First MD Initiated Contact with Patient 06/08/22 1907     (approximate)   History   No chief complaint on file.   HPI  Samantha Mason is a 72 y.o. female   Past medical history of COPD, prior stroke, heart failure, OSA, elevated BMI, restless legs, he is on 2 L nasal cannula chronically who presents to the emergency department for sepsis evaluation.  She states generally has been feeling under the weather, generalized weakness, productive cough, chills, short of breath for the last several days.  Denies chest pain or abdominal pain.  She has had increase in the redness on her right leg.  Independent Historian contributed to assessment above: Family member at bedside  External Medical Documents Reviewed: Discharge summary from hospital dated 05/14/2022 when she was admitted for small bowel obstruction      Physical Exam   Triage Vital Signs: ED Triage Vitals  Enc Vitals Group     BP 06/08/22 1902 (!) 129/53     Pulse Rate 06/08/22 1902 (!) 109     Resp 06/08/22 1930 (!) 27     Temp 06/08/22 1902 (!) 105 F (40.6 C)     Temp Source 06/08/22 1902 Oral     SpO2 06/08/22 1902 96 %     Weight --      Height --      Head Circumference --      Peak Flow --      Pain Score 06/08/22 1904 9     Pain Loc --      Pain Edu? --      Excl. in [place]? --     Most recent vital signs: Vitals:   06/08/22 2300 06/08/22 2330  BP: (!) 117/55 (!) 119/55  Pulse: 96 91  Resp:  20  Temp:    SpO2: 95% 97%    General: Awake, no distress.  CV:  Good peripheral perfusion.  Resp:  Normal effort.  Abd:  No distention.  Other:  Awake alert oriented hemodynamics initially showed tachycardia as well as a fever.  She had red erythematous cellulitic changes to her right upper thigh without crepitus or pain out of proportion.  Breath sounds diminished but no obvious focalities or wheezing   ED Results / Procedures /  Treatments   Labs (all labs ordered are listed, but only abnormal results are displayed) Labs Reviewed  COMPREHENSIVE METABOLIC PANEL - Abnormal; Notable for the following components:      Result Value   Chloride 96 (*)    Glucose, Bld 123 (*)    BUN 39 (*)    Creatinine, Ser 1.59 (*)    Calcium 7.8 (*)    Albumin 2.4 (*)    GFR, Estimated 35 (*)    All other components within normal limits  CBC WITH DIFFERENTIAL/PLATELET - Abnormal; Notable for the following components:   WBC 12.9 (*)    RBC 3.33 (*)    Hemoglobin 8.0 (*)    HCT 27.7 (*)    MCH 24.0 (*)    MCHC 28.9 (*)    RDW 17.0 (*)    Neutro Abs 11.3 (*)    Lymphs Abs 0.6 (*)    Abs Immature Granulocytes 0.10 (*)    All other components within normal limits  PROTIME-INR - Abnormal; Notable for the following components:   Prothrombin Time 15.7 (*)    INR 1.3 (*)  All other components within normal limits  URINALYSIS, COMPLETE (UACMP) WITH MICROSCOPIC - Abnormal; Notable for the following components:   Color, Urine YELLOW (*)    APPearance CLOUDY (*)    Specific Gravity, Urine 1.038 (*)    Hgb urine dipstick LARGE (*)    RBC / HPF >50 (*)    Bacteria, UA RARE (*)    All other components within normal limits  RESP PANEL BY RT-PCR (RSV, FLU A&B, COVID)  RVPGX2  CULTURE, BLOOD (ROUTINE X 2)  CULTURE, BLOOD (ROUTINE X 2)  URINE CULTURE  LACTIC ACID, PLASMA  LACTIC ACID, PLASMA  APTT  CORTISOL-AM, BLOOD  PROCALCITONIN  HEMOGLOBIN A1C  COMPREHENSIVE METABOLIC PANEL  CBC     I ordered and reviewed the above labs they are notable for elevated white blood cell count 12.9.  EKG  ED ECG REPORT I, Lucillie Garfinkel, the attending physician, personally viewed and interpreted this ECG.   Date: 06/08/2022  EKG Time: 1916  Rate: 1115  Rhythm: sinus tachy  Axis: nl  Intervals:none  ST&T Change: No acute ischemic changes    RADIOLOGY I independently reviewed and interpreted chest x-ray see bilateral lower lobe  opacities   PROCEDURES:  Critical Care performed: Yes, see critical care procedure note(s)  .Critical Care  Performed by: Lucillie Garfinkel, MD Authorized by: Lucillie Garfinkel, MD   Critical care provider statement:    Critical care time (minutes):  30   Critical care was necessary to treat or prevent imminent or life-threatening deterioration of the following conditions:  Sepsis   Critical care was time spent personally by me on the following activities:  Development of treatment plan with patient or surrogate, discussions with consultants, evaluation of patient's response to treatment, examination of patient, ordering and review of laboratory studies, ordering and review of radiographic studies, ordering and performing treatments and interventions, pulse oximetry, re-evaluation of patient's condition and review of old charts    MEDICATIONS ORDERED IN ED: Medications  acetaminophen (TYLENOL) tablet 1,000 mg (1,000 mg Oral Not Given 06/08/22 1916)  rOPINIRole (REQUIP) tablet 1 mg (1 mg Oral Given 06/08/22 2141)  vancomycin (VANCOCIN) IVPB 1000 mg/200 mL premix (0 mg Intravenous Stopped 06/09/22 0014)    Followed by  vancomycin (VANCOREADY) IVPB 1500 mg/300 mL (has no administration in time range)  diltiazem (CARDIZEM CD) 24 hr capsule 240 mg (has no administration in time range)  calcitRIOL (ROCALTROL) capsule 0.25 mcg (has no administration in time range)  dapagliflozin propanediol (FARXIGA) tablet 10 mg (has no administration in time range)  pantoprazole (PROTONIX) EC tablet 40 mg (40 mg Oral Not Given 06/08/22 2357)  benzonatate (TESSALON) capsule 100 mg (has no administration in time range)  ipratropium-albuterol (DUONEB) 0.5-2.5 (3) MG/3ML nebulizer solution 3 mL (has no administration in time range)  heparin injection 5,000 Units (has no administration in time range)  0.9 %  sodium chloride infusion (has no administration in time range)  ondansetron (ZOFRAN) tablet 4 mg (has no administration  in time range)    Or  ondansetron (ZOFRAN) injection 4 mg (has no administration in time range)  vancomycin (VANCOCIN) IVPB 1000 mg/200 mL premix (has no administration in time range)    And  vancomycin (VANCOREADY) IVPB 1250 mg/250 mL (has no administration in time range)  oxyCODONE (Oxy IR/ROXICODONE) immediate release tablet 5 mg (has no administration in time range)  sodium chloride 0.9 % bolus 2,000 mL (0 mLs Intravenous Stopped 06/08/22 2135)  piperacillin-tazobactam (ZOSYN) IVPB 3.375 g (0 g Intravenous Stopped  06/08/22 1959)  ondansetron (ZOFRAN) injection 4 mg (4 mg Intravenous Given 06/08/22 1929)  iohexol (OMNIPAQUE) 300 MG/ML solution 75 mL (75 mLs Intravenous Contrast Given 06/08/22 2047)    External physician / consultants:  I spoke with hospitalist for admission and regarding care plan for this patient.   IMPRESSION / MDM / ASSESSMENT AND PLAN / ED COURSE  I reviewed the triage vital signs and the nursing notes.                                Patient's presentation is most consistent with acute presentation with potential threat to life or bodily function.  Differential diagnosis includes, but is not limited to, sepsis, infection due to urinary tract infection, respiratory infection, skin infection, metabolic derangements   The patient is on the cardiac monitor to evaluate for evidence of arrhythmia and/or significant heart rate changes.  MDM: Patient with sepsis evaluation given vital sign abnormalities including tachycardia and fever found to have evidence of pneumonia as well as skin infection.  Was given broad-spectrum antibiotics and reassessment with normalization of heart rate and defervescence of fever.  Patient to be admitted to hospitalist service for sepsis         FINAL CLINICAL IMPRESSION(S) / ED DIAGNOSES   Final diagnoses:  Sepsis, due to unspecified organism, unspecified whether acute organ dysfunction present ([place])  Cellulitis, unspecified  cellulitis site  Community acquired pneumonia, unspecified laterality     Rx / DC Orders   ED Discharge Orders     None        Note:  This document was prepared using Dragon voice recognition software and may include unintentional dictation errors.    Lucillie Garfinkel, MD 06/09/22 Laureen Abrahams

## 2022-06-08 NOTE — H&P (Signed)
History and Physical    Patient: Samantha Mason YME:[id number] DOB: 05-05-1951 DOA: 06/08/2022 DOS: the patient was seen and examined on 06/08/2022 PCP: Glean Hess, MD  Patient coming from: Home  Chief Complaint: No chief complaint on file.  HPI: Samantha Mason is a 72 y.o. female with medical history significant for Morbid obesity, chronic HFpEF,CKD stage III, chronic lower extremity lymphedema on treatment with torsemide, hypertension, asthma, stroke (1995) and obstructive sleep apnea(non compliant with cpap).  She presented to the emergency room accompanied by son with complaints of feeling unwell for the past 1 week.  She has been having chills but denied any fever.  She has also generally felt weak and fatigued.  She reports bilateral lower extremity swelling with associated redness.  Redness is more on the right eye compared to the left.  She had reported same to cardiology during the week and was advised to increase dose of Demadex to 60 mg.  She felt discomfort in her lower legs.  She also reports increasing weight gain over the same period.  She is having to use oxygen at all times during the day and also at night.  She admits to PND and orthopnea.  EMS was called to her home and was reported to have fever of 105F.  She denies dysuria or incontinence of urine.  Denies any diarrhea or abdominal pain.  In the ED, vitals were noted to be stable with some evidence of tachycardia.  Initial temperature reported to be 105.  This is trended down to 98.6 at the time of my evaluation.  Patient was initiated on sepsis protocol with IV fluids and antibiotics. She reports allergic reaction to penicillin, characterized by itching and hives. Workup in the ED including CT abdomen and pelvis were done.  Significant findings include an skin thickening and soft tissue edema of the proximal right thigh.  No obvious abscess when noted. Large bilateral inguinal and iliac lymph nodes were noted.  Diffuse  bronchial wall thickening was also noted.      Review of Systems: As mentioned in the history of present illness. All other systems reviewed and are negative. Past Medical History:  Diagnosis Date   Acute bronchitis    Anemia    Arthritis    Asthma    B12 deficiency 03/14/2017   Chest pain 07/02/2018   Chest pain    a. 06/2018 MV: EF 66%. No ischemia/infarct; b. 02/2021 PET CT: EF>60%, no ischemia/infarct.   Chronic heart failure with preserved ejection fraction (HFpEF) ([place])    a. 08/2020 Echo: EF 55-60%, GrI DD; b. 06/2021 Echo: EF 55-60%, mild LVH, nl RV fxn, no signif valvular dzs.   COPD (chronic obstructive pulmonary disease) ([place]) 07/24/2018   Family history of blood clots 07/16/2017   Irritable bowel syndrome    Malignant hypertension    Obesity    OSA (obstructive sleep apnea)    uses cpap   Pneumonia 2017   Restless leg syndrome    Stroke [facility]) 1995   mild   Past Surgical History:  Procedure Laterality Date   CHOLECYSTECTOMY     COLONOSCOPY WITH PROPOFOL N/A 03/31/2018   Procedure: COLONOSCOPY WITH PROPOFOL;  Surgeon: Lucilla Lame, MD;  Location: [facility] ENDOSCOPY;  Service: Endoscopy;  Laterality: N/A;   KNEE ARTHROPLASTY Right 04/30/2019   Procedure: COMPUTER ASSISTED TOTAL KNEE ARTHROPLASTY;  Surgeon: Dereck Leep, MD;  Location: [facility] ORS;  Service: Orthopedics;  Laterality: Right;   RIGHT HEART CATH N/A 01/30/2022  Procedure: RIGHT HEART CATH;  Surgeon: Nelva Bush, MD;  Location: [place] CV LAB;  Service: Cardiovascular;  Laterality: N/A;   saliva gland removal  1998   TOTAL KNEE ARTHROPLASTY Left 2014   TUBAL LIGATION Bilateral    Social History:  reports that she has never smoked. She has never used smokeless tobacco. She reports that she does not drink alcohol and does not use drugs.  Allergies  Allergen Reactions   Nuvigil [Armodafinil] Hives   Penicillins Diarrhea and Nausea And Vomiting    Did it involve swelling of the face/tongue/throat,  SOB, or low BP? no Did it involve sudden or severe rash/hives, skin peeling, or any reaction on the inside of your mouth or nose? No Did you need to seek medical attention at a hospital or doctor's office? No When did it last happen?  in her 29s    If all above answers are "NO", may proceed with cephalosporin use.    Spironolactone Itching   Gabapentin Other (See Comments)    "wiped her out, couldn't stay awake"   Melatonin Other (See Comments)    "Brain fog"   Provigil [Modafinil] Hives   Entresto [Sacubitril-Valsartan] Itching   Lisinopril Cough    Family History  Problem Relation Age of Onset   Dementia Mother    COPD Father        21   Diabetes Father    Heart failure Brother    Coronary artery disease Brother 48       CABG   Heart failure Maternal Grandmother    Stroke Maternal Grandmother    Breast cancer Neg Hx     Prior to Admission medications   Medication Sig Start Date End Date Taking? Authorizing Provider  acetaminophen (TYLENOL) 500 MG tablet Take 1,000 mg by mouth every 6 (six) hours as needed.   Yes [provider]  benzonatate (TESSALON) 100 MG capsule TAKE 1 CAPSULE BY MOUTH THREE TIMES A DAY AS NEEDED FOR COUGH 03/08/22  Yes Tyler Pita, MD  calcitRIOL (ROCALTROL) 0.25 MCG capsule Take 0.25 mcg by mouth daily. 02/22/22  Yes [provider]  dapagliflozin propanediol (FARXIGA) 10 MG TABS tablet Take 1 tablet (10 mg total) by mouth daily before breakfast. 03/07/22  Yes Hackney, Tina A, FNP  diltiazem (CARDIZEM CD) 240 MG 24 hr capsule Take 1 capsule (240 mg total) by mouth daily. 09/05/21  Yes Theora Gianotti, NP  ipratropium-albuterol (DUONEB) 0.5-2.5 (3) MG/3ML SOLN TAKE 3 MLS BY NEBULIZATION EVERY 6 (SIX) HOURS AS NEEDED. J45.40 03/04/22  Yes Tyler Pita, MD  metolazone (ZAROXOLYN) 2.5 MG tablet Take 1 tablet (2.5 mg total) by mouth 2 (two) times a week. Patient taking differently: Take 2.5 mg by mouth 2 (two) times a  week. Monday and Friday 02/04/22  Yes Wouk, Ailene Rud, MD  ondansetron (ZOFRAN) 4 MG tablet Take 1 tablet (4 mg total) by mouth daily as needed for nausea or vomiting. 05/10/22 05/10/23 Yes Lorella Nimrod, MD  pantoprazole (PROTONIX) 40 MG tablet TAKE 1 TABLET BY MOUTH EVERY DAY 12/11/21  Yes Glean Hess, MD  PREVALITE 4 g packet DISSOLVE AND DRINK 1 PACKET BY MOUTH AT BEDTIME 03/05/22  Yes Glean Hess, MD  rOPINIRole (REQUIP) 1 MG tablet Take 1 tablet (1 mg total) by mouth 3 (three) times daily. 02/13/22  Yes Glean Hess, MD  Torsemide 40 MG TABS Take 40 mg by mouth daily.   Yes [provider]  budesonide (PULMICORT) 0.5  MG/2ML nebulizer solution TAKE 2 ML (0.5 MG TOTAL) BY NEBULIZATION TWICE A DAY Patient not taking: Reported on 06/08/2022 10/29/21   Tyler Pita, MD  feeding supplement (ENSURE ENLIVE / ENSURE PLUS) LIQD Take 237 mLs by mouth 3 (three) times daily between meals. 05/10/22   Lorella Nimrod, MD  metoCLOPramide (REGLAN) 5 MG tablet Take 1 tablet (5 mg total) by mouth 3 (three) times daily before meals. Patient not taking: Reported on 06/08/2022 05/12/22   Sharen Hones, MD  NON FORMULARY Bipap - nightly with Oxygen 2 Lpm    [provider]  oxyCODONE 7.5 MG TABS Take 7.5 mg by mouth every 6 (six) hours as needed. 05/10/22 05/10/23  Lorella Nimrod, MD  OXYGEN Inhale into the lungs. 2 LPM [place]    [provider]  promethazine (PHENERGAN) 25 MG tablet Take 1 tablet (25 mg total) by mouth every 6 (six) hours as needed. Patient not taking: Reported on 06/08/2022 05/10/22 05/10/23  Lorella Nimrod, MD    Physical Exam: Vitals:   06/08/22 2005 06/08/22 2016 06/08/22 2110 06/08/22 2149  BP: (!) 107/36 (!) 108/40 (!) 122/40 (!) 111/49  Pulse: (!) 107  (!) 107 (!) 103  Resp: (!) 21  (!) 23 (!) 23  Temp: 98.6 F (37 C)     TempSrc: Oral     SpO2: 94%  92% 92%   Morbidly obese Caucasian female who was seen sitting up in chair at bedside.  She  feels more comfortable sitting upright.  She does not look lethargic. HEENT: Head is atraumatic.  Nasal cannula in situ.  Oral mucosa moist.  Neck Short and supple.  No obvious significant JVD. Chest: Diminished breath sounds bilaterally due to body habitus.  No added sounds were appreciated Cardiovascular: S1-S2 with no audible murmurs appreciated. Abdomen rotund with no palpable organomegaly. Extremities significant for bilateral lower extremity edema. Skin: Right upper thigh redness appreciated.  Data Reviewed: Sodium 137, potassium 3.5 chloride 05/29/1994 bicarb 29 glucose 123 BUN 39, creatinine 1.59, calcium 7.8 AST 17 ALT 8 total bilirubin 0.6.  WBC 12.9 hemoglobin 8.0 hematocrit 27.7 platelet count 221.  Neutrophil 87 left shift of 11.3.  Glucose 123  CT IMPRESSION: 1. Skin thickening and soft tissue edema of the partially imaged proximal right thigh. Correlate for cellulitis. 2. Newly enlarged bilateral inguinal and iliac lymph nodes, likely reactive. 3. Diffuse bilateral bronchial wall thickening and mosaic attenuation of the airspaces, consistent with nonspecific infectious or inflammatory bronchitis. 4. Cardiomegaly.  Assessment and Plan:  72 year old morbidly obese Caucasian female with past medical history significant for morbid obesity, HFpEF, CKD stage III, hypertension who presents with 1 week history of generalized ill feeling, chills and worsening lower extremity swelling.  Found to have bilateral lower extremity cellulitis with imminent sepsis.  Sepsis: Present on admission most likely secondary to cellulitis and possible pneumonia.  Characterized by tachycardia, fever, endorgan damage and foci of infection.  Patient was initiated on sepsis protocol in the ED.  Blood cultures obtained and pending.  Due to penicillin allergy, antibiotics will be revised to levofloxacin and vancomycin.  Chronic diastolic CHF: Patient looks mildly fluid overloaded.  IV fluids were initiated  for sepsis protocol.  Will resume diuretics in AM.  Daily weight monitoring advised.  Will monitor carefully for fluid overload.  Diuretics with furosemide IV will be initiated.  Daily weight monitoring.  Diet and fluid restriction advised.  Patient is on [place] at home.  Fingerstick glucose monitoring advised.  Acute bronchitis: As noted  on CT scan.  Patient was empirically initiated on antibiotics.  No reported coughing spells.  Respiratory panel was negative for COVID or flu.  Chronic kidney disease stage III: Stable  Chronic anemia: Likely due to anemia of chronic disease.  Will monitor H&H during the course of stay.  No indication for PRBC transfusion at this time.  Essential hypertension: Blood pressure stable.  Morbid obesity with BMI greater than 50.  She remains high risk of acute debility.  Physical therapy and Occupational Therapy will be ordered to follow-up with patient.  Sleep apnea: Noncompliant with CPAP at home.   Advance Care Planning:   Code Status: Full Code    Consults:   Family Communication: Son was updated regarding plan of care   Severity of Illness: The appropriate patient status for this patient is INPATIENT. Inpatient status is judged to be reasonable and necessary in order to provide the required intensity of service to ensure the patient's safety. The patient's presenting symptoms, physical exam findings, and initial radiographic and laboratory data in the context of their chronic comorbidities is felt to place them at high risk for further clinical deterioration. Furthermore, it is not anticipated that the patient will be medically stable for discharge from the hospital within 2 midnights of admission.   * I certify that at the point of admission it is my clinical judgment that the patient will require inpatient hospital care spanning beyond 2 midnights from the point of admission due to high intensity of service, high risk for further deterioration and high  frequency of surveillance required.*  Author: Artist Beach, MD 06/08/2022 11:21 PM  For on call review [email or web address].

## 2022-06-08 NOTE — Progress Notes (Signed)
PHARMACY -  BRIEF ANTIBIOTIC NOTE   Pharmacy has received consult(s) for Vancomycin from an ED provider.  The patient's profile has been reviewed for ht/wt/allergies/indication/available labs.    One time order(s) placed for Vancomycin 2500 mg IV X 1  Further antibiotics/pharmacy consults should be ordered by admitting physician if indicated.                       Thank you, Lovelle Deitrick D 06/08/2022  10:10 PM

## 2022-06-08 NOTE — Progress Notes (Signed)
Pharmacy Antibiotic Note  Samantha Mason is a 72 y.o. female admitted on 06/08/2022 with sepsis.  Pharmacy has been consulted for Vancomycin dosing.  Plan: Vancomycin 1 gm IV X 1 given in ED on 1/20 @ 2258.  Additional Vanc 1500 mg IV X 1 ordered to make total loading dose of 2500 mg.   Vancomycin 2250 mg IV Q48H ordered to start on 1/22 @ 2300.  AUC = 523.9 Vanc trough = 11.1      Temp (24hrs), Avg:101.8 F (38.8 C), Min:98.6 F (37 C), Max:105 F (40.6 C)  Recent Labs  Lab 06/08/22 1918 06/08/22 2109  WBC 12.9*  --   CREATININE 1.59*  --   LATICACIDVEN 1.1 0.9    Estimated Creatinine Clearance: 49 mL/min (A) (by C-G formula based on SCr of 1.59 mg/dL (H)).    Allergies  Allergen Reactions   Nuvigil [Armodafinil] Hives   Penicillins Diarrhea and Nausea And Vomiting    Did it involve swelling of the face/tongue/throat, SOB, or low BP? no Did it involve sudden or severe rash/hives, skin peeling, or any reaction on the inside of your mouth or nose? No Did you need to seek medical attention at a hospital or doctor's office? No When did it last happen?  in her 77s    If all above answers are "NO", may proceed with cephalosporin use.    Spironolactone Itching   Gabapentin Other (See Comments)    "wiped her out, couldn't stay awake"   Melatonin Other (See Comments)    "Brain fog"   Provigil [Modafinil] Hives   Entresto [Sacubitril-Valsartan] Itching   Lisinopril Cough    Antimicrobials this admission:   >>    >>   Dose adjustments this admission:   Microbiology results:  BCx:   UCx:    Sputum:    MRSA PCR:   Thank you for allowing pharmacy to be a part of this patient's care.  Cornisha Zetino D 06/08/2022 11:08 PM

## 2022-06-09 DIAGNOSIS — J209 Acute bronchitis, unspecified: Secondary | ICD-10-CM | POA: Diagnosis not present

## 2022-06-09 DIAGNOSIS — I5032 Chronic diastolic (congestive) heart failure: Secondary | ICD-10-CM

## 2022-06-09 DIAGNOSIS — N1832 Chronic kidney disease, stage 3b: Secondary | ICD-10-CM | POA: Diagnosis not present

## 2022-06-09 DIAGNOSIS — A419 Sepsis, unspecified organism: Secondary | ICD-10-CM | POA: Diagnosis not present

## 2022-06-09 LAB — COMPREHENSIVE METABOLIC PANEL
ALT: 10 U/L (ref 0–44)
AST: 17 U/L (ref 15–41)
Albumin: 2.5 g/dL — ABNORMAL LOW (ref 3.5–5.0)
Alkaline Phosphatase: 60 U/L (ref 38–126)
Anion gap: 8 (ref 5–15)
BUN: 43 mg/dL — ABNORMAL HIGH (ref 8–23)
CO2: 31 mmol/L (ref 22–32)
Calcium: 7.7 mg/dL — ABNORMAL LOW (ref 8.9–10.3)
Chloride: 98 mmol/L (ref 98–111)
Creatinine, Ser: 1.92 mg/dL — ABNORMAL HIGH (ref 0.44–1.00)
GFR, Estimated: 28 mL/min — ABNORMAL LOW (ref >60)
Glucose, Bld: 97 mg/dL (ref 70–99)
Potassium: 3.6 mmol/L (ref 3.5–5.1)
Sodium: 137 mmol/L (ref 135–145)
Total Bilirubin: 0.6 mg/dL (ref 0.3–1.2)
Total Protein: 7.2 g/dL (ref 6.5–8.1)

## 2022-06-09 LAB — CBC
HCT: 30.6 % — ABNORMAL LOW (ref 36.0–46.0)
Hemoglobin: 8.8 g/dL — ABNORMAL LOW (ref 12.0–15.0)
MCH: 24 pg — ABNORMAL LOW (ref 26.0–34.0)
MCHC: 28.8 g/dL — ABNORMAL LOW (ref 30.0–36.0)
MCV: 83.6 fL (ref 80.0–100.0)
Platelets: 257 10*3/uL (ref 150–400)
RBC: 3.66 MIL/uL — ABNORMAL LOW (ref 3.87–5.11)
RDW: 17.2 % — ABNORMAL HIGH (ref 11.5–15.5)
WBC: 11 10*3/uL — ABNORMAL HIGH (ref 4.0–10.5)
nRBC: 0 % (ref 0.0–0.2)

## 2022-06-09 LAB — PROCALCITONIN: Procalcitonin: 0.87 ng/mL

## 2022-06-09 LAB — HEMOGLOBIN A1C
Hgb A1c MFr Bld: 5.8 % — ABNORMAL HIGH (ref 4.8–5.6)
Mean Plasma Glucose: 119.76 mg/dL

## 2022-06-09 LAB — CORTISOL-AM, BLOOD: Cortisol - AM: 12.9 ug/dL (ref 6.7–22.6)

## 2022-06-09 MED ORDER — FUROSEMIDE 10 MG/ML IJ SOLN
40.0000 mg | Freq: Two times a day (BID) | INTRAMUSCULAR | Status: DC
  Administered 2022-06-09 – 2022-06-10 (×3): 40 mg via INTRAVENOUS
  Filled 2022-06-09 (×3): qty 4

## 2022-06-09 MED ORDER — SODIUM CHLORIDE 0.9 % IV SOLN
2.0000 g | Freq: Two times a day (BID) | INTRAVENOUS | Status: DC
  Administered 2022-06-09 – 2022-06-10 (×3): 2 g via INTRAVENOUS
  Filled 2022-06-09: qty 12.5
  Filled 2022-06-09: qty 2
  Filled 2022-06-09 (×2): qty 12.5

## 2022-06-09 MED ORDER — VANCOMYCIN HCL 750 MG/150ML IV SOLN
750.0000 mg | INTRAVENOUS | Status: DC
  Administered 2022-06-10: 750 mg via INTRAVENOUS
  Filled 2022-06-09: qty 150

## 2022-06-09 MED ORDER — DILTIAZEM HCL ER COATED BEADS 300 MG PO CP24
300.0000 mg | ORAL_CAPSULE | Freq: Every day | ORAL | Status: DC
  Filled 2022-06-09: qty 1

## 2022-06-09 NOTE — Progress Notes (Signed)
  Progress Note   Patient: LEMON WHITACRE GLO:[id number] DOB: Oct 16, 1950 DOA: 06/08/2022     1 DOS: the patient was seen and examined on 06/09/2022   Brief hospital course: 72 y.o. female with medical history significant for Morbid obesity, chronic HFpEF,CKD stage III, chronic lower extremity lymphedema on treatment with torsemide, hypertension, asthma, stroke (1995) and obstructive sleep apnea(non compliant with cpap) admitted for sepsis likely secondary to cellulitis.    1/21: Added cefepime, continue vancomycin, increase dose of Cardizem for better heart rate control  Assessment and Plan:  Sepsis: Present on admission most likely secondary to cellulitis as evidenced by by tachycardia, fever, endorgan damage and cellulitis.  Continue sepsis protocol -  Blood cultures obtained and pending.  Continue vancomycin, add cefepime.   Chronic diastolic CHF: Patient looks mildly fluid overloaded.  IV fluids were initiated for sepsis protocol.  Discontinue fluids and start diuretics - furosemide 40 mg IV twice daily.  Daily weight monitoring.  Diet and fluid restriction advised.  Patient is on [place] at home which I have held for now.  Fingerstick glucose monitoring advised.   Acute bronchitis: As noted on CT scan.  Continue empiric antibiotics.  No reported coughing spells.  Respiratory panel was negative for COVID or flu.   Chronic kidney disease stage IIIb: Stable   Anemia of chronic kidney disease: will monitor H&H during the course of stay.  No indication for PRBC transfusion at this time.   Essential hypertension: Blood pressure stable on Cardizem.  Will increase the dose of Cardizem for better heart rate control as heart rate running in 110s to 120s   Morbid obesity with BMI greater than 50.  She remains high risk of acute debility.  Physical therapy and Occupational Therapy evaluation pending   Sleep apnea: Noncompliant with CPAP at home.     Subjective: Reports shortness of  breath  Physical Exam: Vitals:   06/09/22 1300 06/09/22 1417 06/09/22 1418 06/09/22 1518  BP: (!) 124/97 (!) 153/70 (!) 153/70 (!) 137/58  Pulse: (!) 108 (!) 113 (!) 115 (!) 112  Resp: (!) '28 20 16   '$ Temp:  98.4 F (36.9 C) 98.4 F (36.9 C)   TempSrc:  Oral    SpO2: 97% 95% 94% 100%  Weight:      Height:       72 year old morbidly obese female lying in the bed comfortably without any acute distress Lungs decreased breath sounds at the bases, difficult evaluation due to body habitus Heart regular rate and rhythm Abdomen soft, obese, benign Neuro alert, awake, nonfocal Skin/extremities: 2+ bilateral lower extremity pedal edema.  Right upper thigh redness Data Reviewed:  Creatinine 1.92  Family Communication: None  Disposition: Status is: Inpatient Remains inpatient appropriate because: Management of fluid overload and suspected cellulitis  Planned Discharge Destination: Home with Home Health   DVT prophylaxis-heparin Time spent: 35 minutes  Author: Max Sane, MD 06/09/2022 4:21 PM  For on call review [email or web address].

## 2022-06-09 NOTE — Hospital Course (Addendum)
72 y.o. female with medical history significant for Morbid obesity, chronic HFpEF,CKD stage III, chronic lower extremity lymphedema on treatment with torsemide, hypertension, asthma, stroke (1995) and obstructive sleep apnea (non compliant with cpap) to ED w/ CC fatigue, LE swelling/redness. Pt was admitted. Course complicated by CO2 narcosis, failed BiPAP requiring intubation and mechanical ventilation.  Transferred to hospitalist service on 06/24/22. Treated for Acute on Chronic HFpEF. Her respiratory failure has been treated with BiPAP, diuresis, and bronchodilators. She has been refusing BiPAP now. Currently on 2L [place]. Diuresis held, Cr worsens despite this but has stabilized, nephrology following.   1/20: Presented to ED, admitted by [facility] with Sepsis in setting of lower extremity Cellulitis, AKI, and Acute on Chronic Hypercapnic Respiratory Failure in setting AECOPD.  1/21: Added cefepime, continue vancomycin, increase dose of Cardizem for better heart rate control 1/22: Had to reduce Cardizem dose back to her home dose as blood pressure dropped 1/23: Transferred to ICU overnight due to CO2 narcosis, placed on BiPAP.  PCCM consulted.  Worsening AKI, holding diuresis.  Agitation requiring Precedex, high risk for Intubation.  Palliative Care consulted for [place] conversations.  ABX broadened back to Cefepime and Vancomycin 1/24: Pt on Bipap but awake and able to follow commands. Attempt to transition off Bipap.  Palliative Care following 1/25: Pt currently on Bipap and precedex gtt.  Required 2.5 mg iv valium overnight for delirium.  Unable to obtain CT Head due to respiratory status.  Ultimately required intubation. 1/26: Remains critically ill. Issues with vent dyssynchrony and ventilation, plan to start Nimbex gtt. Creatinine improved with diuresis, continue with diuresis, Nephrology considering Lasix gtt. 1/27: ABG's improved on Nimbex, persistent cuff leak, exchanged ETT. Decadron started due to upper  airway swelling. Will attempt to d/c Nimbex. Good diuresis with Lasix gtt with 4.5 L of UOP last 24 hrs, Creatinine improving. 1/28: Continues with good diuresis on Lasix gtt, 3L of UOP over preceding 24 hrs, creatinine continues to improve.  Vent requirements slowly improving. 1/29: No acute events overnight. Remained mechanically intubated with minimal vent settings: PEEP 5/FiO2 40%.  Remains on lasix gtt @2mg$ /hr.  Not on sedation and attempting to follow commands but extremely weak.  Will perform SBT as tolerated   1/30: No acute events overnight.  Pt awake and following commands on precedex gtt @0$ .2 mcg/kg/hr.  Lasix gtt @4$  mg/hr. Tolerating SBT 15/8 1/31: No acute events, awake and following commands on low dose precedex.  Remains on Lasix gtt @ 13m/hr,  Creatinine remains stable BUN slowly uptrending. Tolerating SBT 15/8 ~ will continue to wean PSV as tolerated ~ EXTUBATED. Lasix gtt d/c following extubation.  Following extubation, pt confirmed DNR/DNI status in presence of her son at bedside. 2/1: Tolerated BiPAP overnight, [facility].  Speech evaluation performed, which pt FAILED. Consult PT/OT.  Creatinine continues to improve, started on D5W @ 50 ml/hr due to Hypernatremia of 155.  Holding diuresis today 2/2: Pt febrile currently on Bipap @50$ %.  Pt awake and following commands.  Failed speech evaluation on 02/1 and declining NGT placement.  2/2: Venous [place]: No evidence of deep venous thrombosis in either lower extremity.  2/3: During goals of care discussion pt agreeable to small bore NGT placement and dialysis if needed.  She confirmed DNR/DNI  2/4: Nursing staff reporting pt now tolerating ice chips without signs of aspiration.  Will contact speech therapy to evaluate pt again.  Dobhoff placement pending.  No acute events overnight  2/5: back to hospitalist service  2/6-2/9: Cr not improving but has been stable -  nephrology following, pt agreeable to temporary HD if needed,  nephro recs continue to monitor UOP. No acute need for dialysis. Continue to hold diuretics.    Lab Results  Component Value Date   CREATININE 3.27 (H) 06/29/2022   CREATININE 3.62 (H) 06/28/2022   CREATININE 3.59 (H) 06/27/2022    Consultants:  PCCU Nephrology  Palliative Care   Procedures: EEG 01/26 Art line insertion 01/26 CVC to L IJ 01/25 Intubation 01/25      ASSESSMENT & PLAN:   Principal Problem:   Severe sepsis ([place]) Active Problems:   Acute on chronic respiratory failure with hypoxia and hypercapnia (HCC)   Chronic diastolic CHF (congestive heart failure) (HCC)   Essential hypertension   Acute renal failure superimposed on stage 3b chronic kidney disease (HCC)   Morbid obesity with BMI of 50.0-59.9, adult (HCC)   OSA    History of stroke   Extreme obesity with alveolar hypoventilation (HCC)   Cellulitis   Acute metabolic encephalopathy   Community acquired pneumonia  Toxic Metabolic Encephalopathy likely CO2 narcosis - resolved Mental status improved with improvement in her CO2 levels and resolution of CO2 narcosis and metabolic derangements.   Acute on Chronic Kidney Injury likely secondary to severe infection/sepsis on admission, was improving then worse again, concern for volume contraction / diuresis effect  Hypernatremia - resolved Contraction Alkalosis - resolved  holding diuretics, holding IVF management per nephrology - there was some concern about possible needing dialysis but she has been stable for past few days  Request recs from nephrology re: goals/parameters for renal function which would force decision on dialysis, vs goals/parameters which would reassure for outpatient follow-up and d/c to SNF?  Follow BMP  Acute on Chronic Hypoxic and Hypercapnic Respiratory Failure D/t Acute on Chronic HFpEF supplemental O2 to keep sats >=90%, wean as tolerated palliative care involved DNR/DNI  continue supportive care with bronchodilators  Acute  on Chronic HFpEF Holding diuresis given contraction, hypernatremia, and renal failure.   Anemia of Chronic Disease Monitor CBC Aranesp weekly   Hypokalemia monitor and replete PRN       DVT prophylaxis: heparin sq Pertinent IV fluids/nutrition: none Central lines / invasive devices: none  Code Status: DNR  Current Admission Status: inpatient   TOC needs / Dispo plan: SN[facility] patient  Barriers to discharge / significant pending items: renal function precludes discharge

## 2022-06-09 NOTE — ED Notes (Signed)
Advised nurse that patient has ready bed 

## 2022-06-09 NOTE — ED Notes (Signed)
Patient repositioned in recliner for comfort.  Patient provided with warm blankets and pillows.  Socks with grips placed on feet per request

## 2022-06-09 NOTE — Progress Notes (Signed)
Pharmacy Antibiotic Note  Samantha Mason is a 72 y.o. female admitted on 06/08/2022 with sepsis.  Pharmacy has been consulted for Vancomycin dosing.  Plan: Pt received vancomycin 2500 mg x 1 in the ED. Pt was ordered vancomycin 2250 mg q48H. Will adjust vancomycin 750 mg q24H. Predicted AUC of 408. Goal AUC of 400-600. Scr 1.92 (baseline), Vd 0.5, IBW. Plan to order vancomycin level after 4th or 5th dose.   Continue cefepime 2g q12H.      Temp (24hrs), Avg:100.4 F (38 C), Min:97.7 F (36.5 C), Max:105 F (40.6 C)  Recent Labs  Lab 06/08/22 1918 06/08/22 2109 06/09/22 0716  WBC 12.9*  --  11.0*  CREATININE 1.59*  --  1.92*  LATICACIDVEN 1.1 0.9  --      Estimated Creatinine Clearance: 40.5 mL/min (A) (by C-G formula based on SCr of 1.92 mg/dL (H)).    Allergies  Allergen Reactions   Nuvigil [Armodafinil] Hives   Penicillins Diarrhea and Nausea And Vomiting    Did it involve swelling of the face/tongue/throat, SOB, or low BP? no Did it involve sudden or severe rash/hives, skin peeling, or any reaction on the inside of your mouth or nose? No Did you need to seek medical attention at a hospital or doctor's office? No When did it last happen?  in her 26s    If all above answers are "NO", may proceed with cephalosporin use.    Spironolactone Itching   Gabapentin Other (See Comments)    "wiped her out, couldn't stay awake"   Melatonin Other (See Comments)    "Brain fog"   Provigil [Modafinil] Hives   Entresto [Sacubitril-Valsartan] Itching   Lisinopril Cough      Microbiology results: 1/20 BCx: pending 1/20 UCx: pending  Thank you for allowing pharmacy to be a part of this patient's care.  Oswald Hillock, PharmD, BCPS 06/09/2022 9:19 AM

## 2022-06-09 NOTE — ED Notes (Signed)
Patient repositioned for comfort.  Legs elevated

## 2022-06-09 NOTE — ED Notes (Signed)
Patient medicated and morning labs drawn. Patient updated on plan of care. Patient requesting warm blankets. Patient not running a fever at this time 97.32F, warm blankets provided. Patient resting in chair comfortably in bed.

## 2022-06-10 ENCOUNTER — Ambulatory Visit: Payer: Medicare Other | Admitting: Medical

## 2022-06-10 ENCOUNTER — Telehealth: Payer: Self-pay | Admitting: Internal Medicine

## 2022-06-10 ENCOUNTER — Encounter: Payer: Medicare Other | Admitting: Internal Medicine

## 2022-06-10 DIAGNOSIS — A419 Sepsis, unspecified organism: Secondary | ICD-10-CM | POA: Diagnosis not present

## 2022-06-10 DIAGNOSIS — N189 Chronic kidney disease, unspecified: Secondary | ICD-10-CM | POA: Diagnosis not present

## 2022-06-10 DIAGNOSIS — I5032 Chronic diastolic (congestive) heart failure: Secondary | ICD-10-CM | POA: Diagnosis not present

## 2022-06-10 DIAGNOSIS — Z862 Personal history of diseases of the blood and blood-forming organs and certain disorders involving the immune mechanism: Secondary | ICD-10-CM

## 2022-06-10 DIAGNOSIS — J209 Acute bronchitis, unspecified: Secondary | ICD-10-CM | POA: Diagnosis not present

## 2022-06-10 LAB — URINE CULTURE: Culture: NO GROWTH

## 2022-06-10 LAB — BLOOD GAS, ARTERIAL
Acid-base deficit: 2.2 mmol/L — ABNORMAL HIGH (ref 0.0–2.0)
Bicarbonate: 30.8 mmol/L — ABNORMAL HIGH (ref 20.0–28.0)
O2 Content: 3 L/min
O2 Saturation: 98.1 %
Patient temperature: 37
pCO2 arterial: 104 mmHg (ref 32–48)
pH, Arterial: 7.08 — CL (ref 7.35–7.45)
pO2, Arterial: 90 mmHg (ref 83–108)

## 2022-06-10 LAB — BASIC METABOLIC PANEL
Anion gap: 14 (ref 5–15)
BUN: 45 mg/dL — ABNORMAL HIGH (ref 8–23)
CO2: 24 mmol/L (ref 22–32)
Calcium: 8.1 mg/dL — ABNORMAL LOW (ref 8.9–10.3)
Chloride: 99 mmol/L (ref 98–111)
Creatinine, Ser: 2.1 mg/dL — ABNORMAL HIGH (ref 0.44–1.00)
GFR, Estimated: 25 mL/min — ABNORMAL LOW (ref >60)
Glucose, Bld: 102 mg/dL — ABNORMAL HIGH (ref 70–99)
Potassium: 4.1 mmol/L (ref 3.5–5.1)
Sodium: 137 mmol/L (ref 135–145)

## 2022-06-10 LAB — CBC
HCT: 30.1 % — ABNORMAL LOW (ref 36.0–46.0)
Hemoglobin: 8.6 g/dL — ABNORMAL LOW (ref 12.0–15.0)
MCH: 24 pg — ABNORMAL LOW (ref 26.0–34.0)
MCHC: 28.6 g/dL — ABNORMAL LOW (ref 30.0–36.0)
MCV: 83.8 fL (ref 80.0–100.0)
Platelets: UNDETERMINED 10*3/uL (ref 150–400)
RBC: 3.59 MIL/uL — ABNORMAL LOW (ref 3.87–5.11)
RDW: 17.2 % — ABNORMAL HIGH (ref 11.5–15.5)
WBC: 10.8 10*3/uL — ABNORMAL HIGH (ref 4.0–10.5)
nRBC: 0 % (ref 0.0–0.2)

## 2022-06-10 LAB — GLUCOSE, CAPILLARY
Glucose-Capillary: 100 mg/dL — ABNORMAL HIGH (ref 70–99)
Glucose-Capillary: 88 mg/dL (ref 70–99)

## 2022-06-10 MED ORDER — OXYCODONE HCL 5 MG PO TABS
5.0000 mg | ORAL_TABLET | Freq: Four times a day (QID) | ORAL | Status: DC | PRN
  Administered 2022-06-10: 5 mg via ORAL
  Filled 2022-06-10 (×2): qty 1

## 2022-06-10 MED ORDER — CEPHALEXIN 500 MG PO CAPS
500.0000 mg | ORAL_CAPSULE | Freq: Four times a day (QID) | ORAL | Status: DC
  Filled 2022-06-10 (×4): qty 1

## 2022-06-10 MED ORDER — DILTIAZEM HCL ER COATED BEADS 240 MG PO CP24
240.0000 mg | ORAL_CAPSULE | Freq: Every day | ORAL | Status: DC
  Filled 2022-06-10 (×6): qty 1

## 2022-06-10 MED ORDER — CHLORHEXIDINE GLUCONATE CLOTH 2 % EX PADS
6.0000 | MEDICATED_PAD | Freq: Every day | CUTANEOUS | Status: DC
  Administered 2022-06-12 – 2022-06-26 (×15): 6 via TOPICAL

## 2022-06-10 NOTE — Progress Notes (Addendum)
  Progress Note   Patient: Samantha Mason OYD:[id number] DOB: 07/13/1950 DOA: 06/08/2022     2 DOS: the patient was seen and examined on 06/10/2022   Brief hospital course: 72 y.o. female with medical history significant for Morbid obesity, chronic HFpEF,CKD stage III, chronic lower extremity lymphedema on treatment with torsemide, hypertension, asthma, stroke (1995) and obstructive sleep apnea(non compliant with cpap) admitted for sepsis likely secondary to cellulitis.    1/21: Added cefepime, continue vancomycin, increase dose of Cardizem for better heart rate control 1/22: Had to reduce Cardizem dose back to her home dose as blood pressure dropped  Assessment and Plan:  Sepsis: Present on admission most likely secondary to cellulitis as evidenced by by tachycardia, fever, endorgan damage and cellulitis.  Continue sepsis protocol -  Blood cultures negative thus far.  Continue vancomycin and cefepime.   Chronic diastolic CHF: Patient looks mildly fluid overloaded.  IV fluids were initiated for sepsis protocol.  Discontinued fluids and started diuretics yesterday- furosemide 40 mg IV twice daily.  Daily weight monitoring.  Diet and fluid restriction advised.  Patient is on [place] at home which I have held for now.  Fingerstick glucose monitoring advised.   Acute bronchitis: As noted on CT scan.  Continue empiric antibiotics.  No reported coughing spells.  Respiratory panel was negative for COVID or flu.   Chronic kidney disease stage IIIb: Stable   Anemia of chronic kidney disease: will monitor H&H during the course of stay.  No indication for PRBC transfusion at this time.  Hemoglobin 8.6   Essential hypertension: Blood pressure stable on Cardizem.  Heart rate much better controlled but blood pressure dropped.  Will reduce the dose of Cardizem back to 240 mg daily   Morbid obesity with BMI greater than 50.  She remains high risk of acute debility.  Physical therapy and Occupational Therapy  evaluation pending   Sleep apnea: Noncompliant with CPAP at home.      Subjective: Gets short of breath very easily on minimal exertion  Physical Exam: Vitals:   06/10/22 0120 06/10/22 0320 06/10/22 0713 06/10/22 0831  BP: 130/70 107/75 (!) 134/57 91/75  Pulse: 97 88 89 100  Resp: (!) 22 (!) '22 20 18  '$ Temp: 98.3 F (36.8 C) 98 F (36.7 C) 99.8 F (37.7 C) 99 F (37.2 C)  TempSrc: Oral Oral Oral   SpO2: 99% 99% 98% 98%  Weight:      Height:       72 year old morbidly obese female lying in the bed comfortably without any acute distress Lungs decreased breath sounds at the bases, difficult evaluation due to body habitus Heart regular rate and rhythm Abdomen soft, obese, benign Neuro alert, awake, nonfocal Skin/extremities: 2+ bilateral lower extremity pedal edema.  Right upper thigh redness improving Data Reviewed:  Creatinine 2.10  Family Communication: Son [name] is updated  Disposition: Status is: Inpatient Remains inpatient appropriate because: Management of cellulitis and fluid overload  Planned Discharge Destination: Home with Home Health   DVT prophylaxis-heparin Time spent: 35 minutes  Author: Max Sane, MD 06/10/2022 3:01 PM  For on call review [email or web address].

## 2022-06-10 NOTE — Progress Notes (Deleted)
Cardiology Office Note:    Date:  06/10/2022   ID:  Samantha Mason, DOB Dec 08, 1950, MRN [id number]  PCP:  Glean Hess, MD  [facility] Cardiologist:  Nelva Bush, MD  [facility] Electrophysiologist:  None   Referring MD: Glean Hess, MD   Chief Complaint: ***  History of Present Illness:    Samantha Mason is a 72 y.o. female with a hx of ***  Past Medical History:  Diagnosis Date   Acute bronchitis    Anemia    Arthritis    Asthma    B12 deficiency 03/14/2017   Chest pain 07/02/2018   Chest pain    a. 06/2018 MV: EF 66%. No ischemia/infarct; b. 02/2021 PET CT: EF>60%, no ischemia/infarct.   Chronic heart failure with preserved ejection fraction (HFpEF) ([place])    a. 08/2020 Echo: EF 55-60%, GrI DD; b. 06/2021 Echo: EF 55-60%, mild LVH, nl RV fxn, no signif valvular dzs.   COPD (chronic obstructive pulmonary disease) ([place]) 07/24/2018   Family history of blood clots 07/16/2017   Irritable bowel syndrome    Malignant hypertension    Obesity    OSA (obstructive sleep apnea)    uses cpap   Pneumonia 2017   Restless leg syndrome    Stroke [facility]) 1995   mild    Past Surgical History:  Procedure Laterality Date   CHOLECYSTECTOMY     COLONOSCOPY WITH PROPOFOL N/A 03/31/2018   Procedure: COLONOSCOPY WITH PROPOFOL;  Surgeon: Lucilla Lame, MD;  Location: [facility] ENDOSCOPY;  Service: Endoscopy;  Laterality: N/A;   KNEE ARTHROPLASTY Right 04/30/2019   Procedure: COMPUTER ASSISTED TOTAL KNEE ARTHROPLASTY;  Surgeon: Dereck Leep, MD;  Location: [facility] ORS;  Service: Orthopedics;  Laterality: Right;   RIGHT HEART CATH N/A 01/30/2022   Procedure: RIGHT HEART CATH;  Surgeon: Nelva Bush, MD;  Location: [place] CV LAB;  Service: Cardiovascular;  Laterality: N/A;   saliva gland removal  1998   TOTAL KNEE ARTHROPLASTY Left 2014   TUBAL LIGATION Bilateral     Current Medications: No outpatient medications have been marked as taking for the 06/10/22  encounter (Appointment) with Kathlen Mody, Alveria Mcglaughlin H, PA-C.     Allergies:   Nuvigil [armodafinil], Penicillins, Spironolactone, Gabapentin, Melatonin, Provigil [modafinil], Entresto [sacubitril-valsartan], and Lisinopril   Social History   Socioeconomic History   Marital status: Widowed    Spouse name: Not on file   Number of children: 2   Years of education: Not on file   Highest education level: Not on file  Occupational History   Not on file  Tobacco Use   Smoking status: Never   Smokeless tobacco: Never  Vaping Use   Vaping Use: Never used  Substance and Sexual Activity   Alcohol use: No    Alcohol/week: 0.0 standard drinks of alcohol   Drug use: No   Sexual activity: Never  Other Topics Concern   Not on file  Social History Narrative   Pt lives with her son   Social Determinants of Health   Financial Resource Strain: Medium Risk (08/14/2021)   Overall Financial Resource Strain (CARDIA)    Difficulty of Paying Living Expenses: Somewhat hard  Food Insecurity: No Food Insecurity (06/09/2022)   Hunger Vital Sign    Worried About Running Out of Food in the Last Year: Never true    [place] in the Last Year: Never true  Transportation Needs: No Transportation Needs (06/09/2022)   PRAPARE - Transportation  Lack of Transportation (Medical): No    Lack of Transportation (Non-Medical): No  Physical Activity: Inactive (08/08/2021)   Exercise Vital Sign    Days of Exercise per Week: 0 days    Minutes of Exercise per Session: 0 min  Stress: No Stress Concern Present (08/08/2021)   [place]    Feeling of Stress : Only a little  Social Connections: Socially Isolated (08/08/2021)   Social Connection and Isolation Panel [NHANES]    Frequency of Communication with Friends and Family: Once a week    Frequency of Social Gatherings with Friends and Family: Never    Attends Religious Services: Never    Building surveyor or Organizations: No    Attends Archivist Meetings: Never    Marital Status: Widowed     Family History: The patient's ***family history includes COPD in her father; Coronary artery disease (age of onset: 1) in her brother; Dementia in her mother; Diabetes in her father; Heart failure in her brother and maternal grandmother; Stroke in her maternal grandmother. There is no history of Breast cancer.  ROS:   Please see the history of present illness.    *** All other systems reviewed and are negative.  EKGs/Labs/Other Studies Reviewed:    The following studies were reviewed today: ***  EKG:  EKG is *** ordered today.  The ekg ordered today demonstrates ***  Recent Labs: 01/23/2022: B Natriuretic Peptide 19.6 05/13/2022: Magnesium 1.8 06/09/2022: ALT 10 06/10/2022: BUN 45; Creatinine, Ser 2.10; Hemoglobin 8.6; Platelets PLATELET CLUMPS NOTED ON SMEAR, UNABLE TO ESTIMATE; Potassium 4.1; Sodium 137  Recent Lipid Panel    Component Value Date/Time   CHOL 160 07/16/2021 0446   TRIG 84 05/09/2022 0621   HDL 81 07/16/2021 0446   CHOLHDL 2.0 07/16/2021 0446   VLDL 11 07/16/2021 0446   LDLCALC 68 07/16/2021 0446     Risk Assessment/Calculations:   {Does this patient have ATRIAL FIBRILLATION?:[phone]}   Physical Exam:    VS:  There were no vitals taken for this visit.    Wt Readings from Last 3 Encounters:  06/09/22 (!) 349 lb (158.3 kg)  05/28/22 (!) 338 lb 8 oz (153.5 kg)  05/13/22 (!) 326 lb (147.9 kg)     GEN: *** Well nourished, well developed in no acute distress HEENT: Normal NECK: No JVD; No carotid bruits LYMPHATICS: No lymphadenopathy CARDIAC: ***RRR, no murmurs, rubs, gallops RESPIRATORY:  Clear to auscultation without rales, wheezing or rhonchi  ABDOMEN: Soft, non-tender, non-distended MUSCULOSKELETAL:  No edema; No deformity  SKIN: Warm and dry NEUROLOGIC:  Alert and oriented x 3 PSYCHIATRIC:  Normal affect   ASSESSMENT:    No  diagnosis found. PLAN:    In order of problems listed above:  ***  Disposition: Follow up {follow up:15908} with ***   Shared Decision Making/Informed Consent   {Are you ordering a CV Procedure (e.g. stress test, cath, DCCV, TEE, etc)?   Press F2        :[id number]    Signed, Genasis Zingale Arlyss Repress  06/10/2022 8:26 AM    [facility]

## 2022-06-10 NOTE — Telephone Encounter (Signed)
Spoke to [employer] gave her verbal orders. She verbalized understanding.  KP

## 2022-06-10 NOTE — Progress Notes (Signed)
CROSS COVER NOTE  NAME: Samantha Mason MRN: [id number] DOB : 01/22/1951 ATTENDING PHYSICIAN: Max Sane, MD    Date of Service   06/10/2022   HPI/Events of Note   Message received from RN reporting Samantha Mason has been sleeping since this afternoon and is difficult to arouse. She is able to follow commands and vital signs are stable. At bedside Samantha Mason arouses to verbal stimuli, does not answer orientation questions, but is able to follow commands. No focal deficits noted.   Interventions   Assessment/Plan:  Altered Mental Status secondary to Acute on Chronic Respiratory Failure with Hypercarbia ABG- pH 7.08 pCO2 104 pO2 90 Bicarb 30.8 Bipap        To reach the provider On-Call:   7AM- 7PM see care teams to locate the attending and reach out to them via [email or web address] Password: [email or web address] 7PM-7AM contact night-coverage If you still have difficulty reaching the appropriate provider, please page the [facility] (Director on Call) for [facility] on amion for assistance  This document was prepared using Systems analyst and may include unintentional dictation errors.  Samantha Glass DNP, MBA, FNP-BC Nurse Practitioner [facility] Pager [phone]

## 2022-06-10 NOTE — Telephone Encounter (Signed)
Home Health Verbal Orders - Caller/Agency: [facility] Number: [phone] opt 2 Requesting: verbal orders Frequency: 1 x 5 1 every other week for 4 weeks

## 2022-06-11 ENCOUNTER — Encounter: Payer: Self-pay | Admitting: Pulmonary Disease

## 2022-06-11 DIAGNOSIS — Z515 Encounter for palliative care: Secondary | ICD-10-CM | POA: Diagnosis not present

## 2022-06-11 DIAGNOSIS — J9602 Acute respiratory failure with hypercapnia: Secondary | ICD-10-CM

## 2022-06-11 DIAGNOSIS — L0390 Cellulitis, unspecified: Secondary | ICD-10-CM | POA: Diagnosis not present

## 2022-06-11 DIAGNOSIS — G9341 Metabolic encephalopathy: Secondary | ICD-10-CM

## 2022-06-11 DIAGNOSIS — I5032 Chronic diastolic (congestive) heart failure: Secondary | ICD-10-CM | POA: Diagnosis not present

## 2022-06-11 DIAGNOSIS — J189 Pneumonia, unspecified organism: Secondary | ICD-10-CM | POA: Diagnosis not present

## 2022-06-11 DIAGNOSIS — J9620 Acute and chronic respiratory failure, unspecified whether with hypoxia or hypercapnia: Secondary | ICD-10-CM | POA: Diagnosis not present

## 2022-06-11 DIAGNOSIS — A419 Sepsis, unspecified organism: Secondary | ICD-10-CM | POA: Diagnosis not present

## 2022-06-11 LAB — BLOOD GAS, ARTERIAL
Acid-Base Excess: 1.4 mmol/L (ref 0.0–2.0)
Acid-Base Excess: 1.2 mmol/L (ref 0.0–2.0)
Bicarbonate: 32.8 mmol/L — ABNORMAL HIGH (ref 20.0–28.0)
Bicarbonate: 29.8 mmol/L — ABNORMAL HIGH (ref 20.0–28.0)
Delivery systems: POSITIVE
Delivery systems: POSITIVE
Expiratory PAP: 6 cmH2O
Expiratory PAP: 6 cmH2O
FIO2: 45 %
FIO2: 40 %
Inspiratory PAP: 16 cmH2O
Inspiratory PAP: 18 cmH2O
O2 Saturation: 99.3 %
O2 Saturation: 98.8 %
Patient temperature: 37
Patient temperature: 37
RATE: 18 resp/min
pCO2 arterial: 90 mmHg (ref 32–48)
pCO2 arterial: 65 mmHg — ABNORMAL HIGH (ref 32–48)
pH, Arterial: 7.17 — CL (ref 7.35–7.45)
pH, Arterial: 7.27 — ABNORMAL LOW (ref 7.35–7.45)
pO2, Arterial: 119 mmHg — ABNORMAL HIGH (ref 83–108)
pO2, Arterial: 95 mmHg (ref 83–108)

## 2022-06-11 LAB — CBC
HCT: 29.5 % — ABNORMAL LOW (ref 36.0–46.0)
Hemoglobin: 8.2 g/dL — ABNORMAL LOW (ref 12.0–15.0)
MCH: 24.1 pg — ABNORMAL LOW (ref 26.0–34.0)
MCHC: 27.8 g/dL — ABNORMAL LOW (ref 30.0–36.0)
MCV: 86.8 fL (ref 80.0–100.0)
Platelets: 250 10*3/uL (ref 150–400)
RBC: 3.4 MIL/uL — ABNORMAL LOW (ref 3.87–5.11)
RDW: 17.2 % — ABNORMAL HIGH (ref 11.5–15.5)
WBC: 11.4 10*3/uL — ABNORMAL HIGH (ref 4.0–10.5)
nRBC: 0.2 % (ref 0.0–0.2)

## 2022-06-11 LAB — BASIC METABOLIC PANEL
Anion gap: 11 (ref 5–15)
BUN: 52 mg/dL — ABNORMAL HIGH (ref 8–23)
CO2: 29 mmol/L (ref 22–32)
Calcium: 8.3 mg/dL — ABNORMAL LOW (ref 8.9–10.3)
Chloride: 99 mmol/L (ref 98–111)
Creatinine, Ser: 2.71 mg/dL — ABNORMAL HIGH (ref 0.44–1.00)
GFR, Estimated: 18 mL/min — ABNORMAL LOW (ref >60)
Glucose, Bld: 85 mg/dL (ref 70–99)
Potassium: 4.4 mmol/L (ref 3.5–5.1)
Sodium: 139 mmol/L (ref 135–145)

## 2022-06-11 LAB — MRSA NEXT GEN BY PCR, NASAL: MRSA by PCR Next Gen: NOT DETECTED

## 2022-06-11 MED ORDER — MORPHINE SULFATE (PF) 2 MG/ML IV SOLN
2.0000 mg | INTRAVENOUS | Status: DC | PRN
  Administered 2022-06-12 – 2022-06-13 (×5): 2 mg via INTRAVENOUS
  Filled 2022-06-11 (×5): qty 1

## 2022-06-11 MED ORDER — DEXMEDETOMIDINE HCL IN NACL 400 MCG/100ML IV SOLN
0.0000 ug/kg/h | INTRAVENOUS | Status: DC
  Administered 2022-06-11: 1 ug/kg/h via INTRAVENOUS
  Administered 2022-06-11: 0.3 ug/kg/h via INTRAVENOUS
  Administered 2022-06-12: 1.2 ug/kg/h via INTRAVENOUS
  Administered 2022-06-12: 0.8 ug/kg/h via INTRAVENOUS
  Administered 2022-06-12: 0.6 ug/kg/h via INTRAVENOUS
  Administered 2022-06-12 (×3): 1.2 ug/kg/h via INTRAVENOUS
  Administered 2022-06-12: 0.2 ug/kg/h via INTRAVENOUS
  Administered 2022-06-13: 1.2 ug/kg/h via INTRAVENOUS
  Administered 2022-06-13: 0.4 ug/kg/h via INTRAVENOUS
  Administered 2022-06-13 (×3): 1.2 ug/kg/h via INTRAVENOUS
  Filled 2022-06-11: qty 100
  Filled 2022-06-11: qty 200
  Filled 2022-06-11 (×11): qty 100

## 2022-06-11 MED ORDER — DIAZEPAM 5 MG/ML IJ SOLN: 5.0000 mg | Freq: Once | INTRAMUSCULAR | Status: DC

## 2022-06-11 MED ORDER — MORPHINE SULFATE (PF) 2 MG/ML IV SOLN
1.0000 mg | Freq: Once | INTRAVENOUS | Status: AC
  Administered 2022-06-11: 1 mg via INTRAVENOUS
  Filled 2022-06-11: qty 1

## 2022-06-11 MED ORDER — BUDESONIDE 0.5 MG/2ML IN SUSP
0.5000 mg | Freq: Two times a day (BID) | RESPIRATORY_TRACT | Status: DC
  Administered 2022-06-11 – 2022-07-08 (×56): 0.5 mg via RESPIRATORY_TRACT
  Filled 2022-06-11 (×57): qty 2

## 2022-06-11 MED ORDER — METHYLPREDNISOLONE SODIUM SUCC 40 MG IJ SOLR
20.0000 mg | Freq: Two times a day (BID) | INTRAMUSCULAR | Status: DC
  Administered 2022-06-11 – 2022-06-12 (×4): 20 mg via INTRAVENOUS
  Filled 2022-06-11 (×4): qty 1

## 2022-06-11 MED ORDER — SODIUM CHLORIDE 0.9 % IV SOLN
2.0000 g | Freq: Two times a day (BID) | INTRAVENOUS | Status: DC
  Administered 2022-06-11 – 2022-06-12 (×3): 2 g via INTRAVENOUS
  Filled 2022-06-11: qty 2
  Filled 2022-06-11 (×3): qty 12.5

## 2022-06-11 MED ORDER — VANCOMYCIN HCL 2000 MG/400ML IV SOLN
2000.0000 mg | Freq: Once | INTRAVENOUS | Status: AC
  Administered 2022-06-11: 2000 mg via INTRAVENOUS
  Filled 2022-06-11: qty 400

## 2022-06-11 MED ORDER — MORPHINE SULFATE (PF) 2 MG/ML IV SOLN
2.0000 mg | Freq: Once | INTRAVENOUS | Status: AC
  Administered 2022-06-11: 2 mg via INTRAVENOUS
  Filled 2022-06-11: qty 1

## 2022-06-11 MED ORDER — IPRATROPIUM-ALBUTEROL 0.5-2.5 (3) MG/3ML IN SOLN
3.0000 mL | Freq: Four times a day (QID) | RESPIRATORY_TRACT | Status: DC
  Administered 2022-06-11 – 2022-06-14 (×13): 3 mL via RESPIRATORY_TRACT
  Filled 2022-06-11 (×13): qty 3

## 2022-06-11 NOTE — Consult Note (Signed)
NAME:  Samantha Mason, MRN:  [id number], DOB:  22-Apr-1951, LOS: 3 ADMISSION DATE:  06/08/2022, CONSULTATION DATE:  06/11/2022 REFERRING MD:  Dr. Manuella Ghazi, CHIEF COMPLAINT:  Altered Mental Status, Hypercapnia  Brief Pt Description / Synopsis:  72 y.o. female with PMHx significant for HFpEF, COPD, OSA (noncompliant with CPAP), CKD Stage III who is  admitted with Sepsis in setting of lower extremity Cellulitis, AKI, and  Acute on Chronic Hypercapnic Respiratory Failure in setting AECOPD.  Course complicated by CO2 narcosis requiring BiPAP, high risk for intubation.  History of Present Illness:  Samantha Mason is a 72 y.o. female with medical history significant for Morbid obesity, chronic HFpEF,CKD stage III, chronic lower extremity lymphedema on treatment with torsemide, hypertension, asthma, stroke (1995) and obstructive sleep apnea(non compliant with cpap) who presented to [facility] ED on 06/08/22 due to shortness of breath and generalized malaise for appropriately 1 week.  Pt is currently altered and no family currently available, therefore history is obtained from chart review.  Per ED and nursing notes, she reported about a 1 week history of feeling weak and fatigued, chills, bilateral lower extremity swelling and erythema (R>L), paroxsymal nocturnal dyspnea and orthopnea, weight gain with increased oxygen requirements.  She denied abdominal pain, N/V/D, dysuria.  ED Course: Initial Vital Signs: 105F orally, BP 129/53, HR 109, RR 27, SpO2 96% Significant Labs: BUN 39, Creatinine 1.59, Albumin 2.4, lactic acid 1.1, WBC 12.9, Hgb 8.0, Hct 27.7, Hgb A1c 5.8 COVID/FLU/RSV negative UA negative for UTI Imaging Chest X-ray>>IMPRESSION: Mild cardiomegaly with central pulmonary vascular congestion. CT Chest/Abdomen/Pelvis>> IMPRESSION: 1. Skin thickening and soft tissue edema of the partially imaged proximal right thigh. Correlate for cellulitis. 2. Newly enlarged bilateral inguinal and iliac lymph  nodes, likely reactive. 3. Diffuse bilateral bronchial wall thickening and mosaic attenuation of the airspaces, consistent with nonspecific infectious or inflammatory bronchitis. 4. Cardiomegaly.  She met Sepsis criteria, therefore she received IV fluids and antibiotics. Hospitalists were asked to admit for further workup and treatment os sepsis due to cellulitis.  Please see "significant hospital events" section below for full detailed hospital course.  Pertinent  Medical History   Past Medical History:  Diagnosis Date   Acute bronchitis    Anemia    Arthritis    Asthma    B12 deficiency 03/14/2017   Chest pain 07/02/2018   Chest pain    a. 06/2018 MV: EF 66%. No ischemia/infarct; b. 02/2021 PET CT: EF>60%, no ischemia/infarct.   Chronic heart failure with preserved ejection fraction (HFpEF) ([place])    a. 08/2020 Echo: EF 55-60%, GrI DD; b. 06/2021 Echo: EF 55-60%, mild LVH, nl RV fxn, no signif valvular dzs.   COPD (chronic obstructive pulmonary disease) ([place]) 07/24/2018   Family history of blood clots 07/16/2017   Irritable bowel syndrome    Malignant hypertension    Obesity    OSA (obstructive sleep apnea)    uses cpap   Pneumonia 2017   Restless leg syndrome    Stroke [facility]) 1995   mild     Micro Data:  1/20: SARS-CoV-2/influenza/RSV PCR>> negative 1/20: Blood culture x 2>> no growth to date 1/20: Urine>> negative 1/22: MRSA PCR>> 1/22: Respiratory viral panel>>  Antimicrobials:   Anti-infectives (From admission, onward)    Start     Dose/Rate Route Frequency Ordered Stop   06/10/22 2300  vancomycin (VANCOCIN) IVPB 1000 mg/200 mL premix  Status:  Discontinued       See Hyperspace for full Linked Orders Report.  1,000 mg 200 mL/hr over 60 Minutes Intravenous Every 48 hours 06/08/22 2307 06/09/22 0922   06/10/22 2300  vancomycin (VANCOREADY) IVPB 1250 mg/250 mL  Status:  Discontinued       See Hyperspace for full Linked Orders Report.   1,250 mg 166.7 mL/hr  over 90 Minutes Intravenous Every 48 hours 06/08/22 2307 06/09/22 0922   06/10/22 1800  cephALEXin (KEFLEX) capsule 500 mg        500 mg Oral Every 6 hours 06/10/22 1521     06/09/22 2200  vancomycin (VANCOREADY) IVPB 750 mg/150 mL  Status:  Discontinued        750 mg 150 mL/hr over 60 Minutes Intravenous Every 24 hours 06/09/22 0923 06/10/22 1521   06/09/22 1000  ceFEPIme (MAXIPIME) 2 g in sodium chloride 0.9 % 100 mL IVPB  Status:  Discontinued        2 g 200 mL/hr over 30 Minutes Intravenous Every 12 hours 06/09/22 0916 06/10/22 1521   06/08/22 2300  vancomycin (VANCOCIN) IVPB 1000 mg/200 mL premix  Status:  Discontinued        1,000 mg 200 mL/hr over 60 Minutes Intravenous  Once 06/08/22 2257 06/08/22 2307   06/08/22 2215  vancomycin (VANCOCIN) IVPB 1000 mg/200 mL premix       See Hyperspace for full Linked Orders Report.   1,000 mg 200 mL/hr over 60 Minutes Intravenous  Once 06/08/22 2210 06/09/22 0014   06/08/22 2215  vancomycin (VANCOREADY) IVPB 1500 mg/300 mL       See Hyperspace for full Linked Orders Report.   1,500 mg 150 mL/hr over 120 Minutes Intravenous  Once 06/08/22 2210 06/10/22 0807   06/08/22 1915  piperacillin-tazobactam (ZOSYN) IVPB 3.375 g        3.375 g 100 mL/hr over 30 Minutes Intravenous  Once 06/08/22 1909 06/08/22 [place] Hospital Events: Including procedures, antibiotic start and stop dates in addition to other pertinent events   1/20: Presented to ED, admitted by [facility] 1/21: Added cefepime, continue vancomycin, increase dose of Cardizem for better heart rate control 1/22: Had to reduce Cardizem dose back to her home dose as blood pressure dropped 1/23: Transferred to ICU overnight due to CO2 narcosis, placed on BiPAP.  PCCM consulted.  Worsening AKI, holding diuresis.  Agitation requiring Precedex, high risk for Intubation.  Palliative Care consulted for [place] conversations.  ABX broadened back to Cefepime and Vancomycin.  Interim History /  Subjective:  -Last night was very lethargic, found to have CO2 narcosis ~ placed on BiPAP and transferred to ICU -This morning remains altered (mental status fluctuates b/w lethargy and agitation),  -May require Precedex ~ HIGH RISK FOR INTUBATION -Worsening AKI, holding diuresis today   Objective   Blood pressure 107/60, pulse 95, temperature 98.3 F (36.8 C), temperature source Axillary, resp. rate (!) 25, height '5\' 4"'$  (1.626 m), weight (!) 158.3 kg, SpO2 100 %.    FiO2 (%):  [40 %-45 %] 40 %   Intake/Output Summary (Last 24 hours) at 06/11/2022 0737 Last data filed at 06/11/2022 0400 Gross per 24 hour  Intake 250.16 ml  Output 300 ml  Net -49.84 ml   Filed Weights   06/09/22 1139  Weight: (!) 158.3 kg    Examination: General: Acute on chronically ill-appearing obese female, laying in bed, on BiPAP, in no acute distress HENT: Atraumatic, normocephalic, neck supple, difficult to assess JVD due to body habitus and BiPAP Lungs: Distant diminished breath  sounds throughout, no wheezing or rales noted, BiPAP assisted, even Cardiovascular: Regular rate and rhythm, S1-S2, no murmurs, rubs, gallops Abdomen: Obese, soft, nontender, nondistended, no guarding or rebound tenderness, bowel sounds positive x 4 Extremities: No deformities, lymphedema to bilateral lower extremities Neuro: Mental status fluctuates between lethargy and agitation, does arouse to voice, oriented to person and place, purposeful movement of extremities, and follows commands, pupils PERRLA GU: Inserting Foley catheter due to retention Skin: Cellulitis to bilateral lower extremities, yeast excoriation to abdominal fold/groins  Resolved Hospital Problem list     Assessment & Plan:   Acute on Chronic Hypercapnic Respiratory Failure in setting of AECOPD + sepsis PMHx: OSA (noncompliant with CPAP at home), morbid obesity with probable OHS -Supplemental O2 as needed to maintain O2 sats 88 to 92% -BiPAP, wean as  tolerated -HIGH RISK FOR INTUBATION -Follow intermittent Chest X-ray & ABG as needed -Bronchodilators & Pulmicort nebs -IV Steroids -ABX as above -Diuresis as BP and renal function permits ~ hold 9/14 due to worsening AKI -Pulmonary toilet as able  Chronic HFpEF PMHx: HTN Echocardiogram 07/16/21: LVEF 55-60%, indeterminate diastolic parameters, RV systolic function normal -Continuous cardiac monitoring -Maintain MAP >65 -Diuresis as BP and renal function permits ~ holding due to worsening AKI -Consider repeat Echo  Sepsis secondary to Cellulitis of Lower extremities (Meets SIRS Criteria: HR >90, RR >20, fever) -Monitor fever curve -Trend WBC's & Procalcitonin -Follow cultures as above -Broaden empiric ABX to Cefepime and Vancomycin pending cultures & sensitivities  Acute Kidney Injury on CKD III ~ WORSENING -Monitor I&O's / urinary output -Follow BMP -Ensure adequate renal perfusion -Avoid nephrotoxic agents as able -Replace electrolytes as indicated -Low threshold for Nephrology consultation  Anemia of Chronic Disease -Monitor for S/Sx of bleeding -Trend CBC -Heparin for VTE Prophylaxis  -Transfuse for Hgb <7  Acute Metabolic Encephalopathy in the setting of CO2 Narcosis and multiple metabolic derangements -Treatment of Hypercapnia and metabolic derangements/sepsis as outlined above -Provide supportive care -Promote normal sleep/wake cycle and family presence -Avoid sedating meds as able -Precedex if needed to help tolerate BiPAP    Pt is critically ill with multiorgan failure superimposed on multiple chronic co morbidities and morbid obesity.  High risk for further decompensation, need for intubation, cardiac arrest and death.  If she were to be intubated, would likely need Tracheotomy.  Recommend DNR/DNI status, Palliative Care consulted to assist with goals of care.   Best Practice (right click and "Reselect all SmartList Selections" daily)   Diet/type: NPO DVT  prophylaxis: prophylactic heparin  GI prophylaxis: PPI Lines: N/A Foley:  Yes, and it is still needed (due to urinary retention) Code Status:  full code Last date of multidisciplinary goals of care discussion [1/23]  1/23: Will update pt's son when he arrives at bedside.  Labs   CBC: Recent Labs  Lab 06/08/22 1918 06/09/22 0716 06/10/22 0335 06/11/22 0337  WBC 12.9* 11.0* 10.8* 11.4*  NEUTROABS 11.3*  --   --   --   HGB 8.0* 8.8* 8.6* 8.2*  HCT 27.7* 30.6* 30.1* 29.5*  MCV 83.2 83.6 83.8 86.8  PLT 221 257 PLATELET CLUMPS NOTED ON SMEAR, UNABLE TO ESTIMATE 782    Basic Metabolic Panel: Recent Labs  Lab 06/08/22 1918 06/09/22 0716 06/10/22 0335 06/11/22 0337  NA 137 137 137 139  K 3.5 3.6 4.1 4.4  CL 96* 98 99 99  CO2 '29 31 24 29  '$ GLUCOSE 123* 97 102* 85  BUN 39* 43* 45* 52*  CREATININE 1.59* 1.92*  2.10* 2.71*  CALCIUM 7.8* 7.7* 8.1* 8.3*   GFR: Estimated Creatinine Clearance: 29.3 mL/min (A) (by C-G formula based on SCr of 2.71 mg/dL (H)). Recent Labs  Lab 06/08/22 1918 06/08/22 2109 06/09/22 0716 06/10/22 0335 06/11/22 0337  PROCALCITON  --   --  0.87  --   --   WBC 12.9*  --  11.0* 10.8* 11.4*  LATICACIDVEN 1.1 0.9  --   --   --     Liver Function Tests: Recent Labs  Lab 06/08/22 1918 06/09/22 0716  AST 17 17  ALT 8 10  ALKPHOS 54 60  BILITOT 0.6 0.6  PROT 6.6 7.2  ALBUMIN 2.4* 2.5*   No results for input(s): "LIPASE", "AMYLASE" in the last 168 hours. No results for input(s): "AMMONIA" in the last 168 hours.  ABG    Component Value Date/Time   PHART 7.17 (LL) 06/11/2022 0155   PCO2ART 90 (HH) 06/11/2022 0155   PO2ART 119 (H) 06/11/2022 0155   HCO3 32.8 (H) 06/11/2022 0155   ACIDBASEDEF 2.2 (H) 06/10/2022 2135   O2SAT 99.3 06/11/2022 0155     Coagulation Profile: Recent Labs  Lab 06/08/22 1918  INR 1.3*    Cardiac Enzymes: No results for input(s): "CKTOTAL", "CKMB", "CKMBINDEX", "TROPONINI" in the last 168 hours.  HbA1C: Hgb  A1c MFr Bld  Date/Time Value Ref Range Status  06/08/2022 07:16 AM 5.8 (H) 4.8 - 5.6 % Final    Comment:    (NOTE) Pre diabetes:          5.7%-6.4%  Diabetes:              >6.4%  Glycemic control for   <7.0% adults with diabetes   07/15/2021 04:30 PM 5.4 4.8 - 5.6 % Final    Comment:    (NOTE) Pre diabetes:          5.7%-6.4%  Diabetes:              >6.4%  Glycemic control for   <7.0% adults with diabetes     CBG: Recent Labs  Lab 06/10/22 2103 06/10/22 2336  GLUCAP 100* 88    Review of Systems:   Unable to assess due to AMS and BiPAP  Past Medical History:  She,  has a past medical history of Acute bronchitis, Anemia, Arthritis, Asthma, B12 deficiency (03/14/2017), Chest pain (07/02/2018), Chest pain, Chronic heart failure with preserved ejection fraction (HFpEF) ([place]), COPD (chronic obstructive pulmonary disease) ([place]) (07/24/2018), Family history of blood clots (07/16/2017), Irritable bowel syndrome, Malignant hypertension, Obesity, OSA (obstructive sleep apnea), Pneumonia (2017), Restless leg syndrome, and Stroke ([place]) (1995).   Surgical History:   Past Surgical History:  Procedure Laterality Date   CHOLECYSTECTOMY     COLONOSCOPY WITH PROPOFOL N/A 03/31/2018   Procedure: COLONOSCOPY WITH PROPOFOL;  Surgeon: Lucilla Lame, MD;  Location: [facility] ENDOSCOPY;  Service: Endoscopy;  Laterality: N/A;   KNEE ARTHROPLASTY Right 04/30/2019   Procedure: COMPUTER ASSISTED TOTAL KNEE ARTHROPLASTY;  Surgeon: Dereck Leep, MD;  Location: [facility] ORS;  Service: Orthopedics;  Laterality: Right;   RIGHT HEART CATH N/A 01/30/2022   Procedure: RIGHT HEART CATH;  Surgeon: Nelva Bush, MD;  Location: [facility] LAB;  Service: Cardiovascular;  Laterality: N/A;   saliva gland removal  1998   TOTAL KNEE ARTHROPLASTY Left 2014   TUBAL LIGATION Bilateral      Social History:   reports that she has never smoked. She has never used smokeless tobacco. She reports that she does not  drink alcohol and does not use drugs.   Family History:  Her family history includes COPD in her father; Coronary artery disease (age of onset: 91) in her brother; Dementia in her mother; Diabetes in her father; Heart failure in her brother and maternal grandmother; Stroke in her maternal grandmother. There is no history of Breast cancer.   Allergies Allergies  Allergen Reactions   Nuvigil [Armodafinil] Hives   Penicillins Diarrhea and Nausea And Vomiting    Did it involve swelling of the face/tongue/throat, SOB, or low BP? no Did it involve sudden or severe rash/hives, skin peeling, or any reaction on the inside of your mouth or nose? No Did you need to seek medical attention at a hospital or doctor's office? No When did it last happen?  in her 22s    If all above answers are "NO", may proceed with cephalosporin use.    Spironolactone Itching   Gabapentin Other (See Comments)    "wiped her out, couldn't stay awake"   Melatonin Other (See Comments)    "Brain fog"   Provigil [Modafinil] Hives   Entresto [Sacubitril-Valsartan] Itching   Lisinopril Cough     Home Medications  Prior to Admission medications   Medication Sig Start Date End Date Taking? Authorizing Provider  acetaminophen (TYLENOL) 500 MG tablet Take 1,000 mg by mouth every 6 (six) hours as needed.   Yes [provider]  benzonatate (TESSALON) 100 MG capsule TAKE 1 CAPSULE BY MOUTH THREE TIMES A DAY AS NEEDED FOR COUGH 03/08/22  Yes Tyler Pita, MD  calcitRIOL (ROCALTROL) 0.25 MCG capsule Take 0.25 mcg by mouth daily. 02/22/22  Yes [provider]  dapagliflozin propanediol (FARXIGA) 10 MG TABS tablet Take 1 tablet (10 mg total) by mouth daily before breakfast. 03/07/22  Yes Hackney, Tina A, FNP  diltiazem (CARDIZEM CD) 240 MG 24 hr capsule Take 1 capsule (240 mg total) by mouth daily. 09/05/21  Yes Theora Gianotti, NP  ipratropium-albuterol (DUONEB) 0.5-2.5 (3) MG/3ML SOLN TAKE 3 MLS BY  NEBULIZATION EVERY 6 (SIX) HOURS AS NEEDED. J45.40 03/04/22  Yes Tyler Pita, MD  metolazone (ZAROXOLYN) 2.5 MG tablet Take 1 tablet (2.5 mg total) by mouth 2 (two) times a week. Patient taking differently: Take 2.5 mg by mouth 2 (two) times a week. Monday and Friday 02/04/22  Yes Wouk, Ailene Rud, MD  ondansetron (ZOFRAN) 4 MG tablet Take 1 tablet (4 mg total) by mouth daily as needed for nausea or vomiting. 05/10/22 05/10/23 Yes Lorella Nimrod, MD  pantoprazole (PROTONIX) 40 MG tablet TAKE 1 TABLET BY MOUTH EVERY DAY 12/11/21  Yes Glean Hess, MD  PREVALITE 4 g packet DISSOLVE AND DRINK 1 PACKET BY MOUTH AT BEDTIME 03/05/22  Yes Glean Hess, MD  rOPINIRole (REQUIP) 1 MG tablet Take 1 tablet (1 mg total) by mouth 3 (three) times daily. 02/13/22  Yes Glean Hess, MD  Torsemide 40 MG TABS Take 40 mg by mouth daily.   Yes [provider]  budesonide (PULMICORT) 0.5 MG/2ML nebulizer solution TAKE 2 ML (0.5 MG TOTAL) BY NEBULIZATION TWICE A DAY Patient not taking: Reported on 06/08/2022 10/29/21   Tyler Pita, MD  feeding supplement (ENSURE ENLIVE / ENSURE PLUS) LIQD Take 237 mLs by mouth 3 (three) times daily between meals. 05/10/22   Lorella Nimrod, MD  metoCLOPramide (REGLAN) 5 MG tablet Take 1 tablet (5 mg total) by mouth 3 (three) times daily before meals. Patient not taking: Reported on 06/08/2022 05/12/22  Sharen Hones, MD  NON FORMULARY Bipap - nightly with Oxygen 2 Lpm    [provider]  oxyCODONE 7.5 MG TABS Take 7.5 mg by mouth every 6 (six) hours as needed. 05/10/22 05/10/23  Lorella Nimrod, MD  OXYGEN Inhale into the lungs. 2 LPM [place]    [provider]  promethazine (PHENERGAN) 25 MG tablet Take 1 tablet (25 mg total) by mouth every 6 (six) hours as needed. Patient not taking: Reported on 06/08/2022 05/10/22 05/10/23  Lorella Nimrod, MD     Critical care time: 50 minutes     Darel Hong, AGACNP-BC [facility] Prefer epic messenger for cross cover needs If after hours, please call E-link

## 2022-06-11 NOTE — TOC Initial Note (Signed)
Transition of Care [facility]) - Initial/Assessment Note    Patient Details  Name: Samantha Mason MRN: [id number] Date of Birth: 03-17-1951  [facility] CM/SW Contact:    Shelbie Hutching, RN Phone Number: 06/11/2022, 10:53 AM  Clinical Narrative:                 Patient admitted to the hospital with Sepsis, from home, usually independent per family.  Currently patient is in the ICU on Bipap, agitated, restless.  MD placing orders for palliative consult.   TOC will follow.   Expected Discharge Plan:  (TBD) Barriers to Discharge: Continued Medical Work up   Patient Goals and CMS Choice Patient states their goals for this hospitalization and ongoing recovery are:: patient is unable to state at this time- agitated on Bipap          Expected Discharge Plan and Services   Discharge Planning Services: CM Consult   Living arrangements for the past 2 months: Single Family Home                                      Prior Living Arrangements/Services Living arrangements for the past 2 months: Single Family Home   Patient language and need for interpreter reviewed:: Yes        Need for Family Participation in Patient Care: Yes (Comment) Care giver support system in place?: Yes (comment) Current home services: DME Criminal Activity/Legal Involvement Pertinent to Current Situation/Hospitalization: No - Comment as needed  Activities of Daily Living Home Assistive Devices/Equipment: Oxygen, Other (Comment) (triligy) ADL Screening (condition at time of admission) Patient's cognitive ability adequate to safely complete daily activities?: Yes Is the patient deaf or have difficulty hearing?: No Does the patient have difficulty seeing, even when wearing glasses/contacts?: No Does the patient have difficulty concentrating, remembering, or making decisions?: No Patient able to express need for assistance with ADLs?: Yes Does the patient have difficulty dressing or  bathing?: No Independently performs ADLs?: Yes (appropriate for developmental age) Does the patient have difficulty walking or climbing stairs?: Yes Weakness of Legs: Both Weakness of Arms/Hands: None  Permission Sought/Granted Permission sought to share information with : Family Supports    Share Information with NAME: Megyn Leng     Permission granted to share info w Relationship: son  Permission granted to share info w Contact Information: [phone]  Emotional Assessment Appearance:: Appears stated age Attitude/Demeanor/Rapport: Unable to Assess Affect (typically observed): Unable to Assess   Alcohol / Substance Use: Not Applicable Psych Involvement: No (comment)  Admission diagnosis:  Sepsis ([place]) [A41.9] Cellulitis, unspecified cellulitis site [L03.90] Community acquired pneumonia, unspecified laterality [J18.9] Sepsis, due to unspecified organism, unspecified whether acute organ dysfunction present [facility]) [A41.9] Patient Active Problem List   Diagnosis Date Noted   Sepsis ([place]) 06/08/2022   AMS (altered mental status) 04/30/2022   Small bowel obstruction ([place]) 04/26/2022   Hypokalemia 01/26/2022   SOB (shortness of breath)    Extreme obesity with alveolar hypoventilation (HCC)    CKD (chronic kidney disease) stage 4, GFR 15-29 ml/min (HCC) 01/23/2022   Chronic respiratory failure with hypoxia and hypercapnia ([place]) 10/15/2021   Acute on chronic heart failure with preserved ejection fraction (HFpEF) ([place]) 08/27/2021   Hyponatremia 07/26/2021   Insomnia 07/22/2021   Acute on chronic respiratory failure with hypoxia and hypercapnia ([place]) 07/15/2021   Chronic asthmatic bronchitis 07/15/2021   Morbid obesity ([place])  07/15/2021   Myocardial injury 07/15/2021   Chronic diastolic CHF (congestive heart failure) ([place])    History of stroke 11/17/2020   Ovarian failure 02/21/2020   Primary osteoarthritis of right knee 02/13/2019   S/P total knee arthroplasty, left  02/13/2019   Aortic atherosclerosis ([place]) 08/01/2018   Obstructive lung disease (generalized) ([place]) 07/24/2018   B12 deficiency anemia 07/07/2018   Benign neoplasm of ascending colon    Severe persistent asthma 02/13/2018   Vitamin D deficiency 03/17/2017   Prediabetes 03/14/2017   Microcytic anemia 03/14/2017   Irritable bowel syndrome with diarrhea 03/12/2017   Allergic rhinitis 03/12/2017   Restless leg syndrome 03/12/2017   GERD (gastroesophageal reflux disease) 10/16/2016   Essential hypertension 10/16/2016   Morbid obesity with BMI of 50.0-59.9, adult ([place]) 12/08/2014   OSA  12/08/2014   Shortness of breath 04/07/2014   PCP:  Glean Hess, MD Pharmacy:   [id number] [place] [place] [address]Phone: [phone]: [phone] [id number] [place] [address] [id number]: [phone]: [phone]   Social Determinants of Health (SDOH) Social History: SDOH Screenings   Food Insecurity: No Food Insecurity (06/09/2022)  Housing: Low Risk  (06/09/2022)  Transportation Needs: No Transportation Needs (06/09/2022)  Utilities: Not At Risk (06/09/2022)  Alcohol Screen: Low Risk  (08/08/2021)  Depression (PHQ2-9): Medium Risk (02/25/2022)  Financial Resource Strain: Medium Risk (08/14/2021)  Physical Activity: Inactive (08/08/2021)  Social Connections: Socially Isolated (08/08/2021)  Stress: No Stress Concern Present (08/08/2021)  Tobacco Use: Low Risk  (06/08/2022)   SDOH Interventions:     Readmission Risk Interventions     No data to display

## 2022-06-11 NOTE — Progress Notes (Signed)
     Referral received for [name] re: goals of care discussion. Chart reviewed and updates received from RN. Patient assessed and is unable to engage appropriately in discussions.   I attempted to contact patient's daughter over the phone. There was no answer and I left a HIPPA appropriate voicemail with PMT contact information.   I was able to speak with her son [name] over the phone . [place] meeting scheduled for today @ 1:30pm. Family is aware that we will meet at patient's bedside.   Detailed note and recommendations to follow once GOC has been completed.   Thank you for your referral and allowing PMT to assist in Mrs. Mahealani Sulak Wexler's care.   Jordan Hawks, FNP-BC Palliative Medicine Team  Phone: [phone]  NO CHARGE

## 2022-06-11 NOTE — Progress Notes (Signed)
Pharmacy Antibiotic Note  Samantha Mason is a 72 y.o. female admitted on 06/08/2022 with sepsis.  Pharmacy has been consulted for Vancomycin dosing.  Plan: Vancomycin 2g IV x 1 dose ordered. Due patient's worsening renal function, will hold off on AUC dosing currently and dose off of 24 hours levels - Vanc random ordered for 1/24'@1200'$   Continue cefepime 2g q12H.   Height: '5\' 4"'$  (162.6 cm) Weight: (!) 158.3 kg (349 lb) IBW/kg (Calculated) : 54.7  Temp (24hrs), Avg:98.6 F (37 C), Min:98.2 F (36.8 C), Max:99.5 F (37.5 C)  Recent Labs  Lab 06/08/22 1918 06/08/22 2109 06/09/22 0716 06/10/22 0335 06/11/22 0337  WBC 12.9*  --  11.0* 10.8* 11.4*  CREATININE 1.59*  --  1.92* 2.10* 2.71*  LATICACIDVEN 1.1 0.9  --   --   --      Estimated Creatinine Clearance: 29.3 mL/min (A) (by C-G formula based on SCr of 2.71 mg/dL (H)).    Allergies  Allergen Reactions   Nuvigil [Armodafinil] Hives   Penicillins Diarrhea and Nausea And Vomiting    Did it involve swelling of the face/tongue/throat, SOB, or low BP? no Did it involve sudden or severe rash/hives, skin peeling, or any reaction on the inside of your mouth or nose? No Did you need to seek medical attention at a hospital or doctor's office? No When did it last happen?  in her 72s    If all above answers are "NO", may proceed with cephalosporin use.    Spironolactone Itching   Gabapentin Other (See Comments)    "wiped her out, couldn't stay awake"   Melatonin Other (See Comments)    "Brain fog"   Provigil [Modafinil] Hives   Entresto [Sacubitril-Valsartan] Itching   Lisinopril Cough      Microbiology results: 1/20 BCx: NGTD 1/20 UCx: NG 1/20 MRSA PCR: negative  Thank you for allowing pharmacy to be a part of this patient's care.  Pearla Dubonnet, PharmD Clinical Pharmacist 06/11/2022 1:52 PM

## 2022-06-11 NOTE — Consult Note (Signed)
Consultation Note Date: 06/11/2022 at [place]  Patient Name: Samantha Mason  DOB: [birth year]  MRN: [id number]  Age / Sex: 72 y.o., female  PCP: Glean Hess, MD Referring Physician: Max Sane, MD  Reason for Consultation: Establishing goals of care  HPI/Patient Profile: 72 y.o. female  with past medical history of morbid obesity, chronic HFpEF, CKD (stage III), chronic LE lymphedema (treatment of torsemide), HTN, asthma, stroke (1995), and OSA (noncompliant with CPAP) admitted on 06/08/2022 with feeling unwell for approximately 1 week, chills, generalized weakness, and fatigue.  Patient is being treated for sepsis in setting of lower extremity cellulitis, AKI, and acute on chronic hypercapnic respiratory failure in the setting of AECOPD.  1/23, patient was transferred to ICU due to CO2 narcosis and placed on BiPAP.  Patient is having worsening AKI as well as agitation requiring Precedex.  Patient is at high risk for intubation.  PMT was consulted to discuss goals of care.  Clinical Assessment and Goals of Care: I have reviewed medical records including EPIC notes, labs and imaging, assessed the patient, he was unable to participate in discussions independently, and then met with patient's son Legrand Como at bedside to discuss diagnosis prognosis, [place], EOL wishes, disposition and options.  I introduced Palliative Medicine as specialized medical care for people living with serious illness. It focuses on providing relief from the symptoms and stress of a serious illness. The goal is to improve quality of life for both the patient and the family.  We discussed a brief life review of the patient.  Patient is a widow and has 2 children, Legrand Como and [employer].  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  As far as functional and nutritional status Legrand Como shares patient had great independence at home up until about a week  PTA.  He shares that she started to become short of breath and began confused, prompting the call to EMS.  We discussed patient's current illness and what it means in the larger context of patient's on-going co-morbidities.  Medical update given.  Legrand Como confirms understanding of patient's current medical situation and when he vocalizes he understands that multiple organs are starting to fail and that patient may require use of life support.   I attempted to elicit values and goals of care important to the patient. Advance directives, concepts specific to code status, artificial feeding and hydration, and rehospitalization were considered and discussed.  Legrand Como says the patient has an advanced directive in a drawer at home and he has just not reviewed it yet.  He believes she would want to remain a full code.  I encouraged him to review this documentation.  I reviewed that if patient wishes to remain a full code and requires intubation, she would likely require tracheostomy and placement of a feeding tube to support her nutrition.  This would mean patient would need a higher level of skilled nursing care, likely in a long-term care facility.  Legrand Como shares she does not think the patient would want this.  However, he  feels overwhelmed and is not able to fully process everything at this time.  He would like to speak with his Sister Izora Gala over the phone before changing anything to patient's plan of care.  Family is facing treatment option decisions, advanced directive, and anticipatory care needs.    Discussed with patient/family the importance of continued conversation with family and the medical providers regarding overall plan of care and treatment options, ensuring decisions are within the context of the patient's values and GOCs.    Questions and concerns were addressed. PMT contact info given to Legrand Como and he was encouraged to call with questions or concerns. Emotional support provided.  I  attempted to speak with patient's daughter Izora Gala over the phone.  No answer.  HIPAA compliant voicemail left with PMT contact info.  I advised CCM NP Dewaine Conger of above discussions, and he plans to follow-up with family today as well.  PMT will continue to follow.   Primary Decision Maker NEXT OF KIN  Physical Exam Vitals reviewed.  Constitutional:      Appearance: She is ill-appearing.  HENT:     Head: Normocephalic.     Mouth/Throat:     Mouth: Mucous membranes are moist.  Cardiovascular:     Rate and Rhythm: Normal rate.  Pulmonary:     Comments: bipap Musculoskeletal:     Comments: Does not MAETC  Skin:    General: Skin is warm.     Comments: Bilateral LE redness r/t cellulitis     Palliative Assessment/Data:     Thank you for this consult. Palliative medicine will continue to follow and assist holistically.   Time Total: 75 minutes Greater than 50%  of this time was spent counseling and coordinating care related to the above assessment and plan.  Signed by: Jordan Hawks, DNP, FNP-BC Palliative Medicine    Please contact Palliative Medicine Team phone at [phone] for questions and concerns.  For individual provider: See Shea Evans

## 2022-06-11 NOTE — Progress Notes (Signed)
  Progress Note   Patient: Samantha Mason [id number] DOB: 1951/04/16 DOA: 06/08/2022     3 DOS: the patient was seen and examined on 06/11/2022   Brief hospital course: 72 y.o. female with medical history significant for Morbid obesity, chronic HFpEF,CKD stage III, chronic lower extremity lymphedema on treatment with torsemide, hypertension, asthma, stroke (1995) and obstructive sleep apnea(non compliant with cpap) admitted for sepsis likely secondary to cellulitis.    1/21: Added cefepime, continue vancomycin, increase dose of Cardizem for better heart rate control 1/22: Had to reduce Cardizem dose back to her home dose as blood pressure dropped 1/23: Transfer to ICU/stepdown due to CO2 narcosis requiring BiPAP consulted PCCM.  Palliative care consult for goals of care conversation.  Agitation requiring Precedex, worsening AKI  Assessment and Plan:  Sepsis: Present on admission most likely secondary to cellulitis as evidenced by by tachycardia, fever, endorgan damage and cellulitis.  Continue cefepime, vancomycin  Acute Metabolic Encephalopathy Likely CO2 Narcosis BiPAP while in ICU and as need May require Precedex Intensivist consult   Chronic diastolic CHF: worsening renal function so holding Lasix and Farxiga. Acute on chronic hypercapnic respiratory failure with underlying sleep apnea and morbid obesity Acute bronchitis BMI 59 Noncompliant with CPAP at home Quiring BiPAP, wean as tolerated. Continue empiric antibiotics.  No reported coughing spells.  Respiratory panel was negative for COVID or flu. High risk for intubation.  Family is requesting full code per palliative care meeting -Started steroids in nebs   AKI on chronic kidney disease stage IIIb:  Worsening now.  Consider nephro consult if still worsening tomorrow.  Holding Lasix Lab Results  Component Value Date   CREATININE 2.71 (H) 06/11/2022   CREATININE 2.10 (H) 06/10/2022   CREATININE 1.92 (H) 06/09/2022     Anemia of chronic kidney disease: monitor H&H during the course of stay.  No indication for PRBC transfusion at this time.  Hemoglobin 8.2   Essential hypertension: Continue Cardizem CD       Subjective: Worsen overnight with hypercarbia requiring BiPAP and transfer to stepdown/ICU.  Now on Precedex drip and BiPAP.  Not feeling good  Physical Exam: Vitals:   06/11/22 1300 06/11/22 1400 06/11/22 1418 06/11/22 1600  BP: (!) 101/51 (!) 101/51  (!) 107/48  Pulse: 69 67 71 70  Resp: 20 15 (!) 32 (!) 21  Temp:    98.9 F (37.2 C)  TempSrc:    Oral  SpO2: 98% 100% 99% 99%  Weight:      Height:       72 year old morbidly obese female lying in the bed looking critically ill, on BiPAP in acute respiratory distress Lungs rhonchi and wheezing at the bases bilaterally, difficult evaluation due to body habitus Heart regular rate and rhythm Abdomen soft, obese, benign Neuro alert, awake, nonfocal Skin/extremities: 2+ bilateral lower extremity pedal edema.  Right upper thigh redness  Data Reviewed:  Hemoglobin 8.2, WBC 11.4  Family Communication: Family was updated by palliative care as they had a family meeting this afternoon to discuss goals of care  Disposition: Status is: Inpatient Remains inpatient appropriate because: Looking critically sick and high risk for acute cardiorespiratory failure, multiorgan failure and death  Planned Discharge Destination: Home with Home Health   DVT prophylaxis-heparin Time spent (critical care): 35 minutes  Author: Max Sane, MD 06/11/2022 5:24 PM  For on call review [email or web address].

## 2022-06-12 ENCOUNTER — Inpatient Hospital Stay
Admit: 2022-06-12 | Discharge: 2022-06-12 | Disposition: A | Discharge status: Home / Self Care | Payer: Medicare Other | Attending: Critical Care Medicine | Admitting: Critical Care Medicine

## 2022-06-12 ENCOUNTER — Inpatient Hospital Stay: Payer: Medicare Other

## 2022-06-12 DIAGNOSIS — G9341 Metabolic encephalopathy: Secondary | ICD-10-CM | POA: Insufficient documentation

## 2022-06-12 DIAGNOSIS — L0390 Cellulitis, unspecified: Secondary | ICD-10-CM | POA: Diagnosis not present

## 2022-06-12 DIAGNOSIS — J9621 Acute and chronic respiratory failure with hypoxia: Secondary | ICD-10-CM

## 2022-06-12 DIAGNOSIS — J9622 Acute and chronic respiratory failure with hypercapnia: Secondary | ICD-10-CM

## 2022-06-12 DIAGNOSIS — N170 Acute kidney failure with tubular necrosis: Secondary | ICD-10-CM | POA: Diagnosis not present

## 2022-06-12 DIAGNOSIS — Z515 Encounter for palliative care: Secondary | ICD-10-CM | POA: Diagnosis not present

## 2022-06-12 DIAGNOSIS — A419 Sepsis, unspecified organism: Secondary | ICD-10-CM | POA: Diagnosis not present

## 2022-06-12 DIAGNOSIS — J189 Pneumonia, unspecified organism: Secondary | ICD-10-CM | POA: Diagnosis not present

## 2022-06-12 LAB — VITAMIN B12: Vitamin B-12: 399 pg/mL (ref 180–914)

## 2022-06-12 LAB — BLOOD GAS, VENOUS
Acid-base deficit: 1.4 mmol/L (ref 0.0–2.0)
Bicarbonate: 25.2 mmol/L (ref 20.0–28.0)
Delivery systems: POSITIVE
FIO2: 40 %
O2 Saturation: 98.1 %
Patient temperature: 37
pCO2, Ven: 49 mmHg (ref 44–60)
pH, Ven: 7.32 (ref 7.25–7.43)
pO2, Ven: 79 mmHg — ABNORMAL HIGH (ref 32–45)

## 2022-06-12 LAB — RESPIRATORY PANEL BY PCR

## 2022-06-12 LAB — GLUCOSE, CAPILLARY: Glucose-Capillary: 142 mg/dL — ABNORMAL HIGH (ref 70–99)

## 2022-06-12 LAB — RENAL FUNCTION PANEL
Albumin: 2.5 g/dL — ABNORMAL LOW (ref 3.5–5.0)
Anion gap: 17 — ABNORMAL HIGH (ref 5–15)
BUN: 70 mg/dL — ABNORMAL HIGH (ref 8–23)
CO2: 22 mmol/L (ref 22–32)
Calcium: 8.2 mg/dL — ABNORMAL LOW (ref 8.9–10.3)
Chloride: 100 mmol/L (ref 98–111)
Creatinine, Ser: 2.85 mg/dL — ABNORMAL HIGH (ref 0.44–1.00)
GFR, Estimated: 17 mL/min — ABNORMAL LOW (ref >60)
Glucose, Bld: 155 mg/dL — ABNORMAL HIGH (ref 70–99)
Phosphorus: 6.6 mg/dL — ABNORMAL HIGH (ref 2.5–4.6)
Potassium: 4.8 mmol/L (ref 3.5–5.1)
Sodium: 139 mmol/L (ref 135–145)

## 2022-06-12 LAB — VANCOMYCIN, RANDOM: Vancomycin Rm: 32 ug/mL

## 2022-06-12 LAB — CBC
HCT: 31.5 % — ABNORMAL LOW (ref 36.0–46.0)
Hemoglobin: 9.2 g/dL — ABNORMAL LOW (ref 12.0–15.0)
MCH: 23.9 pg — ABNORMAL LOW (ref 26.0–34.0)
MCHC: 29.2 g/dL — ABNORMAL LOW (ref 30.0–36.0)
MCV: 81.8 fL (ref 80.0–100.0)
Platelets: 159 10*3/uL (ref 150–400)
RBC: 3.85 MIL/uL — ABNORMAL LOW (ref 3.87–5.11)
RDW: 16.9 % — ABNORMAL HIGH (ref 11.5–15.5)
WBC: 6.1 10*3/uL (ref 4.0–10.5)
nRBC: 0 % (ref 0.0–0.2)

## 2022-06-12 LAB — FERRITIN: Ferritin: 106 ng/mL (ref 11–307)

## 2022-06-12 LAB — MAGNESIUM: Magnesium: 1.7 mg/dL (ref 1.7–2.4)

## 2022-06-12 LAB — IRON AND TIBC
Iron: 31 ug/dL (ref 28–170)
Saturation Ratios: 12 % (ref 10.4–31.8)
TIBC: 253 ug/dL (ref 250–450)
UIBC: 222 ug/dL

## 2022-06-12 MED ORDER — DIAZEPAM 5 MG/ML IJ SOLN
2.5000 mg | INTRAMUSCULAR | Status: AC
  Administered 2022-06-12: 2.5 mg via INTRAVENOUS

## 2022-06-12 MED ORDER — ORAL CARE MOUTH RINSE: 15.0000 mL | OROMUCOSAL | Status: DC | PRN

## 2022-06-12 MED ORDER — DIAZEPAM 5 MG/ML IJ SOLN
2.5000 mg | Freq: Once | INTRAMUSCULAR | Status: AC
  Administered 2022-06-12: 2.5 mg via INTRAVENOUS

## 2022-06-12 MED ORDER — LACTATED RINGERS IV SOLN: INTRAVENOUS | Status: DC

## 2022-06-12 MED ORDER — FLUOCINONIDE EMULSIFIED BASE 0.05 % EX CREA
TOPICAL_CREAM | CUTANEOUS | Status: DC
  Administered 2022-06-14: 1 via TOPICAL
  Filled 2022-06-12 (×4): qty 15

## 2022-06-12 MED ORDER — DIAZEPAM 5 MG/ML IJ SOLN
INTRAMUSCULAR | Status: AC
  Filled 2022-06-12: qty 2

## 2022-06-12 MED ORDER — ORAL CARE MOUTH RINSE
15.0000 mL | OROMUCOSAL | Status: DC
  Administered 2022-06-12 – 2022-06-13 (×4): 15 mL via OROMUCOSAL

## 2022-06-12 MED ORDER — SODIUM CHLORIDE 0.9 % IV SOLN
2.0000 g | INTRAVENOUS | Status: DC
  Filled 2022-06-12: qty 12.5

## 2022-06-12 MED ORDER — MAGNESIUM SULFATE 2 GM/50ML IV SOLN
2.0000 g | Freq: Once | INTRAVENOUS | Status: AC
  Administered 2022-06-12: 2 g via INTRAVENOUS
  Filled 2022-06-12: qty 50

## 2022-06-12 NOTE — Consult Note (Signed)
WOC Nurse Consult Note: Reason for Consult:Intertriginous dermatitis to abdominal pannus and inguinal folds Wound type: moisture associated skin damage to abdominal pannus and inguinal folds. Edema to lower legs, lymphedema noted in chart.  Son at bedside and states she has not been able to tolerate compression garments recently Pressure Injury POA: NA Measurement: generalized denuded skin to abdominal pannus and inguinal folds Edema to lower legs, generalized, nonpitting Wound LSL:HTDS and moist Drainage (amount, consistency, odor) minimal weeping Periwound: intact  dry skin to legs Dressing procedure/placement/frequency: Interdry to abdominal pannus and inguinal folds Measure and cut length of InterDry to fit in skin folds that have skin breakdown Tuck InterDry fabric into skin folds in a single layer, allow for 2 inches of overhang from skin edges to allow for wicking to occur May remove to bathe; dry area thoroughly and then tuck into affected areas again Do not apply any creams or ointments when using InterDry DO NOT THROW AWAY FOR 5 DAYS unless soiled with stool DO NOT [facility] product, this will inactivate the silver in the material  New sheet of Interdry should be applied after 5 days of use if patient continues to have skin breakdown   Moisturize legs with emollient cream Wrap with kerlix and ace wrap from below toes to below knee for modified light compression.  Will not follow at this time.  Please re-consult if needed.  Estrellita Ludwig MSN, RN, FNP-BC CWON Wound, Ostomy, Continence Nurse [facility] [phone] Pager [phone]

## 2022-06-12 NOTE — Progress Notes (Signed)
Progress Note   Patient: Samantha Mason [id number]:[id number] DOB: 1951-02-18 DOA: 06/08/2022     4 DOS: the patient was seen and examined on 06/12/2022   Brief hospital course: 72 y.o. female with medical history significant for Morbid obesity, chronic HFpEF,CKD stage III, chronic lower extremity lymphedema on treatment with torsemide, hypertension, asthma, stroke (1995) and obstructive sleep apnea(non compliant with cpap) admitted for sepsis likely secondary to cellulitis.  Treated with vancomycin and cefepime. Patient also developed acute metabolic encephalopathy, due to worsening agitation, patient was started on Precedex. Significant hypoxemia and CO2 retention, was placed on BiPAP and high flow oxygen.   Assessment and Plan: Severe sepsis secondary to right lower extremity cellulitis. Right lower extremity cellulitis. Patient met severe sepsis criteria at time of admission with a temperature 105, tachycardia, tachypnea and leukocytosis.  Patient also has organ failure with acute kidney injury, metabolic acidosis, acute respiratory failure. Sepsis was secondary to cellulitis.  Patient is hemodynamically stable, will continue antibiotics with cefepime and vancomycin.  Acute metabolic encephalopathy. Secondary to sepsis.  Patient still confused, with some agitation.  Continue to follow.  Acute on chronic respiratory failure with hypoxemia and hypercapnia. Acute bronchitis. Morbid obesity with obesity hypoventilation syndrome. Obstructive sleep apnea. Reviewed patient's chest CT scan, patient has significant bronchitis.  No significant volume overload at this time.  Patient is a placed on n.p.o. due to risk of aspiration, will obtain speech evaluation. Continue BiPAP while asleep.  Antibiotics will be continued. Patient oxygenation seems to be better today, currently on 4 L oxygen.  I will continue monitoring in the stepdown unit for another day.  Patient still has a very high risk of  deterioration.  Acute kidney injury on chronic kidney disease stage IIIb. Renal function still worsening, not on any diuretics at this time.  Patient does not seem to have any volume overload, due to n.p.o. status, I will give lower dose IV fluids with lactated linger at 50 mL/h. Will monitor renal and respiratory status.  Anemia of chronic disease. Continue to follow.  Essential hypertension. Patient is on Cardizem.  Chronic diastolic congestive heart failure. Overall, volume status is stable.  Continue to follow.  Echocardiogram repeat is pending.     Subjective:  Patient is very agitated today, feels very uncomfortable with her Foley catheter.  I discontinued.  Ordered as needed bladder scan. Patient also has significant hypoxia, on 4 L oxygen, does not have shortness of breath.   Physical Exam: Vitals:   06/12/22 1200 06/12/22 1300 06/12/22 1326 06/12/22 1400  BP: 136/68 (!) 149/67  129/73  Pulse: 73 65 66 68  Resp: (!) 24 (!) 21 (!) 30 (!) 33  Temp:      TempSrc:      SpO2: 97% 100% 99% 98%  Weight:      Height:       General exam: Appears agitated, uncomfortable, morbidly obese. Respiratory system: Decreased breathing sounds to auscultation. Respiratory effort normal. Cardiovascular system: S1 & S2 heard, RRR. No JVD, murmurs, rubs, gallops or clicks.  Gastrointestinal system: Abdomen is nondistended, soft and nontender. No organomegaly or masses felt. Normal bowel sounds heard. Central nervous system: Alert and oriented x2. No focal neurological deficits. Extremities: 2+leg edema Skin: No rashes, lesions or ulcers Psychiatry: agitated  Data Reviewed:  Review of the CT chest results, lab results.  Family Communication: Son updated at bedside.  Disposition: Status is: Inpatient Remains inpatient appropriate because: Severity of disease, IV treatment.  Planned Discharge Destination:  TBD  Time spent: 50 minutes  Author: Sharen Hones, MD 06/12/2022 3:06  PM  For on call review [email or web address].

## 2022-06-12 NOTE — Progress Notes (Signed)
Pt unable to go to CT at this time- I was just ablout to I and O her bc she had 375 in bladder - then patient peed in the bed- while we laid pt back to clean her up- pt became agitated and dropped her oxygen sats to 68%- and pt turned blue placed pt back on bipap- notified ICU NP- order to give morphine- HR 144- BP 115/93 MAP -100 . Dr. Roosevelt Locks made aware.

## 2022-06-12 NOTE — Progress Notes (Signed)
NAME:  [name], MRN:  [id number], DOB:  12/06/1950, LOS: 4 ADMISSION DATE:  06/08/2022, CONSULTATION DATE:  06/11/2022 REFERRING MD:  Dr. Manuella Ghazi, CHIEF COMPLAINT:  Altered Mental Status, Hypercapnia  Brief Pt Description / Synopsis:  72 y.o. female with PMHx significant for HFpEF, COPD, OSA (noncompliant with CPAP), CKD Stage III who is  admitted with Sepsis in setting of lower extremity Cellulitis, AKI, and  Acute on Chronic Hypercapnic Respiratory Failure in setting AECOPD.  Course complicated by CO2 narcosis requiring BiPAP, high risk for intubation.  History of Present Illness:  Samantha Mason is a 72 y.o. female with medical history significant for Morbid obesity, chronic HFpEF,CKD stage III, chronic lower extremity lymphedema on treatment with torsemide, hypertension, asthma, stroke (1995) and obstructive sleep apnea(non compliant with cpap) who presented to [facility] ED on 06/08/22 due to shortness of breath and generalized malaise for appropriately 1 week.  Pt is currently altered and no family currently available, therefore history is obtained from chart review.  Per ED and nursing notes, she reported about a 1 week history of feeling weak and fatigued, chills, bilateral lower extremity swelling and erythema (R>L), paroxsymal nocturnal dyspnea and orthopnea, weight gain with increased oxygen requirements.  She denied abdominal pain, N/V/D, dysuria.  ED Course: Initial Vital Signs: 105F orally, BP 129/53, HR 109, RR 27, SpO2 96% Significant Labs: BUN 39, Creatinine 1.59, Albumin 2.4, lactic acid 1.1, WBC 12.9, Hgb 8.0, Hct 27.7, Hgb A1c 5.8 COVID/FLU/RSV negative UA negative for UTI Imaging Chest X-ray>>IMPRESSION: Mild cardiomegaly with central pulmonary vascular congestion. CT Chest/Abdomen/Pelvis>> IMPRESSION: 1. Skin thickening and soft tissue edema of the partially imaged proximal right thigh. Correlate for cellulitis. 2. Newly enlarged bilateral inguinal and iliac lymph  nodes, likely reactive. 3. Diffuse bilateral bronchial wall thickening and mosaic attenuation of the airspaces, consistent with nonspecific infectious or inflammatory bronchitis. 4. Cardiomegaly.  She met Sepsis criteria, therefore she received IV fluids and antibiotics. Hospitalists were asked to admit for further workup and treatment os sepsis due to cellulitis.  Please see "significant hospital events" section below for full detailed hospital course.  Pertinent  Medical History   Past Medical History:  Diagnosis Date   Acute bronchitis    Anemia    Arthritis    Asthma    B12 deficiency 03/14/2017   Chest pain 07/02/2018   Chest pain    a. 06/2018 MV: EF 66%. No ischemia/infarct; b. 02/2021 PET CT: EF>60%, no ischemia/infarct.   Chronic heart failure with preserved ejection fraction (HFpEF) ([place])    a. 08/2020 Echo: EF 55-60%, GrI DD; b. 06/2021 Echo: EF 55-60%, mild LVH, nl RV fxn, no signif valvular dzs.   COPD (chronic obstructive pulmonary disease) ([place]) 07/24/2018   Family history of blood clots 07/16/2017   Irritable bowel syndrome    Malignant hypertension    Obesity    OSA (obstructive sleep apnea)    uses cpap   Pneumonia 2017   Restless leg syndrome    Stroke [facility]) 1995   mild     Micro Data:  1/20: SARS-CoV-2/influenza/RSV PCR>> negative 1/20: Blood culture x 2>> no growth to date 1/20: Urine>> negative 1/22: MRSA PCR>>negative  1/22: Respiratory viral panel>>  Antimicrobials:   Anti-infectives (From admission, onward)    Start     Dose/Rate Route Frequency Ordered Stop   06/11/22 1145  vancomycin (VANCOREADY) IVPB 2000 mg/400 mL        2,000 mg 200 mL/hr over 120 Minutes Intravenous  Once 06/11/22  1047 06/11/22 1344   06/11/22 1145  ceFEPIme (MAXIPIME) 2 g in sodium chloride 0.9 % 100 mL IVPB        2 g 200 mL/hr over 30 Minutes Intravenous 2 times daily 06/11/22 1047     06/10/22 2300  vancomycin (VANCOCIN) IVPB 1000 mg/200 mL premix  Status:   Discontinued       See Hyperspace for full Linked Orders Report.   1,000 mg 200 mL/hr over 60 Minutes Intravenous Every 48 hours 06/08/22 2307 06/09/22 0922   06/10/22 2300  vancomycin (VANCOREADY) IVPB 1250 mg/250 mL  Status:  Discontinued       See Hyperspace for full Linked Orders Report.   1,250 mg 166.7 mL/hr over 90 Minutes Intravenous Every 48 hours 06/08/22 2307 06/09/22 0922   06/10/22 1800  cephALEXin (KEFLEX) capsule 500 mg  Status:  Discontinued        500 mg Oral Every 6 hours 06/10/22 1521 06/11/22 1047   06/09/22 2200  vancomycin (VANCOREADY) IVPB 750 mg/150 mL  Status:  Discontinued        750 mg 150 mL/hr over 60 Minutes Intravenous Every 24 hours 06/09/22 0923 06/10/22 1521   06/09/22 1000  ceFEPIme (MAXIPIME) 2 g in sodium chloride 0.9 % 100 mL IVPB  Status:  Discontinued        2 g 200 mL/hr over 30 Minutes Intravenous Every 12 hours 06/09/22 0916 06/10/22 1521   06/08/22 2300  vancomycin (VANCOCIN) IVPB 1000 mg/200 mL premix  Status:  Discontinued        1,000 mg 200 mL/hr over 60 Minutes Intravenous  Once 06/08/22 2257 06/08/22 2307   06/08/22 2215  vancomycin (VANCOCIN) IVPB 1000 mg/200 mL premix       See Hyperspace for full Linked Orders Report.   1,000 mg 200 mL/hr over 60 Minutes Intravenous  Once 06/08/22 2210 06/09/22 0014   06/08/22 2215  vancomycin (VANCOREADY) IVPB 1500 mg/300 mL       See Hyperspace for full Linked Orders Report.   1,500 mg 150 mL/hr over 120 Minutes Intravenous  Once 06/08/22 2210 06/10/22 0807   06/08/22 1915  piperacillin-tazobactam (ZOSYN) IVPB 3.375 g        3.375 g 100 mL/hr over 30 Minutes Intravenous  Once 06/08/22 1909 06/08/22 [facility] Events: Including procedures, antibiotic start and stop dates in addition to other pertinent events   1/20: Presented to ED, admitted by [facility] 1/21: Added cefepime, continue vancomycin, increase dose of Cardizem for better heart rate control 1/22: Had to reduce  Cardizem dose back to her home dose as blood pressure dropped 1/23: Transferred to ICU overnight due to CO2 narcosis, placed on BiPAP.  PCCM consulted.  Worsening AKI, holding diuresis.  Agitation requiring Precedex, high risk for Intubation.  Palliative Care consulted for [facility] conversations.  ABX broadened back to Cefepime and Vancomycin 1/24: Pt on Bipap but awake and able to follow commands.  Will attempt to transition off Bipap.  Palliative Care following along to assist with goals of care conversations   Interim History / Subjective:  Pt calm on Bipap no acute events overnight.  UOP overnight 262 ml.  Remains on low dose precedex gtt 0.6 mcg/kg/hr   Objective   Blood pressure 137/66, pulse 65, temperature 98.8 F (37.1 C), temperature source Axillary, resp. rate 20, height '5\' 4"'$  (1.626 m), weight (!) 158.3 kg, SpO2 100 %.    FiO2 (%):  [32 %-40 %]  32 %   Intake/Output Summary (Last 24 hours) at 06/12/2022 0748 Last data filed at 06/12/2022 4268 Gross per 24 hour  Intake 869.09 ml  Output 815 ml  Net 54.09 ml   Filed Weights   06/09/22 1139  Weight: (!) 158.3 kg    Examination: General: Acute on chronically ill-appearing obese female, laying in bed, on BiPAP, in no acute distress HENT: Supple, difficult to assess for JVD due to body habitus  Lungs: Diminished throughout, even, non labored  Cardiovascular: NSR, s1s2, no m/r/g, 2+ radial/1+ distal pulses, 1+ bilateral lower extremity edema  Abdomen: +BS x4, obese, non tender non distended  Extremities: Moves all extremities  Neuro: Awake but confused, following commands, PERRLA GU: Indwelling foley catheter drained yellow urine  Skin: Cellulitis to bilateral lower extremities, yeast excoriation to abdominal fold/groins  Resolved Hospital Problem list     Assessment & Plan:   Acute on Chronic Hypercapnic Respiratory Failure in setting of AECOPD + sepsis PMHx: OSA (noncompliant with CPAP at home), morbid obesity with probable  OHS - Supplemental O2 as needed to maintain O2 sats 88 to 92% - Prn Bipap for hypercapnia and/or hypoxia; Bipap qhs  - HIGH RISK FOR INTUBATION - Follow intermittent Chest X-ray & ABG as needed - Scheduled and prn Bronchodilators & Pulmicort nebs - IV Steroids wean as tolerated  - ABX as above - Diuresis as BP and renal function permits - Pulmonary toilet as able  Chronic HFpEF PMHx: HTN Echocardiogram 07/16/21: LVEF 55-60%, indeterminate diastolic parameters, RV systolic function normal - Continuous cardiac monitoring - Maintain MAP >65 - Diuresis as BP and renal function permits ~ holding due to worsening AKI - Echo pending   Sepsis secondary to Cellulitis of Lower extremities (Meets SIRS Criteria: HR >90, RR >20, fever) - Trend WBC and monitor fever curve - Trend PCT  - Follow cultures as above - Continue vancomycin and cefepime   Acute Kidney Injury on CKD III ~ WORSENING - Trend BMP  - Replace electrolytes as indicated  - Monitor UOP - Avoid nephrotoxic medication as able  - Low threshold for Nephrology consultation  Anemia of Chronic Disease - Trend CBC - Monitor for s/sx of bleeding  - Transfuse for hgb <7  Acute Metabolic Encephalopathy in the setting of CO2 Narcosis and multiple metabolic derangements - Treatment of Hypercapnia and metabolic derangements/sepsis as outlined above - Provide supportive care - Promote normal sleep/wake cycle and family presence - Avoid sedating meds as able - Prn precedex for agitation/delirium     Pt is critically ill with multiorgan failure superimposed on multiple chronic co morbidities and morbid obesity.  High risk for further decompensation, need for intubation, cardiac arrest and death.  If she were to be intubated, would likely need Tracheotomy.  Recommend DNR/DNI status, Palliative Care consulted to assist with goals of care. Best Practice (right click and "Reselect all SmartList Selections" daily)   Diet/type: NPO DVT  prophylaxis: prophylactic heparin  GI prophylaxis: PPI Lines: N/A Foley:  Yes, and it is still needed (due to urinary retention) Code Status:  full code Last date of multidisciplinary goals of care discussion [06/12/22]  1/24: Will update pt's son when he arrives at bedside. Labs   CBC: Recent Labs  Lab 06/08/22 1918 06/09/22 0716 06/10/22 0335 06/11/22 0337  WBC 12.9* 11.0* 10.8* 11.4*  NEUTROABS 11.3*  --   --   --   HGB 8.0* 8.8* 8.6* 8.2*  HCT 27.7* 30.6* 30.1* 29.5*  MCV 83.2 83.6 83.8  86.8  PLT 221 257 PLATELET CLUMPS NOTED ON SMEAR, UNABLE TO ESTIMATE 376    Basic Metabolic Panel: Recent Labs  Lab 06/08/22 1918 06/09/22 0716 06/10/22 0335 06/11/22 0337  NA 137 137 137 139  K 3.5 3.6 4.1 4.4  CL 96* 98 99 99  CO2 '29 31 24 29  '$ GLUCOSE 123* 97 102* 85  BUN 39* 43* 45* 52*  CREATININE 1.59* 1.92* 2.10* 2.71*  CALCIUM 7.8* 7.7* 8.1* 8.3*   GFR: Estimated Creatinine Clearance: 29.3 mL/min (A) (by C-G formula based on SCr of 2.71 mg/dL (H)). Recent Labs  Lab 06/08/22 1918 06/08/22 2109 06/09/22 0716 06/10/22 0335 06/11/22 0337  PROCALCITON  --   --  0.87  --   --   WBC 12.9*  --  11.0* 10.8* 11.4*  LATICACIDVEN 1.1 0.9  --   --   --     Liver Function Tests: Recent Labs  Lab 06/08/22 1918 06/09/22 0716  AST 17 17  ALT 8 10  ALKPHOS 54 60  BILITOT 0.6 0.6  PROT 6.6 7.2  ALBUMIN 2.4* 2.5*   No results for input(s): "LIPASE", "AMYLASE" in the last 168 hours. No results for input(s): "AMMONIA" in the last 168 hours.  ABG    Component Value Date/Time   PHART 7.27 (L) 06/11/2022 1429   PCO2ART 65 (H) 06/11/2022 1429   PO2ART 95 06/11/2022 1429   HCO3 25.2 06/12/2022 0736   ACIDBASEDEF 1.4 06/12/2022 0736   O2SAT 98.1 06/12/2022 0736     Coagulation Profile: Recent Labs  Lab 06/08/22 1918  INR 1.3*    Cardiac Enzymes: No results for input(s): "CKTOTAL", "CKMB", "CKMBINDEX", "TROPONINI" in the last 168 hours.  HbA1C: Hgb A1c MFr Bld   Date/Time Value Ref Range Status  06/08/2022 07:16 AM 5.8 (H) 4.8 - 5.6 % Final    Comment:    (NOTE) Pre diabetes:          5.7%-6.4%  Diabetes:              >6.4%  Glycemic control for   <7.0% adults with diabetes   07/15/2021 04:30 PM 5.4 4.8 - 5.6 % Final    Comment:    (NOTE) Pre diabetes:          5.7%-6.4%  Diabetes:              >6.4%  Glycemic control for   <7.0% adults with diabetes     CBG: Recent Labs  Lab 06/10/22 2103 06/10/22 2336  GLUCAP 100* 88    Review of Systems:   Unable to assess due to AMS and BiPAP  Past Medical History:  She,  has a past medical history of Acute bronchitis, Anemia, Arthritis, Asthma, B12 deficiency (03/14/2017), Chest pain (07/02/2018), Chest pain, Chronic heart failure with preserved ejection fraction (HFpEF) ([place]), COPD (chronic obstructive pulmonary disease) ([place]) (07/24/2018), Family history of blood clots (07/16/2017), Irritable bowel syndrome, Malignant hypertension, Obesity, OSA (obstructive sleep apnea), Pneumonia (2017), Restless leg syndrome, and Stroke ([place]) (1995).   Surgical History:   Past Surgical History:  Procedure Laterality Date   CHOLECYSTECTOMY     COLONOSCOPY WITH PROPOFOL N/A 03/31/2018   Procedure: COLONOSCOPY WITH PROPOFOL;  Surgeon: Lucilla Lame, MD;  [facility];  Service: Endoscopy;  Laterality: N/A;   KNEE ARTHROPLASTY Right 04/30/2019   Procedure: COMPUTER ASSISTED TOTAL KNEE ARTHROPLASTY;  Surgeon: Dereck Leep, MD;  Location: [facility] ORS;  Service: Orthopedics;  Laterality: Right;   RIGHT HEART CATH N/A 01/30/2022  Procedure: RIGHT HEART CATH;  Surgeon: Nelva Bush, MD;  Location: [facility] CV LAB;  Service: Cardiovascular;  Laterality: N/A;   saliva gland removal  1998   TOTAL KNEE ARTHROPLASTY Left 2014   TUBAL LIGATION Bilateral      Social History:   reports that she has never smoked. She has never used smokeless tobacco. She reports that she does not drink  alcohol and does not use drugs.   Family History:  Her family history includes COPD in her father; Coronary artery disease (age of onset: 108) in her brother; Dementia in her mother; Diabetes in her father; Heart failure in her brother and maternal grandmother; Stroke in her maternal grandmother. There is no history of Breast cancer.   Allergies Allergies  Allergen Reactions   Nuvigil [Armodafinil] Hives   Penicillins Diarrhea and Nausea And Vomiting    Did it involve swelling of the face/tongue/throat, SOB, or low BP? no Did it involve sudden or severe rash/hives, skin peeling, or any reaction on the inside of your mouth or nose? No Did you need to seek medical attention at a hospital or doctor's office? No When did it last happen?  in her 46s    If all above answers are "NO", may proceed with cephalosporin use.    Spironolactone Itching   Gabapentin Other (See Comments)    "wiped her out, couldn't stay awake"   Melatonin Other (See Comments)    "Brain fog"   Provigil [Modafinil] Hives   Entresto [Sacubitril-Valsartan] Itching   Lisinopril Cough     Home Medications  Prior to Admission medications   Medication Sig Start Date End Date Taking? Authorizing Provider  acetaminophen (TYLENOL) 500 MG tablet Take 1,000 mg by mouth every 6 (six) hours as needed.   Yes [provider]  benzonatate (TESSALON) 100 MG capsule TAKE 1 CAPSULE BY MOUTH THREE TIMES A DAY AS NEEDED FOR COUGH 03/08/22  Yes Tyler Pita, MD  calcitRIOL (ROCALTROL) 0.25 MCG capsule Take 0.25 mcg by mouth daily. 02/22/22  Yes [provider]  dapagliflozin propanediol (FARXIGA) 10 MG TABS tablet Take 1 tablet (10 mg total) by mouth daily before breakfast. 03/07/22  Yes Hackney, Tina A, FNP  diltiazem (CARDIZEM CD) 240 MG 24 hr capsule Take 1 capsule (240 mg total) by mouth daily. 09/05/21  Yes Theora Gianotti, NP  ipratropium-albuterol (DUONEB) 0.5-2.5 (3) MG/3ML SOLN TAKE 3 MLS BY  NEBULIZATION EVERY 6 (SIX) HOURS AS NEEDED. J45.40 03/04/22  Yes Tyler Pita, MD  metolazone (ZAROXOLYN) 2.5 MG tablet Take 1 tablet (2.5 mg total) by mouth 2 (two) times a week. Patient taking differently: Take 2.5 mg by mouth 2 (two) times a week. Monday and Friday 02/04/22  Yes Wouk, Ailene Rud, MD  ondansetron (ZOFRAN) 4 MG tablet Take 1 tablet (4 mg total) by mouth daily as needed for nausea or vomiting. 05/10/22 05/10/23 Yes Lorella Nimrod, MD  pantoprazole (PROTONIX) 40 MG tablet TAKE 1 TABLET BY MOUTH EVERY DAY 12/11/21  Yes Glean Hess, MD  PREVALITE 4 g packet DISSOLVE AND DRINK 1 PACKET BY MOUTH AT BEDTIME 03/05/22  Yes Glean Hess, MD  rOPINIRole (REQUIP) 1 MG tablet Take 1 tablet (1 mg total) by mouth 3 (three) times daily. 02/13/22  Yes Glean Hess, MD  Torsemide 40 MG TABS Take 40 mg by mouth daily.   Yes [provider]  budesonide (PULMICORT) 0.5 MG/2ML nebulizer solution TAKE 2 ML (0.5 MG TOTAL) BY NEBULIZATION TWICE A  DAY Patient not taking: Reported on 06/08/2022 10/29/21   Tyler Pita, MD  feeding supplement (ENSURE ENLIVE / ENSURE PLUS) LIQD Take 237 mLs by mouth 3 (three) times daily between meals. 05/10/22   Lorella Nimrod, MD  metoCLOPramide (REGLAN) 5 MG tablet Take 1 tablet (5 mg total) by mouth 3 (three) times daily before meals. Patient not taking: Reported on 06/08/2022 05/12/22   Sharen Hones, MD  NON FORMULARY Bipap - nightly with Oxygen 2 Lpm    [provider]  oxyCODONE 7.5 MG TABS Take 7.5 mg by mouth every 6 (six) hours as needed. 05/10/22 05/10/23  Lorella Nimrod, MD  OXYGEN Inhale into the lungs. 2 LPM [place]    [provider]  promethazine (PHENERGAN) 25 MG tablet Take 1 tablet (25 mg total) by mouth every 6 (six) hours as needed. Patient not taking: Reported on 06/08/2022 05/10/22 05/10/23  Lorella Nimrod, MD     Critical care time: 83 minutes      Donell Beers, [place] [address] [place] Pager  [phone] (please enter 7 digits) PCCM Consult Pager [phone] (please enter 7 digits)

## 2022-06-12 NOTE — Consult Note (Signed)
   [facility] Inpatient Consult   06/12/2022  DELENN AHN Oct 26, 1950 [id number]  [place] [facility] [ACO] Patient: Medicare [place] [facility] Liaison remote coverage review for patient admitted to [facility]   Primary Care Provider: Glean Hess, MD, [place] at [facility]   Patient screened for less than 30 days readmission hospitalization with noted extreme high risk score for unplanned readmission risk land to assess for potential [place] Management service needs for post hospital transition for care coordination.  Review of patient's electronic medical record reveals patient is currently at [facility] Level of care, also reviewed palliative consult..   Plan:  Continue to follow progress and disposition to assess for post hospital community care coordination/management needs.  Referral request for community care coordination: pending disposition needs.  Of note, [facility] Care Management/Population Health does not replace or interfere with any arrangements made by the Inpatient Transition of Care team.  For questions contact:   Natividad Brood, RN BSN [place]  [phone] business mobile phone Toll free office [phone]  *[place]  [phone] Fax number: [phone] [email or web address]

## 2022-06-12 NOTE — Progress Notes (Signed)
Palliative Care Progress Note, Assessment & Plan   Patient Name: Samantha Mason       Date: 06/12/2022 DOB: [birth year]  Age: 72 y.o. MRN#: [id number] Attending Physician: Sharen Hones, MD Primary Care Physician: Glean Hess, MD Admit Date: 06/08/2022  Subjective: Patient is lying in bed in no apparent distress.  She is easily arousable.  When she awakens, she continues to [facility] and say okay repeatedly.  Son is at bedside.  HPI: 72 y.o. female  with past medical history of morbid obesity, chronic HFpEF, CKD (stage III), chronic LE lymphedema (treatment of torsemide), HTN, asthma, stroke (1995), and OSA (noncompliant with CPAP) admitted on 06/08/2022 with feeling unwell for approximately 1 week, chills, generalized weakness, and fatigue.   Patient is being treated for sepsis in setting of lower extremity cellulitis, AKI, and acute on chronic hypercapnic respiratory failure in the setting of AECOPD.   1/23, patient was transferred to ICU due to CO2 narcosis and placed on BiPAP.  Patient is having worsening AKI as well as agitation requiring Precedex.  Patient is at high risk for intubation.  PMT was consulted to discuss goals of care.  Summary of counseling/coordination of care: After reviewing the patient's chart and assessing the patient at bedside, I spoke with patient and her son Samantha Mason at bedside in regards to plan and goals of care.  Medical update given. Discussed patient continues to have guarded prognosis given her high risk for intubation/cardiac arrest. Reviewed that patient is off of bipap and on 4L [place] but still not clearly mentating or showing signs of multiple organ failure improving. She continues to repeat the same word "ok" and mumble throughout our discussion.   I attempted to elicit  goals and values important to the patient. Samantha Mason shares he has spoken with his sister Izora Gala.  He shares that both he and Izora Gala are on the same page to continue with full code and full scope of care.  In addition to our discussion of her respiratory issues and high risk for intubation, we discussed patient's cellulitis. Education provided on sepsis. I attempted to show Samantha Mason his mother's current wounds and he declined at this time.    As per our discussion yesterday, patient has completed an advanced directive. Samantha Mason has not reviewed this or brought a copy to the hospital. He believes her wishes are for full code/full scope, but has not confirmed this with her documentation.   Therapeutic silence and active listening provided for Samantha Mason to share his thoughts and emotions regarding current medical situation.  Emotional support provided.  Above discussion conveyed to CCM Dr. Mortimer Fries. Discussed mental status and need for CT/MRI. But, patient too unstable to lie flat/scan at this time.  PMT will continue to follow and support patient/family.    Physical Exam Vitals reviewed.  Constitutional:      Appearance: She is obese. She is ill-appearing.  HENT:     Head: Normocephalic.     Mouth/Throat:     Mouth: Mucous membranes are moist.  Eyes:     Pupils: Pupils are equal, round, and reactive to light.  Cardiovascular:     Rate and Rhythm: Normal rate.     Pulses: Normal pulses.  Pulmonary:     Effort: Pulmonary effort is normal.  Abdominal:     Palpations: Abdomen is soft.  Musculoskeletal:     Comments: Does not MAETC             Palliative Assessment/Data: 20%    Total Time 50 minutes   Thank you for allowing the Palliative Medicine Team to assist in the care of this patient.  Vander Ilsa Iha, FNP-BC Palliative Medicine Team Team Phone # [phone]

## 2022-06-13 ENCOUNTER — Inpatient Hospital Stay: Payer: Medicare Other

## 2022-06-13 DIAGNOSIS — Z6841 Body Mass Index (BMI) 40.0 and over, adult: Secondary | ICD-10-CM

## 2022-06-13 DIAGNOSIS — A419 Sepsis, unspecified organism: Secondary | ICD-10-CM | POA: Diagnosis not present

## 2022-06-13 DIAGNOSIS — R6520 Severe sepsis without septic shock: Secondary | ICD-10-CM | POA: Diagnosis not present

## 2022-06-13 DIAGNOSIS — L0390 Cellulitis, unspecified: Secondary | ICD-10-CM | POA: Diagnosis not present

## 2022-06-13 DIAGNOSIS — Z515 Encounter for palliative care: Secondary | ICD-10-CM | POA: Diagnosis not present

## 2022-06-13 DIAGNOSIS — J189 Pneumonia, unspecified organism: Secondary | ICD-10-CM | POA: Diagnosis not present

## 2022-06-13 LAB — CBC
HCT: 32.6 % — ABNORMAL LOW (ref 36.0–46.0)
Hemoglobin: 9.7 g/dL — ABNORMAL LOW (ref 12.0–15.0)
MCH: 23.8 pg — ABNORMAL LOW (ref 26.0–34.0)
MCHC: 29.8 g/dL — ABNORMAL LOW (ref 30.0–36.0)
MCV: 79.9 fL — ABNORMAL LOW (ref 80.0–100.0)
Platelets: 247 10*3/uL (ref 150–400)
RBC: 4.08 MIL/uL (ref 3.87–5.11)
RDW: 17 % — ABNORMAL HIGH (ref 11.5–15.5)
WBC: 8.6 10*3/uL (ref 4.0–10.5)
nRBC: 0 % (ref 0.0–0.2)

## 2022-06-13 LAB — RENAL FUNCTION PANEL
Albumin: 2.6 g/dL — ABNORMAL LOW (ref 3.5–5.0)
Anion gap: 12 (ref 5–15)
BUN: 75 mg/dL — ABNORMAL HIGH (ref 8–23)
CO2: 25 mmol/L (ref 22–32)
Calcium: 8.5 mg/dL — ABNORMAL LOW (ref 8.9–10.3)
Chloride: 102 mmol/L (ref 98–111)
Creatinine, Ser: 2.49 mg/dL — ABNORMAL HIGH (ref 0.44–1.00)
GFR, Estimated: 20 mL/min — ABNORMAL LOW (ref >60)
Glucose, Bld: 157 mg/dL — ABNORMAL HIGH (ref 70–99)
Phosphorus: 5.4 mg/dL — ABNORMAL HIGH (ref 2.5–4.6)
Potassium: 4.6 mmol/L (ref 3.5–5.1)
Sodium: 139 mmol/L (ref 135–145)

## 2022-06-13 LAB — BLOOD GAS, ARTERIAL
Acid-Base Excess: 3.6 mmol/L — ABNORMAL HIGH (ref 0.0–2.0)
Bicarbonate: 30.1 mmol/L — ABNORMAL HIGH (ref 20.0–28.0)
FIO2: 60 %
MECHVT: 450 mL
O2 Saturation: 99.8 %
PEEP: 5 cmH2O
Patient temperature: 37
RATE: 16 resp/min
pCO2 arterial: 52 mmHg — ABNORMAL HIGH (ref 32–48)
pH, Arterial: 7.37 (ref 7.35–7.45)
pO2, Arterial: 126 mmHg — ABNORMAL HIGH (ref 83–108)

## 2022-06-13 LAB — GLUCOSE, CAPILLARY
Glucose-Capillary: 153 mg/dL — ABNORMAL HIGH (ref 70–99)
Glucose-Capillary: 152 mg/dL — ABNORMAL HIGH (ref 70–99)
Glucose-Capillary: 144 mg/dL — ABNORMAL HIGH (ref 70–99)
Glucose-Capillary: 139 mg/dL — ABNORMAL HIGH (ref 70–99)
Glucose-Capillary: 140 mg/dL — ABNORMAL HIGH (ref 70–99)
Glucose-Capillary: 114 mg/dL — ABNORMAL HIGH (ref 70–99)

## 2022-06-13 LAB — CULTURE, BLOOD (ROUTINE X 2)
Culture: NO GROWTH
Culture: NO GROWTH
Special Requests: ADEQUATE
Special Requests: ADEQUATE

## 2022-06-13 LAB — AMMONIA: Ammonia: 20 umol/L (ref 9–35)

## 2022-06-13 LAB — MAGNESIUM: Magnesium: 2.2 mg/dL (ref 1.7–2.4)

## 2022-06-13 MED ORDER — FUROSEMIDE 10 MG/ML IJ SOLN
80.0000 mg | Freq: Once | INTRAMUSCULAR | Status: AC
  Administered 2022-06-13: 80 mg via INTRAVENOUS
  Filled 2022-06-13: qty 8

## 2022-06-13 MED ORDER — CALCITRIOL 0.25 MCG PO CAPS
0.2500 ug | ORAL_CAPSULE | Freq: Every day | ORAL | Status: DC
  Administered 2022-06-14: 0.25 ug
  Filled 2022-06-13 (×3): qty 1

## 2022-06-13 MED ORDER — VECURONIUM BROMIDE 10 MG IV SOLR
10.0000 mg | INTRAVENOUS | Status: DC | PRN
  Administered 2022-06-14 – 2022-06-16 (×4): 10 mg via INTRAVENOUS
  Filled 2022-06-13 (×5): qty 10

## 2022-06-13 MED ORDER — VITAL HIGH PROTEIN PO LIQD
1000.0000 mL | ORAL | Status: DC
  Administered 2022-06-13 – 2022-06-18 (×7): 1000 mL

## 2022-06-13 MED ORDER — FENTANYL BOLUS VIA INFUSION
25.0000 ug | INTRAVENOUS | Status: DC | PRN
  Administered 2022-06-13 (×2): 50 ug via INTRAVENOUS
  Administered 2022-06-13: 100 ug via INTRAVENOUS

## 2022-06-13 MED ORDER — ETOMIDATE 2 MG/ML IV SOLN
20.0000 mg | Freq: Once | INTRAVENOUS | Status: AC
  Administered 2022-06-13: 20 mg via INTRAVENOUS
  Filled 2022-06-13: qty 10

## 2022-06-13 MED ORDER — FUROSEMIDE 10 MG/ML IJ SOLN
INTRAMUSCULAR | Status: AC
  Filled 2022-06-13: qty 4

## 2022-06-13 MED ORDER — NOREPINEPHRINE 4 MG/250ML-% IV SOLN
INTRAVENOUS | Status: AC
  Administered 2022-06-13: 10 ug/min via INTRAVENOUS
  Filled 2022-06-13: qty 250

## 2022-06-13 MED ORDER — MIDAZOLAM HCL 2 MG/2ML IJ SOLN
1.0000 mg | INTRAMUSCULAR | Status: DC | PRN
  Administered 2022-06-14 – 2022-06-16 (×2): 2 mg via INTRAVENOUS
  Filled 2022-06-13 (×2): qty 2

## 2022-06-13 MED ORDER — FENTANYL CITRATE PF 50 MCG/ML IJ SOSY: 25.0000 ug | PREFILLED_SYRINGE | Freq: Once | INTRAMUSCULAR | Status: DC

## 2022-06-13 MED ORDER — FENTANYL 2500MCG IN NS 250ML (10MCG/ML) PREMIX INFUSION
25.0000 ug/h | INTRAVENOUS | Status: DC
  Administered 2022-06-13: 50 ug/h via INTRAVENOUS
  Administered 2022-06-14: 200 ug/h via INTRAVENOUS
  Filled 2022-06-13 (×2): qty 250

## 2022-06-13 MED ORDER — DOCUSATE SODIUM 50 MG/5ML PO LIQD
100.0000 mg | Freq: Two times a day (BID) | ORAL | Status: DC
  Administered 2022-06-13 – 2022-06-17 (×7): 100 mg
  Filled 2022-06-13 (×7): qty 10

## 2022-06-13 MED ORDER — NOREPINEPHRINE 4 MG/250ML-% IV SOLN
2.0000 ug/min | INTRAVENOUS | Status: DC
  Filled 2022-06-13: qty 250

## 2022-06-13 MED ORDER — POLYETHYLENE GLYCOL 3350 17 G PO PACK
17.0000 g | PACK | Freq: Every day | ORAL | Status: DC
  Administered 2022-06-13 – 2022-06-17 (×4): 17 g
  Filled 2022-06-13 (×4): qty 1

## 2022-06-13 MED ORDER — ORAL CARE MOUTH RINSE
15.0000 mL | OROMUCOSAL | Status: DC
  Administered 2022-06-13 – 2022-06-20 (×82): 15 mL via OROMUCOSAL

## 2022-06-13 MED ORDER — SODIUM CHLORIDE 0.9 % IV SOLN
2.0000 g | INTRAVENOUS | Status: DC
  Administered 2022-06-13 – 2022-06-14 (×2): 2 g via INTRAVENOUS
  Filled 2022-06-13 (×2): qty 2

## 2022-06-13 MED ORDER — MIDAZOLAM HCL 2 MG/2ML IJ SOLN
INTRAMUSCULAR | Status: AC
  Administered 2022-06-13: 2 mg via INTRAVENOUS
  Filled 2022-06-13: qty 2

## 2022-06-13 MED ORDER — ROCURONIUM BROMIDE 10 MG/ML (PF) SYRINGE
100.0000 mg | PREFILLED_SYRINGE | Freq: Once | INTRAVENOUS | Status: AC
  Administered 2022-06-13: 100 mg via INTRAVENOUS
  Filled 2022-06-13: qty 10

## 2022-06-13 MED ORDER — VECURONIUM BROMIDE 10 MG IV SOLR
INTRAVENOUS | Status: AC
  Administered 2022-06-13: 10 mg via INTRAVENOUS
  Filled 2022-06-13: qty 10

## 2022-06-13 MED ORDER — FREE WATER
30.0000 mL | Status: DC
  Administered 2022-06-13 – 2022-06-19 (×34): 30 mL

## 2022-06-13 MED ORDER — PANTOPRAZOLE SODIUM 40 MG IV SOLR
40.0000 mg | INTRAVENOUS | Status: DC
  Administered 2022-06-13 – 2022-06-17 (×5): 40 mg via INTRAVENOUS
  Filled 2022-06-13 (×5): qty 10

## 2022-06-13 MED ORDER — VECURONIUM BROMIDE 10 MG IV SOLR: 10.0000 mg | Freq: Once | INTRAVENOUS | Status: AC

## 2022-06-13 MED ORDER — JUVEN PO PACK
1.0000 | PACK | Freq: Two times a day (BID) | ORAL | Status: DC
  Administered 2022-06-14 – 2022-06-19 (×11): 1

## 2022-06-13 MED ORDER — PROPOFOL 1000 MG/100ML IV EMUL
5.0000 ug/kg/min | INTRAVENOUS | Status: DC
  Administered 2022-06-13: 50 ug/kg/min via INTRAVENOUS
  Administered 2022-06-13: 30 ug/kg/min via INTRAVENOUS
  Administered 2022-06-13: 35 ug/kg/min via INTRAVENOUS
  Administered 2022-06-13: 15 ug/kg/min via INTRAVENOUS
  Administered 2022-06-14: 60 ug/kg/min via INTRAVENOUS
  Administered 2022-06-14: 40 ug/kg/min via INTRAVENOUS
  Administered 2022-06-14: 50 ug/kg/min via INTRAVENOUS
  Administered 2022-06-14 (×4): 60 ug/kg/min via INTRAVENOUS
  Filled 2022-06-13 (×11): qty 100

## 2022-06-13 MED ORDER — ROPINIROLE HCL 1 MG PO TABS
1.0000 mg | ORAL_TABLET | Freq: Three times a day (TID) | ORAL | Status: DC
  Administered 2022-06-13 – 2022-06-19 (×18): 1 mg
  Filled 2022-06-13 (×22): qty 1

## 2022-06-13 MED ORDER — MIDAZOLAM HCL 2 MG/2ML IJ SOLN: 2.0000 mg | Freq: Once | INTRAMUSCULAR | Status: AC

## 2022-06-13 MED ORDER — FENTANYL CITRATE (PF) 100 MCG/2ML IJ SOLN
100.0000 ug | Freq: Once | INTRAMUSCULAR | Status: AC
  Administered 2022-06-13: 100 ug via INTRAVENOUS
  Filled 2022-06-13: qty 2

## 2022-06-13 MED ORDER — SODIUM CHLORIDE 0.9 % IV SOLN
250.0000 mL | INTRAVENOUS | Status: DC
  Administered 2022-06-15 – 2022-06-16 (×2): 250 mL via INTRAVENOUS

## 2022-06-13 NOTE — Progress Notes (Signed)
SLP Cancellation Note  Patient Details Name: Samantha Mason MRN: [id number] DOB: [birth year]   Cancelled treatment:       Reason Eval/Treat Not Completed: Medical issues which prohibited therapy   Pt now intubated. SLP to sign off. Please reconsult as appropriate.  Cherrie Gauze, M.S., [facility] [phone] [place])  Quintella Baton 06/13/2022, 2:38 PM

## 2022-06-13 NOTE — Progress Notes (Signed)
Progress Note   Pt with very limited IV access.  She is requiring levophed and propofol gtts.  Discussed central line placement with pts son Jalan Bodi at bedside.  However, pt states he would like to wait for his sister Britani Beattie to arrive tomorrow 06/13/22.   I discussed the risk of peripheral iv's becoming nonfunctional.  Informed him if this does occur overnight we will need to contact him to obtain consent for central line placement if he wants to continue aggressive treatment.  He stated he understood.  Donell Beers, [place] Pager [phone] (please enter 7 digits) PCCM Consult Pager [phone] (please enter 7 digits)

## 2022-06-13 NOTE — Progress Notes (Signed)
Acute Hypoxic Respiratory Failure  Called bedside as patient is agitated with high Peek pressures, hypoxia and low minute ventilation. - Ventilator settings: PRVC  8 mL/kg, 50% FiO2, 5 PEEP, continue ventilator support & lung protective strategies >> oxygen increased to 100% due to desaturation into the 70's - Propofol increased to 50 without effect, ceiling increased - 2 mg of versed helped sedate the patient, however ventilation and peek pressures remain > 30 and plat pressures in the low 30's - vecuronium 10 mg IVP effective - versed & vecuronium PRN's added    Domingo Pulse [place], AGACNP-BC Acute Care Nurse Practitioner [facility]   [phone] / [phone] Please see Amion for pager details.

## 2022-06-13 NOTE — Progress Notes (Signed)
Initial Nutrition Assessment  DOCUMENTATION CODES:   Morbid obesity  INTERVENTION:   Vital HP '@60ml'$ /hr- Initiate at 22m/hr and increase by 163mhr q 8 hours until goal rate is reached.   Free water flushes 3036m4 hours to maintain tube patency   Regimen provides 1440kcal/day, 126g/day protein and 1384m57my of free water.   Daily weights   Pt at high refeed risk; recommend monitor potassium, magnesium and phosphorus labs daily until stable  Juven Fruit Punch BID via tube, each serving provides 95kcal and 2.5g of protein (amino acids glutamine and arginine)  NUTRITION DIAGNOSIS:   Inadequate oral intake related to inability to eat (pt sedated and ventilated) as evidenced by NPO status.  GOAL:   Provide needs based on ASPEN/SCCM guidelines  MONITOR:   Vent status, Labs, Weight trends, TF tolerance, I & O's, Skin  REASON FOR ASSESSMENT:   Consult Enteral/tube feeding initiation and management  ASSESSMENT:   71 y51 female with h/o morbid obesity, OSA, HTN, COPD, CHF, CKD IV, GERD, IBS, RLS and recent admission for SBO (medically managed) who is admitted with CHF, sepsis, AKI and cellulitis.  Pt sedated and ventilated. OGT in place. Will plan to initiate tube feeds today. Pt is well known to this RD from her recent admission. Pt with good appetite and oral intake at baseline but has remained NPO since admission r/t bipap. Pt is now without any nutrition for 5 days and is at refeed risk. Per chart, pt is weight stable at baseline but appears to be up ~11lbs from her UBW. Pt +3.4L on her I & Os.   Medications reviewed and include: calcitriol, colace, heparin, protonix, miralax, ceftriaxone, propofol   Labs reviewed: K 4.6 wnl, BUN 75(H), creat 2.49(H), P 5.4(H), Mg 2.2 wnl Hgb 9.7(L), Hct 32.6(L), MCV 79.9(L), MCH 23.8(L), MCHC 29.8(L) Iron 31, TIBC 253, ferritin 106- 1/24 Cbgs- 139, 144, 152, 153 x 24 hrs  AIC 5.8(H)- 1/20  Patient is currently intubated on ventilator  support MV:  L/min Temp (24hrs), Avg:97.5 F (36.4 C), Min:97 F (36.1 C), Max:98.2 F (36.8 C)  Propofol: 14.25 ml/hr- provides 376kcal/day   MAP- >65mm79m UOP- 825ml 54mTRITION - FOCUSED PHYSICAL EXAM:  Flowsheet Row Most Recent Value  Orbital Region No depletion  Upper Arm Region No depletion  Thoracic and Lumbar Region No depletion  Buccal Region No depletion  Temple Region No depletion  Clavicle Bone Region No depletion  Clavicle and Acromion Bone Region No depletion  Scapular Bone Region No depletion  Dorsal Hand No depletion  Patellar Region No depletion  Anterior Thigh Region No depletion  Posterior Calf Region No depletion  Edema (RD Assessment) Severe  Hair Reviewed  Eyes Reviewed  Mouth Reviewed  Skin Reviewed  Nails Reviewed   Diet Order:   Diet Order             Diet NPO time specified  Diet effective now                  EDUCATION NEEDS:   No education needs have been identified at this time  Skin:  Skin Assessment: Reviewed RN Assessment (Intertriginous dermatitis to abdominal pannus and inguinal folds, ecchymosis)  Last BM:  1/20  Height:   Ht Readings from Last 1 Encounters:  06/09/22 '5\' 4"'$  (1.626 m)    Weight:   Wt Readings from Last 1 Encounters:  06/09/22 (!) 158.3 kg    Ideal Body Weight:  54.5 kg  BMI:  Body  mass index is 59.91 kg/m.  Estimated Nutritional Needs:   Kcal:  1200-1400kcal/day  Protein:  120-135g/day  Fluid:  1.4-1.6L/day  Koleen Distance MS, RD, LDN Please refer to [facility] for RD and/or RD on-call/weekend/after hours pager

## 2022-06-13 NOTE — Procedures (Signed)
Endotracheal Intubation: Patient required placement of an artificial airway secondary to acute on chronic hypoxic hypercapneic respiratory failure    Consent: Emergent.    Hand washing performed prior to starting the procedure.    Medications administered for sedation prior to procedure:  Fentanyl 100 mcg IV, 20 mg Etomidate IV, Rocuronium 100 mg IV     A time out procedure was called and correct patient, name, & ID confirmed. Needed supplies and equipment were assembled and checked to include ETT, 10 ml syringe, Glidescope, Mac and Miller blades, suction, oxygen and bag mask valve, end tidal CO2 monitor.    Patient was positioned to align the mouth and pharynx to facilitate visualization of the glottis.    Heart rate, SpO2 and blood pressure was continuously monitored during the procedure. Pre-oxygenation was conducted prior to intubation and endotracheal tube was placed through the vocal cords into the trachea.       The artificial airway was placed under direct visualization via glidescope route using a 8 cm  ETT on the first attempt.   ETT was secured at 23 cm.   Placement was confirmed by auscuitation of lungs with good breath sounds bilaterally and no stomach sounds.  Condensation was noted on endotracheal tube.   Pulse ox 96%  CO2 detector in place with appropriate color change.    Complications: None .        Chest radiograph ordered and pending.   Donell Beers, [place] Pager [phone] (please enter 7 digits) PCCM Consult Pager [phone] (please enter 7 digits)

## 2022-06-13 NOTE — Progress Notes (Signed)
Palliative Care Progress Note, Assessment & Plan   Patient Name: Samantha Mason       Date: 06/13/2022 DOB: 1951/04/23  Age: 72 y.o. MRN#: [id number] Attending Physician: Flora Lipps, MD Primary Care Physician: Glean Hess, MD Admit Date: 06/08/2022  Subjective: Patient is lying in bed in no apparent distress with BiPAP in place.  Nursing is adjusting her in bed.  No family present at bedside.  HPI: 72 y.o. female  with past medical history of morbid obesity, chronic HFpEF, CKD (stage III), chronic LE lymphedema (treatment of torsemide), HTN, asthma, stroke (1995), and OSA (noncompliant with CPAP) admitted on 06/08/2022 with feeling unwell for approximately 1 week, chills, generalized weakness, and fatigue.   Patient is being treated for sepsis in setting of lower extremity cellulitis, AKI, and acute on chronic hypercapnic respiratory failure in the setting of AECOPD.   1/23, patient was transferred to ICU due to CO2 narcosis and placed on BiPAP.  Patient is having worsening AKI as well as agitation requiring Precedex.  Patient is at high risk for intubation.  PMT was consulted to discuss goals of care.  Summary of counseling/coordination of care: After reviewing the patient's chart and assessing the patient at bedside, I counseled with CCM Dr. Stoney Bang and NP Meda Coffee.  Patient is at high risk for intubation due to multisystem organ failure.  We discussed potential for CRRT/dialysis needs if patient continues to decline.  I will reattempt to meet with family when they are bedside as nursing shares they have stepped away to get something to eat.  I was updated by Dr. Stoney Bang that he spoke with family both in person and over the phone.  Decision was made that patient would be accepting of intubation and  after intubation would become a DNR status.  I am off service at [facility] after today but will ask that my colleagues continue to follow and support the patient throughout her hospitalization.  Physical Exam Vitals reviewed.  Constitutional:      General: She is not in acute distress.    Appearance: She is obese. She is ill-appearing.  HENT:     Head: Normocephalic.  Cardiovascular:     Rate and Rhythm: Normal rate.     Pulses: Normal pulses.  Pulmonary:     Comments: Increased WOB, bipap in place Musculoskeletal:     Comments: Does not MAETC  Skin:    Comments: Celulities of groin and bilateral LEs             Total Time 25 minutes   Joleene Burnham L. Ilsa Iha, FNP-BC Palliative Medicine Team Team Phone # [phone]

## 2022-06-13 NOTE — Progress Notes (Signed)
[facility]  ROUNDING NOTE   Subjective:   Samantha Mason  is a 72 year old female with past medical history including COPD with renal failure requiring 2 L oxygen as needed, chronic diastolic heart failure, sleep apnea, morbid obesity, hypertension, and chronic kidney disease stage IV. Patient presents to the emergency department feeling sick for a few days.  She was admitted for Sepsis [facility]) [A41.9] Cellulitis, unspecified cellulitis site [L03.90] Community acquired pneumonia, unspecified laterality [J18.9] Sepsis, due to unspecified organism, unspecified whether acute organ dysfunction present [facility]) [A41.9]  Patient is followed by our office outpatient. She was last seen in office on Apr 17, 2022. Patient is currently being treated for sepsis due to cellulitis. Per chart review, patient became hypoxic and cyanotic. She is seen and evaluated today in ICU. Son at bedside. Patient Alert but confused on bipap. Chest xray shows diffuse interstitial and alveolar process, likely pulmonary edema vs bilateral pneumonia.   Creatinine has increased since admission, 1.59-2.85.  Currently 2.49.    We have been consulted to manage acute kidney injury.   Objective:  Vital signs in last 24 hours:  Temp:  [97 F (36.1 C)-98.2 F (36.8 C)] 97.4 F (36.3 C) (01/25 0743) Pulse Rate:  [61-144] 65 (01/25 1000) Resp:  [14-33] 16 (01/25 1000) BP: (101-162)/(52-125) 143/58 (01/25 1000) SpO2:  [94 %-100 %] 97 % (01/25 1000) FiO2 (%):  [50 %-100 %] 50 % (01/25 0745)  Weight change:  Filed Weights   06/09/22 1139  Weight: (!) 158.3 kg    Intake/Output: I/O last 3 completed shifts: In: 2045.4 [I.V.:1795.4; IV Piggyback:250] Out: 1140 [QIONG:2952]   Intake/Output this shift:  Total I/O In: 40.4 [I.V.:40.4] Out: 0   Physical Exam: General: Restless, agitated  Head: Normocephalic, atraumatic.   Eyes: Anicteric  Lungs:  Bipap  Heart: Regular rate and rhythm  Abdomen:  Soft,  nontender  Extremities:  2+ peripheral edema.  Neurologic: Nonfocal, moving all four extremities  Skin: No lesions  Access: None    Basic Metabolic Panel: Recent Labs  Lab 06/09/22 0716 06/10/22 0335 06/11/22 0337 06/12/22 0736 06/13/22 0421  NA 137 137 139 139 139  K 3.6 4.1 4.4 4.8 4.6  CL 98 99 99 100 102  CO2 '31 24 29 22 25  '$ GLUCOSE 97 102* 85 155* 157*  BUN 43* 45* 52* 70* 75*  CREATININE 1.92* 2.10* 2.71* 2.85* 2.49*  CALCIUM 7.7* 8.1* 8.3* 8.2* 8.5*  MG  --   --   --  1.7 2.2  PHOS  --   --   --  6.6* 5.4*    Liver Function Tests: Recent Labs  Lab 06/08/22 1918 06/09/22 0716 06/12/22 0736 06/13/22 0421  AST 17 17  --   --   ALT 8 10  --   --   ALKPHOS 54 60  --   --   BILITOT 0.6 0.6  --   --   PROT 6.6 7.2  --   --   ALBUMIN 2.4* 2.5* 2.5* 2.6*   No results for input(s): "LIPASE", "AMYLASE" in the last 168 hours. Recent Labs  Lab 06/13/22 0421  AMMONIA 20    CBC: Recent Labs  Lab 06/08/22 1918 06/09/22 0716 06/10/22 0335 06/11/22 0337 06/12/22 0736 06/13/22 0421  WBC 12.9* 11.0* 10.8* 11.4* 6.1 8.6  NEUTROABS 11.3*  --   --   --   --   --   HGB 8.0* 8.8* 8.6* 8.2* 9.2* 9.7*  HCT 27.7* 30.6* 30.1* 29.5*  31.5* 32.6*  MCV 83.2 83.6 83.8 86.8 81.8 79.9*  PLT 221 257 PLATELET CLUMPS NOTED ON SMEAR, UNABLE TO ESTIMATE 250 159 247    Cardiac Enzymes: No results for input(s): "CKTOTAL", "CKMB", "CKMBINDEX", "TROPONINI" in the last 168 hours.  BNP: Invalid input(s): "POCBNP"  CBG: Recent Labs  Lab 06/10/22 2103 06/10/22 2336 06/12/22 2344 06/13/22 0330 06/13/22 0746  GLUCAP 100* 88 142* 153* 152*    Microbiology: Results for orders placed or performed during the hospital encounter of 06/08/22  Blood Culture (routine x 2)     Status: None   Collection Time: 06/08/22  7:18 PM   Specimen: BLOOD  Result Value Ref Range Status   Specimen Description BLOOD BLOOD LEFT ARM  Final   Special Requests   Final    BOTTLES DRAWN AEROBIC AND  ANAEROBIC Blood Culture adequate volume   Culture   Final    NO GROWTH 5 DAYS Performed at [facility], [address], [place], [place] [address]    Report Status 06/13/2022 FINAL  Final  Blood Culture (routine x 2)     Status: None   Collection Time: 06/08/22  7:18 PM   Specimen: BLOOD  Result Value Ref Range Status   Specimen Description BLOOD BLOOD RIGHT ARM  Final   Special Requests   Final    BOTTLES DRAWN AEROBIC AND ANAEROBIC Blood Culture adequate volume   Culture   Final    NO GROWTH 5 DAYS Performed at [facility], [address] [place], [place] [address]    Report Status 06/13/2022 FINAL  Final  Resp panel by RT-PCR (RSV, Flu A&B, Covid) Anterior Nasal Swab     Status: None   Collection Time: 06/08/22  7:18 PM   Specimen: Anterior Nasal Swab  Result Value Ref Range Status   SARS Coronavirus 2 by RT PCR NEGATIVE NEGATIVE Final    Comment: (NOTE) SARS-CoV-2 target nucleic acids are NOT DETECTED.  The SARS-CoV-2 RNA is generally detectable in upper respiratory specimens during the acute phase of infection. The lowest concentration of SARS-CoV-2 viral copies this assay can detect is 138 copies/mL. A negative result does not preclude SARS-Cov-2 infection and should not be used as the sole basis for treatment or other patient management decisions. A negative result may occur with  improper specimen collection/handling, submission of specimen other than nasopharyngeal swab, presence of viral mutation(s) within the areas targeted by this assay, and inadequate number of viral copies(<138 copies/mL). A negative result must be combined with clinical observations, patient history, and epidemiological information. The expected result is Negative.  Fact Sheet for Patients:  EntrepreneurPulse.com.au  Fact Sheet for Healthcare Providers:  IncredibleEmployment.be  This test is no t yet approved or cleared by the [place] FDA and  has been authorized for detection and/or diagnosis of SARS-CoV-2 by FDA under an Emergency Use Authorization (EUA). This EUA will remain  in effect (meaning this test can be used) for the duration of the COVID-19 declaration under Section 564(b)(1) of the Act, 21 U.S.C.section 360bbb-3(b)(1), unless the authorization is terminated  or revoked sooner.       Influenza A by PCR NEGATIVE NEGATIVE Final   Influenza B by PCR NEGATIVE NEGATIVE Final    Comment: (NOTE) The Xpert Xpress SARS-CoV-2/FLU/RSV plus assay is intended as an aid in the diagnosis of influenza from Nasopharyngeal swab specimens and should not be used as a sole basis for treatment. Nasal washings and aspirates are unacceptable for Xpert Xpress SARS-CoV-2/FLU/RSV  testing.  Fact Sheet for Patients: EntrepreneurPulse.com.au  Fact Sheet for Healthcare Providers: IncredibleEmployment.be  This test is not yet approved or cleared by the [place] FDA and has been authorized for detection and/or diagnosis of SARS-CoV-2 by FDA under an Emergency Use Authorization (EUA). This EUA will remain in effect (meaning this test can be used) for the duration of the COVID-19 declaration under Section 564(b)(1) of the Act, 21 U.S.C. section 360bbb-3(b)(1), unless the authorization is terminated or revoked.     Resp Syncytial Virus by PCR NEGATIVE NEGATIVE Final    Comment: (NOTE) Fact Sheet for Patients: EntrepreneurPulse.com.au  Fact Sheet for Healthcare Providers: IncredibleEmployment.be  This test is not yet approved or cleared by the [place] FDA and has been authorized for detection and/or diagnosis of SARS-CoV-2 by FDA under an Emergency Use Authorization (EUA). This EUA will remain in effect (meaning this test can be used) for the duration of the COVID-19 declaration under Section 564(b)(1) of the Act, 21 U.S.C. section  360bbb-3(b)(1), unless the authorization is terminated or revoked.  Performed at [facility], [address], [place], [place] [address]   Urine Culture     Status: None   Collection Time: 06/08/22  9:34 PM   Specimen: In/Out Cath Urine  Result Value Ref Range Status   Specimen Description   Final    IN/OUT CATH URINE Performed at [facility], [address], [place], [place] [address]    Special Requests   Final    NONE Performed at [facility], [address] [place], [place] [address]    Culture   Final    NO GROWTH Performed at [facility], [place] [address], [place], [place] [address]    Report Status 06/10/2022 FINAL  Final  MRSA Next Gen by PCR, Nasal     Status: None   Collection Time: 06/10/22 11:49 PM   Specimen: Nasal Mucosa; Nasal Swab  Result Value Ref Range Status   MRSA by PCR Next Gen NOT DETECTED NOT DETECTED Final    Comment: (NOTE) The GeneXpert MRSA Assay (FDA approved for NASAL specimens only), is one component of a comprehensive MRSA colonization surveillance program. It is not intended to diagnose MRSA infection nor to guide or monitor treatment for MRSA infections. Test performance is not FDA approved in patients less than 62 years old. Performed at [facility], [place],  [address]   Respiratory (~20 pathogens) panel by PCR     Status: None   Collection Time: 06/12/22  6:15 AM   Specimen: Nasopharyngeal Swab; Respiratory  Result Value Ref Range Status   Adenovirus NOT DETECTED NOT DETECTED Final   Coronavirus 229E NOT DETECTED NOT DETECTED Final    Comment: (NOTE) The Coronavirus on the Respiratory Panel, DOES NOT test for the novel  Coronavirus (2019 nCoV)    Coronavirus HKU1 NOT DETECTED NOT DETECTED Final   Coronavirus NL63 NOT DETECTED NOT DETECTED Final   Coronavirus OC43 NOT DETECTED NOT DETECTED Final   Metapneumovirus NOT DETECTED NOT DETECTED Final   Rhinovirus /  Enterovirus NOT DETECTED NOT DETECTED Final   Influenza A NOT DETECTED NOT DETECTED Final   Influenza B NOT DETECTED NOT DETECTED Final   Parainfluenza Virus 1 NOT DETECTED NOT DETECTED Final   Parainfluenza Virus 2 NOT DETECTED NOT DETECTED Final   Parainfluenza Virus 3 NOT DETECTED NOT DETECTED Final   Parainfluenza Virus 4 NOT DETECTED NOT DETECTED Final   Respiratory Syncytial Virus  NOT DETECTED NOT DETECTED Final   Bordetella pertussis NOT DETECTED NOT DETECTED Final   Bordetella Parapertussis NOT DETECTED NOT DETECTED Final   Chlamydophila pneumoniae NOT DETECTED NOT DETECTED Final   Mycoplasma pneumoniae NOT DETECTED NOT DETECTED Final    Comment: Performed at [facility], [place] [address], [place], [place] [address]    Coagulation Studies: No results for input(s): "LABPROT", "INR" in the last 72 hours.  Urinalysis: No results for input(s): "COLORURINE", "LABSPEC", "PHURINE", "GLUCOSEU", "HGBUR", "BILIRUBINUR", "KETONESUR", "PROTEINUR", "UROBILINOGEN", "NITRITE", "LEUKOCYTESUR" in the last 72 hours.  Invalid input(s): "APPERANCEUR"    Imaging: DG Chest Port 1 View  Result Date: 06/13/2022 CLINICAL DATA:  Respiratory failure, hypoxia EXAM: PORTABLE CHEST 1 VIEW COMPARISON:  06/08/2022 FINDINGS: The heart size and mediastinal worsening diffuse interstitial and alveolar opacities consistent with pulmonary edema or bilateral pneumonia. Elevated right hemidiaphragm. Possible small pleural effusions. No pneumothorax. Calcified aorta. Thoracic degenerative changes. IMPRESSION: Worsening diffuse interstitial and alveolar process most likely pulmonary edema versus extensive bilateral pneumonia. Electronically Signed   By: Sammie Bench M.D.   On: 06/13/2022 08:40   US RENAL  Result Date: 06/12/2022 CLINICAL DATA:  Acute renal failure EXAM: RENAL / URINARY TRACT ULTRASOUND COMPLETE COMPARISON:  CT chest abdomen and pelvis 06/08/2022 FINDINGS: Right Kidney: Renal measurements: 9.7  x 4.3 x 3.8 cm = volume: 83 mL. Echogenicity is within normal limits. No mass or hydronephrosis visualized. Left Kidney: Renal measurements: 8.9 x 5.0 x 4.4 cm = volume: 103 mL. Echogenicity is within normal limits. No mass or hydronephrosis visualized. Bladder: Appears normal for degree of bladder distention. Other: Trace free fluid in the pelvis. IMPRESSION: 1. No hydronephrosis. 2. Trace free fluid in the pelvis. Electronically Signed   By: Ronney Asters M.D.   On: 06/12/2022 18:06     Medications:    cefTRIAXone (ROCEPHIN)  IV 2 g (06/13/22 0825)   dexmedetomidine (PRECEDEX) IV infusion 1.2 mcg/kg/hr (06/13/22 0822)    budesonide (PULMICORT) nebulizer solution  0.5 mg Nebulization BID   calcitRIOL  0.25 mcg Oral Daily   Chlorhexidine Gluconate Cloth  6 each Topical Daily   diltiazem  240 mg Oral Daily   fluocinonide-emollient   Topical QODAY   heparin  5,000 Units Subcutaneous Q8H   ipratropium-albuterol  3 mL Nebulization Q6H   mouth rinse  15 mL Mouth Rinse 4 times per day   pantoprazole  40 mg Oral Daily   rOPINIRole  1 mg Oral TID   benzonatate, ipratropium-albuterol, morphine injection, ondansetron **OR** ondansetron (ZOFRAN) IV, mouth rinse, oxyCODONE  Assessment/ Plan:  Samantha Mason is a 72 y.o.  female  with past medical history including COPD with renal failure requiring 2 L oxygen as needed, chronic diastolic heart failure, sleep apnea, morbid obesity, hypertension, and chronic kidney disease stage IV. Patient presents to the emergency department feeling sick and was admitted for Sepsis [facility]) [A41.9] Cellulitis, unspecified cellulitis site [L03.90] Community acquired pneumonia, unspecified laterality [J18.9] Sepsis, due to unspecified organism, unspecified whether acute organ dysfunction present [facility]) [A41.9]   Acute Kidney Injury on chronic kidney disease stage IIIb with baseline creatinine 1.55 and GFR of 36 on 04/17/22.  Acute kidney injury secondary to severe  infection. No IV contrast exposure.  Diuretics held. Lower extremity edema present, however patient has history of lymphedema. Chest xray questionable to pulmonary edema. Will order trial dose of Furosemide '80mg'$  once. If patient responds, will consider Furosemide drip. No acute need for dialysis. Will monitor renal function for  now.    Lab Results  Component Value Date   CREATININE 2.49 (H) 06/13/2022   CREATININE 2.85 (H) 06/12/2022   CREATININE 2.71 (H) 06/11/2022    Intake/Output Summary (Last 24 hours) at 06/13/2022 1029 Last data filed at 06/13/2022 0821 Gross per 24 hour  Intake 1823.56 ml  Output 825 ml  Net 998.56 ml   2. Anemia of chronic kidney disease Lab Results  Component Value Date   HGB 9.7 (L) 06/13/2022    Hgb remains acceptable, will monitor for need of ESA.  3.  Chronic diastolic heart failure.  Echo from 07/16/21 shows EF 55 to 60% with mild LVH.  Currently awaiting recent ECHO.  4. Secondary Hyperparathyroidism: with outpatient labs: PTH 79, phosphorus 3.0, calcium 10.0 on 04/17/22.   Will continue to monitor bone minerals during this admission.    LOS: 5   1/25/202410:29 AM

## 2022-06-13 NOTE — Progress Notes (Addendum)
NAME:  Samantha Mason, MRN:  [id number], DOB:  06/04/1950, LOS: 5 ADMISSION DATE:  06/08/2022, CONSULTATION DATE:  06/11/2022 REFERRING MD:  Dr. Manuella Ghazi, CHIEF COMPLAINT:  Altered Mental Status, Hypercapnia  Brief Pt Description / Synopsis:  72 y.o. female with PMHx significant for HFpEF, COPD, OSA (noncompliant with CPAP), CKD Stage III who is  admitted with Sepsis in setting of lower extremity Cellulitis, AKI, and  Acute on Chronic Hypercapnic Respiratory Failure in setting AECOPD.  Course complicated by CO2 narcosis requiring BiPAP, high risk for intubation.  History of Present Illness:  Samantha Mason is a 72 y.o. female with medical history significant for Morbid obesity, chronic HFpEF,CKD stage III, chronic lower extremity lymphedema on treatment with torsemide, hypertension, asthma, stroke (1995) and obstructive sleep apnea(non compliant with cpap) who presented to [facility] ED on 06/08/22 due to shortness of breath and generalized malaise for appropriately 1 week.  Pt is currently altered and no family currently available, therefore history is obtained from chart review.  Per ED and nursing notes, she reported about a 1 week history of feeling weak and fatigued, chills, bilateral lower extremity swelling and erythema (R>L), paroxsymal nocturnal dyspnea and orthopnea, weight gain with increased oxygen requirements.  She denied abdominal pain, N/V/D, dysuria.  ED Course: Initial Vital Signs: 105F orally, BP 129/53, HR 109, RR 27, SpO2 96% Significant Labs: BUN 39, Creatinine 1.59, Albumin 2.4, lactic acid 1.1, WBC 12.9, Hgb 8.0, Hct 27.7, Hgb A1c 5.8 COVID/FLU/RSV negative UA negative for UTI Imaging Chest X-ray>>IMPRESSION: Mild cardiomegaly with central pulmonary vascular congestion. CT Chest/Abdomen/Pelvis>> IMPRESSION: 1. Skin thickening and soft tissue edema of the partially imaged proximal right thigh. Correlate for cellulitis. 2. Newly enlarged bilateral inguinal and iliac lymph  nodes, likely reactive. 3. Diffuse bilateral bronchial wall thickening and mosaic attenuation of the airspaces, consistent with nonspecific infectious or inflammatory bronchitis. 4. Cardiomegaly.  She met Sepsis criteria, therefore she received IV fluids and antibiotics. Hospitalists were asked to admit for further workup and treatment os sepsis due to cellulitis.  Please see "significant hospital events" section below for full detailed hospital course.  Pertinent  Medical History   Past Medical History:  Diagnosis Date   Acute bronchitis    Anemia    Arthritis    Asthma    B12 deficiency 03/14/2017   Chest pain 07/02/2018   Chest pain    a. 06/2018 MV: EF 66%. No ischemia/infarct; b. 02/2021 PET CT: EF>60%, no ischemia/infarct.   Chronic heart failure with preserved ejection fraction (HFpEF) ([place])    a. 08/2020 Echo: EF 55-60%, GrI DD; b. 06/2021 Echo: EF 55-60%, mild LVH, nl RV fxn, no signif valvular dzs.   COPD (chronic obstructive pulmonary disease) ([place]) 07/24/2018   Family history of blood clots 07/16/2017   Irritable bowel syndrome    Malignant hypertension    Obesity    OSA (obstructive sleep apnea)    uses cpap   Pneumonia 2017   Restless leg syndrome    Stroke [facility]) 1995   mild     Micro Data:  1/20: SARS-CoV-2/influenza/RSV PCR>> negative 1/20: Blood culture x 2>> no growth to date 1/20: Urine>> negative 1/22: MRSA PCR>>negative  1/22: Respiratory viral panel>>negative   Antimicrobials:   Anti-infectives (From admission, onward)    Start     Dose/Rate Route Frequency Ordered Stop   06/13/22 1000  ceFEPIme (MAXIPIME) 2 g in sodium chloride 0.9 % 100 mL IVPB        2 g 200 mL/hr  over 30 Minutes Intravenous Every 24 hours 06/12/22 1430     06/11/22 1145  vancomycin (VANCOREADY) IVPB 2000 mg/400 mL        2,000 mg 200 mL/hr over 120 Minutes Intravenous  Once 06/11/22 1047 06/11/22 1344   06/11/22 1145  ceFEPIme (MAXIPIME) 2 g in sodium chloride 0.9 % 100  mL IVPB  Status:  Discontinued        2 g 200 mL/hr over 30 Minutes Intravenous 2 times daily 06/11/22 1047 06/12/22 1430   06/10/22 2300  vancomycin (VANCOCIN) IVPB 1000 mg/200 mL premix  Status:  Discontinued       See Hyperspace for full Linked Orders Report.   1,000 mg 200 mL/hr over 60 Minutes Intravenous Every 48 hours 06/08/22 2307 06/09/22 0922   06/10/22 2300  vancomycin (VANCOREADY) IVPB 1250 mg/250 mL  Status:  Discontinued       See Hyperspace for full Linked Orders Report.   1,250 mg 166.7 mL/hr over 90 Minutes Intravenous Every 48 hours 06/08/22 2307 06/09/22 0922   06/10/22 1800  cephALEXin (KEFLEX) capsule 500 mg  Status:  Discontinued        500 mg Oral Every 6 hours 06/10/22 1521 06/11/22 1047   06/09/22 2200  vancomycin (VANCOREADY) IVPB 750 mg/150 mL  Status:  Discontinued        750 mg 150 mL/hr over 60 Minutes Intravenous Every 24 hours 06/09/22 0923 06/10/22 1521   06/09/22 1000  ceFEPIme (MAXIPIME) 2 g in sodium chloride 0.9 % 100 mL IVPB  Status:  Discontinued        2 g 200 mL/hr over 30 Minutes Intravenous Every 12 hours 06/09/22 0916 06/10/22 1521   06/08/22 2300  vancomycin (VANCOCIN) IVPB 1000 mg/200 mL premix  Status:  Discontinued        1,000 mg 200 mL/hr over 60 Minutes Intravenous  Once 06/08/22 2257 06/08/22 2307   06/08/22 2215  vancomycin (VANCOCIN) IVPB 1000 mg/200 mL premix       See Hyperspace for full Linked Orders Report.   1,000 mg 200 mL/hr over 60 Minutes Intravenous  Once 06/08/22 2210 06/09/22 0014   06/08/22 2215  vancomycin (VANCOREADY) IVPB 1500 mg/300 mL       See Hyperspace for full Linked Orders Report.   1,500 mg 150 mL/hr over 120 Minutes Intravenous  Once 06/08/22 2210 06/10/22 0807   06/08/22 1915  piperacillin-tazobactam (ZOSYN) IVPB 3.375 g        3.375 g 100 mL/hr over 30 Minutes Intravenous  Once 06/08/22 1909 06/08/22 [place] [facility] Events: Including procedures, antibiotic start and stop dates in  addition to other pertinent events   1/20: Presented to ED, admitted by [facility] 1/21: Added cefepime, continue vancomycin, increase dose of Cardizem for better heart rate control 1/22: Had to reduce Cardizem dose back to her home dose as blood pressure dropped 1/23: Transferred to ICU overnight due to CO2 narcosis, placed on BiPAP.  PCCM consulted.  Worsening AKI, holding diuresis.  Agitation requiring Precedex, high risk for Intubation.  Palliative Care consulted for [place] conversations.  ABX broadened back to Cefepime and Vancomycin 1/24: Pt on Bipap but awake and able to follow commands.  Will attempt to transition off Bipap.  Palliative Care following along to assist with goals of care conversations  1/25: Pt currently on Bipap and precedex gtt.  Required 2.5 mg iv valium overnight for delirium.  Unable to obtain CT Head due to respiratory  status    Interim History / Subjective:  As outlined above   Objective   Blood pressure (!) 162/65, pulse 66, temperature (!) 97 F (36.1 C), temperature source Axillary, resp. rate 17, height '5\' 4"'$  (1.626 m), weight (!) 158.3 kg, SpO2 96 %.    FiO2 (%):  [32 %-100 %] 50 %   Intake/Output Summary (Last 24 hours) at 06/13/2022 1749 Last data filed at 06/13/2022 0700 Gross per 24 hour  Intake 1783.21 ml  Output 825 ml  Net 958.21 ml   Filed Weights   06/09/22 1139  Weight: (!) 158.3 kg    Examination: General: Acute on chronically ill-appearing obese female, laying in bed, on BiPAP, in no acute distress HENT: Supple, no JVD present  Lungs: Diminished throughout, even, non labored  Cardiovascular: NSR, s1s2, no m/r/g, 2+ radial/1+ distal pulses, no edema  Abdomen: +BS x4, obese, mild distension, mild tenderness present   Extremities: Moves all extremities with stimulation  Neuro: Sedated, not following commands, withdraws from painful stimulation  GU: Indwelling foley catheter drained yellow urine  Skin: Cellulitis to bilateral lower extremities,  yeast excoriation to abdominal fold/groins  Resolved Hospital Problem list     Assessment & Plan:   Acute on Chronic Hypercapnic Respiratory Failure in setting of AECOPD and acute CHF exacerbation  PMHx: OSA (noncompliant with CPAP at home), morbid obesity with probable OHS - Supplemental O2 as needed to maintain O2 sats 88 to 92% - Prn Bipap for hypercapnia and/or hypoxia; Bipap qhs  - HIGH RISK FOR INTUBATION - Follow intermittent Chest X-ray & ABG as needed - Scheduled and prn Bronchodilators & Pulmicort nebs - Steroids discontinued 01/25  - ABX as above - Diuresis as BP and renal function permits - Pulmonary toilet as able  Chronic HFpEF PMHx: HTN Echocardiogram 07/16/21: LVEF 55-60%, indeterminate diastolic parameters, RV systolic function normal - Continuous cardiac monitoring - Maintain MAP >65 - Diuresis as BP and renal function permits  - Echo results pending   Sepsis secondary to Cellulitis of Lower extremities (Meets SIRS Criteria: HR >90, RR >20, fever) - Trend WBC and monitor fever curve - Trend PCT  - Follow cultures as above - Due to encephalopathy will stop cefepime and start ceftriaxone for 7 day course; continue vancomycin   Acute Kidney Injury on CKD III ~ Improving  - Trend BMP  - Replace electrolytes as indicated  - Monitor UOP - Avoid nephrotoxic medication as able  - Nephrology consulted appreciate input   Anemia of Chronic Disease - Trend CBC - Monitor for s/sx of bleeding  - Transfuse for hgb <7  Acute Metabolic Encephalopathy in the setting of CO2 Narcosis and multiple metabolic derangements - Treatment of Hypercapnia and metabolic derangements/sepsis as outlined above - Provide supportive care - Promote normal sleep/wake cycle and family presence - Avoid sedating meds as able - Prn precedex for agitation/delirium     Pt is critically ill with multiorgan failure superimposed on multiple chronic co morbidities and morbid obesity.  High  risk for further decompensation, need for intubation, cardiac arrest and death.  If she were to be intubated, would likely need Tracheotomy.  Recommend DNR/DNI status, Palliative Care consulted to assist with goals of care. Best Practice (right click and "Reselect all SmartList Selections" daily)   Diet/type: NPO DVT prophylaxis: prophylactic heparin  GI prophylaxis: PPI Lines: N/A Foley:  Pending foley catheter placement for strict I&O's Code Status:  full code Last date of multidisciplinary goals of care discussion [06/13/22]  1/25:  Will update pt's son when he arrives at bedside. Labs   CBC: Recent Labs  Lab 06/08/22 1918 06/09/22 0716 06/10/22 0335 06/11/22 0337 06/12/22 0736 06/13/22 0421  WBC 12.9* 11.0* 10.8* 11.4* 6.1 8.6  NEUTROABS 11.3*  --   --   --   --   --   HGB 8.0* 8.8* 8.6* 8.2* 9.2* 9.7*  HCT 27.7* 30.6* 30.1* 29.5* 31.5* 32.6*  MCV 83.2 83.6 83.8 86.8 81.8 79.9*  PLT 221 257 PLATELET CLUMPS NOTED ON SMEAR, UNABLE TO ESTIMATE 250 159 734    Basic Metabolic Panel: Recent Labs  Lab 06/09/22 0716 06/10/22 0335 06/11/22 0337 06/12/22 0736 06/13/22 0421  NA 137 137 139 139 139  K 3.6 4.1 4.4 4.8 4.6  CL 98 99 99 100 102  CO2 '31 24 29 22 25  '$ GLUCOSE 97 102* 85 155* 157*  BUN 43* 45* 52* 70* 75*  CREATININE 1.92* 2.10* 2.71* 2.85* 2.49*  CALCIUM 7.7* 8.1* 8.3* 8.2* 8.5*  MG  --   --   --  1.7 2.2  PHOS  --   --   --  6.6* 5.4*   GFR: Estimated Creatinine Clearance: 31.9 mL/min (A) (by C-G formula based on SCr of 2.49 mg/dL (H)). Recent Labs  Lab 06/08/22 1918 06/08/22 2109 06/09/22 0716 06/10/22 0335 06/11/22 0337 06/12/22 0736 06/13/22 0421  PROCALCITON  --   --  0.87  --   --   --   --   WBC 12.9*  --  11.0* 10.8* 11.4* 6.1 8.6  LATICACIDVEN 1.1 0.9  --   --   --   --   --     Liver Function Tests: Recent Labs  Lab 06/08/22 1918 06/09/22 0716 06/12/22 0736 06/13/22 0421  AST 17 17  --   --   ALT 8 10  --   --   ALKPHOS 54 60  --    --   BILITOT 0.6 0.6  --   --   PROT 6.6 7.2  --   --   ALBUMIN 2.4* 2.5* 2.5* 2.6*   No results for input(s): "LIPASE", "AMYLASE" in the last 168 hours. Recent Labs  Lab 06/13/22 0421  AMMONIA 20    ABG    Component Value Date/Time   PHART 7.27 (L) 06/11/2022 1429   PCO2ART 65 (H) 06/11/2022 1429   PO2ART 95 06/11/2022 1429   HCO3 25.2 06/12/2022 0736   ACIDBASEDEF 1.4 06/12/2022 0736   O2SAT 98.1 06/12/2022 0736     Coagulation Profile: Recent Labs  Lab 06/08/22 1918  INR 1.3*    Cardiac Enzymes: No results for input(s): "CKTOTAL", "CKMB", "CKMBINDEX", "TROPONINI" in the last 168 hours.  HbA1C: Hgb A1c MFr Bld  Date/Time Value Ref Range Status  06/08/2022 07:16 AM 5.8 (H) 4.8 - 5.6 % Final    Comment:    (NOTE) Pre diabetes:          5.7%-6.4%  Diabetes:              >6.4%  Glycemic control for   <7.0% adults with diabetes   07/15/2021 04:30 PM 5.4 4.8 - 5.6 % Final    Comment:    (NOTE) Pre diabetes:          5.7%-6.4%  Diabetes:              >6.4%  Glycemic control for   <7.0% adults with diabetes     CBG: Recent Labs  Lab 06/10/22 2103 06/10/22  2336 06/12/22 2344 06/13/22 0330  GLUCAP 100* 88 142* 153*    Review of Systems:   Unable to assess due to AMS and BiPAP  Past Medical History:  She,  has a past medical history of Acute bronchitis, Anemia, Arthritis, Asthma, B12 deficiency (03/14/2017), Chest pain (07/02/2018), Chest pain, Chronic heart failure with preserved ejection fraction (HFpEF) ([place]), COPD (chronic obstructive pulmonary disease) ([place]) (07/24/2018), Family history of blood clots (07/16/2017), Irritable bowel syndrome, Malignant hypertension, Obesity, OSA (obstructive sleep apnea), Pneumonia (2017), Restless leg syndrome, and Stroke ([place]) (1995).   Surgical History:   Past Surgical History:  Procedure Laterality Date   CHOLECYSTECTOMY     COLONOSCOPY WITH PROPOFOL N/A 03/31/2018   Procedure: COLONOSCOPY WITH PROPOFOL;   Surgeon: Lucilla Lame, MD;  Location[facility] ENDOSCOPY;  Service: Endoscopy;  Laterality: N/A;   KNEE ARTHROPLASTY Right 04/30/2019   Procedure: COMPUTER ASSISTED TOTAL KNEE ARTHROPLASTY;  Surgeon: Dereck Leep, MD;  Location: [facility] ORS;  Service: Orthopedics;  Laterality: Right;   RIGHT HEART CATH N/A 01/30/2022   Procedure: RIGHT HEART CATH;  Surgeon: Nelva Bush, MD;  Location: [place] CV LAB;  Service: Cardiovascular;  Laterality: N/A;   saliva gland removal  1998   TOTAL KNEE ARTHROPLASTY Left 2014   TUBAL LIGATION Bilateral      Social History:   reports that she has never smoked. She has never used smokeless tobacco. She reports that she does not drink alcohol and does not use drugs.   Family History:  Her family history includes COPD in her father; Coronary artery disease (age of onset: 52) in her brother; Dementia in her mother; Diabetes in her father; Heart failure in her brother and maternal grandmother; Stroke in her maternal grandmother. There is no history of Breast cancer.   Allergies Allergies  Allergen Reactions   Nuvigil [Armodafinil] Hives   Penicillins Diarrhea and Nausea And Vomiting    Did it involve swelling of the face/tongue/throat, SOB, or low BP? no Did it involve sudden or severe rash/hives, skin peeling, or any reaction on the inside of your mouth or nose? No Did you need to seek medical attention at a hospital or doctor's office? No When did it last happen?  in her 46s    If all above answers are "NO", may proceed with cephalosporin use.    Spironolactone Itching   Gabapentin Other (See Comments)    "wiped her out, couldn't stay awake"   Melatonin Other (See Comments)    "Brain fog"   Provigil [Modafinil] Hives   Entresto [Sacubitril-Valsartan] Itching   Lisinopril Cough     Home Medications  Prior to Admission medications   Medication Sig Start Date End Date Taking? Authorizing Provider  acetaminophen (TYLENOL) 500 MG tablet Take 1,000  mg by mouth every 6 (six) hours as needed.   Yes [provider]  benzonatate (TESSALON) 100 MG capsule TAKE 1 CAPSULE BY MOUTH THREE TIMES A DAY AS NEEDED FOR COUGH 03/08/22  Yes Tyler Pita, MD  calcitRIOL (ROCALTROL) 0.25 MCG capsule Take 0.25 mcg by mouth daily. 02/22/22  Yes [provider]  dapagliflozin propanediol (FARXIGA) 10 MG TABS tablet Take 1 tablet (10 mg total) by mouth daily before breakfast. 03/07/22  Yes Hackney, Tina A, FNP  diltiazem (CARDIZEM CD) 240 MG 24 hr capsule Take 1 capsule (240 mg total) by mouth daily. 09/05/21  Yes Theora Gianotti, NP  ipratropium-albuterol (DUONEB) 0.5-2.5 (3) MG/3ML SOLN TAKE 3 MLS BY NEBULIZATION EVERY 6 (SIX) HOURS  AS NEEDED. J45.40 03/04/22  Yes Tyler Pita, MD  metolazone (ZAROXOLYN) 2.5 MG tablet Take 1 tablet (2.5 mg total) by mouth 2 (two) times a week. Patient taking differently: Take 2.5 mg by mouth 2 (two) times a week. Monday and Friday 02/04/22  Yes Wouk, Ailene Rud, MD  ondansetron (ZOFRAN) 4 MG tablet Take 1 tablet (4 mg total) by mouth daily as needed for nausea or vomiting. 05/10/22 05/10/23 Yes Lorella Nimrod, MD  pantoprazole (PROTONIX) 40 MG tablet TAKE 1 TABLET BY MOUTH EVERY DAY 12/11/21  Yes Glean Hess, MD  PREVALITE 4 g packet DISSOLVE AND DRINK 1 PACKET BY MOUTH AT BEDTIME 03/05/22  Yes Glean Hess, MD  rOPINIRole (REQUIP) 1 MG tablet Take 1 tablet (1 mg total) by mouth 3 (three) times daily. 02/13/22  Yes Glean Hess, MD  Torsemide 40 MG TABS Take 40 mg by mouth daily.   Yes [provider]  budesonide (PULMICORT) 0.5 MG/2ML nebulizer solution TAKE 2 ML (0.5 MG TOTAL) BY NEBULIZATION TWICE A DAY Patient not taking: Reported on 06/08/2022 10/29/21   Tyler Pita, MD  feeding supplement (ENSURE ENLIVE / ENSURE PLUS) LIQD Take 237 mLs by mouth 3 (three) times daily between meals. 05/10/22   Lorella Nimrod, MD  metoCLOPramide (REGLAN) 5 MG tablet Take 1 tablet  (5 mg total) by mouth 3 (three) times daily before meals. Patient not taking: Reported on 06/08/2022 05/12/22   Sharen Hones, MD  NON FORMULARY Bipap - nightly with Oxygen 2 Lpm    [provider]  oxyCODONE 7.5 MG TABS Take 7.5 mg by mouth every 6 (six) hours as needed. 05/10/22 05/10/23  Lorella Nimrod, MD  OXYGEN Inhale into the lungs. 2 LPM [place]    [provider]  promethazine (PHENERGAN) 25 MG tablet Take 1 tablet (25 mg total) by mouth every 6 (six) hours as needed. Patient not taking: Reported on 06/08/2022 05/10/22 05/10/23  Lorella Nimrod, MD     Critical care time: 34 minutes      Donell Beers, [place] Pager [phone] (please enter 7 digits) PCCM Consult Pager [phone] (please enter 7 digits)

## 2022-06-13 NOTE — IPAL (Signed)
  Interdisciplinary Goals of Care Family Meeting   Date carried out: 06/13/2022    Member's involved: Physician, Education officer, museum, and Family Member or next of kin      GOALS OF CARE DISCUSSION  The Clinical status was relayed to family in detail-Location of the meeting:Son at  Bedside Daughter nancy On phone  Updated and notified of patients medical condition- Patient remains unresponsive and will not open eyes to command.   Patient is having a weak cough and struggling to remove secretions.   Patient with increased WOB and using accessory muscles to breathe Explained to family course of therapy and the modalities   Patient with Progressive multiorgan failure with a very high probablity of a very minimal chance of meaningful recovery despite all aggressive and optimal medical therapy.   HIGH RISK FOR INTUBATION PATIENT IS SUFFERING AND STRUGGLING TO BREATHE NEEDS VENT  PLAN FOR INTUBATION AND NO CPR AFTERWARDS  DAUGHTER FROM [place] COMING IN 2 DAYS  Family understands the situation.  They have consented and agreed to DNR status after intubation Prognosis is grave, she would NOT want to live on machines and would NOT want to live in a nursing home   Family are satisfied with Plan of action and management. All questions answered  Additional CC time 45 mins   Cassadi Purdie Patricia Pesa, M.D.  [facility]  Medical Director [place] Director [facility] Cardio-Pulmonary Department

## 2022-06-13 NOTE — Procedures (Signed)
Central Venous Catheter Insertion Procedure Note  Samantha Mason  [id number]  Oct 16, 1950  Date:06/13/22  Time:5:50 PM   Provider Performing:Keir Viernes Micheline Chapman   Procedure: Insertion of Non-tunneled Central Venous [id number]) with US guidance (48270)   Indication(s) Medication administration and Difficult access  Consent Risks of the procedure as well as the alternatives and risks of each were explained to the patient and/or caregiver.  Consent for the procedure was obtained and is signed in the bedside chart  Anesthesia Topical only with 1% lidocaine   Timeout Verified patient identification, verified procedure, site/side was marked, verified correct patient position, special equipment/implants available, medications/allergies/relevant history reviewed, required imaging and test results available.  Sterile Technique Maximal sterile technique including full sterile barrier drape, hand hygiene, sterile gown, sterile gloves, mask, hair covering, sterile ultrasound probe cover (if used).  Procedure Description Area of catheter insertion was cleaned with chlorhexidine and draped in sterile fashion.  With real-time ultrasound guidance a central venous catheter was placed into the left internal jugular vein. Nonpulsatile blood flow and easy flushing noted in all ports.  The catheter was sutured in place and sterile dressing applied.  Complications/Tolerance None; patient tolerated the procedure well. Chest X-ray is ordered to verify placement for internal jugular or subclavian cannulation.   Chest x-ray is not ordered for femoral cannulation.  EBL Minimal  Specimen(s) None  Donell Beers, AGNP  Pulmonary/Critical Care Pager [phone] (please enter 7 digits) PCCM Consult Pager [phone] (please enter 7 digits)

## 2022-06-13 NOTE — Progress Notes (Addendum)
SLP Cancellation Note  Patient Details Name: Samantha Mason MRN: [id number] DOB: 1950/12/27   Cancelled treatment:       Reason Eval/Treat Not Completed: Medical issues which prohibited therapy.   SLP consult received and appreciated. Chart review completed. Pt currently on Bipap and, per RN, pt's LOA is not supportive of safe oral intake.  Will continue efforts as appropriate.  RN aware and in agreement with POC.    Randall Hiss Graduate Clinician [place], Speech Pathology   Randall Hiss 06/13/2022, 9:44 AM

## 2022-06-14 ENCOUNTER — Inpatient Hospital Stay: Payer: Medicare Other

## 2022-06-14 DIAGNOSIS — Z7189 Other specified counseling: Secondary | ICD-10-CM

## 2022-06-14 DIAGNOSIS — R4182 Altered mental status, unspecified: Secondary | ICD-10-CM | POA: Diagnosis not present

## 2022-06-14 DIAGNOSIS — A419 Sepsis, unspecified organism: Secondary | ICD-10-CM | POA: Diagnosis not present

## 2022-06-14 DIAGNOSIS — R6520 Severe sepsis without septic shock: Secondary | ICD-10-CM | POA: Diagnosis not present

## 2022-06-14 LAB — CBC WITH DIFFERENTIAL/PLATELET
Abs Immature Granulocytes: 0.23 10*3/uL — ABNORMAL HIGH (ref 0.00–0.07)
Basophils Absolute: 0 10*3/uL (ref 0.0–0.1)
Basophils Relative: 0 %
Eosinophils Absolute: 0 10*3/uL (ref 0.0–0.5)
Eosinophils Relative: 0 %
HCT: 30.7 % — ABNORMAL LOW (ref 36.0–46.0)
Hemoglobin: 8.9 g/dL — ABNORMAL LOW (ref 12.0–15.0)
Immature Granulocytes: 2 %
Lymphocytes Relative: 7 %
Lymphs Abs: 1.1 10*3/uL (ref 0.7–4.0)
MCH: 23.7 pg — ABNORMAL LOW (ref 26.0–34.0)
MCHC: 29 g/dL — ABNORMAL LOW (ref 30.0–36.0)
MCV: 81.9 fL (ref 80.0–100.0)
Monocytes Absolute: 1 10*3/uL (ref 0.1–1.0)
Monocytes Relative: 7 %
Neutro Abs: 13.4 10*3/uL — ABNORMAL HIGH (ref 1.7–7.7)
Neutrophils Relative %: 84 %
Platelets: 310 10*3/uL (ref 150–400)
RBC: 3.75 MIL/uL — ABNORMAL LOW (ref 3.87–5.11)
RDW: 17.8 % — ABNORMAL HIGH (ref 11.5–15.5)
WBC: 15.8 10*3/uL — ABNORMAL HIGH (ref 4.0–10.5)
nRBC: 0.3 % — ABNORMAL HIGH (ref 0.0–0.2)

## 2022-06-14 LAB — BLOOD GAS, ARTERIAL
Acid-Base Excess: 2.4 mmol/L — ABNORMAL HIGH (ref 0.0–2.0)
Acid-Base Excess: 4.4 mmol/L — ABNORMAL HIGH (ref 0.0–2.0)
Acid-Base Excess: 7.5 mmol/L — ABNORMAL HIGH (ref 0.0–2.0)
Bicarbonate: 30.2 mmol/L — ABNORMAL HIGH (ref 20.0–28.0)
Bicarbonate: 30.8 mmol/L — ABNORMAL HIGH (ref 20.0–28.0)
Bicarbonate: 32.7 mmol/L — ABNORMAL HIGH (ref 20.0–28.0)
FIO2: 50 %
FIO2: 50 %
MECHVT: 450 mL
Mechanical Rate: 16
Mechanical Rate: 20
O2 Saturation: 96.9 %
O2 Saturation: 97.9 %
O2 Saturation: 99.5 %
PEEP: 5 cmH2O
PEEP: 8 cmH2O
Patient temperature: 37
Patient temperature: 37
Patient temperature: 37
Pressure control: 25 cmH2O
pCO2 arterial: 60 mmHg — ABNORMAL HIGH (ref 32–48)
pCO2 arterial: 52 mmHg — ABNORMAL HIGH (ref 32–48)
pCO2 arterial: 47 mmHg (ref 32–48)
pH, Arterial: 7.31 — ABNORMAL LOW (ref 7.35–7.45)
pH, Arterial: 7.38 (ref 7.35–7.45)
pH, Arterial: 7.45 (ref 7.35–7.45)
pO2, Arterial: 84 mmHg (ref 83–108)
pO2, Arterial: 88 mmHg (ref 83–108)
pO2, Arterial: 198 mmHg — ABNORMAL HIGH (ref 83–108)

## 2022-06-14 LAB — CBC
HCT: 30.3 % — ABNORMAL LOW (ref 36.0–46.0)
Hemoglobin: 9 g/dL — ABNORMAL LOW (ref 12.0–15.0)
MCH: 24.3 pg — ABNORMAL LOW (ref 26.0–34.0)
MCHC: 29.7 g/dL — ABNORMAL LOW (ref 30.0–36.0)
MCV: 81.9 fL (ref 80.0–100.0)
Platelets: 319 10*3/uL (ref 150–400)
RBC: 3.7 MIL/uL — ABNORMAL LOW (ref 3.87–5.11)
RDW: 17.6 % — ABNORMAL HIGH (ref 11.5–15.5)
WBC: 16.1 10*3/uL — ABNORMAL HIGH (ref 4.0–10.5)
nRBC: 0.3 % — ABNORMAL HIGH (ref 0.0–0.2)

## 2022-06-14 LAB — RENAL FUNCTION PANEL
Albumin: 2.5 g/dL — ABNORMAL LOW (ref 3.5–5.0)
Anion gap: 12 (ref 5–15)
BUN: 70 mg/dL — ABNORMAL HIGH (ref 8–23)
CO2: 26 mmol/L (ref 22–32)
Calcium: 8.9 mg/dL (ref 8.9–10.3)
Chloride: 101 mmol/L (ref 98–111)
Creatinine, Ser: 2.06 mg/dL — ABNORMAL HIGH (ref 0.44–1.00)
GFR, Estimated: 25 mL/min — ABNORMAL LOW (ref >60)
Glucose, Bld: 144 mg/dL — ABNORMAL HIGH (ref 70–99)
Phosphorus: 3.6 mg/dL (ref 2.5–4.6)
Potassium: 3.5 mmol/L (ref 3.5–5.1)
Sodium: 139 mmol/L (ref 135–145)

## 2022-06-14 LAB — PROCALCITONIN: Procalcitonin: 0.28 ng/mL

## 2022-06-14 LAB — GLUCOSE, CAPILLARY
Glucose-Capillary: 114 mg/dL — ABNORMAL HIGH (ref 70–99)
Glucose-Capillary: 116 mg/dL — ABNORMAL HIGH (ref 70–99)
Glucose-Capillary: 137 mg/dL — ABNORMAL HIGH (ref 70–99)
Glucose-Capillary: 146 mg/dL — ABNORMAL HIGH (ref 70–99)

## 2022-06-14 LAB — TRIGLYCERIDES: Triglycerides: 388 mg/dL — ABNORMAL HIGH (ref <150)

## 2022-06-14 LAB — MAGNESIUM: Magnesium: 2.1 mg/dL (ref 1.7–2.4)

## 2022-06-14 MED ORDER — PROPOFOL 1000 MG/100ML IV EMUL
25.0000 ug/kg/min | INTRAVENOUS | Status: DC
  Administered 2022-06-14: 50 ug/kg/min via INTRAVENOUS
  Administered 2022-06-14: 25 ug/kg/min via INTRAVENOUS
  Administered 2022-06-15: 40 ug/kg/min via INTRAVENOUS
  Administered 2022-06-15: 10 ug/kg/min via INTRAVENOUS
  Administered 2022-06-15: 40 ug/kg/min via INTRAVENOUS
  Administered 2022-06-15: 15 ug/kg/min via INTRAVENOUS
  Administered 2022-06-15: 35 ug/kg/min via INTRAVENOUS
  Administered 2022-06-16 (×3): 40 ug/kg/min via INTRAVENOUS
  Filled 2022-06-14 (×11): qty 100

## 2022-06-14 MED ORDER — FUROSEMIDE 10 MG/ML IJ SOLN
40.0000 mg | Freq: Once | INTRAMUSCULAR | Status: AC
  Administered 2022-06-14: 40 mg via INTRAVENOUS
  Filled 2022-06-14: qty 4

## 2022-06-14 MED ORDER — FENTANYL CITRATE PF 50 MCG/ML IJ SOSY: 25.0000 ug | PREFILLED_SYRINGE | Freq: Once | INTRAMUSCULAR | Status: DC

## 2022-06-14 MED ORDER — IPRATROPIUM-ALBUTEROL 0.5-2.5 (3) MG/3ML IN SOLN
3.0000 mL | RESPIRATORY_TRACT | Status: DC
  Administered 2022-06-14 – 2022-06-18 (×23): 3 mL via RESPIRATORY_TRACT
  Filled 2022-06-14 (×23): qty 3

## 2022-06-14 MED ORDER — FUROSEMIDE 10 MG/ML IJ SOLN
4.0000 mg/h | INTRAVENOUS | Status: DC
  Administered 2022-06-14 – 2022-06-18 (×3): 4 mg/h via INTRAVENOUS
  Filled 2022-06-14 (×3): qty 20

## 2022-06-14 MED ORDER — ROCURONIUM BROMIDE 10 MG/ML (PF) SYRINGE: 100.0000 mg | PREFILLED_SYRINGE | Freq: Once | INTRAVENOUS | Status: DC

## 2022-06-14 MED ORDER — VECURONIUM BROMIDE 10 MG IV SOLR
10.0000 mg | Freq: Once | INTRAVENOUS | Status: AC
  Administered 2022-06-14: 10 mg via INTRAVENOUS

## 2022-06-14 MED ORDER — DEXMEDETOMIDINE HCL IN NACL 400 MCG/100ML IV SOLN
0.0000 ug/kg/h | INTRAVENOUS | Status: DC
  Administered 2022-06-14 (×6): 1.2 ug/kg/h via INTRAVENOUS
  Administered 2022-06-16 (×2): 0.2 ug/kg/h via INTRAVENOUS
  Administered 2022-06-17 (×2): 0.1 ug/kg/h via INTRAVENOUS
  Administered 2022-06-18 (×2): 0.2 ug/kg/h via INTRAVENOUS
  Administered 2022-06-19: 0.3 ug/kg/h via INTRAVENOUS
  Filled 2022-06-14 (×11): qty 100

## 2022-06-14 MED ORDER — ARTIFICIAL TEARS OPHTHALMIC OINT
1.0000 | TOPICAL_OINTMENT | Freq: Three times a day (TID) | OPHTHALMIC | Status: DC
  Administered 2022-06-14 – 2022-06-19 (×13): 1 via OPHTHALMIC
  Filled 2022-06-14: qty 1
  Filled 2022-06-14: qty 3.5

## 2022-06-14 MED ORDER — CISATRACURIUM BOLUS VIA INFUSION
0.1000 mg/kg | Freq: Once | INTRAVENOUS | Status: AC
  Administered 2022-06-14: 15.9 mg via INTRAVENOUS
  Filled 2022-06-14: qty 16

## 2022-06-14 MED ORDER — METHYLPREDNISOLONE SODIUM SUCC 40 MG IJ SOLR
40.0000 mg | Freq: Two times a day (BID) | INTRAMUSCULAR | Status: DC
  Administered 2022-06-14 – 2022-06-15 (×2): 40 mg via INTRAVENOUS
  Filled 2022-06-14 (×2): qty 1

## 2022-06-14 MED ORDER — SODIUM CHLORIDE 0.9 % IV SOLN
2.0000 g | Freq: Two times a day (BID) | INTRAVENOUS | Status: AC
  Administered 2022-06-14 – 2022-06-21 (×14): 2 g via INTRAVENOUS
  Filled 2022-06-14: qty 12.5
  Filled 2022-06-14: qty 2
  Filled 2022-06-14 (×2): qty 12.5
  Filled 2022-06-14: qty 2
  Filled 2022-06-14: qty 12.5
  Filled 2022-06-14 (×3): qty 2
  Filled 2022-06-14: qty 12.5
  Filled 2022-06-14: qty 2
  Filled 2022-06-14: qty 12.5
  Filled 2022-06-14: qty 2
  Filled 2022-06-14: qty 12.5

## 2022-06-14 MED ORDER — FENTANYL 2500MCG IN NS 250ML (10MCG/ML) PREMIX INFUSION
25.0000 ug/h | INTRAVENOUS | Status: DC
  Administered 2022-06-14: 175 ug/h via INTRAVENOUS
  Administered 2022-06-15: 150 ug/h via INTRAVENOUS
  Administered 2022-06-17: 75 ug/h via INTRAVENOUS
  Filled 2022-06-14 (×3): qty 250

## 2022-06-14 MED ORDER — NOREPINEPHRINE 16 MG/250ML-% IV SOLN
0.0000 ug/min | INTRAVENOUS | Status: DC
  Administered 2022-06-14: 8 ug/min via INTRAVENOUS
  Administered 2022-06-16: 5 ug/min via INTRAVENOUS
  Filled 2022-06-14 (×2): qty 250

## 2022-06-14 MED ORDER — CISATRACURIUM BOLUS VIA INFUSION
0.1000 mg/kg | Freq: Once | INTRAVENOUS | Status: DC
  Filled 2022-06-14: qty 16

## 2022-06-14 MED ORDER — FENTANYL BOLUS VIA INFUSION
25.0000 ug | INTRAVENOUS | Status: DC | PRN
  Administered 2022-06-15 – 2022-06-16 (×2): 25 ug via INTRAVENOUS

## 2022-06-14 MED ORDER — SODIUM CHLORIDE 0.9 % IV SOLN
0.5000 ug/kg/min | INTRAVENOUS | Status: DC
  Filled 2022-06-14: qty 20

## 2022-06-14 MED ORDER — SODIUM CHLORIDE 0.9 % IV SOLN
0.0000 ug/kg/min | INTRAVENOUS | Status: DC
  Administered 2022-06-14: 0.5 ug/kg/min via INTRAVENOUS
  Administered 2022-06-15: 2 ug/kg/min via INTRAVENOUS
  Filled 2022-06-14 (×3): qty 20

## 2022-06-14 MED FILL — Fluocinonide Emulsified Base Cream 0.05%: CUTANEOUS | Qty: 15 | Status: AC

## 2022-06-14 NOTE — Progress Notes (Signed)
Eeg done 

## 2022-06-14 NOTE — Consult Note (Signed)
PHARMACY CONSULT NOTE - FOLLOW UP  Pharmacy Consult for Electrolyte Monitoring and Replacement   Recent Labs: Potassium (mmol/L)  Date Value  06/14/2022 3.5  05/25/2014 3.6   Magnesium (mg/dL)  Date Value  06/14/2022 2.1   Calcium (mg/dL)  Date Value  06/14/2022 8.9   Calcium, Total (mg/dL)  Date Value  05/25/2014 8.6   Albumin (g/dL)  Date Value  06/14/2022 2.5 (L)  05/25/2014 3.2 (L)   Phosphorus (mg/dL)  Date Value  06/14/2022 3.6   Sodium (mmol/L)  Date Value  06/14/2022 139  02/13/2022 135  05/25/2014 141     Assessment: 72 y.o. female with PMHx significant for HFpEF, COPD, OSA (noncompliant with CPAP), CKD Stage III who is  admitted with Sepsis in setting of lower extremity Cellulitis, AKI, and  Acute on Chronic Hypercapnic Respiratory Failure in setting AECOPD.  Course complicated by CO2 narcosis, failed BiPAP requiring intubation and mechanical ventilation. Pharmacy has been consulted to monitor and replace electrolytes while PCCM care.  Goal of Therapy:  Electrolytes WNL  Plan:  - no replacement currently indicated - Will recheck electrolytes with AM labs  Pearla Dubonnet ,PharmD Clinical Pharmacist 06/14/2022 7:43 AM

## 2022-06-14 NOTE — Procedures (Signed)
History: 72 yo F with encephalopathy  Sedation: none  Technique: This EEG was acquired with electrodes placed according to the International 10-20 electrode system (including Fp1, Fp2, F3, F4, C3, C4, P3, P4, O1, O2, T3, T4, T5, T6, A1, A2, Fz, Cz, Pz). The following electrodes were missing or displaced: none.   Background: The background consists of generalized irregular slow activity, predominantly delta range with some intermixed theta. There was no evolution, rhythmicity, or other concerning features. There was no PDR seen. There was no epileptiform activity seen.   Photic stimulation: Physiologic driving is not performed  EEG Abnormalities: 1) Generalized irregular slow activity 2) Absent PDR  Clinical Interpretation: This normal EEG is consistent with a generalized non-specific cerebral dysfunction(encephalopathy). There was no seizure or seizure predisposition recorded on this study. Please note that lack of epileptiform activity on EEG does not preclude the possibility of epilepsy.   Roland Rack, MD [facility] [phone]  If 7pm- 7am, please page neurology on call as listed in [place].

## 2022-06-14 NOTE — Progress Notes (Signed)
   06/14/22 1400  Spiritual Encounters  Type of Visit Initial  Care provided to: Pt and family  Referral source Nurse (RN/NT/LPN)  Reason for visit Urgent spiritual support  OnCall Visit Yes  Spiritual Framework  Presenting Themes Significant life change  Community/Connection Family  Interventions  Spiritual Care Interventions Made Established relationship of care and support;Compassionate presence;Reflective listening;Meditation;Prayer  Spiritual Care Plan  Spiritual Care Issues Still Outstanding Chaplain will continue to follow   Spoke with family member of the patient advise them that Chaplain is around 24/7.

## 2022-06-14 NOTE — Progress Notes (Signed)
NAME:  Samantha Mason, MRN:  [id number], DOB:  Jul 13, 1950, LOS: 6 ADMISSION DATE:  06/08/2022, CONSULTATION DATE:  06/11/2022 REFERRING MD:  Dr. Manuella Ghazi, CHIEF COMPLAINT:  Altered Mental Status, Hypercapnia  Brief Pt Description / Synopsis:  72 y.o. female with PMHx significant for HFpEF, COPD, OSA (noncompliant with CPAP), CKD Stage III who is  admitted with Sepsis in setting of lower extremity Cellulitis, AKI, and  Acute on Chronic Hypercapnic Respiratory Failure in setting AECOPD.  Course complicated by CO2 narcosis, failed BiPAP requiring intubation and mechanical ventilation.  History of Present Illness:  Samantha Mason is a 72 y.o. female with medical history significant for Morbid obesity, chronic HFpEF,CKD stage III, chronic lower extremity lymphedema on treatment with torsemide, hypertension, asthma, stroke (1995) and obstructive sleep apnea(non compliant with cpap) who presented to [facility] ED on 06/08/22 due to shortness of breath and generalized malaise for appropriately 1 week.  Pt is currently altered and no family currently available, therefore history is obtained from chart review.  Per ED and nursing notes, she reported about a 1 week history of feeling weak and fatigued, chills, bilateral lower extremity swelling and erythema (R>L), paroxsymal nocturnal dyspnea and orthopnea, weight gain with increased oxygen requirements.  She denied abdominal pain, N/V/D, dysuria.  ED Course: Initial Vital Signs: 105F orally, BP 129/53, HR 109, RR 27, SpO2 96% Significant Labs: BUN 39, Creatinine 1.59, Albumin 2.4, lactic acid 1.1, WBC 12.9, Hgb 8.0, Hct 27.7, Hgb A1c 5.8 COVID/FLU/RSV negative UA negative for UTI Imaging Chest X-ray>>IMPRESSION: Mild cardiomegaly with central pulmonary vascular congestion. CT Chest/Abdomen/Pelvis>> IMPRESSION: 1. Skin thickening and soft tissue edema of the partially imaged proximal right thigh. Correlate for cellulitis. 2. Newly enlarged bilateral inguinal  and iliac lymph nodes, likely reactive. 3. Diffuse bilateral bronchial wall thickening and mosaic attenuation of the airspaces, consistent with nonspecific infectious or inflammatory bronchitis. 4. Cardiomegaly.  She met Sepsis criteria, therefore she received IV fluids and antibiotics. Hospitalists were asked to admit for further workup and treatment os sepsis due to cellulitis.  Please see "significant hospital events" section below for full detailed hospital course.  Pertinent  Medical History   Past Medical History:  Diagnosis Date   Acute bronchitis    Anemia    Arthritis    Asthma    B12 deficiency 03/14/2017   Chest pain 07/02/2018   Chest pain    a. 06/2018 MV: EF 66%. No ischemia/infarct; b. 02/2021 PET CT: EF>60%, no ischemia/infarct.   Chronic heart failure with preserved ejection fraction (HFpEF) ([place])    a. 08/2020 Echo: EF 55-60%, GrI DD; b. 06/2021 Echo: EF 55-60%, mild LVH, nl RV fxn, no signif valvular dzs.   COPD (chronic obstructive pulmonary disease) ([place]) 07/24/2018   Family history of blood clots 07/16/2017   Irritable bowel syndrome    Malignant hypertension    Obesity    OSA (obstructive sleep apnea)    uses cpap   Pneumonia 2017   Restless leg syndrome    Stroke [facility]) 1995   mild     Micro Data:  1/20: SARS-CoV-2/influenza/RSV PCR>> negative 1/20: Blood culture x 2>> no growth to date 1/20: Urine>> negative 1/22: MRSA PCR>>negative  1/22: Respiratory viral panel>>negative  1/26: Tracheal aspirate>>   Antimicrobials:   Anti-infectives (From admission, onward)    Start     Dose/Rate Route Frequency Ordered Stop   06/13/22 1000  ceFEPIme (MAXIPIME) 2 g in sodium chloride 0.9 % 100 mL IVPB  Status:  Discontinued  2 g 200 mL/hr over 30 Minutes Intravenous Every 24 hours 06/12/22 1430 06/13/22 0747   06/13/22 0900  cefTRIAXone (ROCEPHIN) 2 g in sodium chloride 0.9 % 100 mL IVPB        2 g 200 mL/hr over 30 Minutes Intravenous Every 24  hours 06/13/22 0747 06/20/22 0859   06/11/22 1145  vancomycin (VANCOREADY) IVPB 2000 mg/400 mL        2,000 mg 200 mL/hr over 120 Minutes Intravenous  Once 06/11/22 1047 06/11/22 1344   06/11/22 1145  ceFEPIme (MAXIPIME) 2 g in sodium chloride 0.9 % 100 mL IVPB  Status:  Discontinued        2 g 200 mL/hr over 30 Minutes Intravenous 2 times daily 06/11/22 1047 06/12/22 1430   06/10/22 2300  vancomycin (VANCOCIN) IVPB 1000 mg/200 mL premix  Status:  Discontinued       See Hyperspace for full Linked Orders Report.   1,000 mg 200 mL/hr over 60 Minutes Intravenous Every 48 hours 06/08/22 2307 06/09/22 0922   06/10/22 2300  vancomycin (VANCOREADY) IVPB 1250 mg/250 mL  Status:  Discontinued       See Hyperspace for full Linked Orders Report.   1,250 mg 166.7 mL/hr over 90 Minutes Intravenous Every 48 hours 06/08/22 2307 06/09/22 0922   06/10/22 1800  cephALEXin (KEFLEX) capsule 500 mg  Status:  Discontinued        500 mg Oral Every 6 hours 06/10/22 1521 06/11/22 1047   06/09/22 2200  vancomycin (VANCOREADY) IVPB 750 mg/150 mL  Status:  Discontinued        750 mg 150 mL/hr over 60 Minutes Intravenous Every 24 hours 06/09/22 0923 06/10/22 1521   06/09/22 1000  ceFEPIme (MAXIPIME) 2 g in sodium chloride 0.9 % 100 mL IVPB  Status:  Discontinued        2 g 200 mL/hr over 30 Minutes Intravenous Every 12 hours 06/09/22 0916 06/10/22 1521   06/08/22 2300  vancomycin (VANCOCIN) IVPB 1000 mg/200 mL premix  Status:  Discontinued        1,000 mg 200 mL/hr over 60 Minutes Intravenous  Once 06/08/22 2257 06/08/22 2307   06/08/22 2215  vancomycin (VANCOCIN) IVPB 1000 mg/200 mL premix       See Hyperspace for full Linked Orders Report.   1,000 mg 200 mL/hr over 60 Minutes Intravenous  Once 06/08/22 2210 06/09/22 0014   06/08/22 2215  vancomycin (VANCOREADY) IVPB 1500 mg/300 mL       See Hyperspace for full Linked Orders Report.   1,500 mg 150 mL/hr over 120 Minutes Intravenous  Once 06/08/22 2210 06/10/22  0807   06/08/22 1915  piperacillin-tazobactam (ZOSYN) IVPB 3.375 g        3.375 g 100 mL/hr over 30 Minutes Intravenous  Once 06/08/22 1909 06/08/22 [place] [facility] Events: Including procedures, antibiotic start and stop dates in addition to other pertinent events   1/20: Presented to ED, admitted by [facility] 1/21: Added cefepime, continue vancomycin, increase dose of Cardizem for better heart rate control 1/22: Had to reduce Cardizem dose back to her home dose as blood pressure dropped 1/23: Transferred to ICU overnight due to CO2 narcosis, placed on BiPAP.  PCCM consulted.  Worsening AKI, holding diuresis.  Agitation requiring Precedex, high risk for Intubation.  Palliative Care consulted for Daniel conversations.  ABX broadened back to Cefepime and Vancomycin 1/24: Pt on Bipap but awake and able to follow commands.  Will attempt  to transition off Bipap.  Palliative Care following along to assist with goals of care conversations  1/25: Pt currently on Bipap and precedex gtt.  Required 2.5 mg iv valium overnight for delirium.  Unable to obtain CT Head due to respiratory status.  Ultimately required intubation. 1/26: Remains critically ill. Issues with vent dyschrony and ventilation, plan to start Nimbex gtt. Creatinine improved with diuresis, continue with diuresis, Nephrology considering Lasix gtt.  Interim History / Subjective:  -Overnight issues with vent dyschrony which required pushes of paralytics ~ continues to have issues requiring multiple adjustments with ventilator settings ~ discussed with Dr. Gwendalyn Ege, will place on Nimbex gtt for today and place A-line for frequent ABG's -Afebrile, on low dose levophed -Creatinine improved yesterday with trial of diuresis, UOP 3.5 L (net + 3.2 L) ~ will diurese again with 40 mg IV Lasix x1 ~ Nephrology is following and considering Lasix gtt -New leukocytosis today to 16.1 from 8.6 ~ Procalcitonin is trending down to 0.28 from 0.87 ~ will  obtain tracheal aspirate and broaden back to Cefepime from Ceftriaxone, continue Vancomycin  Objective   Blood pressure (!) 142/49, pulse 73, temperature 98.6 F (37 C), temperature source Oral, resp. rate 20, height '5\' 4"'$  (1.626 m), weight (!) 159.3 kg, SpO2 96 %.    Vent Mode: PRVC FiO2 (%):  [50 %-100 %] 50 % Set Rate:  [16 bmp-20 bmp] 20 bmp Vt Set:  [450 mL] 450 mL PEEP:  [5 cmH20] 5 cmH20 Plateau Pressure:  [25 cmH20] 25 cmH20   Intake/Output Summary (Last 24 hours) at 06/14/2022 0736 Last data filed at 06/14/2022 0700 Gross per 24 hour  Intake 2167.88 ml  Output 3550 ml  Net -1382.12 ml    Filed Weights   06/09/22 1139 06/14/22 0223  Weight: (!) 158.3 kg (!) 159.3 kg    Examination: General: Acute on chronically ill-appearing obese female, laying in bed, intubated, in no acute distress HENT: Supple, difficult to assess JVD due to body habitus  Lungs: Diminished distant breath sounds throughout, even, non labored, intermittent episodes of vent dyschrony  Cardiovascular: NSR, s1s2, no m/r/g, 2+ radial/1+ distal pulses, no edema  Abdomen: +BS x4, obese, mild distension, mild tenderness present   Extremities: All extremities edematous, bilateral LE lymphedema with unna boots in place Neuro: Sedated, not following commands, withdraws from painful stimulation  GU: Indwelling foley catheter drained yellow urine  Skin: Cellulitis to bilateral lower extremities, yeast excoriation to abdominal fold/groins  Resolved Hospital Problem list     Assessment & Plan:   Acute on Chronic Hypercapnic Respiratory Failure in setting of AECOPD and acute CHF exacerbation vs ARDS PMHx: OSA (noncompliant with CPAP at home), morbid obesity with probable OHS -Full vent support, implement lung protective strategies -Plateau pressures less than 30 cm H20 -Wean FiO2 & PEEP as tolerated to maintain O2 sats 88 to 92% -Follow intermittent Chest X-ray & ABG as needed -Spontaneous Breathing Trials  when respiratory parameters met and mental status permits -Implement VAP Bundle -Bronchodilators & Pulmicort nebs - Steroids discontinued 01/25  - ABX as above - Diuresis as BP and renal function permits - Pulmonary toilet as able  Acute on Chronic HFpEF Hypotension PMHx: HTN Echocardiogram 07/16/21: LVEF 55-60%, indeterminate diastolic parameters, RV systolic function normal -Continuous cardiac monitoring -Maintain MAP >65 -Vasopressors as needed to maintain MAP goal -Diuresis as BP and renal function permits ~ will give 40 mg IV Lasix x1 dose 1/26 ~ Nephrology following, considering Lasix gtt -Repeat Echocardiogram results pending  Sepsis secondary to Cellulitis of Lower extremities (Meets SIRS Criteria: HR >90, RR >20, fever) -Monitor fever curve -Trend WBC's & Procalcitonin -Follow cultures as above -Continue empiric Cefepime and Vancomycin pending cultures & sensitivities  Acute Kidney Injury on CKD III ~ Improving  - Trend BMP  - Replace electrolytes as indicated  - Monitor UOP - Avoid nephrotoxic medication as able  - Nephrology following, appreciate input   Anemia of Chronic Disease - Trend CBC - Monitor for s/sx of bleeding  - Transfuse for hgb <7  Acute Metabolic Encephalopathy in the setting of CO2 Narcosis and multiple metabolic derangements Sedation needs in setting of Mechanical Ventilation CT Head 1/25 negative for acute intracranial abnormality -Maintain a RASS goal of -3 to -4 (higher RASS goal for now given issues with vent dyschrony and ventilation) -Fentanyl , Propofol, and Precedex as neeed to maintain RASS goal -Avoid sedating medications as able -Daily wake up assessment -Obtain EEG     Pt is critically ill with multiorgan failure superimposed on multiple chronic co morbidities and morbid obesity.  High risk for further decompensation, need for intubation, cardiac arrest and death.  Anticipate she will be difficult to liberate from the  ventilator, and may likely need Tracheotomy. She is DNR status, Palliative Care following to assist with goals of care.  Best Practice (right click and "Reselect all SmartList Selections" daily)   Diet/type: NPO, start tube feeds DVT prophylaxis: prophylactic heparin  GI prophylaxis: PPI Lines: Left IJ, and is still needed Foley:  foley catheter, and is still needed Code Status:  DNR Last date of multidisciplinary goals of care discussion [06/14/22]  1/26: Pt's son updated at bedside by both myself and Dr. Gwendalyn Ege.  Labs   CBC: Recent Labs  Lab 06/08/22 1918 06/09/22 0716 06/10/22 0335 06/11/22 8250 06/12/22 0736 06/13/22 0421 06/14/22 0404  WBC 12.9*   < > 10.8* 11.4* 6.1 8.6 16.1*  NEUTROABS 11.3*  --   --   --   --   --   --   HGB 8.0*   < > 8.6* 8.2* 9.2* 9.7* 9.0*  HCT 27.7*   < > 30.1* 29.5* 31.5* 32.6* 30.3*  MCV 83.2   < > 83.8 86.8 81.8 79.9* 81.9  PLT 221   < > PLATELET CLUMPS NOTED ON SMEAR, UNABLE TO ESTIMATE 250 159 247 319   < > = values in this interval not displayed.     Basic Metabolic Panel: Recent Labs  Lab 06/10/22 0335 06/11/22 0337 06/12/22 0736 06/13/22 0421 06/14/22 0404  NA 137 139 139 139 139  K 4.1 4.4 4.8 4.6 3.5  CL 99 99 100 102 101  CO2 '24 29 22 25 26  '$ GLUCOSE 102* 85 155* 157* 144*  BUN 45* 52* 70* 75* 70*  CREATININE 2.10* 2.71* 2.85* 2.49* 2.06*  CALCIUM 8.1* 8.3* 8.2* 8.5* 8.9  MG  --   --  1.7 2.2 2.1  PHOS  --   --  6.6* 5.4* 3.6    GFR: Estimated Creatinine Clearance: 38.7 mL/min (A) (by C-G formula based on SCr of 2.06 mg/dL (H)). Recent Labs  Lab 06/08/22 1918 06/08/22 2109 06/09/22 0716 06/10/22 0335 06/11/22 0337 06/12/22 0736 06/13/22 0421 06/14/22 0404  PROCALCITON  --   --  0.87  --   --   --   --   --   WBC 12.9*  --  11.0*   < > 11.4* 6.1 8.6 16.1*  LATICACIDVEN 1.1 0.9  --   --   --   --   --   --    < > =  values in this interval not displayed.     Liver Function Tests: Recent Labs  Lab  06/08/22 1918 06/09/22 0716 06/12/22 0736 06/13/22 0421 06/14/22 0404  AST 17 17  --   --   --   ALT 8 10  --   --   --   ALKPHOS 54 60  --   --   --   BILITOT 0.6 0.6  --   --   --   PROT 6.6 7.2  --   --   --   ALBUMIN 2.4* 2.5* 2.5* 2.6* 2.5*    No results for input(s): "LIPASE", "AMYLASE" in the last 168 hours. Recent Labs  Lab 06/13/22 0421  AMMONIA 20     ABG    Component Value Date/Time   PHART 7.31 (L) 06/14/2022 0507   PCO2ART 60 (H) 06/14/2022 0507   PO2ART 84 06/14/2022 0507   HCO3 30.2 (H) 06/14/2022 0507   ACIDBASEDEF 1.4 06/12/2022 0736   O2SAT 96.9 06/14/2022 0507     Coagulation Profile: Recent Labs  Lab 06/08/22 1918  INR 1.3*     Cardiac Enzymes: No results for input(s): "CKTOTAL", "CKMB", "CKMBINDEX", "TROPONINI" in the last 168 hours.  HbA1C: Hgb A1c MFr Bld  Date/Time Value Ref Range Status  06/08/2022 07:16 AM 5.8 (H) 4.8 - 5.6 % Final    Comment:    (NOTE) Pre diabetes:          5.7%-6.4%  Diabetes:              >6.4%  Glycemic control for   <7.0% adults with diabetes   07/15/2021 04:30 PM 5.4 4.8 - 5.6 % Final    Comment:    (NOTE) Pre diabetes:          5.7%-6.4%  Diabetes:              >6.4%  Glycemic control for   <7.0% adults with diabetes     CBG: Recent Labs  Lab 06/13/22 1524 06/13/22 1934 06/13/22 2314 06/14/22 0345 06/14/22 0722  GLUCAP 139* 140* 114* 114* 116*     Review of Systems:   Unable to assess due to AMS/intubation/sedation  Past Medical History:  She,  has a past medical history of Acute bronchitis, Anemia, Arthritis, Asthma, B12 deficiency (03/14/2017), Chest pain (07/02/2018), Chest pain, Chronic heart failure with preserved ejection fraction (HFpEF) ([place]), COPD (chronic obstructive pulmonary disease) ([place]) (07/24/2018), Family history of blood clots (07/16/2017), Irritable bowel syndrome, Malignant hypertension, Obesity, OSA (obstructive sleep apnea), Pneumonia (2017), Restless leg  syndrome, and Stroke ([place]) (1995).   Surgical History:   Past Surgical History:  Procedure Laterality Date   CHOLECYSTECTOMY     COLONOSCOPY WITH PROPOFOL N/A 03/31/2018   Procedure: COLONOSCOPY WITH PROPOFOL;  Surgeon: Lucilla Lame, MD;  Location[facility] ENDOSCOPY;  Service: Endoscopy;  Laterality: N/A;   KNEE ARTHROPLASTY Right 04/30/2019   Procedure: COMPUTER ASSISTED TOTAL KNEE ARTHROPLASTY;  Surgeon: Dereck Leep, MD;  Location: [facility] ORS;  Service: Orthopedics;  Laterality: Right;   RIGHT HEART CATH N/A 01/30/2022   Procedure: RIGHT HEART CATH;  Surgeon: Nelva Bush, MD;  Location: [place] CV LAB;  Service: Cardiovascular;  Laterality: N/A;   saliva gland removal  1998   TOTAL KNEE ARTHROPLASTY Left 2014   TUBAL LIGATION Bilateral      Social History:   reports that she has never smoked. She has never used smokeless tobacco. She reports that she does not drink alcohol and does not use  drugs.   Family History:  Her family history includes COPD in her father; Coronary artery disease (age of onset: 74) in her brother; Dementia in her mother; Diabetes in her father; Heart failure in her brother and maternal grandmother; Stroke in her maternal grandmother. There is no history of Breast cancer.   Allergies Allergies  Allergen Reactions   Nuvigil [Armodafinil] Hives   Penicillins Diarrhea and Nausea And Vomiting    Did it involve swelling of the face/tongue/throat, SOB, or low BP? no Did it involve sudden or severe rash/hives, skin peeling, or any reaction on the inside of your mouth or nose? No Did you need to seek medical attention at a hospital or doctor's office? No When did it last happen?  in her 85s    If all above answers are "NO", may proceed with cephalosporin use.    Spironolactone Itching   Gabapentin Other (See Comments)    "wiped her out, couldn't stay awake"   Melatonin Other (See Comments)    "Brain fog"   Provigil [Modafinil] Hives   Entresto  [Sacubitril-Valsartan] Itching   Lisinopril Cough     Home Medications  Prior to Admission medications   Medication Sig Start Date End Date Taking? Authorizing Provider  acetaminophen (TYLENOL) 500 MG tablet Take 1,000 mg by mouth every 6 (six) hours as needed.   Yes [provider]  benzonatate (TESSALON) 100 MG capsule TAKE 1 CAPSULE BY MOUTH THREE TIMES A DAY AS NEEDED FOR COUGH 03/08/22  Yes Tyler Pita, MD  calcitRIOL (ROCALTROL) 0.25 MCG capsule Take 0.25 mcg by mouth daily. 02/22/22  Yes [provider]  dapagliflozin propanediol (FARXIGA) 10 MG TABS tablet Take 1 tablet (10 mg total) by mouth daily before breakfast. 03/07/22  Yes Hackney, Tina A, FNP  diltiazem (CARDIZEM CD) 240 MG 24 hr capsule Take 1 capsule (240 mg total) by mouth daily. 09/05/21  Yes Theora Gianotti, NP  ipratropium-albuterol (DUONEB) 0.5-2.5 (3) MG/3ML SOLN TAKE 3 MLS BY NEBULIZATION EVERY 6 (SIX) HOURS AS NEEDED. J45.40 03/04/22  Yes Tyler Pita, MD  metolazone (ZAROXOLYN) 2.5 MG tablet Take 1 tablet (2.5 mg total) by mouth 2 (two) times a week. Patient taking differently: Take 2.5 mg by mouth 2 (two) times a week. Monday and Friday 02/04/22  Yes Wouk, Ailene Rud, MD  ondansetron (ZOFRAN) 4 MG tablet Take 1 tablet (4 mg total) by mouth daily as needed for nausea or vomiting. 05/10/22 05/10/23 Yes Lorella Nimrod, MD  pantoprazole (PROTONIX) 40 MG tablet TAKE 1 TABLET BY MOUTH EVERY DAY 12/11/21  Yes Glean Hess, MD  PREVALITE 4 g packet DISSOLVE AND DRINK 1 PACKET BY MOUTH AT BEDTIME 03/05/22  Yes Glean Hess, MD  rOPINIRole (REQUIP) 1 MG tablet Take 1 tablet (1 mg total) by mouth 3 (three) times daily. 02/13/22  Yes Glean Hess, MD  Torsemide 40 MG TABS Take 40 mg by mouth daily.   Yes [provider]  budesonide (PULMICORT) 0.5 MG/2ML nebulizer solution TAKE 2 ML (0.5 MG TOTAL) BY NEBULIZATION TWICE A DAY Patient not taking: Reported on 06/08/2022  10/29/21   Tyler Pita, MD  feeding supplement (ENSURE ENLIVE / ENSURE PLUS) LIQD Take 237 mLs by mouth 3 (three) times daily between meals. 05/10/22   Lorella Nimrod, MD  metoCLOPramide (REGLAN) 5 MG tablet Take 1 tablet (5 mg total) by mouth 3 (three) times daily before meals. Patient not taking: Reported on 06/08/2022 05/12/22   Sharen Hones, MD  NON FORMULARY Bipap - nightly with Oxygen 2 Lpm    [provider]  oxyCODONE 7.5 MG TABS Take 7.5 mg by mouth every 6 (six) hours as needed. 05/10/22 05/10/23  Lorella Nimrod, MD  OXYGEN Inhale into the lungs. 2 LPM [place]    [provider]  promethazine (PHENERGAN) 25 MG tablet Take 1 tablet (25 mg total) by mouth every 6 (six) hours as needed. Patient not taking: Reported on 06/08/2022 05/10/22 05/10/23  Lorella Nimrod, MD     Critical care time: 45 minutes    Darel Hong, AGACNP-BC [facility] Prefer epic messenger for cross cover needs If after hours, please call E-link

## 2022-06-14 NOTE — Progress Notes (Signed)
Daily Progress Note   Patient Name: Samantha Mason       Date: 06/14/2022 DOB: [birth year]  Age: 72 y.o. MRN#: [id number] Attending Physician: Bronson Ing, MD Primary Care Physician: Glean Hess, MD Admit Date: 06/08/2022  Reason for Consultation/Follow-up: Establishing goals of care  Subjective: Notes and labs reviewed. Patient resting in bed on ventilator. No family at bedside. EEG in progress. Spoke with CCM, he states goals are currently set for the next few days. Will follow up Monday.   Length of Stay: 6  Current Medications: Scheduled Meds:   budesonide (PULMICORT) nebulizer solution  0.5 mg Nebulization BID   calcitRIOL  0.25 mcg Per Tube Daily   Chlorhexidine Gluconate Cloth  6 each Topical Daily   diltiazem  240 mg Oral Daily   docusate  100 mg Per Tube BID   fentaNYL (SUBLIMAZE) injection  25 mcg Intravenous Once   fluocinonide-emollient   Topical QODAY   free water  30 mL Per Tube Q4H   heparin  5,000 Units Subcutaneous Q8H   ipratropium-albuterol  3 mL Nebulization Q6H   nutrition supplement (JUVEN)  1 packet Per Tube BID BM   mouth rinse  15 mL Mouth Rinse Q2H   pantoprazole (PROTONIX) IV  40 mg Intravenous Q24H   polyethylene glycol  17 g Per Tube Daily   rOPINIRole  1 mg Per Tube TID    Continuous Infusions:  sodium chloride Stopped (06/13/22 1953)   cefTRIAXone (ROCEPHIN)  IV Stopped (06/14/22 0842)   dexmedetomidine (PRECEDEX) IV infusion 1.2 mcg/kg/hr (06/14/22 1000)   feeding supplement (VITAL HIGH PROTEIN) 40 mL/hr at 06/14/22 0925   fentaNYL infusion INTRAVENOUS 200 mcg/hr (06/14/22 0925)   furosemide (LASIX) 200 mg in dextrose 5 % 100 mL (2 mg/mL) infusion     norepinephrine (LEVOPHED) Adult infusion 7 mcg/min (06/14/22 0925)   propofol  (DIPRIVAN) infusion 60 mcg/kg/min (06/14/22 0959)    PRN Meds: benzonatate, fentaNYL, ipratropium-albuterol, midazolam, ondansetron **OR** ondansetron (ZOFRAN) IV, vecuronium  Physical Exam Constitutional:      Comments: Eyes closed.   Pulmonary:     Comments: On ventilator.             Vital Signs: BP (!) 142/54   Pulse 72   Temp (!) 95 F (35 C) (Esophageal)   Resp 20  Ht '5\' 4"'$  (1.626 m)   Wt (!) 159.3 kg   SpO2 97%   BMI 60.28 kg/m  SpO2: SpO2: 97 % O2 Device: O2 Device: Ventilator O2 Flow Rate: O2 Flow Rate (L/min): 4 L/min  Intake/output summary:  Intake/Output Summary (Last 24 hours) at 06/14/2022 1114 Last data filed at 06/14/2022 0901 Gross per 24 hour  Intake 2287.63 ml  Output 2850 ml  Net -562.37 ml   LBM: Last BM Date : 06/08/22 Baseline Weight: Weight: (!) 158.3 kg Most recent weight: Weight: (!) 159.3 kg     Patient Active Problem List   Diagnosis Date Noted   Cellulitis 53/61/4431   Acute metabolic encephalopathy 54/00/8676   Severe sepsis ([place]) 06/08/2022   AMS (altered mental status) 04/30/2022   Small bowel obstruction ([place]) 04/26/2022   Hypokalemia 01/26/2022   SOB (shortness of breath)    Extreme obesity with alveolar hypoventilation (HCC)    CKD (chronic kidney disease) stage 4, GFR 15-29 ml/min ([place]) 01/23/2022   Chronic respiratory failure with hypoxia and hypercapnia ([place]) 10/15/2021   Acute on chronic heart failure with preserved ejection fraction (HFpEF) ([place]) 08/27/2021   Hyponatremia 07/26/2021   Insomnia 07/22/2021   Acute renal failure superimposed on stage 3b chronic kidney disease ([place]) 07/17/2021   Acute on chronic respiratory failure with hypoxia and hypercapnia ( [place]) 07/15/2021   Chronic asthmatic bronchitis 07/15/2021   Morbid obesity ([place]) 07/15/2021   Myocardial injury 07/15/2021   Chronic diastolic CHF (congestive heart failure) ([place])    History of stroke 11/17/2020   Ovarian failure 02/21/2020   Primary  osteoarthritis of right knee 02/13/2019   S/P total knee arthroplasty, left 02/13/2019   Aortic atherosclerosis ([place]) 08/01/2018   Obstructive lung disease (generalized) ([place]) 07/24/2018   B12 deficiency anemia 07/07/2018   Benign neoplasm of ascending colon    Severe persistent asthma 02/13/2018   Vitamin D deficiency 03/17/2017   Prediabetes 03/14/2017   Microcytic anemia 03/14/2017   Irritable bowel syndrome with diarrhea 03/12/2017   Allergic rhinitis 03/12/2017   Restless leg syndrome 03/12/2017   GERD (gastroesophageal reflux disease) 10/16/2016   Essential hypertension 10/16/2016   Morbid obesity with BMI of 50.0-59.9, adult ([place]) 12/08/2014   OSA  12/08/2014   Shortness of breath 04/07/2014    Palliative Care Assessment & Plan     Recommendations/Plan: PMT will follow up Monday  Code Status:    Code Status Orders  (From admission, onward)           Start     Ordered   06/13/22 1324  Do not attempt resuscitation (DNR)  Continuous       Question Answer Comment  If patient has no pulse and is not breathing Do Not Attempt Resuscitation   If patient has a pulse and/or is breathing: Medical Treatment Goals MEDICAL INTERVENTIONS DESIRED: Use advanced airway interventions, mechanical ventilation or cardioversion in appropriate circumstances; Use medication/IV fluids as indicated; Provide comfort medications; Transfer to Progressive/Stepdown/ICU as indicated.   Consent: Discussion documented in EHR or advanced directives reviewed      06/13/22 1323           Code Status History     Date Active Date Inactive Code Status Order ID Comments User Context   06/08/2022 2257 06/13/2022 1323 Full Code [id number]  Artist Beach, MD ED   04/26/2022 2000 05/14/2022 2105 Full Code [id number]  Athena Masse, MD ED   01/23/2022 2031 02/01/2022 1759 Full Code [id number]  Collier Bullock, MD ED  07/15/2021 0947 07/27/2021 2149 Full Code [id number]  Ivor Costa, MD ED   07/15/2021  0636 07/15/2021 0947 Full Code [id number]  Rhetta Mura, DO ED   03/06/2021 0314 03/10/2021 1821 Full Code [id number]  Athena Masse, MD ED   09/19/2020 2232 09/22/2020 2339 Full Code [id number]  Toy Baker, MD ED   08/29/2020 1914 09/02/2020 1814 Full Code [id number]  Marcelyn Bruins, MD ED   04/30/2019 1426 05/02/2019 2057 Full Code [id number]  Dereck Leep, MD Inpatient   07/24/2018 1522 07/26/2018 1818 Full Code [id number]  Salary, Avel Peace, MD Inpatient   07/02/2018 0320 07/02/2018 1855 Full Code [id number]  Salary, Avel Peace, MD Inpatient      Advance Directive Documentation    Flowsheet Row Most Recent Value  Type of Advance Directive Healthcare Power of Attorney  Pre-existing out of facility DNR order (yellow form or pink MOST form) --  "MOST" Form in Place? --          Care plan was discussed with CCM  Thank you for allowing the Palliative Medicine Team to assist in the care of this patient.    Asencion Gowda, NP  Please contact Palliative Medicine Team phone at [phone] for questions and concerns.

## 2022-06-14 NOTE — Procedures (Signed)
Arterial Catheter Insertion Procedure Note  Samantha Mason  [id number]  [birth year]  Date:06/14/22  Time:1:03 PM    Provider Performing: Bradly Bienenstock    Procedure: Insertion of Arterial Line [phone]) with US guidance (83419)   Indication(s) Blood pressure monitoring and/or need for frequent ABGs  Consent Risks of the procedure as well as the alternatives and risks of each were explained to the patient and/or caregiver.  Consent for the procedure was obtained and is signed in the bedside chart  Anesthesia None   Time Out Verified patient identification, verified procedure, site/side was marked, verified correct patient position, special equipment/implants available, medications/allergies/relevant history reviewed, required imaging and test results available.   Sterile Technique Maximal sterile technique including full sterile barrier drape, hand hygiene, sterile gown, sterile gloves, mask, hair covering, sterile ultrasound probe cover (if used).   Procedure Description Area of catheter insertion was cleaned with chlorhexidine and draped in sterile fashion. With real-time ultrasound guidance an arterial catheter was placed into the left radial artery.  Appropriate arterial tracings confirmed on monitor.     Complications/Tolerance None; patient tolerated the procedure well.   EBL Minimal   Darel Hong, AGACNP-BC [facility] Prefer epic messenger for cross cover needs If after hours, please call E-link

## 2022-06-14 NOTE — TOC Progression Note (Signed)
Transition of Care [facility]) - Progression Note    Patient Details  Name: Samantha Mason MRN: [id number] Date of Birth: [birth year]  [facility] CM/SW Contact  Shelbie Hutching, RN Phone Number: 06/14/2022, 12:15 PM  Clinical Narrative:    Patient intubated in the ICU yesterday.  Son has been at the bedside continuously.  Currently patient intubated and sedated.  Palliative care is following.    Expected Discharge Plan:  (TBD) Barriers to Discharge: Continued Medical Work up  Expected Discharge Plan and Services   Discharge Planning Services: CM Consult   Living arrangements for the past 2 months: Single Family Home                                       Social Determinants of Health (SDOH) Interventions SDOH Screenings   Food Insecurity: No Food Insecurity (06/09/2022)  Housing: Low Risk  (06/09/2022)  Transportation Needs: No Transportation Needs (06/09/2022)  Utilities: Not At Risk (06/09/2022)  Alcohol Screen: Low Risk  (08/08/2021)  Depression (PHQ2-9): Medium Risk (02/25/2022)  Financial Resource Strain: Medium Risk (08/14/2021)  Physical Activity: Inactive (08/08/2021)  Social Connections: Socially Isolated (08/08/2021)  Stress: No Stress Concern Present (08/08/2021)  Tobacco Use: Low Risk  (06/11/2022)    Readmission Risk Interventions     No data to display

## 2022-06-14 NOTE — Progress Notes (Addendum)
[facility]  ROUNDING NOTE   Subjective:   Samantha Mason  is a 72 year old female with past medical history including COPD with renal failure requiring 2 L oxygen as needed, chronic diastolic heart failure, sleep apnea, morbid obesity, hypertension, and chronic kidney disease stage IV. Patient presents to the emergency department feeling sick for a few days.  She was admitted for Sepsis [facility]) [A41.9] Cellulitis, unspecified cellulitis site [L03.90] Community acquired pneumonia, unspecified laterality [J18.9] Sepsis, due to unspecified organism, unspecified whether acute organ dysfunction present [facility]) [A41.9]  Patient is followed by our office outpatient. She was last seen in office on Apr 17, 2022.   Patient seen and evaluated at bedside in ICU Patient is now intubated on vent at 50% FiO2 Sedation with fentanyl, Versed, and propofol Pressors: Levo Tube feed at 40 mL/h Lower extremity edema improved from yesterday but remains Ace bandages on bilateral lower extremities  Foley catheter, 3.5 L recorded in preceding 24 hours   Objective:  Vital signs in last 24 hours:  Temp:  [95 F (35 C)-99 F (37.2 C)] 95 F (35 C) (01/26 0800) Pulse Rate:  [58-112] 72 (01/26 0900) Resp:  [14-41] 20 (01/26 0900) BP: (67-163)/(27-98) 142/54 (01/26 0900) SpO2:  [73 %-100 %] 95 % (01/26 0900) FiO2 (%):  [50 %-100 %] 50 % (01/26 0807) Weight:  [159.3 kg] 159.3 kg (01/26 0223)  Weight change:  Filed Weights   06/09/22 1139 06/14/22 0223  Weight: (!) 158.3 kg (!) 159.3 kg    Intake/Output: I/O last 3 completed shifts: In: 3308.9 [I.V.:2646.1; NG/GT:562.8; IV Piggyback:100] Out: 5638 [Urine:3850]   Intake/Output this shift:  Total I/O In: 390.6 [I.V.:209.9; NG/GT:80.7; IV Piggyback:100] Out: 250 [Urine:250]  Physical Exam: General: Ill-appearing  Head: Normocephalic, atraumatic.   Eyes: Anicteric  Lungs:  Intubated on vent  Heart: Regular rate and rhythm  Abdomen:   Soft, nontender  Extremities:  2+ peripheral edema.  Neurologic: Sedated  Skin: No lesions  Access: None    Basic Metabolic Panel: Recent Labs  Lab 06/10/22 0335 06/11/22 0337 06/12/22 0736 06/13/22 0421 06/14/22 0404  NA 137 139 139 139 139  K 4.1 4.4 4.8 4.6 3.5  CL 99 99 100 102 101  CO2 '24 29 22 25 26  '$ GLUCOSE 102* 85 155* 157* 144*  BUN 45* 52* 70* 75* 70*  CREATININE 2.10* 2.71* 2.85* 2.49* 2.06*  CALCIUM 8.1* 8.3* 8.2* 8.5* 8.9  MG  --   --  1.7 2.2 2.1  PHOS  --   --  6.6* 5.4* 3.6     Liver Function Tests: Recent Labs  Lab 06/08/22 1918 06/09/22 0716 06/12/22 0736 06/13/22 0421 06/14/22 0404  AST 17 17  --   --   --   ALT 8 10  --   --   --   ALKPHOS 54 60  --   --   --   BILITOT 0.6 0.6  --   --   --   PROT 6.6 7.2  --   --   --   ALBUMIN 2.4* 2.5* 2.5* 2.6* 2.5*    No results for input(s): "LIPASE", "AMYLASE" in the last 168 hours. Recent Labs  Lab 06/13/22 0421  AMMONIA 20     CBC: Recent Labs  Lab 06/08/22 1918 06/09/22 0716 06/10/22 0335 06/11/22 0337 06/12/22 0736 06/13/22 0421 06/14/22 0404  WBC 12.9*   < > 10.8* 11.4* 6.1 8.6 15.8*  16.1*  NEUTROABS 11.3*  --   --   --   --   --  13.4*  HGB 8.0*   < > 8.6* 8.2* 9.2* 9.7* 8.9*  9.0*  HCT 27.7*   < > 30.1* 29.5* 31.5* 32.6* 30.7*  30.3*  MCV 83.2   < > 83.8 86.8 81.8 79.9* 81.9  81.9  PLT 221   < > PLATELET CLUMPS NOTED ON SMEAR, UNABLE TO ESTIMATE 250 159 247 310  319   < > = values in this interval not displayed.     Cardiac Enzymes: No results for input(s): "CKTOTAL", "CKMB", "CKMBINDEX", "TROPONINI" in the last 168 hours.  BNP: Invalid input(s): "POCBNP"  CBG: Recent Labs  Lab 06/13/22 1524 06/13/22 1934 06/13/22 2314 06/14/22 0345 06/14/22 0722  GLUCAP 139* 140* 114* 114* 116*     Microbiology: Results for orders placed or performed during the hospital encounter of 06/08/22  Blood Culture (routine x 2)     Status: None   Collection Time: 06/08/22   7:18 PM   Specimen: BLOOD  Result Value Ref Range Status   Specimen Description BLOOD BLOOD LEFT ARM  Final   Special Requests   Final    BOTTLES DRAWN AEROBIC AND ANAEROBIC Blood Culture adequate volume   Culture   Final    NO GROWTH 5 DAYS Performed at [facility], [address], [place], [place] [address]    Report Status 06/13/2022 FINAL  Final  Blood Culture (routine x 2)     Status: None   Collection Time: 06/08/22  7:18 PM   Specimen: BLOOD  Result Value Ref Range Status   Specimen Description BLOOD BLOOD RIGHT ARM  Final   Special Requests   Final    BOTTLES DRAWN AEROBIC AND ANAEROBIC Blood Culture adequate volume   Culture   Final    NO GROWTH 5 DAYS Performed at [facility], [address] [place], [place] [address]    Report Status 06/13/2022 FINAL  Final  Resp panel by RT-PCR (RSV, Flu A&B, Covid) Anterior Nasal Swab     Status: None   Collection Time: 06/08/22  7:18 PM   Specimen: Anterior Nasal Swab  Result Value Ref Range Status   SARS Coronavirus 2 by RT PCR NEGATIVE NEGATIVE Final    Comment: (NOTE) SARS-CoV-2 target nucleic acids are NOT DETECTED.  The SARS-CoV-2 RNA is generally detectable in upper respiratory specimens during the acute phase of infection. The lowest concentration of SARS-CoV-2 viral copies this assay can detect is 138 copies/mL. A negative result does not preclude SARS-Cov-2 infection and should not be used as the sole basis for treatment or other patient management decisions. A negative result may occur with  improper specimen collection/handling, submission of specimen other than nasopharyngeal swab, presence of viral mutation(s) within the areas targeted by this assay, and inadequate number of viral copies(<138 copies/mL). A negative result must be combined with clinical observations, patient history, and epidemiological information. The expected result is Negative.  Fact Sheet for Patients:   EntrepreneurPulse.com.au  Fact Sheet for Healthcare Providers:  IncredibleEmployment.be  This test is no t yet approved or cleared by the [place] FDA and  has been authorized for detection and/or diagnosis of SARS-CoV-2 by FDA under an Emergency Use Authorization (EUA). This EUA will remain  in effect (meaning this test can be used) for the duration of the COVID-19 declaration under Section 564(b)(1) of the Act, 21 U.S.C.section 360bbb-3(b)(1), unless the authorization is terminated  or revoked sooner.       Influenza A by PCR NEGATIVE NEGATIVE Final   Influenza  B by PCR NEGATIVE NEGATIVE Final    Comment: (NOTE) The Xpert Xpress SARS-CoV-2/FLU/RSV plus assay is intended as an aid in the diagnosis of influenza from Nasopharyngeal swab specimens and should not be used as a sole basis for treatment. Nasal washings and aspirates are unacceptable for Xpert Xpress SARS-CoV-2/FLU/RSV testing.  Fact Sheet for Patients: EntrepreneurPulse.com.au  Fact Sheet for Healthcare Providers: IncredibleEmployment.be  This test is not yet approved or cleared by the [place] FDA and has been authorized for detection and/or diagnosis of SARS-CoV-2 by FDA under an Emergency Use Authorization (EUA). This EUA will remain in effect (meaning this test can be used) for the duration of the COVID-19 declaration under Section 564(b)(1) of the Act, 21 U.S.C. section 360bbb-3(b)(1), unless the authorization is terminated or revoked.     Resp Syncytial Virus by PCR NEGATIVE NEGATIVE Final    Comment: (NOTE) Fact Sheet for Patients: EntrepreneurPulse.com.au  Fact Sheet for Healthcare Providers: IncredibleEmployment.be  This test is not yet approved or cleared by the [place] FDA and has been authorized for detection and/or diagnosis of SARS-CoV-2 by FDA under an Emergency Use  Authorization (EUA). This EUA will remain in effect (meaning this test can be used) for the duration of the COVID-19 declaration under Section 564(b)(1) of the Act, 21 U.S.C. section 360bbb-3(b)(1), unless the authorization is terminated or revoked.  Performed at [facility], [address] [place], [place] [address]   Urine Culture     Status: None   Collection Time: 06/08/22  9:34 PM   Specimen: In/Out Cath Urine  Result Value Ref Range Status   Specimen Description   Final    IN/OUT CATH URINE Performed at [facility], [address], [place], [place] [address]    Special Requests   Final    NONE Performed at [facility], [address] [place], [place] [address]    Culture   Final    NO GROWTH Performed at [facility], [place] [address] [place], [place] [address]    Report Status 06/10/2022 FINAL  Final  MRSA Next Gen by PCR, Nasal     Status: None   Collection Time: 06/10/22 11:49 PM   Specimen: Nasal Mucosa; Nasal Swab  Result Value Ref Range Status   MRSA by PCR Next Gen NOT DETECTED NOT DETECTED Final    Comment: (NOTE) The GeneXpert MRSA Assay (FDA approved for NASAL specimens only), is one component of a comprehensive MRSA colonization surveillance program. It is not intended to diagnose MRSA infection nor to guide or monitor treatment for MRSA infections. Test performance is not FDA approved in patients less than 31 years old. Performed at [facility], [place], [place] [address]   Respiratory (~20 pathogens) panel by PCR     Status: None   Collection Time: 06/12/22  6:15 AM   Specimen: Nasopharyngeal Swab; Respiratory  Result Value Ref Range Status   Adenovirus NOT DETECTED NOT DETECTED Final   Coronavirus 229E NOT DETECTED NOT DETECTED Final    Comment: (NOTE) The Coronavirus on the Respiratory Panel, DOES NOT test for the novel  Coronavirus (2019 nCoV)    Coronavirus HKU1 NOT DETECTED NOT  DETECTED Final   Coronavirus NL63 NOT DETECTED NOT DETECTED Final   Coronavirus OC43 NOT DETECTED NOT DETECTED Final   Metapneumovirus NOT DETECTED NOT DETECTED Final   Rhinovirus / Enterovirus NOT DETECTED NOT DETECTED Final   Influenza A NOT DETECTED NOT DETECTED Final  Influenza B NOT DETECTED NOT DETECTED Final   Parainfluenza Virus 1 NOT DETECTED NOT DETECTED Final   Parainfluenza Virus 2 NOT DETECTED NOT DETECTED Final   Parainfluenza Virus 3 NOT DETECTED NOT DETECTED Final   Parainfluenza Virus 4 NOT DETECTED NOT DETECTED Final   Respiratory Syncytial Virus NOT DETECTED NOT DETECTED Final   Bordetella pertussis NOT DETECTED NOT DETECTED Final   Bordetella Parapertussis NOT DETECTED NOT DETECTED Final   Chlamydophila pneumoniae NOT DETECTED NOT DETECTED Final   Mycoplasma pneumoniae NOT DETECTED NOT DETECTED Final    Comment: Performed at [facility], [place] [address] [place],  [address]    Coagulation Studies: No results for input(s): "LABPROT", "INR" in the last 72 hours.  Urinalysis: No results for input(s): "COLORURINE", "LABSPEC", "PHURINE", "GLUCOSEU", "HGBUR", "BILIRUBINUR", "KETONESUR", "PROTEINUR", "UROBILINOGEN", "NITRITE", "LEUKOCYTESUR" in the last 72 hours.  Invalid input(s): "APPERANCEUR"    Imaging: DG Chest 1 View  Result Date: 06/13/2022 CLINICAL DATA:  [id number] Endotracheally intubated [id number] EXAM: CHEST  1 VIEW COMPARISON:  Chest x-ray 06/13/2022 6:18 p.m. FINDINGS: Endotracheal tube with tip 2 cm above the carina. Left internal jugular central venous catheter with tip overlying the expected region of the superior cavoatrial junction. Enteric tube coursing below the hemidiaphragm with tip and side port collimated off view. The heart and mediastinal contours are unchanged. Aortic calcification. Low lung volumes. Diffuse patchy airspace opacities. No pulmonary edema. At least bilateral trace pleural effusions.  No pneumothorax. No acute osseous  abnormality. IMPRESSION: 1. Lines and tubes as above. 2. Low lung volumes with diffuse patchy airspace opacities. Followup PA and lateral chest X-ray is recommended in 3-4 weeks following therapy to ensure resolution and exclude underlying malignancy. 3. At least bilateral trace pleural effusions. Electronically Signed   By: Iven Finn M.D.   On: 06/13/2022 23:37   DG Chest 1 View  Result Date: 06/13/2022 CLINICAL DATA:  Central line EXAM: CHEST  1 VIEW COMPARISON:  Chest x-ray 06/13/2022 FINDINGS: There is an endotracheal tube with distal tip 3.1 cm above the carina. Enteric tube extends into the stomach. There is a new left-sided central venous catheter with distal tip projecting over the distal SVC. The heart is enlarged, unchanged. Bilateral multifocal mid and lower lung airspace opacities are similar to prior. There is a stable small left pleural effusion. There is no pneumothorax or acute fracture. IMPRESSION: 1. New left-sided central venous catheter with distal tip projecting over the distal SVC. No pneumothorax. 2. Stable bilateral multifocal airspace opacities and small left pleural effusion. 3. Stable cardiomegaly. Electronically Signed   By: Ronney Asters M.D.   On: 06/13/2022 18:34   CT HEAD WO CONTRAST (5MM)  Result Date: 06/13/2022 CLINICAL DATA:  Altered mental status EXAM: CT HEAD WITHOUT CONTRAST TECHNIQUE: Contiguous axial images were obtained from the base of the skull through the vertex without intravenous contrast. RADIATION DOSE REDUCTION: This exam was performed according to the departmental dose-optimization program which includes automated exposure control, adjustment of the mA and/or kV according to patient size and/or use of iterative reconstruction technique. COMPARISON:  CT Head 04/30/22 FINDINGS: Brain: No evidence of acute infarction, hemorrhage, hydrocephalus, extra-axial collection or mass lesion/mass effect. Vascular: No hyperdense vessel or unexpected calcification.  Skull: Normal. Negative for fracture or focal lesion. Sinuses/Orbits: No acute finding. Other: None IMPRESSION: No acute intracranial abnormality. Electronically Signed   By: Marin Roberts M.D.   On: 06/13/2022 17:29   DG Chest Port 1 View  Result Date: 06/13/2022 CLINICAL DATA:  Endotracheal tube. EXAM: PORTABLE CHEST 1 VIEW COMPARISON:  Same day. FINDINGS: Endotracheal and nasogastric tubes are in grossly good position. Stable bilateral lung opacities. IMPRESSION: Endotracheal and nasogastric tubes are in grossly good position. Electronically Signed   By: Marijo Conception M.D.   On: 06/13/2022 13:51   DG Chest Port 1 View  Result Date: 06/13/2022 CLINICAL DATA:  Respiratory failure, hypoxia EXAM: PORTABLE CHEST 1 VIEW COMPARISON:  06/08/2022 FINDINGS: The heart size and mediastinal worsening diffuse interstitial and alveolar opacities consistent with pulmonary edema or bilateral pneumonia. Elevated right hemidiaphragm. Possible small pleural effusions. No pneumothorax. Calcified aorta. Thoracic degenerative changes. IMPRESSION: Worsening diffuse interstitial and alveolar process most likely pulmonary edema versus extensive bilateral pneumonia. Electronically Signed   By: Sammie Bench M.D.   On: 06/13/2022 08:40   US RENAL  Result Date: 06/12/2022 CLINICAL DATA:  Acute renal failure EXAM: RENAL / URINARY TRACT ULTRASOUND COMPLETE COMPARISON:  CT chest abdomen and pelvis 06/08/2022 FINDINGS: Right Kidney: Renal measurements: 9.7 x 4.3 x 3.8 cm = volume: 83 mL. Echogenicity is within normal limits. No mass or hydronephrosis visualized. Left Kidney: Renal measurements: 8.9 x 5.0 x 4.4 cm = volume: 103 mL. Echogenicity is within normal limits. No mass or hydronephrosis visualized. Bladder: Appears normal for degree of bladder distention. Other: Trace free fluid in the pelvis. IMPRESSION: 1. No hydronephrosis. 2. Trace free fluid in the pelvis. Electronically Signed   By: Ronney Asters M.D.   On:  06/12/2022 18:06     Medications:    sodium chloride Stopped (06/13/22 1953)   cefTRIAXone (ROCEPHIN)  IV Stopped (06/14/22 6073)   dexmedetomidine (PRECEDEX) IV infusion 1.2 mcg/kg/hr (06/14/22 1000)   feeding supplement (VITAL HIGH PROTEIN) 40 mL/hr at 06/14/22 0925   fentaNYL infusion INTRAVENOUS 200 mcg/hr (06/14/22 0925)   norepinephrine (LEVOPHED) Adult infusion 7 mcg/min (06/14/22 0925)   propofol (DIPRIVAN) infusion 60 mcg/kg/min (06/14/22 0959)    budesonide (PULMICORT) nebulizer solution  0.5 mg Nebulization BID   calcitRIOL  0.25 mcg Per Tube Daily   Chlorhexidine Gluconate Cloth  6 each Topical Daily   diltiazem  240 mg Oral Daily   docusate  100 mg Per Tube BID   fentaNYL (SUBLIMAZE) injection  25 mcg Intravenous Once   fluocinonide-emollient   Topical QODAY   free water  30 mL Per Tube Q4H   heparin  5,000 Units Subcutaneous Q8H   ipratropium-albuterol  3 mL Nebulization Q6H   nutrition supplement (JUVEN)  1 packet Per Tube BID BM   mouth rinse  15 mL Mouth Rinse Q2H   pantoprazole (PROTONIX) IV  40 mg Intravenous Q24H   polyethylene glycol  17 g Per Tube Daily   rOPINIRole  1 mg Per Tube TID   benzonatate, fentaNYL, ipratropium-albuterol, midazolam, ondansetron **OR** ondansetron (ZOFRAN) IV, vecuronium  Assessment/ Plan:  Samantha Mason is a 72 y.o.  female  with past medical history including COPD with renal failure requiring 2 L oxygen as needed, chronic diastolic heart failure, sleep apnea, morbid obesity, hypertension, and chronic kidney disease stage IV. Patient presents to the emergency department feeling sick and was admitted for Sepsis [facility]) [A41.9] Cellulitis, unspecified cellulitis site [L03.90] Community acquired pneumonia, unspecified laterality [J18.9] Sepsis, due to unspecified organism, unspecified whether acute organ dysfunction present [facility]) [A41.9]   Acute Kidney Injury on chronic kidney disease stage IIIb with baseline creatinine 1.55 and  GFR of 36 on 04/17/22.  Acute kidney injury secondary to severe infection. No IV contrast exposure.  Diuretics held. Lower extremity edema present, however patient has history of lymphedema. Chest xray questionable to pulmonary edema.   Patient responded well to one-time dose of IV furosemide 80 mg yesterday with urine output recorded at 3.5 L.  Patient will receive additional one-time dose of IV furosemide 40 mg today.  Will initiate IV furosemide drip at 4 mg/h.   Lab Results  Component Value Date   CREATININE 2.06 (H) 06/14/2022   CREATININE 2.49 (H) 06/13/2022   CREATININE 2.85 (H) 06/12/2022    Intake/Output Summary (Last 24 hours) at 06/14/2022 1015 Last data filed at 06/14/2022 0901 Gross per 24 hour  Intake 2315.75 ml  Output 3800 ml  Net -1484.25 ml    2. Anemia of chronic kidney disease Lab Results  Component Value Date   HGB 9.0 (L) 06/14/2022   HGB 8.9 (L) 06/14/2022    Hemoglobin remains stable.  Will monitor for now.  3.  Chronic diastolic heart failure.  Echo from 07/16/21 shows EF 55 to 60% with mild LVH.  ECHO.pending  4. Secondary Hyperparathyroidism: with outpatient labs: PTH 79, phosphorus 3.0, calcium 10.0 on 04/17/22.   Will continue to monitor bone minerals during this admission.    LOS: 6 [place] 1/26/202410:15 AM

## 2022-06-15 ENCOUNTER — Inpatient Hospital Stay: Payer: Medicare Other

## 2022-06-15 DIAGNOSIS — A419 Sepsis, unspecified organism: Secondary | ICD-10-CM | POA: Diagnosis not present

## 2022-06-15 DIAGNOSIS — R6520 Severe sepsis without septic shock: Secondary | ICD-10-CM

## 2022-06-15 LAB — RENAL FUNCTION PANEL
Albumin: 2.4 g/dL — ABNORMAL LOW (ref 3.5–5.0)
Anion gap: 10 (ref 5–15)
BUN: 77 mg/dL — ABNORMAL HIGH (ref 8–23)
CO2: 29 mmol/L (ref 22–32)
Calcium: 8.5 mg/dL — ABNORMAL LOW (ref 8.9–10.3)
Chloride: 101 mmol/L (ref 98–111)
Creatinine, Ser: 1.87 mg/dL — ABNORMAL HIGH (ref 0.44–1.00)
GFR, Estimated: 29 mL/min — ABNORMAL LOW (ref >60)
Glucose, Bld: 194 mg/dL — ABNORMAL HIGH (ref 70–99)
Phosphorus: 1.9 mg/dL — ABNORMAL LOW (ref 2.5–4.6)
Potassium: 4.1 mmol/L (ref 3.5–5.1)
Sodium: 140 mmol/L (ref 135–145)

## 2022-06-15 LAB — GLUCOSE, CAPILLARY
Glucose-Capillary: 146 mg/dL — ABNORMAL HIGH (ref 70–99)
Glucose-Capillary: 163 mg/dL — ABNORMAL HIGH (ref 70–99)
Glucose-Capillary: 177 mg/dL — ABNORMAL HIGH (ref 70–99)
Glucose-Capillary: 189 mg/dL — ABNORMAL HIGH (ref 70–99)
Glucose-Capillary: 200 mg/dL — ABNORMAL HIGH (ref 70–99)
Glucose-Capillary: 202 mg/dL — ABNORMAL HIGH (ref 70–99)

## 2022-06-15 LAB — BLOOD GAS, ARTERIAL
Acid-Base Excess: 10.1 mmol/L — ABNORMAL HIGH (ref 0.0–2.0)
Acid-Base Excess: 11.2 mmol/L — ABNORMAL HIGH (ref 0.0–2.0)
Bicarbonate: 35.6 mmol/L — ABNORMAL HIGH (ref 20.0–28.0)
Bicarbonate: 37 mmol/L — ABNORMAL HIGH (ref 20.0–28.0)
FIO2: 50 %
FIO2: 60 %
MECHVT: 450 mL
MECHVT: 450 mL
Mechanical Rate: 20
Mechanical Rate: 20
O2 Saturation: 99.7 %
O2 Saturation: 99.8 %
PEEP: 8 cmH2O
PEEP: 8 cmH2O
Patient temperature: 37
Patient temperature: 37
pCO2 arterial: 50 mmHg — ABNORMAL HIGH (ref 32–48)
pCO2 arterial: 52 mmHg — ABNORMAL HIGH (ref 32–48)
pH, Arterial: 7.46 — ABNORMAL HIGH (ref 7.35–7.45)
pH, Arterial: 7.46 — ABNORMAL HIGH (ref 7.35–7.45)
pO2, Arterial: 98 mmHg (ref 83–108)
pO2, Arterial: 134 mmHg — ABNORMAL HIGH (ref 83–108)

## 2022-06-15 LAB — TRIGLYCERIDES: Triglycerides: 122 mg/dL (ref <150)

## 2022-06-15 LAB — CBC
HCT: 28.5 % — ABNORMAL LOW (ref 36.0–46.0)
Hemoglobin: 8.7 g/dL — ABNORMAL LOW (ref 12.0–15.0)
MCH: 24.2 pg — ABNORMAL LOW (ref 26.0–34.0)
MCHC: 30.5 g/dL (ref 30.0–36.0)
MCV: 79.2 fL — ABNORMAL LOW (ref 80.0–100.0)
Platelets: 220 10*3/uL (ref 150–400)
RBC: 3.6 MIL/uL — ABNORMAL LOW (ref 3.87–5.11)
RDW: 17.7 % — ABNORMAL HIGH (ref 11.5–15.5)
WBC: 14 10*3/uL — ABNORMAL HIGH (ref 4.0–10.5)
nRBC: 0.1 % (ref 0.0–0.2)

## 2022-06-15 LAB — PROCALCITONIN: Procalcitonin: 0.37 ng/mL

## 2022-06-15 LAB — MAGNESIUM: Magnesium: 1.9 mg/dL (ref 1.7–2.4)

## 2022-06-15 MED ORDER — SODIUM PHOSPHATES 45 MMOLE/15ML IV SOLN
15.0000 mmol | Freq: Once | INTRAVENOUS | Status: AC
  Administered 2022-06-15: 15 mmol via INTRAVENOUS
  Filled 2022-06-15: qty 5

## 2022-06-15 MED ORDER — DEXAMETHASONE SODIUM PHOSPHATE 4 MG/ML IJ SOLN
4.0000 mg | Freq: Four times a day (QID) | INTRAMUSCULAR | Status: DC
  Administered 2022-06-15 – 2022-06-17 (×8): 4 mg via INTRAVENOUS
  Filled 2022-06-15 (×8): qty 1

## 2022-06-15 NOTE — Consult Note (Addendum)
PHARMACY CONSULT NOTE - FOLLOW UP  Pharmacy Consult for Electrolyte Monitoring and Replacement   Recent Labs: Potassium (mmol/L)  Date Value  06/15/2022 4.1  05/25/2014 3.6   Magnesium (mg/dL)  Date Value  06/15/2022 1.9   Calcium (mg/dL)  Date Value  06/15/2022 8.5 (L)   Calcium, Total (mg/dL)  Date Value  05/25/2014 8.6   Albumin (g/dL)  Date Value  06/15/2022 2.4 (L)  05/25/2014 3.2 (L)   Phosphorus (mg/dL)  Date Value  06/15/2022 1.9 (L)   Sodium (mmol/L)  Date Value  06/15/2022 140  02/13/2022 135  05/25/2014 141     Assessment: 71 y.o. female with PMHx significant for HFpEF, COPD, OSA (noncompliant with CPAP), CKD Stage III who is  admitted with Sepsis in setting of lower extremity Cellulitis, AKI, and  Acute on Chronic Hypercapnic Respiratory Failure in setting AECOPD.  Course complicated by CO2 narcosis, failed BiPAP requiring intubation and mechanical ventilation. Pharmacy has been consulted to monitor and replace electrolytes while PCCM care.  Goal of Therapy:  Electrolytes WNL  Plan:  K 4.1  Mag 1.9  Phos 1.9  Scr 1.87  Na 140 -on lasix drip -on free water 56m q4h per tube - Will replace Phos with Sodium phosphate 15 mmol IV x 1 - Will recheck electrolytes with AM labs  MNoralee Space,PharmD Clinical Pharmacist 06/15/2022 8:16 AM

## 2022-06-15 NOTE — Plan of Care (Signed)
Neuro: stable on paralytic until 2nd half of shift, patient requiring significant sedation and paralytic titration for patient comfort see MAR Resp: stable on ventilator, 2nd half of the shift the patient began having intermittent cuff leaks which became more frequent-RT notified, Darel Hong NP notified upon arrival to unit-plan for tube exchange on day shift CV: TMAX 37.7 TLOW 35-Bair hugger utilized as needed, edema throughout, increased HR over the course of shift-addressed with E Ouma NP, instructed to notify for sustained >120 x 10 minutes, continued BP instability, Levo utilized as needed E Ouma NP notified GIGU: foley in place-lasix infusion running, Large BM this AM, feeds at goal-tolerating well Skin: interdry changed due to stool soiling, skin stable, no new wounds or lesions present Social: Daughter and Daughter in Sports coach arrived from [place] this evening, a long conversation with explanation and teaching regarding patient's status occurred, family expressed understanding of the seriousness of her illness, they expressed their opposition to a trach option but were weighing the options of rescinding the DNR, all questions and concerns addressed.  Problem: Fluid Volume: Goal: Hemodynamic stability will improve Outcome: Not Progressing   Problem: Clinical Measurements: Goal: Diagnostic test results will improve Outcome: Not Progressing Goal: Signs and symptoms of infection will decrease Outcome: Not Progressing   Problem: Respiratory: Goal: Ability to maintain adequate ventilation will improve Outcome: Not Progressing   Problem: Education: Goal: Knowledge of General Education information will improve Description: Including pain rating scale, medication(s)/side effects and non-pharmacologic comfort measures Outcome: Not Progressing   Problem: Health Behavior/Discharge Planning: Goal: Ability to manage health-related needs will improve Outcome: Not Progressing   Problem: Clinical  Measurements: Goal: Ability to maintain clinical measurements within normal limits will improve Outcome: Not Progressing Goal: Will remain free from infection Outcome: Not Progressing Goal: Diagnostic test results will improve Outcome: Not Progressing Goal: Respiratory complications will improve Outcome: Not Progressing Goal: Cardiovascular complication will be avoided Outcome: Not Progressing   Problem: Activity: Goal: Risk for activity intolerance will decrease Outcome: Not Progressing   Problem: Nutrition: Goal: Adequate nutrition will be maintained Outcome: Not Progressing   Problem: Coping: Goal: Level of anxiety will decrease Outcome: Not Progressing   Problem: Elimination: Goal: Will not experience complications related to bowel motility Outcome: Not Progressing Goal: Will not experience complications related to urinary retention Outcome: Not Progressing   Problem: Pain Managment: Goal: General experience of comfort will improve Outcome: Not Progressing   Problem: Safety: Goal: Ability to remain free from injury will improve Outcome: Not Progressing   Problem: Skin Integrity: Goal: Risk for impaired skin integrity will decrease Outcome: Not Progressing

## 2022-06-15 NOTE — Progress Notes (Signed)
[facility]  ROUNDING NOTE   Subjective:   Samantha Mason  is a 72 year old female with past medical history including COPD with renal failure requiring 2 L oxygen as needed, chronic diastolic heart failure, sleep apnea, morbid obesity, hypertension, and chronic kidney disease stage IV. Patient presents to the emergency department feeling sick for a few days.  She was admitted for Sepsis [facility]) [A41.9] Cellulitis, unspecified cellulitis site [L03.90] Community acquired pneumonia, unspecified laterality [J18.9] Sepsis, due to unspecified organism, unspecified whether acute organ dysfunction present [facility]) [A41.9]  Patient is followed by our office outpatient. She was last seen in office on Apr 17, 2022.   Patient remains critically ill. She remains intubated and sedated and on multiple drips. She is currently on Lasix drip 4 mg/h. She had a good diuretic response to this at 4.5 L over the preceding 24 hours. Family member at bedside.   Objective:  Vital signs in last 24 hours:  Temp:  [96.8 F (36 C)-99.9 F (37.7 C)] 97.5 F (36.4 C) (01/27 1030) Pulse Rate:  [57-137] 102 (01/27 1030) Resp:  [16-23] 23 (01/27 1030) BP: (112-137)/(43-54) 118/45 (01/26 1230) SpO2:  [93 %-99 %] 95 % (01/27 1044) Arterial Line BP: (65-205)/(34-95) 134/43 (01/27 1030) FiO2 (%):  [50 %-100 %] 50 % (01/27 1044) Weight:  [151.4 kg] 151.4 kg (01/27 0500)  Weight change: -7.9 kg Filed Weights   06/09/22 1139 06/14/22 0223 06/15/22 0500  Weight: (!) 158.3 kg (!) 159.3 kg (!) 151.4 kg    Intake/Output: I/O last 3 completed shifts: In: 5147.7 [I.V.:2780.9; NG/GT:2067; IV Piggyback:299.9] Out: 5605 [Urine:5605]   Intake/Output this shift:  Total I/O In: 371.6 [I.V.:123.3; NG/GT:210; IV Piggyback:38.4] Out: 450 [Urine:450]  Physical Exam: General: Critically ill-appearing  Head: Normocephalic, atraumatic.  Endotracheal tube in place  Eyes: Anicteric  Lungs:  Intubated on vent,  coarse breath sounds bilateral  Heart: Regular rate and rhythm  Abdomen:  Soft, nontender  Extremities: 2+ peripheral edema.  Neurologic: Intubated, sedated  Skin: No lesions  Access: No hemodialysis access    Basic Metabolic Panel: Recent Labs  Lab 06/11/22 0337 06/12/22 0736 06/13/22 0421 06/14/22 0404 06/15/22 0404  NA 139 139 139 139 140  K 4.4 4.8 4.6 3.5 4.1  CL 99 100 102 101 101  CO2 '29 22 25 26 29  '$ GLUCOSE 85 155* 157* 144* 194*  BUN 52* 70* 75* 70* 77*  CREATININE 2.71* 2.85* 2.49* 2.06* 1.87*  CALCIUM 8.3* 8.2* 8.5* 8.9 8.5*  MG  --  1.7 2.2 2.1 1.9  PHOS  --  6.6* 5.4* 3.6 1.9*     Liver Function Tests: Recent Labs  Lab 06/08/22 1918 06/09/22 0716 06/12/22 0736 06/13/22 0421 06/14/22 0404 06/15/22 0404  AST 17 17  --   --   --   --   ALT 8 10  --   --   --   --   ALKPHOS 54 60  --   --   --   --   BILITOT 0.6 0.6  --   --   --   --   PROT 6.6 7.2  --   --   --   --   ALBUMIN 2.4* 2.5* 2.5* 2.6* 2.5* 2.4*    No results for input(s): "LIPASE", "AMYLASE" in the last 168 hours. Recent Labs  Lab 06/13/22 0421  AMMONIA 20     CBC: Recent Labs  Lab 06/08/22 1918 06/09/22 9518 06/11/22 8416 06/12/22 6063 06/13/22 0421 06/14/22 0404  06/15/22 0404  WBC 12.9*   < > 11.4* 6.1 8.6 15.8*  16.1* 14.0*  NEUTROABS 11.3*  --   --   --   --  13.4*  --   HGB 8.0*   < > 8.2* 9.2* 9.7* 8.9*  9.0* 8.7*  HCT 27.7*   < > 29.5* 31.5* 32.6* 30.7*  30.3* 28.5*  MCV 83.2   < > 86.8 81.8 79.9* 81.9  81.9 79.2*  PLT 221   < > 250 159 247 310  319 220   < > = values in this interval not displayed.     Cardiac Enzymes: No results for input(s): "CKTOTAL", "CKMB", "CKMBINDEX", "TROPONINI" in the last 168 hours.  BNP: Invalid input(s): "POCBNP"  CBG: Recent Labs  Lab 06/14/22 1114 06/14/22 1558 06/15/22 0001 06/15/22 0339 06/15/22 0732  GLUCAP 137* 146* 163* 146* 177*     Microbiology: Results for orders placed or performed during the  hospital encounter of 06/08/22  Blood Culture (routine x 2)     Status: None   Collection Time: 06/08/22  7:18 PM   Specimen: BLOOD  Result Value Ref Range Status   Specimen Description BLOOD BLOOD LEFT ARM  Final   Special Requests   Final    BOTTLES DRAWN AEROBIC AND ANAEROBIC Blood Culture adequate volume   Culture   Final    NO GROWTH 5 DAYS Performed at [facility], [address], [place], [place] [address]    Report Status 06/13/2022 FINAL  Final  Blood Culture (routine x 2)     Status: None   Collection Time: 06/08/22  7:18 PM   Specimen: BLOOD  Result Value Ref Range Status   Specimen Description BLOOD BLOOD RIGHT ARM  Final   Special Requests   Final    BOTTLES DRAWN AEROBIC AND ANAEROBIC Blood Culture adequate volume   Culture   Final    NO GROWTH 5 DAYS Performed at [facility], [address] [place], [place] [address]    Report Status 06/13/2022 FINAL  Final  Resp panel by RT-PCR (RSV, Flu A&B, Covid) Anterior Nasal Swab     Status: None   Collection Time: 06/08/22  7:18 PM   Specimen: Anterior Nasal Swab  Result Value Ref Range Status   SARS Coronavirus 2 by RT PCR NEGATIVE NEGATIVE Final    Comment: (NOTE) SARS-CoV-2 target nucleic acids are NOT DETECTED.  The SARS-CoV-2 RNA is generally detectable in upper respiratory specimens during the acute phase of infection. The lowest concentration of SARS-CoV-2 viral copies this assay can detect is 138 copies/mL. A negative result does not preclude SARS-Cov-2 infection and should not be used as the sole basis for treatment or other patient management decisions. A negative result may occur with  improper specimen collection/handling, submission of specimen other than nasopharyngeal swab, presence of viral mutation(s) within the areas targeted by this assay, and inadequate number of viral copies(<138 copies/mL). A negative result must be combined with clinical observations, patient history, and  epidemiological information. The expected result is Negative.  Fact Sheet for Patients:  EntrepreneurPulse.com.au  Fact Sheet for Healthcare Providers:  IncredibleEmployment.be  This test is no t yet approved or cleared by the [place] FDA and  has been authorized for detection and/or diagnosis of SARS-CoV-2 by FDA under an Emergency Use Authorization (EUA). This EUA will remain  in effect (meaning this test can be used) for the duration of the COVID-19 declaration under Section 564(b)(1) of the Act, 21  U.S.C.section 360bbb-3(b)(1), unless the authorization is terminated  or revoked sooner.       Influenza A by PCR NEGATIVE NEGATIVE Final   Influenza B by PCR NEGATIVE NEGATIVE Final    Comment: (NOTE) The Xpert Xpress SARS-CoV-2/FLU/RSV plus assay is intended as an aid in the diagnosis of influenza from Nasopharyngeal swab specimens and should not be used as a sole basis for treatment. Nasal washings and aspirates are unacceptable for Xpert Xpress SARS-CoV-2/FLU/RSV testing.  Fact Sheet for Patients: EntrepreneurPulse.com.au  Fact Sheet for Healthcare Providers: IncredibleEmployment.be  This test is not yet approved or cleared by the [place] FDA and has been authorized for detection and/or diagnosis of SARS-CoV-2 by FDA under an Emergency Use Authorization (EUA). This EUA will remain in effect (meaning this test can be used) for the duration of the COVID-19 declaration under Section 564(b)(1) of the Act, 21 U.S.C. section 360bbb-3(b)(1), unless the authorization is terminated or revoked.     Resp Syncytial Virus by PCR NEGATIVE NEGATIVE Final    Comment: (NOTE) Fact Sheet for Patients: EntrepreneurPulse.com.au  Fact Sheet for Healthcare Providers: IncredibleEmployment.be  This test is not yet approved or cleared by the [place] FDA and has been  authorized for detection and/or diagnosis of SARS-CoV-2 by FDA under an Emergency Use Authorization (EUA). This EUA will remain in effect (meaning this test can be used) for the duration of the COVID-19 declaration under Section 564(b)(1) of the Act, 21 U.S.C. section 360bbb-3(b)(1), unless the authorization is terminated or revoked.  Performed at [facility], [address] [place], [place] [address]   Urine Culture     Status: None   Collection Time: 06/08/22  9:34 PM   Specimen: In/Out Cath Urine  Result Value Ref Range Status   Specimen Description   Final    IN/OUT CATH URINE Performed at [facility], [address] [place], [place] [address]    Special Requests   Final    NONE Performed at [facility], [address] [place], [place] [address]    Culture   Final    NO GROWTH Performed at [facility], [place] [address], [place], [place] [address]    Report Status 06/10/2022 FINAL  Final  MRSA Next Gen by PCR, Nasal     Status: None   Collection Time: 06/10/22 11:49 PM   Specimen: Nasal Mucosa; Nasal Swab  Result Value Ref Range Status   MRSA by PCR Next Gen NOT DETECTED NOT DETECTED Final    Comment: (NOTE) The GeneXpert MRSA Assay (FDA approved for NASAL specimens only), is one component of a comprehensive MRSA colonization surveillance program. It is not intended to diagnose MRSA infection nor to guide or monitor treatment for MRSA infections. Test performance is not FDA approved in patients less than 82 years old. Performed at [facility], [place], [place] [address]   Respiratory (~20 pathogens) panel by PCR     Status: None   Collection Time: 06/12/22  6:15 AM   Specimen: Nasopharyngeal Swab; Respiratory  Result Value Ref Range Status   Adenovirus NOT DETECTED NOT DETECTED Final   Coronavirus 229E NOT DETECTED NOT DETECTED Final    Comment: (NOTE) The Coronavirus on the Respiratory Panel, DOES  NOT test for the novel  Coronavirus (2019 nCoV)    Coronavirus HKU1 NOT DETECTED NOT DETECTED Final   Coronavirus NL63 NOT DETECTED NOT DETECTED Final   Coronavirus OC43 NOT DETECTED NOT DETECTED Final  Metapneumovirus NOT DETECTED NOT DETECTED Final   Rhinovirus / Enterovirus NOT DETECTED NOT DETECTED Final   Influenza A NOT DETECTED NOT DETECTED Final   Influenza B NOT DETECTED NOT DETECTED Final   Parainfluenza Virus 1 NOT DETECTED NOT DETECTED Final   Parainfluenza Virus 2 NOT DETECTED NOT DETECTED Final   Parainfluenza Virus 3 NOT DETECTED NOT DETECTED Final   Parainfluenza Virus 4 NOT DETECTED NOT DETECTED Final   Respiratory Syncytial Virus NOT DETECTED NOT DETECTED Final   Bordetella pertussis NOT DETECTED NOT DETECTED Final   Bordetella Parapertussis NOT DETECTED NOT DETECTED Final   Chlamydophila pneumoniae NOT DETECTED NOT DETECTED Final   Mycoplasma pneumoniae NOT DETECTED NOT DETECTED Final    Comment: Performed at [facility], [place] [address] [place], [place] [address]    Coagulation Studies: No results for input(s): "LABPROT", "INR" in the last 72 hours.  Urinalysis: No results for input(s): "COLORURINE", "LABSPEC", "PHURINE", "GLUCOSEU", "HGBUR", "BILIRUBINUR", "KETONESUR", "PROTEINUR", "UROBILINOGEN", "NITRITE", "LEUKOCYTESUR" in the last 72 hours.  Invalid input(s): "APPERANCEUR"    Imaging: DG Abd 1 View  Result Date: 06/15/2022 CLINICAL DATA:  Orogastric tube placement EXAM: ABDOMEN - 1 VIEW COMPARISON:  05/14/2022 FINDINGS: Limited radiograph of the lower chest and upper abdomen was obtained for the purposes of enteric tube localization. Enteric tube is seen coursing below the diaphragm with distal tip and side port terminating within the expected location of the gastric body. IMPRESSION: Enteric tube terminates within the gastric body. Electronically Signed   By: Davina Poke D.O.   On: 06/15/2022 09:14   DG Chest Port 1 View  Result Date:  06/15/2022 CLINICAL DATA:  Intubated EXAM: PORTABLE CHEST 1 VIEW COMPARISON:  06/15/2022 at 0507 hours FINDINGS: Endotracheal tube has been slightly retracted, now terminating 2.6 cm above the carina. Enteric tube courses below the diaphragm with distal tip beyond the inferior margin of the film. Esophageal temperature probe is seen. Stable left IJ central venous catheter. Stable cardiomegaly. Perihilar airspace opacities, not significantly changed. Probable small bilateral pleural effusions. No pneumothorax. IMPRESSION: 1. Endotracheal tube has been slightly retracted, now terminating 2.6 cm above the carina. 2. Otherwise no significant interval change. Electronically Signed   By: Davina Poke D.O.   On: 06/15/2022 09:14   DG Chest Port 1 View  Result Date: 06/15/2022 CLINICAL DATA:  Ventilator dependent respiratory failure. [id number]. Hypoxia and hypercapnia. EXAM: PORTABLE CHEST 1 VIEW COMPARISON:  Portable chest yesterday at 12:11 p.m. FINDINGS: 5:11 a.m. ETT has been advanced to 2 cm from the carina. Esophageal temperature probe remains in the upper thoracic esophagus. NGT terminates in the gastric antrum based on the position of the side hole with the tip not filmed. Right IJ central line tip is in the distal SVC. Stable cardiomegaly and mediastinum. Aortic atherosclerosis. Central vascular prominence is unchanged. Perihilar opacities extending out into the mid to lower lung fields are again noted and could be due to edema, pneumonia or combination. Small pleural effusions appear similar with no new or worsening lung opacities. Lung apices remain clear. IMPRESSION: 1. ETT has been advanced to 2 cm from the carina. 2. No other interval change. Stable perihilar opacities and small pleural effusions. 3. Stable cardiomegaly and central vascular prominence. Electronically Signed   By: Telford Nab M.D.   On: 06/15/2022 06:51   EEG adult  Result Date: 06/14/2022 Greta Doom, MD     06/14/2022   6:55 PM History: 72 yo F with encephalopathy Sedation: none Technique: This EEG  was acquired with electrodes placed according to the International 10-20 electrode system (including Fp1, Fp2, F3, F4, C3, C4, P3, P4, O1, O2, T3, T4, T5, T6, A1, A2, Fz, Cz, Pz). The following electrodes were missing or displaced: none. Background: The background consists of generalized irregular slow activity, predominantly delta range with some intermixed theta. There was no evolution, rhythmicity, or other concerning features. There was no PDR seen. There was no epileptiform activity seen. Photic stimulation: Physiologic driving is not performed EEG Abnormalities: 1) Generalized irregular slow activity 2) Absent PDR Clinical Interpretation: This normal EEG is consistent with a generalized non-specific cerebral dysfunction(encephalopathy). There was no seizure or seizure predisposition recorded on this study. Please note that lack of epileptiform activity on EEG does not preclude the possibility of epilepsy. Roland Rack, MD [facility] [phone] If 7pm- 7am, please page neurology on call as listed in [place].   DG Chest Port 1 View  Result Date: 06/14/2022 CLINICAL DATA:  Hypoxia. EXAM: PORTABLE CHEST 1 VIEW COMPARISON:  June 13, 2022. FINDINGS: Stable cardiomediastinal silhouette. Endotracheal and nasogastric tubes are unchanged in position. Left internal jugular catheter is noted with tip in expected position of the SVC. Stable bilateral lung opacities consistent with edema, pneumonia or atelectasis. Bony thorax is unremarkable. IMPRESSION: Stable support apparatus.  Stable bilateral lung opacities. Electronically Signed   By: Marijo Conception M.D.   On: 06/14/2022 12:28   DG Chest 1 View  Result Date: 06/13/2022 CLINICAL DATA:  [id number] Endotracheally intubated [id number] EXAM: CHEST  1 VIEW COMPARISON:  Chest x-ray 06/13/2022 6:18 p.m. FINDINGS: Endotracheal tube with tip 2 cm above the carina. Left  internal jugular central venous catheter with tip overlying the expected region of the superior cavoatrial junction. Enteric tube coursing below the hemidiaphragm with tip and side port collimated off view. The heart and mediastinal contours are unchanged. Aortic calcification. Low lung volumes. Diffuse patchy airspace opacities. No pulmonary edema. At least bilateral trace pleural effusions.  No pneumothorax. No acute osseous abnormality. IMPRESSION: 1. Lines and tubes as above. 2. Low lung volumes with diffuse patchy airspace opacities. Followup PA and lateral chest X-ray is recommended in 3-4 weeks following therapy to ensure resolution and exclude underlying malignancy. 3. At least bilateral trace pleural effusions. Electronically Signed   By: Iven Finn M.D.   On: 06/13/2022 23:37   DG Chest 1 View  Result Date: 06/13/2022 CLINICAL DATA:  Central line EXAM: CHEST  1 VIEW COMPARISON:  Chest x-ray 06/13/2022 FINDINGS: There is an endotracheal tube with distal tip 3.1 cm above the carina. Enteric tube extends into the stomach. There is a new left-sided central venous catheter with distal tip projecting over the distal SVC. The heart is enlarged, unchanged. Bilateral multifocal mid and lower lung airspace opacities are similar to prior. There is a stable small left pleural effusion. There is no pneumothorax or acute fracture. IMPRESSION: 1. New left-sided central venous catheter with distal tip projecting over the distal SVC. No pneumothorax. 2. Stable bilateral multifocal airspace opacities and small left pleural effusion. 3. Stable cardiomegaly. Electronically Signed   By: Ronney Asters M.D.   On: 06/13/2022 18:34   CT HEAD WO CONTRAST (5MM)  Result Date: 06/13/2022 CLINICAL DATA:  Altered mental status EXAM: CT HEAD WITHOUT CONTRAST TECHNIQUE: Contiguous axial images were obtained from the base of the skull through the vertex without intravenous contrast. RADIATION DOSE REDUCTION: This exam was  performed according to the departmental dose-optimization program which includes automated exposure control, adjustment of the  mA and/or kV according to patient size and/or use of iterative reconstruction technique. COMPARISON:  CT Head 04/30/22 FINDINGS: Brain: No evidence of acute infarction, hemorrhage, hydrocephalus, extra-axial collection or mass lesion/mass effect. Vascular: No hyperdense vessel or unexpected calcification. Skull: Normal. Negative for fracture or focal lesion. Sinuses/Orbits: No acute finding. Other: None IMPRESSION: No acute intracranial abnormality. Electronically Signed   By: Marin Roberts M.D.   On: 06/13/2022 17:29   DG Chest Port 1 View  Result Date: 06/13/2022 CLINICAL DATA:  Endotracheal tube. EXAM: PORTABLE CHEST 1 VIEW COMPARISON:  Same day. FINDINGS: Endotracheal and nasogastric tubes are in grossly good position. Stable bilateral lung opacities. IMPRESSION: Endotracheal and nasogastric tubes are in grossly good position. Electronically Signed   By: Marijo Conception M.D.   On: 06/13/2022 13:51     Medications:    sodium chloride Stopped (06/15/22 0830)   ceFEPime (MAXIPIME) IV Stopped (06/15/22 0311)   cisatracurium (NIMBEX) 200 mg in sodium chloride 0.9 % 100 mL (2 mg/mL) infusion 2 mcg/kg/min (06/15/22 1000)   dexmedetomidine (PRECEDEX) IV infusion Stopped (06/14/22 1851)   feeding supplement (VITAL HIGH PROTEIN) 60 mL/hr at 06/15/22 1000   fentaNYL infusion INTRAVENOUS 100 mcg/hr (06/15/22 1000)   furosemide (LASIX) 200 mg in dextrose 5 % 100 mL (2 mg/mL) infusion 4 mg/hr (06/15/22 1000)   norepinephrine (LEVOPHED) Adult infusion Stopped (06/15/22 0853)   propofol (DIPRIVAN) infusion 15 mcg/kg/min (06/15/22 1000)   sodium phosphate 15 mmol in dextrose 5 % 250 mL infusion 43 mL/hr at 06/15/22 1000    artificial tears  1 Application Both Eyes I9S   budesonide (PULMICORT) nebulizer solution  0.5 mg Nebulization BID   calcitRIOL  0.25 mcg Per Tube Daily    Chlorhexidine Gluconate Cloth  6 each Topical Daily   dexamethasone (DECADRON) injection  4 mg Intravenous Q6H   diltiazem  240 mg Oral Daily   docusate  100 mg Per Tube BID   fentaNYL (SUBLIMAZE) injection  25 mcg Intravenous Once   fluocinonide-emollient   Topical QODAY   free water  30 mL Per Tube Q4H   heparin  5,000 Units Subcutaneous Q8H   ipratropium-albuterol  3 mL Nebulization Q4H   nutrition supplement (JUVEN)  1 packet Per Tube BID BM   mouth rinse  15 mL Mouth Rinse Q2H   pantoprazole (PROTONIX) IV  40 mg Intravenous Q24H   polyethylene glycol  17 g Per Tube Daily   rocuronium bromide  100 mg Intravenous Once   rOPINIRole  1 mg Per Tube TID   benzonatate, fentaNYL, ipratropium-albuterol, midazolam, ondansetron **OR** ondansetron (ZOFRAN) IV, vecuronium  Assessment/ Plan:  Ms. Samantha Mason is a 72 y.o.  female  with past medical history including COPD with renal failure requiring 2 L oxygen as needed, chronic diastolic heart failure, sleep apnea, morbid obesity, hypertension, and chronic kidney disease stage IV. Patient presents to the emergency department feeling sick and was admitted for Sepsis [facility]) [A41.9] Cellulitis, unspecified cellulitis site [L03.90] Community acquired pneumonia, unspecified laterality [J18.9] Sepsis, due to unspecified organism, unspecified whether acute organ dysfunction present [facility]) [A41.9]   Acute Kidney Injury on chronic kidney disease stage IIIb with baseline creatinine 1.55 and GFR of 36 on 04/17/22.  Acute kidney injury secondary to severe infection. No IV contrast exposure.  Diuretics held. Lower extremity edema present, however patient has history of lymphedema. Chest xray questionable to pulmonary edema.   Patient continues to do quite well on Lasix drip.  Therefore we will continue this  for 1 additional day at 4 mg/h.  Creatinine down to 1.87 with excellent urine output of 4.5 L.  No indication for dialysis.   Lab Results  Component  Value Date   CREATININE 1.87 (H) 06/15/2022   CREATININE 2.06 (H) 06/14/2022   CREATININE 2.49 (H) 06/13/2022    Intake/Output Summary (Last 24 hours) at 06/15/2022 1103 Last data filed at 06/15/2022 1000 Gross per 24 hour  Intake 3089.78 ml  Output 4055 ml  Net -965.22 ml    2. Anemia of chronic kidney disease Lab Results  Component Value Date   HGB 8.7 (L) 06/15/2022    No need for erythropoietin stimulating agents immediately.  May need to consider this over the course of the hospitalization.  3.  Chronic diastolic heart failure.  Echo from 07/16/21 shows EF 55 to 60% with mild LVH.  Responding well to Lasix drip at 4 mg/h.  We will continue for an additional day.  4. Secondary Hyperparathyroidism: with outpatient labs: PTH 79, phosphorus 3.0, calcium 10.0 on 04/17/22.   Will continue to monitor bone minerals during this admission.   .5.  Acute respiratory failure.  Patient maintained on ventilatory support at this time.  We are performing diuresis to help patient wean off the ventilator.   LOS: 7 Christel Bai 1/27/202411:03 AM

## 2022-06-15 NOTE — Progress Notes (Signed)
NAME:  Samantha Mason, MRN:  [id number], DOB:  Aug 07, 1950, LOS: 7 ADMISSION DATE:  06/08/2022, CONSULTATION DATE:  06/11/2022 REFERRING MD:  Dr. Manuella Ghazi, CHIEF COMPLAINT:  Altered Mental Status, Hypercapnia  Brief Pt Description / Synopsis:  72 y.o. female with PMHx significant for HFpEF, COPD, OSA (noncompliant with CPAP), CKD Stage III who is  admitted with Sepsis in setting of lower extremity Cellulitis, AKI, and  Acute on Chronic Hypercapnic Respiratory Failure in setting AECOPD.  Course complicated by CO2 narcosis, failed BiPAP requiring intubation and mechanical ventilation.  History of Present Illness:  Samantha Mason is a 72 y.o. female with medical history significant for Morbid obesity, chronic HFpEF,CKD stage III, chronic lower extremity lymphedema on treatment with torsemide, hypertension, asthma, stroke (1995) and obstructive sleep apnea(non compliant with cpap) who presented to [facility] ED on 06/08/22 due to shortness of breath and generalized malaise for appropriately 1 week.  Pt is currently altered and no family currently available, therefore history is obtained from chart review.  Per ED and nursing notes, she reported about a 1 week history of feeling weak and fatigued, chills, bilateral lower extremity swelling and erythema (R>L), paroxsymal nocturnal dyspnea and orthopnea, weight gain with increased oxygen requirements.  She denied abdominal pain, N/V/D, dysuria.  ED Course: Initial Vital Signs: 105F orally, BP 129/53, HR 109, RR 27, SpO2 96% Significant Labs: BUN 39, Creatinine 1.59, Albumin 2.4, lactic acid 1.1, WBC 12.9, Hgb 8.0, Hct 27.7, Hgb A1c 5.8 COVID/FLU/RSV negative UA negative for UTI Imaging Chest X-ray>>IMPRESSION: Mild cardiomegaly with central pulmonary vascular congestion. CT Chest/Abdomen/Pelvis>> IMPRESSION: 1. Skin thickening and soft tissue edema of the partially imaged proximal right thigh. Correlate for cellulitis. 2. Newly enlarged bilateral inguinal  and iliac lymph nodes, likely reactive. 3. Diffuse bilateral bronchial wall thickening and mosaic attenuation of the airspaces, consistent with nonspecific infectious or inflammatory bronchitis. 4. Cardiomegaly.  She met Sepsis criteria, therefore she received IV fluids and antibiotics. Hospitalists were asked to admit for further workup and treatment os sepsis due to cellulitis.  Please see "significant hospital events" section below for full detailed hospital course.  Pertinent  Medical History   Past Medical History:  Diagnosis Date   Acute bronchitis    Anemia    Arthritis    Asthma    B12 deficiency 03/14/2017   Chest pain 07/02/2018   Chest pain    a. 06/2018 MV: EF 66%. No ischemia/infarct; b. 02/2021 PET CT: EF>60%, no ischemia/infarct.   Chronic heart failure with preserved ejection fraction (HFpEF) ([place])    a. 08/2020 Echo: EF 55-60%, GrI DD; b. 06/2021 Echo: EF 55-60%, mild LVH, nl RV fxn, no signif valvular dzs.   COPD (chronic obstructive pulmonary disease) ([place]) 07/24/2018   Family history of blood clots 07/16/2017   Irritable bowel syndrome    Malignant hypertension    Obesity    OSA (obstructive sleep apnea)    uses cpap   Pneumonia 2017   Restless leg syndrome    Stroke ([place]) 1995   mild     Micro Data:  1/20: SARS-CoV-2/influenza/RSV PCR>> negative 1/20: Blood culture x 2>> no growth  1/20: Urine>> negative 1/22: MRSA PCR>>negative  1/24: Respiratory viral panel>>negative  1/26: Tracheal aspirate>> not collected   Antimicrobials:   Anti-infectives (From admission, onward)    Start     Dose/Rate Route Frequency Ordered Stop   06/14/22 1400  ceFEPIme (MAXIPIME) 2 g in sodium chloride 0.9 % 100 mL IVPB  2 g 200 mL/hr over 30 Minutes Intravenous Every 12 hours 06/14/22 1301     06/13/22 1000  ceFEPIme (MAXIPIME) 2 g in sodium chloride 0.9 % 100 mL IVPB  Status:  Discontinued        2 g 200 mL/hr over 30 Minutes Intravenous Every 24 hours  06/12/22 1430 06/13/22 0747   06/13/22 0900  cefTRIAXone (ROCEPHIN) 2 g in sodium chloride 0.9 % 100 mL IVPB  Status:  Discontinued        2 g 200 mL/hr over 30 Minutes Intravenous Every 24 hours 06/13/22 0747 06/14/22 1230   06/11/22 1145  vancomycin (VANCOREADY) IVPB 2000 mg/400 mL        2,000 mg 200 mL/hr over 120 Minutes Intravenous  Once 06/11/22 1047 06/11/22 1344   06/11/22 1145  ceFEPIme (MAXIPIME) 2 g in sodium chloride 0.9 % 100 mL IVPB  Status:  Discontinued        2 g 200 mL/hr over 30 Minutes Intravenous 2 times daily 06/11/22 1047 06/12/22 1430   06/10/22 2300  vancomycin (VANCOCIN) IVPB 1000 mg/200 mL premix  Status:  Discontinued       See Hyperspace for full Linked Orders Report.   1,000 mg 200 mL/hr over 60 Minutes Intravenous Every 48 hours 06/08/22 2307 06/09/22 0922   06/10/22 2300  vancomycin (VANCOREADY) IVPB 1250 mg/250 mL  Status:  Discontinued       See Hyperspace for full Linked Orders Report.   1,250 mg 166.7 mL/hr over 90 Minutes Intravenous Every 48 hours 06/08/22 2307 06/09/22 0922   06/10/22 1800  cephALEXin (KEFLEX) capsule 500 mg  Status:  Discontinued        500 mg Oral Every 6 hours 06/10/22 1521 06/11/22 1047   06/09/22 2200  vancomycin (VANCOREADY) IVPB 750 mg/150 mL  Status:  Discontinued        750 mg 150 mL/hr over 60 Minutes Intravenous Every 24 hours 06/09/22 0923 06/10/22 1521   06/09/22 1000  ceFEPIme (MAXIPIME) 2 g in sodium chloride 0.9 % 100 mL IVPB  Status:  Discontinued        2 g 200 mL/hr over 30 Minutes Intravenous Every 12 hours 06/09/22 0916 06/10/22 1521   06/08/22 2300  vancomycin (VANCOCIN) IVPB 1000 mg/200 mL premix  Status:  Discontinued        1,000 mg 200 mL/hr over 60 Minutes Intravenous  Once 06/08/22 2257 06/08/22 2307   06/08/22 2215  vancomycin (VANCOCIN) IVPB 1000 mg/200 mL premix       See Hyperspace for full Linked Orders Report.   1,000 mg 200 mL/hr over 60 Minutes Intravenous  Once 06/08/22 2210 06/09/22 0014    06/08/22 2215  vancomycin (VANCOREADY) IVPB 1500 mg/300 mL       See Hyperspace for full Linked Orders Report.   1,500 mg 150 mL/hr over 120 Minutes Intravenous  Once 06/08/22 2210 06/10/22 0807   06/08/22 1915  piperacillin-tazobactam (ZOSYN) IVPB 3.375 g        3.375 g 100 mL/hr over 30 Minutes Intravenous  Once 06/08/22 1909 06/08/22 [facility] Hospital Events: Including procedures, antibiotic start and stop dates in addition to other pertinent events   1/20: Presented to ED, admitted by [facility] 1/21: Added cefepime, continue vancomycin, increase dose of Cardizem for better heart rate control 1/22: Had to reduce Cardizem dose back to her home dose as blood pressure dropped 1/23: Transferred to ICU overnight due to CO2 narcosis, placed  on BiPAP.  PCCM consulted.  Worsening AKI, holding diuresis.  Agitation requiring Precedex, high risk for Intubation.  Palliative Care consulted for [place] conversations.  ABX broadened back to Cefepime and Vancomycin 1/24: Pt on Bipap but awake and able to follow commands.  Will attempt to transition off Bipap.  Palliative Care following along to assist with goals of care conversations  1/25: Pt currently on Bipap and precedex gtt.  Required 2.5 mg iv valium overnight for delirium.  Unable to obtain CT Head due to respiratory status.  Ultimately required intubation. 1/26: Remains critically ill. Issues with vent dyschrony and ventilation, plan to start Nimbex gtt. Creatinine improved with diuresis, continue with diuresis, Nephrology considering Lasix gtt. 1/27: ABG's improved on Nimbex, persistent cuff leak, exchanged ETT. Will attempt to d/c Nimbex. Good diuresis with Lasix gtt with 4.5 L of UOP last 24 hrs, Creatinine improving.  Interim History / Subjective:  -No significant events noted overnight ~ nursing does report frequent cuff leak with any position changes (bathing, turning, etc) which results in poor ventilation ~ will exchange  ETT -Afebrile, on low dose levophed -Vent settings improved to 50% FiO2 & 8 PEEP, ABG's improved on Nimbex ~ will see if can d/c Nimbex following tube exchange -Tolerating Lasix drip well, UOP 4.5 L last 24 hrs (net +2L), Creatinine improved to 1.87 from 2 ~ Nephrology following, will continue Lasix gtt for another day -Leukocytosis improved to 14 from 15.8 following broadening ABX back to Cefepime yesterday -EEG yesterday negative for seizures  Objective   Blood pressure (!) 118/45, pulse (!) 105, temperature 97.9 F (36.6 C), resp. rate 20, height '5\' 4"'$  (1.626 m), weight (!) 151.4 kg, SpO2 97 %.    Vent Mode: PRVC FiO2 (%):  [50 %-100 %] 50 % Set Rate:  [20 bmp] 20 bmp Vt Set:  [450 mL] 450 mL PEEP:  [8 cmH20] 8 cmH20 Plateau Pressure:  [29 cmH20-32 cmH20] 32 cmH20   Intake/Output Summary (Last 24 hours) at 06/15/2022 0809 Last data filed at 06/15/2022 0700 Gross per 24 hour  Intake 3316.49 ml  Output 4305 ml  Net -988.51 ml    Filed Weights   06/09/22 1139 06/14/22 0223 06/15/22 0500  Weight: (!) 158.3 kg (!) 159.3 kg (!) 151.4 kg    Examination: General: Acute on chronically ill-appearing obese female, laying in bed, intubated and sedated/paraylzed, in no acute distress HENT: Supple, difficult to assess JVD due to body habitus  Lungs: Diminished distant breath sounds throughout, even, non labored, synchronous with vent  Cardiovascular: NSR, s1s2, no m/r/g, 2+ radial/1+ distal pulses, no edema  Abdomen: +BS x4, obese, mild distension, mild tenderness present   Extremities: All extremities edematous, bilateral LE lymphedema with unna boots in place Neuro: Sedated/paralyzed on nimbex, not following commands, pupils PERRL  GU: Indwelling foley catheter drained yellow urine  Skin: Cellulitis to bilateral lower extremities, yeast excoriation to abdominal fold/groins  Resolved Hospital Problem list     Assessment & Plan:   Acute on Chronic Hypercapnic Respiratory Failure in  setting of AECOPD & acute CHF exacerbation vs ARDS PMHx: OSA (noncompliant with CPAP at home), morbid obesity with probable OHS -Full vent support, implement lung protective strategies -Plateau pressures less than 30 cm H20 -Wean FiO2 & PEEP as tolerated to maintain O2 sats 88 to 92% -Follow intermittent Chest X-ray & ABG as needed -Spontaneous Breathing Trials when respiratory parameters met and mental status permits -Implement VAP Bundle -Bronchodilators & Pulmicort nebs -Start Decadron 1/27 given upper airway  edema noted on ETT exchange - ABX as above - Diuresis as BP and renal function permits - Pulmonary toilet as able  Acute on Chronic HFpEF Hypotension PMHx: HTN Echocardiogram 07/16/21: LVEF 55-60%, indeterminate diastolic parameters, RV systolic function normal -Continuous cardiac monitoring -Maintain MAP >65 -Vasopressors as needed to maintain MAP goal -Diuresis as BP and renal function permits ~ Continue Lasix gtt -Nephrology following, appreciate input -Repeat Echocardiogram on 06/12/22 ~ report results still pending  Sepsis secondary to Cellulitis of Lower extremities (Meets SIRS Criteria: HR >90, RR >20, fever) -Monitor fever curve -Trend WBC's & Procalcitonin -Follow cultures as above -Continue empiric Cefepime and Vancomycin pending cultures & sensitivities  Acute Kidney Injury on CKD III ~ Improving  - Trend BMP  - Replace electrolytes as indicated  - Monitor UOP - Avoid nephrotoxic medication as able  - Nephrology following, appreciate input   Anemia of Chronic Disease - Trend CBC - Monitor for s/sx of bleeding  - Transfuse for hgb <7  Acute Metabolic Encephalopathy in the setting of CO2 Narcosis and multiple metabolic derangements Sedation needs in setting of Mechanical Ventilation CT Head 1/25 negative for acute intracranial abnormality EEG 1/26 negative for seizures -Maintain a RASS goal of -3 to -4 (higher RASS goal for now given issues with vent  dyschrony and ventilation) -Fentanyl , Propofol, and Precedex as neeed to maintain RASS goal -Avoid sedating medications as able -Daily wake up assessment    Pt is critically ill with multiorgan failure superimposed on multiple chronic co morbidities and morbid obesity.  High risk for further decompensation, cardiac arrest and death.  Anticipate she will be difficult to liberate from the ventilator, and may likely need Tracheotomy. She is DNR status, and family at bedside reports that pt WOULD NOT WANT a TRACHEOSTOMY, and they do not wish to pursue trach or feeding tube.  They wish to give her the 14 days of aggressive treatment and intubation and assess her response.  Palliative Care following to assist with goals of care.  Best Practice (right click and "Reselect all SmartList Selections" daily)   Diet/type: NPO, start tube feeds DVT prophylaxis: prophylactic heparin  GI prophylaxis: PPI Lines: Left IJ, and is still needed, left radial A-line and is still needed Foley:  foley catheter, and is still needed Code Status:  DNR Last date of multidisciplinary goals of care discussion [06/15/22]  1/27: Updated pt's daughter and granddaughter at bedside.  All questions answered.  Labs   CBC: Recent Labs  Lab 06/08/22 1918 06/09/22 0716 06/11/22 0337 06/12/22 0736 06/13/22 0421 06/14/22 0404 06/15/22 0404  WBC 12.9*   < > 11.4* 6.1 8.6 15.8*  16.1* 14.0*  NEUTROABS 11.3*  --   --   --   --  13.4*  --   HGB 8.0*   < > 8.2* 9.2* 9.7* 8.9*  9.0* 8.7*  HCT 27.7*   < > 29.5* 31.5* 32.6* 30.7*  30.3* 28.5*  MCV 83.2   < > 86.8 81.8 79.9* 81.9  81.9 79.2*  PLT 221   < > 250 159 247 310  319 220   < > = values in this interval not displayed.     Basic Metabolic Panel: Recent Labs  Lab 06/11/22 0337 06/12/22 0736 06/13/22 0421 06/14/22 0404 06/15/22 0404  NA 139 139 139 139 140  K 4.4 4.8 4.6 3.5 4.1  CL 99 100 102 101 101  CO2 '29 22 25 26 29  '$ GLUCOSE 85 155* 157* 144* 194*  BUN 52* 70* 75* 70* 77*  CREATININE 2.71* 2.85* 2.49* 2.06* 1.87*  CALCIUM 8.3* 8.2* 8.5* 8.9 8.5*  MG  --  1.7 2.2 2.1 1.9  PHOS  --  6.6* 5.4* 3.6 1.9*    GFR: Estimated Creatinine Clearance: 41.3 mL/min (A) (by C-G formula based on SCr of 1.87 mg/dL (H)). Recent Labs  Lab 06/08/22 1918 06/08/22 2109 06/09/22 0716 06/10/22 0335 06/12/22 0736 06/13/22 0421 06/14/22 0404 06/15/22 0404  PROCALCITON  --   --  0.87  --   --   --  0.28 0.37  WBC 12.9*  --  11.0*   < > 6.1 8.6 15.8*  16.1* 14.0*  LATICACIDVEN 1.1 0.9  --   --   --   --   --   --    < > = values in this interval not displayed.     Liver Function Tests: Recent Labs  Lab 06/08/22 1918 06/09/22 0716 06/12/22 0736 06/13/22 0421 06/14/22 0404 06/15/22 0404  AST 17 17  --   --   --   --   ALT 8 10  --   --   --   --   ALKPHOS 54 60  --   --   --   --   BILITOT 0.6 0.6  --   --   --   --   PROT 6.6 7.2  --   --   --   --   ALBUMIN 2.4* 2.5* 2.5* 2.6* 2.5* 2.4*    No results for input(s): "LIPASE", "AMYLASE" in the last 168 hours. Recent Labs  Lab 06/13/22 0421  AMMONIA 20     ABG    Component Value Date/Time   PHART 7.46 (H) 06/15/2022 0404   PCO2ART 50 (H) 06/15/2022 0404   PO2ART 98 06/15/2022 0404   HCO3 35.6 (H) 06/15/2022 0404   ACIDBASEDEF 1.4 06/12/2022 0736   O2SAT 99.7 06/15/2022 0404     Coagulation Profile: Recent Labs  Lab 06/08/22 1918  INR 1.3*     Cardiac Enzymes: No results for input(s): "CKTOTAL", "CKMB", "CKMBINDEX", "TROPONINI" in the last 168 hours.  HbA1C: Hgb A1c MFr Bld  Date/Time Value Ref Range Status  06/08/2022 07:16 AM 5.8 (H) 4.8 - 5.6 % Final    Comment:    (NOTE) Pre diabetes:          5.7%-6.4%  Diabetes:              >6.4%  Glycemic control for   <7.0% adults with diabetes   07/15/2021 04:30 PM 5.4 4.8 - 5.6 % Final    Comment:    (NOTE) Pre diabetes:          5.7%-6.4%  Diabetes:              >6.4%  Glycemic control for    <7.0% adults with diabetes     CBG: Recent Labs  Lab 06/14/22 1114 06/14/22 1558 06/15/22 0001 06/15/22 0339 06/15/22 0732  GLUCAP 137* 146* 163* 146* 177*     Review of Systems:   Unable to assess due to AMS/intubation/sedation  Past Medical History:  She,  has a past medical history of Acute bronchitis, Anemia, Arthritis, Asthma, B12 deficiency (03/14/2017), Chest pain (07/02/2018), Chest pain, Chronic heart failure with preserved ejection fraction (HFpEF) ([place]), COPD (chronic obstructive pulmonary disease) ([place]) (07/24/2018), Family history of blood clots (07/16/2017), Irritable bowel syndrome, Malignant hypertension, Obesity, OSA (obstructive sleep apnea), Pneumonia (2017), Restless leg syndrome, and Stroke ([place]) (  1995).   Surgical History:   Past Surgical History:  Procedure Laterality Date   CHOLECYSTECTOMY     COLONOSCOPY WITH PROPOFOL N/A 03/31/2018   Procedure: COLONOSCOPY WITH PROPOFOL;  Surgeon: Lucilla Lame, MD;  Location: [facility] ENDOSCOPY;  Service: Endoscopy;  Laterality: N/A;   KNEE ARTHROPLASTY Right 04/30/2019   Procedure: COMPUTER ASSISTED TOTAL KNEE ARTHROPLASTY;  Surgeon: Dereck Leep, MD;  Location: [facility] ORS;  Service: Orthopedics;  Laterality: Right;   RIGHT HEART CATH N/A 01/30/2022   Procedure: RIGHT HEART CATH;  Surgeon: Nelva Bush, MD;  Location: [facility];  Service: Cardiovascular;  Laterality: N/A;   saliva gland removal  1998   TOTAL KNEE ARTHROPLASTY Left 2014   TUBAL LIGATION Bilateral      Social History:   reports that she has never smoked. She has never used smokeless tobacco. She reports that she does not drink alcohol and does not use drugs.   Family History:  Her family history includes COPD in her father; Coronary artery disease (age of onset: 68) in her brother; Dementia in her mother; Diabetes in her father; Heart failure in her brother and maternal grandmother; Stroke in her maternal grandmother. There is no history  of Breast cancer.   Allergies Allergies  Allergen Reactions   Nuvigil [Armodafinil] Hives   Penicillins Diarrhea and Nausea And Vomiting    Did it involve swelling of the face/tongue/throat, SOB, or low BP? no Did it involve sudden or severe rash/hives, skin peeling, or any reaction on the inside of your mouth or nose? No Did you need to seek medical attention at a hospital or doctor's office? No When did it last happen?  in her 68s    If all above answers are "NO", may proceed with cephalosporin use.    Spironolactone Itching   Gabapentin Other (See Comments)    "wiped her out, couldn't stay awake"   Melatonin Other (See Comments)    "Brain fog"   Provigil [Modafinil] Hives   Entresto [Sacubitril-Valsartan] Itching   Lisinopril Cough     Home Medications  Prior to Admission medications   Medication Sig Start Date End Date Taking? Authorizing Provider  acetaminophen (TYLENOL) 500 MG tablet Take 1,000 mg by mouth every 6 (six) hours as needed.   Yes [provider]  benzonatate (TESSALON) 100 MG capsule TAKE 1 CAPSULE BY MOUTH THREE TIMES A DAY AS NEEDED FOR COUGH 03/08/22  Yes Tyler Pita, MD  calcitRIOL (ROCALTROL) 0.25 MCG capsule Take 0.25 mcg by mouth daily. 02/22/22  Yes [provider]  dapagliflozin propanediol (FARXIGA) 10 MG TABS tablet Take 1 tablet (10 mg total) by mouth daily before breakfast. 03/07/22  Yes Hackney, Tina A, FNP  diltiazem (CARDIZEM CD) 240 MG 24 hr capsule Take 1 capsule (240 mg total) by mouth daily. 09/05/21  Yes Theora Gianotti, NP  ipratropium-albuterol (DUONEB) 0.5-2.5 (3) MG/3ML SOLN TAKE 3 MLS BY NEBULIZATION EVERY 6 (SIX) HOURS AS NEEDED. J45.40 03/04/22  Yes Tyler Pita, MD  metolazone (ZAROXOLYN) 2.5 MG tablet Take 1 tablet (2.5 mg total) by mouth 2 (two) times a week. Patient taking differently: Take 2.5 mg by mouth 2 (two) times a week. Monday and Friday 02/04/22  Yes Wouk, Ailene Rud, MD  ondansetron  (ZOFRAN) 4 MG tablet Take 1 tablet (4 mg total) by mouth daily as needed for nausea or vomiting. 05/10/22 05/10/23 Yes Lorella Nimrod, MD  pantoprazole (PROTONIX) 40 MG tablet TAKE 1 TABLET BY MOUTH EVERY DAY 12/11/21  Yes Glean Hess, MD  PREVALITE 4 g packet DISSOLVE AND DRINK 1 PACKET BY MOUTH AT BEDTIME 03/05/22  Yes Glean Hess, MD  rOPINIRole (REQUIP) 1 MG tablet Take 1 tablet (1 mg total) by mouth 3 (three) times daily. 02/13/22  Yes Glean Hess, MD  Torsemide 40 MG TABS Take 40 mg by mouth daily.   Yes [provider]  budesonide (PULMICORT) 0.5 MG/2ML nebulizer solution TAKE 2 ML (0.5 MG TOTAL) BY NEBULIZATION TWICE A DAY Patient not taking: Reported on 06/08/2022 10/29/21   Tyler Pita, MD  feeding supplement (ENSURE ENLIVE / ENSURE PLUS) LIQD Take 237 mLs by mouth 3 (three) times daily between meals. 05/10/22   Lorella Nimrod, MD  metoCLOPramide (REGLAN) 5 MG tablet Take 1 tablet (5 mg total) by mouth 3 (three) times daily before meals. Patient not taking: Reported on 06/08/2022 05/12/22   Sharen Hones, MD  NON FORMULARY Bipap - nightly with Oxygen 2 Lpm    [provider]  oxyCODONE 7.5 MG TABS Take 7.5 mg by mouth every 6 (six) hours as needed. 05/10/22 05/10/23  Lorella Nimrod, MD  OXYGEN Inhale into the lungs. 2 LPM [place]    [provider]  promethazine (PHENERGAN) 25 MG tablet Take 1 tablet (25 mg total) by mouth every 6 (six) hours as needed. Patient not taking: Reported on 06/08/2022 05/10/22 05/10/23  Lorella Nimrod, MD     Critical care time: 40 minutes    Darel Hong, AGACNP-BC [facility] Prefer epic messenger for cross cover needs If after hours, please call E-link

## 2022-06-15 NOTE — Progress Notes (Signed)
Nimbex titrated off at 1221. Patient now coughing against ETT and desated into low 80's. Darel Hong, NP at bedside and instructed RN to increase sedation. ETT inline suctioned. FIO2 was increased to 70 %. TOF 4/4 twitches. Patient son at bedside and updated by NP.

## 2022-06-15 NOTE — Progress Notes (Signed)
RT withdrew ET tube 1cm to 24cm at the lip, per Np/MD request.

## 2022-06-15 NOTE — Progress Notes (Signed)
RT assisted Samantha Hong NP, and Dr. Gwendalyn Ege with tube exchange due to persistent cuff leak. Pt tolerated well with no complications. Bougie attempted but tube was unable to be passed through cords. Glide scope and S4 blade used to re-intubate patient with no complications.

## 2022-06-15 NOTE — Procedures (Signed)
Intubation Procedure Note  Samantha Mason  [id number]  10-Apr-1951  Date:06/15/22  Time:8:46 AM   Provider Performing:Danaja Lasota D Dewaine Conger    Pt with persistent cuff leak which is affecting ventilation.  Requires ETT exchange.   Procedure: Intubation (31500)  Indication(s) Respiratory Failure  Consent Unable to obtain consent due to emergent nature of procedure.   Anesthesia Pt already sedated with fentanyl and propofol, paralyzed on Nimbex gtt.  Required no additional sedatives.   Time Out Verified patient identification, verified procedure, site/side was marked, verified correct patient position, special equipment/implants available, medications/allergies/relevant history reviewed, required imaging and test results available.   Sterile Technique Usual hand hygeine, masks, and gloves were used   Procedure Description Patient positioned in bed supine.  Sedation given as noted above.  Patient was intubated with endotracheal tube using Glidescope.  View was Grade 1 full glottis .  Number of attempts was 1.  Colorimetric CO2 detector was consistent with tracheal placement.  Attempted to exchange tube over bougie, however ETT would not pass.  Therefore pt was extubated and reintubated.  Complications/Tolerance None; patient tolerated the procedure well. Chest X-ray is ordered to verify placement.   EBL Minimal   Specimen(s) None   Samantha Mason, AGACNP-BC [place] [facility] Prefer epic messenger for cross cover needs If after hours, please call E-link

## 2022-06-16 ENCOUNTER — Inpatient Hospital Stay: Payer: Medicare Other

## 2022-06-16 DIAGNOSIS — A419 Sepsis, unspecified organism: Secondary | ICD-10-CM | POA: Diagnosis not present

## 2022-06-16 DIAGNOSIS — R6520 Severe sepsis without septic shock: Secondary | ICD-10-CM | POA: Diagnosis not present

## 2022-06-16 LAB — RENAL FUNCTION PANEL
Albumin: 2.5 g/dL — ABNORMAL LOW (ref 3.5–5.0)
Anion gap: 9 (ref 5–15)
BUN: 88 mg/dL — ABNORMAL HIGH (ref 8–23)
CO2: 32 mmol/L (ref 22–32)
Calcium: 8.8 mg/dL — ABNORMAL LOW (ref 8.9–10.3)
Chloride: 101 mmol/L (ref 98–111)
Creatinine, Ser: 1.62 mg/dL — ABNORMAL HIGH (ref 0.44–1.00)
GFR, Estimated: 34 mL/min — ABNORMAL LOW (ref >60)
Glucose, Bld: 192 mg/dL — ABNORMAL HIGH (ref 70–99)
Phosphorus: 2.6 mg/dL (ref 2.5–4.6)
Potassium: 4.2 mmol/L (ref 3.5–5.1)
Sodium: 142 mmol/L (ref 135–145)

## 2022-06-16 LAB — GLUCOSE, CAPILLARY
Glucose-Capillary: 152 mg/dL — ABNORMAL HIGH (ref 70–99)
Glucose-Capillary: 162 mg/dL — ABNORMAL HIGH (ref 70–99)
Glucose-Capillary: 170 mg/dL — ABNORMAL HIGH (ref 70–99)
Glucose-Capillary: 191 mg/dL — ABNORMAL HIGH (ref 70–99)
Glucose-Capillary: 207 mg/dL — ABNORMAL HIGH (ref 70–99)

## 2022-06-16 LAB — CBC
HCT: 30.8 % — ABNORMAL LOW (ref 36.0–46.0)
Hemoglobin: 9 g/dL — ABNORMAL LOW (ref 12.0–15.0)
MCH: 23.8 pg — ABNORMAL LOW (ref 26.0–34.0)
MCHC: 29.2 g/dL — ABNORMAL LOW (ref 30.0–36.0)
MCV: 81.5 fL (ref 80.0–100.0)
Platelets: 271 10*3/uL (ref 150–400)
RBC: 3.78 MIL/uL — ABNORMAL LOW (ref 3.87–5.11)
RDW: 18.4 % — ABNORMAL HIGH (ref 11.5–15.5)
WBC: 25.1 10*3/uL — ABNORMAL HIGH (ref 4.0–10.5)
nRBC: 0.2 % (ref 0.0–0.2)

## 2022-06-16 LAB — PROCALCITONIN: Procalcitonin: 0.27 ng/mL

## 2022-06-16 MED ORDER — INSULIN ASPART 100 UNIT/ML IJ SOLN
0.0000 [IU] | INTRAMUSCULAR | Status: DC
  Administered 2022-06-16: 7 [IU] via SUBCUTANEOUS
  Administered 2022-06-16 (×4): 4 [IU] via SUBCUTANEOUS
  Administered 2022-06-17: 3 [IU] via SUBCUTANEOUS
  Administered 2022-06-17 (×2): 4 [IU] via SUBCUTANEOUS
  Administered 2022-06-17 (×2): 3 [IU] via SUBCUTANEOUS
  Administered 2022-06-17: 4 [IU] via SUBCUTANEOUS
  Administered 2022-06-18 – 2022-06-20 (×8): 3 [IU] via SUBCUTANEOUS
  Administered 2022-06-23: 4 [IU] via SUBCUTANEOUS
  Filled 2022-06-16 (×20): qty 1

## 2022-06-16 NOTE — Progress Notes (Signed)
Pt's daughter and son updated at bedside.  All questions answered.    Darel Hong, AGACNP-BC [place] [facility] Prefer epic messenger for cross cover needs If after hours, please call E-link

## 2022-06-16 NOTE — Progress Notes (Signed)
NAME:  Samantha Mason, MRN:  [id number], DOB:  09-Jan-1951, LOS: 8 ADMISSION DATE:  06/08/2022, CONSULTATION DATE:  06/11/2022 REFERRING MD:  Dr. Manuella Ghazi, CHIEF COMPLAINT:  Altered Mental Status, Hypercapnia  Brief Pt Description / Synopsis:  72 y.o. female with PMHx significant for HFpEF, COPD, OSA (noncompliant with CPAP), CKD Stage III who is  admitted with Sepsis in setting of lower extremity Cellulitis, AKI, and  Acute on Chronic Hypercapnic Respiratory Failure in setting AECOPD.  Course complicated by CO2 narcosis, failed BiPAP requiring intubation and mechanical ventilation.  History of Present Illness:  Samantha Mason is a 72 y.o. female with medical history significant for Morbid obesity, chronic HFpEF,CKD stage III, chronic lower extremity lymphedema on treatment with torsemide, hypertension, asthma, stroke (1995) and obstructive sleep apnea(non compliant with cpap) who presented to [facility] ED on 06/08/22 due to shortness of breath and generalized malaise for appropriately 1 week.  Pt is currently altered and no family currently available, therefore history is obtained from chart review.  Per ED and nursing notes, she reported about a 1 week history of feeling weak and fatigued, chills, bilateral lower extremity swelling and erythema (R>L), paroxsymal nocturnal dyspnea and orthopnea, weight gain with increased oxygen requirements.  She denied abdominal pain, N/V/D, dysuria.  ED Course: Initial Vital Signs: 105F orally, BP 129/53, HR 109, RR 27, SpO2 96% Significant Labs: BUN 39, Creatinine 1.59, Albumin 2.4, lactic acid 1.1, WBC 12.9, Hgb 8.0, Hct 27.7, Hgb A1c 5.8 COVID/FLU/RSV negative UA negative for UTI Imaging Chest X-ray>>IMPRESSION: Mild cardiomegaly with central pulmonary vascular congestion. CT Chest/Abdomen/Pelvis>> IMPRESSION: 1. Skin thickening and soft tissue edema of the partially imaged proximal right thigh. Correlate for cellulitis. 2. Newly enlarged bilateral inguinal  and iliac lymph nodes, likely reactive. 3. Diffuse bilateral bronchial wall thickening and mosaic attenuation of the airspaces, consistent with nonspecific infectious or inflammatory bronchitis. 4. Cardiomegaly.  She met Sepsis criteria, therefore she received IV fluids and antibiotics. Hospitalists were asked to admit for further workup and treatment os sepsis due to cellulitis.  Please see "significant hospital events" section below for full detailed hospital course.  Pertinent  Medical History   Past Medical History:  Diagnosis Date   Acute bronchitis    Anemia    Arthritis    Asthma    B12 deficiency 03/14/2017   Chest pain 07/02/2018   Chest pain    a. 06/2018 MV: EF 66%. No ischemia/infarct; b. 02/2021 PET CT: EF>60%, no ischemia/infarct.   Chronic heart failure with preserved ejection fraction (HFpEF) ([place])    a. 08/2020 Echo: EF 55-60%, GrI DD; b. 06/2021 Echo: EF 55-60%, mild LVH, nl RV fxn, no signif valvular dzs.   COPD (chronic obstructive pulmonary disease) ([place]) 07/24/2018   Family history of blood clots 07/16/2017   Irritable bowel syndrome    Malignant hypertension    Obesity    OSA (obstructive sleep apnea)    uses cpap   Pneumonia 2017   Restless leg syndrome    Stroke ([place]) 1995   mild     Micro Data:  1/20: SARS-CoV-2/influenza/RSV PCR>> negative 1/20: Blood culture x 2>> no growth  1/20: Urine>> negative 1/22: MRSA PCR>>negative  1/24: Respiratory viral panel>>negative  1/26: Tracheal aspirate>> not collected   Antimicrobials:   Anti-infectives (From admission, onward)    Start     Dose/Rate Route Frequency Ordered Stop   06/14/22 1400  ceFEPIme (MAXIPIME) 2 g in sodium chloride 0.9 % 100 mL IVPB  2 g 200 mL/hr over 30 Minutes Intravenous Every 12 hours 06/14/22 1301     06/13/22 1000  ceFEPIme (MAXIPIME) 2 g in sodium chloride 0.9 % 100 mL IVPB  Status:  Discontinued        2 g 200 mL/hr over 30 Minutes Intravenous Every 24 hours  06/12/22 1430 06/13/22 0747   06/13/22 0900  cefTRIAXone (ROCEPHIN) 2 g in sodium chloride 0.9 % 100 mL IVPB  Status:  Discontinued        2 g 200 mL/hr over 30 Minutes Intravenous Every 24 hours 06/13/22 0747 06/14/22 1230   06/11/22 1145  vancomycin (VANCOREADY) IVPB 2000 mg/400 mL        2,000 mg 200 mL/hr over 120 Minutes Intravenous  Once 06/11/22 1047 06/11/22 1344   06/11/22 1145  ceFEPIme (MAXIPIME) 2 g in sodium chloride 0.9 % 100 mL IVPB  Status:  Discontinued        2 g 200 mL/hr over 30 Minutes Intravenous 2 times daily 06/11/22 1047 06/12/22 1430   06/10/22 2300  vancomycin (VANCOCIN) IVPB 1000 mg/200 mL premix  Status:  Discontinued       See Hyperspace for full Linked Orders Report.   1,000 mg 200 mL/hr over 60 Minutes Intravenous Every 48 hours 06/08/22 2307 06/09/22 0922   06/10/22 2300  vancomycin (VANCOREADY) IVPB 1250 mg/250 mL  Status:  Discontinued       See Hyperspace for full Linked Orders Report.   1,250 mg 166.7 mL/hr over 90 Minutes Intravenous Every 48 hours 06/08/22 2307 06/09/22 0922   06/10/22 1800  cephALEXin (KEFLEX) capsule 500 mg  Status:  Discontinued        500 mg Oral Every 6 hours 06/10/22 1521 06/11/22 1047   06/09/22 2200  vancomycin (VANCOREADY) IVPB 750 mg/150 mL  Status:  Discontinued        750 mg 150 mL/hr over 60 Minutes Intravenous Every 24 hours 06/09/22 0923 06/10/22 1521   06/09/22 1000  ceFEPIme (MAXIPIME) 2 g in sodium chloride 0.9 % 100 mL IVPB  Status:  Discontinued        2 g 200 mL/hr over 30 Minutes Intravenous Every 12 hours 06/09/22 0916 06/10/22 1521   06/08/22 2300  vancomycin (VANCOCIN) IVPB 1000 mg/200 mL premix  Status:  Discontinued        1,000 mg 200 mL/hr over 60 Minutes Intravenous  Once 06/08/22 2257 06/08/22 2307   06/08/22 2215  vancomycin (VANCOCIN) IVPB 1000 mg/200 mL premix       See Hyperspace for full Linked Orders Report.   1,000 mg 200 mL/hr over 60 Minutes Intravenous  Once 06/08/22 2210 06/09/22 0014    06/08/22 2215  vancomycin (VANCOREADY) IVPB 1500 mg/300 mL       See Hyperspace for full Linked Orders Report.   1,500 mg 150 mL/hr over 120 Minutes Intravenous  Once 06/08/22 2210 06/10/22 0807   06/08/22 1915  piperacillin-tazobactam (ZOSYN) IVPB 3.375 g        3.375 g 100 mL/hr over 30 Minutes Intravenous  Once 06/08/22 1909 06/08/22 [place] [facility] Events: Including procedures, antibiotic start and stop dates in addition to other pertinent events   1/20: Presented to ED, admitted by [facility] 1/21: Added cefepime, continue vancomycin, increase dose of Cardizem for better heart rate control 1/22: Had to reduce Cardizem dose back to her home dose as blood pressure dropped 1/23: Transferred to ICU overnight due to CO2 narcosis, placed  on BiPAP.  PCCM consulted.  Worsening AKI, holding diuresis.  Agitation requiring Precedex, high risk for Intubation.  Palliative Care consulted for [place] conversations.  ABX broadened back to Cefepime and Vancomycin 1/24: Pt on Bipap but awake and able to follow commands.  Will attempt to transition off Bipap.  Palliative Care following along to assist with goals of care conversations  1/25: Pt currently on Bipap and precedex gtt.  Required 2.5 mg iv valium overnight for delirium.  Unable to obtain CT Head due to respiratory status.  Ultimately required intubation. 1/26: Remains critically ill. Issues with vent dyschrony and ventilation, plan to start Nimbex gtt. Creatinine improved with diuresis, continue with diuresis, Nephrology considering Lasix gtt. 1/27: ABG's improved on Nimbex, persistent cuff leak, exchanged ETT. Decadron started due to upper airway swelling. Will attempt to d/c Nimbex. Good diuresis with Lasix gtt with 4.5 L of UOP last 24 hrs, Creatinine improving. 1/28: Continues with good diuresis on Lasix gtt, 3L of UOP over preceding 24 hrs, creatinine continues to improve.  Vent requirements slowly improving.    Interim History /  Subjective:  -No significant events noted overnight -Afebrile, hemodynamically stable -Vent requirements slowly improving (50% FiO2, 10 PEEP) ~ not ready for extubation today, but per discussion with Dr. Gwendalyn Ege, will exercise in PSV with high support as tolerated to exercise respiratory muscles  -Good diuresis with Lasix gtt, UOP 3L preceding 24 hrs (net +2.5 L), Creatinine improved to 1.62 from 1.87 ~ Nephrology following -Increase in WBC to 25 from 14 ~ suspect due to decadron that was started yesterday due to upper airway swelling -Pt is more awake today, eyes open and tracking; very weak and unable to follow commands currently  Objective   Blood pressure (!) 157/54, pulse 66, temperature (!) 97.2 F (36.2 C), resp. rate 20, height '5\' 4"'$  (1.626 m), weight (!) 151.2 kg, SpO2 96 %.    Vent Mode: PRVC FiO2 (%):  [50 %-80 %] 60 % Set Rate:  [20 bmp] 20 bmp Vt Set:  [450 mL] 450 mL PEEP:  [8 cmH20] 8 cmH20 Plateau Pressure:  [22 cmH20-25 cmH20] 25 cmH20   Intake/Output Summary (Last 24 hours) at 06/16/2022 0724 Last data filed at 06/16/2022 0700 Gross per 24 hour  Intake 3475.46 ml  Output 3075 ml  Net 400.46 ml    Filed Weights   06/14/22 0223 06/15/22 0500 06/16/22 0353  Weight: (!) 159.3 kg (!) 151.4 kg (!) 151.2 kg    Examination: General: Acute on chronically ill-appearing obese female, laying in bed, intubated and sedated, in no acute distress HENT: Supple, difficult to assess JVD due to body habitus  Lungs: Diminished distant breath sounds throughout, even, non labored, occasionally overbreathes the vent  Cardiovascular: NSR, s1s2, no m/r/g, 2+ radial/1+ distal pulses, no edema  Abdomen: +BS x4, obese, mild distension, mild tenderness present   Extremities: All extremities edematous, bilateral LE lymphedema with unna boots in place Neuro: Sedated, opens eyes and tracks, purposeful movements (reaches for ETT) but currently not following commands, pupils PERRL  GU: Indwelling  foley catheter draining clear yellow urine  Skin: Cellulitis to bilateral lower extremities, yeast excoriation to abdominal fold/groins  Resolved Hospital Problem list     Assessment & Plan:   Acute on Chronic Hypercapnic Respiratory Failure in setting of AECOPD & acute CHF exacerbation vs ARDS PMHx: OSA (noncompliant with CPAP at home), morbid obesity with probable OHS -Full vent support, implement lung protective strategies -Plateau pressures less than 30 cm H20 -Wean  FiO2 & PEEP as tolerated to maintain O2 sats 88 to 92% -Follow intermittent Chest X-ray & ABG as needed -Spontaneous Breathing Trials when respiratory parameters met and mental status permits -Implement VAP Bundle -Bronchodilators & Pulmicort nebs -Continue Decadron given upper airway edema noted on ETT exchange 1/27 - ABX as above - Diuresis as BP and renal function permits - Pulmonary toilet as able  Acute on Chronic HFpEF Hypotension PMHx: HTN Echocardiogram 07/16/21: LVEF 55-60%, indeterminate diastolic parameters, RV systolic function normal -Continuous cardiac monitoring -Maintain MAP >65 -Vasopressors as needed to maintain MAP goal -Diuresis as BP and renal function permits ~ Continue Lasix gtt as per Nephrology -Nephrology following, appreciate input -Repeat Echocardiogram performed on 06/12/22 ~ report results still pending  Sepsis secondary to Cellulitis of Lower extremities (Meets SIRS Criteria: HR >90, RR >20, fever) -Monitor fever curve -Trend WBC's & Procalcitonin -Follow cultures as above -Continue empiric Cefepime and Vancomycin pending cultures & sensitivities  Acute Kidney Injury on CKD III ~ Improving  - Trend BMP  - Replace electrolytes as indicated  - Monitor UOP - Avoid nephrotoxic medication as able  - Nephrology following, appreciate input   Anemia of Chronic Disease - Trend CBC - Monitor for s/sx of bleeding  - Transfuse for hgb <7  Acute Metabolic Encephalopathy in the  setting of CO2 Narcosis and multiple metabolic derangements Sedation needs in setting of Mechanical Ventilation CT Head 1/25 negative for acute intracranial abnormality EEG 1/26 negative for seizures -Maintain a RASS goal of 0 to -1  -Fentanyl , Propofol, and Precedex as neeed to maintain RASS goal -Avoid sedating medications as able -Daily wake up assessment    Pt is critically ill with multiorgan failure superimposed on multiple chronic co morbidities and morbid obesity.  High risk for further decompensation, cardiac arrest and death.  Anticipate she will be difficult to liberate from the ventilator, and may likely need Tracheotomy. She is DNR status, and family at bedside reports that pt WOULD NOT WANT a TRACHEOSTOMY, and they do not wish to pursue trach or feeding tube.  They wish to give her the 14 days of aggressive treatment and intubation and assess her response.  Palliative Care following to assist with goals of care.  Best Practice (right click and "Reselect all SmartList Selections" daily)   Diet/type: NPO, tube feeds DVT prophylaxis: prophylactic heparin  GI prophylaxis: PPI Lines: Left IJ, and is still needed, left radial A-line and is still needed Foley:  foley catheter, and is still needed Code Status:  DNR Last date of multidisciplinary goals of care discussion [06/15/22]  1/28: Will update pt's family when they arrive at bedside.  Labs   CBC: Recent Labs  Lab 06/12/22 0736 06/13/22 0421 06/14/22 0404 06/15/22 0404 06/16/22 0400  WBC 6.1 8.6 15.8*  16.1* 14.0* 25.1*  NEUTROABS  --   --  13.4*  --   --   HGB 9.2* 9.7* 8.9*  9.0* 8.7* 9.0*  HCT 31.5* 32.6* 30.7*  30.3* 28.5* 30.8*  MCV 81.8 79.9* 81.9  81.9 79.2* 81.5  PLT 159 247 310  319 220 271     Basic Metabolic Panel: Recent Labs  Lab 06/12/22 0736 06/13/22 0421 06/14/22 0404 06/15/22 0404 06/16/22 0400  NA 139 139 139 140 142  K 4.8 4.6 3.5 4.1 4.2  CL 100 102 101 101 101  CO2 '22 25 26 29  '$ 32  GLUCOSE 155* 157* 144* 194* 192*  BUN 70* 75* 70* 77* 88*  CREATININE 2.85* 2.49*  2.06* 1.87* 1.62*  CALCIUM 8.2* 8.5* 8.9 8.5* 8.8*  MG 1.7 2.2 2.1 1.9  --   PHOS 6.6* 5.4* 3.6 1.9* 2.6    GFR: Estimated Creatinine Clearance: 47.6 mL/min (A) (by C-G formula based on SCr of 1.62 mg/dL (H)). Recent Labs  Lab 06/13/22 0421 06/14/22 0404 06/15/22 0404 06/16/22 0400  PROCALCITON  --  0.28 0.37 0.27  WBC 8.6 15.8*  16.1* 14.0* 25.1*     Liver Function Tests: Recent Labs  Lab 06/12/22 0736 06/13/22 0421 06/14/22 0404 06/15/22 0404 06/16/22 0400  ALBUMIN 2.5* 2.6* 2.5* 2.4* 2.5*    No results for input(s): "LIPASE", "AMYLASE" in the last 168 hours. Recent Labs  Lab 06/13/22 0421  AMMONIA 20     ABG    Component Value Date/Time   PHART 7.46 (H) 06/15/2022 1500   PCO2ART 52 (H) 06/15/2022 1500   PO2ART 134 (H) 06/15/2022 1500   HCO3 37.0 (H) 06/15/2022 1500   ACIDBASEDEF 1.4 06/12/2022 0736   O2SAT 99.8 06/15/2022 1500     Coagulation Profile: No results for input(s): "INR", "PROTIME" in the last 168 hours.   Cardiac Enzymes: No results for input(s): "CKTOTAL", "CKMB", "CKMBINDEX", "TROPONINI" in the last 168 hours.  HbA1C: Hgb A1c MFr Bld  Date/Time Value Ref Range Status  06/08/2022 07:16 AM 5.8 (H) 4.8 - 5.6 % Final    Comment:    (NOTE) Pre diabetes:          5.7%-6.4%  Diabetes:              >6.4%  Glycemic control for   <7.0% adults with diabetes   07/15/2021 04:30 PM 5.4 4.8 - 5.6 % Final    Comment:    (NOTE) Pre diabetes:          5.7%-6.4%  Diabetes:              >6.4%  Glycemic control for   <7.0% adults with diabetes     CBG: Recent Labs  Lab 06/15/22 0339 06/15/22 0732 06/15/22 1128 06/15/22 1535 06/15/22 2019  GLUCAP 146* 177* 200* 202* 189*     Review of Systems:   Unable to assess due to AMS/intubation/sedation  Past Medical History:  She,  has a past medical history of Acute bronchitis, Anemia, Arthritis,  Asthma, B12 deficiency (03/14/2017), Chest pain (07/02/2018), Chest pain, Chronic heart failure with preserved ejection fraction (HFpEF) ([place]), COPD (chronic obstructive pulmonary disease) ([place]) (07/24/2018), Family history of blood clots (07/16/2017), Irritable bowel syndrome, Malignant hypertension, Obesity, OSA (obstructive sleep apnea), Pneumonia (2017), Restless leg syndrome, and Stroke ([place]) (1995).   Surgical History:   Past Surgical History:  Procedure Laterality Date   CHOLECYSTECTOMY     COLONOSCOPY WITH PROPOFOL N/A 03/31/2018   Procedure: COLONOSCOPY WITH PROPOFOL;  Surgeon: Lucilla Lame, MD;  Location: [facility] ENDOSCOPY;  Service: Endoscopy;  Laterality: N/A;   KNEE ARTHROPLASTY Right 04/30/2019   Procedure: COMPUTER ASSISTED TOTAL KNEE ARTHROPLASTY;  Surgeon: Dereck Leep, MD;  Location: [facility] ORS;  Service: Orthopedics;  Laterality: Right;   RIGHT HEART CATH N/A 01/30/2022   Procedure: RIGHT HEART CATH;  Surgeon: Nelva Bush, MD;  Location: [place] CV LAB;  Service: Cardiovascular;  Laterality: N/A;   saliva gland removal  1998   TOTAL KNEE ARTHROPLASTY Left 2014   TUBAL LIGATION Bilateral      Social History:   reports that she has never smoked. She has never used smokeless tobacco. She reports that she does not drink alcohol and  does not use drugs.   Family History:  Her family history includes COPD in her father; Coronary artery disease (age of onset: 79) in her brother; Dementia in her mother; Diabetes in her father; Heart failure in her brother and maternal grandmother; Stroke in her maternal grandmother. There is no history of Breast cancer.   Allergies Allergies  Allergen Reactions   Nuvigil [Armodafinil] Hives   Penicillins Diarrhea and Nausea And Vomiting    Did it involve swelling of the face/tongue/throat, SOB, or low BP? no Did it involve sudden or severe rash/hives, skin peeling, or any reaction on the inside of your mouth or nose? No Did you need  to seek medical attention at a hospital or doctor's office? No When did it last happen?  in her 65s    If all above answers are "NO", may proceed with cephalosporin use.    Spironolactone Itching   Gabapentin Other (See Comments)    "wiped her out, couldn't stay awake"   Melatonin Other (See Comments)    "Brain fog"   Provigil [Modafinil] Hives   Entresto [Sacubitril-Valsartan] Itching   Lisinopril Cough     Home Medications  Prior to Admission medications   Medication Sig Start Date End Date Taking? Authorizing Provider  acetaminophen (TYLENOL) 500 MG tablet Take 1,000 mg by mouth every 6 (six) hours as needed.   Yes [provider]  benzonatate (TESSALON) 100 MG capsule TAKE 1 CAPSULE BY MOUTH THREE TIMES A DAY AS NEEDED FOR COUGH 03/08/22  Yes Tyler Pita, MD  calcitRIOL (ROCALTROL) 0.25 MCG capsule Take 0.25 mcg by mouth daily. 02/22/22  Yes [provider]  dapagliflozin propanediol (FARXIGA) 10 MG TABS tablet Take 1 tablet (10 mg total) by mouth daily before breakfast. 03/07/22  Yes Hackney, Tina A, FNP  diltiazem (CARDIZEM CD) 240 MG 24 hr capsule Take 1 capsule (240 mg total) by mouth daily. 09/05/21  Yes Theora Gianotti, NP  ipratropium-albuterol (DUONEB) 0.5-2.5 (3) MG/3ML SOLN TAKE 3 MLS BY NEBULIZATION EVERY 6 (SIX) HOURS AS NEEDED. J45.40 03/04/22  Yes Tyler Pita, MD  metolazone (ZAROXOLYN) 2.5 MG tablet Take 1 tablet (2.5 mg total) by mouth 2 (two) times a week. Patient taking differently: Take 2.5 mg by mouth 2 (two) times a week. Monday and Friday 02/04/22  Yes Wouk, Ailene Rud, MD  ondansetron (ZOFRAN) 4 MG tablet Take 1 tablet (4 mg total) by mouth daily as needed for nausea or vomiting. 05/10/22 05/10/23 Yes Lorella Nimrod, MD  pantoprazole (PROTONIX) 40 MG tablet TAKE 1 TABLET BY MOUTH EVERY DAY 12/11/21  Yes Glean Hess, MD  PREVALITE 4 g packet DISSOLVE AND DRINK 1 PACKET BY MOUTH AT BEDTIME 03/05/22  Yes Glean Hess, MD  rOPINIRole (REQUIP) 1 MG tablet Take 1 tablet (1 mg total) by mouth 3 (three) times daily. 02/13/22  Yes Glean Hess, MD  Torsemide 40 MG TABS Take 40 mg by mouth daily.   Yes [provider]  budesonide (PULMICORT) 0.5 MG/2ML nebulizer solution TAKE 2 ML (0.5 MG TOTAL) BY NEBULIZATION TWICE A DAY Patient not taking: Reported on 06/08/2022 10/29/21   Tyler Pita, MD  feeding supplement (ENSURE ENLIVE / ENSURE PLUS) LIQD Take 237 mLs by mouth 3 (three) times daily between meals. 05/10/22   Lorella Nimrod, MD  metoCLOPramide (REGLAN) 5 MG tablet Take 1 tablet (5 mg total) by mouth 3 (three) times daily before meals. Patient not taking: Reported on 06/08/2022 05/12/22   [place],  Dekui, MD  NON FORMULARY Bipap - nightly with Oxygen 2 Lpm    [provider]  oxyCODONE 7.5 MG TABS Take 7.5 mg by mouth every 6 (six) hours as needed. 05/10/22 05/10/23  Lorella Nimrod, MD  OXYGEN Inhale into the lungs. 2 LPM [place]    [provider]  promethazine (PHENERGAN) 25 MG tablet Take 1 tablet (25 mg total) by mouth every 6 (six) hours as needed. Patient not taking: Reported on 06/08/2022 05/10/22 05/10/23  Lorella Nimrod, MD     Critical care time: 40 minutes    Darel Hong, AGACNP-BC [facility] Prefer epic messenger for cross cover needs If after hours, please call E-link

## 2022-06-16 NOTE — Progress Notes (Signed)
Patient placed in PSV/Spontaneous by MD to let her exercise respiratory muscles as tolerated this morning.  Tol well at this time.

## 2022-06-16 NOTE — Consult Note (Signed)
[facility] for Electrolyte Monitoring and Replacement   Recent Labs: Potassium (mmol/L)  Date Value  06/16/2022 4.2  05/25/2014 3.6   Magnesium (mg/dL)  Date Value  06/15/2022 1.9   Calcium (mg/dL)  Date Value  06/16/2022 8.8 (L)   Calcium, Total (mg/dL)  Date Value  05/25/2014 8.6   Albumin (g/dL)  Date Value  06/16/2022 2.5 (L)  05/25/2014 3.2 (L)   Phosphorus (mg/dL)  Date Value  06/16/2022 2.6   Sodium (mmol/L)  Date Value  06/16/2022 142  02/13/2022 135  05/25/2014 141    Assessment: 72 y.o. female with PMHx significant for HFpEF, COPD, OSA (noncompliant with CPAP), CKD Stage III who is  admitted with Sepsis in setting of lower extremity Cellulitis, AKI, and  Acute on Chronic Hypercapnic Respiratory Failure in setting AECOPD.  Course complicated by CO2 narcosis, failed BiPAP requiring intubation and mechanical ventilation. Pharmacy has been consulted to monitor and replace electrolytes while PCCM care.  Diuretics: furosemide infusion at 4 mg/hr  Nutrition: Vital at 60 mL/hr + FWF 30 mL q4h  Goal of Therapy:  Electrolytes WNL  Plan:  --no electrolyte replacement warranted for today - Will recheck electrolytes with AM labs  Dallie Piles ,PharmD Clinical Pharmacist 06/16/2022 9:31 AM

## 2022-06-16 NOTE — Progress Notes (Signed)
[facility]  ROUNDING NOTE   Subjective:   Samantha Mason  is a 72 year old female with past medical history including COPD with renal failure requiring 2 L oxygen as needed, chronic diastolic heart failure, sleep apnea, morbid obesity, hypertension, and chronic kidney disease stage IV. Patient presents to the emergency department feeling sick for a few days.  She was admitted for Sepsis [facility]) [A41.9] Cellulitis, unspecified cellulitis site [L03.90] Community acquired pneumonia, unspecified laterality [J18.9] Sepsis, due to unspecified organism, unspecified whether acute organ dysfunction present [facility]) [A41.9]  Patient is followed by our office outpatient. She was last seen in office on Apr 17, 2022.   Patient remains critically ill at this time. She remains intubated, sedated, and on Lasix drip. Urine output 3 L over the preceding 24 hours.  01/27 0701 - 01/28 0700 In: 3475.5 [I.V.:1350.3; NG/GT:1670; IV Piggyback:455.1] Out: 3075 [Urine:3075] Lab Results  Component Value Date   CREATININE 1.62 (H) 06/16/2022   CREATININE 1.87 (H) 06/15/2022   CREATININE 2.06 (H) 06/14/2022      Objective:  Vital signs in last 24 hours:  Temp:  [96.8 F (36 C)-98.6 F (37 C)] 97.7 F (36.5 C) (01/28 1130) Pulse Rate:  [66-110] 75 (01/28 1130) Resp:  [13-29] 26 (01/28 1130) BP: (124-171)/(42-69) 125/49 (01/28 1100) SpO2:  [90 %-100 %] 94 % (01/28 1130) Arterial Line BP: (92-177)/(39-86) 128/46 (01/28 1130) FiO2 (%):  [40 %-80 %] 40 % (01/28 1109) Weight:  [151.2 kg] 151.2 kg (01/28 0353)  Weight change: -0.2 kg Filed Weights   06/14/22 0223 06/15/22 0500 06/16/22 0353  Weight: (!) 159.3 kg (!) 151.4 kg (!) 151.2 kg    Intake/Output: I/O last 3 completed shifts: In: 4809.5 [I.V.:1739.5; NG/GT:2515; IV Piggyback:555] Out: 8502 [Urine:4915]   Intake/Output this shift:  Total I/O In: 496.1 [I.V.:16.1; NG/GT:480] Out: 475 [Urine:475]  Physical Exam: General:  Critically ill-appearing  Head: Normocephalic, atraumatic.  Endotracheal tube in place  Eyes: Anicteric  Lungs:  Intubated on vent, coarse breath sounds bilateral  Heart: Regular rate and rhythm  Abdomen:  Soft, nontender  Extremities: 2+ peripheral edema.  Neurologic: Intubated, sedated  Skin: No lesions  Access: No hemodialysis access    Basic Metabolic Panel: Recent Labs  Lab 06/12/22 0736 06/13/22 0421 06/14/22 0404 06/15/22 0404 06/16/22 0400  NA 139 139 139 140 142  K 4.8 4.6 3.5 4.1 4.2  CL 100 102 101 101 101  CO2 '22 25 26 29 '$ 32  GLUCOSE 155* 157* 144* 194* 192*  BUN 70* 75* 70* 77* 88*  CREATININE 2.85* 2.49* 2.06* 1.87* 1.62*  CALCIUM 8.2* 8.5* 8.9 8.5* 8.8*  MG 1.7 2.2 2.1 1.9  --   PHOS 6.6* 5.4* 3.6 1.9* 2.6     Liver Function Tests: Recent Labs  Lab 06/12/22 0736 06/13/22 0421 06/14/22 0404 06/15/22 0404 06/16/22 0400  ALBUMIN 2.5* 2.6* 2.5* 2.4* 2.5*    No results for input(s): "LIPASE", "AMYLASE" in the last 168 hours. Recent Labs  Lab 06/13/22 0421  AMMONIA 20     CBC: Recent Labs  Lab 06/12/22 0736 06/13/22 0421 06/14/22 0404 06/15/22 0404 06/16/22 0400  WBC 6.1 8.6 15.8*  16.1* 14.0* 25.1*  NEUTROABS  --   --  13.4*  --   --   HGB 9.2* 9.7* 8.9*  9.0* 8.7* 9.0*  HCT 31.5* 32.6* 30.7*  30.3* 28.5* 30.8*  MCV 81.8 79.9* 81.9  81.9 79.2* 81.5  PLT 159 247 310  319 220 271  Cardiac Enzymes: No results for input(s): "CKTOTAL", "CKMB", "CKMBINDEX", "TROPONINI" in the last 168 hours.  BNP: Invalid input(s): "POCBNP"  CBG: Recent Labs  Lab 06/15/22 0732 06/15/22 1128 06/15/22 1535 06/15/22 2019 06/16/22 0732  GLUCAP 177* 200* 202* 189* 207*     Microbiology: Results for orders placed or performed during the hospital encounter of 06/08/22  Blood Culture (routine x 2)     Status: None   Collection Time: 06/08/22  7:18 PM   Specimen: BLOOD  Result Value Ref Range Status   Specimen Description BLOOD BLOOD LEFT  ARM  Final   Special Requests   Final    BOTTLES DRAWN AEROBIC AND ANAEROBIC Blood Culture adequate volume   Culture   Final    NO GROWTH 5 DAYS Performed at [facility], [address] [place], [place] [address]    Report Status 06/13/2022 FINAL  Final  Blood Culture (routine x 2)     Status: None   Collection Time: 06/08/22  7:18 PM   Specimen: BLOOD  Result Value Ref Range Status   Specimen Description BLOOD BLOOD RIGHT ARM  Final   Special Requests   Final    BOTTLES DRAWN AEROBIC AND ANAEROBIC Blood Culture adequate volume   Culture   Final    NO GROWTH 5 DAYS Performed at [facility], [address] [place], [place] [address]    Report Status 06/13/2022 FINAL  Final  Resp panel by RT-PCR (RSV, Flu A&B, Covid) Anterior Nasal Swab     Status: None   Collection Time: 06/08/22  7:18 PM   Specimen: Anterior Nasal Swab  Result Value Ref Range Status   SARS Coronavirus 2 by RT PCR NEGATIVE NEGATIVE Final    Comment: (NOTE) SARS-CoV-2 target nucleic acids are NOT DETECTED.  The SARS-CoV-2 RNA is generally detectable in upper respiratory specimens during the acute phase of infection. The lowest concentration of SARS-CoV-2 viral copies this assay can detect is 138 copies/mL. A negative result does not preclude SARS-Cov-2 infection and should not be used as the sole basis for treatment or other patient management decisions. A negative result may occur with  improper specimen collection/handling, submission of specimen other than nasopharyngeal swab, presence of viral mutation(s) within the areas targeted by this assay, and inadequate number of viral copies(<138 copies/mL). A negative result must be combined with clinical observations, patient history, and epidemiological information. The expected result is Negative.  Fact Sheet for Patients:  EntrepreneurPulse.com.au  Fact Sheet for Healthcare Providers:   IncredibleEmployment.be  This test is no t yet approved or cleared by the [place] FDA and  has been authorized for detection and/or diagnosis of SARS-CoV-2 by FDA under an Emergency Use Authorization (EUA). This EUA will remain  in effect (meaning this test can be used) for the duration of the COVID-19 declaration under Section 564(b)(1) of the Act, 21 U.S.C.section 360bbb-3(b)(1), unless the authorization is terminated  or revoked sooner.       Influenza A by PCR NEGATIVE NEGATIVE Final   Influenza B by PCR NEGATIVE NEGATIVE Final    Comment: (NOTE) The Xpert Xpress SARS-CoV-2/FLU/RSV plus assay is intended as an aid in the diagnosis of influenza from Nasopharyngeal swab specimens and should not be used as a sole basis for treatment. Nasal washings and aspirates are unacceptable for Xpert Xpress SARS-CoV-2/FLU/RSV testing.  Fact Sheet for Patients: EntrepreneurPulse.com.au  Fact Sheet for Healthcare Providers: IncredibleEmployment.be  This test is not yet approved or cleared by the [place]  FDA and has been authorized for detection and/or diagnosis of SARS-CoV-2 by FDA under an Emergency Use Authorization (EUA). This EUA will remain in effect (meaning this test can be used) for the duration of the COVID-19 declaration under Section 564(b)(1) of the Act, 21 U.S.C. section 360bbb-3(b)(1), unless the authorization is terminated or revoked.     Resp Syncytial Virus by PCR NEGATIVE NEGATIVE Final    Comment: (NOTE) Fact Sheet for Patients: EntrepreneurPulse.com.au  Fact Sheet for Healthcare Providers: IncredibleEmployment.be  This test is not yet approved or cleared by the [place] FDA and has been authorized for detection and/or diagnosis of SARS-CoV-2 by FDA under an Emergency Use Authorization (EUA). This EUA will remain in effect (meaning this test can be used) for  the duration of the COVID-19 declaration under Section 564(b)(1) of the Act, 21 U.S.C. section 360bbb-3(b)(1), unless the authorization is terminated or revoked.  Performed at [facility], [address] [place], [place] [address]   Urine Culture     Status: None   Collection Time: 06/08/22  9:34 PM   Specimen: In/Out Cath Urine  Result Value Ref Range Status   Specimen Description   Final    IN/OUT CATH URINE Performed at [facility], [address] [place], [place] [address]    Special Requests   Final    NONE Performed at [facility], [address] [place], [place] [address]    Culture   Final    NO GROWTH Performed at [facility], [place] [address] [place],  [address]    Report Status 06/10/2022 FINAL  Final  MRSA Next Gen by PCR, Nasal     Status: None   Collection Time: 06/10/22 11:49 PM   Specimen: Nasal Mucosa; Nasal Swab  Result Value Ref Range Status   MRSA by PCR Next Gen NOT DETECTED NOT DETECTED Final    Comment: (NOTE) The GeneXpert MRSA Assay (FDA approved for NASAL specimens only), is one component of a comprehensive MRSA colonization surveillance program. It is not intended to diagnose MRSA infection nor to guide or monitor treatment for MRSA infections. Test performance is not FDA approved in patients less than 30 years old. Performed at [facility], [place],  [address]   Respiratory (~20 pathogens) panel by PCR     Status: None   Collection Time: 06/12/22  6:15 AM   Specimen: Nasopharyngeal Swab; Respiratory  Result Value Ref Range Status   Adenovirus NOT DETECTED NOT DETECTED Final   Coronavirus 229E NOT DETECTED NOT DETECTED Final    Comment: (NOTE) The Coronavirus on the Respiratory Panel, DOES NOT test for the novel  Coronavirus (2019 nCoV)    Coronavirus HKU1 NOT DETECTED NOT DETECTED Final   Coronavirus NL63 NOT DETECTED NOT DETECTED Final   Coronavirus OC43  NOT DETECTED NOT DETECTED Final   Metapneumovirus NOT DETECTED NOT DETECTED Final   Rhinovirus / Enterovirus NOT DETECTED NOT DETECTED Final   Influenza A NOT DETECTED NOT DETECTED Final   Influenza B NOT DETECTED NOT DETECTED Final   Parainfluenza Virus 1 NOT DETECTED NOT DETECTED Final   Parainfluenza Virus 2 NOT DETECTED NOT DETECTED Final   Parainfluenza Virus 3 NOT DETECTED NOT DETECTED Final   Parainfluenza Virus 4 NOT DETECTED NOT DETECTED Final   Respiratory Syncytial Virus NOT DETECTED NOT DETECTED Final   Bordetella pertussis NOT DETECTED NOT DETECTED Final   Bordetella Parapertussis NOT DETECTED NOT DETECTED Final   Chlamydophila pneumoniae  NOT DETECTED NOT DETECTED Final   Mycoplasma pneumoniae NOT DETECTED NOT DETECTED Final    Comment: Performed at [facility], [place] [address], [place], [place] [address]    Coagulation Studies: No results for input(s): "LABPROT", "INR" in the last 72 hours.  Urinalysis: No results for input(s): "COLORURINE", "LABSPEC", "PHURINE", "GLUCOSEU", "HGBUR", "BILIRUBINUR", "KETONESUR", "PROTEINUR", "UROBILINOGEN", "NITRITE", "LEUKOCYTESUR" in the last 72 hours.  Invalid input(s): "APPERANCEUR"    Imaging: DG Chest Port 1 View  Result Date: 06/16/2022 CLINICAL DATA:  Acute on chronic respiratory failure. EXAM: PORTABLE CHEST 1 VIEW COMPARISON:  Chest x-ray June 15, 2022 FINDINGS: The ETT and left central line are stable, in good position. An NG tube terminates below today's film. No pneumothorax. Patchy opacity in the right mid lower lung is similar in the interval. Patchy opacity on the left is improved. Persistent opacity in the left base. No change in the cardiomediastinal silhouette. IMPRESSION: 1. Support apparatus as above. 2. Patchy opacities in the right mid and lower lung are similar in the interval. 3. Patchy opacities in the left base have improved. Electronically Signed   By: Dorise Bullion III M.D.   On: 06/16/2022 09:00    DG Abd 1 View  Result Date: 06/15/2022 CLINICAL DATA:  Orogastric tube placement EXAM: ABDOMEN - 1 VIEW COMPARISON:  05/14/2022 FINDINGS: Limited radiograph of the lower chest and upper abdomen was obtained for the purposes of enteric tube localization. Enteric tube is seen coursing below the diaphragm with distal tip and side port terminating within the expected location of the gastric body. IMPRESSION: Enteric tube terminates within the gastric body. Electronically Signed   By: Davina Poke D.O.   On: 06/15/2022 09:14   DG Chest Port 1 View  Result Date: 06/15/2022 CLINICAL DATA:  Intubated EXAM: PORTABLE CHEST 1 VIEW COMPARISON:  06/15/2022 at 0507 hours FINDINGS: Endotracheal tube has been slightly retracted, now terminating 2.6 cm above the carina. Enteric tube courses below the diaphragm with distal tip beyond the inferior margin of the film. Esophageal temperature probe is seen. Stable left IJ central venous catheter. Stable cardiomegaly. Perihilar airspace opacities, not significantly changed. Probable small bilateral pleural effusions. No pneumothorax. IMPRESSION: 1. Endotracheal tube has been slightly retracted, now terminating 2.6 cm above the carina. 2. Otherwise no significant interval change. Electronically Signed   By: Davina Poke D.O.   On: 06/15/2022 09:14   DG Chest Port 1 View  Result Date: 06/15/2022 CLINICAL DATA:  Ventilator dependent respiratory failure. [id number]. Hypoxia and hypercapnia. EXAM: PORTABLE CHEST 1 VIEW COMPARISON:  Portable chest yesterday at 12:11 p.m. FINDINGS: 5:11 a.m. ETT has been advanced to 2 cm from the carina. Esophageal temperature probe remains in the upper thoracic esophagus. NGT terminates in the gastric antrum based on the position of the side hole with the tip not filmed. Right IJ central line tip is in the distal SVC. Stable cardiomegaly and mediastinum. Aortic atherosclerosis. Central vascular prominence is unchanged. Perihilar opacities  extending out into the mid to lower lung fields are again noted and could be due to edema, pneumonia or combination. Small pleural effusions appear similar with no new or worsening lung opacities. Lung apices remain clear. IMPRESSION: 1. ETT has been advanced to 2 cm from the carina. 2. No other interval change. Stable perihilar opacities and small pleural effusions. 3. Stable cardiomegaly and central vascular prominence. Electronically Signed   By: Telford Nab M.D.   On: 06/15/2022 06:51   EEG adult  Result Date: 06/14/2022  Greta Doom, MD     06/14/2022  6:55 PM History: 72 yo F with encephalopathy Sedation: none Technique: This EEG was acquired with electrodes placed according to the International 10-20 electrode system (including Fp1, Fp2, F3, F4, C3, C4, P3, P4, O1, O2, T3, T4, T5, T6, A1, A2, Fz, Cz, Pz). The following electrodes were missing or displaced: none. Background: The background consists of generalized irregular slow activity, predominantly delta range with some intermixed theta. There was no evolution, rhythmicity, or other concerning features. There was no PDR seen. There was no epileptiform activity seen. Photic stimulation: Physiologic driving is not performed EEG Abnormalities: 1) Generalized irregular slow activity 2) Absent PDR Clinical Interpretation: This normal EEG is consistent with a generalized non-specific cerebral dysfunction(encephalopathy). There was no seizure or seizure predisposition recorded on this study. Please note that lack of epileptiform activity on EEG does not preclude the possibility of epilepsy. Roland Rack, MD [facility] [phone] If 7pm- 7am, please page neurology on call as listed in [place].   DG Chest Port 1 View  Result Date: 06/14/2022 CLINICAL DATA:  Hypoxia. EXAM: PORTABLE CHEST 1 VIEW COMPARISON:  June 13, 2022. FINDINGS: Stable cardiomediastinal silhouette. Endotracheal and nasogastric tubes are unchanged in  position. Left internal jugular catheter is noted with tip in expected position of the SVC. Stable bilateral lung opacities consistent with edema, pneumonia or atelectasis. Bony thorax is unremarkable. IMPRESSION: Stable support apparatus.  Stable bilateral lung opacities. Electronically Signed   By: Marijo Conception M.D.   On: 06/14/2022 12:28     Medications:    sodium chloride Stopped (06/15/22 0830)   ceFEPime (MAXIPIME) IV Stopped (06/16/22 0231)   dexmedetomidine (PRECEDEX) IV infusion 0.2 mcg/kg/hr (06/16/22 0723)   feeding supplement (VITAL HIGH PROTEIN) 60 mL/hr at 06/16/22 1100   fentaNYL infusion INTRAVENOUS Stopped (06/16/22 0703)   furosemide (LASIX) 200 mg in dextrose 5 % 100 mL (2 mg/mL) infusion 4 mg/hr (06/16/22 1100)   norepinephrine (LEVOPHED) Adult infusion 1 mcg/min (06/16/22 1100)   propofol (DIPRIVAN) infusion Stopped (06/16/22 0703)    artificial tears  1 Application Both Eyes H8E   budesonide (PULMICORT) nebulizer solution  0.5 mg Nebulization BID   Chlorhexidine Gluconate Cloth  6 each Topical Daily   dexamethasone (DECADRON) injection  4 mg Intravenous Q6H   docusate  100 mg Per Tube BID   fentaNYL (SUBLIMAZE) injection  25 mcg Intravenous Once   fluocinonide-emollient   Topical QODAY   free water  30 mL Per Tube Q4H   heparin  5,000 Units Subcutaneous Q8H   insulin aspart  0-20 Units Subcutaneous Q4H   ipratropium-albuterol  3 mL Nebulization Q4H   nutrition supplement (JUVEN)  1 packet Per Tube BID BM   mouth rinse  15 mL Mouth Rinse Q2H   pantoprazole (PROTONIX) IV  40 mg Intravenous Q24H   polyethylene glycol  17 g Per Tube Daily   rocuronium bromide  100 mg Intravenous Once   rOPINIRole  1 mg Per Tube TID   benzonatate, fentaNYL, ipratropium-albuterol, midazolam, ondansetron **OR** ondansetron (ZOFRAN) IV, vecuronium  Assessment/ Plan:  Samantha Mason is a 72 y.o.  female  with past medical history including COPD with renal failure requiring 2 L  oxygen as needed, chronic diastolic heart failure, sleep apnea, morbid obesity, hypertension, and chronic kidney disease stage IV. Patient presents to the emergency department feeling sick and was admitted for Sepsis [facility]) [A41.9] Cellulitis, unspecified cellulitis site [L03.90] Community acquired pneumonia, unspecified laterality [J18.9] Sepsis,  due to unspecified organism, unspecified whether acute organ dysfunction present [facility]) [A41.9]   Acute Kidney Injury on chronic kidney disease stage IIIb with baseline creatinine 1.55 and GFR of 36 on 04/17/22.  Acute kidney injury secondary to severe infection. No IV contrast exposure.  Diuretics held. Lower extremity edema present, however patient has history of lymphedema. Chest xray questionable to pulmonary edema.   Renal function has improved.  Creatinine down to 1.6.  Good urine output of 3 L.  Maintain the patient on Lasix drip for now.   Lab Results  Component Value Date   CREATININE 1.62 (H) 06/16/2022   CREATININE 1.87 (H) 06/15/2022   CREATININE 2.06 (H) 06/14/2022    Intake/Output Summary (Last 24 hours) at 06/16/2022 1140 Last data filed at 06/16/2022 1100 Gross per 24 hour  Intake 3460.9 ml  Output 3100 ml  Net 360.9 ml    2. Anemia of chronic kidney disease Lab Results  Component Value Date   HGB 9.0 (L) 06/16/2022    No need for erythropoietin stimulating agents immediately.  May need to consider this over the course of the hospitalization.  3.  Chronic diastolic heart failure.  Echo from 07/16/21 shows EF 55 to 60% with mild LVH.  Continues to respond well to Lasix drip.  Maintain this for now.  4. Secondary Hyperparathyroidism: with outpatient labs: PTH 79, phosphorus 3.0, calcium 10.0 on 04/17/22.   Continue to monitor bone metabolism parameters periodically.  .5.  Acute respiratory failure.  Patient still requiring significant ventilatory support.  FiO2 50% at the moment.  Weaning protocol as per pulmonary/critical  care.   LOS: 8 Samantha Mason 1/28/202411:40 AM

## 2022-06-17 ENCOUNTER — Other Ambulatory Visit (INDEPENDENT_AMBULATORY_CARE_PROVIDER_SITE_OTHER): Payer: Medicare Other | Admitting: Internal Medicine

## 2022-06-17 ENCOUNTER — Inpatient Hospital Stay: Payer: Medicare Other

## 2022-06-17 DIAGNOSIS — I10 Essential (primary) hypertension: Secondary | ICD-10-CM

## 2022-06-17 DIAGNOSIS — E662 Morbid (severe) obesity with alveolar hypoventilation: Secondary | ICD-10-CM

## 2022-06-17 DIAGNOSIS — I5033 Acute on chronic diastolic (congestive) heart failure: Secondary | ICD-10-CM

## 2022-06-17 DIAGNOSIS — G2581 Restless legs syndrome: Secondary | ICD-10-CM

## 2022-06-17 DIAGNOSIS — I5032 Chronic diastolic (congestive) heart failure: Secondary | ICD-10-CM

## 2022-06-17 DIAGNOSIS — J9611 Chronic respiratory failure with hypoxia: Secondary | ICD-10-CM

## 2022-06-17 DIAGNOSIS — G4733 Obstructive sleep apnea (adult) (pediatric): Secondary | ICD-10-CM

## 2022-06-17 DIAGNOSIS — A419 Sepsis, unspecified organism: Secondary | ICD-10-CM | POA: Diagnosis not present

## 2022-06-17 DIAGNOSIS — I5A Non-ischemic myocardial injury (non-traumatic): Secondary | ICD-10-CM

## 2022-06-17 DIAGNOSIS — Z8673 Personal history of transient ischemic attack (TIA), and cerebral infarction without residual deficits: Secondary | ICD-10-CM

## 2022-06-17 DIAGNOSIS — J9621 Acute and chronic respiratory failure with hypoxia: Secondary | ICD-10-CM

## 2022-06-17 DIAGNOSIS — J4550 Severe persistent asthma, uncomplicated: Secondary | ICD-10-CM

## 2022-06-17 DIAGNOSIS — I700 Atherosclerosis of aorta: Secondary | ICD-10-CM

## 2022-06-17 DIAGNOSIS — M1711 Unilateral primary osteoarthritis, right knee: Secondary | ICD-10-CM

## 2022-06-17 DIAGNOSIS — R6520 Severe sepsis without septic shock: Secondary | ICD-10-CM | POA: Diagnosis not present

## 2022-06-17 DIAGNOSIS — J449 Chronic obstructive pulmonary disease, unspecified: Secondary | ICD-10-CM

## 2022-06-17 DIAGNOSIS — D513 Other dietary vitamin B12 deficiency anemia: Secondary | ICD-10-CM

## 2022-06-17 LAB — CBC
HCT: 28.1 % — ABNORMAL LOW (ref 36.0–46.0)
Hemoglobin: 8.4 g/dL — ABNORMAL LOW (ref 12.0–15.0)
MCH: 24 pg — ABNORMAL LOW (ref 26.0–34.0)
MCHC: 29.9 g/dL — ABNORMAL LOW (ref 30.0–36.0)
MCV: 80.3 fL (ref 80.0–100.0)
Platelets: 194 10*3/uL (ref 150–400)
RBC: 3.5 MIL/uL — ABNORMAL LOW (ref 3.87–5.11)
RDW: 18.7 % — ABNORMAL HIGH (ref 11.5–15.5)
WBC: 11.7 10*3/uL — ABNORMAL HIGH (ref 4.0–10.5)
nRBC: 0 % (ref 0.0–0.2)

## 2022-06-17 LAB — RENAL FUNCTION PANEL
Albumin: 2.5 g/dL — ABNORMAL LOW (ref 3.5–5.0)
Anion gap: 13 (ref 5–15)
BUN: 101 mg/dL — ABNORMAL HIGH (ref 8–23)
CO2: 32 mmol/L (ref 22–32)
Calcium: 8.8 mg/dL — ABNORMAL LOW (ref 8.9–10.3)
Chloride: 100 mmol/L (ref 98–111)
Creatinine, Ser: 1.6 mg/dL — ABNORMAL HIGH (ref 0.44–1.00)
GFR, Estimated: 34 mL/min — ABNORMAL LOW (ref >60)
Glucose, Bld: 179 mg/dL — ABNORMAL HIGH (ref 70–99)
Phosphorus: 2.8 mg/dL (ref 2.5–4.6)
Potassium: 4.5 mmol/L (ref 3.5–5.1)
Sodium: 145 mmol/L (ref 135–145)

## 2022-06-17 LAB — GLUCOSE, CAPILLARY
Glucose-Capillary: 136 mg/dL — ABNORMAL HIGH (ref 70–99)
Glucose-Capillary: 138 mg/dL — ABNORMAL HIGH (ref 70–99)
Glucose-Capillary: 142 mg/dL — ABNORMAL HIGH (ref 70–99)
Glucose-Capillary: 153 mg/dL — ABNORMAL HIGH (ref 70–99)
Glucose-Capillary: 167 mg/dL — ABNORMAL HIGH (ref 70–99)
Glucose-Capillary: 170 mg/dL — ABNORMAL HIGH (ref 70–99)

## 2022-06-17 LAB — BLOOD GAS, ARTERIAL

## 2022-06-17 LAB — MAGNESIUM: Magnesium: 1.5 mg/dL — ABNORMAL LOW (ref 1.7–2.4)

## 2022-06-17 LAB — PROCALCITONIN: Procalcitonin: 0.2 ng/mL

## 2022-06-17 MED ORDER — MAGNESIUM SULFATE 4 GM/100ML IV SOLN
4.0000 g | Freq: Once | INTRAVENOUS | Status: AC
  Administered 2022-06-17: 4 g via INTRAVENOUS
  Filled 2022-06-17: qty 100

## 2022-06-17 MED ORDER — MAGNESIUM SULFATE 2 GM/50ML IV SOLN
2.0000 g | Freq: Once | INTRAVENOUS | Status: DC
  Filled 2022-06-17: qty 50

## 2022-06-17 MED ORDER — FENTANYL BOLUS VIA INFUSION: 50.0000 ug | INTRAVENOUS | Status: DC | PRN

## 2022-06-17 MED ORDER — FENTANYL CITRATE PF 50 MCG/ML IJ SOSY
50.0000 ug | PREFILLED_SYRINGE | INTRAMUSCULAR | Status: DC | PRN
  Administered 2022-06-17: 50 ug via INTRAVENOUS
  Filled 2022-06-17: qty 1

## 2022-06-17 NOTE — Progress Notes (Signed)
Received home health orders orders from [facility]. Start of care 05/16/22.   Certification and orders from 05/16/22 through 07-21-22 are reviewed, signed and faxed back to home health company.  Need of intermittent skilled services at home: homebound  The home health care plan has been established by me and will be reviewed and updated as needed to maximize patient recovery.  I certify that all home health services have been and will be furnished to the patient while under my care.  Face-to-face encounter in which the need for home health services was established: 05/14/22 at hospital discharge.  Patient is receiving home health services for the following diagnoses: Problem List Items Addressed This Visit       Cardiovascular and Mediastinum   Acute on chronic heart failure with preserved ejection fraction (HFpEF) (HCC) - Primary   Chronic diastolic CHF (congestive heart failure) (HCC)   Myocardial injury   Aortic atherosclerosis (HCC) (Chronic)   Essential hypertension (Chronic)     Respiratory   Acute on chronic respiratory failure with hypoxia and hypercapnia (HCC)   Chronic respiratory failure with hypoxia and hypercapnia (HCC)   Extreme obesity with alveolar hypoventilation (HCC)   Obstructive lung disease (generalized) (HCC) (Chronic)   OSA  (Chronic)   Severe persistent asthma (Chronic)     Musculoskeletal and Integument   Primary osteoarthritis of right knee     Other   B12 deficiency anemia   History of stroke   Morbid obesity with BMI of 50.0-59.9, adult ([place])   Restless leg syndrome     Halina Maidens, MD

## 2022-06-17 NOTE — Progress Notes (Addendum)
NAME:  Samantha Mason, MRN:  [id number], DOB:  12-20-1950, LOS: 9 ADMISSION DATE:  06/08/2022, CONSULTATION DATE:  06/11/2022 REFERRING MD:  Dr. Manuella Ghazi, CHIEF COMPLAINT:  Altered Mental Status, Hypercapnia  Brief Pt Description / Synopsis:  72 y.o. female with PMHx significant for HFpEF, COPD, OSA (noncompliant with CPAP), CKD Stage III who is  admitted with Sepsis in setting of lower extremity Cellulitis, AKI, and  Acute on Chronic Hypercapnic Respiratory Failure in setting AECOPD.  Course complicated by CO2 narcosis, failed BiPAP requiring intubation and mechanical ventilation.  History of Present Illness:  Samantha Mason is a 72 y.o. female with medical history significant for Morbid obesity, chronic HFpEF,CKD stage III, chronic lower extremity lymphedema on treatment with torsemide, hypertension, asthma, stroke (1995) and obstructive sleep apnea(non compliant with cpap) who presented to [facility] ED on 06/08/22 due to shortness of breath and generalized malaise for appropriately 1 week.  Pt is currently altered and no family currently available, therefore history is obtained from chart review.  Per ED and nursing notes, she reported about a 1 week history of feeling weak and fatigued, chills, bilateral lower extremity swelling and erythema (R>L), paroxsymal nocturnal dyspnea and orthopnea, weight gain with increased oxygen requirements.  She denied abdominal pain, N/V/D, dysuria.  ED Course: Initial Vital Signs: 105F orally, BP 129/53, HR 109, RR 27, SpO2 96% Significant Labs: BUN 39, Creatinine 1.59, Albumin 2.4, lactic acid 1.1, WBC 12.9, Hgb 8.0, Hct 27.7, Hgb A1c 5.8 COVID/FLU/RSV negative UA negative for UTI Imaging Chest X-ray>>IMPRESSION: Mild cardiomegaly with central pulmonary vascular congestion. CT Chest/Abdomen/Pelvis>> IMPRESSION: 1. Skin thickening and soft tissue edema of the partially imaged proximal right thigh. Correlate for cellulitis. 2. Newly enlarged bilateral inguinal  and iliac lymph nodes, likely reactive. 3. Diffuse bilateral bronchial wall thickening and mosaic attenuation of the airspaces, consistent with nonspecific infectious or inflammatory bronchitis. 4. Cardiomegaly.  She met Sepsis criteria, therefore she received IV fluids and antibiotics. Hospitalists were asked to admit for further workup and treatment os sepsis due to cellulitis.  Please see "significant hospital events" section below for full detailed hospital course.  Pertinent  Medical History   Past Medical History:  Diagnosis Date   Acute bronchitis    Anemia    Arthritis    Asthma    B12 deficiency 03/14/2017   Chest pain 07/02/2018   Chest pain    a. 06/2018 MV: EF 66%. No ischemia/infarct; b. 02/2021 PET CT: EF>60%, no ischemia/infarct.   Chronic heart failure with preserved ejection fraction (HFpEF) ([place])    a. 08/2020 Echo: EF 55-60%, GrI DD; b. 06/2021 Echo: EF 55-60%, mild LVH, nl RV fxn, no signif valvular dzs.   COPD (chronic obstructive pulmonary disease) ([place]) 07/24/2018   Family history of blood clots 07/16/2017   Irritable bowel syndrome    Malignant hypertension    Obesity    OSA (obstructive sleep apnea)    uses cpap   Pneumonia 2017   Restless leg syndrome    Stroke ([place]) 1995   mild     Micro Data:  1/20: SARS-CoV-2/influenza/RSV PCR>> negative 1/20: Blood culture x 2>> no growth  1/20: Urine>> negative 1/22: MRSA PCR>>negative  1/24: Respiratory viral panel>>negative  1/26: Tracheal aspirate>> not collected 1/29: Tracheal aspirate>>  Antimicrobials:   Anti-infectives (From admission, onward)    Start     Dose/Rate Route Frequency Ordered Stop   06/14/22 1400  ceFEPIme (MAXIPIME) 2 g in sodium chloride 0.9 % 100 mL IVPB  2 g 200 mL/hr over 30 Minutes Intravenous Every 12 hours 06/14/22 1301     06/13/22 1000  ceFEPIme (MAXIPIME) 2 g in sodium chloride 0.9 % 100 mL IVPB  Status:  Discontinued        2 g 200 mL/hr over 30 Minutes  Intravenous Every 24 hours 06/12/22 1430 06/13/22 0747   06/13/22 0900  cefTRIAXone (ROCEPHIN) 2 g in sodium chloride 0.9 % 100 mL IVPB  Status:  Discontinued        2 g 200 mL/hr over 30 Minutes Intravenous Every 24 hours 06/13/22 0747 06/14/22 1230   06/11/22 1145  vancomycin (VANCOREADY) IVPB 2000 mg/400 mL        2,000 mg 200 mL/hr over 120 Minutes Intravenous  Once 06/11/22 1047 06/11/22 1344   06/11/22 1145  ceFEPIme (MAXIPIME) 2 g in sodium chloride 0.9 % 100 mL IVPB  Status:  Discontinued        2 g 200 mL/hr over 30 Minutes Intravenous 2 times daily 06/11/22 1047 06/12/22 1430   06/10/22 2300  vancomycin (VANCOCIN) IVPB 1000 mg/200 mL premix  Status:  Discontinued       See Hyperspace for full Linked Orders Report.   1,000 mg 200 mL/hr over 60 Minutes Intravenous Every 48 hours 06/08/22 2307 06/09/22 0922   06/10/22 2300  vancomycin (VANCOREADY) IVPB 1250 mg/250 mL  Status:  Discontinued       See Hyperspace for full Linked Orders Report.   1,250 mg 166.7 mL/hr over 90 Minutes Intravenous Every 48 hours 06/08/22 2307 06/09/22 0922   06/10/22 1800  cephALEXin (KEFLEX) capsule 500 mg  Status:  Discontinued        500 mg Oral Every 6 hours 06/10/22 1521 06/11/22 1047   06/09/22 2200  vancomycin (VANCOREADY) IVPB 750 mg/150 mL  Status:  Discontinued        750 mg 150 mL/hr over 60 Minutes Intravenous Every 24 hours 06/09/22 0923 06/10/22 1521   06/09/22 1000  ceFEPIme (MAXIPIME) 2 g in sodium chloride 0.9 % 100 mL IVPB  Status:  Discontinued        2 g 200 mL/hr over 30 Minutes Intravenous Every 12 hours 06/09/22 0916 06/10/22 1521   06/08/22 2300  vancomycin (VANCOCIN) IVPB 1000 mg/200 mL premix  Status:  Discontinued        1,000 mg 200 mL/hr over 60 Minutes Intravenous  Once 06/08/22 2257 06/08/22 2307   06/08/22 2215  vancomycin (VANCOCIN) IVPB 1000 mg/200 mL premix       See Hyperspace for full Linked Orders Report.   1,000 mg 200 mL/hr over 60 Minutes Intravenous  Once  06/08/22 2210 06/09/22 0014   06/08/22 2215  vancomycin (VANCOREADY) IVPB 1500 mg/300 mL       See Hyperspace for full Linked Orders Report.   1,500 mg 150 mL/hr over 120 Minutes Intravenous  Once 06/08/22 2210 06/10/22 0807   06/08/22 1915  piperacillin-tazobactam (ZOSYN) IVPB 3.375 g        3.375 g 100 mL/hr over 30 Minutes Intravenous  Once 06/08/22 1909 06/08/22 [place] [facility] Events: Including procedures, antibiotic start and stop dates in addition to other pertinent events   1/20: Presented to ED, admitted by [facility] 1/21: Added cefepime, continue vancomycin, increase dose of Cardizem for better heart rate control 1/22: Had to reduce Cardizem dose back to her home dose as blood pressure dropped 1/23: Transferred to ICU overnight due to CO2 narcosis, placed  on BiPAP.  PCCM consulted.  Worsening AKI, holding diuresis.  Agitation requiring Precedex, high risk for Intubation.  Palliative Care consulted for [place] conversations.  ABX broadened back to Cefepime and Vancomycin 1/24: Pt on Bipap but awake and able to follow commands.  Will attempt to transition off Bipap.  Palliative Care following along to assist with goals of care conversations  1/25: Pt currently on Bipap and precedex gtt.  Required 2.5 mg iv valium overnight for delirium.  Unable to obtain CT Head due to respiratory status.  Ultimately required intubation. 1/26: Remains critically ill. Issues with vent dyschrony and ventilation, plan to start Nimbex gtt. Creatinine improved with diuresis, continue with diuresis, Nephrology considering Lasix gtt. 1/27: ABG's improved on Nimbex, persistent cuff leak, exchanged ETT. Decadron started due to upper airway swelling. Will attempt to d/c Nimbex. Good diuresis with Lasix gtt with 4.5 L of UOP last 24 hrs, Creatinine improving. 1/28: Continues with good diuresis on Lasix gtt, 3L of UOP over preceding 24 hrs, creatinine continues to improve.  Vent requirements slowly  improving. 1/29: No acute events overnight. Remains mechanically intubated with minimal vent settings: PEEP 5/FiO2 40%.  Remains on lasix gtt '@2mg'$ /hr.  Not on sedation and attempting to follow commands but extremely weak.  Will perform SBT as tolerated     Interim History / Subjective:  As outlined above under significant events   Objective   Blood pressure (!) 128/48, pulse 77, temperature 98.6 F (37 C), resp. rate (!) 21, height '5\' 4"'$  (1.626 m), weight (!) 152.5 kg, SpO2 90 %.    Vent Mode: PRVC FiO2 (%):  [40 %-60 %] 40 % Set Rate:  [20 bmp] 20 bmp Vt Set:  [450 mL] 450 mL PEEP:  [8 cmH20-10 cmH20] 8 cmH20 Pressure Support:  [20 cmH20] 20 cmH20 Plateau Pressure:  [24 cmH20] 24 cmH20   Intake/Output Summary (Last 24 hours) at 06/17/2022 0703 Last data filed at 06/17/2022 0600 Gross per 24 hour  Intake 2007.02 ml  Output 3300 ml  Net -1292.98 ml   Filed Weights   06/15/22 0500 06/16/22 0353 06/17/22 0429  Weight: (!) 151.4 kg (!) 151.2 kg (!) 152.5 kg    Examination: General: Acute on chronically ill-appearing obese female, laying in bed, intubated and sedated, in no acute distress HENT: Supple, no JVD  Lungs: Rhonchi throughout, even, non labored, overbreathes the vent  Cardiovascular: NSR, s1s2, no m/r/g, 2+ radial/1+ distal pulses, no edema  Abdomen: +BS x4, obese, non distended, non tender  Extremities: 1+ generalized edema, bilateral LE lymphedema with unna boots in place Neuro: Sedated, opens eyes and tracks, very weak attempting to follow commands, PERRL GU: Indwelling foley catheter draining clear yellow urine  Skin: Cellulitis to bilateral lower extremities, yeast excoriation to abdominal fold/groins  Resolved Hospital Problem list     Assessment & Plan:   Acute on Chronic Hypercapnic Respiratory Failure in setting of AECOPD & acute CHF exacerbation vs ARDS PMHx: OSA (noncompliant with CPAP at home), morbid obesity with probable OHS - Full vent support,  implement lung protective strategies - Plateau pressures less than 30 cm H20 - Wean FiO2 & PEEP as tolerated to maintain O2 sats 88 to 92% - Follow intermittent Chest X-ray & ABG as needed - Spontaneous Breathing Trials when respiratory parameters met and mental status permits - Implement VAP Bundle - Bronchodilators & Pulmicort nebs - Continue Decadron given upper airway edema noted on ETT exchange 1/27 wean as tolerated  - ABX as above - Diuresis  as BP and renal function permits - Pulmonary toilet as able  Acute on Chronic HFpEF Hypotension PMHx: HTN Echocardiogram 07/16/21: LVEF 55-60%, indeterminate diastolic parameters, RV systolic function normal - Continuous cardiac monitoring - Maintain MAP >65 - Vasopressors as needed to maintain MAP goal - Diuresis as BP and renal function permits to keep even~ Continue Lasix gtt as per Nephrology - Nephrology following, appreciate input - Repeat Echocardiogram performed on 06/12/22 ~ report results still pending  Sepsis secondary to Cellulitis of Lower extremities (Meets SIRS Criteria: HR >90, RR >20, fever) - Monitor fever curve - Trend WBC's & Procalcitonin - Follow respiratory culture - Continue empiric Cefepime and Vancomycin pending cultures & sensitivities  Acute Kidney Injury on CKD III ~ Improving  - Trend BMP  - Replace electrolytes as indicated  - Monitor UOP - Avoid nephrotoxic medication as able  - Nephrology following, appreciate input   Anemia of Chronic Disease - Trend CBC - Monitor for s/sx of bleeding  - Transfuse for hgb <7  Acute Metabolic Encephalopathy in the setting of CO2 Narcosis and multiple metabolic derangements Sedation needs in setting of Mechanical Ventilation CT Head 1/25 negative for acute intracranial abnormality EEG 1/26 negative for seizures - Maintain a RASS goal of 0 to -1  - Prn precedex and fentanyl gtts to maintain RASS goal  - Avoid sedating medications as able - Daily wake up  assessment    Pt is critically ill with multiorgan failure superimposed on multiple chronic co morbidities and morbid obesity.  High risk for further decompensation, cardiac arrest and death.  Anticipate she will be difficult to liberate from the ventilator, and may likely need Tracheotomy. She is DNR status, and family at bedside reports that pt WOULD NOT WANT a TRACHEOSTOMY, and they do not wish to pursue trach or feeding tube.  They wish to give her the 14 days of aggressive treatment and intubation and assess her response.  Palliative Care following to assist with goals of care. Best Practice (right click and "Reselect all SmartList Selections" daily)   Diet/type: NPO, tube feeds DVT prophylaxis: prophylactic heparin  GI prophylaxis: PPI Lines: Left IJ, and is still needed, left radial A-line and is still needed Foley:  foley catheter, and is still needed Code Status:  DNR Last date of multidisciplinary goals of care discussion [06/17/22]  1/29: Updated pts daughter at bedside regarding pt condition and current plan of care  Labs   CBC: Recent Labs  Lab 06/13/22 0421 06/14/22 0404 06/15/22 0404 06/16/22 0400 06/17/22 0406  WBC 8.6 15.8*  16.1* 14.0* 25.1* 11.7*  NEUTROABS  --  13.4*  --   --   --   HGB 9.7* 8.9*  9.0* 8.7* 9.0* 8.4*  HCT 32.6* 30.7*  30.3* 28.5* 30.8* 28.1*  MCV 79.9* 81.9  81.9 79.2* 81.5 80.3  PLT 247 310  319 220 271 737    Basic Metabolic Panel: Recent Labs  Lab 06/12/22 0736 06/13/22 0421 06/14/22 0404 06/15/22 0404 06/16/22 0400 06/17/22 0406  NA 139 139 139 140 142 145  K 4.8 4.6 3.5 4.1 4.2 4.5  CL 100 102 101 101 101 100  CO2 '22 25 26 29 '$ 32 32  GLUCOSE 155* 157* 144* 194* 192* 179*  BUN 70* 75* 70* 77* 88* 101*  CREATININE 2.85* 2.49* 2.06* 1.87* 1.62* 1.60*  CALCIUM 8.2* 8.5* 8.9 8.5* 8.8* 8.8*  MG 1.7 2.2 2.1 1.9  --  1.5*  PHOS 6.6* 5.4* 3.6 1.9* 2.6 2.8  GFR: Estimated Creatinine Clearance: 48.4 mL/min (A) (by C-G formula  based on SCr of 1.6 mg/dL (H)). Recent Labs  Lab 06/14/22 0404 06/15/22 0404 06/16/22 0400 06/17/22 0406  PROCALCITON 0.28 0.37 0.27  --   WBC 15.8*  16.1* 14.0* 25.1* 11.7*    Liver Function Tests: Recent Labs  Lab 06/13/22 0421 06/14/22 0404 06/15/22 0404 06/16/22 0400 06/17/22 0406  ALBUMIN 2.6* 2.5* 2.4* 2.5* 2.5*   No results for input(s): "LIPASE", "AMYLASE" in the last 168 hours. Recent Labs  Lab 06/13/22 0421  AMMONIA 20    ABG    Component Value Date/Time   PHART 7.46 (H) 06/15/2022 1500   PCO2ART 52 (H) 06/15/2022 1500   PO2ART 134 (H) 06/15/2022 1500   HCO3 37.0 (H) 06/15/2022 1500   ACIDBASEDEF 1.4 06/12/2022 0736   O2SAT 99.8 06/15/2022 1500     Coagulation Profile: No results for input(s): "INR", "PROTIME" in the last 168 hours.   Cardiac Enzymes: No results for input(s): "CKTOTAL", "CKMB", "CKMBINDEX", "TROPONINI" in the last 168 hours.  HbA1C: Hgb A1c MFr Bld  Date/Time Value Ref Range Status  06/08/2022 07:16 AM 5.8 (H) 4.8 - 5.6 % Final    Comment:    (NOTE) Pre diabetes:          5.7%-6.4%  Diabetes:              >6.4%  Glycemic control for   <7.0% adults with diabetes   07/15/2021 04:30 PM 5.4 4.8 - 5.6 % Final    Comment:    (NOTE) Pre diabetes:          5.7%-6.4%  Diabetes:              >6.4%  Glycemic control for   <7.0% adults with diabetes     CBG: Recent Labs  Lab 06/16/22 1152 06/16/22 1608 06/16/22 2000 06/16/22 2345 06/17/22 0403  GLUCAP 170* 191* 162* 152* 142*    Review of Systems:   Unable to assess due to AMS/intubation/sedation  Past Medical History:  She,  has a past medical history of Acute bronchitis, Anemia, Arthritis, Asthma, B12 deficiency (03/14/2017), Chest pain (07/02/2018), Chest pain, Chronic heart failure with preserved ejection fraction (HFpEF) ([place]), COPD (chronic obstructive pulmonary disease) ([place]) (07/24/2018), Family history of blood clots (07/16/2017), Irritable bowel syndrome,  Malignant hypertension, Obesity, OSA (obstructive sleep apnea), Pneumonia (2017), Restless leg syndrome, and Stroke ([place]) (1995).   Surgical History:   Past Surgical History:  Procedure Laterality Date   CHOLECYSTECTOMY     COLONOSCOPY WITH PROPOFOL N/A 03/31/2018   Procedure: COLONOSCOPY WITH PROPOFOL;  Surgeon: Lucilla Lame, MD;  Location: [facility] ENDOSCOPY;  Service: Endoscopy;  Laterality: N/A;   KNEE ARTHROPLASTY Right 04/30/2019   Procedure: COMPUTER ASSISTED TOTAL KNEE ARTHROPLASTY;  Surgeon: Dereck Leep, MD;  Location: [facility] ORS;  Service: Orthopedics;  Laterality: Right;   RIGHT HEART CATH N/A 01/30/2022   Procedure: RIGHT HEART CATH;  Surgeon: Nelva Bush, MD;  Location: [facility] CV LAB;  Service: Cardiovascular;  Laterality: N/A;   saliva gland removal  1998   TOTAL KNEE ARTHROPLASTY Left 2014   TUBAL LIGATION Bilateral      Social History:   reports that she has never smoked. She has never used smokeless tobacco. She reports that she does not drink alcohol and does not use drugs.   Family History:  Her family history includes COPD in her father; Coronary artery disease (age of onset: 44) in her brother; Dementia in her mother; Diabetes  in her father; Heart failure in her brother and maternal grandmother; Stroke in her maternal grandmother. There is no history of Breast cancer.   Allergies Allergies  Allergen Reactions   Nuvigil [Armodafinil] Hives   Penicillins Diarrhea and Nausea And Vomiting    Did it involve swelling of the face/tongue/throat, SOB, or low BP? no Did it involve sudden or severe rash/hives, skin peeling, or any reaction on the inside of your mouth or nose? No Did you need to seek medical attention at a hospital or doctor's office? No When did it last happen?  in her 63s    If all above answers are "NO", may proceed with cephalosporin use.    Spironolactone Itching   Gabapentin Other (See Comments)    "wiped her out, couldn't stay awake"    Melatonin Other (See Comments)    "Brain fog"   Provigil [Modafinil] Hives   Entresto [Sacubitril-Valsartan] Itching   Lisinopril Cough     Home Medications  Prior to Admission medications   Medication Sig Start Date End Date Taking? Authorizing Provider  acetaminophen (TYLENOL) 500 MG tablet Take 1,000 mg by mouth every 6 (six) hours as needed.   Yes [provider]  benzonatate (TESSALON) 100 MG capsule TAKE 1 CAPSULE BY MOUTH THREE TIMES A DAY AS NEEDED FOR COUGH 03/08/22  Yes Tyler Pita, MD  calcitRIOL (ROCALTROL) 0.25 MCG capsule Take 0.25 mcg by mouth daily. 02/22/22  Yes [provider]  dapagliflozin propanediol (FARXIGA) 10 MG TABS tablet Take 1 tablet (10 mg total) by mouth daily before breakfast. 03/07/22  Yes Hackney, Tina A, FNP  diltiazem (CARDIZEM CD) 240 MG 24 hr capsule Take 1 capsule (240 mg total) by mouth daily. 09/05/21  Yes Theora Gianotti, NP  ipratropium-albuterol (DUONEB) 0.5-2.5 (3) MG/3ML SOLN TAKE 3 MLS BY NEBULIZATION EVERY 6 (SIX) HOURS AS NEEDED. J45.40 03/04/22  Yes Tyler Pita, MD  metolazone (ZAROXOLYN) 2.5 MG tablet Take 1 tablet (2.5 mg total) by mouth 2 (two) times a week. Patient taking differently: Take 2.5 mg by mouth 2 (two) times a week. Monday and Friday 02/04/22  Yes Wouk, Ailene Rud, MD  ondansetron (ZOFRAN) 4 MG tablet Take 1 tablet (4 mg total) by mouth daily as needed for nausea or vomiting. 05/10/22 05/10/23 Yes Lorella Nimrod, MD  pantoprazole (PROTONIX) 40 MG tablet TAKE 1 TABLET BY MOUTH EVERY DAY 12/11/21  Yes Glean Hess, MD  PREVALITE 4 g packet DISSOLVE AND DRINK 1 PACKET BY MOUTH AT BEDTIME 03/05/22  Yes Glean Hess, MD  rOPINIRole (REQUIP) 1 MG tablet Take 1 tablet (1 mg total) by mouth 3 (three) times daily. 02/13/22  Yes Glean Hess, MD  Torsemide 40 MG TABS Take 40 mg by mouth daily.   Yes [provider]  budesonide (PULMICORT) 0.5 MG/2ML nebulizer solution TAKE 2 ML  (0.5 MG TOTAL) BY NEBULIZATION TWICE A DAY Patient not taking: Reported on 06/08/2022 10/29/21   Tyler Pita, MD  feeding supplement (ENSURE ENLIVE / ENSURE PLUS) LIQD Take 237 mLs by mouth 3 (three) times daily between meals. 05/10/22   Lorella Nimrod, MD  metoCLOPramide (REGLAN) 5 MG tablet Take 1 tablet (5 mg total) by mouth 3 (three) times daily before meals. Patient not taking: Reported on 06/08/2022 05/12/22   Sharen Hones, MD  NON FORMULARY Bipap - nightly with Oxygen 2 Lpm    [provider]  oxyCODONE 7.5 MG TABS Take 7.5 mg by mouth every 6 (six) hours  as needed. 05/10/22 05/10/23  Lorella Nimrod, MD  OXYGEN Inhale into the lungs. 2 LPM [place]    [provider]  promethazine (PHENERGAN) 25 MG tablet Take 1 tablet (25 mg total) by mouth every 6 (six) hours as needed. Patient not taking: Reported on 06/08/2022 05/10/22 05/10/23  Lorella Nimrod, MD     Critical care time: 9 minutes    Donell Beers, [place] Pager [phone] (please enter 7 digits) PCCM Consult Pager [phone] (please enter 7 digits)

## 2022-06-17 NOTE — Consult Note (Signed)
[facility] for Electrolyte Monitoring and Replacement   Recent Labs: Potassium (mmol/L)  Date Value  06/17/2022 4.5  05/25/2014 3.6   Magnesium (mg/dL)  Date Value  06/17/2022 1.5 (L)   Calcium (mg/dL)  Date Value  06/17/2022 8.8 (L)   Calcium, Total (mg/dL)  Date Value  05/25/2014 8.6   Albumin (g/dL)  Date Value  06/17/2022 2.5 (L)  05/25/2014 3.2 (L)   Phosphorus (mg/dL)  Date Value  06/17/2022 2.8   Sodium (mmol/L)  Date Value  06/17/2022 145  02/13/2022 135  05/25/2014 141    Assessment: 72 y.o. female with PMHx significant for HFpEF, COPD, OSA (noncompliant with CPAP), CKD Stage III who is  admitted with Sepsis in setting of lower extremity Cellulitis, AKI, and  Acute on Chronic Hypercapnic Respiratory Failure in setting AECOPD.  Course complicated by CO2 narcosis, failed BiPAP requiring intubation and mechanical ventilation. Pharmacy has been consulted to monitor and replace electrolytes while PCCM care.  Diuretics: furosemide infusion at 4 mg/hr  Nutrition: Vital at 60 mL/hr + FWF 30 mL q4h  Goal of Therapy:  Electrolytes WNL  Plan:  --Mag 1.5. Magnesium sulfate IV 4 grams x 1 --Will recheck electrolytes with AM labs   Wynelle Cleveland, PharmD, BCPS Clinical Pharmacist 06/17/2022 8:08 AM

## 2022-06-17 NOTE — Plan of Care (Signed)
Pt progressing slowly toward goal of care plan. Pt awake, alert and follows simple command nodes appropriately to questions. Pt able to wean on PS 40/8 all day. Precedex gtt was restarted around 1500 for restlessness. PRN fentanyl given X1 per Pt request. Pt have adequate urine out. Pt continued on lasix gtt.   Pt family have been at bedside all day and updated by MD.

## 2022-06-17 NOTE — TOC Progression Note (Signed)
Transition of Care [facility]) - Progression Note    Patient Details  Name: Samantha Mason MRN: [id number] Date of Birth: [birth year]  [facility] CM/SW Contact  Shelbie Hutching, RN Phone Number: 06/17/2022, 4:40 PM  Clinical Narrative:    Patient remains intubated in the ICU.  TOC continues to follow.    Expected Discharge Plan:  (TBD) Barriers to Discharge: Continued Medical Work up  Expected Discharge Plan and Services   Discharge Planning Services: CM Consult   Living arrangements for the past 2 months: Single Family Home                                       Social Determinants of Health (SDOH) Interventions SDOH Screenings   Food Insecurity: No Food Insecurity (06/09/2022)  Housing: Low Risk  (06/09/2022)  Transportation Needs: No Transportation Needs (06/09/2022)  Utilities: Not At Risk (06/09/2022)  Alcohol Screen: Low Risk  (08/08/2021)  Depression (PHQ2-9): Medium Risk (02/25/2022)  Financial Resource Strain: Medium Risk (08/14/2021)  Physical Activity: Inactive (08/08/2021)  Social Connections: Socially Isolated (08/08/2021)  Stress: No Stress Concern Present (08/08/2021)  Tobacco Use: Low Risk  (06/11/2022)    Readmission Risk Interventions     No data to display

## 2022-06-17 NOTE — Progress Notes (Signed)
[facility]  ROUNDING NOTE   Subjective:   Samantha Mason  is a 72 year old female with past medical history including COPD with renal failure requiring 2 L oxygen as needed, chronic diastolic heart failure, sleep apnea, morbid obesity, hypertension, and chronic kidney disease stage IV. Patient presents to the emergency department feeling sick for a few days.  She was admitted for Sepsis [facility]) [A41.9] Cellulitis, unspecified cellulitis site [L03.90] Community acquired pneumonia, unspecified laterality [J18.9] Sepsis, due to unspecified organism, unspecified whether acute organ dysfunction present [facility]) [A41.9]  Patient is followed by our office outpatient. She was last seen in office on Apr 17, 2022.   Patient remains critically ill at this time. She remains intubated, sedated, and on Lasix drip. Good UOP Patient is intubated.  Daughter at bedside.  FiO2 40%/PEEP 15 Patient is looking around the room and is able to wave.  01/28 0701 - 01/29 0700 In: 2007 [I.V.:202.4; NG/GT:1657; IV Piggyback:147.6] Out: 3300 [Urine:2750; Stool:550] Lab Results  Component Value Date   CREATININE 1.60 (H) 06/17/2022   CREATININE 1.62 (H) 06/16/2022   CREATININE 1.87 (H) 06/15/2022      Objective:  Vital signs in last 24 hours:  Temp:  [95.9 F (35.5 C)-98.6 F (37 C)] 97 F (36.1 C) (01/29 1500) Pulse Rate:  [66-96] 83 (01/29 1500) Resp:  [15-27] 15 (01/29 1500) BP: (114-173)/(38-66) 144/54 (01/29 1500) SpO2:  [90 %-96 %] 93 % (01/29 1539) Arterial Line BP: (85-158)/(50-100) 85/73 (01/29 1000) FiO2 (%):  [40 %-60 %] 40 % (01/29 1539) Weight:  [152.5 kg] 152.5 kg (01/29 0429)  Weight change: 1.3 kg Filed Weights   06/15/22 0500 06/16/22 0353 06/17/22 0429  Weight: (!) 151.4 kg (!) 151.2 kg (!) 152.5 kg    Intake/Output: I/O last 3 completed shifts: In: 3741.8 [I.V.:977.2; NG/GT:2517; IV Piggyback:247.6] Out: 7425 [Urine:4275; Stool:550]   Intake/Output this shift:   Total I/O In: 1550.8 [I.V.:75.3; Other:120; NG/GT:1103; IV Piggyback:252.5] Out: 9563 [Urine:1825]  Physical Exam: General: Critically ill-appearing  Head: Normocephalic, atraumatic.  Endotracheal tube in place  Eyes: Anicteric  Lungs:  Intubated on vent, coarse breath sounds bilateral  Heart: Regular rate and rhythm  Abdomen:  Soft, nontender  Extremities: 2+ peripheral edema.  Neurologic: Appears to respond and follow simple commands.  Skin: No lesions  Access: No hemodialysis access    Basic Metabolic Panel: Recent Labs  Lab 06/12/22 0736 06/13/22 0421 06/14/22 0404 06/15/22 0404 06/16/22 0400 06/17/22 0406  NA 139 139 139 140 142 145  K 4.8 4.6 3.5 4.1 4.2 4.5  CL 100 102 101 101 101 100  CO2 '22 25 26 29 '$ 32 32  GLUCOSE 155* 157* 144* 194* 192* 179*  BUN 70* 75* 70* 77* 88* 101*  CREATININE 2.85* 2.49* 2.06* 1.87* 1.62* 1.60*  CALCIUM 8.2* 8.5* 8.9 8.5* 8.8* 8.8*  MG 1.7 2.2 2.1 1.9  --  1.5*  PHOS 6.6* 5.4* 3.6 1.9* 2.6 2.8     Liver Function Tests: Recent Labs  Lab 06/13/22 0421 06/14/22 0404 06/15/22 0404 06/16/22 0400 06/17/22 0406  ALBUMIN 2.6* 2.5* 2.4* 2.5* 2.5*    No results for input(s): "LIPASE", "AMYLASE" in the last 168 hours. Recent Labs  Lab 06/13/22 0421  AMMONIA 20     CBC: Recent Labs  Lab 06/13/22 0421 06/14/22 0404 06/15/22 0404 06/16/22 0400 06/17/22 0406  WBC 8.6 15.8*  16.1* 14.0* 25.1* 11.7*  NEUTROABS  --  13.4*  --   --   --   HGB 9.7*  8.9*  9.0* 8.7* 9.0* 8.4*  HCT 32.6* 30.7*  30.3* 28.5* 30.8* 28.1*  MCV 79.9* 81.9  81.9 79.2* 81.5 80.3  PLT 247 310  319 220 271 194     Cardiac Enzymes: No results for input(s): "CKTOTAL", "CKMB", "CKMBINDEX", "TROPONINI" in the last 168 hours.  BNP: Invalid input(s): "POCBNP"  CBG: Recent Labs  Lab 06/16/22 2345 06/17/22 0403 06/17/22 0750 06/17/22 1122 06/17/22 1547  GLUCAP 152* 142* 167* 170* 153*     Microbiology: Results for orders placed or performed  during the hospital encounter of 06/08/22  Blood Culture (routine x 2)     Status: None   Collection Time: 06/08/22  7:18 PM   Specimen: BLOOD  Result Value Ref Range Status   Specimen Description BLOOD BLOOD LEFT ARM  Final   Special Requests   Final    BOTTLES DRAWN AEROBIC AND ANAEROBIC Blood Culture adequate volume   Culture   Final    NO GROWTH 5 DAYS Performed at [facility], [address], [place], [place] [address]    Report Status 06/13/2022 FINAL  Final  Blood Culture (routine x 2)     Status: None   Collection Time: 06/08/22  7:18 PM   Specimen: BLOOD  Result Value Ref Range Status   Specimen Description BLOOD BLOOD RIGHT ARM  Final   Special Requests   Final    BOTTLES DRAWN AEROBIC AND ANAEROBIC Blood Culture adequate volume   Culture   Final    NO GROWTH 5 DAYS Performed at [facility], [address] [place], [place] [address]    Report Status 06/13/2022 FINAL  Final  Resp panel by RT-PCR (RSV, Flu A&B, Covid) Anterior Nasal Swab     Status: None   Collection Time: 06/08/22  7:18 PM   Specimen: Anterior Nasal Swab  Result Value Ref Range Status   SARS Coronavirus 2 by RT PCR NEGATIVE NEGATIVE Final    Comment: (NOTE) SARS-CoV-2 target nucleic acids are NOT DETECTED.  The SARS-CoV-2 RNA is generally detectable in upper respiratory specimens during the acute phase of infection. The lowest concentration of SARS-CoV-2 viral copies this assay can detect is 138 copies/mL. A negative result does not preclude SARS-Cov-2 infection and should not be used as the sole basis for treatment or other patient management decisions. A negative result may occur with  improper specimen collection/handling, submission of specimen other than nasopharyngeal swab, presence of viral mutation(s) within the areas targeted by this assay, and inadequate number of viral copies(<138 copies/mL). A negative result must be combined with clinical observations, patient  history, and epidemiological information. The expected result is Negative.  Fact Sheet for Patients:  EntrepreneurPulse.com.au  Fact Sheet for Healthcare Providers:  IncredibleEmployment.be  This test is no t yet approved or cleared by the [place] FDA and  has been authorized for detection and/or diagnosis of SARS-CoV-2 by FDA under an Emergency Use Authorization (EUA). This EUA will remain  in effect (meaning this test can be used) for the duration of the COVID-19 declaration under Section 564(b)(1) of the Act, 21 U.S.C.section 360bbb-3(b)(1), unless the authorization is terminated  or revoked sooner.       Influenza A by PCR NEGATIVE NEGATIVE Final   Influenza B by PCR NEGATIVE NEGATIVE Final    Comment: (NOTE) The Xpert Xpress SARS-CoV-2/FLU/RSV plus assay is intended as an aid in the diagnosis of influenza from Nasopharyngeal swab specimens and should not be used as a sole basis for treatment.  Nasal washings and aspirates are unacceptable for Xpert Xpress SARS-CoV-2/FLU/RSV testing.  Fact Sheet for Patients: EntrepreneurPulse.com.au  Fact Sheet for Healthcare Providers: IncredibleEmployment.be  This test is not yet approved or cleared by the [place] FDA and has been authorized for detection and/or diagnosis of SARS-CoV-2 by FDA under an Emergency Use Authorization (EUA). This EUA will remain in effect (meaning this test can be used) for the duration of the COVID-19 declaration under Section 564(b)(1) of the Act, 21 U.S.C. section 360bbb-3(b)(1), unless the authorization is terminated or revoked.     Resp Syncytial Virus by PCR NEGATIVE NEGATIVE Final    Comment: (NOTE) Fact Sheet for Patients: EntrepreneurPulse.com.au  Fact Sheet for Healthcare Providers: IncredibleEmployment.be  This test is not yet approved or cleared by the [place] FDA  and has been authorized for detection and/or diagnosis of SARS-CoV-2 by FDA under an Emergency Use Authorization (EUA). This EUA will remain in effect (meaning this test can be used) for the duration of the COVID-19 declaration under Section 564(b)(1) of the Act, 21 U.S.C. section 360bbb-3(b)(1), unless the authorization is terminated or revoked.  Performed at [facility], [address], [place], [place] [address]   Urine Culture     Status: None   Collection Time: 06/08/22  9:34 PM   Specimen: In/Out Cath Urine  Result Value Ref Range Status   Specimen Description   Final    IN/OUT CATH URINE Performed at [facility], [address] [place], [place] [address]    Special Requests   Final    NONE Performed at [facility], [address], [place], [place] [address]    Culture   Final    NO GROWTH Performed at [facility], [place] [address] [place], [place] [address]    Report Status 06/10/2022 FINAL  Final  MRSA Next Gen by PCR, Nasal     Status: None   Collection Time: 06/10/22 11:49 PM   Specimen: Nasal Mucosa; Nasal Swab  Result Value Ref Range Status   MRSA by PCR Next Gen NOT DETECTED NOT DETECTED Final    Comment: (NOTE) The GeneXpert MRSA Assay (FDA approved for NASAL specimens only), is one component of a comprehensive MRSA colonization surveillance program. It is not intended to diagnose MRSA infection nor to guide or monitor treatment for MRSA infections. Test performance is not FDA approved in patients less than 102 years old. Performed at [facility], [place], [place] [address]   Respiratory (~20 pathogens) panel by PCR     Status: None   Collection Time: 06/12/22  6:15 AM   Specimen: Nasopharyngeal Swab; Respiratory  Result Value Ref Range Status   Adenovirus NOT DETECTED NOT DETECTED Final   Coronavirus 229E NOT DETECTED NOT DETECTED Final    Comment: (NOTE) The Coronavirus on the  Respiratory Panel, DOES NOT test for the novel  Coronavirus (2019 nCoV)    Coronavirus HKU1 NOT DETECTED NOT DETECTED Final   Coronavirus NL63 NOT DETECTED NOT DETECTED Final   Coronavirus OC43 NOT DETECTED NOT DETECTED Final   Metapneumovirus NOT DETECTED NOT DETECTED Final   Rhinovirus / Enterovirus NOT DETECTED NOT DETECTED Final   Influenza A NOT DETECTED NOT DETECTED Final   Influenza B NOT DETECTED NOT DETECTED Final   Parainfluenza Virus 1 NOT DETECTED NOT DETECTED Final   Parainfluenza Virus 2 NOT DETECTED NOT DETECTED Final   Parainfluenza Virus 3 NOT DETECTED NOT DETECTED Final   Parainfluenza Virus 4  NOT DETECTED NOT DETECTED Final   Respiratory Syncytial Virus NOT DETECTED NOT DETECTED Final   Bordetella pertussis NOT DETECTED NOT DETECTED Final   Bordetella Parapertussis NOT DETECTED NOT DETECTED Final   Chlamydophila pneumoniae NOT DETECTED NOT DETECTED Final   Mycoplasma pneumoniae NOT DETECTED NOT DETECTED Final    Comment: Performed at [facility], [place] [address], [place], [place] [address]    Coagulation Studies: No results for input(s): "LABPROT", "INR" in the last 72 hours.  Urinalysis: No results for input(s): "COLORURINE", "LABSPEC", "PHURINE", "GLUCOSEU", "HGBUR", "BILIRUBINUR", "KETONESUR", "PROTEINUR", "UROBILINOGEN", "NITRITE", "LEUKOCYTESUR" in the last 72 hours.  Invalid input(s): "APPERANCEUR"    Imaging: DG Chest Port 1 View  Result Date: 06/17/2022 CLINICAL DATA:  [id number] with acute on chronic respiratory failure, hypoxia and hypercapnia. EXAM: PORTABLE CHEST 1 VIEW COMPARISON:  Portable chest yesterday at 5:04 a.m. FINDINGS: 4:43 a.m. ETT/NGT appear adequately placed. Esophageal temperature probe in the midesophagus is similar as well as left IJ line with tip in the distal SVC. There is mild cardiomegaly, mild central vascular prominence without overt edema. There is patchy airspace disease in the lower lung fields, denser on the left with  small pleural effusions. The upper lung fields are clear with overall aeration unchanged. No new abnormality. The mediastinum is stable with aortic tortuosity and calcification. Moderate thoracic spondylosis. IMPRESSION: 1. No significant change since yesterday's study. Patchy airspace disease in the lower lung fields, denser on the left with small pleural effusions. 2. Stable cardiomegaly and mild central vascular prominence. Electronically Signed   By: Telford Nab M.D.   On: 06/17/2022 06:47   DG Chest Port 1 View  Result Date: 06/16/2022 CLINICAL DATA:  Acute on chronic respiratory failure. EXAM: PORTABLE CHEST 1 VIEW COMPARISON:  Chest x-ray June 15, 2022 FINDINGS: The ETT and left central line are stable, in good position. An NG tube terminates below today's film. No pneumothorax. Patchy opacity in the right mid lower lung is similar in the interval. Patchy opacity on the left is improved. Persistent opacity in the left base. No change in the cardiomediastinal silhouette. IMPRESSION: 1. Support apparatus as above. 2. Patchy opacities in the right mid and lower lung are similar in the interval. 3. Patchy opacities in the left base have improved. Electronically Signed   By: Dorise Bullion III M.D.   On: 06/16/2022 09:00     Medications:    sodium chloride 5 mL/hr at 06/17/22 1540   ceFEPime (MAXIPIME) IV Stopped (06/17/22 1440)   dexmedetomidine (PRECEDEX) IV infusion 0.1 mcg/kg/hr (06/17/22 1500)   feeding supplement (VITAL HIGH PROTEIN) 60 mL/hr at 06/17/22 1540   fentaNYL infusion INTRAVENOUS Stopped (06/17/22 0438)   furosemide (LASIX) 200 mg in dextrose 5 % 100 mL (2 mg/mL) infusion 4 mg/hr (06/17/22 1540)   norepinephrine (LEVOPHED) Adult infusion Stopped (06/16/22 2011)    artificial tears  1 Application Both Eyes V5I   budesonide (PULMICORT) nebulizer solution  0.5 mg Nebulization BID   Chlorhexidine Gluconate Cloth  6 each Topical Daily   docusate  100 mg Per Tube BID    fentaNYL (SUBLIMAZE) injection  25 mcg Intravenous Once   fluocinonide-emollient   Topical QODAY   free water  30 mL Per Tube Q4H   heparin  5,000 Units Subcutaneous Q8H   insulin aspart  0-20 Units Subcutaneous Q4H   ipratropium-albuterol  3 mL Nebulization Q4H   nutrition supplement (JUVEN)  1 packet Per Tube BID BM   mouth rinse  15 mL  Mouth Rinse Q2H   pantoprazole (PROTONIX) IV  40 mg Intravenous Q24H   polyethylene glycol  17 g Per Tube Daily   rOPINIRole  1 mg Per Tube TID   benzonatate, fentaNYL (SUBLIMAZE) injection, ipratropium-albuterol, midazolam, ondansetron **OR** ondansetron (ZOFRAN) IV  Assessment/ Plan:  Ms. Samantha Mason is a 72 y.o.  female  with past medical history including COPD with renal failure requiring 2 L oxygen as needed, chronic diastolic heart failure, sleep apnea, morbid obesity, hypertension, and chronic kidney disease stage IV. Patient presents to the emergency department feeling sick and was admitted for Sepsis [facility]) [A41.9] Cellulitis, unspecified cellulitis site [L03.90] Community acquired pneumonia, unspecified laterality [J18.9] Sepsis, due to unspecified organism, unspecified whether acute organ dysfunction present [facility]) [A41.9]   Acute Kidney Injury on chronic kidney disease stage IIIb with baseline creatinine 1.55 and GFR of 36 on 04/17/22.  Acute kidney injury secondary to severe infection. No IV contrast exposure.  Diuretics held. Lower extremity edema present, however patient has history of lymphedema. Chest xray questionable to pulmonary edema.   Creatinine stable at 1.6.  Good urine output of 2.7L.  Maintain the patient on Lasix drip for now.   Lab Results  Component Value Date   CREATININE 1.60 (H) 06/17/2022   CREATININE 1.62 (H) 06/16/2022   CREATININE 1.87 (H) 06/15/2022    Intake/Output Summary (Last 24 hours) at 06/17/2022 1640 Last data filed at 06/17/2022 1540 Gross per 24 hour  Intake 2402.08 ml  Output 3885 ml  Net  -1482.92 ml    2. Anemia of chronic kidney disease Lab Results  Component Value Date   HGB 8.4 (L) 06/17/2022    No need for erythropoietin stimulating agents immediately.  May need to consider this over the course of the hospitalization.  3.  Chronic diastolic heart failure.  Echo from 07/16/21 shows EF 55 to 60% with mild LVH.  Continues to respond well to Lasix drip.  Maintain this for now.  4. Secondary Hyperparathyroidism: with outpatient labs: PTH 79, phosphorus 3.0, calcium 10.0 on 04/17/22.   Continue to monitor bone metabolism parameters periodically.  .5.  Acute respiratory failure.  Patient still requiring significant ventilatory support.  FiO2 45% at the moment.  Weaning protocol as per pulmonary/critical care.   LOS: [place] 1/29/20244:40 PM

## 2022-06-18 DIAGNOSIS — A419 Sepsis, unspecified organism: Secondary | ICD-10-CM | POA: Diagnosis not present

## 2022-06-18 DIAGNOSIS — Z7189 Other specified counseling: Secondary | ICD-10-CM | POA: Diagnosis not present

## 2022-06-18 DIAGNOSIS — R6520 Severe sepsis without septic shock: Secondary | ICD-10-CM | POA: Diagnosis not present

## 2022-06-18 LAB — CBC
HCT: 29 % — ABNORMAL LOW (ref 36.0–46.0)
Hemoglobin: 8.6 g/dL — ABNORMAL LOW (ref 12.0–15.0)
MCH: 24.2 pg — ABNORMAL LOW (ref 26.0–34.0)
MCHC: 29.7 g/dL — ABNORMAL LOW (ref 30.0–36.0)
MCV: 81.7 fL (ref 80.0–100.0)
Platelets: 202 10*3/uL (ref 150–400)
RBC: 3.55 MIL/uL — ABNORMAL LOW (ref 3.87–5.11)
RDW: 18.9 % — ABNORMAL HIGH (ref 11.5–15.5)
WBC: 10.9 10*3/uL — ABNORMAL HIGH (ref 4.0–10.5)
nRBC: 0.2 % (ref 0.0–0.2)

## 2022-06-18 LAB — MAGNESIUM: Magnesium: 2.2 mg/dL (ref 1.7–2.4)

## 2022-06-18 LAB — RENAL FUNCTION PANEL
Albumin: 2.6 g/dL — ABNORMAL LOW (ref 3.5–5.0)
Anion gap: 10 (ref 5–15)
BUN: 116 mg/dL — ABNORMAL HIGH (ref 8–23)
CO2: 35 mmol/L — ABNORMAL HIGH (ref 22–32)
Calcium: 8.9 mg/dL (ref 8.9–10.3)
Chloride: 101 mmol/L (ref 98–111)
Creatinine, Ser: 1.52 mg/dL — ABNORMAL HIGH (ref 0.44–1.00)
GFR, Estimated: 37 mL/min — ABNORMAL LOW (ref >60)
Glucose, Bld: 130 mg/dL — ABNORMAL HIGH (ref 70–99)
Phosphorus: 2.6 mg/dL (ref 2.5–4.6)
Potassium: 3.6 mmol/L (ref 3.5–5.1)
Sodium: 146 mmol/L — ABNORMAL HIGH (ref 135–145)

## 2022-06-18 LAB — GLUCOSE, CAPILLARY
Glucose-Capillary: 120 mg/dL — ABNORMAL HIGH (ref 70–99)
Glucose-Capillary: 122 mg/dL — ABNORMAL HIGH (ref 70–99)
Glucose-Capillary: 132 mg/dL — ABNORMAL HIGH (ref 70–99)
Glucose-Capillary: 139 mg/dL — ABNORMAL HIGH (ref 70–99)
Glucose-Capillary: 140 mg/dL — ABNORMAL HIGH (ref 70–99)
Glucose-Capillary: 142 mg/dL — ABNORMAL HIGH (ref 70–99)
Glucose-Capillary: 145 mg/dL — ABNORMAL HIGH (ref 70–99)

## 2022-06-18 LAB — ECHOCARDIOGRAM COMPLETE
Area-P 1/2: 3.81 cm2
Height: 64 in
S' Lateral: 3.4 cm
Weight: 5584 oz

## 2022-06-18 MED ORDER — FAMOTIDINE 20 MG PO TABS
20.0000 mg | ORAL_TABLET | Freq: Two times a day (BID) | ORAL | Status: DC
  Administered 2022-06-18 – 2022-06-19 (×2): 20 mg
  Filled 2022-06-18 (×2): qty 1

## 2022-06-18 MED ORDER — IPRATROPIUM-ALBUTEROL 0.5-2.5 (3) MG/3ML IN SOLN
3.0000 mL | Freq: Four times a day (QID) | RESPIRATORY_TRACT | Status: DC
  Administered 2022-06-18 – 2022-06-24 (×23): 3 mL via RESPIRATORY_TRACT
  Filled 2022-06-18 (×24): qty 3

## 2022-06-18 NOTE — Progress Notes (Signed)
[facility]  ROUNDING NOTE   Subjective:   Samantha Mason  is a 72 year old female with past medical history including COPD with renal failure requiring 2 L oxygen as needed, chronic diastolic heart failure, sleep apnea, morbid obesity, hypertension, and chronic kidney disease stage IV. Patient presents to the emergency department feeling sick for a few days.  She was admitted for Sepsis [facility]) [A41.9] Cellulitis, unspecified cellulitis site [L03.90] Community acquired pneumonia, unspecified laterality [J18.9] Sepsis, due to unspecified organism, unspecified whether acute organ dysfunction present [facility]) [A41.9]  Patient is followed by our office outpatient. She was last seen in office on Apr 17, 2022.   Patient remains critically ill at this time. She remains intubated, sedated, and on Lasix drip. Good UOP Patient is intubated.  FiO2 40%/PEEP 8  01/29 0701 - 01/30 0700 In: 3127.9 [I.V.:402.4; NG/GT:2353; IV Piggyback:252.5] Out: 6967 [Urine:4625; Stool:350] Lab Results  Component Value Date   CREATININE 1.52 (H) 06/18/2022   CREATININE 1.60 (H) 06/17/2022   CREATININE 1.62 (H) 06/16/2022      Objective:  Vital signs in last 24 hours:  Temp:  [96.6 F (35.9 C)-97.9 F (36.6 C)] 97.9 F (36.6 C) (01/30 1700) Pulse Rate:  [67-92] 88 (01/30 1700) Resp:  [15-26] 24 (01/30 1700) BP: (114-150)/(40-58) 141/51 (01/30 1700) SpO2:  [90 %-94 %] 93 % (01/30 1700) FiO2 (%):  [40 %-45 %] 40 % (01/30 1510) Weight:  [147.2 kg] 147.2 kg (01/30 0500)  Weight change: -5.3 kg Filed Weights   06/16/22 0353 06/17/22 0429 06/18/22 0500  Weight: (!) 151.2 kg (!) 152.5 kg (!) 147.2 kg    Intake/Output: I/O last 3 completed shifts: In: 4172.9 [I.V.:446.4; Other:120; EL/FY:1017; IV Piggyback:452.5] Out: 5102 [HENID:7824; MPNTI:144]   Intake/Output this shift:  No intake/output data recorded.  Physical Exam: General: Critically ill-appearing  Head: Normocephalic,  atraumatic.  Endotracheal tube in place  Eyes: Anicteric  Lungs:  Intubated on vent, coarse breath sounds bilateral  Heart: Regular rate and rhythm  Abdomen:  Soft, nontender  Extremities: 2+ peripheral edema.  Neurologic: Appears to respond and follow simple commands.  Skin: No lesions  Access: No hemodialysis access    Basic Metabolic Panel: Recent Labs  Lab 06/13/22 0421 06/14/22 0404 06/15/22 0404 06/16/22 0400 06/17/22 0406 06/18/22 0423  NA 139 139 140 142 145 146*  K 4.6 3.5 4.1 4.2 4.5 3.6  CL 102 101 101 101 100 101  CO2 '25 26 29 '$ 32 32 35*  GLUCOSE 157* 144* 194* 192* 179* 130*  BUN 75* 70* 77* 88* 101* 116*  CREATININE 2.49* 2.06* 1.87* 1.62* 1.60* 1.52*  CALCIUM 8.5* 8.9 8.5* 8.8* 8.8* 8.9  MG 2.2 2.1 1.9  --  1.5* 2.2  PHOS 5.4* 3.6 1.9* 2.6 2.8 2.6     Liver Function Tests: Recent Labs  Lab 06/14/22 0404 06/15/22 0404 06/16/22 0400 06/17/22 0406 06/18/22 0423  ALBUMIN 2.5* 2.4* 2.5* 2.5* 2.6*    No results for input(s): "LIPASE", "AMYLASE" in the last 168 hours. Recent Labs  Lab 06/13/22 0421  AMMONIA 20     CBC: Recent Labs  Lab 06/14/22 0404 06/15/22 0404 06/16/22 0400 06/17/22 0406 06/18/22 0423  WBC 15.8*  16.1* 14.0* 25.1* 11.7* 10.9*  NEUTROABS 13.4*  --   --   --   --   HGB 8.9*  9.0* 8.7* 9.0* 8.4* 8.6*  HCT 30.7*  30.3* 28.5* 30.8* 28.1* 29.0*  MCV 81.9  81.9 79.2* 81.5 80.3 81.7  PLT 310  319  220 271 194 202     Cardiac Enzymes: No results for input(s): "CKTOTAL", "CKMB", "CKMBINDEX", "TROPONINI" in the last 168 hours.  BNP: Invalid input(s): "POCBNP"  CBG: Recent Labs  Lab 06/17/22 2336 06/18/22 0321 06/18/22 0744 06/18/22 1117 06/18/22 1639  GLUCAP 136* 139* 142* 120* 145*     Microbiology: Results for orders placed or performed during the hospital encounter of 06/08/22  Blood Culture (routine x 2)     Status: None   Collection Time: 06/08/22  7:18 PM   Specimen: BLOOD  Result Value Ref Range  Status   Specimen Description BLOOD BLOOD LEFT ARM  Final   Special Requests   Final    BOTTLES DRAWN AEROBIC AND ANAEROBIC Blood Culture adequate volume   Culture   Final    NO GROWTH 5 DAYS Performed at [facility], [address], [place], [place] [address]    Report Status 06/13/2022 FINAL  Final  Blood Culture (routine x 2)     Status: None   Collection Time: 06/08/22  7:18 PM   Specimen: BLOOD  Result Value Ref Range Status   Specimen Description BLOOD BLOOD RIGHT ARM  Final   Special Requests   Final    BOTTLES DRAWN AEROBIC AND ANAEROBIC Blood Culture adequate volume   Culture   Final    NO GROWTH 5 DAYS Performed at [facility], [address] [place], [place] [address]    Report Status 06/13/2022 FINAL  Final  Resp panel by RT-PCR (RSV, Flu A&B, Covid) Anterior Nasal Swab     Status: None   Collection Time: 06/08/22  7:18 PM   Specimen: Anterior Nasal Swab  Result Value Ref Range Status   SARS Coronavirus 2 by RT PCR NEGATIVE NEGATIVE Final    Comment: (NOTE) SARS-CoV-2 target nucleic acids are NOT DETECTED.  The SARS-CoV-2 RNA is generally detectable in upper respiratory specimens during the acute phase of infection. The lowest concentration of SARS-CoV-2 viral copies this assay can detect is 138 copies/mL. A negative result does not preclude SARS-Cov-2 infection and should not be used as the sole basis for treatment or other patient management decisions. A negative result may occur with  improper specimen collection/handling, submission of specimen other than nasopharyngeal swab, presence of viral mutation(s) within the areas targeted by this assay, and inadequate number of viral copies(<138 copies/mL). A negative result must be combined with clinical observations, patient history, and epidemiological information. The expected result is Negative.  Fact Sheet for Patients:  EntrepreneurPulse.com.au  Fact Sheet for  Healthcare Providers:  IncredibleEmployment.be  This test is no t yet approved or cleared by the [place] FDA and  has been authorized for detection and/or diagnosis of SARS-CoV-2 by FDA under an Emergency Use Authorization (EUA). This EUA will remain  in effect (meaning this test can be used) for the duration of the COVID-19 declaration under Section 564(b)(1) of the Act, 21 U.S.C.section 360bbb-3(b)(1), unless the authorization is terminated  or revoked sooner.       Influenza A by PCR NEGATIVE NEGATIVE Final   Influenza B by PCR NEGATIVE NEGATIVE Final    Comment: (NOTE) The Xpert Xpress SARS-CoV-2/FLU/RSV plus assay is intended as an aid in the diagnosis of influenza from Nasopharyngeal swab specimens and should not be used as a sole basis for treatment. Nasal washings and aspirates are unacceptable for Xpert Xpress SARS-CoV-2/FLU/RSV testing.  Fact Sheet for Patients: EntrepreneurPulse.com.au  Fact Sheet for Healthcare Providers: IncredibleEmployment.be  This test is not  yet approved or cleared by the [place] and has been authorized for detection and/or diagnosis of SARS-CoV-2 by FDA under an Emergency Use Authorization (EUA). This EUA will remain in effect (meaning this test can be used) for the duration of the COVID-19 declaration under Section 564(b)(1) of the Act, 21 U.S.C. section 360bbb-3(b)(1), unless the authorization is terminated or revoked.     Resp Syncytial Virus by PCR NEGATIVE NEGATIVE Final    Comment: (NOTE) Fact Sheet for Patients: EntrepreneurPulse.com.au  Fact Sheet for Healthcare Providers: IncredibleEmployment.be  This test is not yet approved or cleared by the [place] FDA and has been authorized for detection and/or diagnosis of SARS-CoV-2 by FDA under an Emergency Use Authorization (EUA). This EUA will remain in effect (meaning this  test can be used) for the duration of the COVID-19 declaration under Section 564(b)(1) of the Act, 21 U.S.C. section 360bbb-3(b)(1), unless the authorization is terminated or revoked.  Performed at [facility], [address], [place], [place] [address]   Urine Culture     Status: None   Collection Time: 06/08/22  9:34 PM   Specimen: In/Out Cath Urine  Result Value Ref Range Status   Specimen Description   Final    IN/OUT CATH URINE Performed at [facility], [address], [place], [place] [address]    Special Requests   Final    NONE Performed at [facility], [address], [place], [place] [address]    Culture   Final    NO GROWTH Performed at [facility], [place] [address] [place], [place] [address]    Report Status 06/10/2022 FINAL  Final  MRSA Next Gen by PCR, Nasal     Status: None   Collection Time: 06/10/22 11:49 PM   Specimen: Nasal Mucosa; Nasal Swab  Result Value Ref Range Status   MRSA by PCR Next Gen NOT DETECTED NOT DETECTED Final    Comment: (NOTE) The GeneXpert MRSA Assay (FDA approved for NASAL specimens only), is one component of a comprehensive MRSA colonization surveillance program. It is not intended to diagnose MRSA infection nor to guide or monitor treatment for MRSA infections. Test performance is not FDA approved in patients less than 44 years old. Performed at [facility], [place], [place] [address]   Respiratory (~20 pathogens) panel by PCR     Status: None   Collection Time: 06/12/22  6:15 AM   Specimen: Nasopharyngeal Swab; Respiratory  Result Value Ref Range Status   Adenovirus NOT DETECTED NOT DETECTED Final   Coronavirus 229E NOT DETECTED NOT DETECTED Final    Comment: (NOTE) The Coronavirus on the Respiratory Panel, DOES NOT test for the novel  Coronavirus (2019 nCoV)    Coronavirus HKU1 NOT DETECTED NOT DETECTED Final   Coronavirus NL63 NOT DETECTED NOT DETECTED Final    Coronavirus OC43 NOT DETECTED NOT DETECTED Final   Metapneumovirus NOT DETECTED NOT DETECTED Final   Rhinovirus / Enterovirus NOT DETECTED NOT DETECTED Final   Influenza A NOT DETECTED NOT DETECTED Final   Influenza B NOT DETECTED NOT DETECTED Final   Parainfluenza Virus 1 NOT DETECTED NOT DETECTED Final   Parainfluenza Virus 2 NOT DETECTED NOT DETECTED Final   Parainfluenza Virus 3 NOT DETECTED NOT DETECTED Final   Parainfluenza Virus 4 NOT DETECTED NOT DETECTED Final   Respiratory Syncytial Virus NOT DETECTED NOT DETECTED Final   Bordetella pertussis NOT DETECTED NOT DETECTED Final   Bordetella Parapertussis NOT  DETECTED NOT DETECTED Final   Chlamydophila pneumoniae NOT DETECTED NOT DETECTED Final   Mycoplasma pneumoniae NOT DETECTED NOT DETECTED Final    Comment: Performed at [facility], [place] [address] [place], [place] [address]  Culture, Respiratory w Gram Stain     Status: None (Preliminary result)   Collection Time: 06/17/22  8:15 AM   Specimen: Tracheal Aspirate; Respiratory  Result Value Ref Range Status   Specimen Description   Final    TRACHEAL ASPIRATE Performed at [facility], [address], [place], [place] [address]    Special Requests   Final    NONE Performed at [facility], [address] [place], [place] [address]    Gram Stain   Final    RARE WBC PRESENT, PREDOMINANTLY PMN RARE SQUAMOUS EPITHELIAL CELLS PRESENT FEW YEAST    Culture   Final    CULTURE REINCUBATED FOR BETTER GROWTH Performed at [facility], [place] [address] [place], [place] [address]    Report Status PENDING  Incomplete    Coagulation Studies: No results for input(s): "LABPROT", "INR" in the last 72 hours.  Urinalysis: No results for input(s): "COLORURINE", "LABSPEC", "PHURINE", "GLUCOSEU", "HGBUR", "BILIRUBINUR", "KETONESUR", "PROTEINUR", "UROBILINOGEN", "NITRITE", "LEUKOCYTESUR" in the last 72 hours.  Invalid input(s): "APPERANCEUR"     Imaging: DG Chest Port 1 View  Result Date: 06/17/2022 CLINICAL DATA:  [id number] with acute on chronic respiratory failure, hypoxia and hypercapnia. EXAM: PORTABLE CHEST 1 VIEW COMPARISON:  Portable chest yesterday at 5:04 a.m. FINDINGS: 4:43 a.m. ETT/NGT appear adequately placed. Esophageal temperature probe in the midesophagus is similar as well as left IJ line with tip in the distal SVC. There is mild cardiomegaly, mild central vascular prominence without overt edema. There is patchy airspace disease in the lower lung fields, denser on the left with small pleural effusions. The upper lung fields are clear with overall aeration unchanged. No new abnormality. The mediastinum is stable with aortic tortuosity and calcification. Moderate thoracic spondylosis. IMPRESSION: 1. No significant change since yesterday's study. Patchy airspace disease in the lower lung fields, denser on the left with small pleural effusions. 2. Stable cardiomegaly and mild central vascular prominence. Electronically Signed   By: Telford Nab M.D.   On: 06/17/2022 06:47     Medications:    sodium chloride Stopped (06/17/22 2305)   ceFEPime (MAXIPIME) IV Stopped (06/18/22 1452)   dexmedetomidine (PRECEDEX) IV infusion 0.2 mcg/kg/hr (06/18/22 1748)   feeding supplement (VITAL HIGH PROTEIN) 60 mL/hr at 06/18/22 1751   fentaNYL infusion INTRAVENOUS Stopped (06/17/22 0438)   furosemide (LASIX) 200 mg in dextrose 5 % 100 mL (2 mg/mL) infusion 4 mg/hr (06/18/22 1751)    artificial tears  1 Application Both Eyes R4Y   budesonide (PULMICORT) nebulizer solution  0.5 mg Nebulization BID   Chlorhexidine Gluconate Cloth  6 each Topical Daily   docusate  100 mg Per Tube BID   famotidine  20 mg Per Tube BID   fentaNYL (SUBLIMAZE) injection  25 mcg Intravenous Once   fluocinonide-emollient   Topical QODAY   free water  30 mL Per Tube Q4H   heparin  5,000 Units Subcutaneous Q8H   insulin aspart  0-20 Units Subcutaneous Q4H    ipratropium-albuterol  3 mL Nebulization Q6H   nutrition supplement (JUVEN)  1 packet Per Tube BID BM   mouth rinse  15 mL Mouth Rinse Q2H   polyethylene glycol  17 g Per Tube Daily   rOPINIRole  1 mg  Per Tube TID   benzonatate, fentaNYL (SUBLIMAZE) injection, ipratropium-albuterol, midazolam, ondansetron **OR** ondansetron (ZOFRAN) IV  Assessment/ Plan:  Ms. Samantha Mason is a 72 y.o.  female  with past medical history including COPD with renal failure requiring 2 L oxygen as needed, chronic diastolic heart failure, sleep apnea, morbid obesity, hypertension, and chronic kidney disease stage IV. Patient presents to the emergency department feeling sick and was admitted for Sepsis [facility]) [A41.9] Cellulitis, unspecified cellulitis site [L03.90] Community acquired pneumonia, unspecified laterality [J18.9] Sepsis, due to unspecified organism, unspecified whether acute organ dysfunction present [facility]) [A41.9]   Acute Kidney Injury on chronic kidney disease stage IIIb with baseline creatinine 1.55 and GFR of 36 on 04/17/22.  Acute kidney injury secondary to severe infection. No IV contrast exposure.  Diuretics held. Lower extremity edema present, however patient has history of lymphedema. Chest xray questionable to pulmonary edema.   Creatinine improved at 1.5.  Good urine output of 4.6 L.  Maintain the patient on Lasix drip for now.  Can consider changing to intermittent dosing starting Wednesday. Patient is is developing azotemia with high BUN.  It may be related to high-protein tube feeds as creatinine is improving.   Lab Results  Component Value Date   CREATININE 1.52 (H) 06/18/2022   CREATININE 1.60 (H) 06/17/2022   CREATININE 1.62 (H) 06/16/2022    Intake/Output Summary (Last 24 hours) at 06/18/2022 1909 Last data filed at 06/18/2022 1751 Gross per 24 hour  Intake 2172.34 ml  Output 5425 ml  Net -3252.66 ml    2. Anemia of chronic kidney disease Lab Results  Component Value Date    HGB 8.6 (L) 06/18/2022    No need for erythropoietin stimulating agents immediately.  May need to consider this over the course of the hospitalization.  3.  Chronic diastolic heart failure.  Echo from 07/16/21 shows EF 55 to 60% with mild LVH.  Continues to respond well to Lasix drip.  Maintain this for now.  4.  Acute respiratory failure.  Patient still requiring significant ventilatory support.  FiO2 40% at the moment.  Weaning protocol as per pulmonary/critical care.   LOS: [place] 1/30/20247:09 PM

## 2022-06-18 NOTE — Progress Notes (Signed)
Daily Progress Note   Patient Name: Samantha Mason       Date: 06/18/2022 DOB: 1950-05-30  Age: 72 y.o. MRN#: [id number] Attending Physician: Flora Lipps, MD Primary Care Physician: Glean Hess, MD Admit Date: 06/08/2022  Reason for Consultation/Follow-up: Establishing goals of care  Subjective: Notes and labs reviewed.  Per notes, goals set to provide ventilator for up to 14 days if needed, and extubate at that time, if not already. Goals clear for no trach and no PEG.   Patient resting in bed on ventilator at this time. Spoke with CCM, patient is making progress towards extubation, and goals are clear. As per conversation,  PMT will sign off at this time. CCM will plan to reconsult if needed.   Length of Stay: 10  Current Medications: Scheduled Meds:   artificial tears  1 Application Both Eyes E1R   budesonide (PULMICORT) nebulizer solution  0.5 mg Nebulization BID   Chlorhexidine Gluconate Cloth  6 each Topical Daily   docusate  100 mg Per Tube BID   famotidine  20 mg Per Tube BID   fentaNYL (SUBLIMAZE) injection  25 mcg Intravenous Once   fluocinonide-emollient   Topical QODAY   free water  30 mL Per Tube Q4H   heparin  5,000 Units Subcutaneous Q8H   insulin aspart  0-20 Units Subcutaneous Q4H   ipratropium-albuterol  3 mL Nebulization Q6H   nutrition supplement (JUVEN)  1 packet Per Tube BID BM   mouth rinse  15 mL Mouth Rinse Q2H   polyethylene glycol  17 g Per Tube Daily   rOPINIRole  1 mg Per Tube TID    Continuous Infusions:  sodium chloride Stopped (06/17/22 2305)   ceFEPime (MAXIPIME) IV 2 g (06/18/22 1422)   dexmedetomidine (PRECEDEX) IV infusion 0.2 mcg/kg/hr (06/18/22 0600)   feeding supplement (VITAL HIGH PROTEIN) 60 mL/hr at 06/18/22 0842   fentaNYL  infusion INTRAVENOUS Stopped (06/17/22 0438)   furosemide (LASIX) 200 mg in dextrose 5 % 100 mL (2 mg/mL) infusion 4 mg/hr (06/18/22 1421)    PRN Meds: benzonatate, fentaNYL (SUBLIMAZE) injection, ipratropium-albuterol, midazolam, ondansetron **OR** ondansetron (ZOFRAN) IV  Physical Exam Constitutional:      Comments: Eyes closed.   Pulmonary:     Comments: On ventilator.  Vital Signs: BP (!) 127/53 (BP Location: Right Wrist)   Pulse 80   Temp (!) 97.5 F (36.4 C) (Esophageal)   Resp 20   Ht '5\' 4"'$  (1.626 m)   Wt (!) 147.2 kg   SpO2 92%   BMI 55.70 kg/m  SpO2: SpO2: 92 % O2 Device: O2 Device: Ventilator O2 Flow Rate: O2 Flow Rate (L/min): 40 L/min  Intake/output summary:  Intake/Output Summary (Last 24 hours) at 06/18/2022 1506 Last data filed at 06/18/2022 1100 Gross per 24 hour  Intake 2076.14 ml  Output 4400 ml  Net -2323.86 ml   LBM: Last BM Date : 06/18/22 Baseline Weight: Weight: (!) 158.3 kg Most recent weight: Weight: (!) 147.2 kg   Patient Active Problem List   Diagnosis Date Noted   Cellulitis 16/02/9603   Acute metabolic encephalopathy 54/01/8118   Severe sepsis ([place]) 06/08/2022   AMS (altered mental status) 04/30/2022   Small bowel obstruction (HCC) 04/26/2022   Hypokalemia 01/26/2022   SOB (shortness of breath)    Extreme obesity with alveolar hypoventilation (HCC)    CKD (chronic kidney disease) stage 4, GFR 15-29 ml/min (HCC) 01/23/2022   Chronic respiratory failure with hypoxia and hypercapnia (HCC) 10/15/2021   Acute on chronic heart failure with preserved ejection fraction (HFpEF) ([place]) 08/27/2021   Hyponatremia 07/26/2021   Insomnia 07/22/2021   Acute renal failure superimposed on stage 3b chronic kidney disease ([place]) 07/17/2021   Acute on chronic respiratory failure with hypoxia and hypercapnia (HCC) 07/15/2021   Chronic asthmatic bronchitis 07/15/2021   Morbid obesity ([place]) 07/15/2021   Myocardial injury 07/15/2021   Chronic  diastolic CHF (congestive heart failure) ([place])    History of stroke 11/17/2020   Ovarian failure 02/21/2020   Primary osteoarthritis of right knee 02/13/2019   S/P total knee arthroplasty, left 02/13/2019   Aortic atherosclerosis ([place]) 08/01/2018   Obstructive lung disease (generalized) ([place]) 07/24/2018   B12 deficiency anemia 07/07/2018   Benign neoplasm of ascending colon    Severe persistent asthma 02/13/2018   Vitamin D deficiency 03/17/2017   Prediabetes 03/14/2017   Microcytic anemia 03/14/2017   Irritable bowel syndrome with diarrhea 03/12/2017   Allergic rhinitis 03/12/2017   Restless leg syndrome 03/12/2017   GERD (gastroesophageal reflux disease) 10/16/2016   Essential hypertension 10/16/2016   Morbid obesity with BMI of 50.0-59.9, adult ([place]) 12/08/2014   OSA  12/08/2014   Shortness of breath 04/07/2014    Palliative Care Assessment & Plan    Recommendations/Plan:  Goals are currently set. Spoke with CCM regarding plans, PMT will sign off at this time. CCM will plan to reconsult if needed.     Code Status:    Code Status Orders  (From admission, onward)           Start     Ordered   06/13/22 1324  Do not attempt resuscitation (DNR)  Continuous       Question Answer Comment  If patient has no pulse and is not breathing Do Not Attempt Resuscitation   If patient has a pulse and/or is breathing: Medical Treatment Goals MEDICAL INTERVENTIONS DESIRED: Use advanced airway interventions, mechanical ventilation or cardioversion in appropriate circumstances; Use medication/IV fluids as indicated; Provide comfort medications; Transfer to Progressive/Stepdown/ICU as indicated.   Consent: Discussion documented in EHR or advanced directives reviewed      06/13/22 1323           Code Status History     Date Active Date Inactive Code Status Order ID Comments  User Context   06/08/2022 2257 06/13/2022 1323 Full Code [id number]  Artist Beach, MD ED   04/26/2022  2000 05/14/2022 2105 Full Code [id number]  Athena Masse, MD ED   01/23/2022 2031 02/01/2022 1759 Full Code [id number]  Collier Bullock, MD ED   07/15/2021 0947 07/27/2021 2149 Full Code [id number]  Ivor Costa, MD ED   07/15/2021 0636 07/15/2021 0947 Full Code [id number]  Howerter, Ethelda Chick, DO ED   03/06/2021 0314 03/10/2021 1821 Full Code [id number]  Athena Masse, MD ED   09/19/2020 2232 09/22/2020 2339 Full Code [id number]  Toy Baker, MD ED   08/29/2020 1914 09/02/2020 1814 Full Code [id number]  Marcelyn Bruins, MD ED   04/30/2019 1426 05/02/2019 2057 Full Code [id number]  Dereck Leep, MD Inpatient   07/24/2018 1522 07/26/2018 1818 Full Code [id number]  Salary, Avel Peace, MD Inpatient   07/02/2018 0320 07/02/2018 1855 Full Code [id number]  Salary, Avel Peace, MD Inpatient      Advance Directive Documentation    Flowsheet Row Most Recent Value  Type of Advance Directive Healthcare Power of Attorney  Pre-existing out of facility DNR order (yellow form or pink MOST form) --  "MOST" Form in Place? --       Thank you for allowing the Palliative Medicine Team to assist in the care of this patient.   Asencion Gowda, NP  Please contact Palliative Medicine Team phone at [phone] for questions and concerns.

## 2022-06-18 NOTE — Progress Notes (Signed)
NAME:  Samantha Mason, MRN:  [id number], DOB:  [birth year], LOS: 62 ADMISSION DATE:  06/08/2022, CONSULTATION DATE:  06/11/2022 REFERRING MD:  Dr. Manuella Ghazi, CHIEF COMPLAINT:  Altered Mental Status, Hypercapnia  Brief Pt Description / Synopsis:  72 y.o. female with PMHx significant for HFpEF, COPD, OSA (noncompliant with CPAP), CKD Stage III who is  admitted with Sepsis in setting of lower extremity Cellulitis, AKI, and  Acute on Chronic Hypercapnic Respiratory Failure in setting AECOPD.  Course complicated by CO2 narcosis, failed BiPAP requiring intubation and mechanical ventilation.  History of Present Illness:  Samantha Mason is a 72 y.o. female with medical history significant for Morbid obesity, chronic HFpEF,CKD stage III, chronic lower extremity lymphedema on treatment with torsemide, hypertension, asthma, stroke (1995) and obstructive sleep apnea(non compliant with cpap) who [facility] on 06/08/22 due to shortness of breath and generalized malaise for appropriately 1 week.  Pt is currently altered and no family currently available, therefore history is obtained from chart review.  Per ED and nursing notes, she reported about a 1 week history of feeling weak and fatigued, chills, bilateral lower extremity swelling and erythema (R>L), paroxsymal nocturnal dyspnea and orthopnea, weight gain with increased oxygen requirements.  She denied abdominal pain, N/V/D, dysuria.  ED Course: Initial Vital Signs: 105F orally, BP 129/53, HR 109, RR 27, SpO2 96% Significant Labs: BUN 39, Creatinine 1.59, Albumin 2.4, lactic acid 1.1, WBC 12.9, Hgb 8.0, Hct 27.7, Hgb A1c 5.8 COVID/FLU/RSV negative UA negative for UTI Imaging Chest X-ray>>IMPRESSION: Mild cardiomegaly with central pulmonary vascular congestion. CT Chest/Abdomen/Pelvis>> IMPRESSION: 1. Skin thickening and soft tissue edema of the partially imaged proximal right thigh. Correlate for cellulitis. 2. Newly enlarged bilateral inguinal  and iliac lymph nodes, likely reactive. 3. Diffuse bilateral bronchial wall thickening and mosaic attenuation of the airspaces, consistent with nonspecific infectious or inflammatory bronchitis. 4. Cardiomegaly.  She met Sepsis criteria, therefore she received IV fluids and antibiotics. Hospitalists were asked to admit for further workup and treatment os sepsis due to cellulitis.  Please see "significant hospital events" section below for full detailed hospital course.  Pertinent  Medical History   Past Medical History:  Diagnosis Date   Acute bronchitis    Anemia    Arthritis    Asthma    B12 deficiency 03/14/2017   Chest pain 07/02/2018   Chest pain    a. 06/2018 MV: EF 66%. No ischemia/infarct; b. 02/2021 PET CT: EF>60%, no ischemia/infarct.   Chronic heart failure with preserved ejection fraction (HFpEF) ([place])    a. 08/2020 Echo: EF 55-60%, GrI DD; b. 06/2021 Echo: EF 55-60%, mild LVH, nl RV fxn, no signif valvular dzs.   COPD (chronic obstructive pulmonary disease) ([place]) 07/24/2018   Family history of blood clots 07/16/2017   Irritable bowel syndrome    Malignant hypertension    Obesity    OSA (obstructive sleep apnea)    uses cpap   Pneumonia 2017   Restless leg syndrome    Stroke ([place]) 1995   mild     Micro Data:  1/20: SARS-CoV-2/influenza/RSV PCR>> negative 1/20: Blood culture x 2>> no growth  1/20: Urine>> negative 1/22: MRSA PCR>>negative  1/24: Respiratory viral panel>>negative  1/26: Tracheal aspirate>> not collected 1/29: Tracheal aspirate>>  Antimicrobials:   Anti-infectives (From admission, onward)    Start     Dose/Rate Route Frequency Ordered Stop   06/14/22 1400  ceFEPIme (MAXIPIME) 2 g in sodium chloride 0.9 % 100 mL IVPB  2 g 200 mL/hr over 30 Minutes Intravenous Every 12 hours 06/14/22 1301     06/13/22 1000  ceFEPIme (MAXIPIME) 2 g in sodium chloride 0.9 % 100 mL IVPB  Status:  Discontinued        2 g 200 mL/hr over 30 Minutes  Intravenous Every 24 hours 06/12/22 1430 06/13/22 0747   06/13/22 0900  cefTRIAXone (ROCEPHIN) 2 g in sodium chloride 0.9 % 100 mL IVPB  Status:  Discontinued        2 g 200 mL/hr over 30 Minutes Intravenous Every 24 hours 06/13/22 0747 06/14/22 1230   06/11/22 1145  vancomycin (VANCOREADY) IVPB 2000 mg/400 mL        2,000 mg 200 mL/hr over 120 Minutes Intravenous  Once 06/11/22 1047 06/11/22 1344   06/11/22 1145  ceFEPIme (MAXIPIME) 2 g in sodium chloride 0.9 % 100 mL IVPB  Status:  Discontinued        2 g 200 mL/hr over 30 Minutes Intravenous 2 times daily 06/11/22 1047 06/12/22 1430   06/10/22 2300  vancomycin (VANCOCIN) IVPB 1000 mg/200 mL premix  Status:  Discontinued       See Hyperspace for full Linked Orders Report.   1,000 mg 200 mL/hr over 60 Minutes Intravenous Every 48 hours 06/08/22 2307 06/09/22 0922   06/10/22 2300  vancomycin (VANCOREADY) IVPB 1250 mg/250 mL  Status:  Discontinued       See Hyperspace for full Linked Orders Report.   1,250 mg 166.7 mL/hr over 90 Minutes Intravenous Every 48 hours 06/08/22 2307 06/09/22 0922   06/10/22 1800  cephALEXin (KEFLEX) capsule 500 mg  Status:  Discontinued        500 mg Oral Every 6 hours 06/10/22 1521 06/11/22 1047   06/09/22 2200  vancomycin (VANCOREADY) IVPB 750 mg/150 mL  Status:  Discontinued        750 mg 150 mL/hr over 60 Minutes Intravenous Every 24 hours 06/09/22 0923 06/10/22 1521   06/09/22 1000  ceFEPIme (MAXIPIME) 2 g in sodium chloride 0.9 % 100 mL IVPB  Status:  Discontinued        2 g 200 mL/hr over 30 Minutes Intravenous Every 12 hours 06/09/22 0916 06/10/22 1521   06/08/22 2300  vancomycin (VANCOCIN) IVPB 1000 mg/200 mL premix  Status:  Discontinued        1,000 mg 200 mL/hr over 60 Minutes Intravenous  Once 06/08/22 2257 06/08/22 2307   06/08/22 2215  vancomycin (VANCOCIN) IVPB 1000 mg/200 mL premix       See Hyperspace for full Linked Orders Report.   1,000 mg 200 mL/hr over 60 Minutes Intravenous  Once  06/08/22 2210 06/09/22 0014   06/08/22 2215  vancomycin (VANCOREADY) IVPB 1500 mg/300 mL       See Hyperspace for full Linked Orders Report.   1,500 mg 150 mL/hr over 120 Minutes Intravenous  Once 06/08/22 2210 06/10/22 0807   06/08/22 1915  piperacillin-tazobactam (ZOSYN) IVPB 3.375 g        3.375 g 100 mL/hr over 30 Minutes Intravenous  Once 06/08/22 1909 06/08/22 [place] [facility] Events: Including procedures, antibiotic start and stop dates in addition to other pertinent events   1/20: Presented to ED, admitted by [facility] 1/21: Added cefepime, continue vancomycin, increase dose of Cardizem for better heart rate control 1/22: Had to reduce Cardizem dose back to her home dose as blood pressure dropped 1/23: Transferred to ICU overnight due to CO2 narcosis, placed  on BiPAP.  PCCM consulted.  Worsening AKI, holding diuresis.  Agitation requiring Precedex, high risk for Intubation.  Palliative Care consulted for [place] conversations.  ABX broadened back to Cefepime and Vancomycin 1/24: Pt on Bipap but awake and able to follow commands.  Will attempt to transition off Bipap.  Palliative Care following along to assist with goals of care conversations  1/25: Pt currently on Bipap and precedex gtt.  Required 2.5 mg iv valium overnight for delirium.  Unable to obtain CT Head due to respiratory status.  Ultimately required intubation. 1/26: Remains critically ill. Issues with vent dyschrony and ventilation, plan to start Nimbex gtt. Creatinine improved with diuresis, continue with diuresis, Nephrology considering Lasix gtt. 1/27: ABG's improved on Nimbex, persistent cuff leak, exchanged ETT. Decadron started due to upper airway swelling. Will attempt to d/c Nimbex. Good diuresis with Lasix gtt with 4.5 L of UOP last 24 hrs, Creatinine improving. 1/28: Continues with good diuresis on Lasix gtt, 3L of UOP over preceding 24 hrs, creatinine continues to improve.  Vent requirements slowly  improving. 1/29: No acute events overnight. Remains mechanically intubated with minimal vent settings: PEEP 5/FiO2 40%.  Remains on lasix gtt '@2mg'$ /hr.  Not on sedation and attempting to follow commands but extremely weak.  Will perform SBT as tolerated   1/30: No acute events overnight.  Pt awake and following commands on precedex gtt '@0'$ .2 mcg/kg/hr.  Lasix gtt '@4'$  mg/hr. Tolerating SBT 15/8  Interim History / Subjective:  As outlined above under significant events   Objective   Blood pressure (!) 116/43, pulse 70, temperature (!) 96.6 F (35.9 C), resp. rate 20, height '5\' 4"'$  (1.626 m), weight (!) 147.2 kg, SpO2 91 %.    Vent Mode: PSV FiO2 (%):  [40 %-45 %] 40 % Set Rate:  [20 bmp] 20 bmp Vt Set:  [450 mL] 450 mL PEEP:  [8 cmH20] 8 cmH20 Pressure Support:  [15 cmH20] 15 cmH20 Plateau Pressure:  [24 cmH20] 24 cmH20   Intake/Output Summary (Last 24 hours) at 06/18/2022 0737 Last data filed at 06/18/2022 0600 Gross per 24 hour  Intake 3127.89 ml  Output 4975 ml  Net -1847.11 ml   Filed Weights   06/16/22 0353 06/17/22 0429 06/18/22 0500  Weight: (!) 151.2 kg (!) 152.5 kg (!) 147.2 kg    Examination: General: Acute on chronically ill-appearing obese female, laying in bed, intubated and sedated, in no acute distress HENT: Supple, no JVD  Lungs: Rhonchi throughout, even, non labored, overbreathes the vent  Cardiovascular: NSR, s1s2, no m/r/g, 2+ radial/1+ distal pulses, no edema  Abdomen: +BS x4, obese, non distended, non tender  Extremities: 1+ generalized edema, bilateral LE lymphedema with unna boots in place Neuro: Lightly sedated easily arouses, following commands, PERRL GU: Indwelling foley catheter draining clear yellow urine  Skin: Cellulitis to bilateral lower extremities, yeast excoriation to abdominal fold/groins  Resolved Hospital Problem list     Assessment & Plan:   Acute on Chronic Hypercapnic Respiratory Failure in setting of AECOPD & acute CHF exacerbation vs  ARDS PMHx: OSA (noncompliant with CPAP at home), morbid obesity with probable OHS - Full vent support, implement lung protective strategies - Plateau pressures less than 30 cm H20 - Wean FiO2 & PEEP as tolerated to maintain O2 sats 88 to 92% - Follow intermittent Chest X-ray & ABG as needed - Spontaneous Breathing Trials when respiratory parameters met and mental status permits - Implement VAP Bundle - Bronchodilators & Pulmicort nebs - ABX as above -  Diuresis as BP and renal function permits - Pulmonary toilet as able  Acute on Chronic HFpEF Hypotension PMHx: HTN Echocardiogram 07/16/21: LVEF 55-60%, indeterminate diastolic parameters, RV systolic function normal - Continuous cardiac monitoring - Maintain MAP >65 - Vasopressors as needed to maintain MAP goal - Diuresis as BP and renal function permits to keep even~ Continue Lasix gtt as per Nephrology - Nephrology following, appreciate input - Repeat Echocardiogram performed on 06/12/22 ~ report results still pending  Sepsis secondary to Cellulitis of Lower extremities (Meets SIRS Criteria: HR >90, RR >20, fever) - Monitor fever curve - Trend WBC's & Procalcitonin - Follow respiratory culture - Continue empiric Cefepime and Vancomycin pending cultures & sensitivities  Acute Kidney Injury on CKD III ~ Improving  - Trend BMP  - Replace electrolytes as indicated  - Monitor UOP - Avoid nephrotoxic medication as able  - Nephrology following, appreciate input   Anemia of Chronic Disease - Trend CBC - Monitor for s/sx of bleeding  - Transfuse for hgb <7  Acute Metabolic Encephalopathy in the setting of CO2 Narcosis and multiple metabolic derangements Sedation needs in setting of Mechanical Ventilation CT Head 1/25 negative for acute intracranial abnormality EEG 1/26 negative for seizures - Maintain a RASS goal of 0 to -1  - Prn precedex gtt to maintain RASS goal  - Avoid sedating medications as able - Daily wake up  assessment    Pt is critically ill with multiorgan failure superimposed on multiple chronic co morbidities and morbid obesity.  High risk for further decompensation, cardiac arrest and death.  Anticipate she will be difficult to liberate from the ventilator, and may likely need Tracheotomy. She is DNR status, and family at bedside reports that pt WOULD NOT WANT a TRACHEOSTOMY, and they do not wish to pursue trach or feeding tube.  They wish to give her the 14 days of aggressive treatment and intubation and assess her response.  Palliative Care following to assist with goals of care. Best Practice (right click and "Reselect all SmartList Selections" daily)   Diet/type: NPO, tube feeds DVT prophylaxis: prophylactic heparin  GI prophylaxis: PPI Lines: Left IJ, and is still needed Foley:  foley catheter, and is still needed Code Status:  DNR Last date of multidisciplinary goals of care discussion [06/18/22]  1/30: Will update pts family when they arrive at bedside   CBC: Recent Labs  Lab 06/14/22 0404 06/15/22 0404 06/16/22 0400 06/17/22 0406 06/18/22 0423  WBC 15.8*  16.1* 14.0* 25.1* 11.7* 10.9*  NEUTROABS 13.4*  --   --   --   --   HGB 8.9*  9.0* 8.7* 9.0* 8.4* 8.6*  HCT 30.7*  30.3* 28.5* 30.8* 28.1* 29.0*  MCV 81.9  81.9 79.2* 81.5 80.3 81.7  PLT 310  319 220 271 194 734    Basic Metabolic Panel: Recent Labs  Lab 06/13/22 0421 06/14/22 0404 06/15/22 0404 06/16/22 0400 06/17/22 0406 06/18/22 0423  NA 139 139 140 142 145 146*  K 4.6 3.5 4.1 4.2 4.5 3.6  CL 102 101 101 101 100 101  CO2 '25 26 29 '$ 32 32 35*  GLUCOSE 157* 144* 194* 192* 179* 130*  BUN 75* 70* 77* 88* 101* 116*  CREATININE 2.49* 2.06* 1.87* 1.62* 1.60* 1.52*  CALCIUM 8.5* 8.9 8.5* 8.8* 8.8* 8.9  MG 2.2 2.1 1.9  --  1.5* 2.2  PHOS 5.4* 3.6 1.9* 2.6 2.8 2.6   GFR: Estimated Creatinine Clearance: 49.9 mL/min (A) (by C-G formula based on SCr of  1.52 mg/dL (H)). Recent Labs  Lab 06/14/22 0404  06/15/22 0404 06/16/22 0400 06/17/22 0406 06/18/22 0423  PROCALCITON 0.28 0.37 0.27 0.20  --   WBC 15.8*  16.1* 14.0* 25.1* 11.7* 10.9*    Liver Function Tests: Recent Labs  Lab 06/14/22 0404 06/15/22 0404 06/16/22 0400 06/17/22 0406 06/18/22 0423  ALBUMIN 2.5* 2.4* 2.5* 2.5* 2.6*   No results for input(s): "LIPASE", "AMYLASE" in the last 168 hours. Recent Labs  Lab 06/13/22 0421  AMMONIA 20    ABG    Component Value Date/Time   PHART 7.43 06/17/2022 1026   PCO2ART 18 (LL) 06/17/2022 1026   PO2ART 149 (H) 06/17/2022 1026   HCO3 11.9 (L) 06/17/2022 1026   ACIDBASEDEF 9.8 (H) 06/17/2022 1026   O2SAT 99.1 06/17/2022 1026     Coagulation Profile: No results for input(s): "INR", "PROTIME" in the last 168 hours.   Cardiac Enzymes: No results for input(s): "CKTOTAL", "CKMB", "CKMBINDEX", "TROPONINI" in the last 168 hours.  HbA1C: Hgb A1c MFr Bld  Date/Time Value Ref Range Status  06/08/2022 07:16 AM 5.8 (H) 4.8 - 5.6 % Final    Comment:    (NOTE) Pre diabetes:          5.7%-6.4%  Diabetes:              >6.4%  Glycemic control for   <7.0% adults with diabetes   07/15/2021 04:30 PM 5.4 4.8 - 5.6 % Final    Comment:    (NOTE) Pre diabetes:          5.7%-6.4%  Diabetes:              >6.4%  Glycemic control for   <7.0% adults with diabetes     CBG: Recent Labs  Lab 06/17/22 1122 06/17/22 1547 06/17/22 1939 06/17/22 2336 06/18/22 0321  GLUCAP 170* 153* 138* 136* 139*    Review of Systems:   Unable to assess due to AMS/intubation/sedation  Past Medical History:  She,  has a past medical history of Acute bronchitis, Anemia, Arthritis, Asthma, B12 deficiency (03/14/2017), Chest pain (07/02/2018), Chest pain, Chronic heart failure with preserved ejection fraction (HFpEF) ([place]), COPD (chronic obstructive pulmonary disease) ([place]) (07/24/2018), Family history of blood clots (07/16/2017), Irritable bowel syndrome, Malignant hypertension, Obesity, OSA  (obstructive sleep apnea), Pneumonia (2017), Restless leg syndrome, and Stroke ([place]) (1995).   Surgical History:   Past Surgical History:  Procedure Laterality Date   CHOLECYSTECTOMY     COLONOSCOPY WITH PROPOFOL N/A 03/31/2018   Procedure: COLONOSCOPY WITH PROPOFOL;  Surgeon: Lucilla Lame, MD;  Location: [facility] ENDOSCOPY;  Service: Endoscopy;  Laterality: N/A;   KNEE ARTHROPLASTY Right 04/30/2019   Procedure: COMPUTER ASSISTED TOTAL KNEE ARTHROPLASTY;  Surgeon: Dereck Leep, MD;  Location: [facility] ORS;  Service: Orthopedics;  Laterality: Right;   RIGHT HEART CATH N/A 01/30/2022   Procedure: RIGHT HEART CATH;  Surgeon: Nelva Bush, MD;  Location: [place] CV LAB;  Service: Cardiovascular;  Laterality: N/A;   saliva gland removal  1998   TOTAL KNEE ARTHROPLASTY Left 2014   TUBAL LIGATION Bilateral      Social History:   reports that she has never smoked. She has never used smokeless tobacco. She reports that she does not drink alcohol and does not use drugs.   Family History:  Her family history includes COPD in her father; Coronary artery disease (age of onset: 40) in her brother; Dementia in her mother; Diabetes in her father; Heart failure in her brother and maternal  grandmother; Stroke in her maternal grandmother. There is no history of Breast cancer.   Allergies Allergies  Allergen Reactions   Nuvigil [Armodafinil] Hives   Penicillins Diarrhea and Nausea And Vomiting    Did it involve swelling of the face/tongue/throat, SOB, or low BP? no Did it involve sudden or severe rash/hives, skin peeling, or any reaction on the inside of your mouth or nose? No Did you need to seek medical attention at a hospital or doctor's office? No When did it last happen?  in her 43s    If all above answers are "NO", may proceed with cephalosporin use.    Spironolactone Itching   Gabapentin Other (See Comments)    "wiped her out, couldn't stay awake"   Melatonin Other (See Comments)     "Brain fog"   Provigil [Modafinil] Hives   Entresto [Sacubitril-Valsartan] Itching   Lisinopril Cough     Home Medications  Prior to Admission medications   Medication Sig Start Date End Date Taking? Authorizing Provider  acetaminophen (TYLENOL) 500 MG tablet Take 1,000 mg by mouth every 6 (six) hours as needed.   Yes [provider]  benzonatate (TESSALON) 100 MG capsule TAKE 1 CAPSULE BY MOUTH THREE TIMES A DAY AS NEEDED FOR COUGH 03/08/22  Yes Tyler Pita, MD  calcitRIOL (ROCALTROL) 0.25 MCG capsule Take 0.25 mcg by mouth daily. 02/22/22  Yes [provider]  dapagliflozin propanediol (FARXIGA) 10 MG TABS tablet Take 1 tablet (10 mg total) by mouth daily before breakfast. 03/07/22  Yes Hackney, Tina A, FNP  diltiazem (CARDIZEM CD) 240 MG 24 hr capsule Take 1 capsule (240 mg total) by mouth daily. 09/05/21  Yes Theora Gianotti, NP  ipratropium-albuterol (DUONEB) 0.5-2.5 (3) MG/3ML SOLN TAKE 3 MLS BY NEBULIZATION EVERY 6 (SIX) HOURS AS NEEDED. J45.40 03/04/22  Yes Tyler Pita, MD  metolazone (ZAROXOLYN) 2.5 MG tablet Take 1 tablet (2.5 mg total) by mouth 2 (two) times a week. Patient taking differently: Take 2.5 mg by mouth 2 (two) times a week. Monday and Friday 02/04/22  Yes Wouk, Ailene Rud, MD  ondansetron (ZOFRAN) 4 MG tablet Take 1 tablet (4 mg total) by mouth daily as needed for nausea or vomiting. 05/10/22 05/10/23 Yes Lorella Nimrod, MD  pantoprazole (PROTONIX) 40 MG tablet TAKE 1 TABLET BY MOUTH EVERY DAY 12/11/21  Yes Glean Hess, MD  PREVALITE 4 g packet DISSOLVE AND DRINK 1 PACKET BY MOUTH AT BEDTIME 03/05/22  Yes Glean Hess, MD  rOPINIRole (REQUIP) 1 MG tablet Take 1 tablet (1 mg total) by mouth 3 (three) times daily. 02/13/22  Yes Glean Hess, MD  Torsemide 40 MG TABS Take 40 mg by mouth daily.   Yes [provider]  budesonide (PULMICORT) 0.5 MG/2ML nebulizer solution TAKE 2 ML (0.5 MG TOTAL) BY NEBULIZATION  TWICE A DAY Patient not taking: Reported on 06/08/2022 10/29/21   Tyler Pita, MD  feeding supplement (ENSURE ENLIVE / ENSURE PLUS) LIQD Take 237 mLs by mouth 3 (three) times daily between meals. 05/10/22   Lorella Nimrod, MD  metoCLOPramide (REGLAN) 5 MG tablet Take 1 tablet (5 mg total) by mouth 3 (three) times daily before meals. Patient not taking: Reported on 06/08/2022 05/12/22   Sharen Hones, MD  NON FORMULARY Bipap - nightly with Oxygen 2 Lpm    [provider]  oxyCODONE 7.5 MG TABS Take 7.5 mg by mouth every 6 (six) hours as needed. 05/10/22 05/10/23  Lorella Nimrod, MD  OXYGEN  Inhale into the lungs. 2 LPM [place]    [provider]  promethazine (PHENERGAN) 25 MG tablet Take 1 tablet (25 mg total) by mouth every 6 (six) hours as needed. Patient not taking: Reported on 06/08/2022 05/10/22 05/10/23  Lorella Nimrod, MD     Critical care time: 77 minutes    Donell Beers, [place] Pager [phone] (please enter 7 digits) PCCM Consult Pager [phone] (please enter 7 digits)

## 2022-06-19 DIAGNOSIS — R6520 Severe sepsis without septic shock: Secondary | ICD-10-CM | POA: Diagnosis not present

## 2022-06-19 DIAGNOSIS — A419 Sepsis, unspecified organism: Secondary | ICD-10-CM | POA: Diagnosis not present

## 2022-06-19 LAB — GLUCOSE, CAPILLARY
Glucose-Capillary: 110 mg/dL — ABNORMAL HIGH (ref 70–99)
Glucose-Capillary: 115 mg/dL — ABNORMAL HIGH (ref 70–99)
Glucose-Capillary: 119 mg/dL — ABNORMAL HIGH (ref 70–99)
Glucose-Capillary: 122 mg/dL — ABNORMAL HIGH (ref 70–99)
Glucose-Capillary: 129 mg/dL — ABNORMAL HIGH (ref 70–99)
Glucose-Capillary: 93 mg/dL (ref 70–99)

## 2022-06-19 LAB — CBC
HCT: 29 % — ABNORMAL LOW (ref 36.0–46.0)
Hemoglobin: 8.4 g/dL — ABNORMAL LOW (ref 12.0–15.0)
MCH: 24.1 pg — ABNORMAL LOW (ref 26.0–34.0)
MCHC: 29 g/dL — ABNORMAL LOW (ref 30.0–36.0)
MCV: 83.1 fL (ref 80.0–100.0)
Platelets: 191 10*3/uL (ref 150–400)
RBC: 3.49 MIL/uL — ABNORMAL LOW (ref 3.87–5.11)
RDW: 19.7 % — ABNORMAL HIGH (ref 11.5–15.5)
WBC: 11.3 10*3/uL — ABNORMAL HIGH (ref 4.0–10.5)
nRBC: 0.2 % (ref 0.0–0.2)

## 2022-06-19 LAB — RENAL FUNCTION PANEL
Albumin: 2.5 g/dL — ABNORMAL LOW (ref 3.5–5.0)
Anion gap: 8 (ref 5–15)
BUN: 123 mg/dL — ABNORMAL HIGH (ref 8–23)
CO2: 37 mmol/L — ABNORMAL HIGH (ref 22–32)
Calcium: 9.2 mg/dL (ref 8.9–10.3)
Chloride: 104 mmol/L (ref 98–111)
Creatinine, Ser: 1.54 mg/dL — ABNORMAL HIGH (ref 0.44–1.00)
GFR, Estimated: 36 mL/min — ABNORMAL LOW (ref >60)
Glucose, Bld: 138 mg/dL — ABNORMAL HIGH (ref 70–99)
Phosphorus: 3.3 mg/dL (ref 2.5–4.6)
Potassium: 3.9 mmol/L (ref 3.5–5.1)
Sodium: 149 mmol/L — ABNORMAL HIGH (ref 135–145)

## 2022-06-19 LAB — CULTURE, RESPIRATORY W GRAM STAIN

## 2022-06-19 LAB — MAGNESIUM: Magnesium: 2 mg/dL (ref 1.7–2.4)

## 2022-06-19 MED ORDER — FENTANYL CITRATE PF 50 MCG/ML IJ SOSY
12.5000 ug | PREFILLED_SYRINGE | INTRAMUSCULAR | Status: DC | PRN
  Administered 2022-06-20: 25 ug via INTRAVENOUS
  Filled 2022-06-19: qty 1

## 2022-06-19 MED ORDER — POLYETHYLENE GLYCOL 3350 17 G PO PACK: 17.0000 g | PACK | Freq: Every day | ORAL | Status: DC | PRN

## 2022-06-19 MED ORDER — DOCUSATE SODIUM 100 MG PO CAPS: 100.0000 mg | ORAL_CAPSULE | Freq: Two times a day (BID) | ORAL | Status: DC | PRN

## 2022-06-19 MED ORDER — FREE WATER
100.0000 mL | Status: DC
  Administered 2022-06-19: 100 mL

## 2022-06-19 MED ORDER — PHENOL 1.4 % MT LIQD
1.0000 | OROMUCOSAL | Status: DC | PRN
  Administered 2022-06-19 (×3): 1 via OROMUCOSAL
  Filled 2022-06-19 (×2): qty 177

## 2022-06-19 MED ORDER — ACETAMINOPHEN 10 MG/ML IV SOLN
1000.0000 mg | Freq: Once | INTRAVENOUS | Status: AC
  Administered 2022-06-19: 1000 mg via INTRAVENOUS
  Filled 2022-06-19: qty 100

## 2022-06-19 NOTE — Progress Notes (Signed)
Nutrition Follow Up Note   DOCUMENTATION CODES:   Morbid obesity  INTERVENTION:   RD will add supplements with diet advancement   MVI po daily with diet advancement   Vitamin C '500mg'$  po BID with diet advancement  NUTRITION DIAGNOSIS:   Inadequate oral intake related to inability to eat (pt sedated and ventilated) as evidenced by NPO status.  GOAL:   Patient will meet greater than or equal to 90% of their needs -previously met with tube feeds  MONITOR:   Diet advancement, Labs, Weight trends, Skin, I & O's  ASSESSMENT:   72 y/o female with h/o morbid obesity, OSA, HTN, COPD, CHF, CKD IV, GERD, IBS, RLS and recent admission for SBO (medically managed) who is admitted with CHF, sepsis, UTI, AKI and cellulitis.  Pt extubated today. RD will add supplements and vitamins with diet advancement. Pt drinking strawberry Ensure during her last admission. Pt with hypernatremia today. Pt remains on lasix drip. Per chart, pt is down ~15lbs from her UBW.   Medications reviewed and include:  colace, pepcid, heparin, miralax, cefepime, lasix   Labs reviewed: Na 149(H), K 3.9 wnl, BUN 123(H), creat 1.54(H), P 3.3 wnl, Mg 2.0 wnl Wbc- 11.3(H), Hgb 8.4(L), Hct 29.0(L) Cbgs- 119, 129, 122 x 24 hrs   Diet Order:    Diet Order             Diet NPO time specified  Diet effective now                  EDUCATION NEEDS:   No education needs have been identified at this time  Skin:  Skin Assessment: Reviewed RN Assessment (Intertriginous dermatitis to abdominal pannus and inguinal folds, ecchymosis)  Last BM:  1/31- type 7  Height:   Ht Readings from Last 1 Encounters:  06/09/22 '5\' 4"'$  (1.626 m)    Weight:   Wt Readings from Last 1 Encounters:  06/19/22 (!) 140.3 kg    Ideal Body Weight:  54.5 kg  BMI:  Body mass index is 53.09 kg/m.  Estimated Nutritional Needs:   Kcal:  2300-2600kcal/day  Protein:  115-130g/day  Fluid:  1.4-1.6L/day  Koleen Distance MS, RD,  LDN Please refer to [facility] for RD and/or RD on-call/weekend/after hours pager

## 2022-06-19 NOTE — Progress Notes (Signed)
Patient extubated. Spoke with Dr. Mortimer Fries regarding patients free water flushes due to her sodium level. At this time orders to hold as patient recently extubated and NPO. Orders to place a SLP consultation. Continue to assess.

## 2022-06-19 NOTE — Consult Note (Signed)
[place] for Electrolyte Monitoring and Replacement   Recent Labs: Potassium (mmol/L)  Date Value  06/19/2022 3.9  05/25/2014 3.6   Magnesium (mg/dL)  Date Value  06/19/2022 2.0   Calcium (mg/dL)  Date Value  06/19/2022 9.2   Calcium, Total (mg/dL)  Date Value  05/25/2014 8.6   Albumin (g/dL)  Date Value  06/19/2022 2.5 (L)  05/25/2014 3.2 (L)   Phosphorus (mg/dL)  Date Value  06/19/2022 3.3   Sodium (mmol/L)  Date Value  06/19/2022 149 (H)  02/13/2022 135  05/25/2014 141    Assessment: 72 y.o. female with PMHx significant for HFpEF, COPD, OSA (noncompliant with CPAP), CKD Stage III who is  admitted with Sepsis in setting of lower extremity Cellulitis, AKI, and  Acute on Chronic Hypercapnic Respiratory Failure in setting AECOPD. Pharmacy has been consulted to monitor and replace electrolytes while PCCM care.  Diuretics: furosemide infusion at 4 mg/hr   Nutrition: Vital at 60 mL/hr + FWF 30 mL q4h  Goal of Therapy:  Electrolytes WNL  Plan:  --No replacement warranted --Will recheck electrolytes with AM labs   Wynelle Cleveland, PharmD, BCPS Clinical Pharmacist 06/19/2022 8:03 AM

## 2022-06-19 NOTE — Procedures (Signed)
Extubation Procedure Note  Patient Details:   Name: ABENI FINCHUM DOB: 1951/01/29 MRN: [id number]   Airway Documentation:    Vent end date: 06/19/22 Vent end time: 1355   Evaluation  O2 sats: stable throughout Complications: No apparent complications Patient did tolerate procedure well. Bilateral Breath Sounds: Diminished, Clear   Yes able to speak with hoarse voice.  No cuff leak present prior to extubation.  MD aware and at bedside.  Extubated to [facility] with Bipap on standby.    Rayne Du Mahiya Kercheval 06/19/2022, 2:00 PM

## 2022-06-19 NOTE — Progress Notes (Signed)
[facility]  ROUNDING NOTE   Subjective:   Samantha Mason  is a 72 year old female with past medical history including COPD with renal failure requiring 2 L oxygen as needed, chronic diastolic heart failure, sleep apnea, morbid obesity, hypertension, and chronic kidney disease stage IV. Patient presents to the emergency department feeling sick for a few days.  She was admitted for Sepsis [facility]) [A41.9] Cellulitis, unspecified cellulitis site [L03.90] Community acquired pneumonia, unspecified laterality [J18.9] Sepsis, due to unspecified organism, unspecified whether acute organ dysfunction present [facility]) [A41.9]  Patient is followed by our office outpatient. She was last seen in office on Apr 17, 2022.   Patient remains critically ill.  Continues to have good urine output.  Serum sodium trended up a bit to 149. BUN also up to 123.  01/30 0701 - 01/31 0700 In: 2087.6 [I.V.:276.6; NG/GT:1611; IV Piggyback:200] Out: 5000 [Urine:4650; Stool:350] Lab Results  Component Value Date   CREATININE 1.54 (H) 06/19/2022   CREATININE 1.52 (H) 06/18/2022   CREATININE 1.60 (H) 06/17/2022      Objective:  Vital signs in last 24 hours:  Temp:  [97.5 F (36.4 C)-99.3 F (37.4 C)] 98.1 F (36.7 C) (01/31 0800) Pulse Rate:  [79-98] 85 (01/31 0800) Resp:  [17-33] 17 (01/31 0800) BP: (118-154)/(45-71) 154/54 (01/31 0800) SpO2:  [91 %-98 %] 96 % (01/31 0800) FiO2 (%):  [40 %] 40 % (01/31 0740) Weight:  [140.3 kg] 140.3 kg (01/31 0353)  Weight change: -6.9 kg Filed Weights   06/17/22 0429 06/18/22 0500 06/19/22 0353  Weight: (!) 152.5 kg (!) 147.2 kg (!) 140.3 kg    Intake/Output: I/O last 3 completed shifts: In: 3214.9 [I.V.:383.9; NG/GT:2631; IV [id number]:235] Out: 5732 [KGURK:2706; Stool:400]   Intake/Output this shift:  Total I/O In: -  Out: 700 [Urine:700]  Physical Exam: General: Critically ill-appearing  Head: Normocephalic, atraumatic.  Endotracheal tube in  place  Eyes: Anicteric  Lungs:  Intubated on vent, coarse breath sounds bilateral  Heart: Regular rate and rhythm  Abdomen:  Soft, nontender  Extremities: 2+ peripheral edema.  Neurologic: Arousable  Skin: No lesions  Access: No hemodialysis access    Basic Metabolic Panel: Recent Labs  Lab 06/14/22 0404 06/15/22 0404 06/16/22 0400 06/17/22 0406 06/18/22 0423 06/19/22 0343  NA 139 140 142 145 146* 149*  K 3.5 4.1 4.2 4.5 3.6 3.9  CL 101 101 101 100 101 104  CO2 26 29 32 32 35* 37*  GLUCOSE 144* 194* 192* 179* 130* 138*  BUN 70* 77* 88* 101* 116* 123*  CREATININE 2.06* 1.87* 1.62* 1.60* 1.52* 1.54*  CALCIUM 8.9 8.5* 8.8* 8.8* 8.9 9.2  MG 2.1 1.9  --  1.5* 2.2 2.0  PHOS 3.6 1.9* 2.6 2.8 2.6 3.3     Liver Function Tests: Recent Labs  Lab 06/15/22 0404 06/16/22 0400 06/17/22 0406 06/18/22 0423 06/19/22 0343  ALBUMIN 2.4* 2.5* 2.5* 2.6* 2.5*    No results for input(s): "LIPASE", "AMYLASE" in the last 168 hours. Recent Labs  Lab 06/13/22 0421  AMMONIA 20     CBC: Recent Labs  Lab 06/14/22 0404 06/15/22 0404 06/16/22 0400 06/17/22 0406 06/18/22 0423 06/19/22 0343  WBC 15.8*  16.1* 14.0* 25.1* 11.7* 10.9* 11.3*  NEUTROABS 13.4*  --   --   --   --   --   HGB 8.9*  9.0* 8.7* 9.0* 8.4* 8.6* 8.4*  HCT 30.7*  30.3* 28.5* 30.8* 28.1* 29.0* 29.0*  MCV 81.9  81.9 79.2* 81.5 80.3 81.7  83.1  PLT 310  319 220 271 194 202 191     Cardiac Enzymes: No results for input(s): "CKTOTAL", "CKMB", "CKMBINDEX", "TROPONINI" in the last 168 hours.  BNP: Invalid input(s): "POCBNP"  CBG: Recent Labs  Lab 06/18/22 1933 06/18/22 1950 06/18/22 2355 06/19/22 0357 06/19/22 0744  GLUCAP 122* 132* 140* 122* 46*     Microbiology: Results for orders placed or performed during the hospital encounter of 06/08/22  Blood Culture (routine x 2)     Status: None   Collection Time: 06/08/22  7:18 PM   Specimen: BLOOD  Result Value Ref Range Status   Specimen  Description BLOOD BLOOD LEFT ARM  Final   Special Requests   Final    BOTTLES DRAWN AEROBIC AND ANAEROBIC Blood Culture adequate volume   Culture   Final    NO GROWTH 5 DAYS Performed at [facility], [address], [place], [place] [address]    Report Status 06/13/2022 FINAL  Final  Blood Culture (routine x 2)     Status: None   Collection Time: 06/08/22  7:18 PM   Specimen: BLOOD  Result Value Ref Range Status   Specimen Description BLOOD BLOOD RIGHT ARM  Final   Special Requests   Final    BOTTLES DRAWN AEROBIC AND ANAEROBIC Blood Culture adequate volume   Culture   Final    NO GROWTH 5 DAYS Performed at [facility], [address] [place],  [address]    Report Status 06/13/2022 FINAL  Final  Resp panel by RT-PCR (RSV, Flu A&B, Covid) Anterior Nasal Swab     Status: None   Collection Time: 06/08/22  7:18 PM   Specimen: Anterior Nasal Swab  Result Value Ref Range Status   SARS Coronavirus 2 by RT PCR NEGATIVE NEGATIVE Final    Comment: (NOTE) SARS-CoV-2 target nucleic acids are NOT DETECTED.  The SARS-CoV-2 RNA is generally detectable in upper respiratory specimens during the acute phase of infection. The lowest concentration of SARS-CoV-2 viral copies this assay can detect is 138 copies/mL. A negative result does not preclude SARS-Cov-2 infection and should not be used as the sole basis for treatment or other patient management decisions. A negative result may occur with  improper specimen collection/handling, submission of specimen other than nasopharyngeal swab, presence of viral mutation(s) within the areas targeted by this assay, and inadequate number of viral copies(<138 copies/mL). A negative result must be combined with clinical observations, patient history, and epidemiological information. The expected result is Negative.  Fact Sheet for Patients:  EntrepreneurPulse.com.au  Fact Sheet for Healthcare Providers:   IncredibleEmployment.be  This test is no t yet approved or cleared by the [place] FDA and  has been authorized for detection and/or diagnosis of SARS-CoV-2 by FDA under an Emergency Use Authorization (EUA). This EUA will remain  in effect (meaning this test can be used) for the duration of the COVID-19 declaration under Section 564(b)(1) of the Act, 21 U.S.C.section 360bbb-3(b)(1), unless the authorization is terminated  or revoked sooner.       Influenza A by PCR NEGATIVE NEGATIVE Final   Influenza B by PCR NEGATIVE NEGATIVE Final    Comment: (NOTE) The Xpert Xpress SARS-CoV-2/FLU/RSV plus assay is intended as an aid in the diagnosis of influenza from Nasopharyngeal swab specimens and should not be used as a sole basis for treatment. Nasal washings and aspirates are unacceptable for Xpert Xpress SARS-CoV-2/FLU/RSV testing.  Fact Sheet for Patients: EntrepreneurPulse.com.au  Fact Sheet for Healthcare  Providers: IncredibleEmployment.be  This test is not yet approved or cleared by the [place] and has been authorized for detection and/or diagnosis of SARS-CoV-2 by FDA under an Emergency Use Authorization (EUA). This EUA will remain in effect (meaning this test can be used) for the duration of the COVID-19 declaration under Section 564(b)(1) of the Act, 21 U.S.C. section 360bbb-3(b)(1), unless the authorization is terminated or revoked.     Resp Syncytial Virus by PCR NEGATIVE NEGATIVE Final    Comment: (NOTE) Fact Sheet for Patients: EntrepreneurPulse.com.au  Fact Sheet for Healthcare Providers: IncredibleEmployment.be  This test is not yet approved or cleared by the [place] FDA and has been authorized for detection and/or diagnosis of SARS-CoV-2 by FDA under an Emergency Use Authorization (EUA). This EUA will remain in effect (meaning this test can be used) for  the duration of the COVID-19 declaration under Section 564(b)(1) of the Act, 21 U.S.C. section 360bbb-3(b)(1), unless the authorization is terminated or revoked.  Performed at [facility], [address], [place], [place] [address]   Urine Culture     Status: None   Collection Time: 06/08/22  9:34 PM   Specimen: In/Out Cath Urine  Result Value Ref Range Status   Specimen Description   Final    IN/OUT CATH URINE Performed at [facility], [address] [place], [place] [address]    Special Requests   Final    NONE Performed at [facility], [address], [place], [place] [address]    Culture   Final    NO GROWTH Performed at [facility], [place] [address], [place], [place] [address]    Report Status 06/10/2022 FINAL  Final  MRSA Next Gen by PCR, Nasal     Status: None   Collection Time: 06/10/22 11:49 PM   Specimen: Nasal Mucosa; Nasal Swab  Result Value Ref Range Status   MRSA by PCR Next Gen NOT DETECTED NOT DETECTED Final    Comment: (NOTE) The GeneXpert MRSA Assay (FDA approved for NASAL specimens only), is one component of a comprehensive MRSA colonization surveillance program. It is not intended to diagnose MRSA infection nor to guide or monitor treatment for MRSA infections. Test performance is not FDA approved in patients less than 81 years old. Performed at [facility], [place], [place] [address]   Respiratory (~20 pathogens) panel by PCR     Status: None   Collection Time: 06/12/22  6:15 AM   Specimen: Nasopharyngeal Swab; Respiratory  Result Value Ref Range Status   Adenovirus NOT DETECTED NOT DETECTED Final   Coronavirus 229E NOT DETECTED NOT DETECTED Final    Comment: (NOTE) The Coronavirus on the Respiratory Panel, DOES NOT test for the novel  Coronavirus (2019 nCoV)    Coronavirus HKU1 NOT DETECTED NOT DETECTED Final   Coronavirus NL63 NOT DETECTED NOT DETECTED Final   Coronavirus OC43  NOT DETECTED NOT DETECTED Final   Metapneumovirus NOT DETECTED NOT DETECTED Final   Rhinovirus / Enterovirus NOT DETECTED NOT DETECTED Final   Influenza A NOT DETECTED NOT DETECTED Final   Influenza B NOT DETECTED NOT DETECTED Final   Parainfluenza Virus 1 NOT DETECTED NOT DETECTED Final   Parainfluenza Virus 2 NOT DETECTED NOT DETECTED Final   Parainfluenza Virus 3 NOT DETECTED NOT DETECTED Final   Parainfluenza Virus 4 NOT DETECTED NOT DETECTED Final   Respiratory Syncytial Virus NOT DETECTED NOT DETECTED Final   Bordetella pertussis NOT DETECTED NOT  DETECTED Final   Bordetella Parapertussis NOT DETECTED NOT DETECTED Final   Chlamydophila pneumoniae NOT DETECTED NOT DETECTED Final   Mycoplasma pneumoniae NOT DETECTED NOT DETECTED Final    Comment: Performed at [facility], [place] [address], [place], [place] [address]  Culture, Respiratory w Gram Stain     Status: None (Preliminary result)   Collection Time: 06/17/22  8:15 AM   Specimen: Tracheal Aspirate; Respiratory  Result Value Ref Range Status   Specimen Description   Final    TRACHEAL ASPIRATE Performed at [facility], [address], [place], [place] [address]    Special Requests   Final    NONE Performed at [facility], [address] [place], [place] [address]    Gram Stain   Final    RARE WBC PRESENT, PREDOMINANTLY PMN RARE SQUAMOUS EPITHELIAL CELLS PRESENT FEW YEAST    Culture   Final    FEW YEAST IDENTIFICATION TO FOLLOW Performed at [facility], [place] [address], [place], [place] [address]    Report Status PENDING  Incomplete    Coagulation Studies: No results for input(s): "LABPROT", "INR" in the last 72 hours.  Urinalysis: No results for input(s): "COLORURINE", "LABSPEC", "PHURINE", "GLUCOSEU", "HGBUR", "BILIRUBINUR", "KETONESUR", "PROTEINUR", "UROBILINOGEN", "NITRITE", "LEUKOCYTESUR" in the last 72 hours.  Invalid input(s): "APPERANCEUR"    Imaging: No results  found.   Medications:    sodium chloride Stopped (06/17/22 2305)   ceFEPime (MAXIPIME) IV 2 g (06/19/22 0229)   dexmedetomidine (PRECEDEX) IV infusion 0.3 mcg/kg/hr (06/19/22 0600)   feeding supplement (VITAL HIGH PROTEIN) 60 mL/hr at 06/19/22 0600   fentaNYL infusion INTRAVENOUS Stopped (06/17/22 0438)   furosemide (LASIX) 200 mg in dextrose 5 % 100 mL (2 mg/mL) infusion 4 mg/hr (06/19/22 0600)    artificial tears  1 Application Both Eyes V3X   budesonide (PULMICORT) nebulizer solution  0.5 mg Nebulization BID   Chlorhexidine Gluconate Cloth  6 each Topical Daily   docusate  100 mg Per Tube BID   famotidine  20 mg Per Tube BID   fentaNYL (SUBLIMAZE) injection  25 mcg Intravenous Once   fluocinonide-emollient   Topical QODAY   free water  100 mL Per Tube Q4H   heparin  5,000 Units Subcutaneous Q8H   insulin aspart  0-20 Units Subcutaneous Q4H   ipratropium-albuterol  3 mL Nebulization Q6H   nutrition supplement (JUVEN)  1 packet Per Tube BID BM   mouth rinse  15 mL Mouth Rinse Q2H   polyethylene glycol  17 g Per Tube Daily   rOPINIRole  1 mg Per Tube TID   benzonatate, fentaNYL (SUBLIMAZE) injection, ipratropium-albuterol, midazolam, ondansetron **OR** ondansetron (ZOFRAN) IV  Assessment/ Plan:  Samantha Mason is a 72 y.o.  female  with past medical history including COPD with renal failure requiring 2 L oxygen as needed, chronic diastolic heart failure, sleep apnea, morbid obesity, hypertension, and chronic kidney disease stage IV. Patient presents to the emergency department feeling sick and was admitted for Sepsis [facility]) [A41.9] Cellulitis, unspecified cellulitis site [L03.90] Community acquired pneumonia, unspecified laterality [J18.9] Sepsis, due to unspecified organism, unspecified whether acute organ dysfunction present [facility]) [A41.9]   Acute Kidney Injury on chronic kidney disease stage IIIb with baseline creatinine 1.55 and GFR of 36 on 04/17/22.  Acute kidney  injury secondary to severe infection. No IV contrast exposure.  Diuretics held. Lower extremity edema present, however patient has history of lymphedema. Chest xray questionable to pulmonary edema.  Creatinine stable at 1.5.  BUN rising and currently 123.  Serum sodium also bit high at 149.  Continue Lasix drip 1 additional day with plans to hold this tomorrow.  We are attempting to keep the patient as dry as possible to aid in weaning from the ventilator.   Lab Results  Component Value Date   CREATININE 1.54 (H) 06/19/2022   CREATININE 1.52 (H) 06/18/2022   CREATININE 1.60 (H) 06/17/2022    Intake/Output Summary (Last 24 hours) at 06/19/2022 1056 Last data filed at 06/19/2022 0837 Gross per 24 hour  Intake 1688.22 ml  Output 4650 ml  Net -2961.78 ml    2. Anemia of chronic kidney disease Lab Results  Component Value Date   HGB 8.4 (L) 06/19/2022    No need for erythropoietin stimulating agents immediately.  May need to consider this over the course of the hospitalization.  3.  Chronic diastolic heart failure.  Echo from 07/16/21 shows EF 55 to 60% with mild LVH.  Consider stopping Lasix drip tomorrow.  4.  Acute respiratory failure.  Patient remains on vent support.  Currently on pressure support.  Hopefully can be weaned off ventilator today.   LOS: 11 Ardian Haberland 1/31/202410:56 AM

## 2022-06-19 NOTE — IPAL (Signed)
  Interdisciplinary Goals of Care Family Meeting   Date carried out: 06/19/2022  Location of the meeting: Bedside  Member's involved: Physician, Nurse Practitioner, and Family Member or next of kin  Durable Power of Attorney or acting medical decision maker: Pt's son [name]  Discussion: We discussed goals of care for Samantha Mason .  Ms. Thunder was extubated earlier today, respiratory status is much improved, on HHFNC, and  is awake and alert, coherent and able to answer questions.  We discussed hospital course requiring intubation for several days.  Inquired about if pt's respiratory status were to decline again, would she want to be placed on vent and mechanical ventilation again.  Pt able to voice that she WOULD NOT WANT TO BE INTUBATED AND PLACED ON MECHANICAL VENTILATION AGAIN.  Pt's son at bedside and witnessed pt making her wishes known.  Code status to remain DNR/DNI, will continue current treatment measures, and pt should wear BiPAP qhs and anytime while sleeping.  Code status:   Code Status: DNR   Disposition: Continue current acute care  Time spent for the meeting: 10 minutes   Darel Hong, AGACNP-BC [place] epic messenger for cross cover needs If after hours, please call E-link  Bradly Bienenstock, NP  06/19/2022, 5:31 PM

## 2022-06-19 NOTE — Progress Notes (Signed)
Pt currently in PSV 15/5 and tolerating, very comfortable, awake and alert, following all commands, nodding appropriately to questions.    Has been treated for AECOPD and Acute CHF, continues on Lasix gtt, with 4.6 L of UOP over preceding 24 hrs, and is net negative 3.5 L.  Pt appears optimized for extubation.  Pt nods that she would like to come off the breathing tube, and does not want to wait for her family to arrive to remove it. Called and updated pt's son Briselda Naval, who states he will be here around 1:30 or 2:00, but he is ok with proceeding with extubation per his mother's wishes.   PSV decreased to 8/8, pt continues to tolerate well.  RR 22, TV ranging from 360 cc to 500 cc.  Most recent RSBI score of 61, indicative of successful weaning predicted.  Will assess for cuff leak.  Discussed with Dr. Mortimer Fries, he is in agreement with extubation if cuff leak present.   Plan to  extubate to BiPAP for short trial then wean to [facility].    Darel Hong, AGACNP-BC [facility] Prefer epic messenger for cross cover needs If after hours, please call E-link

## 2022-06-19 NOTE — Progress Notes (Signed)
NAME:  Samantha Mason, MRN:  [id number], DOB:  01/10/1951, LOS: 54 ADMISSION DATE:  06/08/2022, CONSULTATION DATE:  06/11/2022 REFERRING MD:  Dr. Manuella Ghazi, CHIEF COMPLAINT:  Altered Mental Status, Hypercapnia  Brief Pt Description / Synopsis:  72 y.o. female with PMHx significant for HFpEF, COPD, OSA (noncompliant with CPAP), CKD Stage III who is  admitted with Sepsis in setting of lower extremity Cellulitis, AKI, and  Acute on Chronic Hypercapnic Respiratory Failure in setting AECOPD.  Course complicated by CO2 narcosis, failed BiPAP requiring intubation and mechanical ventilation.  History of Present Illness:  Samantha Mason is a 72 y.o. female with medical history significant for Morbid obesity, chronic HFpEF,CKD stage III, chronic lower extremity lymphedema on treatment with torsemide, hypertension, asthma, stroke (1995) and obstructive sleep apnea(non compliant with cpap) who presented to [facility] ED on 06/08/22 due to shortness of breath and generalized malaise for appropriately 1 week.  Pt is currently altered and no family currently available, therefore history is obtained from chart review.  Per ED and nursing notes, she reported about a 1 week history of feeling weak and fatigued, chills, bilateral lower extremity swelling and erythema (R>L), paroxsymal nocturnal dyspnea and orthopnea, weight gain with increased oxygen requirements.  She denied abdominal pain, N/V/D, dysuria.  ED Course: Initial Vital Signs: 105F orally, BP 129/53, HR 109, RR 27, SpO2 96% Significant Labs: BUN 39, Creatinine 1.59, Albumin 2.4, lactic acid 1.1, WBC 12.9, Hgb 8.0, Hct 27.7, Hgb A1c 5.8 COVID/FLU/RSV negative UA negative for UTI Imaging Chest X-ray>>IMPRESSION: Mild cardiomegaly with central pulmonary vascular congestion. CT Chest/Abdomen/Pelvis>> IMPRESSION: 1. Skin thickening and soft tissue edema of the partially imaged proximal right thigh. Correlate for cellulitis. 2. Newly enlarged bilateral inguinal  and iliac lymph nodes, likely reactive. 3. Diffuse bilateral bronchial wall thickening and mosaic attenuation of the airspaces, consistent with nonspecific infectious or inflammatory bronchitis. 4. Cardiomegaly.  She met Sepsis criteria, therefore she received IV fluids and antibiotics. Hospitalists were asked to admit for further workup and treatment os sepsis due to cellulitis.  Please see "significant hospital events" section below for full detailed hospital course.  Pertinent  Medical History   Past Medical History:  Diagnosis Date   Acute bronchitis    Anemia    Arthritis    Asthma    B12 deficiency 03/14/2017   Chest pain 07/02/2018   Chest pain    a. 06/2018 MV: EF 66%. No ischemia/infarct; b. 02/2021 PET CT: EF>60%, no ischemia/infarct.   Chronic heart failure with preserved ejection fraction (HFpEF) ([place])    a. 08/2020 Echo: EF 55-60%, GrI DD; b. 06/2021 Echo: EF 55-60%, mild LVH, nl RV fxn, no signif valvular dzs.   COPD (chronic obstructive pulmonary disease) ([place]) 07/24/2018   Family history of blood clots 07/16/2017   Irritable bowel syndrome    Malignant hypertension    Obesity    OSA (obstructive sleep apnea)    uses cpap   Pneumonia 2017   Restless leg syndrome    Stroke ([place]) 1995   mild     Micro Data:  1/20: SARS-CoV-2/influenza/RSV PCR>> negative 1/20: Blood culture x 2>> no growth  1/20: Urine>> negative 1/22: MRSA PCR>>negative  1/24: Respiratory viral panel>>negative  1/26: Tracheal aspirate>> not collected 1/29: Tracheal aspirate>>  Antimicrobials:   Anti-infectives (From admission, onward)    Start     Dose/Rate Route Frequency Ordered Stop   06/14/22 1400  ceFEPIme (MAXIPIME) 2 g in sodium chloride 0.9 % 100 mL IVPB  2 g 200 mL/hr over 30 Minutes Intravenous Every 12 hours 06/14/22 1301 06/21/22 1359   06/13/22 1000  ceFEPIme (MAXIPIME) 2 g in sodium chloride 0.9 % 100 mL IVPB  Status:  Discontinued        2 g 200 mL/hr over 30  Minutes Intravenous Every 24 hours 06/12/22 1430 06/13/22 0747   06/13/22 0900  cefTRIAXone (ROCEPHIN) 2 g in sodium chloride 0.9 % 100 mL IVPB  Status:  Discontinued        2 g 200 mL/hr over 30 Minutes Intravenous Every 24 hours 06/13/22 0747 06/14/22 1230   06/11/22 1145  vancomycin (VANCOREADY) IVPB 2000 mg/400 mL        2,000 mg 200 mL/hr over 120 Minutes Intravenous  Once 06/11/22 1047 06/11/22 1344   06/11/22 1145  ceFEPIme (MAXIPIME) 2 g in sodium chloride 0.9 % 100 mL IVPB  Status:  Discontinued        2 g 200 mL/hr over 30 Minutes Intravenous 2 times daily 06/11/22 1047 06/12/22 1430   06/10/22 2300  vancomycin (VANCOCIN) IVPB 1000 mg/200 mL premix  Status:  Discontinued       See Hyperspace for full Linked Orders Report.   1,000 mg 200 mL/hr over 60 Minutes Intravenous Every 48 hours 06/08/22 2307 06/09/22 0922   06/10/22 2300  vancomycin (VANCOREADY) IVPB 1250 mg/250 mL  Status:  Discontinued       See Hyperspace for full Linked Orders Report.   1,250 mg 166.7 mL/hr over 90 Minutes Intravenous Every 48 hours 06/08/22 2307 06/09/22 0922   06/10/22 1800  cephALEXin (KEFLEX) capsule 500 mg  Status:  Discontinued        500 mg Oral Every 6 hours 06/10/22 1521 06/11/22 1047   06/09/22 2200  vancomycin (VANCOREADY) IVPB 750 mg/150 mL  Status:  Discontinued        750 mg 150 mL/hr over 60 Minutes Intravenous Every 24 hours 06/09/22 0923 06/10/22 1521   06/09/22 1000  ceFEPIme (MAXIPIME) 2 g in sodium chloride 0.9 % 100 mL IVPB  Status:  Discontinued        2 g 200 mL/hr over 30 Minutes Intravenous Every 12 hours 06/09/22 0916 06/10/22 1521   06/08/22 2300  vancomycin (VANCOCIN) IVPB 1000 mg/200 mL premix  Status:  Discontinued        1,000 mg 200 mL/hr over 60 Minutes Intravenous  Once 06/08/22 2257 06/08/22 2307   06/08/22 2215  vancomycin (VANCOCIN) IVPB 1000 mg/200 mL premix       See Hyperspace for full Linked Orders Report.   1,000 mg 200 mL/hr over 60 Minutes Intravenous   Once 06/08/22 2210 06/09/22 0014   06/08/22 2215  vancomycin (VANCOREADY) IVPB 1500 mg/300 mL       See Hyperspace for full Linked Orders Report.   1,500 mg 150 mL/hr over 120 Minutes Intravenous  Once 06/08/22 2210 06/10/22 0807   06/08/22 1915  piperacillin-tazobactam (ZOSYN) IVPB 3.375 g        3.375 g 100 mL/hr over 30 Minutes Intravenous  Once 06/08/22 1909 06/08/22 [place] [facility] Events: Including procedures, antibiotic start and stop dates in addition to other pertinent events   1/20: Presented to ED, admitted by [facility] 1/21: Added cefepime, continue vancomycin, increase dose of Cardizem for better heart rate control 1/22: Had to reduce Cardizem dose back to her home dose as blood pressure dropped 1/23: Transferred to ICU overnight due to CO2 narcosis, placed  on BiPAP.  PCCM consulted.  Worsening AKI, holding diuresis.  Agitation requiring Precedex, high risk for Intubation.  Palliative Care consulted for [place] conversations.  ABX broadened back to Cefepime and Vancomycin 1/24: Pt on Bipap but awake and able to follow commands.  Will attempt to transition off Bipap.  Palliative Care following along to assist with goals of care conversations  1/25: Pt currently on Bipap and precedex gtt.  Required 2.5 mg iv valium overnight for delirium.  Unable to obtain CT Head due to respiratory status.  Ultimately required intubation. 1/26: Remains critically ill. Issues with vent dyschrony and ventilation, plan to start Nimbex gtt. Creatinine improved with diuresis, continue with diuresis, Nephrology considering Lasix gtt. 1/27: ABG's improved on Nimbex, persistent cuff leak, exchanged ETT. Decadron started due to upper airway swelling. Will attempt to d/c Nimbex. Good diuresis with Lasix gtt with 4.5 L of UOP last 24 hrs, Creatinine improving. 1/28: Continues with good diuresis on Lasix gtt, 3L of UOP over preceding 24 hrs, creatinine continues to improve.  Vent requirements slowly  improving. 1/29: No acute events overnight. Remains mechanically intubated with minimal vent settings: PEEP 5/FiO2 40%.  Remains on lasix gtt '@2mg'$ /hr.  Not on sedation and attempting to follow commands but extremely weak.  Will perform SBT as tolerated   1/30: No acute events overnight.  Pt awake and following commands on precedex gtt '@0'$ .2 mcg/kg/hr.  Lasix gtt '@4'$  mg/hr. Tolerating SBT 15/8 1/31: No acute events, awake and following commands on low dose precedex.  Remains on Lasix gtt @ '4mg'$ /hr,  Creatinine remains stable BUN slowly uptrending. Tolerating SBT 15/8 ~ will continue to wean PSV as tolerated  Interim History / Subjective:  -No significant events noted overnight -Afebrile, hemodynamically stable, no vasopressors -On PSV 15/8 and tolerating ~ will continue to wean in attempt for possible extubation when family arrives at bedside -Calm and awake, following commands on low dose precedex -Remains on Lasix gtt ~ UOP 4.6 L preceding 24 hrs (net - 3.5L), BUN increased to 123 from 116 and with mild Hypernatremia of 149 ~ discuss with Nephrology who would like to continue Lasix gtt for now to help with extubation -Tracheal aspirate results from 1/29 still pending  Objective   Blood pressure (!) 145/60, pulse 79, temperature 97.9 F (36.6 C), resp. rate (!) 21, height '5\' 4"'$  (1.626 m), weight (!) 140.3 kg, SpO2 95 %.    Vent Mode: PSV;CPAP FiO2 (%):  [40 %] 40 % Set Rate:  [20 bmp] 20 bmp Vt Set:  [450 mL] 450 mL PEEP:  [5 cmH20-6 cmH20] 5 cmH20 Pressure Support:  [10 cmH20-15 cmH20] 15 cmH20   Intake/Output Summary (Last 24 hours) at 06/19/2022 0756 Last data filed at 06/19/2022 0600 Gross per 24 hour  Intake 2087.56 ml  Output 5000 ml  Net -2912.44 ml    Filed Weights   06/17/22 0429 06/18/22 0500 06/19/22 0353  Weight: (!) 152.5 kg (!) 147.2 kg (!) 140.3 kg    Examination: General: Acute on chronically ill-appearing obese female, laying in bed, intubated and lightly sedated, in  no acute distress HENT: Supple, difficult to assess JVD due to body habitus  Lungs: Distant clear breath sounds throughout, even, non labored, overbreathes the vent  Cardiovascular: NSR, s1s2, no m/r/g, 2+ radial/1+ distal pulses, no edema  Abdomen: +BS x4, obese, non distended, non tender  Extremities: 1+ generalized edema, bilateral LE lymphedema with unna boots in place Neuro: Awake (RASS 0), following commands, no focal deficits noted, PERRL  GU: Indwelling foley catheter draining clear yellow urine  Skin: Cellulitis to bilateral lower extremities, yeast excoriation to abdominal fold/groins  Resolved Hospital Problem list     Assessment & Plan:   #Acute on Chronic Hypercapnic Respiratory Failure in setting of AECOPD & acute CHF exacerbation vs ARDS PMHx: OSA (noncompliant with CPAP at home), morbid obesity with probable OHS - Full vent support, implement lung protective strategies - Plateau pressures less than 30 cm H20 - Wean FiO2 & PEEP as tolerated to maintain O2 sats 88 to 92% - Follow intermittent Chest X-ray & ABG as needed - Spontaneous Breathing Trials when respiratory parameters met and mental status permits - Implement VAP Bundle - Bronchodilators & Pulmicort nebs - ABX as above - Diuresis as BP and renal function permits ~ currently on Lasix gtt as per Nephrology - Pulmonary toilet as able  #Acute on Chronic HFpEF #Hypotension ~ RESOLVED PMHx: HTN Echocardiogram 06/12/22: LVEF 60-65%, Grade III DD, RV systolic function normal, mild to moderate MR - Continuous cardiac monitoring - Maintain MAP >65 - Vasopressors as needed to maintain MAP goal ~ WEANED OFF - Diuresis as BP and renal function permits to keep even~ Continue Lasix gtt as per Nephrology - Nephrology following, appreciate input  #Sepsis secondary to Cellulitis of Lower extremities (Meets SIRS Criteria: HR >90, RR >20, fever) - Monitor fever curve - Trend WBC's & Procalcitonin - Follow respiratory  culture - Continue empiric Cefepime pending cultures & sensitivities  #Acute Kidney Injury on CKD III ~ IMPROVING - Trend BMP  - Replace electrolytes as indicated  - Monitor UOP - Avoid nephrotoxic medication as able  - Nephrology following, appreciate input   #Anemia of Chronic Disease - Trend CBC - Monitor for s/sx of bleeding  - Transfuse for hgb <7  #Acute Metabolic Encephalopathy in the setting of CO2 Narcosis and multiple metabolic derangements ~ IMPROVING #Sedation needs in setting of Mechanical Ventilation CT Head 1/25 negative for acute intracranial abnormality EEG 1/26 negative for seizures - Maintain a RASS goal of 0 to -1  - Prn precedex gtt to maintain RASS goal  - Avoid sedating medications as able - Daily wake up assessment    Pt is critically ill with multiorgan failure superimposed on multiple chronic co morbidities and morbid obesity.  High risk for further decompensation, cardiac arrest and death.  Anticipate she will be difficult to liberate from the ventilator, and may likely need Tracheotomy. She is DNR status, and family at bedside reports that pt WOULD NOT WANT a TRACHEOSTOMY, and they do not wish to pursue trach or feeding tube.  They wish to give her the 14 days of aggressive treatment and intubation and assess her response.  Palliative Care following to assist with goals of care.  Best Practice (right click and "Reselect all SmartList Selections" daily)   Diet/type: NPO, tube feeds DVT prophylaxis: prophylactic heparin  GI prophylaxis: PPI Lines: Left IJ, and is still needed Foley:  foley catheter, and is still needed Code Status:  DNR Last date of multidisciplinary goals of care discussion [06/19/22]  1/31: Will update pts family when they arrive at bedside   CBC: Recent Labs  Lab 06/14/22 0404 06/15/22 0404 06/16/22 0400 06/17/22 0406 06/18/22 0423 06/19/22 0343  WBC 15.8*  16.1* 14.0* 25.1* 11.7* 10.9* 11.3*  NEUTROABS 13.4*  --   --    --   --   --   HGB 8.9*  9.0* 8.7* 9.0* 8.4* 8.6* 8.4*  HCT 30.7*  30.3* 28.5*  30.8* 28.1* 29.0* 29.0*  MCV 81.9  81.9 79.2* 81.5 80.3 81.7 83.1  PLT 310  319 220 271 194 202 191     Basic Metabolic Panel: Recent Labs  Lab 06/14/22 0404 06/15/22 0404 06/16/22 0400 06/17/22 0406 06/18/22 0423 06/19/22 0343  NA 139 140 142 145 146* 149*  K 3.5 4.1 4.2 4.5 3.6 3.9  CL 101 101 101 100 101 104  CO2 26 29 32 32 35* 37*  GLUCOSE 144* 194* 192* 179* 130* 138*  BUN 70* 77* 88* 101* 116* 123*  CREATININE 2.06* 1.87* 1.62* 1.60* 1.52* 1.54*  CALCIUM 8.9 8.5* 8.8* 8.8* 8.9 9.2  MG 2.1 1.9  --  1.5* 2.2 2.0  PHOS 3.6 1.9* 2.6 2.8 2.6 3.3    GFR: Estimated Creatinine Clearance: 47.7 mL/min (A) (by C-G formula based on SCr of 1.54 mg/dL (H)). Recent Labs  Lab 06/14/22 0404 06/15/22 0404 06/16/22 0400 06/17/22 0406 06/18/22 0423 06/19/22 0343  PROCALCITON 0.28 0.37 0.27 0.20  --   --   WBC 15.8*  16.1* 14.0* 25.1* 11.7* 10.9* 11.3*     Liver Function Tests: Recent Labs  Lab 06/15/22 0404 06/16/22 0400 06/17/22 0406 06/18/22 0423 06/19/22 0343  ALBUMIN 2.4* 2.5* 2.5* 2.6* 2.5*    No results for input(s): "LIPASE", "AMYLASE" in the last 168 hours. Recent Labs  Lab 06/13/22 0421  AMMONIA 20     ABG    Component Value Date/Time   PHART 7.43 06/17/2022 1026   PCO2ART 18 (LL) 06/17/2022 1026   PO2ART 149 (H) 06/17/2022 1026   HCO3 11.9 (L) 06/17/2022 1026   ACIDBASEDEF 9.8 (H) 06/17/2022 1026   O2SAT 99.1 06/17/2022 1026     Coagulation Profile: No results for input(s): "INR", "PROTIME" in the last 168 hours.   Cardiac Enzymes: No results for input(s): "CKTOTAL", "CKMB", "CKMBINDEX", "TROPONINI" in the last 168 hours.  HbA1C: Hgb A1c MFr Bld  Date/Time Value Ref Range Status  06/08/2022 07:16 AM 5.8 (H) 4.8 - 5.6 % Final    Comment:    (NOTE) Pre diabetes:          5.7%-6.4%  Diabetes:              >6.4%  Glycemic control for   <7.0% adults  with diabetes   07/15/2021 04:30 PM 5.4 4.8 - 5.6 % Final    Comment:    (NOTE) Pre diabetes:          5.7%-6.4%  Diabetes:              >6.4%  Glycemic control for   <7.0% adults with diabetes     CBG: Recent Labs  Lab 06/18/22 1933 06/18/22 1950 06/18/22 2355 06/19/22 0357 06/19/22 0744  GLUCAP 122* 132* 140* 122* 129*     Review of Systems:   Unable to assess due to AMS/intubation/sedation  Past Medical History:  She,  has a past medical history of Acute bronchitis, Anemia, Arthritis, Asthma, B12 deficiency (03/14/2017), Chest pain (07/02/2018), Chest pain, Chronic heart failure with preserved ejection fraction (HFpEF) ([place]), COPD (chronic obstructive pulmonary disease) ([place]) (07/24/2018), Family history of blood clots (07/16/2017), Irritable bowel syndrome, Malignant hypertension, Obesity, OSA (obstructive sleep apnea), Pneumonia (2017), Restless leg syndrome, and Stroke ([place]) (1995).   Surgical History:   Past Surgical History:  Procedure Laterality Date   CHOLECYSTECTOMY     COLONOSCOPY WITH PROPOFOL N/A 03/31/2018   Procedure: COLONOSCOPY WITH PROPOFOL;  Surgeon: Lucilla Lame, MD;  Location: [facility] ENDOSCOPY;  Service:  Endoscopy;  Laterality: N/A;   KNEE ARTHROPLASTY Right 04/30/2019   Procedure: COMPUTER ASSISTED TOTAL KNEE ARTHROPLASTY;  Surgeon: Dereck Leep, MD;  Location: [facility] ORS;  Service: Orthopedics;  Laterality: Right;   RIGHT HEART CATH N/A 01/30/2022   Procedure: RIGHT HEART CATH;  Surgeon: Nelva Bush, MD;  Location: [place] [facility] LAB;  Service: Cardiovascular;  Laterality: N/A;   saliva gland removal  1998   TOTAL KNEE ARTHROPLASTY Left 2014   TUBAL LIGATION Bilateral      Social History:   reports that she has never smoked. She has never used smokeless tobacco. She reports that she does not drink alcohol and does not use drugs.   Family History:  Her family history includes COPD in her father; Coronary artery disease (age of onset: 21)  in her brother; Dementia in her mother; Diabetes in her father; Heart failure in her brother and maternal grandmother; Stroke in her maternal grandmother. There is no history of Breast cancer.   Allergies Allergies  Allergen Reactions   Nuvigil [Armodafinil] Hives   Penicillins Diarrhea and Nausea And Vomiting    Did it involve swelling of the face/tongue/throat, SOB, or low BP? no Did it involve sudden or severe rash/hives, skin peeling, or any reaction on the inside of your mouth or nose? No Did you need to seek medical attention at a hospital or doctor's office? No When did it last happen?  in her 42s    If all above answers are "NO", may proceed with cephalosporin use.    Spironolactone Itching   Gabapentin Other (See Comments)    "wiped her out, couldn't stay awake"   Melatonin Other (See Comments)    "Brain fog"   Provigil [Modafinil] Hives   Entresto [Sacubitril-Valsartan] Itching   Lisinopril Cough     Home Medications  Prior to Admission medications   Medication Sig Start Date End Date Taking? Authorizing Provider  acetaminophen (TYLENOL) 500 MG tablet Take 1,000 mg by mouth every 6 (six) hours as needed.   Yes [provider]  benzonatate (TESSALON) 100 MG capsule TAKE 1 CAPSULE BY MOUTH THREE TIMES A DAY AS NEEDED FOR COUGH 03/08/22  Yes Tyler Pita, MD  calcitRIOL (ROCALTROL) 0.25 MCG capsule Take 0.25 mcg by mouth daily. 02/22/22  Yes [provider]  dapagliflozin propanediol (FARXIGA) 10 MG TABS tablet Take 1 tablet (10 mg total) by mouth daily before breakfast. 03/07/22  Yes Hackney, Tina A, FNP  diltiazem (CARDIZEM CD) 240 MG 24 hr capsule Take 1 capsule (240 mg total) by mouth daily. 09/05/21  Yes Theora Gianotti, NP  ipratropium-albuterol (DUONEB) 0.5-2.5 (3) MG/3ML SOLN TAKE 3 MLS BY NEBULIZATION EVERY 6 (SIX) HOURS AS NEEDED. J45.40 03/04/22  Yes Tyler Pita, MD  metolazone (ZAROXOLYN) 2.5 MG tablet Take 1 tablet (2.5 mg  total) by mouth 2 (two) times a week. Patient taking differently: Take 2.5 mg by mouth 2 (two) times a week. Monday and Friday 02/04/22  Yes Wouk, Ailene Rud, MD  ondansetron (ZOFRAN) 4 MG tablet Take 1 tablet (4 mg total) by mouth daily as needed for nausea or vomiting. 05/10/22 05/10/23 Yes Lorella Nimrod, MD  pantoprazole (PROTONIX) 40 MG tablet TAKE 1 TABLET BY MOUTH EVERY DAY 12/11/21  Yes Glean Hess, MD  PREVALITE 4 g packet DISSOLVE AND DRINK 1 PACKET BY MOUTH AT BEDTIME 03/05/22  Yes Glean Hess, MD  rOPINIRole (REQUIP) 1 MG tablet Take 1 tablet (1 mg total) by mouth 3 (three)  times daily. 02/13/22  Yes Glean Hess, MD  Torsemide 40 MG TABS Take 40 mg by mouth daily.   Yes [provider]  budesonide (PULMICORT) 0.5 MG/2ML nebulizer solution TAKE 2 ML (0.5 MG TOTAL) BY NEBULIZATION TWICE A DAY Patient not taking: Reported on 06/08/2022 10/29/21   Tyler Pita, MD  feeding supplement (ENSURE ENLIVE / ENSURE PLUS) LIQD Take 237 mLs by mouth 3 (three) times daily between meals. 05/10/22   Lorella Nimrod, MD  metoCLOPramide (REGLAN) 5 MG tablet Take 1 tablet (5 mg total) by mouth 3 (three) times daily before meals. Patient not taking: Reported on 06/08/2022 05/12/22   Sharen Hones, MD  NON FORMULARY Bipap - nightly with Oxygen 2 Lpm    [provider]  oxyCODONE 7.5 MG TABS Take 7.5 mg by mouth every 6 (six) hours as needed. 05/10/22 05/10/23  Lorella Nimrod, MD  OXYGEN Inhale into the lungs. 2 LPM [place]    [provider]  promethazine (PHENERGAN) 25 MG tablet Take 1 tablet (25 mg total) by mouth every 6 (six) hours as needed. Patient not taking: Reported on 06/08/2022 05/10/22 05/10/23  Lorella Nimrod, MD     Critical care time: 40 minutes    Darel Hong, AGACNP-BC [facility] Prefer epic messenger for cross cover needs If after hours, please call E-link

## 2022-06-19 NOTE — Progress Notes (Signed)
SLP Cancellation Note  Patient Details Name: Samantha Mason MRN: [id number] DOB: 19-Jan-1951   Cancelled treatment:       Reason Eval/Treat Not Completed: Patient not medically ready (received order, reviewed chart notes.)  Pt is known to this service from an initial eval order placed on 06/13/2022 for a BSE, however, pt was orally intubated d/t respiratory decline at that same time and order was canceled.  In setting of pt s extubation at ~2pm ttoday and her lengthy oral intubation, will Hold on BSE until tomorrow.  Recommend frequent oral care for hygiene and stimulation of swallowing currently. ST services will f/u tomorrow. NSG updated.     Orinda Kenner, MS, CCC-SLP Speech Language Pathologist Rehab Services; [place] [phone] (ascom) Emonte Dieujuste 06/19/2022, 2:16 PM

## 2022-06-20 ENCOUNTER — Inpatient Hospital Stay: Payer: Medicare Other

## 2022-06-20 ENCOUNTER — Telehealth: Payer: Self-pay | Admitting: Internal Medicine

## 2022-06-20 DIAGNOSIS — R6520 Severe sepsis without septic shock: Secondary | ICD-10-CM | POA: Diagnosis not present

## 2022-06-20 DIAGNOSIS — A419 Sepsis, unspecified organism: Secondary | ICD-10-CM | POA: Diagnosis not present

## 2022-06-20 LAB — RENAL FUNCTION PANEL
Albumin: 2.7 g/dL — ABNORMAL LOW (ref 3.5–5.0)
Anion gap: 10 (ref 5–15)
BUN: 109 mg/dL — ABNORMAL HIGH (ref 8–23)
CO2: 39 mmol/L — ABNORMAL HIGH (ref 22–32)
Calcium: 9.3 mg/dL (ref 8.9–10.3)
Chloride: 106 mmol/L (ref 98–111)
Creatinine, Ser: 1.48 mg/dL — ABNORMAL HIGH (ref 0.44–1.00)
GFR, Estimated: 38 mL/min — ABNORMAL LOW (ref >60)
Glucose, Bld: 129 mg/dL — ABNORMAL HIGH (ref 70–99)
Phosphorus: 4.4 mg/dL (ref 2.5–4.6)
Potassium: 3.5 mmol/L (ref 3.5–5.1)
Sodium: 155 mmol/L — ABNORMAL HIGH (ref 135–145)

## 2022-06-20 LAB — MAGNESIUM: Magnesium: 2.2 mg/dL (ref 1.7–2.4)

## 2022-06-20 LAB — CBC
HCT: 30.1 % — ABNORMAL LOW (ref 36.0–46.0)
Hemoglobin: 8.4 g/dL — ABNORMAL LOW (ref 12.0–15.0)
MCH: 24.1 pg — ABNORMAL LOW (ref 26.0–34.0)
MCHC: 27.9 g/dL — ABNORMAL LOW (ref 30.0–36.0)
MCV: 86.5 fL (ref 80.0–100.0)
Platelets: 191 10*3/uL (ref 150–400)
RBC: 3.48 MIL/uL — ABNORMAL LOW (ref 3.87–5.11)
RDW: 19.8 % — ABNORMAL HIGH (ref 11.5–15.5)
WBC: 12.5 10*3/uL — ABNORMAL HIGH (ref 4.0–10.5)
nRBC: 0 % (ref 0.0–0.2)

## 2022-06-20 LAB — GLUCOSE, CAPILLARY
Glucose-Capillary: 109 mg/dL — ABNORMAL HIGH (ref 70–99)
Glucose-Capillary: 112 mg/dL — ABNORMAL HIGH (ref 70–99)
Glucose-Capillary: 119 mg/dL — ABNORMAL HIGH (ref 70–99)
Glucose-Capillary: 120 mg/dL — ABNORMAL HIGH (ref 70–99)
Glucose-Capillary: 123 mg/dL — ABNORMAL HIGH (ref 70–99)
Glucose-Capillary: 95 mg/dL (ref 70–99)

## 2022-06-20 MED ORDER — DEXTROSE 5 % IV SOLN: INTRAVENOUS | Status: DC

## 2022-06-20 MED ORDER — FENTANYL CITRATE PF 50 MCG/ML IJ SOSY
25.0000 ug | PREFILLED_SYRINGE | Freq: Once | INTRAMUSCULAR | Status: AC
  Administered 2022-06-20: 25 ug via INTRAVENOUS
  Filled 2022-06-20: qty 1

## 2022-06-20 MED ORDER — PANTOPRAZOLE SODIUM 40 MG IV SOLR
40.0000 mg | INTRAVENOUS | Status: DC
  Administered 2022-06-20 – 2022-06-24 (×5): 40 mg via INTRAVENOUS
  Filled 2022-06-20 (×5): qty 10

## 2022-06-20 MED ORDER — ORAL CARE MOUTH RINSE: 15.0000 mL | OROMUCOSAL | Status: DC

## 2022-06-20 MED ORDER — ACETAMINOPHEN 10 MG/ML IV SOLN
1000.0000 mg | Freq: Four times a day (QID) | INTRAVENOUS | Status: DC | PRN
  Administered 2022-06-20: 1000 mg via INTRAVENOUS
  Filled 2022-06-20: qty 100

## 2022-06-20 MED ORDER — ORAL CARE MOUTH RINSE: 15.0000 mL | OROMUCOSAL | Status: DC | PRN

## 2022-06-20 MED ORDER — FENTANYL CITRATE PF 50 MCG/ML IJ SOSY
25.0000 ug | PREFILLED_SYRINGE | INTRAMUSCULAR | Status: DC | PRN
  Administered 2022-06-20: 50 ug via INTRAVENOUS
  Filled 2022-06-20: qty 1

## 2022-06-20 NOTE — Telephone Encounter (Signed)
Copied from [place] [id number]. Topic: General - Other >> Jun 20, 2022  1:05 PM Ja-Kwan M wrote: Reason for CRM: Anderson Malta with [facility] reports that the paperwork that was sent to them includes another patients information listed so this will be denied by insurance. Anderson Malta stated she re-faxed the paperwork so it could be corrected. Cb# [phone]

## 2022-06-20 NOTE — Consult Note (Signed)
[place] for Electrolyte Monitoring and Replacement   Recent Labs: Potassium (mmol/L)  Date Value  06/20/2022 3.5  05/25/2014 3.6   Magnesium (mg/dL)  Date Value  06/20/2022 2.2   Calcium (mg/dL)  Date Value  06/20/2022 9.3   Calcium, Total (mg/dL)  Date Value  05/25/2014 8.6   Albumin (g/dL)  Date Value  06/20/2022 2.7 (L)  05/25/2014 3.2 (L)   Phosphorus (mg/dL)  Date Value  06/20/2022 4.4   Sodium (mmol/L)  Date Value  06/20/2022 155 (H)  02/13/2022 135  05/25/2014 141    Assessment: 72 y.o. female with PMHx significant for HFpEF, COPD, OSA (noncompliant with CPAP), CKD Stage III who is  admitted with Sepsis in setting of lower extremity Cellulitis, AKI, and  Acute on Chronic Hypercapnic Respiratory Failure in setting AECOPD. Pharmacy has been consulted to monitor and replace electrolytes while PCCM care.  Diuretics: furosemide infusion at 4 mg/hr   Nutrition: Vital at 60 mL/hr + FWF 30 mL q4h  Goal of Therapy:  Electrolytes WNL  Plan:  --No replacement warranted --Na 155. D5W @ 50 mL/hr as ordered by medical team. Continue to monitor.  --Will recheck electrolytes with AM labs   Wynelle Cleveland, PharmD, BCPS Clinical Pharmacist 06/20/2022 8:00 AM

## 2022-06-20 NOTE — TOC Progression Note (Signed)
Transition of Care [facility]) - Progression Note    Patient Details  Name: Samantha Mason MRN: [id number] Date of Birth: 10-17-1950  Transition of Care [facility]) CM/SW Contact  Shelbie Hutching, RN Phone Number: 06/20/2022, 4:10 PM  Clinical Narrative:    Talked with patient at the bedside, patient nods head in understanding that she is aware of [facility] in [place]- explained the purpose of LTACH and that she would be a good candidate for them.  Patient shakes head no that she is not interested.  TOC will cont to follow.    Expected Discharge Plan:  (TBD) Barriers to Discharge: Continued Medical Work up  Expected Discharge Plan and Services   Discharge Planning Services: CM Consult   Living arrangements for the past 2 months: Single Family Home                                       Social Determinants of Health (SDOH) Interventions SDOH Screenings   Food Insecurity: No Food Insecurity (06/09/2022)  Housing: Low Risk  (06/09/2022)  Transportation Needs: No Transportation Needs (06/09/2022)  Utilities: Not At Risk (06/09/2022)  Alcohol Screen: Low Risk  (08/08/2021)  Depression (PHQ2-9): Medium Risk (02/25/2022)  Financial Resource Strain: Medium Risk (08/14/2021)  Physical Activity: Inactive (08/08/2021)  Social Connections: Socially Isolated (08/08/2021)  Stress: No Stress Concern Present (08/08/2021)  Tobacco Use: Low Risk  (06/11/2022)    Readmission Risk Interventions     No data to display

## 2022-06-20 NOTE — Progress Notes (Signed)
NAME:  Samantha Mason, MRN:  [id number], DOB:  27-Mar-1951, LOS: 44 ADMISSION DATE:  06/08/2022, CONSULTATION DATE:  06/11/2022 REFERRING MD:  Dr. Manuella Ghazi, CHIEF COMPLAINT:  Altered Mental Status, Hypercapnia  Brief Pt Description / Synopsis:  72 y.o. female with PMHx significant for HFpEF, COPD, OSA (noncompliant with CPAP), CKD Stage III who is  admitted with Sepsis in setting of lower extremity Cellulitis, AKI, and  Acute on Chronic Hypercapnic Respiratory Failure in setting AECOPD.  Course complicated by CO2 narcosis, failed BiPAP requiring intubation and mechanical ventilation.  History of Present Illness:  Samantha Mason is a 72 y.o. female with medical history significant for Morbid obesity, chronic HFpEF,CKD stage III, chronic lower extremity lymphedema on treatment with torsemide, hypertension, asthma, stroke (1995) and obstructive sleep apnea(non compliant with cpap) who presented to [facility] ED on 06/08/22 due to shortness of breath and generalized malaise for appropriately 1 week.  Pt is currently altered and no family currently available, therefore history is obtained from chart review.  Per ED and nursing notes, she reported about a 1 week history of feeling weak and fatigued, chills, bilateral lower extremity swelling and erythema (R>L), paroxsymal nocturnal dyspnea and orthopnea, weight gain with increased oxygen requirements.  She denied abdominal pain, N/V/D, dysuria.  ED Course: Initial Vital Signs: 105F orally, BP 129/53, HR 109, RR 27, SpO2 96% Significant Labs: BUN 39, Creatinine 1.59, Albumin 2.4, lactic acid 1.1, WBC 12.9, Hgb 8.0, Hct 27.7, Hgb A1c 5.8 COVID/FLU/RSV negative UA negative for UTI Imaging Chest X-ray>>IMPRESSION: Mild cardiomegaly with central pulmonary vascular congestion. CT Chest/Abdomen/Pelvis>> IMPRESSION: 1. Skin thickening and soft tissue edema of the partially imaged proximal right thigh. Correlate for cellulitis. 2. Newly enlarged bilateral inguinal  and iliac lymph nodes, likely reactive. 3. Diffuse bilateral bronchial wall thickening and mosaic attenuation of the airspaces, consistent with nonspecific infectious or inflammatory bronchitis. 4. Cardiomegaly.  She met Sepsis criteria, therefore she received IV fluids and antibiotics. Hospitalists were asked to admit for further workup and treatment os sepsis due to cellulitis.  Please see "significant hospital events" section below for full detailed hospital course.  Pertinent  Medical History   Past Medical History:  Diagnosis Date   Acute bronchitis    Anemia    Arthritis    Asthma    B12 deficiency 03/14/2017   Chest pain 07/02/2018   Chest pain    a. 06/2018 MV: EF 66%. No ischemia/infarct; b. 02/2021 PET CT: EF>60%, no ischemia/infarct.   Chronic heart failure with preserved ejection fraction (HFpEF) ([place])    a. 08/2020 Echo: EF 55-60%, GrI DD; b. 06/2021 Echo: EF 55-60%, mild LVH, nl RV fxn, no signif valvular dzs.   COPD (chronic obstructive pulmonary disease) ([place]) 07/24/2018   Family history of blood clots 07/16/2017   Irritable bowel syndrome    Malignant hypertension    Obesity    OSA (obstructive sleep apnea)    uses cpap   Pneumonia 2017   Restless leg syndrome    Stroke ([place]) 1995   mild     Micro Data:  1/20: SARS-CoV-2/influenza/RSV PCR>> negative 1/20: Blood culture x 2>> no growth  1/20: Urine>> negative 1/22: MRSA PCR>>negative  1/24: Respiratory viral panel>>negative  1/26: Tracheal aspirate>> not collected 1/29: Tracheal aspirate>> few candida albicans  Antimicrobials:   Anti-infectives (From admission, onward)    Start     Dose/Rate Route Frequency Ordered Stop   06/14/22 1400  ceFEPIme (MAXIPIME) 2 g in sodium chloride 0.9 % 100 mL IVPB  2 g 200 mL/hr over 30 Minutes Intravenous Every 12 hours 06/14/22 1301 06/21/22 1359   06/13/22 1000  ceFEPIme (MAXIPIME) 2 g in sodium chloride 0.9 % 100 mL IVPB  Status:  Discontinued        2  g 200 mL/hr over 30 Minutes Intravenous Every 24 hours 06/12/22 1430 06/13/22 0747   06/13/22 0900  cefTRIAXone (ROCEPHIN) 2 g in sodium chloride 0.9 % 100 mL IVPB  Status:  Discontinued        2 g 200 mL/hr over 30 Minutes Intravenous Every 24 hours 06/13/22 0747 06/14/22 1230   06/11/22 1145  vancomycin (VANCOREADY) IVPB 2000 mg/400 mL        2,000 mg 200 mL/hr over 120 Minutes Intravenous  Once 06/11/22 1047 06/11/22 1344   06/11/22 1145  ceFEPIme (MAXIPIME) 2 g in sodium chloride 0.9 % 100 mL IVPB  Status:  Discontinued        2 g 200 mL/hr over 30 Minutes Intravenous 2 times daily 06/11/22 1047 06/12/22 1430   06/10/22 2300  vancomycin (VANCOCIN) IVPB 1000 mg/200 mL premix  Status:  Discontinued       See Hyperspace for full Linked Orders Report.   1,000 mg 200 mL/hr over 60 Minutes Intravenous Every 48 hours 06/08/22 2307 06/09/22 0922   06/10/22 2300  vancomycin (VANCOREADY) IVPB 1250 mg/250 mL  Status:  Discontinued       See Hyperspace for full Linked Orders Report.   1,250 mg 166.7 mL/hr over 90 Minutes Intravenous Every 48 hours 06/08/22 2307 06/09/22 0922   06/10/22 1800  cephALEXin (KEFLEX) capsule 500 mg  Status:  Discontinued        500 mg Oral Every 6 hours 06/10/22 1521 06/11/22 1047   06/09/22 2200  vancomycin (VANCOREADY) IVPB 750 mg/150 mL  Status:  Discontinued        750 mg 150 mL/hr over 60 Minutes Intravenous Every 24 hours 06/09/22 0923 06/10/22 1521   06/09/22 1000  ceFEPIme (MAXIPIME) 2 g in sodium chloride 0.9 % 100 mL IVPB  Status:  Discontinued        2 g 200 mL/hr over 30 Minutes Intravenous Every 12 hours 06/09/22 0916 06/10/22 1521   06/08/22 2300  vancomycin (VANCOCIN) IVPB 1000 mg/200 mL premix  Status:  Discontinued        1,000 mg 200 mL/hr over 60 Minutes Intravenous  Once 06/08/22 2257 06/08/22 2307   06/08/22 2215  vancomycin (VANCOCIN) IVPB 1000 mg/200 mL premix       See Hyperspace for full Linked Orders Report.   1,000 mg 200 mL/hr over 60  Minutes Intravenous  Once 06/08/22 2210 06/09/22 0014   06/08/22 2215  vancomycin (VANCOREADY) IVPB 1500 mg/300 mL       See Hyperspace for full Linked Orders Report.   1,500 mg 150 mL/hr over 120 Minutes Intravenous  Once 06/08/22 2210 06/10/22 0807   06/08/22 1915  piperacillin-tazobactam (ZOSYN) IVPB 3.375 g        3.375 g 100 mL/hr over 30 Minutes Intravenous  Once 06/08/22 1909 06/08/22 [place] [facility] Events: Including procedures, antibiotic start and stop dates in addition to other pertinent events   1/20: Presented to ED, admitted by [facility] 1/21: Added cefepime, continue vancomycin, increase dose of Cardizem for better heart rate control 1/22: Had to reduce Cardizem dose back to her home dose as blood pressure dropped 1/23: Transferred to ICU overnight due to CO2 narcosis, placed  on BiPAP.  PCCM consulted.  Worsening AKI, holding diuresis.  Agitation requiring Precedex, high risk for Intubation.  Palliative Care consulted for [place] conversations.  ABX broadened back to Cefepime and Vancomycin 1/24: Pt on Bipap but awake and able to follow commands.  Will attempt to transition off Bipap.  Palliative Care following along to assist with goals of care conversations  1/25: Pt currently on Bipap and precedex gtt.  Required 2.5 mg iv valium overnight for delirium.  Unable to obtain CT Head due to respiratory status.  Ultimately required intubation. 1/26: Remains critically ill. Issues with vent dyschrony and ventilation, plan to start Nimbex gtt. Creatinine improved with diuresis, continue with diuresis, Nephrology considering Lasix gtt. 1/27: ABG's improved on Nimbex, persistent cuff leak, exchanged ETT. Decadron started due to upper airway swelling. Will attempt to d/c Nimbex. Good diuresis with Lasix gtt with 4.5 L of UOP last 24 hrs, Creatinine improving. 1/28: Continues with good diuresis on Lasix gtt, 3L of UOP over preceding 24 hrs, creatinine continues to improve.  Vent  requirements slowly improving. 1/29: No acute events overnight. Remains mechanically intubated with minimal vent settings: PEEP 5/FiO2 40%.  Remains on lasix gtt '@2mg'$ /hr.  Not on sedation and attempting to follow commands but extremely weak.  Will perform SBT as tolerated   1/30: No acute events overnight.  Pt awake and following commands on precedex gtt '@0'$ .2 mcg/kg/hr.  Lasix gtt '@4'$  mg/hr. Tolerating SBT 15/8 1/31: No acute events, awake and following commands on low dose precedex.  Remains on Lasix gtt @ '4mg'$ /hr,  Creatinine remains stable BUN slowly uptrending. Tolerating SBT 15/8 ~ will continue to wean PSV as tolerated ~ EXTUBATED. Lasix gtt d/c following extubation.  Following extubation, pt confirmed DNR/DNI status in presence of her son at bedside. 2/1: Tolerated BiPAP overnight, weaned to [facility] this am.  Speech evaluation performed, which pt FAILED. Consult PT/OT.  Creatinine continues to improve, started on D5W @ 50 ml/hr due to Hypernatremia of 155.  Holding diuresis today  Interim History / Subjective:  -Extubated yesterday afternoon ~ tolerated well ~ BiPAP qhs and now weaned to 50% HHFNC this am -Afebrile, hemodynamically stable, no vasopressors -Lasix gtt stopped yesterday following extubation ~UOP 4L over the preceding 24 hrs (net - 7.2L) ~ Nephrology following, plans to hold diuresis today -Creatinine and BUN improved, D5W @ 50 cc/hr started for worsening Hypernatremia of 155 from 149 -Speech evaluation performed by Speech Therapy ~ pt FAILED, keep NPO for now -Consult PT/OT to mobilize as able -Aggressive pulmonary toilet  Objective   Blood pressure (!) 158/53, pulse 93, temperature 98.1 F (36.7 C), temperature source Axillary, resp. rate 19, height '5\' 4"'$  (1.626 m), weight (!) 144.2 kg, SpO2 96 %.    Vent Mode: PSV;CPAP FiO2 (%):  [40 %-60 %] 50 % PEEP:  [5 cmH20] 5 cmH20 Pressure Support:  [10 cmH20-15 cmH20] 10 cmH20   Intake/Output Summary (Last 24 hours) at 06/20/2022  0723 Last data filed at 06/20/2022 4132 Gross per 24 hour  Intake 616.74 ml  Output 4350 ml  Net -3733.26 ml    Filed Weights   06/18/22 0500 06/19/22 0353 06/20/22 0500  Weight: (!) 147.2 kg (!) 140.3 kg (!) 144.2 kg    Examination: General: Acute on chronically ill-appearing obese female, laying in bed,on HHFNC, in no acute distress HENT: Supple, difficult to assess JVD due to body habitus  Lungs: Distant breath sounds with mild expiratory wheezing throughout, even, non labored, normal effort  Cardiovascular: NSR, s1s2,  no m/r/g, 2+ radial/1+ distal pulses, no edema  Abdomen: +BS x4, obese, non distended, non tender  Extremities: 1+ generalized edema, bilateral LE lymphedema with unna boots in place Neuro: Awake and alert, oriented to person and place, following commands, no focal deficits noted, PERRL GU: Indwelling foley catheter draining clear yellow urine  Skin: Cellulitis to bilateral lower extremities, yeast excoriation to abdominal fold/groins  Resolved Hospital Problem list     Assessment & Plan:   #Acute on Chronic Hypercapnic Respiratory Failure in setting of AECOPD & acute CHF exacerbation vs ARDS PMHx: OSA (noncompliant with CPAP at home), morbid obesity with probable OHS EXTUBATED 06/19/22 -Supplemental O2 as needed to maintain O2 sats 88 to 92% -BiPAP, wean as tolerated ~ BiPAP qhs  (pt confirms DNR/DNI) -Follow intermittent Chest X-ray & ABG as needed -Bronchodilators & Pulmicort nebs -Completed course of IV Steroids -ABX as above -Diuresis as BP and renal function permits ~ holding 2/1 -Aggressive Pulmonary toilet as able -PT/OT consulted to mobilize as tolerated  #Acute on Chronic HFpEF #Hypotension ~ RESOLVED PMHx: HTN Echocardiogram 06/12/22: LVEF 60-65%, Grade III DD, RV systolic function normal, mild to moderate MR - Continuous cardiac monitoring - Maintain MAP >65 - Vasopressors as needed to maintain MAP goal ~ WEANED OFF - Diuresis as BP and renal  function permits to keep even~ Holding diuresis 2/1 - Nephrology following, appreciate input  #Sepsis secondary to Cellulitis of Lower extremities (Meets SIRS Criteria: HR >90, RR >20, fever) - Monitor fever curve - Trend WBC's & Procalcitonin - Follow respiratory culture - Continue empiric Cefepime pending cultures & sensitivities (plan to complete on 06/21/22)  #Acute Kidney Injury on CKD III ~ IMPROVING #Hypernatremia ~ WORSENED - Trend BMP  - Replace electrolytes as indicated  - Monitor UOP - Avoid nephrotoxic medication as able  - Nephrology following, appreciate input  -Continue D5W @ 50 cc/hr, holding diuresis 2/1  #Anemia of Chronic Disease - Trend CBC - Monitor for s/sx of bleeding  - Transfuse for hgb <7  #Acute Metabolic Encephalopathy in the setting of CO2 Narcosis and multiple metabolic derangements ~ RESOLVED CT Head 1/25 negative for acute intracranial abnormality EEG 1/26 negative for seizures -Treatment of metabolic derangements and hypercapnia as outlined above -Provide supportive care -Promote normal sleep/wake cycle and family presence -Avoid sedating meds as able     Best Practice (right click and "Reselect all SmartList Selections" daily)   Diet/type: NPO, SPEECH EVAL PENDING DVT prophylaxis: prophylactic heparin  GI prophylaxis: PPI Lines: Left IJ, and is still needed Foley:  foley catheter, and is still needed Code Status:  DNR/DNI Last date of multidisciplinary goals of care discussion [06/20/22]  2/1:  Pt updated at bedside, will update her son when he arrives at bedside.  CBC: Recent Labs  Lab 06/14/22 0404 06/15/22 0404 06/16/22 0400 06/17/22 0406 06/18/22 0423 06/19/22 0343 06/20/22 0444  WBC 15.8*  16.1*   < > 25.1* 11.7* 10.9* 11.3* 12.5*  NEUTROABS 13.4*  --   --   --   --   --   --   HGB 8.9*  9.0*   < > 9.0* 8.4* 8.6* 8.4* 8.4*  HCT 30.7*  30.3*   < > 30.8* 28.1* 29.0* 29.0* 30.1*  MCV 81.9  81.9   < > 81.5 80.3 81.7 83.1  86.5  PLT 310  319   < > 271 194 202 191 191   < > = values in this interval not displayed.     Basic Metabolic  Panel: Recent Labs  Lab 06/15/22 0404 06/16/22 0400 06/17/22 0406 06/18/22 0423 06/19/22 0343 06/20/22 0444  NA 140 142 145 146* 149* 155*  K 4.1 4.2 4.5 3.6 3.9 3.5  CL 101 101 100 101 104 106  CO2 29 32 32 35* 37* 39*  GLUCOSE 194* 192* 179* 130* 138* 129*  BUN 77* 88* 101* 116* 123* 109*  CREATININE 1.87* 1.62* 1.60* 1.52* 1.54* 1.48*  CALCIUM 8.5* 8.8* 8.8* 8.9 9.2 9.3  MG 1.9  --  1.5* 2.2 2.0 2.2  PHOS 1.9* 2.6 2.8 2.6 3.3 4.4    GFR: Estimated Creatinine Clearance: 50.5 mL/min (A) (by C-G formula based on SCr of 1.48 mg/dL (H)). Recent Labs  Lab 06/14/22 0404 06/15/22 0404 06/16/22 0400 06/17/22 0406 06/18/22 0423 06/19/22 0343 06/20/22 0444  PROCALCITON 0.28 0.37 0.27 0.20  --   --   --   WBC 15.8*  16.1* 14.0* 25.1* 11.7* 10.9* 11.3* 12.5*     Liver Function Tests: Recent Labs  Lab 06/16/22 0400 06/17/22 0406 06/18/22 0423 06/19/22 0343 06/20/22 0444  ALBUMIN 2.5* 2.5* 2.6* 2.5* 2.7*    No results for input(s): "LIPASE", "AMYLASE" in the last 168 hours. No results for input(s): "AMMONIA" in the last 168 hours.   ABG    Component Value Date/Time   PHART 7.43 06/17/2022 1026   PCO2ART 18 (LL) 06/17/2022 1026   PO2ART 149 (H) 06/17/2022 1026   HCO3 11.9 (L) 06/17/2022 1026   ACIDBASEDEF 9.8 (H) 06/17/2022 1026   O2SAT 99.1 06/17/2022 1026     Coagulation Profile: No results for input(s): "INR", "PROTIME" in the last 168 hours.   Cardiac Enzymes: No results for input(s): "CKTOTAL", "CKMB", "CKMBINDEX", "TROPONINI" in the last 168 hours.  HbA1C: Hgb A1c MFr Bld  Date/Time Value Ref Range Status  06/08/2022 07:16 AM 5.8 (H) 4.8 - 5.6 % Final    Comment:    (NOTE) Pre diabetes:          5.7%-6.4%  Diabetes:              >6.4%  Glycemic control for   <7.0% adults with diabetes   07/15/2021 04:30 PM 5.4 4.8 - 5.6 %  Final    Comment:    (NOTE) Pre diabetes:          5.7%-6.4%  Diabetes:              >6.4%  Glycemic control for   <7.0% adults with diabetes     CBG: Recent Labs  Lab 06/19/22 1121 06/19/22 1510 06/19/22 1942 06/19/22 2351 06/20/22 0410  GLUCAP 119* 110* 115* 93 119*     Review of Systems:   Positives in BOLD: Gen: Denies fever, chills, weight change, fatigue, night sweats HEENT: Denies blurred vision, double vision, hearing loss, tinnitus, sinus congestion, rhinorrhea, sore throat, neck stiffness, dysphagia PULM: Denies shortness of breath, cough, sputum production, hemoptysis, wheezing CV: Denies chest pain, edema, orthopnea, paroxysmal nocturnal dyspnea, palpitations GI: Denies abdominal pain, nausea, vomiting, diarrhea, hematochezia, melena, constipation, change in bowel habits GU: Denies dysuria, hematuria, polyuria, oliguria, urethral discharge Endocrine: Denies hot or cold intolerance, polyuria, polyphagia or appetite change Derm: Denies rash, dry skin, scaling or peeling skin change Heme: Denies easy bruising, bleeding, bleeding gums Neuro: Denies headache, numbness, weakness, slurred speech, loss of memory or consciousness   Past Medical History:  She,  has a past medical history of Acute bronchitis, Anemia, Arthritis, Asthma, B12 deficiency (03/14/2017), Chest pain (07/02/2018), Chest pain, Chronic heart  failure with preserved ejection fraction (HFpEF) ([place]), COPD (chronic obstructive pulmonary disease) ([place]) (07/24/2018), Family history of blood clots (07/16/2017), Irritable bowel syndrome, Malignant hypertension, Obesity, OSA (obstructive sleep apnea), Pneumonia (2017), Restless leg syndrome, and Stroke ([place]) (1995).   Surgical History:   Past Surgical History:  Procedure Laterality Date   CHOLECYSTECTOMY     COLONOSCOPY WITH PROPOFOL N/A 03/31/2018   Procedure: COLONOSCOPY WITH PROPOFOL;  Surgeon: Lucilla Lame, MD;  Location: [facility] ENDOSCOPY;  Service:  Endoscopy;  Laterality: N/A;   KNEE ARTHROPLASTY Right 04/30/2019   Procedure: COMPUTER ASSISTED TOTAL KNEE ARTHROPLASTY;  Surgeon: Dereck Leep, MD;  Location: [facility] ORS;  Service: Orthopedics;  Laterality: Right;   RIGHT HEART CATH N/A 01/30/2022   Procedure: RIGHT HEART CATH;  Surgeon: Nelva Bush, MD;  Location: [place] CV LAB;  Service: Cardiovascular;  Laterality: N/A;   saliva gland removal  1998   TOTAL KNEE ARTHROPLASTY Left 2014   TUBAL LIGATION Bilateral      Social History:   reports that she has never smoked. She has never used smokeless tobacco. She reports that she does not drink alcohol and does not use drugs.   Family History:  Her family history includes COPD in her father; Coronary artery disease (age of onset: 71) in her brother; Dementia in her mother; Diabetes in her father; Heart failure in her brother and maternal grandmother; Stroke in her maternal grandmother. There is no history of Breast cancer.   Allergies Allergies  Allergen Reactions   Nuvigil [Armodafinil] Hives   Penicillins Diarrhea and Nausea And Vomiting    Did it involve swelling of the face/tongue/throat, SOB, or low BP? no Did it involve sudden or severe rash/hives, skin peeling, or any reaction on the inside of your mouth or nose? No Did you need to seek medical attention at a hospital or doctor's office? No When did it last happen?  in her 49s    If all above answers are "NO", may proceed with cephalosporin use.    Spironolactone Itching   Gabapentin Other (See Comments)    "wiped her out, couldn't stay awake"   Melatonin Other (See Comments)    "Brain fog"   Provigil [Modafinil] Hives   Entresto [Sacubitril-Valsartan] Itching   Lisinopril Cough     Home Medications  Prior to Admission medications   Medication Sig Start Date End Date Taking? Authorizing Provider  acetaminophen (TYLENOL) 500 MG tablet Take 1,000 mg by mouth every 6 (six) hours as needed.   Yes [provider]  benzonatate (TESSALON) 100 MG capsule TAKE 1 CAPSULE BY MOUTH THREE TIMES A DAY AS NEEDED FOR COUGH 03/08/22  Yes Tyler Pita, MD  calcitRIOL (ROCALTROL) 0.25 MCG capsule Take 0.25 mcg by mouth daily. 02/22/22  Yes [provider]  dapagliflozin propanediol (FARXIGA) 10 MG TABS tablet Take 1 tablet (10 mg total) by mouth daily before breakfast. 03/07/22  Yes Hackney, Tina A, FNP  diltiazem (CARDIZEM CD) 240 MG 24 hr capsule Take 1 capsule (240 mg total) by mouth daily. 09/05/21  Yes Theora Gianotti, NP  ipratropium-albuterol (DUONEB) 0.5-2.5 (3) MG/3ML SOLN TAKE 3 MLS BY NEBULIZATION EVERY 6 (SIX) HOURS AS NEEDED. J45.40 03/04/22  Yes Tyler Pita, MD  metolazone (ZAROXOLYN) 2.5 MG tablet Take 1 tablet (2.5 mg total) by mouth 2 (two) times a week. Patient taking differently: Take 2.5 mg by mouth 2 (two) times a week. Monday and Friday 02/04/22  Yes Wouk, Ailene Rud, MD  ondansetron [facility])  4 MG tablet Take 1 tablet (4 mg total) by mouth daily as needed for nausea or vomiting. 05/10/22 05/10/23 Yes Lorella Nimrod, MD  pantoprazole (PROTONIX) 40 MG tablet TAKE 1 TABLET BY MOUTH EVERY DAY 12/11/21  Yes Glean Hess, MD  PREVALITE 4 g packet DISSOLVE AND DRINK 1 PACKET BY MOUTH AT BEDTIME 03/05/22  Yes Glean Hess, MD  rOPINIRole (REQUIP) 1 MG tablet Take 1 tablet (1 mg total) by mouth 3 (three) times daily. 02/13/22  Yes Glean Hess, MD  Torsemide 40 MG TABS Take 40 mg by mouth daily.   Yes [provider]  budesonide (PULMICORT) 0.5 MG/2ML nebulizer solution TAKE 2 ML (0.5 MG TOTAL) BY NEBULIZATION TWICE A DAY Patient not taking: Reported on 06/08/2022 10/29/21   Tyler Pita, MD  feeding supplement (ENSURE ENLIVE / ENSURE PLUS) LIQD Take 237 mLs by mouth 3 (three) times daily between meals. 05/10/22   Lorella Nimrod, MD  metoCLOPramide (REGLAN) 5 MG tablet Take 1 tablet (5 mg total) by mouth 3 (three) times daily before  meals. Patient not taking: Reported on 06/08/2022 05/12/22   Sharen Hones, MD  NON FORMULARY Bipap - nightly with Oxygen 2 Lpm    [provider]  oxyCODONE 7.5 MG TABS Take 7.5 mg by mouth every 6 (six) hours as needed. 05/10/22 05/10/23  Lorella Nimrod, MD  OXYGEN Inhale into the lungs. 2 LPM     [provider]  promethazine (PHENERGAN) 25 MG tablet Take 1 tablet (25 mg total) by mouth every 6 (six) hours as needed. Patient not taking: Reported on 06/08/2022 05/10/22 05/10/23  Lorella Nimrod, MD     Critical care time: 40 minutes    Darel Hong, AGACNP-BC [place] [facility] Prefer epic messenger for cross cover needs If after hours, please call E-link

## 2022-06-20 NOTE — Evaluation (Addendum)
Clinical/Bedside Swallow Evaluation Patient Details  Name: Samantha Mason MRN: [id number] Date of Birth: [birth year]  Today's Date: 06/20/2022 Time: SLP Start Time (ACUTE ONLY): 0900 SLP Stop Time (ACUTE ONLY): 0930 SLP Time Calculation (min) (ACUTE ONLY): 30 min  Past Medical History:  Past Medical History:  Diagnosis Date   Acute bronchitis    Anemia    Arthritis    Asthma    B12 deficiency 03/14/2017   Chest pain 07/02/2018   Chest pain    a. 06/2018 MV: EF 66%. No ischemia/infarct; b. 02/2021 PET CT: EF>60%, no ischemia/infarct.   Chronic heart failure with preserved ejection fraction (HFpEF) ([place])    a. 08/2020 Echo: EF 55-60%, GrI DD; b. 06/2021 Echo: EF 55-60%, mild LVH, nl RV fxn, no signif valvular dzs.   COPD (chronic obstructive pulmonary disease) ([place]) 07/24/2018   Family history of blood clots 07/16/2017   Irritable bowel syndrome    Malignant hypertension    Obesity    OSA (obstructive sleep apnea)    uses cpap   Pneumonia 2017   Restless leg syndrome    Stroke ([place]) 1995   mild   Past Surgical History:  Past Surgical History:  Procedure Laterality Date   CHOLECYSTECTOMY     COLONOSCOPY WITH PROPOFOL N/A 03/31/2018   Procedure: COLONOSCOPY WITH PROPOFOL;  Surgeon: Lucilla Lame, MD;  Location: [facility] ENDOSCOPY;  Service: Endoscopy;  Laterality: N/A;   KNEE ARTHROPLASTY Right 04/30/2019   Procedure: COMPUTER ASSISTED TOTAL KNEE ARTHROPLASTY;  Surgeon: Dereck Leep, MD;  Location: [facility] ORS;  Service: Orthopedics;  Laterality: Right;   RIGHT HEART CATH N/A 01/30/2022   Procedure: RIGHT HEART CATH;  Surgeon: Nelva Bush, MD;  Location: [place] [facility];  Service: Cardiovascular;  Laterality: N/A;   saliva gland removal  1998   TOTAL KNEE ARTHROPLASTY Left 2014   TUBAL LIGATION Bilateral    HPI:  Per H&P, "pt is a 72 y.o. female with medical history significant for Morbid obesity, chronic HFpEF,CKD stage III, chronic lower extremity lymphedema on  treatment with torsemide, hypertension, asthma, stroke (1995) and obstructive sleep apnea(non compliant with cpap).  She presented to the emergency room accompanied by son with complaints of feeling unwell for the past 1 week.  She has been having chills but denied any fever.  She has also generally felt weak and fatigued.  She reports bilateral lower extremity swelling with associated redness.  Redness is more on the right eye compared to the left.  She had reported same to cardiology during the week and was advised to increase dose of Demadex to 60 mg.  She felt discomfort in her lower legs.  She also reports increasing weight gain over the same period.  She is having to use oxygen at all times during the day and also at night.  She admits to PND and orthopnea.  EMS was called to her home and was reported to have fever of 105F. In the ED, vitals were noted to be stable with some evidence of tachycardia. Initial temperature reported to be 105.  This is trended down to 98.6 at the time of my evaluation. Patient was initiated on sepsis protocol with IV fluids and antibiotics. Workup in the ED including CT abdomen and pelvis were done. Significant findings include an skin thickening and soft tissue edema of the proximal right thigh.  No obvious abscess when noted.  Large bilateral inguinal and iliac lymph nodes were noted.  Diffuse bronchial wall thickening was also noted." Noted  pt intubated on 06/11/22 and extubated 06/19/22.    Assessment / Plan / Recommendation  Clinical Impression   Pt appeared alert, generally oriented, and weak during eval. Noted pt recent extubated on 06/19/22 after 9 day intubation. Pt conversed w/ SLP. Pt left in bed w/ bed alarm set and call button in reach.  Noted aphonic vocal quality w/ a slight improvement during session which may be d/t po's. Difficulty w/ voicing may indicate reduced airway protection during swallows. Pt clarified to SLP that she does NOT want a NGT as well as NO  PEG tube/intubation per palliative care.  Pt on 40L O2 via HFNC; afebrile; WBC elevated.  Oral mech exam completed. Aphonia noted as above. Reddened, sore spots circa upper lip and corners of mouth as well as on L lingual surface. Noted mildly weak tongue protrusion and present, but seemingly reduced, hyolaryngeal elevation. Otherwise, WFL.  Pt presents with a highly suspected pharyngeal dysphagia c/b consistent, immediate throat clearing and intermittent wet vocal quality w/ trials of thin liquid. Pt at increased risk for aspiration/aspiration pneumonia likely d/t deconditioning, respiratory status,and recent/prolonged intubation.  Administered trials of ice chips (x30+) and thin liquids (x4 sips). During oral phase, pt exhibited functional bolus control/management, timely bolus A-P transit, appropriate anterior labial seal, and clear oral cavity post po's. During pharyngeal phase, noted seemingly reduced hyolaryngeal elevation, consistent, immediate throat clearing post following thin liquids, and mildly wet vocal quality immediately after thin liquid trials. OF NOTE: Consistent, immediate throat clearing and wet vocal quality were NOT noted in trials of ice chips.  Pt educated on aspiration precautions and POC. Pt appreciative and agreed. Pt expressed her goal to drink "sweet tea" in near future.  Recommend ice chips via TSP as desired to practice swallowing and for comfort. Staff to provide full assistance w/ feeding. Provide ice chips w/ aspiration precautions. SLP will f/u in next 1-2 days to monitor her progress and potential for diet upgrade. Nursing updated.   SLP Visit Diagnosis: Dysphagia, pharyngeal phase (R13.13)    Aspiration Risk  Moderate aspiration risk    Diet Recommendation   Recommend ice chips as desires to practice swallowing and for comfort. Staff to provide full assistance w/ feeding.  Medication Administration: Via alternative means    Other  Recommendations Oral Care  Recommendations: Oral care QID;Oral care prior to ice chip/H20    Recommendations for follow up therapy are one component of a multi-disciplinary discharge planning process, led by the attending physician.  Recommendations may be updated based on patient status, additional functional criteria and insurance authorization.  Follow up Recommendations Follow physician's recommendations for discharge plan and follow up therapies      Assistance Recommended at Discharge    Functional Status Assessment Patient has had a recent decline in their functional status and demonstrates the ability to make significant improvements in function in a reasonable and predictable amount of time.  Frequency and Duration min 2x/week  2 weeks       Prognosis Prognosis for Safe Diet Advancement: Fair Barriers to Reach Goals: Severity of deficits      Swallow Study   General Date of Onset: 06/08/22 HPI: Per H&P, "pt is a 72 y.o. female with medical history significant for Morbid obesity, chronic HFpEF,CKD stage III, chronic lower extremity lymphedema on treatment with torsemide, hypertension, asthma, stroke (1995) and obstructive sleep apnea(non compliant with cpap).  She presented to the emergency room accompanied by son with complaints of feeling unwell for the past 1 week.  She has been having chills but denied any fever.  She has also generally felt weak and fatigued.  She reports bilateral lower extremity swelling with associated redness.  Redness is more on the right eye compared to the left.  She had reported same to cardiology during the week and was advised to increase dose of Demadex to 60 mg.  She felt discomfort in her lower legs.  She also reports increasing weight gain over the same period.  She is having to use oxygen at all times during the day and also at night.  She admits to PND and orthopnea.  EMS was called to her home and was reported to have fever of 105F. In the ED, vitals were noted to be stable  with some evidence of tachycardia. Initial temperature reported to be 105.  This is trended down to 98.6 at the time of my evaluation. Patient was initiated on sepsis protocol with IV fluids and antibiotics. Workup in the ED including CT abdomen and pelvis were done. Significant findings include an skin thickening and soft tissue edema of the proximal right thigh.  No obvious abscess when noted.  Large bilateral inguinal and iliac lymph nodes were noted.  Diffuse bronchial wall thickening was also noted." Noted pt intubated on 06/11/22 and extubated 06/19/22. Type of Study: Bedside Swallow Evaluation Previous Swallow Assessment: N/A Diet Prior to this Study: NPO Temperature Spikes Noted: No (WBC 12.5) Respiratory Status: Nasal cannula (40 L) History of Recent Intubation: Yes Length of Intubations (days): 9 days Date extubated: 06/19/22 Behavior/Cognition: Alert;Cooperative;Pleasant mood;Lethargic/Drowsy Oral Cavity Assessment: Within Functional Limits;Dry Oral Care Completed by SLP: No Oral Cavity - Dentition: Adequate natural dentition Vision:  (N/A) Self-Feeding Abilities: Needs assist;Needs set up;Total assist Patient Positioning: Upright in bed Baseline Vocal Quality: Aphonic;Low vocal intensity Volitional Cough:  (Could not elicit. Pt able to clear throat.) Volitional Swallow: Able to elicit    Oral/Motor/Sensory Function Overall Oral Motor/Sensory Function: Generalized oral weakness Facial ROM: Within Functional Limits Facial Symmetry: Within Functional Limits Facial Strength: Within Functional Limits Lingual ROM: Within Functional Limits Lingual Symmetry: Within Functional Limits Lingual Strength: Reduced Lingual Sensation: Within Functional Limits Mandible: Within Functional Limits   Ice Chips Ice chips: Within functional limits Presentation: Spoon (x30+)   Thin Liquid Thin Liquid: Impaired Presentation: Cup;Spoon Pharyngeal  Phase Impairments: Wet Vocal Quality;Throat Clearing  - Immediate;Throat Clearing - Delayed;Decreased hyoid-laryngeal movement    Nectar Thick Nectar Thick Liquid: Not tested   Honey Thick Honey Thick Liquid: Not tested   Puree Puree: Not tested   Solid     Solid: Not tested      Randall Hiss Graduate Clinician [place], Speech Pathology   Randall Hiss 06/20/2022,10:17 AM

## 2022-06-20 NOTE — Evaluation (Signed)
Occupational Therapy Evaluation Patient Details Name: Samantha Mason MRN: [id number] DOB: 25-Oct-1950 Today's Date: 06/20/2022   History of Present Illness 72 y/o female with h/o morbid obesity, OSA, HTN, COPD, CHF, CKD IV, GERD, IBS, RLS and recent admission for SBO (medically managed) who is admitted with CHF, sepsis, UTI, AKI and cellulitis. Pt extubated on 1/31.   Clinical Impression   Patient presenting with decreased Ind in self care,balance, functional mobility/transfers, endurance, and safety awareness. Patient reports living at home with son,who works during the day, and being Ind with self care and functional mobility at baseline. Pt endorses son assists with  majority of IADLs. Pt currently on HFNC 40LS 50% FIO2 during session. O2 saturation remains at 90 % or better during session but RR increases to mid 40's with bed level activity. Pt fatigues very quickly with activity. Use of shampoo cap to wash hair and pt attempts to comb hair but becomes very tremulous and unable to finish without assistance. Heavy assist to roll in bed. Patient will benefit from acute OT to increase overall independence in the areas of ADLs, functional mobility, and safety awareness in order to safely discharge to [facility] at hospital discharge.      Recommendations for follow up therapy are one component of a multi-disciplinary discharge planning process, led by the attending physician.  Recommendations may be updated based on patient status, additional functional criteria and insurance authorization.   Follow Up Recommendations  OT at Long-term acute care hospital     Assistance Recommended at Discharge Intermittent Supervision/Assistance  Patient can return home with the following Two people to help with walking and/or transfers;Two people to help with bathing/dressing/bathroom;Assistance with cooking/housework;Help with stairs or ramp for entrance;Assist for transportation;Direct supervision/assist for  financial management;Direct supervision/assist for medications management    Functional Status Assessment  Patient has had a recent decline in their functional status and demonstrates the ability to make significant improvements in function in a reasonable and predictable amount of time.  Equipment Recommendations  Other (comment) (defer to next venue of care)       Precautions / Restrictions Precautions Precautions: Fall Restrictions Weight Bearing Restrictions: No      Mobility Bed Mobility Overal bed mobility: Needs Assistance Bed Mobility: Rolling Rolling: Max assist              Transfers                   General transfer comment: Pt declined attempts          ADL either performed or assessed with clinical judgement   ADL Overall ADL's : Needs assistance/impaired                                       General ADL Comments: Pt fatigues very quickly during session. Use of shower cap and pt's L UE very tremulous while attempting to comb hair but ultimately needing assist secondary to fatigue. Heavy max - total A for rolling in bed. RR increased to mid 40's with bed mobility and self care tasks.     Vision Patient Visual Report: No change from baseline              Pertinent Vitals/Pain Pain Assessment Pain Assessment: No/denies pain     Hand Dominance Left   Extremity/Trunk Assessment Upper Extremity Assessment Upper Extremity Assessment: Generalized weakness   Lower Extremity Assessment Lower Extremity  Assessment: Generalized weakness       Communication Communication Communication: Other (comment) (softly spoken from recent extubation)   Cognition Arousal/Alertness: Awake/alert Behavior During Therapy: WFL for tasks assessed/performed Overall Cognitive Status: Within Functional Limits for tasks assessed                                                  Home Living Family/patient expects to be  discharged to:: Private residence Living Arrangements: Children Available Help at Discharge: Family;Available PRN/intermittently (lives with son who works during the day) Type of Home: House Home Access: Stairs to enter Technical brewer of Steps: 3 Entrance Stairs-Rails: Right [place]: One level     Bathroom Shower/Tub: Tub/shower unit         Home Equipment: Advice worker (2 wheels);Cane - single point;BSC/3in1   Additional Comments: Equipment belongs to her mother who has passed away but she does not utilize at baseline      Prior Functioning/Environment Prior Level of Function : Independent/Modified Independent               ADLs Comments: Pt endorses being Ind in self care tasks with son performing majority of IADL tasks and driving her to appointments as needed. Pt does not use AD for mobility at baseline.        OT Problem List: Decreased strength;Cardiopulmonary status limiting activity;Decreased activity tolerance;Decreased safety awareness;Impaired balance (sitting and/or standing);Decreased knowledge of use of DME or AE      OT Treatment/Interventions: Self-care/ADL training;Therapeutic exercise;Therapeutic activities;Energy conservation;DME and/or AE instruction;Balance training;Patient/family education    OT Goals(Current goals can be found in the care plan section) Acute Rehab OT Goals Patient Stated Goal: to feel better OT Goal Formulation: With patient Time For Goal Achievement: 07/04/22 Potential to Achieve Goals: Fair ADL Goals Pt Will Perform Grooming: with min assist;sitting Pt Will Transfer to Toilet: with mod assist;stand pivot transfer Pt Will Perform Toileting - Clothing Manipulation and hygiene: with mod assist Pt/caregiver will Perform Home Exercise Program: Both right and left upper extremity;With theraband;Increased strength;With written HEP provided;With Supervision  OT Frequency: Min 2X/week       AM-PAC OT "6  Clicks" Daily Activity     Outcome Measure Help from another person eating meals?: A Little Help from another person taking care of personal grooming?: A Little Help from another person toileting, which includes using toliet, bedpan, or urinal?: Total Help from another person bathing (including washing, rinsing, drying)?: A Lot Help from another person to put on and taking off regular upper body clothing?: A Little Help from another person to put on and taking off regular lower body clothing?: A Lot 6 Click Score: 14   End of Session Equipment Utilized During Treatment: Oxygen (HFNC 40Ls) Nurse Communication: Mobility status  Activity Tolerance: Patient limited by fatigue Patient left: in bed;with call bell/phone within reach;with bed alarm set  OT Visit Diagnosis: Muscle weakness (generalized) (M62.81);Unsteadiness on feet (R26.81)                Time: 0960-4540 OT Time Calculation (min): 20 min Charges:  OT General Charges $OT Visit: 1 Visit OT Evaluation $OT Eval Moderate Complexity: 1 Mod OT Treatments $Self Care/Home Management : 8-22 mins  Darleen Crocker, MS, OTR/L , CBIS ascom [phone]  06/20/22, 11:34 AM

## 2022-06-20 NOTE — TOC Progression Note (Signed)
Transition of Care [facility]) - Progression Note    Patient Details  Name: Samantha Mason MRN: [id number] Date of Birth: June 15, 1950  [facility] CM/SW Contact  Shelbie Hutching, RN Phone Number: 06/20/2022, 11:20 AM  Clinical Narrative:    Patient extubated yesterday to HFNC 40L 50% FIO2.  Patient may be a good candidate for LTACH, RNCM will discuss with patient and family.    Expected Discharge Plan:  (TBD) Barriers to Discharge: Continued Medical Work up  Expected Discharge Plan and Services   Discharge Planning Services: CM Consult   Living arrangements for the past 2 months: Single Family Home                                       Social Determinants of Health (SDOH) Interventions SDOH Screenings   Food Insecurity: No Food Insecurity (06/09/2022)  Housing: Low Risk  (06/09/2022)  Transportation Needs: No Transportation Needs (06/09/2022)  Utilities: Not At Risk (06/09/2022)  Alcohol Screen: Low Risk  (08/08/2021)  Depression (PHQ2-9): Medium Risk (02/25/2022)  Financial Resource Strain: Medium Risk (08/14/2021)  Physical Activity: Inactive (08/08/2021)  Social Connections: Socially Isolated (08/08/2021)  Stress: No Stress Concern Present (08/08/2021)  Tobacco Use: Low Risk  (06/11/2022)    Readmission Risk Interventions     No data to display

## 2022-06-20 NOTE — Evaluation (Signed)
Physical Therapy Evaluation Patient Details Name: DANETT PALAZZO MRN: [id number] DOB: 1951/04/13 Today's Date: 06/20/2022  History of Present Illness  72 y/o female with h/o morbid obesity, OSA, HTN, COPD, CHF, CKD IV, GERD, IBS, RLS and recent admission for SBO (medically managed) who is admitted with CHF, sepsis, UTI, AKI and cellulitis. Pt extubated on 1/31.  Clinical Impression  Patient received in bed, HFNC, son at bedside. Patient requires max to total assist for bed mobility at this time. Patient has bilateral LE jerking and reports she has restless leg syndrome. Patient reports B LE pain with touch/movement. She will continue to benefit from skilled PT to improve functional independence, strength and safety.        Recommendations for follow up therapy are one component of a multi-disciplinary discharge planning process, led by the attending physician.  Recommendations may be updated based on patient status, additional functional criteria and insurance authorization.  Follow Up Recommendations Skilled nursing-short term rehab (<3 hours/day) Can patient physically be transported by private vehicle: No    Assistance Recommended at Discharge Frequent or constant Supervision/Assistance  Patient can return home with the following  Two people to help with walking and/or transfers;Two people to help with bathing/dressing/bathroom    Equipment Recommendations Other (comment) (TBD)  Recommendations for Other Services       Functional Status Assessment Patient has had a recent decline in their functional status and demonstrates the ability to make significant improvements in function in a reasonable and predictable amount of time.     Precautions / Restrictions Restrictions Weight Bearing Restrictions: No      Mobility  Bed Mobility Overal bed mobility: Needs Assistance Bed Mobility: Rolling Rolling: Max assist         General bed mobility comments: Cues and max assist to  roll right and left.    Transfers                   General transfer comment: unable to attempt    Ambulation/Gait               General Gait Details: unable  Stairs            Wheelchair Mobility    Modified Rankin (Stroke Patients Only)       Balance                                             Pertinent Vitals/Pain Pain Assessment Pain Assessment: Faces Faces Pain Scale: Hurts even more Breathing: normal Negative Vocalization: occasional moan/groan, low speech, negative/disapproving quality Facial Expression: facial grimacing Body Language: tense, distressed pacing, fidgeting Consolability: distracted or reassured by voice/touch PAINAD Score: 5 Pain Location: B Les Pain Descriptors / Indicators: Discomfort, Grimacing, Guarding Pain Intervention(s): Monitored during session, Repositioned    Home Living Family/patient expects to be discharged to:: Skilled nursing facility Living Arrangements: Children Available Help at Discharge: Family;Available PRN/intermittently Type of Home: House Home Access: Stairs to enter Entrance Stairs-Rails: Right Entrance Stairs-Number of Steps: 3   Home Layout: One level Home Equipment: Advice worker (2 wheels);Cane - single point;BSC/3in1 Additional Comments: Equipment belongs to her mother who has passed away but she does not utilize at baseline    Prior Function Prior Level of Function : Independent/Modified Independent             Mobility Comments: did not use  AD prior to admission ADLs Comments: Pt endorses being Ind in self care tasks with son performing majority of IADL tasks and driving her to appointments as needed. Pt does not use AD for mobility at baseline.     Hand Dominance        Extremity/Trunk Assessment   Upper Extremity Assessment Upper Extremity Assessment: Defer to OT evaluation    Lower Extremity Assessment Lower Extremity Assessment:  Generalized weakness;RLE deficits/detail;LLE deficits/detail RLE: Unable to fully assess due to pain RLE Coordination: decreased gross motor LLE: Unable to fully assess due to pain LLE Coordination: decreased gross motor       Communication   Communication: Expressive difficulties;Other (comment) (mouths answers or nods yes/no)  Cognition Arousal/Alertness: Awake/alert Behavior During Therapy: WFL for tasks assessed/performed Overall Cognitive Status: Within Functional Limits for tasks assessed                                          General Comments      Exercises Other Exercises Other Exercises: AP, heel slides, hip abd/add x 10 reps each bilaterally   Assessment/Plan    PT Assessment Patient needs continued PT services  PT Problem List Decreased strength;Decreased range of motion;Decreased activity tolerance;Decreased mobility;Obesity;Pain       PT Treatment Interventions DME instruction;Functional mobility training;Therapeutic activities;Patient/family education;Therapeutic exercise;Balance training    PT Goals (Current goals can be found in the Care Plan section)  Acute Rehab PT Goals Patient Stated Goal: none stated PT Goal Formulation: With patient/family Time For Goal Achievement: 07/04/22    Frequency Min 2X/week     Co-evaluation               AM-PAC PT "6 Clicks" Mobility  Outcome Measure Help needed turning from your back to your side while in a flat bed without using bedrails?: Total Help needed moving from lying on your back to sitting on the side of a flat bed without using bedrails?: Total Help needed moving to and from a bed to a chair (including a wheelchair)?: Total Help needed standing up from a chair using your arms (e.g., wheelchair or bedside chair)?: Total Help needed to walk in hospital room?: Total Help needed climbing 3-5 steps with a railing? : Total 6 Click Score: 6    End of Session Equipment Utilized During  Treatment: Oxygen Activity Tolerance: Patient limited by fatigue;Patient limited by pain Patient left: in bed;with family/visitor present Nurse Communication: Mobility status PT Visit Diagnosis: Muscle weakness (generalized) (M62.81);Other abnormalities of gait and mobility (R26.89);Pain Pain - Right/Left:  (bilateral) Pain - part of body: Leg    Time: 1459-1513 PT Time Calculation (min) (ACUTE ONLY): 14 min   Charges:   PT Evaluation $PT Eval Moderate Complexity: 1 Mod          Tuvia Woodrick, PT, GCS 06/20/22,3:41 PM

## 2022-06-20 NOTE — Plan of Care (Signed)
  Problem: Nutrition: Goal: Adequate nutrition will be maintained Outcome: Not Progressing   Problem: Activity: Goal: Risk for activity intolerance will decrease Outcome: Not Progressing   Problem: Elimination: Goal: Will not experience complications related to bowel motility Outcome: Progressing Goal: Will not experience complications related to urinary retention Outcome: Progressing   Problem: Nutrition: Goal: Adequate nutrition will be maintained Outcome: Not Progressing   Pt failed swallowing evaluation today by OT. Pt can only have ICE chips  Pt unable to tolerated activities well, SOB on exertion.  Complained of pain to bilateral  lower leg and prn fentanyl given x 3times.  Pt still have foley due accurate I/O total of 1750 for 12hrs 7am-7pm.  Pt continue on HFNC at 40% tolerating it, A/Ox2-3. Oriented to self, place disoriented to time  at times. Son at bedside since noon and was updated.

## 2022-06-20 NOTE — Progress Notes (Signed)
[facility]  ROUNDING NOTE   Subjective:   Samantha Mason  is a 72 year old female with past medical history including COPD with renal failure requiring 2 L oxygen as needed, chronic diastolic heart failure, sleep apnea, morbid obesity, hypertension, and chronic kidney disease stage IV. Patient presents to the emergency department feeling sick for a few days.  She was admitted for Sepsis [facility]) [A41.9] Cellulitis, unspecified cellulitis site [L03.90] Community acquired pneumonia, unspecified laterality [J18.9] Sepsis, due to unspecified organism, unspecified whether acute organ dysfunction present [facility]) [A41.9]  Patient is followed by our office outpatient. She was last seen in office on Apr 17, 2022.   Patient remains in ICU Extubated on HFNC Sodium continues to rise, 155 D5 at 20m/hr Good urine output   01/31 0701 - 02/01 0700 In: 616.7 [I.V.:17.7; NG/GT:199; IV Piggyback:400] Out: 42725[Urine:4050; Stool:300] Lab Results  Component Value Date   CREATININE 1.48 (H) 06/20/2022   CREATININE 1.54 (H) 06/19/2022   CREATININE 1.52 (H) 06/18/2022      Objective:  Vital signs in last 24 hours:  Temp:  [97.9 F (36.6 C)-99.2 F (37.3 C)] 99.2 F (37.3 C) (02/01 0800) Pulse Rate:  [76-104] 104 (02/01 0900) Resp:  [11-26] 26 (02/01 0900) BP: (100-166)/(43-81) 148/66 (02/01 0900) SpO2:  [92 %-100 %] 92 % (02/01 0900) FiO2 (%):  [40 %-60 %] 50 % (02/01 0900) Weight:  [144.2 kg] 144.2 kg (02/01 0500)  Weight change: 3.9 kg Filed Weights   06/18/22 0500 06/19/22 0353 06/20/22 0500  Weight: (!) 147.2 kg (!) 140.3 kg (!) 144.2 kg    Intake/Output: I/O last 3 completed shifts: In: 1493.6 [I.V.:144.6; NG/GT:949; IV Piggyback:400] Out: 6100 [Urine:5700; Stool:400]   Intake/Output this shift:  Total I/O In: 144.8 [I.V.:144.8] Out: -   Physical Exam: General: Critically ill-appearing  Head: Normocephalic, atraumatic.    Eyes: Anicteric  Lungs:  coarse  breath sounds bilateral, HFNC  Heart: Regular rate and rhythm  Abdomen:  Soft, nontender  Extremities: 2+ peripheral edema.  Neurologic: Alert and oriented  Skin: No lesions  Access: No hemodialysis access    Basic Metabolic Panel: Recent Labs  Lab 06/15/22 0404 06/16/22 0400 06/17/22 0406 06/18/22 0423 06/19/22 0343 06/20/22 0444  NA 140 142 145 146* 149* 155*  K 4.1 4.2 4.5 3.6 3.9 3.5  CL 101 101 100 101 104 106  CO2 29 32 32 35* 37* 39*  GLUCOSE 194* 192* 179* 130* 138* 129*  BUN 77* 88* 101* 116* 123* 109*  CREATININE 1.87* 1.62* 1.60* 1.52* 1.54* 1.48*  CALCIUM 8.5* 8.8* 8.8* 8.9 9.2 9.3  MG 1.9  --  1.5* 2.2 2.0 2.2  PHOS 1.9* 2.6 2.8 2.6 3.3 4.4     Liver Function Tests: Recent Labs  Lab 06/16/22 0400 06/17/22 0406 06/18/22 0423 06/19/22 0343 06/20/22 0444  ALBUMIN 2.5* 2.5* 2.6* 2.5* 2.7*    No results for input(s): "LIPASE", "AMYLASE" in the last 168 hours. No results for input(s): "AMMONIA" in the last 168 hours.   CBC: Recent Labs  Lab 06/14/22 0404 06/15/22 0404 06/16/22 0400 06/17/22 0406 06/18/22 0423 06/19/22 0343 06/20/22 0444  WBC 15.8*  16.1*   < > 25.1* 11.7* 10.9* 11.3* 12.5*  NEUTROABS 13.4*  --   --   --   --   --   --   HGB 8.9*  9.0*   < > 9.0* 8.4* 8.6* 8.4* 8.4*  HCT 30.7*  30.3*   < > 30.8* 28.1* 29.0* 29.0* 30.1*  MCV 81.9  81.9   < > 81.5 80.3 81.7 83.1 86.5  PLT 310  319   < > 271 194 202 191 191   < > = values in this interval not displayed.     Cardiac Enzymes: No results for input(s): "CKTOTAL", "CKMB", "CKMBINDEX", "TROPONINI" in the last 168 hours.  BNP: Invalid input(s): "POCBNP"  CBG: Recent Labs  Lab 06/19/22 1510 06/19/22 1942 06/19/22 2351 06/20/22 0410 06/20/22 0740  GLUCAP 110* 115* 93 119* 76*     Microbiology: Results for orders placed or performed during the hospital encounter of 06/08/22  Blood Culture (routine x 2)     Status: None   Collection Time: 06/08/22  7:18 PM    Specimen: BLOOD  Result Value Ref Range Status   Specimen Description BLOOD BLOOD LEFT ARM  Final   Special Requests   Final    BOTTLES DRAWN AEROBIC AND ANAEROBIC Blood Culture adequate volume   Culture   Final    NO GROWTH 5 DAYS Performed at [facility], [address], [place], [place] [address]    Report Status 06/13/2022 FINAL  Final  Blood Culture (routine x 2)     Status: None   Collection Time: 06/08/22  7:18 PM   Specimen: BLOOD  Result Value Ref Range Status   Specimen Description BLOOD BLOOD RIGHT ARM  Final   Special Requests   Final    BOTTLES DRAWN AEROBIC AND ANAEROBIC Blood Culture adequate volume   Culture   Final    NO GROWTH 5 DAYS Performed at [facility], [address] [place], [place] [address]    Report Status 06/13/2022 FINAL  Final  Resp panel by RT-PCR (RSV, Flu A&B, Covid) Anterior Nasal Swab     Status: None   Collection Time: 06/08/22  7:18 PM   Specimen: Anterior Nasal Swab  Result Value Ref Range Status   SARS Coronavirus 2 by RT PCR NEGATIVE NEGATIVE Final    Comment: (NOTE) SARS-CoV-2 target nucleic acids are NOT DETECTED.  The SARS-CoV-2 RNA is generally detectable in upper respiratory specimens during the acute phase of infection. The lowest concentration of SARS-CoV-2 viral copies this assay can detect is 138 copies/mL. A negative result does not preclude SARS-Cov-2 infection and should not be used as the sole basis for treatment or other patient management decisions. A negative result may occur with  improper specimen collection/handling, submission of specimen other than nasopharyngeal swab, presence of viral mutation(s) within the areas targeted by this assay, and inadequate number of viral copies(<138 copies/mL). A negative result must be combined with clinical observations, patient history, and epidemiological information. The expected result is Negative.  Fact Sheet for Patients:   EntrepreneurPulse.com.au  Fact Sheet for Healthcare Providers:  IncredibleEmployment.be  This test is no t yet approved or cleared by the [place] FDA and  has been authorized for detection and/or diagnosis of SARS-CoV-2 by FDA under an Emergency Use Authorization (EUA). This EUA will remain  in effect (meaning this test can be used) for the duration of the COVID-19 declaration under Section 564(b)(1) of the Act, 21 U.S.C.section 360bbb-3(b)(1), unless the authorization is terminated  or revoked sooner.       Influenza A by PCR NEGATIVE NEGATIVE Final   Influenza B by PCR NEGATIVE NEGATIVE Final    Comment: (NOTE) The Xpert Xpress SARS-CoV-2/FLU/RSV plus assay is intended as an aid in the diagnosis of influenza from Nasopharyngeal swab specimens and should not be used as  a sole basis for treatment. Nasal washings and aspirates are unacceptable for Xpert Xpress SARS-CoV-2/FLU/RSV testing.  Fact Sheet for Patients: EntrepreneurPulse.com.au  Fact Sheet for Healthcare Providers: IncredibleEmployment.be  This test is not yet approved or cleared by the [place] FDA and has been authorized for detection and/or diagnosis of SARS-CoV-2 by FDA under an Emergency Use Authorization (EUA). This EUA will remain in effect (meaning this test can be used) for the duration of the COVID-19 declaration under Section 564(b)(1) of the Act, 21 U.S.C. section 360bbb-3(b)(1), unless the authorization is terminated or revoked.     Resp Syncytial Virus by PCR NEGATIVE NEGATIVE Final    Comment: (NOTE) Fact Sheet for Patients: EntrepreneurPulse.com.au  Fact Sheet for Healthcare Providers: IncredibleEmployment.be  This test is not yet approved or cleared by the [place] FDA and has been authorized for detection and/or diagnosis of SARS-CoV-2 by FDA under an Emergency Use  Authorization (EUA). This EUA will remain in effect (meaning this test can be used) for the duration of the COVID-19 declaration under Section 564(b)(1) of the Act, 21 U.S.C. section 360bbb-3(b)(1), unless the authorization is terminated or revoked.  Performed at [facility], [address], [place], [place] [address]   Urine Culture     Status: None   Collection Time: 06/08/22  9:34 PM   Specimen: In/Out Cath Urine  Result Value Ref Range Status   Specimen Description   Final    IN/OUT CATH URINE Performed at [facility], [address], [place], [place] [address]    Special Requests   Final    NONE Performed at [facility], [address] [place], [place] [address]    Culture   Final    NO GROWTH Performed at [facility], [place] [address], [place], [place] [address]    Report Status 06/10/2022 FINAL  Final  MRSA Next Gen by PCR, Nasal     Status: None   Collection Time: 06/10/22 11:49 PM   Specimen: Nasal Mucosa; Nasal Swab  Result Value Ref Range Status   MRSA by PCR Next Gen NOT DETECTED NOT DETECTED Final    Comment: (NOTE) The GeneXpert MRSA Assay (FDA approved for NASAL specimens only), is one component of a comprehensive MRSA colonization surveillance program. It is not intended to diagnose MRSA infection nor to guide or monitor treatment for MRSA infections. Test performance is not FDA approved in patients less than 11 years old. Performed at [facility], [place], [place] [address]   Respiratory (~20 pathogens) panel by PCR     Status: None   Collection Time: 06/12/22  6:15 AM   Specimen: Nasopharyngeal Swab; Respiratory  Result Value Ref Range Status   Adenovirus NOT DETECTED NOT DETECTED Final   Coronavirus 229E NOT DETECTED NOT DETECTED Final    Comment: (NOTE) The Coronavirus on the Respiratory Panel, DOES NOT test for the novel  Coronavirus (2019 nCoV)    Coronavirus HKU1 NOT DETECTED NOT  DETECTED Final   Coronavirus NL63 NOT DETECTED NOT DETECTED Final   Coronavirus OC43 NOT DETECTED NOT DETECTED Final   Metapneumovirus NOT DETECTED NOT DETECTED Final   Rhinovirus / Enterovirus NOT DETECTED NOT DETECTED Final   Influenza A NOT DETECTED NOT DETECTED Final   Influenza B NOT DETECTED NOT DETECTED Final   Parainfluenza Virus 1 NOT DETECTED NOT DETECTED Final   Parainfluenza Virus 2 NOT DETECTED NOT DETECTED Final   Parainfluenza Virus 3 NOT DETECTED NOT DETECTED Final  Parainfluenza Virus 4 NOT DETECTED NOT DETECTED Final   Respiratory Syncytial Virus NOT DETECTED NOT DETECTED Final   Bordetella pertussis NOT DETECTED NOT DETECTED Final   Bordetella Parapertussis NOT DETECTED NOT DETECTED Final   Chlamydophila pneumoniae NOT DETECTED NOT DETECTED Final   Mycoplasma pneumoniae NOT DETECTED NOT DETECTED Final    Comment: Performed at [facility], [place] [address], [place], [place] [address]  Culture, Respiratory w Gram Stain     Status: None   Collection Time: 06/17/22  8:15 AM   Specimen: Tracheal Aspirate; Respiratory  Result Value Ref Range Status   Specimen Description   Final    TRACHEAL ASPIRATE Performed at [facility], [address] [place], [place] [address]    Special Requests   Final    NONE Performed at [facility], [address] [place], [place] [address]    Gram Stain   Final    RARE WBC PRESENT, PREDOMINANTLY PMN RARE SQUAMOUS EPITHELIAL CELLS PRESENT FEW YEAST Performed at [facility], [place] [address] [place], [place] [address]    Culture FEW CANDIDA ALBICANS  Final   Report Status 06/19/2022 FINAL  Final    Coagulation Studies: No results for input(s): "LABPROT", "INR" in the last 72 hours.  Urinalysis: No results for input(s): "COLORURINE", "LABSPEC", "PHURINE", "GLUCOSEU", "HGBUR", "BILIRUBINUR", "KETONESUR", "PROTEINUR", "UROBILINOGEN", "NITRITE", "LEUKOCYTESUR" in the last 72 hours.  Invalid input(s):  "APPERANCEUR"    Imaging: DG Chest Port 1 View  Result Date: 06/20/2022 CLINICAL DATA:  Acute on chronic respiratory failure with hypoxia and hypercapnia (HCC) EXAM: PORTABLE CHEST 1 VIEW COMPARISON:  Chest x-ray 06/17/2022. FINDINGS: Bibasilar opacities. Possible small right pleural effusion. No visible pneumothorax. Similar position of a left IJ approach central venous catheter. Polyarticular degenerative change. Unchanged cardiomediastinal silhouette, accentuated by technique. IMPRESSION: 1. Bibasilar opacities, which could represent atelectasis, aspiration, and/or pneumonia. 2. Possible small right pleural effusion. Electronically Signed   By: Margaretha Sheffield M.D.   On: 06/20/2022 08:16     Medications:    sodium chloride Stopped (06/17/22 2305)   ceFEPime (MAXIPIME) IV Stopped (06/20/22 0322)   dextrose 50 mL/hr at 06/20/22 0918    budesonide (PULMICORT) nebulizer solution  0.5 mg Nebulization BID   Chlorhexidine Gluconate Cloth  6 each Topical Daily   fluocinonide-emollient   Topical QODAY   heparin  5,000 Units Subcutaneous Q8H   insulin aspart  0-20 Units Subcutaneous Q4H   ipratropium-albuterol  3 mL Nebulization Q6H   mouth rinse  15 mL Mouth Rinse Q2H   pantoprazole (PROTONIX) IV  40 mg Intravenous Q24H   docusate sodium, fentaNYL (SUBLIMAZE) injection, ipratropium-albuterol, ondansetron **OR** ondansetron (ZOFRAN) IV, mouth rinse, phenol  Assessment/ Plan:  Samantha Mason is a 72 y.o.  female  with past medical history including COPD with renal failure requiring 2 L oxygen as needed, chronic diastolic heart failure, sleep apnea, morbid obesity, hypertension, and chronic kidney disease stage IV. Patient presents to the emergency department feeling sick and was admitted for Sepsis [facility]) [A41.9] Cellulitis, unspecified cellulitis site [L03.90] Community acquired pneumonia, unspecified laterality [J18.9] Sepsis, due to unspecified organism, unspecified whether acute organ  dysfunction present [facility]) [A41.9]   Acute Kidney Injury on chronic kidney disease stage IIIb with baseline creatinine 1.55 and GFR of 36 on 04/17/22.  Acute kidney injury secondary to severe infection. No IV contrast exposure.  Diuretics held. Lower extremity edema present, however patient has history of lymphedema. Chest xray questionable to pulmonary edema.  Renal function stable, adequate urine output noted. Continue to hold diuretics. BUN remains elevated but improving. Agree with IVF for now.    Lab Results  Component Value Date   CREATININE 1.48 (H) 06/20/2022   CREATININE 1.54 (H) 06/19/2022   CREATININE 1.52 (H) 06/18/2022    Intake/Output Summary (Last 24 hours) at 06/20/2022 1034 Last data filed at 06/20/2022 0918 Gross per 24 hour  Intake 761.51 ml  Output 3650 ml  Net -2888.49 ml    2. Anemia of chronic kidney disease Lab Results  Component Value Date   HGB 8.4 (L) 06/20/2022    Hgb below acceptable target. Will monitor for now and consider need for ESA's.  3.  Chronic diastolic heart failure.  Echo from 07/16/21 shows EF 55 to 60% with mild LVH.  Diuretics held  4.  Acute respiratory failure.  Extubated and placed on HFNC.    LOS: [place] 2/1/202410:34 AM

## 2022-06-21 ENCOUNTER — Inpatient Hospital Stay: Payer: Medicare Other

## 2022-06-21 DIAGNOSIS — Z66 Do not resuscitate: Secondary | ICD-10-CM

## 2022-06-21 DIAGNOSIS — Z515 Encounter for palliative care: Secondary | ICD-10-CM | POA: Diagnosis not present

## 2022-06-21 DIAGNOSIS — Z7189 Other specified counseling: Secondary | ICD-10-CM | POA: Diagnosis not present

## 2022-06-21 DIAGNOSIS — G4733 Obstructive sleep apnea (adult) (pediatric): Secondary | ICD-10-CM

## 2022-06-21 DIAGNOSIS — L0390 Cellulitis, unspecified: Secondary | ICD-10-CM | POA: Diagnosis not present

## 2022-06-21 DIAGNOSIS — A419 Sepsis, unspecified organism: Secondary | ICD-10-CM | POA: Diagnosis not present

## 2022-06-21 DIAGNOSIS — G9341 Metabolic encephalopathy: Secondary | ICD-10-CM | POA: Diagnosis not present

## 2022-06-21 DIAGNOSIS — J189 Pneumonia, unspecified organism: Secondary | ICD-10-CM | POA: Diagnosis not present

## 2022-06-21 DIAGNOSIS — J9621 Acute and chronic respiratory failure with hypoxia: Secondary | ICD-10-CM | POA: Diagnosis not present

## 2022-06-21 LAB — GLUCOSE, CAPILLARY
Glucose-Capillary: 109 mg/dL — ABNORMAL HIGH (ref 70–99)
Glucose-Capillary: 110 mg/dL — ABNORMAL HIGH (ref 70–99)
Glucose-Capillary: 110 mg/dL — ABNORMAL HIGH (ref 70–99)
Glucose-Capillary: 111 mg/dL — ABNORMAL HIGH (ref 70–99)
Glucose-Capillary: 113 mg/dL — ABNORMAL HIGH (ref 70–99)
Glucose-Capillary: 118 mg/dL — ABNORMAL HIGH (ref 70–99)

## 2022-06-21 LAB — CBC
HCT: 30.6 % — ABNORMAL LOW (ref 36.0–46.0)
Hemoglobin: 8.6 g/dL — ABNORMAL LOW (ref 12.0–15.0)
MCH: 24.4 pg — ABNORMAL LOW (ref 26.0–34.0)
MCHC: 28.1 g/dL — ABNORMAL LOW (ref 30.0–36.0)
MCV: 86.7 fL (ref 80.0–100.0)
Platelets: 219 10*3/uL (ref 150–400)
RBC: 3.53 MIL/uL — ABNORMAL LOW (ref 3.87–5.11)
RDW: 20 % — ABNORMAL HIGH (ref 11.5–15.5)
WBC: 12.2 10*3/uL — ABNORMAL HIGH (ref 4.0–10.5)
nRBC: 0 % (ref 0.0–0.2)

## 2022-06-21 LAB — RENAL FUNCTION PANEL
Albumin: 2.6 g/dL — ABNORMAL LOW (ref 3.5–5.0)
Anion gap: 9 (ref 5–15)
BUN: 87 mg/dL — ABNORMAL HIGH (ref 8–23)
CO2: 37 mmol/L — ABNORMAL HIGH (ref 22–32)
Calcium: 9.1 mg/dL (ref 8.9–10.3)
Chloride: 112 mmol/L — ABNORMAL HIGH (ref 98–111)
Creatinine, Ser: 1.83 mg/dL — ABNORMAL HIGH (ref 0.44–1.00)
GFR, Estimated: 29 mL/min — ABNORMAL LOW (ref >60)
Glucose, Bld: 111 mg/dL — ABNORMAL HIGH (ref 70–99)
Phosphorus: 3.9 mg/dL (ref 2.5–4.6)
Potassium: 4 mmol/L (ref 3.5–5.1)
Sodium: 158 mmol/L — ABNORMAL HIGH (ref 135–145)

## 2022-06-21 LAB — BLOOD GAS, VENOUS
Acid-Base Excess: 12.1 mmol/L — ABNORMAL HIGH (ref 0.0–2.0)
Bicarbonate: 36.7 mmol/L — ABNORMAL HIGH (ref 20.0–28.0)
O2 Saturation: 82.1 %
Patient temperature: 37
pCO2, Ven: 46 mmHg (ref 44–60)
pH, Ven: 7.51 — ABNORMAL HIGH (ref 7.25–7.43)
pO2, Ven: 46 mmHg — ABNORMAL HIGH (ref 32–45)

## 2022-06-21 LAB — D-DIMER, QUANTITATIVE: D-Dimer, Quant: 1.38 ug/mL-FEU — ABNORMAL HIGH (ref 0.00–0.50)

## 2022-06-21 LAB — MAGNESIUM: Magnesium: 2.3 mg/dL (ref 1.7–2.4)

## 2022-06-21 LAB — PROCALCITONIN: Procalcitonin: 0.58 ng/mL

## 2022-06-21 MED ORDER — DEXTROSE 5 % IV SOLN: INTRAVENOUS | Status: DC

## 2022-06-21 MED ORDER — ACETAMINOPHEN 10 MG/ML IV SOLN
1000.0000 mg | Freq: Four times a day (QID) | INTRAVENOUS | Status: AC | PRN
  Administered 2022-06-21: 1000 mg via INTRAVENOUS
  Filled 2022-06-21: qty 100

## 2022-06-21 MED ORDER — FENTANYL CITRATE PF 50 MCG/ML IJ SOSY
12.5000 ug | PREFILLED_SYRINGE | Freq: Once | INTRAMUSCULAR | Status: AC
  Administered 2022-06-21: 12.5 ug via INTRAVENOUS
  Filled 2022-06-21: qty 1

## 2022-06-21 MED ORDER — FUROSEMIDE 10 MG/ML IJ SOLN: 40.0000 mg | Freq: Once | INTRAMUSCULAR | Status: DC

## 2022-06-21 MED ORDER — FENTANYL CITRATE PF 50 MCG/ML IJ SOSY
12.5000 ug | PREFILLED_SYRINGE | INTRAMUSCULAR | Status: DC | PRN
  Administered 2022-06-21 – 2022-06-22 (×4): 12.5 ug via INTRAVENOUS
  Filled 2022-06-21 (×4): qty 1

## 2022-06-21 NOTE — Consult Note (Signed)
PHARMACY CONSULT NOTE  Pharmacy Consult for Electrolyte Monitoring and Replacement   Recent Labs: Potassium (mmol/L)  Date Value  06/21/2022 4.0  05/25/2014 3.6   Magnesium (mg/dL)  Date Value  06/21/2022 2.3   Calcium (mg/dL)  Date Value  06/21/2022 9.1   Calcium, Total (mg/dL)  Date Value  05/25/2014 8.6   Albumin (g/dL)  Date Value  06/21/2022 2.6 (L)  05/25/2014 3.2 (L)   Phosphorus (mg/dL)  Date Value  06/21/2022 3.9   Sodium (mmol/L)  Date Value  06/21/2022 158 (H)  02/13/2022 135  05/25/2014 141    Assessment: 72 y.o. female with PMHx significant for HFpEF, COPD, OSA (noncompliant with CPAP), CKD Stage III who is  admitted with Sepsis in setting of lower extremity Cellulitis, AKI, and  Acute on Chronic Hypercapnic Respiratory Failure in setting AECOPD. Pharmacy has been consulted to monitor and replace electrolytes while PCCM care.  Diuretics: furosemide infusion at 4 mg/hr   Nutrition: Vital at 60 mL/hr + FWF 30 mL q4h  Goal of Therapy:  Electrolytes WNL  Plan:  --No replacement warranted --Na 155 > 158. D5W @ 50 mL/hr as ordered by medical team. Continue to monitor.  --Will recheck electrolytes with AM labs   Wynelle Cleveland, PharmD, BCPS Clinical Pharmacist 06/21/2022 8:02 AM

## 2022-06-21 NOTE — Progress Notes (Signed)
Palliative Care Progress Note, Assessment & Plan   Patient Name: Samantha Mason       Date: 06/21/2022 DOB: 02/24/1951  Age: 72 y.o. MRN#: [id number] Attending Physician: Flora Lipps, MD Primary Care Physician: Glean Hess, MD Admit Date: 06/08/2022  Subjective: Patient is lying in bed in no apparent distress with HHFNC in place.   No family present at bedside.  HPI: 72 y.o. female  with past medical history of morbid obesity, chronic HFpEF, CKD (stage III), chronic LE lymphedema (treatment of torsemide), HTN, asthma, stroke (1995), and OSA (noncompliant with CPAP) admitted on 06/08/2022 with feeling unwell for approximately 1 week, chills, generalized weakness, and fatigue.   Patient is being treated for sepsis in setting of lower extremity cellulitis, AKI, and acute on chronic hypercapnic respiratory failure in the setting of AECOPD.   1/23, patient was transferred to ICU due to CO2 narcosis and placed on BiPAP.  Patient is having worsening AKI as well as agitation requiring Precedex.  Patient is at high risk for intubation.  PMT was consulted to discuss goals of care. 1/25 Intubated 1/31 extubated and DNR/DNI confirmed 2/1 failed SLP and declining NG tube  Summary of counseling/coordination of care: After reviewing the patient's chart and assessing the patient at bedside, I counseled with NP Meda Coffee and patient's RN Noberto Retort.  We reviewed that patient has been expressing to medical team this AM that she does not want NG tube, does not want rehab, and wants comfort care. No family at bedside at this point.  I met with patient as well. She is very soft spoken but she repeats to me she does not want NG tube. I ask her if she understands that without NG tube she cannot get nutrition and she nods. I  ask her if she understands what this means and she nods. We discuss that without intervention she is nearing end of life and she nods to this. When I ask her if she would like the medical team to focus on her comfort only her answer is vague and I cannot determine what she means. I share with her that further discussions are needed with her family and she only repetitively states "I do not want them involved, they know". I share with her the need to share with her family her expressed desires and concern about her care moving forward and she eventually nods that I can call them.  Upon calling [name] he tells me he is currently on his way to the hospital and we planned to meet once he arrives.   Once [name] arrived we met at the bedside with Ms. Grandville Silos. We also had patient's daughter Izora Gala on video chat. We reviewed the above conversation. Again with family watching I ask Ms. Harding about a temporary feeding tube - reiterating this would not be a PEG/not permanent. She consistently says no. I ask her if she understands she cannot receive nutrition and this may add to her decline and nearing end of life - she nods that she understands. I ask her if she wants the medical team to focus on her comfort and she does not respond. I ask her if she heard me and she nods yes.  Her family asks her if she is tired and she nods yes. They ask if she wants to give up and she does not respond. We asked in various different ways how the medical team should approach her care and could not come to a conclusion for how to move forward. We eventually asked Ms Rone if she needs time and she nods yes.  We discussed that the plan for now would be to not pursue feeding tube and continue to use bipap as needed to allow for more time for patient and family to come to decision. Patient and family agree to this. We discuss that we need a decision sooner rather than later, within the next day. Family and patient express  understanding.   Shared discussed with Donell Beers, NP and RN Noberto Retort.    Physical Exam Vitals reviewed.  Constitutional:      General: She is not in acute distress.    Appearance: She is obese. She is ill-appearing.  HENT:     Head: Normocephalic.  Cardiovascular:     Rate and Rhythm: Normal rate.     Pulses: Normal pulses.  Pulmonary:     Comments: Remains in [place] Skin:    Comments: Celulities of groin and bilateral LEs            Plan: Patient refusing NG at this point but agreeable to bipap as needed. She cannot decide if she is ready for comfort measures only, requests more time. Patient and family understand medical team needs direction soon - within the next day. PMT not covering over the weekend, discussions to continue with critical care team. PMT will follow up Monday.   Juel Burrow, DNP, AGNP-C Palliative Medicine Team Team Phone # [phone]  Pager # [phone]

## 2022-06-21 NOTE — Progress Notes (Signed)
[facility]  ROUNDING NOTE   Subjective:   Samantha Mason  is a 72 year old female with past medical history including COPD with renal failure requiring 2 L oxygen as needed, chronic diastolic heart failure, sleep apnea, morbid obesity, hypertension, and chronic kidney disease stage IV. Patient presents to the emergency department feeling sick for a few days.  She was admitted for Sepsis [facility]) [A41.9] Cellulitis, unspecified cellulitis site [L03.90] Community acquired pneumonia, unspecified laterality [J18.9] Sepsis, due to unspecified organism, unspecified whether acute organ dysfunction present [facility]) [A41.9]  Patient is followed by our office outpatient. She was last seen in office on Apr 17, 2022.   Patient remains in ICU Remains on HFNC, denies shortness of breath Complains of lower extremity discomfort Tube feeds held IVF remains at 42m/hr  02/01 0701 - 02/02 0700 In: 1514.3 [I.V.:1154.3; IV Piggyback:300] Out: 2604 [Urine:2404; Stool:200] Lab Results  Component Value Date   CREATININE 1.83 (H) 06/21/2022   CREATININE 1.48 (H) 06/20/2022   CREATININE 1.54 (H) 06/19/2022      Objective:  Vital signs in last 24 hours:  Temp:  [98 F (36.7 C)-101.5 F (38.6 C)] 100.9 F (38.3 C) (02/02 0800) Pulse Rate:  [94-109] 96 (02/02 1000) Resp:  [21-27] 21 (02/02 1000) BP: (127-157)/(48-67) 141/57 (02/02 1000) SpO2:  [90 %-100 %] 90 % (02/02 1000) FiO2 (%):  [40 %-50 %] 50 % (02/02 0800) Weight:  [140 kg] 140 kg (02/02 0500)  Weight change: -4.2 kg Filed Weights   06/19/22 0353 06/20/22 0500 06/21/22 0500  Weight: (!) 140.3 kg (!) 144.2 kg (!) 140 kg    Intake/Output: I/O last 3 completed shifts: In: 1614.7 [I.V.:1154.7; Other:60; IV Piggyback:400] Out: 42951 [OACZY:6063 SKZSWF:093]  Intake/Output this shift:  Total I/O In: 149.9 [I.V.:149.9] Out: -   Physical Exam: General: Critically ill-appearing  Head: Normocephalic, atraumatic.    Eyes:  Anicteric  Lungs:  coarse breath sounds bilateral, HFNC  Heart: Regular rate and rhythm  Abdomen:  Soft, nontender  Extremities: 2+ peripheral edema.  Neurologic: Alert and oriented  Skin: No lesions  Access: No hemodialysis access    Basic Metabolic Panel: Recent Labs  Lab 06/17/22 0406 06/18/22 0423 06/19/22 0343 06/20/22 0444 06/21/22 0500 06/21/22 0530  NA 145 146* 149* 155*  --  158*  K 4.5 3.6 3.9 3.5  --  4.0  CL 100 101 104 106  --  112*  CO2 32 35* 37* 39*  --  37*  GLUCOSE 179* 130* 138* 129*  --  111*  BUN 101* 116* 123* 109*  --  87*  CREATININE 1.60* 1.52* 1.54* 1.48*  --  1.83*  CALCIUM 8.8* 8.9 9.2 9.3  --  9.1  MG 1.5* 2.2 2.0 2.2 2.3  --   PHOS 2.8 2.6 3.3 4.4  --  3.9     Liver Function Tests: Recent Labs  Lab 06/17/22 0406 06/18/22 0423 06/19/22 0343 06/20/22 0444 06/21/22 0530  ALBUMIN 2.5* 2.6* 2.5* 2.7* 2.6*    No results for input(s): "LIPASE", "AMYLASE" in the last 168 hours. No results for input(s): "AMMONIA" in the last 168 hours.   CBC: Recent Labs  Lab 06/17/22 0406 06/18/22 0423 06/19/22 0343 06/20/22 0444 06/21/22 0500  WBC 11.7* 10.9* 11.3* 12.5* 12.2*  HGB 8.4* 8.6* 8.4* 8.4* 8.6*  HCT 28.1* 29.0* 29.0* 30.1* 30.6*  MCV 80.3 81.7 83.1 86.5 86.7  PLT 194 202 191 191 219     Cardiac Enzymes: No results for input(s): "CKTOTAL", "CKMB", "  CKMBINDEX", "TROPONINI" in the last 168 hours.  BNP: Invalid input(s): "POCBNP"  CBG: Recent Labs  Lab 06/20/22 1552 06/20/22 1925 06/20/22 2338 06/21/22 0330 06/21/22 0739  GLUCAP 112* 120* 109* 110* 111*     Microbiology: Results for orders placed or performed during the hospital encounter of 06/08/22  Blood Culture (routine x 2)     Status: None   Collection Time: 06/08/22  7:18 PM   Specimen: BLOOD  Result Value Ref Range Status   Specimen Description BLOOD BLOOD LEFT ARM  Final   Special Requests   Final    BOTTLES DRAWN AEROBIC AND ANAEROBIC Blood Culture  adequate volume   Culture   Final    NO GROWTH 5 DAYS Performed at [facility], [address], [place], [place] [address]    Report Status 06/13/2022 FINAL  Final  Blood Culture (routine x 2)     Status: None   Collection Time: 06/08/22  7:18 PM   Specimen: BLOOD  Result Value Ref Range Status   Specimen Description BLOOD BLOOD RIGHT ARM  Final   Special Requests   Final    BOTTLES DRAWN AEROBIC AND ANAEROBIC Blood Culture adequate volume   Culture   Final    NO GROWTH 5 DAYS Performed at [facility], [address] [place], [place] [address]    Report Status 06/13/2022 FINAL  Final  Resp panel by RT-PCR (RSV, Flu A&B, Covid) Anterior Nasal Swab     Status: None   Collection Time: 06/08/22  7:18 PM   Specimen: Anterior Nasal Swab  Result Value Ref Range Status   SARS Coronavirus 2 by RT PCR NEGATIVE NEGATIVE Final    Comment: (NOTE) SARS-CoV-2 target nucleic acids are NOT DETECTED.  The SARS-CoV-2 RNA is generally detectable in upper respiratory specimens during the acute phase of infection. The lowest concentration of SARS-CoV-2 viral copies this assay can detect is 138 copies/mL. A negative result does not preclude SARS-Cov-2 infection and should not be used as the sole basis for treatment or other patient management decisions. A negative result may occur with  improper specimen collection/handling, submission of specimen other than nasopharyngeal swab, presence of viral mutation(s) within the areas targeted by this assay, and inadequate number of viral copies(<138 copies/mL). A negative result must be combined with clinical observations, patient history, and epidemiological information. The expected result is Negative.  Fact Sheet for Patients:  EntrepreneurPulse.com.au  Fact Sheet for Healthcare Providers:  IncredibleEmployment.be  This test is no t yet approved or cleared by the [place] FDA and  has  been authorized for detection and/or diagnosis of SARS-CoV-2 by FDA under an Emergency Use Authorization (EUA). This EUA will remain  in effect (meaning this test can be used) for the duration of the COVID-19 declaration under Section 564(b)(1) of the Act, 21 U.S.C.section 360bbb-3(b)(1), unless the authorization is terminated  or revoked sooner.       Influenza A by PCR NEGATIVE NEGATIVE Final   Influenza B by PCR NEGATIVE NEGATIVE Final    Comment: (NOTE) The Xpert Xpress SARS-CoV-2/FLU/RSV plus assay is intended as an aid in the diagnosis of influenza from Nasopharyngeal swab specimens and should not be used as a sole basis for treatment. Nasal washings and aspirates are unacceptable for Xpert Xpress SARS-CoV-2/FLU/RSV testing.  Fact Sheet for Patients: EntrepreneurPulse.com.au  Fact Sheet for Healthcare Providers: IncredibleEmployment.be  This test is not yet approved or cleared by the [place] FDA and has been authorized for detection and/or  diagnosis of SARS-CoV-2 by FDA under an Emergency Use Authorization (EUA). This EUA will remain in effect (meaning this test can be used) for the duration of the COVID-19 declaration under Section 564(b)(1) of the Act, 21 U.S.C. section 360bbb-3(b)(1), unless the authorization is terminated or revoked.     Resp Syncytial Virus by PCR NEGATIVE NEGATIVE Final    Comment: (NOTE) Fact Sheet for Patients: EntrepreneurPulse.com.au  Fact Sheet for Healthcare Providers: IncredibleEmployment.be  This test is not yet approved or cleared by the [place] FDA and has been authorized for detection and/or diagnosis of SARS-CoV-2 by FDA under an Emergency Use Authorization (EUA). This EUA will remain in effect (meaning this test can be used) for the duration of the COVID-19 declaration under Section 564(b)(1) of the Act, 21 U.S.C. section 360bbb-3(b)(1), unless the  authorization is terminated or revoked.  Performed at [facility], [address], [place], [place] [address]   Urine Culture     Status: None   Collection Time: 06/08/22  9:34 PM   Specimen: In/Out Cath Urine  Result Value Ref Range Status   Specimen Description   Final    IN/OUT CATH URINE Performed at [facility], [address], [place], [place] [address]    Special Requests   Final    NONE Performed at [facility], [address], [place], [place] [address]    Culture   Final    NO GROWTH Performed at [facility], [place] [address], [place], [place] [address]    Report Status 06/10/2022 FINAL  Final  MRSA Next Gen by PCR, Nasal     Status: None   Collection Time: 06/10/22 11:49 PM   Specimen: Nasal Mucosa; Nasal Swab  Result Value Ref Range Status   MRSA by PCR Next Gen NOT DETECTED NOT DETECTED Final    Comment: (NOTE) The GeneXpert MRSA Assay (FDA approved for NASAL specimens only), is one component of a comprehensive MRSA colonization surveillance program. It is not intended to diagnose MRSA infection nor to guide or monitor treatment for MRSA infections. Test performance is not FDA approved in patients less than 65 years old. Performed at [facility], [place], [place] [address]   Respiratory (~20 pathogens) panel by PCR     Status: None   Collection Time: 06/12/22  6:15 AM   Specimen: Nasopharyngeal Swab; Respiratory  Result Value Ref Range Status   Adenovirus NOT DETECTED NOT DETECTED Final   Coronavirus 229E NOT DETECTED NOT DETECTED Final    Comment: (NOTE) The Coronavirus on the Respiratory Panel, DOES NOT test for the novel  Coronavirus (2019 nCoV)    Coronavirus HKU1 NOT DETECTED NOT DETECTED Final   Coronavirus NL63 NOT DETECTED NOT DETECTED Final   Coronavirus OC43 NOT DETECTED NOT DETECTED Final   Metapneumovirus NOT DETECTED NOT DETECTED Final   Rhinovirus / Enterovirus NOT DETECTED  NOT DETECTED Final   Influenza A NOT DETECTED NOT DETECTED Final   Influenza B NOT DETECTED NOT DETECTED Final   Parainfluenza Virus 1 NOT DETECTED NOT DETECTED Final   Parainfluenza Virus 2 NOT DETECTED NOT DETECTED Final   Parainfluenza Virus 3 NOT DETECTED NOT DETECTED Final   Parainfluenza Virus 4 NOT DETECTED NOT DETECTED Final   Respiratory Syncytial Virus NOT DETECTED NOT DETECTED Final   Bordetella pertussis NOT DETECTED NOT DETECTED Final   Bordetella Parapertussis NOT DETECTED NOT DETECTED Final   Chlamydophila pneumoniae NOT DETECTED NOT DETECTED Final   Mycoplasma  pneumoniae NOT DETECTED NOT DETECTED Final    Comment: Performed at [facility], [place] [address] [place], [place] [address]  Culture, Respiratory w Gram Stain     Status: None   Collection Time: 06/17/22  8:15 AM   Specimen: Tracheal Aspirate; Respiratory  Result Value Ref Range Status   Specimen Description   Final    TRACHEAL ASPIRATE Performed at [facility], [address] [place], [place] [address]    Special Requests   Final    NONE Performed at [facility], [address] [place], [place] [address]    Gram Stain   Final    RARE WBC PRESENT, PREDOMINANTLY PMN RARE SQUAMOUS EPITHELIAL CELLS PRESENT FEW YEAST Performed at [facility], [place] [address] [place], [place] [address]    Culture FEW CANDIDA ALBICANS  Final   Report Status 06/19/2022 FINAL  Final    Coagulation Studies: No results for input(s): "LABPROT", "INR" in the last 72 hours.  Urinalysis: No results for input(s): "COLORURINE", "LABSPEC", "PHURINE", "GLUCOSEU", "HGBUR", "BILIRUBINUR", "KETONESUR", "PROTEINUR", "UROBILINOGEN", "NITRITE", "LEUKOCYTESUR" in the last 72 hours.  Invalid input(s): "APPERANCEUR"    Imaging: DG Chest Port 1 View  Result Date: 06/21/2022 CLINICAL DATA:  Acute respiratory failure with hypoxia EXAM: PORTABLE CHEST 1 VIEW COMPARISON:  Yesterday FINDINGS: Left IJ line with  tip at the SVC. Improved aeration at the bases but there is still right more than left airspace opacity with elevated right diaphragm. Stable cardiomegaly. No pneumothorax. IMPRESSION: Improved lower lobe infiltrates. Electronically Signed   By: Jorje Guild M.D.   On: 06/21/2022 07:45   DG Chest Port 1 View  Result Date: 06/20/2022 CLINICAL DATA:  Acute on chronic respiratory failure with hypoxia and hypercapnia (HCC) EXAM: PORTABLE CHEST 1 VIEW COMPARISON:  Chest x-ray 06/17/2022. FINDINGS: Bibasilar opacities. Possible small right pleural effusion. No visible pneumothorax. Similar position of a left IJ approach central venous catheter. Polyarticular degenerative change. Unchanged cardiomediastinal silhouette, accentuated by technique. IMPRESSION: 1. Bibasilar opacities, which could represent atelectasis, aspiration, and/or pneumonia. 2. Possible small right pleural effusion. Electronically Signed   By: Margaretha Sheffield M.D.   On: 06/20/2022 08:16     Medications:    sodium chloride Stopped (06/17/22 2305)   acetaminophen Stopped (06/20/22 2343)   dextrose 50 mL/hr at 06/21/22 0900    budesonide (PULMICORT) nebulizer solution  0.5 mg Nebulization BID   Chlorhexidine Gluconate Cloth  6 each Topical Daily   fluocinonide-emollient   Topical QODAY   heparin  5,000 Units Subcutaneous Q8H   insulin aspart  0-20 Units Subcutaneous Q4H   ipratropium-albuterol  3 mL Nebulization Q6H   pantoprazole (PROTONIX) IV  40 mg Intravenous Q24H   acetaminophen, docusate sodium, fentaNYL (SUBLIMAZE) injection, ipratropium-albuterol, ondansetron **OR** ondansetron (ZOFRAN) IV, mouth rinse, phenol  Assessment/ Plan:  Samantha Mason is a 72 y.o.  female  with past medical history including COPD with renal failure requiring 2 L oxygen as needed, chronic diastolic heart failure, sleep apnea, morbid obesity, hypertension, and chronic kidney disease stage IV. Patient presents to the emergency department  feeling sick and was admitted for Sepsis [facility]) [A41.9] Cellulitis, unspecified cellulitis site [L03.90] Community acquired pneumonia, unspecified laterality [J18.9] Sepsis, due to unspecified organism, unspecified whether acute organ dysfunction present [facility]) [A41.9]   Acute Kidney Injury on chronic kidney disease stage IIIb with baseline creatinine 1.55 and GFR of 36 on 04/17/22.  Acute kidney injury secondary to severe infection. No IV contrast exposure.  Diuretics remain held. Lower extremity edema present, however patient has history of lymphedema.   Creatinine slightly elevated today, good UOP 2.4L as tolerated.    Lab Results  Component Value Date   CREATININE 1.83 (H) 06/21/2022   CREATININE 1.48 (H) 06/20/2022   CREATININE 1.54 (H) 06/19/2022    Intake/Output Summary (Last 24 hours) at 06/21/2022 1005 Last data filed at 06/21/2022 0900 Gross per 24 hour  Intake 1519.44 ml  Output 2304 ml  Net -784.56 ml    2. Anemia of chronic kidney disease Lab Results  Component Value Date   HGB 8.6 (L) 06/21/2022    Hgb slowly improving, Monitoring for now.  3.  Chronic diastolic heart failure.  Echo from 07/16/21 shows EF 55 to 60% with mild LVH.  Diuretics remain held  4.  Acute respiratory failure.  High flow [place], supportive care   LOS: [place] 2/2/202410:05 AM

## 2022-06-21 NOTE — Progress Notes (Signed)
PT Cancellation Note  Patient Details Name: Samantha Mason MRN: [id number] DOB: April 24, 1951   Cancelled Treatment:    Reason Eval/Treat Not Completed: Patient at procedure or test/unavailable. Patient is busy with nursing in room currently. Will re-attempt at later time/date.     Alfonzia Woolum 06/21/2022, 2:48 PM

## 2022-06-21 NOTE — Progress Notes (Addendum)
NAME:  Samantha Mason, MRN:  [id number], DOB:  1950-07-26, LOS: 33 ADMISSION DATE:  06/08/2022, CONSULTATION DATE:  06/11/2022 REFERRING MD:  Dr. Manuella Ghazi, CHIEF COMPLAINT:  Altered Mental Status, Hypercapnia  Brief Pt Description / Synopsis:  72 y.o. female with PMHx significant for HFpEF, COPD, OSA (noncompliant with CPAP), CKD Stage III who is  admitted with Sepsis in setting of lower extremity Cellulitis, AKI, and  Acute on Chronic Hypercapnic Respiratory Failure in setting AECOPD.  Course complicated by CO2 narcosis, failed BiPAP requiring intubation and mechanical ventilation.  History of Present Illness:  Samantha Mason is a 72 y.o. female with medical history significant for Morbid obesity, chronic HFpEF,CKD stage III, chronic lower extremity lymphedema on treatment with torsemide, hypertension, asthma, stroke (1995) and obstructive sleep apnea(non compliant with cpap) who presented to [facility] ED on 06/08/22 due to shortness of breath and generalized malaise for appropriately 1 week.  Pt is currently altered and no family currently available, therefore history is obtained from chart review.  Per ED and nursing notes, she reported about a 1 week history of feeling weak and fatigued, chills, bilateral lower extremity swelling and erythema (R>L), paroxsymal nocturnal dyspnea and orthopnea, weight gain with increased oxygen requirements.  She denied abdominal pain, N/V/D, dysuria.  ED Course: Initial Vital Signs: 105F orally, BP 129/53, HR 109, RR 27, SpO2 96% Significant Labs: BUN 39, Creatinine 1.59, Albumin 2.4, lactic acid 1.1, WBC 12.9, Hgb 8.0, Hct 27.7, Hgb A1c 5.8 COVID/FLU/RSV negative UA negative for UTI Imaging Chest X-ray>>IMPRESSION: Mild cardiomegaly with central pulmonary vascular congestion. CT Chest/Abdomen/Pelvis>> IMPRESSION: 1. Skin thickening and soft tissue edema of the partially imaged proximal right thigh. Correlate for cellulitis. 2. Newly enlarged bilateral inguinal  and iliac lymph nodes, likely reactive. 3. Diffuse bilateral bronchial wall thickening and mosaic attenuation of the airspaces, consistent with nonspecific infectious or inflammatory bronchitis. 4. Cardiomegaly.  She met Sepsis criteria, therefore she received IV fluids and antibiotics. Hospitalists were asked to admit for further workup and treatment os sepsis due to cellulitis.  Please see "significant hospital events" section below for full detailed hospital course.  Pertinent  Medical History   Past Medical History:  Diagnosis Date   Acute bronchitis    Anemia    Arthritis    Asthma    B12 deficiency 03/14/2017   Chest pain 07/02/2018   Chest pain    a. 06/2018 MV: EF 66%. No ischemia/infarct; b. 02/2021 PET CT: EF>60%, no ischemia/infarct.   Chronic heart failure with preserved ejection fraction (HFpEF) ([place])    a. 08/2020 Echo: EF 55-60%, GrI DD; b. 06/2021 Echo: EF 55-60%, mild LVH, nl RV fxn, no signif valvular dzs.   COPD (chronic obstructive pulmonary disease) ([place]) 07/24/2018   Family history of blood clots 07/16/2017   Irritable bowel syndrome    Malignant hypertension    Obesity    OSA (obstructive sleep apnea)    uses cpap   Pneumonia 2017   Restless leg syndrome    Stroke ([place]) 1995   mild    Micro Data:  1/20: SARS-CoV-2/influenza/RSV PCR>> negative 1/20: Blood culture x 2>> no growth  1/20: Urine>> negative 1/22: MRSA PCR>>negative  1/24: Respiratory viral panel>>negative  1/26: Tracheal aspirate>> not collected 1/29: Tracheal aspirate>> few candida albicans 2/02: GI panel>>  Antimicrobials:   Anti-infectives (From admission, onward)    Start     Dose/Rate Route Frequency Ordered Stop   06/14/22 1400  ceFEPIme (MAXIPIME) 2 g in sodium chloride 0.9 % 100  mL IVPB        2 g 200 mL/hr over 30 Minutes Intravenous Every 12 hours 06/14/22 1301 06/21/22 0236   06/13/22 1000  ceFEPIme (MAXIPIME) 2 g in sodium chloride 0.9 % 100 mL IVPB  Status:   Discontinued        2 g 200 mL/hr over 30 Minutes Intravenous Every 24 hours 06/12/22 1430 06/13/22 0747   06/13/22 0900  cefTRIAXone (ROCEPHIN) 2 g in sodium chloride 0.9 % 100 mL IVPB  Status:  Discontinued        2 g 200 mL/hr over 30 Minutes Intravenous Every 24 hours 06/13/22 0747 06/14/22 1230   06/11/22 1145  vancomycin (VANCOREADY) IVPB 2000 mg/400 mL        2,000 mg 200 mL/hr over 120 Minutes Intravenous  Once 06/11/22 1047 06/11/22 1344   06/11/22 1145  ceFEPIme (MAXIPIME) 2 g in sodium chloride 0.9 % 100 mL IVPB  Status:  Discontinued        2 g 200 mL/hr over 30 Minutes Intravenous 2 times daily 06/11/22 1047 06/12/22 1430   06/10/22 2300  vancomycin (VANCOCIN) IVPB 1000 mg/200 mL premix  Status:  Discontinued       See Hyperspace for full Linked Orders Report.   1,000 mg 200 mL/hr over 60 Minutes Intravenous Every 48 hours 06/08/22 2307 06/09/22 0922   06/10/22 2300  vancomycin (VANCOREADY) IVPB 1250 mg/250 mL  Status:  Discontinued       See Hyperspace for full Linked Orders Report.   1,250 mg 166.7 mL/hr over 90 Minutes Intravenous Every 48 hours 06/08/22 2307 06/09/22 0922   06/10/22 1800  cephALEXin (KEFLEX) capsule 500 mg  Status:  Discontinued        500 mg Oral Every 6 hours 06/10/22 1521 06/11/22 1047   06/09/22 2200  vancomycin (VANCOREADY) IVPB 750 mg/150 mL  Status:  Discontinued        750 mg 150 mL/hr over 60 Minutes Intravenous Every 24 hours 06/09/22 0923 06/10/22 1521   06/09/22 1000  ceFEPIme (MAXIPIME) 2 g in sodium chloride 0.9 % 100 mL IVPB  Status:  Discontinued        2 g 200 mL/hr over 30 Minutes Intravenous Every 12 hours 06/09/22 0916 06/10/22 1521   06/08/22 2300  vancomycin (VANCOCIN) IVPB 1000 mg/200 mL premix  Status:  Discontinued        1,000 mg 200 mL/hr over 60 Minutes Intravenous  Once 06/08/22 2257 06/08/22 2307   06/08/22 2215  vancomycin (VANCOCIN) IVPB 1000 mg/200 mL premix       See Hyperspace for full Linked Orders Report.   1,000  mg 200 mL/hr over 60 Minutes Intravenous  Once 06/08/22 2210 06/09/22 0014   06/08/22 2215  vancomycin (VANCOREADY) IVPB 1500 mg/300 mL       See Hyperspace for full Linked Orders Report.   1,500 mg 150 mL/hr over 120 Minutes Intravenous  Once 06/08/22 2210 06/10/22 0807   06/08/22 1915  piperacillin-tazobactam (ZOSYN) IVPB 3.375 g        3.375 g 100 mL/hr over 30 Minutes Intravenous  Once 06/08/22 1909 06/08/22 [place] [facility] Events: Including procedures, antibiotic start and stop dates in addition to other pertinent events   1/20: Presented to ED, admitted by [facility] 1/21: Added cefepime, continue vancomycin, increase dose of Cardizem for better heart rate control 1/22: Had to reduce Cardizem dose back to her home dose as blood pressure dropped 1/23:  Transferred to ICU overnight due to CO2 narcosis, placed on BiPAP.  PCCM consulted.  Worsening AKI, holding diuresis.  Agitation requiring Precedex, high risk for Intubation.  Palliative Care consulted for [place] conversations.  ABX broadened back to Cefepime and Vancomycin 1/24: Pt on Bipap but awake and able to follow commands.  Will attempt to transition off Bipap.  Palliative Care following along to assist with goals of care conversations  1/25: Pt currently on Bipap and precedex gtt.  Required 2.5 mg iv valium overnight for delirium.  Unable to obtain CT Head due to respiratory status.  Ultimately required intubation. 1/26: Remains critically ill. Issues with vent dyschrony and ventilation, plan to start Nimbex gtt. Creatinine improved with diuresis, continue with diuresis, Nephrology considering Lasix gtt. 1/27: ABG's improved on Nimbex, persistent cuff leak, exchanged ETT. Decadron started due to upper airway swelling. Will attempt to d/c Nimbex. Good diuresis with Lasix gtt with 4.5 L of UOP last 24 hrs, Creatinine improving. 1/28: Continues with good diuresis on Lasix gtt, 3L of UOP over preceding 24 hrs, creatinine  continues to improve.  Vent requirements slowly improving. 1/29: No acute events overnight. Remains mechanically intubated with minimal vent settings: PEEP 5/FiO2 40%.  Remains on lasix gtt '@2mg'$ /hr.  Not on sedation and attempting to follow commands but extremely weak.  Will perform SBT as tolerated   1/30: No acute events overnight.  Pt awake and following commands on precedex gtt '@0'$ .2 mcg/kg/hr.  Lasix gtt '@4'$  mg/hr. Tolerating SBT 15/8 1/31: No acute events, awake and following commands on low dose precedex.  Remains on Lasix gtt @ '4mg'$ /hr,  Creatinine remains stable BUN slowly uptrending. Tolerating SBT 15/8 ~ will continue to wean PSV as tolerated ~ EXTUBATED. Lasix gtt d/c following extubation.  Following extubation, pt confirmed DNR/DNI status in presence of her son at bedside. 2/1: Tolerated BiPAP overnight, weaned to [facility] this am.  Speech evaluation performed, which pt FAILED. Consult PT/OT.  Creatinine continues to improve, started on D5W @ 50 ml/hr due to Hypernatremia of 155.  Holding diuresis today 2/2: Pt febrile currently on Bipap '@50'$ %.  Pt awake and following commands.  Failed speech evaluation on 02/1 and declining NGT placement.   Interim History / Subjective:  Pt resting comfortably on Bipap '@50'$ %.  She is following commands and nodding yes/no appropriately.  She is extremely weak.  When asked if she wants to continue aggressive treatment she shakes her head no.  She also shook her head no to CPR and intubation.  When asked if she would like to focus on comfort she shakes her head yes.    Objective   Blood pressure 129/66, pulse (!) 105, temperature (!) 101.2 F (38.4 C), resp. rate (!) 24, height '5\' 4"'$  (1.626 m), weight (!) 140 kg, SpO2 96 %.    FiO2 (%):  [40 %-50 %] 50 %   Intake/Output Summary (Last 24 hours) at 06/21/2022 0721 Last data filed at 06/21/2022 0600 Gross per 24 hour  Intake 1514.33 ml  Output 2604 ml  Net -1089.67 ml   Filed Weights   06/19/22 0353 06/20/22 0500  06/21/22 0500  Weight: (!) 140.3 kg (!) 144.2 kg (!) 140 kg    Examination: General: Acute on chronically ill-appearing obese female, laying in bed, NAD on Bipap  HENT: Supple, difficult to assess JVD due to body habitus  Lungs: Diminished breath sounds throughout, even, non labored  Cardiovascular: Sinus tachycardia, s1s2, no m/r/g, 2+ radial/1+ distal pulses, 1+ generalized edema   Abdomen: +  BS x4, obese, non distended, non tender  Extremities: Bilateral LE lymphedema with unna boots in place Neuro: Awake and oriented, following commands, voice is extremely hoarse, PERRL GU: Indwelling foley catheter draining clear yellow urine  Skin: Cellulitis to bilateral lower extremities, yeast excoriation to abdominal fold/groins  Resolved Hospital Problem list     Assessment & Plan:   #Acute on Chronic Hypercapnic Respiratory Failure in setting of AECOPD & acute CHF exacerbation vs ARDS PMHx: OSA (noncompliant with CPAP at home), morbid obesity with probable OHS EXTUBATED 06/19/22 - Supplemental O2 as needed to maintain O2 sats 88 to 92% - BiPAP, wean as tolerated ~ BiPAP qhs  (pt confirms DNR/DNI) - Follow intermittent Chest X-ray & ABG as needed - Bronchodilators & Pulmicort nebs - Completed course of IV Steroids - Completed course of abx therapy  - Diuresis as BP and renal function permits ~ holding 2/2 pt currently net negative  - Aggressive Pulmonary toilet as able - PT/OT consulted to mobilize as tolerated  #Acute on Chronic HFpEF #Hypotension ~ RESOLVED PMHx: HTN Echocardiogram 06/12/22: LVEF 60-65%, Grade III DD, RV systolic function normal, mild to moderate MR - Continuous cardiac monitoring - Maintain MAP >65 - Vasopressors as needed to maintain MAP goal ~ WEANED OFF - Diuresis as BP and renal function permits to keep even~ Holding diuresis 2/2 pt currently net negative  - Nephrology following, appreciate input  #Sepsis secondary to Cellulitis of Lower extremities #Fevers   #Diarrhea  (Meets SIRS Criteria: HR >90, RR >20, fever) - Trend WBC and monitor fever curve - Completed coarse of abx  - Trend PCT  - GI panel pending  - Bilateral lower extremity venous US to r/o DVT   #Acute Kidney Injury on CKD III ~ IMPROVING #Hypernatremia ~ WORSENED - Trend BMP  - Replace electrolytes as indicated  - Monitor UOP - Avoid nephrotoxic medication as able  - Nephrology following, appreciate input  - Continue D5W @ 50 cc/hr, holding diuresis 2/2  #Anemia of Chronic Disease - Trend CBC - Monitor for s/sx of bleeding  - Transfuse for hgb <7  #Endo - CBG's q4hrs  - SSI   #Acute Metabolic Encephalopathy in the setting of CO2 Narcosis and multiple metabolic derangements ~ RESOLVED CT Head 1/25 negative for acute intracranial abnormality EEG 1/26 negative for seizures - Treatment of metabolic derangements and hypercapnia as outlined above - Provide supportive care - Promote normal sleep/wake cycle and family presence - Avoid sedating meds as able - PT/OT  Best Practice (right click and "Reselect all SmartList Selections" daily)   Diet/type: NPO, Pt failed swallowing evaluation on 02/1 and has refused NGT placement  DVT prophylaxis: prophylactic heparin  GI prophylaxis: PPI Lines: Left IJ, and is still needed Foley: Orders placed to discontinue 02/2 Code Status:  DNR/DNI Last date of multidisciplinary goals of care discussion [06/21/22]  2/2: Will updated pts family when they arrive at bedside.  Palliative Care consulted appreciate input    CBC: Recent Labs  Lab 06/17/22 0406 06/18/22 0423 06/19/22 0343 06/20/22 0444 06/21/22 0500  WBC 11.7* 10.9* 11.3* 12.5* 12.2*  HGB 8.4* 8.6* 8.4* 8.4* 8.6*  HCT 28.1* 29.0* 29.0* 30.1* 30.6*  MCV 80.3 81.7 83.1 86.5 86.7  PLT 194 202 191 191 563    Basic Metabolic Panel: Recent Labs  Lab 06/17/22 0406 06/18/22 0423 06/19/22 0343 06/20/22 0444 06/21/22 0500 06/21/22 0530  NA 145 146* 149* 155*  --   158*  K 4.5 3.6 3.9 3.5  --  4.0  CL 100 101 104 106  --  112*  CO2 32 35* 37* 39*  --  37*  GLUCOSE 179* 130* 138* 129*  --  111*  BUN 101* 116* 123* 109*  --  87*  CREATININE 1.60* 1.52* 1.54* 1.48*  --  1.83*  CALCIUM 8.8* 8.9 9.2 9.3  --  9.1  MG 1.5* 2.2 2.0 2.2 2.3  --   PHOS 2.8 2.6 3.3 4.4  --  3.9   GFR: Estimated Creatinine Clearance: 40.1 mL/min (A) (by C-G formula based on SCr of 1.83 mg/dL (H)). Recent Labs  Lab 06/15/22 0404 06/16/22 0400 06/17/22 0406 06/18/22 0423 06/19/22 0343 06/20/22 0444 06/21/22 0500  PROCALCITON 0.37 0.27 0.20  --   --   --   --   WBC 14.0* 25.1* 11.7* 10.9* 11.3* 12.5* 12.2*    Liver Function Tests: Recent Labs  Lab 06/17/22 0406 06/18/22 0423 06/19/22 0343 06/20/22 0444 06/21/22 0530  ALBUMIN 2.5* 2.6* 2.5* 2.7* 2.6*   No results for input(s): "LIPASE", "AMYLASE" in the last 168 hours. No results for input(s): "AMMONIA" in the last 168 hours.   ABG    Component Value Date/Time   PHART 7.43 06/17/2022 1026   PCO2ART 18 (LL) 06/17/2022 1026   PO2ART 149 (H) 06/17/2022 1026   HCO3 11.9 (L) 06/17/2022 1026   ACIDBASEDEF 9.8 (H) 06/17/2022 1026   O2SAT 99.1 06/17/2022 1026     Coagulation Profile: No results for input(s): "INR", "PROTIME" in the last 168 hours.   Cardiac Enzymes: No results for input(s): "CKTOTAL", "CKMB", "CKMBINDEX", "TROPONINI" in the last 168 hours.  HbA1C: Hgb A1c MFr Bld  Date/Time Value Ref Range Status  06/08/2022 07:16 AM 5.8 (H) 4.8 - 5.6 % Final    Comment:    (NOTE) Pre diabetes:          5.7%-6.4%  Diabetes:              >6.4%  Glycemic control for   <7.0% adults with diabetes   07/15/2021 04:30 PM 5.4 4.8 - 5.6 % Final    Comment:    (NOTE) Pre diabetes:          5.7%-6.4%  Diabetes:              >6.4%  Glycemic control for   <7.0% adults with diabetes     CBG: Recent Labs  Lab 06/20/22 1132 06/20/22 1552 06/20/22 1925 06/20/22 2338 06/21/22 0330  GLUCAP 95  112* 120* 109* 110*    Review of Systems:   Positives in BOLD: Gen: Denies fever, chills, weight change, fatigue, night sweats HEENT: Denies blurred vision, double vision, hearing loss, tinnitus, sinus congestion, rhinorrhea, sore throat, neck stiffness, dysphagia PULM: Denies shortness of breath, cough, sputum production, hemoptysis, wheezing CV: Denies chest pain, edema, orthopnea, paroxysmal nocturnal dyspnea, palpitations GI: Denies abdominal pain, nausea, vomiting, diarrhea, hematochezia, melena, constipation, change in bowel habits GU: Denies dysuria, hematuria, polyuria, oliguria, urethral discharge Endocrine: Denies hot or cold intolerance, polyuria, polyphagia or appetite change Derm: Denies rash, dry skin, scaling or peeling skin change Heme: Denies easy bruising, bleeding, bleeding gums Neuro: headache, numbness, weakness, slurred speech, loss of memory or consciousness   Past Medical History:  She,  has a past medical history of Acute bronchitis, Anemia, Arthritis, Asthma, B12 deficiency (03/14/2017), Chest pain (07/02/2018), Chest pain, Chronic heart failure with preserved ejection fraction (HFpEF) (HCC), COPD (chronic obstructive pulmonary disease) ([place]) (07/24/2018), Family history of blood clots (07/16/2017), Irritable bowel syndrome,  Malignant hypertension, Obesity, OSA (obstructive sleep apnea), Pneumonia (2017), Restless leg syndrome, and Stroke ([place]) (1995).   Surgical History:   Past Surgical History:  Procedure Laterality Date   CHOLECYSTECTOMY     COLONOSCOPY WITH PROPOFOL N/A 03/31/2018   Procedure: COLONOSCOPY WITH PROPOFOL;  Surgeon: Lucilla Lame, MD;  Location: [facility] ENDOSCOPY;  Service: Endoscopy;  Laterality: N/A;   KNEE ARTHROPLASTY Right 04/30/2019   Procedure: COMPUTER ASSISTED TOTAL KNEE ARTHROPLASTY;  Surgeon: Dereck Leep, MD;  Location: [facility] ORS;  Service: Orthopedics;  Laterality: Right;   RIGHT HEART CATH N/A 01/30/2022   Procedure: RIGHT HEART  CATH;  Surgeon: Nelva Bush, MD;  Location: [place] CV LAB;  Service: Cardiovascular;  Laterality: N/A;   saliva gland removal  1998   TOTAL KNEE ARTHROPLASTY Left 2014   TUBAL LIGATION Bilateral      Social History:   reports that she has never smoked. She has never used smokeless tobacco. She reports that she does not drink alcohol and does not use drugs.   Family History:  Her family history includes COPD in her father; Coronary artery disease (age of onset: 34) in her brother; Dementia in her mother; Diabetes in her father; Heart failure in her brother and maternal grandmother; Stroke in her maternal grandmother. There is no history of Breast cancer.   Allergies Allergies  Allergen Reactions   Nuvigil [Armodafinil] Hives   Penicillins Diarrhea and Nausea And Vomiting    Did it involve swelling of the face/tongue/throat, SOB, or low BP? no Did it involve sudden or severe rash/hives, skin peeling, or any reaction on the inside of your mouth or nose? No Did you need to seek medical attention at a hospital or doctor's office? No When did it last happen?  in her 52s    If all above answers are "NO", may proceed with cephalosporin use.    Spironolactone Itching   Gabapentin Other (See Comments)    "wiped her out, couldn't stay awake"   Melatonin Other (See Comments)    "Brain fog"   Provigil [Modafinil] Hives   Entresto [Sacubitril-Valsartan] Itching   Lisinopril Cough     Home Medications  Prior to Admission medications   Medication Sig Start Date End Date Taking? Authorizing Provider  acetaminophen (TYLENOL) 500 MG tablet Take 1,000 mg by mouth every 6 (six) hours as needed.   Yes [provider]  benzonatate (TESSALON) 100 MG capsule TAKE 1 CAPSULE BY MOUTH THREE TIMES A DAY AS NEEDED FOR COUGH 03/08/22  Yes Tyler Pita, MD  calcitRIOL (ROCALTROL) 0.25 MCG capsule Take 0.25 mcg by mouth daily. 02/22/22  Yes [provider]  dapagliflozin  propanediol (FARXIGA) 10 MG TABS tablet Take 1 tablet (10 mg total) by mouth daily before breakfast. 03/07/22  Yes Hackney, Tina A, FNP  diltiazem (CARDIZEM CD) 240 MG 24 hr capsule Take 1 capsule (240 mg total) by mouth daily. 09/05/21  Yes Theora Gianotti, NP  ipratropium-albuterol (DUONEB) 0.5-2.5 (3) MG/3ML SOLN TAKE 3 MLS BY NEBULIZATION EVERY 6 (SIX) HOURS AS NEEDED. J45.40 03/04/22  Yes Tyler Pita, MD  metolazone (ZAROXOLYN) 2.5 MG tablet Take 1 tablet (2.5 mg total) by mouth 2 (two) times a week. Patient taking differently: Take 2.5 mg by mouth 2 (two) times a week. Monday and Friday 02/04/22  Yes Wouk, Ailene Rud, MD  ondansetron [facility]) 4 MG tablet Take 1 tablet (4 mg total) by mouth daily as needed for nausea or vomiting. 05/10/22 05/10/23 Yes Lorella Nimrod,  MD  pantoprazole (PROTONIX) 40 MG tablet TAKE 1 TABLET BY MOUTH EVERY DAY 12/11/21  Yes Glean Hess, MD  PREVALITE 4 g packet DISSOLVE AND DRINK 1 PACKET BY MOUTH AT BEDTIME 03/05/22  Yes Glean Hess, MD  rOPINIRole (REQUIP) 1 MG tablet Take 1 tablet (1 mg total) by mouth 3 (three) times daily. 02/13/22  Yes Glean Hess, MD  Torsemide 40 MG TABS Take 40 mg by mouth daily.   Yes [provider]  budesonide (PULMICORT) 0.5 MG/2ML nebulizer solution TAKE 2 ML (0.5 MG TOTAL) BY NEBULIZATION TWICE A DAY Patient not taking: Reported on 06/08/2022 10/29/21   Tyler Pita, MD  feeding supplement (ENSURE ENLIVE / ENSURE PLUS) LIQD Take 237 mLs by mouth 3 (three) times daily between meals. 05/10/22   Lorella Nimrod, MD  metoCLOPramide (REGLAN) 5 MG tablet Take 1 tablet (5 mg total) by mouth 3 (three) times daily before meals. Patient not taking: Reported on 06/08/2022 05/12/22   Sharen Hones, MD  NON FORMULARY Bipap - nightly with Oxygen 2 Lpm    [provider]  oxyCODONE 7.5 MG TABS Take 7.5 mg by mouth every 6 (six) hours as needed. 05/10/22 05/10/23  Lorella Nimrod, MD  OXYGEN Inhale  into the lungs. 2 LPM [place]    [provider]  promethazine (PHENERGAN) 25 MG tablet Take 1 tablet (25 mg total) by mouth every 6 (six) hours as needed. Patient not taking: Reported on 06/08/2022 05/10/22 05/10/23  Lorella Nimrod, MD     Critical care time: 40 minutes    Darel Hong, AGACNP-BC Donell Beers, [place] Pager [phone] (please enter 7 digits) PCCM Consult Pager [phone] (please enter 7 digits)

## 2022-06-22 DIAGNOSIS — A419 Sepsis, unspecified organism: Secondary | ICD-10-CM | POA: Diagnosis not present

## 2022-06-22 DIAGNOSIS — G9341 Metabolic encephalopathy: Secondary | ICD-10-CM | POA: Diagnosis not present

## 2022-06-22 DIAGNOSIS — J9621 Acute and chronic respiratory failure with hypoxia: Secondary | ICD-10-CM | POA: Diagnosis not present

## 2022-06-22 DIAGNOSIS — I5032 Chronic diastolic (congestive) heart failure: Secondary | ICD-10-CM | POA: Diagnosis not present

## 2022-06-22 DIAGNOSIS — E662 Morbid (severe) obesity with alveolar hypoventilation: Secondary | ICD-10-CM

## 2022-06-22 LAB — CBC WITH DIFFERENTIAL/PLATELET
Abs Immature Granulocytes: 0.06 10*3/uL (ref 0.00–0.07)
Basophils Absolute: 0 10*3/uL (ref 0.0–0.1)
Basophils Relative: 0 %
Eosinophils Absolute: 0.4 10*3/uL (ref 0.0–0.5)
Eosinophils Relative: 3 %
HCT: 29.6 % — ABNORMAL LOW (ref 36.0–46.0)
Hemoglobin: 8.3 g/dL — ABNORMAL LOW (ref 12.0–15.0)
Immature Granulocytes: 1 %
Lymphocytes Relative: 13 %
Lymphs Abs: 1.5 10*3/uL (ref 0.7–4.0)
MCH: 24.1 pg — ABNORMAL LOW (ref 26.0–34.0)
MCHC: 28 g/dL — ABNORMAL LOW (ref 30.0–36.0)
MCV: 85.8 fL (ref 80.0–100.0)
Monocytes Absolute: 0.9 10*3/uL (ref 0.1–1.0)
Monocytes Relative: 8 %
Neutro Abs: 8.8 10*3/uL — ABNORMAL HIGH (ref 1.7–7.7)
Neutrophils Relative %: 75 %
Platelets: 207 10*3/uL (ref 150–400)
RBC: 3.45 MIL/uL — ABNORMAL LOW (ref 3.87–5.11)
RDW: 19.6 % — ABNORMAL HIGH (ref 11.5–15.5)
WBC: 11.8 10*3/uL — ABNORMAL HIGH (ref 4.0–10.5)
nRBC: 0 % (ref 0.0–0.2)

## 2022-06-22 LAB — BLOOD GAS, VENOUS
Acid-Base Excess: 12.7 mmol/L — ABNORMAL HIGH (ref 0.0–2.0)
Bicarbonate: 38.6 mmol/L — ABNORMAL HIGH (ref 20.0–28.0)
FIO2: 35 %
O2 Content: 40 L/min
O2 Saturation: 78.2 %
Patient temperature: 37
pCO2, Ven: 53 mmHg (ref 44–60)
pH, Ven: 7.47 — ABNORMAL HIGH (ref 7.25–7.43)
pO2, Ven: 46 mmHg — ABNORMAL HIGH (ref 32–45)

## 2022-06-22 LAB — MAGNESIUM: Magnesium: 2.3 mg/dL (ref 1.7–2.4)

## 2022-06-22 LAB — BASIC METABOLIC PANEL
Anion gap: 10 (ref 5–15)
BUN: 94 mg/dL — ABNORMAL HIGH (ref 8–23)
CO2: 33 mmol/L — ABNORMAL HIGH (ref 22–32)
Calcium: 9.1 mg/dL (ref 8.9–10.3)
Chloride: 108 mmol/L (ref 98–111)
Creatinine, Ser: 2.48 mg/dL — ABNORMAL HIGH (ref 0.44–1.00)
GFR, Estimated: 20 mL/min — ABNORMAL LOW (ref >60)
Glucose, Bld: 129 mg/dL — ABNORMAL HIGH (ref 70–99)
Potassium: 3.3 mmol/L — ABNORMAL LOW (ref 3.5–5.1)
Sodium: 151 mmol/L — ABNORMAL HIGH (ref 135–145)

## 2022-06-22 LAB — GLUCOSE, CAPILLARY
Glucose-Capillary: 101 mg/dL — ABNORMAL HIGH (ref 70–99)
Glucose-Capillary: 109 mg/dL — ABNORMAL HIGH (ref 70–99)
Glucose-Capillary: 113 mg/dL — ABNORMAL HIGH (ref 70–99)
Glucose-Capillary: 116 mg/dL — ABNORMAL HIGH (ref 70–99)
Glucose-Capillary: 129 mg/dL — ABNORMAL HIGH (ref 70–99)
Glucose-Capillary: 93 mg/dL (ref 70–99)
Glucose-Capillary: 99 mg/dL (ref 70–99)

## 2022-06-22 LAB — PROCALCITONIN: Procalcitonin: 0.58 ng/mL

## 2022-06-22 LAB — PHOSPHORUS: Phosphorus: 3.8 mg/dL (ref 2.5–4.6)

## 2022-06-22 MED ORDER — FENTANYL CITRATE PF 50 MCG/ML IJ SOSY
25.0000 ug | PREFILLED_SYRINGE | INTRAMUSCULAR | Status: DC | PRN
  Administered 2022-06-22 – 2022-06-23 (×5): 25 ug via INTRAVENOUS
  Filled 2022-06-22 (×5): qty 1

## 2022-06-22 MED ORDER — BLISTEX MEDICATED EX OINT
TOPICAL_OINTMENT | CUTANEOUS | Status: DC | PRN
  Filled 2022-06-22: qty 6.3

## 2022-06-22 MED ORDER — POTASSIUM CHLORIDE 10 MEQ/100ML IV SOLN
10.0000 meq | INTRAVENOUS | Status: AC
  Administered 2022-06-22 (×2): 10 meq via INTRAVENOUS
  Filled 2022-06-22 (×2): qty 100

## 2022-06-22 MED ORDER — DEXTROSE 5 % IV SOLN: INTRAVENOUS | Status: DC

## 2022-06-22 NOTE — Progress Notes (Signed)
[facility]  ROUNDING NOTE   Subjective:   Samantha Mason  is a 72 year old female with past medical history including COPD with renal failure requiring 2 L oxygen as needed, chronic diastolic heart failure, sleep apnea, morbid obesity, hypertension, and chronic kidney disease stage IV. Patient presents to the emergency department feeling sick for a few days.  She was admitted for Sepsis [facility]) [A41.9] Cellulitis, unspecified cellulitis site [L03.90] Community acquired pneumonia, unspecified laterality [J18.9] Sepsis, due to unspecified organism, unspecified whether acute organ dysfunction present [facility]) [A41.9]  Patient is followed by our office outpatient. She was last seen in office on Apr 17, 2022.   Patient resting quietly in ICU Alert and oriented, on family at bedside HFNC in place, denies shortness of breath Complains of bilateral foot pain Lower extremity edema improved Tube feeds held, patient refusing feeding tube  02/02 0701 - 02/03 0700 In: 2583.6 [I.V.:2483.6; IV Piggyback:100] Out: 175 [Urine:25; Stool:150] Lab Results  Component Value Date   CREATININE 2.48 (H) 06/22/2022   CREATININE 1.83 (H) 06/21/2022   CREATININE 1.48 (H) 06/20/2022      Objective:  Vital signs in last 24 hours:  Temp:  [98 F (36.7 C)-100.6 F (38.1 C)] 98.2 F (36.8 C) (02/03 0730) Pulse Rate:  [78-97] 93 (02/03 0800) Resp:  [16-25] 24 (02/03 0800) BP: (113-162)/(48-84) 147/51 (02/03 0800) SpO2:  [92 %-100 %] 98 % (02/03 0800) FiO2 (%):  [33 %-50 %] 35 % (02/03 0729) Weight:  [141.4 kg] 141.4 kg (02/03 0514)  Weight change: 1.4 kg Filed Weights   06/20/22 0500 06/21/22 0500 06/22/22 0514  Weight: (!) 144.2 kg (!) 140 kg (!) 141.4 kg    Intake/Output: I/O last 3 completed shifts: In: 3342.5 [I.V.:3042.5; IV Piggyback:300] Out: 6568 [Urine:804; Stool:250]   Intake/Output this shift:  Total I/O In: 320.9 [I.V.:320.9] Out: 0   Physical Exam: General:  ill-appearing  Head: Normocephalic, atraumatic.    Eyes: Anicteric  Lungs:  coarse breath sounds bilateral, HFNC  Heart: Regular rate and rhythm  Abdomen:  Soft, nontender  Extremities: 2+ peripheral edema.  Neurologic: Alert and oriented  Skin: No lesions  Access: No hemodialysis access    Basic Metabolic Panel: Recent Labs  Lab 06/18/22 0423 06/19/22 0343 06/20/22 0444 06/21/22 0500 06/21/22 0530 06/22/22 0455  NA 146* 149* 155*  --  158* 151*  K 3.6 3.9 3.5  --  4.0 3.3*  CL 101 104 106  --  112* 108  CO2 35* 37* 39*  --  37* 33*  GLUCOSE 130* 138* 129*  --  111* 129*  BUN 116* 123* 109*  --  87* 94*  CREATININE 1.52* 1.54* 1.48*  --  1.83* 2.48*  CALCIUM 8.9 9.2 9.3  --  9.1 9.1  MG 2.2 2.0 2.2 2.3  --  2.3  PHOS 2.6 3.3 4.4  --  3.9 3.8     Liver Function Tests: Recent Labs  Lab 06/17/22 0406 06/18/22 0423 06/19/22 0343 06/20/22 0444 06/21/22 0530  ALBUMIN 2.5* 2.6* 2.5* 2.7* 2.6*    No results for input(s): "LIPASE", "AMYLASE" in the last 168 hours. No results for input(s): "AMMONIA" in the last 168 hours.   CBC: Recent Labs  Lab 06/18/22 0423 06/19/22 0343 06/20/22 0444 06/21/22 0500 06/22/22 0455  WBC 10.9* 11.3* 12.5* 12.2* 11.8*  NEUTROABS  --   --   --   --  8.8*  HGB 8.6* 8.4* 8.4* 8.6* 8.3*  HCT 29.0* 29.0* 30.1* 30.6* 29.6*  MCV 81.7 83.1 86.5 86.7 85.8  PLT 202 191 191 219 207     Cardiac Enzymes: No results for input(s): "CKTOTAL", "CKMB", "CKMBINDEX", "TROPONINI" in the last 168 hours.  BNP: Invalid input(s): "POCBNP"  CBG: Recent Labs  Lab 06/21/22 1608 06/21/22 1938 06/21/22 2346 06/22/22 0331 06/22/22 0744  GLUCAP 113* 110* 118* 129* 113*     Microbiology: Results for orders placed or performed during the hospital encounter of 06/08/22  Blood Culture (routine x 2)     Status: None   Collection Time: 06/08/22  7:18 PM   Specimen: BLOOD  Result Value Ref Range Status   Specimen Description BLOOD BLOOD LEFT ARM   Final   Special Requests   Final    BOTTLES DRAWN AEROBIC AND ANAEROBIC Blood Culture adequate volume   Culture   Final    NO GROWTH 5 DAYS Performed at [facility], [address] [place], [place] [address]    Report Status 06/13/2022 FINAL  Final  Blood Culture (routine x 2)     Status: None   Collection Time: 06/08/22  7:18 PM   Specimen: BLOOD  Result Value Ref Range Status   Specimen Description BLOOD BLOOD RIGHT ARM  Final   Special Requests   Final    BOTTLES DRAWN AEROBIC AND ANAEROBIC Blood Culture adequate volume   Culture   Final    NO GROWTH 5 DAYS Performed at [facility], [address] [place],  [address]    Report Status 06/13/2022 FINAL  Final  Resp panel by RT-PCR (RSV, Flu A&B, Covid) Anterior Nasal Swab     Status: None   Collection Time: 06/08/22  7:18 PM   Specimen: Anterior Nasal Swab  Result Value Ref Range Status   SARS Coronavirus 2 by RT PCR NEGATIVE NEGATIVE Final    Comment: (NOTE) SARS-CoV-2 target nucleic acids are NOT DETECTED.  The SARS-CoV-2 RNA is generally detectable in upper respiratory specimens during the acute phase of infection. The lowest concentration of SARS-CoV-2 viral copies this assay can detect is 138 copies/mL. A negative result does not preclude SARS-Cov-2 infection and should not be used as the sole basis for treatment or other patient management decisions. A negative result may occur with  improper specimen collection/handling, submission of specimen other than nasopharyngeal swab, presence of viral mutation(s) within the areas targeted by this assay, and inadequate number of viral copies(<138 copies/mL). A negative result must be combined with clinical observations, patient history, and epidemiological information. The expected result is Negative.  Fact Sheet for Patients:  EntrepreneurPulse.com.au  Fact Sheet for Healthcare Providers:   IncredibleEmployment.be  This test is no t yet approved or cleared by the [place] FDA and  has been authorized for detection and/or diagnosis of SARS-CoV-2 by FDA under an Emergency Use Authorization (EUA). This EUA will remain  in effect (meaning this test can be used) for the duration of the COVID-19 declaration under Section 564(b)(1) of the Act, 21 U.S.C.section 360bbb-3(b)(1), unless the authorization is terminated  or revoked sooner.       Influenza A by PCR NEGATIVE NEGATIVE Final   Influenza B by PCR NEGATIVE NEGATIVE Final    Comment: (NOTE) The Xpert Xpress SARS-CoV-2/FLU/RSV plus assay is intended as an aid in the diagnosis of influenza from Nasopharyngeal swab specimens and should not be used as a sole basis for treatment. Nasal washings and aspirates are unacceptable for Xpert Xpress SARS-CoV-2/FLU/RSV testing.  Fact Sheet for Patients: EntrepreneurPulse.com.au  Fact Sheet  for Healthcare Providers: IncredibleEmployment.be  This test is not yet approved or cleared by the [place] and has been authorized for detection and/or diagnosis of SARS-CoV-2 by FDA under an Emergency Use Authorization (EUA). This EUA will remain in effect (meaning this test can be used) for the duration of the COVID-19 declaration under Section 564(b)(1) of the Act, 21 U.S.C. section 360bbb-3(b)(1), unless the authorization is terminated or revoked.     Resp Syncytial Virus by PCR NEGATIVE NEGATIVE Final    Comment: (NOTE) Fact Sheet for Patients: EntrepreneurPulse.com.au  Fact Sheet for Healthcare Providers: IncredibleEmployment.be  This test is not yet approved or cleared by the [place] FDA and has been authorized for detection and/or diagnosis of SARS-CoV-2 by FDA under an Emergency Use Authorization (EUA). This EUA will remain in effect (meaning this test can be used) for  the duration of the COVID-19 declaration under Section 564(b)(1) of the Act, 21 U.S.C. section 360bbb-3(b)(1), unless the authorization is terminated or revoked.  Performed at [facility], [address] [place], [place] [address]   Urine Culture     Status: None   Collection Time: 06/08/22  9:34 PM   Specimen: In/Out Cath Urine  Result Value Ref Range Status   Specimen Description   Final    IN/OUT CATH URINE Performed at [facility], [address] [place], [place] [address]    Special Requests   Final    NONE Performed at [facility], [address] [place], [place] [address]    Culture   Final    NO GROWTH Performed at [facility], [place] [address] [place], [place] [address]    Report Status 06/10/2022 FINAL  Final  MRSA Next Gen by PCR, Nasal     Status: None   Collection Time: 06/10/22 11:49 PM   Specimen: Nasal Mucosa; Nasal Swab  Result Value Ref Range Status   MRSA by PCR Next Gen NOT DETECTED NOT DETECTED Final    Comment: (NOTE) The GeneXpert MRSA Assay (FDA approved for NASAL specimens only), is one component of a comprehensive MRSA colonization surveillance program. It is not intended to diagnose MRSA infection nor to guide or monitor treatment for MRSA infections. Test performance is not FDA approved in patients less than 26 years old. Performed at [facility], [place], [place] [address]   Respiratory (~20 pathogens) panel by PCR     Status: None   Collection Time: 06/12/22  6:15 AM   Specimen: Nasopharyngeal Swab; Respiratory  Result Value Ref Range Status   Adenovirus NOT DETECTED NOT DETECTED Final   Coronavirus 229E NOT DETECTED NOT DETECTED Final    Comment: (NOTE) The Coronavirus on the Respiratory Panel, DOES NOT test for the novel  Coronavirus (2019 nCoV)    Coronavirus HKU1 NOT DETECTED NOT DETECTED Final   Coronavirus NL63 NOT DETECTED NOT DETECTED Final   Coronavirus OC43  NOT DETECTED NOT DETECTED Final   Metapneumovirus NOT DETECTED NOT DETECTED Final   Rhinovirus / Enterovirus NOT DETECTED NOT DETECTED Final   Influenza A NOT DETECTED NOT DETECTED Final   Influenza B NOT DETECTED NOT DETECTED Final   Parainfluenza Virus 1 NOT DETECTED NOT DETECTED Final   Parainfluenza Virus 2 NOT DETECTED NOT DETECTED Final   Parainfluenza Virus 3 NOT DETECTED NOT DETECTED Final   Parainfluenza Virus 4 NOT DETECTED NOT DETECTED Final   Respiratory Syncytial Virus NOT DETECTED NOT DETECTED Final   Bordetella pertussis NOT  DETECTED NOT DETECTED Final   Bordetella Parapertussis NOT DETECTED NOT DETECTED Final   Chlamydophila pneumoniae NOT DETECTED NOT DETECTED Final   Mycoplasma pneumoniae NOT DETECTED NOT DETECTED Final    Comment: Performed at [facility], [place] [address] [place], [place] [address]  Culture, Respiratory w Gram Stain     Status: None   Collection Time: 06/17/22  8:15 AM   Specimen: Tracheal Aspirate; Respiratory  Result Value Ref Range Status   Specimen Description   Final    TRACHEAL ASPIRATE Performed at [facility], [address] [place], [place] [address]    Special Requests   Final    NONE Performed at [facility], [address] [place], [place] [address]    Gram Stain   Final    RARE WBC PRESENT, PREDOMINANTLY PMN RARE SQUAMOUS EPITHELIAL CELLS PRESENT FEW YEAST Performed at [facility], [place] [address] [place], [place] [address]    Culture FEW CANDIDA ALBICANS  Final   Report Status 06/19/2022 FINAL  Final    Coagulation Studies: No results for input(s): "LABPROT", "INR" in the last 72 hours.  Urinalysis: No results for input(s): "COLORURINE", "LABSPEC", "PHURINE", "GLUCOSEU", "HGBUR", "BILIRUBINUR", "KETONESUR", "PROTEINUR", "UROBILINOGEN", "NITRITE", "LEUKOCYTESUR" in the last 72 hours.  Invalid input(s): "APPERANCEUR"    Imaging: US Venous Img Lower Bilateral (DVT)  Result Date:  06/21/2022 EXAM: BILATERAL LOWER EXTREMITY VENOUS DOPPLER ULTRASOUND TECHNIQUE: Gray-scale sonography with graded compression, as well as color Doppler and duplex ultrasound were performed to evaluate the lower extremity deep venous systems from the level of the common femoral vein and including the common femoral, femoral, profunda femoral, popliteal and calf veins including the posterior tibial, peroneal and gastrocnemius veins when visible. The superficial great saphenous vein was also interrogated. Spectral Doppler was utilized to evaluate flow at rest and with distal augmentation maneuvers in the common femoral, femoral and popliteal veins. COMPARISON:  None Available. FINDINGS: RIGHT LOWER EXTREMITY Common Femoral Vein: No evidence of thrombus. Normal compressibility, respiratory phasicity and response to augmentation. Saphenofemoral Junction: No evidence of thrombus. Normal compressibility and flow on color Doppler imaging. Profunda Femoral Vein: No evidence of thrombus. Normal compressibility and flow on color Doppler imaging. Femoral Vein: No evidence of thrombus. Normal compressibility, respiratory phasicity and response to augmentation. Popliteal Vein: No evidence of thrombus. Normal compressibility, respiratory phasicity and response to augmentation. Calf Veins: No evidence of thrombus. Normal flow on color Doppler imaging. LEFT LOWER EXTREMITY Common Femoral Vein: No evidence of thrombus. Normal compressibility, respiratory phasicity and response to augmentation. Saphenofemoral Junction: No evidence of thrombus. Normal compressibility and flow on color Doppler imaging. Profunda Femoral Vein: No evidence of thrombus. Normal compressibility and flow on color Doppler imaging. Femoral Vein: No evidence of thrombus. Normal compressibility, respiratory phasicity and response to augmentation. Popliteal Vein: No evidence of thrombus. Normal compressibility, respiratory phasicity and response to augmentation.  Calf Veins: No evidence of thrombus. Normal flow on color Doppler imaging. IMPRESSION: No evidence of deep venous thrombosis in either lower extremity. Electronically Signed   By: Albin Felling M.D.   On: 06/21/2022 13:23   DG Chest Port 1 View  Result Date: 06/21/2022 CLINICAL DATA:  Acute respiratory failure with hypoxia EXAM: PORTABLE CHEST 1 VIEW COMPARISON:  Yesterday FINDINGS: Left IJ line with tip at the SVC. Improved aeration at the bases but there is still right more than left airspace opacity with elevated right diaphragm. Stable cardiomegaly. No pneumothorax. IMPRESSION: Improved lower lobe infiltrates. Electronically Signed  By: Jorje Guild M.D.   On: 06/21/2022 07:45     Medications:    sodium chloride Stopped (06/17/22 2305)   dextrose 125 mL/hr at 06/22/22 0747   potassium chloride 10 mEq (06/22/22 0849)    budesonide (PULMICORT) nebulizer solution  0.5 mg Nebulization BID   Chlorhexidine Gluconate Cloth  6 each Topical Daily   fluocinonide-emollient   Topical QODAY   heparin  5,000 Units Subcutaneous Q8H   insulin aspart  0-20 Units Subcutaneous Q4H   ipratropium-albuterol  3 mL Nebulization Q6H   pantoprazole (PROTONIX) IV  40 mg Intravenous Q24H   docusate sodium, fentaNYL (SUBLIMAZE) injection, ipratropium-albuterol, lip balm, ondansetron **OR** ondansetron (ZOFRAN) IV, mouth rinse, phenol  Assessment/ Plan:  Samantha Mason is a 72 y.o.  female  with past medical history including COPD with renal failure requiring 2 L oxygen as needed, chronic diastolic heart failure, sleep apnea, morbid obesity, hypertension, and chronic kidney disease stage IV. Patient presents to the emergency department feeling sick and was admitted for Sepsis [facility]) [A41.9] Cellulitis, unspecified cellulitis site [L03.90] Community acquired pneumonia, unspecified laterality [J18.9] Sepsis, due to unspecified organism, unspecified whether acute organ dysfunction present [facility])  [A41.9]   Acute Kidney Injury on chronic kidney disease stage IIIb with baseline creatinine 1.55 and GFR of 36 on 04/17/22.  Acute kidney injury secondary to severe infection. No IV contrast exposure.  Diuretics remain held. Lower extremity edema present, however patient has history of lymphedema.   Creatinine elevated today, urine output adequate. RN reports large incontinent episode overnight.    Lab Results  Component Value Date   CREATININE 2.48 (H) 06/22/2022   CREATININE 1.83 (H) 06/21/2022   CREATININE 1.48 (H) 06/20/2022    Intake/Output Summary (Last 24 hours) at 06/22/2022 1010 Last data filed at 06/22/2022 0747 Gross per 24 hour  Intake 2754.68 ml  Output 150 ml  Net 2604.68 ml    2. Anemia of chronic kidney disease Lab Results  Component Value Date   HGB 8.3 (L) 06/22/2022    Hemoglobin stable today  3.  Chronic diastolic heart failure.  Echo from 07/16/21 shows EF 55 to 60% with mild LVH.  Diuretics remain held  4.  Acute respiratory failure.  High flow Declo, continue supportive care   LOS: 14 Corazon 2/3/202410:10 AM

## 2022-06-22 NOTE — Progress Notes (Addendum)
Samantha Mason, MRN:  [id number], DOB:  04/24/1951, LOS: 53 ADMISSION DATE:  06/08/2022, CHIEF COMPLAINT:  Shortness of breath   Brief Pt Description / Synopsis:  72 y.o. female with PMHx significant for HFpEF, COPD, OSA (noncompliant with CPAP), CKD Stage III who is  admitted with Sepsis in setting of lower extremity Cellulitis, AKI, and  Acute on Chronic Hypercapnic Respiratory Failure in setting AECOPD.  Course complicated by CO2 narcosis, failed BiPAP requiring intubation and mechanical ventilation.   History of Present Illness:  Samantha Mason is a 72 y.o. female with medical history significant for Morbid obesity, chronic HFpEF,CKD stage III, chronic lower extremity lymphedema on treatment with torsemide, hypertension, asthma, stroke (1995) and obstructive sleep apnea(non compliant with cpap) who presented to [facility] ED on 06/08/22 due to shortness of breath and generalized malaise for appropriately 1 week.   Pt is currently altered and no family currently available, therefore history is obtained from chart review.  Per ED and nursing notes, she reported about a 1 week history of feeling weak and fatigued, chills, bilateral lower extremity swelling and erythema (R>L), paroxsymal nocturnal dyspnea and orthopnea, weight gain with increased oxygen requirements.  She denied abdominal pain, N/V/D, dysuria.   ED Course: Initial Vital Signs: 105F orally, BP 129/53, HR 109, RR 27, SpO2 96% Significant Labs: BUN 39, Creatinine 1.59, Albumin 2.4, lactic acid 1.1, WBC 12.9, Hgb 8.0, Hct 27.7, Hgb A1c 5.8 COVID/FLU/RSV negative UA negative for UTI Imaging Chest X-ray>>IMPRESSION: Mild cardiomegaly with central pulmonary vascular congestion. CT Chest/Abdomen/Pelvis>> IMPRESSION: 1. Skin thickening and soft tissue edema of the partially imaged proximal right thigh. Correlate for cellulitis. 2. Newly enlarged bilateral inguinal and iliac lymph nodes, likely reactive. 3. Diffuse bilateral  bronchial wall thickening and mosaic attenuation of the airspaces, consistent with nonspecific infectious or inflammatory bronchitis. 4. Cardiomegaly.   She met Sepsis criteria, therefore she received IV fluids and antibiotics. Hospitalists were asked to admit for further workup and treatment os sepsis due to cellulitis.   Please see "significant hospital events" section below for full detailed hospital course.  Pertinent  Medical History         Anemia     Arthritis     Asthma     B12 deficiency 03/14/2017   Chest pain 07/02/2018   Chest pain      a. 06/2018 MV: EF 66%. No ischemia/infarct; b. 02/2021 PET CT: EF>60%, no ischemia/infarct.   Chronic heart failure with preserved ejection fraction (HFpEF) ([place])      a. 08/2020 Echo: EF 55-60%, GrI DD; b. 06/2021 Echo: EF 55-60%, mild LVH, nl RV fxn, no signif valvular dzs.   COPD (chronic obstructive pulmonary disease) ([place]) 07/24/2018   Family history of blood clots 07/16/2017   Irritable bowel syndrome     Malignant hypertension     Obesity     OSA (obstructive sleep apnea)      uses cpap   Pneumonia 2017   Restless leg syndrome     Stroke [facility]) 1995    mild    Significant Hospital Events: Including procedures, antibiotic start and stop dates in addition to other pertinent events   1/20: Presented to ED, admitted by [facility] 1/21: Added cefepime, continue vancomycin, increase dose of Cardizem for better heart rate control 1/22: Had to reduce Cardizem dose back to her home dose as blood pressure dropped 1/23: Transferred to ICU overnight due to CO2 narcosis, placed on BiPAP.  PCCM consulted.  Worsening AKI, holding diuresis.  Agitation requiring Precedex, high risk for Intubation.  Palliative Care consulted for [place] conversations.  ABX broadened back to Cefepime and Vancomycin 1/24: Pt on Bipap but awake and able to follow commands.  Will attempt to transition off Bipap.  Palliative Care following along to assist with goals of care  conversations  1/25: Pt currently on Bipap and precedex gtt.  Required 2.5 mg iv valium overnight for delirium.  Unable to obtain CT Head due to respiratory status.  Ultimately required intubation. 1/26: Remains critically ill. Issues with vent dyschrony and ventilation, plan to start Nimbex gtt. Creatinine improved with diuresis, continue with diuresis, Nephrology considering Lasix gtt. 1/27: ABG's improved on Nimbex, persistent cuff leak, exchanged ETT. Decadron started due to upper airway swelling. Will attempt to d/c Nimbex. Good diuresis with Lasix gtt with 4.5 L of UOP last 24 hrs, Creatinine improving. 1/28: Continues with good diuresis on Lasix gtt, 3L of UOP over preceding 24 hrs, creatinine continues to improve.  Vent requirements slowly improving. 1/29: No acute events overnight. Remains mechanically intubated with minimal vent settings: PEEP 5/FiO2 40%.  Remains on lasix gtt '@2mg'$ /hr.  Not on sedation and attempting to follow commands but extremely weak.  Will perform SBT as tolerated   1/30: No acute events overnight.  Pt awake and following commands on precedex gtt '@0'$ .2 mcg/kg/hr.  Lasix gtt '@4'$  mg/hr. Tolerating SBT 15/8 1/31: No acute events, awake and following commands on low dose precedex.  Remains on Lasix gtt @ '4mg'$ /hr,  Creatinine remains stable BUN slowly uptrending. Tolerating SBT 15/8 ~ will continue to wean PSV as tolerated ~ EXTUBATED. Lasix gtt d/c following extubation.  Following extubation, pt confirmed DNR/DNI status in presence of her son at bedside. 2/1: Tolerated BiPAP overnight, weaned to [facility] this am.  Speech evaluation performed, which pt FAILED. Consult PT/OT.  Creatinine continues to improve, started on D5W @ 50 ml/hr due to Hypernatremia of 155.  Holding diuresis today 2/2: Pt febrile currently on Bipap '@50'$ %.  Pt awake and following commands.  Failed speech evaluation on 02/1 and declining NGT placement.   2/3  Interim History / Subjective:  Patient is weak,  shortness of breath stable.  Objective   Blood pressure (!) 157/62, pulse 91, temperature 98 F (36.7 C), temperature source Oral, resp. rate (!) 24, height '5\' 4"'$  (1.626 m), weight (!) 141.4 kg, SpO2 97 %.    FiO2 (%):  [33 %-50 %] 35 %   Intake/Output Summary (Last 24 hours) at 06/22/2022 0750 Last data filed at 06/22/2022 0747 Gross per 24 hour  Intake 2904.56 ml  Output 175 ml  Net 2729.56 ml   Filed Weights   06/20/22 0500 06/21/22 0500 06/22/22 0514  Weight: (!) 144.2 kg (!) 140 kg (!) 141.4 kg    Examination:  General: no acute distress, resting in bed, chronically ill, obese HEENT: [place]/AT, moist mucous membranes, sclera anicteric Neuro: A&O x 3 CV: rrr, s1s2, no murmurs PULM: clear to auscultation on anterior auscultation GI: soft, non-tender, non-distended, BS+ Extremities: warm, bilateral lower extremity edema Skin: no rashes    Assessment & Plan:   72 year old chronically ill female with multiple medical problems who presents to the hospital with acute on chronic hypoxic and hypercapnic respiratory failure in the setting of decompensated heart failure.  Neurology #Toxic Metabolic Encephalopathy  Mental status improved with improvement in her CO2 levels and resolution of CO2 narcosis and metabolic derangements. EEG negative for seizures, CT head normal. Continue supportive care.  Cardiovascular #Acute  on Chronic HFpEF  TTE with diastolic dysfunction and signs of restriction, and she has been diuresed with good effect, and is now contracted. Holding diuresis given contraction, hypernatremia, and renal failure.  Pulmonary #Acute on Chronic Hypoxic and Hypercapnic Respiratory Failure  Hypercapnic respiratory failure secondary to a combination of HFpEF and OHS. Initially intubated and extubated on 06/19/2022. Her respiratory failure was treated with BiPAP, diuresis, and bronchodilators. Her latest gas is showing metabolic alkalosis (combination of contraction alkalosis  and compensation) and respiratory acidosis. She is refusing BiPAP and intermittently using Hi-Flo. She is very high risk for aspiration.  -continue Hi-Flo nasal cannula -continue supportive care with bronchodilators -palliative care involved  Gastrointestinal  Patient is high risk for aspiration and is refusing NG tube placement. High risk from SLP perspective. Will discuss with patient and family, she is malnourished and this is contributing to worsening weakness.  Renal #Acute on Chronic Kidney Injury #Hypernatremia #Contraction Alkalosis  Holding diuresis with alkalosis, worsening kidney injury and severe hypernatremia. Patient on D5W. She would consider dialysis if offered to her.  -resume D5W at 50 mL/hour -renal consult highly appreciated  Endocrine Glycemic control with sliding scale  Hem/Onc #Anemia of Chronic Disease  Trend CBC  ID  Patient had been febrile and received a course of antibiotics with Cefepime, completed on 2/1. Medication list reviewed, continue to monitor.   Best Practice (right click and "Reselect all SmartList Selections" daily)   Diet/type: NPO DVT prophylaxis: prophylactic heparin  GI prophylaxis: PPI Lines: Central line Foley:  N/A Code Status:  DNR Last date of multidisciplinary goals of care discussion [06/22/2022]  Labs   CBC: Recent Labs  Lab 06/18/22 0423 06/19/22 0343 06/20/22 0444 06/21/22 0500 06/22/22 0455  WBC 10.9* 11.3* 12.5* 12.2* 11.8*  NEUTROABS  --   --   --   --  8.8*  HGB 8.6* 8.4* 8.4* 8.6* 8.3*  HCT 29.0* 29.0* 30.1* 30.6* 29.6*  MCV 81.7 83.1 86.5 86.7 85.8  PLT 202 191 191 219 878    Basic Metabolic Panel: Recent Labs  Lab 06/18/22 0423 06/19/22 0343 06/20/22 0444 06/21/22 0500 06/21/22 0530 06/22/22 0455  NA 146* 149* 155*  --  158* 151*  K 3.6 3.9 3.5  --  4.0 3.3*  CL 101 104 106  --  112* 108  CO2 35* 37* 39*  --  37* 33*  GLUCOSE 130* 138* 129*  --  111* 129*  BUN 116* 123* 109*  --  87*  94*  CREATININE 1.52* 1.54* 1.48*  --  1.83* 2.48*  CALCIUM 8.9 9.2 9.3  --  9.1 9.1  MG 2.2 2.0 2.2 2.3  --  2.3  PHOS 2.6 3.3 4.4  --  3.9 3.8   GFR: Estimated Creatinine Clearance: 29.8 mL/min (A) (by C-G formula based on SCr of 2.48 mg/dL (H)). Recent Labs  Lab 06/16/22 0400 06/17/22 0406 06/18/22 0423 06/19/22 0343 06/20/22 0444 06/21/22 0500 06/22/22 0455  PROCALCITON 0.27 0.20  --   --   --  0.58 0.58  WBC 25.1* 11.7*   < > 11.3* 12.5* 12.2* 11.8*   < > = values in this interval not displayed.    Liver Function Tests: Recent Labs  Lab 06/17/22 0406 06/18/22 0423 06/19/22 0343 06/20/22 0444 06/21/22 0530  ALBUMIN 2.5* 2.6* 2.5* 2.7* 2.6*   No results for input(s): "LIPASE", "AMYLASE" in the last 168 hours. No results for input(s): "AMMONIA" in the last 168 hours.  ABG    Component Value  Date/Time   PHART 7.43 06/17/2022 1026   PCO2ART 18 (LL) 06/17/2022 1026   PO2ART 149 (H) 06/17/2022 1026   HCO3 38.6 (H) 06/22/2022 0500   ACIDBASEDEF 9.8 (H) 06/17/2022 1026   O2SAT 78.2 06/22/2022 0500     Coagulation Profile: No results for input(s): "INR", "PROTIME" in the last 168 hours.  Cardiac Enzymes: No results for input(s): "CKTOTAL", "CKMB", "CKMBINDEX", "TROPONINI" in the last 168 hours.  HbA1C: Hgb A1c MFr Bld  Date/Time Value Ref Range Status  06/08/2022 07:16 AM 5.8 (H) 4.8 - 5.6 % Final    Comment:    (NOTE) Pre diabetes:          5.7%-6.4%  Diabetes:              >6.4%  Glycemic control for   <7.0% adults with diabetes   07/15/2021 04:30 PM 5.4 4.8 - 5.6 % Final    Comment:    (NOTE) Pre diabetes:          5.7%-6.4%  Diabetes:              >6.4%  Glycemic control for   <7.0% adults with diabetes     CBG: Recent Labs  Lab 06/21/22 1608 06/21/22 1938 06/21/22 2346 06/22/22 0331 06/22/22 0744  GLUCAP 113* 110* 118* 129* 113*    Past Medical History:  She,  has a past medical history of Acute bronchitis, Anemia, Arthritis,  Asthma, B12 deficiency (03/14/2017), Chest pain (07/02/2018), Chest pain, Chronic heart failure with preserved ejection fraction (HFpEF) ([place]), COPD (chronic obstructive pulmonary disease) ([place]) (07/24/2018), Family history of blood clots (07/16/2017), Irritable bowel syndrome, Malignant hypertension, Obesity, OSA (obstructive sleep apnea), Pneumonia (2017), Restless leg syndrome, and Stroke ([place]) (1995).   Surgical History:   Past Surgical History:  Procedure Laterality Date   CHOLECYSTECTOMY     COLONOSCOPY WITH PROPOFOL N/A 03/31/2018   Procedure: COLONOSCOPY WITH PROPOFOL;  Surgeon: Lucilla Lame, MD;  Location: [facility] ENDOSCOPY;  Service: Endoscopy;  Laterality: N/A;   KNEE ARTHROPLASTY Right 04/30/2019   Procedure: COMPUTER ASSISTED TOTAL KNEE ARTHROPLASTY;  Surgeon: Dereck Leep, MD;  Location: [facility] ORS;  Service: Orthopedics;  Laterality: Right;   RIGHT HEART CATH N/A 01/30/2022   Procedure: RIGHT HEART CATH;  Surgeon: Nelva Bush, MD;  Location: [facility] LAB;  Service: Cardiovascular;  Laterality: N/A;   saliva gland removal  1998   TOTAL KNEE ARTHROPLASTY Left 2014   TUBAL LIGATION Bilateral      Social History:   reports that she has never smoked. She has never used smokeless tobacco. She reports that she does not drink alcohol and does not use drugs.   Family History:  Her family history includes COPD in her father; Coronary artery disease (age of onset: 53) in her brother; Dementia in her mother; Diabetes in her father; Heart failure in her brother and maternal grandmother; Stroke in her maternal grandmother. There is no history of Breast cancer.   Allergies Allergies  Allergen Reactions   Nuvigil [Armodafinil] Hives   Penicillins Diarrhea and Nausea And Vomiting    Did it involve swelling of the face/tongue/throat, SOB, or low BP? no Did it involve sudden or severe rash/hives, skin peeling, or any reaction on the inside of your mouth or nose? No Did you need  to seek medical attention at a hospital or doctor's office? No When did it last happen?  in her 89s    If all above answers are "NO", may proceed with cephalosporin use.  Spironolactone Itching   Gabapentin Other (See Comments)    "wiped her out, couldn't stay awake"   Melatonin Other (See Comments)    "Brain fog"   Provigil [Modafinil] Hives   Entresto [Sacubitril-Valsartan] Itching   Lisinopril Cough     Home Medications  Prior to Admission medications   Medication Sig Start Date End Date Taking? Authorizing Provider  acetaminophen (TYLENOL) 500 MG tablet Take 1,000 mg by mouth every 6 (six) hours as needed.   Yes [provider]  benzonatate (TESSALON) 100 MG capsule TAKE 1 CAPSULE BY MOUTH THREE TIMES A DAY AS NEEDED FOR COUGH 03/08/22  Yes Tyler Pita, MD  calcitRIOL (ROCALTROL) 0.25 MCG capsule Take 0.25 mcg by mouth daily. 02/22/22  Yes [provider]  dapagliflozin propanediol (FARXIGA) 10 MG TABS tablet Take 1 tablet (10 mg total) by mouth daily before breakfast. 03/07/22  Yes Hackney, Tina A, FNP  diltiazem (CARDIZEM CD) 240 MG 24 hr capsule Take 1 capsule (240 mg total) by mouth daily. 09/05/21  Yes Theora Gianotti, NP  ipratropium-albuterol (DUONEB) 0.5-2.5 (3) MG/3ML SOLN TAKE 3 MLS BY NEBULIZATION EVERY 6 (SIX) HOURS AS NEEDED. J45.40 03/04/22  Yes Tyler Pita, MD  metolazone (ZAROXOLYN) 2.5 MG tablet Take 1 tablet (2.5 mg total) by mouth 2 (two) times a week. Patient taking differently: Take 2.5 mg by mouth 2 (two) times a week. Monday and Friday 02/04/22  Yes Wouk, Ailene Rud, MD  ondansetron (ZOFRAN) 4 MG tablet Take 1 tablet (4 mg total) by mouth daily as needed for nausea or vomiting. 05/10/22 05/10/23 Yes Lorella Nimrod, MD  pantoprazole (PROTONIX) 40 MG tablet TAKE 1 TABLET BY MOUTH EVERY DAY 12/11/21  Yes Glean Hess, MD  PREVALITE 4 g packet DISSOLVE AND DRINK 1 PACKET BY MOUTH AT BEDTIME 03/05/22  Yes Glean Hess, MD  rOPINIRole (REQUIP) 1 MG tablet Take 1 tablet (1 mg total) by mouth 3 (three) times daily. 02/13/22  Yes Glean Hess, MD  Torsemide 40 MG TABS Take 40 mg by mouth daily.   Yes [provider]  budesonide (PULMICORT) 0.5 MG/2ML nebulizer solution TAKE 2 ML (0.5 MG TOTAL) BY NEBULIZATION TWICE A DAY Patient not taking: Reported on 06/08/2022 10/29/21   Tyler Pita, MD  feeding supplement (ENSURE ENLIVE / ENSURE PLUS) LIQD Take 237 mLs by mouth 3 (three) times daily between meals. 05/10/22   Lorella Nimrod, MD  metoCLOPramide (REGLAN) 5 MG tablet Take 1 tablet (5 mg total) by mouth 3 (three) times daily before meals. Patient not taking: Reported on 06/08/2022 05/12/22   Sharen Hones, MD  NON FORMULARY Bipap - nightly with Oxygen 2 Lpm    [provider]  oxyCODONE 7.5 MG TABS Take 7.5 mg by mouth every 6 (six) hours as needed. 05/10/22 05/10/23  Lorella Nimrod, MD  OXYGEN Inhale into the lungs. 2 LPM [place]    [provider]  promethazine (PHENERGAN) 25 MG tablet Take 1 tablet (25 mg total) by mouth every 6 (six) hours as needed. Patient not taking: Reported on 06/08/2022 05/10/22 05/10/23  Lorella Nimrod, MD     Critical care time: 31 minutes

## 2022-06-22 NOTE — Progress Notes (Signed)
To room to respond to bipap alarm, patient was attempting to remove bipap, removed bipap per patient's request. Notified respiratory Latreice that I removed bipap and placed patient back on heated high flow at 40 lpm and 50%. Will continue to monitor.

## 2022-06-22 NOTE — Progress Notes (Signed)
SLP Cancellation Note  Patient Details Name: Samantha Mason MRN: [id number] DOB: [birth year]   Cancelled treatment:       Reason Eval/Treat Not Completed:  (chart reviewed; consulted MD and NSG re: pt's status today. Noted Palliative Care's note from yesterday.)  Per discussion w/ MD, pt and family are having ongoing discussion re: GOC and further txs/assessments. Palliative Care met w/ pt again for new GOC discussion after pt indicated a more Comfort approach to NP yesterday PM(per chart notes and report).  This morning, NSG reported intermittent coughing w/ the ice chips that have been agreed upon by Team for pt to practice swallowing and for her comfort(see BSE).   MD stated to HOLD on ST assessment at this time. MD reported medical decline ongoing, and he plans to speak w/ pt/Family this weekend re: pt's further [place]. ST services will f/u on Monday for further needs; MD agreed w/ continuing ice chips currently. NSG aware of frequent oral care/prior to ice chips as well as aspiration precautions.     Orinda Kenner, MS, CCC-SLP Speech Language Pathologist Rehab Services; [place] [phone] (ascom) Azara Gemme 06/22/2022, 12:28 PM

## 2022-06-22 NOTE — Consult Note (Signed)
PHARMACY CONSULT NOTE  Pharmacy Consult for Electrolyte Monitoring and Replacement   Recent Labs: Potassium (mmol/L)  Date Value  06/22/2022 3.3 (L)  05/25/2014 3.6   Magnesium (mg/dL)  Date Value  06/22/2022 2.3   Calcium (mg/dL)  Date Value  06/22/2022 9.1   Calcium, Total (mg/dL)  Date Value  05/25/2014 8.6   Albumin (g/dL)  Date Value  06/21/2022 2.6 (L)  05/25/2014 3.2 (L)   Phosphorus (mg/dL)  Date Value  06/22/2022 3.8   Sodium (mmol/L)  Date Value  06/22/2022 151 (H)  02/13/2022 135  05/25/2014 141    Assessment: 72 y.o. female with PMHx significant for HFpEF, COPD, OSA (noncompliant with CPAP), CKD Stage III who is  admitted with Sepsis in setting of lower extremity Cellulitis, AKI, and  Acute on Chronic Hypercapnic Respiratory Failure in setting AECOPD. Pharmacy has been consulted to monitor and replace electrolytes while PCCM care.  Diuretics: stopped  Nutrition: Vital at 60 mL/hr + FWF 30 mL q4h  Goal of Therapy:  Electrolytes WNL  Plan:  --KCL 70mq x 2 doses --Na 158>151. D5W @ 50 mL/hr as ordered by medical team. Continue to monitor.  --Will recheck electrolytes with AM labs   WPearla Dubonnet PharmD Clinical Pharmacist 06/22/2022 8:28 AM

## 2022-06-22 NOTE — IPAL (Signed)
  Interdisciplinary Goals of Care Family Meeting   Date carried out: 06/22/2022  Location of the meeting: Bedside  Member's involved: Physician and Family Member or next of kin  Durable Power of Attorney or acting medical decision maker: Conversation with the patient in the presence of her son.    Discussion: We discussed goals of care for [employer] .  Discussed with patient her overall condition, with renal failure, heart failure, and respiratory failure. Discussed that she is high risk for aspiration and that could result in her breathing worsening resulting in her demise. Patient does not want to be intubated or resuscitated. She would be ok with dialysis. After discussion, she will consider a small bore NG tube and make a decision soon.  Code status:   Code Status: Prior   Disposition: Continue current acute care  Time spent for the meeting: 80 minutes    Armando Reichert, MD  06/22/2022, 4:58 PM

## 2022-06-23 DIAGNOSIS — I5032 Chronic diastolic (congestive) heart failure: Secondary | ICD-10-CM | POA: Diagnosis not present

## 2022-06-23 DIAGNOSIS — A419 Sepsis, unspecified organism: Secondary | ICD-10-CM | POA: Diagnosis not present

## 2022-06-23 DIAGNOSIS — J9621 Acute and chronic respiratory failure with hypoxia: Secondary | ICD-10-CM | POA: Diagnosis not present

## 2022-06-23 DIAGNOSIS — G9341 Metabolic encephalopathy: Secondary | ICD-10-CM | POA: Diagnosis not present

## 2022-06-23 DIAGNOSIS — I10 Essential (primary) hypertension: Secondary | ICD-10-CM

## 2022-06-23 LAB — GLUCOSE, CAPILLARY
Glucose-Capillary: 98 mg/dL (ref 70–99)
Glucose-Capillary: 109 mg/dL — ABNORMAL HIGH (ref 70–99)
Glucose-Capillary: 100 mg/dL — ABNORMAL HIGH (ref 70–99)
Glucose-Capillary: 96 mg/dL (ref 70–99)
Glucose-Capillary: 96 mg/dL (ref 70–99)
Glucose-Capillary: 162 mg/dL — ABNORMAL HIGH (ref 70–99)

## 2022-06-23 LAB — CBC WITH DIFFERENTIAL/PLATELET
Abs Immature Granulocytes: 0.04 10*3/uL (ref 0.00–0.07)
Basophils Absolute: 0 10*3/uL (ref 0.0–0.1)
Basophils Relative: 0 %
Eosinophils Absolute: 0.4 10*3/uL (ref 0.0–0.5)
Eosinophils Relative: 4 %
HCT: 29.9 % — ABNORMAL LOW (ref 36.0–46.0)
Hemoglobin: 8.5 g/dL — ABNORMAL LOW (ref 12.0–15.0)
Immature Granulocytes: 0 %
Lymphocytes Relative: 12 %
Lymphs Abs: 1.3 10*3/uL (ref 0.7–4.0)
MCH: 24.1 pg — ABNORMAL LOW (ref 26.0–34.0)
MCHC: 28.4 g/dL — ABNORMAL LOW (ref 30.0–36.0)
MCV: 84.9 fL (ref 80.0–100.0)
Monocytes Absolute: 0.8 10*3/uL (ref 0.1–1.0)
Monocytes Relative: 7 %
Neutro Abs: 7.9 10*3/uL — ABNORMAL HIGH (ref 1.7–7.7)
Neutrophils Relative %: 77 %
Platelets: 237 10*3/uL (ref 150–400)
RBC: 3.52 MIL/uL — ABNORMAL LOW (ref 3.87–5.11)
RDW: 19.4 % — ABNORMAL HIGH (ref 11.5–15.5)
WBC: 10.4 10*3/uL (ref 4.0–10.5)
nRBC: 0 % (ref 0.0–0.2)

## 2022-06-23 LAB — BASIC METABOLIC PANEL
Anion gap: 12 (ref 5–15)
BUN: 88 mg/dL — ABNORMAL HIGH (ref 8–23)
CO2: 30 mmol/L (ref 22–32)
Calcium: 9 mg/dL (ref 8.9–10.3)
Chloride: 108 mmol/L (ref 98–111)
Creatinine, Ser: 2.58 mg/dL — ABNORMAL HIGH (ref 0.44–1.00)
GFR, Estimated: 19 mL/min — ABNORMAL LOW (ref >60)
Glucose, Bld: 110 mg/dL — ABNORMAL HIGH (ref 70–99)
Potassium: 3.6 mmol/L (ref 3.5–5.1)
Sodium: 150 mmol/L — ABNORMAL HIGH (ref 135–145)

## 2022-06-23 LAB — MAGNESIUM: Magnesium: 2.2 mg/dL (ref 1.7–2.4)

## 2022-06-23 LAB — PHOSPHORUS: Phosphorus: 4.1 mg/dL (ref 2.5–4.6)

## 2022-06-23 MED ORDER — ORAL CARE MOUTH RINSE: 15.0000 mL | OROMUCOSAL | Status: DC | PRN

## 2022-06-23 NOTE — Progress Notes (Signed)
Physical Therapy Treatment Patient Details Name: Samantha Mason MRN: [id number] DOB: 26-May-1950 Today's Date: 06/23/2022   History of Present Illness 72 y/o female with h/o morbid obesity, OSA, HTN, COPD, CHF, CKD IV, GERD, IBS, RLS and recent admission for SBO (medically managed) who is admitted with CHF, sepsis, UTI, AKI and cellulitis. Pt extubated on 1/31.    PT Comments    Pt is making good progress towards goals with only requiring 2L of O2 at this time with O2 sats 96-100% with exertion. Pt able to perform supine there-ex and progressed to sitting at EOB this date. Would need BRW and additional staff for standing attempt. Encouraged pt to continue sitting attempts with RN staff to build up tolerance for chair transfer. Pt eager to participate, SNF remains PT recommendation. WIll continue to progress.   Recommendations for follow up therapy are one component of a multi-disciplinary discharge planning process, led by the attending physician.  Recommendations may be updated based on patient status, additional functional criteria and insurance authorization.  Follow Up Recommendations  Skilled nursing-short term rehab (<3 hours/day) Can patient physically be transported by private vehicle: No   Assistance Recommended at Discharge Frequent or constant Supervision/Assistance  Patient can return home with the following Two people to help with walking and/or transfers;Two people to help with bathing/dressing/bathroom   Equipment Recommendations   (TBD)    Recommendations for Other Services       Precautions / Restrictions Precautions Precautions: Fall Restrictions Weight Bearing Restrictions: No     Mobility  Bed Mobility Overal bed mobility: Needs Assistance Bed Mobility: Supine to Sit     Supine to sit: Mod assist     General bed mobility comments: able to initiate mobility with reaching across body with UE. Needs assist for B LE. ONce seated at EOB, pt able to prop  herself up in bed and maintain for several minutes. O2 remained stable at 97%    Transfers                   General transfer comment: not safe to attempt secondary to equipment/staffing    Ambulation/Gait                   Stairs             Wheelchair Mobility    Modified Rankin (Stroke Patients Only)       Balance Overall balance assessment: Needs assistance Sitting-balance support: Feet unsupported, Bilateral upper extremity supported Sitting balance-Leahy Scale: Fair                                      Cognition Arousal/Alertness: Awake/alert Behavior During Therapy: WFL for tasks assessed/performed Overall Cognitive Status: Within Functional Limits for tasks assessed                                 General Comments: intermittent confusion to situation        Exercises Other Exercises Other Exercises: supine/seated ther-ex performed on B LE including AP, QS, glut sets, SLRs, and LAQ. 10 reps on B LE. Encouraged to continue progression throughout day. Also performed 10 reps of IS at 275m    General Comments        Pertinent Vitals/Pain Pain Assessment Pain Assessment: Faces Faces Pain Scale: Hurts little more Pain Location: L knee  Pain Descriptors / Indicators: Discomfort, Grimacing, Guarding Pain Intervention(s): Limited activity within patient's tolerance, Repositioned    Home Living                          Prior Function            PT Goals (current goals can now be found in the care plan section) Acute Rehab PT Goals Patient Stated Goal: get her rectal tube out PT Goal Formulation: With patient Time For Goal Achievement: 07/04/22 Progress towards PT goals: Progressing toward goals    Frequency    Min 2X/week      PT Plan Current plan remains appropriate    Co-evaluation              AM-PAC PT "6 Clicks" Mobility   Outcome Measure  Help needed turning from  your back to your side while in a flat bed without using bedrails?: A Lot Help needed moving from lying on your back to sitting on the side of a flat bed without using bedrails?: A Lot Help needed moving to and from a bed to a chair (including a wheelchair)?: A Lot Help needed standing up from a chair using your arms (e.g., wheelchair or bedside chair)?: A Lot Help needed to walk in hospital room?: Total Help needed climbing 3-5 steps with a railing? : Total 6 Click Score: 10    End of Session Equipment Utilized During Treatment: Oxygen Activity Tolerance: Patient tolerated treatment well Patient left: in bed;with bed alarm set Nurse Communication: Mobility status PT Visit Diagnosis: Muscle weakness (generalized) (M62.81);Other abnormalities of gait and mobility (R26.89);Pain Pain - Right/Left: Left Pain - part of body: Leg     Time: 7681-1572 PT Time Calculation (min) (ACUTE ONLY): 30 min  Charges:  $Therapeutic Exercise: 8-22 mins $Therapeutic Activity: 8-22 mins                     Greggory Stallion, PT, DPT, GCS [phone]    Samantha Mason 06/23/2022, 10:10 AM

## 2022-06-23 NOTE — Progress Notes (Signed)
Speech Language Pathology Treatment:    Patient Details Name: Samantha Mason MRN: [id number] DOB: 09/06/1950 Today's Date: 06/23/2022 Time: 4235-3614 SLP Time Calculation (min) (ACUTE ONLY): 20 min  Assessment / Plan / Recommendation Clinical Impression  Pt seen for clinical swallowing evaluation. Pt re-evaluated at request of NP Hinton Dyer). Pt alert and cooperative. Marked improvement in pt's vocal quality. Pt eager for POs. Pt on 2L/min O2 via [place].  Pt observed with trials of ice chips, thin liquids, nectar-thick liquids, pureed, and solid. Pt presents with s/sx mild-moderate oral dysphagia c/b prolonged/inefficient mastication and mild oral residual with solids. Pt endorsed lingual soreness with mastication. Sore spots noted on lingual body. Pt with s/sx concerning for pharyngeal dysphagia including immediate and delayed throat clearing with cup sips of thin liquids. No other overt s/sx noted across trials of other consistencies.   Pt remains at increased risk for aspiration/aspiration PNA given recent/prolonged intubation, multiple medical comorbidities, and clinical presentation.   Recommend initiation of a pureed diet with nectar-thick liquids with safe swallowing strategies/aspiration precautions as outlined below.   SLP to f/u per POC for diet tolerance and trials of upgraded textures.   Pt, NP, and RN made aware of results, recommendations and SLP POC. Pt verbalized understanding/agreement.    HPI HPI: Per H&P, "pt is a 72 y.o. female with medical history significant for Morbid obesity, chronic HFpEF,CKD stage III, chronic lower extremity lymphedema on treatment with torsemide, hypertension, asthma, stroke (1995) and obstructive sleep apnea(non compliant with cpap).  She presented to the emergency room accompanied by son with complaints of feeling unwell for the past 1 week.  She has been having chills but denied any fever.  She has also generally felt weak and fatigued.  She reports  bilateral lower extremity swelling with associated redness.  Redness is more on the right eye compared to the left.  She had reported same to cardiology during the week and was advised to increase dose of Demadex to 60 mg.  She felt discomfort in her lower legs.  She also reports increasing weight gain over the same period.  She is having to use oxygen at all times during the day and also at night.  She admits to PND and orthopnea.  EMS was called to her home and was reported to have fever of 105F. In the ED, vitals were noted to be stable with some evidence of tachycardia. Initial temperature reported to be 105.  This is trended down to 98.6 at the time of my evaluation. Patient was initiated on sepsis protocol with IV fluids and antibiotics. Workup in the ED including CT abdomen and pelvis were done. Significant findings include an skin thickening and soft tissue edema of the proximal right thigh.  No obvious abscess when noted.  Large bilateral inguinal and iliac lymph nodes were noted.  Diffuse bronchial wall thickening was also noted." Noted pt intubated on 06/11/22 and extubated 06/19/22.      SLP Plan  Continue with current plan of care      Recommendations for follow up therapy are one component of a multi-disciplinary discharge planning process, led by the attending physician.  Recommendations may be updated based on patient status, additional functional criteria and insurance authorization.    Recommendations  Diet recommendations: Dysphagia 1 (puree);Nectar-thick liquid Liquids provided via: No straw Medication Administration: Crushed with puree Supervision: Staff to assist with self feeding;Full supervision/cueing for compensatory strategies;Patient able to self feed Compensations: Minimize environmental distractions;Slow rate;Small sips/bites Postural Changes and/or Swallow Maneuvers:  Seated upright 90 degrees;Upright 30-60 min after meal                Oral Care Recommendations:  Oral care QID;Staff/trained caregiver to provide oral care Follow Up Recommendations: Follow physician's recommendations for discharge plan and follow up therapies Assistance recommended at discharge: Frequent or constant Supervision/Assistance SLP Visit Diagnosis: Dysphagia, pharyngeal phase (R13.13) Plan: Continue with current plan of care          Cherrie Gauze, M.S., [facility] [phone] [address])  Quintella Baton  06/23/2022, 11:40 AM

## 2022-06-23 NOTE — Consult Note (Addendum)
PHARMACY CONSULT NOTE  Pharmacy Consult for Electrolyte Monitoring and Replacement   Recent Labs: Potassium (mmol/L)  Date Value  06/23/2022 3.6  05/25/2014 3.6   Magnesium (mg/dL)  Date Value  06/23/2022 2.2   Calcium (mg/dL)  Date Value  06/23/2022 9.0   Calcium, Total (mg/dL)  Date Value  05/25/2014 8.6   Albumin (g/dL)  Date Value  06/21/2022 2.6 (L)  05/25/2014 3.2 (L)   Phosphorus (mg/dL)  Date Value  06/23/2022 4.1   Sodium (mmol/L)  Date Value  06/23/2022 150 (H)  02/13/2022 135  05/25/2014 141    Assessment: 72 y.o. female with PMHx significant for HFpEF, COPD, OSA (noncompliant with CPAP), CKD Stage III who is  admitted with Sepsis in setting of lower extremity Cellulitis, AKI, and  Acute on Chronic Hypercapnic Respiratory Failure in setting AECOPD. Pharmacy has been consulted to monitor and replace electrolytes while PCCM care.  Diuretics: stopped  Nutrition: Vital at 60 mL/hr + FWF 30 mL q4h  Goal of Therapy:  Electrolytes WNL  Plan:  --Na 158>150. D5W @ 50 mL/hr as ordered by medical team. Continue to monitor.  --Will recheck electrolytes with AM labs   Pearla Dubonnet, PharmD Clinical Pharmacist 06/23/2022 7:39 AM

## 2022-06-23 NOTE — Progress Notes (Signed)
[facility]  ROUNDING NOTE   Subjective:   Samantha Mason  is a 72 year old female with past medical history including COPD with renal failure requiring 2 L oxygen as needed, chronic diastolic heart failure, sleep apnea, morbid obesity, hypertension, and chronic kidney disease stage IV. Patient presents to the emergency department feeling sick for a few days.  She was admitted for Sepsis [facility]) [A41.9] Cellulitis, unspecified cellulitis site [L03.90] Community acquired pneumonia, unspecified laterality [J18.9] Sepsis, due to unspecified organism, unspecified whether acute organ dysfunction present [facility]) [A41.9]  Patient is followed by our office outpatient. She was last seen in office on Apr 17, 2022.   Patient remains in ICU, on HFNC Appears improved, voice stronger Lower extremity edema greatly improved since admission Remains NPO, agreeable to small bore feeding tube.  02/03 0701 - 02/04 0700 In: 1336.8 [P.O.:480; I.V.:656.8; IV Piggyback:200] Out: 1100 [Urine:1100] Lab Results  Component Value Date   CREATININE 2.58 (H) 06/23/2022   CREATININE 2.48 (H) 06/22/2022   CREATININE 1.83 (H) 06/21/2022      Objective:  Vital signs in last 24 hours:  Temp:  [97.7 F (36.5 C)-98.5 F (36.9 C)] 98 F (36.7 C) (02/04 0722) Pulse Rate:  [83-108] 96 (02/04 0600) Resp:  [16-27] 27 (02/04 0600) BP: (110-149)/(47-93) 120/93 (02/04 0600) SpO2:  [94 %-100 %] 98 % (02/04 0600) FiO2 (%):  [33 %] 33 % (02/04 0719) Weight:  [143.7 kg] 143.7 kg (02/04 0600)  Weight change: 2.3 kg Filed Weights   06/21/22 0500 06/22/22 0514 06/23/22 0600  Weight: (!) 140 kg (!) 141.4 kg (!) 143.7 kg    Intake/Output: I/O last 3 completed shifts: In: 3067.7 [P.O.:480; I.V.:2387.7; IV Piggyback:200] Out: 1100 [Urine:1100]   Intake/Output this shift:  Total I/O In: 698.1 [I.V.:698.1] Out: 0   Physical Exam: General: NAD  Head: Normocephalic, atraumatic.    Eyes: Anicteric  Lungs:   coarse breath sounds bilateral, HFNC  Heart: Regular rate and rhythm  Abdomen:  Soft, nontender  Extremities: 1+ peripheral edema.  Neurologic: Alert and oriented  Skin: No lesions  Access: No hemodialysis access    Basic Metabolic Panel: Recent Labs  Lab 06/19/22 0343 06/20/22 0444 06/21/22 0500 06/21/22 0530 06/22/22 0455 06/23/22 0408  NA 149* 155*  --  158* 151* 150*  K 3.9 3.5  --  4.0 3.3* 3.6  CL 104 106  --  112* 108 108  CO2 37* 39*  --  37* 33* 30  GLUCOSE 138* 129*  --  111* 129* 110*  BUN 123* 109*  --  87* 94* 88*  CREATININE 1.54* 1.48*  --  1.83* 2.48* 2.58*  CALCIUM 9.2 9.3  --  9.1 9.1 9.0  MG 2.0 2.2 2.3  --  2.3 2.2  PHOS 3.3 4.4  --  3.9 3.8 4.1     Liver Function Tests: Recent Labs  Lab 06/17/22 0406 06/18/22 0423 06/19/22 0343 06/20/22 0444 06/21/22 0530  ALBUMIN 2.5* 2.6* 2.5* 2.7* 2.6*    No results for input(s): "LIPASE", "AMYLASE" in the last 168 hours. No results for input(s): "AMMONIA" in the last 168 hours.   CBC: Recent Labs  Lab 06/19/22 0343 06/20/22 0444 06/21/22 0500 06/22/22 0455 06/23/22 0408  WBC 11.3* 12.5* 12.2* 11.8* 10.4  NEUTROABS  --   --   --  8.8* 7.9*  HGB 8.4* 8.4* 8.6* 8.3* 8.5*  HCT 29.0* 30.1* 30.6* 29.6* 29.9*  MCV 83.1 86.5 86.7 85.8 84.9  PLT 191 191 219 207 237  Cardiac Enzymes: No results for input(s): "CKTOTAL", "CKMB", "CKMBINDEX", "TROPONINI" in the last 168 hours.  BNP: Invalid input(s): "POCBNP"  CBG: Recent Labs  Lab 06/22/22 1555 06/22/22 1919 06/22/22 2356 06/23/22 0304 06/23/22 0736  GLUCAP 101* 116* 109* 98 109*     Microbiology: Results for orders placed or performed during the hospital encounter of 06/08/22  Blood Culture (routine x 2)     Status: None   Collection Time: 06/08/22  7:18 PM   Specimen: BLOOD  Result Value Ref Range Status   Specimen Description BLOOD BLOOD LEFT ARM  Final   Special Requests   Final    BOTTLES DRAWN AEROBIC AND ANAEROBIC Blood  Culture adequate volume   Culture   Final    NO GROWTH 5 DAYS Performed at [facility], [address], [place], [place] [address]    Report Status 06/13/2022 FINAL  Final  Blood Culture (routine x 2)     Status: None   Collection Time: 06/08/22  7:18 PM   Specimen: BLOOD  Result Value Ref Range Status   Specimen Description BLOOD BLOOD RIGHT ARM  Final   Special Requests   Final    BOTTLES DRAWN AEROBIC AND ANAEROBIC Blood Culture adequate volume   Culture   Final    NO GROWTH 5 DAYS Performed at [facility], [address] [place], [place] [address]    Report Status 06/13/2022 FINAL  Final  Resp panel by RT-PCR (RSV, Flu A&B, Covid) Anterior Nasal Swab     Status: None   Collection Time: 06/08/22  7:18 PM   Specimen: Anterior Nasal Swab  Result Value Ref Range Status   SARS Coronavirus 2 by RT PCR NEGATIVE NEGATIVE Final    Comment: (NOTE) SARS-CoV-2 target nucleic acids are NOT DETECTED.  The SARS-CoV-2 RNA is generally detectable in upper respiratory specimens during the acute phase of infection. The lowest concentration of SARS-CoV-2 viral copies this assay can detect is 138 copies/mL. A negative result does not preclude SARS-Cov-2 infection and should not be used as the sole basis for treatment or other patient management decisions. A negative result may occur with  improper specimen collection/handling, submission of specimen other than nasopharyngeal swab, presence of viral mutation(s) within the areas targeted by this assay, and inadequate number of viral copies(<138 copies/mL). A negative result must be combined with clinical observations, patient history, and epidemiological information. The expected result is Negative.  Fact Sheet for Patients:  EntrepreneurPulse.com.au  Fact Sheet for Healthcare Providers:  IncredibleEmployment.be  This test is no t yet approved or cleared by the [place] FDA and   has been authorized for detection and/or diagnosis of SARS-CoV-2 by FDA under an Emergency Use Authorization (EUA). This EUA will remain  in effect (meaning this test can be used) for the duration of the COVID-19 declaration under Section 564(b)(1) of the Act, 21 U.S.C.section 360bbb-3(b)(1), unless the authorization is terminated  or revoked sooner.       Influenza A by PCR NEGATIVE NEGATIVE Final   Influenza B by PCR NEGATIVE NEGATIVE Final    Comment: (NOTE) The Xpert Xpress SARS-CoV-2/FLU/RSV plus assay is intended as an aid in the diagnosis of influenza from Nasopharyngeal swab specimens and should not be used as a sole basis for treatment. Nasal washings and aspirates are unacceptable for Xpert Xpress SARS-CoV-2/FLU/RSV testing.  Fact Sheet for Patients: EntrepreneurPulse.com.au  Fact Sheet for Healthcare Providers: IncredibleEmployment.be  This test is not yet approved or cleared by the [place]  FDA and has been authorized for detection and/or diagnosis of SARS-CoV-2 by FDA under an Emergency Use Authorization (EUA). This EUA will remain in effect (meaning this test can be used) for the duration of the COVID-19 declaration under Section 564(b)(1) of the Act, 21 U.S.C. section 360bbb-3(b)(1), unless the authorization is terminated or revoked.     Resp Syncytial Virus by PCR NEGATIVE NEGATIVE Final    Comment: (NOTE) Fact Sheet for Patients: EntrepreneurPulse.com.au  Fact Sheet for Healthcare Providers: IncredibleEmployment.be  This test is not yet approved or cleared by the [place] FDA and has been authorized for detection and/or diagnosis of SARS-CoV-2 by FDA under an Emergency Use Authorization (EUA). This EUA will remain in effect (meaning this test can be used) for the duration of the COVID-19 declaration under Section 564(b)(1) of the Act, 21 U.S.C. section 360bbb-3(b)(1),  unless the authorization is terminated or revoked.  Performed at [facility], [address] [place], [place] [address]   Urine Culture     Status: None   Collection Time: 06/08/22  9:34 PM   Specimen: In/Out Cath Urine  Result Value Ref Range Status   Specimen Description   Final    IN/OUT CATH URINE Performed at [facility], [address] [place], [place] [address]    Special Requests   Final    NONE Performed at [facility], [address] [place], [place] [address]    Culture   Final    NO GROWTH Performed at [facility], [place] [address], [place], [place] [address]    Report Status 06/10/2022 FINAL  Final  MRSA Next Gen by PCR, Nasal     Status: None   Collection Time: 06/10/22 11:49 PM   Specimen: Nasal Mucosa; Nasal Swab  Result Value Ref Range Status   MRSA by PCR Next Gen NOT DETECTED NOT DETECTED Final    Comment: (NOTE) The GeneXpert MRSA Assay (FDA approved for NASAL specimens only), is one component of a comprehensive MRSA colonization surveillance program. It is not intended to diagnose MRSA infection nor to guide or monitor treatment for MRSA infections. Test performance is not FDA approved in patients less than 65 years old. Performed at [facility], [place], [place] [address]   Respiratory (~20 pathogens) panel by PCR     Status: None   Collection Time: 06/12/22  6:15 AM   Specimen: Nasopharyngeal Swab; Respiratory  Result Value Ref Range Status   Adenovirus NOT DETECTED NOT DETECTED Final   Coronavirus 229E NOT DETECTED NOT DETECTED Final    Comment: (NOTE) The Coronavirus on the Respiratory Panel, DOES NOT test for the novel  Coronavirus (2019 nCoV)    Coronavirus HKU1 NOT DETECTED NOT DETECTED Final   Coronavirus NL63 NOT DETECTED NOT DETECTED Final   Coronavirus OC43 NOT DETECTED NOT DETECTED Final   Metapneumovirus NOT DETECTED NOT DETECTED Final   Rhinovirus / Enterovirus NOT  DETECTED NOT DETECTED Final   Influenza A NOT DETECTED NOT DETECTED Final   Influenza B NOT DETECTED NOT DETECTED Final   Parainfluenza Virus 1 NOT DETECTED NOT DETECTED Final   Parainfluenza Virus 2 NOT DETECTED NOT DETECTED Final   Parainfluenza Virus 3 NOT DETECTED NOT DETECTED Final   Parainfluenza Virus 4 NOT DETECTED NOT DETECTED Final   Respiratory Syncytial Virus NOT DETECTED NOT DETECTED Final   Bordetella pertussis NOT DETECTED NOT DETECTED Final   Bordetella Parapertussis NOT DETECTED NOT DETECTED Final   Chlamydophila pneumoniae  NOT DETECTED NOT DETECTED Final   Mycoplasma pneumoniae NOT DETECTED NOT DETECTED Final    Comment: Performed at [facility], [place] [address], [place], [place] [address]  Culture, Respiratory w Gram Stain     Status: None   Collection Time: 06/17/22  8:15 AM   Specimen: Tracheal Aspirate; Respiratory  Result Value Ref Range Status   Specimen Description   Final    TRACHEAL ASPIRATE Performed at [facility], [address] [place], [place] [address]    Special Requests   Final    NONE Performed at [facility], [address] [place], [place] [address]    Gram Stain   Final    RARE WBC PRESENT, PREDOMINANTLY PMN RARE SQUAMOUS EPITHELIAL CELLS PRESENT FEW YEAST Performed at [facility], [place] [address], [place], [place] [address]    Culture FEW CANDIDA ALBICANS  Final   Report Status 06/19/2022 FINAL  Final    Coagulation Studies: No results for input(s): "LABPROT", "INR" in the last 72 hours.  Urinalysis: No results for input(s): "COLORURINE", "LABSPEC", "PHURINE", "GLUCOSEU", "HGBUR", "BILIRUBINUR", "KETONESUR", "PROTEINUR", "UROBILINOGEN", "NITRITE", "LEUKOCYTESUR" in the last 72 hours.  Invalid input(s): "APPERANCEUR"    Imaging: US Venous Img Lower Bilateral (DVT)  Result Date: 06/21/2022 EXAM: BILATERAL LOWER EXTREMITY VENOUS DOPPLER ULTRASOUND TECHNIQUE: Gray-scale sonography with graded  compression, as well as color Doppler and duplex ultrasound were performed to evaluate the lower extremity deep venous systems from the level of the common femoral vein and including the common femoral, femoral, profunda femoral, popliteal and calf veins including the posterior tibial, peroneal and gastrocnemius veins when visible. The superficial great saphenous vein was also interrogated. Spectral Doppler was utilized to evaluate flow at rest and with distal augmentation maneuvers in the common femoral, femoral and popliteal veins. COMPARISON:  None Available. FINDINGS: RIGHT LOWER EXTREMITY Common Femoral Vein: No evidence of thrombus. Normal compressibility, respiratory phasicity and response to augmentation. Saphenofemoral Junction: No evidence of thrombus. Normal compressibility and flow on color Doppler imaging. Profunda Femoral Vein: No evidence of thrombus. Normal compressibility and flow on color Doppler imaging. Femoral Vein: No evidence of thrombus. Normal compressibility, respiratory phasicity and response to augmentation. Popliteal Vein: No evidence of thrombus. Normal compressibility, respiratory phasicity and response to augmentation. Calf Veins: No evidence of thrombus. Normal flow on color Doppler imaging. LEFT LOWER EXTREMITY Common Femoral Vein: No evidence of thrombus. Normal compressibility, respiratory phasicity and response to augmentation. Saphenofemoral Junction: No evidence of thrombus. Normal compressibility and flow on color Doppler imaging. Profunda Femoral Vein: No evidence of thrombus. Normal compressibility and flow on color Doppler imaging. Femoral Vein: No evidence of thrombus. Normal compressibility, respiratory phasicity and response to augmentation. Popliteal Vein: No evidence of thrombus. Normal compressibility, respiratory phasicity and response to augmentation. Calf Veins: No evidence of thrombus. Normal flow on color Doppler imaging. IMPRESSION: No evidence of deep venous  thrombosis in either lower extremity. Electronically Signed   By: Albin Felling M.D.   On: 06/21/2022 13:23     Medications:    dextrose 50 mL/hr at 06/23/22 0810    budesonide (PULMICORT) nebulizer solution  0.5 mg Nebulization BID   Chlorhexidine Gluconate Cloth  6 each Topical Daily   fluocinonide-emollient   Topical QODAY   heparin  5,000 Units Subcutaneous Q8H   insulin aspart  0-20 Units Subcutaneous Q4H   ipratropium-albuterol  3 mL Nebulization Q6H   pantoprazole (PROTONIX) IV  40 mg Intravenous Q24H   docusate sodium, fentaNYL (  SUBLIMAZE) injection, ipratropium-albuterol, lip balm, ondansetron **OR** ondansetron (ZOFRAN) IV, mouth rinse, mouth rinse, phenol  Assessment/ Plan:  Samantha Mason is a 72 y.o.  female  with past medical history including COPD with renal failure requiring 2 L oxygen as needed, chronic diastolic heart failure, sleep apnea, morbid obesity, hypertension, and chronic kidney disease stage IV. Patient presents to the emergency department feeling sick and was admitted for Sepsis [facility]) [A41.9] Cellulitis, unspecified cellulitis site [L03.90] Community acquired pneumonia, unspecified laterality [J18.9] Sepsis, due to unspecified organism, unspecified whether acute organ dysfunction present [facility]) [A41.9]   Acute Kidney Injury on chronic kidney disease stage IIIb with baseline creatinine 1.55 and GFR of 36 on 04/17/22.  Acute kidney injury secondary to severe infection. No IV contrast exposure.  Diuretics remain held. Lower extremity edema present, however patient has history of lymphedema.   Creatinine remains elevated today, adequate urine output noted. Continue IVF for now. No acute need for dialysis. Will monitor for now.    Lab Results  Component Value Date   CREATININE 2.58 (H) 06/23/2022   CREATININE 2.48 (H) 06/22/2022   CREATININE 1.83 (H) 06/21/2022    Intake/Output Summary (Last 24 hours) at 06/23/2022 1104 Last data filed at 06/23/2022  0810 Gross per 24 hour  Intake 1267.38 ml  Output 1100 ml  Net 167.38 ml    2. Anemia of chronic kidney disease Lab Results  Component Value Date   HGB 8.5 (L) 06/23/2022    Hemoglobin decreased but stable for the past few days.   3.  Chronic diastolic heart failure.  Echo from 07/16/21 shows EF 55 to 60% with mild LVH.  Diuretics remain held  4.  Acute respiratory failure.  High flow [place], continue supportive care   LOS: [place] 2/4/202411:04 AM

## 2022-06-23 NOTE — Progress Notes (Signed)
Order placed for dobhoff placement. Patient agrees to placement, just not at this time. Requested to wait until speech evaluation. Patient requesting to rest undisturbed this afternoon and will notify staffing when ready for NG placement. RN at bedside multiple times to provide education regarding NG placement for adequate nutritional needs.

## 2022-06-23 NOTE — Progress Notes (Signed)
Rn contacted [facility] to follow up on unreleased Norovirus panel sent on 06-21-22. [facility] reports results will be available on 06-25-22. Precautions remain in place.

## 2022-06-23 NOTE — Progress Notes (Signed)
NAME:  Samantha Mason, MRN:  [id number], DOB:  [birth year], LOS: 45 ADMISSION DATE:  06/08/2022, CONSULTATION DATE:  06/11/2022 REFERRING MD:  Dr. Manuella Ghazi, CHIEF COMPLAINT:  Altered Mental Status, Hypercapnia  Brief Pt Description / Synopsis:  72 y.o. female with PMHx significant for HFpEF, COPD, OSA (noncompliant with CPAP), CKD Stage III who is  admitted with Sepsis in setting of lower extremity Cellulitis, AKI, and  Acute on Chronic Hypercapnic Respiratory Failure in setting AECOPD.  Course complicated by CO2 narcosis, failed BiPAP requiring intubation and mechanical ventilation.  History of Present Illness:  Samantha Mason is a 72 y.o. female with medical history significant for Morbid obesity, chronic HFpEF,CKD stage III, chronic lower extremity lymphedema on treatment with torsemide, hypertension, asthma, stroke (1995) and obstructive sleep apnea(non compliant with cpap) who presented to [facility] ED on 06/08/22 due to shortness of breath and generalized malaise for appropriately 1 week.  Pt is currently altered and no family currently available, therefore history is obtained from chart review.  Per ED and nursing notes, she reported about a 1 week history of feeling weak and fatigued, chills, bilateral lower extremity swelling and erythema (R>L), paroxsymal nocturnal dyspnea and orthopnea, weight gain with increased oxygen requirements.  She denied abdominal pain, N/V/D, dysuria.  ED Course: Initial Vital Signs: 105F orally, BP 129/53, HR 109, RR 27, SpO2 96% Significant Labs: BUN 39, Creatinine 1.59, Albumin 2.4, lactic acid 1.1, WBC 12.9, Hgb 8.0, Hct 27.7, Hgb A1c 5.8 COVID/FLU/RSV negative UA negative for UTI Imaging Chest X-ray>>IMPRESSION: Mild cardiomegaly with central pulmonary vascular congestion. CT Chest/Abdomen/Pelvis>> IMPRESSION: 1. Skin thickening and soft tissue edema of the partially imaged proximal right thigh. Correlate for cellulitis. 2. Newly enlarged bilateral inguinal  and iliac lymph nodes, likely reactive. 3. Diffuse bilateral bronchial wall thickening and mosaic attenuation of the airspaces, consistent with nonspecific infectious or inflammatory bronchitis. 4. Cardiomegaly.  She met Sepsis criteria, therefore she received IV fluids and antibiotics. Hospitalists were asked to admit for further workup and treatment os sepsis due to cellulitis.  Please see "significant hospital events" section below for full detailed hospital course.  Pertinent  Medical History   Past Medical History:  Diagnosis Date   Acute bronchitis    Anemia    Arthritis    Asthma    B12 deficiency 03/14/2017   Chest pain 07/02/2018   Chest pain    a. 06/2018 MV: EF 66%. No ischemia/infarct; b. 02/2021 PET CT: EF>60%, no ischemia/infarct.   Chronic heart failure with preserved ejection fraction (HFpEF) ([place])    a. 08/2020 Echo: EF 55-60%, GrI DD; b. 06/2021 Echo: EF 55-60%, mild LVH, nl RV fxn, no signif valvular dzs.   COPD (chronic obstructive pulmonary disease) ([place]) 07/24/2018   Family history of blood clots 07/16/2017   Irritable bowel syndrome    Malignant hypertension    Obesity    OSA (obstructive sleep apnea)    uses cpap   Pneumonia 2017   Restless leg syndrome    Stroke ([place]) 1995   mild    Micro Data:  1/20: SARS-CoV-2/influenza/RSV PCR>> negative 1/20: Blood culture x 2>> no growth  1/20: Urine>> negative 1/22: MRSA PCR>>negative  1/24: Respiratory viral panel>>negative  1/26: Tracheal aspirate>> not collected 1/29: Tracheal aspirate>> few candida albicans 2/02: GI panel>>  Antimicrobials:   Anti-infectives (From admission, onward)    Start     Dose/Rate Route Frequency Ordered Stop   06/14/22 1400  ceFEPIme (MAXIPIME) 2 g in sodium chloride 0.9 % 100  mL IVPB        2 g 200 mL/hr over 30 Minutes Intravenous Every 12 hours 06/14/22 1301 06/21/22 0236   06/13/22 1000  ceFEPIme (MAXIPIME) 2 g in sodium chloride 0.9 % 100 mL IVPB  Status:   Discontinued        2 g 200 mL/hr over 30 Minutes Intravenous Every 24 hours 06/12/22 1430 06/13/22 0747   06/13/22 0900  cefTRIAXone (ROCEPHIN) 2 g in sodium chloride 0.9 % 100 mL IVPB  Status:  Discontinued        2 g 200 mL/hr over 30 Minutes Intravenous Every 24 hours 06/13/22 0747 06/14/22 1230   06/11/22 1145  vancomycin (VANCOREADY) IVPB 2000 mg/400 mL        2,000 mg 200 mL/hr over 120 Minutes Intravenous  Once 06/11/22 1047 06/11/22 1344   06/11/22 1145  ceFEPIme (MAXIPIME) 2 g in sodium chloride 0.9 % 100 mL IVPB  Status:  Discontinued        2 g 200 mL/hr over 30 Minutes Intravenous 2 times daily 06/11/22 1047 06/12/22 1430   06/10/22 2300  vancomycin (VANCOCIN) IVPB 1000 mg/200 mL premix  Status:  Discontinued       See Hyperspace for full Linked Orders Report.   1,000 mg 200 mL/hr over 60 Minutes Intravenous Every 48 hours 06/08/22 2307 06/09/22 0922   06/10/22 2300  vancomycin (VANCOREADY) IVPB 1250 mg/250 mL  Status:  Discontinued       See Hyperspace for full Linked Orders Report.   1,250 mg 166.7 mL/hr over 90 Minutes Intravenous Every 48 hours 06/08/22 2307 06/09/22 0922   06/10/22 1800  cephALEXin (KEFLEX) capsule 500 mg  Status:  Discontinued        500 mg Oral Every 6 hours 06/10/22 1521 06/11/22 1047   06/09/22 2200  vancomycin (VANCOREADY) IVPB 750 mg/150 mL  Status:  Discontinued        750 mg 150 mL/hr over 60 Minutes Intravenous Every 24 hours 06/09/22 0923 06/10/22 1521   06/09/22 1000  ceFEPIme (MAXIPIME) 2 g in sodium chloride 0.9 % 100 mL IVPB  Status:  Discontinued        2 g 200 mL/hr over 30 Minutes Intravenous Every 12 hours 06/09/22 0916 06/10/22 1521   06/08/22 2300  vancomycin (VANCOCIN) IVPB 1000 mg/200 mL premix  Status:  Discontinued        1,000 mg 200 mL/hr over 60 Minutes Intravenous  Once 06/08/22 2257 06/08/22 2307   06/08/22 2215  vancomycin (VANCOCIN) IVPB 1000 mg/200 mL premix       See Hyperspace for full Linked Orders Report.   1,000  mg 200 mL/hr over 60 Minutes Intravenous  Once 06/08/22 2210 06/09/22 0014   06/08/22 2215  vancomycin (VANCOREADY) IVPB 1500 mg/300 mL       See Hyperspace for full Linked Orders Report.   1,500 mg 150 mL/hr over 120 Minutes Intravenous  Once 06/08/22 2210 06/10/22 0807   06/08/22 1915  piperacillin-tazobactam (ZOSYN) IVPB 3.375 g        3.375 g 100 mL/hr over 30 Minutes Intravenous  Once 06/08/22 1909 06/08/22 [facility] Events: Including procedures, antibiotic start and stop dates in addition to other pertinent events   1/20: Presented to ED, admitted by [facility] 1/21: Added cefepime, continue vancomycin, increase dose of Cardizem for better heart rate control 1/22: Had to reduce Cardizem dose back to her home dose as blood pressure dropped 1/23:  Transferred to ICU overnight due to CO2 narcosis, placed on BiPAP.  PCCM consulted.  Worsening AKI, holding diuresis.  Agitation requiring Precedex, high risk for Intubation.  Palliative Care consulted for [place] conversations.  ABX broadened back to Cefepime and Vancomycin 1/24: Pt on Bipap but awake and able to follow commands.  Will attempt to transition off Bipap.  Palliative Care following along to assist with goals of care conversations  1/25: Pt currently on Bipap and precedex gtt.  Required 2.5 mg iv valium overnight for delirium.  Unable to obtain CT Head due to respiratory status.  Ultimately required intubation. 1/26: Remains critically ill. Issues with vent dyschrony and ventilation, plan to start Nimbex gtt. Creatinine improved with diuresis, continue with diuresis, Nephrology considering Lasix gtt. 1/27: ABG's improved on Nimbex, persistent cuff leak, exchanged ETT. Decadron started due to upper airway swelling. Will attempt to d/c Nimbex. Good diuresis with Lasix gtt with 4.5 L of UOP last 24 hrs, Creatinine improving. 1/28: Continues with good diuresis on Lasix gtt, 3L of UOP over preceding 24 hrs, creatinine  continues to improve.  Vent requirements slowly improving. 1/29: No acute events overnight. Remains mechanically intubated with minimal vent settings: PEEP 5/FiO2 40%.  Remains on lasix gtt '@2mg'$ /hr.  Not on sedation and attempting to follow commands but extremely weak.  Will perform SBT as tolerated   1/30: No acute events overnight.  Pt awake and following commands on precedex gtt '@0'$ .2 mcg/kg/hr.  Lasix gtt '@4'$  mg/hr. Tolerating SBT 15/8 1/31: No acute events, awake and following commands on low dose precedex.  Remains on Lasix gtt @ '4mg'$ /hr,  Creatinine remains stable BUN slowly uptrending. Tolerating SBT 15/8 ~ will continue to wean PSV as tolerated ~ EXTUBATED. Lasix gtt d/c following extubation.  Following extubation, pt confirmed DNR/DNI status in presence of her son at bedside. 2/1: Tolerated BiPAP overnight, weaned to [facility] this am.  Speech evaluation performed, which pt FAILED. Consult PT/OT.  Creatinine continues to improve, started on D5W @ 50 ml/hr due to Hypernatremia of 155.  Holding diuresis today 2/2: Pt febrile currently on Bipap '@50'$ %.  Pt awake and following commands.  Failed speech evaluation on 02/1 and declining NGT placement.  2/2: Venous US BLE: No evidence of deep venous thrombosis in either lower extremity.  2/3: During goals of care discussion pt agreeable to small bore NGT placement and dialysis if needed.  She confirmed DNR/DNI  2/4: Nursing staff reporting pt now tolerating ice chips without signs of aspiration.  Will contact speech therapy to evaluate pt again.  Dobhoff placement pending.  No acute events overnight  Interim History / Subjective:  Pt complains of severe bilateral lower extremity pain.  She reports she is not getting adequate pain control with prn fentanyl.  She is currently on HHFNC '@30'$ %  Objective   Blood pressure (!) 120/93, pulse 96, temperature 98 F (36.7 C), temperature source Oral, resp. rate (!) 27, height '5\' 4"'$  (1.626 m), weight (!) 143.7 kg, SpO2 98  %.    FiO2 (%):  [33 %] 33 %   Intake/Output Summary (Last 24 hours) at 06/23/2022 0811 Last data filed at 06/23/2022 0810 Gross per 24 hour  Intake 1713.91 ml  Output 1100 ml  Net 613.91 ml   Filed Weights   06/21/22 0500 06/22/22 0514 06/23/22 0600  Weight: (!) 140 kg (!) 141.4 kg (!) 143.7 kg    Examination: General: Acute on chronically ill-appearing obese female, laying in bed, NAD on HHFNC  HENT: Supple, difficult  to assess JVD due to body habitus  Lungs: Faint rhonchi throughout, even, non labored  Cardiovascular: NSR, s1s2, no m/r/g, 2+ radial/1+ distal pulses, 1+ generalized edema   Abdomen: +BS x4, obese, non distended, non tender  Extremities: Bilateral LE lymphedema with unna boots in place, bilateral lower extremity tenderness  Neuro: Alert and oriented, following commands, speech clear, PERRLA GU: Indwelling foley catheter draining clear yellow urine  Skin: Yeast excoriation to abdominal fold/groins  Resolved Hospital Problem list     Assessment & Plan:   #Acute on Chronic Hypercapnic Respiratory Failure in setting of AECOPD & acute CHF exacerbation vs ARDS PMHx: OSA (noncompliant with CPAP at home), morbid obesity with probable OHS EXTUBATED 06/19/22 - Supplemental O2 as needed to maintain O2 sats 88 to 92% - BiPAP, wean as tolerated ~ BiPAP qhs  (pt confirms DNR/DNI) - Follow intermittent Chest X-ray & ABG as needed - Bronchodilators & Pulmicort nebs - Diuresis as BP and renal function permits ~ holding pt currently net negative along with worsening renal failure - Aggressive Pulmonary toilet as able - PT/OT consulted to mobilize as tolerated  #Acute on Chronic HFpEF #Hypotension ~ RESOLVED PMHx: HTN Echocardiogram 06/12/22: LVEF 60-65%, Grade III DD, RV systolic function normal, mild to moderate MR - Continuous cardiac monitoring - Maintain MAP >65  #Sepsis secondary to Cellulitis of Lower extremities~treated  #Fevers~resolved   #Diarrhea~improving   (Met SIRS Criteria: HR >90, RR >20, fever) - Trend WBC and monitor fever curve - Completed coarse of abx  - Trend PCT  - GI panel pending   #Acute Kidney Injury on CKD III~worsening  #Hypernatremia~improving  - Trend BMP  - Replace electrolytes as indicated  - Monitor UOP - Avoid nephrotoxic medication as able  - Nephrology following, appreciate input  - Continue D5W '@50'$  cc/hr, holding diuresis 2/4  #Anemia of Chronic Disease - Trend CBC - Monitor for s/sx of bleeding  - Transfuse for hgb <7  #Endo - CBG's q4hrs  - SSI   #Acute pain  - Continue prn fentanyl  - Once able to receive po medications will restart outpatient requip   #Acute Metabolic Encephalopathy in the setting of CO2 Narcosis and multiple metabolic derangements~RESOLVED CT Head 1/25 negative for acute intracranial abnormality EEG 1/26 negative for seizures - Treatment of metabolic derangements and hypercapnia as outlined above - Provide supportive care - Promote normal sleep/wake cycle and family presence - Avoid sedating meds as able - PT/OT/Speech   Best Practice (right click and "Reselect all SmartList Selections" daily)   Diet/type: NPO, Pt pending speech reevaluation and Dobhoff placement  DVT prophylaxis: prophylactic heparin  GI prophylaxis: PPI Lines: Left IJ, and is still needed Foley: NA Code Status:  DNR/DNI Last date of multidisciplinary goals of care discussion [06/23/22]  2/4: Updated pt regarding current plan of care and all questions answered   CBC: Recent Labs  Lab 06/19/22 0343 06/20/22 0444 06/21/22 0500 06/22/22 0455 06/23/22 0408  WBC 11.3* 12.5* 12.2* 11.8* 10.4  NEUTROABS  --   --   --  8.8* 7.9*  HGB 8.4* 8.4* 8.6* 8.3* 8.5*  HCT 29.0* 30.1* 30.6* 29.6* 29.9*  MCV 83.1 86.5 86.7 85.8 84.9  PLT 191 191 219 207 505    Basic Metabolic Panel: Recent Labs  Lab 06/19/22 0343 06/20/22 0444 06/21/22 0500 06/21/22 0530 06/22/22 0455 06/23/22 0408  NA 149* 155*  --   158* 151* 150*  K 3.9 3.5  --  4.0 3.3* 3.6  CL 104 106  --  112* 108 108  CO2 37* 39*  --  37* 33* 30  GLUCOSE 138* 129*  --  111* 129* 110*  BUN 123* 109*  --  87* 94* 88*  CREATININE 1.54* 1.48*  --  1.83* 2.48* 2.58*  CALCIUM 9.2 9.3  --  9.1 9.1 9.0  MG 2.0 2.2 2.3  --  2.3 2.2  PHOS 3.3 4.4  --  3.9 3.8 4.1   GFR: Estimated Creatinine Clearance: 28.9 mL/min (A) (by C-G formula based on SCr of 2.58 mg/dL (H)). Recent Labs  Lab 06/17/22 0406 06/18/22 0423 06/20/22 0444 06/21/22 0500 06/22/22 0455 06/23/22 0408  PROCALCITON 0.20  --   --  0.58 0.58  --   WBC 11.7*   < > 12.5* 12.2* 11.8* 10.4   < > = values in this interval not displayed.    Liver Function Tests: Recent Labs  Lab 06/17/22 0406 06/18/22 0423 06/19/22 0343 06/20/22 0444 06/21/22 0530  ALBUMIN 2.5* 2.6* 2.5* 2.7* 2.6*   No results for input(s): "LIPASE", "AMYLASE" in the last 168 hours. No results for input(s): "AMMONIA" in the last 168 hours.   ABG    Component Value Date/Time   PHART 7.43 06/17/2022 1026   PCO2ART 18 (LL) 06/17/2022 1026   PO2ART 149 (H) 06/17/2022 1026   HCO3 38.6 (H) 06/22/2022 0500   ACIDBASEDEF 9.8 (H) 06/17/2022 1026   O2SAT 78.2 06/22/2022 0500     Coagulation Profile: No results for input(s): "INR", "PROTIME" in the last 168 hours.   Cardiac Enzymes: No results for input(s): "CKTOTAL", "CKMB", "CKMBINDEX", "TROPONINI" in the last 168 hours.  HbA1C: Hgb A1c MFr Bld  Date/Time Value Ref Range Status  06/08/2022 07:16 AM 5.8 (H) 4.8 - 5.6 % Final    Comment:    (NOTE) Pre diabetes:          5.7%-6.4%  Diabetes:              >6.4%  Glycemic control for   <7.0% adults with diabetes   07/15/2021 04:30 PM 5.4 4.8 - 5.6 % Final    Comment:    (NOTE) Pre diabetes:          5.7%-6.4%  Diabetes:              >6.4%  Glycemic control for   <7.0% adults with diabetes     CBG: Recent Labs  Lab 06/22/22 1555 06/22/22 1919 06/22/22 2356 06/23/22 0304  06/23/22 0736  GLUCAP 101* 116* 109* 98 109*    Review of Systems:   Positives in BOLD: Gen: Denies fever, chills, weight change, fatigue, night sweats HEENT: Denies blurred vision, double vision, hearing loss, tinnitus, sinus congestion, rhinorrhea, sore throat, neck stiffness, dysphagia PULM: Denies shortness of breath, cough, sputum production, hemoptysis, wheezing CV: Denies chest pain, edema, orthopnea, paroxysmal nocturnal dyspnea, palpitations GI: Denies abdominal pain, nausea, vomiting, diarrhea, hematochezia, melena, constipation, change in bowel habits GU: Denies dysuria, hematuria, polyuria, oliguria, urethral discharge Endocrine: Denies hot or cold intolerance, polyuria, polyphagia or appetite change Derm: Denies rash, dry skin, scaling or peeling skin change Heme: Denies easy bruising, bleeding, bleeding gums Neuro: headache, numbness, weakness, slurred speech, loss of memory or consciousness  Past Medical History:  She,  has a past medical history of Acute bronchitis, Anemia, Arthritis, Asthma, B12 deficiency (03/14/2017), Chest pain (07/02/2018), Chest pain, Chronic heart failure with preserved ejection fraction (HFpEF) (HCC), COPD (chronic obstructive pulmonary disease) ([place]) (07/24/2018), Family history of blood clots (07/16/2017), Irritable bowel syndrome, Malignant  hypertension, Obesity, OSA (obstructive sleep apnea), Pneumonia (2017), Restless leg syndrome, and Stroke ([place]) (1995).   Surgical History:   Past Surgical History:  Procedure Laterality Date   CHOLECYSTECTOMY     COLONOSCOPY WITH PROPOFOL N/A 03/31/2018   Procedure: COLONOSCOPY WITH PROPOFOL;  Surgeon: Lucilla Lame, MD;  Location[facility] ENDOSCOPY;  Service: Endoscopy;  Laterality: N/A;   KNEE ARTHROPLASTY Right 04/30/2019   Procedure: COMPUTER ASSISTED TOTAL KNEE ARTHROPLASTY;  Surgeon: Dereck Leep, MD;  Location: [facility] ORS;  Service: Orthopedics;  Laterality: Right;   RIGHT HEART CATH N/A 01/30/2022    Procedure: RIGHT HEART CATH;  Surgeon: Nelva Bush, MD;  Location: [facility];  Service: Cardiovascular;  Laterality: N/A;   saliva gland removal  1998   TOTAL KNEE ARTHROPLASTY Left 2014   TUBAL LIGATION Bilateral      Social History:   reports that she has never smoked. She has never used smokeless tobacco. She reports that she does not drink alcohol and does not use drugs.   Family History:  Her family history includes COPD in her father; Coronary artery disease (age of onset: 21) in her brother; Dementia in her mother; Diabetes in her father; Heart failure in her brother and maternal grandmother; Stroke in her maternal grandmother. There is no history of Breast cancer.   Allergies Allergies  Allergen Reactions   Nuvigil [Armodafinil] Hives   Penicillins Diarrhea and Nausea And Vomiting    Did it involve swelling of the face/tongue/throat, SOB, or low BP? no Did it involve sudden or severe rash/hives, skin peeling, or any reaction on the inside of your mouth or nose? No Did you need to seek medical attention at a hospital or doctor's office? No When did it last happen?  in her 39s    If all above answers are "NO", may proceed with cephalosporin use.    Spironolactone Itching   Gabapentin Other (See Comments)    "wiped her out, couldn't stay awake"   Melatonin Other (See Comments)    "Brain fog"   Provigil [Modafinil] Hives   Entresto [Sacubitril-Valsartan] Itching   Lisinopril Cough     Home Medications  Prior to Admission medications   Medication Sig Start Date End Date Taking? Authorizing Provider  acetaminophen (TYLENOL) 500 MG tablet Take 1,000 mg by mouth every 6 (six) hours as needed.   Yes [provider]  benzonatate (TESSALON) 100 MG capsule TAKE 1 CAPSULE BY MOUTH THREE TIMES A DAY AS NEEDED FOR COUGH 03/08/22  Yes Tyler Pita, MD  calcitRIOL (ROCALTROL) 0.25 MCG capsule Take 0.25 mcg by mouth daily. 02/22/22  Yes [provider]  dapagliflozin propanediol (FARXIGA) 10 MG TABS tablet Take 1 tablet (10 mg total) by mouth daily before breakfast. 03/07/22  Yes Hackney, Tina A, FNP  diltiazem (CARDIZEM CD) 240 MG 24 hr capsule Take 1 capsule (240 mg total) by mouth daily. 09/05/21  Yes Theora Gianotti, NP  ipratropium-albuterol (DUONEB) 0.5-2.5 (3) MG/3ML SOLN TAKE 3 MLS BY NEBULIZATION EVERY 6 (SIX) HOURS AS NEEDED. J45.40 03/04/22  Yes Tyler Pita, MD  metolazone (ZAROXOLYN) 2.5 MG tablet Take 1 tablet (2.5 mg total) by mouth 2 (two) times a week. Patient taking differently: Take 2.5 mg by mouth 2 (two) times a week. Monday and Friday 02/04/22  Yes Wouk, Ailene Rud, MD  ondansetron [facility]) 4 MG tablet Take 1 tablet (4 mg total) by mouth daily as needed for nausea or vomiting. 05/10/22 05/10/23 Yes Lorella Nimrod, MD  pantoprazole (PROTONIX) 40 MG tablet TAKE 1 TABLET BY MOUTH EVERY DAY 12/11/21  Yes Glean Hess, MD  PREVALITE 4 g packet DISSOLVE AND DRINK 1 PACKET BY MOUTH AT BEDTIME 03/05/22  Yes Glean Hess, MD  rOPINIRole (REQUIP) 1 MG tablet Take 1 tablet (1 mg total) by mouth 3 (three) times daily. 02/13/22  Yes Glean Hess, MD  Torsemide 40 MG TABS Take 40 mg by mouth daily.   Yes [provider]  budesonide (PULMICORT) 0.5 MG/2ML nebulizer solution TAKE 2 ML (0.5 MG TOTAL) BY NEBULIZATION TWICE A DAY Patient not taking: Reported on 06/08/2022 10/29/21   Tyler Pita, MD  feeding supplement (ENSURE ENLIVE / ENSURE PLUS) LIQD Take 237 mLs by mouth 3 (three) times daily between meals. 05/10/22   Lorella Nimrod, MD  metoCLOPramide (REGLAN) 5 MG tablet Take 1 tablet (5 mg total) by mouth 3 (three) times daily before meals. Patient not taking: Reported on 06/08/2022 05/12/22   Sharen Hones, MD  NON FORMULARY Bipap - nightly with Oxygen 2 Lpm    [provider]  oxyCODONE 7.5 MG TABS Take 7.5 mg by mouth every 6 (six) hours as needed. 05/10/22 05/10/23  Lorella Nimrod, MD   OXYGEN Inhale into the lungs. 2 LPM [place]    [provider]  promethazine (PHENERGAN) 25 MG tablet Take 1 tablet (25 mg total) by mouth every 6 (six) hours as needed. Patient not taking: Reported on 06/08/2022 05/10/22 05/10/23  Lorella Nimrod, MD     Critical care time: 41 minutes    Donell Beers, [place] Pager [phone] (please enter 7 digits) PCCM Consult Pager [phone] (please enter 7 digits)

## 2022-06-24 DIAGNOSIS — R6520 Severe sepsis without septic shock: Secondary | ICD-10-CM | POA: Diagnosis not present

## 2022-06-24 DIAGNOSIS — Z66 Do not resuscitate: Secondary | ICD-10-CM | POA: Diagnosis not present

## 2022-06-24 DIAGNOSIS — L0390 Cellulitis, unspecified: Secondary | ICD-10-CM | POA: Diagnosis not present

## 2022-06-24 DIAGNOSIS — A419 Sepsis, unspecified organism: Secondary | ICD-10-CM | POA: Diagnosis not present

## 2022-06-24 DIAGNOSIS — Z515 Encounter for palliative care: Secondary | ICD-10-CM | POA: Diagnosis not present

## 2022-06-24 LAB — BASIC METABOLIC PANEL
Anion gap: 12 (ref 5–15)
BUN: 78 mg/dL — ABNORMAL HIGH (ref 8–23)
CO2: 29 mmol/L (ref 22–32)
Calcium: 8.7 mg/dL — ABNORMAL LOW (ref 8.9–10.3)
Chloride: 102 mmol/L (ref 98–111)
Creatinine, Ser: 2.62 mg/dL — ABNORMAL HIGH (ref 0.44–1.00)
GFR, Estimated: 19 mL/min — ABNORMAL LOW (ref >60)
Glucose, Bld: 96 mg/dL (ref 70–99)
Potassium: 3.3 mmol/L — ABNORMAL LOW (ref 3.5–5.1)
Sodium: 143 mmol/L (ref 135–145)

## 2022-06-24 LAB — CBC WITH DIFFERENTIAL/PLATELET
Abs Immature Granulocytes: 0.04 10*3/uL (ref 0.00–0.07)
Basophils Absolute: 0 10*3/uL (ref 0.0–0.1)
Basophils Relative: 0 %
Eosinophils Absolute: 0.5 10*3/uL (ref 0.0–0.5)
Eosinophils Relative: 5 %
HCT: 28 % — ABNORMAL LOW (ref 36.0–46.0)
Hemoglobin: 8 g/dL — ABNORMAL LOW (ref 12.0–15.0)
Immature Granulocytes: 0 %
Lymphocytes Relative: 14 %
Lymphs Abs: 1.4 10*3/uL (ref 0.7–4.0)
MCH: 24.2 pg — ABNORMAL LOW (ref 26.0–34.0)
MCHC: 28.6 g/dL — ABNORMAL LOW (ref 30.0–36.0)
MCV: 84.6 fL (ref 80.0–100.0)
Monocytes Absolute: 0.6 10*3/uL (ref 0.1–1.0)
Monocytes Relative: 6 %
Neutro Abs: 7.3 10*3/uL (ref 1.7–7.7)
Neutrophils Relative %: 75 %
Platelets: 217 10*3/uL (ref 150–400)
RBC: 3.31 MIL/uL — ABNORMAL LOW (ref 3.87–5.11)
RDW: 18.6 % — ABNORMAL HIGH (ref 11.5–15.5)
WBC: 9.8 10*3/uL (ref 4.0–10.5)
nRBC: 0 % (ref 0.0–0.2)

## 2022-06-24 LAB — MAGNESIUM: Magnesium: 1.8 mg/dL (ref 1.7–2.4)

## 2022-06-24 LAB — PHOSPHORUS: Phosphorus: 4.5 mg/dL (ref 2.5–4.6)

## 2022-06-24 LAB — GLUCOSE, CAPILLARY
Glucose-Capillary: 120 mg/dL — ABNORMAL HIGH (ref 70–99)
Glucose-Capillary: 116 mg/dL — ABNORMAL HIGH (ref 70–99)
Glucose-Capillary: 111 mg/dL — ABNORMAL HIGH (ref 70–99)
Glucose-Capillary: 93 mg/dL (ref 70–99)

## 2022-06-24 MED ORDER — GERHARDT'S BUTT CREAM
TOPICAL_CREAM | Freq: Three times a day (TID) | CUTANEOUS | Status: DC
  Administered 2022-06-26 – 2022-07-01 (×4): 1 via TOPICAL
  Filled 2022-06-24 (×5): qty 1

## 2022-06-24 MED ORDER — ENSURE ENLIVE PO LIQD
237.0000 mL | Freq: Three times a day (TID) | ORAL | Status: DC
  Administered 2022-06-24 – 2022-07-09 (×16): 237 mL via ORAL

## 2022-06-24 MED ORDER — MAGNESIUM SULFATE 2 GM/50ML IV SOLN
2.0000 g | Freq: Once | INTRAVENOUS | Status: AC
  Administered 2022-06-24: 2 g via INTRAVENOUS
  Filled 2022-06-24: qty 50

## 2022-06-24 MED ORDER — IPRATROPIUM-ALBUTEROL 0.5-2.5 (3) MG/3ML IN SOLN
3.0000 mL | Freq: Two times a day (BID) | RESPIRATORY_TRACT | Status: DC
  Administered 2022-06-24 – 2022-06-27 (×7): 3 mL via RESPIRATORY_TRACT
  Filled 2022-06-24 (×7): qty 3

## 2022-06-24 MED ORDER — POTASSIUM CHLORIDE 10 MEQ/50ML IV SOLN
10.0000 meq | INTRAVENOUS | Status: AC
  Administered 2022-06-24 (×3): 10 meq via INTRAVENOUS
  Filled 2022-06-24 (×3): qty 50

## 2022-06-24 MED ORDER — ADULT MULTIVITAMIN W/MINERALS CH
1.0000 | ORAL_TABLET | Freq: Every day | ORAL | Status: DC
  Administered 2022-06-25 – 2022-07-09 (×14): 1 via ORAL
  Filled 2022-06-24 (×14): qty 1

## 2022-06-24 NOTE — Progress Notes (Signed)
PT had a 6 second run of SVT, MD notified with no new orders given. Will continue to monitor.

## 2022-06-24 NOTE — Progress Notes (Signed)
Occupational Therapy Treatment Patient Details Name: Samantha Mason MRN: [id number] DOB: 1951-02-15 Today's Date: 06/24/2022   History of present illness 72 y/o female with h/o morbid obesity, OSA, HTN, COPD, CHF, CKD IV, GERD, IBS, RLS and recent admission for SBO (medically managed) who is admitted with CHF, sepsis, UTI, AKI and cellulitis. Pt extubated on 1/31.   OT comments  Upon entering the room, pt supine in bed and agreeable to OT intervention. Pt with significant change in oxygen requirement since initial evaluation. Pt is motivated and performs bed mobility with max A to EOB. Pt needing min guard for sitting balance. Linens needing to be changed. Pt stands with RW and max A for ~ 30 seconds while bed linens changed and pt returning to bed. Pt's back washed as she reports it is itching her. Sit >supine with max A and use of +2 to fully reposition and pull her up in bed for comfort. All needs within reach and pt making progress towards goals. Recommendation upgraded to SNF at hospital discharge to continue to address deficits.    Recommendations for follow up therapy are one component of a multi-disciplinary discharge planning process, led by the attending physician.  Recommendations may be updated based on patient status, additional functional criteria and insurance authorization.    Follow Up Recommendations  Skilled nursing-short term rehab (<3 hours/day)     Assistance Recommended at Discharge Intermittent Supervision/Assistance  Patient can return home with the following  Two people to help with walking and/or transfers;Two people to help with bathing/dressing/bathroom;Assistance with cooking/housework;Help with stairs or ramp for entrance;Assist for transportation;Direct supervision/assist for financial management;Direct supervision/assist for medications management   Equipment Recommendations  Other (comment) (defer to next venue of care)       Precautions / Restrictions  Precautions Precautions: Fall       Mobility Bed Mobility Overal bed mobility: Needs Assistance Bed Mobility: Supine to Sit, Sit to Supine     Supine to sit: Max assist Sit to supine: Max assist        Transfers Overall transfer level: Needs assistance Equipment used: Rolling walker (2 wheels) Transfers: Sit to/from Stand Sit to Stand: Max assist           General transfer comment: Pt standing for ~ 30 seconds to remove linen from bed and place clean chux under pt     Balance Overall balance assessment: Needs assistance Sitting-balance support: Feet unsupported, Bilateral upper extremity supported Sitting balance-Leahy Scale: Fair     Standing balance support: Reliant on assistive device for balance, During functional activity, Bilateral upper extremity supported Standing balance-Leahy Scale: Poor                             ADL either performed or assessed with clinical judgement   ADL Overall ADL's : Needs assistance/impaired                                             Vision Patient Visual Report: No change from baseline            Cognition Arousal/Alertness: Awake/alert Behavior During Therapy: WFL for tasks assessed/performed Overall Cognitive Status: Within Functional Limits for tasks assessed  General Comments: Pt is pleasant throughout and follows commands with increased time                   Pertinent Vitals/ Pain       Pain Assessment Pain Assessment: No/denies pain  Home Living Family/patient expects to be discharged to:: Skilled nursing facility                                            Frequency  Min 2X/week        Progress Toward Goals  OT Goals(current goals can now be found in the care plan section)  Progress towards OT goals: Progressing toward goals  Acute Rehab OT Goals Patient Stated Goal: to feel better and go to  rehab OT Goal Formulation: With patient Time For Goal Achievement: 07/04/22 Potential to Achieve Goals: [place] Frequency remains appropriate;Discharge plan needs to be updated       AM-PAC OT "6 Clicks" Daily Activity     Outcome Measure   Help from another person eating meals?: A Little Help from another person taking care of personal grooming?: A Little Help from another person toileting, which includes using toliet, bedpan, or urinal?: Total Help from another person bathing (including washing, rinsing, drying)?: A Lot Help from another person to put on and taking off regular upper body clothing?: A Little Help from another person to put on and taking off regular lower body clothing?: A Lot 6 Click Score: 14    End of Session Equipment Utilized During Treatment: Oxygen (2Ls)  OT Visit Diagnosis: Muscle weakness (generalized) (M62.81);Unsteadiness on feet (R26.81)   Activity Tolerance Patient limited by fatigue   Patient Left in bed;with call bell/phone within reach;with bed alarm set   Nurse Communication Mobility status        Time: 3474-2595 OT Time Calculation (min): 29 min  Charges: OT General Charges $OT Visit: 1 Visit OT Treatments $Self Care/Home Management : 8-22 mins $Therapeutic Activity: 8-22 mins  Darleen Crocker, MS, OTR/L , CBIS ascom [phone]  06/24/22, 1:39 PM

## 2022-06-24 NOTE — Progress Notes (Signed)
[facility]  ROUNDING NOTE   Subjective:   Samantha Mason  is a 72 year old female with past medical history including COPD with renal failure requiring 2 L oxygen as needed, chronic diastolic heart failure, sleep apnea, morbid obesity, hypertension, and chronic kidney disease stage IV. Patient presents to the emergency department feeling sick for a few days.  She was admitted for Sepsis [facility]) [A41.9] Cellulitis, unspecified cellulitis site [L03.90] Community acquired pneumonia, unspecified laterality [J18.9] Sepsis, due to unspecified organism, unspecified whether acute organ dysfunction present [facility]) [A41.9]  Patient is followed by our office outpatient. She was last seen in office on Apr 17, 2022.   Weaned to 2L [place] States she feels well, continues to request sweet tea Remains on IVF, D5 @ 6m/hr Lower extremity edema decreased   02/04 0701 - 02/05 0700 In: 1257.2 [I.V.:1197.2] Out: 335 [Urine:300; Stool:35] Lab Results  Component Value Date   CREATININE 2.62 (H) 06/24/2022   CREATININE 2.58 (H) 06/23/2022   CREATININE 2.48 (H) 06/22/2022      Objective:  Vital signs in last 24 hours:  Temp:  [98.2 F (36.8 C)-98.7 F (37.1 C)] 98.2 F (36.8 C) (02/05 0740) Pulse Rate:  [80-109] 92 (02/05 1000) Resp:  [17-45] 21 (02/05 1000) BP: (102-146)/(46-78) 128/51 (02/05 1000) SpO2:  [92 %-100 %] 98 % (02/05 1000) FiO2 (%):  [28 %-50 %] 28 % (02/05 0107)  Weight change:  Filed Weights   06/21/22 0500 06/22/22 0514 06/23/22 0600  Weight: (!) 140 kg (!) 141.4 kg (!) 143.7 kg    Intake/Output: I/O last 3 completed shifts: In: 1737.2 [P.O.:480; I.V.:1197.2; Other:60] Out: 935 [Urine:900; Stool:35]   Intake/Output this shift:  Total I/O In: 787.4 [I.V.:737.4; IV Piggyback:50] Out: -   Physical Exam: General: NAD  Head: Normocephalic, atraumatic.    Eyes: Anicteric  Lungs:  coarse breath sounds bilateral, HFNC  Heart: Regular rate and rhythm  Abdomen:   Soft, nontender  Extremities: 1+ peripheral edema.  Neurologic: Alert and oriented  Skin: No lesions  Access: No hemodialysis access    Basic Metabolic Panel: Recent Labs  Lab 06/20/22 0444 06/21/22 0500 06/21/22 0530 06/22/22 0455 06/23/22 0408 06/24/22 0358  NA 155*  --  158* 151* 150* 143  K 3.5  --  4.0 3.3* 3.6 3.3*  CL 106  --  112* 108 108 102  CO2 39*  --  37* 33* 30 29  GLUCOSE 129*  --  111* 129* 110* 96  BUN 109*  --  87* 94* 88* 78*  CREATININE 1.48*  --  1.83* 2.48* 2.58* 2.62*  CALCIUM 9.3  --  9.1 9.1 9.0 8.7*  MG 2.2 2.3  --  2.3 2.2 1.8  PHOS 4.4  --  3.9 3.8 4.1 4.5     Liver Function Tests: Recent Labs  Lab 06/18/22 0423 06/19/22 0343 06/20/22 0444 06/21/22 0530  ALBUMIN 2.6* 2.5* 2.7* 2.6*    No results for input(s): "LIPASE", "AMYLASE" in the last 168 hours. No results for input(s): "AMMONIA" in the last 168 hours.   CBC: Recent Labs  Lab 06/20/22 0444 06/21/22 0500 06/22/22 0455 06/23/22 0408 06/24/22 0358  WBC 12.5* 12.2* 11.8* 10.4 9.8  NEUTROABS  --   --  8.8* 7.9* 7.3  HGB 8.4* 8.6* 8.3* 8.5* 8.0*  HCT 30.1* 30.6* 29.6* 29.9* 28.0*  MCV 86.5 86.7 85.8 84.9 84.6  PLT 191 219 207 237 217     Cardiac Enzymes: No results for input(s): "CKTOTAL", "CKMB", "CKMBINDEX", "TROPONINI"  in the last 168 hours.  BNP: Invalid input(s): "POCBNP"  CBG: Recent Labs  Lab 06/23/22 1603 06/23/22 1938 06/23/22 2310 06/24/22 0323 06/24/22 0724  GLUCAP 96 96 162* 120* 116*     Microbiology: Results for orders placed or performed during the hospital encounter of 06/08/22  Blood Culture (routine x 2)     Status: None   Collection Time: 06/08/22  7:18 PM   Specimen: BLOOD  Result Value Ref Range Status   Specimen Description BLOOD BLOOD LEFT ARM  Final   Special Requests   Final    BOTTLES DRAWN AEROBIC AND ANAEROBIC Blood Culture adequate volume   Culture   Final    NO GROWTH 5 DAYS Performed at [facility], [address], [place], [place] [address]    Report Status 06/13/2022 FINAL  Final  Blood Culture (routine x 2)     Status: None   Collection Time: 06/08/22  7:18 PM   Specimen: BLOOD  Result Value Ref Range Status   Specimen Description BLOOD BLOOD RIGHT ARM  Final   Special Requests   Final    BOTTLES DRAWN AEROBIC AND ANAEROBIC Blood Culture adequate volume   Culture   Final    NO GROWTH 5 DAYS Performed at [facility], [address] [place], [place] [address]    Report Status 06/13/2022 FINAL  Final  Resp panel by RT-PCR (RSV, Flu A&B, Covid) Anterior Nasal Swab     Status: None   Collection Time: 06/08/22  7:18 PM   Specimen: Anterior Nasal Swab  Result Value Ref Range Status   SARS Coronavirus 2 by RT PCR NEGATIVE NEGATIVE Final    Comment: (NOTE) SARS-CoV-2 target nucleic acids are NOT DETECTED.  The SARS-CoV-2 RNA is generally detectable in upper respiratory specimens during the acute phase of infection. The lowest concentration of SARS-CoV-2 viral copies this assay can detect is 138 copies/mL. A negative result does not preclude SARS-Cov-2 infection and should not be used as the sole basis for treatment or other patient management decisions. A negative result may occur with  improper specimen collection/handling, submission of specimen other than nasopharyngeal swab, presence of viral mutation(s) within the areas targeted by this assay, and inadequate number of viral copies(<138 copies/mL). A negative result must be combined with clinical observations, patient history, and epidemiological information. The expected result is Negative.  Fact Sheet for Patients:  EntrepreneurPulse.com.au  Fact Sheet for Healthcare Providers:  IncredibleEmployment.be  This test is no t yet approved or cleared by the [place] FDA and  has been authorized for detection and/or diagnosis of SARS-CoV-2 by FDA under an Emergency Use  Authorization (EUA). This EUA will remain  in effect (meaning this test can be used) for the duration of the COVID-19 declaration under Section 564(b)(1) of the Act, 21 U.S.C.section 360bbb-3(b)(1), unless the authorization is terminated  or revoked sooner.       Influenza A by PCR NEGATIVE NEGATIVE Final   Influenza B by PCR NEGATIVE NEGATIVE Final    Comment: (NOTE) The Xpert Xpress SARS-CoV-2/FLU/RSV plus assay is intended as an aid in the diagnosis of influenza from Nasopharyngeal swab specimens and should not be used as a sole basis for treatment. Nasal washings and aspirates are unacceptable for Xpert Xpress SARS-CoV-2/FLU/RSV testing.  Fact Sheet for Patients: EntrepreneurPulse.com.au  Fact Sheet for Healthcare Providers: IncredibleEmployment.be  This test is not yet approved or cleared by the [place] FDA and has been authorized for detection and/or diagnosis of  SARS-CoV-2 by FDA under an Emergency Use Authorization (EUA). This EUA will remain in effect (meaning this test can be used) for the duration of the COVID-19 declaration under Section 564(b)(1) of the Act, 21 U.S.C. section 360bbb-3(b)(1), unless the authorization is terminated or revoked.     Resp Syncytial Virus by PCR NEGATIVE NEGATIVE Final    Comment: (NOTE) Fact Sheet for Patients: EntrepreneurPulse.com.au  Fact Sheet for Healthcare Providers: IncredibleEmployment.be  This test is not yet approved or cleared by the [place] FDA and has been authorized for detection and/or diagnosis of SARS-CoV-2 by FDA under an Emergency Use Authorization (EUA). This EUA will remain in effect (meaning this test can be used) for the duration of the COVID-19 declaration under Section 564(b)(1) of the Act, 21 U.S.C. section 360bbb-3(b)(1), unless the authorization is terminated or revoked.  Performed at [facility], [address] [place], [place] [address]   Urine Culture     Status: None   Collection Time: 06/08/22  9:34 PM   Specimen: In/Out Cath Urine  Result Value Ref Range Status   Specimen Description   Final    IN/OUT CATH URINE Performed at [facility], [address], [place], [place] [address]    Special Requests   Final    NONE Performed at [facility], [address], [place], [place] [address]    Culture   Final    NO GROWTH Performed at [facility], [place] [address], [place],  [address]    Report Status 06/10/2022 FINAL  Final  MRSA Next Gen by PCR, Nasal     Status: None   Collection Time: 06/10/22 11:49 PM   Specimen: Nasal Mucosa; Nasal Swab  Result Value Ref Range Status   MRSA by PCR Next Gen NOT DETECTED NOT DETECTED Final    Comment: (NOTE) The GeneXpert MRSA Assay (FDA approved for NASAL specimens only), is one component of a comprehensive MRSA colonization surveillance program. It is not intended to diagnose MRSA infection nor to guide or monitor treatment for MRSA infections. Test performance is not FDA approved in patients less than 25 years old. Performed at [facility], [place],  [address]   Respiratory (~20 pathogens) panel by PCR     Status: None   Collection Time: 06/12/22  6:15 AM   Specimen: Nasopharyngeal Swab; Respiratory  Result Value Ref Range Status   Adenovirus NOT DETECTED NOT DETECTED Final   Coronavirus 229E NOT DETECTED NOT DETECTED Final    Comment: (NOTE) The Coronavirus on the Respiratory Panel, DOES NOT test for the novel  Coronavirus (2019 nCoV)    Coronavirus HKU1 NOT DETECTED NOT DETECTED Final   Coronavirus NL63 NOT DETECTED NOT DETECTED Final   Coronavirus OC43 NOT DETECTED NOT DETECTED Final   Metapneumovirus NOT DETECTED NOT DETECTED Final   Rhinovirus / Enterovirus NOT DETECTED NOT DETECTED Final   Influenza A NOT DETECTED NOT DETECTED Final   Influenza B NOT DETECTED  NOT DETECTED Final   Parainfluenza Virus 1 NOT DETECTED NOT DETECTED Final   Parainfluenza Virus 2 NOT DETECTED NOT DETECTED Final   Parainfluenza Virus 3 NOT DETECTED NOT DETECTED Final   Parainfluenza Virus 4 NOT DETECTED NOT DETECTED Final   Respiratory Syncytial Virus NOT DETECTED NOT DETECTED Final   Bordetella pertussis NOT DETECTED NOT DETECTED Final   Bordetella Parapertussis NOT DETECTED NOT DETECTED Final   Chlamydophila pneumoniae NOT DETECTED NOT DETECTED Final   Mycoplasma pneumoniae NOT  DETECTED NOT DETECTED Final    Comment: Performed at [facility], [place] [address] [place], [place] [address]  Culture, Respiratory w Gram Stain     Status: None   Collection Time: 06/17/22  8:15 AM   Specimen: Tracheal Aspirate; Respiratory  Result Value Ref Range Status   Specimen Description   Final    TRACHEAL ASPIRATE Performed at [facility], [address] [place], [place] [address]    Special Requests   Final    NONE Performed at [facility], [address] [place], [place] [address]    Gram Stain   Final    RARE WBC PRESENT, PREDOMINANTLY PMN RARE SQUAMOUS EPITHELIAL CELLS PRESENT FEW YEAST Performed at [facility], [place] [address], [place], [place] [address]    Culture FEW CANDIDA ALBICANS  Final   Report Status 06/19/2022 FINAL  Final    Coagulation Studies: No results for input(s): "LABPROT", "INR" in the last 72 hours.  Urinalysis: No results for input(s): "COLORURINE", "LABSPEC", "PHURINE", "GLUCOSEU", "HGBUR", "BILIRUBINUR", "KETONESUR", "PROTEINUR", "UROBILINOGEN", "NITRITE", "LEUKOCYTESUR" in the last 72 hours.  Invalid input(s): "APPERANCEUR"    Imaging: No results found.   Medications:    potassium chloride 10 mEq (06/24/22 1010)    budesonide (PULMICORT) nebulizer solution  0.5 mg Nebulization BID   Chlorhexidine Gluconate Cloth  6 each Topical Daily   fluocinonide-emollient   Topical QODAY   Gerhardt's butt  cream   Topical TID   heparin  5,000 Units Subcutaneous Q8H   insulin aspart  0-20 Units Subcutaneous Q4H   ipratropium-albuterol  3 mL Nebulization BID   pantoprazole (PROTONIX) IV  40 mg Intravenous Q24H   docusate sodium, ipratropium-albuterol, lip balm, ondansetron **OR** ondansetron (ZOFRAN) IV, mouth rinse, mouth rinse, phenol  Assessment/ Plan:  Ms. Samantha Mason is a 72 y.o.  female  with past medical history including COPD with renal failure requiring 2 L oxygen as needed, chronic diastolic heart failure, sleep apnea, morbid obesity, hypertension, and chronic kidney disease stage IV. Patient presents to the emergency department feeling sick and was admitted for Sepsis [facility]) [A41.9] Cellulitis, unspecified cellulitis site [L03.90] Community acquired pneumonia, unspecified laterality [J18.9] Sepsis, due to unspecified organism, unspecified whether acute organ dysfunction present [facility]) [A41.9]   Acute Kidney Injury on chronic kidney disease stage IIIb with baseline creatinine 1.55 and GFR of 36 on 04/17/22.  Acute kidney injury secondary to severe infection. No IV contrast exposure.  Diuretics remain held. Lower extremity edema present, however patient has history of lymphedema.   Creatinine remains stable. No acute need for dialysis. Will continue to monitor renal function.    Lab Results  Component Value Date   CREATININE 2.62 (H) 06/24/2022   CREATININE 2.58 (H) 06/23/2022   CREATININE 2.48 (H) 06/22/2022    Intake/Output Summary (Last 24 hours) at 06/24/2022 1053 Last data filed at 06/24/2022 1000 Gross per 24 hour  Intake 1346.55 ml  Output 335 ml  Net 1011.55 ml    2. Anemia of chronic kidney disease Lab Results  Component Value Date   HGB 8.0 (L) 06/24/2022    Hemoglobin continues to decrease. Iron studies acceptable.   3.  Chronic diastolic heart failure.  Echo from 07/16/21 shows EF 55 to 60% with mild LVH.  Diuretics remain held  4.  Acute respiratory  failure.  Weaned to 2L [place], continue supportive care   LOS: [place] 2/5/202410:53 AM

## 2022-06-24 NOTE — Progress Notes (Signed)
Pt is Aox4 and asking for non thickened sweet tea, Pt understands the risks of drinking non thickened liquids, per MD it is ok for me to order her a sweet tea.

## 2022-06-24 NOTE — Progress Notes (Signed)
Had a 5 second run of SVT, then converted to Sinus Tach at 112, with no intervention. Notified Rufina Falco NP.

## 2022-06-24 NOTE — Progress Notes (Signed)
  PROGRESS NOTE    Samantha Mason  GEZ:[id number] DOB: 06-21-1950 DOA: 06/08/2022 PCP: Glean Hess, MD  120A/120A-AA  LOS: 16 days   Brief hospital course:   Assessment & Plan: Samantha Mason is a 72 y.o. female with medical history significant for Morbid obesity, chronic HFpEF,CKD stage III, chronic lower extremity lymphedema on treatment with torsemide, hypertension, asthma, stroke (1995) and obstructive sleep apnea(non compliant with cpap) who presented to [facility] ED on 06/08/22 due to shortness of breath and generalized malaise for appropriately 1 week.   Pt was admitted with Sepsis in setting of lower extremity Cellulitis, AKI, and Acute on Chronic Hypercapnic Respiratory Failure in setting AECOPD. Course complicated by CO2 narcosis, failed BiPAP requiring intubation and mechanical ventilation.  Extubated on 1/31.  Transferred to hospitalist service on 06/24/22.  #Toxic Metabolic Encephalopathy Mental status improved with improvement in her CO2 levels and resolution of CO2 narcosis and metabolic derangements. EEG negative for seizures, CT head normal.   #Acute on Chronic HFpEF --TTE with diastolic dysfunction and signs of restriction, and she has been diuresed with good effect, and is now contracted.  -Holding diuresis given contraction, hypernatremia, and renal failure.   #Acute on Chronic Hypoxic and Hypercapnic Respiratory Failure Hypercapnic respiratory failure secondary to a combination of HFpEF and OHS. Initially intubated and extubated on 06/19/2022. Her respiratory failure was treated with BiPAP, diuresis, and bronchodilators. She is refusing BiPAP now. Currently on 2L [place]. --Continue supplemental O2 to keep sats >=90%, wean as tolerated -continue supportive care with bronchodilators -palliative care involved -DNR/DNI   #Acute on Chronic Kidney Injury #Hypernatremia, resolved #Contraction Alkalosis --nephrology consulted --holding diuretics --d/c D5W    #Anemia of  Chronic Disease   # Hypokalemia --monitor and replete PRN   DVT prophylaxis: Heparin SQ Code Status: DNR  Family Communication:  Level of care: Med-Surg Dispo:   The patient is from: home Anticipated d/c is to: to be determined Anticipated d/c date is: to be determined   Subjective and Interval History:  Pt reported no issue drinking sweet tea (unthickened).     Objective: Vitals:   06/24/22 1100 06/24/22 1200 06/24/22 1300 06/24/22 1548  BP: (!) 121/58   (!) 136/52  Pulse: 94 85 86 85  Resp: (!) 23 (!) '21 20 18  '$ Temp:    97.7 F (36.5 C)  TempSrc:      SpO2: 95% 99% 99% 100%  Weight:      Height:        Intake/Output Summary (Last 24 hours) at 06/24/2022 1851 Last data filed at 06/24/2022 1300 Gross per 24 hour  Intake 956.48 ml  Output 35 ml  Net 921.48 ml   Filed Weights   06/21/22 0500 06/22/22 0514 06/23/22 0600  Weight: (!) 140 kg (!) 141.4 kg (!) 143.7 kg    Examination:   Constitutional: NAD, AAOx3 HEENT: conjunctivae and lids normal, EOMI CV: No cyanosis.   RESP: normal respiratory effort, on 2L Neuro: II - XII grossly intact.   Psych: Normal mood and affect.  Appropriate judgement and reason Rectal tube present   Data Reviewed: I have personally reviewed labs and imaging studies  Time spent: 50 minutes  Enzo Bi, MD [facility] If 7PM-7AM, please contact night-coverage 06/24/2022, 6:51 PM

## 2022-06-24 NOTE — Progress Notes (Signed)
Nutrition Follow-up  DOCUMENTATION CODES:   Morbid obesity  INTERVENTION:   -MVI with minerals daily -Ensure Enlive po TID, each supplement provides 350 kcal and 20 grams of protein  NUTRITION DIAGNOSIS:   Inadequate oral intake related to inability to eat (pt sedated and ventilated) as evidenced by NPO status.  Progressing advanced to PO diet and 06/23/22  GOAL:   Patient will meet greater than or equal to 90% of their needs  Progressing   MONITOR:   Diet advancement, Labs, Weight trends, Skin, I & O's  REASON FOR ASSESSMENT:   Consult Enteral/tube feeding initiation and management  ASSESSMENT:   72 y/o female with h/o morbid obesity, OSA, HTN, COPD, CHF, CKD IV, GERD, IBS, RLS and recent admission for SBO (medically managed) who is admitted with CHF, sepsis, UTI, AKI and cellulitis.  1/31- extubated 2/1- s/p BSE- ice chips for comfort 2/4- s/p BSE- advanced to dysphagia 1 diet with nectar thick liquids  Reviewed I/O's: +922 ml x 24 hours and -8.4 L since 06/10/22  UOP: 300 ml x 24 hours   Per MD notes, pt is DNR/DNI and does not want to be placed on ventilator support again.   Pt sleeping soundly at time of visit. No family present. Pt has indicated that she does not want an NGT placed.   Case discussed with SLP; pt underwent another BSE today and has been advanced to a dysphagia 3 diet with thin liquids.   Medications reviewed and include potassium chloride.   Labs reviewed: K: 3.3 (on IV supplementation), CBGS: 116-162 (inpatient orders for glycemic control are 0-20 units insulin aspart every 4 hours).    Diet Order:   Diet Order             DIET DYS 3 Room service appropriate? Yes; Fluid consistency: Thin  Diet effective now                   EDUCATION NEEDS:   No education needs have been identified at this time  Skin:  Skin Assessment: Reviewed RN Assessment  Last BM:  06/23/22 (35 ml via rectal tube)  Height:   Ht Readings from Last 1  Encounters:  06/09/22 '5\' 4"'$  (1.626 m)    Weight:   Wt Readings from Last 1 Encounters:  06/23/22 (!) 143.7 kg    Ideal Body Weight:  54.5 kg  BMI:  Body mass index is 54.38 kg/m.  Estimated Nutritional Needs:   Kcal:  1900-2100  Protein:  110-125 grams  Fluid:  > 1.9 L    Loistine Chance, RD, LDN, [place] Registered Dietitian II Certified Diabetes Care and Education Specialist Please refer to [facility] for RD and/or RD on-call/weekend/after hours pager

## 2022-06-24 NOTE — Progress Notes (Signed)
Tried calling report RN stated she was in the middle of something for me to call her back.

## 2022-06-24 NOTE — Consult Note (Signed)
[place] for Electrolyte Monitoring and Replacement   Recent Labs: Potassium (mmol/L)  Date Value  06/24/2022 3.3 (L)  05/25/2014 3.6   Magnesium (mg/dL)  Date Value  06/24/2022 1.8   Calcium (mg/dL)  Date Value  06/24/2022 8.7 (L)   Calcium, Total (mg/dL)  Date Value  05/25/2014 8.6   Albumin (g/dL)  Date Value  06/21/2022 2.6 (L)  05/25/2014 3.2 (L)   Phosphorus (mg/dL)  Date Value  06/24/2022 4.5   Sodium (mmol/L)  Date Value  06/24/2022 143  02/13/2022 135  05/25/2014 141    Assessment: 72 y.o. female with PMHx significant for HFpEF, COPD, OSA (noncompliant with CPAP), CKD Stage III who is  admitted with Sepsis in setting of lower extremity Cellulitis, AKI, and  Acute on Chronic Hypercapnic Respiratory Failure in setting AECOPD. Pharmacy has been consulted to monitor and replace electrolytes while PCCM care.  Goal of Therapy:  Electrolytes WNL  Plan:  2 grams IV magnesium sulfate x 1 10 mEq IV KCl x 3 Because this consult was generated as part of an ICU order set and patient is transferring pharmacy will sign off for now Please feel free to reach out if any further assistance is needed  Dallie Piles, PharmD Clinical Pharmacist 06/24/2022 7:06 AM

## 2022-06-24 NOTE — TOC Progression Note (Signed)
Transition of Care [facility]) - Progression Note    Patient Details  Name: Samantha Mason MRN: [id number] Date of Birth: 30-Jul-1950  [facility] CM/SW Contact  Shelbie Hutching, RN Phone Number: 06/24/2022, 3:08 PM  Clinical Narrative:    Therapy recommends Short term rehab, RNCM met with patient at the bedside and she agrees with short term rehab.  She says that she prefers [facility] in [place].  RNCM will begin SNF workup.    Expected Discharge Plan: [place] Barriers to Discharge: Continued Medical Work up  Expected Discharge Plan and Services   Discharge Planning Services: CM Consult Post Acute Care Choice: [place] Living arrangements for the past 2 months: Single Family Home                 DME Arranged: N/A         HH Arranged: NA           Social Determinants of Health (SDOH) Interventions SDOH Screenings   Food Insecurity: No Food Insecurity (06/09/2022)  Housing: Low Risk  (06/09/2022)  Transportation Needs: No Transportation Needs (06/09/2022)  Utilities: Not At Risk (06/09/2022)  Alcohol Screen: Low Risk  (08/08/2021)  Depression (PHQ2-9): Medium Risk (02/25/2022)  Financial Resource Strain: Medium Risk (08/14/2021)  Physical Activity: Inactive (08/08/2021)  Social Connections: Socially Isolated (08/08/2021)  Stress: No Stress Concern Present (08/08/2021)  Tobacco Use: Low Risk  (06/11/2022)    Readmission Risk Interventions     No data to display

## 2022-06-24 NOTE — Progress Notes (Signed)
Palliative Care Progress Note, Assessment & Plan   Patient Name: JOELEE Mason       Date: 06/24/2022 DOB: 11/02/1950  Age: 72 y.o. MRN#: [id number] Attending Physician: Enzo Bi, MD Primary Care Physician: Glean Hess, MD Admit Date: 06/08/2022  Subjective: Patient is sitting up in bed.  She acknowledges my presence and is able to make her wishes known.  She is requesting ice cream or pudding.  No family or friends present at bedside.  HPI: 72 y.o. female  with past medical history of morbid obesity, chronic HFpEF, CKD (stage III), chronic LE lymphedema (treatment of torsemide), HTN, asthma, stroke (1995), and OSA (noncompliant with CPAP) admitted on 06/08/2022 with feeling unwell for approximately 1 week, chills, generalized weakness, and fatigue.   Patient is being treated for sepsis in setting of lower extremity cellulitis, AKI, and acute on chronic hypercapnic respiratory failure in the setting of AECOPD.   1/23, patient was transferred to ICU due to CO2 narcosis and placed on BiPAP.  Patient is having worsening AKI as well as agitation requiring Precedex.  Patient is at high risk for intubation.  PMT was consulted to discuss goals of care. 1/25 Intubated 1/31 extubated and DNR/DNI confirmed 2/1 failed SLP and declining NG tube  Summary of counseling/coordination of care: After reviewing the patient's chart and assessing the patient at bedside, I spoke with patient in regards to plan and goals of care.  Patient shares that she would like to avoid an artificial feeding tube at all cost.  However, she is willing to accept it if that is her "last resort".  We discussed importance of nutrition and food as fuel and not fun.  Patient continues to request pudding and ice cream.  Discussed new diet of  dysphagia 3 and thin liquids.  Reviewed importance of safe chewing and swallowing to avoid aspiration.  Patient confirmed understanding and says she is ready to try it so she can avoid a feeding tube.  Patient's functional status discussed.  She says she is willing and ready to work with PT in order to be able to sit up and build up her stamina.  Ongoing support from PMT will be provided for patient throughout her hospitalization.  Physical Exam Constitutional:      General: She is not in acute distress.    Appearance: She is obese. She is not ill-appearing.  HENT:     Mouth/Throat:     Mouth: Mucous membranes are moist.  Eyes:     Pupils: Pupils are equal, round, and reactive to light.  Cardiovascular:     Rate and Rhythm: Normal rate.     Pulses: Normal pulses.  Pulmonary:     Effort: Pulmonary effort is normal.  Abdominal:     Palpations: Abdomen is soft.  Musculoskeletal:     Comments: Generalized weakness  Skin:    General: Skin is warm and dry.     Comments: Dressings to bilateral LEs clean dry and intact  Neurological:     Mental Status: She is alert and oriented to person, place, and time.  Psychiatric:        Mood and Affect: Mood normal.  Behavior: Behavior normal.        Thought Content: Thought content normal.        Judgment: Judgment normal.             Total Time 25 minutes   Shonita Rinck L. Ilsa Iha, FNP-BC Palliative Medicine Team Team Phone # [phone]

## 2022-06-24 NOTE — NC FL2 (Signed)
[place] LEVEL OF CARE FORM     IDENTIFICATION  Patient Name: Samantha Mason Birthdate: 08-Oct-1950 Sex: female Admission Date (Current Location): 06/08/2022  [facility] and [place] Number:  Engineering geologist and Address:  [facility], [address] [place], [place] [address]      Provider Number: [id number]  Attending Physician Name and Address:  Enzo Bi, MD  Relative Name and Phone Number:  Casondra Gasca- [phone]    Current Level of Care: Hospital Recommended Level of Care: [place] Prior Approval Number:    Date Approved/Denied:   PASRR Number: [id number]  Discharge Plan: SNF    Current Diagnoses: Patient Active Problem List   Diagnosis Date Noted   Community acquired pneumonia 06/21/2022   Cellulitis 49/35/5217   Acute metabolic encephalopathy 47/15/9539   Severe sepsis ([place]) 06/08/2022   AMS (altered mental status) 04/30/2022   Small bowel obstruction ([place]) 04/26/2022   Hypokalemia 01/26/2022   SOB (shortness of breath)    Extreme obesity with alveolar hypoventilation (HCC)    CKD (chronic kidney disease) stage 4, GFR 15-29 ml/min ([place]) 01/23/2022   Chronic respiratory failure with hypoxia and hypercapnia ([place]) 10/15/2021   Acute on chronic heart failure with preserved ejection fraction (HFpEF) ([place]) 08/27/2021   Hyponatremia 07/26/2021   Insomnia 07/22/2021   Acute renal failure superimposed on stage 3b chronic kidney disease ([place]) 07/17/2021   Acute on chronic respiratory failure with hypoxia and hypercapnia (HCC) 07/15/2021   Chronic asthmatic bronchitis 07/15/2021   Morbid obesity ([place]) 07/15/2021   Myocardial injury 07/15/2021   Chronic diastolic CHF (congestive heart failure) ([place])    History of stroke 11/17/2020   Ovarian failure 02/21/2020   Primary osteoarthritis of right knee 02/13/2019   S/P total knee arthroplasty, left 02/13/2019   Aortic atherosclerosis ([place])  08/01/2018   Obstructive lung disease (generalized) ([place]) 07/24/2018   B12 deficiency anemia 07/07/2018   Benign neoplasm of ascending colon    Severe persistent asthma 02/13/2018   Vitamin D deficiency 03/17/2017   Prediabetes 03/14/2017   Microcytic anemia 03/14/2017   Irritable bowel syndrome with diarrhea 03/12/2017   Allergic rhinitis 03/12/2017   Restless leg syndrome 03/12/2017   GERD (gastroesophageal reflux disease) 10/16/2016   Essential hypertension 10/16/2016   Morbid obesity with BMI of 50.0-59.9, adult ([place]) 12/08/2014   OSA  12/08/2014   Shortness of breath 04/07/2014    Orientation RESPIRATION BLADDER Height & Weight     Self, Time, Situation, Place  Normal Incontinent Weight: (!) 143.7 kg Height:  '5\' 4"'$  (162.6 cm)  BEHAVIORAL SYMPTOMS/MOOD NEUROLOGICAL BOWEL NUTRITION STATUS      Incontinent Diet (see discharge summary)  AMBULATORY STATUS COMMUNICATION OF NEEDS Skin   Extensive Assist Verbally Normal                       Personal Care Assistance Level of Assistance  Bathing, Feeding, Dressing Bathing Assistance: Maximum assistance Feeding assistance: Limited assistance Dressing Assistance: Maximum assistance     Functional Limitations Info  Sight, Hearing, Speech Sight Info: Adequate Hearing Info: Adequate Speech Info: Adequate    SPECIAL CARE FACTORS FREQUENCY  PT (By licensed PT), OT (By licensed OT)     PT Frequency: 5 times per week OT Frequency: 5 times per week            Contractures Contractures Info: Not present    Additional Factors Info  Code Status, Allergies Code Status Info: DNR Allergies Info:  Nuvigil, PCN, spironolactone, gabapentin, melatonin, provigil, entresto, lisinopril           Current Medications (06/24/2022):  This is the current hospital active medication list Current Facility-Administered Medications  Medication Dose Route Frequency Provider Last Rate Last Admin   budesonide (PULMICORT) nebulizer  solution 0.5 mg  0.5 mg Nebulization BID Darel Hong D, NP   0.5 mg at 06/24/22 6578   Chlorhexidine Gluconate Cloth 2 % PADS 6 each  6 each Topical Daily Max Sane, MD   6 each at 06/24/22 1147   docusate sodium (COLACE) capsule 100 mg  100 mg Oral BID PRN Rust-Chester, Huel Cote, NP       feeding supplement (ENSURE ENLIVE / ENSURE PLUS) liquid 237 mL  237 mL Oral TID BM Enzo Bi, MD   237 mL at 06/24/22 1345   fluocinonide-emollient (LIDEX-E) 0.05 % cream   Topical Joetta Manners, MD   Given at 06/24/22 1148   Gerhardt's butt cream   Topical TID Lang Snow, NP   Given at 06/24/22 0849   heparin injection 5,000 Units  5,000 Units Subcutaneous Q8H Acheampong, Warnell Bureau, MD   5,000 Units at 06/24/22 1344   insulin aspart (novoLOG) injection 0-20 Units  0-20 Units Subcutaneous Q4H Lang Snow, NP   4 Units at 06/23/22 2318   ipratropium-albuterol (DUONEB) 0.5-2.5 (3) MG/3ML nebulizer solution 3 mL  3 mL Nebulization Q4H PRN Acheampong, Warnell Bureau, MD   3 mL at 06/18/22 1303   ipratropium-albuterol (DUONEB) 0.5-2.5 (3) MG/3ML nebulizer solution 3 mL  3 mL Nebulization BID Enzo Bi, MD       lip balm (BLISTEX) ointment   Topical PRN Lang Snow, NP       multivitamin with minerals tablet 1 tablet  1 tablet Oral Daily Enzo Bi, MD       ondansetron [facility]) tablet 4 mg  4 mg Oral Q6H PRN Acheampong, Warnell Bureau, MD       Or   ondansetron (ZOFRAN) injection 4 mg  4 mg Intravenous Q6H PRN Acheampong, Warnell Bureau, MD       Oral care mouth rinse  15 mL Mouth Rinse PRN Flora Lipps, MD       Oral care mouth rinse  15 mL Mouth Rinse PRN Armando Reichert, MD       pantoprazole (PROTONIX) injection 40 mg  40 mg Intravenous Q24H Darel Hong D, NP   40 mg at 06/24/22 0844   phenol (CHLORASEPTIC) mouth spray 1 spray  1 spray Mouth/Throat PRN Wynelle Cleveland, RPH   1 spray at 06/19/22 2133     Discharge Medications: Please see discharge summary for a list of discharge  medications.  Relevant Imaging Results:  Relevant Lab Results:   Additional Information SS# [id number]  Shelbie Hutching, RN

## 2022-06-24 NOTE — Progress Notes (Addendum)
Speech Language Pathology Treatment: Dysphagia  Patient Details Name: Samantha Mason MRN: [id number] DOB: 1951-02-07 Today's Date: 06/24/2022 Time: 0940-1000 SLP Time Calculation (min) (ACUTE ONLY): 20 min  Assessment / Plan / Recommendation Clinical Impression  Pt seen today for dysphagia treatment. Pt alert and cooperative throughout session. RN reported pt expressed dislike of pureed diet, resulting in poor PO intake. RN stated that pt requesting sweet tea and accepting risks of aspiration of sweet tea. Pt drinking sweet tea in bed upon SLP arrival. Pt left sitting up in bed w/ call button in reach and bed alarm set.  Pt on 2L O2 via [place]; afebrile; WBC WNL.  Pt did not appear to present w/ any overt s/s of aspiration/aspiration pneumonia. Pt exhibited minimal oral phase deficits c/b slightly prolonged mastication/bolus formation likely d/t generalized weakness/deconditioning.  Administered trials of thin liquid (~5-6 oz) and solids (half cracker). During oral phase, pt exhibited adequate mastication, good bolus control/management, and timely A-P transit. Noted mildly prolonged mastication/bolus formation and trace amount of residue in oral cavity after solid Given min prompts, pt cleared oral cavity w/ liquid. Did not note any overt s/s of aspiration such as coughing, immediate/consistent throat clearing, or wet vocal quality post swallow.  Pt educated on GENERAL aspiration precautions, compensatory swallowing strategies (following solids w/ liquids to clear oral cavity), and SLP POC. Pt agreed and appreciative.  Recommend Dys 3 Diet w/ thin liquids - monitor straw use. Recommend meals w/ general aspiration precautions (sitting upright, small sips/bites, breaks between sips/bites). RN/pt updated. SLP will continue to monitor diet for tolerance in next 1-3 days.    HPI HPI: Per H&P, "pt is a 72 y.o. female with medical history significant for Morbid obesity, chronic HFpEF,CKD stage III,  chronic lower extremity lymphedema on treatment with torsemide, hypertension, asthma, stroke (1995) and obstructive sleep apnea(non compliant with cpap).  She presented to the emergency room accompanied by son with complaints of feeling unwell for the past 1 week.  She has been having chills but denied any fever.  She has also generally felt weak and fatigued.  She reports bilateral lower extremity swelling with associated redness.  Redness is more on the right eye compared to the left.  She had reported same to cardiology during the week and was advised to increase dose of Demadex to 60 mg.  She felt discomfort in her lower legs.  She also reports increasing weight gain over the same period.  She is having to use oxygen at all times during the day and also at night.  She admits to PND and orthopnea.  EMS was called to her home and was reported to have fever of 105F. In the ED, vitals were noted to be stable with some evidence of tachycardia. Initial temperature reported to be 105.  This is trended down to 98.6 at the time of my evaluation. Patient was initiated on sepsis protocol with IV fluids and antibiotics. Workup in the ED including CT abdomen and pelvis were done. Significant findings include an skin thickening and soft tissue edema of the proximal right thigh.  No obvious abscess when noted.  Large bilateral inguinal and iliac lymph nodes were noted.  Diffuse bronchial wall thickening was also noted." Noted pt intubated on 06/11/22 and extubated 06/19/22.      SLP Plan  Continue with current plan of care      Recommendations for follow up therapy are one component of a multi-disciplinary discharge planning process, led by the attending physician.  Recommendations may be updated based on patient status, additional functional criteria and insurance authorization.    Recommendations  Diet recommendations: Thin liquid;Dysphagia 3 (mechanical soft) Liquids provided via: Cup;Straw Medication  Administration: Crushed with puree Supervision: Staff to assist with self feeding;Patient able to self feed;Intermittent supervision to cue for compensatory strategies Compensations: Minimize environmental distractions;Slow rate;Small sips/bites Postural Changes and/or Swallow Maneuvers: Seated upright 90 degrees;Upright 30-60 min after meal                Oral Care Recommendations: Oral care QID;Staff/trained caregiver to provide oral care Follow Up Recommendations: Follow physician's recommendations for discharge plan and follow up therapies Assistance recommended at discharge: Frequent or constant Supervision/Assistance SLP Visit Diagnosis: Dysphagia, pharyngeal phase (R13.13) Plan: Continue with current plan of care          [place], Speech Pathology   Randall Hiss  06/24/2022, 1:09 PM

## 2022-06-25 DIAGNOSIS — L0390 Cellulitis, unspecified: Secondary | ICD-10-CM | POA: Diagnosis not present

## 2022-06-25 DIAGNOSIS — R6520 Severe sepsis without septic shock: Secondary | ICD-10-CM | POA: Diagnosis not present

## 2022-06-25 DIAGNOSIS — A419 Sepsis, unspecified organism: Secondary | ICD-10-CM | POA: Diagnosis not present

## 2022-06-25 DIAGNOSIS — I5032 Chronic diastolic (congestive) heart failure: Secondary | ICD-10-CM | POA: Diagnosis not present

## 2022-06-25 DIAGNOSIS — J189 Pneumonia, unspecified organism: Secondary | ICD-10-CM | POA: Diagnosis not present

## 2022-06-25 LAB — BASIC METABOLIC PANEL
Anion gap: 12 (ref 5–15)
BUN: 78 mg/dL — ABNORMAL HIGH (ref 8–23)
CO2: 28 mmol/L (ref 22–32)
Calcium: 8.8 mg/dL — ABNORMAL LOW (ref 8.9–10.3)
Chloride: 103 mmol/L (ref 98–111)
Creatinine, Ser: 3.12 mg/dL — ABNORMAL HIGH (ref 0.44–1.00)
GFR, Estimated: 15 mL/min — ABNORMAL LOW (ref >60)
Glucose, Bld: 98 mg/dL (ref 70–99)
Potassium: 3.6 mmol/L (ref 3.5–5.1)
Sodium: 143 mmol/L (ref 135–145)

## 2022-06-25 LAB — CBC
HCT: 26.5 % — ABNORMAL LOW (ref 36.0–46.0)
Hemoglobin: 7.7 g/dL — ABNORMAL LOW (ref 12.0–15.0)
MCH: 24.2 pg — ABNORMAL LOW (ref 26.0–34.0)
MCHC: 29.1 g/dL — ABNORMAL LOW (ref 30.0–36.0)
MCV: 83.3 fL (ref 80.0–100.0)
Platelets: 211 10*3/uL (ref 150–400)
RBC: 3.18 MIL/uL — ABNORMAL LOW (ref 3.87–5.11)
RDW: 18.3 % — ABNORMAL HIGH (ref 11.5–15.5)
WBC: 7.6 10*3/uL (ref 4.0–10.5)
nRBC: 0 % (ref 0.0–0.2)

## 2022-06-25 LAB — MAGNESIUM: Magnesium: 2.6 mg/dL — ABNORMAL HIGH (ref 1.7–2.4)

## 2022-06-25 MED ORDER — PANTOPRAZOLE SODIUM 40 MG IV SOLR
40.0000 mg | INTRAVENOUS | Status: DC
  Administered 2022-06-25 – 2022-07-02 (×8): 40 mg via INTRAVENOUS
  Filled 2022-06-25 (×8): qty 10

## 2022-06-25 MED ORDER — DARBEPOETIN ALFA 60 MCG/0.3ML IJ SOSY
60.0000 ug | PREFILLED_SYRINGE | INTRAMUSCULAR | Status: DC
  Administered 2022-06-25 – 2022-07-02 (×2): 60 ug via SUBCUTANEOUS
  Filled 2022-06-25 (×4): qty 0.3

## 2022-06-25 MED ORDER — PANTOPRAZOLE SODIUM 40 MG PO TBEC: 40.0000 mg | DELAYED_RELEASE_TABLET | Freq: Every day | ORAL | Status: DC

## 2022-06-25 NOTE — Plan of Care (Signed)
  Problem: Fluid Volume: Goal: Hemodynamic stability will improve Outcome: Progressing   Problem: Clinical Measurements: Goal: Diagnostic test results will improve Outcome: Progressing   Problem: Respiratory: Goal: Ability to maintain adequate ventilation will improve Outcome: Progressing   Problem: Education: Goal: Knowledge of General Education information will improve Description: Including pain rating scale, medication(s)/side effects and non-pharmacologic comfort measures Outcome: Progressing   Problem: Clinical Measurements: Goal: Ability to maintain clinical measurements within normal limits will improve Outcome: Progressing   Problem: Activity: Goal: Risk for activity intolerance will decrease Outcome: Progressing   Problem: Nutrition: Goal: Adequate nutrition will be maintained Outcome: Progressing   Problem: Coping: Goal: Level of anxiety will decrease Outcome: Progressing   Problem: Safety: Goal: Ability to remain free from injury will improve Outcome: Progressing   Problem: Skin Integrity: Goal: Risk for impaired skin integrity will decrease Outcome: Progressing

## 2022-06-25 NOTE — Care Management Important Message (Signed)
Important Message  Patient Details  Name: JNYAH BRAZEE MRN: [id number] Date of Birth: 01/10/1951   Medicare Important Message Given:  Yes  Reviewed Medicare IM with patient via room phone [phone]).  Copy of Medicare IM sent securely to patient's MyChart for reference.    [name] 06/25/2022, 11:32 AM

## 2022-06-25 NOTE — Progress Notes (Signed)
Palliative Care Progress Note, Assessment & Plan   Patient Name: Samantha Mason       Date: 06/25/2022 DOB: 1951/03/19  Age: 72 y.o. MRN#: [id number] Attending Physician: Enzo Bi, MD Primary Care Physician: Glean Hess, MD Admit Date: 06/08/2022  Subjective: Patient is lying in bed, asleep and easily arousable.  She acknowledges my presence and is able to make her wishes known.  I attempted to offer her breakfast.  She declined at this time, saying she just wants to rest.  No family or friends present at bedside.  HPI: 72 y.o. female  with past medical history of morbid obesity, chronic HFpEF, CKD (stage III), chronic LE lymphedema (treatment of torsemide), HTN, asthma, stroke (1995), and OSA (noncompliant with CPAP) admitted on 06/08/2022 with feeling unwell for approximately 1 week, chills, generalized weakness, and fatigue.   Patient is being treated for sepsis in setting of lower extremity cellulitis, AKI, and acute on chronic hypercapnic respiratory failure in the setting of AECOPD.   1/23, patient was transferred to ICU due to CO2 narcosis and placed on BiPAP.  Patient is having worsening AKI as well as agitation requiring Precedex.  Patient is at high risk for intubation.  PMT was consulted to discuss goals of care. 1/25 Intubated 1/31 extubated and DNR/DNI confirmed 2/1 failed SLP and declining NG tube 2/5 - transferred to med/surg, diet advanced, pt not wanting NG  Summary of counseling/coordination of care: After reviewing the patient's chart and assessing the patient at bedside, I spoke with patient in regards to plan and goals of care.  I attempted to elicit goals and values important to the patient.  As per our discussion yesterday, I discussed patient's nutritional intake.  She  continues to have poor p.o. intake and is not meeting her nutritional needs.  We again discussed importance of food to avoid use of NG tube.  Patient continues to say she will eat and drink but just does not want to do it right now.  We also discussed that patient has excepted to go to SNF for rehab at discharge.  I introduced the idea of a MOST form.  I outlined the importance of additional advanced care planning documentation in order to make patient's wishes known.  Patient shared she did not want to speak about it right now and just wanted to rest.  Copy left for patient to review.  PMT will continue to follow and support the patient throughout her hospitalization.  Physical Exam Vitals reviewed.  Constitutional:      General: She is not in acute distress.    Appearance: She is obese. She is not ill-appearing.  HENT:     Mouth/Throat:     Mouth: Mucous membranes are moist.  Eyes:     Pupils: Pupils are equal, round, and reactive to light.  Cardiovascular:     Rate and Rhythm: Normal rate.     Pulses: Normal pulses.  Pulmonary:     Effort: Pulmonary effort is normal.  Abdominal:     Palpations: Abdomen is soft.  Musculoskeletal:     Comments: Generalized weakness  Skin:    General: Skin is warm and dry.     Comments: Bilateral  LE dressing clean dry and intact  Neurological:     Mental Status: She is alert and oriented to person, place, and time.  Psychiatric:        Behavior: Behavior normal.             Total Time 35 minutes   Karlo Goeden L. Ilsa Iha, FNP-BC Palliative Medicine Team Team Phone # [phone]

## 2022-06-25 NOTE — Progress Notes (Signed)
[facility]  ROUNDING NOTE   Subjective:   Samantha Mason  is a 72 year old female with past medical history including COPD with renal failure requiring 2 L oxygen as needed, chronic diastolic heart failure, sleep apnea, morbid obesity, hypertension, and chronic kidney disease stage IV. Patient presents to the emergency department feeling sick for a few days.  She was admitted for Sepsis [facility]) [A41.9] Cellulitis, unspecified cellulitis site [L03.90] Community acquired pneumonia, unspecified laterality [J18.9] Sepsis, due to unspecified organism, unspecified whether acute organ dysfunction present [facility]) [A41.9]  Patient is followed by our office outpatient. She was last seen in office on Apr 17, 2022.   Patient has moved from ICU to medical bed Remains on 2L [place] Allowed to eat pureed diet with thin liquids Lower extremity edema remains, greatly improved.   02/05 0701 - 02/06 0700 In: 896.5 [I.V.:746.6; IV Piggyback:149.9] Out: 35 [Stool:35] Lab Results  Component Value Date   CREATININE 3.12 (H) 06/25/2022   CREATININE 2.62 (H) 06/24/2022   CREATININE 2.58 (H) 06/23/2022      Objective:  Vital signs in last 24 hours:  Temp:  [97.5 F (36.4 C)-98.6 F (37 C)] 98 F (36.7 C) (02/06 1127) Pulse Rate:  [85-95] 89 (02/06 1127) Resp:  [18-20] 18 (02/06 1127) BP: (127-149)/(51-66) 149/60 (02/06 1127) SpO2:  [93 %-100 %] 100 % (02/06 1127) Weight:  [144 kg] 144 kg (02/06 0500)  Weight change:  Filed Weights   06/22/22 0514 06/23/22 0600 06/25/22 0500  Weight: (!) 141.4 kg (!) 143.7 kg (!) 144 kg    Intake/Output: I/O last 3 completed shifts: In: 956.5 [I.V.:746.6; Other:60; IV Piggyback:149.9] Out: 70 [Stool:70]   Intake/Output this shift:  Total I/O In: -  Out: 600 [Urine:300; Stool:300]  Physical Exam: General: NAD  Head: Normocephalic, atraumatic.    Eyes: Anicteric  Lungs:  Clear to auscultation, Normal effort, [place] O2  Heart: Regular rate and  rhythm  Abdomen:  Soft, nontender  Extremities: trace peripheral edema.  Neurologic: Alert and oriented  Skin: No lesions  Access: No hemodialysis access    Basic Metabolic Panel: Recent Labs  Lab 06/20/22 0444 06/21/22 0500 06/21/22 0530 06/22/22 0455 06/23/22 0408 06/24/22 0358 06/25/22 0500  NA 155*  --  158* 151* 150* 143 143  K 3.5  --  4.0 3.3* 3.6 3.3* 3.6  CL 106  --  112* 108 108 102 103  CO2 39*  --  37* 33* '30 29 28  '$ GLUCOSE 129*  --  111* 129* 110* 96 98  BUN 109*  --  87* 94* 88* 78* 78*  CREATININE 1.48*  --  1.83* 2.48* 2.58* 2.62* 3.12*  CALCIUM 9.3  --  9.1 9.1 9.0 8.7* 8.8*  MG 2.2 2.3  --  2.3 2.2 1.8 2.6*  PHOS 4.4  --  3.9 3.8 4.1 4.5  --      Liver Function Tests: Recent Labs  Lab 06/19/22 0343 06/20/22 0444 06/21/22 0530  ALBUMIN 2.5* 2.7* 2.6*    No results for input(s): "LIPASE", "AMYLASE" in the last 168 hours. No results for input(s): "AMMONIA" in the last 168 hours.   CBC: Recent Labs  Lab 06/21/22 0500 06/22/22 0455 06/23/22 0408 06/24/22 0358 06/25/22 0500  WBC 12.2* 11.8* 10.4 9.8 7.6  NEUTROABS  --  8.8* 7.9* 7.3  --   HGB 8.6* 8.3* 8.5* 8.0* 7.7*  HCT 30.6* 29.6* 29.9* 28.0* 26.5*  MCV 86.7 85.8 84.9 84.6 83.3  PLT 219 207 237 217 211  Cardiac Enzymes: No results for input(s): "CKTOTAL", "CKMB", "CKMBINDEX", "TROPONINI" in the last 168 hours.  BNP: Invalid input(s): "POCBNP"  CBG: Recent Labs  Lab 06/23/22 2310 06/24/22 0323 06/24/22 0724 06/24/22 1147 06/24/22 1550  GLUCAP 162* 120* 116* 111* 58     Microbiology: Results for orders placed or performed during the hospital encounter of 06/08/22  Blood Culture (routine x 2)     Status: None   Collection Time: 06/08/22  7:18 PM   Specimen: BLOOD  Result Value Ref Range Status   Specimen Description BLOOD BLOOD LEFT ARM  Final   Special Requests   Final    BOTTLES DRAWN AEROBIC AND ANAEROBIC Blood Culture adequate volume   Culture   Final    NO  GROWTH 5 DAYS Performed at [facility], [address] [place], [place] [address]    Report Status 06/13/2022 FINAL  Final  Blood Culture (routine x 2)     Status: None   Collection Time: 06/08/22  7:18 PM   Specimen: BLOOD  Result Value Ref Range Status   Specimen Description BLOOD BLOOD RIGHT ARM  Final   Special Requests   Final    BOTTLES DRAWN AEROBIC AND ANAEROBIC Blood Culture adequate volume   Culture   Final    NO GROWTH 5 DAYS Performed at [facility], [address] [place], [place] [address]    Report Status 06/13/2022 FINAL  Final  Resp panel by RT-PCR (RSV, Flu A&B, Covid) Anterior Nasal Swab     Status: None   Collection Time: 06/08/22  7:18 PM   Specimen: Anterior Nasal Swab  Result Value Ref Range Status   SARS Coronavirus 2 by RT PCR NEGATIVE NEGATIVE Final    Comment: (NOTE) SARS-CoV-2 target nucleic acids are NOT DETECTED.  The SARS-CoV-2 RNA is generally detectable in upper respiratory specimens during the acute phase of infection. The lowest concentration of SARS-CoV-2 viral copies this assay can detect is 138 copies/mL. A negative result does not preclude SARS-Cov-2 infection and should not be used as the sole basis for treatment or other patient management decisions. A negative result may occur with  improper specimen collection/handling, submission of specimen other than nasopharyngeal swab, presence of viral mutation(s) within the areas targeted by this assay, and inadequate number of viral copies(<138 copies/mL). A negative result must be combined with clinical observations, patient history, and epidemiological information. The expected result is Negative.  Fact Sheet for Patients:  EntrepreneurPulse.com.au  Fact Sheet for Healthcare Providers:  IncredibleEmployment.be  This test is no t yet approved or cleared by the [place] FDA and  has been authorized for detection and/or  diagnosis of SARS-CoV-2 by FDA under an Emergency Use Authorization (EUA). This EUA will remain  in effect (meaning this test can be used) for the duration of the COVID-19 declaration under Section 564(b)(1) of the Act, 21 U.S.C.section 360bbb-3(b)(1), unless the authorization is terminated  or revoked sooner.       Influenza A by PCR NEGATIVE NEGATIVE Final   Influenza B by PCR NEGATIVE NEGATIVE Final    Comment: (NOTE) The Xpert Xpress SARS-CoV-2/FLU/RSV plus assay is intended as an aid in the diagnosis of influenza from Nasopharyngeal swab specimens and should not be used as a sole basis for treatment. Nasal washings and aspirates are unacceptable for Xpert Xpress SARS-CoV-2/FLU/RSV testing.  Fact Sheet for Patients: EntrepreneurPulse.com.au  Fact Sheet for Healthcare Providers: IncredibleEmployment.be  This test is not yet approved or cleared by the [place]  FDA and has been authorized for detection and/or diagnosis of SARS-CoV-2 by FDA under an Emergency Use Authorization (EUA). This EUA will remain in effect (meaning this test can be used) for the duration of the COVID-19 declaration under Section 564(b)(1) of the Act, 21 U.S.C. section 360bbb-3(b)(1), unless the authorization is terminated or revoked.     Resp Syncytial Virus by PCR NEGATIVE NEGATIVE Final    Comment: (NOTE) Fact Sheet for Patients: EntrepreneurPulse.com.au  Fact Sheet for Healthcare Providers: IncredibleEmployment.be  This test is not yet approved or cleared by the [place] FDA and has been authorized for detection and/or diagnosis of SARS-CoV-2 by FDA under an Emergency Use Authorization (EUA). This EUA will remain in effect (meaning this test can be used) for the duration of the COVID-19 declaration under Section 564(b)(1) of the Act, 21 U.S.C. section 360bbb-3(b)(1), unless the authorization is terminated  or revoked.  Performed at [facility], [address] [place], [place] [address]   Urine Culture     Status: None   Collection Time: 06/08/22  9:34 PM   Specimen: In/Out Cath Urine  Result Value Ref Range Status   Specimen Description   Final    IN/OUT CATH URINE Performed at [facility], [address], [place], [place] [address]    Special Requests   Final    NONE Performed at [facility], [address], [place], [place] [address]    Culture   Final    NO GROWTH Performed at [facility], [place] [address] [place], [place] [address]    Report Status 06/10/2022 FINAL  Final  MRSA Next Gen by PCR, Nasal     Status: None   Collection Time: 06/10/22 11:49 PM   Specimen: Nasal Mucosa; Nasal Swab  Result Value Ref Range Status   MRSA by PCR Next Gen NOT DETECTED NOT DETECTED Final    Comment: (NOTE) The GeneXpert MRSA Assay (FDA approved for NASAL specimens only), is one component of a comprehensive MRSA colonization surveillance program. It is not intended to diagnose MRSA infection nor to guide or monitor treatment for MRSA infections. Test performance is not FDA approved in patients less than 65 years old. Performed at [facility], [place], [place] [address]   Respiratory (~20 pathogens) panel by PCR     Status: None   Collection Time: 06/12/22  6:15 AM   Specimen: Nasopharyngeal Swab; Respiratory  Result Value Ref Range Status   Adenovirus NOT DETECTED NOT DETECTED Final   Coronavirus 229E NOT DETECTED NOT DETECTED Final    Comment: (NOTE) The Coronavirus on the Respiratory Panel, DOES NOT test for the novel  Coronavirus (2019 nCoV)    Coronavirus HKU1 NOT DETECTED NOT DETECTED Final   Coronavirus NL63 NOT DETECTED NOT DETECTED Final   Coronavirus OC43 NOT DETECTED NOT DETECTED Final   Metapneumovirus NOT DETECTED NOT DETECTED Final   Rhinovirus / Enterovirus NOT DETECTED NOT DETECTED Final    Influenza A NOT DETECTED NOT DETECTED Final   Influenza B NOT DETECTED NOT DETECTED Final   Parainfluenza Virus 1 NOT DETECTED NOT DETECTED Final   Parainfluenza Virus 2 NOT DETECTED NOT DETECTED Final   Parainfluenza Virus 3 NOT DETECTED NOT DETECTED Final   Parainfluenza Virus 4 NOT DETECTED NOT DETECTED Final   Respiratory Syncytial Virus NOT DETECTED NOT DETECTED Final   Bordetella pertussis NOT DETECTED NOT DETECTED Final   Bordetella Parapertussis NOT DETECTED NOT DETECTED Final   Chlamydophila pneumoniae  NOT DETECTED NOT DETECTED Final   Mycoplasma pneumoniae NOT DETECTED NOT DETECTED Final    Comment: Performed at [facility], [place] [address] [place], [place] [address]  Culture, Respiratory w Gram Stain     Status: None   Collection Time: 06/17/22  8:15 AM   Specimen: Tracheal Aspirate; Respiratory  Result Value Ref Range Status   Specimen Description   Final    TRACHEAL ASPIRATE Performed at [facility], [address] [place], [place] [address]    Special Requests   Final    NONE Performed at [facility], [address] [place], [place] [address]    Gram Stain   Final    RARE WBC PRESENT, PREDOMINANTLY PMN RARE SQUAMOUS EPITHELIAL CELLS PRESENT FEW YEAST Performed at [facility], [place] [address] [place], [place] [address]    Culture FEW CANDIDA ALBICANS  Final   Report Status 06/19/2022 FINAL  Final    Coagulation Studies: No results for input(s): "LABPROT", "INR" in the last 72 hours.  Urinalysis: No results for input(s): "COLORURINE", "LABSPEC", "PHURINE", "GLUCOSEU", "HGBUR", "BILIRUBINUR", "KETONESUR", "PROTEINUR", "UROBILINOGEN", "NITRITE", "LEUKOCYTESUR" in the last 72 hours.  Invalid input(s): "APPERANCEUR"    Imaging: No results found.   Medications:      budesonide (PULMICORT) nebulizer solution  0.5 mg Nebulization BID   Chlorhexidine Gluconate Cloth  6 each Topical Daily   feeding supplement  237 mL Oral  TID BM   fluocinonide-emollient   Topical QODAY   Gerhardt's butt cream   Topical TID   heparin  5,000 Units Subcutaneous Q8H   ipratropium-albuterol  3 mL Nebulization BID   multivitamin with minerals  1 tablet Oral Daily   pantoprazole (PROTONIX) IV  40 mg Intravenous Q24H   docusate sodium, ipratropium-albuterol, lip balm, ondansetron **OR** ondansetron (ZOFRAN) IV, mouth rinse, mouth rinse, phenol  Assessment/ Plan:  Ms. Samantha Mason is a 72 y.o.  female  with past medical history including COPD with renal failure requiring 2 L oxygen as needed, chronic diastolic heart failure, sleep apnea, morbid obesity, hypertension, and chronic kidney disease stage IV. Patient presents to the emergency department feeling sick and was admitted for Sepsis [facility]) [A41.9] Cellulitis, unspecified cellulitis site [L03.90] Community acquired pneumonia, unspecified laterality [J18.9] Sepsis, due to unspecified organism, unspecified whether acute organ dysfunction present [facility]) [A41.9]   Acute Kidney Injury on chronic kidney disease stage IIIb with baseline creatinine 1.55 and GFR of 36 on 04/17/22.  Acute kidney injury secondary to severe infection. No IV contrast exposure.  Diuretics remain held. Lower extremity edema present, however patient has history of lymphedema.   Creatinine continues to rise today, Diuretics held. IVF held.  No acute need for dialysis at this time, patient agreeable to temporary dialysis if needed.    Lab Results  Component Value Date   CREATININE 3.12 (H) 06/25/2022   CREATININE 2.62 (H) 06/24/2022   CREATININE 2.58 (H) 06/23/2022    Intake/Output Summary (Last 24 hours) at 06/25/2022 1301 Last data filed at 06/25/2022 1125 Gross per 24 hour  Intake --  Output 635 ml  Net -635 ml    2. Anemia of chronic kidney disease Lab Results  Component Value Date   HGB 7.7 (L) 06/25/2022    Hemoglobin continues to decrease. Will order Aranesp weekly.  3.  Chronic diastolic  heart failure.  Echo from 07/16/21 shows EF 55 to 60% with mild LVH.    4.  Acute respiratory failure.  Weaned to  2L [place], continue supportive care   LOS: [place] 2/6/20241:01 PM

## 2022-06-25 NOTE — Plan of Care (Signed)
  Problem: Fluid Volume: Goal: Hemodynamic stability will improve Outcome: Progressing   Problem: Clinical Measurements: Goal: Diagnostic test results will improve Outcome: Progressing   Problem: Respiratory: Goal: Ability to maintain adequate ventilation will improve Outcome: Progressing   Problem: Education: Goal: Knowledge of General Education information will improve Description: Including pain rating scale, medication(s)/side effects and non-pharmacologic comfort measures Outcome: Progressing   Problem: Clinical Measurements: Goal: Ability to maintain clinical measurements within normal limits will improve Outcome: Progressing   Problem: Activity: Goal: Risk for activity intolerance will decrease Outcome: Progressing   Problem: Nutrition: Goal: Adequate nutrition will be maintained Outcome: Progressing   Problem: Coping: Goal: Level of anxiety will decrease Outcome: Progressing   Problem: Elimination: Goal: Will not experience complications related to bowel motility Outcome: Progressing   Problem: Pain Managment: Goal: General experience of comfort will improve Outcome: Progressing   Problem: Safety: Goal: Ability to remain free from injury will improve Outcome: Progressing   Problem: Skin Integrity: Goal: Risk for impaired skin integrity will decrease Outcome: Progressing

## 2022-06-25 NOTE — Progress Notes (Addendum)
Speech Language Pathology Treatment: Dysphagia  Patient Details Name: Samantha Mason MRN: [id number] DOB: 02/20/1951 Today's Date: 06/25/2022 Time: 1100-1130 SLP Time Calculation (min) (ACUTE ONLY): 30 min  Assessment / Plan / Recommendation Clinical Impression  Pt seen today for dysphagia treatment. Pt appeared alert and cooperative for entirety of session. Pt required min/mod support to sit more fully upright for oral intake. Pt left sitting up in bed w/ bed alarm set and call button in reach.  Pt on 2L O2 via [place]; afebrile; WBC WNL.  Pt does not appear to present w/ any overt, consistent s/s of oropharyngeal dysphagia. Pt appears at reduced risk for aspiration/aspiration pneumonia following aspiration precautions.  HOWEVER, pt has chronic issues including chart dx of Pulmonary decline and current "Toxic Metabolic Encephalopathy likely CO2 narcosis" as well as increased Confusion at times. These factors can impact alertness and swallowing and increase risk for aspiration events/aspiration pneumonia, as well as impact overall oral intake for nutrition/hydration.   Pt observed self feeding po's of solids (x5-6 bites), thin liquids (~6 oz), and mixed consistency (~10 bites oatmeal). During oral phase, pt exhibited adequate bolus control/management, timely bolus A-P transfer, and clear oral cavity post-po's. During pharyngeal phase, pt demonstrated seemingly timely pharyngeal swallow and clear vocal quality post-swallow. Noted ~2 instances of throat clearing w/ across 30+ bites/sips. Throat clearing was min and inconsistent.  Pt educated on general aspiration precautions and SLP POC. Pt appreciated and agreed.  Recommend continue Dys 3 diet w/ thin liquids via cup -- monitor straw use; if any increased throat clearing/coughing noted w/ straw use discontinue straw use. Recommend General aspiration precautions. Recommend meds crushed in puree.   No further acute skilled ST services at this time. MD  to reconsult if any new changes emerge during admission. MD/RN/Pt updated and agreed.   HPI HPI: Per H&P, "pt is a 72 y.o. female with medical history significant for Morbid obesity, chronic HFpEF,CKD stage III, chronic lower extremity lymphedema on treatment with torsemide, hypertension, asthma, stroke (1995) and obstructive sleep apnea(non compliant with cpap).  She presented to the emergency room accompanied by son with complaints of feeling unwell for the past 1 week.  She has been having chills but denied any fever.  She has also generally felt weak and fatigued.  She reports bilateral lower extremity swelling with associated redness.  Redness is more on the right eye compared to the left.  She had reported same to cardiology during the week and was advised to increase dose of Demadex to 60 mg.  She felt discomfort in her lower legs.  She also reports increasing weight gain over the same period.  She is having to use oxygen at all times during the day and also at night.  She admits to PND and orthopnea.  EMS was called to her home and was reported to have fever of 105F. In the ED, vitals were noted to be stable with some evidence of tachycardia. Initial temperature reported to be 105.  This is trended down to 98.6 at the time of my evaluation. Patient was initiated on sepsis protocol with IV fluids and antibiotics. Workup in the ED including CT abdomen and pelvis were done. Significant findings include an skin thickening and soft tissue edema of the proximal right thigh.  No obvious abscess when noted.  Large bilateral inguinal and iliac lymph nodes were noted.  Diffuse bronchial wall thickening was also noted." Noted pt intubated on 06/11/22 and extubated 06/19/22.      SLP  Plan  All goals met      Recommendations for follow up therapy are one component of a multi-disciplinary discharge planning process, led by the attending physician.  Recommendations may be updated based on patient status, additional  functional criteria and insurance authorization.    Recommendations  Diet recommendations: Dysphagia 3 (mechanical soft);Thin liquid Liquids provided via: Cup;Straw Medication Administration: Crushed with puree Supervision: Patient able to self feed;Intermittent supervision to cue for compensatory strategies Compensations: Minimize environmental distractions;Slow rate;Small sips/bites Postural Changes and/or Swallow Maneuvers: Seated upright 90 degrees;Upright 30-60 min after meal                Oral Care Recommendations: Oral care QID;Staff/trained caregiver to provide oral care Follow Up Recommendations: Follow physician's recommendations for discharge plan and follow up therapies Assistance recommended at discharge: Intermittent Supervision/Assistance SLP Visit Diagnosis: Dysphagia, pharyngeal phase (R13.13) Plan: All goals met             Randall Hiss Graduate Clinician [place], Speech Pathology  Randall Hiss 06/25/2022, 3:23 PM   The information in this patient note, response to treatment, and overall treatment plan developed has been reviewed and agreed upon after reviewing documentation. This session was performed under the supervision of this licensed clinician.   Orinda Kenner, [place], [place] [phone] 06/28/2022, 8:59am

## 2022-06-25 NOTE — TOC Progression Note (Signed)
Transition of Care [facility]) - Progression Note    Patient Details  Name: Samantha Mason MRN: [id number] Date of Birth: 1950/09/17  [facility] of [facility]) CM/SW Contact  Gerilyn Pilgrim, LCSW Phone Number: 06/25/2022, 10:10 AM  Clinical Narrative:   [facility] has accepted pt. Per MD, pt not quite medically ready. TOC continuing to follow.     Expected Discharge Plan: [place] Barriers to Discharge: Continued Medical Work up  Expected Discharge Plan and Services   Discharge Planning Services: CM Consult Post Acute Care Choice: [place] Living arrangements for the past 2 months: Single Family Home                 DME Arranged: N/A         HH Arranged: NA           Social Determinants of Health (SDOH) Interventions SDOH Screenings   Food Insecurity: No Food Insecurity (06/09/2022)  Housing: Low Risk  (06/09/2022)  Transportation Needs: No Transportation Needs (06/09/2022)  Utilities: Not At Risk (06/09/2022)  Alcohol Screen: Low Risk  (08/08/2021)  Depression (PHQ2-9): Medium Risk (02/25/2022)  Financial Resource Strain: Medium Risk (08/14/2021)  Physical Activity: Inactive (08/08/2021)  Social Connections: Socially Isolated (08/08/2021)  Stress: No Stress Concern Present (08/08/2021)  Tobacco Use: Low Risk  (06/11/2022)    Readmission Risk Interventions     No data to display

## 2022-06-25 NOTE — Plan of Care (Signed)
  Problem: Fluid Volume: Goal: Hemodynamic stability will improve 06/25/2022 2346 by Marilu Favre, RN Outcome: Progressing 06/25/2022 2345 by Marilu Favre, RN Outcome: Progressing   Problem: Clinical Measurements: Goal: Diagnostic test results will improve 06/25/2022 2346 by Marilu Favre, RN Outcome: Progressing 06/25/2022 2345 by Marilu Favre, RN Outcome: Progressing   Problem: Respiratory: Goal: Ability to maintain adequate ventilation will improve 06/25/2022 2346 by Marilu Favre, RN Outcome: Progressing 06/25/2022 2345 by Marilu Favre, RN Outcome: Progressing   Problem: Education: Goal: Knowledge of General Education information will improve Description: Including pain rating scale, medication(s)/side effects and non-pharmacologic comfort measures Outcome: Progressing   Problem: Health Behavior/Discharge Planning: Goal: Ability to manage health-related needs will improve Outcome: Progressing   Problem: Clinical Measurements: Goal: Ability to maintain clinical measurements within normal limits will improve Outcome: Progressing   Problem: Activity: Goal: Risk for activity intolerance will decrease 06/25/2022 2346 by Marilu Favre, RN Outcome: Progressing 06/25/2022 2345 by Marilu Favre, RN Outcome: Progressing   Problem: Nutrition: Goal: Adequate nutrition will be maintained 06/25/2022 2346 by Marilu Favre, RN Outcome: Progressing 06/25/2022 2345 by Marilu Favre, RN Outcome: Progressing   Problem: Coping: Goal: Level of anxiety will decrease 06/25/2022 2346 by Marilu Favre, RN Outcome: Progressing 06/25/2022 2345 by Marilu Favre, RN Outcome: Progressing   Problem: Pain Managment: Goal: General experience of comfort will improve Outcome: Progressing   Problem: Safety: Goal: Ability to remain free from injury will improve 06/25/2022 2346 by Marilu Favre, RN Outcome: Progressing 06/25/2022 2345 by Marilu Favre, RN Outcome: Progressing   Problem: Skin  Integrity: Goal: Risk for impaired skin integrity will decrease Outcome: Progressing   Problem: Fluid Volume: Goal: Ability to maintain a balanced intake and output will improve Outcome: Progressing   Problem: Nutritional: Goal: Maintenance of adequate nutrition will improve Outcome: Progressing   Problem: Skin Integrity: Goal: Risk for impaired skin integrity will decrease Outcome: Progressing   Problem: Tissue Perfusion: Goal: Adequacy of tissue perfusion will improve Outcome: Progressing

## 2022-06-25 NOTE — Progress Notes (Signed)
PHARMACIST - PHYSICIAN COMMUNICATION   CONCERNING: IV to Oral Route Change Policy  RECOMMENDATION: This patient is receiving pantoprazole by the intravenous route.  Based on criteria approved by the Pharmacy and Therapeutics Committee, the intravenous medication(s) is/are being converted to the equivalent oral dose form(s).   DESCRIPTION: These criteria include: The patient is eating (either orally or via tube) and/or has been taking other orally administered medications for a least 24 hours The patient has no evidence of active gastrointestinal bleeding or impaired GI absorption (gastrectomy, short bowel, patient on TNA or NPO).  If you have questions about this conversion, please contact the Pharmacy Department  '[]'$   [phone] )  [facility] '[x]'$   [phone] )  [facility] '[]'$   [phone] )  [facility] '[]'$   [phone] )  [facility] '[]'$   [phone] )  [place], PharmD, BCPS Clinical Pharmacist  06/25/2022 7:18 AM

## 2022-06-25 NOTE — Progress Notes (Addendum)
PT Cancellation Note  Patient Details Name: JANIE CAPP MRN: [id number] DOB: 1951-04-11   Cancelled Treatment:     PT attempt. Pt refused. She was asleep upon entry but easily awakes. Untouched breakfast tray at bedside. Encouraged participation and OOB to eat but pt states " Not right now." Will return later this date and continue to follow per current POC.     1519: 3rd attempt made today. Pt remains unwilling. She was unable to stay awake for more than a few minutes before drifting back to sleep.   Willette Pa 06/25/2022, 10:31 AM

## 2022-06-25 NOTE — Progress Notes (Signed)
  PROGRESS NOTE    Samantha Mason  YKD:[id number] DOB: Jun 23, 1950 DOA: 06/08/2022 PCP: Glean Hess, MD  120A/120A-AA  LOS: 17 days   Brief hospital course:   Assessment & Plan: Samantha Mason is a 72 y.o. female with medical history significant for Morbid obesity, chronic HFpEF,CKD stage III, chronic lower extremity lymphedema on treatment with torsemide, hypertension, asthma, stroke (1995) and obstructive sleep apnea(non compliant with cpap) who presented to [facility] ED on 06/08/22 due to shortness of breath and generalized malaise for appropriately 1 week.   Pt was admitted with Sepsis in setting of lower extremity Cellulitis, AKI, and Acute on Chronic Hypercapnic Respiratory Failure in setting AECOPD. Course complicated by CO2 narcosis, failed BiPAP requiring intubation and mechanical ventilation.  Extubated on 1/31.  Transferred to hospitalist service on 06/24/22.  #Toxic Metabolic Encephalopathy Mental status improved with improvement in her CO2 levels and resolution of CO2 narcosis and metabolic derangements. EEG negative for seizures, CT head normal.   #Acute on Chronic HFpEF --TTE with diastolic dysfunction and signs of restriction, and she has been diuresed with good effect, and is now contracted.  -Holding diuresis given contraction, hypernatremia, and renal failure.   #Acute on Chronic Hypoxic and Hypercapnic Respiratory Failure Hypercapnic respiratory failure secondary to a combination of HFpEF and OHS. Initially intubated and extubated on 06/19/2022. Her respiratory failure was treated with BiPAP, diuresis, and bronchodilators. She is refusing BiPAP now. Currently on 2L [place]. --Continue supplemental O2 to keep sats >=90%, wean as tolerated -continue supportive care with bronchodilators -palliative care involved -DNR/DNI   #Acute on Chronic Kidney Injury #Hypernatremia, resolved #Contraction Alkalosis --nephrology consulted.  Acute kidney injury secondary to severe  infection. No IV contrast exposure.  --Cr continues to rise this morning. Plan: --holding diuretics and IVF --management per nephrology   #Anemia of Chronic Disease   # Hypokalemia --monitor and replete PRN   DVT prophylaxis: Heparin SQ Code Status: DNR  Family Communication:  Level of care: Med-Surg Dispo:   The patient is from: home Anticipated d/c is to: SNF rehab Anticipated d/c date is: to be determined.  Cr continues to trend up.   Subjective and Interval History:  Rectal tube removed today without issue.    Cr worsened this morning.  Unclear etiology.   Objective: Vitals:   06/25/22 0816 06/25/22 1127 06/25/22 1531 06/25/22 2057  BP:  (!) 149/60 (!) 133/47   Pulse:  89 96   Resp:  '18 20 17  '$ Temp:  98 F (36.7 C) 98.5 F (36.9 C)   TempSrc:      SpO2: 93% 100% 97% 98%  Weight:      Height:        Intake/Output Summary (Last 24 hours) at 06/25/2022 2107 Last data filed at 06/25/2022 1125 Gross per 24 hour  Intake --  Output 600 ml  Net -600 ml   Filed Weights   06/22/22 0514 06/23/22 0600 06/25/22 0500  Weight: (!) 141.4 kg (!) 143.7 kg (!) 144 kg    Examination:   Constitutional: NAD, AAOx3 HEENT: conjunctivae and lids normal, EOMI CV: No cyanosis.   RESP: normal respiratory effort, on RA SKIN: erythema and rash around groin areas and peri-anal area.   Data Reviewed: I have personally reviewed labs and imaging studies  Time spent: 35 minutes  Enzo Bi, MD [facility] If 7PM-7AM, please contact night-coverage 06/25/2022, 9:07 PM

## 2022-06-26 DIAGNOSIS — J189 Pneumonia, unspecified organism: Secondary | ICD-10-CM | POA: Diagnosis not present

## 2022-06-26 DIAGNOSIS — E662 Morbid (severe) obesity with alveolar hypoventilation: Secondary | ICD-10-CM | POA: Diagnosis not present

## 2022-06-26 DIAGNOSIS — A419 Sepsis, unspecified organism: Secondary | ICD-10-CM | POA: Diagnosis not present

## 2022-06-26 DIAGNOSIS — L0390 Cellulitis, unspecified: Secondary | ICD-10-CM | POA: Diagnosis not present

## 2022-06-26 DIAGNOSIS — R6520 Severe sepsis without septic shock: Secondary | ICD-10-CM | POA: Diagnosis not present

## 2022-06-26 DIAGNOSIS — Z8673 Personal history of transient ischemic attack (TIA), and cerebral infarction without residual deficits: Secondary | ICD-10-CM

## 2022-06-26 LAB — BASIC METABOLIC PANEL
Anion gap: 11 (ref 5–15)
BUN: 77 mg/dL — ABNORMAL HIGH (ref 8–23)
CO2: 29 mmol/L (ref 22–32)
Calcium: 9 mg/dL (ref 8.9–10.3)
Chloride: 101 mmol/L (ref 98–111)
Creatinine, Ser: 3.49 mg/dL — ABNORMAL HIGH (ref 0.44–1.00)
GFR, Estimated: 14 mL/min — ABNORMAL LOW (ref >60)
Glucose, Bld: 101 mg/dL — ABNORMAL HIGH (ref 70–99)
Potassium: 3.6 mmol/L (ref 3.5–5.1)
Sodium: 141 mmol/L (ref 135–145)

## 2022-06-26 LAB — NOROVIRUS GROUP 1 & 2 BY PCR, STOOL

## 2022-06-26 LAB — CBC
HCT: 26.9 % — ABNORMAL LOW (ref 36.0–46.0)
Hemoglobin: 7.7 g/dL — ABNORMAL LOW (ref 12.0–15.0)
MCH: 24.1 pg — ABNORMAL LOW (ref 26.0–34.0)
MCHC: 28.6 g/dL — ABNORMAL LOW (ref 30.0–36.0)
MCV: 84.1 fL (ref 80.0–100.0)
Platelets: 228 10*3/uL (ref 150–400)
RBC: 3.2 MIL/uL — ABNORMAL LOW (ref 3.87–5.11)
RDW: 18 % — ABNORMAL HIGH (ref 11.5–15.5)
WBC: 7 10*3/uL (ref 4.0–10.5)
nRBC: 0 % (ref 0.0–0.2)

## 2022-06-26 LAB — MAGNESIUM: Magnesium: 2.5 mg/dL — ABNORMAL HIGH (ref 1.7–2.4)

## 2022-06-26 NOTE — Progress Notes (Addendum)
PROGRESS NOTE    Samantha Mason   VZC:[id number] DOB: Jun 18, 1950  DOA: 06/08/2022 Date of Service: 06/26/22 PCP: Glean Hess, MD     Brief Narrative / Hospital Course:  72 y.o. female with medical history significant for Morbid obesity, chronic HFpEF,CKD stage III, chronic lower extremity lymphedema on treatment with torsemide, hypertension, asthma, stroke (1995) and obstructive sleep apnea (non compliant with cpap) to ED w/ CC fatigue, LE swelling/redness. Pt was admitted Course complicated by CO2 narcosis, failed BiPAP requiring intubation and mechanical ventilation.  Transferred to hospitalist service on 06/24/22. Treated for Acute on Chronic HFpEF. Her respiratory failure has been treated with BiPAP, diuresis, and bronchodilators. She has been refusing BiPAP now. Currently on 2L [place]. Diuresis held, Cr worsens despite this, nephrology following.  1/20: Presented to ED, admitted by [facility] with Sepsis in setting of lower extremity Cellulitis, AKI, and Acute on Chronic Hypercapnic Respiratory Failure in setting AECOPD.  1/21: Added cefepime, continue vancomycin, increase dose of Cardizem for better heart rate control 1/22: Had to reduce Cardizem dose back to her home dose as blood pressure dropped 1/23: Transferred to ICU overnight due to CO2 narcosis, placed on BiPAP.  PCCM consulted.  Worsening AKI, holding diuresis.  Agitation requiring Precedex, high risk for Intubation.  Palliative Care consulted for [place] conversations.  ABX broadened back to Cefepime and Vancomycin 1/24: Pt on Bipap but awake and able to follow commands. Attempt to transition off Bipap.  Palliative Care following 1/25: Pt currently on Bipap and precedex gtt.  Required 2.5 mg iv valium overnight for delirium.  Unable to obtain CT Head due to respiratory status.  Ultimately required intubation. 1/26: Remains critically ill. Issues with vent dyssynchrony and ventilation, plan to start Nimbex gtt. Creatinine improved with  diuresis, continue with diuresis, Nephrology considering Lasix gtt. 1/27: ABG's improved on Nimbex, persistent cuff leak, exchanged ETT. Decadron started due to upper airway swelling. Will attempt to d/c Nimbex. Good diuresis with Lasix gtt with 4.5 L of UOP last 24 hrs, Creatinine improving. 1/28: Continues with good diuresis on Lasix gtt, 3L of UOP over preceding 24 hrs, creatinine continues to improve.  Vent requirements slowly improving. 1/29: No acute events overnight. Remained mechanically intubated with minimal vent settings: PEEP 5/FiO2 40%.  Remains on lasix gtt '@2mg'$ /hr.  Not on sedation and attempting to follow commands but extremely weak.  Will perform SBT as tolerated   1/30: No acute events overnight.  Pt awake and following commands on precedex gtt '@0'$ .2 mcg/kg/hr.  Lasix gtt '@4'$  mg/hr. Tolerating SBT 15/8 1/31: No acute events, awake and following commands on low dose precedex.  Remains on Lasix gtt @ '4mg'$ /hr,  Creatinine remains stable BUN slowly uptrending. Tolerating SBT 15/8 ~ will continue to wean PSV as tolerated ~ EXTUBATED. Lasix gtt d/c following extubation.  Following extubation, pt confirmed DNR/DNI status in presence of her son at bedside. 2/1: Tolerated BiPAP overnight, weaned to [facility] this am.  Speech evaluation performed, which pt FAILED. Consult PT/OT.  Creatinine continues to improve, started on D5W @ 50 ml/hr due to Hypernatremia of 155.  Holding diuresis today 2/2: Pt febrile currently on Bipap '@50'$ %.  Pt awake and following commands.  Failed speech evaluation on 02/1 and declining NGT placement.  2/2: Venous US BLE: No evidence of deep venous thrombosis in either lower extremity.  2/3: During goals of care discussion pt agreeable to small bore NGT placement and dialysis if needed.  She confirmed DNR/DNI  2/4: Nursing staff reporting pt now  tolerating ice chips without signs of aspiration.  Will contact speech therapy to evaluate pt again.  Dobhoff placement pending.  No acute  events overnight  2/5: back to hospitalist service 2/6: Cr continuing to worsen, nephrology following, pt agreeable to temporary HD if needed 2/7: Cr climbing today.    Consultants:  PCCU Nephrology  Palliative Care   Procedures: EEG 01/26 Art line insertion 01/26 CVC to L IJ 01/25 Intubation 01/25      ASSESSMENT & PLAN:   Principal Problem:   Severe sepsis ([place]) Active Problems:   Acute on chronic respiratory failure with hypoxia and hypercapnia (HCC)   Chronic diastolic CHF (congestive heart failure) (HCC)   Essential hypertension   Acute renal failure superimposed on stage 3b chronic kidney disease ([place])   Morbid obesity with BMI of 50.0-59.9, adult (HCC)   OSA    History of stroke   Extreme obesity with alveolar hypoventilation (HCC)   Cellulitis   Acute metabolic encephalopathy   Community acquired pneumonia  Toxic Metabolic Encephalopathy likely CO2 narcosis - resolved Mental status improved with improvement in her CO2 levels and resolution of CO2 narcosis and metabolic derangements.   Acute on Chronic Kidney Injury likely secondary to severe infection/sepsis on admission, was improving then worse again, concern for volume contraction / diuresis effect  Hypernatremia - resolved Contraction Alkalosis - resolved  holding diuretics, holding IVF management per nephrology - may need to consider dialysis   Acute on Chronic Hypoxic and Hypercapnic Respiratory Failure D/t Acute on Chronic HFpEF supplemental O2 to keep sats >=90%, wean as tolerated palliative care involved DNR/DNI  continue supportive care with bronchodilators  Acute on Chronic HFpEF Holding diuresis given contraction, hypernatremia, and renal failure.    Anemia of Chronic Disease Monitor CBC Aranesp weekly   Hypokalemia monitor and replete PRN       DVT prophylaxis: heparin sq Pertinent IV fluids/nutrition: none Central lines / invasive devices: none  Code Status: DNR  Current  Admission Status: inpatient   TOC needs / Dispo plan: SNF Barriers to discharge / significant pending items: renal function precludes discharge              Subjective / Brief ROS:  Patient reports feeling tired, no other concerns  Denies CP/SOB.  Pain controlled.  Denies new weakness.  Tolerating diet.  Reports no concerns w/ urination/defecation.   Family Communication: spoke to daughter's wife today 06/26/22 4:34 PM     Objective Findings:  Vitals:   06/26/22 0500 06/26/22 0802 06/26/22 0817 06/26/22 1233  BP:  (!) 144/54  (!) 139/53  Pulse:  91  90  Resp:  (!) 21  18  Temp:  97.7 F (36.5 C)  98 F (36.7 C)  TempSrc:      SpO2:  96% 98% 96%  Weight: (!) 146.7 kg     Height:       No intake or output data in the 24 hours ending 06/26/22 1322  Filed Weights   06/23/22 0600 06/25/22 0500 06/26/22 0500  Weight: (!) 143.7 kg (!) 144 kg (!) 146.7 kg    Examination:  Physical Exam Constitutional:      General: She is not in acute distress.    Appearance: She is obese.  Cardiovascular:     Rate and Rhythm: Normal rate and regular rhythm.     Heart sounds: Normal heart sounds.  Pulmonary:     Effort: Pulmonary effort is normal.     Breath sounds: Normal breath sounds.  Abdominal:     Palpations: Abdomen is soft.  Musculoskeletal:        General: No swelling.  Skin:    General: Skin is warm and dry.  Neurological:     General: No focal deficit present.     Mental Status: She is alert.  Psychiatric:        Mood and Affect: Mood normal.        Behavior: Behavior normal.          Scheduled Medications:   budesonide (PULMICORT) nebulizer solution  0.5 mg Nebulization BID   Chlorhexidine Gluconate Cloth  6 each Topical Daily   darbepoetin (ARANESP) injection - NON-DIALYSIS  60 mcg Subcutaneous Q Tue-1800   feeding supplement  237 mL Oral TID BM   fluocinonide-emollient   Topical QODAY   Gerhardt's butt cream   Topical TID   heparin  5,000  Units Subcutaneous Q8H   ipratropium-albuterol  3 mL Nebulization BID   multivitamin with minerals  1 tablet Oral Daily   pantoprazole (PROTONIX) IV  40 mg Intravenous Q24H    Continuous Infusions:   PRN Medications:  docusate sodium, ipratropium-albuterol, lip balm, ondansetron **OR** ondansetron (ZOFRAN) IV, mouth rinse, mouth rinse, phenol  Antimicrobials from admission:  Anti-infectives (From admission, onward)    Start     Dose/Rate Route Frequency Ordered Stop   06/14/22 1400  ceFEPIme (MAXIPIME) 2 g in sodium chloride 0.9 % 100 mL IVPB        2 g 200 mL/hr over 30 Minutes Intravenous Every 12 hours 06/14/22 1301 06/21/22 0236   06/13/22 1000  ceFEPIme (MAXIPIME) 2 g in sodium chloride 0.9 % 100 mL IVPB  Status:  Discontinued        2 g 200 mL/hr over 30 Minutes Intravenous Every 24 hours 06/12/22 1430 06/13/22 0747   06/13/22 0900  cefTRIAXone (ROCEPHIN) 2 g in sodium chloride 0.9 % 100 mL IVPB  Status:  Discontinued        2 g 200 mL/hr over 30 Minutes Intravenous Every 24 hours 06/13/22 0747 06/14/22 1230   06/11/22 1145  vancomycin (VANCOREADY) IVPB 2000 mg/400 mL        2,000 mg 200 mL/hr over 120 Minutes Intravenous  Once 06/11/22 1047 06/11/22 1344   06/11/22 1145  ceFEPIme (MAXIPIME) 2 g in sodium chloride 0.9 % 100 mL IVPB  Status:  Discontinued        2 g 200 mL/hr over 30 Minutes Intravenous 2 times daily 06/11/22 1047 06/12/22 1430   06/10/22 2300  vancomycin (VANCOCIN) IVPB 1000 mg/200 mL premix  Status:  Discontinued       See Hyperspace for full Linked Orders Report.   1,000 mg 200 mL/hr over 60 Minutes Intravenous Every 48 hours 06/08/22 2307 06/09/22 0922   06/10/22 2300  vancomycin (VANCOREADY) IVPB 1250 mg/250 mL  Status:  Discontinued       See Hyperspace for full Linked Orders Report.   1,250 mg 166.7 mL/hr over 90 Minutes Intravenous Every 48 hours 06/08/22 2307 06/09/22 0922   06/10/22 1800  cephALEXin (KEFLEX) capsule 500 mg  Status:  Discontinued         500 mg Oral Every 6 hours 06/10/22 1521 06/11/22 1047   06/09/22 2200  vancomycin (VANCOREADY) IVPB 750 mg/150 mL  Status:  Discontinued        750 mg 150 mL/hr over 60 Minutes Intravenous Every 24 hours 06/09/22 0923 06/10/22 1521   06/09/22 1000  ceFEPIme (MAXIPIME) 2 g  in sodium chloride 0.9 % 100 mL IVPB  Status:  Discontinued        2 g 200 mL/hr over 30 Minutes Intravenous Every 12 hours 06/09/22 0916 06/10/22 1521   06/08/22 2300  vancomycin (VANCOCIN) IVPB 1000 mg/200 mL premix  Status:  Discontinued        1,000 mg 200 mL/hr over 60 Minutes Intravenous  Once 06/08/22 2257 06/08/22 2307   06/08/22 2215  vancomycin (VANCOCIN) IVPB 1000 mg/200 mL premix       See Hyperspace for full Linked Orders Report.   1,000 mg 200 mL/hr over 60 Minutes Intravenous  Once 06/08/22 2210 06/09/22 0014   06/08/22 2215  vancomycin (VANCOREADY) IVPB 1500 mg/300 mL       See Hyperspace for full Linked Orders Report.   1,500 mg 150 mL/hr over 120 Minutes Intravenous  Once 06/08/22 2210 06/10/22 0807   06/08/22 1915  piperacillin-tazobactam (ZOSYN) IVPB 3.375 g        3.375 g 100 mL/hr over 30 Minutes Intravenous  Once 06/08/22 1909 06/08/22 1959           Data Reviewed:  I have personally reviewed the following...  CBC: Recent Labs  Lab 06/22/22 0455 06/23/22 0408 06/24/22 0358 06/25/22 0500 06/26/22 0434  WBC 11.8* 10.4 9.8 7.6 7.0  NEUTROABS 8.8* 7.9* 7.3  --   --   HGB 8.3* 8.5* 8.0* 7.7* 7.7*  HCT 29.6* 29.9* 28.0* 26.5* 26.9*  MCV 85.8 84.9 84.6 83.3 84.1  PLT 207 237 217 211 962   Basic Metabolic Panel: Recent Labs  Lab 06/20/22 0444 06/21/22 0500 06/21/22 0530 06/22/22 0455 06/23/22 0408 06/24/22 0358 06/25/22 0500 06/26/22 0434  NA 155*  --  158* 151* 150* 143 143 141  K 3.5  --  4.0 3.3* 3.6 3.3* 3.6 3.6  CL 106  --  112* 108 108 102 103 101  CO2 39*  --  37* 33* '30 29 28 29  '$ GLUCOSE 129*  --  111* 129* 110* 96 98 101*  BUN 109*  --  87* 94* 88* 78* 78*  77*  CREATININE 1.48*  --  1.83* 2.48* 2.58* 2.62* 3.12* 3.49*  CALCIUM 9.3  --  9.1 9.1 9.0 8.7* 8.8* 9.0  MG 2.2   < >  --  2.3 2.2 1.8 2.6* 2.5*  PHOS 4.4  --  3.9 3.8 4.1 4.5  --   --    < > = values in this interval not displayed.   GFR: Estimated Creatinine Clearance: 21.7 mL/min (A) (by C-G formula based on SCr of 3.49 mg/dL (H)). Liver Function Tests: Recent Labs  Lab 06/20/22 0444 06/21/22 0530  ALBUMIN 2.7* 2.6*   No results for input(s): "LIPASE", "AMYLASE" in the last 168 hours. No results for input(s): "AMMONIA" in the last 168 hours. Coagulation Profile: No results for input(s): "INR", "PROTIME" in the last 168 hours. Cardiac Enzymes: No results for input(s): "CKTOTAL", "CKMB", "CKMBINDEX", "TROPONINI" in the last 168 hours. BNP (last 3 results) No results for input(s): "PROBNP" in the last 8760 hours. HbA1C: No results for input(s): "HGBA1C" in the last 72 hours. CBG: Recent Labs  Lab 06/23/22 2310 06/24/22 0323 06/24/22 0724 06/24/22 1147 06/24/22 1550  GLUCAP 162* 120* 116* 111* 93   Lipid Profile: No results for input(s): "CHOL", "HDL", "LDLCALC", "TRIG", "CHOLHDL", "LDLDIRECT" in the last 72 hours. Thyroid Function Tests: No results for input(s): "TSH", "T4TOTAL", "FREET4", "T3FREE", "THYROIDAB" in the last 72 hours. Anemia Panel: No results  for input(s): "VITAMINB12", "FOLATE", "FERRITIN", "TIBC", "IRON", "RETICCTPCT" in the last 72 hours. Most Recent Urinalysis On File:     Component Value Date/Time   COLORURINE YELLOW (A) 06/08/2022 2134   APPEARANCEUR CLOUDY (A) 06/08/2022 2134   APPEARANCEUR Clear 04/27/2012 0958   LABSPEC 1.038 (H) 06/08/2022 2134   LABSPEC 1.014 04/27/2012 0958   PHURINE 5.0 06/08/2022 2134   GLUCOSEU NEGATIVE 06/08/2022 2134   GLUCOSEU Negative 04/27/2012 0958   HGBUR LARGE (A) 06/08/2022 2134   BILIRUBINUR NEGATIVE 06/08/2022 2134   BILIRUBINUR neg 02/19/2021 1040   BILIRUBINUR Negative 04/27/2012 0958   KETONESUR  NEGATIVE 06/08/2022 2134   PROTEINUR NEGATIVE 06/08/2022 2134   UROBILINOGEN 0.2 02/19/2021 1040   NITRITE NEGATIVE 06/08/2022 2134   LEUKOCYTESUR NEGATIVE 06/08/2022 2134   LEUKOCYTESUR Negative 04/27/2012 0958   Sepsis Labs: '@LABRCNTIP'$ (procalcitonin:4,lacticidven:4) Microbiology: Recent Results (from the past 240 hour(s))  Culture, Respiratory w Gram Stain     Status: None   Collection Time: 06/17/22  8:15 AM   Specimen: Tracheal Aspirate; Respiratory  Result Value Ref Range Status   Specimen Description   Final    TRACHEAL ASPIRATE Performed at [facility], [address] [place], [place] [address]    Special Requests   Final    NONE Performed at [facility], [address] [place], [place] [address]    Gram Stain   Final    RARE WBC PRESENT, PREDOMINANTLY PMN RARE SQUAMOUS EPITHELIAL CELLS PRESENT FEW YEAST Performed at [facility], [place] [address], [place], [place] [address]    Culture FEW CANDIDA ALBICANS  Final   Report Status 06/19/2022 FINAL  Final      Radiology Studies last 3 days: No results found.           LOS: 18 days       Emeterio Reeve, DO [facility] 06/26/2022, 1:22 PM    Dictation software may have been used to generate the above note. Typos may occur and escape review in typed/dictated notes. Please contact Dr Sheppard Coil directly for clarity if needed.  Staff may message me via secure chat in [place]  but this may not receive an immediate response,  please page me for urgent matters!  If 7PM-7AM, please contact night coverage [email or web address]

## 2022-06-26 NOTE — Progress Notes (Signed)
OT Cancellation Note  Patient Details Name: Samantha Mason MRN: [id number] DOB: [birth year]   Cancelled Treatment:    Reason Eval/Treat Not Completed: Other (comment) (pt reports just working with PT, is exausted, requests therapist to come back another time. OT will reattempt as able.Shanon Payor, OTD OTR/L  06/26/22, 2:58 PM

## 2022-06-26 NOTE — Progress Notes (Signed)
[facility]  ROUNDING NOTE   Subjective:   Samantha Mason  is a 72 year old female with past medical history including COPD with renal failure requiring 2 L oxygen as needed, chronic diastolic heart failure, sleep apnea, morbid obesity, hypertension, and chronic kidney disease stage IV. Patient presents to the emergency department feeling sick for a few days.  She was admitted for Sepsis [facility]) [A41.9] Cellulitis, unspecified cellulitis site [L03.90] Community acquired pneumonia, unspecified laterality [J18.9] Sepsis, due to unspecified organism, unspecified whether acute organ dysfunction present [facility]) [A41.9]  Patient is followed by our office outpatient. She was last seen in office on Apr 17, 2022.   Patient seen resting quietly Remains on 2L [place] Reports bilateral foot soreness Appetite poor, but she tries to eat something with every meal.    02/06 0701 - 02/07 0700 In: -  Out: 600 [Urine:300; Stool:300] Lab Results  Component Value Date   CREATININE 3.49 (H) 06/26/2022   CREATININE 3.12 (H) 06/25/2022   CREATININE 2.62 (H) 06/24/2022      Objective:  Vital signs in last 24 hours:  Temp:  [97.7 F (36.5 C)-98.7 F (37.1 C)] 98 F (36.7 C) (02/07 1233) Pulse Rate:  [87-96] 90 (02/07 1233) Resp:  [17-21] 18 (02/07 1233) BP: (133-144)/(46-54) 139/53 (02/07 1233) SpO2:  [96 %-100 %] 96 % (02/07 1233) FiO2 (%):  [32 %] 32 % (02/06 2100) Weight:  [146.7 kg] 146.7 kg (02/07 0500)  Weight change: 2.701 kg Filed Weights   06/23/22 0600 06/25/22 0500 06/26/22 0500  Weight: (!) 143.7 kg (!) 144 kg (!) 146.7 kg    Intake/Output: I/O last 3 completed shifts: In: -  Out: 635 [Urine:300; Stool:335]   Intake/Output this shift:  No intake/output data recorded.  Physical Exam: General: NAD  Head: Normocephalic, atraumatic.    Eyes: Anicteric  Lungs:  Clear to auscultation, Normal effort, [place] O2  Heart: Regular rate and rhythm  Abdomen:  Soft, nontender   Extremities: No peripheral edema.  Neurologic: Alert and oriented  Skin: No lesions  Access: No hemodialysis access    Basic Metabolic Panel: Recent Labs  Lab 06/20/22 0444 06/21/22 0500 06/21/22 0530 06/22/22 0455 06/23/22 0408 06/24/22 0358 06/25/22 0500 06/26/22 0434  NA 155*  --  158* 151* 150* 143 143 141  K 3.5  --  4.0 3.3* 3.6 3.3* 3.6 3.6  CL 106  --  112* 108 108 102 103 101  CO2 39*  --  37* 33* '30 29 28 29  '$ GLUCOSE 129*  --  111* 129* 110* 96 98 101*  BUN 109*  --  87* 94* 88* 78* 78* 77*  CREATININE 1.48*  --  1.83* 2.48* 2.58* 2.62* 3.12* 3.49*  CALCIUM 9.3  --  9.1 9.1 9.0 8.7* 8.8* 9.0  MG 2.2   < >  --  2.3 2.2 1.8 2.6* 2.5*  PHOS 4.4  --  3.9 3.8 4.1 4.5  --   --    < > = values in this interval not displayed.     Liver Function Tests: Recent Labs  Lab 06/20/22 0444 06/21/22 0530  ALBUMIN 2.7* 2.6*    No results for input(s): "LIPASE", "AMYLASE" in the last 168 hours. No results for input(s): "AMMONIA" in the last 168 hours.   CBC: Recent Labs  Lab 06/22/22 0455 06/23/22 0408 06/24/22 0358 06/25/22 0500 06/26/22 0434  WBC 11.8* 10.4 9.8 7.6 7.0  NEUTROABS 8.8* 7.9* 7.3  --   --   HGB 8.3*  8.5* 8.0* 7.7* 7.7*  HCT 29.6* 29.9* 28.0* 26.5* 26.9*  MCV 85.8 84.9 84.6 83.3 84.1  PLT 207 237 217 211 228     Cardiac Enzymes: No results for input(s): "CKTOTAL", "CKMB", "CKMBINDEX", "TROPONINI" in the last 168 hours.  BNP: Invalid input(s): "POCBNP"  CBG: Recent Labs  Lab 06/23/22 2310 06/24/22 0323 06/24/22 0724 06/24/22 1147 06/24/22 1550  GLUCAP 162* 120* 116* 111* 17     Microbiology: Results for orders placed or performed during the hospital encounter of 06/08/22  Blood Culture (routine x 2)     Status: None   Collection Time: 06/08/22  7:18 PM   Specimen: BLOOD  Result Value Ref Range Status   Specimen Description BLOOD BLOOD LEFT ARM  Final   Special Requests   Final    BOTTLES DRAWN AEROBIC AND ANAEROBIC Blood  Culture adequate volume   Culture   Final    NO GROWTH 5 DAYS Performed at [facility], [address], [place], [place] [address]    Report Status 06/13/2022 FINAL  Final  Blood Culture (routine x 2)     Status: None   Collection Time: 06/08/22  7:18 PM   Specimen: BLOOD  Result Value Ref Range Status   Specimen Description BLOOD BLOOD RIGHT ARM  Final   Special Requests   Final    BOTTLES DRAWN AEROBIC AND ANAEROBIC Blood Culture adequate volume   Culture   Final    NO GROWTH 5 DAYS Performed at [facility], [address] [place], [place] [address]    Report Status 06/13/2022 FINAL  Final  Resp panel by RT-PCR (RSV, Flu A&B, Covid) Anterior Nasal Swab     Status: None   Collection Time: 06/08/22  7:18 PM   Specimen: Anterior Nasal Swab  Result Value Ref Range Status   SARS Coronavirus 2 by RT PCR NEGATIVE NEGATIVE Final    Comment: (NOTE) SARS-CoV-2 target nucleic acids are NOT DETECTED.  The SARS-CoV-2 RNA is generally detectable in upper respiratory specimens during the acute phase of infection. The lowest concentration of SARS-CoV-2 viral copies this assay can detect is 138 copies/mL. A negative result does not preclude SARS-Cov-2 infection and should not be used as the sole basis for treatment or other patient management decisions. A negative result may occur with  improper specimen collection/handling, submission of specimen other than nasopharyngeal swab, presence of viral mutation(s) within the areas targeted by this assay, and inadequate number of viral copies(<138 copies/mL). A negative result must be combined with clinical observations, patient history, and epidemiological information. The expected result is Negative.  Fact Sheet for Patients:  EntrepreneurPulse.com.au  Fact Sheet for Healthcare Providers:  IncredibleEmployment.be  This test is no t yet approved or cleared by the [place] FDA and   has been authorized for detection and/or diagnosis of SARS-CoV-2 by FDA under an Emergency Use Authorization (EUA). This EUA will remain  in effect (meaning this test can be used) for the duration of the COVID-19 declaration under Section 564(b)(1) of the Act, 21 U.S.C.section 360bbb-3(b)(1), unless the authorization is terminated  or revoked sooner.       Influenza A by PCR NEGATIVE NEGATIVE Final   Influenza B by PCR NEGATIVE NEGATIVE Final    Comment: (NOTE) The Xpert Xpress SARS-CoV-2/FLU/RSV plus assay is intended as an aid in the diagnosis of influenza from Nasopharyngeal swab specimens and should not be used as a sole basis for treatment. Nasal washings and aspirates are unacceptable for Xpert  Xpress SARS-CoV-2/FLU/RSV testing.  Fact Sheet for Patients: EntrepreneurPulse.com.au  Fact Sheet for Healthcare Providers: IncredibleEmployment.be  This test is not yet approved or cleared by the [place] FDA and has been authorized for detection and/or diagnosis of SARS-CoV-2 by FDA under an Emergency Use Authorization (EUA). This EUA will remain in effect (meaning this test can be used) for the duration of the COVID-19 declaration under Section 564(b)(1) of the Act, 21 U.S.C. section 360bbb-3(b)(1), unless the authorization is terminated or revoked.     Resp Syncytial Virus by PCR NEGATIVE NEGATIVE Final    Comment: (NOTE) Fact Sheet for Patients: EntrepreneurPulse.com.au  Fact Sheet for Healthcare Providers: IncredibleEmployment.be  This test is not yet approved or cleared by the [place] FDA and has been authorized for detection and/or diagnosis of SARS-CoV-2 by FDA under an Emergency Use Authorization (EUA). This EUA will remain in effect (meaning this test can be used) for the duration of the COVID-19 declaration under Section 564(b)(1) of the Act, 21 U.S.C. section 360bbb-3(b)(1),  unless the authorization is terminated or revoked.  Performed at [facility], [address], [place], [place] [address]   Urine Culture     Status: None   Collection Time: 06/08/22  9:34 PM   Specimen: In/Out Cath Urine  Result Value Ref Range Status   Specimen Description   Final    IN/OUT CATH URINE Performed at [facility], [address], [place], [place] [address]    Special Requests   Final    NONE Performed at [facility], [address], [place], [place] [address]    Culture   Final    NO GROWTH Performed at [facility], [place] [address] [place], [place] [address]    Report Status 06/10/2022 FINAL  Final  MRSA Next Gen by PCR, Nasal     Status: None   Collection Time: 06/10/22 11:49 PM   Specimen: Nasal Mucosa; Nasal Swab  Result Value Ref Range Status   MRSA by PCR Next Gen NOT DETECTED NOT DETECTED Final    Comment: (NOTE) The GeneXpert MRSA Assay (FDA approved for NASAL specimens only), is one component of a comprehensive MRSA colonization surveillance program. It is not intended to diagnose MRSA infection nor to guide or monitor treatment for MRSA infections. Test performance is not FDA approved in patients less than 12 years old. Performed at [facility], [place],  [address]   Respiratory (~20 pathogens) panel by PCR     Status: None   Collection Time: 06/12/22  6:15 AM   Specimen: Nasopharyngeal Swab; Respiratory  Result Value Ref Range Status   Adenovirus NOT DETECTED NOT DETECTED Final   Coronavirus 229E NOT DETECTED NOT DETECTED Final    Comment: (NOTE) The Coronavirus on the Respiratory Panel, DOES NOT test for the novel  Coronavirus (2019 nCoV)    Coronavirus HKU1 NOT DETECTED NOT DETECTED Final   Coronavirus NL63 NOT DETECTED NOT DETECTED Final   Coronavirus OC43 NOT DETECTED NOT DETECTED Final   Metapneumovirus NOT DETECTED NOT DETECTED Final   Rhinovirus / Enterovirus NOT  DETECTED NOT DETECTED Final   Influenza A NOT DETECTED NOT DETECTED Final   Influenza B NOT DETECTED NOT DETECTED Final   Parainfluenza Virus 1 NOT DETECTED NOT DETECTED Final   Parainfluenza Virus 2 NOT DETECTED NOT DETECTED Final   Parainfluenza Virus 3 NOT DETECTED NOT DETECTED Final   Parainfluenza Virus 4 NOT DETECTED NOT DETECTED Final   Respiratory  Syncytial Virus NOT DETECTED NOT DETECTED Final   Bordetella pertussis NOT DETECTED NOT DETECTED Final   Bordetella Parapertussis NOT DETECTED NOT DETECTED Final   Chlamydophila pneumoniae NOT DETECTED NOT DETECTED Final   Mycoplasma pneumoniae NOT DETECTED NOT DETECTED Final    Comment: Performed at [facility], [place] [address], [place], [place] [address]  Culture, Respiratory w Gram Stain     Status: None   Collection Time: 06/17/22  8:15 AM   Specimen: Tracheal Aspirate; Respiratory  Result Value Ref Range Status   Specimen Description   Final    TRACHEAL ASPIRATE Performed at [facility], [address] [place], [place] [address]    Special Requests   Final    NONE Performed at [facility], [address] [place], [place] [address]    Gram Stain   Final    RARE WBC PRESENT, PREDOMINANTLY PMN RARE SQUAMOUS EPITHELIAL CELLS PRESENT FEW YEAST Performed at [facility], [place] [address] [place], [place] [address]    Culture FEW CANDIDA ALBICANS  Final   Report Status 06/19/2022 FINAL  Final    Coagulation Studies: No results for input(s): "LABPROT", "INR" in the last 72 hours.  Urinalysis: No results for input(s): "COLORURINE", "LABSPEC", "PHURINE", "GLUCOSEU", "HGBUR", "BILIRUBINUR", "KETONESUR", "PROTEINUR", "UROBILINOGEN", "NITRITE", "LEUKOCYTESUR" in the last 72 hours.  Invalid input(s): "APPERANCEUR"    Imaging: No results found.   Medications:      budesonide (PULMICORT) nebulizer solution  0.5 mg Nebulization BID   Chlorhexidine Gluconate Cloth  6 each Topical Daily    darbepoetin (ARANESP) injection - NON-DIALYSIS  60 mcg Subcutaneous Q Tue-1800   feeding supplement  237 mL Oral TID BM   fluocinonide-emollient   Topical QODAY   Gerhardt's butt cream   Topical TID   heparin  5,000 Units Subcutaneous Q8H   ipratropium-albuterol  3 mL Nebulization BID   multivitamin with minerals  1 tablet Oral Daily   pantoprazole (PROTONIX) IV  40 mg Intravenous Q24H   docusate sodium, ipratropium-albuterol, lip balm, ondansetron **OR** ondansetron (ZOFRAN) IV, mouth rinse, mouth rinse, phenol  Assessment/ Plan:  Ms. Samantha Mason is a 72 y.o.  female  with past medical history including COPD with renal failure requiring 2 L oxygen as needed, chronic diastolic heart failure, sleep apnea, morbid obesity, hypertension, and chronic kidney disease stage IV. Patient presents to the emergency department feeling sick and was admitted for Sepsis [facility]) [A41.9] Cellulitis, unspecified cellulitis site [L03.90] Community acquired pneumonia, unspecified laterality [J18.9] Sepsis, due to unspecified organism, unspecified whether acute organ dysfunction present [facility]) [A41.9]   Acute Kidney Injury on chronic kidney disease stage IIIb with baseline creatinine 1.55 and GFR of 36 on 04/17/22.  Acute kidney injury secondary to severe infection. No IV contrast exposure.  Diuretics remain held. Lower extremity edema present, however patient has history of lymphedema.   Creatinine continue to rise.  Discussed with patient the possible need for dialysis, which she is agreeable to if needed.  Will evaluate labs in a.m. along with urine output and determine if this is necessary.   Lab Results  Component Value Date   CREATININE 3.49 (H) 06/26/2022   CREATININE 3.12 (H) 06/25/2022   CREATININE 2.62 (H) 06/24/2022   No intake or output data in the 24 hours ending 06/26/22 1334  2. Anemia of chronic kidney disease Lab Results  Component Value Date   HGB 7.7 (L) 06/26/2022    Continue  weekly Aranesp  3.  Chronic diastolic heart failure.  Echo from 07/16/21 shows EF 55 to 60% with mild LVH.  Diuretics remain held.  4.  Acute respiratory failure.  Weaned to 2L , continue supportive care   LOS: [place] 2/7/20241:34 PM

## 2022-06-26 NOTE — Progress Notes (Signed)
Physical Therapy Treatment Patient Details Name: Samantha Mason MRN: [id number] DOB: 03-20-1951 Today's Date: 06/26/2022   History of Present Illness 72 y/o female with h/o morbid obesity, OSA, HTN, COPD, CHF, CKD IV, GERD, IBS, RLS and recent admission for SBO (medically managed) who is admitted with CHF, sepsis, UTI, AKI and cellulitis. Pt extubated on 1/31.    PT Comments    Patient received in bed, she is agreeable to PT session. Son present at bedside. She requires encouragement and cues to initiate movement. Increased time needed with all activities. Patient reporting she feels so short winded, O2 sats at 96% throughout session. Patient required mod +2 for supine>< sit. Attempted scooting in sitting toward head of bed. Not very successful due to weakness. Patient will continue to benefit from skilled PT to improve strength, endurance and independence.       Recommendations for follow up therapy are one component of a multi-disciplinary discharge planning process, led by the attending physician.  Recommendations may be updated based on patient status, additional functional criteria and insurance authorization.  Follow Up Recommendations  Skilled nursing-short term rehab (<3 hours/day)     Assistance Recommended at Discharge Frequent or constant Supervision/Assistance  Patient can return home with the following Two people to help with walking and/or transfers;Two people to help with bathing/dressing/bathroom   Equipment Recommendations  None recommended by PT    Recommendations for Other Services       Precautions / Restrictions Precautions Precautions: Fall Restrictions Weight Bearing Restrictions: No     Mobility  Bed Mobility Overal bed mobility: Needs Assistance Bed Mobility: Supine to Sit, Sit to Supine     Supine to sit: Mod assist, +2 for physical assistance Sit to supine: Mod assist, +2 for physical assistance   General bed mobility comments: Patient requires  cues to use bed rail and assist as able. Patient attempted to scoot in sitting up toward [facility] with max assist. Not able to scoot much. Able to sit unsupported x several minutes (10)    Transfers                   General transfer comment: unable at this time due to fatigue    Ambulation/Gait               General Gait Details: unable   Stairs             Wheelchair Mobility    Modified Rankin (Stroke Patients Only)       Balance Overall balance assessment: Needs assistance Sitting-balance support: Feet supported Sitting balance-Leahy Scale: Good                                      Cognition Arousal/Alertness: Awake/alert Behavior During Therapy: Flat affect Overall Cognitive Status: Within Functional Limits for tasks assessed                                 General Comments: slow to respond/move at times        Exercises Other Exercises Other Exercises: LAQ in sitting x 10 reps each    General Comments        Pertinent Vitals/Pain Pain Assessment Pain Assessment: Faces Faces Pain Scale: Hurts even more Pain Location: vagina Pain Descriptors / Indicators: Discomfort, Sore Pain Intervention(s): Monitored during session, Other (comment) (notified RN  and NT, removed pure wick and it was bloody.)    Home Living                          Prior Function            PT Goals (current goals can now be found in the care plan section) Acute Rehab PT Goals Patient Stated Goal: to decrease pain in perineal area PT Goal Formulation: With patient Time For Goal Achievement: 07/04/22 Progress towards PT goals: Progressing toward goals    Frequency    Min 2X/week      PT Plan Current plan remains appropriate    Co-evaluation              AM-PAC PT "6 Clicks" Mobility   Outcome Measure  Help needed turning from your back to your side while in a flat bed without using bedrails?: A Lot Help  needed moving from lying on your back to sitting on the side of a flat bed without using bedrails?: A Lot Help needed moving to and from a bed to a chair (including a wheelchair)?: Total Help needed standing up from a chair using your arms (e.g., wheelchair or bedside chair)?: Total Help needed to walk in hospital room?: Total Help needed climbing 3-5 steps with a railing? : Total 6 Click Score: 8    End of Session Equipment Utilized During Treatment: Oxygen Activity Tolerance: Patient limited by fatigue Patient left: in bed;with bed alarm set;with family/visitor present Nurse Communication: Mobility status PT Visit Diagnosis: Muscle weakness (generalized) (M62.81);Other abnormalities of gait and mobility (R26.89);Pain Pain - part of body:  (vagina)     Time: 7341-9379 PT Time Calculation (min) (ACUTE ONLY): 35 min  Charges:  $Therapeutic Exercise: 8-22 mins $Therapeutic Activity: 8-22 mins                     Nylene Inlow, PT, GCS 06/26/22,3:18 PM

## 2022-06-26 NOTE — Progress Notes (Signed)
Palliative Care Progress Note, Assessment & Plan   Patient Name: Samantha Mason       Date: 06/26/2022 DOB: Oct 26, 1950  Age: 72 y.o. MRN#: [id number] Attending Physician: Emeterio Reeve, DO Primary Care Physician: Glean Hess, MD Admit Date: 06/08/2022  Subjective: Patient is sitting up in bed, sipping on water. She acknowledges my presence and it able to make her wishes known. She has no acute complaints of pain or discomfort. No family/friends at bedside.   HPI: 72 y.o. female  with past medical history of morbid obesity, chronic HFpEF, CKD (stage III), chronic LE lymphedema (treatment of torsemide), HTN, asthma, stroke (1995), and OSA (noncompliant with CPAP) admitted on 06/08/2022 with feeling unwell for approximately 1 week, chills, generalized weakness, and fatigue.   Patient is being treated for sepsis in setting of lower extremity cellulitis, AKI, and acute on chronic hypercapnic respiratory failure in the setting of AECOPD.   1/23, patient was transferred to ICU due to CO2 narcosis and placed on BiPAP.  Patient is having worsening AKI as well as agitation requiring Precedex.  Patient is at high risk for intubation.  PMT was consulted to discuss goals of care. 1/25 Intubated 1/31 extubated and DNR/DNI confirmed 2/1 failed SLP and declining NG tube  Summary of counseling/coordination of care: After reviewing the patient's chart and assessing the patient at bedside, I discussed plan and GOC with patient. We reviewed plans for d/c to SNF for rehab. SHe remains in agreement to go to rehab if needed.  Functional, cognitive, and nutritional status discussed with pt. She maintains she would just like to eat pudding and ice cream.  She also shares it has been difficult but she is willing to work  with PT and OT in order to get stronger.  In light of patient likely going to SNF for rehab, I reviewed MOST form with her.  She confirmed her wishes to be the following:  Cardiopulmonary Resuscitation: Do Not Attempt Resuscitation (DNR/No CPR)  Medical Interventions: Limited Additional Interventions: Use medical treatment, IV fluids and cardiac monitoring as indicated, DO NOT USE intubation or mechanical ventilation. May consider use of less invasive airway support such as BiPAP or CPAP. Also provide comfort measures. Transfer to the hospital if indicated. Avoid intensive care.   Antibiotics: Antibiotics if indicated  IV Fluids: IV fluids if indicated  Feeding Tube: No feeding tube    Patient was clear with me today that she would never want to have a feeding tube.  However, this is contrary to patient's previous discussions were and she said that if it was absolutely necessary then she would be accepting of a feeding tube.  MOST form not completed today as ongoing and further clarification of goals as needed.  PMT will continue to follow.  Patient also does not have her glasses and is unable to clearly read all words on the form.  She is requested that her son [name] bring her glasses to the hospital.  Physical Exam Constitutional:      General: She is not in acute distress.    Appearance: She is obese.  HENT:     Head: Normocephalic.     Mouth/Throat:     Mouth:  Mucous membranes are moist.  Eyes:     Pupils: Pupils are equal, round, and reactive to light.  Cardiovascular:     Rate and Rhythm: Normal rate.     Pulses: Normal pulses.  Pulmonary:     Effort: Pulmonary effort is normal.  Abdominal:     Palpations: Abdomen is soft.  Musculoskeletal:     Comments: Generalized weakness  Skin:    General: Skin is warm and dry.     Comments: LE dressings clean, dry, and intact  Neurological:     Mental Status: She is alert and oriented to person, place, and time.  Psychiatric:         Mood and Affect: Mood normal.        Behavior: Behavior normal.             Total Time 35 minutes   Mammie Meras L. Ilsa Iha, FNP-BC Palliative Medicine Team Team Phone # [phone]

## 2022-06-27 ENCOUNTER — Inpatient Hospital Stay: Payer: Medicare Other

## 2022-06-27 DIAGNOSIS — L0390 Cellulitis, unspecified: Secondary | ICD-10-CM | POA: Diagnosis not present

## 2022-06-27 DIAGNOSIS — E662 Morbid (severe) obesity with alveolar hypoventilation: Secondary | ICD-10-CM | POA: Diagnosis not present

## 2022-06-27 DIAGNOSIS — J189 Pneumonia, unspecified organism: Secondary | ICD-10-CM | POA: Diagnosis not present

## 2022-06-27 DIAGNOSIS — A419 Sepsis, unspecified organism: Secondary | ICD-10-CM | POA: Diagnosis not present

## 2022-06-27 LAB — BASIC METABOLIC PANEL
Anion gap: 9 (ref 5–15)
BUN: 71 mg/dL — ABNORMAL HIGH (ref 8–23)
CO2: 27 mmol/L (ref 22–32)
Calcium: 8.6 mg/dL — ABNORMAL LOW (ref 8.9–10.3)
Chloride: 103 mmol/L (ref 98–111)
Creatinine, Ser: 3.59 mg/dL — ABNORMAL HIGH (ref 0.44–1.00)
GFR, Estimated: 13 mL/min — ABNORMAL LOW (ref >60)
Glucose, Bld: 108 mg/dL — ABNORMAL HIGH (ref 70–99)
Potassium: 3.5 mmol/L (ref 3.5–5.1)
Sodium: 139 mmol/L (ref 135–145)

## 2022-06-27 LAB — CBC
HCT: 26.3 % — ABNORMAL LOW (ref 36.0–46.0)
Hemoglobin: 7.5 g/dL — ABNORMAL LOW (ref 12.0–15.0)
MCH: 23.8 pg — ABNORMAL LOW (ref 26.0–34.0)
MCHC: 28.5 g/dL — ABNORMAL LOW (ref 30.0–36.0)
MCV: 83.5 fL (ref 80.0–100.0)
Platelets: 199 10*3/uL (ref 150–400)
RBC: 3.15 MIL/uL — ABNORMAL LOW (ref 3.87–5.11)
RDW: 18 % — ABNORMAL HIGH (ref 11.5–15.5)
WBC: 7.4 10*3/uL (ref 4.0–10.5)
nRBC: 0 % (ref 0.0–0.2)

## 2022-06-27 LAB — GASTROINTESTINAL PANEL BY PCR, STOOL (REPLACES STOOL CULTURE)

## 2022-06-27 LAB — MAGNESIUM: Magnesium: 2.4 mg/dL (ref 1.7–2.4)

## 2022-06-27 NOTE — Progress Notes (Signed)
Nutrition Follow-up  DOCUMENTATION CODES:   Morbid obesity  INTERVENTION:   -Continue Ensure Enlive po TID, each supplement provides 350 kcal and 20 grams of protein.  -Continue MVI with minerals daily  NUTRITION DIAGNOSIS:   Inadequate oral intake related to inability to eat (pt sedated and ventilated) as evidenced by NPO status.  Progressing; advanced to PO diet on 06/23/22   GOAL:   Patient will meet greater than or equal to 90% of their needs  Progressing   MONITOR:   Diet advancement, Labs, Weight trends, Skin, I & O's  REASON FOR ASSESSMENT:   Consult Enteral/tube feeding initiation and management  ASSESSMENT:   72 y/o female with h/o morbid obesity, OSA, HTN, COPD, CHF, CKD IV, GERD, IBS, RLS and recent admission for SBO (medically managed) who is admitted with CHF, sepsis, UTI, AKI and cellulitis.  1/31- extubated 2/1- s/p BSE- ice chips for comfort 2/4- s/p BSE- advanced to dysphagia 1 diet with nectar thick liquids 2/5- s/p BSE- advanced to dysphagia 3 diet with thin liquids  Reviewed I/O's: -200 ml x 24 hours and -9.3 L since 06/13/22  UOP: 200 ml x 24 hours  Pt remains on a dysphagia 3 diet with thin liquids. Pt with poor oral intake. Meal completions 25-50%. Pt has been refusing Ensure supplements.   Per palliative care notes, goals of care are clear. Pt continues to refuse feeding tube. Per MD notes, pt is DNR/DNI and does not want to be placed on ventilator support again.   Plan to d/c to SNF once medically stable.    Wt has been stable over the past week.   Labs reviewed: CBGS: 93.   Diet Order:   Diet Order             DIET DYS 3 Room service appropriate? Yes; Fluid consistency: Thin  Diet effective now                   EDUCATION NEEDS:   No education needs have been identified at this time  Skin:  Skin Assessment: Reviewed RN Assessment  Last BM:  06/26/22  Height:   Ht Readings from Last 1 Encounters:  06/09/22 '5\' 4"'$  (1.626  m)    Weight:   Wt Readings from Last 1 Encounters:  06/27/22 (!) 145.7 kg    Ideal Body Weight:  54.5 kg  BMI:  Body mass index is 55.14 kg/m.  Estimated Nutritional Needs:   Kcal:  1900-2100  Protein:  110-125 grams  Fluid:  > 1.9 L    Loistine Chance, RD, LDN, [place] Registered Dietitian II Certified Diabetes Care and Education Specialist Please refer to [facility] for RD and/or RD on-call/weekend/after hours pager

## 2022-06-27 NOTE — Progress Notes (Signed)
Physical Therapy Treatment Patient Details Name: Samantha Mason MRN: [id number] DOB: [birth year] Today's Date: 06/27/2022   History of Present Illness 72 y/o female with h/o morbid obesity, OSA, HTN, COPD, CHF, CKD IV, GERD, IBS, RLS and recent admission for SBO (medically managed) who is admitted with CHF, sepsis, UTI, AKI and cellulitis. Pt extubated on 1/31.    PT Comments    Author was contacted by RN staff with pt requesting to get OOB to recliner. Upon arriving to room, pt is agreeable to session and was wanting to get OOB for lunch. She required extensive +1 assistance to achieve EOB short sitting. Once seated EOB, was able to maintain balance with supervision only. Progressed to standing from slightly elevated bed height to RW with mod assist. On 2nd STS, pt progressing to taking several slow, labored steps to recliner. Increased WOB/ fatigued noted. Pt required prolonged recovery time. Pt is severely deconditioned. Will continue to benefit from skilled PT at DC to maximize her independence while assisting pt to PLOF.    Recommendations for follow up therapy are one component of a multi-disciplinary discharge planning process, led by the attending physician.  Recommendations may be updated based on patient status, additional functional criteria and insurance authorization.  Follow Up Recommendations  Skilled nursing-short term rehab (<3 hours/day)     Assistance Recommended at Discharge Frequent or constant Supervision/Assistance  Patient can return home with the following Two people to help with walking and/or transfers;Two people to help with bathing/dressing/bathroom   Equipment Recommendations  None recommended by PT       Precautions / Restrictions Precautions Precautions: Fall Restrictions Weight Bearing Restrictions: No     Mobility  Bed Mobility Overal bed mobility: Needs Assistance Bed Mobility: Supine to Sit Rolling: Max assist Supine to sit: Mod assist, Max  assist  General bed mobility comments: pt was able to achieve EOB short sit with increased time + mod-max assist of one    Transfers Overall transfer level: Needs assistance Equipment used: Rolling walker (2 wheels) Transfers: Sit to/from Stand Sit to Stand: Mod assist, From elevated surface  General transfer comment: pt stood 2 x from slightly elevated bed height. Vcs for handfplace,ent and increased fwd wt shift when going from sitting> standing. 2nd attempt, pt stood and took ~ 5 steps to recliner from elevated bed height    Ambulation/Gait Ambulation/Gait assistance: Mod assist Gait Distance (Feet): 3 Feet Assistive device: Rolling walker (2 wheels) Gait Pattern/deviations: Step-to pattern Gait velocity: decreased     General Gait Details: pt was able to progress BLEs to clear floor while taking small steps to recliner. poor eccentric loweriung to sitting in recliner 2/2 to fatigue with limited activity. HR elevated in 120s with WOB increased. RR into low 30s but with prolonged rest was able to recover. hoyer pad was placed under pt in recliner for assisting RN staff with getting back to bed after lunch    Balance Overall balance assessment: Needs assistance Sitting-balance support: Feet supported Sitting balance-Leahy Scale: Good     Standing balance support: Bilateral upper extremity supported, During functional activity, Reliant on assistive device for balance Standing balance-Leahy Scale: Poor Standing balance comment: very limited standing tolerance      Cognition Arousal/Alertness: Awake/alert Behavior During Therapy: WFL for tasks assessed/performed Overall Cognitive Status: Within Functional Limits for tasks assessed    General Comments: Pt is A but only oriented x 2. Unaware of full scope of current situation. Reoriented pt and she seemed to have improved  orientation throughout remainder of session               Pertinent Vitals/Pain Pain Assessment Pain  Assessment: No/denies pain Pain Score: 0-No pain     PT Goals (current goals can now be found in the care plan section) Acute Rehab PT Goals Patient Stated Goal: get stronger Progress towards PT goals: Progressing toward goals    Frequency    Min 2X/week      PT Plan Current plan remains appropriate       AM-PAC PT "6 Clicks" Mobility   Outcome Measure  Help needed turning from your back to your side while in a flat bed without using bedrails?: A Lot Help needed moving from lying on your back to sitting on the side of a flat bed without using bedrails?: A Lot Help needed moving to and from a bed to a chair (including a wheelchair)?: A Lot Help needed standing up from a chair using your arms (e.g., wheelchair or bedside chair)?: A Lot Help needed to walk in hospital room?: A Lot Help needed climbing 3-5 steps with a railing? : Total 6 Click Score: 11    End of Session Equipment Utilized During Treatment: Oxygen Activity Tolerance: Patient limited by fatigue Patient left: in chair;with call bell/phone within reach;with chair alarm set Nurse Communication: Mobility status PT Visit Diagnosis: Muscle weakness (generalized) (M62.81);Other abnormalities of gait and mobility (R26.89);Pain Pain - Right/Left: Left Pain - part of body: Leg     Time: 4734-0370 PT Time Calculation (min) (ACUTE ONLY): 13 min  Charges:  $Therapeutic Activity: 8-22 mins                     Julaine Fusi PTA 06/27/22, 3:31 PM

## 2022-06-27 NOTE — Progress Notes (Signed)
[facility]  ROUNDING NOTE   Subjective:   Samantha Mason  is a 72 year old female with past medical history including COPD with renal failure requiring 2 L oxygen as needed, chronic diastolic heart failure, sleep apnea, morbid obesity, hypertension, and chronic kidney disease stage IV. Patient presents to the emergency department feeling sick for a few days.  She was admitted for Sepsis [facility]) [A41.9] Cellulitis, unspecified cellulitis site [L03.90] Community acquired pneumonia, unspecified laterality [J18.9] Sepsis, due to unspecified organism, unspecified whether acute organ dysfunction present [facility]) [A41.9]  Patient is followed by our office outpatient. She was last seen in office on Apr 17, 2022.   Patient seen laying in bed Alert and oriented Reports soreness in her feet Edema improved in lower extremties   02/07 0701 - 02/08 0700 In: -  Out: 200 [Urine:200] Lab Results  Component Value Date   CREATININE 3.59 (H) 06/27/2022   CREATININE 3.49 (H) 06/26/2022   CREATININE 3.12 (H) 06/25/2022      Objective:  Vital signs in last 24 hours:  Temp:  [98.1 F (36.7 C)-98.8 F (37.1 C)] 98.1 F (36.7 C) (02/08 0907) Pulse Rate:  [89-106] 97 (02/08 0907) Resp:  [18-29] 18 (02/08 0907) BP: (114-156)/(55-65) 143/55 (02/08 0907) SpO2:  [96 %-100 %] 96 % (02/08 0907) Weight:  [145.7 kg] 145.7 kg (02/08 0500)  Weight change: -1 kg Filed Weights   06/25/22 0500 06/26/22 0500 06/27/22 0500  Weight: (!) 144 kg (!) 146.7 kg (!) 145.7 kg    Intake/Output: I/O last 3 completed shifts: In: -  Out: 200 [Urine:200]   Intake/Output this shift:  No intake/output data recorded.  Physical Exam: General: NAD  Head: Normocephalic, atraumatic.    Eyes: Anicteric  Lungs:  Clear to auscultation, Normal effort, [place] O2  Heart: Regular rate and rhythm  Abdomen:  Soft, nontender  Extremities: No peripheral edema.  Neurologic: Alert and oriented  Skin: No lesions   Access: No hemodialysis access    Basic Metabolic Panel: Recent Labs  Lab 06/21/22 0530 06/22/22 0455 06/23/22 0408 06/24/22 0358 06/25/22 0500 06/26/22 0434 06/27/22 0303  NA 158* 151* 150* 143 143 141 139  K 4.0 3.3* 3.6 3.3* 3.6 3.6 3.5  CL 112* 108 108 102 103 101 103  CO2 37* 33* '30 29 28 29 27  '$ GLUCOSE 111* 129* 110* 96 98 101* 108*  BUN 87* 94* 88* 78* 78* 77* 71*  CREATININE 1.83* 2.48* 2.58* 2.62* 3.12* 3.49* 3.59*  CALCIUM 9.1 9.1 9.0 8.7* 8.8* 9.0 8.6*  MG  --  2.3 2.2 1.8 2.6* 2.5* 2.4  PHOS 3.9 3.8 4.1 4.5  --   --   --      Liver Function Tests: Recent Labs  Lab 06/21/22 0530  ALBUMIN 2.6*    No results for input(s): "LIPASE", "AMYLASE" in the last 168 hours. No results for input(s): "AMMONIA" in the last 168 hours.   CBC: Recent Labs  Lab 06/22/22 0455 06/23/22 0408 06/24/22 0358 06/25/22 0500 06/26/22 0434 06/27/22 0303  WBC 11.8* 10.4 9.8 7.6 7.0 7.4  NEUTROABS 8.8* 7.9* 7.3  --   --   --   HGB 8.3* 8.5* 8.0* 7.7* 7.7* 7.5*  HCT 29.6* 29.9* 28.0* 26.5* 26.9* 26.3*  MCV 85.8 84.9 84.6 83.3 84.1 83.5  PLT 207 237 217 211 228 199     Cardiac Enzymes: No results for input(s): "CKTOTAL", "CKMB", "CKMBINDEX", "TROPONINI" in the last 168 hours.  BNP: Invalid input(s): "POCBNP"  CBG: Recent  Labs  Lab 06/23/22 2310 06/24/22 0323 06/24/22 0724 06/24/22 1147 06/24/22 1550  GLUCAP 162* 120* 116* 111* 55     Microbiology: Results for orders placed or performed during the hospital encounter of 06/08/22  Blood Culture (routine x 2)     Status: None   Collection Time: 06/08/22  7:18 PM   Specimen: BLOOD  Result Value Ref Range Status   Specimen Description BLOOD BLOOD LEFT ARM  Final   Special Requests   Final    BOTTLES DRAWN AEROBIC AND ANAEROBIC Blood Culture adequate volume   Culture   Final    NO GROWTH 5 DAYS Performed at [facility], [address] [place], [place] [address]    Report Status 06/13/2022 FINAL   Final  Blood Culture (routine x 2)     Status: None   Collection Time: 06/08/22  7:18 PM   Specimen: BLOOD  Result Value Ref Range Status   Specimen Description BLOOD BLOOD RIGHT ARM  Final   Special Requests   Final    BOTTLES DRAWN AEROBIC AND ANAEROBIC Blood Culture adequate volume   Culture   Final    NO GROWTH 5 DAYS Performed at [facility], [address] [place], [place] [address]    Report Status 06/13/2022 FINAL  Final  Resp panel by RT-PCR (RSV, Flu A&B, Covid) Anterior Nasal Swab     Status: None   Collection Time: 06/08/22  7:18 PM   Specimen: Anterior Nasal Swab  Result Value Ref Range Status   SARS Coronavirus 2 by RT PCR NEGATIVE NEGATIVE Final    Comment: (NOTE) SARS-CoV-2 target nucleic acids are NOT DETECTED.  The SARS-CoV-2 RNA is generally detectable in upper respiratory specimens during the acute phase of infection. The lowest concentration of SARS-CoV-2 viral copies this assay can detect is 138 copies/mL. A negative result does not preclude SARS-Cov-2 infection and should not be used as the sole basis for treatment or other patient management decisions. A negative result may occur with  improper specimen collection/handling, submission of specimen other than nasopharyngeal swab, presence of viral mutation(s) within the areas targeted by this assay, and inadequate number of viral copies(<138 copies/mL). A negative result must be combined with clinical observations, patient history, and epidemiological information. The expected result is Negative.  Fact Sheet for Patients:  EntrepreneurPulse.com.au  Fact Sheet for Healthcare Providers:  IncredibleEmployment.be  This test is no t yet approved or cleared by the [place] FDA and  has been authorized for detection and/or diagnosis of SARS-CoV-2 by FDA under an Emergency Use Authorization (EUA). This EUA will remain  in effect (meaning this test can be  used) for the duration of the COVID-19 declaration under Section 564(b)(1) of the Act, 21 U.S.C.section 360bbb-3(b)(1), unless the authorization is terminated  or revoked sooner.       Influenza A by PCR NEGATIVE NEGATIVE Final   Influenza B by PCR NEGATIVE NEGATIVE Final    Comment: (NOTE) The Xpert Xpress SARS-CoV-2/FLU/RSV plus assay is intended as an aid in the diagnosis of influenza from Nasopharyngeal swab specimens and should not be used as a sole basis for treatment. Nasal washings and aspirates are unacceptable for Xpert Xpress SARS-CoV-2/FLU/RSV testing.  Fact Sheet for Patients: EntrepreneurPulse.com.au  Fact Sheet for Healthcare Providers: IncredibleEmployment.be  This test is not yet approved or cleared by the [place] FDA and has been authorized for detection and/or diagnosis of SARS-CoV-2 by FDA under an Emergency Use Authorization (EUA). This EUA will remain  in effect (meaning this test can be used) for the duration of the COVID-19 declaration under Section 564(b)(1) of the Act, 21 U.S.C. section 360bbb-3(b)(1), unless the authorization is terminated or revoked.     Resp Syncytial Virus by PCR NEGATIVE NEGATIVE Final    Comment: (NOTE) Fact Sheet for Patients: EntrepreneurPulse.com.au  Fact Sheet for Healthcare Providers: IncredibleEmployment.be  This test is not yet approved or cleared by the [place] FDA and has been authorized for detection and/or diagnosis of SARS-CoV-2 by FDA under an Emergency Use Authorization (EUA). This EUA will remain in effect (meaning this test can be used) for the duration of the COVID-19 declaration under Section 564(b)(1) of the Act, 21 U.S.C. section 360bbb-3(b)(1), unless the authorization is terminated or revoked.  Performed at [facility], [address] [place], [place] [address]   Urine Culture     Status: None    Collection Time: 06/08/22  9:34 PM   Specimen: In/Out Cath Urine  Result Value Ref Range Status   Specimen Description   Final    IN/OUT CATH URINE Performed at [facility], [address] [place], [place] [address]    Special Requests   Final    NONE Performed at [facility], [address], [place], [place] [address]    Culture   Final    NO GROWTH Performed at [facility], [place] [address], [place], [place] [address]    Report Status 06/10/2022 FINAL  Final  MRSA Next Gen by PCR, Nasal     Status: None   Collection Time: 06/10/22 11:49 PM   Specimen: Nasal Mucosa; Nasal Swab  Result Value Ref Range Status   MRSA by PCR Next Gen NOT DETECTED NOT DETECTED Final    Comment: (NOTE) The GeneXpert MRSA Assay (FDA approved for NASAL specimens only), is one component of a comprehensive MRSA colonization surveillance program. It is not intended to diagnose MRSA infection nor to guide or monitor treatment for MRSA infections. Test performance is not FDA approved in patients less than 75 years old. Performed at [facility], [address] [place],  [address]   Respiratory (~20 pathogens) panel by PCR     Status: None   Collection Time: 06/12/22  6:15 AM   Specimen: Nasopharyngeal Swab; Respiratory  Result Value Ref Range Status   Adenovirus NOT DETECTED NOT DETECTED Final   Coronavirus 229E NOT DETECTED NOT DETECTED Final    Comment: (NOTE) The Coronavirus on the Respiratory Panel, DOES NOT test for the novel  Coronavirus (2019 nCoV)    Coronavirus HKU1 NOT DETECTED NOT DETECTED Final   Coronavirus NL63 NOT DETECTED NOT DETECTED Final   Coronavirus OC43 NOT DETECTED NOT DETECTED Final   Metapneumovirus NOT DETECTED NOT DETECTED Final   Rhinovirus / Enterovirus NOT DETECTED NOT DETECTED Final   Influenza A NOT DETECTED NOT DETECTED Final   Influenza B NOT DETECTED NOT DETECTED Final   Parainfluenza Virus 1 NOT DETECTED NOT DETECTED  Final   Parainfluenza Virus 2 NOT DETECTED NOT DETECTED Final   Parainfluenza Virus 3 NOT DETECTED NOT DETECTED Final   Parainfluenza Virus 4 NOT DETECTED NOT DETECTED Final   Respiratory Syncytial Virus NOT DETECTED NOT DETECTED Final   Bordetella pertussis NOT DETECTED NOT DETECTED Final   Bordetella Parapertussis NOT DETECTED NOT DETECTED Final   Chlamydophila pneumoniae NOT DETECTED NOT DETECTED Final   Mycoplasma pneumoniae NOT DETECTED NOT DETECTED Final    Comment: Performed at [facility], [address] [place], [place] [address]  Culture, Respiratory w Gram Stain     Status: None   Collection Time: 06/17/22  8:15 AM   Specimen: Tracheal Aspirate; Respiratory  Result Value Ref Range Status   Specimen Description   Final    TRACHEAL ASPIRATE Performed at [facility], [address] [place], [place] [address]    Special Requests   Final    NONE Performed at [facility], [address] [place], [place] [address]    Gram Stain   Final    RARE WBC PRESENT, PREDOMINANTLY PMN RARE SQUAMOUS EPITHELIAL CELLS PRESENT FEW YEAST Performed at [facility], [place] [address], [place], [place] [address]    Culture FEW CANDIDA ALBICANS  Final   Report Status 06/19/2022 FINAL  Final    Coagulation Studies: No results for input(s): "LABPROT", "INR" in the last 72 hours.  Urinalysis: No results for input(s): "COLORURINE", "LABSPEC", "PHURINE", "GLUCOSEU", "HGBUR", "BILIRUBINUR", "KETONESUR", "PROTEINUR", "UROBILINOGEN", "NITRITE", "LEUKOCYTESUR" in the last 72 hours.  Invalid input(s): "APPERANCEUR"    Imaging: No results found.   Medications:      budesonide (PULMICORT) nebulizer solution  0.5 mg Nebulization BID   darbepoetin (ARANESP) injection - NON-DIALYSIS  60 mcg Subcutaneous Q Tue-1800   feeding supplement  237 mL Oral TID BM   fluocinonide-emollient   Topical QODAY   Gerhardt's butt cream   Topical TID   heparin  5,000 Units  Subcutaneous Q8H   ipratropium-albuterol  3 mL Nebulization BID   multivitamin with minerals  1 tablet Oral Daily   pantoprazole (PROTONIX) IV  40 mg Intravenous Q24H   docusate sodium, ipratropium-albuterol, lip balm, ondansetron **OR** ondansetron (ZOFRAN) IV, mouth rinse, mouth rinse, phenol  Assessment/ Plan:  Ms. Samantha Mason is a 72 y.o.  female  with past medical history including COPD with renal failure requiring 2 L oxygen as needed, chronic diastolic heart failure, sleep apnea, morbid obesity, hypertension, and chronic kidney disease stage IV. Patient presents to the emergency department feeling sick and was admitted for Sepsis [facility]) [A41.9] Cellulitis, unspecified cellulitis site [L03.90] Community acquired pneumonia, unspecified laterality [J18.9] Sepsis, due to unspecified organism, unspecified whether acute organ dysfunction present [facility]) [A41.9]   Acute Kidney Injury on chronic kidney disease stage IIIb with baseline creatinine 1.55 and GFR of 36 on 04/17/22.  Acute kidney injury secondary to severe infection. No IV contrast exposure.  Diuretics remain held. Lower extremity edema present, however patient has history of lymphedema.   Creatinine stable today, will continue to monitor UOP. No acute need for dialysis today. Continue to hold diuretics.    Lab Results  Component Value Date   CREATININE 3.59 (H) 06/27/2022   CREATININE 3.49 (H) 06/26/2022   CREATININE 3.12 (H) 06/25/2022    Intake/Output Summary (Last 24 hours) at 06/27/2022 1252 Last data filed at 06/27/2022 0656 Gross per 24 hour  Intake --  Output 200 ml  Net -200 ml    2. Anemia of chronic kidney disease Lab Results  Component Value Date   HGB 7.5 (L) 06/27/2022   Hgb remains decreased.  Continue weekly Aranesp.   3.  Chronic diastolic heart failure.  Echo from 07/16/21 shows EF 55 to 60% with mild LVH.  Diuretics remain held.  4.  Acute respiratory failure.  Weaned to 2L [place], continue supportive  care   LOS: [place] 2/8/202412:52 PM

## 2022-06-27 NOTE — Progress Notes (Signed)
Occupational Therapy Treatment Patient Details Name: Samantha Mason MRN: [id number] DOB: [birth year] Today's Date: 06/27/2022   History of present illness 72 y/o female with h/o morbid obesity, OSA, HTN, COPD, CHF, CKD IV, GERD, IBS, RLS and recent admission for SBO (medically managed) who is admitted with CHF, sepsis, UTI, AKI and cellulitis. Pt extubated on 1/31.   OT comments  Chart reviewed, pt greeted in chair agreeable to OT tx session. Tx session targeted improving functional activity tolerance in the setting of ADL task completion, initiation of HEP in order to improve participation in functional transfers. Pt performed therex as described below with step by step cueing for technique. Attempted STS with MAX A +1 with pt unable to clear buttocks off chair. Pt requires significant encouragement for participation in task with education provided re: importance of progressing mobility, functional status. Pt is left in bedside chair, set up for hoyer lift transfer back to bed if needed. OT will follow acutely.    Recommendations for follow up therapy are one component of a multi-disciplinary discharge planning process, led by the attending physician.  Recommendations may be updated based on patient status, additional functional criteria and insurance authorization.    Follow Up Recommendations  Skilled nursing-short term rehab (<3 hours/day)     Assistance Recommended at Discharge Intermittent Supervision/Assistance  Patient can return home with the following  Two people to help with walking and/or transfers;Two people to help with bathing/dressing/bathroom;Assistance with cooking/housework;Help with stairs or ramp for entrance;Assist for transportation;Direct supervision/assist for financial management;Direct supervision/assist for medications management   Equipment Recommendations  Other (comment) (per next venue of care)    Recommendations for Other Services      Precautions /  Restrictions Precautions Precautions: Fall Restrictions Weight Bearing Restrictions: No       Mobility Bed Mobility               General bed mobility comments: NT in chair pre/post session    Transfers Overall transfer level: Needs assistance                 General transfer comment: attempted STS from chair- requires MAX A +1 with RW to initiate, unable to clear buttocks     Balance Overall balance assessment: Needs assistance Sitting-balance support: Feet supported Sitting balance-Leahy Scale: Good     Standing balance support: Reliant on assistive device for balance, During functional activity, Bilateral upper extremity supported Standing balance-Leahy Scale: Zero                             ADL either performed or assessed with clinical judgement   ADL Overall ADL's : Needs assistance/impaired     Grooming: Wash/dry face;Sitting;Set up               Lower Body Dressing: Maximal assistance                      Extremity/Trunk Assessment              Vision       Perception     Praxis      Cognition Arousal/Alertness: Awake/alert Behavior During Therapy: WFL for tasks assessed/performed Overall Cognitive Status: Within Functional Limits for tasks assessed                                 General Comments: requires encouragement  for participation in all tasks, especially in regards to progressing strength/mobility        Exercises Other Exercises Other Exercises: Provided yellow theraband for improved BUE strength, performed band pull aparts 10x and reisted tricep extension bilatrally Other Exercises: trunk extension/flexion in recliner with use of RW to pull forwards 5x 2 sets    Shoulder Instructions       General Comments      Pertinent Vitals/ Pain       Pain Assessment Pain Assessment: No/denies pain  Home Living                                          Prior  Functioning/Environment              Frequency  Min 2X/week        Progress Toward Goals  OT Goals(current goals can now be found in the care plan section)  Progress towards OT goals: Progressing toward goals     Plan Frequency remains appropriate;Discharge plan needs to be updated    Co-evaluation                 AM-PAC OT "6 Clicks" Daily Activity     Outcome Measure   Help from another person eating meals?: A Little Help from another person taking care of personal grooming?: A Little Help from another person toileting, which includes using toliet, bedpan, or urinal?: Total Help from another person bathing (including washing, rinsing, drying)?: A Lot Help from another person to put on and taking off regular upper body clothing?: A Little Help from another person to put on and taking off regular lower body clothing?: A Lot 6 Click Score: 14    End of Session Equipment Utilized During Treatment: Oxygen  OT Visit Diagnosis: Muscle weakness (generalized) (M62.81);Unsteadiness on feet (R26.81)   Activity Tolerance Patient limited by fatigue   Patient Left in chair;with call bell/phone within reach   Nurse Communication Mobility status        Time: 6812-7517 OT Time Calculation (min): 22 min  Charges: OT General Charges $OT Visit: 1 Visit OT Treatments $Therapeutic Exercise: 8-22 mins Shanon Payor, OTD OTR/L  06/27/22, 2:41 PM

## 2022-06-27 NOTE — Progress Notes (Signed)
PROGRESS NOTE    Samantha Mason   EHU:[id number] DOB: 18-Aug-1950  DOA: 06/08/2022 Date of Service: 06/27/22 PCP: Glean Hess, MD     Brief Narrative / Hospital Course:  72 y.o. female with medical history significant for Morbid obesity, chronic HFpEF,CKD stage III, chronic lower extremity lymphedema on treatment with torsemide, hypertension, asthma, stroke (1995) and obstructive sleep apnea (non compliant with cpap) to ED w/ CC fatigue, LE swelling/redness. Pt was admitted Course complicated by CO2 narcosis, failed BiPAP requiring intubation and mechanical ventilation.  Transferred to hospitalist service on 06/24/22. Treated for Acute on Chronic HFpEF. Her respiratory failure has been treated with BiPAP, diuresis, and bronchodilators. She has been refusing BiPAP now. Currently on 2L [place]. Diuresis held, Cr worsens despite this, nephrology following.  1/20: Presented to ED, admitted by [facility] with Sepsis in setting of lower extremity Cellulitis, AKI, and Acute on Chronic Hypercapnic Respiratory Failure in setting AECOPD.  1/21: Added cefepime, continue vancomycin, increase dose of Cardizem for better heart rate control 1/22: Had to reduce Cardizem dose back to her home dose as blood pressure dropped 1/23: Transferred to ICU overnight due to CO2 narcosis, placed on BiPAP.  PCCM consulted.  Worsening AKI, holding diuresis.  Agitation requiring Precedex, high risk for Intubation.  Palliative Care consulted for [place] conversations.  ABX broadened back to Cefepime and Vancomycin 1/24: Pt on Bipap but awake and able to follow commands. Attempt to transition off Bipap.  Palliative Care following 1/25: Pt currently on Bipap and precedex gtt.  Required 2.5 mg iv valium overnight for delirium.  Unable to obtain CT Head due to respiratory status.  Ultimately required intubation. 1/26: Remains critically ill. Issues with vent dyssynchrony and ventilation, plan to start Nimbex gtt. Creatinine improved with  diuresis, continue with diuresis, Nephrology considering Lasix gtt. 1/27: ABG's improved on Nimbex, persistent cuff leak, exchanged ETT. Decadron started due to upper airway swelling. Will attempt to d/c Nimbex. Good diuresis with Lasix gtt with 4.5 L of UOP last 24 hrs, Creatinine improving. 1/28: Continues with good diuresis on Lasix gtt, 3L of UOP over preceding 24 hrs, creatinine continues to improve.  Vent requirements slowly improving. 1/29: No acute events overnight. Remained mechanically intubated with minimal vent settings: PEEP 5/FiO2 40%.  Remains on lasix gtt '@2mg'$ /hr.  Not on sedation and attempting to follow commands but extremely weak.  Will perform SBT as tolerated   1/30: No acute events overnight.  Pt awake and following commands on precedex gtt '@0'$ .2 mcg/kg/hr.  Lasix gtt '@4'$  mg/hr. Tolerating SBT 15/8 1/31: No acute events, awake and following commands on low dose precedex.  Remains on Lasix gtt @ '4mg'$ /hr,  Creatinine remains stable BUN slowly uptrending. Tolerating SBT 15/8 ~ will continue to wean PSV as tolerated ~ EXTUBATED. Lasix gtt d/c following extubation.  Following extubation, pt confirmed DNR/DNI status in presence of her son at bedside. 2/1: Tolerated BiPAP overnight, weaned to [facility] this am.  Speech evaluation performed, which pt FAILED. Consult PT/OT.  Creatinine continues to improve, started on D5W @ 50 ml/hr due to Hypernatremia of 155.  Holding diuresis today 2/2: Pt febrile currently on Bipap '@50'$ %.  Pt awake and following commands.  Failed speech evaluation on 02/1 and declining NGT placement.  2/2: Venous US BLE: No evidence of deep venous thrombosis in either lower extremity.  2/3: During goals of care discussion pt agreeable to small bore NGT placement and dialysis if needed.  She confirmed DNR/DNI  2/4: Nursing staff reporting pt now  tolerating ice chips without signs of aspiration.  Will contact speech therapy to evaluate pt again.  Dobhoff placement pending.  No acute  events overnight  2/5: back to hospitalist service 2/6-2/8: Cr not improving -  nephrology following, pt agreeable to temporary HD if needed, nephro recs continue to monitor UOP. No acute need for dialysis today. Continue to hold diuretics.    Lab Results  Component Value Date   CREATININE 3.59 (H) 06/27/2022   CREATININE 3.49 (H) 06/26/2022   CREATININE 3.12 (H) 06/25/2022    Consultants:  PCCU Nephrology  Palliative Care   Procedures: EEG 01/26 Art line insertion 01/26 CVC to L IJ 01/25 Intubation 01/25      ASSESSMENT & PLAN:   Principal Problem:   Severe sepsis ([place]) Active Problems:   Acute on chronic respiratory failure with hypoxia and hypercapnia (HCC)   Chronic diastolic CHF (congestive heart failure) (HCC)   Essential hypertension   Acute renal failure superimposed on stage 3b chronic kidney disease (HCC)   Morbid obesity with BMI of 50.0-59.9, adult (HCC)   OSA    History of stroke   Extreme obesity with alveolar hypoventilation (HCC)   Cellulitis   Acute metabolic encephalopathy   Community acquired pneumonia  Toxic Metabolic Encephalopathy likely CO2 narcosis - resolved Mental status improved with improvement in her CO2 levels and resolution of CO2 narcosis and metabolic derangements.   Acute on Chronic Kidney Injury likely secondary to severe infection/sepsis on admission, was improving then worse again, concern for volume contraction / diuresis effect  Hypernatremia - resolved Contraction Alkalosis - resolved  holding diuretics, holding IVF management per nephrology - may need to consider dialysis  Follow BMP  Acute on Chronic Hypoxic and Hypercapnic Respiratory Failure D/t Acute on Chronic HFpEF supplemental O2 to keep sats >=90%, wean as tolerated palliative care involved DNR/DNI  continue supportive care with bronchodilators  Acute on Chronic HFpEF Holding diuresis given contraction, hypernatremia, and renal failure.   Anemia of Chronic  Disease Monitor CBC Aranesp weekly   Hypokalemia monitor and replete PRN       DVT prophylaxis: heparin sq Pertinent IV fluids/nutrition: none Central lines / invasive devices: none  Code Status: DNR  Current Admission Status: inpatient   TOC needs / Dispo plan: SNF Barriers to discharge / significant pending items: renal function precludes discharge              Subjective / Brief ROS:  Patient reports feeling tired, no other concerns. RN reports some confusion this morning but pt to baseline by rounds.   Denies CP/SOB.  Pain controlled - knee soreness stable.  Denies new weakness.  Tolerating diet.  Reports no concerns w/ urination/defecation.   Family Communication: left voicemail 06/27/22 2:02 PM for Lori     Objective Findings:  Vitals:   06/27/22 0500 06/27/22 0640 06/27/22 0907 06/27/22 1300  BP:  (!) 148/65 (!) 143/55 (!) 144/59  Pulse:  96 97 93  Resp:  '19 18 16  '$ Temp:  98.1 F (36.7 C) 98.1 F (36.7 C) 98.7 F (37.1 C)  TempSrc:      SpO2:  99% 96% 96%  Weight: (!) 145.7 kg     Height:        Intake/Output Summary (Last 24 hours) at 06/27/2022 1358 Last data filed at 06/27/2022 0656 Gross per 24 hour  Intake --  Output 200 ml  Net -200 ml    Filed Weights   06/25/22 0500 06/26/22 0500 06/27/22 0500  Weight: (!) 144 kg (!) 146.7 kg (!) 145.7 kg    Examination:  Physical Exam Constitutional:      General: She is not in acute distress.    Appearance: She is obese.  Cardiovascular:     Rate and Rhythm: Normal rate and regular rhythm.     Heart sounds: Normal heart sounds.  Pulmonary:     Effort: Pulmonary effort is normal.     Breath sounds: Normal breath sounds.  Abdominal:     Palpations: Abdomen is soft.  Musculoskeletal:        General: No swelling.  Skin:    General: Skin is warm and dry.  Neurological:     General: No focal deficit present.     Mental Status: She is alert.  Psychiatric:        Mood and Affect: Mood  normal.        Behavior: Behavior normal.          Scheduled Medications:   budesonide (PULMICORT) nebulizer solution  0.5 mg Nebulization BID   darbepoetin (ARANESP) injection - NON-DIALYSIS  60 mcg Subcutaneous Q Tue-1800   feeding supplement  237 mL Oral TID BM   fluocinonide-emollient   Topical QODAY   Gerhardt's butt cream   Topical TID   heparin  5,000 Units Subcutaneous Q8H   ipratropium-albuterol  3 mL Nebulization BID   multivitamin with minerals  1 tablet Oral Daily   pantoprazole (PROTONIX) IV  40 mg Intravenous Q24H    Continuous Infusions:   PRN Medications:  docusate sodium, ipratropium-albuterol, lip balm, ondansetron **OR** ondansetron (ZOFRAN) IV, mouth rinse, mouth rinse, phenol  Antimicrobials from admission:  Anti-infectives (From admission, onward)    Start     Dose/Rate Route Frequency Ordered Stop   06/14/22 1400  ceFEPIme (MAXIPIME) 2 g in sodium chloride 0.9 % 100 mL IVPB        2 g 200 mL/hr over 30 Minutes Intravenous Every 12 hours 06/14/22 1301 06/21/22 0236   06/13/22 1000  ceFEPIme (MAXIPIME) 2 g in sodium chloride 0.9 % 100 mL IVPB  Status:  Discontinued        2 g 200 mL/hr over 30 Minutes Intravenous Every 24 hours 06/12/22 1430 06/13/22 0747   06/13/22 0900  cefTRIAXone (ROCEPHIN) 2 g in sodium chloride 0.9 % 100 mL IVPB  Status:  Discontinued        2 g 200 mL/hr over 30 Minutes Intravenous Every 24 hours 06/13/22 0747 06/14/22 1230   06/11/22 1145  vancomycin (VANCOREADY) IVPB 2000 mg/400 mL        2,000 mg 200 mL/hr over 120 Minutes Intravenous  Once 06/11/22 1047 06/11/22 1344   06/11/22 1145  ceFEPIme (MAXIPIME) 2 g in sodium chloride 0.9 % 100 mL IVPB  Status:  Discontinued        2 g 200 mL/hr over 30 Minutes Intravenous 2 times daily 06/11/22 1047 06/12/22 1430   06/10/22 2300  vancomycin (VANCOCIN) IVPB 1000 mg/200 mL premix  Status:  Discontinued       See Hyperspace for full Linked Orders Report.   1,000 mg 200 mL/hr over  60 Minutes Intravenous Every 48 hours 06/08/22 2307 06/09/22 0922   06/10/22 2300  vancomycin (VANCOREADY) IVPB 1250 mg/250 mL  Status:  Discontinued       See Hyperspace for full Linked Orders Report.   1,250 mg 166.7 mL/hr over 90 Minutes Intravenous Every 48 hours 06/08/22 2307 06/09/22 0922   06/10/22 1800  cephALEXin (  KEFLEX) capsule 500 mg  Status:  Discontinued        500 mg Oral Every 6 hours 06/10/22 1521 06/11/22 1047   06/09/22 2200  vancomycin (VANCOREADY) IVPB 750 mg/150 mL  Status:  Discontinued        750 mg 150 mL/hr over 60 Minutes Intravenous Every 24 hours 06/09/22 0923 06/10/22 1521   06/09/22 1000  ceFEPIme (MAXIPIME) 2 g in sodium chloride 0.9 % 100 mL IVPB  Status:  Discontinued        2 g 200 mL/hr over 30 Minutes Intravenous Every 12 hours 06/09/22 0916 06/10/22 1521   06/08/22 2300  vancomycin (VANCOCIN) IVPB 1000 mg/200 mL premix  Status:  Discontinued        1,000 mg 200 mL/hr over 60 Minutes Intravenous  Once 06/08/22 2257 06/08/22 2307   06/08/22 2215  vancomycin (VANCOCIN) IVPB 1000 mg/200 mL premix       See Hyperspace for full Linked Orders Report.   1,000 mg 200 mL/hr over 60 Minutes Intravenous  Once 06/08/22 2210 06/09/22 0014   06/08/22 2215  vancomycin (VANCOREADY) IVPB 1500 mg/300 mL       See Hyperspace for full Linked Orders Report.   1,500 mg 150 mL/hr over 120 Minutes Intravenous  Once 06/08/22 2210 06/10/22 0807   06/08/22 1915  piperacillin-tazobactam (ZOSYN) IVPB 3.375 g        3.375 g 100 mL/hr over 30 Minutes Intravenous  Once 06/08/22 1909 06/08/22 1959           Data Reviewed:  I have personally reviewed the following...  CBC: Recent Labs  Lab 06/22/22 0455 06/23/22 0408 06/24/22 0358 06/25/22 0500 06/26/22 0434 06/27/22 0303  WBC 11.8* 10.4 9.8 7.6 7.0 7.4  NEUTROABS 8.8* 7.9* 7.3  --   --   --   HGB 8.3* 8.5* 8.0* 7.7* 7.7* 7.5*  HCT 29.6* 29.9* 28.0* 26.5* 26.9* 26.3*  MCV 85.8 84.9 84.6 83.3 84.1 83.5  PLT 207  237 217 211 228 283   Basic Metabolic Panel: Recent Labs  Lab 06/21/22 0530 06/22/22 0455 06/23/22 0408 06/24/22 0358 06/25/22 0500 06/26/22 0434 06/27/22 0303  NA 158* 151* 150* 143 143 141 139  K 4.0 3.3* 3.6 3.3* 3.6 3.6 3.5  CL 112* 108 108 102 103 101 103  CO2 37* 33* '30 29 28 29 27  '$ GLUCOSE 111* 129* 110* 96 98 101* 108*  BUN 87* 94* 88* 78* 78* 77* 71*  CREATININE 1.83* 2.48* 2.58* 2.62* 3.12* 3.49* 3.59*  CALCIUM 9.1 9.1 9.0 8.7* 8.8* 9.0 8.6*  MG  --  2.3 2.2 1.8 2.6* 2.5* 2.4  PHOS 3.9 3.8 4.1 4.5  --   --   --    GFR: Estimated Creatinine Clearance: 21 mL/min (A) (by C-G formula based on SCr of 3.59 mg/dL (H)). Liver Function Tests: Recent Labs  Lab 06/21/22 0530  ALBUMIN 2.6*   No results for input(s): "LIPASE", "AMYLASE" in the last 168 hours. No results for input(s): "AMMONIA" in the last 168 hours. Coagulation Profile: No results for input(s): "INR", "PROTIME" in the last 168 hours. Cardiac Enzymes: No results for input(s): "CKTOTAL", "CKMB", "CKMBINDEX", "TROPONINI" in the last 168 hours. BNP (last 3 results) No results for input(s): "PROBNP" in the last 8760 hours. HbA1C: No results for input(s): "HGBA1C" in the last 72 hours. CBG: Recent Labs  Lab 06/23/22 2310 06/24/22 0323 06/24/22 0724 06/24/22 1147 06/24/22 1550  GLUCAP 162* 120* 116* 111* 93   Lipid Profile:  No results for input(s): "CHOL", "HDL", "LDLCALC", "TRIG", "CHOLHDL", "LDLDIRECT" in the last 72 hours. Thyroid Function Tests: No results for input(s): "TSH", "T4TOTAL", "FREET4", "T3FREE", "THYROIDAB" in the last 72 hours. Anemia Panel: No results for input(s): "VITAMINB12", "FOLATE", "FERRITIN", "TIBC", "IRON", "RETICCTPCT" in the last 72 hours. Most Recent Urinalysis On File:     Component Value Date/Time   COLORURINE YELLOW (A) 06/08/2022 2134   APPEARANCEUR CLOUDY (A) 06/08/2022 2134   APPEARANCEUR Clear 04/27/2012 0958   LABSPEC 1.038 (H) 06/08/2022 2134   LABSPEC 1.014  04/27/2012 0958   PHURINE 5.0 06/08/2022 2134   GLUCOSEU NEGATIVE 06/08/2022 2134   GLUCOSEU Negative 04/27/2012 0958   HGBUR LARGE (A) 06/08/2022 2134   BILIRUBINUR NEGATIVE 06/08/2022 2134   BILIRUBINUR neg 02/19/2021 1040   BILIRUBINUR Negative 04/27/2012 0958   KETONESUR NEGATIVE 06/08/2022 2134   PROTEINUR NEGATIVE 06/08/2022 2134   UROBILINOGEN 0.2 02/19/2021 1040   NITRITE NEGATIVE 06/08/2022 2134   LEUKOCYTESUR NEGATIVE 06/08/2022 2134   LEUKOCYTESUR Negative 04/27/2012 0958   Sepsis Labs: '@LABRCNTIP'$ (procalcitonin:4,lacticidven:4) Microbiology: No results found for this or any previous visit (from the past 240 hour(s)).     Radiology Studies last 3 days: No results found.           LOS: 19 days       Emeterio Reeve, DO [facility] 06/27/2022, 1:58 PM    Dictation software may have been used to generate the above note. Typos may occur and escape review in typed/dictated notes. Please contact Dr Sheppard Coil directly for clarity if needed.  Staff may message me via secure chat in [place]  but this may not receive an immediate response,  please page me for urgent matters!  If 7PM-7AM, please contact night coverage [email or web address]

## 2022-06-27 NOTE — Progress Notes (Signed)
Palliative Care Progress Note, Assessment & Plan   Patient Name: Samantha Mason       Date: 06/27/2022 DOB: 09/23/1950  Age: 72 y.o. MRN#: [id number] Attending Physician: Emeterio Reeve, DO Primary Care Physician: Glean Hess, MD Admit Date: 06/08/2022  Subjective: Patient is sitting up in bed in no apparent distress.  She acknowledges my presence and is able to make her wishes known.  She has no acute complaints today she shares that she is hopeful to be able to discharge soon.  No family or friends present at bedside.  HPI: 72 y.o. female  with past medical history of morbid obesity, chronic HFpEF, CKD (stage III), chronic LE lymphedema (treatment of torsemide), HTN, asthma, stroke (1995), and OSA (noncompliant with CPAP) admitted on 06/08/2022 with feeling unwell for approximately 1 week, chills, generalized weakness, and fatigue.   Patient is being treated for sepsis in setting of lower extremity cellulitis, AKI, and acute on chronic hypercapnic respiratory failure in the setting of AECOPD.   1/23, patient was transferred to ICU due to CO2 narcosis and placed on BiPAP.   1/25 Intubated 1/31 extubated and DNR/DNI confirmed 2/1 failed SLP and declining NG tube 2/2: Venous US BLE negative for DVT  2/3: During goals of care discussion pt agreeable to small bore NGT placement and dialysis if needed.  She confirmed DNR/DNI  2/6-2/8: Cr not improving -  nephrology following, pt agreeable to temporary HD if needed, nephro recs continue to monitor UOP. No acute need for dialysis today. Continue to hold diuretics.    Summary of counseling/coordination of care: After reviewing the patient's chart and assessing the patient at bedside, I discussed plan and goals of care with patient.  We reviewed that  nephrology has been following closely.  She shares she hopes she does not have to have hemodialysis.  She also shares she is not clear on what HD would look like for her.  Temporary inpatient as well as outpatient HD discussed in detail. Patient states she would be accepting of HD if needed.   In light of plan for SNF, MOST form reviewed again.  Pt's wishes were as follows:  I completed a MOST form today. The patient and family outlined their wishes for the following treatment decisions:  Cardiopulmonary Resuscitation: Do Not Attempt Resuscitation (DNR/No CPR)  Medical Interventions: Limited Additional Interventions: Use medical treatment, IV fluids and cardiac monitoring as indicated, DO NOT USE intubation or mechanical ventilation. May consider use of less invasive airway support such as BiPAP or CPAP. Also provide comfort measures. Transfer to the hospital if indicated. Avoid intensive care.   Antibiotics: Antibiotics if indicated  IV Fluids: IV fluids if indicated  Feeding Tube: No feeding tube     Even though patient's goals are clear, patient again deferred completion of MOST at this time.  Discussed the importance of advanced care planning and discussions with family to ensure patient's wishes are known in advance of any urgent or emergent decision.  PMT will remain available to patient/family throughout her hospitalization.  PMT will shadow patient's chart and reengage at patient/family's request, if goals change, or if patient's health deteriorates during hospitalization.  Physical Exam Vitals reviewed.  Constitutional:  General: She is not in acute distress.    Appearance: She is obese. She is not ill-appearing.  Eyes:     Pupils: Pupils are equal, round, and reactive to light.  Cardiovascular:     Rate and Rhythm: Normal rate.     Pulses: Normal pulses.  Pulmonary:     Effort: Pulmonary effort is normal.  Abdominal:     Palpations: Abdomen is soft.  Musculoskeletal:      Comments: Generalized weakness  Skin:    General: Skin is warm and dry.     Comments: Bilateral LE dressings/wraps clean dry and intact  Neurological:     Mental Status: She is alert and oriented to person, place, and time.  Psychiatric:        Mood and Affect: Mood normal.        Behavior: Behavior normal.        Thought Content: Thought content normal.        Judgment: Judgment normal.             Total Time 50 minutes   Aireana Ryland L. Ilsa Iha, FNP-BC Palliative Medicine Team Team Phone # [phone]

## 2022-06-28 DIAGNOSIS — A419 Sepsis, unspecified organism: Secondary | ICD-10-CM | POA: Diagnosis not present

## 2022-06-28 DIAGNOSIS — J189 Pneumonia, unspecified organism: Secondary | ICD-10-CM | POA: Diagnosis not present

## 2022-06-28 DIAGNOSIS — E662 Morbid (severe) obesity with alveolar hypoventilation: Secondary | ICD-10-CM | POA: Diagnosis not present

## 2022-06-28 DIAGNOSIS — L0390 Cellulitis, unspecified: Secondary | ICD-10-CM | POA: Diagnosis not present

## 2022-06-28 LAB — BASIC METABOLIC PANEL
Anion gap: 9 (ref 5–15)
BUN: 68 mg/dL — ABNORMAL HIGH (ref 8–23)
CO2: 26 mmol/L (ref 22–32)
Calcium: 8.5 mg/dL — ABNORMAL LOW (ref 8.9–10.3)
Chloride: 103 mmol/L (ref 98–111)
Creatinine, Ser: 3.62 mg/dL — ABNORMAL HIGH (ref 0.44–1.00)
GFR, Estimated: 13 mL/min — ABNORMAL LOW (ref >60)
Glucose, Bld: 108 mg/dL — ABNORMAL HIGH (ref 70–99)
Potassium: 3.3 mmol/L — ABNORMAL LOW (ref 3.5–5.1)
Sodium: 138 mmol/L (ref 135–145)

## 2022-06-28 LAB — MAGNESIUM: Magnesium: 2.3 mg/dL (ref 1.7–2.4)

## 2022-06-28 MED ORDER — TRAZODONE HCL 50 MG PO TABS
25.0000 mg | ORAL_TABLET | Freq: Once | ORAL | Status: AC
  Administered 2022-06-28: 25 mg via ORAL
  Filled 2022-06-28: qty 1

## 2022-06-28 MED ORDER — ACETAMINOPHEN 325 MG PO TABS
650.0000 mg | ORAL_TABLET | Freq: Four times a day (QID) | ORAL | Status: DC | PRN
  Administered 2022-06-28 – 2022-07-09 (×5): 650 mg via ORAL
  Filled 2022-06-28 (×8): qty 2

## 2022-06-28 MED ORDER — DIPHENOXYLATE-ATROPINE 2.5-0.025 MG PO TABS
1.0000 | ORAL_TABLET | Freq: Four times a day (QID) | ORAL | Status: DC | PRN
  Administered 2022-06-28 – 2022-06-29 (×2): 1 via ORAL
  Filled 2022-06-28 (×2): qty 1

## 2022-06-28 MED ORDER — DIPHENOXYLATE-ATROPINE 2.5-0.025 MG/5ML PO LIQD: 5.0000 mL | Freq: Four times a day (QID) | ORAL | Status: DC | PRN

## 2022-06-28 MED ORDER — SODIUM CHLORIDE 0.9 % IV SOLN
250.0000 mg | INTRAVENOUS | Status: DC
  Administered 2022-06-28 – 2022-07-05 (×2): 250 mg via INTRAVENOUS
  Filled 2022-06-28 (×2): qty 20

## 2022-06-28 NOTE — Progress Notes (Signed)
[facility]  ROUNDING NOTE   Subjective:   Samantha Mason  is a 72 year old female with past medical history including COPD with renal failure requiring 2 L oxygen as needed, chronic diastolic heart failure, sleep apnea, morbid obesity, hypertension, and chronic kidney disease stage IV. Patient presents to the emergency department feeling sick for a few days.  She was admitted for Sepsis [facility]) [A41.9] Cellulitis, unspecified cellulitis site [L03.90] Community acquired pneumonia, unspecified laterality [J18.9] Sepsis, due to unspecified organism, unspecified whether acute organ dysfunction present [facility]) [A41.9]  Patient is followed by our office outpatient. She was last seen in office on Apr 17, 2022.   Patient resting well Room air No lower extremity edema Appetite remains poor, states her food is cold Doesn't like Ensure   02/08 0701 - 02/09 0700 In: 480 [P.O.:480] Out: -  Lab Results  Component Value Date   CREATININE 3.62 (H) 06/28/2022   CREATININE 3.59 (H) 06/27/2022   CREATININE 3.49 (H) 06/26/2022      Objective:  Vital signs in last 24 hours:  Temp:  [98.1 F (36.7 C)-98.8 F (37.1 C)] 98.2 F (36.8 C) (02/09 1154) Pulse Rate:  [91-106] 95 (02/09 1154) Resp:  [14-24] 16 (02/09 1154) BP: (131-150)/(49-79) 150/54 (02/09 1154) SpO2:  [96 %-99 %] 96 % (02/09 1154) FiO2 (%):  [32 %] 32 % (02/09 0300) Weight:  [147.6 kg] 147.6 kg (02/09 0500)  Weight change: 1.9 kg Filed Weights   06/26/22 0500 06/27/22 0500 06/28/22 0500  Weight: (!) 146.7 kg (!) 145.7 kg (!) 147.6 kg    Intake/Output: I/O last 3 completed shifts: In: 480 [P.O.:480] Out: 200 [Urine:200]   Intake/Output this shift:  No intake/output data recorded.  Physical Exam: General: NAD  Head: Normocephalic, atraumatic.    Eyes: Anicteric  Lungs:  Clear to auscultation, Normal effort, [place] O2  Heart: Regular rate and rhythm  Abdomen:  Soft, nontender  Extremities: No peripheral  edema.  Neurologic: Alert and oriented, moving all four extremities.   Skin: No lesions  Access: No hemodialysis access    Basic Metabolic Panel: Recent Labs  Lab 06/22/22 0455 06/23/22 0408 06/24/22 0358 06/25/22 0500 06/26/22 0434 06/27/22 0303 06/28/22 0514  NA 151* 150* 143 143 141 139 138  K 3.3* 3.6 3.3* 3.6 3.6 3.5 3.3*  CL 108 108 102 103 101 103 103  CO2 33* 30 29 28 29 27 26  $ GLUCOSE 129* 110* 96 98 101* 108* 108*  BUN 94* 88* 78* 78* 77* 71* 68*  CREATININE 2.48* 2.58* 2.62* 3.12* 3.49* 3.59* 3.62*  CALCIUM 9.1 9.0 8.7* 8.8* 9.0 8.6* 8.5*  MG 2.3 2.2 1.8 2.6* 2.5* 2.4 2.3  PHOS 3.8 4.1 4.5  --   --   --   --      Liver Function Tests: No results for input(s): "AST", "ALT", "ALKPHOS", "BILITOT", "PROT", "ALBUMIN" in the last 168 hours.  No results for input(s): "LIPASE", "AMYLASE" in the last 168 hours. No results for input(s): "AMMONIA" in the last 168 hours.   CBC: Recent Labs  Lab 06/22/22 0455 06/23/22 0408 06/24/22 0358 06/25/22 0500 06/26/22 0434 06/27/22 0303  WBC 11.8* 10.4 9.8 7.6 7.0 7.4  NEUTROABS 8.8* 7.9* 7.3  --   --   --   HGB 8.3* 8.5* 8.0* 7.7* 7.7* 7.5*  HCT 29.6* 29.9* 28.0* 26.5* 26.9* 26.3*  MCV 85.8 84.9 84.6 83.3 84.1 83.5  PLT 207 237 217 211 228 199     Cardiac Enzymes: No  results for input(s): "CKTOTAL", "CKMB", "CKMBINDEX", "TROPONINI" in the last 168 hours.  BNP: Invalid input(s): "POCBNP"  CBG: Recent Labs  Lab 06/23/22 2310 06/24/22 0323 06/24/22 0724 06/24/22 1147 06/24/22 1550  GLUCAP 162* 120* 116* 111* 74     Microbiology: Results for orders placed or performed during the hospital encounter of 06/08/22  Blood Culture (routine x 2)     Status: None   Collection Time: 06/08/22  7:18 PM   Specimen: BLOOD  Result Value Ref Range Status   Specimen Description BLOOD BLOOD LEFT ARM  Final   Special Requests   Final    BOTTLES DRAWN AEROBIC AND ANAEROBIC Blood Culture adequate volume   Culture   Final     NO GROWTH 5 DAYS Performed at [facility], [address], [place], [place] [address]    Report Status 06/13/2022 FINAL  Final  Blood Culture (routine x 2)     Status: None   Collection Time: 06/08/22  7:18 PM   Specimen: BLOOD  Result Value Ref Range Status   Specimen Description BLOOD BLOOD RIGHT ARM  Final   Special Requests   Final    BOTTLES DRAWN AEROBIC AND ANAEROBIC Blood Culture adequate volume   Culture   Final    NO GROWTH 5 DAYS Performed at [facility], [address] [place], [place] [address]    Report Status 06/13/2022 FINAL  Final  Resp panel by RT-PCR (RSV, Flu A&B, Covid) Anterior Nasal Swab     Status: None   Collection Time: 06/08/22  7:18 PM   Specimen: Anterior Nasal Swab  Result Value Ref Range Status   SARS Coronavirus 2 by RT PCR NEGATIVE NEGATIVE Final    Comment: (NOTE) SARS-CoV-2 target nucleic acids are NOT DETECTED.  The SARS-CoV-2 RNA is generally detectable in upper respiratory specimens during the acute phase of infection. The lowest concentration of SARS-CoV-2 viral copies this assay can detect is 138 copies/mL. A negative result does not preclude SARS-Cov-2 infection and should not be used as the sole basis for treatment or other patient management decisions. A negative result may occur with  improper specimen collection/handling, submission of specimen other than nasopharyngeal swab, presence of viral mutation(s) within the areas targeted by this assay, and inadequate number of viral copies(<138 copies/mL). A negative result must be combined with clinical observations, patient history, and epidemiological information. The expected result is Negative.  Fact Sheet for Patients:  EntrepreneurPulse.com.au  Fact Sheet for Healthcare Providers:  IncredibleEmployment.be  This test is no t yet approved or cleared by the [place] FDA and  has been authorized for detection and/or  diagnosis of SARS-CoV-2 by FDA under an Emergency Use Authorization (EUA). This EUA will remain  in effect (meaning this test can be used) for the duration of the COVID-19 declaration under Section 564(b)(1) of the Act, 21 U.S.C.section 360bbb-3(b)(1), unless the authorization is terminated  or revoked sooner.       Influenza A by PCR NEGATIVE NEGATIVE Final   Influenza B by PCR NEGATIVE NEGATIVE Final    Comment: (NOTE) The Xpert Xpress SARS-CoV-2/FLU/RSV plus assay is intended as an aid in the diagnosis of influenza from Nasopharyngeal swab specimens and should not be used as a sole basis for treatment. Nasal washings and aspirates are unacceptable for Xpert Xpress SARS-CoV-2/FLU/RSV testing.  Fact Sheet for Patients: EntrepreneurPulse.com.au  Fact Sheet for Healthcare Providers: IncredibleEmployment.be  This test is not yet approved or cleared by the [place] FDA and has  been authorized for detection and/or diagnosis of SARS-CoV-2 by FDA under an Emergency Use Authorization (EUA). This EUA will remain in effect (meaning this test can be used) for the duration of the COVID-19 declaration under Section 564(b)(1) of the Act, 21 U.S.C. section 360bbb-3(b)(1), unless the authorization is terminated or revoked.     Resp Syncytial Virus by PCR NEGATIVE NEGATIVE Final    Comment: (NOTE) Fact Sheet for Patients: EntrepreneurPulse.com.au  Fact Sheet for Healthcare Providers: IncredibleEmployment.be  This test is not yet approved or cleared by the [place] FDA and has been authorized for detection and/or diagnosis of SARS-CoV-2 by FDA under an Emergency Use Authorization (EUA). This EUA will remain in effect (meaning this test can be used) for the duration of the COVID-19 declaration under Section 564(b)(1) of the Act, 21 U.S.C. section 360bbb-3(b)(1), unless the authorization is terminated  or revoked.  Performed at [facility], [address] [place], [place] [address]   Urine Culture     Status: None   Collection Time: 06/08/22  9:34 PM   Specimen: In/Out Cath Urine  Result Value Ref Range Status   Specimen Description   Final    IN/OUT CATH URINE Performed at [facility], [address] [place], [place] [address]    Special Requests   Final    NONE Performed at [facility], [address], [place], [place] [address]    Culture   Final    NO GROWTH Performed at [facility], [place] [address], [place], [place] [address]    Report Status 06/10/2022 FINAL  Final  MRSA Next Gen by PCR, Nasal     Status: None   Collection Time: 06/10/22 11:49 PM   Specimen: Nasal Mucosa; Nasal Swab  Result Value Ref Range Status   MRSA by PCR Next Gen NOT DETECTED NOT DETECTED Final    Comment: (NOTE) The GeneXpert MRSA Assay (FDA approved for NASAL specimens only), is one component of a comprehensive MRSA colonization surveillance program. It is not intended to diagnose MRSA infection nor to guide or monitor treatment for MRSA infections. Test performance is not FDA approved in patients less than 36 years old. Performed at [facility], [place], [place] [address]   Respiratory (~20 pathogens) panel by PCR     Status: None   Collection Time: 06/12/22  6:15 AM   Specimen: Nasopharyngeal Swab; Respiratory  Result Value Ref Range Status   Adenovirus NOT DETECTED NOT DETECTED Final   Coronavirus 229E NOT DETECTED NOT DETECTED Final    Comment: (NOTE) The Coronavirus on the Respiratory Panel, DOES NOT test for the novel  Coronavirus (2019 nCoV)    Coronavirus HKU1 NOT DETECTED NOT DETECTED Final   Coronavirus NL63 NOT DETECTED NOT DETECTED Final   Coronavirus OC43 NOT DETECTED NOT DETECTED Final   Metapneumovirus NOT DETECTED NOT DETECTED Final   Rhinovirus / Enterovirus NOT DETECTED NOT DETECTED Final    Influenza A NOT DETECTED NOT DETECTED Final   Influenza B NOT DETECTED NOT DETECTED Final   Parainfluenza Virus 1 NOT DETECTED NOT DETECTED Final   Parainfluenza Virus 2 NOT DETECTED NOT DETECTED Final   Parainfluenza Virus 3 NOT DETECTED NOT DETECTED Final   Parainfluenza Virus 4 NOT DETECTED NOT DETECTED Final   Respiratory Syncytial Virus NOT DETECTED NOT DETECTED Final   Bordetella pertussis NOT DETECTED NOT DETECTED Final   Bordetella Parapertussis NOT DETECTED NOT DETECTED Final   Chlamydophila pneumoniae NOT DETECTED NOT  DETECTED Final   Mycoplasma pneumoniae NOT DETECTED NOT DETECTED Final    Comment: Performed at [facility], [place] [address] [place], [place] [address]  Culture, Respiratory w Gram Stain     Status: None   Collection Time: 06/17/22  8:15 AM   Specimen: Tracheal Aspirate; Respiratory  Result Value Ref Range Status   Specimen Description   Final    TRACHEAL ASPIRATE Performed at [facility], [address] [place], [place] [address]    Special Requests   Final    NONE Performed at [facility], [address] [place], [place] [address]    Gram Stain   Final    RARE WBC PRESENT, PREDOMINANTLY PMN RARE SQUAMOUS EPITHELIAL CELLS PRESENT FEW YEAST Performed at [facility], [place] [address] [place], [place] [address]    Culture FEW CANDIDA ALBICANS  Final   Report Status 06/19/2022 FINAL  Final  Gastrointestinal Panel by PCR , Stool     Status: None   Collection Time: 06/27/22  4:34 PM   Specimen: Stool  Result Value Ref Range Status   Campylobacter species NOT DETECTED NOT DETECTED Final   Plesimonas shigelloides NOT DETECTED NOT DETECTED Final   Salmonella species NOT DETECTED NOT DETECTED Final   Yersinia enterocolitica NOT DETECTED NOT DETECTED Final   Vibrio species NOT DETECTED NOT DETECTED Final   Vibrio cholerae NOT DETECTED NOT DETECTED Final   Enteroaggregative E coli (EAEC) NOT DETECTED NOT DETECTED Final    Enteropathogenic E coli (EPEC) NOT DETECTED NOT DETECTED Final   Enterotoxigenic E coli (ETEC) NOT DETECTED NOT DETECTED Final   Shiga like toxin producing E coli (STEC) NOT DETECTED NOT DETECTED Final   Shigella/Enteroinvasive E coli (EIEC) NOT DETECTED NOT DETECTED Final   Cryptosporidium NOT DETECTED NOT DETECTED Final   Cyclospora cayetanensis NOT DETECTED NOT DETECTED Final   Entamoeba histolytica NOT DETECTED NOT DETECTED Final   Giardia lamblia NOT DETECTED NOT DETECTED Final   Adenovirus F40/41 NOT DETECTED NOT DETECTED Final   Astrovirus NOT DETECTED NOT DETECTED Final   Norovirus GI/GII NOT DETECTED NOT DETECTED Final   Rotavirus A NOT DETECTED NOT DETECTED Final   Sapovirus (I, II, IV, and V) NOT DETECTED NOT DETECTED Final    Comment: Performed at [facility], [address] [place], [place] [address]    Coagulation Studies: No results for input(s): "LABPROT", "INR" in the last 72 hours.  Urinalysis: No results for input(s): "COLORURINE", "LABSPEC", "PHURINE", "GLUCOSEU", "HGBUR", "BILIRUBINUR", "KETONESUR", "PROTEINUR", "UROBILINOGEN", "NITRITE", "LEUKOCYTESUR" in the last 72 hours.  Invalid input(s): "APPERANCEUR"    Imaging: DG Abd 1 View  Result Date: 06/27/2022 CLINICAL DATA:  Diarrhea. EXAM: ABDOMEN - 1 VIEW COMPARISON:  Abdominal radiograph dated 06/15/2022. FINDINGS: Evaluation is limited due to body habitus. No bowel dilatation or evidence of obstruction. No obvious free air or radiopaque calculi. Right upper quadrant cholecystectomy clips. Degenerative changes of the spine. No acute osseous pathology. IMPRESSION: Nonobstructive bowel gas pattern. Electronically Signed   By: Anner Crete M.D.   On: 06/27/2022 17:46     Medications:    ferric gluconate (FERRLECIT) IVPB       budesonide (PULMICORT) nebulizer solution  0.5 mg Nebulization BID   darbepoetin (ARANESP) injection - NON-DIALYSIS  60 mcg Subcutaneous Q Tue-1800   feeding supplement   237 mL Oral TID BM   fluocinonide-emollient   Topical QODAY   Gerhardt's butt cream   Topical TID   heparin  5,000  Units Subcutaneous Q8H   multivitamin with minerals  1 tablet Oral Daily   pantoprazole (PROTONIX) IV  40 mg Intravenous Q24H   docusate sodium, ipratropium-albuterol, lip balm, ondansetron **OR** ondansetron (ZOFRAN) IV, mouth rinse, mouth rinse, phenol  Assessment/ Plan:  Samantha Mason is a 72 y.o.  female  with past medical history including COPD with renal failure requiring 2 L oxygen as needed, chronic diastolic heart failure, sleep apnea, morbid obesity, hypertension, and chronic kidney disease stage IV. Patient presents to the emergency department feeling sick and was admitted for Sepsis [facility]) [A41.9] Cellulitis, unspecified cellulitis site [L03.90] Community acquired pneumonia, unspecified laterality [J18.9] Sepsis, due to unspecified organism, unspecified whether acute organ dysfunction present [facility]) [A41.9]   Acute Kidney Injury on chronic kidney disease stage IIIb with baseline creatinine 1.55 and GFR of 36 on 04/17/22.  Acute kidney injury secondary to severe infection. No IV contrast exposure.  Diuretics remain held. Lower extremity edema present, however patient has history of lymphedema.   Creatinine stable today. No acute indication for dialysis but if nutritional status remains poor, patient would make a poor long term dialysis candidate. Will continue to monitor.    Lab Results  Component Value Date   CREATININE 3.62 (H) 06/28/2022   CREATININE 3.59 (H) 06/27/2022   CREATININE 3.49 (H) 06/26/2022    Intake/Output Summary (Last 24 hours) at 06/28/2022 1223 Last data filed at 06/28/2022 0500 Gross per 24 hour  Intake 480 ml  Output --  Net 480 ml    2. Anemia of chronic kidney disease Lab Results  Component Value Date   HGB 7.5 (L) 06/27/2022  Hemoglobin remains decreased. Continue weekly Aranesp on Tuesday.   3.  Chronic diastolic heart  failure.  Echo from 07/16/21 shows EF 55 to 60% with mild LVH.  Diuretics remain held.  4.  Acute respiratory failure.  Weaned to room air, continue supportive care   LOS: [place] 2/9/202412:23 PM

## 2022-06-28 NOTE — Progress Notes (Signed)
Patient has refused bipap for the night. RN aware

## 2022-06-28 NOTE — Progress Notes (Signed)
PROGRESS NOTE    NORETA MCNICHOLAS   [id number] DOB: [birth year]  DOA: 06/08/2022 Date of Service: 06/28/22 PCP: Glean Hess, MD     Brief Narrative / Hospital Course:  72 y.o. female with medical history significant for Morbid obesity, chronic HFpEF,CKD stage III, chronic lower extremity lymphedema on treatment with torsemide, hypertension, asthma, stroke (1995) and obstructive sleep apnea (non compliant with cpap) to ED w/ CC fatigue, LE swelling/redness. Pt was admitted Course complicated by CO2 narcosis, failed BiPAP requiring intubation and mechanical ventilation.  Transferred to hospitalist service on 06/24/22. Treated for Acute on Chronic HFpEF. Her respiratory failure has been treated with BiPAP, diuresis, and bronchodilators. She has been refusing BiPAP now. Currently on 2L [place]. Diuresis held, Cr worsens despite this, nephrology following.  1/20: Presented to ED, admitted by [facility] with Sepsis in setting of lower extremity Cellulitis, AKI, and Acute on Chronic Hypercapnic Respiratory Failure in setting AECOPD.  1/21: Added cefepime, continue vancomycin, increase dose of Cardizem for better heart rate control 1/22: Had to reduce Cardizem dose back to her home dose as blood pressure dropped 1/23: Transferred to ICU overnight due to CO2 narcosis, placed on BiPAP.  PCCM consulted.  Worsening AKI, holding diuresis.  Agitation requiring Precedex, high risk for Intubation.  Palliative Care consulted for [place] conversations.  ABX broadened back to Cefepime and Vancomycin 1/24: Pt on Bipap but awake and able to follow commands. Attempt to transition off Bipap.  Palliative Care following 1/25: Pt currently on Bipap and precedex gtt.  Required 2.5 mg iv valium overnight for delirium.  Unable to obtain CT Head due to respiratory status.  Ultimately required intubation. 1/26: Remains critically ill. Issues with vent dyssynchrony and ventilation, plan to start Nimbex gtt. Creatinine improved with  diuresis, continue with diuresis, Nephrology considering Lasix gtt. 1/27: ABG's improved on Nimbex, persistent cuff leak, exchanged ETT. Decadron started due to upper airway swelling. Will attempt to d/c Nimbex. Good diuresis with Lasix gtt with 4.5 L of UOP last 24 hrs, Creatinine improving. 1/28: Continues with good diuresis on Lasix gtt, 3L of UOP over preceding 24 hrs, creatinine continues to improve.  Vent requirements slowly improving. 1/29: No acute events overnight. Remained mechanically intubated with minimal vent settings: PEEP 5/FiO2 40%.  Remains on lasix gtt @2mg$ /hr.  Not on sedation and attempting to follow commands but extremely weak.  Will perform SBT as tolerated   1/30: No acute events overnight.  Pt awake and following commands on precedex gtt @0$ .2 mcg/kg/hr.  Lasix gtt @4$  mg/hr. Tolerating SBT 15/8 1/31: No acute events, awake and following commands on low dose precedex.  Remains on Lasix gtt @ 67m/hr,  Creatinine remains stable BUN slowly uptrending. Tolerating SBT 15/8 ~ will continue to wean PSV as tolerated ~ EXTUBATED. Lasix gtt d/c following extubation.  Following extubation, pt confirmed DNR/DNI status in presence of her son at bedside. 2/1: Tol[facility]  Speech evaluation performed, which pt FAILED. Consult PT/OT.  Creatinine continues to improve, started on D5W @ 50 ml/hr due to Hypernatremia of 155.  Holding diuresis today 2/2: Pt febrile currently on Bipap @50$ %.  Pt awake and following commands.  Failed speech evaluation on 02/1 and declining NGT placement.  2/2: Venous [place]: No evidence of deep venous thrombosis in either lower extremity.  2/3: During goals of care discussion pt agreeable to small bore NGT placement and dialysis if needed.  She confirmed DNR/DNI  2/4: Nursing staff reporting pt now  tolerating ice chips without signs of aspiration.  Will contact speech therapy to evaluate pt again.  Dobhoff placement pending.  No acute  events overnight  2/5: back to hospitalist service 2/6-2/9: Cr not improving but has been stable -  nephrology following, pt agreeable to temporary HD if needed, nephro recs continue to monitor UOP. No acute need for dialysis. Continue to hold diuretics.    Lab Results  Component Value Date   CREATININE 3.59 (H) 06/27/2022   CREATININE 3.49 (H) 06/26/2022   CREATININE 3.12 (H) 06/25/2022    Consultants:  PCCU Nephrology  Palliative Care   Procedures: EEG 01/26 Art line insertion 01/26 CVC to L IJ 01/25 Intubation 01/25      ASSESSMENT & PLAN:   Principal Problem:   Severe sepsis ([place]) Active Problems:   Acute on chronic respiratory failure with hypoxia and hypercapnia (HCC)   Chronic diastolic CHF (congestive heart failure) (HCC)   Essential hypertension   Acute renal failure superimposed on stage 3b chronic kidney disease (HCC)   Morbid obesity with BMI of 50.0-59.9, adult (HCC)   OSA    History of stroke   Extreme obesity with alveolar hypoventilation (HCC)   Cellulitis   Acute metabolic encephalopathy   Community acquired pneumonia  Toxic Metabolic Encephalopathy likely CO2 narcosis - resolved Mental status improved with improvement in her CO2 levels and resolution of CO2 narcosis and metabolic derangements.   Acute on Chronic Kidney Injury likely secondary to severe infection/sepsis on admission, was improving then worse again, concern for volume contraction / diuresis effect  Hypernatremia - resolved Contraction Alkalosis - resolved  holding diuretics, holding IVF management per nephrology - may need to consider dialysis  Follow BMP  Acute on Chronic Hypoxic and Hypercapnic Respiratory Failure D/t Acute on Chronic HFpEF supplemental O2 to keep sats >=90%, wean as tolerated palliative care involved DNR/DNI  continue supportive care with bronchodilators  Acute on Chronic HFpEF Holding diuresis given contraction, hypernatremia, and renal failure.    Anemia of Chronic Disease Monitor CBC Aranesp weekly   Hypokalemia monitor and replete PRN       DVT prophylaxis: heparin sq Pertinent IV fluids/nutrition: none Central lines / invasive devices: none  Code Status: DNR  Current Admission Status: inpatient   TOC needs / Dispo plan: SNF Barriers to discharge / significant pending items: renal function precludes discharge              Subjective / Brief ROS:  Patient reports feeling tired, no other concerns.  Denies CP/SOB.  Pain controlled - knee soreness stable.  Denies new weakness.  Tolerating diet.  Reports no concerns w/ urination/defecation.   Family Communication: none at this time     Objective Findings:  Vitals:   06/28/22 0806 06/28/22 0826 06/28/22 0830 06/28/22 1154  BP:  (!) 142/58 (!) 142/79 (!) 150/54  Pulse: 91 (!) 106 (!) 102 95  Resp: 20 18 18 16  $ Temp:  98.8 F (37.1 C) 98.8 F (37.1 C) 98.2 F (36.8 C)  TempSrc:  Oral Oral Oral  SpO2: 98%  98% 96%  Weight:      Height:        Intake/Output Summary (Last 24 hours) at 06/28/2022 1331 Last data filed at 06/28/2022 0500 Gross per 24 hour  Intake 480 ml  Output --  Net 480 ml    Filed Weights   06/26/22 0500 06/27/22 0500 06/28/22 0500  Weight: (!) 146.7 kg (!) 145.7 kg (!) 147.6 kg  Examination:  Physical Exam Constitutional:      General: She is not in acute distress.    Appearance: She is obese.  Cardiovascular:     Rate and Rhythm: Normal rate and regular rhythm.     Heart sounds: Normal heart sounds.  Pulmonary:     Effort: Pulmonary effort is normal.     Breath sounds: Normal breath sounds.  Abdominal:     Palpations: Abdomen is soft.  Musculoskeletal:        General: No swelling.  Skin:    General: Skin is warm and dry.  Neurological:     General: No focal deficit present.     Mental Status: She is alert.  Psychiatric:        Mood and Affect: Mood normal.        Behavior: Behavior normal.           Scheduled Medications:   budesonide (PULMICORT) nebulizer solution  0.5 mg Nebulization BID   darbepoetin (ARANESP) injection - NON-DIALYSIS  60 mcg Subcutaneous Q Tue-1800   feeding supplement  237 mL Oral TID BM   fluocinonide-emollient   Topical QODAY   Gerhardt's butt cream   Topical TID   heparin  5,000 Units Subcutaneous Q8H   multivitamin with minerals  1 tablet Oral Daily   pantoprazole (PROTONIX) IV  40 mg Intravenous Q24H    Continuous Infusions:  ferric gluconate (FERRLECIT) IVPB      PRN Medications:  acetaminophen, docusate sodium, ipratropium-albuterol, lip balm, ondansetron **OR** ondansetron (ZOFRAN) IV, mouth rinse, mouth rinse, phenol  Antimicrobials from admission:  Anti-infectives (From admission, onward)    Start     Dose/Rate Route Frequency Ordered Stop   06/14/22 1400  ceFEPIme (MAXIPIME) 2 g in sodium chloride 0.9 % 100 mL IVPB        2 g 200 mL/hr over 30 Minutes Intravenous Every 12 hours 06/14/22 1301 06/21/22 0236   06/13/22 1000  ceFEPIme (MAXIPIME) 2 g in sodium chloride 0.9 % 100 mL IVPB  Status:  Discontinued        2 g 200 mL/hr over 30 Minutes Intravenous Every 24 hours 06/12/22 1430 06/13/22 0747   06/13/22 0900  cefTRIAXone (ROCEPHIN) 2 g in sodium chloride 0.9 % 100 mL IVPB  Status:  Discontinued        2 g 200 mL/hr over 30 Minutes Intravenous Every 24 hours 06/13/22 0747 06/14/22 1230   06/11/22 1145  vancomycin (VANCOREADY) IVPB 2000 mg/400 mL        2,000 mg 200 mL/hr over 120 Minutes Intravenous  Once 06/11/22 1047 06/11/22 1344   06/11/22 1145  ceFEPIme (MAXIPIME) 2 g in sodium chloride 0.9 % 100 mL IVPB  Status:  Discontinued        2 g 200 mL/hr over 30 Minutes Intravenous 2 times daily 06/11/22 1047 06/12/22 1430   06/10/22 2300  vancomycin (VANCOCIN) IVPB 1000 mg/200 mL premix  Status:  Discontinued       See Hyperspace for full Linked Orders Report.   1,000 mg 200 mL/hr over 60 Minutes Intravenous Every 48  hours 06/08/22 2307 06/09/22 0922   06/10/22 2300  vancomycin (VANCOREADY) IVPB 1250 mg/250 mL  Status:  Discontinued       See Hyperspace for full Linked Orders Report.   1,250 mg 166.7 mL/hr over 90 Minutes Intravenous Every 48 hours 06/08/22 2307 06/09/22 0922   06/10/22 1800  cephALEXin (KEFLEX) capsule 500 mg  Status:  Discontinued  500 mg Oral Every 6 hours 06/10/22 1521 06/11/22 1047   06/09/22 2200  vancomycin (VANCOREADY) IVPB 750 mg/150 mL  Status:  Discontinued        750 mg 150 mL/hr over 60 Minutes Intravenous Every 24 hours 06/09/22 0923 06/10/22 1521   06/09/22 1000  ceFEPIme (MAXIPIME) 2 g in sodium chloride 0.9 % 100 mL IVPB  Status:  Discontinued        2 g 200 mL/hr over 30 Minutes Intravenous Every 12 hours 06/09/22 0916 06/10/22 1521   06/08/22 2300  vancomycin (VANCOCIN) IVPB 1000 mg/200 mL premix  Status:  Discontinued        1,000 mg 200 mL/hr over 60 Minutes Intravenous  Once 06/08/22 2257 06/08/22 2307   06/08/22 2215  vancomycin (VANCOCIN) IVPB 1000 mg/200 mL premix       See Hyperspace for full Linked Orders Report.   1,000 mg 200 mL/hr over 60 Minutes Intravenous  Once 06/08/22 2210 06/09/22 0014   06/08/22 2215  vancomycin (VANCOREADY) IVPB 1500 mg/300 mL       See Hyperspace for full Linked Orders Report.   1,500 mg 150 mL/hr over 120 Minutes Intravenous  Once 06/08/22 2210 06/10/22 0807   06/08/22 1915  piperacillin-tazobactam (ZOSYN) IVPB 3.375 g        3.375 g 100 mL/hr over 30 Minutes Intravenous  Once 06/08/22 1909 06/08/22 1959           Data Reviewed:  I have personally reviewed the following...  CBC: Recent Labs  Lab 06/22/22 0455 06/23/22 0408 06/24/22 0358 06/25/22 0500 06/26/22 0434 06/27/22 0303  WBC 11.8* 10.4 9.8 7.6 7.0 7.4  NEUTROABS 8.8* 7.9* 7.3  --   --   --   HGB 8.3* 8.5* 8.0* 7.7* 7.7* 7.5*  HCT 29.6* 29.9* 28.0* 26.5* 26.9* 26.3*  MCV 85.8 84.9 84.6 83.3 84.1 83.5  PLT 207 237 217 211 228 [id number]   Basic  Metabolic Panel: Recent Labs  Lab 06/22/22 0455 06/23/22 0408 06/24/22 0358 06/25/22 0500 06/26/22 0434 06/27/22 0303 06/28/22 0514  NA 151* 150* 143 143 141 139 138  K 3.3* 3.6 3.3* 3.6 3.6 3.5 3.3*  CL 108 108 102 103 101 103 103  CO2 33* 30 29 28 29 27 26  $ GLUCOSE 129* 110* 96 98 101* 108* 108*  BUN 94* 88* 78* 78* 77* 71* 68*  CREATININE 2.48* 2.58* 2.62* 3.12* 3.49* 3.59* 3.62*  CALCIUM 9.1 9.0 8.7* 8.8* 9.0 8.6* 8.5*  MG 2.3 2.2 1.8 2.6* 2.5* 2.4 2.3  PHOS 3.8 4.1 4.5  --   --   --   --    GFR: Estimated Creatinine Clearance: 21 mL/min (A) (by C-G formula based on SCr of 3.62 mg/dL (H)). Liver Function Tests: No results for input(s): "AST", "ALT", "ALKPHOS", "BILITOT", "PROT", "ALBUMIN" in the last 168 hours.  No results for input(s): "LIPASE", "AMYLASE" in the last 168 hours. No results for input(s): "AMMONIA" in the last 168 hours. Coagulation Profile: No results for input(s): "INR", "PROTIME" in the last 168 hours. Cardiac Enzymes: No results for input(s): "CKTOTAL", "CKMB", "CKMBINDEX", "TROPONINI" in the last 168 hours. BNP (last 3 results) No results for input(s): "PROBNP" in the last 8760 hours. HbA1C: No results for input(s): "HGBA1C" in the last 72 hours. CBG: Recent Labs  Lab 06/23/22 2310 06/24/22 0323 06/24/22 0724 06/24/22 1147 06/24/22 1550  GLUCAP 162* 120* 116* 111* 93   Lipid Profile: No results for input(s): "CHOL", "HDL", "LDLCALC", "TRIG", "CHOLHDL", "LDLDIRECT"  in the last 72 hours. Thyroid Function Tests: No results for input(s): "TSH", "T4TOTAL", "FREET4", "T3FREE", "THYROIDAB" in the last 72 hours. Anemia Panel: No results for input(s): "VITAMINB12", "FOLATE", "FERRITIN", "TIBC", "IRON", "RETICCTPCT" in the last 72 hours. Most Recent Urinalysis On File:     Component Value Date/Time   COLORURINE YELLOW (A) 06/08/2022 2134   APPEARANCEUR CLOUDY (A) 06/08/2022 2134   APPEARANCEUR Clear 04/27/2012 0958   LABSPEC 1.038 (H) 06/08/2022  2134   LABSPEC 1.014 04/27/2012 0958   PHURINE 5.0 06/08/2022 2134   GLUCOSEU NEGATIVE 06/08/2022 2134   GLUCOSEU Negative 04/27/2012 0958   HGBUR LARGE (A) 06/08/2022 2134   BILIRUBINUR NEGATIVE 06/08/2022 2134   BILIRUBINUR neg 02/19/2021 1040   BILIRUBINUR Negative 04/27/2012 0958   KETONESUR NEGATIVE 06/08/2022 2134   PROTEINUR NEGATIVE 06/08/2022 2134   UROBILINOGEN 0.2 02/19/2021 1040   NITRITE NEGATIVE 06/08/2022 2134   LEUKOCYTESUR NEGATIVE 06/08/2022 2134   LEUKOCYTESUR Negative 04/27/2012 0958   Sepsis Labs: @LABRCNTIP$ (procalcitonin:4,lacticidven:4) Microbiology: Recent Results (from the past 240 hour(s))  Gastrointestinal Panel by PCR , Stool     Status: None   Collection Time: 06/27/22  4:34 PM   Specimen: Stool  Result Value Ref Range Status   Campylobacter species NOT DETECTED NOT DETECTED Final   Plesimonas shigelloides NOT DETECTED NOT DETECTED Final   Salmonella species NOT DETECTED NOT DETECTED Final   Yersinia enterocolitica NOT DETECTED NOT DETECTED Final   Vibrio species NOT DETECTED NOT DETECTED Final   Vibrio cholerae NOT DETECTED NOT DETECTED Final   Enteroaggregative E coli (EAEC) NOT DETECTED NOT DETECTED Final   Enteropathogenic E coli (EPEC) NOT DETECTED NOT DETECTED Final   Enterotoxigenic E coli (ETEC) NOT DETECTED NOT DETECTED Final   Shiga like toxin producing E coli (STEC) NOT DETECTED NOT DETECTED Final   Shigella/Enteroinvasive E coli (EIEC) NOT DETECTED NOT DETECTED Final   Cryptosporidium NOT DETECTED NOT DETECTED Final   Cyclospora cayetanensis NOT DETECTED NOT DETECTED Final   Entamoeba histolytica NOT DETECTED NOT DETECTED Final   Giardia lamblia NOT DETECTED NOT DETECTED Final   Adenovirus F40/41 NOT DETECTED NOT DETECTED Final   Astrovirus NOT DETECTED NOT DETECTED Final   Norovirus GI/GII NOT DETECTED NOT DETECTED Final   Rotavirus A NOT DETECTED NOT DETECTED Final   Sapovirus (I, II, IV, and V) NOT DETECTED NOT DETECTED Final     Comment: Performed at [facility], [address], [place], [place] [address]       Radiology Studies last 3 days: DG Abd 1 View  Result Date: 06/27/2022 CLINICAL DATA:  Diarrhea. EXAM: ABDOMEN - 1 VIEW COMPARISON:  Abdominal radiograph dated 06/15/2022. FINDINGS: Evaluation is limited due to body habitus. No bowel dilatation or evidence of obstruction. No obvious free air or radiopaque calculi. Right upper quadrant cholecystectomy clips. Degenerative changes of the spine. No acute osseous pathology. IMPRESSION: Nonobstructive bowel gas pattern. Electronically Signed   By: Anner Crete M.D.   On: 06/27/2022 17:46             LOS: 20 days       Emeterio Reeve, DO [facility] 06/28/2022, 1:31 PM    Dictation software may have been used to generate the above note. Typos may occur and escape review in typed/dictated notes. Please contact Dr Sheppard Coil directly for clarity if needed.  Staff may message me via secure chat in [place]  but this may not receive an immediate response,  please page me for urgent matters!  If 7PM-7AM, please  contact night coverage [email or web address]

## 2022-06-29 DIAGNOSIS — A419 Sepsis, unspecified organism: Secondary | ICD-10-CM | POA: Diagnosis not present

## 2022-06-29 DIAGNOSIS — E662 Morbid (severe) obesity with alveolar hypoventilation: Secondary | ICD-10-CM | POA: Diagnosis not present

## 2022-06-29 DIAGNOSIS — L0390 Cellulitis, unspecified: Secondary | ICD-10-CM | POA: Diagnosis not present

## 2022-06-29 DIAGNOSIS — J189 Pneumonia, unspecified organism: Secondary | ICD-10-CM | POA: Diagnosis not present

## 2022-06-29 LAB — BASIC METABOLIC PANEL
Anion gap: 9 (ref 5–15)
BUN: 63 mg/dL — ABNORMAL HIGH (ref 8–23)
CO2: 28 mmol/L (ref 22–32)
Calcium: 8.8 mg/dL — ABNORMAL LOW (ref 8.9–10.3)
Chloride: 103 mmol/L (ref 98–111)
Creatinine, Ser: 3.27 mg/dL — ABNORMAL HIGH (ref 0.44–1.00)
GFR, Estimated: 15 mL/min — ABNORMAL LOW (ref >60)
Glucose, Bld: 95 mg/dL (ref 70–99)
Potassium: 3.6 mmol/L (ref 3.5–5.1)
Sodium: 140 mmol/L (ref 135–145)

## 2022-06-29 LAB — MAGNESIUM: Magnesium: 2.4 mg/dL (ref 1.7–2.4)

## 2022-06-29 NOTE — Progress Notes (Signed)
Talked to patient about bipap. She has refused. She will have RN to call if she decides she wants to use tonight.

## 2022-06-29 NOTE — Progress Notes (Addendum)
PROGRESS NOTE    Samantha Mason   [id number] DOB: [birth year]  DOA: 06/08/2022 Date of Service: 06/29/22 PCP: Glean Hess, MD     Brief Narrative / Hospital Course:  72 y.o. female with medical history significant for Morbid obesity, chronic HFpEF,CKD stage III, chronic lower extremity lymphedema on treatment with torsemide, hypertension, asthma, stroke (1995) and obstructive sleep apnea (non compliant with cpap) to ED w/ CC fatigue, LE swelling/redness. Pt was admitted. Course complicated by CO2 narcosis, failed BiPAP requiring intubation and mechanical ventilation.  Transferred to hospitalist service on 06/24/22. Treated for Acute on Chronic HFpEF. Her respiratory failure has been treated with BiPAP, diuresis, and bronchodilators. She has been refusing BiPAP now. Currently on 2L [place]. Diuresis held, Cr worsens despite this but has stabilized, nephrology following.   1/20: Presented to ED, admitted by [facility] with Sepsis in setting of lower extremity Cellulitis, AKI, and Acute on Chronic Hypercapnic Respiratory Failure in setting AECOPD.  1/21: Added cefepime, continue vancomycin, increase dose of Cardizem for better heart rate control 1/22: Had to reduce Cardizem dose back to her home dose as blood pressure dropped 1/23: Transferred to ICU overnight due to CO2 narcosis, placed on BiPAP.  PCCM consulted.  Worsening AKI, holding diuresis.  Agitation requiring Precedex, high risk for Intubation.  Palliative Care consulted for [place] conversations.  ABX broadened back to Cefepime and Vancomycin 1/24: Pt on Bipap but awake and able to follow commands. Attempt to transition off Bipap.  Palliative Care following 1/25: Pt currently on Bipap and precedex gtt.  Required 2.5 mg iv valium overnight for delirium.  Unable to obtain CT Head due to respiratory status.  Ultimately required intubation. 1/26: Remains critically ill. Issues with vent dyssynchrony and ventilation, plan to start Nimbex gtt.  Creatinine improved with diuresis, continue with diuresis, Nephrology considering Lasix gtt. 1/27: ABG's improved on Nimbex, persistent cuff leak, exchanged ETT. Decadron started due to upper airway swelling. Will attempt to d/c Nimbex. Good diuresis with Lasix gtt with 4.5 L of UOP last 24 hrs, Creatinine improving. 1/28: Continues with good diuresis on Lasix gtt, 3L of UOP over preceding 24 hrs, creatinine continues to improve.  Vent requirements slowly improving. 1/29: No acute events overnight. Remained mechanically intubated with minimal vent settings: PEEP 5/FiO2 40%.  Remains on lasix gtt @2mg$ /hr.  Not on sedation and attempting to follow commands but extremely weak.  Will perform SBT as tolerated   1/30: No acute events overnight.  Pt awake and following commands on precedex gtt @0$ .2 mcg/kg/hr.  Lasix gtt @4$  mg/hr. Tolerating SBT 15/8 1/31: No acute events, awake and following commands on low dose precedex.  Remains on Lasix gtt @ 75m/hr,  Creatinine remains stable BUN slowly uptrending. Tolerating SBT 15/8 ~ will continue to wean PSV as tolerated ~ EXTUBATED. Lasix gtt d/c following extubation.  Following extubation, pt confirmed DNR/DNI status in presence of her son at bedside. 2/1: Tolerated [facility] overnight[facility]  Speech evaluation performed, which pt FAILED. Consult PT/OT.  Creatinine continues to improve, started on D5W @ 50 ml/hr due to Hypernatremia of 155.  Holding diuresis today 2/2: Pt febrile currently on Bipap @50$ %.  Pt awake and following commands.  Failed speech evaluation on 02/1 and declining NGT placement.  2/2: Venous [place]: No evidence of deep venous thrombosis in either lower extremity.  2/3: During goals of care discussion pt agreeable to small bore NGT placement and dialysis if needed.  She confirmed DNR/DNI  2/4: Nursing  staff reporting pt now tolerating ice chips without signs of aspiration.  Will contact speech therapy to evaluate pt again.  Dobhoff  placement pending.  No acute events overnight  2/5: back to hospitalist service 2/6-2/9: Cr not improving but has been stable -  nephrology following, pt agreeable to temporary HD if needed, nephro recs continue to monitor UOP. No acute need for dialysis. Continue to hold diuretics.    Lab Results  Component Value Date   CREATININE 3.27 (H) 06/29/2022   CREATININE 3.62 (H) 06/28/2022   CREATININE 3.59 (H) 06/27/2022    Consultants:  PCCU Nephrology  Palliative Care   Procedures: EEG 01/26 Art line insertion 01/26 CVC to L IJ 01/25 Intubation 01/25      ASSESSMENT & PLAN:   Principal Problem:   Severe sepsis ([place]) Active Problems:   Acute on chronic respiratory failure with hypoxia and hypercapnia (HCC)   Chronic diastolic CHF (congestive heart failure) (HCC)   Essential hypertension   Acute renal failure superimposed on stage 3b chronic kidney disease (HCC)   Morbid obesity with BMI of 50.0-59.9, adult (HCC)   OSA    History of stroke   Extreme obesity with alveolar hypoventilation (HCC)   Cellulitis   Acute metabolic encephalopathy   Community acquired pneumonia  Toxic Metabolic Encephalopathy likely CO2 narcosis - resolved Mental status improved with improvement in her CO2 levels and resolution of CO2 narcosis and metabolic derangements.   Acute on Chronic Kidney Injury likely secondary to severe infection/sepsis on admission, was improving then worse again, concern for volume contraction / diuresis effect  Hypernatremia - resolved Contraction Alkalosis - resolved  holding diuretics, holding IVF management per nephrology - there was some concern about possible needing dialysis but she has been stable for past few days  Request recs from nephrology re: goals/parameters for renal function which would force decision on dialysis, vs goals/parameters which would reassure for outpatient follow-up and d/c to SNF?  Follow BMP  Acute on Chronic Hypoxic and Hypercapnic  Respiratory Failure D/t Acute on Chronic HFpEF supplemental O2 to keep sats >=90%, wean as tolerated palliative care involved DNR/DNI  continue supportive care with bronchodilators  Acute on Chronic HFpEF Holding diuresis given contraction, hypernatremia, and renal failure.   Anemia of Chronic Disease Monitor CBC Aranesp weekly   Hypokalemia monitor and replete PRN       DVT prophylaxis: heparin sq Pertinent IV fluids/nutrition: none Central lines / invasive devices: none  Code Status: DNR  Current Admission Status: inpatient   TOC needs / Dispo plan: SN[facility] patient  Barriers to discharge / significant pending items: renal function precludes discharge              Subjective / Brief ROS:  Patient reports no concerns today.  Denies CP/SOB.  Pain controlled - knee soreness stable.  Denies new weakness.  Tolerating diet.  Reports no concerns w/ urination/defecation.   Family Communication: spoke on the phone w/ daughter 06/29/22 3:58 PM     Objective Findings:  Vitals:   06/29/22 0500 06/29/22 0520 06/29/22 0800 06/29/22 0836  BP:  (!) 146/68  (!) 132/46  Pulse:  87  93  Resp:  18  (!) 21  Temp:  98.4 F (36.9 C)  (!) 97.5 F (36.4 C)  TempSrc:  Oral    SpO2:  99% 98% 97%  Weight: (!) 143.2 kg     Height:       No intake or output data  in the 24 hours ending 06/29/22 1358   Filed Weights   06/27/22 0500 06/28/22 0500 06/29/22 0500  Weight: (!) 145.7 kg (!) 147.6 kg (!) 143.2 kg    Examination:  Physical Exam Constitutional:      General: She is not in acute distress.    Appearance: She is obese.  Cardiovascular:     Rate and Rhythm: Normal rate and regular rhythm.     Heart sounds: Normal heart sounds.  Pulmonary:     Effort: Pulmonary effort is normal.     Breath sounds: Normal breath sounds.  Abdominal:     Palpations: Abdomen is soft.  Musculoskeletal:        General: No swelling.  Skin:     General: Skin is warm and dry.  Neurological:     General: No focal deficit present.     Mental Status: She is alert.  Psychiatric:        Mood and Affect: Mood normal.        Behavior: Behavior normal.          Scheduled Medications:   budesonide (PULMICORT) nebulizer solution  0.5 mg Nebulization BID   darbepoetin (ARANESP) injection - NON-DIALYSIS  60 mcg Subcutaneous Q Tue-1800   feeding supplement  237 mL Oral TID BM   fluocinonide-emollient   Topical QODAY   Gerhardt's butt cream   Topical TID   heparin  5,000 Units Subcutaneous Q8H   multivitamin with minerals  1 tablet Oral Daily   pantoprazole (PROTONIX) IV  40 mg Intravenous Q24H    Continuous Infusions:  ferric gluconate (FERRLECIT) IVPB 250 mg (06/28/22 1446)    PRN Medications:  acetaminophen, diphenoxylate-atropine, docusate sodium, ipratropium-albuterol, lip balm, ondansetron **OR** ondansetron (ZOFRAN) IV, mouth rinse, mouth rinse, phenol  Antimicrobials from admission:  Anti-infectives (From admission, onward)    Start     Dose/Rate Route Frequency Ordered Stop   06/14/22 1400  ceFEPIme (MAXIPIME) 2 g in sodium chloride 0.9 % 100 mL IVPB        2 g 200 mL/hr over 30 Minutes Intravenous Every 12 hours 06/14/22 1301 06/21/22 0236   06/13/22 1000  ceFEPIme (MAXIPIME) 2 g in sodium chloride 0.9 % 100 mL IVPB  Status:  Discontinued        2 g 200 mL/hr over 30 Minutes Intravenous Every 24 hours 06/12/22 1430 06/13/22 0747   06/13/22 0900  cefTRIAXone (ROCEPHIN) 2 g in sodium chloride 0.9 % 100 mL IVPB  Status:  Discontinued        2 g 200 mL/hr over 30 Minutes Intravenous Every 24 hours 06/13/22 0747 06/14/22 1230   06/11/22 1145  vancomycin (VANCOREADY) IVPB 2000 mg/400 mL        2,000 mg 200 mL/hr over 120 Minutes Intravenous  Once 06/11/22 1047 06/11/22 1344   06/11/22 1145  ceFEPIme (MAXIPIME) 2 g in sodium chloride 0.9 % 100 mL IVPB  Status:  Discontinued        2 g 200 mL/hr over 30 Minutes  Intravenous 2 times daily 06/11/22 1047 06/12/22 1430   06/10/22 2300  vancomycin (VANCOCIN) IVPB 1000 mg/200 mL premix  Status:  Discontinued       See Hyperspace for full Linked Orders Report.   1,000 mg 200 mL/hr over 60 Minutes Intravenous Every 48 hours 06/08/22 2307 06/09/22 0922   06/10/22 2300  vancomycin (VANCOREADY) IVPB 1250 mg/250 mL  Status:  Discontinued       See Hyperspace for full Linked Orders  Report.   1,250 mg 166.7 mL/hr over 90 Minutes Intravenous Every 48 hours 06/08/22 2307 06/09/22 0922   06/10/22 1800  cephALEXin (KEFLEX) capsule 500 mg  Status:  Discontinued        500 mg Oral Every 6 hours 06/10/22 1521 06/11/22 1047   06/09/22 2200  vancomycin (VANCOREADY) IVPB 750 mg/150 mL  Status:  Discontinued        750 mg 150 mL/hr over 60 Minutes Intravenous Every 24 hours 06/09/22 0923 06/10/22 1521   06/09/22 1000  ceFEPIme (MAXIPIME) 2 g in sodium chloride 0.9 % 100 mL IVPB  Status:  Discontinued        2 g 200 mL/hr over 30 Minutes Intravenous Every 12 hours 06/09/22 0916 06/10/22 1521   06/08/22 2300  vancomycin (VANCOCIN) IVPB 1000 mg/200 mL premix  Status:  Discontinued        1,000 mg 200 mL/hr over 60 Minutes Intravenous  Once 06/08/22 2257 06/08/22 2307   06/08/22 2215  vancomycin (VANCOCIN) IVPB 1000 mg/200 mL premix       See Hyperspace for full Linked Orders Report.   1,000 mg 200 mL/hr over 60 Minutes Intravenous  Once 06/08/22 2210 06/09/22 0014   06/08/22 2215  vancomycin (VANCOREADY) IVPB 1500 mg/300 mL       See Hyperspace for full Linked Orders Report.   1,500 mg 150 mL/hr over 120 Minutes Intravenous  Once 06/08/22 2210 06/10/22 0807   06/08/22 1915  piperacillin-tazobactam (ZOSYN) IVPB 3.375 g        3.375 g 100 mL/hr over 30 Minutes Intravenous  Once 06/08/22 1909 06/08/22 1959           Data Reviewed:  I have personally reviewed the following...  CBC: Recent Labs  Lab 06/23/22 0408 06/24/22 0358 06/25/22 0500 06/26/22 0434  06/27/22 0303  WBC 10.4 9.8 7.6 7.0 7.4  NEUTROABS 7.9* 7.3  --   --   --   HGB 8.5* 8.0* 7.7* 7.7* 7.5*  HCT 29.9* 28.0* 26.5* 26.9* 26.3*  MCV 84.9 84.6 83.3 84.1 83.5  PLT 237 217 211 228 [id number]   Basic Metabolic Panel: Recent Labs  Lab 06/23/22 0408 06/24/22 0358 06/25/22 0500 06/26/22 0434 06/27/22 0303 06/28/22 0514 06/29/22 0557  NA 150* 143 143 141 139 138 140  K 3.6 3.3* 3.6 3.6 3.5 3.3* 3.6  CL 108 102 103 101 103 103 103  CO2 30 29 28 29 27 26 28  $ GLUCOSE 110* 96 98 101* 108* 108* 95  BUN 88* 78* 78* 77* 71* 68* 63*  CREATININE 2.58* 2.62* 3.12* 3.49* 3.59* 3.62* 3.27*  CALCIUM 9.0 8.7* 8.8* 9.0 8.6* 8.5* 8.8*  MG 2.2 1.8 2.6* 2.5* 2.4 2.3 2.4  PHOS 4.1 4.5  --   --   --   --   --    GFR: Estimated Creatinine Clearance: 22.8 mL/min (A) (by C-G formula based on SCr of 3.27 mg/dL (H)). Liver Function Tests: No results for input(s): "AST", "ALT", "ALKPHOS", "BILITOT", "PROT", "ALBUMIN" in the last 168 hours.  No results for input(s): "LIPASE", "AMYLASE" in the last 168 hours. No results for input(s): "AMMONIA" in the last 168 hours. Coagulation Profile: No results for input(s): "INR", "PROTIME" in the last 168 hours. Cardiac Enzymes: No results for input(s): "CKTOTAL", "CKMB", "CKMBINDEX", "TROPONINI" in the last 168 hours. BNP (last 3 results) No results for input(s): "PROBNP" in the last 8760 hours. HbA1C: No results for input(s): "HGBA1C" in the last 72 hours. CBG: Recent Labs  Lab 06/23/22 2310 06/24/22 0323 06/24/22 0724 06/24/22 1147 06/24/22 1550  GLUCAP 162* 120* 116* 111* 93   Lipid Profile: No results for input(s): "CHOL", "HDL", "LDLCALC", "TRIG", "CHOLHDL", "LDLDIRECT" in the last 72 hours. Thyroid Function Tests: No results for input(s): "TSH", "T4TOTAL", "FREET4", "T3FREE", "THYROIDAB" in the last 72 hours. Anemia Panel: No results for input(s): "VITAMINB12", "FOLATE", "FERRITIN", "TIBC", "IRON", "RETICCTPCT" in the last 72 hours. Most  Recent Urinalysis On File:     Component Value Date/Time   COLORURINE YELLOW (A) 06/08/2022 2134   APPEARANCEUR CLOUDY (A) 06/08/2022 2134   APPEARANCEUR Clear 04/27/2012 0958   LABSPEC 1.038 (H) 06/08/2022 2134   LABSPEC 1.014 04/27/2012 0958   PHURINE 5.0 06/08/2022 2134   GLUCOSEU NEGATIVE 06/08/2022 2134   GLUCOSEU Negative 04/27/2012 0958   HGBUR LARGE (A) 06/08/2022 2134   BILIRUBINUR NEGATIVE 06/08/2022 2134   BILIRUBINUR neg 02/19/2021 1040   BILIRUBINUR Negative 04/27/2012 0958   KETONESUR NEGATIVE 06/08/2022 2134   PROTEINUR NEGATIVE 06/08/2022 2134   UROBILINOGEN 0.2 02/19/2021 1040   NITRITE NEGATIVE 06/08/2022 2134   LEUKOCYTESUR NEGATIVE 06/08/2022 2134   LEUKOCYTESUR Negative 04/27/2012 0958   Sepsis Labs: @LABRCNTIP$ (procalcitonin:4,lacticidven:4) Microbiology: Recent Results (from the past 240 hour(s))  Gastrointestinal Panel by PCR , Stool     Status: None   Collection Time: 06/27/22  4:34 PM   Specimen: Stool  Result Value Ref Range Status   Campylobacter species NOT DETECTED NOT DETECTED Final   Plesimonas shigelloides NOT DETECTED NOT DETECTED Final   Salmonella species NOT DETECTED NOT DETECTED Final   Yersinia enterocolitica NOT DETECTED NOT DETECTED Final   Vibrio species NOT DETECTED NOT DETECTED Final   Vibrio cholerae NOT DETECTED NOT DETECTED Final   Enteroaggregative E coli (EAEC) NOT DETECTED NOT DETECTED Final   Enteropathogenic E coli (EPEC) NOT DETECTED NOT DETECTED Final   Enterotoxigenic E coli (ETEC) NOT DETECTED NOT DETECTED Final   Shiga like toxin producing E coli (STEC) NOT DETECTED NOT DETECTED Final   Shigella/Enteroinvasive E coli (EIEC) NOT DETECTED NOT DETECTED Final   Cryptosporidium NOT DETECTED NOT DETECTED Final   Cyclospora cayetanensis NOT DETECTED NOT DETECTED Final   Entamoeba histolytica NOT DETECTED NOT DETECTED Final   Giardia lamblia NOT DETECTED NOT DETECTED Final   Adenovirus F40/41 NOT DETECTED NOT DETECTED Final    Astrovirus NOT DETECTED NOT DETECTED Final   Norovirus GI/GII NOT DETECTED NOT DETECTED Final   Rotavirus A NOT DETECTED NOT DETECTED Final   Sapovirus (I, II, IV, and V) NOT DETECTED NOT DETECTED Final    Comment: Performed at [facility], [address] [place], [place] [address]       Radiology Studies last 3 days: DG Abd 1 View  Result Date: 06/27/2022 CLINICAL DATA:  Diarrhea. EXAM: ABDOMEN - 1 VIEW COMPARISON:  Abdominal radiograph dated 06/15/2022. FINDINGS: Evaluation is limited due to body habitus. No bowel dilatation or evidence of obstruction. No obvious free air or radiopaque calculi. Right upper quadrant cholecystectomy clips. Degenerative changes of the spine. No acute osseous pathology. IMPRESSION: Nonobstructive bowel gas pattern. Electronically Signed   By: Anner Crete M.D.   On: 06/27/2022 17:46             LOS: 21 days       Emeterio Reeve, DO [facility] 06/29/2022, 1:58 PM    Dictation software may have been used to generate the above note. Typos may occur and escape review in typed/dictated notes. Please contact Dr Sheppard Coil directly for clarity  if needed.  Staff may message me via secure chat in [place]  but this may not receive an immediate response,  please page me for urgent matters!  If 7PM-7AM, please contact night coverage [email or web address]

## 2022-06-29 NOTE — Plan of Care (Signed)

## 2022-06-29 NOTE — Progress Notes (Signed)
[facility]  PROGRESS NOTE   Subjective:   Patient seen at bedside comfortable today.  Objective:  Vital signs: Blood pressure (!) 132/46, pulse 93, temperature (!) 97.5 F (36.4 C), resp. rate (!) 21, height 5' 4"$  (1.626 m), weight (!) 143.2 kg, SpO2 97 %. No intake or output data in the 24 hours ending 06/29/22 1319 Filed Weights   06/27/22 0500 06/28/22 0500 06/29/22 0500  Weight: (!) 145.7 kg (!) 147.6 kg (!) 143.2 kg     Physical Exam: General:  No acute distress  Head:  Normocephalic, atraumatic. Moist oral mucosal membranes  Eyes:  Anicteric  Neck:  Supple  Lungs:   Clear to auscultation, normal effort  Heart:  S1S2 no rubs  Abdomen:   Soft, nontender, bowel sounds present  Extremities:  peripheral edema.  Neurologic:  Awake, alert, following commands  Skin:  No lesions  Access:     Basic Metabolic Panel: Recent Labs  Lab 06/23/22 0408 06/24/22 0358 06/25/22 0500 06/26/22 0434 06/27/22 0303 06/28/22 0514 06/29/22 0557  NA 150* 143 143 141 139 138 140  K 3.6 3.3* 3.6 3.6 3.5 3.3* 3.6  CL 108 102 103 101 103 103 103  CO2 30 29 28 29 27 26 28  $ GLUCOSE 110* 96 98 101* 108* 108* 95  BUN 88* 78* 78* 77* 71* 68* 63*  CREATININE 2.58* 2.62* 3.12* 3.49* 3.59* 3.62* 3.27*  CALCIUM 9.0 8.7* 8.8* 9.0 8.6* 8.5* 8.8*  MG 2.2 1.8 2.6* 2.5* 2.4 2.3 2.4  PHOS 4.1 4.5  --   --   --   --   --    GFR: Estimated Creatinine Clearance: 22.8 mL/min (A) (by C-G formula based on SCr of 3.27 mg/dL (H)).  Liver Function Tests: No results for input(s): "AST", "ALT", "ALKPHOS", "BILITOT", "PROT", "ALBUMIN" in the last 168 hours. No results for input(s): "LIPASE", "AMYLASE" in the last 168 hours. No results for input(s): "AMMONIA" in the last 168 hours.  CBC: Recent Labs  Lab 06/23/22 0408 06/24/22 0358 06/25/22 0500 06/26/22 0434 06/27/22 0303  WBC 10.4 9.8 7.6 7.0 7.4  NEUTROABS 7.9* 7.3  --   --   --   HGB 8.5* 8.0* 7.7* 7.7* 7.5*  HCT 29.9* 28.0*  26.5* 26.9* 26.3*  MCV 84.9 84.6 83.3 84.1 83.5  PLT 237 217 211 228 199     HbA1C: Hgb A1c MFr Bld  Date/Time Value Ref Range Status  06/08/2022 07:16 AM 5.8 (H) 4.8 - 5.6 % Final    Comment:    (NOTE) Pre diabetes:          5.7%-6.4%  Diabetes:              >6.4%  Glycemic control for   <7.0% adults with diabetes   07/15/2021 04:30 PM 5.4 4.8 - 5.6 % Final    Comment:    (NOTE) Pre diabetes:          5.7%-6.4%  Diabetes:              >6.4%  Glycemic control for   <7.0% adults with diabetes     Urinalysis: No results for input(s): "COLORURINE", "LABSPEC", "PHURINE", "GLUCOSEU", "HGBUR", "BILIRUBINUR", "KETONESUR", "PROTEINUR", "UROBILINOGEN", "NITRITE", "LEUKOCYTESUR" in the last 72 hours.  Invalid input(s): "APPERANCEUR"    Imaging: DG Abd 1 View  Result Date: 06/27/2022 CLINICAL DATA:  Diarrhea. EXAM: ABDOMEN - 1 VIEW COMPARISON:  Abdominal radiograph dated 06/15/2022. FINDINGS: Evaluation is limited due to body habitus. No bowel dilatation or  evidence of obstruction. No obvious free air or radiopaque calculi. Right upper quadrant cholecystectomy clips. Degenerative changes of the spine. No acute osseous pathology. IMPRESSION: Nonobstructive bowel gas pattern. Electronically Signed   By: Anner Crete M.D.   On: 06/27/2022 17:46     Medications:    ferric gluconate (FERRLECIT) IVPB 250 mg (06/28/22 1446)    budesonide (PULMICORT) nebulizer solution  0.5 mg Nebulization BID   darbepoetin (ARANESP) injection - NON-DIALYSIS  60 mcg Subcutaneous Q Tue-1800   feeding supplement  237 mL Oral TID BM   fluocinonide-emollient   Topical QODAY   Gerhardt's butt cream   Topical TID   heparin  5,000 Units Subcutaneous Q8H   multivitamin with minerals  1 tablet Oral Daily   pantoprazole (PROTONIX) IV  40 mg Intravenous Q24H    Assessment/ Plan:     72 year old female with past medical history including COPD with renal failure requiring 2 L oxygen as needed, chronic  diastolic heart failure, sleep apnea, morbid obesity, hypertension, and chronic kidney disease stage IV. Patient presents to the emergency department feeling sick for a few days.  She was admitted for Sepsis.  #1: Acute kidney injury on CKD: Acute kidney injury is most likely secondary to septic ATN.  Renal indices are now stable.  Continue supportive care.  #2: Anemia: Anemia secondary to chronic kidney disease.  Will continue Aranesp and IV iron.  #3: Congestive heart failure: Diuretics are on hold secondary to worsening renal indicis.  Patient is presently comfortable will maintain on fluid restriction.  #4: Hypokalemia: Potassium now improved with supplementation.    LOS: [place], [facility] 2/10/20241:19 PM

## 2022-06-30 ENCOUNTER — Inpatient Hospital Stay: Payer: Medicare Other

## 2022-06-30 DIAGNOSIS — A419 Sepsis, unspecified organism: Secondary | ICD-10-CM | POA: Diagnosis not present

## 2022-06-30 DIAGNOSIS — J189 Pneumonia, unspecified organism: Secondary | ICD-10-CM | POA: Diagnosis not present

## 2022-06-30 DIAGNOSIS — L0390 Cellulitis, unspecified: Secondary | ICD-10-CM | POA: Diagnosis not present

## 2022-06-30 DIAGNOSIS — E662 Morbid (severe) obesity with alveolar hypoventilation: Secondary | ICD-10-CM | POA: Diagnosis not present

## 2022-06-30 LAB — BLOOD GAS, ARTERIAL
Acid-Base Excess: 1.1 mmol/L (ref 0.0–2.0)
Acid-base deficit: 9.8 mmol/L — ABNORMAL HIGH (ref 0.0–2.0)
Bicarbonate: 11.9 mmol/L — ABNORMAL LOW (ref 20.0–28.0)
Bicarbonate: 31.1 mmol/L — ABNORMAL HIGH (ref 20.0–28.0)
FIO2: 40 %
O2 Content: 3 L/min
O2 Saturation: 99.1 %
O2 Saturation: 99.6 %
PEEP: 8 cmH2O
Patient temperature: 37
Patient temperature: 37
Pressure support: 20 cmH2O
pCO2 arterial: 18 mmHg — CL (ref 32–48)
pCO2 arterial: 76 mmHg (ref 32–48)
pH, Arterial: 7.43 (ref 7.35–7.45)
pH, Arterial: 7.22 — ABNORMAL LOW (ref 7.35–7.45)
pO2, Arterial: 149 mmHg — ABNORMAL HIGH (ref 83–108)
pO2, Arterial: 113 mmHg — ABNORMAL HIGH (ref 83–108)

## 2022-06-30 LAB — URINALYSIS, COMPLETE (UACMP) WITH MICROSCOPIC
Bilirubin Urine: NEGATIVE
Glucose, UA: NEGATIVE mg/dL
Ketones, ur: NEGATIVE mg/dL
Nitrite: NEGATIVE
Protein, ur: 30 mg/dL — AB
Specific Gravity, Urine: 1.013 (ref 1.005–1.030)
Squamous Epithelial / HPF: NONE SEEN /HPF (ref 0–5)
WBC, UA: >50 WBC/hpf (ref 0–5)
pH: 5 (ref 5.0–8.0)

## 2022-06-30 LAB — CBC
HCT: 30 % — ABNORMAL LOW (ref 36.0–46.0)
Hemoglobin: 8.7 g/dL — ABNORMAL LOW (ref 12.0–15.0)
MCH: 24.4 pg — ABNORMAL LOW (ref 26.0–34.0)
MCHC: 29 g/dL — ABNORMAL LOW (ref 30.0–36.0)
MCV: 84.3 fL (ref 80.0–100.0)
Platelets: 268 10*3/uL (ref 150–400)
RBC: 3.56 MIL/uL — ABNORMAL LOW (ref 3.87–5.11)
RDW: 18.1 % — ABNORMAL HIGH (ref 11.5–15.5)
WBC: 6.5 10*3/uL (ref 4.0–10.5)
nRBC: 0 % (ref 0.0–0.2)

## 2022-06-30 LAB — BASIC METABOLIC PANEL
Anion gap: 10 (ref 5–15)
BUN: 58 mg/dL — ABNORMAL HIGH (ref 8–23)
CO2: 26 mmol/L (ref 22–32)
Calcium: 9 mg/dL (ref 8.9–10.3)
Chloride: 102 mmol/L (ref 98–111)
Creatinine, Ser: 3.05 mg/dL — ABNORMAL HIGH (ref 0.44–1.00)
GFR, Estimated: 16 mL/min — ABNORMAL LOW (ref >60)
Glucose, Bld: 120 mg/dL — ABNORMAL HIGH (ref 70–99)
Potassium: 3.6 mmol/L (ref 3.5–5.1)
Sodium: 138 mmol/L (ref 135–145)

## 2022-06-30 LAB — MAGNESIUM: Magnesium: 2 mg/dL (ref 1.7–2.4)

## 2022-06-30 IMAGING — DX DG CHEST 1V PORT
1 series · 1 of 1 positions shown · non-contrast
Comparison: 03/08/2021

CLINICAL DATA: Shortness of breath

EXAM:
PORTABLE CHEST 1 VIEW

[chest ap]
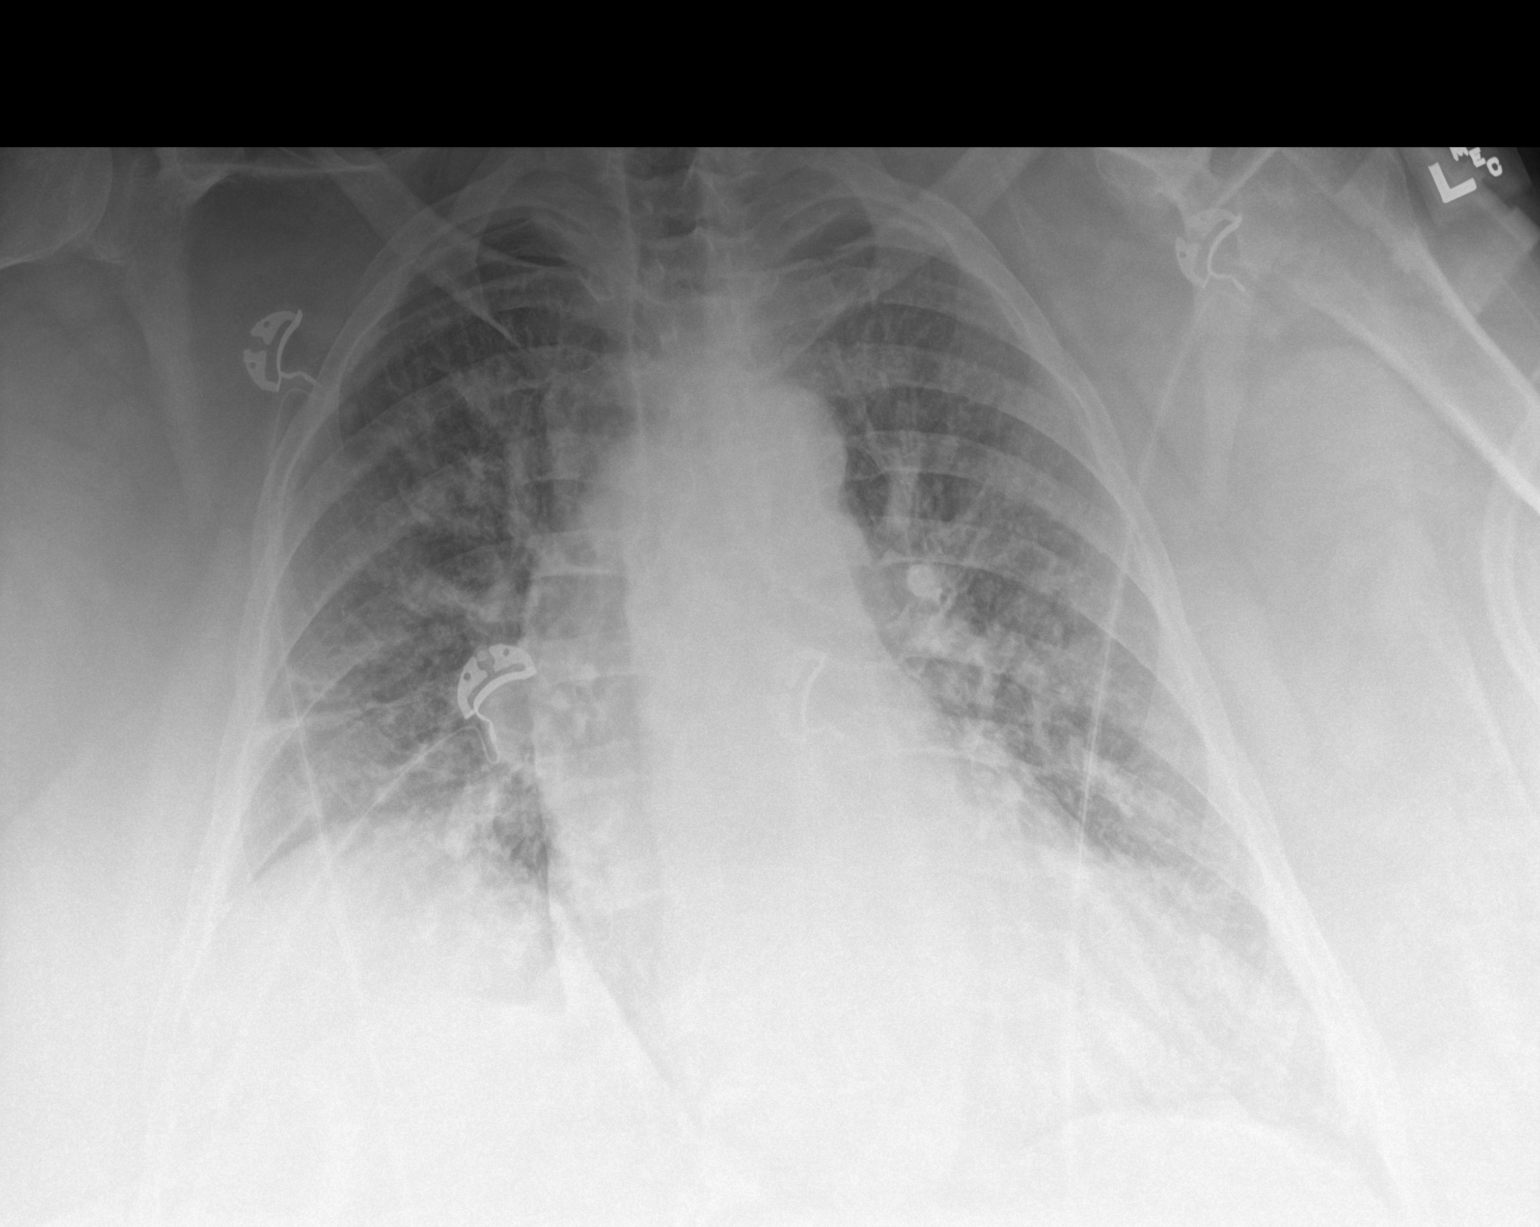

[1 of 1 positions shown; findings below may reference images not displayed]

FINDINGS: Cardiomegaly with diffuse interstitial prominence and cephalized
blood flow. Vascular pedicle widening. Mild atelectasis on the
right. Elevated right diaphragm, chronic. No visible effusion or
pneumothorax.
IMPRESSION: CHF pattern.

## 2022-06-30 MED ORDER — MELATONIN 5 MG PO TABS: 5.0000 mg | ORAL_TABLET | Freq: Every day | ORAL | Status: DC

## 2022-06-30 MED ORDER — MELATONIN 5 MG PO TABS
5.0000 mg | ORAL_TABLET | Freq: Every day | ORAL | Status: DC
  Administered 2022-06-30 – 2022-07-08 (×8): 5 mg via ORAL
  Filled 2022-06-30 (×8): qty 1

## 2022-06-30 NOTE — Progress Notes (Signed)
PROGRESS NOTE    Samantha Mason   [id number] DOB: 1950/06/04  DOA: 06/08/2022 Date of Service: 06/30/22 PCP: Glean Hess, MD     Brief Narrative / Hospital Course:  73 y.o. female with medical history significant for Morbid obesity, chronic HFpEF,CKD stage III, chronic lower extremity lymphedema on treatment with torsemide, hypertension, asthma, stroke (1995) and obstructive sleep apnea (non compliant with cpap) to ED w/ CC fatigue, LE swelling/redness. Pt was admitted. Course complicated by CO2 narcosis, failed BiPAP requiring intubation and mechanical ventilation.  Transferred to hospitalist service on 06/24/22. Treated for Acute on Chronic HFpEF. Her respiratory failure has been treated with BiPAP, diuresis, and bronchodilators. She has been refusing BiPAP now. Currently on 2L [place]. Diuresis held, Cr worsens despite this but has stabilized, nephrology following.   1/20: Presented to ED, admitted by [facility] with Sepsis in setting of lower extremity Cellulitis, AKI, and Acute on Chronic Hypercapnic Respiratory Failure in setting AECOPD.  1/21: Added cefepime, continue vancomycin, increase dose of Cardizem for better heart rate control 1/22: Had to reduce Cardizem dose back to her home dose as blood pressure dropped 1/23: Transferred to ICU overnight due to CO2 narcosis, placed on BiPAP.  PCCM consulted.  Worsening AKI, holding diuresis.  Agitation requiring Precedex, high risk for Intubation.  Palliative Care consulted for [place] conversations.  ABX broadened back to Cefepime and Vancomycin 1/24: Pt on Bipap but awake and able to follow commands. Attempt to transition off Bipap.  Palliative Care following 1/25: Pt currently on Bipap and precedex gtt.  Required 2.5 mg iv valium overnight for delirium.  Unable to obtain CT Head due to respiratory status.  Ultimately required intubation. 1/26: Remains critically ill. Issues with vent dyssynchrony and ventilation, plan to start Nimbex gtt.  Creatinine improved with diuresis, continue with diuresis, Nephrology considering Lasix gtt. 1/27: ABG's improved on Nimbex, persistent cuff leak, exchanged ETT. Decadron started due to upper airway swelling. Will attempt to d/c Nimbex. Good diuresis with Lasix gtt with 4.5 L of UOP last 24 hrs, Creatinine improving. 1/28: Continues with good diuresis on Lasix gtt, 3L of UOP over preceding 24 hrs, creatinine continues to improve.  Vent requirements slowly improving. 1/29: No acute events overnight. Remained mechanically intubated with minimal vent settings: PEEP 5/FiO2 40%.  Remains on lasix gtt @2mg$ /hr.  Not on sedation and attempting to follow commands but extremely weak.  Will perform SBT as tolerated   1/30: No acute events overnight.  Pt awake and following commands on precedex gtt @0$ .2 mcg/kg/hr.  Lasix gtt @4$  mg/hr. Tolerating SBT 15/8 1/31: No acute events, awake and following commands on low dose precedex.  Remains on Lasix gtt @ 61m/hr,  Creatinine remains stable BUN slowly uptrending. Tolerating SBT 15/8 ~ will continue to wean PSV as tolerated ~ EXTUBATED. Lasix gtt d/c following extubation.  Following extubation, pt confirmed DNR/DNI status in presence of her son at bedside. 2/1: Tolerated [facility] overnight[facility]  Speech evaluation performed, which pt FAILED. Consult PT/OT.  Creatinine continues to improve, started on D5W @ 50 ml/hr due to Hypernatremia of 155.  Holding diuresis today 2/2: Pt febrile currently on Bipap @50$ %.  Pt awake and following commands.  Failed speech evaluation on 02/1 and declining NGT placement.  2/2: Venous [place]: No evidence of deep venous thrombosis in either lower extremity.  2/3: During goals of care discussion pt agreeable to small bore NGT placement and dialysis if needed.  She confirmed DNR/DNI  2/4: Nursing  staff reporting pt now tolerating ice chips without signs of aspiration.  Will contact speech therapy to evaluate pt again.  [place]  placement pending.  No acute events overnight  2/5: back to hospitalist service 2/6-2/09: Cr not improving but has been stable -  nephrology following, pt agreeable to temporary HD if needed, nephro recs continue to monitor UOP. No acute need for dialysis. Continue to hold diuretics. Liquid stool persist, GI PCR negative, KUB no concerns.   02/10-02/11: Cr starting to improve some. 02/11 overnight insomnia, given Rx and sleep around 04:30, was a bit more confused on AM rounds likely med effect. She has been refusing BiPap   Lab Results  Component Value Date   CREATININE 3.05 (H) 06/30/2022   CREATININE 3.27 (H) 06/29/2022   CREATININE 3.62 (H) 06/28/2022    Consultants:  PCCU Nephrology  Palliative Care   Procedures: EEG 01/26 Art line insertion 01/26 CVC to L IJ 01/25 Intubation 01/25      ASSESSMENT & PLAN:   Principal Problem:   Severe sepsis ([place]) Active Problems:   Acute on chronic respiratory failure with hypoxia and hypercapnia (HCC)   Chronic diastolic CHF (congestive heart failure) (HCC)   Essential hypertension   Acute renal failure superimposed on stage 3b chronic kidney disease ([place])   Morbid obesity with BMI of 50.0-59.9, adult (HCC)   OSA    History of stroke   Extreme obesity with alveolar hypoventilation (HCC)   Cellulitis   Acute metabolic encephalopathy   Community acquired pneumonia  Toxic Metabolic Encephalopathy likely CO2 narcosis - resolved Mental status improved with improvement in her CO2 levels and resolution of CO2 narcosis and metabolic derangements.  Has been refusing BiPap and more confused this morning Will d/w patient later this afternoon - if not compliant w/ BiPap will need to discuss plan for when she likely has more issues w/ CO2 narcosis again  Acute on Chronic Kidney Injury likely secondary to severe infection/sepsis on admission, was improving then worse again, concern for volume contraction / diuresis effect  Hypernatremia -  resolved Contraction Alkalosis - resolved  holding diuretics, holding IVF management per nephrology - there was some concern about possible needing dialysis but she has been stable for past few days  Request recs from nephrology re: goals/parameters for renal function which would force decision on dialysis, vs goals/parameters which would reassure for outpatient follow-up and d/c to SNF?  Follow BMP  Acute on Chronic Hypoxic and Hypercapnic Respiratory Failure D/t Acute on Chronic HFpEF supplemental O2 to keep sats >=90%, wean as tolerated palliative care involved DNR/DNI  continue supportive care with bronchodilators  Acute on Chronic HFpEF Holding diuresis given contraction, hypernatremia, and renal failure.   Anemia of Chronic Disease Monitor CBC Aranesp weekly   Hypokalemia monitor and replete PRN       DVT prophylaxis: heparin sq Pertinent IV fluids/nutrition: none Central lines / invasive devices: none  Code Status: DNR  Current Admission Status: inpatient   TOC needs / Dispo plan: SN[facility] patient  Barriers to discharge / significant pending items: renal function precludes discharge              Subjective / Brief ROS:  Patient reports no concerns today.  Denies CP/SOB.  Pain controlled - knee soreness stable.  Denies new weakness.  Tolerating diet.  Reports no concerns w/ urination/defecation.   Family Communication: spoke on the phone w/ daughter 06/30/22 11:25 AM     Objective Findings:  Vitals:   06/30/22 0015 06/30/22 0500 06/30/22 0523 06/30/22 0838  BP: (!) 143/52  (!) 138/57 (!) 151/65  Pulse: 92  100 (!) 111  Resp: (!) 22  20 20  $ Temp: 98 F (36.7 C)  (!) 97.5 F (36.4 C) 98.6 F (37 C)  TempSrc:      SpO2: 100%  93% 99%  Weight:  (!) 146.7 kg    Height:        Intake/Output Summary (Last 24 hours) at 06/30/2022 1125 Last data filed at 06/30/2022 0900 Gross per 24 hour  Intake 330 ml  Output 300  ml  Net 30 ml     Filed Weights   06/28/22 0500 06/29/22 0500 06/30/22 0500  Weight: (!) 147.6 kg (!) 143.2 kg (!) 146.7 kg    Examination:  Physical Exam Constitutional:      General: She is not in acute distress.    Appearance: She is obese.  Cardiovascular:     Rate and Rhythm: Normal rate and regular rhythm.     Heart sounds: Normal heart sounds.  Pulmonary:     Effort: Pulmonary effort is normal.     Breath sounds: Normal breath sounds.  Abdominal:     Palpations: Abdomen is soft.  Musculoskeletal:        General: No swelling.  Skin:    General: Skin is warm and dry.  Neurological:     General: No focal deficit present.     Mental Status: She is alert.  Psychiatric:        Behavior: Behavior normal.          Scheduled Medications:   budesonide (PULMICORT) nebulizer solution  0.5 mg Nebulization BID   darbepoetin (ARANESP) injection - NON-DIALYSIS  60 mcg Subcutaneous Q Tue-1800   feeding supplement  237 mL Oral TID BM   fluocinonide-emollient   Topical QODAY   Gerhardt's butt cream   Topical TID   heparin  5,000 Units Subcutaneous Q8H   melatonin  5 mg Oral QHS   multivitamin with minerals  1 tablet Oral Daily   pantoprazole (PROTONIX) IV  40 mg Intravenous Q24H    Continuous Infusions:  ferric gluconate (FERRLECIT) IVPB Stopped (06/28/22 1646)    PRN Medications:  acetaminophen, diphenoxylate-atropine, docusate sodium, ipratropium-albuterol, lip balm, ondansetron **OR** ondansetron (ZOFRAN) IV, mouth rinse, mouth rinse, phenol  Antimicrobials from admission:  Anti-infectives (From admission, onward)    Start     Dose/Rate Route Frequency Ordered Stop   06/14/22 1400  ceFEPIme (MAXIPIME) 2 g in sodium chloride 0.9 % 100 mL IVPB        2 g 200 mL/hr over 30 Minutes Intravenous Every 12 hours 06/14/22 1301 06/21/22 0236   06/13/22 1000  ceFEPIme (MAXIPIME) 2 g in sodium chloride 0.9 % 100 mL IVPB  Status:  Discontinued        2 g 200 mL/hr over  30 Minutes Intravenous Every 24 hours 06/12/22 1430 06/13/22 0747   06/13/22 0900  cefTRIAXone (ROCEPHIN) 2 g in sodium chloride 0.9 % 100 mL IVPB  Status:  Discontinued        2 g 200 mL/hr over 30 Minutes Intravenous Every 24 hours 06/13/22 0747 06/14/22 1230   06/11/22 1145  vancomycin (VANCOREADY) IVPB 2000 mg/400 mL        2,000 mg 200 mL/hr over 120 Minutes Intravenous  Once 06/11/22 1047 06/11/22 1344   06/11/22 1145  ceFEPIme (MAXIPIME) 2 g in sodium chloride 0.9 % 100 mL IVPB  Status:  Discontinued        2 g 200 mL/hr over 30 Minutes Intravenous 2 times daily 06/11/22 1047 06/12/22 1430   06/10/22 2300  vancomycin (VANCOCIN) IVPB 1000 mg/200 mL premix  Status:  Discontinued       See Hyperspace for full Linked Orders Report.   1,000 mg 200 mL/hr over 60 Minutes Intravenous Every 48 hours 06/08/22 2307 06/09/22 0922   06/10/22 2300  vancomycin (VANCOREADY) IVPB 1250 mg/250 mL  Status:  Discontinued       See Hyperspace for full Linked Orders Report.   1,250 mg 166.7 mL/hr over 90 Minutes Intravenous Every 48 hours 06/08/22 2307 06/09/22 0922   06/10/22 1800  cephALEXin (KEFLEX) capsule 500 mg  Status:  Discontinued        500 mg Oral Every 6 hours 06/10/22 1521 06/11/22 1047   06/09/22 2200  vancomycin (VANCOREADY) IVPB 750 mg/150 mL  Status:  Discontinued        750 mg 150 mL/hr over 60 Minutes Intravenous Every 24 hours 06/09/22 0923 06/10/22 1521   06/09/22 1000  ceFEPIme (MAXIPIME) 2 g in sodium chloride 0.9 % 100 mL IVPB  Status:  Discontinued        2 g 200 mL/hr over 30 Minutes Intravenous Every 12 hours 06/09/22 0916 06/10/22 1521   06/08/22 2300  vancomycin (VANCOCIN) IVPB 1000 mg/200 mL premix  Status:  Discontinued        1,000 mg 200 mL/hr over 60 Minutes Intravenous  Once 06/08/22 2257 06/08/22 2307   06/08/22 2215  vancomycin (VANCOCIN) IVPB 1000 mg/200 mL premix       See Hyperspace for full Linked Orders Report.   1,000 mg 200 mL/hr over 60 Minutes Intravenous   Once 06/08/22 2210 06/09/22 0014   06/08/22 2215  vancomycin (VANCOREADY) IVPB 1500 mg/300 mL       See Hyperspace for full Linked Orders Report.   1,500 mg 150 mL/hr over 120 Minutes Intravenous  Once 06/08/22 2210 06/10/22 0807   06/08/22 1915  piperacillin-tazobactam (ZOSYN) IVPB 3.375 g        3.375 g 100 mL/hr over 30 Minutes Intravenous  Once 06/08/22 1909 06/08/22 1959           Data Reviewed:  I have personally reviewed the following...  CBC: Recent Labs  Lab 06/24/22 0358 06/25/22 0500 06/26/22 0434 06/27/22 0303 06/30/22 0424  WBC 9.8 7.6 7.0 7.4 6.5  NEUTROABS 7.3  --   --   --   --   HGB 8.0* 7.7* 7.7* 7.5* 8.7*  HCT 28.0* 26.5* 26.9* 26.3* 30.0*  MCV 84.6 83.3 84.1 83.5 84.3  PLT 217 211 228 199 [id number]   Basic Metabolic Panel: Recent Labs  Lab 06/24/22 0358 06/25/22 0500 06/26/22 0434 06/27/22 0303 06/28/22 0514 06/29/22 0557 06/30/22 0424  NA 143   < > 141 139 138 140 138  K 3.3*   < > 3.6 3.5 3.3* 3.6 3.6  CL 102   < > 101 103 103 103 102  CO2 29   < > 29 27 26 28 26  $ GLUCOSE 96   < > 101* 108* 108* 95 120*  BUN 78*   < > 77* 71* 68* 63* 58*  CREATININE 2.62*   < > 3.49* 3.59* 3.62* 3.27* 3.05*  CALCIUM 8.7*   < > 9.0 8.6* 8.5* 8.8* 9.0  MG 1.8   < > 2.5* 2.4 2.3 2.4 2.0  PHOS 4.5  --   --   --   --   --   --    < > =  values in this interval not displayed.   GFR: Estimated Creatinine Clearance: 24.8 mL/min (A) (by C-G formula based on SCr of 3.05 mg/dL (H)). Liver Function Tests: No results for input(s): "AST", "ALT", "ALKPHOS", "BILITOT", "PROT", "ALBUMIN" in the last 168 hours.  No results for input(s): "LIPASE", "AMYLASE" in the last 168 hours. No results for input(s): "AMMONIA" in the last 168 hours. Coagulation Profile: No results for input(s): "INR", "PROTIME" in the last 168 hours. Cardiac Enzymes: No results for input(s): "CKTOTAL", "CKMB", "CKMBINDEX", "TROPONINI" in the last 168 hours. BNP (last 3 results) No results for  input(s): "PROBNP" in the last 8760 hours. HbA1C: No results for input(s): "HGBA1C" in the last 72 hours. CBG: Recent Labs  Lab 06/23/22 2310 06/24/22 0323 06/24/22 0724 06/24/22 1147 06/24/22 1550  GLUCAP 162* 120* 116* 111* 93   Lipid Profile: No results for input(s): "CHOL", "HDL", "LDLCALC", "TRIG", "CHOLHDL", "LDLDIRECT" in the last 72 hours. Thyroid Function Tests: No results for input(s): "TSH", "T4TOTAL", "FREET4", "T3FREE", "THYROIDAB" in the last 72 hours. Anemia Panel: No results for input(s): "VITAMINB12", "FOLATE", "FERRITIN", "TIBC", "IRON", "RETICCTPCT" in the last 72 hours. Most Recent Urinalysis On File:     Component Value Date/Time   COLORURINE YELLOW (A) 06/08/2022 2134   APPEARANCEUR CLOUDY (A) 06/08/2022 2134   APPEARANCEUR Clear 04/27/2012 0958   LABSPEC 1.038 (H) 06/08/2022 2134   LABSPEC 1.014 04/27/2012 0958   PHURINE 5.0 06/08/2022 2134   GLUCOSEU NEGATIVE 06/08/2022 2134   GLUCOSEU Negative 04/27/2012 0958   HGBUR LARGE (A) 06/08/2022 2134   BILIRUBINUR NEGATIVE 06/08/2022 2134   BILIRUBINUR neg 02/19/2021 1040   BILIRUBINUR Negative 04/27/2012 0958   KETONESUR NEGATIVE 06/08/2022 2134   PROTEINUR NEGATIVE 06/08/2022 2134   UROBILINOGEN 0.2 02/19/2021 1040   NITRITE NEGATIVE 06/08/2022 2134   LEUKOCYTESUR NEGATIVE 06/08/2022 2134   LEUKOCYTESUR Negative 04/27/2012 0958   Sepsis Labs: @LABRCNTIP$ (procalcitonin:4,lacticidven:4) Microbiology: Recent Results (from the past 240 hour(s))  Gastrointestinal Panel by PCR , Stool     Status: None   Collection Time: 06/27/22  4:34 PM   Specimen: Stool  Result Value Ref Range Status   Campylobacter species NOT DETECTED NOT DETECTED Final   Plesimonas shigelloides NOT DETECTED NOT DETECTED Final   Salmonella species NOT DETECTED NOT DETECTED Final   Yersinia enterocolitica NOT DETECTED NOT DETECTED Final   Vibrio species NOT DETECTED NOT DETECTED Final   Vibrio cholerae NOT DETECTED NOT DETECTED  Final   Enteroaggregative E coli (EAEC) NOT DETECTED NOT DETECTED Final   Enteropathogenic E coli (EPEC) NOT DETECTED NOT DETECTED Final   Enterotoxigenic E coli (ETEC) NOT DETECTED NOT DETECTED Final   Shiga like toxin producing E coli (STEC) NOT DETECTED NOT DETECTED Final   Shigella/Enteroinvasive E coli (EIEC) NOT DETECTED NOT DETECTED Final   Cryptosporidium NOT DETECTED NOT DETECTED Final   Cyclospora cayetanensis NOT DETECTED NOT DETECTED Final   Entamoeba histolytica NOT DETECTED NOT DETECTED Final   Giardia lamblia NOT DETECTED NOT DETECTED Final   Adenovirus F40/41 NOT DETECTED NOT DETECTED Final   Astrovirus NOT DETECTED NOT DETECTED Final   Norovirus GI/GII NOT DETECTED NOT DETECTED Final   Rotavirus A NOT DETECTED NOT DETECTED Final   Sapovirus (I, II, IV, and V) NOT DETECTED NOT DETECTED Final    Comment: Performed at [facility], [address] [place], [place] [address]       Radiology Studies last 3 days: DG Abd 1 View  Result Date: 06/27/2022 CLINICAL DATA:  Diarrhea. EXAM: ABDOMEN -  1 VIEW COMPARISON:  Abdominal radiograph dated 06/15/2022. FINDINGS: Evaluation is limited due to body habitus. No bowel dilatation or evidence of obstruction. No obvious free air or radiopaque calculi. Right upper quadrant cholecystectomy clips. Degenerative changes of the spine. No acute osseous pathology. IMPRESSION: Nonobstructive bowel gas pattern. Electronically Signed   By: Anner Crete M.D.   On: 06/27/2022 17:46             LOS: 22 days       Emeterio Reeve, DO [facility] 06/30/2022, 11:25 AM    Dictation software may have been used to generate the above note. Typos may occur and escape review in typed/dictated notes. Please contact Dr Sheppard Coil directly for clarity if needed.  Staff may message me via secure chat in [place]  but this may not receive an immediate response,  please page me for urgent matters!  If 7PM-7AM, please contact  night coverage [email or web address]

## 2022-06-30 NOTE — Progress Notes (Signed)
Patient had a restlessness night, was not able to sleep or get comfortable regardless of measures done. Provider was contacted, med given as per order. Patient finally fell asleep this morning around 0430.

## 2022-06-30 NOTE — Progress Notes (Signed)
[facility]  PROGRESS NOTE   Subjective:   Seen at bedside.  Comfortable.  Denies any chest pain or shortness of breath.  Objective:  Vital signs: Blood pressure (!) 151/65, pulse (!) 111, temperature 98.6 F (37 C), resp. rate 20, height 5' 4"$  (1.626 m), weight (!) 146.7 kg, SpO2 99 %.  Intake/Output Summary (Last 24 hours) at 06/30/2022 1209 Last data filed at 06/30/2022 0900 Gross per 24 hour  Intake 330 ml  Output 300 ml  Net 30 ml   Filed Weights   06/28/22 0500 06/29/22 0500 06/30/22 0500  Weight: (!) 147.6 kg (!) 143.2 kg (!) 146.7 kg     Physical Exam: General:  No acute distress  Head:  Normocephalic, atraumatic. Moist oral mucosal membranes  Eyes:  Anicteric  Neck:  Supple  Lungs:   Clear to auscultation, normal effort  Heart:  S1S2 no rubs  Abdomen:   Soft, nontender, bowel sounds present  Extremities:  peripheral edema.  Neurologic:  Awake, alert, following commands  Skin:  No lesions  Access:     Basic Metabolic Panel: Recent Labs  Lab 06/24/22 0358 06/25/22 0500 06/26/22 0434 06/27/22 0303 06/28/22 0514 06/29/22 0557 06/30/22 0424  NA 143   < > 141 139 138 140 138  K 3.3*   < > 3.6 3.5 3.3* 3.6 3.6  CL 102   < > 101 103 103 103 102  CO2 29   < > 29 27 26 28 26  $ GLUCOSE 96   < > 101* 108* 108* 95 120*  BUN 78*   < > 77* 71* 68* 63* 58*  CREATININE 2.62*   < > 3.49* 3.59* 3.62* 3.27* 3.05*  CALCIUM 8.7*   < > 9.0 8.6* 8.5* 8.8* 9.0  MG 1.8   < > 2.5* 2.4 2.3 2.4 2.0  PHOS 4.5  --   --   --   --   --   --    < > = values in this interval not displayed.   GFR: Estimated Creatinine Clearance: 24.8 mL/min (A) (by C-G formula based on SCr of 3.05 mg/dL (H)).  Liver Function Tests: No results for input(s): "AST", "ALT", "ALKPHOS", "BILITOT", "PROT", "ALBUMIN" in the last 168 hours. No results for input(s): "LIPASE", "AMYLASE" in the last 168 hours. No results for input(s): "AMMONIA" in the last 168 hours.  CBC: Recent Labs  Lab  06/24/22 0358 06/25/22 0500 06/26/22 0434 06/27/22 0303 06/30/22 0424  WBC 9.8 7.6 7.0 7.4 6.5  NEUTROABS 7.3  --   --   --   --   HGB 8.0* 7.7* 7.7* 7.5* 8.7*  HCT 28.0* 26.5* 26.9* 26.3* 30.0*  MCV 84.6 83.3 84.1 83.5 84.3  PLT 217 211 228 199 268     HbA1C: Hgb A1c MFr Bld  Date/Time Value Ref Range Status  06/08/2022 07:16 AM 5.8 (H) 4.8 - 5.6 % Final    Comment:    (NOTE) Pre diabetes:          5.7%-6.4%  Diabetes:              >6.4%  Glycemic control for   <7.0% adults with diabetes   07/15/2021 04:30 PM 5.4 4.8 - 5.6 % Final    Comment:    (NOTE) Pre diabetes:          5.7%-6.4%  Diabetes:              >6.4%  Glycemic control for   <7.0% adults  with diabetes     Urinalysis: No results for input(s): "COLORURINE", "LABSPEC", "PHURINE", "GLUCOSEU", "HGBUR", "BILIRUBINUR", "KETONESUR", "PROTEINUR", "UROBILINOGEN", "NITRITE", "LEUKOCYTESUR" in the last 72 hours.  Invalid input(s): "APPERANCEUR"    Imaging: No results found.   Medications:    ferric gluconate (FERRLECIT) IVPB Stopped (06/28/22 1646)    budesonide (PULMICORT) nebulizer solution  0.5 mg Nebulization BID   darbepoetin (ARANESP) injection - NON-DIALYSIS  60 mcg Subcutaneous Q Tue-1800   feeding supplement  237 mL Oral TID BM   fluocinonide-emollient   Topical QODAY   Gerhardt's butt cream   Topical TID   heparin  5,000 Units Subcutaneous Q8H   melatonin  5 mg Oral QHS   multivitamin with minerals  1 tablet Oral Daily   pantoprazole (PROTONIX) IV  40 mg Intravenous Q24H    Assessment/ Plan:     72 year old female with past medical history including COPD with renal failure requiring 2 L oxygen as needed, chronic diastolic heart failure, sleep apnea, morbid obesity, hypertension, and chronic kidney disease stage IV. Patient presents to the emergency department feeling sick for a few days.  She was admitted for Sepsis.   #1: Acute kidney injury on CKD: Acute kidney injury is most likely  secondary to septic ATN.  Renal indices are slowly but steadily improving.  Continue supportive care.   #2: Anemia: Anemia secondary to chronic kidney disease.  Will continue Aranesp and IV iron.   #3: Congestive heart failure: Diuretics are on hold secondary to worsening renal indicis.  Patient is presently comfortable will maintain on fluid restriction.   #4: Hypokalemia: Potassium now improved with supplementation.   LOS: [place] 2/11/202412:09 PM

## 2022-06-30 NOTE — Plan of Care (Signed)
  Problem: Fluid Volume: Goal: Hemodynamic stability will improve Outcome: Progressing   Problem: Respiratory: Goal: Ability to maintain adequate ventilation will improve Outcome: Progressing   Problem: Activity: Goal: Risk for activity intolerance will decrease Outcome: Progressing   Problem: Nutrition: Goal: Adequate nutrition will be maintained Outcome: Progressing   Problem: Coping: Goal: Level of anxiety will decrease Outcome: Progressing   Problem: Skin Integrity: Goal: Risk for impaired skin integrity will decrease Outcome: Progressing

## 2022-07-01 DIAGNOSIS — Z7189 Other specified counseling: Secondary | ICD-10-CM | POA: Diagnosis not present

## 2022-07-01 DIAGNOSIS — E662 Morbid (severe) obesity with alveolar hypoventilation: Secondary | ICD-10-CM | POA: Diagnosis not present

## 2022-07-01 DIAGNOSIS — A419 Sepsis, unspecified organism: Secondary | ICD-10-CM | POA: Diagnosis not present

## 2022-07-01 DIAGNOSIS — J189 Pneumonia, unspecified organism: Secondary | ICD-10-CM | POA: Diagnosis not present

## 2022-07-01 DIAGNOSIS — L0390 Cellulitis, unspecified: Secondary | ICD-10-CM | POA: Diagnosis not present

## 2022-07-01 LAB — MAGNESIUM: Magnesium: 2 mg/dL (ref 1.7–2.4)

## 2022-07-01 LAB — BASIC METABOLIC PANEL
Anion gap: 10 (ref 5–15)
BUN: 53 mg/dL — ABNORMAL HIGH (ref 8–23)
CO2: 29 mmol/L (ref 22–32)
Calcium: 9 mg/dL (ref 8.9–10.3)
Chloride: 103 mmol/L (ref 98–111)
Creatinine, Ser: 2.85 mg/dL — ABNORMAL HIGH (ref 0.44–1.00)
GFR, Estimated: 17 mL/min — ABNORMAL LOW (ref >60)
Glucose, Bld: 94 mg/dL (ref 70–99)
Potassium: 3.7 mmol/L (ref 3.5–5.1)
Sodium: 142 mmol/L (ref 135–145)

## 2022-07-01 LAB — CBC
HCT: 32.4 % — ABNORMAL LOW (ref 36.0–46.0)
Hemoglobin: 9.1 g/dL — ABNORMAL LOW (ref 12.0–15.0)
MCH: 24.3 pg — ABNORMAL LOW (ref 26.0–34.0)
MCHC: 28.1 g/dL — ABNORMAL LOW (ref 30.0–36.0)
MCV: 86.4 fL (ref 80.0–100.0)
Platelets: 294 10*3/uL (ref 150–400)
RBC: 3.75 MIL/uL — ABNORMAL LOW (ref 3.87–5.11)
RDW: 18.3 % — ABNORMAL HIGH (ref 11.5–15.5)
WBC: 6.2 10*3/uL (ref 4.0–10.5)
nRBC: 0 % (ref 0.0–0.2)

## 2022-07-01 LAB — BLOOD GAS, ARTERIAL
Acid-Base Excess: 3.3 mmol/L — ABNORMAL HIGH (ref 0.0–2.0)
Bicarbonate: 32.9 mmol/L — ABNORMAL HIGH (ref 20.0–28.0)
O2 Saturation: 99.5 %
Patient temperature: 37
pCO2 arterial: 75 mmHg (ref 32–48)
pH, Arterial: 7.25 — ABNORMAL LOW (ref 7.35–7.45)
pO2, Arterial: 102 mmHg (ref 83–108)

## 2022-07-01 LAB — GLUCOSE, CAPILLARY: Glucose-Capillary: 91 mg/dL (ref 70–99)

## 2022-07-01 MED ORDER — VANCOMYCIN HCL 2000 MG/400ML IV SOLN
2000.0000 mg | Freq: Once | INTRAVENOUS | Status: AC
  Administered 2022-07-01: 2000 mg via INTRAVENOUS
  Filled 2022-07-01: qty 400

## 2022-07-01 MED ORDER — VANCOMYCIN VARIABLE DOSE PER UNSTABLE RENAL FUNCTION (PHARMACIST DOSING): Status: DC

## 2022-07-01 MED ORDER — SODIUM CHLORIDE 0.9 % IV SOLN
2.0000 g | INTRAVENOUS | Status: DC
  Administered 2022-07-01 – 2022-07-02 (×2): 2 g via INTRAVENOUS
  Filled 2022-07-01 (×2): qty 12.5

## 2022-07-01 NOTE — Progress Notes (Signed)
[facility]  ROUNDING NOTE   Subjective:   Samantha Mason  is a 72 year old female with past medical history including COPD with renal failure requiring 2 L oxygen as needed, chronic diastolic heart failure, sleep apnea, morbid obesity, hypertension, and chronic kidney disease stage IV. Patient presents to the emergency department feeling sick for a few days.  She was admitted for Sepsis [facility]) [A41.9] Cellulitis, unspecified cellulitis site [L03.90] Community acquired pneumonia, unspecified laterality [J18.9] Sepsis, due to unspecified organism, unspecified whether acute organ dysfunction present [facility]) [A41.9]  Patient is followed by our office outpatient. She was last seen in office on Apr 17, 2022.   Patient seen laying in bed Alert and oriented to self and place Moments of confusion and disorganized thoughts Appetite remains poor, untouched breakfast tray at bedside   02/11 0701 - 02/12 0700 In: 60 [P.O.:60] Out: -  Lab Results  Component Value Date   CREATININE 2.85 (H) 07/01/2022   CREATININE 3.05 (H) 06/30/2022   CREATININE 3.27 (H) 06/29/2022      Objective:  Vital signs in last 24 hours:  Temp:  [98 F (36.7 C)-98.8 F (37.1 C)] 98.8 F (37.1 C) (02/12 1129) Pulse Rate:  [94-114] 101 (02/12 1129) Resp:  [18-24] 18 (02/12 1129) BP: (133-146)/(51-78) 146/78 (02/12 1129) SpO2:  [98 %-100 %] 100 % (02/12 1129) FiO2 (%):  [36 %] 36 % (02/12 0223)  Weight change:  Filed Weights   06/28/22 0500 06/29/22 0500 06/30/22 0500  Weight: (!) 147.6 kg (!) 143.2 kg (!) 146.7 kg    Intake/Output: I/O last 3 completed shifts: In: 97 [P.O.:60] Out: 300 [Urine:300]   Intake/Output this shift:  No intake/output data recorded.  Physical Exam: General: NAD  Head: Normocephalic, atraumatic.    Eyes: Anicteric  Lungs:  Clear to auscultation, Normal effort, Robinson O2  Heart: Regular rate and rhythm  Abdomen:  Soft, nontender  Extremities: No peripheral edema.   Neurologic: Alert and oriented  Skin: No lesions  Access: No hemodialysis access    Basic Metabolic Panel: Recent Labs  Lab 06/27/22 0303 06/28/22 0514 06/29/22 0557 06/30/22 0424 07/01/22 0406  NA 139 138 140 138 142  K 3.5 3.3* 3.6 3.6 3.7  CL 103 103 103 102 103  CO2 27 26 28 26 29  $ GLUCOSE 108* 108* 95 120* 94  BUN 71* 68* 63* 58* 53*  CREATININE 3.59* 3.62* 3.27* 3.05* 2.85*  CALCIUM 8.6* 8.5* 8.8* 9.0 9.0  MG 2.4 2.3 2.4 2.0 2.0     Liver Function Tests: No results for input(s): "AST", "ALT", "ALKPHOS", "BILITOT", "PROT", "ALBUMIN" in the last 168 hours.  No results for input(s): "LIPASE", "AMYLASE" in the last 168 hours. No results for input(s): "AMMONIA" in the last 168 hours.   CBC: Recent Labs  Lab 06/25/22 0500 06/26/22 0434 06/27/22 0303 06/30/22 0424 07/01/22 1047  WBC 7.6 7.0 7.4 6.5 6.2  HGB 7.7* 7.7* 7.5* 8.7* 9.1*  HCT 26.5* 26.9* 26.3* 30.0* 32.4*  MCV 83.3 84.1 83.5 84.3 86.4  PLT 211 228 199 268 294     Cardiac Enzymes: No results for input(s): "CKTOTAL", "CKMB", "CKMBINDEX", "TROPONINI" in the last 168 hours.  BNP: Invalid input(s): "POCBNP"  CBG: Recent Labs  Lab 06/24/22 1550 07/01/22 0506  GLUCAP 93 91     Microbiology: Results for orders placed or performed during the hospital encounter of 06/08/22  Blood Culture (routine x 2)     Status: None   Collection Time: 06/08/22  7:18 PM  Specimen: BLOOD  Result Value Ref Range Status   Specimen Description BLOOD BLOOD LEFT ARM  Final   Special Requests   Final    BOTTLES DRAWN AEROBIC AND ANAEROBIC Blood Culture adequate volume   Culture   Final    NO GROWTH 5 DAYS Performed at [facility], [address] [place], [place] [address]    Report Status 06/13/2022 FINAL  Final  Blood Culture (routine x 2)     Status: None   Collection Time: 06/08/22  7:18 PM   Specimen: BLOOD  Result Value Ref Range Status   Specimen Description BLOOD BLOOD RIGHT ARM  Final    Special Requests   Final    BOTTLES DRAWN AEROBIC AND ANAEROBIC Blood Culture adequate volume   Culture   Final    NO GROWTH 5 DAYS Performed at [facility], [address] [place], [place] [address]    Report Status 06/13/2022 FINAL  Final  Resp panel by RT-PCR (RSV, Flu A&B, Covid) Anterior Nasal Swab     Status: None   Collection Time: 06/08/22  7:18 PM   Specimen: Anterior Nasal Swab  Result Value Ref Range Status   SARS Coronavirus 2 by RT PCR NEGATIVE NEGATIVE Final    Comment: (NOTE) SARS-CoV-2 target nucleic acids are NOT DETECTED.  The SARS-CoV-2 RNA is generally detectable in upper respiratory specimens during the acute phase of infection. The lowest concentration of SARS-CoV-2 viral copies this assay can detect is 138 copies/mL. A negative result does not preclude SARS-Cov-2 infection and should not be used as the sole basis for treatment or other patient management decisions. A negative result may occur with  improper specimen collection/handling, submission of specimen other than nasopharyngeal swab, presence of viral mutation(s) within the areas targeted by this assay, and inadequate number of viral copies(<138 copies/mL). A negative result must be combined with clinical observations, patient history, and epidemiological information. The expected result is Negative.  Fact Sheet for Patients:  EntrepreneurPulse.com.au  Fact Sheet for Healthcare Providers:  IncredibleEmployment.be  This test is no t yet approved or cleared by the [place] FDA and  has been authorized for detection and/or diagnosis of SARS-CoV-2 by FDA under an Emergency Use Authorization (EUA). This EUA will remain  in effect (meaning this test can be used) for the duration of the COVID-19 declaration under Section 564(b)(1) of the Act, 21 U.S.C.section 360bbb-3(b)(1), unless the authorization is terminated  or revoked sooner.        Influenza A by PCR NEGATIVE NEGATIVE Final   Influenza B by PCR NEGATIVE NEGATIVE Final    Comment: (NOTE) The Xpert Xpress SARS-CoV-2/FLU/RSV plus assay is intended as an aid in the diagnosis of influenza from Nasopharyngeal swab specimens and should not be used as a sole basis for treatment. Nasal washings and aspirates are unacceptable for Xpert Xpress SARS-CoV-2/FLU/RSV testing.  Fact Sheet for Patients: EntrepreneurPulse.com.au  Fact Sheet for Healthcare Providers: IncredibleEmployment.be  This test is not yet approved or cleared by the [place] FDA and has been authorized for detection and/or diagnosis of SARS-CoV-2 by FDA under an Emergency Use Authorization (EUA). This EUA will remain in effect (meaning this test can be used) for the duration of the COVID-19 declaration under Section 564(b)(1) of the Act, 21 U.S.C. section 360bbb-3(b)(1), unless the authorization is terminated or revoked.     Resp Syncytial Virus by PCR NEGATIVE NEGATIVE Final    Comment: (NOTE) Fact Sheet for Patients: EntrepreneurPulse.com.au  Fact Sheet for Healthcare  Providers: IncredibleEmployment.be  This test is not yet approved or cleared by the [place] and has been authorized for detection and/or diagnosis of SARS-CoV-2 by FDA under an Emergency Use Authorization (EUA). This EUA will remain in effect (meaning this test can be used) for the duration of the COVID-19 declaration under Section 564(b)(1) of the Act, 21 U.S.C. section 360bbb-3(b)(1), unless the authorization is terminated or revoked.  Performed at [facility], [address] [place], [place] [address]   Urine Culture     Status: None   Collection Time: 06/08/22  9:34 PM   Specimen: In/Out Cath Urine  Result Value Ref Range Status   Specimen Description   Final    IN/OUT CATH URINE Performed at [facility], [address] [place], [place] [address]    Special Requests   Final    NONE Performed at [facility], [address] [place], [place] [address]    Culture   Final    NO GROWTH Performed at [facility], [place] [address], [place], [place] [address]    Report Status 06/10/2022 FINAL  Final  MRSA Next Gen by PCR, Nasal     Status: None   Collection Time: 06/10/22 11:49 PM   Specimen: Nasal Mucosa; Nasal Swab  Result Value Ref Range Status   MRSA by PCR Next Gen NOT DETECTED NOT DETECTED Final    Comment: (NOTE) The GeneXpert MRSA Assay (FDA approved for NASAL specimens only), is one component of a comprehensive MRSA colonization surveillance program. It is not intended to diagnose MRSA infection nor to guide or monitor treatment for MRSA infections. Test performance is not FDA approved in patients less than 26 years old. Performed at [facility], [place], [place] [address]   Respiratory (~20 pathogens) panel by PCR     Status: None   Collection Time: 06/12/22  6:15 AM   Specimen: Nasopharyngeal Swab; Respiratory  Result Value Ref Range Status   Adenovirus NOT DETECTED NOT DETECTED Final   Coronavirus 229E NOT DETECTED NOT DETECTED Final    Comment: (NOTE) The Coronavirus on the Respiratory Panel, DOES NOT test for the novel  Coronavirus (2019 nCoV)    Coronavirus HKU1 NOT DETECTED NOT DETECTED Final   Coronavirus NL63 NOT DETECTED NOT DETECTED Final   Coronavirus OC43 NOT DETECTED NOT DETECTED Final   Metapneumovirus NOT DETECTED NOT DETECTED Final   Rhinovirus / Enterovirus NOT DETECTED NOT DETECTED Final   Influenza A NOT DETECTED NOT DETECTED Final   Influenza B NOT DETECTED NOT DETECTED Final   Parainfluenza Virus 1 NOT DETECTED NOT DETECTED Final   Parainfluenza Virus 2 NOT DETECTED NOT DETECTED Final   Parainfluenza Virus 3 NOT DETECTED NOT DETECTED Final   Parainfluenza Virus 4 NOT DETECTED NOT DETECTED Final   Respiratory Syncytial  Virus NOT DETECTED NOT DETECTED Final   Bordetella pertussis NOT DETECTED NOT DETECTED Final   Bordetella Parapertussis NOT DETECTED NOT DETECTED Final   Chlamydophila pneumoniae NOT DETECTED NOT DETECTED Final   Mycoplasma pneumoniae NOT DETECTED NOT DETECTED Final    Comment: Performed at [facility], [place]. [address] [place],  [address]  Culture, Respiratory w Gram Stain     Status: None   Collection Time: 06/17/22  8:15 AM   Specimen: Tracheal Aspirate; Respiratory  Result Value Ref Range Status   Specimen Description   Final    TRACHEAL ASPIRATE Performed at [facility], [place]  [address], [place], [place] [address]    Special Requests   Final    NONE Performed at [facility], [place], [place] [address]    Gram Stain   Final    RARE WBC PRESENT, PREDOMINANTLY PMN RARE SQUAMOUS EPITHELIAL CELLS PRESENT FEW YEAST Performed at [facility], [place] [address] [place], [place] [address]    Culture FEW CANDIDA ALBICANS  Final   Report Status 06/19/2022 FINAL  Final  Gastrointestinal Panel by PCR , Stool     Status: None   Collection Time: 06/27/22  4:34 PM   Specimen: Stool  Result Value Ref Range Status   Campylobacter species NOT DETECTED NOT DETECTED Final   Plesimonas shigelloides NOT DETECTED NOT DETECTED Final   Salmonella species NOT DETECTED NOT DETECTED Final   Yersinia enterocolitica NOT DETECTED NOT DETECTED Final   Vibrio species NOT DETECTED NOT DETECTED Final   Vibrio cholerae NOT DETECTED NOT DETECTED Final   Enteroaggregative E coli (EAEC) NOT DETECTED NOT DETECTED Final   Enteropathogenic E coli (EPEC) NOT DETECTED NOT DETECTED Final   Enterotoxigenic E coli (ETEC) NOT DETECTED NOT DETECTED Final   Shiga like toxin producing E coli (STEC) NOT DETECTED NOT DETECTED Final   Shigella/Enteroinvasive E coli (EIEC) NOT DETECTED NOT DETECTED Final   Cryptosporidium NOT DETECTED NOT DETECTED Final   Cyclospora  cayetanensis NOT DETECTED NOT DETECTED Final   Entamoeba histolytica NOT DETECTED NOT DETECTED Final   Giardia lamblia NOT DETECTED NOT DETECTED Final   Adenovirus F40/41 NOT DETECTED NOT DETECTED Final   Astrovirus NOT DETECTED NOT DETECTED Final   Norovirus GI/GII NOT DETECTED NOT DETECTED Final   Rotavirus A NOT DETECTED NOT DETECTED Final   Sapovirus (I, II, IV, and V) NOT DETECTED NOT DETECTED Final    Comment: Performed at [facility], [address] [place], [place] [address]    Coagulation Studies: No results for input(s): "LABPROT", "INR" in the last 72 hours.  Urinalysis: Recent Labs    06/30/22 1517  COLORURINE YELLOW*  LABSPEC 1.013  PHURINE 5.0  GLUCOSEU NEGATIVE  HGBUR MODERATE*  BILIRUBINUR NEGATIVE  KETONESUR NEGATIVE  PROTEINUR 30*  NITRITE NEGATIVE  LEUKOCYTESUR LARGE*      Imaging: DG Chest Port 1 View  Result Date: 06/30/2022 CLINICAL DATA:  Altered mental status, evaluate for sepsis. EXAM: PORTABLE CHEST 1 VIEW COMPARISON:  Chest x-ray June 21, 2022 FINDINGS: The cardiomediastinal silhouette is unchanged in contour. There is patchy heterogeneous opacities in bibasilar lungs. No pleural effusion or pneumothorax. The visualized upper abdomen is unremarkable. No acute osseous abnormality. IMPRESSION: Patchy heterogeneous opacities in bibasilar lungs, raising concern for multifocal infection. Electronically Signed   By: Beryle Flock M.D.   On: 06/30/2022 16:17     Medications:    ceFEPime (MAXIPIME) IV 2 g (07/01/22 1142)   ferric gluconate (FERRLECIT) IVPB Stopped (06/28/22 1646)   vancomycin       budesonide (PULMICORT) nebulizer solution  0.5 mg Nebulization BID   darbepoetin (ARANESP) injection - NON-DIALYSIS  60 mcg Subcutaneous Q Tue-1800   feeding supplement  237 mL Oral TID BM   fluocinonide-emollient   Topical QODAY   Gerhardt's butt cream   Topical TID   heparin  5,000 Units Subcutaneous Q8H   melatonin  5 mg Oral QHS    multivitamin with minerals  1 tablet Oral Daily   pantoprazole (PROTONIX) IV  40 mg Intravenous Q24H   acetaminophen, diphenoxylate-atropine, docusate sodium, ipratropium-albuterol, lip balm,  ondansetron **OR** ondansetron (ZOFRAN) IV, mouth rinse, mouth rinse, phenol  Assessment/ Plan:  Ms. Samantha Mason is a 72 y.o.  female  with past medical history including COPD with renal failure requiring 2 L oxygen as needed, chronic diastolic heart failure, sleep apnea, morbid obesity, hypertension, and chronic kidney disease stage IV. Patient presents to the emergency department feeling sick and was admitted for Sepsis [facility]) [A41.9] Cellulitis, unspecified cellulitis site [L03.90] Community acquired pneumonia, unspecified laterality [J18.9] Sepsis, due to unspecified organism, unspecified whether acute organ dysfunction present [facility]) [A41.9]   Acute Kidney Injury on chronic kidney disease stage IIIb with baseline creatinine 1.55 and GFR of 36 on 04/17/22.  Acute kidney injury secondary to severe infection. No IV contrast exposure.  Diuretics remain held. Lower extremity edema present, however patient has history of lymphedema.   Creatinine improving slowly. No urine output recorded but dark urine noted in canister. Will continue to monitor and encourage oral intake.    Lab Results  Component Value Date   CREATININE 2.85 (H) 07/01/2022   CREATININE 3.05 (H) 06/30/2022   CREATININE 3.27 (H) 06/29/2022   No intake or output data in the 24 hours ending 07/01/22 1231  2. Anemia of chronic kidney disease Lab Results  Component Value Date   HGB 9.1 (L) 07/01/2022   Hgb within desired range. Continue weekly Aranesp.   3.  Chronic diastolic heart failure.  Echo from 07/16/21 shows EF 55 to 60% with mild LVH.  Diuretics held.  4.  Acute respiratory failure.  Weaned to 2L [place], continue supportive care   LOS: [place] 2/12/202412:31 PM

## 2022-07-01 NOTE — Progress Notes (Signed)
PROGRESS NOTE    Samantha Mason   [id number] DOB: 1950-11-30  DOA: 06/08/2022 Date of Service: 07/01/22 PCP: Glean Hess, MD     Brief Narrative / Hospital Course:  72 y.o. female with medical history significant for Morbid obesity, chronic HFpEF,CKD stage III, chronic lower extremity lymphedema on treatment with torsemide, hypertension, asthma, stroke (1995) and obstructive sleep apnea (non compliant with cpap) to ED w/ CC fatigue, LE swelling/redness. Pt was admitted. Course complicated by CO2 narcosis, failed BiPAP requiring intubation and mechanical ventilation.  Transferred to hospitalist service on 06/24/22. Treated for Acute on Chronic HFpEF. Her respiratory failure has been treated with BiPAP, diuresis, and bronchodilators. She has been refusing BiPAP now. Currently on 2L [place]. Diuresis held, Cr worsens despite this but has stabilized, nephrology following.   1/20: Presented to ED, admitted by [facility] with Sepsis in setting of lower extremity Cellulitis, AKI, and Acute on Chronic Hypercapnic Respiratory Failure in setting AECOPD.  1/21: Added cefepime, continue vancomycin, increase dose of Cardizem for better heart rate control 1/22: Had to reduce Cardizem as blood pressure dropped 1/23: Transferred to ICU overnight due to CO2 narcosis, placed on BiPAP.  PCCM consulted.  Worsening AKI, holding diuresis.  Agitation requiring Precedex.  Palliative Care consulted for [place].  ABX broadened back to Cefepime and Vancomycin 1/24: Pt on Bipap but awake and able to follow commands. Attempt to transition off Bipap.  1/25: Pt currently on Bipap and precedex gtt.  Required 2.5 mg iv valium overnight for delirium.  Unable to obtain CT Head due to respiratory status.  Ultimately required intubation. 1/26: Remains critically ill. Issues with vent dyssynchrony and ventilation, plan to start Nimbex gtt. Creatinine improved with diuresis, continue with diuresis, Nephrology considering Lasix gtt. 1/27:  ABG's improved on Nimbex, persistent cuff leak, exchanged ETT. Decadron started due to upper airway swelling. Will attempt to d/c Nimbex. Good diuresis with Lasix gtt with 4.5 L of UOP last 24 hrs, Creatinine improving. 1/28: Continues with good diuresis on Lasix gtt  1/29: No acute events overnight. Remained mechanically intubated with minimal vent settings.  Remains on lasix gtt.  Not on sedation and attempting to follow commands but extremely weak.   1/30: No acute events overnight.  Pt awake and following commands on precedex gtt.  Lasix gtt. Tolerating SBT 15/8 1/31: No acute events, awake and following commands on low dose precedex.  Remains on Lasix gtt,  Creatinine remains stable BUN slowly uptrending. Tolerating SBT 15/8 ~ will continue to wean PSV as tolerated ~ EXTUBATED. Lasix gtt d/c following extubation.  Following extubation, pt confirmed DNR/DNI status in presence of her son at bedside. 2/1: Tolerated BiPAP overnight, weaned to [facility] this am.  Speech evaluation performed, which pt FAILED. Consult PT/OT.  Creatinine continues to improve, started on D5W @ 50 ml/hr due to Hypernatremia of 155.  Holding diuresis today 2/2: Pt febrile on Bipap @50$ %.  Pt awake and following commands.  Failed speech evaluation on 02/1 and declining NGT placement.  2/2: Venous US BLE: No DVT 2/3: Agreeable to small bore NGT placement and dialysis if needed.  Again confirmed DNR/DNI  2/4: Nursing staff reporting pt now tolerating ice chips without signs of aspiration. NG was not needed.  2/5: back to hospitalist service. Creatinine uptrending.  2/6-2/09: Cr not improving but has been stable -  nephrology following, pt agreeable to temporary HD if needed, nephro recs continue to monitor UOP. No acute need for dialysis. Continue to hold diuretics. Liquid stool persist,  GI PCR negative, KUB no concerns.   02/10-02/11: Cr starting to improve some.  02/11 overnight insomnia, given Rx and sleep around 04:30, was a bit  more confused on AM rounds likely med effect + hypercarbia as she has been refusing BiPap - found hypercarbic on ABG and placed back on BiPap and transferred to Progressive unit. UA also concerning for UTI, CXR concerning for PNA. (+)Sepsis criteria  02/12: Cr thankfully improving. Still confused.   Lab Results  Component Value Date   CREATININE 2.85 (H) 07/01/2022   CREATININE 3.05 (H) 06/30/2022   CREATININE 3.27 (H) 06/29/2022    Consultants:  PCCU Nephrology  Palliative Care   Procedures: EEG 01/26 Art line insertion 01/26 CVC to L IJ 01/25 Intubation 01/25      ASSESSMENT & PLAN:   Principal Problem:   Severe sepsis ([place]) Active Problems:   Acute on chronic respiratory failure with hypoxia and hypercapnia (HCC)   Chronic diastolic CHF (congestive heart failure) (HCC)   Essential hypertension   Acute renal failure superimposed on stage 3b chronic kidney disease (HCC)   Morbid obesity with BMI of 50.0-59.9, adult (HCC)   OSA    History of stroke   Extreme obesity with alveolar hypoventilation (HCC)   Cellulitis   Acute metabolic encephalopathy   Community acquired pneumonia  Toxic Metabolic Encephalopathy likely CO2 narcosis - resolved then recurred Also concern for multifocal pneumonia and UTI - see below Had been refusing BiPap x2 nights and was more confused yesterday morning, placed back on BiPap and transferred to PCU  Sepsis d/t Multifocal Pneumonia per CXR 02/11 Cefepime + vancomycin for multifocal pneumonia acquired in hospital setting  BCx pending Limit IV fluids d/t HFpEF  Sepsis d/t UTI per UA 02/11 Cefepime as above should also cover urinary pathogens UCx and BCx pending  Limit IV fluids d/t HFpEF  Acute on Chronic Kidney Injury likely secondary to severe infection/sepsis on admission, was improving then worse again, concern for volume contraction / diuresis effect - improving now Hypernatremia - resolved Contraction Alkalosis - resolved   holding diuretics, holding IVF management per nephrology - there was some concern about possible needing dialysis but she has been stable for past few days  Request recs from nephrology re: goals/parameters for renal function which would force decision on dialysis, vs goals/parameters which would reassure for outpatient follow-up and d/c to SNF?  Follow BMP  Acute on Chronic Hypoxic and Hypercapnic Respiratory Failure D/t Acute on Chronic HFpEF initially, now concern for restrictive lung disease and multifocal pneumonia  supplemental O2 to keep sats >=90%, wean as tolerated BiPap as above palliative care involved DNR/DNI  continue supportive care with bronchodilators  Acute on Chronic HFpEF - improved Holding diuresis given contraction, hypernatremia, and renal failure. Avoid aggressive IV fluids    Anemia of Chronic Disease Monitor CBC Aranesp weekly   Hypokalemia monitor and replete PRN       DVT prophylaxis: heparin sq Pertinent IV fluids/nutrition: none Central lines / invasive devices: none  Code Status: DNR  Current Admission Status: inpatient   TOC needs / Dispo plan: SNF - [facility] patient  Barriers to discharge / significant pending items: renal function was barrier, now pt is again septic w/ pneumonia and UTI             Subjective / Brief ROS:  Pt unable to meaningfully contribute. She denies SOB, I ask about BiPap overnight and she keeps repeating "I don't think we are cooperating..." and  then trails off.   Family Communication: son at bedside today     Objective Findings:  Vitals:   07/01/22 0354 07/01/22 0404 07/01/22 0801 07/01/22 1129  BP: 133/72  (!) 135/51 (!) 146/78  Pulse: 94  98 (!) 101  Resp: 20  (!) 22 18  Temp: 98.2 F (36.8 C)  98.2 F (36.8 C) 98.8 F (37.1 C)  TempSrc:    Oral  SpO2: 100% 100% 100% 100%  Weight:      Height:        Intake/Output Summary (Last 24 hours) at 07/01/2022 1411 Last data  filed at 07/01/2022 1345 Gross per 24 hour  Intake --  Output 150 ml  Net -150 ml     Filed Weights   06/28/22 0500 06/29/22 0500 06/30/22 0500  Weight: (!) 147.6 kg (!) 143.2 kg (!) 146.7 kg    Examination:  Physical Exam Constitutional:      General: She is not in acute distress.    Appearance: She is obese.  Cardiovascular:     Rate and Rhythm: Normal rate and regular rhythm.     Heart sounds: Normal heart sounds.  Pulmonary:     Effort: No respiratory distress.     Breath sounds: Normal breath sounds.  Abdominal:     Palpations: Abdomen is soft.  Musculoskeletal:        General: No swelling.     Right lower leg: No edema.     Left lower leg: No edema.  Skin:    General: Skin is warm and dry.  Neurological:     General: No focal deficit present.     Mental Status: She is alert. She is disoriented.          Scheduled Medications:   budesonide (PULMICORT) nebulizer solution  0.5 mg Nebulization BID   darbepoetin (ARANESP) injection - NON-DIALYSIS  60 mcg Subcutaneous Q Tue-1800   feeding supplement  237 mL Oral TID BM   fluocinonide-emollient   Topical QODAY   Gerhardt's butt cream   Topical TID   heparin  5,000 Units Subcutaneous Q8H   melatonin  5 mg Oral QHS   multivitamin with minerals  1 tablet Oral Daily   pantoprazole (PROTONIX) IV  40 mg Intravenous Q24H   vancomycin variable dose per unstable renal function (pharmacist dosing)   Does not apply See admin instructions    Continuous Infusions:  ceFEPime (MAXIPIME) IV 2 g (07/01/22 1142)   ferric gluconate (FERRLECIT) IVPB Stopped (06/28/22 1646)   vancomycin 2,000 mg (07/01/22 1245)    PRN Medications:  acetaminophen, diphenoxylate-atropine, docusate sodium, ipratropium-albuterol, lip balm, ondansetron **OR** ondansetron (ZOFRAN) IV, mouth rinse, mouth rinse, phenol  Antimicrobials from admission:  Anti-infectives (From admission, onward)    Start     Dose/Rate Route Frequency Ordered Stop    07/01/22 1338  vancomycin variable dose per unstable renal function (pharmacist dosing)         Does not apply See admin instructions 07/01/22 1339     07/01/22 1200  vancomycin (VANCOREADY) IVPB 2000 mg/400 mL        2,000 mg 200 mL/hr over 120 Minutes Intravenous  Once 07/01/22 0948     07/01/22 1045  ceFEPIme (MAXIPIME) 2 g in sodium chloride 0.9 % 100 mL IVPB        2 g 200 mL/hr over 30 Minutes Intravenous Every 24 hours 07/01/22 0947     06/14/22 1400  ceFEPIme (MAXIPIME) 2 g in sodium chloride 0.9 % 100  mL IVPB        2 g 200 mL/hr over 30 Minutes Intravenous Every 12 hours 06/14/22 1301 06/21/22 0236   06/13/22 1000  ceFEPIme (MAXIPIME) 2 g in sodium chloride 0.9 % 100 mL IVPB  Status:  Discontinued        2 g 200 mL/hr over 30 Minutes Intravenous Every 24 hours 06/12/22 1430 06/13/22 0747   06/13/22 0900  cefTRIAXone (ROCEPHIN) 2 g in sodium chloride 0.9 % 100 mL IVPB  Status:  Discontinued        2 g 200 mL/hr over 30 Minutes Intravenous Every 24 hours 06/13/22 0747 06/14/22 1230   06/11/22 1145  vancomycin (VANCOREADY) IVPB 2000 mg/400 mL        2,000 mg 200 mL/hr over 120 Minutes Intravenous  Once 06/11/22 1047 06/11/22 1344   06/11/22 1145  ceFEPIme (MAXIPIME) 2 g in sodium chloride 0.9 % 100 mL IVPB  Status:  Discontinued        2 g 200 mL/hr over 30 Minutes Intravenous 2 times daily 06/11/22 1047 06/12/22 1430   06/10/22 2300  vancomycin (VANCOCIN) IVPB 1000 mg/200 mL premix  Status:  Discontinued       See Hyperspace for full Linked Orders Report.   1,000 mg 200 mL/hr over 60 Minutes Intravenous Every 48 hours 06/08/22 2307 06/09/22 0922   06/10/22 2300  vancomycin (VANCOREADY) IVPB 1250 mg/250 mL  Status:  Discontinued       See Hyperspace for full Linked Orders Report.   1,250 mg 166.7 mL/hr over 90 Minutes Intravenous Every 48 hours 06/08/22 2307 06/09/22 0922   06/10/22 1800  cephALEXin (KEFLEX) capsule 500 mg  Status:  Discontinued        500 mg Oral Every 6 hours  06/10/22 1521 06/11/22 1047   06/09/22 2200  vancomycin (VANCOREADY) IVPB 750 mg/150 mL  Status:  Discontinued        750 mg 150 mL/hr over 60 Minutes Intravenous Every 24 hours 06/09/22 0923 06/10/22 1521   06/09/22 1000  ceFEPIme (MAXIPIME) 2 g in sodium chloride 0.9 % 100 mL IVPB  Status:  Discontinued        2 g 200 mL/hr over 30 Minutes Intravenous Every 12 hours 06/09/22 0916 06/10/22 1521   06/08/22 2300  vancomycin (VANCOCIN) IVPB 1000 mg/200 mL premix  Status:  Discontinued        1,000 mg 200 mL/hr over 60 Minutes Intravenous  Once 06/08/22 2257 06/08/22 2307   06/08/22 2215  vancomycin (VANCOCIN) IVPB 1000 mg/200 mL premix       See Hyperspace for full Linked Orders Report.   1,000 mg 200 mL/hr over 60 Minutes Intravenous  Once 06/08/22 2210 06/09/22 0014   06/08/22 2215  vancomycin (VANCOREADY) IVPB 1500 mg/300 mL       See Hyperspace for full Linked Orders Report.   1,500 mg 150 mL/hr over 120 Minutes Intravenous  Once 06/08/22 2210 06/10/22 0807   06/08/22 1915  piperacillin-tazobactam (ZOSYN) IVPB 3.375 g        3.375 g 100 mL/hr over 30 Minutes Intravenous  Once 06/08/22 1909 06/08/22 1959           Data Reviewed:  I have personally reviewed the following...  CBC: Recent Labs  Lab 06/25/22 0500 06/26/22 0434 06/27/22 0303 06/30/22 0424 07/01/22 1047  WBC 7.6 7.0 7.4 6.5 6.2  HGB 7.7* 7.7* 7.5* 8.7* 9.1*  HCT 26.5* 26.9* 26.3* 30.0* 32.4*  MCV 83.3 84.1 83.5 84.3 86.4  PLT 211 228 199 268 [id number]   Basic Metabolic Panel: Recent Labs  Lab 06/27/22 0303 06/28/22 0514 06/29/22 0557 06/30/22 0424 07/01/22 0406  NA 139 138 140 138 142  K 3.5 3.3* 3.6 3.6 3.7  CL 103 103 103 102 103  CO2 27 26 28 26 29  $ GLUCOSE 108* 108* 95 120* 94  BUN 71* 68* 63* 58* 53*  CREATININE 3.59* 3.62* 3.27* 3.05* 2.85*  CALCIUM 8.6* 8.5* 8.8* 9.0 9.0  MG 2.4 2.3 2.4 2.0 2.0   GFR: Estimated Creatinine Clearance: 26.5 mL/min (A) (by C-G formula based on SCr of 2.85  mg/dL (H)). Liver Function Tests: No results for input(s): "AST", "ALT", "ALKPHOS", "BILITOT", "PROT", "ALBUMIN" in the last 168 hours.  No results for input(s): "LIPASE", "AMYLASE" in the last 168 hours. No results for input(s): "AMMONIA" in the last 168 hours. Coagulation Profile: No results for input(s): "INR", "PROTIME" in the last 168 hours. Cardiac Enzymes: No results for input(s): "CKTOTAL", "CKMB", "CKMBINDEX", "TROPONINI" in the last 168 hours. BNP (last 3 results) No results for input(s): "PROBNP" in the last 8760 hours. HbA1C: No results for input(s): "HGBA1C" in the last 72 hours. CBG: Recent Labs  Lab 06/24/22 1550 07/01/22 0506  GLUCAP 93 91   Lipid Profile: No results for input(s): "CHOL", "HDL", "LDLCALC", "TRIG", "CHOLHDL", "LDLDIRECT" in the last 72 hours. Thyroid Function Tests: No results for input(s): "TSH", "T4TOTAL", "FREET4", "T3FREE", "THYROIDAB" in the last 72 hours. Anemia Panel: No results for input(s): "VITAMINB12", "FOLATE", "FERRITIN", "TIBC", "IRON", "RETICCTPCT" in the last 72 hours. Most Recent Urinalysis On File:     Component Value Date/Time   COLORURINE YELLOW (A) 06/30/2022 1517   APPEARANCEUR CLOUDY (A) 06/30/2022 1517   APPEARANCEUR Clear 04/27/2012 0958   LABSPEC 1.013 06/30/2022 1517   LABSPEC 1.014 04/27/2012 0958   PHURINE 5.0 06/30/2022 1517   GLUCOSEU NEGATIVE 06/30/2022 1517   GLUCOSEU Negative 04/27/2012 0958   HGBUR MODERATE (A) 06/30/2022 1517   BILIRUBINUR NEGATIVE 06/30/2022 1517   BILIRUBINUR neg 02/19/2021 1040   BILIRUBINUR Negative 04/27/2012 0958   KETONESUR NEGATIVE 06/30/2022 1517   PROTEINUR 30 (A) 06/30/2022 1517   UROBILINOGEN 0.2 02/19/2021 1040   NITRITE NEGATIVE 06/30/2022 1517   LEUKOCYTESUR LARGE (A) 06/30/2022 1517   LEUKOCYTESUR Negative 04/27/2012 0958   Sepsis Labs: @LABRCNTIP$ (procalcitonin:4,lacticidven:4) Microbiology: Recent Results (from the past 240 hour(s))  Gastrointestinal Panel by PCR  , Stool     Status: None   Collection Time: 06/27/22  4:34 PM   Specimen: Stool  Result Value Ref Range Status   Campylobacter species NOT DETECTED NOT DETECTED Final   Plesimonas shigelloides NOT DETECTED NOT DETECTED Final   Salmonella species NOT DETECTED NOT DETECTED Final   Yersinia enterocolitica NOT DETECTED NOT DETECTED Final   Vibrio species NOT DETECTED NOT DETECTED Final   Vibrio cholerae NOT DETECTED NOT DETECTED Final   Enteroaggregative E coli (EAEC) NOT DETECTED NOT DETECTED Final   Enteropathogenic E coli (EPEC) NOT DETECTED NOT DETECTED Final   Enterotoxigenic E coli (ETEC) NOT DETECTED NOT DETECTED Final   Shiga like toxin producing E coli (STEC) NOT DETECTED NOT DETECTED Final   Shigella/Enteroinvasive E coli (EIEC) NOT DETECTED NOT DETECTED Final   Cryptosporidium NOT DETECTED NOT DETECTED Final   Cyclospora cayetanensis NOT DETECTED NOT DETECTED Final   Entamoeba histolytica NOT DETECTED NOT DETECTED Final   Giardia lamblia NOT DETECTED NOT DETECTED Final   Adenovirus F40/41 NOT DETECTED NOT DETECTED Final   Astrovirus NOT DETECTED NOT DETECTED  Final   Norovirus GI/GII NOT DETECTED NOT DETECTED Final   Rotavirus A NOT DETECTED NOT DETECTED Final   Sapovirus (I, II, IV, and V) NOT DETECTED NOT DETECTED Final    Comment: Performed at [facility], [address], [place], [place] [address]       Radiology Studies last 3 days: [facility] Chest [facility] 1 View  Result Date: 06/30/2022 CLINICAL DATA:  Altered mental status, evaluate for sepsis. EXAM: PORTABLE CHEST 1 VIEW COMPARISON:  Chest x-ray June 21, 2022 FINDINGS: The cardiomediastinal silhouette is unchanged in contour. There is patchy heterogeneous opacities in bibasilar lungs. No pleural effusion or pneumothorax. The visualized upper abdomen is unremarkable. No acute osseous abnormality. IMPRESSION: Patchy heterogeneous opacities in bibasilar lungs, raising concern for multifocal infection. Electronically  Signed   By: Beryle Flock M.D.   On: 06/30/2022 16:17   DG Abd 1 View  Result Date: 06/27/2022 CLINICAL DATA:  Diarrhea. EXAM: ABDOMEN - 1 VIEW COMPARISON:  Abdominal radiograph dated 06/15/2022. FINDINGS: Evaluation is limited due to body habitus. No bowel dilatation or evidence of obstruction. No obvious free air or radiopaque calculi. Right upper quadrant cholecystectomy clips. Degenerative changes of the spine. No acute osseous pathology. IMPRESSION: Nonobstructive bowel gas pattern. Electronically Signed   By: Anner Crete M.D.   On: 06/27/2022 17:46             LOS: 23 days       Emeterio Reeve, DO [facility] 07/01/2022, 2:11 PM    Dictation software may have been used to generate the above note. Typos may occur and escape review in typed/dictated notes. Please contact Dr Sheppard Coil directly for clarity if needed.  Staff may message me via secure chat in [place]  but this may not receive an immediate response,  please page me for urgent matters!  If 7PM-7AM, please contact night coverage [email or web address]

## 2022-07-01 NOTE — TOC Progression Note (Signed)
Transition of Care [facility]) - Progression Note    Patient Details  Name: Samantha Mason MRN: [id number] Date of Birth: [birth year]  Transition of Care [facility]) CM/SW [place], [place] Phone Number: 07/01/2022, 2:37 PM  Clinical Narrative:     CSW has updated Ricky with [facility] that patient will be here a few more days for UTI/PNA not medically ready to dc.   Expected Discharge Plan: [place] Barriers to Discharge: Continued Medical Work up  Expected Discharge Plan and Services   Discharge Planning Services: CM Consult Post Acute Care Choice: [place] Living arrangements for the past 2 months: Single Family Home                 DME Arranged: N/A         HH Arranged: NA           Social Determinants of Health (SDOH) Interventions SDOH Screenings   Food Insecurity: No Food Insecurity (06/09/2022)  Housing: Low Risk  (06/09/2022)  Transportation Needs: No Transportation Needs (06/09/2022)  Utilities: Not At Risk (06/09/2022)  Alcohol Screen: Low Risk  (08/08/2021)  Depression (PHQ2-9): Medium Risk (02/25/2022)  Financial Resource Strain: Medium Risk (08/14/2021)  Physical Activity: Inactive (08/08/2021)  Social Connections: Socially Isolated (08/08/2021)  Stress: No Stress Concern Present (08/08/2021)  Tobacco Use: Low Risk  (06/11/2022)    Readmission Risk Interventions     No data to display

## 2022-07-01 NOTE — Consult Note (Addendum)
Pharmacy Antibiotic Note  Samantha Mason is a 72 y.o. female admitted on 06/08/2022 with  pneumonia and UTI .  Pharmacy has been consulted for Cefepime and Vancomycin dosing.  2/11: berating worsening, back on BiPaP, confused and possible UTI per MD assessment* X-ray concerning for PNA. Met sepsis criteria and Abx re-initiated.   Plan: Cefepime 2gm IVPB q 24 hrs Vancomycin 2000 mg x 1 today. Variable dosing due to changing Scr. Will re-assess in AM and determine further dosing.  Height: 5' 4"$  (162.6 cm) Weight: (!) 146.7 kg (323 lb 6.6 oz) IBW/kg (Calculated) : 54.7  Temp (24hrs), Avg:98.2 F (36.8 C), Min:98 F (36.7 C), Max:98.3 F (36.8 C)  Recent Labs  Lab 06/25/22 0500 06/26/22 0434 06/27/22 0303 06/28/22 0514 06/29/22 0557 06/30/22 0424 07/01/22 0406  WBC 7.6 7.0 7.4  --   --  6.5  --   CREATININE 3.12* 3.49* 3.59* 3.62* 3.27* 3.05* 2.85*    Estimated Creatinine Clearance: 26.5 mL/min (A) (by C-G formula based on SCr of 2.85 mg/dL (H)).    Allergies  Allergen Reactions   Nuvigil [Armodafinil] Hives   Penicillins Diarrhea and Nausea And Vomiting    Did it involve swelling of the face/tongue/throat, SOB, or low BP? no Did it involve sudden or severe rash/hives, skin peeling, or any reaction on the inside of your mouth or nose? No Did you need to seek medical attention at a hospital or doctor's office? No When did it last happen?  in her 79s    If all above answers are "NO", may proceed with cephalosporin use.    Spironolactone Itching   Gabapentin Other (See Comments)    "wiped her out, couldn't stay awake"   Melatonin Other (See Comments)    "Brain fog"   Provigil [Modafinil] Hives   Entresto [Sacubitril-Valsartan] Itching   Lisinopril Cough    Antimicrobials this admission: 2/12 Cefepime >>  2/12 Vancomycin >>   1/26 Cefepime >> 2/2 1/25 ceftriaxone 1/26  Dose adjustments this admission: n/a  Microbiology results: 2/12 BCx: sent 2/12 UCx: sent   1/31 Sputum: few candida albicans  1/23 MRSA PCR: not detected  Thank you for allowing pharmacy to be a part of this patient's care.  Kolbey Teichert Rodriguez-Guzman PharmD, BCPS 07/01/2022 9:50 AM

## 2022-07-01 NOTE — IPAL (Signed)
  Interdisciplinary Goals of Care Family Meeting   Date carried out: 07/01/2022  Location of the meeting: Bedside  Member's involved: Physician and Family Member or next of kin - son  Durable Power of Forensic psychologist or Loss adjuster, chartered: son is at bedside, pt also has daughter     Discussion: We discussed goals of care for [employer] .  Code status:   Code Status: DNR   Disposition: Continue current acute care  Time spent for the meeting: 25 min  Discussed her current medical situation and plans for therapy. Option to continue aggressive treatment short of CPR/Intubation, option for comfort measures though I do think there is still a decent chance at recovery but there is also a chance that she is too weak to recover especially now that she has multiple infections, poor respiratory drive, confusion. Option to place NG tube for nutrition if mentation improves somewhat such that she would not pull a feeding tube. Son states he would be ok with NG tube if needed, plan to continue current aggressive care for now which I believe is reasonable.     Emeterio Reeve, DO  07/01/2022, 3:10 PM

## 2022-07-01 NOTE — Progress Notes (Signed)
       CROSS COVER NOTE  NAME: Samantha Mason MRN: [id number] DOB : 03/23/1951 ATTENDING PHYSICIAN: Emeterio Reeve, DO    Date of Service   07/01/2022   HPI/Events of Note   Request received from nursing staff for safety sitter. Per report Samantha Mason is attempting to remove bipap and anxious. Patient was intubated after receiving valium on 1/25. Will not order sedating meds. On review of chart Samantha Mason has been intermittently refusing bipap since at least 06/22/22.   Interventions   Assessment/Plan:  Safety Sitter X X    *** professional thanks      To reach the provider On-Call:   7AM- 7PM see care teams to locate the attending and reach out to them via [email or web address] Password: [email or web address] 7PM-7AM contact night-coverage If you still have difficulty reaching the appropriate provider, please page the [facility] (Director on Call) for [facility] on amion for assistance  This document was prepared using Systems analyst and may include unintentional dictation errors.  Neomia Glass DNP, MBA, FNP-BC, PMHNP-BC Nurse Practitioner [facility] Pager [phone] //

## 2022-07-01 NOTE — Progress Notes (Signed)
Physical Therapy Treatment Patient Details Name: Samantha Mason MRN: [id number] DOB: 01-02-1951 Today's Date: 07/01/2022   History of Present Illness 72 y/o female with h/o morbid obesity, OSA, HTN, COPD, CHF, CKD IV, GERD, IBS, RLS and recent admission for SBO (medically managed) who is admitted with CHF, sepsis, UTI, AKI and cellulitis. Pt extubated on 1/31.    PT Comments    Pt seen for PT tx with pt agreeable to tx. Pt with poor cognition throughout session (impaired focused & sustained attention, poor initiation, poor recall, poor safety awareness, impaired orientation). Pt requires mod assist for supine>sit with use of hospital bed features, total assist +2 for sit>supine. Pt is able to transfer to standing at EOB 2-3 times but unable to initiate stepping to turn towards recliner nor take steps along EOB. Overall, pt is making gains in regards to functional mobility but is very limited by impaired cognition. Continue to recommend STR upon d/c.    Recommendations for follow up therapy are one component of a multi-disciplinary discharge planning process, led by the attending physician.  Recommendations may be updated based on patient status, additional functional criteria and insurance authorization.  Follow Up Recommendations  Skilled nursing-short term rehab (<3 hours/day) Can patient physically be transported by private vehicle: No   Assistance Recommended at Discharge Frequent or constant Supervision/Assistance  Patient can return home with the following Two people to help with walking and/or transfers;Two people to help with bathing/dressing/bathroom   Equipment Recommendations  None recommended by PT    Recommendations for Other Services       Precautions / Restrictions Precautions Precautions: Fall Restrictions Weight Bearing Restrictions: No     Mobility  Bed Mobility Overal bed mobility: Needs Assistance Bed Mobility: Supine to Sit     Supine to sit: Mod  assist, HOB elevated (max cuing to move BLE towards EOB & shift hips towards EOB, pt able to hold to bed rail & upright self with HOB elevated) Sit to supine: Total assist, +2 for physical assistance, +2 for safety/equipment        Transfers Overall transfer level: Needs assistance Equipment used: Rolling walker (2 wheels) Transfers: Sit to/from Stand Sit to Stand: Min assist           General transfer comment: Pt able to perform STS x 2-3 times during session from slightly elevated EOB with MAX ongoing cuing re: safe hand placement during STS (pt with poor recall & sustained attention). Pt is able to power up to standing with min assist with cuing to transition UE to RW.    Ambulation/Gait                   Stairs             Wheelchair Mobility    Modified Rankin (Stroke Patients Only)       Balance Overall balance assessment: Needs assistance Sitting-balance support: Feet supported Sitting balance-Leahy Scale: Fair Sitting balance - Comments: supervision static sitting EOB, pt participates in scooting R alongside EOB towards HOB with mod<>max assist, pt unaware of need to reposition feet each time after scooting   Standing balance support: Bilateral upper extremity supported, During functional activity, Reliant on assistive device for balance Standing balance-Leahy Scale: Poor Standing balance comment: BUE support on RW, fair<>poor static standing balance, pt unable to initiate taking steps to turn towards recliner or to take steps along EOB to R  Cognition Arousal/Alertness:  (initially lethargic, does improve alertness with verbal cuing) Behavior During Therapy: WFL for tasks assessed/performed Overall Cognitive Status: Impaired/Different from baseline Area of Impairment: Orientation, Attention, Memory, Problem solving, Awareness, Safety/judgement, Following commands                 Orientation Level:  Disoriented to, Place, Time, Situation (oriented to city, able to select hospital from choice of two then provides "[facility]" with cuing) Current Attention Level: Focused Memory: Decreased recall of precautions, Decreased short-term memory Following Commands: Follows one step commands inconsistently, Follows one step commands with increased time Safety/Judgement: Decreased awareness of safety, Decreased awareness of deficits Awareness: Intellectual Problem Solving: Slow processing, Decreased initiation, Difficulty sequencing, Requires verbal cues, Requires tactile cues General Comments: Very poor cognition during session, initially requires MAX cuing to open eyes as pt would state she couldn't see PT but this was because her eyes were closed, very poor sustained attention & ability to follow commands, frequently states "wait, wait" when PT attempts to encourage initiation of mobility tasks        Exercises General Exercises - Lower Extremity Long Arc Quad: AROM, Strengthening, Both, Seated (12 reps)    General Comments General comments (skin integrity, edema, etc.): Pt on 2L/min via nasal cannula with SPO2 >90% throughout.      Pertinent Vitals/Pain Pain Assessment Pain Assessment: Faces Faces Pain Scale: Hurts little more Pain Location: L foot with touch, movement Pain Descriptors / Indicators: Discomfort Pain Intervention(s): Repositioned, Monitored during session    Home Living                          Prior Function            PT Goals (current goals can now be found in the care plan section) Acute Rehab PT Goals Patient Stated Goal: get stronger PT Goal Formulation: With patient Time For Goal Achievement: 07/04/22 Progress towards PT goals: Progressing toward goals    Frequency    Min 2X/week      PT Plan Current plan remains appropriate    Co-evaluation              AM-PAC PT "6 Clicks" Mobility   Outcome Measure  Help needed turning from your  back to your side while in a flat bed without using bedrails?: A Lot Help needed moving from lying on your back to sitting on the side of a flat bed without using bedrails?: Total Help needed moving to and from a bed to a chair (including a wheelchair)?: Total Help needed standing up from a chair using your arms (e.g., wheelchair or bedside chair)?: Total Help needed to walk in hospital room?: Total Help needed climbing 3-5 steps with a railing? : Total 6 Click Score: 7    End of Session Equipment Utilized During Treatment: Oxygen Activity Tolerance: Other (comment) (impaired cognition) Patient left: in bed;with call bell/phone within reach;with bed alarm set;with nursing/sitter in room Nurse Communication: Mobility status PT Visit Diagnosis: Muscle weakness (generalized) (M62.81);Other abnormalities of gait and mobility (R26.89);Difficulty in walking, not elsewhere classified (R26.2)     Time: [id number] PT Time Calculation (min) (ACUTE ONLY): 30 min  Charges:  $Therapeutic Activity: 23-37 mins                     Lavone Nian, PT, DPT 07/01/22, 11:29 AM   Waunita Schooner 07/01/2022, 11:27 AM

## 2022-07-01 NOTE — Plan of Care (Signed)

## 2022-07-02 DIAGNOSIS — L0390 Cellulitis, unspecified: Secondary | ICD-10-CM | POA: Diagnosis not present

## 2022-07-02 DIAGNOSIS — J189 Pneumonia, unspecified organism: Secondary | ICD-10-CM | POA: Diagnosis not present

## 2022-07-02 DIAGNOSIS — A419 Sepsis, unspecified organism: Secondary | ICD-10-CM | POA: Diagnosis not present

## 2022-07-02 LAB — BLOOD CULTURE ID PANEL (REFLEXED) - BCID2

## 2022-07-02 LAB — CBC
HCT: 29.7 % — ABNORMAL LOW (ref 36.0–46.0)
Hemoglobin: 8.4 g/dL — ABNORMAL LOW (ref 12.0–15.0)
MCH: 24.3 pg — ABNORMAL LOW (ref 26.0–34.0)
MCHC: 28.3 g/dL — ABNORMAL LOW (ref 30.0–36.0)
MCV: 85.8 fL (ref 80.0–100.0)
Platelets: 250 10*3/uL (ref 150–400)
RBC: 3.46 MIL/uL — ABNORMAL LOW (ref 3.87–5.11)
RDW: 18.1 % — ABNORMAL HIGH (ref 11.5–15.5)
WBC: 6 10*3/uL (ref 4.0–10.5)
nRBC: 0 % (ref 0.0–0.2)

## 2022-07-02 LAB — URINE CULTURE: Culture: >=100000 — AB

## 2022-07-02 LAB — BLOOD GAS, ARTERIAL
Acid-Base Excess: 5.6 mmol/L — ABNORMAL HIGH (ref 0.0–2.0)
Bicarbonate: 32.7 mmol/L — ABNORMAL HIGH (ref 20.0–28.0)
Delivery systems: POSITIVE
Expiratory PAP: 5 cmH2O
FIO2: 30 %
Inspiratory PAP: 15 cmH2O
O2 Saturation: 99.2 %
Patient temperature: 37
RATE: 12 resp/min
pCO2 arterial: 62 mmHg — ABNORMAL HIGH (ref 32–48)
pH, Arterial: 7.33 — ABNORMAL LOW (ref 7.35–7.45)
pO2, Arterial: 91 mmHg (ref 83–108)

## 2022-07-02 LAB — BRAIN NATRIURETIC PEPTIDE: B Natriuretic Peptide: 154.9 pg/mL — ABNORMAL HIGH (ref 0.0–100.0)

## 2022-07-02 LAB — BASIC METABOLIC PANEL
Anion gap: 13 (ref 5–15)
BUN: 48 mg/dL — ABNORMAL HIGH (ref 8–23)
CO2: 26 mmol/L (ref 22–32)
Calcium: 8.9 mg/dL (ref 8.9–10.3)
Chloride: 105 mmol/L (ref 98–111)
Creatinine, Ser: 2.41 mg/dL — ABNORMAL HIGH (ref 0.44–1.00)
GFR, Estimated: 21 mL/min — ABNORMAL LOW (ref >60)
Glucose, Bld: 81 mg/dL (ref 70–99)
Potassium: 3.6 mmol/L (ref 3.5–5.1)
Sodium: 144 mmol/L (ref 135–145)

## 2022-07-02 LAB — MAGNESIUM: Magnesium: 1.7 mg/dL (ref 1.7–2.4)

## 2022-07-02 LAB — PROCALCITONIN: Procalcitonin: 0.19 ng/mL

## 2022-07-02 IMAGING — US US RENAL
1 series · 14 of 25 positions shown · non-contrast
Comparison: CT abdomen pelvis 08/30/2010

CLINICAL DATA: Acute kidney injury

EXAM:
RENAL / URINARY TRACT ULTRASOUND COMPLETE

[Series 1: us renal · 14 of 32 slices shown]
[im 1/32]
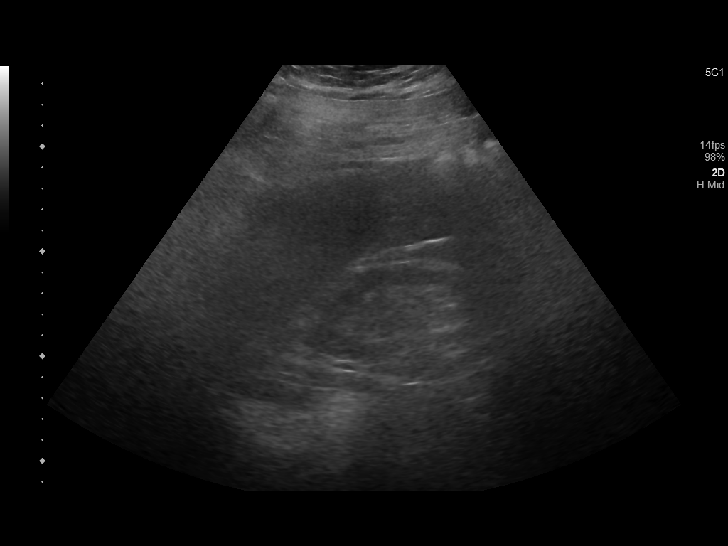
[im 3/32]
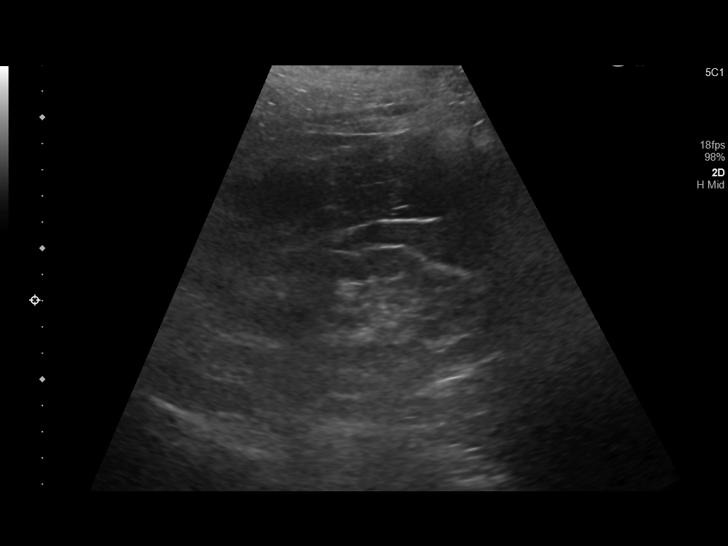
[im 6/32]
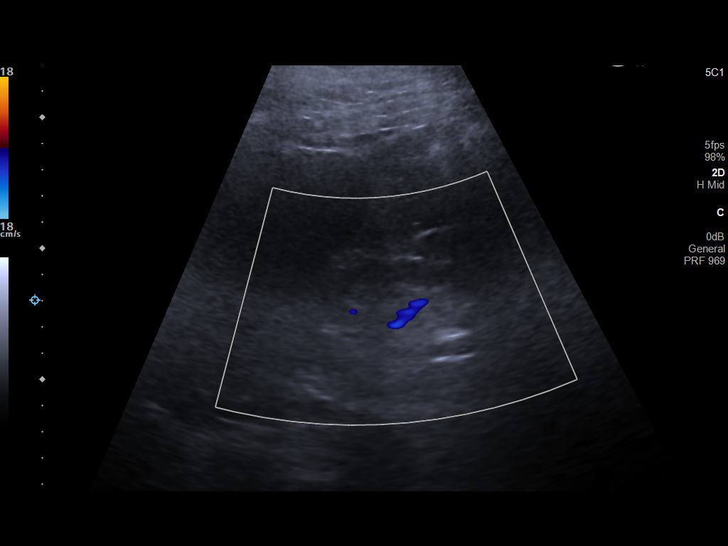
[im 8/32]
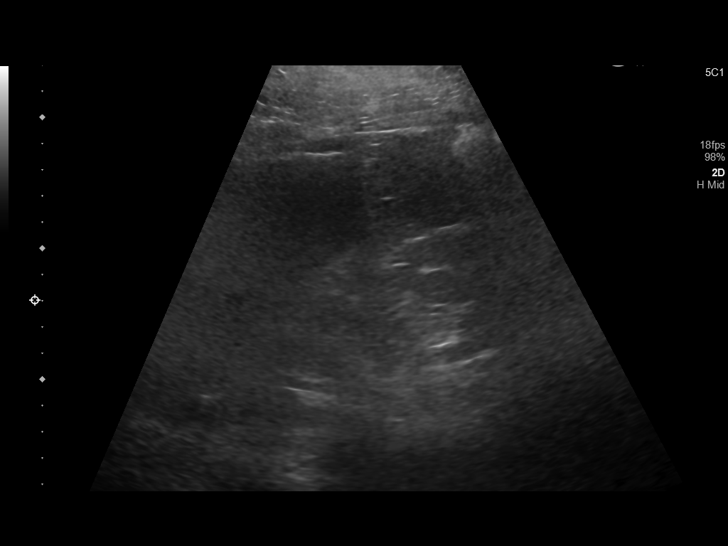
[im 11/32]
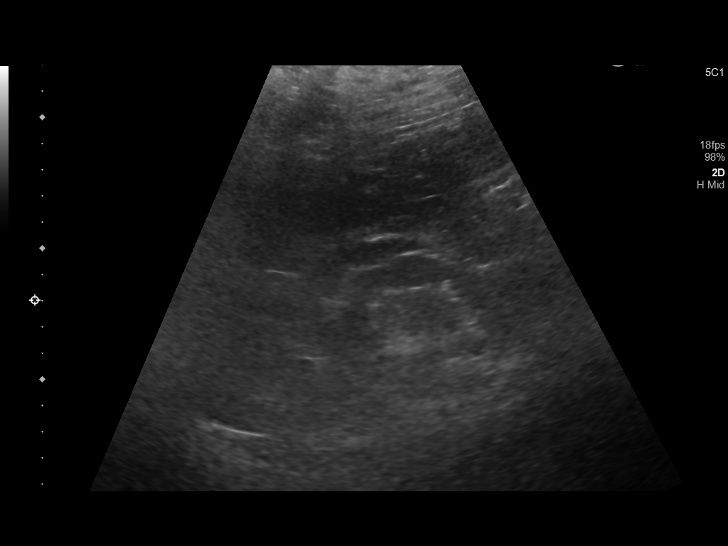
[im 12/32]
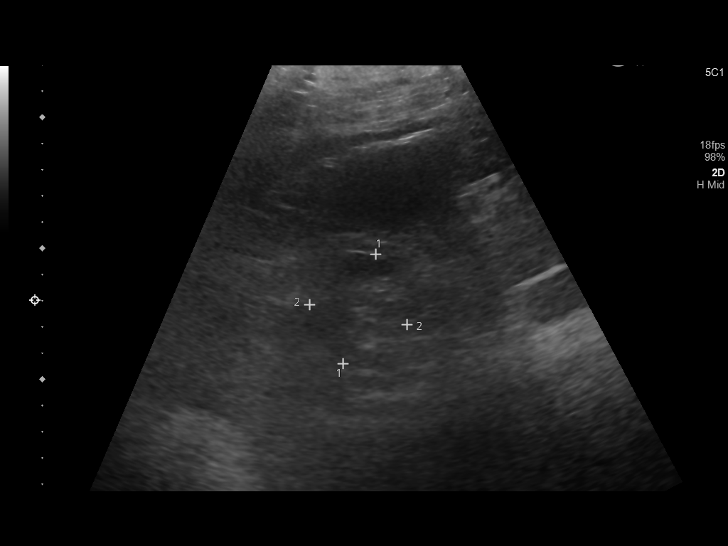
[im 15/32]
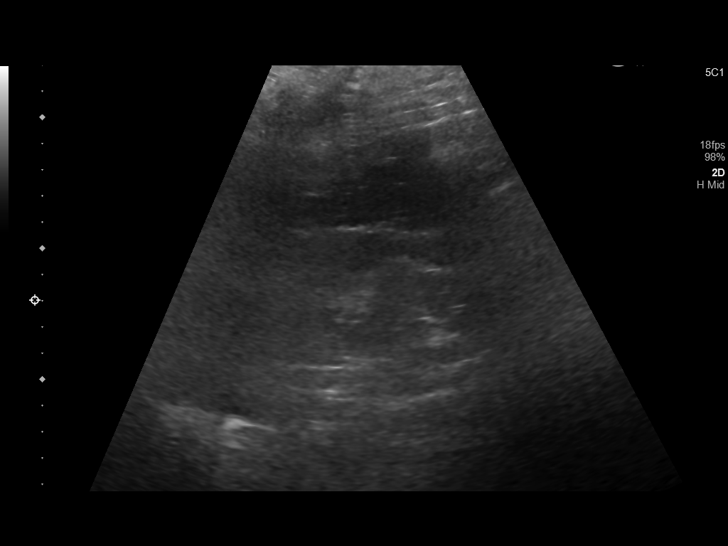
[im 17/32]
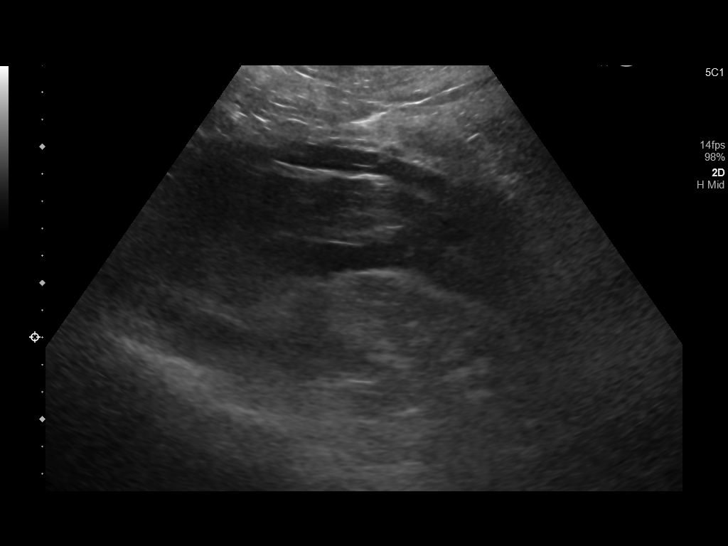
[im 20/32]
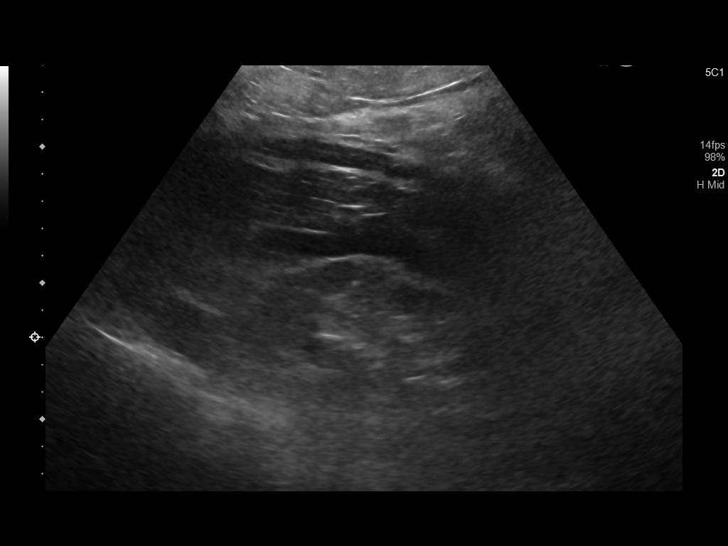
[im 21/32]
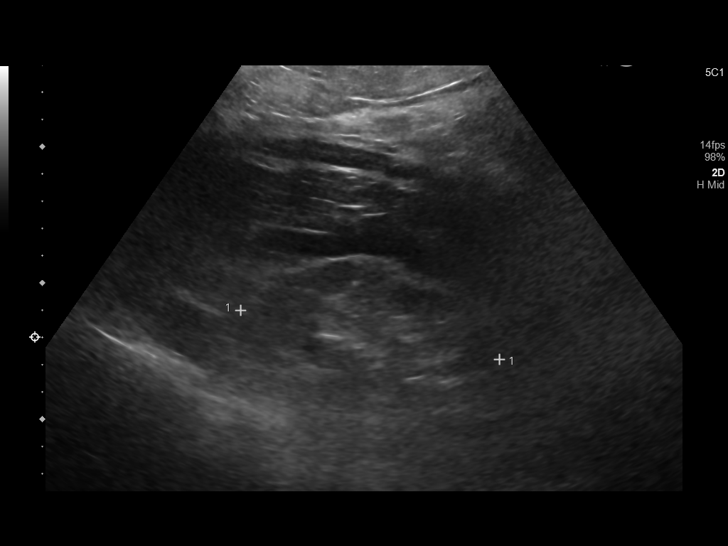
[im 24/32]
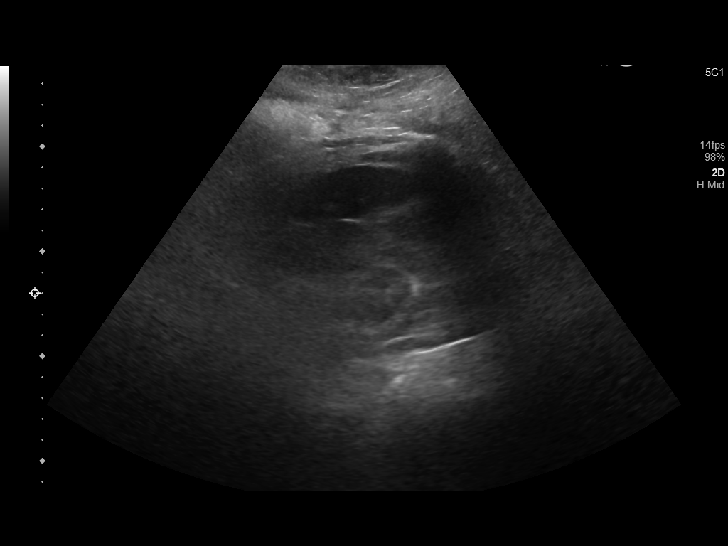
[im 26/32]
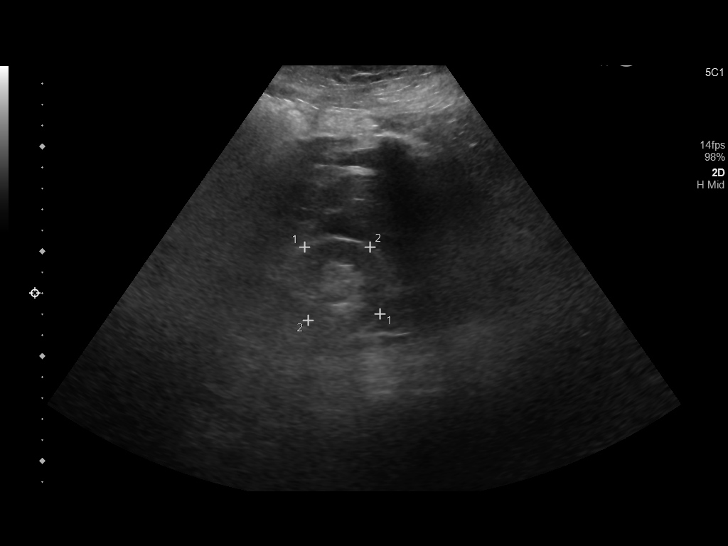
[im 29/32]
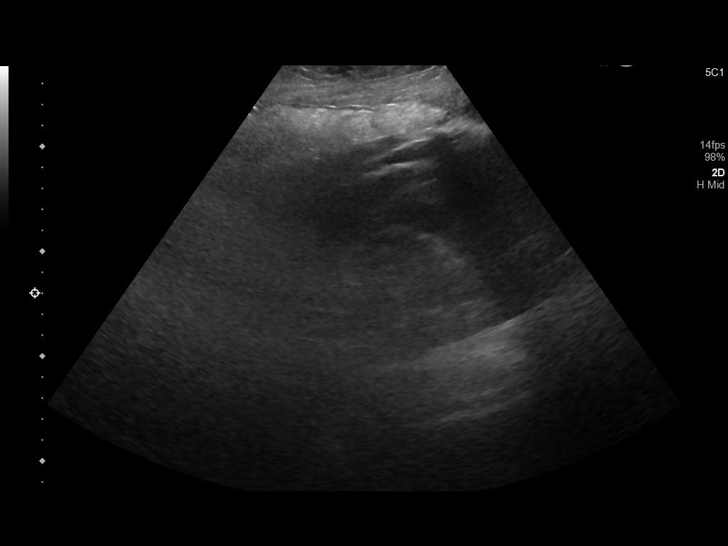
[im 32/32]
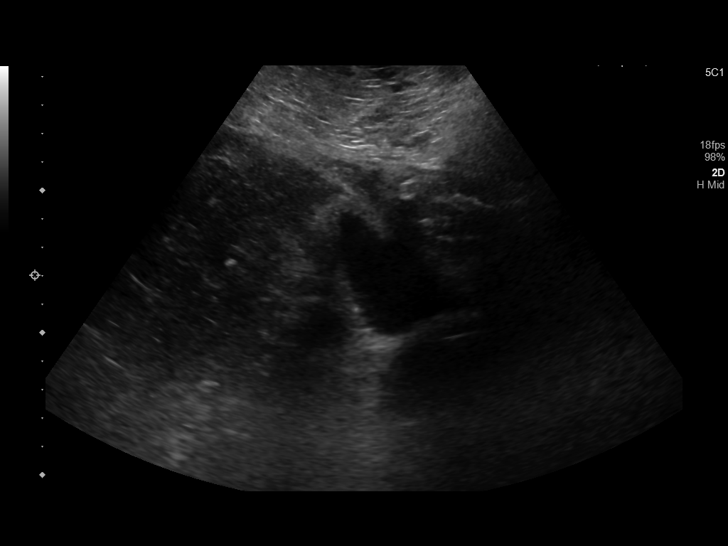

[14 of 25 positions shown; findings below may reference images not displayed]

FINDINGS: Right Kidney:

Renal measurements: 10.3 x 4.4 x 3.8 cm = volume: 89 mL.
Echogenicity within normal limits. No mass or hydronephrosis
visualized. Mild perinephric effusion.

Left Kidney:

Renal measurements: 10.6 x 4.4 x 4.6 cm = volume: 122 mL.
Echogenicity within normal limits. No mass or hydronephrosis
visualized. Mild perinephric effusion

Bladder:

Appears normal for degree of bladder distention.

Other:

Image quality degraded by patient body habitus
IMPRESSION: Relatively small kidneys. No hydronephrosis. Bilateral perinephric
effusions.

## 2022-07-02 IMAGING — DX DG CHEST 1V PORT
1 series · 1 of 1 positions shown · non-contrast
Comparison: 07/17/2021

CLINICAL DATA: Chest pain and COPD

EXAM:
PORTABLE CHEST 1 VIEW

[chest ap]
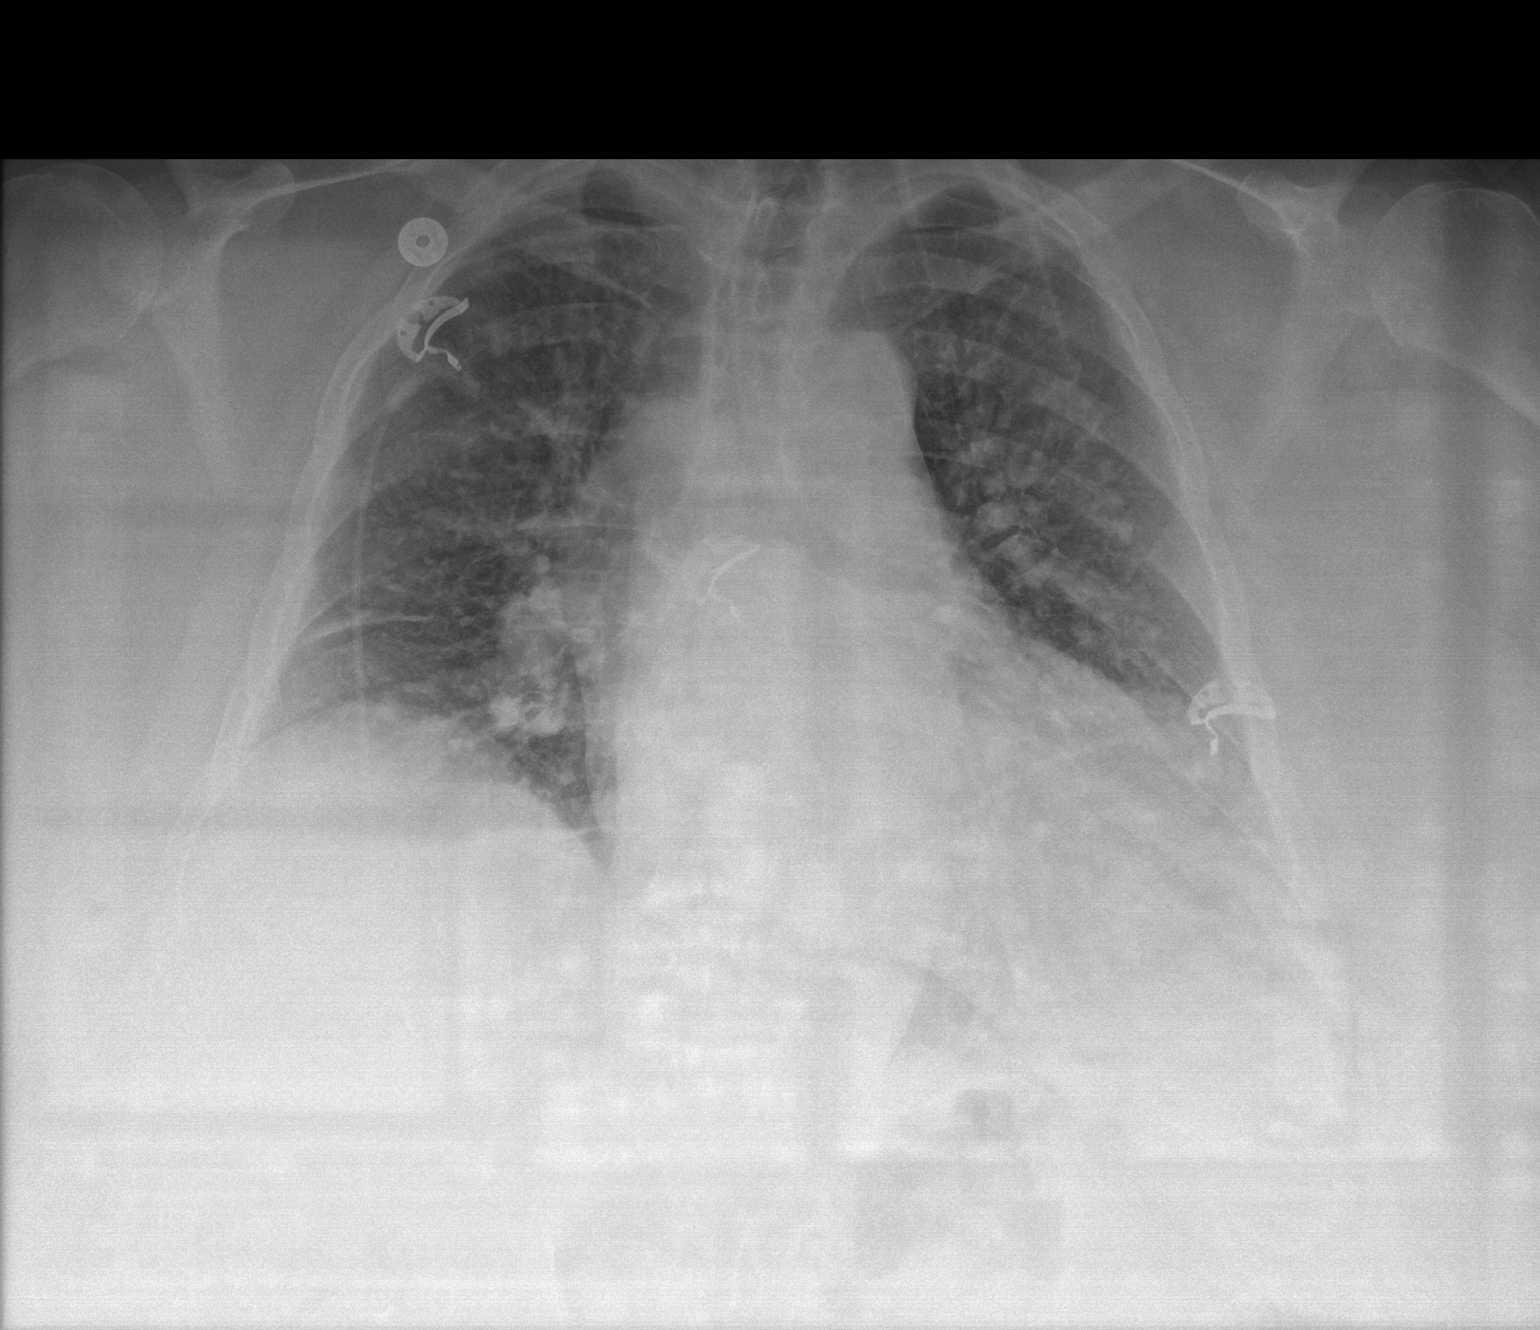

[1 of 1 positions shown; findings below may reference images not displayed]

FINDINGS: Cardiac enlargement with mild vascular congestion. Negative for
edema or effusion. Elevated right hemidiaphragm with bibasilar
atelectasis unchanged.
IMPRESSION: Mild vascular congestion unchanged.

Mild bibasilar atelectasis unchanged from earlier today.

## 2022-07-02 IMAGING — DX DG CHEST 1V PORT
1 series · 1 of 1 positions shown · non-contrast
Comparison: July 15, 2021.

CLINICAL DATA: Chronic respiratory failure with hypoxia.

EXAM:
PORTABLE CHEST 1 VIEW

[chest ap]
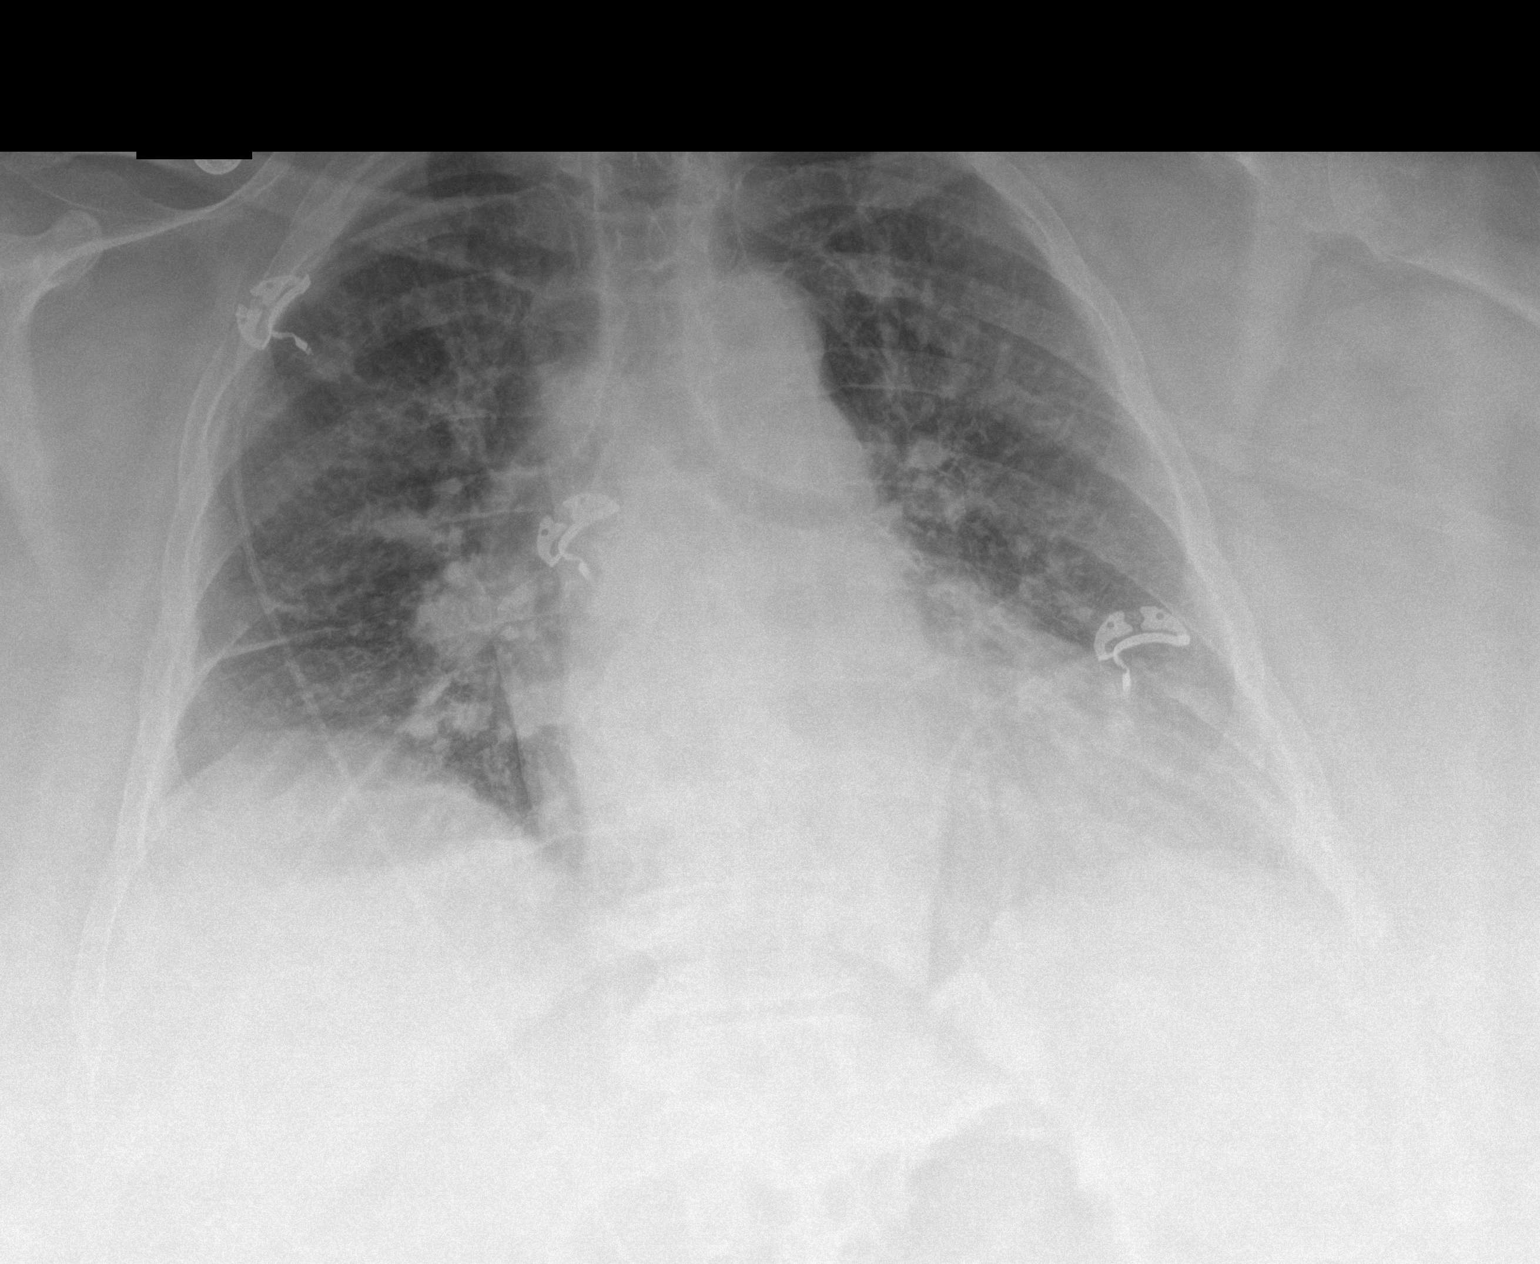

[1 of 1 positions shown; findings below may reference images not displayed]

FINDINGS: Stable cardiomegaly with central pulmonary vascular congestion. No
pneumothorax or pleural effusion is noted. Bony thorax is
unremarkable.
IMPRESSION: Stable cardiomegaly with central pulmonary vascular congestion.

## 2022-07-02 MED ORDER — FLUCONAZOLE IN SODIUM CHLORIDE 200-0.9 MG/100ML-% IV SOLN
200.0000 mg | Freq: Once | INTRAVENOUS | Status: AC
  Administered 2022-07-02: 200 mg via INTRAVENOUS
  Filled 2022-07-02: qty 100

## 2022-07-02 MED ORDER — FLUCONAZOLE IN SODIUM CHLORIDE 200-0.9 MG/100ML-% IV SOLN
200.0000 mg | INTRAVENOUS | Status: AC
  Administered 2022-07-03 – 2022-07-04 (×2): 200 mg via INTRAVENOUS
  Filled 2022-07-02 (×2): qty 100

## 2022-07-02 NOTE — Progress Notes (Addendum)
PROGRESS NOTE    Samantha Mason   [id number] DOB: September 02, 1950  DOA: 06/08/2022 Date of Service: 07/02/22 PCP: Glean Hess, MD     Brief Narrative / Hospital Course:  72 y.o. female with medical history significant for Morbid obesity, chronic HFpEF,CKD stage III, chronic lower extremity lymphedema on treatment with torsemide, hypertension, asthma, stroke (1995) and obstructive sleep apnea (non compliant with cpap) to ED w/ CC fatigue, LE swelling/redness. Pt was admitted. Course complicated by CO2 narcosis, failed BiPAP requiring intubation and mechanical ventilation.  Transferred to hospitalist service on 06/24/22. Treated for Acute on Chronic HFpEF. Her respiratory failure has been treated with BiPAP, diuresis, and bronchodilators. She has been refusing BiPAP now. Currently on 2L [place]. Diuresis held, Cr worsens despite this but has stabilized, nephrology following.   1/20: Presented to ED, admitted by [facility] with Sepsis in setting of lower extremity Cellulitis, AKI, and Acute on Chronic Hypercapnic Respiratory Failure in setting AECOPD.  1/21: Added cefepime, continue vancomycin, increase dose of Cardizem for better heart rate control 1/22: Had to reduce Cardizem as blood pressure dropped 1/23: Transferred to ICU overnight due to CO2 narcosis, placed on BiPAP.  PCCM consulted.  Worsening AKI, holding diuresis.  Agitation requiring Precedex.  Palliative Care consulted for [place].  ABX broadened back to Cefepime and Vancomycin 1/24: Pt on Bipap but awake and able to follow commands. Attempt to transition off Bipap.  1/25: Pt currently on Bipap and precedex gtt.  Required 2.5 mg iv valium overnight for delirium.  Unable to obtain CT Head due to respiratory status.  Ultimately required intubation. 1/26: Remains critically ill. Issues with vent dyssynchrony and ventilation, plan to start Nimbex gtt. Creatinine improved with diuresis, continue with diuresis, Nephrology considering Lasix gtt. 1/27:  ABG's improved on Nimbex, persistent cuff leak, exchanged ETT. Decadron started due to upper airway swelling. Will attempt to d/c Nimbex. Good diuresis with Lasix gtt with 4.5 L of UOP last 24 hrs, Creatinine improving. 1/28: Continues with good diuresis on Lasix gtt  1/29: No acute events overnight. Remained mechanically intubated with minimal vent settings.  Remains on lasix gtt.  Not on sedation and attempting to follow commands but extremely weak.   1/30: No acute events overnight.  Pt awake and following commands on precedex gtt.  Lasix gtt. Tolerating SBT 15/8 1/31: No acute events, awake and following commands on low dose precedex.  Remains on Lasix gtt,  Creatinine remains stable BUN slowly uptrending. Tolerating SBT 15/8 ~ will continue to wean PSV as tolerated ~ EXTUBATED. Lasix gtt d/c following extubation.  Following extubation, pt confirmed DNR/DNI status in presence of her son at bedside. 2/1: Tolerated BiPAP overnight, weaned to [facility] this am.  Speech evaluation performed, which pt FAILED. Consult PT/OT.  Creatinine continues to improve, started on D5W @ 50 ml/hr due to Hypernatremia of 155.  Holding diuresis today 2/2: Pt febrile on Bipap @50$ %.  Pt awake and following commands.  Failed speech evaluation on 02/1 and declining NGT placement.  2/2: Venous US BLE: No DVT 2/3: Agreeable to small bore NGT placement and dialysis if needed.  Again confirmed DNR/DNI  2/4: Nursing staff reporting pt now tolerating ice chips without signs of aspiration. NG was not needed.  2/5: back to hospitalist service. Creatinine uptrending.  2/6-2/09: Cr not improving but has been stable -  nephrology following, pt agreeable to temporary HD if needed, nephro recs continue to monitor UOP. No acute need for dialysis. Continue to hold diuretics. Liquid stool persist,  GI PCR negative, KUB no concerns.   02/10-02/11: Cr starting to improve some.  02/11 overnight insomnia, given Rx and sleep around 04:30, was a bit  more confused on AM rounds likely med effect + hypercarbia as she has been refusing BiPap - found hypercarbic on ABG and placed back on BiPap and transferred to Progressive unit. UA also concerning for UTI, CXR concerning for PNA. (+)Sepsis criteria  02/12: Cr thankfully improving. Still confused. Remains on BiPap. See IPAL note for discussion w/ son.  02/13: 1/4 BCx(+) likely contaminant. Urine growing yeast. BNP and Procal, neither remarkably elevated, will not administer diuresis but will repeat CXR in AM and d/c abx other than diflucan for yeast in urine. Remains hypercarbic and on BiPap, more alert today but still trying to pull off mask and requiring frequent redirection/sitter.    Lab Results  Component Value Date   CREATININE 2.41 (H) 07/02/2022   CREATININE 2.85 (H) 07/01/2022   CREATININE 3.05 (H) 06/30/2022    Consultants:  PCCU Nephrology  Palliative Care   Procedures: EEG 01/26 Art line insertion 01/26 CVC to L IJ 01/25 Intubation 01/25      ASSESSMENT & PLAN:   Principal Problem:   Severe sepsis ([place]) Active Problems:   Acute on chronic respiratory failure with hypoxia and hypercapnia (HCC)   Chronic diastolic CHF (congestive heart failure) (HCC)   Essential hypertension   Acute renal failure superimposed on stage 3b chronic kidney disease (HCC)   Morbid obesity with BMI of 50.0-59.9, adult (HCC)   OSA    History of stroke   Extreme obesity with alveolar hypoventilation (HCC)   Cellulitis   Acute metabolic encephalopathy   Community acquired pneumonia  Toxic Metabolic Encephalopathy likely CO2 narcosis - resolved then recurred Also concern for multifocal pneumonia and UTI - see below Continue BiPap Intermittent/prn ABG  Sepsis/SIRS w/ concern for Multifocal Pneumonia per CXR 02/11 Procalcitonin unremarkable  BNP unremarkable  Cefepime + vancomycin for multifocal pneumonia acquired in hospital setting --> d/c today but low threshold to restart if  febrile or increased SOB Repeat CXR in AM BCx likely contaminant Limit IV fluids d/t hx HFpEF but BNP no concerns, monitor fluid status closely  Sepsis d/t UTI per UA 02/11 UCx (+)yeast  DIflucan per pharmacy  Limit IV fluids d/t hx HFpEF but BNP no concerns, monitor fluid status closely  Acute on Chronic Kidney Injury likely secondary to severe infection/sepsis on admission, was improving then worse again, concern for volume contraction / diuresis effect - improving now Hypernatremia - resolved Contraction Alkalosis - resolved  holding diuretics, holding IVF management per nephrology - there was some concern about possible needing dialysis but she has been stable for past few days  Nephrology following  Follow BMP  Acute on Chronic Hypoxic and Hypercapnic Respiratory Failure D/t Acute on Chronic HFpEF initially, now concern for restrictive lung disease and multifocal pneumonia  supplemental O2 to keep sats >=90%, wean as tolerated BiPap as above palliative care involved DNR/DNI  continue supportive care with bronchodilators  Acute on Chronic HFpEF - improved Holding diuresis given contraction, hypernatremia, and renal failure. Avoid aggressive IV fluids  BNP today unremarkable, monitor as needed    Anemia of Chronic Disease Monitor CBC Aranesp weekly   Hypokalemia monitor and replete PRN       DVT prophylaxis: heparin sq Pertinent IV fluids/nutrition: none Central lines / invasive devices: none  Code Status: DNR  Current Admission Status: inpatient   TOC needs / Dispo plan: SNF -  [facility] accepted patient  Barriers to discharge / significant pending items: renal function was barrier, now pt is again obtuned requiring BiPap continuously, concern for infection as well as above              Subjective / Brief ROS:  Pt unable to meaningfully contribute. She is more alert today and is oriented to place but not to situation  Family Communication:  spoke w/ her son on the phone today 07/02/22 2:15 PM     Objective Findings:  Vitals:   07/02/22 0200 07/02/22 0500 07/02/22 0829 07/02/22 1156  BP:   (!) 162/64 (!) 164/65  Pulse: 95  99 96  Resp: (!) 24  18 18  $ Temp:   97.7 F (36.5 C) 97.7 F (36.5 C)  TempSrc:      SpO2: 97%  99% 100%  Weight:  (!) 143 kg    Height:        Intake/Output Summary (Last 24 hours) at 07/02/2022 1350 Last data filed at 07/02/2022 [id number] Gross per 24 hour  Intake 100 ml  Output 500 ml  Net -400 ml     Filed Weights   06/29/22 0500 06/30/22 0500 07/02/22 0500  Weight: (!) 143.2 kg (!) 146.7 kg (!) 143 kg    Examination:  Physical Exam Constitutional:      General: She is not in acute distress.    Appearance: She is obese.  Cardiovascular:     Rate and Rhythm: Normal rate and regular rhythm.     Heart sounds: Normal heart sounds.  Pulmonary:     Effort: No respiratory distress.     Breath sounds: Normal breath sounds.  Abdominal:     Palpations: Abdomen is soft.  Musculoskeletal:        General: No swelling.     Right lower leg: No edema.     Left lower leg: No edema.  Skin:    General: Skin is warm and dry.  Neurological:     General: No focal deficit present.     Mental Status: She is alert. She is disoriented.          Scheduled Medications:   budesonide (PULMICORT) nebulizer solution  0.5 mg Nebulization BID   darbepoetin (ARANESP) injection - NON-DIALYSIS  60 mcg Subcutaneous Q Tue-1800   feeding supplement  237 mL Oral TID BM   fluocinonide-emollient   Topical QODAY   Gerhardt's butt cream   Topical TID   heparin  5,000 Units Subcutaneous Q8H   melatonin  5 mg Oral QHS   multivitamin with minerals  1 tablet Oral Daily   pantoprazole (PROTONIX) IV  40 mg Intravenous Q24H    Continuous Infusions:  ferric gluconate (FERRLECIT) IVPB Stopped (06/28/22 1646)   fluconazole (DIFLUCAN) IV     [START ON 07/03/2022] fluconazole (DIFLUCAN) IV      PRN Medications:   acetaminophen, diphenoxylate-atropine, docusate sodium, ipratropium-albuterol, lip balm, ondansetron **OR** ondansetron (ZOFRAN) IV, mouth rinse, mouth rinse, phenol  Antimicrobials from admission:  Anti-infectives (From admission, onward)    Start     Dose/Rate Route Frequency Ordered Stop   07/03/22 1000  fluconazole (DIFLUCAN) IVPB 200 mg        200 mg 100 mL/hr over 60 Minutes Intravenous Every 24 hours 07/02/22 1341     07/02/22 1315  fluconazole (DIFLUCAN) IVPB 200 mg        200 mg 100 mL/hr over 60 Minutes Intravenous  Once 07/02/22 1220  07/01/22 1338  vancomycin variable dose per unstable renal function (pharmacist dosing)  Status:  Discontinued         Does not apply See admin instructions 07/01/22 1339 07/02/22 1225   07/01/22 1200  vancomycin (VANCOREADY) IVPB 2000 mg/400 mL        2,000 mg 200 mL/hr over 120 Minutes Intravenous  Once 07/01/22 0948 07/01/22 1500   07/01/22 1045  ceFEPIme (MAXIPIME) 2 g in sodium chloride 0.9 % 100 mL IVPB  Status:  Discontinued        2 g 200 mL/hr over 30 Minutes Intravenous Every 24 hours 07/01/22 0947 07/02/22 1341   06/14/22 1400  ceFEPIme (MAXIPIME) 2 g in sodium chloride 0.9 % 100 mL IVPB        2 g 200 mL/hr over 30 Minutes Intravenous Every 12 hours 06/14/22 1301 06/21/22 0236   06/13/22 1000  ceFEPIme (MAXIPIME) 2 g in sodium chloride 0.9 % 100 mL IVPB  Status:  Discontinued        2 g 200 mL/hr over 30 Minutes Intravenous Every 24 hours 06/12/22 1430 06/13/22 0747   06/13/22 0900  cefTRIAXone (ROCEPHIN) 2 g in sodium chloride 0.9 % 100 mL IVPB  Status:  Discontinued        2 g 200 mL/hr over 30 Minutes Intravenous Every 24 hours 06/13/22 0747 06/14/22 1230   06/11/22 1145  vancomycin (VANCOREADY) IVPB 2000 mg/400 mL        2,000 mg 200 mL/hr over 120 Minutes Intravenous  Once 06/11/22 1047 06/11/22 1344   06/11/22 1145  ceFEPIme (MAXIPIME) 2 g in sodium chloride 0.9 % 100 mL IVPB  Status:  Discontinued        2 g 200 mL/hr  over 30 Minutes Intravenous 2 times daily 06/11/22 1047 06/12/22 1430   06/10/22 2300  vancomycin (VANCOCIN) IVPB 1000 mg/200 mL premix  Status:  Discontinued       See Hyperspace for full Linked Orders Report.   1,000 mg 200 mL/hr over 60 Minutes Intravenous Every 48 hours 06/08/22 2307 06/09/22 0922   06/10/22 2300  vancomycin (VANCOREADY) IVPB 1250 mg/250 mL  Status:  Discontinued       See Hyperspace for full Linked Orders Report.   1,250 mg 166.7 mL/hr over 90 Minutes Intravenous Every 48 hours 06/08/22 2307 06/09/22 0922   06/10/22 1800  cephALEXin (KEFLEX) capsule 500 mg  Status:  Discontinued        500 mg Oral Every 6 hours 06/10/22 1521 06/11/22 1047   06/09/22 2200  vancomycin (VANCOREADY) IVPB 750 mg/150 mL  Status:  Discontinued        750 mg 150 mL/hr over 60 Minutes Intravenous Every 24 hours 06/09/22 0923 06/10/22 1521   06/09/22 1000  ceFEPIme (MAXIPIME) 2 g in sodium chloride 0.9 % 100 mL IVPB  Status:  Discontinued        2 g 200 mL/hr over 30 Minutes Intravenous Every 12 hours 06/09/22 0916 06/10/22 1521   06/08/22 2300  vancomycin (VANCOCIN) IVPB 1000 mg/200 mL premix  Status:  Discontinued        1,000 mg 200 mL/hr over 60 Minutes Intravenous  Once 06/08/22 2257 06/08/22 2307   06/08/22 2215  vancomycin (VANCOCIN) IVPB 1000 mg/200 mL premix       See Hyperspace for full Linked Orders Report.   1,000 mg 200 mL/hr over 60 Minutes Intravenous  Once 06/08/22 2210 06/09/22 0014   06/08/22 2215  vancomycin (VANCOREADY) IVPB 1500 mg/300  mL       See Hyperspace for full Linked Orders Report.   1,500 mg 150 mL/hr over 120 Minutes Intravenous  Once 06/08/22 2210 06/10/22 0807   06/08/22 1915  piperacillin-tazobactam (ZOSYN) IVPB 3.375 g        3.375 g 100 mL/hr over 30 Minutes Intravenous  Once 06/08/22 1909 06/08/22 1959           Data Reviewed:  I have personally reviewed the following...  CBC: Recent Labs  Lab 06/26/22 0434 06/27/22 0303 06/30/22 0424  07/01/22 1047 07/02/22 0531  WBC 7.0 7.4 6.5 6.2 6.0  HGB 7.7* 7.5* 8.7* 9.1* 8.4*  HCT 26.9* 26.3* 30.0* 32.4* 29.7*  MCV 84.1 83.5 84.3 86.4 85.8  PLT 228 199 268 294 [id number]   Basic Metabolic Panel: Recent Labs  Lab 06/28/22 0514 06/29/22 0557 06/30/22 0424 07/01/22 0406 07/02/22 0531  NA 138 140 138 142 144  K 3.3* 3.6 3.6 3.7 3.6  CL 103 103 102 103 105  CO2 26 28 26 29 26  $ GLUCOSE 108* 95 120* 94 81  BUN 68* 63* 58* 53* 48*  CREATININE 3.62* 3.27* 3.05* 2.85* 2.41*  CALCIUM 8.5* 8.8* 9.0 9.0 8.9  MG 2.3 2.4 2.0 2.0 1.7   GFR: Estimated Creatinine Clearance: 30.9 mL/min (A) (by C-G formula based on SCr of 2.41 mg/dL (H)). Liver Function Tests: No results for input(s): "AST", "ALT", "ALKPHOS", "BILITOT", "PROT", "ALBUMIN" in the last 168 hours.  No results for input(s): "LIPASE", "AMYLASE" in the last 168 hours. No results for input(s): "AMMONIA" in the last 168 hours. Coagulation Profile: No results for input(s): "INR", "PROTIME" in the last 168 hours. Cardiac Enzymes: No results for input(s): "CKTOTAL", "CKMB", "CKMBINDEX", "TROPONINI" in the last 168 hours. BNP (last 3 results) No results for input(s): "PROBNP" in the last 8760 hours. HbA1C: No results for input(s): "HGBA1C" in the last 72 hours. CBG: Recent Labs  Lab 07/01/22 0506  GLUCAP 91   Lipid Profile: No results for input(s): "CHOL", "HDL", "LDLCALC", "TRIG", "CHOLHDL", "LDLDIRECT" in the last 72 hours. Thyroid Function Tests: No results for input(s): "TSH", "T4TOTAL", "FREET4", "T3FREE", "THYROIDAB" in the last 72 hours. Anemia Panel: No results for input(s): "VITAMINB12", "FOLATE", "FERRITIN", "TIBC", "IRON", "RETICCTPCT" in the last 72 hours. Most Recent Urinalysis On File:     Component Value Date/Time   COLORURINE YELLOW (A) 06/30/2022 1517   APPEARANCEUR CLOUDY (A) 06/30/2022 1517   APPEARANCEUR Clear 04/27/2012 0958   LABSPEC 1.013 06/30/2022 1517   LABSPEC 1.014 04/27/2012 0958    PHURINE 5.0 06/30/2022 1517   GLUCOSEU NEGATIVE 06/30/2022 1517   GLUCOSEU Negative 04/27/2012 0958   HGBUR MODERATE (A) 06/30/2022 1517   BILIRUBINUR NEGATIVE 06/30/2022 1517   BILIRUBINUR neg 02/19/2021 1040   BILIRUBINUR Negative 04/27/2012 0958   KETONESUR NEGATIVE 06/30/2022 1517   PROTEINUR 30 (A) 06/30/2022 1517   UROBILINOGEN 0.2 02/19/2021 1040   NITRITE NEGATIVE 06/30/2022 1517   LEUKOCYTESUR LARGE (A) 06/30/2022 1517   LEUKOCYTESUR Negative 04/27/2012 0958   Sepsis Labs: @LABRCNTIP$ (procalcitonin:4,lacticidven:4) Microbiology: Recent Results (from the past 240 hour(s))  Gastrointestinal Panel by PCR , Stool     Status: None   Collection Time: 06/27/22  4:34 PM   Specimen: Stool  Result Value Ref Range Status   Campylobacter species NOT DETECTED NOT DETECTED Final   Plesimonas shigelloides NOT DETECTED NOT DETECTED Final   Salmonella species NOT DETECTED NOT DETECTED Final   Yersinia enterocolitica NOT DETECTED NOT DETECTED Final   Vibrio species NOT  DETECTED NOT DETECTED Final   Vibrio cholerae NOT DETECTED NOT DETECTED Final   Enteroaggregative E coli (EAEC) NOT DETECTED NOT DETECTED Final   Enteropathogenic E coli (EPEC) NOT DETECTED NOT DETECTED Final   Enterotoxigenic E coli (ETEC) NOT DETECTED NOT DETECTED Final   Shiga like toxin producing E coli (STEC) NOT DETECTED NOT DETECTED Final   Shigella/Enteroinvasive E coli (EIEC) NOT DETECTED NOT DETECTED Final   Cryptosporidium NOT DETECTED NOT DETECTED Final   Cyclospora cayetanensis NOT DETECTED NOT DETECTED Final   Entamoeba histolytica NOT DETECTED NOT DETECTED Final   Giardia lamblia NOT DETECTED NOT DETECTED Final   Adenovirus F40/41 NOT DETECTED NOT DETECTED Final   Astrovirus NOT DETECTED NOT DETECTED Final   Norovirus GI/GII NOT DETECTED NOT DETECTED Final   Rotavirus A NOT DETECTED NOT DETECTED Final   Sapovirus (I, II, IV, and V) NOT DETECTED NOT DETECTED Final    Comment: Performed at [facility], [address] [place], [place] [address]  Urine Culture (for pregnant, neutropenic or urologic patients or patients with an indwelling urinary catheter)     Status: Abnormal   Collection Time: 07/01/22 10:00 AM   Specimen: Urine, Catheterized  Result Value Ref Range Status   Specimen Description   Final    URINE, CATHETERIZED Performed at [facility], [address], [place], [place] [address]    Special Requests   Final    NONE Performed at [facility], [address] [place], [place] [address]    Culture >=100,000 COLONIES/mL YEAST (A)  Final   Report Status 07/02/2022 FINAL  Final  Culture, blood (Routine X 2) w Reflex to ID Panel     Status: None (Preliminary result)   Collection Time: 07/01/22 10:47 AM   Specimen: BLOOD RIGHT ARM  Result Value Ref Range Status   Specimen Description BLOOD RIGHT ARM  Final   Special Requests   Final    BOTTLES DRAWN AEROBIC AND ANAEROBIC Blood Culture adequate volume   Culture  Setup Time   Final    GRAM POSITIVE COCCI AEROBIC BOTTLE ONLY Organism ID to follow CRITICAL RESULT CALLED TO, READ BACK BY AND VERIFIED WITHPamelia Hoit PHARMD 1130 07/02/22 HNM Performed at [facility], [address], [place], [place] [address]    Culture GRAM POSITIVE COCCI  Final   Report Status PENDING  Incomplete  Culture, blood (Routine X 2) w Reflex to ID Panel     Status: None (Preliminary result)   Collection Time: 07/01/22 10:47 AM   Specimen: BLOOD LEFT ARM  Result Value Ref Range Status   Specimen Description BLOOD LEFT ARM  Final   Special Requests   Final    BOTTLES DRAWN AEROBIC AND ANAEROBIC Blood Culture adequate volume   Culture   Final    NO GROWTH < 24 HOURS Performed at [facility], [address] [place], [place] [address]    Report Status PENDING  Incomplete  Blood Culture ID Panel (Reflexed)     Status: Abnormal   Collection Time: 07/01/22 10:47 AM  Result Value Ref Range Status    Enterococcus faecalis NOT DETECTED NOT DETECTED Final   Enterococcus Faecium NOT DETECTED NOT DETECTED Final   Listeria monocytogenes NOT DETECTED NOT DETECTED Final   Staphylococcus species DETECTED (A) NOT DETECTED Final    Comment: CRITICAL RESULT CALLED TO, READ BACK BY AND VERIFIED WITHPamelia Hoit PHARMD 1130 07/02/22 HNM    Staphylococcus aureus (BCID) NOT DETECTED NOT  DETECTED Final   Staphylococcus epidermidis DETECTED (A) NOT DETECTED Final    Comment: Methicillin (oxacillin) resistant coagulase negative staphylococcus. Possible blood culture contaminant (unless isolated from more than one blood culture draw or clinical case suggests pathogenicity). No antibiotic treatment is indicated for blood  culture contaminants. CRITICAL RESULT CALLED TO, READ BACK BY AND VERIFIED WITH: Pamelia Hoit PHARMD 1130 07/02/22 HNM    Staphylococcus lugdunensis NOT DETECTED NOT DETECTED Final   Streptococcus species NOT DETECTED NOT DETECTED Final   Streptococcus agalactiae NOT DETECTED NOT DETECTED Final   Streptococcus pneumoniae NOT DETECTED NOT DETECTED Final   Streptococcus pyogenes NOT DETECTED NOT DETECTED Final   A.calcoaceticus-baumannii NOT DETECTED NOT DETECTED Final   Bacteroides fragilis NOT DETECTED NOT DETECTED Final   Enterobacterales NOT DETECTED NOT DETECTED Final   Enterobacter cloacae complex NOT DETECTED NOT DETECTED Final   Escherichia coli NOT DETECTED NOT DETECTED Final   Klebsiella aerogenes NOT DETECTED NOT DETECTED Final   Klebsiella oxytoca NOT DETECTED NOT DETECTED Final   Klebsiella pneumoniae NOT DETECTED NOT DETECTED Final   Proteus species NOT DETECTED NOT DETECTED Final   Salmonella species NOT DETECTED NOT DETECTED Final   Serratia marcescens NOT DETECTED NOT DETECTED Final   Haemophilus influenzae NOT DETECTED NOT DETECTED Final   Neisseria meningitidis NOT DETECTED NOT DETECTED Final   Pseudomonas aeruginosa NOT DETECTED NOT DETECTED Final    Stenotrophomonas maltophilia NOT DETECTED NOT DETECTED Final   Candida albicans NOT DETECTED NOT DETECTED Final   Candida auris NOT DETECTED NOT DETECTED Final   Candida glabrata NOT DETECTED NOT DETECTED Final   Candida krusei NOT DETECTED NOT DETECTED Final   Candida parapsilosis NOT DETECTED NOT DETECTED Final   Candida tropicalis NOT DETECTED NOT DETECTED Final   Cryptococcus neoformans/gattii NOT DETECTED NOT DETECTED Final   Methicillin resistance mecA/C DETECTED (A) NOT DETECTED Final    Comment: CRITICAL RESULT CALLED TO, READ BACK BY AND VERIFIED WITHPamelia Hoit PHARMD 1130 07/02/22 HNM Performed at [facility], [address], [place], [place] [address]        Radiology Studies last 3 days: [facility] Chest Port 1 View  Result Date: 06/30/2022 CLINICAL DATA:  Altered mental status, evaluate for sepsis. EXAM: PORTABLE CHEST 1 VIEW COMPARISON:  Chest x-ray June 21, 2022 FINDINGS: The cardiomediastinal silhouette is unchanged in contour. There is patchy heterogeneous opacities in bibasilar lungs. No pleural effusion or pneumothorax. The visualized upper abdomen is unremarkable. No acute osseous abnormality. IMPRESSION: Patchy heterogeneous opacities in bibasilar lungs, raising concern for multifocal infection. Electronically Signed   By: Beryle Flock M.D.   On: 06/30/2022 16:17             LOS: 24 days       Emeterio Reeve, DO [facility] 07/02/2022, 1:50 PM    Dictation software may have been used to generate the above note. Typos may occur and escape review in typed/dictated notes. Please contact Dr Sheppard Coil directly for clarity if needed.  Staff may message me via secure chat in [place]  but this may not receive an immediate response,  please page me for urgent matters!  If 7PM-7AM, please contact night coverage [email or web address]

## 2022-07-02 NOTE — Progress Notes (Signed)
[facility]  ROUNDING NOTE   Subjective:   Samantha Mason  is a 72 year old female with past medical history including COPD with renal failure requiring 2 L oxygen as needed, chronic diastolic heart failure, sleep apnea, morbid obesity, hypertension, and chronic kidney disease stage IV. Patient presents to the emergency department feeling sick for a few days.  She was admitted for Sepsis [facility]) [A41.9] Cellulitis, unspecified cellulitis site [L03.90] Community acquired pneumonia, unspecified laterality [J18.9] Sepsis, due to unspecified organism, unspecified whether acute organ dysfunction present [facility]) [A41.9]  Patient is followed by our office outpatient. She was last seen in office on Apr 17, 2022.   Patient seen laying in bed Remains on Bipap Alert and oriented to self    02/12 0701 - 02/13 0700 In: 100 [IV Piggyback:100] Out: 650 [Urine:650] Lab Results  Component Value Date   CREATININE 2.41 (H) 07/02/2022   CREATININE 2.85 (H) 07/01/2022   CREATININE 3.05 (H) 06/30/2022      Objective:  Vital signs in last 24 hours:  Temp:  [97.7 F (36.5 C)-98.6 F (37 C)] 97.7 F (36.5 C) (02/13 1156) Pulse Rate:  [95-102] 96 (02/13 1156) Resp:  [18-24] 18 (02/13 1156) BP: (133-164)/(49-65) 164/65 (02/13 1156) SpO2:  [97 %-100 %] 100 % (02/13 1156) FiO2 (%):  [30 %] 30 % (02/13 0818) Weight:  [143 kg] 143 kg (02/13 0500)  Weight change:  Filed Weights   06/29/22 0500 06/30/22 0500 07/02/22 0500  Weight: (!) 143.2 kg (!) 146.7 kg (!) 143 kg    Intake/Output: I/O last 3 completed shifts: In: 100 [IV Piggyback:100] Out: 650 [Urine:650]   Intake/Output this shift:  No intake/output data recorded.  Physical Exam: General: NAD  Head: Normocephalic, atraumatic.    Eyes: Anicteric  Lungs:  Clear to auscultation, Normal effort, [place] O2  Heart: Regular rate and rhythm  Abdomen:  Soft, nontender  Extremities: No peripheral edema.  Neurologic: Alert and  oriented  Skin: No lesions  Access: No hemodialysis access    Basic Metabolic Panel: Recent Labs  Lab 06/28/22 0514 06/29/22 0557 06/30/22 0424 07/01/22 0406 07/02/22 0531  NA 138 140 138 142 144  K 3.3* 3.6 3.6 3.7 3.6  CL 103 103 102 103 105  CO2 26 28 26 29 26  $ GLUCOSE 108* 95 120* 94 81  BUN 68* 63* 58* 53* 48*  CREATININE 3.62* 3.27* 3.05* 2.85* 2.41*  CALCIUM 8.5* 8.8* 9.0 9.0 8.9  MG 2.3 2.4 2.0 2.0 1.7     Liver Function Tests: No results for input(s): "AST", "ALT", "ALKPHOS", "BILITOT", "PROT", "ALBUMIN" in the last 168 hours.  No results for input(s): "LIPASE", "AMYLASE" in the last 168 hours. No results for input(s): "AMMONIA" in the last 168 hours.   CBC: Recent Labs  Lab 06/26/22 0434 06/27/22 0303 06/30/22 0424 07/01/22 1047 07/02/22 0531  WBC 7.0 7.4 6.5 6.2 6.0  HGB 7.7* 7.5* 8.7* 9.1* 8.4*  HCT 26.9* 26.3* 30.0* 32.4* 29.7*  MCV 84.1 83.5 84.3 86.4 85.8  PLT 228 199 268 294 250     Cardiac Enzymes: No results for input(s): "CKTOTAL", "CKMB", "CKMBINDEX", "TROPONINI" in the last 168 hours.  BNP: Invalid input(s): "POCBNP"  CBG: Recent Labs  Lab 07/01/22 0506  GLUCAP 91     Microbiology: Results for orders placed or performed during the hospital encounter of 06/08/22  Blood Culture (routine x 2)     Status: None   Collection Time: 06/08/22  7:18 PM   Specimen: BLOOD  Result Value Ref Range Status   Specimen Description BLOOD BLOOD LEFT ARM  Final   Special Requests   Final    BOTTLES DRAWN AEROBIC AND ANAEROBIC Blood Culture adequate volume   Culture   Final    NO GROWTH 5 DAYS Performed at [facility], [address] [place], [place] [address]    Report Status 06/13/2022 FINAL  Final  Blood Culture (routine x 2)     Status: None   Collection Time: 06/08/22  7:18 PM   Specimen: BLOOD  Result Value Ref Range Status   Specimen Description BLOOD BLOOD RIGHT ARM  Final   Special Requests   Final    BOTTLES DRAWN  AEROBIC AND ANAEROBIC Blood Culture adequate volume   Culture   Final    NO GROWTH 5 DAYS Performed at [facility], [address] [place], [place] [address]    Report Status 06/13/2022 FINAL  Final  Resp panel by RT-PCR (RSV, Flu A&B, Covid) Anterior Nasal Swab     Status: None   Collection Time: 06/08/22  7:18 PM   Specimen: Anterior Nasal Swab  Result Value Ref Range Status   SARS Coronavirus 2 by RT PCR NEGATIVE NEGATIVE Final    Comment: (NOTE) SARS-CoV-2 target nucleic acids are NOT DETECTED.  The SARS-CoV-2 RNA is generally detectable in upper respiratory specimens during the acute phase of infection. The lowest concentration of SARS-CoV-2 viral copies this assay can detect is 138 copies/mL. A negative result does not preclude SARS-Cov-2 infection and should not be used as the sole basis for treatment or other patient management decisions. A negative result may occur with  improper specimen collection/handling, submission of specimen other than nasopharyngeal swab, presence of viral mutation(s) within the areas targeted by this assay, and inadequate number of viral copies(<138 copies/mL). A negative result must be combined with clinical observations, patient history, and epidemiological information. The expected result is Negative.  Fact Sheet for Patients:  EntrepreneurPulse.com.au  Fact Sheet for Healthcare Providers:  IncredibleEmployment.be  This test is no t yet approved or cleared by the [place] FDA and  has been authorized for detection and/or diagnosis of SARS-CoV-2 by FDA under an Emergency Use Authorization (EUA). This EUA will remain  in effect (meaning this test can be used) for the duration of the COVID-19 declaration under Section 564(b)(1) of the Act, 21 U.S.C.section 360bbb-3(b)(1), unless the authorization is terminated  or revoked sooner.       Influenza A by PCR NEGATIVE NEGATIVE Final    Influenza B by PCR NEGATIVE NEGATIVE Final    Comment: (NOTE) The Xpert Xpress SARS-CoV-2/FLU/RSV plus assay is intended as an aid in the diagnosis of influenza from Nasopharyngeal swab specimens and should not be used as a sole basis for treatment. Nasal washings and aspirates are unacceptable for Xpert Xpress SARS-CoV-2/FLU/RSV testing.  Fact Sheet for Patients: EntrepreneurPulse.com.au  Fact Sheet for Healthcare Providers: IncredibleEmployment.be  This test is not yet approved or cleared by the [place] FDA and has been authorized for detection and/or diagnosis of SARS-CoV-2 by FDA under an Emergency Use Authorization (EUA). This EUA will remain in effect (meaning this test can be used) for the duration of the COVID-19 declaration under Section 564(b)(1) of the Act, 21 U.S.C. section 360bbb-3(b)(1), unless the authorization is terminated or revoked.     Resp Syncytial Virus by PCR NEGATIVE NEGATIVE Final    Comment: (NOTE) Fact Sheet for Patients: EntrepreneurPulse.com.au  Fact Sheet for Healthcare Providers: IncredibleEmployment.be  This test is not yet approved or cleared by the [place] and has been authorized for detection and/or diagnosis of SARS-CoV-2 by FDA under an Emergency Use Authorization (EUA). This EUA will remain in effect (meaning this test can be used) for the duration of the COVID-19 declaration under Section 564(b)(1) of the Act, 21 U.S.C. section 360bbb-3(b)(1), unless the authorization is terminated or revoked.  Performed at [facility], [address], [place], [place] [address]   Urine Culture     Status: None   Collection Time: 06/08/22  9:34 PM   Specimen: In/Out Cath Urine  Result Value Ref Range Status   Specimen Description   Final    IN/OUT CATH URINE Performed at [facility], [address], [place], [place] [address]    Special  Requests   Final    NONE Performed at [facility], [address], [place], [place] [address]    Culture   Final    NO GROWTH Performed at [facility], [place] [address], [place], [place] [address]    Report Status 06/10/2022 FINAL  Final  MRSA Next Gen by PCR, Nasal     Status: None   Collection Time: 06/10/22 11:49 PM   Specimen: Nasal Mucosa; Nasal Swab  Result Value Ref Range Status   MRSA by PCR Next Gen NOT DETECTED NOT DETECTED Final    Comment: (NOTE) The GeneXpert MRSA Assay (FDA approved for NASAL specimens only), is one component of a comprehensive MRSA colonization surveillance program. It is not intended to diagnose MRSA infection nor to guide or monitor treatment for MRSA infections. Test performance is not FDA approved in patients less than 72 years old. Performed at [facility], [place], [place] [address]   Respiratory (~20 pathogens) panel by PCR     Status: None   Collection Time: 06/12/22  6:15 AM   Specimen: Nasopharyngeal Swab; Respiratory  Result Value Ref Range Status   Adenovirus NOT DETECTED NOT DETECTED Final   Coronavirus 229E NOT DETECTED NOT DETECTED Final    Comment: (NOTE) The Coronavirus on the Respiratory Panel, DOES NOT test for the novel  Coronavirus (2019 nCoV)    Coronavirus HKU1 NOT DETECTED NOT DETECTED Final   Coronavirus NL63 NOT DETECTED NOT DETECTED Final   Coronavirus OC43 NOT DETECTED NOT DETECTED Final   Metapneumovirus NOT DETECTED NOT DETECTED Final   Rhinovirus / Enterovirus NOT DETECTED NOT DETECTED Final   Influenza A NOT DETECTED NOT DETECTED Final   Influenza B NOT DETECTED NOT DETECTED Final   Parainfluenza Virus 1 NOT DETECTED NOT DETECTED Final   Parainfluenza Virus 2 NOT DETECTED NOT DETECTED Final   Parainfluenza Virus 3 NOT DETECTED NOT DETECTED Final   Parainfluenza Virus 4 NOT DETECTED NOT DETECTED Final   Respiratory Syncytial Virus NOT DETECTED NOT DETECTED Final    Bordetella pertussis NOT DETECTED NOT DETECTED Final   Bordetella Parapertussis NOT DETECTED NOT DETECTED Final   Chlamydophila pneumoniae NOT DETECTED NOT DETECTED Final   Mycoplasma pneumoniae NOT DETECTED NOT DETECTED Final    Comment: Performed at [facility], [place]. [address] [place],  [address]  Culture, Respiratory w Gram Stain     Status: None   Collection Time: 06/17/22  8:15 AM   Specimen: Tracheal Aspirate; Respiratory  Result Value Ref Range Status   Specimen Description   Final    TRACHEAL ASPIRATE Performed at [facility], [place],  [place] [address]    Special Requests   Final    NONE Performed at [facility], [place], [place] [address]    Gram Stain   Final    RARE WBC PRESENT, PREDOMINANTLY PMN RARE SQUAMOUS EPITHELIAL CELLS PRESENT FEW YEAST Performed at [facility], [place] [address] [place], [place] [address]    Culture FEW CANDIDA ALBICANS  Final   Report Status 06/19/2022 FINAL  Final  Gastrointestinal Panel by PCR , Stool     Status: None   Collection Time: 06/27/22  4:34 PM   Specimen: Stool  Result Value Ref Range Status   Campylobacter species NOT DETECTED NOT DETECTED Final   Plesimonas shigelloides NOT DETECTED NOT DETECTED Final   Salmonella species NOT DETECTED NOT DETECTED Final   Yersinia enterocolitica NOT DETECTED NOT DETECTED Final   Vibrio species NOT DETECTED NOT DETECTED Final   Vibrio cholerae NOT DETECTED NOT DETECTED Final   Enteroaggregative E coli (EAEC) NOT DETECTED NOT DETECTED Final   Enteropathogenic E coli (EPEC) NOT DETECTED NOT DETECTED Final   Enterotoxigenic E coli (ETEC) NOT DETECTED NOT DETECTED Final   Shiga like toxin producing E coli (STEC) NOT DETECTED NOT DETECTED Final   Shigella/Enteroinvasive E coli (EIEC) NOT DETECTED NOT DETECTED Final   Cryptosporidium NOT DETECTED NOT DETECTED Final   Cyclospora cayetanensis NOT DETECTED NOT DETECTED Final    Entamoeba histolytica NOT DETECTED NOT DETECTED Final   Giardia lamblia NOT DETECTED NOT DETECTED Final   Adenovirus F40/41 NOT DETECTED NOT DETECTED Final   Astrovirus NOT DETECTED NOT DETECTED Final   Norovirus GI/GII NOT DETECTED NOT DETECTED Final   Rotavirus A NOT DETECTED NOT DETECTED Final   Sapovirus (I, II, IV, and V) NOT DETECTED NOT DETECTED Final    Comment: Performed at [facility], [address] [place], [place] [address]  Urine Culture (for pregnant, neutropenic or urologic patients or patients with an indwelling urinary catheter)     Status: Abnormal   Collection Time: 07/01/22 10:00 AM   Specimen: Urine, Catheterized  Result Value Ref Range Status   Specimen Description   Final    URINE, CATHETERIZED Performed at [facility], [address] [place], [place] [address]    Special Requests   Final    NONE Performed at [facility], [address] [place], [place] [address]    Culture >=100,000 COLONIES/mL YEAST (A)  Final   Report Status 07/02/2022 FINAL  Final  Culture, blood (Routine X 2) w Reflex to ID Panel     Status: None (Preliminary result)   Collection Time: 07/01/22 10:47 AM   Specimen: BLOOD RIGHT ARM  Result Value Ref Range Status   Specimen Description BLOOD RIGHT ARM  Final   Special Requests   Final    BOTTLES DRAWN AEROBIC AND ANAEROBIC Blood Culture adequate volume   Culture  Setup Time   Final    GRAM POSITIVE COCCI AEROBIC BOTTLE ONLY Organism ID to follow CRITICAL RESULT CALLED TO, READ BACK BY AND VERIFIED WITHPamelia Hoit PHARMD 1130 07/02/22 HNM Performed at [facility], [address] [place], [place] [address]    Culture GRAM POSITIVE COCCI  Final   Report Status PENDING  Incomplete  Culture, blood (Routine X 2) w Reflex to ID Panel     Status: None (Preliminary result)   Collection Time: 07/01/22 10:47 AM   Specimen: BLOOD LEFT ARM  Result Value Ref Range Status  Specimen  Description BLOOD LEFT ARM  Final   Special Requests   Final    BOTTLES DRAWN AEROBIC AND ANAEROBIC Blood Culture adequate volume   Culture   Final    NO GROWTH < 24 HOURS Performed at [facility], [address] [place], [place] [address]    Report Status PENDING  Incomplete  Blood Culture ID Panel (Reflexed)     Status: Abnormal   Collection Time: 07/01/22 10:47 AM  Result Value Ref Range Status   Enterococcus faecalis NOT DETECTED NOT DETECTED Final   Enterococcus Faecium NOT DETECTED NOT DETECTED Final   Listeria monocytogenes NOT DETECTED NOT DETECTED Final   Staphylococcus species DETECTED (A) NOT DETECTED Final    Comment: CRITICAL RESULT CALLED TO, READ BACK BY AND VERIFIED WITH: Pamelia Hoit PHARMD 1130 07/02/22 HNM    Staphylococcus aureus (BCID) NOT DETECTED NOT DETECTED Final   Staphylococcus epidermidis DETECTED (A) NOT DETECTED Final    Comment: Methicillin (oxacillin) resistant coagulase negative staphylococcus. Possible blood culture contaminant (unless isolated from more than one blood culture draw or clinical case suggests pathogenicity). No antibiotic treatment is indicated for blood  culture contaminants. CRITICAL RESULT CALLED TO, READ BACK BY AND VERIFIED WITH: Pamelia Hoit PHARMD 1130 07/02/22 HNM    Staphylococcus lugdunensis NOT DETECTED NOT DETECTED Final   Streptococcus species NOT DETECTED NOT DETECTED Final   Streptococcus agalactiae NOT DETECTED NOT DETECTED Final   Streptococcus pneumoniae NOT DETECTED NOT DETECTED Final   Streptococcus pyogenes NOT DETECTED NOT DETECTED Final   A.calcoaceticus-baumannii NOT DETECTED NOT DETECTED Final   Bacteroides fragilis NOT DETECTED NOT DETECTED Final   Enterobacterales NOT DETECTED NOT DETECTED Final   Enterobacter cloacae complex NOT DETECTED NOT DETECTED Final   Escherichia coli NOT DETECTED NOT DETECTED Final   Klebsiella aerogenes NOT DETECTED NOT DETECTED Final   Klebsiella oxytoca NOT DETECTED  NOT DETECTED Final   Klebsiella pneumoniae NOT DETECTED NOT DETECTED Final   Proteus species NOT DETECTED NOT DETECTED Final   Salmonella species NOT DETECTED NOT DETECTED Final   Serratia marcescens NOT DETECTED NOT DETECTED Final   Haemophilus influenzae NOT DETECTED NOT DETECTED Final   Neisseria meningitidis NOT DETECTED NOT DETECTED Final   Pseudomonas aeruginosa NOT DETECTED NOT DETECTED Final   Stenotrophomonas maltophilia NOT DETECTED NOT DETECTED Final   Candida albicans NOT DETECTED NOT DETECTED Final   Candida auris NOT DETECTED NOT DETECTED Final   Candida glabrata NOT DETECTED NOT DETECTED Final   Candida krusei NOT DETECTED NOT DETECTED Final   Candida parapsilosis NOT DETECTED NOT DETECTED Final   Candida tropicalis NOT DETECTED NOT DETECTED Final   Cryptococcus neoformans/gattii NOT DETECTED NOT DETECTED Final   Methicillin resistance mecA/C DETECTED (A) NOT DETECTED Final    Comment: CRITICAL RESULT CALLED TO, READ BACK BY AND VERIFIED WITHPamelia Hoit PHARMD 1130 07/02/22 HNM Performed at [facility], [address] [place], [place] [address]     Coagulation Studies: No results for input(s): "LABPROT", "INR" in the last 72 hours.  Urinalysis: Recent Labs    06/30/22 1517  COLORURINE YELLOW*  LABSPEC 1.013  PHURINE 5.0  GLUCOSEU NEGATIVE  HGBUR MODERATE*  BILIRUBINUR NEGATIVE  KETONESUR NEGATIVE  PROTEINUR 30*  NITRITE NEGATIVE  LEUKOCYTESUR LARGE*       Imaging: DG Chest Port 1 View  Result Date: 06/30/2022 CLINICAL DATA:  Altered mental status, evaluate for sepsis. EXAM: PORTABLE CHEST 1 VIEW COMPARISON:  Chest x-ray June 21, 2022 FINDINGS: The cardiomediastinal silhouette is  unchanged in contour. There is patchy heterogeneous opacities in bibasilar lungs. No pleural effusion or pneumothorax. The visualized upper abdomen is unremarkable. No acute osseous abnormality. IMPRESSION: Patchy heterogeneous opacities in bibasilar lungs,  raising concern for multifocal infection. Electronically Signed   By: Beryle Flock M.D.   On: 06/30/2022 16:17     Medications:    ceFEPime (MAXIPIME) IV 2 g (07/02/22 1209)   ferric gluconate (FERRLECIT) IVPB Stopped (06/28/22 1646)     budesonide (PULMICORT) nebulizer solution  0.5 mg Nebulization BID   darbepoetin (ARANESP) injection - NON-DIALYSIS  60 mcg Subcutaneous Q Tue-1800   feeding supplement  237 mL Oral TID BM   fluocinonide-emollient   Topical QODAY   Gerhardt's butt cream   Topical TID   heparin  5,000 Units Subcutaneous Q8H   melatonin  5 mg Oral QHS   multivitamin with minerals  1 tablet Oral Daily   pantoprazole (PROTONIX) IV  40 mg Intravenous Q24H   vancomycin variable dose per unstable renal function (pharmacist dosing)   Does not apply See admin instructions   acetaminophen, diphenoxylate-atropine, docusate sodium, ipratropium-albuterol, lip balm, ondansetron **OR** ondansetron (ZOFRAN) IV, mouth rinse, mouth rinse, phenol  Assessment/ Plan:  Samantha Mason is a 72 y.o.  female  with past medical history including COPD with renal failure requiring 2 L oxygen as needed, chronic diastolic heart failure, sleep apnea, morbid obesity, hypertension, and chronic kidney disease stage IV. Patient presents to the emergency department feeling sick and was admitted for Sepsis [facility]) [A41.9] Cellulitis, unspecified cellulitis site [L03.90] Community acquired pneumonia, unspecified laterality [J18.9] Sepsis, due to unspecified organism, unspecified whether acute organ dysfunction present [facility]) [A41.9]   Acute Kidney Injury on chronic kidney disease stage IIIb with baseline creatinine 1.55 and GFR of 36 on 04/17/22.  Acute kidney injury secondary to severe infection. No IV contrast exposure.  Diuretics remain held. Lower extremity edema present, however patient has history of lymphedema.   Creatinine continues to improve, urine output 650 mL recorded in previous 24  hours with 1 missed occurrence.  No acute need for dialysis at this time.  Continuing to monitor renal function daily.   Lab Results  Component Value Date   CREATININE 2.41 (H) 07/02/2022   CREATININE 2.85 (H) 07/01/2022   CREATININE 3.05 (H) 06/30/2022    Intake/Output Summary (Last 24 hours) at 07/02/2022 1211 Last data filed at 07/02/2022 0659 Gross per 24 hour  Intake 100 ml  Output 650 ml  Net -550 ml    2. Anemia of chronic kidney disease Lab Results  Component Value Date   HGB 8.4 (L) 07/02/2022  Hemoglobin remains below desired target.  Will continue weekly Aranesp.   3.  Chronic diastolic heart failure.  Echo from 07/16/21 shows EF 55 to 60% with mild LVH.  Diuretics held.  4.  Acute respiratory failure.  Currently on BiPAP   LOS: 24 Belle 2/13/202412:11 PM

## 2022-07-02 NOTE — Plan of Care (Signed)

## 2022-07-02 NOTE — Progress Notes (Signed)
PT Cancellation Note  Patient Details Name: Samantha Mason MRN: [id number] DOB: 03-Apr-1951   Cancelled Treatment:     PT attempt. Pt currently on bi-pap with RT at bedside and 1 on 1 sitter in place. PT will return at a more appropriate time when pt is able to participate.    Willette Pa 07/02/2022, 10:35 AM

## 2022-07-02 NOTE — Progress Notes (Signed)
PHARMACY - PHYSICIAN COMMUNICATION CRITICAL VALUE ALERT - BLOOD CULTURE IDENTIFICATION (BCID)  Samantha Mason is an 72 y.o. female who presented to [facility] on 06/08/2022 with a chief complaint of fatigue.  Blood cultures checked for altered mental status  Assessment:  blood cultures from 2/12 with PC in 1 of 4 bottles, BCID detected MRSE.  No suspected source.   Name of physician (or Provider) Contacted: Dr Sheppard Coil  Current antibiotics: Vancomycin and cefepime  Changes to prescribed antibiotics recommended:  Recommendations accepted by provider Stopped vancomycin d/t AKI and blood cx likely representing contaminant.    Results for orders placed or performed during the hospital encounter of 06/08/22  Blood Culture ID Panel (Reflexed) (Collected: 07/01/2022 10:47 AM)  Result Value Ref Range   Enterococcus faecalis NOT DETECTED NOT DETECTED   Enterococcus Faecium NOT DETECTED NOT DETECTED   Listeria monocytogenes NOT DETECTED NOT DETECTED   Staphylococcus species DETECTED (A) NOT DETECTED   Staphylococcus aureus (BCID) NOT DETECTED NOT DETECTED   Staphylococcus epidermidis DETECTED (A) NOT DETECTED   Staphylococcus lugdunensis NOT DETECTED NOT DETECTED   Streptococcus species NOT DETECTED NOT DETECTED   Streptococcus agalactiae NOT DETECTED NOT DETECTED   Streptococcus pneumoniae NOT DETECTED NOT DETECTED   Streptococcus pyogenes NOT DETECTED NOT DETECTED   A.calcoaceticus-baumannii NOT DETECTED NOT DETECTED   Bacteroides fragilis NOT DETECTED NOT DETECTED   Enterobacterales NOT DETECTED NOT DETECTED   Enterobacter cloacae complex NOT DETECTED NOT DETECTED   Escherichia coli NOT DETECTED NOT DETECTED   Klebsiella aerogenes NOT DETECTED NOT DETECTED   Klebsiella oxytoca NOT DETECTED NOT DETECTED   Klebsiella pneumoniae NOT DETECTED NOT DETECTED   Proteus species NOT DETECTED NOT DETECTED   Salmonella species NOT DETECTED NOT DETECTED   Serratia marcescens NOT DETECTED NOT  DETECTED   Haemophilus influenzae NOT DETECTED NOT DETECTED   Neisseria meningitidis NOT DETECTED NOT DETECTED   Pseudomonas aeruginosa NOT DETECTED NOT DETECTED   Stenotrophomonas maltophilia NOT DETECTED NOT DETECTED   Candida albicans NOT DETECTED NOT DETECTED   Candida auris NOT DETECTED NOT DETECTED   Candida glabrata NOT DETECTED NOT DETECTED   Candida krusei NOT DETECTED NOT DETECTED   Candida parapsilosis NOT DETECTED NOT DETECTED   Candida tropicalis NOT DETECTED NOT DETECTED   Cryptococcus neoformans/gattii NOT DETECTED NOT DETECTED   Methicillin resistance mecA/C DETECTED (A) NOT DETECTED    Doreene Eland, PharmD, BCPS, BCIDP Work Cell: [phone] 07/02/2022 12:33 PM

## 2022-07-03 ENCOUNTER — Inpatient Hospital Stay: Payer: Medicare Other

## 2022-07-03 DIAGNOSIS — A419 Sepsis, unspecified organism: Secondary | ICD-10-CM | POA: Diagnosis not present

## 2022-07-03 DIAGNOSIS — R6520 Severe sepsis without septic shock: Secondary | ICD-10-CM | POA: Diagnosis not present

## 2022-07-03 LAB — BLOOD GAS, ARTERIAL
Acid-Base Excess: 5.4 mmol/L — ABNORMAL HIGH (ref 0.0–2.0)
Bicarbonate: 29.9 mmol/L — ABNORMAL HIGH (ref 20.0–28.0)
O2 Saturation: 97.7 %
Patient temperature: 37
pCO2 arterial: 42 mmHg (ref 32–48)
pH, Arterial: 7.46 — ABNORMAL HIGH (ref 7.35–7.45)
pO2, Arterial: 81 mmHg — ABNORMAL LOW (ref 83–108)

## 2022-07-03 LAB — BASIC METABOLIC PANEL
Anion gap: 10 (ref 5–15)
BUN: 45 mg/dL — ABNORMAL HIGH (ref 8–23)
CO2: 30 mmol/L (ref 22–32)
Calcium: 9.1 mg/dL (ref 8.9–10.3)
Chloride: 107 mmol/L (ref 98–111)
Creatinine, Ser: 1.9 mg/dL — ABNORMAL HIGH (ref 0.44–1.00)
GFR, Estimated: 28 mL/min — ABNORMAL LOW (ref >60)
Glucose, Bld: 92 mg/dL (ref 70–99)
Potassium: 3.7 mmol/L (ref 3.5–5.1)
Sodium: 147 mmol/L — ABNORMAL HIGH (ref 135–145)

## 2022-07-03 LAB — CBC
HCT: 31.8 % — ABNORMAL LOW (ref 36.0–46.0)
Hemoglobin: 9.1 g/dL — ABNORMAL LOW (ref 12.0–15.0)
MCH: 24.5 pg — ABNORMAL LOW (ref 26.0–34.0)
MCHC: 28.6 g/dL — ABNORMAL LOW (ref 30.0–36.0)
MCV: 85.7 fL (ref 80.0–100.0)
Platelets: 261 10*3/uL (ref 150–400)
RBC: 3.71 MIL/uL — ABNORMAL LOW (ref 3.87–5.11)
RDW: 18.4 % — ABNORMAL HIGH (ref 11.5–15.5)
WBC: 6.7 10*3/uL (ref 4.0–10.5)
nRBC: 0 % (ref 0.0–0.2)

## 2022-07-03 LAB — MAGNESIUM: Magnesium: 1.6 mg/dL — ABNORMAL LOW (ref 1.7–2.4)

## 2022-07-03 MED ORDER — MAGNESIUM OXIDE -MG SUPPLEMENT 400 (240 MG) MG PO TABS
800.0000 mg | ORAL_TABLET | Freq: Every day | ORAL | Status: AC
  Administered 2022-07-03 – 2022-07-05 (×3): 800 mg via ORAL
  Filled 2022-07-03 (×3): qty 2

## 2022-07-03 MED ORDER — DEXTROSE 5 % IV SOLN: INTRAVENOUS | Status: AC

## 2022-07-03 MED ORDER — PANTOPRAZOLE SODIUM 40 MG PO TBEC
40.0000 mg | DELAYED_RELEASE_TABLET | Freq: Every day | ORAL | Status: DC
  Administered 2022-07-03 – 2022-07-09 (×7): 40 mg via ORAL
  Filled 2022-07-03 (×7): qty 1

## 2022-07-03 MED ORDER — DILTIAZEM HCL ER COATED BEADS 120 MG PO CP24
240.0000 mg | ORAL_CAPSULE | Freq: Every day | ORAL | Status: DC
  Administered 2022-07-03 – 2022-07-09 (×7): 240 mg via ORAL
  Filled 2022-07-03 (×7): qty 2

## 2022-07-03 NOTE — Evaluation (Signed)
Occupational Therapy Re-Evaluation Patient Details Name: Samantha Mason MRN: [id number] DOB: 01-08-1951 Today's Date: 07/03/2022   History of Present Illness 72 y/o female with h/o morbid obesity, OSA, HTN, COPD, CHF, CKD IV, GERD, IBS, RLS and recent admission for SBO (medically managed) who is admitted with CHF, sepsis, UTI, AKI and cellulitis. Pt extubated on 1/31.   Clinical Impression   Patient seen for re-evaluation. Chart reviewed to date, pt received supine in bed with sitter present. Mitts in place. Co-treatment completed this date to maximize safety and participation. Pt unable to state name upon request and was only able to verbalize a couple words t/o session. Pt with very poor cognition this date compared to when last seen by OT. She was unable to follow one step commands. She required Total A +2 for supine<>sit, close supervision for sitting balance at EOB, Max multimodal cues for seated grooming tasks, and total A for LB dressing at bed level. Pt left as received with all needs in reach. Reviewed goals and updated based on progress. Pt continues to benefit from skilled OT to maximize safety and independence.     Recommendations for follow up therapy are one component of a multi-disciplinary discharge planning process, led by the attending physician.  Recommendations may be updated based on patient status, additional functional criteria and insurance authorization.   Follow Up Recommendations  Skilled nursing-short term rehab (<3 hours/day)     Assistance Recommended at Discharge Frequent or constant Supervision/Assistance  Patient can return home with the following Two people to help with walking and/or transfers;Two people to help with bathing/dressing/bathroom;Assistance with cooking/housework;Help with stairs or ramp for entrance;Assist for transportation;Direct supervision/assist for financial management;Direct supervision/assist for medications management    Functional  Status Assessment  Patient has had a recent decline in their functional status and demonstrates the ability to make significant improvements in function in a reasonable and predictable amount of time.  Equipment Recommendations  Other (comment) (per next venue of care)    Recommendations for Other Services       Precautions / Restrictions Precautions Precautions: Fall Restrictions Weight Bearing Restrictions: No      Mobility Bed Mobility Overal bed mobility: Needs Assistance Bed Mobility: Supine to Sit, Sit to Supine     Supine to sit: Total assist, +2 for physical assistance, +2 for safety/equipment Sit to supine: Total assist, +2 for safety/equipment, +2 for physical assistance   General bed mobility comments: +2 to scoot to [place] transfer comment: unable/unsafe to attempt at this time      Balance Overall balance assessment: Needs assistance Sitting-balance support: Feet supported Sitting balance-Leahy Scale: Fair Sitting balance - Comments: close supervision static sitting EOB, unable to complete lateral scooting                                   ADL either performed or assessed with clinical judgement   ADL Overall ADL's : Needs assistance/impaired     Grooming: Cueing for sequencing;Wash/dry face;Sitting;Set up;Supervision/safety Grooming Details (indicate cue type and reason): Max multimodal cues             Lower Body Dressing: Bed level;Total assistance       Toileting- Clothing Manipulation and Hygiene: Total assistance;Bed level         General ADL Comments:  Required increased assistance for self-care tasks 2/2 impaired cognition and inconsistently following one step commands     Vision Patient Visual Report: No change from baseline       Perception     Praxis      Pertinent Vitals/Pain Pain Assessment Pain Assessment: Faces Faces Pain Scale: Hurts little more Pain  Location: L foot with touch, movement Pain Descriptors / Indicators: Discomfort, Grimacing Pain Intervention(s): Monitored during session, Repositioned     Hand Dominance Left   Extremity/Trunk Assessment Upper Extremity Assessment Upper Extremity Assessment: Generalized weakness   Lower Extremity Assessment Lower Extremity Assessment: Generalized weakness       Communication Communication Communication: Expressive difficulties   Cognition Arousal/Alertness: Awake/alert Behavior During Therapy: Restless Overall Cognitive Status: Impaired/Different from baseline Area of Impairment: Orientation, Attention, Memory, Problem solving, Awareness, Safety/judgement, Following commands                 Orientation Level: Disoriented to, Place, Time, Situation, Person Current Attention Level: Focused Memory: Decreased recall of precautions, Decreased short-term memory Following Commands: Follows one step commands inconsistently Safety/Judgement: Decreased awareness of safety, Decreased awareness of deficits Awareness: Intellectual Problem Solving: Slow processing, Decreased initiation, Difficulty sequencing, Requires verbal cues, Requires tactile cues General Comments: Pt unable to state name, moaning & groaning throughout session, poor ability to follow one step commands     General Comments       Exercises     Shoulder Instructions      Home Living Family/patient expects to be discharged to:: Skilled nursing facility Living Arrangements: Children Available Help at Discharge: Family;Available PRN/intermittently Type of Home: House Home Access: Stairs to enter [employer] of Steps: 3 Entrance Stairs-Rails: Right Home Layout: One level     Bathroom Shower/Tub: Tub/shower unit         Home Equipment: Advice worker (2 wheels);Cane - single point;BSC/3in1   Additional Comments: Equipment belongs to her mother who has passed away but she does  not utilize at baseline      Prior Functioning/Environment Prior Level of Function : Independent/Modified Independent             Mobility Comments: did not use AD prior to admission ADLs Comments: Pt endorses being Ind in self care tasks with son performing majority of IADL tasks and driving her to appointments as needed. Pt does not use AD for mobility at baseline.        OT Problem List: Decreased strength;Cardiopulmonary status limiting activity;Decreased activity tolerance;Decreased safety awareness;Impaired balance (sitting and/or standing);Decreased knowledge of use of DME or AE      OT Treatment/Interventions: Self-care/ADL training;Therapeutic exercise;Therapeutic activities;Energy conservation;DME and/or AE instruction;Balance training;Patient/family education;Cognitive remediation/compensation    OT Goals(Current goals can be found in the care plan section) Acute Rehab OT Goals Patient Stated Goal: unable to state OT Goal Formulation: Patient unable to participate in goal setting Time For Goal Achievement: 07/17/22 Potential to Achieve Goals: Fair   OT Frequency: Min 2X/week    Co-evaluation PT/OT/SLP Co-Evaluation/Treatment: Yes Reason for Co-Treatment: Complexity of the patient's impairments (multi-system involvement);Necessary to address cognition/behavior during functional activity;For patient/therapist safety;To address functional/ADL transfers PT goals addressed during session: Mobility/safety with mobility;Balance;Strengthening/ROM OT goals addressed during session: ADL's and self-care      AM-PAC OT "6 Clicks" Daily Activity     Outcome Measure Help from another person eating meals?: A Lot Help from another person taking care of personal grooming?: A Lot Help from another person toileting, which includes using toliet, bedpan, or  urinal?: Total Help from another person bathing (including washing, rinsing, drying)?: Total Help from another person to put on  and taking off regular upper body clothing?: A Lot Help from another person to put on and taking off regular lower body clothing?: Total 6 Click Score: 9   End of Session Equipment Utilized During Treatment: Oxygen Nurse Communication: Mobility status;Other (comment) (impaired cognition)  Activity Tolerance: Other (comment) (limited 2/2 impaired cognition) Patient left: in bed;with call bell/phone within reach;with bed alarm set;with restraints reapplied  OT Visit Diagnosis: Muscle weakness (generalized) (M62.81);Unsteadiness on feet (R26.81)                Time: [id number] OT Time Calculation (min): 16 min Charges:  OT General Charges $OT Visit: 1 Visit OT Evaluation $OT Re-eval: 1 Re-eval  [facility] MS, OTR/L ascom [phone]  07/03/22, 5:21 PM

## 2022-07-03 NOTE — Progress Notes (Signed)
Physical Therapy Treatment Patient Details Name: Samantha Mason MRN: [id number] DOB: Apr 19, 1951 Today's Date: 07/03/2022   History of Present Illness 72 y/o female with h/o morbid obesity, OSA, HTN, COPD, CHF, CKD IV, GERD, IBS, RLS and recent admission for SBO (medically managed) who is admitted with CHF, sepsis, UTI, AKI and cellulitis. Pt extubated on 1/31.    PT Comments    Pt seen for PT tx with co-tx with OT. Pt received in room, only able to verbalize a couple words during session, otherwise she was moaning & groaning throughout session. Pt with very poor cognition, worse than the last time this PT saw her. Pt unable to follow simple commands throughout session. Pt requires max<>total assist +2 for supine<>sit but is able to sit EOB with supervision, unsafe to attempt STS even with +2 assist on this date. Continue to recommend STR upon d/c.   PT goals reviewed, all remain appropriate at this time.    Recommendations for follow up therapy are one component of a multi-disciplinary discharge planning process, led by the attending physician.  Recommendations may be updated based on patient status, additional functional criteria and insurance authorization.  Follow Up Recommendations  Skilled nursing-short term rehab (<3 hours/day) Can patient physically be transported by private vehicle: No   Assistance Recommended at Discharge Frequent or constant Supervision/Assistance  Patient can return home with the following Two people to help with walking and/or transfers;Two people to help with bathing/dressing/bathroom   Equipment Recommendations  None recommended by PT    Recommendations for Other Services       Precautions / Restrictions Precautions Precautions: Fall Restrictions Weight Bearing Restrictions: No     Mobility  Bed Mobility Overal bed mobility: Needs Assistance Bed Mobility: Supine to Sit, Sit to Supine     Supine to sit: Total assist, +2 for physical  assistance, Max assist, +2 for safety/equipment Sit to supine: Total assist, +2 for safety/equipment, +2 for physical assistance   General bed mobility comments: +2 to scoot to [facility]    Transfers                        Ambulation/Gait                   Stairs             Wheelchair Mobility    Modified Rankin (Stroke Patients Only)       Balance Overall balance assessment: Needs assistance Sitting-balance support: Feet supported Sitting balance-Leahy Scale: Fair                                      Cognition Arousal/Alertness: Awake/alert Behavior During Therapy: Restless Overall Cognitive Status: Impaired/Different from baseline Area of Impairment: Orientation, Attention, Memory, Problem solving, Awareness, Safety/judgement, Following commands                 Orientation Level: Disoriented to, Place, Time, Situation, Person (pt unable to state name) Current Attention Level: Focused Memory: Decreased recall of precautions, Decreased short-term memory Following Commands: Follows one step commands inconsistently, Follows one step commands with increased time Safety/Judgement: Decreased awareness of safety, Decreased awareness of deficits Awareness: Intellectual Problem Solving: Slow processing, Decreased initiation, Difficulty sequencing, Requires verbal cues, Requires tactile cues General Comments: Pt moaning & groaning throughout session, poor ability to follow simple commands, poor ability to visually scan.  Exercises Other Exercises Other Exercises: Pt requires PROM/AAROM to perform LLE LAQ, is able to perform RLE LAQ    General Comments        Pertinent Vitals/Pain Pain Assessment Pain Assessment: Faces Faces Pain Scale: Hurts little more Pain Location: L foot with touch, movement Pain Descriptors / Indicators: Discomfort, Grimacing Pain Intervention(s): Monitored during session, Repositioned    Home  Living                          Prior Function            PT Goals (current goals can now be found in the care plan section) Acute Rehab PT Goals PT Goal Formulation: Patient unable to participate in goal setting Time For Goal Achievement: 07/17/22 Progress towards PT goals:  (Pt making slow progress towards PT goals, all goals remain appropriate at this time.)    Frequency    Min 2X/week      PT Plan Current plan remains appropriate    Co-evaluation PT/OT/SLP Co-Evaluation/Treatment: Yes Reason for Co-Treatment: Complexity of the patient's impairments (multi-system involvement);Necessary to address cognition/behavior during functional activity;For patient/therapist safety;To address functional/ADL transfers PT goals addressed during session: Mobility/safety with mobility;Balance;Strengthening/ROM        AM-PAC PT "6 Clicks" Mobility   Outcome Measure  Help needed turning from your back to your side while in a flat bed without using bedrails?: Total Help needed moving from lying on your back to sitting on the side of a flat bed without using bedrails?: Total Help needed moving to and from a bed to a chair (including a wheelchair)?: Total Help needed standing up from a chair using your arms (e.g., wheelchair or bedside chair)?: Total Help needed to walk in hospital room?: Total Help needed climbing 3-5 steps with a railing? : Total 6 Click Score: 6    End of Session Equipment Utilized During Treatment: Oxygen Activity Tolerance:  (limited 2/2 impaired cognition) Patient left: in bed;with call bell/phone within reach;with bed alarm set Nurse Communication:  (impaired cognition) PT Visit Diagnosis: Muscle weakness (generalized) (M62.81);Other abnormalities of gait and mobility (R26.89);Difficulty in walking, not elsewhere classified (R26.2) Pain - Right/Left: Left Pain - part of body: Leg     Time: [id number] PT Time Calculation (min) (ACUTE ONLY): 16  min  Charges:  $Therapeutic Activity: 8-22 mins                     Lavone Nian, PT, DPT 07/03/22, 4:08 PM   Waunita Schooner 07/03/2022, 4:05 PM

## 2022-07-03 NOTE — Progress Notes (Signed)
Progress Note   Patient: Samantha Mason [id number] DOB: [birth year] DOA: 06/08/2022     25 DOS: the patient was seen and examined on 07/03/2022   Brief hospital course:  72 y.o. female with medical history significant for Morbid obesity, chronic HFpEF,CKD stage III, chronic lower extremity lymphedema on treatment with torsemide, hypertension, asthma, stroke (1995) and obstructive sleep apnea (non compliant with cpap) to ED w/ CC fatigue, LE swelling/redness. Pt was admitted. Course complicated by CO2 narcosis, failed BiPAP requiring intubation and mechanical ventilation.  Transferred to hospitalist service on 06/24/22. Treated for Acute on Chronic HFpEF. Her respiratory failure has been treated with BiPAP, diuresis, and bronchodilators. She has been refusing BiPAP now. Currently on 2L [place]. Diuresis held, Cr worsens despite this but has stabilized, nephrology following.    1/20: Presented to ED, admitted by [facility] [place] with Sepsis in setting of lower extremity Cellulitis, AKI, and Acute on Chronic Hypercapnic Respiratory Failure in setting AECOPD.  1/21: Added cefepime, continue vancomycin, increase dose of Cardizem for better heart rate control 1/22: Had to reduce Cardizem as blood pressure dropped 1/23: Transferred to ICU overnight due to CO2 narcosis, placed on BiPAP.  PCCM consulted.  Worsening AKI, holding diuresis.  Agitation requiring Precedex.  Palliative Care consulted for [place].  ABX broadened back to Cefepime and Vancomycin 1/24: Pt on Bipap but awake and able to follow commands. Attempt to transition off Bipap.  1/25: Pt currently on Bipap and precedex gtt.  Required 2.5 mg iv valium overnight for delirium.  Unable to obtain CT Head due to respiratory status.  Ultimately required intubation. 1/26: Remains critically ill. Issues with vent dyssynchrony and ventilation, plan to start Nimbex gtt. Creatinine improved with diuresis, continue with diuresis, Nephrology considering Lasix gtt. 1/27: ABG's  improved on Nimbex, persistent cuff leak, exchanged ETT. Decadron started due to upper airway swelling. Will attempt to d/c Nimbex. Good diuresis with Lasix gtt with 4.5 L of UOP last 24 hrs, Creatinine improving. 1/28: Continues with good diuresis on Lasix gtt  1/29: No acute events overnight. Remained mechanically intubated with minimal vent settings.  Remains on lasix gtt.  Not on sedation and attempting to follow commands but extremely weak.   1/30: No acute events overnight.  Pt awake and following commands on precedex gtt.  Lasix gtt. Tolerating SBT 15/8 1/31: No acute events, awake and following commands on low dose precedex.  Remains on Lasix gtt,  Creatinine remains stable BUN slowly uptrending. Tolerating SBT 15/8 ~ will continue to wean PSV as tolerated ~ EXTUBATED. Lasix gtt d/c following extubation.  Following extubation, pt confirmed DNR/DNI status in presence of her son at bedside. 2/1: Tolerated BiPAP overnight, weaned to [facility] this am.  Speech evaluation performed, which pt FAILED. Consult PT/OT.  Creatinine continues to improve, started on D5W @ 50 ml/hr due to Hypernatremia of 155.  Holding diuresis today 2/2: Pt febrile on Bipap @50$ %.  Pt awake and following commands.  Failed speech evaluation on 02/1 and declining NGT placement.  2/2: Venous US BLE: No DVT 2/3: Agreeable to small bore NGT placement and dialysis if needed.  Again confirmed DNR/DNI  2/4: Nursing staff reporting pt now tolerating ice chips without signs of aspiration. NG was not needed.  2/5: back to hospitalist service. Creatinine uptrending.  2/6-2/09: Cr not improving but has been stable -  nephrology following, pt agreeable to temporary HD if needed, nephro recs continue to monitor UOP. No acute need for dialysis. Continue to hold diuretics. Liquid stool persist, GI  PCR negative, KUB no concerns.   02/10-02/11: Cr starting to improve some.  02/11 overnight insomnia, given Rx and sleep around 04:30, was a bit more  confused on AM rounds likely med effect + hypercarbia as she has been refusing BiPap - found hypercarbic on ABG and placed back on BiPap and transferred to Progressive unit. UA also concerning for UTI, CXR concerning for PNA. (+)Sepsis criteria  02/12: Cr thankfully improving. Still confused. Remains on BiPap. See IPAL note for discussion w/ son.  02/13: 1/4 BCx(+) likely contaminant. Urine growing yeast. BNP and Procal, neither remarkably elevated, will not administer diuresis but will repeat CXR in AM and d/c abx other than diflucan for yeast in urine. Remains hypercarbic and on BiPap, more alert today but still trying to pull off mask and requiring frequent redirection/sitter.  02/14: Very confused.  Has mittens on.  Continues to attempt to remove IV and had remove the BiPAP mask overnight.  Renal function continues to improve and had peaked at 3.69 it is down to 1.9 today.      Assessment and Plan:  Principal Problem:   Severe sepsis ([place]) Active Problems:   Acute on chronic respiratory failure with hypoxia and hypercapnia (HCC)   Chronic diastolic CHF (congestive heart failure) (HCC)   Essential hypertension   Acute renal failure superimposed on stage 3b chronic kidney disease ([place])   Morbid obesity with BMI of 50.0-59.9, adult (HCC)   OSA    History of stroke   Extreme obesity with alveolar hypoventilation (HCC)   Cellulitis   Acute metabolic encephalopathy   Community acquired pneumonia   Toxic Metabolic Encephalopathy likely CO2 narcosis - resolved then recurred Also concern for multifocal pneumonia and UTI - see below Continue BiPap ABG done today is compensated with no evidence of hypercapnia Oral intake remains very poor and patient appears dehydrated with hypernatremia. Judicious IV fluid administration may need alternative means of providing nutrition   Sepsis/SIRS w/ concern for Multifocal Pneumonia per CXR 02/11 Procalcitonin unremarkable  BNP unremarkable  Cefepime  + vancomycin for multifocal pneumonia acquired in hospital setting --> d/c 02/13 but low threshold to restart if febrile or increased SOB Repeat CXR in AM BCx likely contaminant    Sepsis d/t UTI per UA 02/11 UCx (+)yeast  DIflucan per pharmacy  Limit IV fluids d/t hx HFpEF but BNP no concerns, monitor fluid status closely   Acute on Chronic Kidney Injury likely secondary to severe infection/sepsis on admission, was improving then worse again, concern for volume contraction / diuresis effect - improving now Hypernatremia - resolved Contraction Alkalosis - resolved  holding diuretics, holding IVF management per nephrology - there was some concern about possible needing dialysis but she has been stable for past few days  Appreciate nephrology input Serum creatinine peaked at 3.69 but is down to 1.9.  Will monitor closely   Acute on Chronic Hypoxic and Hypercapnic Respiratory Failure D/t Acute on Chronic HFpEF initially, now concern for restrictive lung disease and multifocal pneumonia  supplemental O2 to keep sats >=90%, wean as tolerated BiPap as above palliative care involved DNR/DNI  continue supportive care with bronchodilators   Acute on Chronic HFpEF - improved Holding diuresis due to renal failure. Avoid aggressive IV fluids  BNP today unremarkable, monitor as needed    Anemia of Chronic Disease Monitor CBC Aranesp weekly    Hypokalemia monitor and replete PRN   Hypernatremia Secondary to poor oral intake.  Patient noted to have very dry mucous membranes Gentle IV  fluid hydration with D5W to administer free water Repeat sodium levels in a.m.                 Physical Exam: Vitals:   07/03/22 0720 07/03/22 0812 07/03/22 1102 07/03/22 1459  BP: (!) 170/73  (!) 140/89 (!) 150/61  Pulse: 94  (!) 107 78  Resp: 18  18 18  $ Temp: 97.8 F (36.6 C)   97.8 F (36.6 C)  TempSrc:      SpO2: 100% 100% 100% 99%  Weight:      Height:       Physical  Exam Vitals and nursing note reviewed.  Constitutional:      Appearance: She is obese.     Comments: Very confused oriented to person.  Has mittens on.  Restless and continues to attempt to remove IV.  Sitter at the bedside.  HENT:     Head: Normocephalic and atraumatic.     Nose: Nose normal.     Mouth/Throat:     Mouth: Mucous membranes are dry.  Eyes:     Conjunctiva/sclera: Conjunctivae normal.  Cardiovascular:     Rate and Rhythm: Tachycardia present.  Pulmonary:     Effort: Pulmonary effort is normal.     Breath sounds: Normal breath sounds.  Abdominal:     General: Abdomen is flat.     Palpations: Abdomen is soft.     Comments: Central adiposity  Musculoskeletal:     Cervical back: Normal range of motion and neck supple.     Right lower leg: Edema present.     Left lower leg: Edema present.  Skin:    General: Skin is warm and dry.  Neurological:     Comments: Unable to assess  Psychiatric:     Comments: Unable to assess     Data Reviewed: Labs reviewed.  Magnesium 1.6, sodium 147, BUN 45, creatinine 1.90 white count 6.7, hemoglobin 9.1 There are no new results to review at this time.  Family Communication: Called and spoke to patient's son over the phone, Samantha Mason over the phone.  Discussed her current condition as well as the need for alternative means of providing nutrition.  He states that he had tried an NG tube when she was in ICU but it was very uncomfortable for her.  Considering PEG tube but will discuss with his sister.  Disposition: Status is: Inpatient Remains inpatient appropriate because: Continues to have electrolyte abnormalities and no improvement in her mental status  Planned Discharge Destination: SNF when stable    Time spent: 40  minutes  Author: Collier Bullock, MD 07/03/2022 4:07 PM  For on call review [email or web address].

## 2022-07-03 NOTE — Plan of Care (Signed)

## 2022-07-03 NOTE — Progress Notes (Signed)
PHARMACIST - PHYSICIAN COMMUNICATION  CONCERNING: IV to Oral Route Change Policy  RECOMMENDATION: This patient is receiving pantoprazole by the intravenous route.  Based on criteria approved by the Pharmacy and Therapeutics Committee, the intravenous medication(s) is/are being converted to the equivalent oral dose form(s).  DESCRIPTION: These criteria include: The patient is eating (either orally or via tube) and/or has been taking other orally administered medications for a least 24 hours The patient has no evidence of active gastrointestinal bleeding or impaired GI absorption (gastrectomy, short bowel, patient on TNA or NPO).  If you have questions about this conversion, please contact the [place] 07/03/2022 9:23 AM

## 2022-07-03 NOTE — Progress Notes (Signed)
Nutrition Follow-up  DOCUMENTATION CODES:   Morbid obesity  INTERVENTION:   -Continue Ensure Enlive po TID, each supplement provides 350 kcal and 20 grams of protein.  -Continue MVI with minerals daily -Magic cup TID with meals, each supplement provides 290 kcal and 9 grams of protein   NUTRITION DIAGNOSIS:   Inadequate oral intake related to inability to eat (pt sedated and ventilated) as evidenced by NPO status.  Progressing; advanced to PO diet on 06/23/22   GOAL:   Patient will meet greater than or equal to 90% of their needs  Progressing   MONITOR:   Diet advancement, Labs, Weight trends, Skin, I & O's  REASON FOR ASSESSMENT:   Consult Enteral/tube feeding initiation and management  ASSESSMENT:   72 y/o female with h/o morbid obesity, OSA, HTN, COPD, CHF, CKD IV, GERD, IBS, RLS and recent admission for SBO (medically managed) who is admitted with CHF, sepsis, UTI, AKI and cellulitis.  1/31- extubated 2/1- s/p BSE- ice chips for comfort 2/4- s/p BSE- advanced to dysphagia 1 diet with nectar thick liquids 2/5- s/p BSE- advanced to dysphagia 3 diet with thin liquids  Reviewed I/O's: -1.2 L x 24 hours and -2.7 L since 06/19/22  UOP: 1.2 L x 24 hours   Pt on bi-pap at time of visit. Unable to contribute any history at this time.   Pt remains on a dysphagia 3 diet. Pt with erratic intake. Noted meal completions 25-75%. Pt is refusing Ensure supplements. Pt has refused NGT placement.   Nephrology following peripherally; pt with poor prognosis and does not want HD. No acute need for HD at this time.   Per TOC notes, plan SNF placement once medically stable for discharge.   Medications reviewed and include magnesium oxide and melatonin.   Labs reviewed: Na: 147, CBGS: 91.    Diet Order:   Diet Order             DIET DYS 3 Room service appropriate? Yes; Fluid consistency: Thin  Diet effective now                   EDUCATION NEEDS:   No education needs  have been identified at this time  Skin:  Skin Assessment: Reviewed RN Assessment  Last BM:  07/01/22 (type 6)  Height:   Ht Readings from Last 1 Encounters:  06/09/22 5' 4"$  (1.626 m)    Weight:   Wt Readings from Last 1 Encounters:  07/03/22 (!) 143.5 kg    Ideal Body Weight:  54.5 kg  BMI:  Body mass index is 54.3 kg/m.  Estimated Nutritional Needs:   Kcal:  1900-2100  Protein:  110-125 grams  Fluid:  > 1.9 L    Loistine Chance, RD, LDN, [place] Registered Dietitian II Certified Diabetes Care and Education Specialist Please refer to [facility] for RD and/or RD on-call/weekend/after hours pager

## 2022-07-04 LAB — CULTURE, BLOOD (ROUTINE X 2): Special Requests: ADEQUATE

## 2022-07-04 LAB — BASIC METABOLIC PANEL
Anion gap: 11 (ref 5–15)
BUN: 38 mg/dL — ABNORMAL HIGH (ref 8–23)
CO2: 30 mmol/L (ref 22–32)
Calcium: 9.2 mg/dL (ref 8.9–10.3)
Chloride: 107 mmol/L (ref 98–111)
Creatinine, Ser: 1.51 mg/dL — ABNORMAL HIGH (ref 0.44–1.00)
GFR, Estimated: 37 mL/min — ABNORMAL LOW (ref >60)
Glucose, Bld: 109 mg/dL — ABNORMAL HIGH (ref 70–99)
Potassium: 3.3 mmol/L — ABNORMAL LOW (ref 3.5–5.1)
Sodium: 148 mmol/L — ABNORMAL HIGH (ref 135–145)

## 2022-07-04 LAB — MAGNESIUM: Magnesium: 1.5 mg/dL — ABNORMAL LOW (ref 1.7–2.4)

## 2022-07-04 MED ORDER — MAGNESIUM SULFATE 2 GM/50ML IV SOLN
2.0000 g | Freq: Once | INTRAVENOUS | Status: AC
  Administered 2022-07-04: 2 g via INTRAVENOUS
  Filled 2022-07-04: qty 50

## 2022-07-04 MED ORDER — FLUCONAZOLE 100 MG PO TABS
200.0000 mg | ORAL_TABLET | Freq: Every day | ORAL | Status: DC
  Administered 2022-07-05 – 2022-07-09 (×5): 200 mg via ORAL
  Filled 2022-07-04 (×5): qty 2

## 2022-07-04 MED ORDER — POTASSIUM CHLORIDE 20 MEQ PO PACK
40.0000 meq | PACK | Freq: Two times a day (BID) | ORAL | Status: AC
  Administered 2022-07-04 – 2022-07-05 (×4): 40 meq via ORAL
  Filled 2022-07-04 (×4): qty 2

## 2022-07-04 NOTE — Progress Notes (Signed)
Physical Therapy Treatment Patient Details Name: Samantha Mason MRN: [id number] DOB: 26-Apr-1951 Today's Date: 07/04/2022   History of Present Illness 72 y/o female with h/o morbid obesity, OSA, HTN, COPD, CHF, CKD IV, GERD, IBS, RLS and recent admission for SBO (medically managed) who is admitted with CHF, sepsis, UTI, AKI and cellulitis. Pt extubated on 1/31.    PT Comments    Pt seen for PT tx with son present in room. Pt received asleep in bed, briefly opens eyes with max encouragement/cuing. With encouragement to initiate pt does participate in scooting to EOB, but still requires max assist for supine>sit, +2 for sit>supine. Pt tolerates sitting EOB ~3 minutes but keeps eyes closed majority of time, unable to increase alertness to pt assisted back to supine. Continue to recommend STR upon d/c.    Recommendations for follow up therapy are one component of a multi-disciplinary discharge planning process, led by the attending physician.  Recommendations may be updated based on patient status, additional functional criteria and insurance authorization.  Follow Up Recommendations  Skilled nursing-short term rehab (<3 hours/day) Can patient physically be transported by private vehicle: No   Assistance Recommended at Discharge Frequent or constant Supervision/Assistance  Patient can return home with the following Two people to help with walking and/or transfers;Two people to help with bathing/dressing/bathroom   Equipment Recommendations  None recommended by PT    Recommendations for Other Services       Precautions / Restrictions Precautions Precautions: Fall Restrictions Weight Bearing Restrictions: No     Mobility  Bed Mobility Overal bed mobility: Needs Assistance Bed Mobility: Supine to Sit, Sit to Supine     Supine to sit: Max assist (assistance to initiate moving BLE towards EOB & placing pts hands on bed rails, pt does participate in uprighting trunk.) Sit to supine:  Total assist, +2 for safety/equipment, +2 for physical assistance   General bed mobility comments: +2 to scoot to [facility]    Transfers                        Ambulation/Gait                   Stairs             Wheelchair Mobility    Modified Rankin (Stroke Patients Only)       Balance Overall balance assessment: Needs assistance Sitting-balance support: Feet supported Sitting balance-Leahy Scale: Fair Sitting balance - Comments: close supervision static sitting EOB                                    Cognition Arousal/Alertness: Lethargic Behavior During Therapy: Flat affect Overall Cognitive Status: Impaired/Different from baseline                                 General Comments: Pt keeps eyes closed throughout majority of session, occasionally opens eyes for a few seconds at a time. Is able to state name of son in room. Otherwise, does not follow commands.        Exercises      General Comments General comments (skin integrity, edema, etc.): Pt on 2L O2 [place] with SpO2 at 97%      Pertinent Vitals/Pain Pain Assessment Pain Assessment: Faces Faces Pain Scale: Hurts a little bit Pain Location: when PT touches  L hand, L foot Pain Descriptors / Indicators: Discomfort, Grimacing Pain Intervention(s): Repositioned, Limited activity within patient's tolerance    Home Living                          Prior Function            PT Goals (current goals can now be found in the care plan section) Acute Rehab PT Goals Patient Stated Goal: get stronger PT Goal Formulation: Patient unable to participate in goal setting Time For Goal Achievement: 07/17/22 Progress towards PT goals: PT to reassess next treatment    Frequency    Min 2X/week      PT Plan Current plan remains appropriate    Co-evaluation              AM-PAC PT "6 Clicks" Mobility   Outcome Measure  Help needed turning from your  back to your side while in a flat bed without using bedrails?: Total Help needed moving from lying on your back to sitting on the side of a flat bed without using bedrails?: Total Help needed moving to and from a bed to a chair (including a wheelchair)?: Total Help needed standing up from a chair using your arms (e.g., wheelchair or bedside chair)?: Total Help needed to walk in hospital room?: Total Help needed climbing 3-5 steps with a railing? : Total 6 Click Score: 6    End of Session Equipment Utilized During Treatment: Oxygen Activity Tolerance: Patient limited by lethargy Patient left: in bed;with call bell/phone within reach;with bed alarm set;with nursing/sitter in room;with family/visitor present   PT Visit Diagnosis: Muscle weakness (generalized) (M62.81);Other abnormalities of gait and mobility (R26.89);Difficulty in walking, not elsewhere classified (R26.2)     Time: [id number] PT Time Calculation (min) (ACUTE ONLY): 12 min  Charges:  $Therapeutic Activity: 8-22 mins                     Lavone Nian, PT, DPT 07/04/22, 4:20 PM   Waunita Schooner 07/04/2022, 4:19 PM

## 2022-07-04 NOTE — Progress Notes (Signed)
Mobility Specialist - Progress Note   07/04/22 1151  Mobility  Activity Stood at bedside;Dangled on edge of bed  Level of Assistance +2 (takes two people)  Information systems manager Ambulated (ft) 0 ft  Activity Response Tolerated well  Mobility Referral Yes  $Mobility charge 1 Mobility   Pt supine in bed on bi-pap. Pt completes bed mobility ModA. Pt STS with ModA +2 and able to maintain standing balance for 90~120 seconds twice. Pt returns to EOB and needs ModA to reposition in bed. Pt needs VC throughout session for safety awareness. Pt left in bed with needs in reach, bed alarm set, and NT in room.   Samantha Mason  Mobility Specialist  07/04/22 11:54 AM

## 2022-07-04 NOTE — Progress Notes (Signed)
PHARMACIST - PHYSICIAN COMMUNICATION  CONCERNING: Antibiotic IV to Oral Route Change Policy  RECOMMENDATION: This patient is receiving fluconazole by the intravenous route.  Based on criteria approved by the Pharmacy and Therapeutics Committee, the antibiotic(s) is/are being converted to the equivalent oral dose form(s).   DESCRIPTION: These criteria include: Patient being treated for a respiratory tract infection, urinary tract infection, cellulitis or clostridium difficile associated diarrhea if on metronidazole The patient is not neutropenic and does not exhibit a GI malabsorption state The patient is eating (either orally or via tube) and/or has been taking other orally administered medications for a least 24 hours The patient is improving clinically and has a Tmax < 100.5  If you have questions about this conversion, please contact the [facility] 07/04/22

## 2022-07-04 NOTE — Progress Notes (Signed)
Occupational Therapy Treatment Patient Details Name: Samantha Mason MRN: [id number] DOB: 06-19-1950 Today's Date: 07/04/2022   History of present illness 72 y/o female with h/o morbid obesity, OSA, HTN, COPD, CHF, CKD IV, GERD, IBS, RLS and recent admission for SBO (medically managed) who is admitted with CHF, sepsis, UTI, AKI and cellulitis. Pt extubated on 1/31.   OT comments  Samantha Mason is more alert and oriented and demonstrates improved mobility compared to yesterday's rehab sessions. Today pt is able to provide her name and is aware she is at [facility]. With extended time and repetition of words, pt is able to state that she wants to sit up. She requires Mod A +1 for supine<>sit transfers. Pt is able to maintain sitting balance at EOB for ~ 6 minutes and to open milk container and feed self, bringing both straw and fork to mouth without spillage. Pt does not respond to questioning re: dates, situation, pain; she does not display behaviors indicative of pain. Pt's O2 now reduced to 2L [place], with O2 sats remaining at 97% throughout session. Recommend SNF post-DC.   Recommendations for follow up therapy are one component of a multi-disciplinary discharge planning process, led by the attending physician.  Recommendations may be updated based on patient status, additional functional criteria and insurance authorization.    Follow Up Recommendations  Skilled nursing-short term rehab (<3 hours/day)     Assistance Recommended at Discharge Frequent or constant Supervision/Assistance  Patient can return home with the following  A lot of help with walking and/or transfers;A lot of help with bathing/dressing/bathroom;Assistance with cooking/housework;Assistance with feeding;Direct supervision/assist for medications management;Help with stairs or ramp for entrance;Assist for transportation   Equipment Recommendations       Recommendations for Other Services      Precautions / Restrictions  Precautions Precautions: Fall Restrictions Weight Bearing Restrictions: No       Mobility Bed Mobility Overal bed mobility: Needs Assistance Bed Mobility: Supine to Sit, Sit to Supine     Supine to sit: Mod assist Sit to supine: Mod assist   General bed mobility comments: Mod A +1 for supine<>sit and for scooting towards HOB in supine    Transfers                   General transfer comment: unable/unsafe to attempt at this time     Balance Overall balance assessment: Needs assistance Sitting-balance support: Feet supported Sitting balance-Leahy Scale: Good         Standing balance comment: NT                           ADL either performed or assessed with clinical judgement   ADL Overall ADL's : Needs assistance/impaired Eating/Feeding: Set up;Supervision/ safety Eating/Feeding Details (indicate cue type and reason): able to open containers and feed self following set-up; takes only a few bites before tiring                                        Extremity/Trunk Assessment Upper Extremity Assessment Upper Extremity Assessment: Generalized weakness   Lower Extremity Assessment Lower Extremity Assessment: Generalized weakness        Vision       Perception     Praxis      Cognition Arousal/Alertness: Awake/alert Behavior During Therapy: Flat affect Overall Cognitive Status: Impaired/Different from baseline  Orientation Level: Situation, Time     Following Commands: Follows one step commands inconsistently       General Comments: Able to provide name, state she is at [facility]. Does not respond to any other questions, inconsistently responds to one-step directions        Exercises      Shoulder Instructions       General Comments Pt on 2L O2 [place] with SpO2 at 97%    Pertinent Vitals/ Pain       Pain Assessment Pain Assessment: No/denies pain  Home Living                                           Prior Functioning/Environment              Frequency  Min 2X/week        Progress Toward Goals  OT Goals(current goals can now be found in the care plan section)  Progress towards OT goals: Progressing toward goals  Acute Rehab OT Goals OT Goal Formulation: Patient unable to participate in goal setting Time For Goal Achievement: 07/17/22 Potential to Achieve Goals: [place] Discharge plan remains appropriate;Frequency remains appropriate    Co-evaluation                 AM-PAC OT "6 Clicks" Daily Activity     Outcome Measure   Help from another person eating meals?: A Little Help from another person taking care of personal grooming?: A Lot Help from another person toileting, which includes using toliet, bedpan, or urinal?: A Lot Help from another person bathing (including washing, rinsing, drying)?: A Lot Help from another person to put on and taking off regular upper body clothing?: A Lot Help from another person to put on and taking off regular lower body clothing?: A Lot 6 Click Score: 13    End of Session Equipment Utilized During Treatment: Oxygen  OT Visit Diagnosis: Muscle weakness (generalized) (M62.81);Unsteadiness on feet (R26.81)   Activity Tolerance Patient tolerated treatment well   Patient Left in bed;with call bell/phone within reach;with nursing/sitter in room   Nurse Communication          Time: [id number] OT Time Calculation (min): 19 min  Charges: OT General Charges $OT Visit: 1 Visit OT Treatments $Self Care/Home Management : 8-22 mins Josiah Lobo, PhD, MS, OTR/L 07/04/22, 1:47 PM

## 2022-07-04 NOTE — Progress Notes (Signed)
. Progress Note   PatientSAAVI Mason [id number] DOB: 1950-12-20 DOA: 06/08/2022     26 DOS: the patient was seen and examined on 07/04/2022   Brief hospital course:  72 y.o. female with medical history significant for Morbid obesity, chronic HFpEF,CKD stage III, chronic lower extremity lymphedema on treatment with torsemide, hypertension, asthma, stroke (1995) and obstructive sleep apnea (non compliant with cpap) to ED w/ CC fatigue, LE swelling/redness. Pt was admitted. Course complicated by CO2 narcosis, failed BiPAP requiring intubation and mechanical ventilation.  Transferred to hospitalist service on 06/24/22. Treated for Acute on Chronic HFpEF. Her respiratory failure has been treated with BiPAP, diuresis, and bronchodilators. She has been refusing BiPAP now. Currently on 2L [place]. Diuresis held, Cr worsens despite this but has stabilized, nephrology following.    1/20: Presented to ED, admitted by [facility] with Sepsis in setting of lower extremity Cellulitis, AKI, and Acute on Chronic Hypercapnic Respiratory Failure in setting AECOPD.  1/21: Added cefepime, continue vancomycin, increase dose of Cardizem for better heart rate control 1/22: Had to reduce Cardizem as blood pressure dropped 1/23: Transferred to ICU overnight due to CO2 narcosis, placed on BiPAP.  PCCM consulted.  Worsening AKI, holding diuresis.  Agitation requiring Precedex.  Palliative Care consulted for [place].  ABX broadened back to Cefepime and Vancomycin 1/24: Pt on Bipap but awake and able to follow commands. Attempt to transition off Bipap.  1/25: Pt currently on Bipap and precedex gtt.  Required 2.5 mg iv valium overnight for delirium.  Unable to obtain CT Head due to respiratory status.  Ultimately required intubation. 1/26: Remains critically ill. Issues with vent dyssynchrony and ventilation, plan to start Nimbex gtt. Creatinine improved with diuresis, continue with diuresis, Nephrology considering Lasix gtt. 1/27: ABG's  improved on Nimbex, persistent cuff leak, exchanged ETT. Decadron started due to upper airway swelling. Will attempt to d/c Nimbex. Good diuresis with Lasix gtt with 4.5 L of UOP last 24 hrs, Creatinine improving. 1/28: Continues with good diuresis on Lasix gtt  1/29: No acute events overnight. Remained mechanically intubated with minimal vent settings.  Remains on lasix gtt.  Not on sedation and attempting to follow commands but extremely weak.   1/30: No acute events overnight.  Pt awake and following commands on precedex gtt.  Lasix gtt. Tolerating SBT 15/8 1/31: No acute events, awake and following commands on low dose precedex.  Remains on Lasix gtt,  Creatinine remains stable BUN slowly uptrending. Tolerating SBT 15/8 ~ will continue to wean PSV as tolerated ~ EXTUBATED. Lasix gtt d/c following extubation.  Following extubation, pt confirmed DNR/DNI status in presence of her son at bedside. 2/1: Tolerated BiPAP overnight, weaned to [facility] this am.  Speech evaluation performed, which pt FAILED. Consult PT/OT.  Creatinine continues to improve, started on D5W @ 50 ml/hr due to Hypernatremia of 155.  Holding diuresis today 2/2: Pt febrile on Bipap @50$ %.  Pt awake and following commands.  Failed speech evaluation on 02/1 and declining NGT placement.  2/2: Venous US BLE: No DVT 2/3: Agreeable to small bore NGT placement and dialysis if needed.  Again confirmed DNR/DNI  2/4: Nursing staff reporting pt now tolerating ice chips without signs of aspiration. NG was not needed.  2/5: back to hospitalist service. Creatinine uptrending.  2/6-2/09: Cr not improving but has been stable -  nephrology following, pt agreeable to temporary HD if needed, nephro recs continue to monitor UOP. No acute need for dialysis. Continue to hold diuretics. Liquid stool persist,  GI PCR negative, KUB no concerns.   02/10-02/11: Cr starting to improve some.  02/11 overnight insomnia, given Rx and sleep around 04:30, was a bit more  confused on AM rounds likely med effect + hypercarbia as she has been refusing BiPap - found hypercarbic on ABG and placed back on BiPap and transferred to Progressive unit. UA also concerning for UTI, CXR concerning for PNA. (+)Sepsis criteria  02/12: Cr thankfully improving. Still confused. Remains on BiPap. See IPAL note for discussion w/ son.  02/13: 1/4 BCx(+) likely contaminant. Urine growing yeast. BNP and Procal, neither remarkably elevated, will not administer diuresis but will repeat CXR in AM and d/c abx other than diflucan for yeast in urine. Remains hypercarbic and on BiPap, more alert today but still trying to pull off mask and requiring frequent redirection/sitter.  02/14: Very confused.  Has mittens on.  Continues to attempt to remove IV and had remove the BiPAP mask overnight.  Renal function continues to improve and had peaked at 3.69 it is down to 1.9 today. 2/15 : Patient seen and examined this morning.  Continues to stay confused.  Was on BiPAP overnight.  No acute overnight event reported by nursing.  Labs with hypokalemia and hypomagnesemia repleted..  Continues to stay on 2 L nasal cannula in the day.  Has completed treatment for pneumonia along threshold for reinitiating antibiotics if spikes fever.      Assessment and Plan:  Principal Problem:   Severe sepsis ([place]) Active Problems:   Acute on chronic respiratory failure with hypoxia and hypercapnia (HCC)   Chronic diastolic CHF (congestive heart failure) (HCC)   Essential hypertension   Acute renal failure superimposed on stage 3b chronic kidney disease ([place])   Morbid obesity with BMI of 50.0-59.9, adult (HCC)   OSA    History of stroke   Extreme obesity with alveolar hypoventilation (HCC)   Cellulitis   Acute metabolic encephalopathy   Community acquired pneumonia   Toxic Metabolic Encephalopathy likely CO2 narcosis - resolved then recurred Also concern for multifocal pneumonia and UTI - see below Continue  BiPap ABG done today is compensated with no evidence of hypercapnia Oral intake remains very poor and patient appears dehydrated with hypernatremia. Judicious IV fluid administration may need alternative means of providing nutrition   Sepsis/SIRS w/ concern for Multifocal Pneumonia per CXR 02/11 Procalcitonin unremarkable  BNP unremarkable  Cefepime + vancomycin for multifocal pneumonia acquired in hospital setting --> d/c 02/13 but low threshold to restart if febrile or increased SOB Repeat CXR in AM BCx likely contaminant    Sepsis d/t UTI per UA 02/11 UCx (+)yeast  DIflucan per pharmacy  Limit IV fluids d/t hx HFpEF but BNP no concerns, monitor fluid status closely   Acute on Chronic Kidney Injury likely secondary to severe infection/sepsis on admission, was improving then worse again, concern for volume contraction / diuresis effect - improving now Hypernatremia - resolved Contraction Alkalosis - resolved  holding diuretics, holding IVF management per nephrology - there was some concern about possible needing dialysis but she has been stable for past few days  Appreciate nephrology input Serum creatinine peaked at 3.69 but is down to 1.9.  Will monitor closely   Acute on Chronic Hypoxic and Hypercapnic Respiratory Failure D/t Acute on Chronic HFpEF initially, now concern for restrictive lung disease and multifocal pneumonia  supplemental O2 to keep sats >=90%, wean as tolerated BiPap as above palliative care involved DNR/DNI  continue supportive care with bronchodilators   Acute  on Chronic HFpEF - improved Holding diuresis due to renal failure. Avoid aggressive IV fluids  BNP today unremarkable, monitor as needed    Anemia of Chronic Disease Monitor CBC Aranesp weekly    Hypokalemia monitor and replete PRN   Hypernatremia Secondary to poor oral intake.  Patient noted to have very dry mucous membranes Gentle IV fluid hydration with D5W to administer free  water Repeat sodium levels in a.m.                 Physical Exam: Vitals:   07/04/22 0413 07/04/22 0738 07/04/22 0806 07/04/22 0809  BP:  (!) 162/79    Pulse:  95  79  Resp:  (!) 24  (!) 21  Temp:  98.7 F (37.1 C)    TempSrc:  Axillary    SpO2:  100% 100% 100%  Weight: (!) 140.3 kg     Height:       Physical Exam Vitals and nursing note reviewed.  Constitutional:      Appearance: She is obese.     Comments: Very confused oriented to person.  Has mittens on.  Restless and continues to attempt to remove IV.  Sitter at the bedside.  HENT:     Head: Normocephalic and atraumatic.     Nose: Nose normal.     Mouth/Throat:     Mouth: Mucous membranes are dry.  Eyes:     Conjunctiva/sclera: Conjunctivae normal.  Cardiovascular:     Rate and Rhythm: Tachycardia present.  Pulmonary:     Effort: Pulmonary effort is normal.     Breath sounds: Normal breath sounds.  Abdominal:     General: Abdomen is flat.     Palpations: Abdomen is soft.     Comments: Central adiposity  Musculoskeletal:     Cervical back: Normal range of motion and neck supple.     Right lower leg: Edema present.     Left lower leg: Edema present.  Skin:    General: Skin is warm and dry.  Neurological:     Comments: Unable to assess  Psychiatric:     Comments: Unable to assess     Data Reviewed: Labs reviewed.  Magnesium 1.6, sodium 147, BUN 45, creatinine 1.90 white count 6.7, hemoglobin 9.1 There are no new results to review at this time.  Family Communication: Called and spoke to patient's son over the phone, Samantha Mason over the phone.  Discussed her current condition as well as the need for alternative means of providing nutrition.  He states that he had tried an NG tube when she was in ICU but it was very uncomfortable for her.  Considering PEG tube but will discuss with his sister.  Disposition: Status is: Inpatient Remains inpatient appropriate because: Continues to have  electrolyte abnormalities and no improvement in her mental status  Planned Discharge Destination: SNF when stable    Time spent: 40  minutes  Author: Oran Rein, MD 07/04/2022 1:41 PM  For on call review [email or web address].

## 2022-07-05 LAB — URINALYSIS, W/ REFLEX TO CULTURE (INFECTION SUSPECTED)
Bilirubin Urine: NEGATIVE
Glucose, UA: NEGATIVE mg/dL
Ketones, ur: NEGATIVE mg/dL
Nitrite: NEGATIVE
Protein, ur: 30 mg/dL — AB
Specific Gravity, Urine: 1.015 (ref 1.005–1.030)
WBC, UA: >50 WBC/hpf (ref 0–5)
pH: 5 (ref 5.0–8.0)

## 2022-07-05 LAB — BASIC METABOLIC PANEL
Anion gap: 8 (ref 5–15)
BUN: 32 mg/dL — ABNORMAL HIGH (ref 8–23)
CO2: 32 mmol/L (ref 22–32)
Calcium: 9.3 mg/dL (ref 8.9–10.3)
Chloride: 108 mmol/L (ref 98–111)
Creatinine, Ser: 1.29 mg/dL — ABNORMAL HIGH (ref 0.44–1.00)
GFR, Estimated: 45 mL/min — ABNORMAL LOW (ref >60)
Glucose, Bld: 118 mg/dL — ABNORMAL HIGH (ref 70–99)
Potassium: 3.7 mmol/L (ref 3.5–5.1)
Sodium: 148 mmol/L — ABNORMAL HIGH (ref 135–145)

## 2022-07-05 LAB — MAGNESIUM: Magnesium: 1.9 mg/dL (ref 1.7–2.4)

## 2022-07-05 MED ORDER — ENOXAPARIN SODIUM 40 MG/0.4ML IJ SOSY
40.0000 mg | PREFILLED_SYRINGE | Freq: Two times a day (BID) | INTRAMUSCULAR | Status: DC
  Administered 2022-07-05 – 2022-07-09 (×8): 40 mg via SUBCUTANEOUS
  Filled 2022-07-05 (×8): qty 0.4

## 2022-07-05 MED ORDER — DEXTROSE 5 % IV SOLN: INTRAVENOUS | Status: DC

## 2022-07-05 NOTE — Progress Notes (Signed)
.. Progress Note   Patient: Samantha Mason [id number] DOB: 05/08/1951 DOA: 06/08/2022     27 DOS: the patient was seen and examined on 07/05/2022   Brief hospital course:  72 y.o. female with medical history significant for Morbid obesity, chronic HFpEF,CKD stage III, chronic lower extremity lymphedema on treatment with torsemide, hypertension, asthma, stroke (1995) and obstructive sleep apnea (non compliant with cpap) to ED w/ CC fatigue, LE swelling/redness. Pt was admitted. Course complicated by CO2 narcosis, failed BiPAP requiring intubation and mechanical ventilation.  Transferred to hospitalist service on 06/24/22. Treated for Acute on Chronic HFpEF. Her respiratory failure has been treated with BiPAP, diuresis, and bronchodilators. She has been refusing BiPAP now. Currently on 2L [place]. Diuresis held, Cr worsens despite this but has stabilized, nephrology following.    1/20: Presented to ED, admitted by [facility] with Sepsis in setting of lower extremity Cellulitis, AKI, and Acute on Chronic Hypercapnic Respiratory Failure in setting AECOPD.  1/21: Added cefepime, continue vancomycin, increase dose of Cardizem for better heart rate control 1/22: Had to reduce Cardizem as blood pressure dropped 1/23: Transferred to ICU overnight due to CO2 narcosis, placed on BiPAP.  PCCM consulted.  Worsening AKI, holding diuresis.  Agitation requiring Precedex.  Palliative Care consulted for [place].  ABX broadened back to Cefepime and Vancomycin 1/24: Pt on Bipap but awake and able to follow commands. Attempt to transition off Bipap.  1/25: Pt currently on Bipap and precedex gtt.  Required 2.5 mg iv valium overnight for delirium.  Unable to obtain CT Head due to respiratory status.  Ultimately required intubation. 1/26: Remains critically ill. Issues with vent dyssynchrony and ventilation, plan to start Nimbex gtt. Creatinine improved with diuresis, continue with diuresis, Nephrology considering Lasix gtt. 1/27: ABG's  improved on Nimbex, persistent cuff leak, exchanged ETT. Decadron started due to upper airway swelling. Will attempt to d/c Nimbex. Good diuresis with Lasix gtt with 4.5 L of UOP last 24 hrs, Creatinine improving. 1/28: Continues with good diuresis on Lasix gtt  1/29: No acute events overnight. Remained mechanically intubated with minimal vent settings.  Remains on lasix gtt.  Not on sedation and attempting to follow commands but extremely weak.   1/30: No acute events overnight.  Pt awake and following commands on precedex gtt.  Lasix gtt. Tolerating SBT 15/8 1/31: No acute events, awake and following commands on low dose precedex.  Remains on Lasix gtt,  Creatinine remains stable BUN slowly uptrending. Tolerating SBT 15/8 ~ will continue to wean PSV as tolerated ~ EXTUBATED. Lasix gtt d/c following extubation.  Following extubation, pt confirmed DNR/DNI status in presence of her son at bedside. 2/1: Tolerated BiPAP overnight, weaned to [facility] this am.  Speech evaluation performed, which pt FAILED. Consult PT/OT.  Creatinine continues to improve, started on D5W @ 50 ml/hr due to Hypernatremia of 155.  Holding diuresis today 2/2: Pt febrile on Bipap @50$ %.  Pt awake and following commands.  Failed speech evaluation on 02/1 and declining NGT placement.  2/2: Venous US BLE: No DVT 2/3: Agreeable to small bore NGT placement and dialysis if needed.  Again confirmed DNR/DNI  2/4: Nursing staff reporting pt now tolerating ice chips without signs of aspiration. NG was not needed.  2/5: back to hospitalist service. Creatinine uptrending.  2/6-2/09: Cr not improving but has been stable -  nephrology following, pt agreeable to temporary HD if needed, nephro recs continue to monitor UOP. No acute need for dialysis. Continue to hold diuretics. Liquid stool persist,  GI PCR negative, KUB no concerns.   02/10-02/11: Cr starting to improve some.  02/11 overnight insomnia, given Rx and sleep around 04:30, was a bit more  confused on AM rounds likely med effect + hypercarbia as she has been refusing BiPap - found hypercarbic on ABG and placed back on BiPap and transferred to Progressive unit. UA also concerning for UTI, CXR concerning for PNA. (+)Sepsis criteria  02/12: Cr thankfully improving. Still confused. Remains on BiPap. See IPAL note for discussion w/ son.  02/13: 1/4 BCx(+) likely contaminant. Urine growing yeast. BNP and Procal, neither remarkably elevated, will not administer diuresis but will repeat CXR in AM and d/c abx other than diflucan for yeast in urine. Remains hypercarbic and on BiPap, more alert today but still trying to pull off mask and requiring frequent redirection/sitter.  02/14: Very confused.  Has mittens on.  Continues to attempt to remove IV and had remove the BiPAP mask overnight.  Renal function continues to improve and had peaked at 3.69 it is down to 1.9 today. 2/15 : Patient seen and examined this morning.  Continues to stay confused.  Was on BiPAP overnight.  No acute overnight event reported by nursing.  Labs with hypokalemia and hypomagnesemia repleted..  Continues to stay on 2 L nasal cannula in the day.  Has completed treatment for pneumonia along threshold for reinitiating antibiotics if spikes fever. 2/16 : Patient continues to stay lethargic.  On nasal cannula 2 to 3 L.  Renal functions improving but worsening hypernatremia 148.  Patient started on D5 at 85.  Still deconditioned      Assessment and Plan:  Principal Problem:   Severe sepsis ([place]) Active Problems:   Acute on chronic respiratory failure with hypoxia and hypercapnia (HCC)   Chronic diastolic CHF (congestive heart failure) (HCC)   Essential hypertension   Acute renal failure superimposed on stage 3b chronic kidney disease ([place])   Morbid obesity with BMI of 50.0-59.9, adult (HCC)   OSA    History of stroke   Extreme obesity with alveolar hypoventilation (HCC)   Cellulitis   Acute metabolic encephalopathy    Community acquired pneumonia   Toxic Metabolic Encephalopathy likely CO2 narcosis - resolved then recurred Also concern for multifocal pneumonia and UTI - see below Continue BiPap ABG done today is compensated with no evidence of hypercapnia Oral intake remains very poor and patient appears dehydrated with hypernatremia. Judicious IV fluid administration may need alternative means of providing nutrition   Sepsis/SIRS w/ concern for Multifocal Pneumonia per CXR 02/11 Procalcitonin unremarkable  BNP unremarkable  Cefepime + vancomycin for multifocal pneumonia acquired in hospital setting --> d/c 02/13 but low threshold to restart if febrile or increased SOB Repeat CXR in AM BCx likely contaminant    Sepsis d/t UTI per UA 02/11 UCx (+)yeast  DIflucan per pharmacy  Limit IV fluids d/t hx HFpEF but BNP no concerns, monitor fluid status closely   Acute on Chronic Kidney Injury likely secondary to severe infection/sepsis on admission, was improving then worse again, concern for volume contraction / diuresis effect - improving now Hypernatremia - resolved Contraction Alkalosis - resolved  holding diuretics, holding IVF management per nephrology - there was some concern about possible needing dialysis but she has been stable for past few days  Appreciate nephrology input Serum creatinine peaked at 3.69 but is down to 1.9.  Will monitor closely   Acute on Chronic Hypoxic and Hypercapnic Respiratory Failure D/t Acute on Chronic HFpEF initially, now concern  for restrictive lung disease and multifocal pneumonia  supplemental O2 to keep sats >=90%, wean as tolerated BiPap as above palliative care involved DNR/DNI  continue supportive care with bronchodilators   Acute on Chronic HFpEF - improved Holding diuresis due to renal failure. Avoid aggressive IV fluids  BNP today unremarkable, monitor as needed    Anemia of Chronic Disease Monitor CBC Aranesp weekly    Hypokalemia monitor and  replete PRN   Hypernatremia : Sodium 148. Secondary to poor oral intake.  Patient noted to have very dry mucous membranes Gentle IV fluid hydration with D5W to administer free water Repeat sodium levels in a.m.                 Physical Exam: Vitals:   07/05/22 0501 07/05/22 0740 07/05/22 0749 07/05/22 1132  BP:  (!) 151/69  (!) 152/61  Pulse:  87  87  Resp:  16  16  Temp:  98.5 F (36.9 C)  97.9 F (36.6 C)  TempSrc:  Oral  Oral  SpO2:  97% 97% 98%  Weight: (!) 143.8 kg     Height:       Physical Exam Vitals and nursing note reviewed.  Constitutional:      Appearance: She is obese.     Comments: Very confused oriented to person.  Has mittens on.  Restless and continues to attempt to remove IV.  Sitter at the bedside.  HENT:     Head: Normocephalic and atraumatic.     Nose: Nose normal.     Mouth/Throat:     Mouth: Mucous membranes are dry.  Eyes:     Conjunctiva/sclera: Conjunctivae normal.  Cardiovascular:     Rate and Rhythm: Tachycardia present.  Pulmonary:     Effort: Pulmonary effort is normal.     Breath sounds: Normal breath sounds.  Abdominal:     General: Abdomen is flat.     Palpations: Abdomen is soft.     Comments: Central adiposity  Musculoskeletal:     Cervical back: Normal range of motion and neck supple.     Right lower leg: Edema present.     Left lower leg: Edema present.  Skin:    General: Skin is warm and dry.  Neurological:     Comments: Unable to assess  Psychiatric:     Comments: Unable to assess     Data Reviewed: Labs reviewed.  Magnesium 1.6, sodium 147, BUN 45, creatinine 1.90 white count 6.7, hemoglobin 9.1 There are no new results to review at this time.  Family Communication: Called and spoke to patient's son over the phone, [place]ah Buzzetta over the phone.  Discussed her current condition as well as the need for alternative means of providing nutrition.  He states that he had tried an NG tube when she was in ICU  but it was very uncomfortable for her.  Considering PEG tube but will discuss with his sister.  Disposition: Status is: Inpatient Remains inpatient appropriate because: Continues to have electrolyte abnormalities and no improvement in her mental status  Planned Discharge Destination: SNF when stable    Time spent: 40  minutes  Author: Oran Rein, MD 07/05/2022 1:36 PM  For on call review [email or web address].

## 2022-07-05 NOTE — Care Management Important Message (Signed)
Important Message  Patient Details  Name: Samantha Mason MRN: [id number] Date of Birth: 12-Jun-1950   Medicare Important Message Given:  Yes     [name] 07/05/2022, 12:02 PM

## 2022-07-05 NOTE — Progress Notes (Signed)
[facility]  ROUNDING NOTE   Subjective:   Samantha Mason  is a 72 year old female with past medical history including COPD with renal failure requiring 2 L oxygen as needed, chronic diastolic heart failure, sleep apnea, morbid obesity, hypertension, and chronic kidney disease stage IV. Patient presents to the emergency department feeling sick for a few days.  She was admitted for Sepsis [facility]) [A41.9] Cellulitis, unspecified cellulitis site [L03.90] Community acquired pneumonia, unspecified laterality [J18.9] Sepsis, due to unspecified organism, unspecified whether acute organ dysfunction present [facility]) [A41.9]  Patient is followed by our office outpatient. She was last seen in office on Apr 17, 2022.   Patient sitting up in bed Alert with delayed response Oriented to self No lower extremity edema   02/15 0701 - 02/16 0700 In: 360 [P.O.:360] Out: 600 [Urine:600] Lab Results  Component Value Date   CREATININE 1.29 (H) 07/05/2022   CREATININE 1.51 (H) 07/04/2022   CREATININE 1.90 (H) 07/03/2022      Objective:  Vital signs in last 24 hours:  Temp:  [97.6 F (36.4 C)-98.5 F (36.9 C)] 97.9 F (36.6 C) (02/16 1132) Pulse Rate:  [77-92] 87 (02/16 1132) Resp:  [16-21] 16 (02/16 1132) BP: (113-178)/(61-87) 152/61 (02/16 1132) SpO2:  [97 %-100 %] 98 % (02/16 1132) FiO2 (%):  [35 %] 35 % (02/15 1949) Weight:  [143.8 kg] 143.8 kg (02/16 0501)  Weight change: 3.5 kg Filed Weights   07/03/22 0338 07/04/22 0413 07/05/22 0501  Weight: (!) 143.5 kg (!) 140.3 kg (!) 143.8 kg    Intake/Output: I/O last 3 completed shifts: In: 360 [P.O.:360] Out: 1550 [Urine:1550]   Intake/Output this shift:  Total I/O In: 490 [P.O.:490] Out: -   Physical Exam: General: NAD  Head: Normocephalic, atraumatic.    Eyes: Anicteric  Lungs:  Clear to auscultation, Normal effort, [place] O2  Heart: Regular rate and rhythm  Abdomen:  Soft, nontender  Extremities: No peripheral edema.   Neurologic: Alert and oriented to self  Skin: No lesions  Access: No hemodialysis access    Basic Metabolic Panel: Recent Labs  Lab 07/01/22 0406 07/02/22 0531 07/03/22 0400 07/04/22 0406 07/05/22 0229  NA 142 144 147* 148* 148*  K 3.7 3.6 3.7 3.3* 3.7  CL 103 105 107 107 108  CO2 29 26 30 30 $ 32  GLUCOSE 94 81 92 109* 118*  BUN 53* 48* 45* 38* 32*  CREATININE 2.85* 2.41* 1.90* 1.51* 1.29*  CALCIUM 9.0 8.9 9.1 9.2 9.3  MG 2.0 1.7 1.6* 1.5* 1.9     Liver Function Tests: No results for input(s): "AST", "ALT", "ALKPHOS", "BILITOT", "PROT", "ALBUMIN" in the last 168 hours.  No results for input(s): "LIPASE", "AMYLASE" in the last 168 hours. No results for input(s): "AMMONIA" in the last 168 hours.   CBC: Recent Labs  Lab 06/30/22 0424 07/01/22 1047 07/02/22 0531 07/03/22 0400  WBC 6.5 6.2 6.0 6.7  HGB 8.7* 9.1* 8.4* 9.1*  HCT 30.0* 32.4* 29.7* 31.8*  MCV 84.3 86.4 85.8 85.7  PLT 268 294 250 261     Cardiac Enzymes: No results for input(s): "CKTOTAL", "CKMB", "CKMBINDEX", "TROPONINI" in the last 168 hours.  BNP: Invalid input(s): "POCBNP"  CBG: Recent Labs  Lab 07/01/22 0506  GLUCAP 91     Microbiology: Results for orders placed or performed during the hospital encounter of 06/08/22  Blood Culture (routine x 2)     Status: None   Collection Time: 06/08/22  7:18 PM   Specimen: BLOOD  Result Value Ref Range Status   Specimen Description BLOOD BLOOD LEFT ARM  Final   Special Requests   Final    BOTTLES DRAWN AEROBIC AND ANAEROBIC Blood Culture adequate volume   Culture   Final    NO GROWTH 5 DAYS Performed at [facility], [address] [place], [place] [address]    Report Status 06/13/2022 FINAL  Final  Blood Culture (routine x 2)     Status: None   Collection Time: 06/08/22  7:18 PM   Specimen: BLOOD  Result Value Ref Range Status   Specimen Description BLOOD BLOOD RIGHT ARM  Final   Special Requests   Final    BOTTLES DRAWN  AEROBIC AND ANAEROBIC Blood Culture adequate volume   Culture   Final    NO GROWTH 5 DAYS Performed at [facility], [address] [place], [place] [address]    Report Status 06/13/2022 FINAL  Final  Resp panel by RT-PCR (RSV, Flu A&B, Covid) Anterior Nasal Swab     Status: None   Collection Time: 06/08/22  7:18 PM   Specimen: Anterior Nasal Swab  Result Value Ref Range Status   SARS Coronavirus 2 by RT PCR NEGATIVE NEGATIVE Final    Comment: (NOTE) SARS-CoV-2 target nucleic acids are NOT DETECTED.  The SARS-CoV-2 RNA is generally detectable in upper respiratory specimens during the acute phase of infection. The lowest concentration of SARS-CoV-2 viral copies this assay can detect is 138 copies/mL. A negative result does not preclude SARS-Cov-2 infection and should not be used as the sole basis for treatment or other patient management decisions. A negative result may occur with  improper specimen collection/handling, submission of specimen other than nasopharyngeal swab, presence of viral mutation(s) within the areas targeted by this assay, and inadequate number of viral copies(<138 copies/mL). A negative result must be combined with clinical observations, patient history, and epidemiological information. The expected result is Negative.  Fact Sheet for Patients:  EntrepreneurPulse.com.au  Fact Sheet for Healthcare Providers:  IncredibleEmployment.be  This test is no t yet approved or cleared by the [place] FDA and  has been authorized for detection and/or diagnosis of SARS-CoV-2 by FDA under an Emergency Use Authorization (EUA). This EUA will remain  in effect (meaning this test can be used) for the duration of the COVID-19 declaration under Section 564(b)(1) of the Act, 21 U.S.C.section 360bbb-3(b)(1), unless the authorization is terminated  or revoked sooner.       Influenza A by PCR NEGATIVE NEGATIVE Final    Influenza B by PCR NEGATIVE NEGATIVE Final    Comment: (NOTE) The Xpert Xpress SARS-CoV-2/FLU/RSV plus assay is intended as an aid in the diagnosis of influenza from Nasopharyngeal swab specimens and should not be used as a sole basis for treatment. Nasal washings and aspirates are unacceptable for Xpert Xpress SARS-CoV-2/FLU/RSV testing.  Fact Sheet for Patients: EntrepreneurPulse.com.au  Fact Sheet for Healthcare Providers: IncredibleEmployment.be  This test is not yet approved or cleared by the [place] FDA and has been authorized for detection and/or diagnosis of SARS-CoV-2 by FDA under an Emergency Use Authorization (EUA). This EUA will remain in effect (meaning this test can be used) for the duration of the COVID-19 declaration under Section 564(b)(1) of the Act, 21 U.S.C. section 360bbb-3(b)(1), unless the authorization is terminated or revoked.     Resp Syncytial Virus by PCR NEGATIVE NEGATIVE Final    Comment: (NOTE) Fact Sheet for Patients: EntrepreneurPulse.com.au  Fact Sheet for Healthcare Providers: IncredibleEmployment.be  This test is not yet approved or cleared by the [place] and has been authorized for detection and/or diagnosis of SARS-CoV-2 by FDA under an Emergency Use Authorization (EUA). This EUA will remain in effect (meaning this test can be used) for the duration of the COVID-19 declaration under Section 564(b)(1) of the Act, 21 U.S.C. section 360bbb-3(b)(1), unless the authorization is terminated or revoked.  Performed at [facility], [address], [place], [place] [address]   Urine Culture     Status: None   Collection Time: 06/08/22  9:34 PM   Specimen: In/Out Cath Urine  Result Value Ref Range Status   Specimen Description   Final    IN/OUT CATH URINE Performed at [facility], [address], [place], [place] [address]    Special  Requests   Final    NONE Performed at [facility], [address], [place], [place] [address]    Culture   Final    NO GROWTH Performed at [facility], [place] [address], [place], [place] [address]    Report Status 06/10/2022 FINAL  Final  MRSA Next Gen by PCR, Nasal     Status: None   Collection Time: 06/10/22 11:49 PM   Specimen: Nasal Mucosa; Nasal Swab  Result Value Ref Range Status   MRSA by PCR Next Gen NOT DETECTED NOT DETECTED Final    Comment: (NOTE) The GeneXpert MRSA Assay (FDA approved for NASAL specimens only), is one component of a comprehensive MRSA colonization surveillance program. It is not intended to diagnose MRSA infection nor to guide or monitor treatment for MRSA infections. Test performance is not FDA approved in patients less than 68 years old. Performed at [facility], [place], [place] [address]   Respiratory (~20 pathogens) panel by PCR     Status: None   Collection Time: 06/12/22  6:15 AM   Specimen: Nasopharyngeal Swab; Respiratory  Result Value Ref Range Status   Adenovirus NOT DETECTED NOT DETECTED Final   Coronavirus 229E NOT DETECTED NOT DETECTED Final    Comment: (NOTE) The Coronavirus on the Respiratory Panel, DOES NOT test for the novel  Coronavirus (2019 nCoV)    Coronavirus HKU1 NOT DETECTED NOT DETECTED Final   Coronavirus NL63 NOT DETECTED NOT DETECTED Final   Coronavirus OC43 NOT DETECTED NOT DETECTED Final   Metapneumovirus NOT DETECTED NOT DETECTED Final   Rhinovirus / Enterovirus NOT DETECTED NOT DETECTED Final   Influenza A NOT DETECTED NOT DETECTED Final   Influenza B NOT DETECTED NOT DETECTED Final   Parainfluenza Virus 1 NOT DETECTED NOT DETECTED Final   Parainfluenza Virus 2 NOT DETECTED NOT DETECTED Final   Parainfluenza Virus 3 NOT DETECTED NOT DETECTED Final   Parainfluenza Virus 4 NOT DETECTED NOT DETECTED Final   Respiratory Syncytial Virus NOT DETECTED NOT DETECTED Final    Bordetella pertussis NOT DETECTED NOT DETECTED Final   Bordetella Parapertussis NOT DETECTED NOT DETECTED Final   Chlamydophila pneumoniae NOT DETECTED NOT DETECTED Final   Mycoplasma pneumoniae NOT DETECTED NOT DETECTED Final    Comment: Performed at [facility], [place] [place], [place] [address]  Culture, Respiratory w Gram Stain     Status: None   Collection Time: 06/17/22  8:15 AM   Specimen: Tracheal Aspirate; Respiratory  Result Value Ref Range Status   Specimen Description   Final    TRACHEAL ASPIRATE Performed at [facility], [place],  [place] [address]    Special Requests   Final    NONE Performed at [facility], [place], [place] [address]    Gram Stain   Final    RARE WBC PRESENT, PREDOMINANTLY PMN RARE SQUAMOUS EPITHELIAL CELLS PRESENT FEW YEAST Performed at [facility], [place] [address] [place], [place] [address]    Culture FEW CANDIDA ALBICANS  Final   Report Status 06/19/2022 FINAL  Final  Gastrointestinal Panel by PCR , Stool     Status: None   Collection Time: 06/27/22  4:34 PM   Specimen: Stool  Result Value Ref Range Status   Campylobacter species NOT DETECTED NOT DETECTED Final   Plesimonas shigelloides NOT DETECTED NOT DETECTED Final   Salmonella species NOT DETECTED NOT DETECTED Final   Yersinia enterocolitica NOT DETECTED NOT DETECTED Final   Vibrio species NOT DETECTED NOT DETECTED Final   Vibrio cholerae NOT DETECTED NOT DETECTED Final   Enteroaggregative E coli (EAEC) NOT DETECTED NOT DETECTED Final   Enteropathogenic E coli (EPEC) NOT DETECTED NOT DETECTED Final   Enterotoxigenic E coli (ETEC) NOT DETECTED NOT DETECTED Final   Shiga like toxin producing E coli (STEC) NOT DETECTED NOT DETECTED Final   Shigella/Enteroinvasive E coli (EIEC) NOT DETECTED NOT DETECTED Final   Cryptosporidium NOT DETECTED NOT DETECTED Final   Cyclospora cayetanensis NOT DETECTED NOT DETECTED Final    Entamoeba histolytica NOT DETECTED NOT DETECTED Final   Giardia lamblia NOT DETECTED NOT DETECTED Final   Adenovirus F40/41 NOT DETECTED NOT DETECTED Final   Astrovirus NOT DETECTED NOT DETECTED Final   Norovirus GI/GII NOT DETECTED NOT DETECTED Final   Rotavirus A NOT DETECTED NOT DETECTED Final   Sapovirus (I, II, IV, and V) NOT DETECTED NOT DETECTED Final    Comment: Performed at [facility], [address] [place], [place] [address]  Urine Culture (for pregnant, neutropenic or urologic patients or patients with an indwelling urinary catheter)     Status: Abnormal   Collection Time: 07/01/22 10:00 AM   Specimen: Urine, Catheterized  Result Value Ref Range Status   Specimen Description   Final    URINE, CATHETERIZED Performed at [facility], [address], [place], [place] [address]    Special Requests   Final    NONE Performed at [facility], [address] [place], [place] [address]    Culture >=100,000 COLONIES/mL YEAST (A)  Final   Report Status 07/02/2022 FINAL  Final  Culture, blood (Routine X 2) w Reflex to ID Panel     Status: Abnormal   Collection Time: 07/01/22 10:47 AM   Specimen: BLOOD RIGHT ARM  Result Value Ref Range Status   Specimen Description   Final    BLOOD RIGHT ARM Performed at [facility], [address] [place], [place] [address]    Special Requests   Final    BOTTLES DRAWN AEROBIC AND ANAEROBIC Blood Culture adequate volume Performed at [facility], [place] [address] [place],  [address]    Culture  Setup Time   Final    GRAM POSITIVE COCCI AEROBIC BOTTLE ONLY Organism ID to follow CRITICAL RESULT CALLED TO, READ BACK BY AND VERIFIED WITHPamelia Hoit PHARMD 1130 07/02/22 HNM Performed at [facility], [address] [place], [place] [address]    Culture (A)  Final    STAPHYLOCOCCUS EPIDERMIDIS THE SIGNIFICANCE OF ISOLATING THIS ORGANISM FROM A SINGLE SET OF BLOOD  CULTURES WHEN MULTIPLE  SETS ARE DRAWN IS UNCERTAIN. PLEASE NOTIFY THE MICROBIOLOGY DEPARTMENT WITHIN ONE WEEK IF SPECIATION AND SENSITIVITIES ARE REQUIRED. Performed at [facility], [place] [address] [place], [place] [address]    Report Status 07/04/2022 FINAL  Final  Culture, blood (Routine X 2) w Reflex to ID Panel     Status: None (Preliminary result)   Collection Time: 07/01/22 10:47 AM   Specimen: BLOOD LEFT ARM  Result Value Ref Range Status   Specimen Description BLOOD LEFT ARM  Final   Special Requests   Final    BOTTLES DRAWN AEROBIC AND ANAEROBIC Blood Culture adequate volume   Culture   Final    NO GROWTH 4 DAYS Performed at [facility], [address] [place], [place] [address]    Report Status PENDING  Incomplete  Blood Culture ID Panel (Reflexed)     Status: Abnormal   Collection Time: 07/01/22 10:47 AM  Result Value Ref Range Status   Enterococcus faecalis NOT DETECTED NOT DETECTED Final   Enterococcus Faecium NOT DETECTED NOT DETECTED Final   Listeria monocytogenes NOT DETECTED NOT DETECTED Final   Staphylococcus species DETECTED (A) NOT DETECTED Final    Comment: CRITICAL RESULT CALLED TO, READ BACK BY AND VERIFIED WITH: Pamelia Hoit PHARMD 1130 07/02/22 HNM    Staphylococcus aureus (BCID) NOT DETECTED NOT DETECTED Final   Staphylococcus epidermidis DETECTED (A) NOT DETECTED Final    Comment: Methicillin (oxacillin) resistant coagulase negative staphylococcus. Possible blood culture contaminant (unless isolated from more than one blood culture draw or clinical case suggests pathogenicity). No antibiotic treatment is indicated for blood  culture contaminants. CRITICAL RESULT CALLED TO, READ BACK BY AND VERIFIED WITH: Pamelia Hoit PHARMD 1130 07/02/22 HNM    Staphylococcus lugdunensis NOT DETECTED NOT DETECTED Final   Streptococcus species NOT DETECTED NOT DETECTED Final   Streptococcus agalactiae NOT DETECTED NOT DETECTED Final   Streptococcus  pneumoniae NOT DETECTED NOT DETECTED Final   Streptococcus pyogenes NOT DETECTED NOT DETECTED Final   A.calcoaceticus-baumannii NOT DETECTED NOT DETECTED Final   Bacteroides fragilis NOT DETECTED NOT DETECTED Final   Enterobacterales NOT DETECTED NOT DETECTED Final   Enterobacter cloacae complex NOT DETECTED NOT DETECTED Final   Escherichia coli NOT DETECTED NOT DETECTED Final   Klebsiella aerogenes NOT DETECTED NOT DETECTED Final   Klebsiella oxytoca NOT DETECTED NOT DETECTED Final   Klebsiella pneumoniae NOT DETECTED NOT DETECTED Final   Proteus species NOT DETECTED NOT DETECTED Final   Salmonella species NOT DETECTED NOT DETECTED Final   Serratia marcescens NOT DETECTED NOT DETECTED Final   Haemophilus influenzae NOT DETECTED NOT DETECTED Final   Neisseria meningitidis NOT DETECTED NOT DETECTED Final   Pseudomonas aeruginosa NOT DETECTED NOT DETECTED Final   Stenotrophomonas maltophilia NOT DETECTED NOT DETECTED Final   Candida albicans NOT DETECTED NOT DETECTED Final   Candida auris NOT DETECTED NOT DETECTED Final   Candida glabrata NOT DETECTED NOT DETECTED Final   Candida krusei NOT DETECTED NOT DETECTED Final   Candida parapsilosis NOT DETECTED NOT DETECTED Final   Candida tropicalis NOT DETECTED NOT DETECTED Final   Cryptococcus neoformans/gattii NOT DETECTED NOT DETECTED Final   Methicillin resistance mecA/C DETECTED (A) NOT DETECTED Final    Comment: CRITICAL RESULT CALLED TO, READ BACK BY AND VERIFIED WITHPamelia Hoit PHARMD 1130 07/02/22 HNM Performed at [facility], [address] [place], [place] [address]     Coagulation Studies: No results for input(s): "LABPROT", "INR" in the last 72 hours.  Urinalysis:  Recent Labs    07/05/22 0907  COLORURINE YELLOW*  LABSPEC 1.015  PHURINE 5.0  GLUCOSEU NEGATIVE  HGBUR LARGE*  BILIRUBINUR NEGATIVE  KETONESUR NEGATIVE  PROTEINUR 30*  NITRITE NEGATIVE  LEUKOCYTESUR LARGE*       Imaging: No results  found.   Medications:    ferric gluconate (FERRLECIT) IVPB Stopped (06/28/22 1646)     budesonide (PULMICORT) nebulizer solution  0.5 mg Nebulization BID   darbepoetin (ARANESP) injection - NON-DIALYSIS  60 mcg Subcutaneous Q Tue-1800   diltiazem  240 mg Oral Daily   enoxaparin (LOVENOX) injection  40 mg Subcutaneous Q12H   feeding supplement  237 mL Oral TID BM   fluconazole  200 mg Oral Daily   fluocinonide-emollient   Topical QODAY   Gerhardt's butt cream   Topical TID   heparin  5,000 Units Subcutaneous Q8H   melatonin  5 mg Oral QHS   multivitamin with minerals  1 tablet Oral Daily   pantoprazole  40 mg Oral Daily   potassium chloride  40 mEq Oral BID   acetaminophen, diphenoxylate-atropine, docusate sodium, ipratropium-albuterol, lip balm, ondansetron **OR** ondansetron (ZOFRAN) IV, mouth rinse, mouth rinse, phenol  Assessment/ Plan:  Samantha Mason is a 72 y.o.  female  with past medical history including COPD with renal failure requiring 2 L oxygen as needed, chronic diastolic heart failure, sleep apnea, morbid obesity, hypertension, and chronic kidney disease stage IV. Patient presents to the emergency department feeling sick and was admitted for Sepsis [facility]) [A41.9] Cellulitis, unspecified cellulitis site [L03.90] Community acquired pneumonia, unspecified laterality [J18.9] Sepsis, due to unspecified organism, unspecified whether acute organ dysfunction present [facility]) [A41.9]   Acute Kidney Injury on chronic kidney disease stage IIIb with baseline creatinine 1.55 and GFR of 36 on 04/17/22.  Acute kidney injury secondary to severe infection. No IV contrast exposure.  Diuretics remain held. Lower extremity edema present, however patient has history of lymphedema.   Renal function remains stable, below stated baseline. No acute indication for dialysis.    Lab Results  Component Value Date   CREATININE 1.29 (H) 07/05/2022   CREATININE 1.51 (H) 07/04/2022    CREATININE 1.90 (H) 07/03/2022    Intake/Output Summary (Last 24 hours) at 07/05/2022 1206 Last data filed at 07/05/2022 1030 Gross per 24 hour  Intake 850 ml  Output 600 ml  Net 250 ml    2. Anemia of chronic kidney disease Lab Results  Component Value Date   HGB 9.1 (L) 07/03/2022  Hemoglobin acceptable at 9.1.  Will continue weekly Aranesp.   3.  Chronic diastolic heart failure.  Echo from 07/16/21 shows EF 55 to 60% with mild LVH.  Diuretics held.  4.  Acute respiratory failure.  Nasal cannula, Bipap at night.  Due to renal stability, we will sign off at this time. Feel free to recosult if needed.    LOS: [place] 2/16/202412:06 PM

## 2022-07-05 NOTE — TOC Progression Note (Signed)
Transition of Care [facility]) - Progression Note    Patient Details  Name: Samantha Mason MRN: [id number] Date of Birth: [birth year]  Transition of Care [facility]) CM/SW [place], [place] Phone Number: 07/05/2022, 10:06 AM  Clinical Narrative:     TOC continues to follow for medical readiness to dc to [facility], Ricky at [employer] updated.   Expected Discharge Plan: [place] Barriers to Discharge: Continued Medical Work up  Expected Discharge Plan and Services   Discharge Planning Services: CM Consult Post Acute Care Choice: [place] Living arrangements for the past 2 months: Single Family Home                 DME Arranged: N/A         HH Arranged: NA           Social Determinants of Health (SDOH) Interventions SDOH Screenings   Food Insecurity: No Food Insecurity (06/09/2022)  Housing: Low Risk  (06/09/2022)  Transportation Needs: No Transportation Needs (06/09/2022)  Utilities: Not At Risk (06/09/2022)  Alcohol Screen: Low Risk  (08/08/2021)  Depression (PHQ2-9): Medium Risk (02/25/2022)  Financial Resource Strain: Medium Risk (08/14/2021)  Physical Activity: Inactive (08/08/2021)  Social Connections: Socially Isolated (08/08/2021)  Stress: No Stress Concern Present (08/08/2021)  Tobacco Use: Low Risk  (06/11/2022)    Readmission Risk Interventions     No data to display

## 2022-07-06 LAB — BASIC METABOLIC PANEL
Anion gap: 9 (ref 5–15)
BUN: 26 mg/dL — ABNORMAL HIGH (ref 8–23)
CO2: 30 mmol/L (ref 22–32)
Calcium: 9 mg/dL (ref 8.9–10.3)
Chloride: 102 mmol/L (ref 98–111)
Creatinine, Ser: 1.17 mg/dL — ABNORMAL HIGH (ref 0.44–1.00)
GFR, Estimated: 50 mL/min — ABNORMAL LOW (ref >60)
Glucose, Bld: 106 mg/dL — ABNORMAL HIGH (ref 70–99)
Potassium: 3.8 mmol/L (ref 3.5–5.1)
Sodium: 141 mmol/L (ref 135–145)

## 2022-07-06 LAB — URINE CULTURE: Culture: NO GROWTH

## 2022-07-06 LAB — BLOOD GAS, ARTERIAL
Acid-Base Excess: 5.4 mmol/L — ABNORMAL HIGH (ref 0.0–2.0)
Bicarbonate: 35.5 mmol/L — ABNORMAL HIGH (ref 20.0–28.0)
O2 Content: 3 L/min
O2 Saturation: 100 %
Patient temperature: 37
pCO2 arterial: 81 mmHg (ref 32–48)
pH, Arterial: 7.25 — ABNORMAL LOW (ref 7.35–7.45)
pO2, Arterial: 129 mmHg — ABNORMAL HIGH (ref 83–108)

## 2022-07-06 LAB — MAGNESIUM: Magnesium: 1.6 mg/dL — ABNORMAL LOW (ref 1.7–2.4)

## 2022-07-06 LAB — CULTURE, BLOOD (ROUTINE X 2)
Culture: NO GROWTH
Special Requests: ADEQUATE

## 2022-07-06 MED ORDER — MAGNESIUM SULFATE 2 GM/50ML IV SOLN
2.0000 g | Freq: Once | INTRAVENOUS | Status: AC
  Administered 2022-07-06: 2 g via INTRAVENOUS
  Filled 2022-07-06: qty 50

## 2022-07-06 NOTE — Progress Notes (Signed)
Dressing changed to bilateral lower extremities.

## 2022-07-06 NOTE — Progress Notes (Signed)
Occupational Therapy Treatment Patient Details Name: Samantha Mason MRN: [id number] DOB: 05-02-1951 Today's Date: 07/06/2022   History of present illness 72 y/o female with h/o morbid obesity, OSA, HTN, COPD, CHF, CKD IV, GERD, IBS, RLS and recent admission for SBO (medically managed) who is admitted with CHF, sepsis, UTI, AKI and cellulitis. Pt extubated on 1/31.   OT comments  Patient received semi-reclined in bed with son present. Both agreeable to OT. Pt reporting bed soiled 2/2 purewick leaking. NT present to assist with linen change. Pt required Mod-Max for rolling L<>R, Max A for posterior hygiene, and Min A for UB dressing. Pt on 3L O2 t/o session. She c/o SOB with rolling and required VC for pursed lip breathing. SpO2 maintained >90% with activity. Pt appeared slightly anxious with HOB flat. Pt with improved alertness/arousal and ability to follow single step commands this date. Overall flat affect. Pt left as received with all needs reach. Pt is making progress toward goal completion. D/C recommendation remains appropriate. OT will continue to follow acutely.    Recommendations for follow up therapy are one component of a multi-disciplinary discharge planning process, led by the attending physician.  Recommendations may be updated based on patient status, additional functional criteria and insurance authorization.    Follow Up Recommendations  Skilled nursing-short term rehab (<3 hours/day)     Assistance Recommended at Discharge Frequent or constant Supervision/Assistance  Patient can return home with the following  A lot of help with walking and/or transfers;A lot of help with bathing/dressing/bathroom;Assistance with cooking/housework;Assistance with feeding;Direct supervision/assist for medications management;Help with stairs or ramp for entrance;Assist for transportation   Equipment Recommendations  Other (comment) (per next venue of care)    Recommendations for Other  Services      Precautions / Restrictions Precautions Precautions: Fall Restrictions Weight Bearing Restrictions: No       Mobility Bed Mobility Overal bed mobility: Needs Assistance Bed Mobility: Rolling Rolling: Mod assist, Max assist         General bed mobility comments: Mod-Max A for rolling L<>R, able to maintain position using bed rails and Min guard. Able to assist with scooting self up toward [facility] using bed features and rails, VC required for technique (hand placement, bending knees & pushing BLEs into bed).    Transfers         Balance       Sitting balance - Comments: pt deferred             ADL either performed or assessed with clinical judgement   ADL Overall ADL's : Needs assistance/impaired                 Upper Body Dressing : Minimal assistance;Bed level Upper Body Dressing Details (indicate cue type and reason): to don/doff gown Lower Body Dressing: Maximal assistance;Bed level       Toileting- Clothing Manipulation and Hygiene: Maximal assistance;Bed level Toileting - Clothing Manipulation Details (indicate cue type and reason): for posterior hygiene       General ADL Comments: Bed soiled 2/2 purewick leaking, NT present to assist with linen change.    Extremity/Trunk Assessment Upper Extremity Assessment Upper Extremity Assessment: Generalized weakness   Lower Extremity Assessment Lower Extremity Assessment: Generalized weakness        Vision Patient Visual Report: No change from baseline     Perception     Praxis      Cognition Arousal/Alertness: Awake/alert Behavior During Therapy: Flat affect Overall Cognitive Status: Impaired/Different from baseline  General Comments: Improved alertness/arousal this date, A&Ox2 (oriented to self & location), able to identify son in room, overall flat affect. Followed single step commands well.        Exercises      Shoulder Instructions       General  Comments Pt on 3L O2 t/o session. She c/o SOB with rolling and required VC for pursed lip breathing. SpO2 maintained >90%. Pt appeared slightly anxious with HOB flat.    Pertinent Vitals/ Pain       Pain Assessment Pain Assessment: Faces Faces Pain Scale: Hurts a little bit Pain Location: bottom when completing peri care Pain Descriptors / Indicators: Discomfort, Grimacing, Sore Pain Intervention(s): Limited activity within patient's tolerance, Monitored during session, Repositioned  Home Living             Prior Functioning/Environment              Frequency  Min 2X/week        Progress Toward Goals  OT Goals(current goals can now be found in the care plan section)  Progress towards OT goals: Progressing toward goals  Acute Rehab OT Goals Patient Stated Goal: return home OT Goal Formulation: With patient/family Time For Goal Achievement: 07/17/22 Potential to Achieve Goals: [place] Discharge plan remains appropriate;Frequency remains appropriate    Co-evaluation                 AM-PAC OT "6 Clicks" Daily Activity     Outcome Measure   Help from another person eating meals?: A Little Help from another person taking care of personal grooming?: A Little Help from another person toileting, which includes using toliet, bedpan, or urinal?: A Lot Help from another person bathing (including washing, rinsing, drying)?: A Lot Help from another person to put on and taking off regular upper body clothing?: A Little Help from another person to put on and taking off regular lower body clothing?: A Lot 6 Click Score: 15    End of Session Equipment Utilized During Treatment: Oxygen  OT Visit Diagnosis: Muscle weakness (generalized) (M62.81);Unsteadiness on feet (R26.81)   Activity Tolerance Patient tolerated treatment well;Patient limited by fatigue   Patient Left in bed;with call bell/phone within reach;with bed alarm set;with family/visitor present   Nurse  Communication Mobility status        Time: [id number] OT Time Calculation (min): 13 min  Charges: OT General Charges $OT Visit: 1 Visit OT Treatments $Self Care/Home Management : 8-22 mins  [facility] MS, OTR/L ascom [phone]  07/06/22, 6:32 PM

## 2022-07-06 NOTE — Progress Notes (Signed)
... Progress Note   Patient: Samantha Mason [id number] DOB: 1950/06/04 DOA: 06/08/2022     28 DOS: the patient was seen and examined on 07/06/2022   Brief hospital course:  72 y.o. female with medical history significant for Morbid obesity, chronic HFpEF,CKD stage III, chronic lower extremity lymphedema on treatment with torsemide, hypertension, asthma, stroke (1995) and obstructive sleep apnea (non compliant with cpap) to ED w/ CC fatigue, LE swelling/redness. Pt was admitted. Course complicated by CO2 narcosis, failed BiPAP requiring intubation and mechanical ventilation.  Transferred to hospitalist service on 06/24/22. Treated for Acute on Chronic HFpEF. Her respiratory failure has been treated with BiPAP, diuresis, and bronchodilators. She has been refusing BiPAP now. Currently on 2L [place]. Diuresis held, Cr worsens despite this but has stabilized, nephrology following.    1/20: Presented to ED, admitted by [facility] with Sepsis in setting of lower extremity Cellulitis, AKI, and Acute on Chronic Hypercapnic Respiratory Failure in setting AECOPD.  1/21: Added cefepime, continue vancomycin, increase dose of Cardizem for better heart rate control 1/22: Had to reduce Cardizem as blood pressure dropped 1/23: Transferred to ICU overnight due to CO2 narcosis, placed on BiPAP.  PCCM consulted.  Worsening AKI, holding diuresis.  Agitation requiring Precedex.  Palliative Care consulted for [place].  ABX broadened back to Cefepime and Vancomycin 1/24: Pt on Bipap but awake and able to follow commands. Attempt to transition off Bipap.  1/25: Pt currently on Bipap and precedex gtt.  Required 2.5 mg iv valium overnight for delirium.  Unable to obtain CT Head due to respiratory status.  Ultimately required intubation. 1/26: Remains critically ill. Issues with vent dyssynchrony and ventilation, plan to start Nimbex gtt. Creatinine improved with diuresis, continue with diuresis, Nephrology considering Lasix gtt. 1/27:  ABG's improved on Nimbex, persistent cuff leak, exchanged ETT. Decadron started due to upper airway swelling. Will attempt to d/c Nimbex. Good diuresis with Lasix gtt with 4.5 L of UOP last 24 hrs, Creatinine improving. 1/28: Continues with good diuresis on Lasix gtt  1/29: No acute events overnight. Remained mechanically intubated with minimal vent settings.  Remains on lasix gtt.  Not on sedation and attempting to follow commands but extremely weak.   1/30: No acute events overnight.  Pt awake and following commands on precedex gtt.  Lasix gtt. Tolerating SBT 15/8 1/31: No acute events, awake and following commands on low dose precedex.  Remains on Lasix gtt,  Creatinine remains stable BUN slowly uptrending. Tolerating SBT 15/8 ~ will continue to wean PSV as tolerated ~ EXTUBATED. Lasix gtt d/c following extubation.  Following extubation, pt confirmed DNR/DNI status in presence of her son at bedside. 2/1: Tolerated BiPAP overnight, weaned to [facility] this am.  Speech evaluation performed, which pt FAILED. Consult PT/OT.  Creatinine continues to improve, started on D5W @ 50 ml/hr due to Hypernatremia of 155.  Holding diuresis today 2/2: Pt febrile on Bipap @50$ %.  Pt awake and following commands.  Failed speech evaluation on 02/1 and declining NGT placement.  2/2: Venous US BLE: No DVT 2/3: Agreeable to small bore NGT placement and dialysis if needed.  Again confirmed DNR/DNI  2/4: Nursing staff reporting pt now tolerating ice chips without signs of aspiration. NG was not needed.  2/5: back to hospitalist service. Creatinine uptrending.  2/6-2/09: Cr not improving but has been stable -  nephrology following, pt agreeable to temporary HD if needed, nephro recs continue to monitor UOP. No acute need for dialysis. Continue to hold diuretics. Liquid stool persist,  GI PCR negative, KUB no concerns.   02/10-02/11: Cr starting to improve some.  02/11 overnight insomnia, given Rx and sleep around 04:30, was a bit  more confused on AM rounds likely med effect + hypercarbia as she has been refusing BiPap - found hypercarbic on ABG and placed back on BiPap and transferred to Progressive unit. UA also concerning for UTI, CXR concerning for PNA. (+)Sepsis criteria  02/12: Cr thankfully improving. Still confused. Remains on BiPap. See IPAL note for discussion w/ son.  02/13: 1/4 BCx(+) likely contaminant. Urine growing yeast. BNP and Procal, neither remarkably elevated, will not administer diuresis but will repeat CXR in AM and d/c abx other than diflucan for yeast in urine. Remains hypercarbic and on BiPap, more alert today but still trying to pull off mask and requiring frequent redirection/sitter.  02/14: Very confused.  Has mittens on.  Continues to attempt to remove IV and had remove the BiPAP mask overnight.  Renal function continues to improve and had peaked at 3.69 it is down to 1.9 today. 2/15 : Patient seen and examined this morning.  Continues to stay confused.  Was on BiPAP overnight.  No acute overnight event reported by nursing.  Labs with hypokalemia and hypomagnesemia repleted..  Continues to stay on 2 L nasal cannula in the day.  Has completed treatment for pneumonia along threshold for reinitiating antibiotics if spikes fever. 2/16 : Patient continues to stay lethargic.  On nasal cannula 2 to 3 L.  Renal functions improving but worsening hypernatremia 148.  Patient started on D5 at 77.  Still deconditioned 2/17 patient seen and examined this morning improvement in mental status compared to yesterday.  However still lethargic.  Hyponatremia improving with sodium of 141 today continue with the patient on D5 at 75.  Magnesium repleted.  Diflucan continued for UTI/yeast infection.  As patient is lethargic ordered ABG      Assessment and Plan:  Principal Problem:   Severe sepsis ([place]) Active Problems:   Acute on chronic respiratory failure with hypoxia and hypercapnia (HCC)   Chronic diastolic CHF  (congestive heart failure) (HCC)   Essential hypertension   Acute renal failure superimposed on stage 3b chronic kidney disease (HCC)   Morbid obesity with BMI of 50.0-59.9, adult (HCC)   OSA    History of stroke   Extreme obesity with alveolar hypoventilation (HCC)   Cellulitis   Acute metabolic encephalopathy   Community acquired pneumonia   Toxic Metabolic Encephalopathy likely CO2 narcosis - resolved then recurred Also concern for multifocal pneumonia and UTI - see below Continue BiPap ABG done today is compensated with no evidence of hypercapnia Oral intake remains very poor and patient appears dehydrated with hypernatremia. Judicious IV fluid administration may need alternative means of providing nutrition   Sepsis/SIRS w/ concern for Multifocal Pneumonia per CXR 02/11 Procalcitonin unremarkable  BNP unremarkable  Cefepime + vancomycin for multifocal pneumonia acquired in hospital setting --> d/c 02/13 but low threshold to restart if febrile or increased SOB Repeat CXR in AM BCx likely contaminant  Sepsis d/t UTI per UA 02/11 UCx (+)yeast  DIflucan per pharmacy  Limit IV fluids d/t hx HFpEF but BNP no concerns, monitor fluid status closely   Acute on Chronic Kidney Injury likely secondary to severe infection/sepsis on admission, was improving then worse again, concern for volume contraction / diuresis effect - improving now Hypernatremia - resolved Contraction Alkalosis - resolved  holding diuretics, holding IVF management per nephrology - there was some concern about  possible needing dialysis but she has been stable for past few days  Appreciate nephrology input Serum creatinine peaked at 3.69 but is down to 1.9.  Will monitor closely   Acute on Chronic Hypoxic and Hypercapnic Respiratory Failure D/t Acute on Chronic HFpEF initially, now concern for restrictive lung disease and multifocal pneumonia  supplemental O2 to keep sats >=90%, wean as tolerated BiPap as  above palliative care involved DNR/DNI  continue supportive care with bronchodilators   Acute on Chronic HFpEF - improved Holding diuresis due to renal failure. Avoid aggressive IV fluids  BNP today unremarkable, monitor as needed    Anemia of Chronic Disease Monitor CBC Aranesp weekly    Hypokalemia monitor and replete PRN   Hypernatremia : Sodium 148. Secondary to poor oral intake.  Patient noted to have very dry mucous membranes Gentle IV fluid hydration with D5W to administer free water Repeat sodium levels in a.m.                 Physical Exam: Vitals:   07/06/22 0003 07/06/22 0428 07/06/22 0733 07/06/22 1149  BP: 126/65 (!) 112/52 (!) 121/56 (!) 134/54  Pulse: 78 67 73 72  Resp: 20 18 19 17  $ Temp: 98.4 F (36.9 C) 98.3 F (36.8 C) 98 F (36.7 C) (!) 97.5 F (36.4 C)  TempSrc: Axillary Oral  Oral  SpO2: 96% 100% 100% 99%  Weight:  (!) 143.7 kg    Height:       Physical Exam Vitals and nursing note reviewed.  Constitutional:      Appearance: She is obese.     Comments: Very confused oriented to person.  Has mittens on.  Restless and continues to attempt to remove IV.  Sitter at the bedside.  HENT:     Head: Normocephalic and atraumatic.     Nose: Nose normal.     Mouth/Throat:     Mouth: Mucous membranes are dry.  Eyes:     Conjunctiva/sclera: Conjunctivae normal.  Cardiovascular:     Rate and Rhythm: Tachycardia present.  Pulmonary:     Effort: Pulmonary effort is normal.     Breath sounds: Normal breath sounds.  Abdominal:     General: Abdomen is flat.     Palpations: Abdomen is soft.     Comments: Central adiposity  Musculoskeletal:     Cervical back: Normal range of motion and neck supple.     Right lower leg: Edema present.     Left lower leg: Edema present.  Skin:    General: Skin is warm and dry.  Neurological:     Comments: Unable to assess  Psychiatric:     Comments: Unable to assess     Data Reviewed: Labs reviewed.   Magnesium 1.6, sodium 147, BUN 45, creatinine 1.90 white count 6.7, hemoglobin 9.1 There are no new results to review at this time.  Family Communication: Called and spoke to patient's son over the phone, Sherlita Menchaca over the phone.  Discussed her current condition as well as the need for alternative means of providing nutrition.  He states that he had tried an NG tube when she was in ICU but it was very uncomfortable for her.  Considering PEG tube but will discuss with his sister.  Disposition: Status is: Inpatient Remains inpatient appropriate because: Continues to have electrolyte abnormalities and no improvement in her mental status  Planned Discharge Destination: SNF when stable    Time spent: 40  minutes  Author: Oran Rein, MD  07/06/2022 2:42 PM  For on call review [email or web address].

## 2022-07-07 LAB — CBC
HCT: 32.6 % — ABNORMAL LOW (ref 36.0–46.0)
Hemoglobin: 9.1 g/dL — ABNORMAL LOW (ref 12.0–15.0)
MCH: 24.5 pg — ABNORMAL LOW (ref 26.0–34.0)
MCHC: 27.9 g/dL — ABNORMAL LOW (ref 30.0–36.0)
MCV: 87.9 fL (ref 80.0–100.0)
Platelets: 244 10*3/uL (ref 150–400)
RBC: 3.71 MIL/uL — ABNORMAL LOW (ref 3.87–5.11)
RDW: 18.6 % — ABNORMAL HIGH (ref 11.5–15.5)
WBC: 5.1 10*3/uL (ref 4.0–10.5)
nRBC: 0.4 % — ABNORMAL HIGH (ref 0.0–0.2)

## 2022-07-07 LAB — BASIC METABOLIC PANEL
Anion gap: 7 (ref 5–15)
BUN: 25 mg/dL — ABNORMAL HIGH (ref 8–23)
CO2: 30 mmol/L (ref 22–32)
Calcium: 9.3 mg/dL (ref 8.9–10.3)
Chloride: 99 mmol/L (ref 98–111)
Creatinine, Ser: 1.28 mg/dL — ABNORMAL HIGH (ref 0.44–1.00)
GFR, Estimated: 45 mL/min — ABNORMAL LOW (ref >60)
Glucose, Bld: 112 mg/dL — ABNORMAL HIGH (ref 70–99)
Potassium: 4.1 mmol/L (ref 3.5–5.1)
Sodium: 136 mmol/L (ref 135–145)

## 2022-07-07 LAB — MAGNESIUM: Magnesium: 1.9 mg/dL (ref 1.7–2.4)

## 2022-07-07 NOTE — Progress Notes (Signed)
Marland Kitchen... Progress Note   Patient: Samantha Mason [id number] DOB: 14-Nov-1950 DOA: 06/08/2022     29 DOS: the patient was seen and examined on 07/07/2022   Brief hospital course:  72 y.o. female with medical history significant for Morbid obesity, chronic HFpEF,CKD stage III, chronic lower extremity lymphedema on treatment with torsemide, hypertension, asthma, stroke (1995) and obstructive sleep apnea (non compliant with cpap) to ED w/ CC fatigue, LE swelling/redness. Pt was admitted. Course complicated by CO2 narcosis, failed BiPAP requiring intubation and mechanical ventilation.  Transferred to hospitalist service on 06/24/22. Treated for Acute on Chronic HFpEF. Her respiratory failure has been treated with BiPAP, diuresis, and bronchodilators. She has been refusing BiPAP now. Currently on 2L [place]. Diuresis held, Cr worsens despite this but has stabilized, nephrology following.    1/20: Presented to ED, admitted by [facility] with Sepsis in setting of lower extremity Cellulitis, AKI, and Acute on Chronic Hypercapnic Respiratory Failure in setting AECOPD.  1/21: Added cefepime, continue vancomycin, increase dose of Cardizem for better heart rate control 1/22: Had to reduce Cardizem as blood pressure dropped 1/23: Transferred to ICU overnight due to CO2 narcosis, placed on BiPAP.  PCCM consulted.  Worsening AKI, holding diuresis.  Agitation requiring Precedex.  Palliative Care consulted for [place].  ABX broadened back to Cefepime and Vancomycin 1/24: Pt on Bipap but awake and able to follow commands. Attempt to transition off Bipap.  1/25: Pt currently on Bipap and precedex gtt.  Required 2.5 mg iv valium overnight for delirium.  Unable to obtain CT Head due to respiratory status.  Ultimately required intubation. 1/26: Remains critically ill. Issues with vent dyssynchrony and ventilation, plan to start Nimbex gtt. Creatinine improved with diuresis, continue with diuresis, Nephrology considering Lasix gtt. 1/27:  ABG's improved on Nimbex, persistent cuff leak, exchanged ETT. Decadron started due to upper airway swelling. Will attempt to d/c Nimbex. Good diuresis with Lasix gtt with 4.5 L of UOP last 24 hrs, Creatinine improving. 1/28: Continues with good diuresis on Lasix gtt  1/29: No acute events overnight. Remained mechanically intubated with minimal vent settings.  Remains on lasix gtt.  Not on sedation and attempting to follow commands but extremely weak.   1/30: No acute events overnight.  Pt awake and following commands on precedex gtt.  Lasix gtt. Tolerating SBT 15/8 1/31: No acute events, awake and following commands on low dose precedex.  Remains on Lasix gtt,  Creatinine remains stable BUN slowly uptrending. Tolerating SBT 15/8 ~ will continue to wean PSV as tolerated ~ EXTUBATED. Lasix gtt d/c following extubation.  Following extubation, pt confirmed DNR/DNI status in presence of her son at bedside. 2/1: Tolerated BiPAP overnight, weaned to [facility] this am.  Speech evaluation performed, which pt FAILED. Consult PT/OT.  Creatinine continues to improve, started on D5W @ 50 ml/hr due to Hypernatremia of 155.  Holding diuresis today 2/2: Pt febrile on Bipap @50$ %.  Pt awake and following commands.  Failed speech evaluation on 02/1 and declining NGT placement.  2/2: Venous US BLE: No DVT 2/3: Agreeable to small bore NGT placement and dialysis if needed.  Again confirmed DNR/DNI  2/4: Nursing staff reporting pt now tolerating ice chips without signs of aspiration. NG was not needed.  2/5: back to hospitalist service. Creatinine uptrending.  2/6-2/09: Cr not improving but has been stable -  nephrology following, pt agreeable to temporary HD if needed, nephro recs continue to monitor UOP. No acute need for dialysis. Continue to hold diuretics. Liquid stool persist,  GI PCR negative, KUB no concerns.   02/10-02/11: Cr starting to improve some.  02/11 overnight insomnia, given Rx and sleep around 04:30, was a bit  more confused on AM rounds likely med effect + hypercarbia as she has been refusing BiPap - found hypercarbic on ABG and placed back on BiPap and transferred to Progressive unit. UA also concerning for UTI, CXR concerning for PNA. (+)Sepsis criteria  02/12: Cr thankfully improving. Still confused. Remains on BiPap. See IPAL note for discussion w/ son.  02/13: 1/4 BCx(+) likely contaminant. Urine growing yeast. BNP and Procal, neither remarkably elevated, will not administer diuresis but will repeat CXR in AM and d/c abx other than diflucan for yeast in urine. Remains hypercarbic and on BiPap, more alert today but still trying to pull off mask and requiring frequent redirection/sitter.  02/14: Very confused.  Has mittens on.  Continues to attempt to remove IV and had remove the BiPAP mask overnight.  Renal function continues to improve and had peaked at 3.69 it is down to 1.9 today. 2/15 : Patient seen and examined this morning.  Continues to stay confused.  Was on BiPAP overnight.  No acute overnight event reported by nursing.  Labs with hypokalemia and hypomagnesemia repleted..  Continues to stay on 2 L nasal cannula in the day.  Has completed treatment for pneumonia along threshold for reinitiating antibiotics if spikes fever. 2/16 : Patient continues to stay lethargic.  On nasal cannula 2 to 3 L.  Renal functions improving but worsening hypernatremia 148.  Patient started on D5 at 49.  Still deconditioned 2/17 patient seen and examined this morning improvement in mental status compared to yesterday.  However still lethargic.  Hyponatremia improving with sodium of 141 today continue with the patient on D5 at 75.  Magnesium repleted.  Diflucan continued for UTI/yeast infection.  As patient is lethargic ordered ABG   2/18 : Patient seen and examined this morning.  Mental status improved from yesterday.  ABG showed hypercapnia patient refusing BiPAP.  Labs with improving sodium however mild elevation of  creatinine.  Patient is extremely poor p.o. intake.  Discussed with family for possible PEG placement as patient had hard time with NG in the past.   Assessment and Plan:  Principal Problem:   Severe sepsis ([place]) Active Problems:   Acute on chronic respiratory failure with hypoxia and hypercapnia (HCC)   Chronic diastolic CHF (congestive heart failure) ([place])   Essential hypertension   Acute renal failure superimposed on stage 3b chronic kidney disease ([place])   Morbid obesity with BMI of 50.0-59.9, adult (HCC)   OSA    History of stroke   Extreme obesity with alveolar hypoventilation (HCC)   Cellulitis   Acute metabolic encephalopathy   Community acquired pneumonia   Toxic Metabolic Encephalopathy likely CO2 narcosis - resolved then recurred Also concern for multifocal pneumonia and UTI - see below Continue BiPap-now with hypercapnia patient needs BiPAP however refusing. ABG done today is compensated with no evidence of hypercapnia Oral intake remains very poor and patient appears dehydrated with hypernatremia. Judicious IV fluid administration may need alternative means of providing nutrition   Sepsis/SIRS w/ concern for Multifocal Pneumonia per CXR 02/11 Procalcitonin unremarkable  BNP unremarkable  Cefepime + vancomycin for multifocal pneumonia acquired in hospital setting --> d/c 02/13 but low threshold to restart if febrile or increased SOB Repeat CXR in AM BCx likely contaminant  Sepsis d/t UTI per UA 02/11 UCx (+)yeast  DIflucan per pharmacy  Limit IV  fluids d/t hx HFpEF but BNP no concerns, monitor fluid status closely   Acute on Chronic Kidney Injury likely secondary to severe infection/sepsis on admission, was improving then worse again, concern for volume contraction / diuresis effect - improving now Hypernatremia - resolved Contraction Alkalosis - resolved  holding diuretics, holding IVF management per nephrology - there was some concern about possible needing  dialysis but she has been stable for past few days  Appreciate nephrology input Serum creatinine peaked at 3.69 but is down to 1.9.  Will monitor closely   Acute on Chronic Hypoxic and Hypercapnic Respiratory Failure D/t Acute on Chronic HFpEF initially, now concern for restrictive lung disease and multifocal pneumonia  supplemental O2 to keep sats >=90%, wean as tolerated BiPap as above palliative care involved DNR/DNI  continue supportive care with bronchodilators   Acute on Chronic HFpEF - improved Holding diuresis due to renal failure. Avoid aggressive IV fluids  BNP today unremarkable, monitor as needed    Anemia of Chronic Disease Monitor CBC Aranesp weekly    Hypokalemia monitor and replete PRN  Hypernatremia : Sodium 148-141-136 improving Secondary to poor oral intake.  Patient noted to have very dry mucous membranes Gentle IV fluid hydration with D5W to administer free water Repeat sodium levels in a.m.       Physical Exam: Vitals:   07/07/22 0745 07/07/22 0750 07/07/22 0755 07/07/22 1100  BP:   (!) 117/43 (!) 129/46  Pulse: 73 71 71 77  Resp:      Temp:      TempSrc:      SpO2: 99% 98% 98% 99%  Weight:      Height:       Physical Exam Vitals and nursing note reviewed.  Constitutional:      General: She is not in acute distress.    Appearance: She is obese.  HENT:     Head: Normocephalic and atraumatic.     Nose: Nose normal.     Mouth/Throat:     Mouth: Mucous membranes are dry.  Eyes:     Conjunctiva/sclera: Conjunctivae normal.  Cardiovascular:     Rate and Rhythm: Normal rate.  Pulmonary:     Effort: Pulmonary effort is normal.     Breath sounds: Normal breath sounds.  Abdominal:     General: There is no distension.     Palpations: Abdomen is soft.     Comments: Central adiposity  Musculoskeletal:     Cervical back: Normal range of motion and neck supple.     Right lower leg: Edema present.     Left lower leg: Edema present.  Skin:     General: Skin is warm and dry.  Neurological:     Mental Status: She is alert.     Comments: Unable to assess  Psychiatric:     Comments: Unable to assess     Data Reviewed: Labs reviewed.  Magnesium 1.6, sodium 147, BUN 45, creatinine 1.90 white count 6.7, hemoglobin 9.1 There are no new results to review at this time.  Family Communication: As per previous attending who spoke to patient's son over the phone, Darly Schave over the phone.  Discussed her current condition as well as the need for alternative means of providing nutrition.  He states that he had tried an NG tube when she was in ICU but it was very uncomfortable for her.  Considering PEG tube but will discuss with his sister.  Disposition: Status is: Inpatient Remains inpatient appropriate because: Continues  to have electrolyte abnormalities and no improvement in her mental status  Planned Discharge Destination: SNF when stable    Time spent: 40  minutes  Author: Oran Rein, MD 07/07/2022 12:00 PM  For on call review [email or web address].

## 2022-07-07 NOTE — Progress Notes (Signed)
Pt. Unable to tolerate BiPap. Have attempted to put on 3 separate times this morning. Pt. Takes off within 5-10 min and says she feels like she is smothering. Nurse explained the importance of it with every attempt to put it on.

## 2022-07-08 DIAGNOSIS — Z7189 Other specified counseling: Secondary | ICD-10-CM | POA: Diagnosis not present

## 2022-07-08 DIAGNOSIS — A419 Sepsis, unspecified organism: Secondary | ICD-10-CM | POA: Diagnosis not present

## 2022-07-08 DIAGNOSIS — Z515 Encounter for palliative care: Secondary | ICD-10-CM | POA: Diagnosis not present

## 2022-07-08 DIAGNOSIS — R6520 Severe sepsis without septic shock: Secondary | ICD-10-CM | POA: Diagnosis not present

## 2022-07-08 LAB — MAGNESIUM: Magnesium: 1.7 mg/dL (ref 1.7–2.4)

## 2022-07-08 NOTE — Progress Notes (Signed)
Daily Progress Note   Patient Name: Samantha Mason       Date: 07/08/2022 DOB: 02/25/1951  Age: 72 y.o. MRN#: [id number] Attending Physician: Collier Bullock, MD Primary Care Physician: Glean Hess, MD Admit Date: 06/08/2022  Reason for Consultation/Follow-up: Establishing goals of care  HPI/Brief Hospital Review: 72 y.o. female  with past medical history of morbid obesity, chronic HFpEF, CKD (stage III), chronic LE lymphedema (treatment of torsemide), HTN, asthma, stroke (1995), and OSA (noncompliant with CPAP) admitted on 06/08/2022 with feeling unwell for approximately 1 week, chills, generalized weakness, and fatigue.   Treated this admission for sepsis secondary to BLE cellulitis, AKI and acute on chronic hypercapnic respiratory failure in the setting of AECOPD.  2/19 Drowsy on exam, likely secondary to ongoing hypercapnia-per notes refused bipap overnight Ongoing poor PO intake  Palliative Medicine consulted for assistance with goals of care conversations.  Subjective: Extensive chart review has been completed prior to meeting patient including labs, vital signs, imaging, progress notes, orders, and available advanced directive documents from current and previous encounters.    Visited with Ms. Collet at her bedside around breakfast time. Drowsy but responded with eye opening and appropriate head nodding. Required frequent redirection, easily drifted off to sleep when not being addressed. Nursing staff attempting to feed-able to swallow without evidence of difficulty. Insufficient intake even with assistance.  Called son-William to provide update and offer support-at time of call he was visited at bedside-returned to meet in person. [name] reports improvement in Ms.  Wojtowicz's mentation Friday and Saturday but noticed a decline again yesterday and worse today. We discussed refusal of bipap overnight and symptoms consistent with hypercapnia. [name] verbalized understanding and reports Ms. Chiou was not complaint with wearing CPAP at home nightly.  Briefly discussed ongoing goals of care. [name] explains Ms. Defenbaugh has a Living Will-requested copy be sent for further review and scanning into medical record. With ongoing hypercapnia emphasized importance of ongoing conversations regarding [place].  Plan to meet with [name] at bedside 2/20 1:30 PM with his sister-Nancy available on speaker phone as she lives out of state.  PMT to continue to be available to provide ongoing support.  [name] accepts chaplain services for prayer as faith is an important aspect of Ms. Canche's life.  Objective:  Physical Exam Constitutional:  General: She is not in acute distress.    Appearance: She is ill-appearing.  Pulmonary:     Effort: Pulmonary effort is normal. No respiratory distress.  Skin:    General: Skin is warm and dry.  Neurological:     Mental Status: She is easily aroused. She is lethargic.             Vital Signs: BP (!) 144/53 (BP Location: Right Arm)   Pulse 77   Temp (!) 97.5 F (36.4 C) (Oral)   Resp 18   Ht 5' 4"$  (1.626 m)   Wt (!) 144.1 kg   SpO2 100%   BMI 54.53 kg/m  SpO2: SpO2: 100 % O2 Device: O2 Device: Nasal Cannula O2 Flow Rate: O2 Flow Rate (L/min): 3 L/min   Palliative Care Assessment & Plan   Assessment/Recommendation/Plan  DNR Continue current treatment plan Family meeting scheduled for 2/20 1:30 PM PMT to continue to follow for ongoing support and needs   Care plan was discussed with nursing and Dr. Francine Graven.  Thank you for allowing the Palliative Medicine Team to assist in the care of this patient.  Total time:  25 minutes  Greater than 50%  of this time was spent counseling and coordinating care  related to the above assessment and plan.  Theodoro Grist, DNP, AGNP-C Palliative Medicine   Please contact Palliative Medicine Team phone at [phone] for questions and concerns.

## 2022-07-08 NOTE — Progress Notes (Addendum)
Occupational Therapy Treatment Patient Details Name: Samantha Mason MRN: [id number] DOB: [birth year] Today's Date: 07/08/2022   History of present illness 72 y/o female with h/o morbid obesity, OSA, HTN, COPD, CHF, CKD IV, GERD, IBS, RLS and recent admission for SBO (medically managed) who is admitted with CHF, sepsis, UTI, AKI and cellulitis. Pt extubated on 1/31.   OT comments  Patient received sitting up in bed with RN providing assistance with self-feeding. Both agreeable to OT. Tx session focused on functional cognition and self-feeding. Pt with overall flat affect this date. Expressive difficulties noted and perseverating on same one word phrases (i.e. hey, help). She was unable to state her name upon request. Able to eventually state "[place]" once given options to choose from. She inconsistently followed single step commands this date (<25% of time). She was able to retrieve pudding cup and self feed with spoon. She was also able to retrieve cup off of tray in order to take sip of water. Increased time and frequent VC required t/o session. Pt left as received with all needs in reach. Pt is making progress toward goal completion. D/C recommendation remains appropriate. OT will continue to follow acutely.    Recommendations for follow up therapy are one component of a multi-disciplinary discharge planning process, led by the attending physician.  Recommendations may be updated based on patient status, additional functional criteria and insurance authorization.    Follow Up Recommendations  Skilled nursing-short term rehab (<3 hours/day)     Assistance Recommended at Discharge Frequent or constant Supervision/Assistance  Patient can return home with the following  A lot of help with walking and/or transfers;A lot of help with bathing/dressing/bathroom;Assistance with cooking/housework;Assistance with feeding;Direct supervision/assist for medications management;Help with stairs or ramp for  entrance;Assist for transportation   Equipment Recommendations  Other (comment) (per next venue of care)    Recommendations for Other Services      Precautions / Restrictions Precautions Precautions: Fall Restrictions Weight Bearing Restrictions: No       Mobility Bed Mobility        Transfers           Balance             ADL either performed or assessed with clinical judgement   ADL Overall ADL's : Needs assistance/impaired Eating/Feeding: Set up;Supervision/ safety;Bed level;Cueing for sequencing Eating/Feeding Details (indicate cue type and reason): RN already cut up pt's food into bite size pieces. Encouraged pt to retrieve piece of pancake on fork, however, pt preferred chocolate pudding this date. She was able to retrieve pudding cup and self feed with spoon, Mod VC required. Min VC to open eyes and bring attention to meal tray in front of her. Increased time required.              Extremity/Trunk Assessment Upper Extremity Assessment Upper Extremity Assessment: Generalized weakness   Lower Extremity Assessment Lower Extremity Assessment: Generalized weakness        Vision Patient Visual Report: No change from baseline     Perception     Praxis      Cognition Arousal/Alertness: Awake/alert Behavior During Therapy: Flat affect Overall Cognitive Status: Impaired/Different from baseline Area of Impairment: Orientation, Attention, Memory, Problem solving, Awareness, Safety/judgement, Following commands                 Orientation Level: Disoriented to, Person, Place, Time, Situation Current Attention Level: Focused Memory: Decreased recall of precautions, Decreased short-term memory Following Commands: Follows one step commands inconsistently Safety/Judgement:  Decreased awareness of safety, Decreased awareness of deficits Awareness: Intellectual Problem Solving: Slow processing, Decreased initiation, Difficulty sequencing, Requires  verbal cues, Requires tactile cues General Comments: RN reported pt has been confused this date, pulled off telemetry leads and safety mitts.        Exercises      Shoulder Instructions       General Comments      Pertinent Vitals/ Pain       Pain Assessment Pain Assessment: No/denies pain  Home Living              Prior Functioning/Environment              Frequency  Min 2X/week        Progress Toward Goals  OT Goals(current goals can now be found in the care plan section)  Progress towards OT goals: Progressing toward goals  Acute Rehab OT Goals Patient Stated Goal: return home OT Goal Formulation: With patient/family Time For Goal Achievement: 07/17/22 Potential to Achieve Goals: [place] Discharge plan remains appropriate;Frequency remains appropriate    Co-evaluation                 AM-PAC OT "6 Clicks" Daily Activity     Outcome Measure   Help from another person eating meals?: A Little Help from another person taking care of personal grooming?: A Little Help from another person toileting, which includes using toliet, bedpan, or urinal?: A Lot Help from another person bathing (including washing, rinsing, drying)?: A Lot Help from another person to put on and taking off regular upper body clothing?: A Little Help from another person to put on and taking off regular lower body clothing?: A Lot 6 Click Score: 15    End of Session    OT Visit Diagnosis: Muscle weakness (generalized) (M62.81);Unsteadiness on feet (R26.81)   Activity Tolerance Patient tolerated treatment well   Patient Left in bed;with call bell/phone within reach;with bed alarm set;Other (comment) (RN present at end of session to give meds)   Nurse Communication Mobility status        Time: [id number] OT Time Calculation (min): 14 min  Charges: OT General Charges $OT Visit: 1 Visit OT Treatments $Self Care/Home Management : 8-22 mins  [facility] MS,  OTR/L ascom [phone]  07/08/22, 1:44 PM

## 2022-07-08 NOTE — Progress Notes (Signed)
Progress Note   Patient: Samantha Mason [id number] DOB: 1951-01-05 DOA: 06/08/2022     30 DOS: the patient was seen and examined on 07/08/2022   Brief hospital course: 72 y.o. female with medical history significant for Morbid obesity, chronic HFpEF,CKD stage III, chronic lower extremity lymphedema on treatment with torsemide, hypertension, asthma, stroke (1995) and obstructive sleep apnea (non compliant with cpap) to ED w/ CC fatigue, LE swelling/redness. Pt was admitted. Course complicated by CO2 narcosis, failed BiPAP requiring intubation and mechanical ventilation.  Transferred to hospitalist service on 06/24/22. Treated for Acute on Chronic HFpEF. Her respiratory failure has been treated with BiPAP, diuresis, and bronchodilators. She has been refusing BiPAP now. Currently on 2L [place]. Diuresis held, Cr worsens despite this but has stabilized, nephrology following.    1/20: Presented to ED, admitted by [facility] with Sepsis in setting of lower extremity Cellulitis, AKI, and Acute on Chronic Hypercapnic Respiratory Failure in setting AECOPD.  1/21: Added cefepime, continue vancomycin, increase dose of Cardizem for better heart rate control 1/22: Had to reduce Cardizem as blood pressure dropped 1/23: Transferred to ICU overnight due to CO2 narcosis, placed on BiPAP.  PCCM consulted.  Worsening AKI, holding diuresis.  Agitation requiring Precedex.  Palliative Care consulted for [place].  ABX broadened back to Cefepime and Vancomycin 1/24: Pt on Bipap but awake and able to follow commands. Attempt to transition off Bipap.  1/25: Pt currently on Bipap and precedex gtt.  Required 2.5 mg iv valium overnight for delirium.  Unable to obtain CT Head due to respiratory status.  Ultimately required intubation. 1/26: Remains critically ill. Issues with vent dyssynchrony and ventilation, plan to start Nimbex gtt. Creatinine improved with diuresis, continue with diuresis, Nephrology considering Lasix gtt. 1/27: ABG's  improved on Nimbex, persistent cuff leak, exchanged ETT. Decadron started due to upper airway swelling. Will attempt to d/c Nimbex. Good diuresis with Lasix gtt with 4.5 L of UOP last 24 hrs, Creatinine improving. 1/28: Continues with good diuresis on Lasix gtt  1/29: No acute events overnight. Remained mechanically intubated with minimal vent settings.  Remains on lasix gtt.  Not on sedation and attempting to follow commands but extremely weak.   1/30: No acute events overnight.  Pt awake and following commands on precedex gtt.  Lasix gtt. Tolerating SBT 15/8 1/31: No acute events, awake and following commands on low dose precedex.  Remains on Lasix gtt,  Creatinine remains stable BUN slowly uptrending. Tolerating SBT 15/8 ~ will continue to wean PSV as tolerated ~ EXTUBATED. Lasix gtt d/c following extubation.  Following extubation, pt confirmed DNR/DNI status in presence of her son at bedside. 2/1: Tolerated BiPAP overnight, weaned to [facility] this am.  Speech evaluation performed, which pt FAILED. Consult PT/OT.  Creatinine continues to improve, started on D5W @ 50 ml/hr due to Hypernatremia of 155.  Holding diuresis today 2/2: Pt febrile on Bipap @50$ %.  Pt awake and following commands.  Failed speech evaluation on 02/1 and declining NGT placement.  2/2: Venous US BLE: No DVT 2/3: Agreeable to small bore NGT placement and dialysis if needed.  Again confirmed DNR/DNI  2/4: Nursing staff reporting pt now tolerating ice chips without signs of aspiration. NG was not needed.  2/5: back to hospitalist service. Creatinine uptrending.  2/6-2/09: Cr not improving but has been stable -  nephrology following, pt agreeable to temporary HD if needed, nephro recs continue to monitor UOP. No acute need for dialysis. Continue to hold diuretics. Liquid stool persist, GI PCR  negative, KUB no concerns.   02/10-02/11: Cr starting to improve some.  02/11 overnight insomnia, given Rx and sleep around 04:30, was a bit more  confused on AM rounds likely med effect + hypercarbia as she has been refusing BiPap - found hypercarbic on ABG and placed back on BiPap and transferred to Progressive unit. UA also concerning for UTI, CXR concerning for PNA. (+)Sepsis criteria  02/12: Cr thankfully improving. Still confused. Remains on BiPap. See IPAL note for discussion w/ son.  02/13: 1/4 BCx(+) likely contaminant. Urine growing yeast. BNP and Procal, neither remarkably elevated, will not administer diuresis but will repeat CXR in AM and d/c abx other than diflucan for yeast in urine. Remains hypercarbic and on BiPap, more alert today but still trying to pull off mask and requiring frequent redirection/sitter.  02/14: Very confused.  Has mittens on.  Continues to attempt to remove IV and had remove the BiPAP mask overnight.  Renal function continues to improve and had peaked at 3.69 it is down to 1.9 today. 2/15 : Patient seen and examined this morning.  Continues to stay confused.  Was on BiPAP overnight.  No acute overnight event reported by nursing.  Labs with hypokalemia and hypomagnesemia repleted..  Continues to stay on 2 L nasal cannula in the day.  Has completed treatment for pneumonia along threshold for reinitiating antibiotics if spikes fever. 2/16 : Patient continues to stay lethargic.  On nasal cannula 2 to 3 L.  Renal functions improving but worsening hypernatremia 148.  Patient started on D5 at 53.  Still deconditioned 2/17 patient seen and examined this morning improvement in mental status compared to yesterday.  However still lethargic.  Hyponatremia improving with sodium of 141 today continue with the patient on D5 at 75.  Magnesium repleted.  Diflucan continued for UTI/yeast infection.  As patient is lethargic ordered ABG   2/18 : Patient seen and examined this morning.  Mental status improved from yesterday.  ABG showed hypercapnia patient refusing BiPAP.  Labs with improving sodium however mild elevation of  creatinine.  Patient is extremely poor p.o. intake.  Discussed with family for possible PEG placement as patient had hard time with NG in the past.   2/19: Patient is seen and examined at the bedside.  She is more awake but still remains very confused.  Oral intake remains very poor.  Family meeting scheduled for a.m. to discuss goals of care  Assessment and Plan:  Principal Problem:   Severe sepsis ([place]) Active Problems:   Acute on chronic respiratory failure with hypoxia and hypercapnia (HCC)   Chronic diastolic CHF (congestive heart failure) (HCC)   Essential hypertension   Acute renal failure superimposed on stage 3b chronic kidney disease ([place])   Morbid obesity with BMI of 50.0-59.9, adult (HCC)   OSA    History of stroke   Extreme obesity with alveolar hypoventilation (HCC)   Cellulitis   Acute metabolic encephalopathy   Community acquired pneumonia   Toxic Metabolic Encephalopathy likely CO2 narcosis - resolved then recurred Also concern for multifocal pneumonia and UTI  Hypernatremia Patient has been noncompliant with BiPAP ABG done today is compensated with no evidence of hypercapnia Oral intake remains very poor and patient appears dehydrated with hypernatremia. Judicious IV fluid administration and may need alternative means of providing nutrition   Sepsis/SIRS w/ concern for Multifocal Pneumonia per CXR 02/11 Procalcitonin unremarkable  BNP unremarkable  Cefepime + vancomycin for multifocal pneumonia acquired in hospital setting --> d/c 02/13 but  low threshold to restart if febrile or increased SOB Repeat CXR in AM BCx likely contaminant   Sepsis d/t UTI per UA 02/11 UCx (+)yeast  DIflucan per pharmacy  Limit IV fluids d/t hx HFpEF but BNP no concerns, monitor fluid status closely   Acute on Chronic Kidney Injury likely secondary to severe infection/sepsis on admission, was improving then worse again, concern for volume contraction / diuresis effect - improving  now Hypernatremia - resolved Contraction Alkalosis - resolved  holding diuretics Judicious IV fluid hydration management per nephrology - there was some concern about possible needing dialysis but she has been stable for past few days  Appreciate nephrology input Renal function continues to improve   Acute on Chronic Hypoxic and Hypercapnic Respiratory Failure D/t Acute on Chronic HFpEF initially, now concern for restrictive lung disease and multifocal pneumonia  supplemental O2 to keep sats >=90% when off BiPAP BiPap as above palliative care involved DNR/DNI  continue supportive care with bronchodilators   Acute on Chronic HFpEF - improved Holding diuresis due to renal failure. Avoid aggressive IV fluids  BNP today unremarkable, monitor as needed    Anemia of Chronic Disease Monitor CBC Aranesp weekly    Hypokalemia monitor and replete PRN   Hypernatremia : Sodium 148-141-136 improving Secondary to poor oral intake.  Patient noted to have very dry mucous membranes Gentle IV fluid hydration with D5W to administer free water Sodium levels have improved                    Physical Exam: Vitals:   07/07/22 2327 07/08/22 0400 07/08/22 0812 07/08/22 0932  BP: (!) 140/44 (!) 135/50  (!) 144/53  Pulse: 80 79 81 77  Resp: 18 16 18 18  $ Temp: 98.3 F (36.8 C) 98.6 F (37 C)  (!) 97.5 F (36.4 C)  TempSrc: Oral Oral  Oral  SpO2: 99% 99% 97% 100%  Weight:      Height:       Physical Exam Vitals and nursing note reviewed.  Constitutional:      Appearance: She is obese.     Comments: Awake and alert.  Oriented only to person  HENT:     Head: Normocephalic and atraumatic.     Nose: Nose normal.     Mouth/Throat:     Mouth: Mucous membranes are dry.  Eyes:     Comments: Pale conjunctiva  Cardiovascular:     Rate and Rhythm: Normal rate and regular rhythm.  Pulmonary:     Effort: Pulmonary effort is normal.     Breath sounds: Normal breath sounds.   Abdominal:     General: Bowel sounds are normal.     Palpations: Abdomen is soft.  Musculoskeletal:     Cervical back: Normal range of motion and neck supple.     Right lower leg: Edema present.     Left lower leg: Edema present.  Skin:    General: Skin is warm and dry.  Neurological:     Motor: Weakness present.  Psychiatric:     Comments: Unable to assess     Data Reviewed: Labs reviewed.  Magnesium 1.7 There are no new results to review at this time.  Family Communication: Family meeting is scheduled for a.m. to discuss goals of care  Disposition: Status is: Inpatient Remains inpatient appropriate because: Continues to require treatment for toxic metabolic encephalopathy  Planned Discharge Destination: Skilled nursing facility    Time spent: 35 minutes  Author: Collier Bullock, MD  07/08/2022 3:35 PM  For on call review [email or web address].

## 2022-07-08 NOTE — Progress Notes (Signed)
   07/08/22 1600  Spiritual Encounters  Type of Visit Initial  Care provided to: Pt and family  Conversation partners present during encounter Nurse  Referral source Nurse (RN/NT/LPN)  Reason for visit Urgent spiritual support  OnCall Visit No  Interventions  Spiritual Care Interventions Made Prayer  Intervention Outcomes  Outcomes Connected to [place] Issues Still Outstanding No further spiritual care needs at this time (see row info)   In responding to a Spiritual consult, this Chap visited the Pt at the bedside. Son was in the room. "Mom has been a strong in faith most of her life" says her son. Melven Sartorius offered a compassionate presence and a prayer as requested.

## 2022-07-08 NOTE — Progress Notes (Signed)
Physical Therapy Treatment Patient Details Name: Samantha Mason MRN: [id number] DOB: 03-Mar-1951 Today's Date: 07/08/2022   History of Present Illness 72 y/o female with h/o morbid obesity, OSA, HTN, COPD, CHF, CKD IV, GERD, IBS, RLS and recent admission for SBO (medically managed) who is admitted with CHF, sepsis, UTI, AKI and cellulitis. Pt extubated on 1/31.    PT Comments    Pt is not making progress towards goals with very lethargic and unable to follow commands. Becomes alert with sternal rub, however takes maximal stimulation to maintain alertness and quickly falls asleep. Follows <10% of commands. Demonstrates spontaneous movement of B UE, assisted with positioning on pillows for skin integrity including B LEs. Unable to consistently participate in last few therapy sessions. O2 sats WNL at this time. Discussed with TOC, unable to meet goals with skilled rehab. Will await for family care meeting to determine goals of care. Will follow from a distance.  Recommendations for follow up therapy are one component of a multi-disciplinary discharge planning process, led by the attending physician.  Recommendations may be updated based on patient status, additional functional criteria and insurance authorization.  Follow Up Recommendations  Long-term institutional care without follow-up therapy Can patient physically be transported by private vehicle: No   Assistance Recommended at Discharge Frequent or constant Supervision/Assistance  Patient can return home with the following Two people to help with walking and/or transfers;Two people to help with bathing/dressing/bathroom   Equipment Recommendations  None recommended by PT    Recommendations for Other Services       Precautions / Restrictions Precautions Precautions: Fall Restrictions Weight Bearing Restrictions: No     Mobility  Bed Mobility               General bed mobility comments: not safe as very lethargic and  unable to follow commands    Transfers                        Ambulation/Gait                   Stairs             Wheelchair Mobility    Modified Rankin (Stroke Patients Only)       Balance                                            Cognition Arousal/Alertness: Lethargic Behavior During Therapy: Flat affect Overall Cognitive Status: Impaired/Different from baseline                                 General Comments: very confused, doesn't recognize son in room. Isn't following commands.        Exercises Other Exercises Other Exercises: attempted purposeful movement. Unable to particiapte. Assisted with repositioning for skin protection with pillows. Education given to son about atrophy and joint protection.    General Comments        Pertinent Vitals/Pain Pain Assessment Pain Assessment: No/denies pain    Home Living                          Prior Function            PT Goals (current goals can now be found in  the care plan section) Acute Rehab PT Goals Patient Stated Goal: get stronger PT Goal Formulation: Patient unable to participate in goal setting Time For Goal Achievement: 07/17/22 Progress towards PT goals: Not progressing toward goals - comment    Frequency    Min 2X/week      PT Plan Discharge plan needs to be updated    Co-evaluation              AM-PAC PT "6 Clicks" Mobility   Outcome Measure  Help needed turning from your back to your side while in a flat bed without using bedrails?: Total Help needed moving from lying on your back to sitting on the side of a flat bed without using bedrails?: Total Help needed moving to and from a bed to a chair (including a wheelchair)?: Total Help needed standing up from a chair using your arms (e.g., wheelchair or bedside chair)?: Total Help needed to walk in hospital room?: Total Help needed climbing 3-5 steps with a  railing? : Total 6 Click Score: 6    End of Session Equipment Utilized During Treatment: Oxygen Activity Tolerance: Patient limited by lethargy Patient left: in bed;with call bell/phone within reach;with bed alarm set;with nursing/sitter in room;with family/visitor present Nurse Communication: Mobility status PT Visit Diagnosis: Muscle weakness (generalized) (M62.81);Other abnormalities of gait and mobility (R26.89);Difficulty in walking, not elsewhere classified (R26.2) Pain - Right/Left: Left Pain - part of body: Leg     Time: [id number] PT Time Calculation (min) (ACUTE ONLY): 17 min  Charges:  $Therapeutic Activity: 8-22 mins                     Greggory Stallion, PT, DPT, GCS [phone]    Elam Ellis 07/08/2022, 4:32 PM

## 2022-07-09 ENCOUNTER — Ambulatory Visit: Payer: Medicare Other | Admitting: Family

## 2022-07-09 DIAGNOSIS — E662 Morbid (severe) obesity with alveolar hypoventilation: Secondary | ICD-10-CM | POA: Diagnosis not present

## 2022-07-09 DIAGNOSIS — A419 Sepsis, unspecified organism: Secondary | ICD-10-CM | POA: Diagnosis not present

## 2022-07-09 DIAGNOSIS — Z7189 Other specified counseling: Secondary | ICD-10-CM | POA: Diagnosis not present

## 2022-07-09 DIAGNOSIS — J9621 Acute and chronic respiratory failure with hypoxia: Secondary | ICD-10-CM | POA: Diagnosis not present

## 2022-07-09 DIAGNOSIS — R6520 Severe sepsis without septic shock: Secondary | ICD-10-CM | POA: Diagnosis not present

## 2022-07-09 LAB — BLOOD GAS, ARTERIAL
Acid-Base Excess: 9.2 mmol/L — ABNORMAL HIGH (ref 0.0–2.0)
Bicarbonate: 38 mmol/L — ABNORMAL HIGH (ref 20.0–28.0)
O2 Content: 3.5 L/min
O2 Saturation: 99.6 %
Patient temperature: 37
pCO2 arterial: 79 mmHg (ref 32–48)
pH, Arterial: 7.29 — ABNORMAL LOW (ref 7.35–7.45)
pO2, Arterial: 107 mmHg (ref 83–108)

## 2022-07-09 LAB — MAGNESIUM: Magnesium: 1.7 mg/dL (ref 1.7–2.4)

## 2022-07-09 MED ORDER — ONDANSETRON 4 MG PO TBDP: 4.0000 mg | ORAL_TABLET | Freq: Four times a day (QID) | ORAL | Status: DC | PRN

## 2022-07-09 MED ORDER — LORAZEPAM 2 MG/ML PO CONC: 1.0000 mg | ORAL | Status: DC | PRN

## 2022-07-09 MED ORDER — ONDANSETRON HCL 4 MG/2ML IJ SOLN: 4.0000 mg | Freq: Four times a day (QID) | INTRAMUSCULAR | Status: DC | PRN

## 2022-07-09 MED ORDER — GLYCOPYRROLATE 1 MG PO TABS: 1.0000 mg | ORAL_TABLET | ORAL | Status: DC | PRN

## 2022-07-09 MED ORDER — LORAZEPAM 1 MG PO TABS
1.0000 mg | ORAL_TABLET | ORAL | Status: DC | PRN
  Administered 2022-07-09: 1 mg via ORAL
  Filled 2022-07-09: qty 1

## 2022-07-09 MED ORDER — MORPHINE SULFATE (CONCENTRATE) 10 MG/0.5ML PO SOLN
5.0000 mg | ORAL | Status: DC | PRN
  Administered 2022-07-09: 5 mg via ORAL
  Filled 2022-07-09: qty 0.5

## 2022-07-09 MED ORDER — HALOPERIDOL LACTATE 5 MG/ML IJ SOLN: 0.5000 mg | INTRAMUSCULAR | Status: DC | PRN

## 2022-07-09 MED ORDER — HALOPERIDOL LACTATE 2 MG/ML PO CONC
0.5000 mg | ORAL | Status: DC | PRN
  Administered 2022-07-09: 0.5 mg via SUBLINGUAL
  Filled 2022-07-09: qty 5
  Filled 2022-07-09: qty 0.25

## 2022-07-09 MED ORDER — BIOTENE DRY MOUTH MT LIQD: 15.0000 mL | OROMUCOSAL | Status: DC | PRN

## 2022-07-09 MED ORDER — LORAZEPAM 2 MG/ML IJ SOLN: 1.0000 mg | INTRAMUSCULAR | Status: DC | PRN

## 2022-07-09 MED ORDER — HALOPERIDOL 0.5 MG PO TABS: 0.5000 mg | ORAL_TABLET | ORAL | Status: DC | PRN

## 2022-07-09 MED ORDER — MORPHINE SULFATE (PF) 2 MG/ML IV SOLN: 1.0000 mg | Freq: Once | INTRAVENOUS | Status: DC

## 2022-07-09 MED ORDER — MORPHINE SULFATE (PF) 2 MG/ML IV SOLN: 1.0000 mg | INTRAVENOUS | Status: DC | PRN

## 2022-07-09 MED ORDER — GLYCOPYRROLATE 0.2 MG/ML IJ SOLN: 0.2000 mg | INTRAMUSCULAR | Status: DC | PRN

## 2022-07-09 MED ORDER — ACETAMINOPHEN 650 MG RE SUPP: 650.0000 mg | Freq: Four times a day (QID) | RECTAL | Status: DC | PRN

## 2022-07-09 MED ORDER — ACETAMINOPHEN 325 MG PO TABS: 650.0000 mg | ORAL_TABLET | Freq: Four times a day (QID) | ORAL | Status: DC | PRN

## 2022-07-09 MED ORDER — POLYVINYL ALCOHOL 1.4 % OP SOLN: 1.0000 [drp] | Freq: Four times a day (QID) | OPHTHALMIC | Status: DC | PRN

## 2022-07-09 NOTE — Progress Notes (Signed)
Daily Progress Note   Patient Name: Samantha Mason       Date: 07/09/2022 DOB: Jan 26, 1951  Age: 72 y.o. MRN#: [id number] Attending Physician: Samantha Bullock, MD Primary Care Physician: Samantha Hess, MD Admit Date: 06/08/2022  Reason for Consultation/Follow-up: Establishing goals of care  HPI/Brief Hospital Review: 72 y.o. female  with past medical history of morbid obesity, chronic HFpEF, CKD (stage III), chronic LE lymphedema (treatment of torsemide), HTN, asthma, stroke (1995), and OSA (noncompliant with CPAP) admitted on 06/08/2022 with feeling unwell for approximately 1 week, chills, generalized weakness, and fatigue.    Treated this admission for sepsis secondary to BLE cellulitis, AKI and acute on chronic hypercapnic respiratory failure in the setting of AECOPD.   2/20 Remains drowsy on exam with ongoing encephalopathy Moaning and crying out Refused bipap again overnight and throughout day Hypercapnia remains   Palliative Medicine consulted for assistance with goals of care conversations.  Subjective: Extensive chart review has been completed prior to meeting patient including labs, vital signs, imaging, progress notes, orders, and available advanced directive documents from current and previous encounters.    Met with Samantha Mason at her bedside. Awake but remains confused-unable to participate in conversation, able to able simple yes or no questions.  Met with Samantha Mason in [place] with Samantha Mason available via speaker phone. Samantha Mason and Samantha Mason both able to voice their understanding of Samantha Mason's overall medical condition. Voice understanding of continued elevation of CO2 levels contributing to her confusion. Able to explain underlying chronic conditions contributing  to ongoing hypercapnia-such as heart failure, OSA and morbid obesity. Explained at this time, bipap required to regulate CO2 levels but Samantha Mason's ongoing refusal poses a challenge.  Briefly discussed next steps in alignment with Samantha Mason's goals of care. Introduced hospice services [place] agree to meeting with hospice liaison to further discuss eligibility and options available.  After meeting with hospice, Samantha Mason and Samantha Mason agree to hospice services. Attempting to discharge hospice facility today. Samantha Mason agrees to transition to comfort measures while awaiting disposition.  Nursing notified of Samantha Mason's IV removal-appropriate orders placed.  PMT to continue to follow for ongoing needs and support.  Objective:      Vital Signs: BP (!) 126/38 (BP Location: Left Arm)   Pulse 81   Temp 98.7 F (37.1 C)  Resp 18   Ht 5' 4"$  (1.626 m)   Wt (!) 144.2 kg   SpO2 100%   BMI 54.57 kg/m  SpO2: SpO2: 100 % O2 Device: O2 Device: Nasal Cannula O2 Flow Rate: O2 Flow Rate (L/min): 3 L/min   Palliative Care Assessment & Plan   Assessment/Recommendation/Plan  DNR Full Comfort Measures Concentrated Morphine 22m PO q2h PRN pain/dyspnea/SHOB/increased WOB Ativan 1 mg PO/SL q4h PRN for agitation/anxiety   Care plan was discussed with nursing staff and Dr. AFrancine Mason  Thank you for allowing the Palliative Medicine Team to assist in the care of this patient.  TTheodoro Grist DNP, AGNP-C Palliative Medicine   Please contact Palliative Medicine Team phone at [phone] questions and concerns.

## 2022-07-09 NOTE — Discharge Summary (Signed)
Physician Discharge Summary   Patient: Samantha Mason MRN: [id number] DOB: [birth year]  Admit date:     06/08/2022  Discharge date: 07/09/22  Discharge Physician: Bernarda Erck   PCP: Glean Hess, MD   Recommendations at discharge:   Discharge to inpatient hospice  Discharge Diagnoses: Principal Problem:   Severe sepsis [facility]) Active Problems:   Acute on chronic respiratory failure with hypoxia and hypercapnia (HCC)   Chronic diastolic CHF (congestive heart failure) (HCC)   Essential hypertension   Acute renal failure superimposed on stage 3b chronic kidney disease ([place])   Morbid obesity with BMI of 50.0-59.9, adult (HCC)   OSA    History of stroke   Extreme obesity with alveolar hypoventilation (HCC)   Cellulitis   Acute metabolic encephalopathy   Community acquired pneumonia   Advance care planning  Resolved Problems:   * No resolved hospital problems. *  Hospital Course: 72 y.o. female with medical history significant for Morbid obesity, chronic HFpEF,CKD stage III, chronic lower extremity lymphedema on treatment with torsemide, hypertension, asthma, stroke (1995) and obstructive sleep apnea (non compliant with cpap) to ED w/ CC fatigue, LE swelling/redness. Pt was admitted. Course complicated by CO2 narcosis, failed BiPAP requiring intubation and mechanical ventilation.  Transferred to hospitalist service on 06/24/22. Treated for Acute on Chronic HFpEF. Her respiratory failure has been treated with BiPAP, diuresis, and bronchodilators. She has been refusing BiPAP now. Currently on 2L [place]. Diuresis held, Cr worsens despite this but has stabilized, nephrology following.    1/20: Presented to ED, admitted by [facility] with Sepsis in setting of lower extremity Cellulitis, AKI, and Acute on Chronic Hypercapnic Respiratory Failure in setting AECOPD.  1/21: Added cefepime, continue vancomycin, increase dose of Cardizem for better heart rate control 1/22: Had to reduce Cardizem as  blood pressure dropped 1/23: Transferred to ICU overnight due to CO2 narcosis, placed on BiPAP.  PCCM consulted.  Worsening AKI, holding diuresis.  Agitation requiring Precedex.  Palliative Care consulted for [place].  ABX broadened back to Cefepime and Vancomycin 1/24: Pt on Bipap but awake and able to follow commands. Attempt to transition off Bipap.  1/25: Pt currently on Bipap and precedex gtt.  Required 2.5 mg iv valium overnight for delirium.  Unable to obtain CT Head due to respiratory status.  Ultimately required intubation. 1/26: Remains critically ill. Issues with vent dyssynchrony and ventilation, plan to start Nimbex gtt. Creatinine improved with diuresis, continue with diuresis, Nephrology considering Lasix gtt. 1/27: ABG's improved on Nimbex, persistent cuff leak, exchanged ETT. Decadron started due to upper airway swelling. Will attempt to d/c Nimbex. Good diuresis with Lasix gtt with 4.5 L of UOP last 24 hrs, Creatinine improving. 1/28: Continues with good diuresis on Lasix gtt  1/29: No acute events overnight. Remained mechanically intubated with minimal vent settings.  Remains on lasix gtt.  Not on sedation and attempting to follow commands but extremely weak.   1/30: No acute events overnight.  Pt awake and following commands on precedex gtt.  Lasix gtt. Tolerating SBT 15/8 1/31: No acute events, awake and following commands on low dose precedex.  Remains on Lasix gtt,  Creatinine remains stable BUN slowly uptrending. Tolerating SBT 15/8 ~ will continue to wean PSV as tolerated ~ EXTUBATED. Lasix gtt d/c following extubation.  Following extubation, pt confirmed DNR/DNI status in presence of her son at bedside. 2/1: Tolerated BiPAP overnight, weaned to [facility] this am.  Speech evaluation performed, which pt FAILED. Consult PT/OT.  Creatinine continues to improve,  started on D5W @ 50 ml/hr due to Hypernatremia of 155.  Holding diuresis today 2/2: Pt febrile on Bipap @50$ %.  Pt awake and  following commands.  Failed speech evaluation on 02/1 and declining NGT placement.  2/2: Venous US BLE: No DVT 2/3: Agreeable to small bore NGT placement and dialysis if needed.  Again confirmed DNR/DNI  2/4: Nursing staff reporting pt now tolerating ice chips without signs of aspiration. NG was not needed.  2/5: back to hospitalist service. Creatinine uptrending.  2/6-2/09: Cr not improving but has been stable -  nephrology following, pt agreeable to temporary HD if needed, nephro recs continue to monitor UOP. No acute need for dialysis. Continue to hold diuretics. Liquid stool persist, GI PCR negative, KUB no concerns.   02/10-02/11: Cr starting to improve some.  02/11 overnight insomnia, given Rx and sleep around 04:30, was a bit more confused on AM rounds likely med effect + hypercarbia as she has been refusing BiPap - found hypercarbic on ABG and placed back on BiPap and transferred to Progressive unit. UA also concerning for UTI, CXR concerning for PNA. (+)Sepsis criteria  02/12: Cr thankfully improving. Still confused. Remains on BiPap. See IPAL note for discussion w/ son.  02/13: 1/4 BCx(+) likely contaminant. Urine growing yeast. BNP and Procal, neither remarkably elevated, will not administer diuresis but will repeat CXR in AM and d/c abx other than diflucan for yeast in urine. Remains hypercarbic and on BiPap, more alert today but still trying to pull off mask and requiring frequent redirection/sitter.  02/14: Very confused.  Has mittens on.  Continues to attempt to remove IV and had remove the BiPAP mask overnight.  Renal function continues to improve and had peaked at 3.69 it is down to 1.9 today. 2/15 : Patient seen and examined this morning.  Continues to stay confused.  Was on BiPAP overnight.  No acute overnight event reported by nursing.  Labs with hypokalemia and hypomagnesemia repleted..  Continues to stay on 2 L nasal cannula in the day.  Has completed treatment for pneumonia along  threshold for reinitiating antibiotics if spikes fever. 2/16 : Patient continues to stay lethargic.  On nasal cannula 2 to 3 L.  Renal functions improving but worsening hypernatremia 148.  Patient started on D5 at 19.  Still deconditioned 2/17 patient seen and examined this morning improvement in mental status compared to yesterday.  However still lethargic.  Hypernatremia improving with sodium of 141 today continue with the patient on D5 at 75.  Magnesium repleted.  Diflucan continued for UTI/yeast infection.  As patient is lethargic ordered ABG   2/18 : Patient seen and examined this morning.  Mental status improved from yesterday.  ABG showed hypercapnia patient refusing BiPAP.  Labs with improving sodium however mild elevation of creatinine.  Patient is extremely poor p.o. intake.  Discussed with family for possible PEG placement as patient had hard time with NG in the past.   2/19: Patient is seen and examined at the bedside.  She is more awake but still remains very confused.  Oral intake remains very poor.  Family meeting scheduled for a.m. to discuss goals of care   2/20: Patient is seen and examined at bedside.  Very lethargic.  ABG shows persistent hypercapnia.  Patient continues to refuse BiPAP.  Had extensive conversation with patient's son and palliative care also discussed with patient's son and he is agreeable to inpatient hospice  Assessment and Plan:  Principal Problem:   Severe sepsis ([place])  Active Problems:   Acute on chronic respiratory failure with hypoxia and hypercapnia (HCC)   Chronic diastolic CHF (congestive heart failure) (HCC)   Essential hypertension   Acute renal failure superimposed on stage 3b chronic kidney disease (HCC)   Morbid obesity with BMI of 50.0-59.9, adult (HCC)   OSA    History of stroke   Extreme obesity with alveolar hypoventilation (HCC)   Cellulitis   Acute metabolic encephalopathy   Community acquired pneumonia   Toxic Metabolic  Encephalopathy likely CO2 narcosis -recurrent Also concern for multifocal pneumonia and UTI  Hypernatremia Patient has been noncompliant with BiPAP and continues to refuse resulted in persistent hypercapnia and mental status changes Oral intake remains very poor and patient appears dehydrated with hypernatremia. Judicious IV fluid administration and may need alternative means of providing nutrition   Sepsis/SIRS w/ concern for Multifocal Pneumonia per CXR 02/11 Procalcitonin unremarkable  BNP unremarkable  Cefepime + vancomycin for multifocal pneumonia acquired in hospital setting --> d/c 02/13 but low threshold to restart if febrile or increased SOB     Sepsis d/t UTI per UA 02/11 UCx (+)yeast  DIflucan per pharmacy      Acute on Chronic Kidney Injury likely secondary to severe infection/sepsis on admission, was improving then worse again, concern for volume contraction / diuresis effect - improving now Hypernatremia - resolved Contraction Alkalosis - resolved  holding diuretics Judicious IV fluid hydration management per nephrology - there was some concern about possible needing dialysis but she has been stable for past few days  Appreciate nephrology input Renal function continues to improve   Acute on Chronic Hypoxic and Hypercapnic Respiratory Failure D/t Acute on Chronic HFpEF initially, now concern for restrictive lung disease and multifocal pneumonia  supplemental O2 to keep sats >=90% when off BiPAP BiPap as above palliative care involved DNR/DNI  continue supportive care with bronchodilators Had an extensive discussion with patient's son and palliative care met with him as well and patient will be discharged to an inpatient hospice   Acute on Chronic HFpEF - improved Holding diuresis due to renal failure. Avoid aggressive IV fluids  BNP today unremarkable, monitor as needed    Anemia of Chronic Disease Monitor CBC Aranesp weekly    Hypokalemia monitor and  replete PRN   Hypernatremia : Sodium 148-141-136 improving Secondary to poor oral intake.  Patient noted to have very dry mucous membranes Gentle IV fluid hydration with D5W to administer free water Sodium levels have improved Patient not a candidate for alternative means of feeding as she remains very confused and continues to pull out IVs and her BiPAP mask.  I am concerned that she may pull out the PEG tube as well             Consultants: Nephrology, Palliative care Procedures performed: Intubation and mechanical ventilation Disposition: Inpatient Hospice Diet recommendation: Pleasure Feeds as tolerated Discharge Diet Orders (From admission, onward)     Start     Ordered   07/09/22 0000  Diet - low sodium heart healthy        07/09/22 1707           NPO   Pleasure feeds DISCHARGE MEDICATION: Allergies as of 07/09/2022       Reactions   Nuvigil [armodafinil] Hives   Penicillins Diarrhea, Nausea And Vomiting   Did it involve swelling of the face/tongue/throat, SOB, or low BP? no Did it involve sudden or severe rash/hives, skin peeling, or any reaction on the inside of your  mouth or nose? No Did you need to seek medical attention at a hospital or doctor's office? No When did it last happen?  in her 20s    If all above answers are "NO", may proceed with cephalosporin use.   Spironolactone Itching   Gabapentin Other (See Comments)   "wiped her out, couldn't stay awake"   Melatonin Other (See Comments)   "Brain fog"   Provigil [modafinil] Hives   Entresto [sacubitril-valsartan] Itching   Lisinopril Cough        Medication List     STOP taking these medications    acetaminophen 500 MG tablet Commonly known as: TYLENOL   benzonatate 100 MG capsule Commonly known as: TESSALON   budesonide 0.5 MG/2ML nebulizer solution Commonly known as: PULMICORT   calcitRIOL 0.25 MCG capsule Commonly known as: ROCALTROL   dapagliflozin propanediol 10 MG Tabs  tablet Commonly known as: Farxiga   diltiazem 240 MG 24 hr capsule Commonly known as: CARDIZEM CD   feeding supplement Liqd   ipratropium-albuterol 0.5-2.5 (3) MG/3ML Soln Commonly known as: DUONEB   metoCLOPramide 5 MG tablet Commonly known as: REGLAN   metolazone 2.5 MG tablet Commonly known as: ZAROXOLYN   NON FORMULARY   ondansetron 4 MG tablet Commonly known as: Zofran   oxyCODONE HCl 7.5 MG Tabs   OXYGEN   pantoprazole 40 MG tablet Commonly known as: PROTONIX   Prevalite 4 g packet Generic drug: cholestyramine light   promethazine 25 MG tablet Commonly known as: PHENERGAN   rOPINIRole 1 MG tablet Commonly known as: REQUIP   Torsemide 40 MG Tabs               Discharge Care Instructions  (From admission, onward)           Start     Ordered   07/09/22 0000  Discharge wound care:       Comments: Interdry to abdominal pannus and inguinal folds Measure and cut length of InterDry to fit in skin folds that have skin breakdown Tuck InterDry fabric into skin folds in a single layer, allow for 2 inches of overhang from skin edges to allow for wicking to occur May remove to bathe; dry area thoroughly and then tuck into affected areas again Do not apply any creams or ointments when using InterDry DO NOT THROW AWAY FOR 5 DAYS unless soiled with stool DO NOT [facility] product, this will inactivate the silver in the material  New sheet of Interdry should be applied after 5 days of use if patient continues to have skin breakdown  Moisturize legs with emollient cream Wrap with kerlix and ace wrap from below toes to below knee for modified light compression.  Change every other day   07/09/22 1707            Contact information for after-discharge care     Destination     [facility] .   Service: Skilled Nursing Contact information: [address] [place] [phone]                     Discharge Exam: Filed Weights   07/06/22 0428 07/07/22 0551 07/09/22 0458  Weight: (!) 143.7 kg (!) 144.1 kg (!) 144.2 kg   Constitutional:      Appearance: She is obese.     Comments: lethargic HENT:     Head: Normocephalic and atraumatic.     Nose: Nose normal.     Mouth/Throat:  Mouth: Mucous membranes are dry.  Eyes:     Comments: Pale conjunctiva  Cardiovascular:     Rate and Rhythm: Normal rate and regular rhythm.  Pulmonary:     Effort: Pulmonary effort is normal.     Breath sounds: Normal breath sounds.  Abdominal:     General: Bowel sounds are normal.     Palpations: Abdomen is soft.  Musculoskeletal:     Cervical back: Normal range of motion and neck supple.     Right lower leg: Edema present.     Left lower leg: Edema present.  Skin:    General: Skin is warm and dry.  Neurological:     Motor: Weakness present.  Psychiatric:     Comments: Unable to assess   Condition at discharge: poor  The results of significant diagnostics from this hospitalization (including imaging, microbiology, ancillary and laboratory) are listed below for reference.   Imaging Studies: DG Chest Port 1 View  Result Date: 07/03/2022 CLINICAL DATA:  Provided history: Abnormal lung sounds. EXAM: PORTABLE CHEST 1 VIEW COMPARISON:  Prior chest radiographs 06/30/2022. FINDINGS: Patient rotation to the right. The cardiomediastinal silhouette is unchanged. Aortic atherosclerosis. Chronic elevation of the right hemidiaphragm. Ill-defined opacities within the bilateral lung bases, similar to the prior examination of 06/30/2022. No evidence of pleural effusion or pneumothorax. No acute bony abnormality identified. Degenerative changes of the spine. IMPRESSION: Ill-defined opacities within the bilateral lung bases, similar to the prior examination of 06/30/2022. Findings may reflect atelectasis and/or pneumonia. Chronic elevation of the right hemidiaphragm. Aortic Atherosclerosis (ICD10-I70.0).  Electronically Signed   By: Kellie Simmering D.O.   On: 07/03/2022 08:08   DG Chest Port 1 View  Result Date: 06/30/2022 CLINICAL DATA:  Altered mental status, evaluate for sepsis. EXAM: PORTABLE CHEST 1 VIEW COMPARISON:  Chest x-ray June 21, 2022 FINDINGS: The cardiomediastinal silhouette is unchanged in contour. There is patchy heterogeneous opacities in bibasilar lungs. No pleural effusion or pneumothorax. The visualized upper abdomen is unremarkable. No acute osseous abnormality. IMPRESSION: Patchy heterogeneous opacities in bibasilar lungs, raising concern for multifocal infection. Electronically Signed   By: Beryle Flock M.D.   On: 06/30/2022 16:17   DG Abd 1 View  Result Date: 06/27/2022 CLINICAL DATA:  Diarrhea. EXAM: ABDOMEN - 1 VIEW COMPARISON:  Abdominal radiograph dated 06/15/2022. FINDINGS: Evaluation is limited due to body habitus. No bowel dilatation or evidence of obstruction. No obvious free air or radiopaque calculi. Right upper quadrant cholecystectomy clips. Degenerative changes of the spine. No acute osseous pathology. IMPRESSION: Nonobstructive bowel gas pattern. Electronically Signed   By: Anner Crete M.D.   On: 06/27/2022 17:46   US Venous Img Lower Bilateral (DVT)  Result Date: 06/21/2022 EXAM: BILATERAL LOWER EXTREMITY VENOUS DOPPLER ULTRASOUND TECHNIQUE: Gray-scale sonography with graded compression, as well as color Doppler and duplex ultrasound were performed to evaluate the lower extremity deep venous systems from the level of the common femoral vein and including the common femoral, femoral, profunda femoral, popliteal and calf veins including the posterior tibial, peroneal and gastrocnemius veins when visible. The superficial great saphenous vein was also interrogated. Spectral Doppler was utilized to evaluate flow at rest and with distal augmentation maneuvers in the common femoral, femoral and popliteal veins. COMPARISON:  None Available. FINDINGS: RIGHT LOWER  EXTREMITY Common Femoral Vein: No evidence of thrombus. Normal compressibility, respiratory phasicity and response to augmentation. Saphenofemoral Junction: No evidence of thrombus. Normal compressibility and flow on color Doppler imaging. Profunda Femoral Vein: No evidence of thrombus.  Normal compressibility and flow on color Doppler imaging. Femoral Vein: No evidence of thrombus. Normal compressibility, respiratory phasicity and response to augmentation. Popliteal Vein: No evidence of thrombus. Normal compressibility, respiratory phasicity and response to augmentation. Calf Veins: No evidence of thrombus. Normal flow on color Doppler imaging. LEFT LOWER EXTREMITY Common Femoral Vein: No evidence of thrombus. Normal compressibility, respiratory phasicity and response to augmentation. Saphenofemoral Junction: No evidence of thrombus. Normal compressibility and flow on color Doppler imaging. Profunda Femoral Vein: No evidence of thrombus. Normal compressibility and flow on color Doppler imaging. Femoral Vein: No evidence of thrombus. Normal compressibility, respiratory phasicity and response to augmentation. Popliteal Vein: No evidence of thrombus. Normal compressibility, respiratory phasicity and response to augmentation. Calf Veins: No evidence of thrombus. Normal flow on color Doppler imaging. IMPRESSION: No evidence of deep venous thrombosis in either lower extremity. Electronically Signed   By: Albin Felling M.D.   On: 06/21/2022 13:23   DG Chest Port 1 View  Result Date: 06/21/2022 CLINICAL DATA:  Acute respiratory failure with hypoxia EXAM: PORTABLE CHEST 1 VIEW COMPARISON:  Yesterday FINDINGS: Left IJ line with tip at the SVC. Improved aeration at the bases but there is still right more than left airspace opacity with elevated right diaphragm. Stable cardiomegaly. No pneumothorax. IMPRESSION: Improved lower lobe infiltrates. Electronically Signed   By: Jorje Guild M.D.   On: 06/21/2022 07:45   DG  Chest Port 1 View  Result Date: 06/20/2022 CLINICAL DATA:  Acute on chronic respiratory failure with hypoxia and hypercapnia (HCC) EXAM: PORTABLE CHEST 1 VIEW COMPARISON:  Chest x-ray 06/17/2022. FINDINGS: Bibasilar opacities. Possible small right pleural effusion. No visible pneumothorax. Similar position of a left IJ approach central venous catheter. Polyarticular degenerative change. Unchanged cardiomediastinal silhouette, accentuated by technique. IMPRESSION: 1. Bibasilar opacities, which could represent atelectasis, aspiration, and/or pneumonia. 2. Possible small right pleural effusion. Electronically Signed   By: Margaretha Sheffield M.D.   On: 06/20/2022 08:16   ECHOCARDIOGRAM COMPLETE  Result Date: 06/18/2022    ECHOCARDIOGRAM REPORT   Patient Name:   TONNIA MCELROY Date of Exam: 06/12/2022 Medical Rec #:  [id number]        Height:       64.0 in Accession #:    [id number]       Weight:       349.0 lb Date of Birth:  [birth year]       BSA:          2.479 m Patient Age:    60 years         BP:           107/53 mmHg Patient Gender: F                HR:           65 bpm. Exam Location:  [facility] Procedure: 2D Echo, Cardiac Doppler and Color Doppler Indications:     R06.03 Acute Respiratory Distress  History:         Patient has prior history of Echocardiogram examinations, most                  recent 07/16/2021. Stroke and COPD; Signs/Symptoms:Chest Pain.                  Obstructive sleep apnea. Pneumonia.  Sonographer:     Cresenciano Lick RDCS Referring Phys:  [id number] [place] Diagnosing Phys: [place]  1. Left ventricular ejection fraction, by estimation, is 60 to  65%. The left ventricle has normal function. The left ventricle has no regional wall motion abnormalities. Left ventricular diastolic parameters are consistent with Grade III diastolic dysfunction (restrictive).  2. Right ventricular systolic function is normal. The right ventricular size is normal.  3. Left atrial size was  mildly dilated.  4. Right atrial size was mildly dilated.  5. The mitral valve is normal in structure. Mild to moderate mitral valve regurgitation. No evidence of mitral stenosis.  6. The aortic valve is normal in structure. Aortic valve regurgitation is not visualized. No aortic stenosis is present.  7. The inferior vena cava is normal in size with greater than 50% respiratory variability, suggesting right atrial pressure of 3 mmHg. FINDINGS  Left Ventricle: Left ventricular ejection fraction, by estimation, is 60 to 65%. The left ventricle has normal function. The left ventricle has no regional wall motion abnormalities. The left ventricular internal cavity size was normal in size. There is  no left ventricular hypertrophy. Left ventricular diastolic parameters are consistent with Grade III diastolic dysfunction (restrictive). Right Ventricle: The right ventricular size is normal. No increase in right ventricular wall thickness. Right ventricular systolic function is normal. Left Atrium: Left atrial size was mildly dilated. Right Atrium: Right atrial size was mildly dilated. Pericardium: Trivial pericardial effusion is present. The pericardial effusion is circumferential. Mitral Valve: The mitral valve is normal in structure. Mild to moderate mitral valve regurgitation. No evidence of mitral valve stenosis. Tricuspid Valve: The tricuspid valve is normal in structure. Tricuspid valve regurgitation is mild . No evidence of tricuspid stenosis. Aortic Valve: The aortic valve is normal in structure. Aortic valve regurgitation is not visualized. No aortic stenosis is present. Pulmonic Valve: The pulmonic valve was normal in structure. Pulmonic valve regurgitation is trivial. No evidence of pulmonic stenosis. Aorta: The aortic root is normal in size and structure. Venous: The inferior vena cava is normal in size with greater than 50% respiratory variability, suggesting right atrial pressure of 3 mmHg. IAS/Shunts: No  atrial level shunt detected by color flow Doppler.  LEFT VENTRICLE PLAX 2D LVIDd:         5.10 cm   Diastology LVIDs:         3.40 cm   LV e' medial:    5.87 cm/s LV PW:         1.10 cm   LV E/e' medial:  18.2 LV IVS:        1.10 cm   LV e' lateral:   6.74 cm/s LVOT diam:     1.90 cm   LV E/e' lateral: 15.9 LV SV:         65 LV SV Index:   26 LVOT Area:     2.84 cm  RIGHT VENTRICLE             IVC RV Basal diam:  3.90 cm     IVC diam: 1.60 cm RV S prime:     10.80 cm/s TAPSE (M-mode): 3.3 cm LEFT ATRIUM             Index        RIGHT ATRIUM           Index LA diam:        4.50 cm 1.82 cm/m   RA Area:     13.80 cm LA Vol (A2C):   56.2 ml 22.67 ml/m  RA Volume:   39.80 ml  16.06 ml/m LA Vol (A4C):   45.5 ml 18.36 ml/m LA Biplane  Vol: 55.0 ml 22.19 ml/m  AORTIC VALVE LVOT Vmax:   94.00 cm/s LVOT Vmean:  64.500 cm/s LVOT VTI:    0.228 m  AORTA Ao Root diam: 3.40 cm MITRAL VALVE                TRICUSPID VALVE MV Area (PHT): 3.81 cm     TR Peak grad:   25.4 mmHg MV Decel Time: 199 msec     TR Vmax:        252.00 cm/s MV E velocity: 107.00 cm/s MV A velocity: 133.00 cm/s  SHUNTS MV E/A ratio:  0.80         Systemic VTI:  0.23 m                             Systemic Diam: 1.90 cm Neoma Laming Electronically signed by Neoma Laming Signature Date/Time: 06/18/2022/9:56:43 AM    Final    DG Chest Port 1 View  Result Date: 06/17/2022 CLINICAL DATA:  [id number] with acute on chronic respiratory failure, hypoxia and hypercapnia. EXAM: PORTABLE CHEST 1 VIEW COMPARISON:  Portable chest yesterday at 5:04 a.m. FINDINGS: 4:43 a.m. ETT/NGT appear adequately placed. Esophageal temperature probe in the midesophagus is similar as well as left IJ line with tip in the distal SVC. There is mild cardiomegaly, mild central vascular prominence without overt edema. There is patchy airspace disease in the lower lung fields, denser on the left with small pleural effusions. The upper lung fields are clear with overall aeration unchanged. No  new abnormality. The mediastinum is stable with aortic tortuosity and calcification. Moderate thoracic spondylosis. IMPRESSION: 1. No significant change since yesterday's study. Patchy airspace disease in the lower lung fields, denser on the left with small pleural effusions. 2. Stable cardiomegaly and mild central vascular prominence. Electronically Signed   By: Telford Nab M.D.   On: 06/17/2022 06:47   DG Chest Port 1 View  Result Date: 06/16/2022 CLINICAL DATA:  Acute on chronic respiratory failure. EXAM: PORTABLE CHEST 1 VIEW COMPARISON:  Chest x-ray June 15, 2022 FINDINGS: The ETT and left central line are stable, in good position. An NG tube terminates below today's film. No pneumothorax. Patchy opacity in the right mid lower lung is similar in the interval. Patchy opacity on the left is improved. Persistent opacity in the left base. No change in the cardiomediastinal silhouette. IMPRESSION: 1. Support apparatus as above. 2. Patchy opacities in the right mid and lower lung are similar in the interval. 3. Patchy opacities in the left base have improved. Electronically Signed   By: Dorise Bullion III M.D.   On: 06/16/2022 09:00   DG Abd 1 View  Result Date: 06/15/2022 CLINICAL DATA:  Orogastric tube placement EXAM: ABDOMEN - 1 VIEW COMPARISON:  05/14/2022 FINDINGS: Limited radiograph of the lower chest and upper abdomen was obtained for the purposes of enteric tube localization. Enteric tube is seen coursing below the diaphragm with distal tip and side port terminating within the expected location of the gastric body. IMPRESSION: Enteric tube terminates within the gastric body. Electronically Signed   By: Davina Poke D.O.   On: 06/15/2022 09:14   DG Chest Port 1 View  Result Date: 06/15/2022 CLINICAL DATA:  Intubated EXAM: PORTABLE CHEST 1 VIEW COMPARISON:  06/15/2022 at 0507 hours FINDINGS: Endotracheal tube has been slightly retracted, now terminating 2.6 cm above the carina. Enteric  tube courses below the diaphragm with distal tip beyond the  inferior margin of the film. Esophageal temperature probe is seen. Stable left IJ central venous catheter. Stable cardiomegaly. Perihilar airspace opacities, not significantly changed. Probable small bilateral pleural effusions. No pneumothorax. IMPRESSION: 1. Endotracheal tube has been slightly retracted, now terminating 2.6 cm above the carina. 2. Otherwise no significant interval change. Electronically Signed   By: Davina Poke D.O.   On: 06/15/2022 09:14   DG Chest Port 1 View  Result Date: 06/15/2022 CLINICAL DATA:  Ventilator dependent respiratory failure. [id number]. Hypoxia and hypercapnia. EXAM: PORTABLE CHEST 1 VIEW COMPARISON:  Portable chest yesterday at 12:11 p.m. FINDINGS: 5:11 a.m. ETT has been advanced to 2 cm from the carina. Esophageal temperature probe remains in the upper thoracic esophagus. NGT terminates in the gastric antrum based on the position of the side hole with the tip not filmed. Right IJ central line tip is in the distal SVC. Stable cardiomegaly and mediastinum. Aortic atherosclerosis. Central vascular prominence is unchanged. Perihilar opacities extending out into the mid to lower lung fields are again noted and could be due to edema, pneumonia or combination. Small pleural effusions appear similar with no new or worsening lung opacities. Lung apices remain clear. IMPRESSION: 1. ETT has been advanced to 2 cm from the carina. 2. No other interval change. Stable perihilar opacities and small pleural effusions. 3. Stable cardiomegaly and central vascular prominence. Electronically Signed   By: Telford Nab M.D.   On: 06/15/2022 06:51   EEG adult  Result Date: 06/14/2022 Greta Doom, MD     06/14/2022  6:55 PM History: 72 yo F with encephalopathy Sedation: none Technique: This EEG was acquired with electrodes placed according to the International 10-20 electrode system (including Fp1, Fp2, F3, F4, C3, C4,  P3, P4, O1, O2, T3, T4, T5, T6, A1, A2, Fz, Cz, Pz). The following electrodes were missing or displaced: none. Background: The background consists of generalized irregular slow activity, predominantly delta range with some intermixed theta. There was no evolution, rhythmicity, or other concerning features. There was no PDR seen. There was no epileptiform activity seen. Photic stimulation: Physiologic driving is not performed EEG Abnormalities: 1) Generalized irregular slow activity 2) Absent PDR Clinical Interpretation: This normal EEG is consistent with a generalized non-specific cerebral dysfunction(encephalopathy). There was no seizure or seizure predisposition recorded on this study. Please note that lack of epileptiform activity on EEG does not preclude the possibility of epilepsy. Roland Rack, MD [facility] [phone] If 7pm- 7am, please page neurology on call as listed in [place].   DG Chest Port 1 View  Result Date: 06/14/2022 CLINICAL DATA:  Hypoxia. EXAM: PORTABLE CHEST 1 VIEW COMPARISON:  June 13, 2022. FINDINGS: Stable cardiomediastinal silhouette. Endotracheal and nasogastric tubes are unchanged in position. Left internal jugular catheter is noted with tip in expected position of the SVC. Stable bilateral lung opacities consistent with edema, pneumonia or atelectasis. Bony thorax is unremarkable. IMPRESSION: Stable support apparatus.  Stable bilateral lung opacities. Electronically Signed   By: Marijo Conception M.D.   On: 06/14/2022 12:28   DG Chest 1 View  Result Date: 06/13/2022 CLINICAL DATA:  [id number] Endotracheally intubated [id number] EXAM: CHEST  1 VIEW COMPARISON:  Chest x-ray 06/13/2022 6:18 p.m. FINDINGS: Endotracheal tube with tip 2 cm above the carina. Left internal jugular central venous catheter with tip overlying the expected region of the superior cavoatrial junction. Enteric tube coursing below the hemidiaphragm with tip and side port collimated off view. The  heart and mediastinal contours are unchanged. Aortic calcification. Low  lung volumes. Diffuse patchy airspace opacities. No pulmonary edema. At least bilateral trace pleural effusions.  No pneumothorax. No acute osseous abnormality. IMPRESSION: 1. Lines and tubes as above. 2. Low lung volumes with diffuse patchy airspace opacities. Followup PA and lateral chest X-ray is recommended in 3-4 weeks following therapy to ensure resolution and exclude underlying malignancy. 3. At least bilateral trace pleural effusions. Electronically Signed   By: Iven Finn M.D.   On: 06/13/2022 23:37   DG Chest 1 View  Result Date: 06/13/2022 CLINICAL DATA:  Central line EXAM: CHEST  1 VIEW COMPARISON:  Chest x-ray 06/13/2022 FINDINGS: There is an endotracheal tube with distal tip 3.1 cm above the carina. Enteric tube extends into the stomach. There is a new left-sided central venous catheter with distal tip projecting over the distal SVC. The heart is enlarged, unchanged. Bilateral multifocal mid and lower lung airspace opacities are similar to prior. There is a stable small left pleural effusion. There is no pneumothorax or acute fracture. IMPRESSION: 1. New left-sided central venous catheter with distal tip projecting over the distal SVC. No pneumothorax. 2. Stable bilateral multifocal airspace opacities and small left pleural effusion. 3. Stable cardiomegaly. Electronically Signed   By: Ronney Asters M.D.   On: 06/13/2022 18:34   CT HEAD WO CONTRAST (5MM)  Result Date: 06/13/2022 CLINICAL DATA:  Altered mental status EXAM: CT HEAD WITHOUT CONTRAST TECHNIQUE: Contiguous axial images were obtained from the base of the skull through the vertex without intravenous contrast. RADIATION DOSE REDUCTION: This exam was performed according to the departmental dose-optimization program which includes automated exposure control, adjustment of the mA and/or kV according to patient size and/or use of iterative reconstruction technique.  COMPARISON:  CT Head 04/30/22 FINDINGS: Brain: No evidence of acute infarction, hemorrhage, hydrocephalus, extra-axial collection or mass lesion/mass effect. Vascular: No hyperdense vessel or unexpected calcification. Skull: Normal. Negative for fracture or focal lesion. Sinuses/Orbits: No acute finding. Other: None IMPRESSION: No acute intracranial abnormality. Electronically Signed   By: Marin Roberts M.D.   On: 06/13/2022 17:29   DG Chest Port 1 View  Result Date: 06/13/2022 CLINICAL DATA:  Endotracheal tube. EXAM: PORTABLE CHEST 1 VIEW COMPARISON:  Same day. FINDINGS: Endotracheal and nasogastric tubes are in grossly good position. Stable bilateral lung opacities. IMPRESSION: Endotracheal and nasogastric tubes are in grossly good position. Electronically Signed   By: Marijo Conception M.D.   On: 06/13/2022 13:51   DG Chest Port 1 View  Result Date: 06/13/2022 CLINICAL DATA:  Respiratory failure, hypoxia EXAM: PORTABLE CHEST 1 VIEW COMPARISON:  06/08/2022 FINDINGS: The heart size and mediastinal worsening diffuse interstitial and alveolar opacities consistent with pulmonary edema or bilateral pneumonia. Elevated right hemidiaphragm. Possible small pleural effusions. No pneumothorax. Calcified aorta. Thoracic degenerative changes. IMPRESSION: Worsening diffuse interstitial and alveolar process most likely pulmonary edema versus extensive bilateral pneumonia. Electronically Signed   By: Sammie Bench M.D.   On: 06/13/2022 08:40   US RENAL  Result Date: 06/12/2022 CLINICAL DATA:  Acute renal failure EXAM: RENAL / URINARY TRACT ULTRASOUND COMPLETE COMPARISON:  CT chest abdomen and pelvis 06/08/2022 FINDINGS: Right Kidney: Renal measurements: 9.7 x 4.3 x 3.8 cm = volume: 83 mL. Echogenicity is within normal limits. No mass or hydronephrosis visualized. Left Kidney: Renal measurements: 8.9 x 5.0 x 4.4 cm = volume: 103 mL. Echogenicity is within normal limits. No mass or hydronephrosis visualized. Bladder:  Appears normal for degree of bladder distention. Other: Trace free fluid in the pelvis. IMPRESSION: 1. No  hydronephrosis. 2. Trace free fluid in the pelvis. Electronically Signed   By: Ronney Asters M.D.   On: 06/12/2022 18:06    Microbiology: Results for orders placed or performed during the hospital encounter of 06/08/22  Blood Culture (routine x 2)     Status: None   Collection Time: 06/08/22  7:18 PM   Specimen: BLOOD  Result Value Ref Range Status   Specimen Description BLOOD BLOOD LEFT ARM  Final   Special Requests   Final    BOTTLES DRAWN AEROBIC AND ANAEROBIC Blood Culture adequate volume   Culture   Final    NO GROWTH 5 DAYS Performed at [facility], [address] [place], [place] [address]    Report Status 06/13/2022 FINAL  Final  Blood Culture (routine x 2)     Status: None   Collection Time: 06/08/22  7:18 PM   Specimen: BLOOD  Result Value Ref Range Status   Specimen Description BLOOD BLOOD RIGHT ARM  Final   Special Requests   Final    BOTTLES DRAWN AEROBIC AND ANAEROBIC Blood Culture adequate volume   Culture   Final    NO GROWTH 5 DAYS Performed at [facility], [address] [place], [place] [address]    Report Status 06/13/2022 FINAL  Final  Resp panel by RT-PCR (RSV, Flu A&B, Covid) Anterior Nasal Swab     Status: None   Collection Time: 06/08/22  7:18 PM   Specimen: Anterior Nasal Swab  Result Value Ref Range Status   SARS Coronavirus 2 by RT PCR NEGATIVE NEGATIVE Final    Comment: (NOTE) SARS-CoV-2 target nucleic acids are NOT DETECTED.  The SARS-CoV-2 RNA is generally detectable in upper respiratory specimens during the acute phase of infection. The lowest concentration of SARS-CoV-2 viral copies this assay can detect is 138 copies/mL. A negative result does not preclude SARS-Cov-2 infection and should not be used as the sole basis for treatment or other patient management decisions. A negative result may occur with  improper  specimen collection/handling, submission of specimen other than nasopharyngeal swab, presence of viral mutation(s) within the areas targeted by this assay, and inadequate number of viral copies(<138 copies/mL). A negative result must be combined with clinical observations, patient history, and epidemiological information. The expected result is Negative.  Fact Sheet for Patients:  EntrepreneurPulse.com.au  Fact Sheet for Healthcare Providers:  IncredibleEmployment.be  This test is no t yet approved or cleared by the [place] FDA and  has been authorized for detection and/or diagnosis of SARS-CoV-2 by FDA under an Emergency Use Authorization (EUA). This EUA will remain  in effect (meaning this test can be used) for the duration of the COVID-19 declaration under Section 564(b)(1) of the Act, 21 U.S.C.section 360bbb-3(b)(1), unless the authorization is terminated  or revoked sooner.       Influenza A by PCR NEGATIVE NEGATIVE Final   Influenza B by PCR NEGATIVE NEGATIVE Final    Comment: (NOTE) The Xpert Xpress SARS-CoV-2/FLU/RSV plus assay is intended as an aid in the diagnosis of influenza from Nasopharyngeal swab specimens and should not be used as a sole basis for treatment. Nasal washings and aspirates are unacceptable for Xpert Xpress SARS-CoV-2/FLU/RSV testing.  Fact Sheet for Patients: EntrepreneurPulse.com.au  Fact Sheet for Healthcare Providers: IncredibleEmployment.be  This test is not yet approved or cleared by the [place] FDA and has been authorized for detection and/or diagnosis of SARS-CoV-2 by FDA under an Emergency Use Authorization (EUA). This EUA will remain  in effect (meaning this test can be used) for the duration of the COVID-19 declaration under Section 564(b)(1) of the Act, 21 U.S.C. section 360bbb-3(b)(1), unless the authorization is terminated or revoked.     Resp  Syncytial Virus by PCR NEGATIVE NEGATIVE Final    Comment: (NOTE) Fact Sheet for Patients: EntrepreneurPulse.com.au  Fact Sheet for Healthcare Providers: IncredibleEmployment.be  This test is not yet approved or cleared by the [place] FDA and has been authorized for detection and/or diagnosis of SARS-CoV-2 by FDA under an Emergency Use Authorization (EUA). This EUA will remain in effect (meaning this test can be used) for the duration of the COVID-19 declaration under Section 564(b)(1) of the Act, 21 U.S.C. section 360bbb-3(b)(1), unless the authorization is terminated or revoked.  Performed at [facility], [address], [place], [place] [address]   Urine Culture     Status: None   Collection Time: 06/08/22  9:34 PM   Specimen: In/Out Cath Urine  Result Value Ref Range Status   Specimen Description   Final    IN/OUT CATH URINE Performed at [facility], [address] [place], [place] [address]    Special Requests   Final    NONE Performed at [facility], [address], [place], [place] [address]    Culture   Final    NO GROWTH Performed at [facility], [place] [address] [place], [place] [address]    Report Status 06/10/2022 FINAL  Final  MRSA Next Gen by PCR, Nasal     Status: None   Collection Time: 06/10/22 11:49 PM   Specimen: Nasal Mucosa; Nasal Swab  Result Value Ref Range Status   MRSA by PCR Next Gen NOT DETECTED NOT DETECTED Final    Comment: (NOTE) The GeneXpert MRSA Assay (FDA approved for NASAL specimens only), is one component of a comprehensive MRSA colonization surveillance program. It is not intended to diagnose MRSA infection nor to guide or monitor treatment for MRSA infections. Test performance is not FDA approved in patients less than 68 years old. Performed at [facility], [address] [place], [place] [address]   Respiratory (~20 pathogens) panel by  PCR     Status: None   Collection Time: 06/12/22  6:15 AM   Specimen: Nasopharyngeal Swab; Respiratory  Result Value Ref Range Status   Adenovirus NOT DETECTED NOT DETECTED Final   Coronavirus 229E NOT DETECTED NOT DETECTED Final    Comment: (NOTE) The Coronavirus on the Respiratory Panel, DOES NOT test for the novel  Coronavirus (2019 nCoV)    Coronavirus HKU1 NOT DETECTED NOT DETECTED Final   Coronavirus NL63 NOT DETECTED NOT DETECTED Final   Coronavirus OC43 NOT DETECTED NOT DETECTED Final   Metapneumovirus NOT DETECTED NOT DETECTED Final   Rhinovirus / Enterovirus NOT DETECTED NOT DETECTED Final   Influenza A NOT DETECTED NOT DETECTED Final   Influenza B NOT DETECTED NOT DETECTED Final   Parainfluenza Virus 1 NOT DETECTED NOT DETECTED Final   Parainfluenza Virus 2 NOT DETECTED NOT DETECTED Final   Parainfluenza Virus 3 NOT DETECTED NOT DETECTED Final   Parainfluenza Virus 4 NOT DETECTED NOT DETECTED Final   Respiratory Syncytial Virus NOT DETECTED NOT DETECTED Final   Bordetella pertussis NOT DETECTED NOT DETECTED Final   Bordetella Parapertussis NOT DETECTED NOT DETECTED Final   Chlamydophila pneumoniae NOT DETECTED NOT DETECTED Final   Mycoplasma pneumoniae NOT DETECTED NOT DETECTED Final    Comment: Performed at [facility], [address], [place], [place] [address]  Culture, Respiratory w Gram Stain     Status: None   Collection Time: 06/17/22  8:15 AM   Specimen: Tracheal Aspirate; Respiratory  Result Value Ref Range Status   Specimen Description   Final    TRACHEAL ASPIRATE Performed at [facility], [address] [place], [place] [address]    Special Requests   Final    NONE Performed at [facility], [address] [place], [place] [address]    Gram Stain   Final    RARE WBC PRESENT, PREDOMINANTLY PMN RARE SQUAMOUS EPITHELIAL CELLS PRESENT FEW YEAST Performed at [facility], [place] [address] [place], [place] [address]     Culture FEW CANDIDA ALBICANS  Final   Report Status 06/19/2022 FINAL  Final  Gastrointestinal Panel by PCR , Stool     Status: None   Collection Time: 06/27/22  4:34 PM   Specimen: Stool  Result Value Ref Range Status   Campylobacter species NOT DETECTED NOT DETECTED Final   Plesimonas shigelloides NOT DETECTED NOT DETECTED Final   Salmonella species NOT DETECTED NOT DETECTED Final   Yersinia enterocolitica NOT DETECTED NOT DETECTED Final   Vibrio species NOT DETECTED NOT DETECTED Final   Vibrio cholerae NOT DETECTED NOT DETECTED Final   Enteroaggregative E coli (EAEC) NOT DETECTED NOT DETECTED Final   Enteropathogenic E coli (EPEC) NOT DETECTED NOT DETECTED Final   Enterotoxigenic E coli (ETEC) NOT DETECTED NOT DETECTED Final   Shiga like toxin producing E coli (STEC) NOT DETECTED NOT DETECTED Final   Shigella/Enteroinvasive E coli (EIEC) NOT DETECTED NOT DETECTED Final   Cryptosporidium NOT DETECTED NOT DETECTED Final   Cyclospora cayetanensis NOT DETECTED NOT DETECTED Final   Entamoeba histolytica NOT DETECTED NOT DETECTED Final   Giardia lamblia NOT DETECTED NOT DETECTED Final   Adenovirus F40/41 NOT DETECTED NOT DETECTED Final   Astrovirus NOT DETECTED NOT DETECTED Final   Norovirus GI/GII NOT DETECTED NOT DETECTED Final   Rotavirus A NOT DETECTED NOT DETECTED Final   Sapovirus (I, II, IV, and V) NOT DETECTED NOT DETECTED Final    Comment: Performed at [facility], [address] [place], [place] [address]  Urine Culture (for pregnant, neutropenic or urologic patients or patients with an indwelling urinary catheter)     Status: Abnormal   Collection Time: 07/01/22 10:00 AM   Specimen: Urine, Catheterized  Result Value Ref Range Status   Specimen Description   Final    URINE, CATHETERIZED Performed at [facility], [address] [place], [place] [address]    Special Requests   Final    NONE Performed at [facility], [address]  [place], [place] [address]    Culture >=100,000 COLONIES/mL YEAST (A)  Final   Report Status 07/02/2022 FINAL  Final  Culture, blood (Routine X 2) w Reflex to ID Panel     Status: Abnormal   Collection Time: 07/01/22 10:47 AM   Specimen: BLOOD RIGHT ARM  Result Value Ref Range Status   Specimen Description   Final    BLOOD RIGHT ARM Performed at [facility], [address], [place], [place] [address]    Special Requests   Final    BOTTLES DRAWN AEROBIC AND ANAEROBIC Blood Culture adequate volume Performed at [facility], [address], [place], [place] [address]    Culture  Setup Time   Final    [facility] POSITIVE COCCI  AEROBIC BOTTLE ONLY Organism ID to follow CRITICAL RESULT CALLED TO, READ BACK BY AND VERIFIED WITHPamelia Hoit PHARMD 1130 07/02/22 HNM Performed at [facility], [address] [place], [place] [address]    Culture (A)  Final    STAPHYLOCOCCUS EPIDERMIDIS THE SIGNIFICANCE OF ISOLATING THIS ORGANISM FROM A SINGLE SET OF BLOOD CULTURES WHEN MULTIPLE SETS ARE DRAWN IS UNCERTAIN. PLEASE NOTIFY THE MICROBIOLOGY DEPARTMENT WITHIN ONE WEEK IF SPECIATION AND SENSITIVITIES ARE REQUIRED. Performed at [facility], [place] [address] [place], [place] [address]    Report Status 07/04/2022 FINAL  Final  Culture, blood (Routine X 2) w Reflex to ID Panel     Status: None   Collection Time: 07/01/22 10:47 AM   Specimen: BLOOD LEFT ARM  Result Value Ref Range Status   Specimen Description BLOOD LEFT ARM  Final   Special Requests   Final    BOTTLES DRAWN AEROBIC AND ANAEROBIC Blood Culture adequate volume   Culture   Final    NO GROWTH 5 DAYS Performed at [facility], [address] [place], [place] [address]    Report Status 07/06/2022 FINAL  Final  Blood Culture ID Panel (Reflexed)     Status: Abnormal   Collection Time: 07/01/22 10:47 AM  Result Value Ref Range Status   Enterococcus faecalis NOT DETECTED NOT DETECTED Final    Enterococcus Faecium NOT DETECTED NOT DETECTED Final   Listeria monocytogenes NOT DETECTED NOT DETECTED Final   Staphylococcus species DETECTED (A) NOT DETECTED Final    Comment: CRITICAL RESULT CALLED TO, READ BACK BY AND VERIFIED WITH: Pamelia Hoit PHARMD 1130 07/02/22 HNM    Staphylococcus aureus (BCID) NOT DETECTED NOT DETECTED Final   Staphylococcus epidermidis DETECTED (A) NOT DETECTED Final    Comment: Methicillin (oxacillin) resistant coagulase negative staphylococcus. Possible blood culture contaminant (unless isolated from more than one blood culture draw or clinical case suggests pathogenicity). No antibiotic treatment is indicated for blood  culture contaminants. CRITICAL RESULT CALLED TO, READ BACK BY AND VERIFIED WITH: Pamelia Hoit PHARMD 1130 07/02/22 HNM    Staphylococcus lugdunensis NOT DETECTED NOT DETECTED Final   Streptococcus species NOT DETECTED NOT DETECTED Final   Streptococcus agalactiae NOT DETECTED NOT DETECTED Final   Streptococcus pneumoniae NOT DETECTED NOT DETECTED Final   Streptococcus pyogenes NOT DETECTED NOT DETECTED Final   A.calcoaceticus-baumannii NOT DETECTED NOT DETECTED Final   Bacteroides fragilis NOT DETECTED NOT DETECTED Final   Enterobacterales NOT DETECTED NOT DETECTED Final   Enterobacter cloacae complex NOT DETECTED NOT DETECTED Final   Escherichia coli NOT DETECTED NOT DETECTED Final   Klebsiella aerogenes NOT DETECTED NOT DETECTED Final   Klebsiella oxytoca NOT DETECTED NOT DETECTED Final   Klebsiella pneumoniae NOT DETECTED NOT DETECTED Final   Proteus species NOT DETECTED NOT DETECTED Final   Salmonella species NOT DETECTED NOT DETECTED Final   Serratia marcescens NOT DETECTED NOT DETECTED Final   Haemophilus influenzae NOT DETECTED NOT DETECTED Final   Neisseria meningitidis NOT DETECTED NOT DETECTED Final   Pseudomonas aeruginosa NOT DETECTED NOT DETECTED Final   Stenotrophomonas maltophilia NOT DETECTED NOT DETECTED Final    Candida albicans NOT DETECTED NOT DETECTED Final   Candida auris NOT DETECTED NOT DETECTED Final   Candida glabrata NOT DETECTED NOT DETECTED Final   Candida krusei NOT DETECTED NOT DETECTED Final   Candida parapsilosis NOT DETECTED NOT DETECTED Final   Candida tropicalis NOT DETECTED NOT DETECTED Final   Cryptococcus neoformans/gattii NOT DETECTED NOT DETECTED  Final   Methicillin resistance mecA/C DETECTED (A) NOT DETECTED Final    Comment: CRITICAL RESULT CALLED TO, READ BACK BY AND VERIFIED WITHPamelia Hoit PHARMD 1130 07/02/22 HNM Performed at [facility], [address] [place], [place] [address]   Urine Culture     Status: None   Collection Time: 07/05/22  9:07 AM   Specimen: Urine, Clean Catch  Result Value Ref Range Status   Specimen Description   Final    URINE, CLEAN CATCH Performed at [facility], [place] [address] [place], [place] [address]    Special Requests   Final    NONE Reflexed from [id number] Performed at [facility], [address] [place], [place] [address]    Culture   Final    NO GROWTH Performed at [facility], [place] [address], [place], [place] [address]    Report Status 07/06/2022 FINAL  Final    Labs: CBC: Recent Labs  Lab 07/03/22 0400 07/07/22 0540  WBC 6.7 5.1  HGB 9.1* 9.1*  HCT 31.8* 32.6*  MCV 85.7 87.9  PLT 261 [id number]   Basic Metabolic Panel: Recent Labs  Lab 07/03/22 0400 07/04/22 0406 07/05/22 0229 07/06/22 0436 07/07/22 0540 07/08/22 0605 07/09/22 0444  NA 147* 148* 148* 141 136  --   --   K 3.7 3.3* 3.7 3.8 4.1  --   --   CL 107 107 108 102 99  --   --   CO2 30 30 32 30 30  --   --   GLUCOSE 92 109* 118* 106* 112*  --   --   BUN 45* 38* 32* 26* 25*  --   --   CREATININE 1.90* 1.51* 1.29* 1.17* 1.28*  --   --   CALCIUM 9.1 9.2 9.3 9.0 9.3  --   --   MG 1.6* 1.5* 1.9 1.6* 1.9 1.7 1.7   Liver Function Tests: No results for input(s): "AST", "ALT", "ALKPHOS", "BILITOT", "PROT", "ALBUMIN" in  the last 168 hours. CBG: No results for input(s): "GLUCAP" in the last 168 hours.  Discharge time spent: greater than 30 minutes.  Signed: Collier Bullock, MD [facility] 07/09/2022

## 2022-07-09 NOTE — Progress Notes (Addendum)
Manufacturing systems engineer Liaison Note     New referral for Inpatient Unit received from [place], SW TOC.  Met at the bedside with patient, son, Abe People and patient's daughter, Izora Gala (via phone).  Discussed hospice Medicare benefit, philosophy and services.  Discussed inpatient unit criteria and confirmed goals of care.  Izora Gala and [place] both endorse wanting relief of symptoms for their mother and want to proceed with inpatient unit referral.  Discussed case with Antonieta Loveless, hospice physician.  Patient approved for GIP level of care for symptom management of severe pain, shortness of breath and agitation.  Spoke with team at [facility] who is initiating medications as patient is yelling out in pain and in acute respiratory distress.    Bed offer made and accepted by patient's son, Abe People.  [place] team aware along with [name] Scrape, TOC who will complete EMS form.  MAR and discharge summary to be sent to hospice home by this RN once completed.  RN to call report to [phone].  Please ensure DNR accompanies patient to the Hospice Home.  EMS arranged for pick up at 1800 by this RN.  Thank you for allowing participation in this patient's care.  Dimas Aguas, RN Nurse Liaison [phone]

## 2022-07-09 NOTE — TOC Progression Note (Addendum)
Transition of Care [facility]) - Progression Note    Patient Details  Name: Samantha Mason MRN: [id number] Date of Birth: Dec 30, 1950  Transition of Care [facility]) CM/SW [place], LCSW Phone Number: 07/09/2022, 3:06 PM  Clinical Narrative:   Per palliative, son interested in the hospice home here in [place]. CSW met with son and confirmed. Made referral to D[facility].  3:56 pm: Transport paperwork and signed DNR are in the discharge packet in case [facility] can accept patient today.  Expected Discharge Plan: [place] Barriers to Discharge: Continued Medical Work up  Expected Discharge Plan and Services   Discharge Planning Services: CM Consult Post Acute Care Choice: [place] Living arrangements for the past 2 months: Single Family Home                 DME Arranged: N/A         HH Arranged: NA           Social Determinants of Health (SDOH) Interventions SDOH Screenings   Food Insecurity: No Food Insecurity (06/09/2022)  Housing: Low Risk  (06/09/2022)  Transportation Needs: No Transportation Needs (06/09/2022)  Utilities: Not At Risk (06/09/2022)  Alcohol Screen: Low Risk  (08/08/2021)  Depression (PHQ2-9): Medium Risk (02/25/2022)  Financial Resource Strain: Medium Risk (08/14/2021)  Physical Activity: Inactive (08/08/2021)  Social Connections: Socially Isolated (08/08/2021)  Stress: No Stress Concern Present (08/08/2021)  Tobacco Use: Low Risk  (06/11/2022)    Readmission Risk Interventions     No data to display

## 2022-07-09 NOTE — Progress Notes (Signed)
Nutrition Brief Note  Chart reviewed. Pt now transitioning to comfort care.  No further nutrition interventions planned at this time.  Please re-consult as needed.   Dmiya Malphrus W, RD, LDN, CDCES Registered Dietitian II Certified Diabetes Care and Education Specialist Please refer to [facility] for RD and/or RD on-call/weekend/after hours pager   

## 2022-07-09 NOTE — Progress Notes (Signed)
Progress Note   Patient: Samantha Mason [id number] DOB: 1951/03/12 DOA: 06/08/2022     31 DOS: the patient was seen and examined on 07/09/2022   Brief hospital course: 72 y.o. female with medical history significant for Morbid obesity, chronic HFpEF,CKD stage III, chronic lower extremity lymphedema on treatment with torsemide, hypertension, asthma, stroke (1995) and obstructive sleep apnea (non compliant with cpap) to ED w/ CC fatigue, LE swelling/redness. Pt was admitted. Course complicated by CO2 narcosis, failed BiPAP requiring intubation and mechanical ventilation.  Transferred to hospitalist service on 06/24/22. Treated for Acute on Chronic HFpEF. Her respiratory failure has been treated with BiPAP, diuresis, and bronchodilators. She has been refusing BiPAP now. Currently on 2L [place]. Diuresis held, Cr worsens despite this but has stabilized, nephrology following.    1/20: Presented to ED, admitted by [facility] with Sepsis in setting of lower extremity Cellulitis, AKI, and Acute on Chronic Hypercapnic Respiratory Failure in setting AECOPD.  1/21: Added cefepime, continue vancomycin, increase dose of Cardizem for better heart rate control 1/22: Had to reduce Cardizem as blood pressure dropped 1/23: Transferred to ICU overnight due to CO2 narcosis, placed on BiPAP.  PCCM consulted.  Worsening AKI, holding diuresis.  Agitation requiring Precedex.  Palliative Care consulted for [place].  ABX broadened back to Cefepime and Vancomycin 1/24: Pt on Bipap but awake and able to follow commands. Attempt to transition off Bipap.  1/25: Pt currently on Bipap and precedex gtt.  Required 2.5 mg iv valium overnight for delirium.  Unable to obtain CT Head due to respiratory status.  Ultimately required intubation. 1/26: Remains critically ill. Issues with vent dyssynchrony and ventilation, plan to start Nimbex gtt. Creatinine improved with diuresis, continue with diuresis, Nephrology considering Lasix gtt. 1/27: ABG's  improved on Nimbex, persistent cuff leak, exchanged ETT. Decadron started due to upper airway swelling. Will attempt to d/c Nimbex. Good diuresis with Lasix gtt with 4.5 L of UOP last 24 hrs, Creatinine improving. 1/28: Continues with good diuresis on Lasix gtt  1/29: No acute events overnight. Remained mechanically intubated with minimal vent settings.  Remains on lasix gtt.  Not on sedation and attempting to follow commands but extremely weak.   1/30: No acute events overnight.  Pt awake and following commands on precedex gtt.  Lasix gtt. Tolerating SBT 15/8 1/31: No acute events, awake and following commands on low dose precedex.  Remains on Lasix gtt,  Creatinine remains stable BUN slowly uptrending. Tolerating SBT 15/8 ~ will continue to wean PSV as tolerated ~ EXTUBATED. Lasix gtt d/c following extubation.  Following extubation, pt confirmed DNR/DNI status in presence of her son at bedside. 2/1: Tolerated BiPAP overnight, weaned to [facility] this am.  Speech evaluation performed, which pt FAILED. Consult PT/OT.  Creatinine continues to improve, started on D5W @ 50 ml/hr due to Hypernatremia of 155.  Holding diuresis today 2/2: Pt febrile on Bipap @50$ %.  Pt awake and following commands.  Failed speech evaluation on 02/1 and declining NGT placement.  2/2: Venous US BLE: No DVT 2/3: Agreeable to small bore NGT placement and dialysis if needed.  Again confirmed DNR/DNI  2/4: Nursing staff reporting pt now tolerating ice chips without signs of aspiration. NG was not needed.  2/5: back to hospitalist service. Creatinine uptrending.  2/6-2/09: Cr not improving but has been stable -  nephrology following, pt agreeable to temporary HD if needed, nephro recs continue to monitor UOP. No acute need for dialysis. Continue to hold diuretics. Liquid stool persist, GI PCR  negative, KUB no concerns.   02/10-02/11: Cr starting to improve some.  02/11 overnight insomnia, given Rx and sleep around 04:30, was a bit more  confused on AM rounds likely med effect + hypercarbia as she has been refusing BiPap - found hypercarbic on ABG and placed back on BiPap and transferred to Progressive unit. UA also concerning for UTI, CXR concerning for PNA. (+)Sepsis criteria  02/12: Cr thankfully improving. Still confused. Remains on BiPap. See IPAL note for discussion w/ son.  02/13: 1/4 BCx(+) likely contaminant. Urine growing yeast. BNP and Procal, neither remarkably elevated, will not administer diuresis but will repeat CXR in AM and d/c abx other than diflucan for yeast in urine. Remains hypercarbic and on BiPap, more alert today but still trying to pull off mask and requiring frequent redirection/sitter.  02/14: Very confused.  Has mittens on.  Continues to attempt to remove IV and had remove the BiPAP mask overnight.  Renal function continues to improve and had peaked at 3.69 it is down to 1.9 today. 2/15 : Patient seen and examined this morning.  Continues to stay confused.  Was on BiPAP overnight.  No acute overnight event reported by nursing.  Labs with hypokalemia and hypomagnesemia repleted..  Continues to stay on 2 L nasal cannula in the day.  Has completed treatment for pneumonia along threshold for reinitiating antibiotics if spikes fever. 2/16 : Patient continues to stay lethargic.  On nasal cannula 2 to 3 L.  Renal functions improving but worsening hypernatremia 148.  Patient started on D5 at 78.  Still deconditioned 2/17 patient seen and examined this morning improvement in mental status compared to yesterday.  However still lethargic.  Hyponatremia improving with sodium of 141 today continue with the patient on D5 at 75.  Magnesium repleted.  Diflucan continued for UTI/yeast infection.  As patient is lethargic ordered ABG   2/18 : Patient seen and examined this morning.  Mental status improved from yesterday.  ABG showed hypercapnia patient refusing BiPAP.  Labs with improving sodium however mild elevation of  creatinine.  Patient is extremely poor p.o. intake.  Discussed with family for possible PEG placement as patient had hard time with NG in the past.   2/19: Patient is seen and examined at the bedside.  She is more awake but still remains very confused.  Oral intake remains very poor.  Family meeting scheduled for a.m. to discuss goals of care   2/20: Patient is seen and examined at bedside.  Very lethargic.  ABG shows persistent hypercapnia.  Patient continues to refuse BiPAP.  Had extensive conversation with patient's son and palliative care also discussed with patient's son and he is agreeable to inpatient hospice  Assessment and Plan: Principal Problem:   Severe sepsis ([place]) Active Problems:   Acute on chronic respiratory failure with hypoxia and hypercapnia (HCC)   Chronic diastolic CHF (congestive heart failure) ([place])   Essential hypertension   Acute renal failure superimposed on stage 3b chronic kidney disease ([place])   Morbid obesity with BMI of 50.0-59.9, adult ([place])   OSA    History of stroke   Extreme obesity with alveolar hypoventilation (HCC)   Cellulitis   Acute metabolic encephalopathy   Community acquired pneumonia   Toxic Metabolic Encephalopathy likely CO2 narcosis -recurrent Also concern for multifocal pneumonia and UTI  Hypernatremia Patient has been noncompliant with BiPAP and continues to refuse resulted in persistent hypercapnia and mental status changes Oral intake remains very poor and patient appears dehydrated with  hypernatremia. Judicious IV fluid administration and may need alternative means of providing nutrition   Sepsis/SIRS w/ concern for Multifocal Pneumonia per CXR 02/11 Procalcitonin unremarkable  BNP unremarkable  Cefepime + vancomycin for multifocal pneumonia acquired in hospital setting --> d/c 02/13 but low threshold to restart if febrile or increased SOB   Sepsis d/t UTI per UA 02/11 UCx (+)yeast  DIflucan per pharmacy     Acute on Chronic  Kidney Injury likely secondary to severe infection/sepsis on admission, was improving then worse again, concern for volume contraction / diuresis effect - improving now Hypernatremia - resolved Contraction Alkalosis - resolved  holding diuretics Judicious IV fluid hydration management per nephrology - there was some concern about possible needing dialysis but she has been stable for past few days  Appreciate nephrology input Renal function continues to improve   Acute on Chronic Hypoxic and Hypercapnic Respiratory Failure D/t Acute on Chronic HFpEF initially, now concern for restrictive lung disease and multifocal pneumonia  supplemental O2 to keep sats >=90% when off BiPAP BiPap as above palliative care involved DNR/DNI  continue supportive care with bronchodilators Had an extensive discussion with patient's son and palliative care met with him as well and patient will be discharged to an inpatient hospice   Acute on Chronic HFpEF - improved Holding diuresis due to renal failure. Avoid aggressive IV fluids  BNP today unremarkable, monitor as needed    Anemia of Chronic Disease Monitor CBC Aranesp weekly    Hypokalemia monitor and replete PRN   Hypernatremia : Sodium 148-141-136 improving Secondary to poor oral intake.  Patient noted to have very dry mucous membranes Gentle IV fluid hydration with D5W to administer free water Sodium levels have improved Patient not a candidate for alternative means of feeding as she remains very confused and continues to pull out IVs and her BiPAP mask.  I am concerned that she may pull out the PEG tube as well                       Physical Exam: Vitals:   07/09/22 0054 07/09/22 0325 07/09/22 0458 07/09/22 0808  BP: (!) 136/57 (!) 129/49  (!) 126/38  Pulse: 88 72  81  Resp: 20 (!) 22  18  Temp: 98.8 F (37.1 C) 98.4 F (36.9 C)  98.7 F (37.1 C)  TempSrc: Axillary Oral    SpO2: 100% 100%  100%  Weight:   (!) 144.2 kg    Height:       Vitals and nursing note reviewed.  Constitutional:      Appearance: She is obese.     Comments: lethargic HENT:     Head: Normocephalic and atraumatic.     Nose: Nose normal.     Mouth/Throat:     Mouth: Mucous membranes are dry.  Eyes:     Comments: Pale conjunctiva  Cardiovascular:     Rate and Rhythm: Normal rate and regular rhythm.  Pulmonary:     Effort: Pulmonary effort is normal.     Breath sounds: Normal breath sounds.  Abdominal:     General: Bowel sounds are normal.     Palpations: Abdomen is soft.  Musculoskeletal:     Cervical back: Normal range of motion and neck supple.     Right lower leg: Edema present.     Left lower leg: Edema present.  Skin:    General: Skin is warm and dry.  Neurological:     Motor: Weakness present.  Psychiatric:     Comments: Unable to assess  Data Reviewed: pH 7.29, pCO2 79, pO2 107 There are no new results to review at this time.  Family Communication: Discussed at length with patient's son over the phone and at the bedside.  He also has a palliative care patient will be discharged to an inpatient hospice  Disposition: Status is: Inpatient Remains inpatient appropriate because: Awaiting discharge to inpatient hospice  Planned Discharge Destination:  Inpatient hospice    Time spent: 35 minutes  Author: Collier Bullock, MD 07/09/2022 4:27 PM  For on call review [email or web address].

## 2022-07-09 NOTE — Progress Notes (Signed)
Report called to hospice house [place].  Pt transported by EMS

## 2022-07-09 NOTE — Progress Notes (Signed)
Pt placed on bipap.  Pt yelling No and help during application.  Pt fighting nurses, has to have upper extremities held to avoid scratching and pushing.   Bipap applied.  As soon as patient is released by nurses, pt removes bipap.  Attempted three times.  Pt will not leave bipap on.

## 2022-07-09 NOTE — TOC Transition Note (Signed)
Transition of Care [facility]) - CM/SW Discharge Note   Patient Details  Name: Samantha Mason MRN: [id number] Date of Birth: 16-Oct-1950  Transition of [facility]) CM/SW Contact:  Candie Chroman, LCSW Phone Number: 07/09/2022, 4:40 PM   Clinical Narrative:  Patient will discharge to the [facility] today. RN will call report to [phone]. [facility] liaison will arrange EMS transport. No further concerns. CSW signing off.   Final next level of care: [place] Barriers to Discharge: Barriers Resolved   Patient Goals and CMS Choice CMS Medicare.gov Compare Post Acute Care list provided to:: Patient Choice offered to / list presented to : Adult Children  Discharge Placement                  Patient to be transferred to facility by: EMS   Patient and family notified of of transfer: 07/09/22  Discharge Plan and Services Additional resources added to the After Visit Summary for     Discharge Planning Services: CM Consult Post Acute Care Choice: [place]          DME Arranged: N/A         HH Arranged: NA          Social Determinants of Health (SDOH) Interventions SDOH Screenings   Food Insecurity: No Food Insecurity (06/09/2022)  Housing: Low Risk  (06/09/2022)  Transportation Needs: No Transportation Needs (06/09/2022)  Utilities: Not At Risk (06/09/2022)  Alcohol Screen: Low Risk  (08/08/2021)  Depression (PHQ2-9): Medium Risk (02/25/2022)  Financial Resource Strain: Medium Risk (08/14/2021)  Physical Activity: Inactive (08/08/2021)  Social Connections: Socially Isolated (08/08/2021)  Stress: No Stress Concern Present (08/08/2021)  Tobacco Use: Low Risk  (06/11/2022)     Readmission Risk Interventions     No data to display

## 2022-07-16 ENCOUNTER — Ambulatory Visit: Payer: Medicare Other | Admitting: Family

## 2022-07-19 DEATH — deceased
